# Patient Record
Sex: Female | Born: 1945
Health system: Southern US, Community
[De-identification: ages and names within clinical notes are randomized; demographics above are authoritative.]

## PROBLEM LIST (undated history)

## (undated) DIAGNOSIS — H9190 Unspecified hearing loss, unspecified ear: Secondary | ICD-10-CM

## (undated) DIAGNOSIS — G43009 Migraine without aura, not intractable, without status migrainosus: Secondary | ICD-10-CM

## (undated) DIAGNOSIS — N189 Chronic kidney disease, unspecified: Secondary | ICD-10-CM

## (undated) DIAGNOSIS — K635 Polyp of colon: Secondary | ICD-10-CM

## (undated) DIAGNOSIS — F17200 Nicotine dependence, unspecified, uncomplicated: Secondary | ICD-10-CM

## (undated) DIAGNOSIS — E785 Hyperlipidemia, unspecified: Secondary | ICD-10-CM

## (undated) DIAGNOSIS — R945 Abnormal results of liver function studies: Secondary | ICD-10-CM

## (undated) DIAGNOSIS — Z9889 Other specified postprocedural states: Secondary | ICD-10-CM

## (undated) DIAGNOSIS — Z95810 Presence of automatic (implantable) cardiac defibrillator: Secondary | ICD-10-CM

## (undated) DIAGNOSIS — F32A Depression, unspecified: Secondary | ICD-10-CM

## (undated) DIAGNOSIS — N289 Disorder of kidney and ureter, unspecified: Secondary | ICD-10-CM

## (undated) DIAGNOSIS — R112 Nausea with vomiting, unspecified: Secondary | ICD-10-CM

## (undated) DIAGNOSIS — K317 Polyp of stomach and duodenum: Secondary | ICD-10-CM

## (undated) DIAGNOSIS — I5022 Chronic systolic (congestive) heart failure: Secondary | ICD-10-CM

## (undated) DIAGNOSIS — J189 Pneumonia, unspecified organism: Secondary | ICD-10-CM

## (undated) DIAGNOSIS — D649 Anemia, unspecified: Secondary | ICD-10-CM

## (undated) DIAGNOSIS — H7090 Unspecified mastoiditis, unspecified ear: Secondary | ICD-10-CM

## (undated) DIAGNOSIS — R7303 Prediabetes: Secondary | ICD-10-CM

## (undated) DIAGNOSIS — J45909 Unspecified asthma, uncomplicated: Secondary | ICD-10-CM

## (undated) DIAGNOSIS — K219 Gastro-esophageal reflux disease without esophagitis: Secondary | ICD-10-CM

## (undated) DIAGNOSIS — J449 Chronic obstructive pulmonary disease, unspecified: Secondary | ICD-10-CM

## (undated) DIAGNOSIS — I7122 Aneurysm of the aortic arch, without rupture: Secondary | ICD-10-CM

## (undated) DIAGNOSIS — I712 Thoracic aortic aneurysm, without rupture: Secondary | ICD-10-CM

## (undated) DIAGNOSIS — Z9289 Personal history of other medical treatment: Secondary | ICD-10-CM

## (undated) DIAGNOSIS — I255 Ischemic cardiomyopathy: Secondary | ICD-10-CM

## (undated) DIAGNOSIS — R933 Abnormal findings on diagnostic imaging of other parts of digestive tract: Secondary | ICD-10-CM

## (undated) DIAGNOSIS — R51 Headache: Secondary | ICD-10-CM

## (undated) DIAGNOSIS — I10 Essential (primary) hypertension: Secondary | ICD-10-CM

## (undated) DIAGNOSIS — T8859XA Other complications of anesthesia, initial encounter: Secondary | ICD-10-CM

## (undated) DIAGNOSIS — I639 Cerebral infarction, unspecified: Secondary | ICD-10-CM

## (undated) DIAGNOSIS — K922 Gastrointestinal hemorrhage, unspecified: Secondary | ICD-10-CM

## (undated) DIAGNOSIS — R413 Other amnesia: Secondary | ICD-10-CM

## (undated) DIAGNOSIS — R0600 Dyspnea, unspecified: Secondary | ICD-10-CM

## (undated) DIAGNOSIS — K5730 Diverticulosis of large intestine without perforation or abscess without bleeding: Secondary | ICD-10-CM

## (undated) DIAGNOSIS — Z86711 Personal history of pulmonary embolism: Secondary | ICD-10-CM

## (undated) DIAGNOSIS — G4700 Insomnia, unspecified: Secondary | ICD-10-CM

## (undated) DIAGNOSIS — K589 Irritable bowel syndrome without diarrhea: Secondary | ICD-10-CM

## (undated) DIAGNOSIS — M1990 Unspecified osteoarthritis, unspecified site: Secondary | ICD-10-CM

## (undated) DIAGNOSIS — R1310 Dysphagia, unspecified: Secondary | ICD-10-CM

## (undated) DIAGNOSIS — Z86010 Personal history of colonic polyps: Secondary | ICD-10-CM

## (undated) DIAGNOSIS — I2510 Atherosclerotic heart disease of native coronary artery without angina pectoris: Secondary | ICD-10-CM

## (undated) DIAGNOSIS — T4145XA Adverse effect of unspecified anesthetic, initial encounter: Secondary | ICD-10-CM

## (undated) DIAGNOSIS — K5790 Diverticulosis of intestine, part unspecified, without perforation or abscess without bleeding: Secondary | ICD-10-CM

## (undated) DIAGNOSIS — I219 Acute myocardial infarction, unspecified: Secondary | ICD-10-CM

## (undated) DIAGNOSIS — D126 Benign neoplasm of colon, unspecified: Secondary | ICD-10-CM

## (undated) DIAGNOSIS — F329 Major depressive disorder, single episode, unspecified: Secondary | ICD-10-CM

## (undated) DIAGNOSIS — I2699 Other pulmonary embolism without acute cor pulmonale: Secondary | ICD-10-CM

## (undated) DIAGNOSIS — R1032 Left lower quadrant pain: Secondary | ICD-10-CM

## (undated) DIAGNOSIS — M5136 Other intervertebral disc degeneration, lumbar region: Secondary | ICD-10-CM

## (undated) HISTORY — DX: Insomnia, unspecified: G47.00

## (undated) HISTORY — DX: Nicotine dependence, unspecified, uncomplicated: F17.200

## (undated) HISTORY — PX: INNER EAR SURGERY: SHX679

## (undated) HISTORY — DX: Dysphagia, unspecified: R13.10

## (undated) HISTORY — DX: Essential (primary) hypertension: I10

## (undated) HISTORY — DX: Abnormal findings on diagnostic imaging of other parts of digestive tract: R93.3

## (undated) HISTORY — DX: Other amnesia: R41.3

## (undated) HISTORY — DX: Diverticulosis of large intestine without perforation or abscess without bleeding: K57.30

## (undated) HISTORY — DX: Presence of automatic (implantable) cardiac defibrillator: Z95.810

## (undated) HISTORY — DX: Headache: R51

## (undated) HISTORY — DX: Chronic obstructive pulmonary disease, unspecified: J44.9

## (undated) HISTORY — DX: Hyperlipidemia, unspecified: E78.5

## (undated) HISTORY — DX: Chronic systolic (congestive) heart failure: I50.22

## (undated) HISTORY — DX: Diverticulosis of intestine, part unspecified, without perforation or abscess without bleeding: K57.90

## (undated) HISTORY — DX: Migraine without aura, not intractable, without status migrainosus: G43.009

## (undated) HISTORY — DX: Atherosclerotic heart disease of native coronary artery without angina pectoris: I25.10

## (undated) HISTORY — DX: Ischemic cardiomyopathy: I25.5

## (undated) HISTORY — PX: NASAL SEPTUM SURGERY: SHX37

## (undated) HISTORY — DX: Unspecified mastoiditis, unspecified ear: H70.90

## (undated) HISTORY — DX: Gastrointestinal hemorrhage, unspecified: K92.2

## (undated) HISTORY — DX: Other intervertebral disc degeneration, lumbar region: M51.36

## (undated) HISTORY — DX: Anemia, unspecified: D64.9

## (undated) HISTORY — DX: Disorder of kidney and ureter, unspecified: N28.9

## (undated) HISTORY — DX: Irritable bowel syndrome, unspecified: K58.9

## (undated) HISTORY — DX: Aneurysm of the aortic arch, without rupture: I71.22

## (undated) HISTORY — DX: Other pulmonary embolism without acute cor pulmonale: I26.99

## (undated) HISTORY — DX: Thoracic aortic aneurysm, without rupture: I71.2

## (undated) HISTORY — DX: Benign neoplasm of colon, unspecified: D12.6

## (undated) HISTORY — DX: Abnormal results of liver function studies: R94.5

## (undated) HISTORY — DX: Personal history of pulmonary embolism: Z86.711

## (undated) HISTORY — DX: Left lower quadrant pain: R10.32

## (undated) HISTORY — DX: Polyp of colon: K63.5

## (undated) HISTORY — PX: ANGIOPLASTY: SHX39

## (undated) HISTORY — PX: BILATERAL SALPINGOOPHORECTOMY: SHX1223

## (undated) HISTORY — DX: Gastro-esophageal reflux disease without esophagitis: K21.9

## (undated) HISTORY — PX: TOTAL ABDOMINAL HYSTERECTOMY: SHX209

## (undated) HISTORY — DX: Chronic kidney disease, unspecified: N18.9

## (undated) HISTORY — DX: Personal history of colonic polyps: Z86.010

## (undated) HISTORY — DX: Polyp of stomach and duodenum: K31.7

## (undated) SURGERY — Surgical Case
Anesthesia: *Unknown

---

## 1998-03-09 ENCOUNTER — Emergency Department (HOSPITAL_COMMUNITY): Admission: EM | Admit: 1998-03-09 | Discharge: 1998-03-09 | Payer: Self-pay | Admitting: Family Medicine

## 1998-03-23 ENCOUNTER — Ambulatory Visit (HOSPITAL_COMMUNITY): Admission: RE | Admit: 1998-03-23 | Discharge: 1998-03-23 | Payer: Self-pay | Admitting: Gastroenterology

## 1998-05-03 ENCOUNTER — Emergency Department (HOSPITAL_COMMUNITY): Admission: EM | Admit: 1998-05-03 | Discharge: 1998-05-03 | Payer: Self-pay | Admitting: Emergency Medicine

## 1998-06-07 ENCOUNTER — Other Ambulatory Visit: Admission: RE | Admit: 1998-06-07 | Discharge: 1998-06-07 | Payer: Self-pay | Admitting: *Deleted

## 1998-07-31 ENCOUNTER — Ambulatory Visit (HOSPITAL_COMMUNITY): Admission: RE | Admit: 1998-07-31 | Discharge: 1998-07-31 | Payer: Self-pay | Admitting: *Deleted

## 1999-05-16 ENCOUNTER — Ambulatory Visit (HOSPITAL_COMMUNITY): Admission: RE | Admit: 1999-05-16 | Discharge: 1999-05-16 | Payer: Self-pay | Admitting: *Deleted

## 1999-06-02 ENCOUNTER — Emergency Department (HOSPITAL_COMMUNITY): Admission: EM | Admit: 1999-06-02 | Discharge: 1999-06-02 | Payer: Self-pay | Admitting: Emergency Medicine

## 1999-10-01 ENCOUNTER — Ambulatory Visit (HOSPITAL_COMMUNITY): Admission: RE | Admit: 1999-10-01 | Discharge: 1999-10-01 | Payer: Self-pay | Admitting: *Deleted

## 2000-01-20 ENCOUNTER — Other Ambulatory Visit: Admission: RE | Admit: 2000-01-20 | Discharge: 2000-01-20 | Payer: Self-pay | Admitting: *Deleted

## 2001-02-15 ENCOUNTER — Emergency Department (HOSPITAL_COMMUNITY): Admission: EM | Admit: 2001-02-15 | Discharge: 2001-02-15 | Payer: Self-pay | Admitting: Emergency Medicine

## 2001-07-30 ENCOUNTER — Ambulatory Visit (HOSPITAL_COMMUNITY): Admission: RE | Admit: 2001-07-30 | Discharge: 2001-07-30 | Payer: Self-pay | Admitting: Neurology

## 2001-08-20 ENCOUNTER — Emergency Department (HOSPITAL_COMMUNITY): Admission: EM | Admit: 2001-08-20 | Discharge: 2001-08-20 | Payer: Self-pay | Admitting: Emergency Medicine

## 2001-08-20 ENCOUNTER — Encounter: Payer: Self-pay | Admitting: Emergency Medicine

## 2001-11-22 ENCOUNTER — Encounter: Admission: RE | Admit: 2001-11-22 | Discharge: 2001-11-22 | Payer: Self-pay | Admitting: Internal Medicine

## 2001-11-22 ENCOUNTER — Encounter: Payer: Self-pay | Admitting: Internal Medicine

## 2001-12-29 ENCOUNTER — Emergency Department (HOSPITAL_COMMUNITY): Admission: EM | Admit: 2001-12-29 | Discharge: 2001-12-29 | Payer: Self-pay | Admitting: *Deleted

## 2002-06-26 ENCOUNTER — Encounter: Payer: Self-pay | Admitting: Emergency Medicine

## 2002-06-26 ENCOUNTER — Emergency Department (HOSPITAL_COMMUNITY): Admission: EM | Admit: 2002-06-26 | Discharge: 2002-06-26 | Payer: Self-pay | Admitting: Emergency Medicine

## 2002-07-07 ENCOUNTER — Other Ambulatory Visit: Admission: RE | Admit: 2002-07-07 | Discharge: 2002-07-07 | Payer: Self-pay | Admitting: Obstetrics and Gynecology

## 2002-08-02 ENCOUNTER — Ambulatory Visit (HOSPITAL_COMMUNITY): Admission: RE | Admit: 2002-08-02 | Discharge: 2002-08-02 | Payer: Self-pay | Admitting: Obstetrics and Gynecology

## 2002-08-02 ENCOUNTER — Encounter: Payer: Self-pay | Admitting: Obstetrics and Gynecology

## 2003-05-20 ENCOUNTER — Encounter: Admission: RE | Admit: 2003-05-20 | Discharge: 2003-05-20 | Payer: Self-pay | Admitting: Internal Medicine

## 2003-05-20 ENCOUNTER — Encounter: Payer: Self-pay | Admitting: Internal Medicine

## 2003-06-13 ENCOUNTER — Encounter: Admission: RE | Admit: 2003-06-13 | Discharge: 2003-07-12 | Payer: Self-pay | Admitting: Orthopedic Surgery

## 2003-06-28 ENCOUNTER — Encounter: Admission: RE | Admit: 2003-06-28 | Discharge: 2003-06-28 | Payer: Self-pay | Admitting: Orthopedic Surgery

## 2003-07-12 ENCOUNTER — Encounter: Admission: RE | Admit: 2003-07-12 | Discharge: 2003-07-12 | Payer: Self-pay | Admitting: Orthopedic Surgery

## 2003-09-13 ENCOUNTER — Encounter: Admission: RE | Admit: 2003-09-13 | Discharge: 2003-09-13 | Payer: Self-pay | Admitting: Orthopedic Surgery

## 2003-11-14 ENCOUNTER — Other Ambulatory Visit: Admission: RE | Admit: 2003-11-14 | Discharge: 2003-11-14 | Payer: Self-pay | Admitting: Obstetrics and Gynecology

## 2004-02-08 ENCOUNTER — Encounter: Admission: RE | Admit: 2004-02-08 | Discharge: 2004-02-08 | Payer: Self-pay | Admitting: Hematology & Oncology

## 2004-06-03 ENCOUNTER — Encounter: Admission: RE | Admit: 2004-06-03 | Discharge: 2004-06-03 | Payer: Self-pay | Admitting: Internal Medicine

## 2004-07-05 ENCOUNTER — Encounter: Admission: RE | Admit: 2004-07-05 | Discharge: 2004-07-05 | Payer: Self-pay | Admitting: Neurology

## 2004-08-10 ENCOUNTER — Emergency Department (HOSPITAL_COMMUNITY): Admission: EM | Admit: 2004-08-10 | Discharge: 2004-08-10 | Payer: Self-pay | Admitting: Emergency Medicine

## 2004-10-08 ENCOUNTER — Encounter: Admission: RE | Admit: 2004-10-08 | Discharge: 2004-10-08 | Payer: Self-pay | Admitting: Orthopedic Surgery

## 2004-10-25 ENCOUNTER — Encounter: Admission: RE | Admit: 2004-10-25 | Discharge: 2004-10-25 | Payer: Self-pay | Admitting: Orthopedic Surgery

## 2005-01-17 ENCOUNTER — Encounter: Admission: RE | Admit: 2005-01-17 | Discharge: 2005-01-17 | Payer: Self-pay | Admitting: Orthopedic Surgery

## 2005-02-25 ENCOUNTER — Other Ambulatory Visit: Admission: RE | Admit: 2005-02-25 | Discharge: 2005-02-25 | Payer: Self-pay | Admitting: Obstetrics and Gynecology

## 2005-04-15 ENCOUNTER — Ambulatory Visit: Payer: Self-pay | Admitting: Gastroenterology

## 2005-04-17 ENCOUNTER — Ambulatory Visit: Payer: Self-pay | Admitting: Cardiology

## 2005-05-05 ENCOUNTER — Ambulatory Visit: Payer: Self-pay | Admitting: Gastroenterology

## 2005-05-28 ENCOUNTER — Ambulatory Visit: Payer: Self-pay | Admitting: Gastroenterology

## 2005-06-04 ENCOUNTER — Ambulatory Visit: Payer: Self-pay | Admitting: Gastroenterology

## 2005-06-16 ENCOUNTER — Ambulatory Visit: Payer: Self-pay | Admitting: Gastroenterology

## 2005-07-10 ENCOUNTER — Encounter: Admission: RE | Admit: 2005-07-10 | Discharge: 2005-07-10 | Payer: Self-pay | Admitting: Pediatrics

## 2005-08-07 ENCOUNTER — Ambulatory Visit: Payer: Self-pay | Admitting: Gastroenterology

## 2005-09-04 ENCOUNTER — Ambulatory Visit: Payer: Self-pay | Admitting: Gastroenterology

## 2005-12-04 ENCOUNTER — Ambulatory Visit: Payer: Self-pay | Admitting: Gastroenterology

## 2006-11-09 ENCOUNTER — Ambulatory Visit (HOSPITAL_COMMUNITY): Admission: RE | Admit: 2006-11-09 | Discharge: 2006-11-09 | Payer: Self-pay | Admitting: Obstetrics and Gynecology

## 2007-02-26 HISTORY — PX: CERVICAL SPINE SURGERY: SHX589

## 2007-04-07 ENCOUNTER — Ambulatory Visit: Payer: Self-pay | Admitting: Gastroenterology

## 2007-05-12 ENCOUNTER — Ambulatory Visit: Payer: Self-pay | Admitting: Gastroenterology

## 2007-05-12 ENCOUNTER — Encounter: Payer: Self-pay | Admitting: Gastroenterology

## 2007-05-12 ENCOUNTER — Encounter (INDEPENDENT_AMBULATORY_CARE_PROVIDER_SITE_OTHER): Payer: Self-pay | Admitting: *Deleted

## 2007-05-12 DIAGNOSIS — D126 Benign neoplasm of colon, unspecified: Secondary | ICD-10-CM

## 2007-05-17 ENCOUNTER — Encounter: Admission: RE | Admit: 2007-05-17 | Discharge: 2007-05-17 | Payer: Self-pay | Admitting: Internal Medicine

## 2007-05-29 ENCOUNTER — Encounter: Admission: RE | Admit: 2007-05-29 | Discharge: 2007-05-29 | Payer: Self-pay | Admitting: Orthopedic Surgery

## 2007-07-02 ENCOUNTER — Inpatient Hospital Stay (HOSPITAL_COMMUNITY): Admission: RE | Admit: 2007-07-02 | Discharge: 2007-07-03 | Payer: Self-pay | Admitting: Neurosurgery

## 2007-09-06 ENCOUNTER — Encounter: Admission: RE | Admit: 2007-09-06 | Discharge: 2007-09-06 | Payer: Self-pay | Admitting: Neurosurgery

## 2007-10-19 DIAGNOSIS — K219 Gastro-esophageal reflux disease without esophagitis: Secondary | ICD-10-CM | POA: Insufficient documentation

## 2007-10-19 DIAGNOSIS — H7090 Unspecified mastoiditis, unspecified ear: Secondary | ICD-10-CM | POA: Insufficient documentation

## 2007-10-19 DIAGNOSIS — K589 Irritable bowel syndrome without diarrhea: Secondary | ICD-10-CM | POA: Insufficient documentation

## 2007-10-19 DIAGNOSIS — K5900 Constipation, unspecified: Secondary | ICD-10-CM | POA: Insufficient documentation

## 2007-10-19 HISTORY — DX: Gastro-esophageal reflux disease without esophagitis: K21.9

## 2008-02-26 HISTORY — PX: LUMBAR DISC SURGERY: SHX700

## 2008-04-26 ENCOUNTER — Inpatient Hospital Stay (HOSPITAL_COMMUNITY): Admission: RE | Admit: 2008-04-26 | Discharge: 2008-04-29 | Payer: Self-pay | Admitting: Neurosurgery

## 2008-05-04 ENCOUNTER — Encounter: Admission: RE | Admit: 2008-05-04 | Discharge: 2008-05-04 | Payer: Self-pay | Admitting: Neurosurgery

## 2008-05-11 ENCOUNTER — Emergency Department (HOSPITAL_COMMUNITY): Admission: EM | Admit: 2008-05-11 | Discharge: 2008-05-12 | Payer: Self-pay | Admitting: Emergency Medicine

## 2008-07-31 ENCOUNTER — Encounter: Admission: RE | Admit: 2008-07-31 | Discharge: 2008-07-31 | Payer: Self-pay | Admitting: Neurosurgery

## 2008-10-11 ENCOUNTER — Telehealth: Payer: Self-pay | Admitting: Gastroenterology

## 2008-11-28 ENCOUNTER — Encounter: Admission: RE | Admit: 2008-11-28 | Discharge: 2008-11-28 | Payer: Self-pay | Admitting: Neurosurgery

## 2008-12-04 ENCOUNTER — Ambulatory Visit: Payer: Self-pay | Admitting: Gastroenterology

## 2008-12-04 DIAGNOSIS — R1013 Epigastric pain: Secondary | ICD-10-CM

## 2008-12-04 DIAGNOSIS — K3189 Other diseases of stomach and duodenum: Secondary | ICD-10-CM

## 2009-02-25 DIAGNOSIS — I2510 Atherosclerotic heart disease of native coronary artery without angina pectoris: Secondary | ICD-10-CM | POA: Insufficient documentation

## 2009-02-25 DIAGNOSIS — I25119 Atherosclerotic heart disease of native coronary artery with unspecified angina pectoris: Secondary | ICD-10-CM | POA: Insufficient documentation

## 2009-02-25 HISTORY — DX: Atherosclerotic heart disease of native coronary artery without angina pectoris: I25.10

## 2009-02-25 HISTORY — PX: OTHER SURGICAL HISTORY: SHX169

## 2009-03-07 ENCOUNTER — Ambulatory Visit: Payer: Self-pay | Admitting: Cardiology

## 2009-03-07 ENCOUNTER — Inpatient Hospital Stay (HOSPITAL_COMMUNITY): Admission: EM | Admit: 2009-03-07 | Discharge: 2009-03-10 | Payer: Self-pay | Admitting: Emergency Medicine

## 2009-03-08 ENCOUNTER — Encounter: Payer: Self-pay | Admitting: Cardiology

## 2009-03-20 ENCOUNTER — Telehealth: Payer: Self-pay | Admitting: Cardiology

## 2009-03-27 ENCOUNTER — Telehealth: Payer: Self-pay | Admitting: Cardiology

## 2009-04-04 ENCOUNTER — Encounter: Payer: Self-pay | Admitting: Physician Assistant

## 2009-04-04 ENCOUNTER — Ambulatory Visit: Payer: Self-pay | Admitting: Internal Medicine

## 2009-04-04 DIAGNOSIS — I10 Essential (primary) hypertension: Secondary | ICD-10-CM | POA: Insufficient documentation

## 2009-04-04 DIAGNOSIS — R079 Chest pain, unspecified: Secondary | ICD-10-CM

## 2009-04-04 DIAGNOSIS — I2510 Atherosclerotic heart disease of native coronary artery without angina pectoris: Secondary | ICD-10-CM | POA: Insufficient documentation

## 2009-04-04 DIAGNOSIS — F17200 Nicotine dependence, unspecified, uncomplicated: Secondary | ICD-10-CM | POA: Insufficient documentation

## 2009-04-04 HISTORY — DX: Nicotine dependence, unspecified, uncomplicated: F17.200

## 2009-04-24 ENCOUNTER — Ambulatory Visit: Payer: Self-pay | Admitting: Gastroenterology

## 2009-04-24 DIAGNOSIS — R1310 Dysphagia, unspecified: Secondary | ICD-10-CM

## 2009-04-24 DIAGNOSIS — I2510 Atherosclerotic heart disease of native coronary artery without angina pectoris: Secondary | ICD-10-CM

## 2009-04-24 DIAGNOSIS — I219 Acute myocardial infarction, unspecified: Secondary | ICD-10-CM | POA: Insufficient documentation

## 2009-04-24 DIAGNOSIS — Z860100 Personal history of colon polyps, unspecified: Secondary | ICD-10-CM

## 2009-04-24 DIAGNOSIS — R110 Nausea: Secondary | ICD-10-CM

## 2009-04-24 DIAGNOSIS — E785 Hyperlipidemia, unspecified: Secondary | ICD-10-CM | POA: Insufficient documentation

## 2009-04-24 HISTORY — DX: Atherosclerotic heart disease of native coronary artery without angina pectoris: I25.10

## 2009-04-24 HISTORY — DX: Hyperlipidemia, unspecified: E78.5

## 2009-04-24 HISTORY — DX: Personal history of colon polyps, unspecified: Z86.0100

## 2009-04-24 HISTORY — DX: Personal history of colonic polyps: Z86.010

## 2009-04-24 HISTORY — DX: Dysphagia, unspecified: R13.10

## 2009-04-25 ENCOUNTER — Telehealth: Payer: Self-pay | Admitting: Cardiology

## 2009-04-26 ENCOUNTER — Encounter: Payer: Self-pay | Admitting: Cardiology

## 2009-04-27 LAB — CONVERTED CEMR LAB
Albumin: 4 g/dL (ref 3.5–5.2)
Alkaline Phosphatase: 101 units/L (ref 39–117)
CO2: 31 meq/L (ref 19–32)
Chloride: 111 meq/L (ref 96–112)
Eosinophils Absolute: 0.3 10*3/uL (ref 0.0–0.7)
Eosinophils Relative: 3.9 % (ref 0.0–5.0)
Glucose, Bld: 108 mg/dL — ABNORMAL HIGH (ref 70–99)
MCV: 96 fL (ref 78.0–100.0)
Monocytes Absolute: 0.5 10*3/uL (ref 0.1–1.0)
Neutrophils Relative %: 65.9 % (ref 43.0–77.0)
Platelets: 248 10*3/uL (ref 150.0–400.0)
Potassium: 3.9 meq/L (ref 3.5–5.1)
Sodium: 144 meq/L (ref 135–145)
Total Protein: 7.3 g/dL (ref 6.0–8.3)
WBC: 8.3 10*3/uL (ref 4.5–10.5)

## 2009-04-30 ENCOUNTER — Ambulatory Visit (HOSPITAL_COMMUNITY): Admission: RE | Admit: 2009-04-30 | Discharge: 2009-04-30 | Payer: Self-pay | Admitting: Gastroenterology

## 2009-05-01 ENCOUNTER — Telehealth: Payer: Self-pay | Admitting: Physician Assistant

## 2009-05-02 ENCOUNTER — Telehealth: Payer: Self-pay | Admitting: Gastroenterology

## 2009-05-03 ENCOUNTER — Ambulatory Visit: Payer: Self-pay

## 2009-05-03 ENCOUNTER — Ambulatory Visit: Payer: Self-pay | Admitting: Cardiology

## 2009-05-07 ENCOUNTER — Telehealth: Payer: Self-pay | Admitting: Cardiology

## 2009-05-07 LAB — CONVERTED CEMR LAB
AST: 19 units/L (ref 0–37)
Albumin: 4.2 g/dL (ref 3.5–5.2)
Alkaline Phosphatase: 107 units/L (ref 39–117)
Cholesterol: 139 mg/dL (ref 0–200)
Total Protein: 8.3 g/dL (ref 6.0–8.3)
Triglycerides: 194 mg/dL — ABNORMAL HIGH (ref 0.0–149.0)

## 2009-05-08 ENCOUNTER — Encounter: Admission: RE | Admit: 2009-05-08 | Discharge: 2009-05-08 | Payer: Self-pay | Admitting: Neurosurgery

## 2009-05-08 ENCOUNTER — Ambulatory Visit: Payer: Self-pay | Admitting: Gastroenterology

## 2009-05-15 ENCOUNTER — Encounter: Payer: Self-pay | Admitting: Gastroenterology

## 2009-05-15 ENCOUNTER — Ambulatory Visit: Payer: Self-pay | Admitting: Gastroenterology

## 2009-05-17 ENCOUNTER — Encounter: Payer: Self-pay | Admitting: Cardiology

## 2009-05-18 ENCOUNTER — Encounter: Payer: Self-pay | Admitting: Gastroenterology

## 2009-06-13 ENCOUNTER — Ambulatory Visit: Payer: Self-pay | Admitting: Cardiology

## 2009-06-20 ENCOUNTER — Telehealth (INDEPENDENT_AMBULATORY_CARE_PROVIDER_SITE_OTHER): Payer: Self-pay | Admitting: *Deleted

## 2009-06-25 ENCOUNTER — Ambulatory Visit: Payer: Self-pay | Admitting: Cardiology

## 2009-06-25 ENCOUNTER — Encounter (HOSPITAL_COMMUNITY): Admission: RE | Admit: 2009-06-25 | Discharge: 2009-07-25 | Payer: Self-pay | Admitting: Cardiology

## 2009-06-25 ENCOUNTER — Ambulatory Visit: Payer: Self-pay

## 2009-06-27 ENCOUNTER — Encounter (INDEPENDENT_AMBULATORY_CARE_PROVIDER_SITE_OTHER): Payer: Self-pay

## 2009-06-27 ENCOUNTER — Ambulatory Visit: Payer: Self-pay | Admitting: Cardiology

## 2009-06-29 ENCOUNTER — Inpatient Hospital Stay (HOSPITAL_COMMUNITY): Admission: AD | Admit: 2009-06-29 | Discharge: 2009-07-03 | Payer: Self-pay | Admitting: Cardiology

## 2009-06-29 ENCOUNTER — Ambulatory Visit: Payer: Self-pay | Admitting: Cardiology

## 2009-07-04 ENCOUNTER — Encounter: Payer: Self-pay | Admitting: Cardiology

## 2009-07-06 ENCOUNTER — Telehealth: Payer: Self-pay | Admitting: Cardiology

## 2009-07-06 ENCOUNTER — Inpatient Hospital Stay (HOSPITAL_COMMUNITY): Admission: EM | Admit: 2009-07-06 | Discharge: 2009-07-09 | Payer: Self-pay | Admitting: Emergency Medicine

## 2009-07-06 ENCOUNTER — Ambulatory Visit: Payer: Self-pay | Admitting: Cardiology

## 2009-07-06 ENCOUNTER — Encounter: Payer: Self-pay | Admitting: Cardiology

## 2009-07-09 ENCOUNTER — Encounter: Payer: Self-pay | Admitting: Cardiology

## 2009-07-16 ENCOUNTER — Telehealth: Payer: Self-pay | Admitting: Cardiology

## 2009-07-28 DIAGNOSIS — I2699 Other pulmonary embolism without acute cor pulmonale: Secondary | ICD-10-CM

## 2009-07-28 HISTORY — DX: Other pulmonary embolism without acute cor pulmonale: I26.99

## 2009-07-31 ENCOUNTER — Ambulatory Visit (HOSPITAL_COMMUNITY): Admission: RE | Admit: 2009-07-31 | Discharge: 2009-07-31 | Payer: Self-pay | Admitting: Cardiology

## 2009-07-31 ENCOUNTER — Encounter: Payer: Self-pay | Admitting: Cardiology

## 2009-07-31 ENCOUNTER — Ambulatory Visit: Payer: Self-pay | Admitting: Cardiovascular Disease

## 2009-07-31 ENCOUNTER — Ambulatory Visit: Payer: Self-pay | Admitting: Cardiology

## 2009-08-01 ENCOUNTER — Ambulatory Visit: Payer: Self-pay | Admitting: Cardiology

## 2009-08-02 ENCOUNTER — Encounter: Payer: Self-pay | Admitting: Cardiology

## 2009-08-06 ENCOUNTER — Encounter: Payer: Self-pay | Admitting: Cardiology

## 2009-08-10 ENCOUNTER — Ambulatory Visit: Payer: Self-pay | Admitting: Cardiology

## 2009-08-13 ENCOUNTER — Emergency Department (HOSPITAL_COMMUNITY): Admission: EM | Admit: 2009-08-13 | Discharge: 2009-08-13 | Payer: Self-pay | Admitting: Emergency Medicine

## 2009-08-13 ENCOUNTER — Telehealth: Payer: Self-pay | Admitting: Cardiology

## 2009-08-13 ENCOUNTER — Telehealth: Payer: Self-pay | Admitting: Gastroenterology

## 2009-08-13 ENCOUNTER — Ambulatory Visit: Payer: Self-pay | Admitting: Internal Medicine

## 2009-08-13 LAB — CONVERTED CEMR LAB
AST: 18 units/L (ref 0–37)
Albumin: 4.4 g/dL (ref 3.5–5.2)
Alkaline Phosphatase: 93 units/L (ref 39–117)
CO2: 14 meq/L — ABNORMAL LOW (ref 19–32)
Calcium: 9.7 mg/dL (ref 8.4–10.5)
Creatinine, Ser: 1.07 mg/dL (ref 0.40–1.20)
HDL: 54 mg/dL (ref >39)
Total Bilirubin: 0.3 mg/dL (ref 0.3–1.2)
Triglycerides: 87 mg/dL (ref <150)

## 2009-08-14 ENCOUNTER — Ambulatory Visit: Payer: Self-pay | Admitting: Gastroenterology

## 2009-08-14 DIAGNOSIS — K5730 Diverticulosis of large intestine without perforation or abscess without bleeding: Secondary | ICD-10-CM

## 2009-08-14 DIAGNOSIS — R1032 Left lower quadrant pain: Secondary | ICD-10-CM | POA: Insufficient documentation

## 2009-08-14 DIAGNOSIS — R1084 Generalized abdominal pain: Secondary | ICD-10-CM | POA: Insufficient documentation

## 2009-08-14 HISTORY — DX: Diverticulosis of large intestine without perforation or abscess without bleeding: K57.30

## 2009-08-14 HISTORY — DX: Left lower quadrant pain: R10.32

## 2009-08-15 ENCOUNTER — Telehealth (INDEPENDENT_AMBULATORY_CARE_PROVIDER_SITE_OTHER): Payer: Self-pay | Admitting: *Deleted

## 2009-08-15 LAB — CONVERTED CEMR LAB
Basophils Absolute: 0 10*3/uL (ref 0.0–0.1)
Basophils Relative: 0.4 % (ref 0.0–3.0)
Eosinophils Absolute: 0.3 10*3/uL (ref 0.0–0.7)
Eosinophils Relative: 4.9 % (ref 0.0–5.0)
HCT: 37.3 % (ref 36.0–46.0)
Hemoglobin: 12.4 g/dL (ref 12.0–15.0)
Lymphocytes Relative: 43.9 % (ref 12.0–46.0)
Lymphs Abs: 3 10*3/uL (ref 0.7–4.0)
MCHC: 33.2 g/dL (ref 30.0–36.0)
MCV: 98.5 fL (ref 78.0–100.0)
Monocytes Absolute: 0.4 10*3/uL (ref 0.1–1.0)
Monocytes Relative: 5.4 % (ref 3.0–12.0)
Neutro Abs: 3.2 10*3/uL (ref 1.4–7.7)
Neutrophils Relative %: 45.4 % (ref 43.0–77.0)
Platelets: 219 10*3/uL (ref 150.0–400.0)
RBC: 3.79 M/uL — ABNORMAL LOW (ref 3.87–5.11)
RDW: 13.7 % (ref 11.5–14.6)
WBC: 6.9 10*3/uL (ref 4.5–10.5)

## 2009-08-17 ENCOUNTER — Telehealth: Payer: Self-pay | Admitting: Cardiology

## 2009-08-20 ENCOUNTER — Telehealth: Payer: Self-pay | Admitting: Physician Assistant

## 2009-08-30 ENCOUNTER — Ambulatory Visit: Payer: Self-pay | Admitting: Cardiology

## 2009-08-30 ENCOUNTER — Ambulatory Visit (HOSPITAL_COMMUNITY): Admission: RE | Admit: 2009-08-30 | Discharge: 2009-08-30 | Payer: Self-pay | Admitting: Cardiology

## 2009-08-30 DIAGNOSIS — R0602 Shortness of breath: Secondary | ICD-10-CM | POA: Insufficient documentation

## 2009-09-04 ENCOUNTER — Ambulatory Visit: Payer: Self-pay | Admitting: Cardiology

## 2009-09-05 ENCOUNTER — Ambulatory Visit: Payer: Self-pay | Admitting: Internal Medicine

## 2009-09-05 DIAGNOSIS — I10 Essential (primary) hypertension: Secondary | ICD-10-CM | POA: Insufficient documentation

## 2009-09-05 HISTORY — DX: Essential (primary) hypertension: I10

## 2009-09-10 ENCOUNTER — Ambulatory Visit: Payer: Self-pay | Admitting: Internal Medicine

## 2009-09-12 ENCOUNTER — Telehealth: Payer: Self-pay | Admitting: Internal Medicine

## 2009-09-24 ENCOUNTER — Ambulatory Visit: Payer: Self-pay | Admitting: Internal Medicine

## 2009-09-24 LAB — CONVERTED CEMR LAB
Chloride: 113 meq/L — ABNORMAL HIGH (ref 96–112)
GFR calc non Af Amer: 53.47 mL/min (ref >60)
Potassium: 4.9 meq/L (ref 3.5–5.1)
Sodium: 142 meq/L (ref 135–145)

## 2009-10-01 ENCOUNTER — Telehealth: Payer: Self-pay | Admitting: Internal Medicine

## 2009-10-12 ENCOUNTER — Telehealth: Payer: Self-pay | Admitting: Internal Medicine

## 2009-10-24 ENCOUNTER — Emergency Department (HOSPITAL_COMMUNITY): Admission: EM | Admit: 2009-10-24 | Discharge: 2009-10-24 | Payer: Self-pay | Admitting: Emergency Medicine

## 2009-11-13 ENCOUNTER — Ambulatory Visit: Payer: Self-pay | Admitting: Cardiology

## 2009-12-04 ENCOUNTER — Ambulatory Visit: Payer: Self-pay | Admitting: Internal Medicine

## 2009-12-10 ENCOUNTER — Telehealth (INDEPENDENT_AMBULATORY_CARE_PROVIDER_SITE_OTHER): Payer: Self-pay | Admitting: *Deleted

## 2009-12-27 ENCOUNTER — Ambulatory Visit: Payer: Self-pay | Admitting: Cardiology

## 2010-01-01 ENCOUNTER — Telehealth: Payer: Self-pay | Admitting: Cardiology

## 2010-01-02 ENCOUNTER — Encounter: Payer: Self-pay | Admitting: Internal Medicine

## 2010-01-02 ENCOUNTER — Ambulatory Visit (HOSPITAL_COMMUNITY): Admission: RE | Admit: 2010-01-02 | Discharge: 2010-01-02 | Payer: Self-pay | Admitting: Internal Medicine

## 2010-01-07 ENCOUNTER — Encounter: Admission: RE | Admit: 2010-01-07 | Discharge: 2010-01-07 | Payer: Self-pay | Admitting: Neurosurgery

## 2010-01-14 ENCOUNTER — Ambulatory Visit: Payer: Self-pay | Admitting: Internal Medicine

## 2010-01-15 ENCOUNTER — Ambulatory Visit: Payer: Self-pay | Admitting: Cardiology

## 2010-01-21 LAB — CONVERTED CEMR LAB
CO2: 22 meq/L (ref 19–32)
Chloride: 112 meq/L (ref 96–112)
Glucose, Bld: 98 mg/dL (ref 70–99)
Potassium: 3.7 meq/L (ref 3.5–5.1)
Sodium: 143 meq/L (ref 135–145)

## 2010-01-29 ENCOUNTER — Ambulatory Visit: Payer: Self-pay | Admitting: Internal Medicine

## 2010-03-07 ENCOUNTER — Telehealth: Payer: Self-pay | Admitting: Cardiology

## 2010-03-13 ENCOUNTER — Ambulatory Visit: Payer: Self-pay | Admitting: Cardiology

## 2010-03-13 ENCOUNTER — Encounter: Payer: Self-pay | Admitting: Cardiology

## 2010-03-19 ENCOUNTER — Ambulatory Visit: Payer: Self-pay | Admitting: Cardiology

## 2010-03-19 ENCOUNTER — Observation Stay (HOSPITAL_COMMUNITY): Admission: AD | Admit: 2010-03-19 | Discharge: 2010-03-20 | Payer: Self-pay | Admitting: Cardiology

## 2010-03-19 ENCOUNTER — Inpatient Hospital Stay (HOSPITAL_BASED_OUTPATIENT_CLINIC_OR_DEPARTMENT_OTHER): Admission: RE | Admit: 2010-03-19 | Discharge: 2010-03-19 | Payer: Self-pay | Admitting: Cardiology

## 2010-03-27 ENCOUNTER — Encounter: Admission: RE | Admit: 2010-03-27 | Discharge: 2010-03-27 | Payer: Self-pay | Admitting: Obstetrics and Gynecology

## 2010-03-31 ENCOUNTER — Emergency Department (HOSPITAL_COMMUNITY): Admission: EM | Admit: 2010-03-31 | Discharge: 2010-03-31 | Payer: Self-pay | Admitting: Emergency Medicine

## 2010-04-05 ENCOUNTER — Inpatient Hospital Stay (HOSPITAL_COMMUNITY): Admission: EM | Admit: 2010-04-05 | Discharge: 2010-04-16 | Payer: Self-pay | Admitting: Emergency Medicine

## 2010-04-05 ENCOUNTER — Ambulatory Visit: Payer: Self-pay | Admitting: Internal Medicine

## 2010-04-06 ENCOUNTER — Ambulatory Visit: Payer: Self-pay | Admitting: Vascular Surgery

## 2010-04-10 ENCOUNTER — Encounter: Payer: Self-pay | Admitting: Internal Medicine

## 2010-04-19 ENCOUNTER — Encounter: Payer: Self-pay | Admitting: Cardiology

## 2010-04-19 LAB — CONVERTED CEMR LAB: POC INR: 2.4

## 2010-04-23 ENCOUNTER — Telehealth: Payer: Self-pay | Admitting: Cardiology

## 2010-04-25 ENCOUNTER — Encounter: Payer: Self-pay | Admitting: Cardiology

## 2010-04-26 ENCOUNTER — Encounter: Payer: Self-pay | Admitting: Cardiology

## 2010-04-26 LAB — CONVERTED CEMR LAB: Prothrombin Time: 46.1 s

## 2010-05-03 ENCOUNTER — Encounter: Payer: Self-pay | Admitting: Cardiology

## 2010-05-03 LAB — CONVERTED CEMR LAB
POC INR: 3.6
Prothrombin Time: 43.2 s

## 2010-05-10 ENCOUNTER — Telehealth: Payer: Self-pay | Admitting: Cardiology

## 2010-05-10 ENCOUNTER — Encounter: Payer: Self-pay | Admitting: Cardiology

## 2010-05-12 ENCOUNTER — Encounter: Payer: Self-pay | Admitting: Cardiology

## 2010-05-13 ENCOUNTER — Encounter: Payer: Self-pay | Admitting: Cardiology

## 2010-05-13 ENCOUNTER — Ambulatory Visit: Payer: Self-pay | Admitting: Internal Medicine

## 2010-05-13 ENCOUNTER — Inpatient Hospital Stay (HOSPITAL_COMMUNITY): Admission: EM | Admit: 2010-05-13 | Discharge: 2010-05-19 | Payer: Self-pay | Admitting: Emergency Medicine

## 2010-05-14 ENCOUNTER — Ambulatory Visit: Payer: Self-pay | Admitting: Vascular Surgery

## 2010-05-14 ENCOUNTER — Encounter: Payer: Self-pay | Admitting: Cardiology

## 2010-05-17 ENCOUNTER — Encounter: Payer: Self-pay | Admitting: Internal Medicine

## 2010-05-18 ENCOUNTER — Encounter: Payer: Self-pay | Admitting: Internal Medicine

## 2010-05-20 ENCOUNTER — Encounter: Payer: Self-pay | Admitting: Internal Medicine

## 2010-05-20 ENCOUNTER — Encounter: Payer: Self-pay | Admitting: Cardiology

## 2010-05-21 ENCOUNTER — Encounter: Payer: Self-pay | Admitting: Internal Medicine

## 2010-05-28 ENCOUNTER — Encounter (INDEPENDENT_AMBULATORY_CARE_PROVIDER_SITE_OTHER): Payer: Self-pay | Admitting: Orthopedic Surgery

## 2010-05-28 ENCOUNTER — Ambulatory Visit (HOSPITAL_COMMUNITY): Admission: RE | Admit: 2010-05-28 | Discharge: 2010-05-28 | Payer: Self-pay | Admitting: Orthopedic Surgery

## 2010-06-05 ENCOUNTER — Ambulatory Visit: Payer: Self-pay | Admitting: Surgery

## 2010-06-05 ENCOUNTER — Encounter (INDEPENDENT_AMBULATORY_CARE_PROVIDER_SITE_OTHER): Payer: Self-pay | Admitting: Emergency Medicine

## 2010-06-05 ENCOUNTER — Inpatient Hospital Stay (HOSPITAL_COMMUNITY): Admission: EM | Admit: 2010-06-05 | Discharge: 2010-06-08 | Payer: Self-pay | Admitting: Emergency Medicine

## 2010-06-12 ENCOUNTER — Ambulatory Visit: Payer: Self-pay | Admitting: Psychiatry

## 2010-06-24 ENCOUNTER — Encounter: Payer: Self-pay | Admitting: Cardiology

## 2010-06-24 ENCOUNTER — Ambulatory Visit: Payer: Self-pay | Admitting: Cardiology

## 2010-06-26 ENCOUNTER — Encounter: Payer: Self-pay | Admitting: Cardiology

## 2010-07-03 ENCOUNTER — Ambulatory Visit: Payer: Self-pay | Admitting: Cardiology

## 2010-07-03 DIAGNOSIS — D649 Anemia, unspecified: Secondary | ICD-10-CM | POA: Insufficient documentation

## 2010-07-03 HISTORY — DX: Anemia, unspecified: D64.9

## 2010-07-04 LAB — CONVERTED CEMR LAB
BUN: 22 mg/dL (ref 6–23)
Calcium: 9.6 mg/dL (ref 8.4–10.5)
Creatinine, Ser: 1.3 mg/dL — ABNORMAL HIGH (ref 0.4–1.2)
Eosinophils Relative: 1.8 % (ref 0.0–5.0)
GFR calc non Af Amer: 45.19 mL/min — ABNORMAL LOW (ref >60.00)
Lymphocytes Relative: 36 % (ref 12.0–46.0)
MCV: 96.2 fL (ref 78.0–100.0)
Monocytes Absolute: 0.4 10*3/uL (ref 0.1–1.0)
Monocytes Relative: 6.3 % (ref 3.0–12.0)
Neutrophils Relative %: 55.4 % (ref 43.0–77.0)
Platelets: 221 10*3/uL (ref 150.0–400.0)
WBC: 5.7 10*3/uL (ref 4.5–10.5)

## 2010-07-05 ENCOUNTER — Ambulatory Visit: Payer: Self-pay | Admitting: Cardiology

## 2010-07-05 LAB — CONVERTED CEMR LAB
GFR calc non Af Amer: 43.6 mL/min — ABNORMAL LOW (ref >60.00)
Glucose, Bld: 102 mg/dL — ABNORMAL HIGH (ref 70–99)
Potassium: 4.7 meq/L (ref 3.5–5.1)
Sodium: 140 meq/L (ref 135–145)

## 2010-07-24 ENCOUNTER — Ambulatory Visit: Payer: Self-pay | Admitting: Cardiology

## 2010-07-24 ENCOUNTER — Encounter: Payer: Self-pay | Admitting: Cardiology

## 2010-07-24 ENCOUNTER — Telehealth: Payer: Self-pay | Admitting: Cardiology

## 2010-07-25 LAB — CONVERTED CEMR LAB
Basophils Relative: 0.3 % (ref 0.0–3.0)
CO2: 18 meq/L — ABNORMAL LOW (ref 19–32)
Chloride: 111 meq/L (ref 96–112)
Creatinine, Ser: 1.2 mg/dL (ref 0.4–1.2)
Eosinophils Absolute: 0.1 10*3/uL (ref 0.0–0.7)
Hemoglobin: 12 g/dL (ref 12.0–15.0)
MCHC: 33.3 g/dL (ref 30.0–36.0)
MCV: 97.2 fL (ref 78.0–100.0)
Monocytes Absolute: 0.2 10*3/uL (ref 0.1–1.0)
Neutro Abs: 3.1 10*3/uL (ref 1.4–7.7)
Neutrophils Relative %: 51.7 % (ref 43.0–77.0)
RBC: 3.71 M/uL — ABNORMAL LOW (ref 3.87–5.11)

## 2010-07-26 ENCOUNTER — Ambulatory Visit (HOSPITAL_COMMUNITY)
Admission: RE | Admit: 2010-07-26 | Discharge: 2010-07-26 | Payer: Self-pay | Source: Home / Self Care | Attending: Cardiovascular Disease | Admitting: Cardiovascular Disease

## 2010-08-16 ENCOUNTER — Telehealth: Payer: Self-pay | Admitting: Physician Assistant

## 2010-08-18 ENCOUNTER — Encounter: Payer: Self-pay | Admitting: Neurosurgery

## 2010-08-18 ENCOUNTER — Encounter: Payer: Self-pay | Admitting: Obstetrics and Gynecology

## 2010-08-18 ENCOUNTER — Encounter: Payer: Self-pay | Admitting: Gastroenterology

## 2010-08-19 ENCOUNTER — Ambulatory Visit
Admission: RE | Admit: 2010-08-19 | Discharge: 2010-08-19 | Payer: Self-pay | Source: Home / Self Care | Attending: Cardiology | Admitting: Cardiology

## 2010-08-19 ENCOUNTER — Other Ambulatory Visit: Payer: Self-pay | Admitting: Cardiology

## 2010-08-19 ENCOUNTER — Encounter: Payer: Self-pay | Admitting: Cardiology

## 2010-08-19 DIAGNOSIS — I2510 Atherosclerotic heart disease of native coronary artery without angina pectoris: Secondary | ICD-10-CM

## 2010-08-19 DIAGNOSIS — E7800 Pure hypercholesterolemia, unspecified: Secondary | ICD-10-CM

## 2010-08-19 DIAGNOSIS — I10 Essential (primary) hypertension: Secondary | ICD-10-CM

## 2010-08-20 ENCOUNTER — Encounter: Payer: Self-pay | Admitting: Internal Medicine

## 2010-08-20 LAB — CBC WITH DIFFERENTIAL/PLATELET
Eosinophils Relative: 1.9 % (ref 0.0–5.0)
Lymphocytes Relative: 35.6 % (ref 12.0–46.0)
Monocytes Relative: 4.9 % (ref 3.0–12.0)
Neutrophils Relative %: 56.7 % (ref 43.0–77.0)
Platelets: 233 10*3/uL (ref 150.0–400.0)
WBC: 6.3 10*3/uL (ref 4.5–10.5)

## 2010-08-21 ENCOUNTER — Telehealth: Payer: Self-pay | Admitting: Cardiology

## 2010-08-22 ENCOUNTER — Ambulatory Visit
Admission: RE | Admit: 2010-08-22 | Discharge: 2010-08-22 | Payer: Self-pay | Source: Home / Self Care | Attending: Cardiology | Admitting: Cardiology

## 2010-08-22 ENCOUNTER — Other Ambulatory Visit: Payer: Self-pay

## 2010-08-22 LAB — HEPATIC FUNCTION PANEL
AST: 15 U/L (ref 0–37)
Albumin: 3.8 g/dL (ref 3.5–5.2)

## 2010-08-22 LAB — BASIC METABOLIC PANEL
BUN: 32 mg/dL — ABNORMAL HIGH (ref 6–23)
Chloride: 110 mEq/L (ref 96–112)
Creatinine, Ser: 1.5 mg/dL — ABNORMAL HIGH (ref 0.4–1.2)
GFR: 36.99 mL/min — ABNORMAL LOW (ref >60.00)
Glucose, Bld: 96 mg/dL (ref 70–99)
Potassium: 4.6 mEq/L (ref 3.5–5.1)

## 2010-08-22 LAB — CBC WITH DIFFERENTIAL/PLATELET
Basophils Absolute: 0 10*3/uL (ref 0.0–0.1)
HCT: 35.9 % — ABNORMAL LOW (ref 36.0–46.0)
Lymphs Abs: 2.1 10*3/uL (ref 0.7–4.0)
MCV: 96.2 fl (ref 78.0–100.0)
Monocytes Absolute: 0.3 10*3/uL (ref 0.1–1.0)
Neutrophils Relative %: 59.7 % (ref 43.0–77.0)
Platelets: 223 10*3/uL (ref 150.0–400.0)
RDW: 16.1 % — ABNORMAL HIGH (ref 11.5–14.6)

## 2010-08-22 LAB — LIPID PANEL
Cholesterol: 125 mg/dL (ref 0–200)
Triglycerides: 107 mg/dL (ref 0.0–149.0)
VLDL: 21.4 mg/dL (ref 0.0–40.0)

## 2010-08-23 ENCOUNTER — Encounter: Payer: Self-pay | Admitting: Cardiology

## 2010-08-25 LAB — CONVERTED CEMR LAB
BUN: 16 mg/dL (ref 6–23)
BUN: 19 mg/dL (ref 6–23)
Basophils Relative: 0.4 % (ref 0.0–3.0)
Basophils Relative: 0.5 % (ref 0.0–3.0)
CO2: 20 meq/L (ref 19–32)
CO2: 23 meq/L (ref 19–32)
Calcium: 9.4 mg/dL (ref 8.4–10.5)
Calcium: 9.7 mg/dL (ref 8.4–10.5)
Chloride: 112 meq/L (ref 96–112)
Chloride: 114 meq/L — ABNORMAL HIGH (ref 96–112)
Creatinine, Ser: 0.9 mg/dL (ref 0.4–1.2)
Creatinine, Ser: 1.2 mg/dL (ref 0.4–1.2)
Eosinophils Absolute: 0.1 10*3/uL (ref 0.0–0.7)
Eosinophils Relative: 1 % (ref 0.0–5.0)
Eosinophils Relative: 2.1 % (ref 0.0–5.0)
GFR calc non Af Amer: 46.89 mL/min (ref >60)
GFR calc non Af Amer: 53.48 mL/min (ref >60)
HCT: 37.1 % (ref 36.0–46.0)
INR: 0.9 (ref 0.8–1.0)
Lymphocytes Relative: 40.8 % (ref 12.0–46.0)
Lymphs Abs: 2.6 10*3/uL (ref 0.7–4.0)
MCHC: 34.1 g/dL (ref 30.0–36.0)
MCV: 96.3 fL (ref 78.0–100.0)
Monocytes Absolute: 0.3 10*3/uL (ref 0.1–1.0)
Monocytes Absolute: 0.4 10*3/uL (ref 0.1–1.0)
Monocytes Relative: 5.4 % (ref 3.0–12.0)
Neutrophils Relative %: 50.4 % (ref 43.0–77.0)
Neutrophils Relative %: 52.3 % (ref 43.0–77.0)
POC INR: 3
Platelets: 228 10*3/uL (ref 150.0–400.0)
Platelets: 247 10*3/uL (ref 150.0–400.0)
Potassium: 4.3 meq/L (ref 3.5–5.1)
RBC: 3.65 M/uL — ABNORMAL LOW (ref 3.87–5.11)
Sodium: 139 meq/L (ref 135–145)
WBC: 5.8 10*3/uL (ref 4.5–10.5)

## 2010-08-29 ENCOUNTER — Telehealth: Payer: Self-pay | Admitting: Gastroenterology

## 2010-08-29 NOTE — Assessment & Plan Note (Signed)
Summary: per check out   Visit Type:  Follow-up Referring Provider:  Dr [name] Cardiology Primary Provider:  Benay Pillow, MD- PMD, Dr Addison Lank - Cardiologist. Dr Erling Cruz - Neurology  CC:  Sob two weeks ago.  History of Present Illness: Patient reviewed with Dr. Erling Cruz regarding headaches, and the issue of Topomax was discussed.  She has not used the Topomax in four days.  She had one headache.  She had shortness of breath about two weeks ago, but the humidity was very high.  Since then the shortness of breath is better.  She has not been walking much lately.  Current Medications (verified): 1)  Amitriptyline Hcl 50 Mg  Tabs (Amitriptyline Hcl) .... One Tablet Every Night At Bedtime 2)  Plavix 75 Mg Tabs (Clopidogrel Bisulfate) .Marland Kitchen.. 1 Tab Once Daily 3)  Crestor 20 Mg Tabs (Rosuvastatin Calcium) .... Take One Tablet By Mouth Daily. 4)  Protonix 40 Mg Solr (Pantoprazole Sodium) .... As Needed 5)  Topamax 25 Mg Tabs (Topiramate) .... Take 2 Tab By Mouth At Bedtime 6)  Losartan Potassium 25 Mg Tabs (Losartan Potassium) .... Take 1 Tablet By Mouth Once A Day  Allergies: 1)  ! Sulfa  Past History:  Past Medical History: Last updated: 09/05/2009 Current Problems: CAD-S/P MI 8/10 DIVERTICULOSIS HYPERLIPIDEMIA  COLONIC POLYPS, ADENOMATOUS (ICD-211.3) MASTOIDITIS (ICD-383.9) CONSTIPATION (ICD-564.00) GERD (ICD-530.81)/SRICTURE DILATED 10/10 IBS (ICD-564.1) Myocardial Infarction Hypertension  Past Surgical History: Last updated: 04/24/2009 T A H and B S O 17 left ear operations S/P CERVICAL NECK SURGERY 8/08 S/P LUMBAR SURGERY 8/09  CAD-S/P BARE METAL STENT LAD 8/10  Family History: Last updated: 08/14/2009 Family History of Colon Cancer:Mother and brother Bladder Cancer: Brother  Social History: Last updated: 09/05/2009 Occupation: Unemployed Patient currently smokes. 1ppd x 30    quit- in October 2010 Alcohol Use - no Daily Caffeine Use -2 Illicit Drug Use -  no Divorced  Vital Signs:  Patient profile:   65 year old female Height:      63 inches Weight:      161 pounds Pulse rate:   90 / minute Pulse rhythm:   regular Resp:     18 per minute BP sitting:   160 / 100  (right arm) Cuff size:   large  Vitals Entered By: Sidney Ace (December 27, 2009 2:01 PM)  Physical Exam  General:  Well developed, well nourished, in no acute distress. Head:  normocephalic and atraumatic Eyes:  PERRLA/EOM intact; conjunctiva and lids normal. Neck:  No carotid bruits.  Lungs:  Clear bilaterally to auscultation and percussion. Heart:  PMI non displaced. Normal S1 and S2.  No definite murmur. Abdomen:  Bowel sounds positive; abdomen soft and non-tender without masses, organomegaly, or hernias noted. No hepatosplenomegaly. Extremities:  No clubbing or cyanosis. Neurologic:  Alert and oriented x 3.   EKG  Procedure date:  12/27/2009  Findings:      NSR.  LAE.  I RBBB.  Leftward axis,  Impression & Recommendations:  Problem # 1:  DYSPNEA (ICD-786.05) Substantially improved.  Thought initially secondary to Topomax, and improved.  She developed clear cut metabolic acidosis, and improved after dc of drug.  Has stopped voluntarily at this point.  I encouraged her to walk, and be more active.  She is agreeeale.  Problem # 2:  HYPERTENSION (ICD-401.9) BP remains elevated.  May need higher dose of losartan.  The following medications were removed from the medication list:    Cozaar 25 Mg Tabs (Losartan potassium) .Marland KitchenMarland KitchenMarland KitchenMarland Kitchen  Take 1 tablet by mouth once a day Her updated medication list for this problem includes:    Losartan Potassium 25 Mg Tabs (Losartan potassium) .Marland Kitchen... Take 1 tablet by mouth once a day  Problem # 3:  CORONARY ARTERY DISEASE (ICD-414.00) no reccurrent symptoms.  Remains stable.  Her updated medication list for this problem includes:    Plavix 75 Mg Tabs (Clopidogrel bisulfate) .Marland Kitchen... 1 tab once daily  Orders: EKG w/ Interpretation  (93000)  Problem # 4:  HYPERLIPIDEMIA (ICD-272.4) currently at target on meds.  Her updated medication list for this problem includes:    Crestor 20 Mg Tabs (Rosuvastatin calcium) .Marland Kitchen... Take one tablet by mouth daily.  Patient Instructions: 1)  Your physician recommends that you schedule a follow-up appointment in: 3 months with Dr. [name] 2)  Your physician recommends that you continue on your current medications as directed. Please refer to the Current Medication list given to you today.

## 2010-08-29 NOTE — Medication Information (Signed)
Summary: [facility]  Anticoagulant Therapy  Managed by: Porfirio Oar, PharmD Referring MD: Bing Quarry, MD PCP: Benay Pillow, MD- PMD, Dr Addison Lank - Cardiologist. Dr Erling Cruz - Neurology Supervising MD: Olevia Perches MD, Ahna Konkle Indication 1: Pulmonary Embolism Lab Used: [facility] Site: [place] [facility] 43.2 INR POC 3.6 INR RANGE 2.0-3.0  Dietary changes: yes       Details: eating very little   Health status changes: no    Bleeding/hemorrhagic complications: yes       Details: having black stools  Recent/future hospitalizations: no    Any changes in medication regimen? no    Recent/future dental: no  Any missed doses?: no       Is patient compliant with meds? yes       Allergies: 1)  ! Sulfa  Anticoagulation Management History:      Her anticoagulation is being managed by telephone today.  Negative risk factors for bleeding include an age less than 70 years old.  The bleeding index is 'low risk'.  Positive CHADS2 values include History of HTN.  Negative CHADS2 values include Age > 75 years old.  Her last INR was 0.9 ratio.  Prothrombin time is 43.2.  Anticoagulation responsible provider: Olevia Perches MD, Darnell Level.  INR POC: 3.6.    Anticoagulation Management Assessment/Plan:      The target INR is 2.0-3.0.  The next INR is due 05/10/2010.  Anticoagulation instructions were given to patient.  Results were reviewed/authorized by Porfirio Oar, PharmD.  She was notified by Porfirio Oar PharmD.        Coagulation management information includes: Call Pt's son with dosing changes- [name] [phone].  Prior Anticoagulation Instructions: INR 3.8  Spoke with pt.  Skip today's dose of Coumadin then decrease dose to 1 tablet every day except 1/2 tablet on Monday.  Recheck INR in 1 week.  Orders given to [place] with [facility].   Current Anticoagulation Instructions: INR 3.6  Spoke to [place], Therapist, sports while in pt's home.  Skip today's dose of Coumadin then decrease dose to 1 tablet  every day except 1/2 tablet on Monday, Wednesday and Friday.  Recheck INR in 1 week.

## 2010-08-29 NOTE — Miscellaneous (Signed)
Summary: [facility] Orders   [facility] Orders   Imported By: Sallee Provencal 06/03/2010 08:51:37  _____________________________________________________________________  External Attachment:    Type:   Image     Comment:   External Document

## 2010-08-29 NOTE — Progress Notes (Signed)
Summary: Repeat labs  Phone Note Call from Patient Call back at [phone]   Caller: Patient Summary of Call: Pt returning call Initial call taken by: Delsa Sale,  August 21, 2010 4:01 PM  Follow-up for Phone Call        I spoke with the pt and the lab that was collected on 08/19/10 was not spun and could not be resulted. The pt will come into the office on 08/22/10 for repeat bloodwork at no charge.  Follow-up by: [name], RN, BSN,  August 21, 2010 4:06 PM

## 2010-08-29 NOTE — Medication Information (Signed)
Summary: Coumadin Clinic  Anticoagulant Therapy  Managed by: Porfirio Oar, PharmD Referring MD: Bing Quarry, MD PCP: Benay Pillow, MD- PMD, Dr Addison Lank - Cardiologist. Dr Erling Cruz - Neurology Supervising MD: Olevia Perches MD, Bruce Indication 1: Pulmonary Embolism Lab Used: [facility] Site: [place] [facility] 46.1 INR POC 3.8 INR RANGE 2.0-3.0  Dietary changes: no    Health status changes: no    Bleeding/hemorrhagic complications: no    Recent/future hospitalizations: no    Any changes in medication regimen? yes       Details: had some medication changes but not sure what they are   Recent/future dental: no  Any missed doses?: no       Is patient compliant with meds? yes       Allergies: 1)  ! Sulfa  Anticoagulation Management History:      Her anticoagulation is being managed by telephone today.  Negative risk factors for bleeding include an age less than 52 years old.  The bleeding index is 'low risk'.  Positive CHADS2 values include History of HTN.  Negative CHADS2 values include Age > 72 years old.  Her last INR was 0.9 ratio.  Prothrombin time is 46.1.  Anticoagulation responsible provider: Olevia Perches MD, Darnell Level.  INR POC: 3.8.    Anticoagulation Management Assessment/Plan:      The next INR is due 05/03/2010.  Results were reviewed/authorized by Porfirio Oar, PharmD.  She was notified by Porfirio Oar PharmD.         Prior Anticoagulation Instructions: INR 2.4  Spoke with [facility] RN while at pt's home.  Continue on same dosage 2.5mg  daily.  Recheck on Wednesday 04/24/10.  Current Anticoagulation Instructions: INR 3.8  Spoke with pt.  Skip today's dose of Coumadin then decrease dose to 1 tablet every day except 1/2 tablet on Monday.  Recheck INR in 1 week.  Orders given to [place] with [facility].

## 2010-08-29 NOTE — Progress Notes (Signed)
Summary: TRIAGE  Phone Note Call from Patient Call back at Home Phone [phone]   Caller: Patient Call For: [name] Reason for Call: Talk to Nurse Summary of Call: Pt is having alot of abdominal pain and discomfort Initial call taken by: Webb Laws,  August 13, 2009 9:29 AM  Follow-up for Phone Call        Pt. c/o mid abd.pain & bloating, constant, worse at night, began last week.  Denies n/v, fever, constipation, diarrhea, blood, black stools. Takes Protonix two times a day.   1) See Amy Esterwood PAC on 08-14-09 at 11am 2) Soft,bland diet. No spicy,greasy,fried foods.  3) tylenol/Ibuprofen as needed 4) If symptoms become worse call back immediately or go to ER.  Follow-up by: Vivia Ewing LPN,  January 17, [id number] 9:48 AM

## 2010-08-29 NOTE — Assessment & Plan Note (Signed)
Summary: sob,asthma, co2 - 14/apc   Visit Type:  Initial Consult Copy to:  Dr [name] Cardiology Primary Provider/Referring Provider:  Benay Pillow, MD- PMD, Dr Addison Lank - Cardiologist  CC:  Pulmonary Consult for increased SOB since Sept 2010 and after pt had MI. Marland Kitchen  History of Present Illness: iov 09/05/2009: REferred by Dr [name]. 65 year old ex-30pack smoker. Main complaint is dyspnea. Dyspnea onset was after MI in August 2010 which was treated with angioplasty/stent per her hx. Had first stent in August foloowed by another early Dec 2010.  Denies dyspnea prior to MI in August 2010. States that 1 month later dyspnea started. Dyspnea started after restarting tpimarax for migraines. Per patient Topiramax started in September (but per chart review of Dr. Maren Beach note, this was started in November).  Per chart review, dyspnea started in December which again corresponds to starting topirmaz in November.  Insidious onset. Initially progressive but now getting better past one week. She believes that cutting down topiramax from 3 tablets per day to 1 tablet a da on 08/24/2009 has helped (date confirmed on chart review). There was concern that topiramax (restarted in September for post MI headache)  was contributing to metabolic acidosis. Bicarb oin 07/13/2009 was 17 and on 1/14/02111 was 14 but recheck on Aug 30, 2009 was 23 (5 days after stopping topiramate). ABG on 08/30/2009 ws normal as well. OF note, BNP, d-dimer test  were normal in mid-Jan 2011. Hgb wa 12gm% but unchanged in 1 year  Despite significant (80%) improvement in dyspnea after stopping topiramate she still has some dyspnea. Dyspnea brough on by exertion such as vacuuming floor, or if she is in a hurry. Improved by rest. Denies associated cough but admits to chronic, constant chest tightness since July 2010 which she thinks is due to GERD. No asspociated hemoptysis, fever, chills, weight loss, wheeze, edema, orthopnea, paroxysmal nocturnal  dyspnea.   Of note, was diagnosed with "asthma" some 6 years ago. Does not know details. Recollects PFTs. Does not know MDs name. Associated allergy present. Was given as needed inhaler NOS which she does not use.   Current Medications (verified): 1)  Amitriptyline Hcl 50 Mg  Tabs (Amitriptyline Hcl) .... One Tablet Every Night At Bedtime 2)  Hydrocodone-Acetaminophen 5-500 Mg Tabs (Hydrocodone-Acetaminophen) .... As Needed 3)  Dulcolax 5 Mg Tbec (Bisacodyl) .... As Needed 4)  Plavix 75 Mg Tabs (Clopidogrel Bisulfate) .Marland Kitchen.. 1 Tab Once Daily 5)  Lisinopril 2.5 Mg Tabs (Lisinopril) .... Take One Tablet By Mouth Daily 6)  Crestor 40 Mg Tabs (Rosuvastatin Calcium) .... Take One- Half  Tablet By Mouth Daily. 7)  Carvedilol 12.5 Mg Tabs (Carvedilol) .... Take One Tablet By Mouth Twice A Day 8)  Protonix 40 Mg Solr (Pantoprazole Sodium) .... Take 1 Tqab Daily 12 Hours Away From Plavix 9)  Topamax 25 Mg Tabs (Topiramate) .... Take 1 Tab By Mouth At Bedtime  Allergies (verified): 1)  ! Sulfa  Past History:  Past Medical History: Current Problems: CAD-S/P MI 8/10 DIVERTICULOSIS HYPERLIPIDEMIA  COLONIC POLYPS, ADENOMATOUS (ICD-211.3) MASTOIDITIS (ICD-383.9) CONSTIPATION (ICD-564.00) GERD (ICD-530.81)/SRICTURE DILATED 10/10 IBS (ICD-564.1) Myocardial Infarction Hypertension  Social History: Occupation: Unemployed Patient currently smokes. 1ppd x 30    quit- in October 2010 Alcohol Use - no Daily Caffeine Use -2 Illicit Drug Use - no Divorced  Review of Systems       The patient complains of shortness of breath with activity, shortness of breath at rest, indigestion, abdominal pain, difficulty swallowing, headaches, nasal congestion/difficulty  breathing through nose, and ear ache.  The patient denies productive cough, non-productive cough, coughing up blood, chest pain, irregular heartbeats, acid heartburn, loss of appetite, weight change, sore throat, tooth/dental problems, sneezing,  itching, anxiety, depression, hand/feet swelling, joint stiffness or pain, rash, change in color of mucus, and fever.    Vital Signs:  Patient profile:   65 year old female Height:      63 inches Weight:      164 pounds O2 Sat:      95 % on Room air Temp:     97.7 degrees F oral Pulse rate:   93 / minute BP sitting:   122 / 72  (right arm) Cuff size:   regular  Vitals Entered By: Hawkins Bing CMA (September 05, 2009 1:36 PM)  O2 Flow:  Room air  Serial Vital Signs/Assessments:  Comments: Ambulatory Pulse Oximetry  Resting; HR___94__    02 Sat_95____  Lap1 (185 feet)   HR___104__   02 Sat_92____ Lap2 (185 feet)   HR_104____   02 Sat_92____    Lap3 (185 feet)   HR___110__   02 Sat__94___  __x_Test Completed without Difficulty ___Test Stopped due to:   By: Bath Bing CMA   CC: Pulmonary Consult for increased SOB since Sept 2010, after pt had MI.  Comments Medications reviewed with patient [place] Bing CMA  September 05, 2009 1:37 PM Daytime phone number verified with patient.    Physical Exam  General:  well developed, well nourished, in no acute distress Head:  normocephalic and atraumatic Eyes:  PERRLA/EOM intact; conjunctiva and sclera clear Ears:  TMs intact and clear with normal canals. Hard of hearing in left ear Nose:  no deformity, discharge, inflammation, or lesions Mouth:  no deformity or lesions Neck:  no masses, thyromegaly, or abnormal cervical nodes Chest Wall:  no deformities noted Lungs:  clear bilaterally to auscultation and percussion Heart:  regular rate and rhythm, S1, S2 without murmurs, rubs, gallops, or clicks Abdomen:  bowel sounds positive; abdomen soft and non-tender without masses, or organomegaly Msk:  no deformity or scoliosis noted with normal posture Pulses:  pulses normal Extremities:  no clubbing, cyanosis, edema, or deformity noted Neurologic:  CN II-XII grossly intact with normal reflexes, coordination, muscle  strength and tone Skin:  intact without lesions or rashes Cervical Nodes:  no significant adenopathy Axillary Nodes:  no significant adenopathy Psych:  alert and cooperative; normal mood and affect; normal attention span and concentration   MISC. Report  Procedure date:  08/30/2009  Findings:      . Bicarb oin 07/13/2009 was 17 and on 1/14/02111 was 14 but recheck on Aug 30, 2009 was 23 (5 days after stopping topiramate)  MISC. Report  Procedure date:  08/13/2009  Findings:       D-Dimer, Fibrin Derivatives              0.45              0.00-0.48   Beta Natriuretic Peptide                 <30.0             0.0-100.0        pg/mL Hemoglobin (HGB)                         12.0              12.0-15.0  g/dL  Comments:      hgb is unchanged since august 2010 but before that in 2008 was 13-14gm%  CXR  Procedure date:  08/13/2009  Findings:      Comparison: 05/08/2009    Findings: The heart size and mediastinal contours are within normal   limits.  Both lungs are clear.  Degenerative changes of the   thoracic spine noted.    IMPRESSION:   No active disease.    Read By:  Earl Gala.,  M.D.   Released By:  Earl Gala.,  M.D.   Comments:      personally reviewed  Impression & Recommendations:  Problem # 1:  DYSPNEA (ICD-786.05) Assessment New Agree with Dr. [name] that most of dyspnea in this lady was brough on by topiramate induced metabolic acidosis. Worst bicarb was 71 in mid-dec 2010. Now the bicarb has normalize after cutting down topirmaate and ABG is normal (on Aug 30, 2009). Correspondingly dyspnea has improved. However, agree with Dr. Dennis Bast concern that residual dyspnea is a problem. She did not desaturate on exertion in the office. Am not sure if residual dyspnea is due to underlying undiagnosed COPD or asthma or due to the one tablet of topirmaate she is on (even though bicarb is normal). Some baseline hgb of 12gm% could be contributing as  well. Clearly on labs from [place] is no evidence of CHF or PE contirubting to dyspnea  I will get full PFTs and if normal she might need CPST wtih EIB challenge for asthma or not.  Orders: Pulmonary Referral (Pulmonary) Consultation Level V [id number])  Patient Instructions: 1)  have full pft (breathing) test 2)  based on result I will ask you to come in to see me or go ahead and get bike lung stress test at [facility] 3)  please call us once breathing test (pft) is completed   Immunization History:  Influenza Immunization History:    Influenza:  fluvax 3+ (04/27/2009)  Pneumovax Immunization History:    Pneumovax:  pneumovax (04/27/2009)   Appended Document: sob,asthma, co2 - 14/apc report reviewed.  Appreciate consultation.  TS

## 2010-08-29 NOTE — Assessment & Plan Note (Signed)
Summary: eph   Visit Type:  Follow-up Referring Aveon Colquhoun:  Dr [name] Cardiology Primary Yi Haugan:  Benay Pillow, MD- PMD, Dr Addison Lank - Cardiologist. Dr Erling Cruz - Neurology  CC:  "one sharp pain".  History of Present Illness: Overall [name] is doing reasonably well since discharge again from the hospital.  She has had "one" sharp pain that was very brief.  She has not had progressive symptoms, nor recurrent symptoms, and is tolerating her medication.  She had gone back to three Topomax, but has cut back down at the present time and is back on two.  Her headaches at present are controlled, and she looks perhaps better than she has for awhile.    Problems Prior to Update: 1)  Coronary Atherosclerosis Native Coronary Artery  (ICD-414.01) 2)  Unspecified Anemia  (ICD-285.9) 3)  Dyspnea  (ICD-786.05) 4)  Hypertension  (ICD-401.9) 5)  Hx of Myocardial Infarction  (ICD-410.90) 6)  Shortness of Breath  (ICD-786.05) 7)  Diverticulosis-colon  (ICD-562.10) 8)  Abdominal Pain, Generalized  (ICD-789.07) 9)  Abdominal Pain, Left Lower Quadrant  (ICD-789.04) 10)  Tobacco Abuse  (ICD-305.1) 11)  Personal Hx Colonic Polyps  (ICD-V12.72) 12)  Coronary Artery Disease  (ICD-414.00) 13)  Hyperlipidemia  (ICD-272.4) 14)  Dysphagia Unspecified  (ICD-787.20) 15)  Gerd  (ICD-530.81) 16)  Nausea  (ICD-787.02) 17)  Mi  (ICD-410.90) 18)  Hypertension, Benign  (ICD-401.1) 19)  Tobacco Abuse  (ICD-305.1) 20)  Left Ventricular Function, Decreased  (ICD-429.2) 21)  Cad, Native Vessel  (ICD-414.01) 22)  Chest Pain  (ICD-786.50) 23)  Fm Hx Malignant Neoplasm Gastrointestinal Tract  (ICD-V16.0) 24)  Dyspepsia  (ICD-536.8) 25)  Colonic Polyps, Adenomatous  (ICD-211.3) 26)  Mastoiditis  (ICD-383.9) 27)  Constipation  (ICD-564.00) 28)  Gerd  (ICD-530.81) 29)  Ibs  (ICD-564.1)  Current Medications (verified): 1)  Amitriptyline Hcl 50 Mg  Tabs (Amitriptyline Hcl) .... One Tablet Every Night At Bedtime 2)   Crestor 20 Mg Tabs (Rosuvastatin Calcium) .... Take One Tablet By Mouth Daily. 3)  Protonix 40 Mg Solr (Pantoprazole Sodium) .... As Needed 4)  Topamax 25 Mg Tabs (Topiramate) .... Take 2 Tab By Mouth At Bedtime 5)  Hydrocodone-Acetaminophen 7.5-325 Mg Tabs (Hydrocodone-Acetaminophen) .Marland Kitchen.. 1 To Tablets Pi As Needed Every 4 To 6 Hrs 6)  Proair Hfa 108 (90 Base) Mcg/act Aers (Albuterol Sulfate) .... Inhale 1-2 Puffs By Mouth Every 4-6 Hours As Needed 7)  Potassium Chloride 20 Meq Pack (Potassium Chloride) .... Take One Daily 8)  Lisinopril 10 Mg Tabs (Lisinopril) .... Take One Daily 9)  Bisoprolol Fumarate 10 Mg Tabs (Bisoprolol Fumarate) .... Take 1 1/2 Daily 10)  Aspir-Low 81 Mg Tbec (Aspirin) .... Take One Daily  Allergies (verified): 1)  ! Sulfa  Past History:  Past Medical History: Last updated: 09/05/2009 Current Problems: CAD-S/P MI 8/10 DIVERTICULOSIS HYPERLIPIDEMIA  COLONIC POLYPS, ADENOMATOUS (ICD-211.3) MASTOIDITIS (ICD-383.9) CONSTIPATION (ICD-564.00) GERD (ICD-530.81)/SRICTURE DILATED 10/10 IBS (ICD-564.1) Myocardial Infarction Hypertension  Past Surgical History: Last updated: 04/24/2009 T A H and B S O 17 left ear operations S/P CERVICAL NECK SURGERY 8/08 S/P LUMBAR SURGERY 8/09  CAD-S/P BARE METAL STENT LAD 8/10  Family History: Last updated: 08/14/2009 Family History of Colon Cancer:Mother and brother Bladder Cancer: Brother  Vital Signs:  Patient profile:   65 year old female Height:      63 inches Weight:      146 pounds Pulse rate:   72 / minute Pulse rhythm:   regular BP sitting:   124 / 90  (  right arm)  Vitals Entered By: Talbert Nan, CMA (June 24, 2010 1:09 PM)  Physical Exam  General:  Well developed, well nourished, in no acute distress. Head:  normocephalic and atraumatic Eyes:  PERRLA/EOM intact; conjunctiva and lids normal. Lungs:  Clear overall.  Slight ronchii, improved from prior exams.  Heart:  PMI non displaced.  Pos S4  gallop.  Normal S1 and S2. Abdomen:  Bowel sounds positive; abdomen soft and non-tender without masses, organomegaly, or hernias noted. No hepatosplenomegaly. Extremities:  Contralateral extremity without significant edema.  Booted foot.  Neurologic:  Alert and oriented x 3.   EKG  Procedure date:  07/24/2010  Findings:      NSR.  Anteroseptal MI, age indeterminate.  Leftward axis deviation.  Impression & Recommendations:  Problem # 1:  CORONARY ATHEROSCLEROSIS NATIVE CORONARY ARTERY (ICD-414.01) Reinfarction in setting of fall, PE, with occlusion and reperfusion of LAD.  Has stabilized, warfarin stopped, and currently on ASA.  LAD was reopened.  Remains on ASA at present time.  The following medications were removed from the medication list:    Plavix 75 Mg Tabs (Clopidogrel bisulfate) .Marland Kitchen... 1 tab once daily Her updated medication list for this problem includes:    Lisinopril 10 Mg Tabs (Lisinopril) .Marland Kitchen... Take one daily    Bisoprolol Fumarate 10 Mg Tabs (Bisoprolol fumarate) .Marland Kitchen... Take 1 1/2 daily    Aspir-low 81 Mg Tbec (Aspirin) .Marland Kitchen... Take one daily  Problem # 2:  UNSPECIFIED ANEMIA (ICD-285.9) developed severe anemia on warfarin for PE.  Now off.  PE was small and related to ankle injury.  Ambulating better at present.    Problem # 3:  HYPERTENSION (ICD-401.9) borderline elevation at present.   The following medications were removed from the medication list:    Losartan Potassium 50 Mg Tabs (Losartan potassium) .Marland Kitchen... Take one tablet by mouth once a day Her updated medication list for this problem includes:    Lisinopril 10 Mg Tabs (Lisinopril) .Marland Kitchen... Take one daily    Bisoprolol Fumarate 10 Mg Tabs (Bisoprolol fumarate) .Marland Kitchen... Take 1 1/2 daily    Aspir-low 81 Mg Tbec (Aspirin) .Marland Kitchen... Take one daily  Orders: EKG w/ Interpretation (93000) TLB-BMP (Basic Metabolic Panel-BMET) (99991111) TLB-CBC Platelet - w/Differential (85025-CBCD)  Problem # 4:  HYPERLIPIDEMIA  (ICD-272.4) remains on lipid lowering therapy appropriately. Her updated medication list for this problem includes:    Crestor 20 Mg Tabs (Rosuvastatin calcium) .Marland Kitchen... Take one tablet by mouth daily.  Problem # 5:  TOBACCO ABUSE (ICD-305.1) had continued to smoke despite prior MI.  Quit at least for now.   Patient Instructions: 1)  Your physician recommends that you schedule a follow-up appointment in: 1 MONTH 2)  Your physician recommends that you have lab work today: BMP and CBC 3)  Your physician recommends that you continue on your current medications as directed. Please refer to the Current Medication list given to you today.

## 2010-08-29 NOTE — Miscellaneous (Signed)
Summary: [facility] Referral/Physician Order/Treatment Plan  [facility] Referral/Physician Order/Treatment Plan   Imported By: Sallee Provencal 08/28/2009 11:39:46  _____________________________________________________________________  External Attachment:    Type:   Image     Comment:   External Document

## 2010-08-29 NOTE — Progress Notes (Signed)
Summary: Pt son request call  Phone Note Call from Patient Call back at [phone]   Caller: Son/ Chrissie Noa Summary of Call: Pt son request call Initial call taken by: Delsa Sale,  July 24, 2010 8:09 AM  Follow-up for Phone Call        I spoke with the pt's son. He called to report the pt has an appt. today with Dr. [name]. He has some concerns he wanted to let us know about- He states the pt fell on Christmas day, but did not pass out. He states he was not there, but she did hit her head and also hurt her arm. He is also worried b/c she has been a little pale over the past few days and he is worried about her kidneys and that she may not be drinking enough. She is also complaining of some right sided cp. I explained I would forward the message to Dr. [name]. Follow-up by: Alvis Lemmings, RN, BSN,  July 24, 2010 11:01 AM  Additional Follow-up for Phone Call Additional follow up Details #1::        I spoke with son in detail.  Labs to be obtained.  Will try to get xray of arm when she comes for her cath.  She refuses to go to the orthopedic MD at this point.  Will check labs.  Would favor considering CT of chest if cath unrevealing, Discussed also with Dr. Burt Knack.  Additional Follow-up by: Wadie Lessen, MD, [facility],  July 25, 2010 12:25 PM

## 2010-08-29 NOTE — Progress Notes (Signed)
Summary: test results  Phone Note Call from Patient Call back at Home Phone [phone]   Caller: Patient Reason for Call: Talk to Nurse, Lab or Test Results Initial call taken by: Neil Crouch,  August 13, 2009 1:35 PM  Follow-up for Phone Call        Called pt with results. [name], RN, BSN  August 13, 2009 2:19 PM  Additional Follow-up for Phone Call Additional follow up Details #1::        Patient has some shortness of breath.  Serum CO2 is 14.  Etiol unclear.  Has abdominal pain.  Was to see [name] tomorrow.  SOB ? due to acidosis.  Given above, advised ER for prompt repeat of labs, and further abd evaluation.  Spoke with patient, and also triage nurse, encouraged them to call me for details.  Additonal potential cause is Topomax.  Does increased by Dr. Erling Cruz.  Will await evaluation. Additional Follow-up by: Wadie Lessen, MD, [facility],  August 13, 2009 6:23 PM

## 2010-08-29 NOTE — Assessment & Plan Note (Signed)
Summary: f38m   Visit Type:  Follow-up Referring Woodrow Drab:  Dr [name] Cardiology Primary Kinta Martis:  Benay Pillow, MD- PMD, Dr Addison Lank - Cardiologist. Dr Erling Cruz - Neurology  CC:  Chest pains.  History of Present Illness: Patient is seen today in followup.  Her son Chrissie Noa called today.  She fell, hurt her arm, and then refused to be seen regarding this.  It is sore.  She also bumped her head.  She has not been drinking as much fluid as she was previously.  Her main complaint is that of some chest pain.  She has had for a few days. She is not short of breath, although the pain is not dissimilar from what she had previously with her "events".  However, her memory and understanding of this have not been very good to date.  When she hurt her arm, she simply tripped.    Problems Prior to Update: 1)  Coronary Atherosclerosis Native Coronary Artery  (ICD-414.01) 2)  Unspecified Anemia  (ICD-285.9) 3)  Dyspnea  (ICD-786.05) 4)  Hypertension  (ICD-401.9) 5)  Hx of Myocardial Infarction  (ICD-410.90) 6)  Shortness of Breath  (ICD-786.05) 7)  Diverticulosis-colon  (ICD-562.10) 8)  Abdominal Pain, Generalized  (ICD-789.07) 9)  Abdominal Pain, Left Lower Quadrant  (ICD-789.04) 10)  Tobacco Abuse  (ICD-305.1) 11)  Personal Hx Colonic Polyps  (ICD-V12.72) 12)  Coronary Artery Disease  (ICD-414.00) 13)  Hyperlipidemia  (ICD-272.4) 14)  Dysphagia Unspecified  (ICD-787.20) 15)  Gerd  (ICD-530.81) 16)  Nausea  (ICD-787.02) 17)  Mi  (ICD-410.90) 18)  Hypertension, Benign  (ICD-401.1) 19)  Tobacco Abuse  (ICD-305.1) 20)  Left Ventricular Function, Decreased  (ICD-429.2) 21)  Cad, Native Vessel  (ICD-414.01) 22)  Chest Pain  (ICD-786.50) 23)  Fm Hx Malignant Neoplasm Gastrointestinal Tract  (ICD-V16.0) 24)  Dyspepsia  (ICD-536.8) 25)  Colonic Polyps, Adenomatous  (ICD-211.3) 26)  Mastoiditis  (ICD-383.9) 27)  Constipation  (ICD-564.00) 28)  Gerd  (ICD-530.81) 29)  Ibs  (ICD-564.1)  Current  Medications (verified): 1)  Amitriptyline Hcl 50 Mg  Tabs (Amitriptyline Hcl) .... One Tablet Every Night At Bedtime 2)  Crestor 20 Mg Tabs (Rosuvastatin Calcium) .... Take One Tablet By Mouth Daily. 3)  Protonix 40 Mg Solr (Pantoprazole Sodium) .... As Needed 4)  Topamax 25 Mg Tabs (Topiramate) .... Take 2 Tab By Mouth At Bedtime 5)  Proair Hfa 108 (90 Base) Mcg/act Aers (Albuterol Sulfate) .... Inhale 1-2 Puffs By Mouth Every 4-6 Hours As Needed 6)  Lisinopril 10 Mg Tabs (Lisinopril) .... Take One Tablet Every Other Day 7)  Bisoprolol Fumarate 10 Mg Tabs (Bisoprolol Fumarate) .... Take 1 1/2 Daily 8)  Aspir-Low 81 Mg Tbec (Aspirin) .... Take One Daily 9)  Vitamin D 2000 Unit Tabs (Cholecalciferol) .... Take 1 Tablet By Mouth Two Times A Day  Allergies: 1)  ! Sulfa  Past History:  Past Medical History: Last updated: 09/05/2009 Current Problems: CAD-S/P MI 8/10 DIVERTICULOSIS HYPERLIPIDEMIA  COLONIC POLYPS, ADENOMATOUS (ICD-211.3) MASTOIDITIS (ICD-383.9) CONSTIPATION (ICD-564.00) GERD (ICD-530.81)/SRICTURE DILATED 10/10 IBS (ICD-564.1) Myocardial Infarction Hypertension  Past Surgical History: Last updated: 04/24/2009 T A H and B S O 17 left ear operations S/P CERVICAL NECK SURGERY 8/08 S/P LUMBAR SURGERY 8/09  CAD-S/P BARE METAL STENT LAD 8/10  Family History: Last updated: 08/14/2009 Family History of Colon Cancer:Mother and brother Bladder Cancer: Brother  Social History: Last updated: 09/05/2009 Occupation: Unemployed Patient currently smokes. 1ppd x 30    quit- in October 2010 Alcohol Use - no Daily  Caffeine Use -2 Illicit Drug Use - no Divorced  Vital Signs:  Patient profile:   64 year old female Height:      63 inches Weight:      145 pounds BMI:     25.78 Pulse rate:   68 / minute Pulse rhythm:   regular Resp:     18 per minute BP sitting:   112 / 82  (left arm) Cuff size:   large  Vitals Entered By: Sidney Ace (July 24, 2010 2:23  PM)  Physical Exam  General:  Well developed, well nourished, in no acute distress.  Color ok. Head:  normocephalic.  No large hematoma Eyes:  PERRLA/EOM intact; conjunctiva and lids normal. Lungs:  Clear bilaterally to auscultation and percussion. Heart:  PMI non displaced. Normal S1 and S2.  Without murmur or rub. Abdomen:  Bowel sounds positive; abdomen soft and non-tender without masses, organomegaly, or hernias noted. No hepatosplenomegaly. Pulses:  pulses normal in all 4 extremities Extremities:  Boot on left foot.   Bruising on lateral aspect of right arm.   Neurologic:  Alert and oriented x 3.   EKG  Procedure date:  07/24/2010  Findings:      NSR.  Anterior MI, old   Impression & Recommendations:  Problem # 1:  CORONARY ATHEROSCLEROSIS NATIVE CORONARY ARTERY (ICD-414.01)  Am concerned about recent symptoms.  She had PE, came off of warfarin with bleeding, and also has had recurrent thrombosis of her LAD.  SHe looks surprisingly good, and we will check labs.  I think she should be restudied, as patency of her LAD is important.  Will also check D-dimer.  Her PE was very limited, and just after bed rest and boot with ankle injury.  Her updated medication list for this problem includes:    Lisinopril 10 Mg Tabs (Lisinopril) .Marland Kitchen... Take one tablet every other day    Bisoprolol Fumarate 10 Mg Tabs (Bisoprolol fumarate) .Marland Kitchen... Take 1 1/2 daily    Aspir-low 81 Mg Tbec (Aspirin) .Marland Kitchen... Take one daily  Orders: TLB-BMP (Basic Metabolic Panel-BMET) (99991111) TLB-CBC Platelet - w/Differential (85025-CBCD) TLB-PT (Protime) (85610-PTP) T-D-Dimer Fibrin Derivatives Quantitive [id number]) Cardiac Catheterization (Cardiac Cath)  Problem # 2:  UNSPECIFIED ANEMIA (ICD-285.9) Need to recheck.  [place] previously to 3.0 but has been better recently.    Problem # 3:  HYPERTENSION (ICD-401.9) continue same meds until we see cath data. Her updated medication list for this problem  includes:    Lisinopril 10 Mg Tabs (Lisinopril) .Marland Kitchen... Take one tablet every other day    Bisoprolol Fumarate 10 Mg Tabs (Bisoprolol fumarate) .Marland Kitchen... Take 1 1/2 daily    Aspir-low 81 Mg Tbec (Aspirin) .Marland Kitchen... Take one daily  Her updated medication list for this problem includes:    Lisinopril 10 Mg Tabs (Lisinopril) .Marland Kitchen... Take one tablet every other day    Bisoprolol Fumarate 10 Mg Tabs (Bisoprolol fumarate) .Marland Kitchen... Take 1 1/2 daily    Aspir-low 81 Mg Tbec (Aspirin) .Marland Kitchen... Take one daily  Patient Instructions: 1)  Your physician recommends that you schedule a follow-up appointment in 3-4 weeks.  2)  Your physician recommends that you continue on your current medications as directed. Please refer to the Current Medication list given to you today. 3)  Your physician has requested that you have a cardiac catheterization.  Cardiac catheterization is used to diagnose and/or treat various heart conditions. Doctors may recommend this procedure for a number of different reasons. The most common reason is to evaluate chest  pain. Chest pain can be a symptom of coronary artery disease (CAD), and cardiac catheterization can show whether plaque is narrowing or blocking your heart's arteries. This procedure is also used to evaluate the valves, as well as measure the blood flow and oxygen levels in different parts of your heart.  For further information please visit [email or web address].  Please follow instruction sheet, as given.

## 2010-08-29 NOTE — Progress Notes (Signed)
Summary: to call for results- Low BP  Phone Note From Other Clinic   [facility] [phone] Summary of Call: son [name] to call with lab results [phone]/also they were told pt was taking metaprolol, and she is not Initial call taken by: [name],  May 10, 2010 2:53 PM  Follow-up for Phone Call        Pt. is not taking the Metoprolol according to her son. Her blood was drawn today by the home health RN so we will wait for the results to come back and notify her son when they do. Will route this to Dr. Maren Beach RN for further instructions regarding her hypotension. Tammy Jannett Celestine RN  May 10, 2010 3:17 PM  Follow-up by: Tammy Jannett Celestine RN,  May 10, 2010 3:17 PM  Additional Follow-up for Phone Call Additional follow up Details #1::        I spoke with Dr [name] and he would like the pt to decrease her Lisinopril to 5mg  daily and come into the office on Monday for an appt with Dr [name]. I left a message for the pt's son with these instructions. I will call [name] on Monday morning.  Tammy Quay, RN, BSN  May 10, 2010 7:02 PM    Additional Follow-up for Phone Call Additional follow up Details #2::    I spoke with the pt's son and made him aware that the pt's lab results were faxed to our office.  I advised him to have the pt go to the ER for evaluation due to low Hemoglobin.  I advised him that the pt cannot drive. [name] said that [facility] had just contacted him about the pt's lab results and had advised him to contact our office for further recommendations. [name] aware and ER Notification faxed.  I spoke with [name] (customer service) at [facility] and this lab was called to Tammy Starks RN  at 4:03 AM on 05/11/10. Dr [name] spoke with the triage nurse in the ER about pt.  Follow-up by: Tammy Quay, RN, BSN,  May 13, 2010 10:40 AM  Additional Follow-up for Phone Call Additional follow up Details #3:: Details for Additional Follow-up  Action Taken: I left a voicemail for [name]--Care Manager at [facility] to call me back about this pt.  Tammy Quay, RN, BSN  May 13, 2010 11:33 AM  [name] from the ER called because the pt has not arrived.  I called the pt's son and EMS is bringing the pt to the hospital.  The pt should be arriving any minute.  I made [name] aware [phone]). Tammy Quay, RN, BSN  May 13, 2010 12:00 PM  [name] called back and she said that [name] was the triage nurse on call Friday night.  [name] is currently in a meeting and [name] will speak with her when she gets out of meeting. [name] will call me back. Tammy Quay, RN, BSN  May 13, 2010 12:20 PM  I spoke with [name] [phone]) and results were called to [name] but were not followed up on.  [name] is writing incident up and sending through [facility] corrective action process. [facility] will be documented through [facility]. Additional Follow-up by: Tammy Quay, RN, BSN,  May 14, 2010 9:22 AM   Appended Document: to call for results- Low BP Patient was admitted to the hospital and received prbc. TS

## 2010-08-29 NOTE — Assessment & Plan Note (Signed)
Summary: F/U DYSPNEA/RESULTS OF CPST/RJC   Visit Type:  Follow-up Copy to:  Dr [name] Cardiology Primary Provider/Referring Provider:  Benay Pillow, MD- PMD, Dr Addison Lank - Cardiologist. Dr Erling Cruz - Neurology  CC:  F?U Dyspnea.  History of Present Illness:  65 year old ex-30pack smoker. MI in August 2010 which was treated with angioplasty/stent. Subsequewntly developed dyspnea that did not resolve despite another stent in early Dec 2010. Diagnosed to have topiramate induced acidosis iwth low bicarb. Had 80% improvement of dspnea  with improvement in bicarb when she cut topiramate from 3 tabs to 1 tab in early 2011.   OV 12/04/2009: she is seeing me for residual dyspnea that persisted after cutting her topirmate down in early 2011. I am seeing her since early Feb 2011. Last visit in late FEb 2011, I changed ACE inhibitor lisinopril to ARB Cozaar. Subshequently she has seen Dr. [name] on 11/13/2009 and he has discontinued her carvedilol (his note reviewd). Despite this dyspnea persists. Of note, she increased her topiramate to 2 tablets per day in FEb 2011 and is unclear if this has worsened dyspnea. The past 2 weeks she has cut down for unclear reasons her topirmate to 1 tab qhs and this has not helped improve dyspnea.  Her  bicarb on 09/24/2009 while taking 2 tab topiramate was  normal at 24; this suggests that 2 tabs topirmate is not the etiology for dyspnea.  Currently it appears that she is restricting herself in order to prevent dyspnea. She still gets dyspneic climbing a flight of stairs. She is under-playing severity of dyspnea.  Of note,  PFTs on 21/10/2009 and this is essentially normal except for air trapping (RV 137%) and low dlco (66%) c/w mild COPD.  REC: CPST and NP visit for med calendarOV 01/14/2010: Followup dyspnea. Since last visit stopped topiramate and then restarted. She states dyspnea did not improved when topiramate was stoped. REstarting tipirmate has not made dyspnea worse.  CPST was done 01/02/2010. I independently reviewed it. It was done while off topiramate. Suggests pain, hyperventilation, or anxiety as etiology for dyspnea.  Current Medications (verified): 1)  Amitriptyline Hcl 50 Mg  Tabs (Amitriptyline Hcl) .... One Tablet Every Night At Bedtime 2)  Plavix 75 Mg Tabs (Clopidogrel Bisulfate) .Marland Kitchen.. 1 Tab Once Daily 3)  Crestor 20 Mg Tabs (Rosuvastatin Calcium) .... Take One Tablet By Mouth Daily. 4)  Protonix 40 Mg Solr (Pantoprazole Sodium) .... As Needed 5)  Topamax 25 Mg Tabs (Topiramate) .... Take 2 Tab By Mouth At Bedtime 6)  Losartan Potassium 50 Mg Tabs (Losartan Potassium) .... Take One Tablet By Mouth Once A Day 7)  Hydrocodone-Acetaminophen 7.5-325 Mg Tabs (Hydrocodone-Acetaminophen) .Marland Kitchen.. 1 To Tablets Pi As Needed Every 4 To 6 Hrs  Allergies: 1)  ! Sulfa  Past History:  Family History: Last updated: 08/14/2009 Family History of Colon Cancer:Mother and brother Bladder Cancer: Brother  Social History: Last updated: 09/05/2009 Occupation: Unemployed Patient currently smokes. 1ppd x 30    quit- in October 2010 Alcohol Use - no Daily Caffeine Use -2 Illicit Drug Use - no Divorced  Risk Factors: Smoking Status: current (12/04/2008)  Past Medical History: Reviewed history from 09/05/2009 and no changes required. Current Problems: CAD-S/P MI 8/10 DIVERTICULOSIS HYPERLIPIDEMIA  COLONIC POLYPS, ADENOMATOUS (ICD-211.3) MASTOIDITIS (ICD-383.9) CONSTIPATION (ICD-564.00) GERD (ICD-530.81)/SRICTURE DILATED 10/10 IBS (ICD-564.1) Myocardial Infarction Hypertension  Past Surgical History: Reviewed history from 04/24/2009 and no changes required. T A H and B S O 17 left ear operations S/P CERVICAL NECK  SURGERY 8/08 S/P LUMBAR SURGERY 8/09  CAD-S/P BARE METAL STENT LAD 8/10  Family History: Reviewed history from 08/14/2009 and no changes required. Family History of Colon Cancer:Mother and brother Bladder Cancer: Brother  Social  History: Reviewed history from 09/05/2009 and no changes required. Occupation: Unemployed Patient currently smokes. 1ppd x 30    quit- in October 2010 Alcohol Use - no Daily Caffeine Use -2 Illicit Drug Use - no Divorced  Review of Systems       The patient complains of shortness of breath with activity, shortness of breath at rest, chest pain, weight change, abdominal pain, sore throat, tooth/dental problems, headaches, ear ache, anxiety, depression, and joint stiffness or pain.  The patient denies non-productive cough, coughing up blood, irregular heartbeats, loss of appetite, difficulty swallowing, nasal congestion/difficulty breathing through nose, sneezing, itching, rash, change in color of mucus, and fever.    Vital Signs:  Patient profile:   65 year old female O2 Sat:      97 % on Room air Temp:     99.0 degrees F oral Pulse rate:   115 / minute BP sitting:   144 / 88  (left arm) Cuff size:   regular  Vitals Entered By: Lyndee Leo, CMA (January 14, 2010 10:38 AM)  O2 Sat at Rest %:  97% O2 Flow:  Room air  Reason for Visit f/u Dyspnea results of CPST  Physical Exam  General:  well developed, well nourished, in no acute distress Head:  normocephalic and atraumatic Eyes:  PERRLA/EOM intact; conjunctiva and sclera clear Ears:  TMs intact and clear with normal canals. Hard of hearing in left ear Nose:  no deformity, discharge, inflammation, or lesions Mouth:  no deformity or lesions Neck:  no masses, thyromegaly, or abnormal cervical nodes Chest Wall:  no deformities noted Lungs:  clear bilaterally to auscultation and percussion Heart:  regular rate and rhythm, S1, S2 without murmurs, rubs, gallops, or clicks Abdomen:  bowel sounds positive; abdomen soft and non-tender without masses, or organomegaly Msk:  no deformity or scoliosis noted with normal posture Pulses:  pulses normal Extremities:  no clubbing, cyanosis, edema, or deformity noted Neurologic:  CN  II-XII grossly intact with normal reflexes, coordination, muscle strength and tone Skin:  intact without lesions or rashes Cervical Nodes:  no significant adenopathy Axillary Nodes:  no significant adenopathy Psych:  alert and cooperative; normal mood and affect; normal attention span and concentration   MISC. Report  Procedure date:  01/02/2010  Findings:      CPST was done 01/02/2010. I independently reviewed it. It was done while off topiramate. Suggests pain, hyperventilation, or anxiety as etiology for dyspnea.  Impression & Recommendations:  Problem # 1:  SHORTNESS OF BREATH (ICD-786.05) Assessment Unchanged  Orders: Est. Patient Level III [id number]  She has fluctuating topiramate intake and gives fluctuating hx if dyspnea is less while off topiramate. However, it seems that in May/June 2011 completely stopping and restarting topirmate did little to alter dyspnea.   PFTs from feb 2011 show some airtrapping and low dlco suggesting some mild copd from pripr smoking but dyspnea is out of proportion to this. Dyspnea has not improved despite changint ACE inhibitor to ARB in feb 2011 and coming off carvedilol in April 2011. CPST 01/02/2010 suggests pain (has chronic back pain), versus anxeity or hyperventilation as etiologyfor dyspnea. I shared this thought with her but is reluctant to accept this as etiology. Recommened she start pulmonary rehab. Will reasess in few months  Medications Added to Medication List This Visit: 1)  Hydrocodone-acetaminophen 7.5-325 Mg Tabs (Hydrocodone-acetaminophen) .Marland Kitchen.. 1 to tablets pi as needed every 4 to 6 hrs 2)  Proair Hfa 108 (90 Base) Mcg/act Aers (Albuterol sulfate) .Marland Kitchen.. 1-2 puffs every 4-6 hours as needed  Other Orders: Rehabilitation Referral (Rehab)  Patient Instructions: 1)  It is unclear why exactly you are short of breath 2)  The bike tes suggests pain, anxiety or hyperventilation as cause 3)  You should attend pulmonary rehab 4)  REturn in 4  months or sooner if worse 5)  Take a sample of albuterol - see ifi it helps 6)  DO NOT USE ALBUTEROL more than 4 times a day Prescriptions: PROAIR HFA 108 (90 BASE) MCG/ACT  AERS (ALBUTEROL SULFATE) 1-2 puffs every 4-6 hours as needed  #1 x 0   Entered and Authorized by:   Brand Males MD   Signed by:   Brand Males MD on 01/14/2010   Method used:   Electronically to        [facility] # [id number]* (retail)       [address]       [place], [place]  [address]       Ph: [phone] or [phone]       Fax: [phone]   RxID:   [id number]

## 2010-08-29 NOTE — Assessment & Plan Note (Signed)
Summary: WORSENING ABD. PAIN          (DR.KAPLAN PT.)           DEBORAH   History of Present Illness Visit Type: Follow-up Visit Primary GI MD: Erskine Emery MD [facility] Primary Provider: Benay Pillow, MD Requesting Provider: n/a Chief Complaint: generilized abdominal pain, pt also having SOB now. History of Present Illness:   65 YO FEMALE KNOWN TO DR.KAPLAN . SHE HAS HX OF C.A.D. AND IS S/P MI 8/10. SHE HAS HX OF GERD AND ESOPHAGEAL SRICTURE,AS WELL AS IBS, DIVERTICULOSIS. SHE C/O SOB SINCE HER M.I. SHE COMES IN TODAY WITH C/O LOWER ABDOMINAL PAINOVER THE PAST WEEK.SHESAYS IT BOTHERS HER MORE AT NIGHT AND KEEPS HER AWAKE.PAIN IS MID /LOWER ABD,WORSE ON LEFT,WITH GNAWING AND CRAMPING. NO FEVER. NO N/V. SHE FEELS BLOATED AND MISERABLE. BM'S HAVE BEEN LOOSE,2-3 /DAY,NO BLOOD.   GI Review of Systems    Reports abdominal pain and  bloating.     Location of  Abdominal pain: lower abdomen.    Denies acid reflux, belching, chest pain, dysphagia with liquids, dysphagia with solids, heartburn, loss of appetite, nausea, vomiting, vomiting blood, and  weight loss.      Reports change in bowel habits, diarrhea, and  diverticulosis.     Denies anal fissure, black tarry stools, fecal incontinence, heme positive stool, hemorrhoids, irritable bowel syndrome, jaundice, light color stool, liver problems, rectal bleeding, and  rectal pain.    Current Medications (verified): 1)  Amitriptyline Hcl 50 Mg  Tabs (Amitriptyline Hcl) .... One Tablet Every Night At Bedtime 2)  Hydrocodone-Acetaminophen 5-500 Mg Tabs (Hydrocodone-Acetaminophen) .... As Needed 3)  Dulcolax 5 Mg Tbec (Bisacodyl) .... As Needed 4)  Plavix 75 Mg Tabs (Clopidogrel Bisulfate) .Marland Kitchen.. 1 Tab Once Daily 5)  Lisinopril 2.5 Mg Tabs (Lisinopril) .... Take One Tablet By Mouth Daily 6)  Crestor 40 Mg Tabs (Rosuvastatin Calcium) .... Take One- Half  Tablet By Mouth Daily. 7)  Carvedilol 12.5 Mg Tabs (Carvedilol) .... Take One Tablet By Mouth Twice A  Day 8)  Protonix 40 Mg Solr (Pantoprazole Sodium) .... Take 1 Tqab Daily 12 Hours Away From Plavix 9)  Topamax 25 Mg Tabs (Topiramate) .... 3 Tab At Bedtime  Allergies (verified): 1)  ! Sulfa  Past History:  Past Surgical History: Last updated: 04/24/2009 T A H and B S O 17 left ear operations S/P CERVICAL NECK SURGERY 8/08 S/P LUMBAR SURGERY 8/09  CAD-S/P BARE METAL STENT LAD 8/10  Past Medical History: Current Problems: CAD-S/P MI 8/10 DIVERTICULOSIS HYPERLIPIDEMIA  COLONIC POLYPS, ADENOMATOUS (ICD-211.3) MASTOIDITIS (ICD-383.9) CONSTIPATION (ICD-564.00) GERD (ICD-530.81)/SRICTURE DILATED 10/10 IBS (ICD-564.1)  Family History: Family History of Colon Cancer:Mother and brother Bladder Cancer: Brother  Social History: Occupation: Unemployed Patient currently smokes. 1ppd   quit- in October Alcohol Use - no Daily Caffeine Use -2 Illicit Drug Use - no  Review of Systems       The patient complains of fatigue and shortness of breath.  The patient denies allergy/sinus, anemia, anxiety-new, arthritis/joint pain, back pain, blood in urine, breast changes/lumps, change in vision, confusion, cough, coughing up blood, depression-new, fainting, fever, headaches-new, hearing problems, heart murmur, heart rhythm changes, itching, menstrual pain, muscle pains/cramps, night sweats, nosebleeds, pregnancy symptoms, skin rash, sleeping problems, sore throat, swelling of feet/legs, swollen lymph glands, thirst - excessive , urination - excessive , urination changes/pain, urine leakage, vision changes, and voice change.         ROS OTHERWISE AS IN HPI  Vital Signs:  Patient profile:   65 year old female Height:      63 inches Weight:      158 pounds BSA:     1.75 O2 Sat:      99 % Pulse rate:   84 / minute Pulse rhythm:   regular BP sitting:   120 / 88  (left arm)  Vitals Entered By: Frohna Deborra Medina) (August 14, 2009 10:35 AM)  Physical Exam  General:  Well  developed, well nourished, no acute distress. Head:  Normocephalic and atraumatic. Eyes:  PERRLA, no icterus. Lungs:  decreased BS bilateraL lBASES Heart:  Regular rate and rhythm; no murmurs, rubs,  or bruits. Abdomen:  SL DIDTENDE, BS+,TENDER LOWER ABDOMEN,LEFT GREATER THAN RIGHT, NO REBOUND Rectal:  NOT DONE Extremities:  No clubbing, cyanosis, edema or deformities noted. Neurologic:  Alert and  oriented x4;  grossly normal neurologically. Psych:  Alert and cooperative. Normal mood and affect.   Impression & Recommendations:  Problem # 1:  ABDOMINAL PAIN, LEFT LOWER QUADRANT (ICD-789.04) Assessment New 64 YO FEMALE WITH  IBS,DIVERTICULOSIS,WITH ONE WEEK HX OF ABDOMINAL BLOATING AND LOWER ABDOMINAL PAIN. R/O IBS VS,DIVERTICULITIS,VS OTHER  SCHEDULE FOR CT SCAN ABDOMEN AND PELVIS A.S.AP. WILL HOLD ON ABX UNTIL CT REVIEWED LABS FROM 08/13/09 REVIEWED;WBC6.7,HGB12.0,LFT'S WNL.  Problem # 2:  PERSONAL HX COLONIC POLYPS (ICD-V12.72) Assessment: Comment Only ADENOMATOUS,DUE FOR F/U 2013  Problem # 3:  CORONARY ARTERY DISEASE (ICD-414.00) Assessment: Comment Only  Problem # 4:  HYPERLIPIDEMIA (ICD-272.4) Assessment: Comment Only  Problem # 5:  GERD (ICD-530.81) Assessment: Comment Only STABLE  Other Orders: CT Abdomen/Pelvis with Contrast (CT Abd/Pelvis w/con)  Patient Instructions: 1)  We have  pt scheduled for Thurs 08-16-09 for a CT of the Abdomen and Pelvis. 2)  We scheduled the CT at [place], regarding patient choice of location for the CT. 3)  Contrast and instructions provided. 4)  We will call you with the results.  5)  Copy sent to : Benay Pillow, MD 6)  The medication list was reviewed and reconciled.  All changed / newly prescribed medications were explained.  A complete medication list was provided to the patient / caregiver.

## 2010-08-29 NOTE — Progress Notes (Signed)
Summary: please update patient  Phone Note Outgoing Call   Summary of Call: I spoke to Dr. [name] last week - he is oka switching ace inhibitor to ARB. Please have patient start cozaar 25mg  by mouth daily. I also spokke to Dr. Erling Cruz today. He recommended her take 1.5 tablets (37.5mg ) of the topamax. PAtient should do these changes and see me 3 weeks after these changes. IF dyspnea still persists, then we will do cpst Initial call taken by: Brand Males MD,  October 12, 2009 6:04 PM  Follow-up for Phone Call        [facility] Tilden Dome  October 15, 2009 2:55 PM  Spoke with pt and made aware of the above recs per MR. Pt verbalized understanding.  Appt wirth MR sched for 11/19/09 at 3:50 pm.  Rx for cozaar sent to [facility] [address]. Pt to call if any problems. Follow-up by: Tilden Dome,  October 15, 2009 5:00 PM    New/Updated Medications: COZAAR 25 MG TABS (LOSARTAN POTASSIUM) 1 by mouth once daily Prescriptions: COZAAR 25 MG TABS (LOSARTAN POTASSIUM) 1 by mouth once daily  #30 x 1   Entered by:   Tilden Dome   Authorized by:   Brand Males MD   Signed by:   Tilden Dome on 10/15/2009   Method used:   Electronically to        [facility]. # [id number]* (retail)       [place]       [place], [place]  [address]       Ph: [phone] or [phone]       Fax: [phone]   RxID:   [id number]

## 2010-08-29 NOTE — Procedures (Signed)
Summary: Colonoscopy  Patient: [name] Note: All result statuses are Final unless otherwise noted.  Tests: (1) Colonoscopy (COL)   COL Colonoscopy           [facility]     [place],   [address]           COLONOSCOPY PROCEDURE REPORT           PATIENT:  [name], [name]  MR#:  [id number]     BIRTHDATE:  [birth year], 64 yrs. old  GENDER:  female     ENDOSCOPIST:  [name], MD, [facility]           PROCEDURE DATE:  05/18/2010     PROCEDURE:  Colonoscopy with snare polypectomy, Colonoscopy with     tumor ablation     ASA CLASS:  Class III     INDICATIONS:  heme positive stool, Anemia acute/subacute blood     loss in setting of warfarin, ASA and Plavix (off warfarin and     Plavix now).     MEDICATIONS:   Fentanyl 125 mcg IV, Versed 12 mg IV           DESCRIPTION OF PROCEDURE:   After the risks benefits and     alternatives of the procedure were thoroughly explained, informed     consent was obtained.  Digital rectal exam was performed and     revealed no abnormalities.   The Pentax Colonoscope [id number] and     [id number]) endoscope was introduced through the anus and     advanced to the cecum, which was identified by both the appendix     and ileocecal valve, without limitations.  The quality of the prep     was adequate, using Colyte.  The instrument was then slowly     withdrawn as the colon was fully examined.     Withdrawal time  = 27 minutes.     <<PROCEDUREIMAGES>>           FINDINGS:  A sessile polyp was found in the cecum. It was 15 mm in     size. Polyp was snared, then cauterized with monopolar cautery.     Retrieval was successful. snare polyp. Piecemeal polypectomy     followed by APC ablation.  A sessile polyp was found in the     sigmoid colon. It was 8 - 10 mm in size. Polyp was snared, then     cauterized with monopolar cautery. Retrieval was successful. snare     polyp  This was otherwise a  normal examination of the colon.     Retroflexed views in the rectum revealed no abnormalities.    The     scope was then withdrawn from the patient and the procedure     completed.           COMPLICATIONS:  None     ENDOSCOPIC IMPRESSION:     1) 15 mm sessile polyp in the cecum - removed and site ablated     with argon plasma coagulator     2) 8 - 10 mm sessile polyp in the sigmoid colon - removed     3) Otherwise normal examination, adequate prep     4) prior adenoma removed 04/2007 ([name])     RECOMMENDATIONS:     1) Continue ASA (needs for CAD and stent)     2) Will not  do further GI work-up at this time. She had     significant Hgb drop and some GI bleeding but on triple     antithrombosis Tx. these polyps may have leaked some blood but     appearance did not suggest that. She could have lost blood from     small small bowel lesions. Would have to show further blood loss     and bleeding before small bowel evaluation. Dr. [name] in     agreement.     3) Once pathology reviewed, will recommend routine follow-up     plans. Ordinarily would repeat a colonoscopy at 6 months to ensure     removal of sessile cecal polyp but given her comorbidities will     probably recommend a longer interval or an office reassessment at     6 months.     REPEAT EXAM:  In for Colonoscopy, pending biopsy results.           [name], MD, [facility]           CC:  Hillary Bow, MD     Jana Hakim, MD     Erskine Emery, MD           n.     eSIGNED:   [name] at 05/18/2010 09:34 AM           [name], [id number]  Note: An exclamation mark (!) indicates a result that was not dispersed into the flowsheet. Document Creation Date: 05/18/2010 9:35 AM _______________________________________________________________________  (1) Order result status: Final Collection or observation date-time: 05/18/2010 09:20 Requested date-time:  Receipt date-time:  Reported date-time:    Referring Physician:   Ordering Physician: Silvano Rusk [phone]) Specimen Source:  Source: Tawanna Cooler Order Number: [id number] Lab site:   Appended Document: Colonoscopy   Colonoscopy  Procedure date:  05/18/2010  Findings:          1) 15 mm sessile polyp in the cecum - removed and site ablated     with argon plasma coagulator ADENOMA     2) 8 - 10 mm sessile polyp in the sigmoid colon - removed ADENOMA     3) Otherwise normal examination, adequate prep     4) prior adenoma removed 04/2007 ([name])  Comments:      Repeat colonoscopy in 6 months.      Procedures Next Due Date:    Colonoscopy: 11/2010

## 2010-08-29 NOTE — Letter (Signed)
Summary: Cardiac Rehab Program  Cardiac Rehab Program   Imported By: Jamelle Haring 07/31/2009 12:14:29  _____________________________________________________________________  External Attachment:    Type:   Image     Comment:   External Document

## 2010-08-29 NOTE — Progress Notes (Signed)
----   Converted from flag ---- ---- 12/10/2009 2:59 PM, Inda Castle MD wrote: ok to refill  ---- 12/10/2009 2:28 PM, Marisue Humble NCMA wrote: Amy saw this pt in 03/29/09 and perscribed Amitriptyline HCL.  She also saw pt in Jan 2011.  Amy got a request from pharmacy to fill this.  Is it okay to fill?  She has not seen you since she saw Amy in 03/2009. ------------------------------

## 2010-08-29 NOTE — Consult Note (Signed)
Summary: Consultation Report - [facility]  Consultation Report - [facility]   Imported By: Marilynne Drivers 09/03/2009 09:22:50  _____________________________________________________________________  External Attachment:    Type:   Image     Comment:   External Document

## 2010-08-29 NOTE — Letter (Signed)
Summary: Cardiac Catheterization Instructions- Main Lab  [place], [place]  [id number] [place]   [place], [place] [address]   Phone: [phone]  Fax: [phone]     07/24/2010 MRN: [id number]  [facility] [place], [place]  [address]  Dear Ms. Herrera,   You are scheduled for Cardiac Catheterization on 07/26/10              with Dr.Franchot Pollitt.  Please arrive at the [facility] at 1:00      p.m. on the day of your procedure.  1. DIET     _x___ Nothing to eat or drink after midnight except your medications with a sip of water.   2. MAKE SURE YOU TAKE YOUR ASPIRIN.      __x__ YOU MAY TAKE ALL of your remaining medications with a small amount of water.      3. Plan for one night stay - bring personal belongings (i.e. toothpaste, toothbrush, etc.)  4. Bring a current list of your medications and current insurance cards.  5. Must have a responsible person to drive you home.   6. Someone must be with yu for the first 24 hours after you arrive home.  7. Please wear clothes that are easy to get on and off and wear slip-on shoes.  *Special note: Every effort is made to have your procedure done on time.  Occasionally there are emergencies that present themselves at the hospital that may cause delays.  Please be patient if a delay does occur.  If you have any questions after you get home, please call the office at the number listed above.  [name] RN

## 2010-08-29 NOTE — Progress Notes (Signed)
  Phone Note Outgoing Call   Call placed by: Sharol Roussel,  August 20, 2009 10:34 AM Call placed to: Patient Summary of Call: Per Amy Esterwood PA-C , since the [place] has denied the CT and we havn't seen the pt since 1-18, I am to call the pt and triage her situation.  I had to leave a message for the pt to call me. I called and cancelled the CT for tomorrow 08-21-09 and called Rose at [employer] to let her know. Initial call taken by: Sharol Roussel,  August 20, 2009 10:38 AM  Follow-up for Phone Call        I spoke to pt and she is doing much better.  She has no fever and her pain gone.  She said if she has any more problems she will call to se [name] or Amy Esterwood PA-C.  Follow-up by: Sharol Roussel,  August 20, 2009 2:56 PM  Additional Follow-up for Phone Call Additional follow up Details #1::        OK,GREAT...thanks Additional Follow-up by: Leotis Pain,  August 20, 2009 3:03 PM

## 2010-08-29 NOTE — Medication Information (Signed)
Summary: [facility]  Anticoagulant Therapy  Managed by: Freddrick March, RN, BSN Referring MD: Bing Quarry, MD PCP: Benay Pillow, MD- PMD, Dr Addison Lank - Cardiologist. Dr Erling Cruz - Neurology Supervising MD: Ron Parker MD, Dellis Filbert Indication 1: Pulmonary Embolism Lab Used: [facility] Care  Site: [facility] 28.4 INR POC 2.4 INR RANGE 2.0-3.0  Dietary changes: no     Bleeding/hemorrhagic complications: no     Any changes in medication regimen? no     Any missed doses?: no       Is patient compliant with meds? yes      Comments: INR 2.85 on 04/16/10 discharged from [facility] s/p PE on 2.5mg  daily.   Allergies: 1)  ! Sulfa  Anticoagulation Management History:      Her anticoagulation is being managed by telephone today.  Negative risk factors for bleeding include an age less than 80 years old.  The bleeding index is 'low risk'.  Positive CHADS2 values include History of HTN.  Negative CHADS2 values include Age > 78 years old.  Her last INR was 0.9 ratio.  Prothrombin time is 28.4.  Anticoagulation responsible Erienne Spelman: Ron Parker MD, Dellis Filbert.  INR POC: 2.4.    Anticoagulation Management Assessment/Plan:      The next INR is due 04/24/2010.  Results were reviewed/authorized by Freddrick March, RN, BSN.  She was notified by Freddrick March RN.         Current Anticoagulation Instructions: INR 2.4  Spoke with [facility] RN while at pt's home.  Continue on same dosage 2.5mg  daily.  Recheck on Wednesday 04/24/10.

## 2010-08-29 NOTE — Letter (Signed)
Summary: CPST- R/O Contraindications  [facility]  [address], [place] [address]   Phone: [phone]  Fax: [phone]    Patient's Name: [name] Date of Birth: Dec 17, 1945 MRN: [id number]  *********Rule out Contraindications**************** Absolute                                                                                                                           ___ Acute MI (3-5 Days)                                 ___ Unstable Angina                                          ___ Uncontrolled arrhythmias causing symptoms or hemodynamic compromise. ___ Syncope                                                     ___ Active endocarditis                                         ___ Acute Myocarditis/Pericarditis                        ___ Symptomatic severe aortic stenosis  ___ Acute Pulmonary embolus or pulmonary infarction                ___ Uncontrolled Heart Failure  ___ Thrombosis of lower extremitie ___ Suspected dissecting aneurysm ___ Uncontrolled Asthma                          ___ Pulmonary Edema                                        ___ RA desat @ rest<85%                                      ___ Repiratory Failure                                         ___ Acute noncardiopulmonary disorder that may affect exercise performance or be         aggravated by exercise (ie infection, renal failure,  thyrotoxicosis) .                               ___ Mental impairment leading to inabliity to cooperate   Relative ___ Left main coronary stenosis or its equivalent ___ Moderate stentoic valvular heart disease ___ Severe untreated arterial hypertension @ rest (<200 mmHg             99991111 Diastolic ___ Tachy/Brady Arrhythmias ___ High- degree artioventricular block ___ Hypertrophic cardiomyopathy ___ Significant pulmonary hypertension ___ Advanced or complicated pregnancy ___ Electrolyte abnormalities ___ Orthopedic impairment  that compromises exercise performance  none  Brand Males MD    [facility]

## 2010-08-29 NOTE — Miscellaneous (Signed)
Summary: [facility] Orders   [facility] Orders   Imported By: Sallee Provencal 06/03/2010 08:51:01  _____________________________________________________________________  External Attachment:    Type:   Image     Comment:   External Document

## 2010-08-29 NOTE — Progress Notes (Signed)
Summary: breathing problems  Phone Note Call from Patient Call back at Home Phone [phone]   Caller: Patient Reason for Call: Talk to Nurse Complaint: Breathing Problems Details for Reason: should pt go see asthma md.  Initial call taken by: Neil Crouch,  August 17, 2009 11:09 AM  Follow-up for Phone Call        Spoke with patient. Pt. states she was at [facility] this week  with symtoms of SOB.  According to pt. she was seen by Dr. Harrington Challenger, which mention she may need a asthma, or a Pulmonologist MD.   Pt. states she is just concern because no one has called her back about it. RN let pt. know on the cardiac consult Dr. Harrington Challenger states pt. needs further evaluation will discuss with Dr. [name]. Okay with pt.  Pt. has an appointment to see Dr. [name] on 09/04/09. Carollee Sires, RN, BSN  August 17, 2009 12:02 PM   Additional Follow-up for Phone Call Additional follow up Details #1::        Per Dr [name] the pt needs Room Air ABG and BMP.  The pt also needs a referral to Pulmonary for CO2 of 14, SOB and Asthma.  The pt should also decrease her Topamax to once a day. [name], RN, BSN  August 23, 2009 6:15 PM    Additional Follow-up for Phone Call Additional follow up Details #2::    Patient called and also spoke with Dr. Erling Cruz. Had previously spoken to Dr. Justin Mend.  There is suspicion that her dyspnea could be secondary to metabolic acidosis as a side effect from her Topamax.  Dr. Tressia Danas team had started her back on the drug in Novemer, with a gradual increase to three tabs per day.  Her CO2 levels dropped about that time (20,19, 17, 14)  I have spoken with her recently about this.  Dr. Erling Cruz and I both called her tonight.  She will drop her dose to one tab per day, and recheck labs next week.  Follow-up by: Wadie Lessen, MD, [facility],  August 24, 2009 6:26 PM  Additional Follow-up for Phone Call Additional follow up Details #3:: Details for Additional Follow-up Action Taken: Bethel Born  called patient today.  Pt is scheduled for ABG and BMP on 08-30-09. Chanetta Marshall RN BSN  August 29, 2009 6:00 PM

## 2010-08-29 NOTE — Progress Notes (Signed)
Summary: results  Phone Note Call from Patient Call back at Home Phone [phone]   Caller: Patient Call For: Jaremy Nosal Summary of Call: pt wants pft results.  Initial call taken by: Cooper Render, CNA,  September 12, 2009 11:51 AM  Follow-up for Phone Call        Anderson Malta, do you know if MR has seen PFT's or if he will be coming by the office sometime this week?Francesca Jewett [facility]  September 12, 2009 12:17 PM  please advise. [place] Bing CMA  September 12, 2009 1:11 PM  pt called back today.  Would like someone to call her back and let her know what is going on.  Follow-up by: Zigmund Gottron,  September 13, 2009 8:19 AM  Additional Follow-up for Phone Call Additional follow up Details #1::        research office fax: is [phone]. Pls use this fax when I do the 2100 rotations and just email me or page me Additional Follow-up by: Brand Males MD,  September 13, 2009 3:33 PM    Additional Follow-up for Phone Call Additional follow up Details #2::    I just faxed a copy of PFT to research fax. [place] Bing CMA  September 14, 2009 5:04 PM   Additional Follow-up for Phone Call Additional follow up Details #3:: Details for Additional Follow-up Action Taken: reviewed pft date 09/10/2009. Arlyce Harman is normal. Lung volumes show mild air trapping and dlco is low. In contex of smoking till Oct 2010, this suggests mild hyperinflation from smoking (mild copd, very early GOld stage 0-1 COPD). Differential dx for breathing test is pulmonary fibrosis but I do not see it on CT chest 05/31/2005, 05/01/2008 and 07/09/2009.   Still not sure if current shortness of breath can be explained by this mild level of copd. Options are to try inhaler Rx and see how it goes versus doing CPST.   Best she come in and I explain  I just explained above to her over phone. She is willing to come and hear in office. Pls give appt  Additional Follow-up by: Brand Males MD,  September 15, 2009 7:56 AM  pt  schedueld to see MR on 09/24/09 at 12:00. pt aware. [place] Bing CMA  September 17, 2009 4:16 PM

## 2010-08-29 NOTE — Progress Notes (Signed)
Summary: refill  Phone Note Call from Patient Call back at Home Phone [phone]   Caller: Patient Call For: Daquawn Seelman Reason for Call: Refill Medication Summary of Call: Patient needs refills on her Plavix. Initial call taken by: Ronalee Red,  August 16, 2010 12:17 PM  Follow-up for Phone Call        Advised pt we are sending perscription for Pantoprazole Sodium.  Follow-up by: Sharol Roussel,  August 16, 2010 3:21 PM

## 2010-08-29 NOTE — Progress Notes (Signed)
Summary: pt pale and weak  Phone Note From Other Clinic   Caller: Colletta Maryland w/ [facility] Request: Talk with Nurse, Talk with Sparrow Sanzo Summary of Call: pt is very pale and weak dose nurse  need to get a cbc Initial call taken by: Shelda Pal,  May 10, 2010 12:05 PM  Follow-up for Phone Call        pt feels very weak and pale, has not taken valium or vicodin, INR 3.0 today BP 90/58 (10/7 BP was 90/50) HR 88, lungs CTA, no SOB O2 94% on RA, meds reviewed pt is on metoprolol tartrate 25mg  bid, lisinopril 10mg  qd, lasix 40mg  daily, and bisoprolol 7.5mg  qd, discussed w/Lauren home heatlh RN will draw cbc and bmet today, will d/c metoprolol and cont. bisoprolol, RN will go back on Mon to check on pt Kevan Rosebush, RN  May 10, 2010 12:16 PM

## 2010-08-29 NOTE — Assessment & Plan Note (Signed)
Summary: f80m   Visit Type:  Follow-up Referring Provider:  Dr [name] Cardiology Primary Provider:  Benay Pillow, MD- PMD, Dr Addison Lank - Cardiologist. Dr Erling Cruz - Neurology  CC:  No complaints.  History of Present Illness: [name] seems to be doing better.  It has been a challenging period with her, but her son is watching her and her medications now, and informing us of any major issues at this point.  We are pleased overall with how things are going. She recently hurt her hand, and did have a fracture.  Of note also, workup in the hospital was ok.  Dr. Burt Knack performed cath and she had a CT scan.  Problems Prior to Update: 1)  Coronary Atherosclerosis Native Coronary Artery  (ICD-414.01) 2)  Unspecified Anemia  (ICD-285.9) 3)  Dyspnea  (ICD-786.05) 4)  Hypertension  (ICD-401.9) 5)  Hx of Myocardial Infarction  (ICD-410.90) 6)  Shortness of Breath  (ICD-786.05) 7)  Diverticulosis-colon  (ICD-562.10) 8)  Abdominal Pain, Generalized  (ICD-789.07) 9)  Abdominal Pain, Left Lower Quadrant  (ICD-789.04) 10)  Tobacco Abuse  (ICD-305.1) 11)  Personal Hx Colonic Polyps  (ICD-V12.72) 12)  Coronary Artery Disease  (ICD-414.00) 13)  Hyperlipidemia  (ICD-272.4) 14)  Dysphagia Unspecified  (ICD-787.20) 15)  Gerd  (ICD-530.81) 16)  Nausea  (ICD-787.02) 17)  Mi  (ICD-410.90) 18)  Hypertension, Benign  (ICD-401.1) 19)  Tobacco Abuse  (ICD-305.1) 20)  Left Ventricular Function, Decreased  (ICD-429.2) 21)  Cad, Native Vessel  (ICD-414.01) 22)  Chest Pain  (ICD-786.50) 23)  Fm Hx Malignant Neoplasm Gastrointestinal Tract  (ICD-V16.0) 24)  Dyspepsia  (ICD-536.8) 25)  Colonic Polyps, Adenomatous  (ICD-211.3) 26)  Mastoiditis  (ICD-383.9) 27)  Constipation  (ICD-564.00) 28)  Gerd  (ICD-530.81) 29)  Ibs  (ICD-564.1)  Current Medications (verified): 1)  Amitriptyline Hcl 50 Mg  Tabs (Amitriptyline Hcl) .... One Tablet Every Night At Bedtime 2)  Crestor 20 Mg Tabs (Rosuvastatin Calcium) ....  Take One Tablet By Mouth Daily. 3)  Protonix 40 Mg Solr (Pantoprazole Sodium) .... As Needed 4)  Topamax 25 Mg Tabs (Topiramate) .... Take 2 Tab By Mouth At Bedtime 5)  Proair Hfa 108 (90 Base) Mcg/act Aers (Albuterol Sulfate) .... Inhale 1-2 Puffs By Mouth Every 4-6 Hours As Needed 6)  Lisinopril 10 Mg Tabs (Lisinopril) .... Take One Tablet Every Other Day 7)  Bisoprolol Fumarate 10 Mg Tabs (Bisoprolol Fumarate) .... Take 1 1/2 Daily 8)  Aspir-Low 81 Mg Tbec (Aspirin) .... Take One Daily 9)  Vitamin D 2000 Unit Tabs (Cholecalciferol) .... Take 1 Tablet By Mouth Two Times A Day  Allergies: 1)  ! Sulfa  Vital Signs:  Patient profile:   65 year old female Height:      63 inches Weight:      146.50 pounds BMI:     26.05 Pulse rate:   76 / minute Pulse rhythm:   regular Resp:     18 per minute BP sitting:   130 / 88  (left arm) Cuff size:   large  Vitals Entered By: Sidney Ace (August 19, 2010 4:06 PM)  Physical Exam  General:  Well developed, well nourished, in no acute distress. Head:  normocephalic and atraumatic Eyes:  PERRLA/EOM intact; conjunctiva and lids normal. Lungs:  Currently clear to auscultation and percussion Heart:  PMI non displaced.  Normal S1 and S2.  No definite murmur noted.  Abdomen:  Bowel sounds positive; abdomen soft and non-tender without masses, organomegaly, or hernias noted. No hepatosplenomegaly.  Pulses:  pulses normal in all 4 extremities Extremities:  No clubbing or cyanosis. Neurologic:  Alert and oriented x 3.   EKG  Procedure date:  08/19/2010  Findings:      NSR.  Left axis deviation.  Old anterior MI.   CT Scan  Procedure date:  07/26/2010  Findings:      IMPRESSION:   1.  No evidence of pulmonary embolism to the large segmental level. Smaller emboli cannot be excluded, given motion artifact. 2.  Cardiomegaly. 3.  Probable ductus diverticulum off the undersurface of the transverse aorta.  This is unchanged.  Correlate with  the significant remote thoracic trauma, as a pseudoaneurysm could look similar but is felt less likely. 4.  Mild T10 compression deformity, stable.   Read By:  Areta Haber,  M.D.      Cardiac Cath  Procedure date:  07/27/2010  Findings:      FINAL ASSESSMENT: 1. Patent left anterior descending stent with mild in-stent     restenosis. 2. Nonobstructive right coronary artery and left circumflex stenoses.   PLAN:  The patient will continue with medical management for her coronary artery disease.  Considering her history of subsegmental pulmonary embolus, we will check a CT angiogram of the chest to rule out recurrent pulmonary embolus as a source of her symptoms.  The patient will follow up with Dr. [name].         Juanda Bond. Burt Knack, MD  Impression & Recommendations:  Problem # 1:  CORONARY ATHEROSCLEROSIS NATIVE CORONARY ARTERY (ICD-414.01) Has some partial, but non significant narrowing.  At this time, we favor continued medical management. Her updated medication list for this problem includes:    Lisinopril 10 Mg Tabs (Lisinopril) .Marland Kitchen... Take one tablet every other day    Bisoprolol Fumarate 10 Mg Tabs (Bisoprolol fumarate) .Marland Kitchen... Take 1 1/2 daily    Aspir-low 81 Mg Tbec (Aspirin) .Marland Kitchen... Take one daily  Orders: TLB-BMP (Basic Metabolic Panel-BMET) (99991111) TLB-Lipid Panel (80061-LIPID) TLB-Hepatic/Liver Function Pnl (80076-HEPATIC) EKG w/ Interpretation (93000) TLB-CBC Platelet - w/Differential (85025-CBCD)  Problem # 2:  SHORTNESS OF BREATH (ICD-786.05) No evidence of PE at this point.  Currently of anticoagulation.  Would favor not change in current treatment plan. Her updated medication list for this problem includes:    Lisinopril 10 Mg Tabs (Lisinopril) .Marland Kitchen... Take one tablet every other day    Bisoprolol Fumarate 10 Mg Tabs (Bisoprolol fumarate) .Marland Kitchen... Take 1 1/2 daily    Aspir-low 81 Mg Tbec (Aspirin) .Marland Kitchen... Take one daily  Problem # 3:  HYPERLIPIDEMIA  (ICD-272.4) on lipid lowering therapy.  Her updated medication list for this problem includes:    Crestor 20 Mg Tabs (Rosuvastatin calcium) .Marland Kitchen... Take one tablet by mouth daily.  Orders: TLB-BMP (Basic Metabolic Panel-BMET) (99991111) TLB-Lipid Panel (80061-LIPID) TLB-Hepatic/Liver Function Pnl (80076-HEPATIC) TLB-CBC Platelet - w/Differential (85025-CBCD)  Patient Instructions: 1)  Your physician recommends that you have lab work today: BMP, CBC, LIPID, LIVER 2)  Your physician recommends that you continue on your current medications as directed. Please refer to the Current Medication list given to you today. 3)  Your physician wants you to follow-up in:  3 MONTHS.  You will receive a reminder letter in the mail two months in advance. If you don't receive a letter, please call our office to schedule the follow-up appointment.

## 2010-08-29 NOTE — Miscellaneous (Signed)
Summary: Orders Update  Clinical Lists Changes  Orders: Added new Test order of TLB-Hepatic/Liver Function Pnl (80076-HEPATIC) - Signed Added new Test order of TLB-Lipid Panel (80061-LIPID) - Signed Added new Test order of TLB-BMP (Basic Metabolic Panel-BMET) (99991111) - Signed Added new Test order of TLB-CBC Platelet - w/Differential (85025-CBCD) - Signed

## 2010-08-29 NOTE — Progress Notes (Signed)
Summary: Pt informed of date change of CT  Phone Note Outgoing Call   Call placed by: Sharol Roussel,  August 15, 2009 4:47 PM Call placed to: Patient Summary of Call: LM twice for pt to Advise we had to cancel the CT scheduled for tomorrow 08-16-09 and we changed it to 08-21-09 at 9:30 AM. The insurance company [employer] has not given approval yet.  Initial call taken by: Sharol Roussel,  August 15, 2009 4:48 PM  Follow-up for Phone Call        Pt informed of the date change.  Follow-up by: Sharol Roussel,  August 16, 2009 8:23 AM

## 2010-08-29 NOTE — Assessment & Plan Note (Signed)
Summary: 2 month rov   Visit Type:  2 months follow up Referring Provider:  Dr [name] Cardiology Primary Provider:  Benay Pillow, MD- PMD, Dr Addison Lank - Cardiologist. Dr Erling Cruz - Neurology  CC:  Sob.  History of Present Illness: She now is more short of breath again.  Her Topomax is back up to two tabs per day  (50mg ) total.  She was at one dose, then her headaches got so bad that she need something done, and this was the treatment.  About two weeks ago she was doing things around the house and she felt as though she was going to pass out.  She felt real weak.  When she walks she will get a little tightness in the chest.    Current Medications (verified): 1)  Amitriptyline Hcl 50 Mg  Tabs (Amitriptyline Hcl) .... One Tablet Every Night At Bedtime 2)  Dulcolax 5 Mg Tbec (Bisacodyl) .... As Needed 3)  Plavix 75 Mg Tabs (Clopidogrel Bisulfate) .Marland Kitchen.. 1 Tab Once Daily 4)  Crestor 20 Mg Tabs (Rosuvastatin Calcium) .... Take One Tablet By Mouth Daily. 5)  Protonix 40 Mg Solr (Pantoprazole Sodium) .... Take 1 Tqab Daily 12 Hours Away From Plavix 6)  Topamax 25 Mg Tabs (Topiramate) .... Take 2 Tab By Mouth At Bedtime 7)  Cozaar 25 Mg Tabs (Losartan Potassium) .Marland Kitchen.. 1 By Mouth Once Daily  Allergies: 1)  ! Sulfa  Past History:  Past Medical History: Last updated: 09/05/2009 Current Problems: CAD-S/P MI 8/10 DIVERTICULOSIS HYPERLIPIDEMIA  COLONIC POLYPS, ADENOMATOUS (ICD-211.3) MASTOIDITIS (ICD-383.9) CONSTIPATION (ICD-564.00) GERD (ICD-530.81)/SRICTURE DILATED 10/10 IBS (ICD-564.1) Myocardial Infarction Hypertension  Past Surgical History: Last updated: 04/24/2009 T A H and B S O 17 left ear operations S/P CERVICAL NECK SURGERY 8/08 S/P LUMBAR SURGERY 8/09  CAD-S/P BARE METAL STENT LAD 8/10  Family History: Last updated: 08/14/2009 Family History of Colon Cancer:Mother and brother Bladder Cancer: Brother  Social History: Last updated: 09/05/2009 Occupation:  Unemployed Patient currently smokes. 1ppd x 30    quit- in October 2010 Alcohol Use - no Daily Caffeine Use -2 Illicit Drug Use - no Divorced  Vital Signs:  Patient profile:   65 year old female Height:      63 inches Weight:      159.50 pounds BMI:     28.36 Pulse rate:   105 / minute Pulse rhythm:   irregular Resp:     18 per minute BP sitting:   140 / 90  (left arm) Cuff size:   large  Vitals Entered By: Sidney Ace (November 13, 2009 3:58 PM)  Physical Exam  General:  Well developed, well nourished, in no acute distress. Head:  normocephalic and atraumatic Eyes:  PERRLA/EOM intact; conjunctiva and lids normal. Neck:  No JVD of significane.   Lungs:  breath sounds audible.  Perhaps minimal expiratory wheezes.  No rales  Heart:  Non-displaced PMI, chest non-tender; regular rate and rhythm, S1, S2 without murmurs, rubs or gallops. Carotid upstroke normal, no bruit. Normal abdominal aortic size, no bruits.  Abdomen:  Bowel sounds positive; abdomen soft and non-tender without masses, organomegaly, or hernias noted. No hepatosplenomegaly.   CXR  Procedure date:  10/24/2009  Findings:       PORTABLE CHEST - 1 VIEW    Comparison: 08/13/2009    Findings: Heart and lungs normal.  No pleural fluid or osseous   lesions in one-view.    IMPRESSION:   No active disease in one-view.    Read By:  Fischer, Almira Coaster,  M.D.   Released By:  Juanita Laster,  M.D.  Echocardiogram  Procedure date:  07/31/2009  Findings:      Study Conclusions     Left ventricle: The cavity size was normal. Wall thickness was     increased in a pattern of mild LVH. Systolic function was normal.     The estimated ejection fraction was in the range of 55% to 60%. Wall     motion was normal; there were no regional wall motion abnormalities.  EKG  Procedure date:  11/13/2009  Findings:      Sinus tach.  Biatrial enlargement. Delay in R wave progression.  No acute changes.   Impression &  Recommendations:  Problem # 1:  DYSPNEA (ICD-786.05) Recent dyspnea was associated with increase dose of Topomax, but now has recurred despite that.  Etiology of this is not entirely clear.  Before, CO2 was reduced, prob from Topomax.  Symptoms do seem similar to her.  Meds have been adjusted at present.  The following medications were removed from the medication list:    Carvedilol 12.5 Mg Tabs (Carvedilol) .Marland Kitchen... Take one tablet by mouth twice a day Her updated medication list for this problem includes:    Cozaar 25 Mg Tabs (Losartan potassium) .Marland Kitchen... 1 by mouth once daily  Problem # 2:  HYPERTENSION (ICD-401.9) slightly elevated, so we will need to keep an eye on this.   The following medications were removed from the medication list:    Carvedilol 12.5 Mg Tabs (Carvedilol) .Marland Kitchen... Take one tablet by mouth twice a day Her updated medication list for this problem includes:    Cozaar 25 Mg Tabs (Losartan potassium) .Marland Kitchen... 1 by mouth once daily  Problem # 3:  CORONARY ARTERY DISEASE (ICD-414.00) restenosis could potentially be a component of this, as the symptoms have recurred.  Would require a repeat cath study.  The following medications were removed from the medication list:    Carvedilol 12.5 Mg Tabs (Carvedilol) .Marland Kitchen... Take one tablet by mouth twice a day Her updated medication list for this problem includes:    Plavix 75 Mg Tabs (Clopidogrel bisulfate) .Marland Kitchen... 1 tab once daily  Patient Instructions: 1)  Your physician recommends that you schedule a follow-up appointment in: 6 weeks.

## 2010-08-29 NOTE — Letter (Signed)
Summary: Patient Notice- Polyp Results  [facility]  [address] [place], [place] [address]   Phone: [phone]  Fax: [phone]        May 21, 2010 MRN: [id number]    [facility], [place]  [address]    Dear [facility],  The polyps removed from your colon were adenomatous. This means that they were pre-cancerous or that  they had the potential to change into cancer over time.  In order to make sure that one of the polyps was completely removed, I recommend you have a repeat colonoscopy (by [name]) in about 6 months, if you are not having any other health problems at that time. If you are not contacted by [place] at that time, please call us.  Please call us if you are having persistent problems or have questions about your condition that have not been fully answered at this time.  Sincerely,  [name] MD, [facility]  This letter has been electronically signed by your physician.  Appended Document: Patient Notice- Polyp Results mailed

## 2010-08-29 NOTE — Miscellaneous (Signed)
Summary: Orders Update pft charges  Clinical Lists Changes  Orders: Added new Service order of Carbon Monoxide diffusing w/capacity (94720) - Signed Added new Service order of Lung Volumes (94240) - Signed Added new Service order of Spirometry (Pre & Post) (94060) - Signed 

## 2010-08-29 NOTE — Miscellaneous (Signed)
Summary: [facility] Orders   [facility] Orders   Imported By: Sallee Provencal 06/17/2010 14:53:27  _____________________________________________________________________  External Attachment:    Type:   Image     Comment:   External Document

## 2010-08-29 NOTE — Progress Notes (Signed)
Summary: RESULTS  Phone Note Call from Patient   Caller: Patient Call For: [facility] Summary of Call: CALLING FOR LAB RESULTS Initial call taken by: Gustavus Bryant,  October 01, 2009 11:46 AM  Follow-up for Phone Call        please advise of lab results. [place] Bing CMA  October 01, 2009 11:51 AM   Additional Follow-up for Phone Call Additional follow up Details #1::        Bicarb is NORMAL. I do not think it is the TOPIRAMATE causing shortness of breath anymore. I would like to change her ace inhibitor to DIOVAN or COZAAR but need Dr. Maren Beach clearance on this. If after that she still short of breath, need to do CPST bike test versus empiric COPD Rx verseus empiric rehab. Probably best to do CPST. Please advise patinet about this. I need to talk to Dr. [name]. Do you mind paging him to my cell. Anytime is fine - prefer you do it before 9-10am Additional Follow-up by: Brand Males MD,  October 02, 2009 12:30 AM    Additional Follow-up for Phone Call Additional follow up Details #2::    ATC Dr. [name], but he is out of the office until Monday, so I left a message to call you next week about this patient. In the meantime do you want to speak to someone else in Card office? I have LMTCB with the patient to advise. [place] Bing CMA  October 02, 2009 9:20 AM  Pt advised of results and advised we are waiting to speak to Dr. [name] about changing meds. Also advised onnce meds are changed if she is still SOB then the next step is CPST. Pt stated understanding and will await a call to change meds.  I have LM with Dr. Lucia Gaskins office but he is out of office until Monday. Do you wnat me to contaact another MD or wait for his call? Please advise. [place] Bing CMA  October 02, 2009 11:05 AM   Additional Follow-up for Phone Call Additional follow up Details #3:: Details for Additional Follow-up Action Taken: No need to contact another MD. I have emailed Dr. [name] and we wait now.  THanks, MR Additional Follow-up by: Brand Males MD,  October 02, 2009 5:22 PM

## 2010-08-29 NOTE — Assessment & Plan Note (Signed)
Summary: surgical clearence   Visit Type:  Follow-up Referring Provider:  Dr [name] Cardiology Primary Provider:  Benay Pillow, MD- PMD, Dr Addison Lank - Cardiologist. Dr Erling Cruz - Neurology  CC:  Surgical clearance- back surgery.  History of Present Illness: Patient has been having some back pain, and going down the left leg.  She had an MRI which led to NS evaluation, and recommendation for surgery.  She has been getting sweating spells, and mainly when she is active.  She has been fairly active.  Hardly anything stops her.  She does get soreness in left chest, but thinks she has arthritis.  The shoulder bothers her.  Migraines have settled down.   Her Topomax was increased for that by Dr. Erling Cruz.  Rarely uses hydrocodone.  These episodes of sweating only occur when she is active in the house.  Aspirin bothers her stomach.  Smoked once since last visit.    Current Medications (verified): 1)  Amitriptyline Hcl 50 Mg  Tabs (Amitriptyline Hcl) .... One Tablet Every Night At Bedtime 2)  Plavix 75 Mg Tabs (Clopidogrel Bisulfate) .Marland Kitchen.. 1 Tab Once Daily 3)  Crestor 20 Mg Tabs (Rosuvastatin Calcium) .... Take One Tablet By Mouth Daily. 4)  Protonix 40 Mg Solr (Pantoprazole Sodium) .... As Needed 5)  Topamax 25 Mg Tabs (Topiramate) .... Take 2 Tab By Mouth At Bedtime 6)  Losartan Potassium 50 Mg Tabs (Losartan Potassium) .... Take One Tablet By Mouth Once A Day 7)  Hydrocodone-Acetaminophen 7.5-325 Mg Tabs (Hydrocodone-Acetaminophen) .Marland Kitchen.. 1 To Tablets Pi As Needed Every 4 To 6 Hrs  Allergies: 1)  ! Sulfa  Past History:  Past Medical History: Last updated: 09/05/2009 Current Problems: CAD-S/P MI 8/10 DIVERTICULOSIS HYPERLIPIDEMIA  COLONIC POLYPS, ADENOMATOUS (ICD-211.3) MASTOIDITIS (ICD-383.9) CONSTIPATION (ICD-564.00) GERD (ICD-530.81)/SRICTURE DILATED 10/10 IBS (ICD-564.1) Myocardial Infarction Hypertension  Past Surgical History: Last updated: 04/24/2009 T A H and B S O 17 left ear  operations S/P CERVICAL NECK SURGERY 8/08 S/P LUMBAR SURGERY 8/09  CAD-S/P BARE METAL STENT LAD 8/10  Family History: Last updated: 08/14/2009 Family History of Colon Cancer:Mother and brother Bladder Cancer: Brother  Social History: Last updated: 09/05/2009 Occupation: Unemployed Patient currently smokes. 1ppd x 30    quit- in October 2010 Alcohol Use - no Daily Caffeine Use -2 Illicit Drug Use - no Divorced  Review of Systems       The patient complains of chest pain and dyspnea on exertion.  The patient denies anorexia, fever, weight loss, weight gain, vision loss, decreased hearing, hoarseness, syncope, peripheral edema, prolonged cough, headaches, abdominal pain, melena, hematochezia, severe indigestion/heartburn, and hematuria.         Does complain of some sweating, and this is the worst this has been.  Vital Signs:  Patient profile:   65 year old female Height:      63 inches Weight:      151.25 pounds BMI:     26.89 Pulse rate:   102 / minute Pulse rhythm:   regular Resp:     18 per minute BP sitting:   126 / 80  (left arm) Cuff size:   large  Vitals Entered By: Sidney Ace (March 13, 2010 8:40 AM)  Physical Exam  General:  Anxious white female. Head:  normocephalic and atraumatic Eyes:  PERRLA/EOM intact; conjunctiva and lids normal. Lungs:  Mild expiratory ronchii. Heart:  PMI non displaced.  Normal S1 and S2.  No murmur or rub. Abdomen:  Bowel sounds positive; abdomen soft and non-tender without  masses, organomegaly, or hernias noted. No hepatosplenomegaly. Pulses:  pulses normal in all 4 extremities Extremities:  No clubbing or cyanosis. Neurologic:  Alert and oriented x 3.   EKG  Procedure date:  03/13/2010  Findings:      ST. Possible LAE.  IRBBB.  Borderline ECG.  Echocardiogram  Procedure date:  07/31/2009  Findings:      Study Conclusions     Left ventricle: The cavity size was normal. Wall thickness was     increased in a pattern  of mild LVH. Systolic function was normal.     The estimated ejection fraction was in the range of 55% to 60%. Wall     motion was normal; there were no regional wall motion abnormalities.  Nuclear Study  Procedure date:  06/25/2009  Findings:      Raw Data Images:  Significant breast shadow. Stress Images:  Mildly decreased radiotracer counts in the anteroseptal wall.  Rest Images:  Normal homogeneous uptake in all areas of the myocardium. Subtraction (SDS):  Reversible mild anteroseptal perfusion defect.  Transient Ischemic Dilatation:  1.12  (Normal <1.22)  Lung/Heart Ratio:  .22  (Normal <0.45)   CT Scan  Procedure date:  05/09/2009  Findings:       Findings:  Negative for PE.  No acute aortic abnormality, however   there is a small broad-based aneurysm arising from the   inferolateral aspect of the transverse aortic arch. It measures   approximately 6 - 7 mm in depth and 1.7 cm in transverse dimension   at the base. This is appreciated on images 24 - 26.  No acute   pulmonary findings.  Small hiatal hernia.    There is mild to moderate compression of a mid to lower thoracic   vertebral body with anterior wedging.    Review of the MIP images confirms the above findings.    IMPRESSION:   Negative for PE.  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  Cardiac Cath  Procedure date:  07/09/2009  Findings:      FINDINGS: 1. Hemodynamics; aorta 106/64, LV 128/14. 2. Left ventriculography was not done as the patient just had a left     ventriculogram on December 3. 3. Right coronary artery:  The right coronary artery was dominant     vessel and there was about 40% stenosis proximally and about 40%     stenosis in the midvessel and luminal irregularities throughout,     however, no obstructive disease. 4. Left main:  The left main was free of significant disease. 5. Left circumflex system.  There were mild luminal irregularities in     the left  circumflex.  There was a small first obtuse marginal,     medium-sized second obtuse marginal, and a large PL/OM. 6. LAD system.  There was a long stented area in the proximal LAD.  In     the proximal portion of the stent, there was about 30% in-stent     restenosis at the site of the balloon angioplasty done on December     6.  There was about a 30 to 40% stenosis at the takeoff of the     moderate-sized second diagonal.  Otherwise, there were some luminal     irregularities in the LAD.   IMPRESSION:  I do not see any obstructive lesions that could be the cause of her shortness of breath.  Her left ventricular end-diastolic pressure is not significantly elevated (14 mmHg).  I suspect that her shortness of breath may be due to her chronic obstructive pulmonary disease.  We will repeat an echo to look at her valves, etc., and PE CT to rule out PE.  if these are okay, I think she likely could be discharged home this evening.  We would continue aspirin and Plavix, and would consider use of some inhalers.         Loralie Champagne, MD Electronically Signed  Impression & Recommendations:  Problem # 1:  CORONARY ARTERY DISEASE (ICD-414.00) Continues with sweating spells which she thinks are severe.  CPX could not be interpreted, and last nuclear study impacted findings wise with prior ASMI.  She needs back surgery, but says the sweating spells are profound.  As such, think best option is repeat cath study to document vessel patency.  Large workup has been undertaken in past.  Options and risks discussed.  Also, reviewed CT again with Dr. Doy Mince, previously with Dr. Azzie Roup.  Felt findings were likely small atherosclerotic ulcer, but not active, and not clearly an anuerysm per se.  Followup at one year considered.  Doubt of significance.   Her updated medication list for this problem includes:    Plavix 75 Mg Tabs (Clopidogrel bisulfate) .Marland Kitchen... 1 tab once daily  Orders: EKG w/ Interpretation  (93000) TLB-BMP (Basic Metabolic Panel-BMET) (99991111) TLB-CBC Platelet - w/Differential (85025-CBCD) TLB-PT (Protime) (85610-PTP) T-2 View CXR (71020TC) Cardiac Catheterization (Cardiac Cath)  Problem # 2:  DYSPNEA (ICD-786.05) multiple components.  Extensive workup.  Her updated medication list for this problem includes:    Losartan Potassium 50 Mg Tabs (Losartan potassium) .Marland Kitchen... Take one tablet by mouth once a day  Problem # 3:  HYPERTENSION (ICD-401.9) controlled on current medications.  Her updated medication list for this problem includes:    Losartan Potassium 50 Mg Tabs (Losartan potassium) .Marland Kitchen... Take one tablet by mouth once a day  Problem # 4:  TOBACCO ABUSE (ICD-305.1) Once in  awhile.  Has bronchitic exam today, so will recheck CXR.   Patient Instructions: 1)  Your physician has requested that you have a cardiac catheterization.  Cardiac catheterization is used to diagnose and/or treat various heart conditions. Doctors may recommend this procedure for a number of different reasons. The most common reason is to evaluate chest pain. Chest pain can be a symptom of coronary artery disease (CAD), and cardiac catheterization can show whether plaque is narrowing or blocking your heart's arteries. This procedure is also used to evaluate the valves, as well as measure the blood flow and oxygen levels in different parts of your heart.  For further information please visit [email or web address].  Please follow instruction sheet, as given.

## 2010-08-29 NOTE — Miscellaneous (Signed)
Summary: [facility] Cardiac Progress Note  [facility] Cardiac Progress Note   Imported By: Sallee Provencal 08/28/2009 11:40:34  _____________________________________________________________________  External Attachment:    Type:   Image     Comment:   External Document

## 2010-08-29 NOTE — Cardiovascular Report (Signed)
Summary: Cath/Percutaneous Orders   Cath/Percutaneous Orders   Imported By: Sallee Provencal 08/15/2010 15:09:24  _____________________________________________________________________  External Attachment:    Type:   Image     Comment:   External Document

## 2010-08-29 NOTE — Medication Information (Signed)
Summary: [facility]  Anticoagulant Therapy  Managed by: Inactive Referring MD: Bing Quarry, MD PCP: Benay Pillow, MD- PMD, Dr Addison Lank - Cardiologist. Dr Erling Cruz - Neurology Supervising MD: [name] MD, Marcello Moores Indication 1: Pulmonary Embolism Lab Used: [place] Site: [place] INR RANGE 2.0-3.0          Comments: Coumadin d/c due to bleed  Allergies: 1)  ! Sulfa  Anticoagulation Management History:      Negative risk factors for bleeding include an age less than 2 years old.  The bleeding index is 'low risk'.  Positive CHADS2 values include History of HTN.  Negative CHADS2 values include Age > 21 years old.  Her last INR was 1.0 ratio.  Anticoagulation responsible provider: [name] MD, Marcello Moores.    Anticoagulation Management Assessment/Plan:      The target INR is 2.0-3.0.  The next INR is due 05/17/2010.  Anticoagulation instructions were given to patient.  Results were reviewed/authorized by Inactive.         Prior Anticoagulation Instructions: INR 3.0  Spoke with pt's son, advised to continue on same dosage 1 tablet daily except 1/2 tablet on Mondays, Wednesdays, and Fridays.  Pt's son will incorporate some vit K into pt's diet today.  Recheck in 1 week. Caren Macadam [place] [id number] [facility] to redraw PT/INR on 05/17/10.

## 2010-08-29 NOTE — Assessment & Plan Note (Signed)
Summary: followup off ACE/lmr   Visit Type:  Follow-up Copy to:  Dr [name] Cardiology Primary Provider/Referring Provider:  Benay Pillow, MD- PMD, Dr Addison Lank - Cardiologist. Dr Erling Cruz - Neurology  CC:  Follow up, SOB with exertion and at rest, and pt states she has been doing better the less active she is.  History of Present Illness:  65 year old ex-30pack smoker. MI in August 2010 which was treated with angioplasty/stent. Subsequewntly developed dyspnea that did not resolve despite another stent in early Dec 2010. Diagnosed to have topiramate induced acidosis iwth low bicarb. Had 80% improvement of dspnea  with improvement in bicarb when she cut topiramate from 3 tabs to 1 tab in early 2011.   OV 12/04/2009: she is seeing me for residual dyspnea that persisted after cutting her topirmate down in early 2011. I am seeing her since early Feb 2011. Last visit in late FEb 2011, I changed ACE inhibitor lisinopril to ARB Cozaar. Subshequently she has seen Dr. [name] on 11/13/2009 and he has discontinued her carvedilol (his note reviewd). Despite this dyspnea persists. Of note, she increased her topiramate to 2 tablets per day in FEb 2011 and is unclear if this has worsened dyspnea. The past 2 weeks she has cut down for unclear reasons her topirmate to 1 tab qhs and this has not helped improve dyspnea.  Her  bicarb on 09/24/2009 while taking 2 tab topiramate was  normal at 24; this suggests that 2 tabs topirmate is not the etiology for dyspnea.  Currently it appears that she is restricting herself in order to prevent dyspnea. She still gets dyspneic climbing a flight of stairs. She is under-playing severity of dyspnea.  Of note,  PFTs on 21/10/2009 and this is essentially normal except for air trapping (RV 137%) and low dlco (66%) c/w mild COPD.  NOTE: she has no clue abotu her medications and has changed her story about what meds she is on and notOV 09/24/2009: Followup dyspnea   Current Medications  (verified): 1)  Amitriptyline Hcl 50 Mg  Tabs (Amitriptyline Hcl) .... One Tablet Every Night At Bedtime 2)  Plavix 75 Mg Tabs (Clopidogrel Bisulfate) .Marland Kitchen.. 1 Tab Once Daily 3)  Crestor 20 Mg Tabs (Rosuvastatin Calcium) .... Take One Tablet By Mouth Daily. 4)  Protonix 40 Mg Solr (Pantoprazole Sodium) .... Take 1 Tqab Daily 12 Hours Away From Plavix 5)  Topamax 25 Mg Tabs (Topiramate) .... Take 2 Tab By Mouth At Bedtime 6)  Cozaar 25 Mg Tabs (Losartan Potassium) .... Take 1 Tablet By Mouth Once A Day  Allergies (verified): 1)  ! Sulfa  Past History:  Family History: Last updated: 08/14/2009 Family History of Colon Cancer:Mother and brother Bladder Cancer: Brother  Social History: Last updated: 09/05/2009 Occupation: Unemployed Patient currently smokes. 1ppd x 30    quit- in October 2010 Alcohol Use - no Daily Caffeine Use -2 Illicit Drug Use - no Divorced  Risk Factors: Smoking Status: current (12/04/2008)  Past Medical History: Reviewed history from 09/05/2009 and no changes required. Current Problems: CAD-S/P MI 8/10 DIVERTICULOSIS HYPERLIPIDEMIA  COLONIC POLYPS, ADENOMATOUS (ICD-211.3) MASTOIDITIS (ICD-383.9) CONSTIPATION (ICD-564.00) GERD (ICD-530.81)/SRICTURE DILATED 10/10 IBS (ICD-564.1) Myocardial Infarction Hypertension  Past Surgical History: Reviewed history from 04/24/2009 and no changes required. T A H and B S O 17 left ear operations S/P CERVICAL NECK SURGERY 8/08 S/P LUMBAR SURGERY 8/09  CAD-S/P BARE METAL STENT LAD 8/10  Family History: Reviewed history from 08/14/2009 and no changes required. Family History of Colon  Cancer:Mother and brother Bladder Cancer: Brother  Social History: Reviewed history from 09/05/2009 and no changes required. Occupation: Unemployed Patient currently smokes. 1ppd x 30    quit- in October 2010 Alcohol Use - no Daily Caffeine Use -2 Illicit Drug Use - no Divorced  Review of Systems       The patient  complains of shortness of breath with activity, shortness of breath at rest, irregular heartbeats, difficulty swallowing, nasal congestion/difficulty breathing through nose, ear ache, and anxiety.  The patient denies productive cough, non-productive cough, coughing up blood, chest pain, acid heartburn, indigestion, loss of appetite, weight change, abdominal pain, tooth/dental problems, headaches, sneezing, itching, depression, hand/feet swelling, joint stiffness or pain, rash, change in color of mucus, and fever.    Vital Signs:  Patient profile:   65 year old female Height:      63 inches Weight:      161 pounds BMI:     28.62 O2 Sat:      97 % on Room air Temp:     98.2 degrees F oral Pulse rate:   101 / minute BP sitting:   116 / 82  (left arm) Cuff size:   regular  Vitals Entered By: [place] Bing CMA (Dec 04, 2009 4:07 PM)  O2 Flow:  Room air CC: Follow up, SOB with exertion and at rest, pt states she has been doing better the less active she is Comments Meds and allergies updated Daytime phone number verified with patient.   Bing CMA  Dec 04, 2009 4:07 PM    Physical Exam  General:  well developed, well nourished, in no acute distress Head:  normocephalic and atraumatic Eyes:  PERRLA/EOM intact; conjunctiva and sclera clear Ears:  TMs intact and clear with normal canals. Hard of hearing in left ear Nose:  no deformity, discharge, inflammation, or lesions Mouth:  no deformity or lesions Neck:  no masses, thyromegaly, or abnormal cervical nodes Chest Wall:  no deformities noted Lungs:  clear bilaterally to auscultation and percussion Heart:  regular rate and rhythm, S1, S2 without murmurs, rubs, gallops, or clicks Abdomen:  bowel sounds positive; abdomen soft and non-tender without masses, or organomegaly Msk:  no deformity or scoliosis noted with normal posture Pulses:  pulses normal Extremities:  no clubbing, cyanosis, edema, or deformity noted Neurologic:   CN II-XII grossly intact with normal reflexes, coordination, muscle strength and tone Skin:  intact without lesions or rashes Cervical Nodes:  no significant adenopathy Axillary Nodes:  no significant adenopathy Psych:  alert and cooperative; normal mood and affect; normal attention span and concentration   Impression & Recommendations:  Problem # 1:  SHORTNESS OF BREATH (ICD-786.05) Assessment Unchanged  She has fluctuating topiramate intake and gives fluctuating hx if dyspnea is less on 1 tab topiramate and more on 2 tabs. Her bicarb on 09/24/2009 while on 2 tabs topiramate was normal at 24. I doubt anymore that topiramate is etiology for dyspnea. PFTs from feb 2011 show some airtrapping and low dlco suggesting some mild copd from pripr smoking but dyspnea is out of proportion to this. Dyspnea has not improved despite changint ACE inhibitor to ARB in feb 2011 and coming off carvedilol in April 2011. I think to sort out why she is dyspneic best to get CPST with ABG pre- and peak exercise and spirometry post CPST for exercise induced asthma.  She also needs a med calendar becuase she is confused about her medicines  Orders: Est. Patient Level III [id number])  Medications Added to Medication List This Visit: 1)  Cozaar 25 Mg Tabs (Losartan potassium) .... Take 1 tablet by mouth once a day  Patient Instructions: 1)  continue all your medicines as advised 2)  bring all your medicines with you that are in your home 3)  meet with my nurse Tammy and she will create a med calendar 4)  this way there is no confusion on what medicines you take 5)  also have CP[facility] Samule [facility] 6)  return to see me after your bike test

## 2010-08-29 NOTE — Miscellaneous (Signed)
Summary: [facility] Orders   [facility] Orders   Imported By: Sallee Provencal 07/17/2010 13:25:57  _____________________________________________________________________  External Attachment:    Type:   Image     Comment:   External Document

## 2010-08-29 NOTE — Procedures (Signed)
Summary: Upper Endoscopy  Patient: [name] Note: All result statuses are Final unless otherwise noted.  Tests: (1) Upper Endoscopy (EGD)   EGD Upper Endoscopy       [facility]     [place], [place]  [address]           ENDOSCOPY PROCEDURE REPORT           PATIENT:  [name], [name]  MR#:  [id number]     BIRTHDATE:  10-09-1945, 65 yrs. old  GENDER:  female           ENDOSCOPIST:  [name], MD, [facility]           PROCEDURE DATE:  05/17/2010     PROCEDURE:  EGD, diagnostic     ASA CLASS:  Class III     INDICATIONS:  hemmoccult positive stool, anemia in setting of     warfarin (stopped), probable recent melena           MEDICATIONS:   Fentanyl 50 mcg IV, Versed 8 mg IV     TOPICAL ANESTHETIC:  Cetacaine Spray           DESCRIPTION OF PROCEDURE:   After the risks benefits and     alternatives of the procedure were thoroughly explained, informed     consent was obtained.  The [id number] endoscope was     introduced through the mouth and advanced to the second portion of     the duodenum, without limitations.  The instrument was slowly     withdrawn as the mucosa was fully examined.     <<PROCEDUREIMAGES>>           The upper, middle, and distal third of the esophagus were     carefully inspected and no abnormalities were noted. The z-line     was well seen at the GEJ. The endoscope was pushed into the fundus     which was normal including a retroflexed view. The antrum,gastric     body, first and second part of the duodenum were unremarkable.     Retroflexed views revealed no abnormalities.    The scope was then     withdrawn from the patient and the procedure completed.           COMPLICATIONS:  None           ENDOSCOPIC IMPRESSION:     1) Normal EGD     RECOMMENDATIONS:     she will need a colonoscopy to look for source of bleeding           REPEAT EXAM:  No           [name], MD, [facility]        CC: Jana Hakim, MD           n.     Lorrin Mais:   [name] at 05/17/2010 01:02 PM           [name], [id number]  Note: An exclamation mark (!) indicates a result that was not dispersed into the flowsheet. Document Creation Date: 05/17/2010 1:03 PM _______________________________________________________________________  (1) Order result status: Final Collection or observation date-time: 05/17/2010 12:52 Requested date-time:  Receipt date-time:  Reported date-time:  Referring Physician:   Ordering Physician: Silvano Rusk [phone]) Specimen Source:  Source: Tawanna Cooler Order Number: [id number] Lab site:

## 2010-08-29 NOTE — Miscellaneous (Signed)
Summary: [facility]  [facility]   Imported By: Sallee Provencal 09/21/2009 14:31:14  _____________________________________________________________________  External Attachment:    Type:   Image     Comment:   External Document

## 2010-08-29 NOTE — Assessment & Plan Note (Signed)
Summary: discuss pft//jrc   Visit Type:  Follow-up Copy to:  Dr [name] Cardiology Primary Provider/Referring Provider:  Benay Pillow, MD- PMD, Dr Addison Lank - Cardiologist. Dr Erling Cruz - Neurology  CC:  Pt here for discuss PFT results. Pt states breathing is worse since the increase in Topamax per Dr. Erling Cruz, chest tightness, and S.O.B with activity .  History of Present Illness: OV 09/24/2009: Followup for DYSPNEA related to TOPIRAMATE induced metabolic acidosis. This is  a 65 year old ex-30pack smoker. Dyspnea onset was after MI in August 2010 which was treated with angioplasty/stent per her hx (Had first stent in August foloowed by another early Dec 2010).   At last visit on 09/05/2009, Ms. Staffa reported a  significant "80%" improvement in  dyspnea  after she had cut her topiramate down from 3 tabs to  1 tab daily. I had noticed that her bicarb had correspondingly improved. Today's visit is to review PFTs to see if there is a treatable pulmonary disorder that could potentially help with remainder of dyspnea. She did have PFTs on 21/10/2009 and this is essentially normal except for air trapping (RV 137%) and low dlco (66%) c/w mild COPD. Our plan was to give empiric inhalers today. However, she now states that in interim headache got worse and she is taking 2 topiramate tabs daily. With this intevention, dyspnea is worse. Quantifies it as dyspnea oon exertion for activities like vaccummuning or 'rushing to do anything'. Dyspnea is relieved by rest. OV 09/24/2009: Followup dyspnea   Current Medications (verified): 1)  Amitriptyline Hcl 50 Mg  Tabs (Amitriptyline Hcl) .... One Tablet Every Night At Bedtime 2)  Dulcolax 5 Mg Tbec (Bisacodyl) .... As Needed 3)  Plavix 75 Mg Tabs (Clopidogrel Bisulfate) .Marland Kitchen.. 1 Tab Once Daily 4)  Lisinopril 2.5 Mg Tabs (Lisinopril) .... Take One Tablet By Mouth Daily 5)  Crestor 40 Mg Tabs (Rosuvastatin Calcium) .... Take One- Half  Tablet By Mouth Daily. 6)  Carvedilol  12.5 Mg Tabs (Carvedilol) .... Take One Tablet By Mouth Twice A Day 7)  Protonix 40 Mg Solr (Pantoprazole Sodium) .... Take 1 Tqab Daily 12 Hours Away From Plavix 8)  Topamax 25 Mg Tabs (Topiramate) .... Take 2 Tab By Mouth At Bedtime  Allergies (verified): 1)  ! Sulfa  Past History:  Family History: Last updated: 08/14/2009 Family History of Colon Cancer:Mother and brother Bladder Cancer: Brother  Social History: Last updated: 09/05/2009 Occupation: Unemployed Patient currently smokes. 1ppd x 30    quit- in October 2010 Alcohol Use - no Daily Caffeine Use -2 Illicit Drug Use - no Divorced  Risk Factors: Smoking Status: current (12/04/2008)  Past Medical History: Reviewed history from 09/05/2009 and no changes required. Current Problems: CAD-S/P MI 8/10 DIVERTICULOSIS HYPERLIPIDEMIA  COLONIC POLYPS, ADENOMATOUS (ICD-211.3) MASTOIDITIS (ICD-383.9) CONSTIPATION (ICD-564.00) GERD (ICD-530.81)/SRICTURE DILATED 10/10 IBS (ICD-564.1) Myocardial Infarction Hypertension  Past Surgical History: Reviewed history from 04/24/2009 and no changes required. T A H and B S O 17 left ear operations S/P CERVICAL NECK SURGERY 8/08 S/P LUMBAR SURGERY 8/09  CAD-S/P BARE METAL STENT LAD 8/10  Family History: Reviewed history from 08/14/2009 and no changes required. Family History of Colon Cancer:Mother and brother Bladder Cancer: Brother  Social History: Reviewed history from 09/05/2009 and no changes required. Occupation: Unemployed Patient currently smokes. 1ppd x 30    quit- in October 2010 Alcohol Use - no Daily Caffeine Use -2 Illicit Drug Use - no Divorced  Review of Systems  The patient complains of shortness of breath with activity and chest pain.  The patient denies shortness of breath at rest, productive cough, non-productive cough, coughing up blood, irregular heartbeats, acid heartburn, indigestion, loss of appetite, weight change, abdominal pain, difficulty  swallowing, sore throat, tooth/dental problems, headaches, nasal congestion/difficulty breathing through nose, sneezing, itching, ear ache, anxiety, depression, hand/feet swelling, joint stiffness or pain, rash, change in color of mucus, and fever.    Vital Signs:  Patient profile:   65 year old female Height:      63 inches Weight:      161.50 pounds O2 Sat:      97 % on Room air Temp:     98.1 degrees F oral Pulse rate:   84 / minute BP sitting:   120 / 86  (left arm) Cuff size:   regular  Vitals Entered By: Iran Planas CMA (September 24, 2009 12:06 PM)  O2 Flow:  Room air CC: Pt here for discuss PFT results. Pt states breathing is worse since the increase in Topamax per Dr. Erling Cruz, chest tightness, S.O.B with activity  Comments Medications reviewed with patient Verified contact number and pharmacy with patient Iran Planas CMA  September 24, 2009 12:07 PM    Physical Exam  General:  well developed, well nourished, in no acute distress Head:  normocephalic and atraumatic Eyes:  PERRLA/EOM intact; conjunctiva and sclera clear Ears:  TMs intact and clear with normal canals. Hard of hearing in left ear Nose:  no deformity, discharge, inflammation, or lesions Mouth:  no deformity or lesions Neck:  no masses, thyromegaly, or abnormal cervical nodes Chest Wall:  no deformities noted Lungs:  clear bilaterally to auscultation and percussion Heart:  regular rate and rhythm, S1, S2 without murmurs, rubs, gallops, or clicks Abdomen:  bowel sounds positive; abdomen soft and non-tender without masses, or organomegaly Msk:  no deformity or scoliosis noted with normal posture Pulses:  pulses normal Extremities:  no clubbing, cyanosis, edema, or deformity noted Neurologic:  CN II-XII grossly intact with normal reflexes, coordination, muscle strength and tone Skin:  intact without lesions or rashes Cervical Nodes:  no significant adenopathy Axillary Nodes:  no significant  adenopathy Psych:  alert and cooperative; normal mood and affect; normal attention span and concentration   Impression & Recommendations:  Problem # 1:  DYSPNEA (ICD-786.05) Assessment Deteriorated Will recheck bmet/bicarb. IF bicarb is low then this suggests recurrence of dyspnea due to Topiramate. In that case,  will discuss with DR Love about alternatives for headache. If bicarb is normal: will swithc ACE inhibitor to Diovan (if okay with Dr. [name]) and then consider empiric COPD Rx.   Orders: TLB-BMP (Basic Metabolic Panel-BMET) ([id number]) Est. Patient Level III [id number])  Medications Added to Medication List This Visit: 1)  Topamax 25 Mg Tabs (Topiramate) .... Take 2 tab by mouth at bedtime  Patient Instructions: 1)  have blood test to see if topamax is indeed making you short of breath 2)  I will call you with blood test result and decide on followup and further steps

## 2010-08-29 NOTE — Cardiovascular Report (Signed)
Summary: Pre Cath Orders   Pre Cath Orders   Imported By: Sallee Provencal 04/02/2010 11:56:10  _____________________________________________________________________  External Attachment:    Type:   Image     Comment:   External Document

## 2010-08-29 NOTE — Progress Notes (Signed)
Summary: Increase Losartan  Phone Note Outgoing Call   Call placed by: [name], RN, BSN,  January 01, 2010 11:32 AM Call placed to: Patient Summary of Call: Dr [name] recommended having the pt increase Losartan to 50mg  once a day for better BP control.  The pt will need a BMP rechecked in 2 weeks.  I spoke with the pt and she is aware of medication dosage change.  The pt will have labs rechecked on 01/15/10.  I instructed the pt to check her BP at home over the next 2 weeks.  Initial call taken by: [name], RN, BSN,  January 01, 2010 11:32 AM    New/Updated Medications: LOSARTAN POTASSIUM 50 MG TABS (LOSARTAN POTASSIUM) take one tablet by mouth once a day Prescriptions: LOSARTAN POTASSIUM 50 MG TABS (LOSARTAN POTASSIUM) take one tablet by mouth once a day  #30 x 6   Entered by:   [name], RN, BSN   Authorized by:   Wadie Lessen, MD, [facility]   Signed by:   [name], RN, BSN on 01/01/2010   Method used:   Electronically to        [facility]. # [id number]* (retail)       [address]       [place], [place]  [address]       Ph: [phone] or [phone]       Fax: [phone]   RxID:   [id number]

## 2010-08-29 NOTE — Letter (Signed)
Summary: [facility] [employer] account executive Pulmonary  [address] [place], [place] [address]   Phone: [phone]  Fax: [phone]     Patient's Name: [name] Date of Birth: Oct 05, 1945 MRN: [id number]  [facility]  Choose test method and choice  a)__x_Bike - recommended by ATS/ACCP. Do at [facility] at Dr. Haroldine Laws Lab  b)___Treadmill - less preferred. Do at [facility] at Dr. Haroldine Laws lab or do at [facility] PFT lab  Choose one or more indication for test  INDICATIONS FOR CARDIOPULMONARY EXERCISE TESTING Evaluation of exercise tolerance ______ Determination of functional impairment or capacity (peak V? O2) ______ Determination of exercise-limiting factors and pathophysiologic mechanisms  Evaluation of undiagnosed exercise intolerance __x___ Assessing contribution of cardiac and pulmonary etiology in coexisting disease __x___ Symptoms disproportionate to resting pulmonary and cardiac tests  __x___Unexplained dyspnea when initial cardiopulmonary testing is nondiagnostic  Evaluation of patients with cardiovascular disease _____ Functional evaluation and prognosis in patients with heart failure _____ Selection for cardiac transplantation _____ Exercise prescription and monitoring response to exercise training for cardiac rehabilitation (special circumstances; i.e., pacemakers)  Evaluation of patients with respiratory disease _____ Functional impairment assessment (see specific clinical applications)  _____Chronic obstructive pulmonary disease Establishing exercise limitation(s) and assessing other potential contributing factors, especially occult heart disease (ischemia) ______Determination of magnitude of hypoxemia and for O2 prescription When objective determination of therapeutic intervention is necessary and not adequately addressed by standard pulmonary function testing  _____ Interstitial lung diseases _____Detection of early (occult) gas exchange abnormalities _____Overall  assessment/monitoring of pulmonary gas exchange _____Determination of magnitude of hypoxemia and for O2 prescription _____Determination of potential exercise-limiting factors _____Documentation of therapeutic response to potentially toxic therapy  ____ Pulmonary vascular disease (careful risk-benefit analysis required)  ____ Cystic fibrosis  __x__ Exercise-induced bronchospasm  Specific clinical applications ____  Preoperative evaluation _____Lung resectional surgery _____Elderly patients undergoing major abdominal surgery _____Lung volume resectional surgery for emphysema (currently investigational)  ____ Exercise evaluation and prescription for pulmonary rehabilitation  ____ Evaluation for impairment-disability  ____ Evaluation for lung, heart-lung transplantation ____ Definition of abbreviation: V? O2______ -oxygen consumption.  FOR MR CHASE:  PLEASE DO ABG BEFORE AND AT PEAK EXERCISE. ATLEAST DO IT AT PEAK EXERCISE. CALL RESP THERAPY FOR THE ABG UNDER MY ORDER  Brand Males MD    [facility]  Appended Document: [facility] Form Appt for [facility] scheduled for Wed. January 02, 2010 at 1:30 at [facility].

## 2010-08-29 NOTE — Assessment & Plan Note (Signed)
Summary: 49 WK F/U   Referring Provider:  n/a Primary Provider:  Benay Pillow, MD  CC:  pt states she has had 1 sharp pain in her chest also fatigue.  History of Present Illness: Her energy level has been done and she feels sluggish.  She has not had real heavy pressure in chest.  Patient is not smoking at all.  Patient has had less nose bleeds;  She went to see Dr. Lucia Gaskins shortly after getting out of hospital.   Symptoms are no were near where she was.  Her shortness of breath is still about the same.  Notably, while in the hospital her BUN was elevated out of proportion to her Cr.  Her stool was negative for blood.  The cause of this was unknown, but did respond to IV fluids.  She says she just does not feel quite right at this time.    Current Medications (verified): 1)  Amitriptyline Hcl 50 Mg  Tabs (Amitriptyline Hcl) .... One Tablet Every Night At Bedtime 2)  Hydrocodone-Acetaminophen 5-500 Mg Tabs (Hydrocodone-Acetaminophen) .... As Needed 3)  Dulcolax 5 Mg Tbec (Bisacodyl) .... As Needed 4)  Plavix 75 Mg Tabs (Clopidogrel Bisulfate) .Marland Kitchen.. 1 Tab Once Daily 5)  Lisinopril 2.5 Mg Tabs (Lisinopril) .... Take One Tablet By Mouth Daily 6)  Crestor 40 Mg Tabs (Rosuvastatin Calcium) .... Take One- Half  Tablet By Mouth Daily. 7)  Carvedilol 12.5 Mg Tabs (Carvedilol) .... Take One Tablet By Mouth Twice A Day 8)  Protonix 40 Mg Solr (Pantoprazole Sodium) .... Take 1 Tqab Daily 12 Hours Away From Plavix 9)  Topamax 25 Mg Tabs (Topiramate) .... 3 Tab At Bedtime  Allergies: 1)  ! Sulfa  Vital Signs:  Patient profile:   65 year old female Height:      63 inches Weight:      155 pounds BMI:     27.56 Pulse rate:   83 / minute Resp:     14 per minute BP sitting:   148 / 92  (left arm)  Vitals Entered By: Burnett Kanaris (August 01, 2009 4:12 PM)  Physical Exam  General:  Well developed, well nourished, in no acute distress. Head:  normocephalic and atraumatic Eyes:  PERRLA/EOM  intact; conjunctiva and lids normal. Lungs:  Clear bilaterally to auscultation and percussion.  Minimal ronchii in RUL.   Heart:  PMI non displaced.  No murmur, rub, or gallop noted.  Abdomen:  Bowel sounds positive; abdomen soft and non-tender without masses, organomegaly, or hernias noted. No hepatosplenomegaly. Extremities:  No clubbing or cyanosis. Neurologic:  Alert and oriented x 3.   EKG  Procedure date:  08/01/2009  Findings:      NSR.  IRBBB.  Left axis.  Delay in R wave consistent with possible old anterior MI.  Echocardiogram  Procedure date:  07/31/2009  Findings:       Study Conclusions     Left ventricle: The cavity size was normal. Wall thickness was     increased in a pattern of mild LVH. Systolic function was normal.     The estimated ejection fraction was in the range of 55% to 60%. Wall     motion was normal; there were no regional wall motion abnormalities.        Transthoracic echocardiography. M-mode, complete 2D, spectral     Doppler, and color Doppler. Height: Height: 160cm. Height: 63in.     Weight: Weight: 72.6kg. Weight: 159.7lb. Body mass index: BMI:  28.3kg/m 2. Body surface area: BSA: 1.46m 2. Blood pressure: 152/88.     Patient status: Outpatient. Location: [facility]  CT Scan  Procedure date:  07/09/2009  Findings:       IMPRESSION:   Negative for PE.  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  Cardiac Cath  Procedure date:  07/09/2009  Findings:      FINDINGS: 1. Hemodynamics; aorta 106/64, LV 128/14. 2. Left ventriculography was not done as the patient just had a left     ventriculogram on December 3. 3. Right coronary artery:  The right coronary artery was dominant     vessel and there was about 40% stenosis proximally and about 40%     stenosis in the midvessel and luminal irregularities throughout,     however, no obstructive disease. 4. Left main:  The left main was free of significant disease. 5.  Left circumflex system.  There were mild luminal irregularities in     the left circumflex.  There was a small first obtuse marginal,     medium-sized second obtuse marginal, and a large PL/OM. 6. LAD system.  There was a long stented area in the proximal LAD.  In     the proximal portion of the stent, there was about 30% in-stent     restenosis at the site of the balloon angioplasty done on December     6.  There was about a 30 to 40% stenosis at the takeoff of the     moderate-sized second diagonal.  Otherwise, there were some luminal     irregularities in the LAD.  Impression & Recommendations:  Problem # 1:  CORONARY ARTERY DISEASE (ICD-414.00) Recent repeat dilitation of the coronary for in stent restenosis.  Had nose bleed, elevated BUN, concern over ability to take DAPT.  Therefore, did not treat with DES.  Was in hospital slightly longer.  Does not feel back to normal.  Has some shortness of breath, although echo shows no change.   Will continue to watch, and check some lab studies on her.  She is to call if there is change. Her updated medication list for this problem includes:    Plavix 75 Mg Tabs (Clopidogrel bisulfate) .Marland Kitchen... 1 tab once daily    Lisinopril 2.5 Mg Tabs (Lisinopril) .Marland Kitchen... Take one tablet by mouth daily    Carvedilol 12.5 Mg Tabs (Carvedilol) .Marland Kitchen... Take one tablet by mouth twice a day  Orders: EKG w/ Interpretation ([address])  Problem # 2:  TOBACCO ABUSE (ICD-305.1) Has stopped completely  Problem # 3:  HYPERLIPIDEMIA (ICD-272.4) Will need to check labs. Her updated medication list for this problem includes:    Crestor 40 Mg Tabs (Rosuvastatin calcium) .Marland Kitchen... Take one- half  tablet by mouth daily.  Orders: EKG w/ Interpretation ([address])  Patient Instructions: 1)  Your physician recommends that you schedule a follow-up appointment in: [place] 2)  Your physician recommends that you return for lab work: CBC, BMP 3)  Your physician recommends that you continue on  your current medications as directed. Please refer to the Current Medication list given to you today.

## 2010-08-29 NOTE — Progress Notes (Signed)
Summary: son calling re cardiac rehab  Phone Note Call from Patient   Caller: Prudencio Burly hinson [phone]- or [phone] Reason for Call: Talk to Nurse Summary of Call: son calling re cardiac rehab Initial call taken by: Lorenda Hatchet,  April 23, 2010 5:00 PM  Follow-up for Phone Call        I spoke with the pt's son and he is concerned because he thinks the pt is getting her medications mixed up and may be over medicated.  He also wanted to find out about cardiac rehab and if the pt could participate in this program.  I made him aware that the pt had an appt tomorrow with Dr [name] and had cancelled this appt.  He said he would have the pt call and reschedule appt.  The pt did see her PCP yesterday and they did not make any changes in medications.  I spoke with the pt's son about cardiac rehab and with the pt's recent ankle fracture she would not be able to participate in most of the activities.  She could participate in arm weights and educational classes.  He will speak with the pt about attending cardiac rehab.     Follow-up by: [name], RN, BSN,  April 24, 2010 2:38 PM

## 2010-08-29 NOTE — Assessment & Plan Note (Signed)
Summary: 6WK F/U SL   Visit Type:  6weeks follow up Referring Provider:  Benay Pillow, MD Primary Provider:  Benay Pillow, MD  CC:  Sob Pt. states is getting better.  History of Present Illness: Shortness of breath has improved substantially.  I spoke with Dr. Justin Mend, and Dr. Erling Cruz.  Topomax likely cause of metabolic acidosis, and shortness of breath may have been a secondary reaction.  Now improved as is CO2.    Current Medications (verified): 1)  Amitriptyline Hcl 50 Mg  Tabs (Amitriptyline Hcl) .... One Tablet Every Night At Bedtime 2)  Hydrocodone-Acetaminophen 5-500 Mg Tabs (Hydrocodone-Acetaminophen) .... As Needed 3)  Dulcolax 5 Mg Tbec (Bisacodyl) .... As Needed 4)  Plavix 75 Mg Tabs (Clopidogrel Bisulfate) .Marland Kitchen.. 1 Tab Once Daily 5)  Lisinopril 2.5 Mg Tabs (Lisinopril) .... Take One Tablet By Mouth Daily 6)  Crestor 40 Mg Tabs (Rosuvastatin Calcium) .... Take One- Half  Tablet By Mouth Daily. 7)  Carvedilol 12.5 Mg Tabs (Carvedilol) .... Take One Tablet By Mouth Twice A Day 8)  Protonix 40 Mg Solr (Pantoprazole Sodium) .... Take 1 Tqab Daily 12 Hours Away From Plavix 9)  Topamax 25 Mg Tabs (Topiramate) .... Take 1 Tab By Mouth At Bedtime  Allergies: 1)  ! Sulfa  Vital Signs:  Patient profile:   65 year old female Height:      63 inches Weight:      159.50 pounds BMI:     28.36 Pulse rate:   80 / minute Pulse rhythm:   regular Resp:     18 per minute BP sitting:   143 / 94  (left arm) Cuff size:   large  Vitals Entered By: Sidney Ace (September 04, 2009 2:36 PM)   Impression & Recommendations:  Problem # 1:  SHORTNESS OF BREATH (ICD-786.05) Scheduled to see pulmonary med tomorrow.  Shortness of breath probably in large part related to Topomax induced metabolic acidosis with respiratory compensation.  Had increase SOB associated with a gradual reduction of CO2 from 23 to 14.  I spoke with Dr. Justin Mend, and then Dr Erling Cruz who prescribed this.  Since reduction in dose, CO2 has  normalized, and SOB significantly improved.  Nonetheless, likely has componenet of COPD and will keep appt. with pulmonary clinic tomorrow.    Problem # 2:  CORONARY ARTERY DISEASE (ICD-414.00) No recurrent symptoms.  Had repeat dilitation, but no definite chest pain.  Continue medical treatment with two month followup. Her updated medication list for this problem includes:    Plavix 75 Mg Tabs (Clopidogrel bisulfate) .Marland Kitchen... 1 tab once daily    Lisinopril 2.5 Mg Tabs (Lisinopril) .Marland Kitchen... Take one tablet by mouth daily    Carvedilol 12.5 Mg Tabs (Carvedilol) .Marland Kitchen... Take one tablet by mouth twice a day  Problem # 3:  HYPERTENSION, BENIGN (ICD-401.1) Options reviewed.  She can increase Lisinopril if persists to 5mg  as before. Her updated medication list for this problem includes:    Lisinopril 2.5 Mg Tabs (Lisinopril) .Marland Kitchen... Take one tablet by mouth daily    Carvedilol 12.5 Mg Tabs (Carvedilol) .Marland Kitchen... Take one tablet by mouth twice a day  Problem # 4:  HYPERLIPIDEMIA (ICD-272.4) Continue current medications. Her updated medication list for this problem includes:    Crestor 40 Mg Tabs (Rosuvastatin calcium) .Marland Kitchen... Take one- half  tablet by mouth daily.  Patient Instructions: 1)  Your physician recommends that you schedule a follow-up appointment in: 2 months with Dr. [name]. 2)  Your physician recommends that  you continue on your current medications as directed. Please refer to the Current Medication list given to you today.

## 2010-08-29 NOTE — Medication Information (Signed)
Summary: [facility]  Anticoagulant Therapy  Managed by: Freddrick March, RN, BSN Referring MD: Bing Quarry, MD PCP: Benay Pillow, MD- PMD, Dr Addison Lank - Cardiologist. Dr Erling Cruz - Neurology Supervising MD: [name] MD, Marcello Moores Indication 1: Pulmonary Embolism Lab Used: [place] Site: [place] INR POC 3.0 INR RANGE 2.0-3.0           Allergies: 1)  ! Sulfa  Anticoagulation Management History:      Her anticoagulation is being managed by telephone today.  Negative risk factors for bleeding include an age less than 65 years old.  The bleeding index is 'low risk'.  Positive CHADS2 values include History of HTN.  Negative CHADS2 values include Age > 68 years old.  Her last INR was 0.9 ratio.  Anticoagulation responsible provider: [name] MD, Marcello Moores.  INR POC: 3.0.    Anticoagulation Management Assessment/Plan:      The target INR is 2.0-3.0.  The next INR is due 05/10/2010.  Anticoagulation instructions were given to patient.  Results were reviewed/authorized by Freddrick March, RN, BSN.         Prior Anticoagulation Instructions: INR 3.6  Spoke to [place], Therapist, sports while in pt's home.  Skip today's dose of Coumadin then decrease dose to 1 tablet every day except 1/2 tablet on Monday, Wednesday and Friday.  Recheck INR in 1 week.   Current Anticoagulation Instructions: INR 3.0  Attempted to call pt's son with results.  LMOM TCB. Freddrick March RN  May 10, 2010 2:33 PM   Appended Document: [facility]    Anticoagulant Therapy  Managed by: Freddrick March, RN, BSN Referring MD: Bing Quarry, MD PCP: Benay Pillow, MD- PMD, Dr Addison Lank - Cardiologist. Dr Erling Cruz - Neurology Supervising MD: [name] MD, Marcello Moores Indication 1: Pulmonary Embolism Lab Used: [place] Site: [place] INR POC 3.0 INR RANGE 2.0-3.0  Dietary changes: no    Health status changes: no    Bleeding/hemorrhagic complications: no     Recent/future hospitalizations: no    Any changes in medication regimen? yes       Details: Laxative prn  Recent/future dental: no  Any missed doses?: no       Is patient compliant with meds? yes       Allergies: 1)  ! Sulfa  Anticoagulation Management History:      The patient is taking warfarin and comes in today for a routine follow up visit.  Negative risk factors for bleeding include an age less than 83 years old.  The bleeding index is 'low risk'.  Positive CHADS2 values include History of HTN.  Negative CHADS2 values include Age > 41 years old.  Her last INR was 0.9 ratio.  Anticoagulation responsible provider: [name] MD, Marcello Moores.  INR POC: 3.0.    Anticoagulation Management Assessment/Plan:      The target INR is 2.0-3.0.  The next INR is due 05/17/2010.  Anticoagulation instructions were given to patient.  Results were reviewed/authorized by Freddrick March, RN, BSN.  She was notified by Freddrick March RN.         Prior Anticoagulation Instructions: INR 3.0  Attempted to call pt's son with results.  LMOM TCB. Freddrick March RN  May 10, 2010 2:33 PM   Current Anticoagulation Instructions: INR 3.0  Spoke with pt's son, advised to continue on same dosage 1 tablet daily except 1/2 tablet on Mondays, Wednesdays, and Fridays.  Pt's son will incorporate some vit K into pt's  diet today.  Recheck in 1 week. Caren Macadam [place] [id number] [facility] to redraw PT/INR on 05/17/10.

## 2010-08-29 NOTE — Letter (Signed)
Summary: Cardiac Catheterization Instructions- [place], [place]  [id number] [place]   [place], [place] [address]   Phone: [phone]  Fax: [phone]     03/13/2010 MRN: [id number]  [facility] [place], [place]  [address]  Dear Ms. Hjort,   You are scheduled for a Cardiac Catheterization on Tuesday March 19, 2010 with Dr. [name].  Please arrive to the 1st floor of the Heart and Vascular Center at [facility] at 7:30 am on the day of your procedure. Please do not arrive before 6:30 a.m. Call the Heart and Vascular Center at [phone] if you are unable to make your appointmnet. The Code to get into the parking garage under the building is 0002. Take the elevators to the 1st floor. You must have someone to drive you home. Someone must be with you for the first 24 hours after you arrive home. Please wear clothes that are easy to get on and off and wear slip-on shoes. Do not eat or drink after midnight except water with your medications that morning. Bring all your medications and current insurance cards with you.   _X__ Make sure you take your PLAVIX.  _X__ You may take ALL of your medications with water that morning.   The usual length of stay after your procedure is 2 to 3 hours. This can vary.  If you have any questions, please call the office at the number listed above.   [name], RN, BSN

## 2010-08-29 NOTE — Miscellaneous (Signed)
Summary: [facility]   [facility] Orders   Imported By: Sallee Provencal 05/07/2010 15:54:37  _____________________________________________________________________  External Attachment:    Type:   Image     Comment:   External Document

## 2010-08-29 NOTE — Progress Notes (Signed)
Summary: Surgical Clearance Appt  Phone Note Outgoing Call   Call placed by: [name], RN, BSN,  March 07, 2010 4:01 PM Call placed to: Patient Summary of Call: I left a message on the pt's home number that Dr [name] needs to see her in the office for surgical clearance.  Dr [name] said he could see the pt on 03/13/10 at 8:45 (this is his day off).  Will schedule appt when pt calls back. Initial call taken by: [name], RN, BSN,  March 07, 2010 4:04 PM  Follow-up for Phone Call        The pt called back and Melissa scheduled appt for pt to see Dr [name]. [name], RN, BSN  March 07, 2010 4:32 PM

## 2010-09-04 ENCOUNTER — Encounter: Payer: Self-pay | Admitting: Cardiology

## 2010-09-04 ENCOUNTER — Other Ambulatory Visit: Payer: Self-pay | Admitting: Cardiology

## 2010-09-04 ENCOUNTER — Other Ambulatory Visit (INDEPENDENT_AMBULATORY_CARE_PROVIDER_SITE_OTHER): Payer: Medicare Other

## 2010-09-04 DIAGNOSIS — I2510 Atherosclerotic heart disease of native coronary artery without angina pectoris: Secondary | ICD-10-CM

## 2010-09-04 DIAGNOSIS — I10 Essential (primary) hypertension: Secondary | ICD-10-CM

## 2010-09-04 LAB — BASIC METABOLIC PANEL
CO2: 24 mEq/L (ref 19–32)
Calcium: 9 mg/dL (ref 8.4–10.5)
Creatinine, Ser: 1.5 mg/dL — ABNORMAL HIGH (ref 0.4–1.2)
GFR: 37.56 mL/min — ABNORMAL LOW (ref >60.00)
Glucose, Bld: 141 mg/dL — ABNORMAL HIGH (ref 70–99)
Sodium: 139 mEq/L (ref 135–145)

## 2010-09-04 NOTE — Progress Notes (Signed)
Summary: Refill  Phone Note Call from Patient Call back at Home Phone [phone]   Caller: Patient Call For: Amy esterwood Reason for Call: Talk to Nurse Summary of Call: Pt needs refill on amitripyline to   [facility] [phone]  Initial call taken by: Martinique Johnson,  August 29, 2010 10:07 AM  Follow-up for Phone Call        ok Follow-up by: Inda Castle MD,  August 29, 2010 2:50 PM    New/Updated Medications: AMITRIPTYLINE HCL 50 MG TABS (AMITRIPTYLINE HCL) Take 1 by mouth q hs Prescriptions: AMITRIPTYLINE HCL 50 MG TABS (AMITRIPTYLINE HCL) Take 1 by mouth q hs  #30 x 0   Entered by:   Rosanne Sack RN   Authorized by:   Inda Castle MD   Signed by:   Rosanne Sack RN on 08/29/2010   Method used:   Electronically to        [facility] (retail)       [id number]       [place], [place]  [address]       Ph: [phone]       Fax: [phone]   RxID:   [id number]

## 2010-09-11 ENCOUNTER — Encounter: Payer: Self-pay | Admitting: Cardiology

## 2010-09-25 ENCOUNTER — Telehealth: Payer: Self-pay | Admitting: Cardiology

## 2010-09-26 ENCOUNTER — Telehealth: Payer: Self-pay | Admitting: Cardiology

## 2010-10-03 NOTE — Progress Notes (Signed)
Summary: rtn call  Phone Note Call from Patient Call back at [phone]   Caller: Patient Reason for Call: Talk to Nurse, Talk to Doctor Summary of Call: pt rtn call to Lauren from yesterday Initial call taken by: Shelda Pal,  September 26, 2010 11:59 AM  Follow-up for Phone Call        Spoke with pt. labs and MD's recommendations given. Pt. verbalized understanding.  Follow-up by: Carollee Sires, RN, BSN,  September 26, 2010 2:48 PM

## 2010-10-03 NOTE — Progress Notes (Signed)
Summary: Calling regarding clearance for surgery  Phone Note From Other Clinic   Caller: [facility] Summary of Call: Calling regarding clearance for pt to have surgery Initial call taken by: Delsa Sale,  September 25, 2010 11:34 AM  Follow-up for Phone Call        I spoke with [name] and made her aware that Dr [name] reviewed note from Dr Amalia Hailey on 09/24/10.  Dr [name] recommended defering any elective surgery for now given pt's recent history.  We could reconsider in 6 months --1 year.  I will fax note back to [phone]. Follow-up by: [name], RN, BSN,  September 25, 2010 3:27 PM

## 2010-10-04 ENCOUNTER — Telehealth: Payer: Self-pay | Admitting: Gastroenterology

## 2010-10-07 LAB — SURGICAL PCR SCREEN: MRSA, PCR: POSITIVE — AB

## 2010-10-08 LAB — CBC
HCT: 28.3 % — ABNORMAL LOW (ref 36.0–46.0)
HCT: 32.5 % — ABNORMAL LOW (ref 36.0–46.0)
Hemoglobin: 10.6 g/dL — ABNORMAL LOW (ref 12.0–15.0)
Hemoglobin: 9.5 g/dL — ABNORMAL LOW (ref 12.0–15.0)
MCH: 30.2 pg (ref 26.0–34.0)
MCH: 31.5 pg (ref 26.0–34.0)
MCHC: 32.6 g/dL (ref 30.0–36.0)
MCV: 92.6 fL (ref 78.0–100.0)
MCV: 93.7 fL (ref 78.0–100.0)
Platelets: 175 10*3/uL (ref 150–400)
Platelets: 201 10*3/uL (ref 150–400)
Platelets: 225 10*3/uL (ref 150–400)
RBC: 3.02 MIL/uL — ABNORMAL LOW (ref 3.87–5.11)
RBC: 3.51 MIL/uL — ABNORMAL LOW (ref 3.87–5.11)
RBC: 3.83 MIL/uL — ABNORMAL LOW (ref 3.87–5.11)
RDW: 13.5 % (ref 11.5–15.5)
RDW: 13.8 % (ref 11.5–15.5)
WBC: 4 10*3/uL (ref 4.0–10.5)
WBC: 4.9 10*3/uL (ref 4.0–10.5)
WBC: 5.3 K/uL (ref 4.0–10.5)

## 2010-10-08 LAB — POCT I-STAT, CHEM 8
BUN: 46 mg/dL — ABNORMAL HIGH (ref 6–23)
BUN: 47 mg/dL — ABNORMAL HIGH (ref 6–23)
Calcium, Ion: 1.1 mmol/L — ABNORMAL LOW (ref 1.12–1.32)
Calcium, Ion: 1.1 mmol/L — ABNORMAL LOW (ref 1.12–1.32)
Chloride: 109 meq/L (ref 96–112)
Chloride: 113 mEq/L — ABNORMAL HIGH (ref 96–112)
Creatinine, Ser: 2.6 mg/dL — ABNORMAL HIGH (ref 0.4–1.2)
Creatinine, Ser: 3 mg/dL — ABNORMAL HIGH (ref 0.4–1.2)
Glucose, Bld: 109 mg/dL — ABNORMAL HIGH (ref 70–99)
Glucose, Bld: 91 mg/dL (ref 70–99)
HCT: 33 % — ABNORMAL LOW (ref 36.0–46.0)
HCT: 36 % (ref 36.0–46.0)
Hemoglobin: 11.2 g/dL — ABNORMAL LOW (ref 12.0–15.0)
Hemoglobin: 12.2 g/dL (ref 12.0–15.0)
Potassium: 4.6 mEq/L (ref 3.5–5.1)
Potassium: 4.9 meq/L (ref 3.5–5.1)
Sodium: 136 mEq/L (ref 135–145)
Sodium: 139 meq/L (ref 135–145)
TCO2: 20 mmol/L (ref 0–100)
TCO2: 20 mmol/L (ref 0–100)

## 2010-10-08 LAB — BASIC METABOLIC PANEL
BUN: 26 mg/dL — ABNORMAL HIGH (ref 6–23)
CO2: 18 mEq/L — ABNORMAL LOW (ref 19–32)
CO2: 19 mEq/L (ref 19–32)
CO2: 19 mEq/L (ref 19–32)
Calcium: 9 mg/dL (ref 8.4–10.5)
Chloride: 109 mEq/L (ref 96–112)
Chloride: 111 mEq/L (ref 96–112)
Chloride: 112 mEq/L (ref 96–112)
Creatinine, Ser: 1.6 mg/dL — ABNORMAL HIGH (ref 0.4–1.2)
Creatinine, Ser: 1.75 mg/dL — ABNORMAL HIGH (ref 0.4–1.2)
Creatinine, Ser: 2 mg/dL — ABNORMAL HIGH (ref 0.4–1.2)
GFR calc Af Amer: 31 mL/min — ABNORMAL LOW (ref >60)
GFR calc Af Amer: 40 mL/min — ABNORMAL LOW (ref >60)
Glucose, Bld: 100 mg/dL — ABNORMAL HIGH (ref 70–99)
Glucose, Bld: 90 mg/dL (ref 70–99)
Potassium: 3.8 mEq/L (ref 3.5–5.1)
Potassium: 4.5 mEq/L (ref 3.5–5.1)
Sodium: 141 mEq/L (ref 135–145)

## 2010-10-08 LAB — DIFFERENTIAL
Basophils Absolute: 0 10*3/uL (ref 0.0–0.1)
Eosinophils Absolute: 0.1 10*3/uL (ref 0.0–0.7)
Lymphocytes Relative: 33 % (ref 12–46)
Lymphs Abs: 1.6 10*3/uL (ref 0.7–4.0)
Neutrophils Relative %: 57 % (ref 43–77)

## 2010-10-08 LAB — URINALYSIS, ROUTINE W REFLEX MICROSCOPIC
Bilirubin Urine: NEGATIVE
Glucose, UA: NEGATIVE mg/dL
Ketones, ur: NEGATIVE mg/dL
Protein, ur: NEGATIVE mg/dL
pH: 5.5 (ref 5.0–8.0)

## 2010-10-08 LAB — RAPID STREP SCREEN (MED CTR MEBANE ONLY): Streptococcus, Group A Screen (Direct): NEGATIVE

## 2010-10-08 LAB — POCT CARDIAC MARKERS
CKMB, poc: 1 ng/mL (ref 1.0–8.0)
Myoglobin, poc: 134 ng/mL (ref 12–200)
Troponin i, poc: <0.05 ng/mL (ref 0.00–0.09)

## 2010-10-08 LAB — CREATININE, URINE, RANDOM: Creatinine, Urine: 131.9 mg/dL

## 2010-10-08 LAB — ALT: ALT: 13 U/L (ref 0–35)

## 2010-10-08 LAB — MRSA PCR SCREENING: MRSA by PCR: POSITIVE — AB

## 2010-10-08 LAB — SODIUM, URINE, RANDOM: Sodium, Ur: 35 mEq/L

## 2010-10-08 LAB — URIC ACID: Uric Acid, Serum: 7.9 mg/dL — ABNORMAL HIGH (ref 2.4–7.0)

## 2010-10-08 LAB — PROTIME-INR: INR: 0.99 (ref 0.00–1.49)

## 2010-10-08 NOTE — Progress Notes (Signed)
Summary: refill  Phone Note Call from Patient Call back at Home Phone [phone]   Caller: Patient Call For: Dr Deatra Ina Reason for Call: Refill Medication Summary of Call: Patient needs refills for her AMITRIPTYLINE sent to [facility] pt states that the [facility] has bees calling since last Friday to get her refills but no one has sent it, pt would like this meds called in today before 7pm. please call pt. Initial call taken by: Ronalee Red,  October 04, 2010 10:17 AM  Follow-up for Phone Call        [facility] at [phone]. Called in rx for Amitriptyline, told the pharmacisit that we have not recieved a refill request. Gave them correct fax. He said they would correct it in the system Follow-up by: Genella Mech CMA Deborra Medina),  October 04, 2010 11:57 AM    Prescriptions: AMITRIPTYLINE HCL 50 MG TABS (AMITRIPTYLINE HCL) Take 1 by mouth q hs  #30 x 1   Entered by:   Genella Mech CMA (AAMA)   Authorized by:   Inda Castle MD   Signed by:   Genella Mech CMA ([place]) on 10/04/2010   Method used:   Electronically to        [facility] # [id number]* (retail)       [address]       [place], [place]  [address]       Ph: [phone] or [phone]       Fax: [phone]   RxID:   [id number]  Cancel this to [facility] pt does not want [facility]

## 2010-10-09 LAB — COMPREHENSIVE METABOLIC PANEL
Alkaline Phosphatase: 65 U/L (ref 39–117)
BUN: 55 mg/dL — ABNORMAL HIGH (ref 6–23)
Creatinine, Ser: 2.93 mg/dL — ABNORMAL HIGH (ref 0.4–1.2)
Glucose, Bld: 109 mg/dL — ABNORMAL HIGH (ref 70–99)
Potassium: 5 mEq/L (ref 3.5–5.1)
Total Protein: 6 g/dL (ref 6.0–8.3)

## 2010-10-09 LAB — CBC
HCT: 11.1 % — ABNORMAL LOW (ref 36.0–46.0)
HCT: 21.4 % — ABNORMAL LOW (ref 36.0–46.0)
HCT: 29.4 % — ABNORMAL LOW (ref 36.0–46.0)
HCT: 33.1 % — ABNORMAL LOW (ref 36.0–46.0)
HCT: 34.8 % — ABNORMAL LOW (ref 36.0–46.0)
Hemoglobin: 10.4 g/dL — ABNORMAL LOW (ref 12.0–15.0)
Hemoglobin: 11 g/dL — ABNORMAL LOW (ref 12.0–15.0)
Hemoglobin: 9.9 g/dL — ABNORMAL LOW (ref 12.0–15.0)
MCH: 30.8 pg (ref 26.0–34.0)
MCH: 31.2 pg (ref 26.0–34.0)
MCH: 31.3 pg (ref 26.0–34.0)
MCHC: 30.6 g/dL (ref 30.0–36.0)
MCHC: 33.2 g/dL (ref 30.0–36.0)
MCHC: 33.3 g/dL (ref 30.0–36.0)
MCHC: 33.4 g/dL (ref 30.0–36.0)
MCV: 92.2 fL (ref 78.0–100.0)
MCV: 93.2 fL (ref 78.0–100.0)
MCV: 93.8 fL (ref 78.0–100.0)
Platelets: 157 10*3/uL (ref 150–400)
Platelets: 157 10*3/uL (ref 150–400)
Platelets: 170 10*3/uL (ref 150–400)
Platelets: 171 10*3/uL (ref 150–400)
Platelets: 173 10*3/uL (ref 150–400)
Platelets: 176 10*3/uL (ref 150–400)
Platelets: 177 10*3/uL (ref 150–400)
Platelets: 193 10*3/uL (ref 150–400)
RBC: 3.17 MIL/uL — ABNORMAL LOW (ref 3.87–5.11)
RBC: 3.42 MIL/uL — ABNORMAL LOW (ref 3.87–5.11)
RBC: 3.53 MIL/uL — ABNORMAL LOW (ref 3.87–5.11)
RBC: 3.54 MIL/uL — ABNORMAL LOW (ref 3.87–5.11)
RBC: 3.64 MIL/uL — ABNORMAL LOW (ref 3.87–5.11)
RBC: 3.7 MIL/uL — ABNORMAL LOW (ref 3.87–5.11)
RDW: 14.7 % (ref 11.5–15.5)
RDW: 15.3 % (ref 11.5–15.5)
RDW: 15.9 % — ABNORMAL HIGH (ref 11.5–15.5)
RDW: 16.5 % — ABNORMAL HIGH (ref 11.5–15.5)
RDW: 17.3 % — ABNORMAL HIGH (ref 11.5–15.5)
RDW: 18 % — ABNORMAL HIGH (ref 11.5–15.5)
WBC: 4.8 10*3/uL (ref 4.0–10.5)
WBC: 5 10*3/uL (ref 4.0–10.5)
WBC: 5.9 10*3/uL (ref 4.0–10.5)
WBC: 6.2 10*3/uL (ref 4.0–10.5)
WBC: 7.5 10*3/uL (ref 4.0–10.5)
WBC: 9.6 10*3/uL (ref 4.0–10.5)

## 2010-10-09 LAB — BLOOD GAS, ARTERIAL
Bicarbonate: 17.1 mEq/L — ABNORMAL LOW (ref 20.0–24.0)
O2 Content: 2 L/min
Patient temperature: 98.6
TCO2: 18.1 mmol/L (ref 0–100)
pH, Arterial: 7.35 (ref 7.350–7.400)

## 2010-10-09 LAB — BASIC METABOLIC PANEL
BUN: 21 mg/dL (ref 6–23)
BUN: 33 mg/dL — ABNORMAL HIGH (ref 6–23)
BUN: 36 mg/dL — ABNORMAL HIGH (ref 6–23)
CO2: 18 mEq/L — ABNORMAL LOW (ref 19–32)
CO2: 20 mEq/L (ref 19–32)
Calcium: 8.7 mg/dL (ref 8.4–10.5)
Calcium: 8.8 mg/dL (ref 8.4–10.5)
Calcium: 9 mg/dL (ref 8.4–10.5)
Calcium: 9 mg/dL (ref 8.4–10.5)
Calcium: 9.1 mg/dL (ref 8.4–10.5)
Chloride: 111 mEq/L (ref 96–112)
Chloride: 114 mEq/L — ABNORMAL HIGH (ref 96–112)
Chloride: 115 mEq/L — ABNORMAL HIGH (ref 96–112)
Creatinine, Ser: 1.7 mg/dL — ABNORMAL HIGH (ref 0.4–1.2)
Creatinine, Ser: 1.74 mg/dL — ABNORMAL HIGH (ref 0.4–1.2)
Creatinine, Ser: 1.78 mg/dL — ABNORMAL HIGH (ref 0.4–1.2)
Creatinine, Ser: 1.86 mg/dL — ABNORMAL HIGH (ref 0.4–1.2)
Creatinine, Ser: 2.14 mg/dL — ABNORMAL HIGH (ref 0.4–1.2)
Creatinine, Ser: 2.35 mg/dL — ABNORMAL HIGH (ref 0.4–1.2)
GFR calc Af Amer: 25 mL/min — ABNORMAL LOW (ref >60)
GFR calc Af Amer: 25 mL/min — ABNORMAL LOW (ref >60)
GFR calc Af Amer: 33 mL/min — ABNORMAL LOW (ref >60)
GFR calc Af Amer: 35 mL/min — ABNORMAL LOW (ref >60)
GFR calc Af Amer: 36 mL/min — ABNORMAL LOW (ref >60)
GFR calc Af Amer: 37 mL/min — ABNORMAL LOW (ref >60)
GFR calc Af Amer: 43 mL/min — ABNORMAL LOW (ref >60)
GFR calc non Af Amer: 21 mL/min — ABNORMAL LOW (ref >60)
GFR calc non Af Amer: 23 mL/min — ABNORMAL LOW (ref >60)
GFR calc non Af Amer: 27 mL/min — ABNORMAL LOW (ref >60)
GFR calc non Af Amer: 30 mL/min — ABNORMAL LOW (ref >60)
GFR calc non Af Amer: 36 mL/min — ABNORMAL LOW (ref >60)
Glucose, Bld: 104 mg/dL — ABNORMAL HIGH (ref 70–99)
Glucose, Bld: 82 mg/dL (ref 70–99)
Potassium: 3.6 mEq/L (ref 3.5–5.1)
Potassium: 3.7 mEq/L (ref 3.5–5.1)
Potassium: 4 mEq/L (ref 3.5–5.1)
Potassium: 4.2 mEq/L (ref 3.5–5.1)
Sodium: 136 mEq/L (ref 135–145)
Sodium: 137 mEq/L (ref 135–145)
Sodium: 138 mEq/L (ref 135–145)
Sodium: 138 mEq/L (ref 135–145)
Sodium: 138 mEq/L (ref 135–145)

## 2010-10-09 LAB — CROSSMATCH
Antibody Screen: NEGATIVE
Unit division: 0
Unit division: 0
Unit division: 0

## 2010-10-09 LAB — BRAIN NATRIURETIC PEPTIDE
Pro B Natriuretic peptide (BNP): 1084 pg/mL — ABNORMAL HIGH (ref 0.0–100.0)
Pro B Natriuretic peptide (BNP): 944 pg/mL — ABNORMAL HIGH (ref 0.0–100.0)
Pro B Natriuretic peptide (BNP): 980 pg/mL — ABNORMAL HIGH (ref 0.0–100.0)

## 2010-10-09 LAB — DIFFERENTIAL
Basophils Absolute: 0 10*3/uL (ref 0.0–0.1)
Basophils Relative: 0 % (ref 0–1)
Lymphocytes Relative: 40 % (ref 12–46)
Lymphs Abs: 2.5 10*3/uL (ref 0.7–4.0)
Monocytes Absolute: 0.3 10*3/uL (ref 0.1–1.0)
Monocytes Relative: 5 % (ref 3–12)
Monocytes Relative: 5 % (ref 3–12)
Neutro Abs: 3.3 10*3/uL (ref 1.7–7.7)
Neutro Abs: 3.6 10*3/uL (ref 1.7–7.7)
Neutrophils Relative %: 53 % (ref 43–77)
Neutrophils Relative %: 64 % (ref 43–77)

## 2010-10-09 LAB — HEMOCCULT GUIAC POC 1CARD (OFFICE): Fecal Occult Bld: POSITIVE

## 2010-10-09 LAB — URINALYSIS, ROUTINE W REFLEX MICROSCOPIC
Bilirubin Urine: NEGATIVE
Glucose, UA: NEGATIVE mg/dL
Ketones, ur: NEGATIVE mg/dL
Protein, ur: NEGATIVE mg/dL
pH: 6 (ref 5.0–8.0)

## 2010-10-09 LAB — PROTIME-INR
INR: 1.6 — ABNORMAL HIGH (ref 0.00–1.49)
INR: 2.09 — ABNORMAL HIGH (ref 0.00–1.49)
INR: 2.28 — ABNORMAL HIGH (ref 0.00–1.49)
Prothrombin Time: 16.8 seconds — ABNORMAL HIGH (ref 11.6–15.2)
Prothrombin Time: 19.2 seconds — ABNORMAL HIGH (ref 11.6–15.2)

## 2010-10-09 LAB — HEPATIC FUNCTION PANEL
ALT: 11 U/L (ref 0–35)
AST: 17 U/L (ref 0–37)
Albumin: 2.8 g/dL — ABNORMAL LOW (ref 3.5–5.2)
Bilirubin, Direct: 0.1 mg/dL (ref 0.0–0.3)
Total Bilirubin: 1 mg/dL (ref 0.3–1.2)

## 2010-10-09 LAB — TROPONIN I
Troponin I: 0.08 ng/mL — ABNORMAL HIGH (ref 0.00–0.06)
Troponin I: 0.1 ng/mL — ABNORMAL HIGH (ref 0.00–0.06)

## 2010-10-09 LAB — CK TOTAL AND CKMB (NOT AT ARMC)
Relative Index: INVALID (ref 0.0–2.5)
Total CK: 84 U/L (ref 7–177)

## 2010-10-09 LAB — CORTISOL: Cortisol, Plasma: 4.6 ug/dL

## 2010-10-09 LAB — APTT: aPTT: 28 seconds (ref 24–37)

## 2010-10-09 LAB — MRSA PCR SCREENING: MRSA by PCR: POSITIVE — AB

## 2010-10-10 LAB — BASIC METABOLIC PANEL
BUN: 11 mg/dL (ref 6–23)
BUN: 12 mg/dL (ref 6–23)
BUN: 13 mg/dL (ref 6–23)
BUN: 16 mg/dL (ref 6–23)
CO2: 21 mEq/L (ref 19–32)
CO2: 27 mEq/L (ref 19–32)
CO2: 20 mEq/L (ref 19–32)
CO2: 23 mEq/L (ref 19–32)
Calcium: 9.1 mg/dL (ref 8.4–10.5)
Calcium: 8.1 mg/dL — ABNORMAL LOW (ref 8.4–10.5)
Chloride: 108 mEq/L (ref 96–112)
Chloride: 111 mEq/L (ref 96–112)
Chloride: 113 mEq/L — ABNORMAL HIGH (ref 96–112)
Creatinine, Ser: 1.09 mg/dL (ref 0.4–1.2)
Creatinine, Ser: 1.17 mg/dL (ref 0.4–1.2)
Creatinine, Ser: 1.19 mg/dL (ref 0.4–1.2)
GFR calc Af Amer: 56 mL/min — ABNORMAL LOW (ref >60)
GFR calc Af Amer: 60 mL/min — ABNORMAL LOW (ref >60)
GFR calc non Af Amer: 57 mL/min — ABNORMAL LOW (ref >60)
Glucose, Bld: 94 mg/dL (ref 70–99)
Glucose, Bld: 97 mg/dL (ref 70–99)
Glucose, Bld: 119 mg/dL — ABNORMAL HIGH (ref 70–99)
Potassium: 3.4 mEq/L — ABNORMAL LOW (ref 3.5–5.1)
Potassium: 3.8 mEq/L (ref 3.5–5.1)
Sodium: 140 mEq/L (ref 135–145)

## 2010-10-10 LAB — CBC
HCT: 27.4 % — ABNORMAL LOW (ref 36.0–46.0)
HCT: 32.3 % — ABNORMAL LOW (ref 36.0–46.0)
HCT: 26 % — ABNORMAL LOW (ref 36.0–46.0)
HCT: 26.4 % — ABNORMAL LOW (ref 36.0–46.0)
HCT: 26.8 % — ABNORMAL LOW (ref 36.0–46.0)
HCT: 28.2 % — ABNORMAL LOW (ref 36.0–46.0)
HCT: 28.5 % — ABNORMAL LOW (ref 36.0–46.0)
HCT: 29.4 % — ABNORMAL LOW (ref 36.0–46.0)
HCT: 31 % — ABNORMAL LOW (ref 36.0–46.0)
HCT: 32.7 % — ABNORMAL LOW (ref 36.0–46.0)
Hemoglobin: 10.3 g/dL — ABNORMAL LOW (ref 12.0–15.0)
Hemoglobin: 8.6 g/dL — ABNORMAL LOW (ref 12.0–15.0)
Hemoglobin: 8.7 g/dL — ABNORMAL LOW (ref 12.0–15.0)
Hemoglobin: 9 g/dL — ABNORMAL LOW (ref 12.0–15.0)
Hemoglobin: 9.2 g/dL — ABNORMAL LOW (ref 12.0–15.0)
Hemoglobin: 9.8 g/dL — ABNORMAL LOW (ref 12.0–15.0)
Hemoglobin: 9.9 g/dL — ABNORMAL LOW (ref 12.0–15.0)
MCH: 31.6 pg (ref 26.0–34.0)
MCH: 31.9 pg (ref 26.0–34.0)
MCH: 32 pg (ref 26.0–34.0)
MCH: 32.1 pg (ref 26.0–34.0)
MCH: 31 pg (ref 26.0–34.0)
MCH: 31.3 pg (ref 26.0–34.0)
MCH: 31.7 pg (ref 26.0–34.0)
MCH: 31.9 pg (ref 26.0–34.0)
MCH: 32 pg (ref 26.0–34.0)
MCH: 32.1 pg (ref 26.0–34.0)
MCHC: 33.9 g/dL (ref 30.0–36.0)
MCHC: 33.9 g/dL (ref 30.0–36.0)
MCHC: 34.4 g/dL (ref 30.0–36.0)
MCHC: 32.6 g/dL (ref 30.0–36.0)
MCHC: 33.2 g/dL (ref 30.0–36.0)
MCHC: 33.3 g/dL (ref 30.0–36.0)
MCHC: 34.1 g/dL (ref 30.0–36.0)
MCV: 93.7 fL (ref 78.0–100.0)
MCV: 93.9 fL (ref 78.0–100.0)
MCV: 93.9 fL (ref 78.0–100.0)
MCV: 94.2 fL (ref 78.0–100.0)
MCV: 94.7 fL (ref 78.0–100.0)
MCV: 94.8 fL (ref 78.0–100.0)
MCV: 95.3 fL (ref 78.0–100.0)
MCV: 95.4 fL (ref 78.0–100.0)
MCV: 96 fL (ref 78.0–100.0)
MCV: 96 fL (ref 78.0–100.0)
MCV: 96.2 fL (ref 78.0–100.0)
MCV: 96.3 fL (ref 78.0–100.0)
Platelets: 182 10*3/uL (ref 150–400)
Platelets: 235 10*3/uL (ref 150–400)
Platelets: 246 K/uL (ref 150–400)
Platelets: 254 10*3/uL (ref 150–400)
Platelets: 292 10*3/uL (ref 150–400)
Platelets: 318 10*3/uL (ref 150–400)
Platelets: 321 10*3/uL (ref 150–400)
Platelets: 337 10*3/uL (ref 150–400)
RBC: 2.7 MIL/uL — ABNORMAL LOW (ref 3.87–5.11)
RBC: 2.77 MIL/uL — ABNORMAL LOW (ref 3.87–5.11)
RBC: 2.77 MIL/uL — ABNORMAL LOW (ref 3.87–5.11)
RBC: 2.81 MIL/uL — ABNORMAL LOW (ref 3.87–5.11)
RBC: 2.92 MIL/uL — ABNORMAL LOW (ref 3.87–5.11)
RBC: 3.08 MIL/uL — ABNORMAL LOW (ref 3.87–5.11)
RBC: 3.09 MIL/uL — ABNORMAL LOW (ref 3.87–5.11)
RBC: 3.19 MIL/uL — ABNORMAL LOW (ref 3.87–5.11)
RBC: 3.23 MIL/uL — ABNORMAL LOW (ref 3.87–5.11)
RBC: 3.59 MIL/uL — ABNORMAL LOW (ref 3.87–5.11)
RDW: 13.4 % (ref 11.5–15.5)
RDW: 14.2 % (ref 11.5–15.5)
RDW: 14.2 % (ref 11.5–15.5)
RDW: 14.4 % (ref 11.5–15.5)
RDW: 14.4 % (ref 11.5–15.5)
RDW: 14.4 % (ref 11.5–15.5)
RDW: 14.5 % (ref 11.5–15.5)
RDW: 14.5 % (ref 11.5–15.5)
RDW: 14.5 % (ref 11.5–15.5)
RDW: 14.6 % (ref 11.5–15.5)
RDW: 14.7 % (ref 11.5–15.5)
RDW: 14.8 % (ref 11.5–15.5)
WBC: 4.7 10*3/uL (ref 4.0–10.5)
WBC: 5.1 10*3/uL (ref 4.0–10.5)
WBC: 6.3 10*3/uL (ref 4.0–10.5)
WBC: 6.6 10*3/uL (ref 4.0–10.5)
WBC: 7.3 10*3/uL (ref 4.0–10.5)
WBC: 7.4 10*3/uL (ref 4.0–10.5)
WBC: 8.1 K/uL (ref 4.0–10.5)
WBC: 9.3 10*3/uL (ref 4.0–10.5)
WBC: 9.9 10*3/uL (ref 4.0–10.5)

## 2010-10-10 LAB — PROTIME-INR
INR: 0.91 (ref 0.00–1.49)
INR: 0.94 (ref 0.00–1.49)
INR: 2.06 — ABNORMAL HIGH (ref 0.00–1.49)
Prothrombin Time: 30 seconds — ABNORMAL HIGH (ref 11.6–15.2)
Prothrombin Time: 34.9 seconds — ABNORMAL HIGH (ref 11.6–15.2)
Prothrombin Time: 35.8 seconds — ABNORMAL HIGH (ref 11.6–15.2)
Prothrombin Time: 12.5 s (ref 11.6–15.2)

## 2010-10-10 LAB — COMPREHENSIVE METABOLIC PANEL
ALT: 29 U/L (ref 0–35)
ALT: 47 U/L — ABNORMAL HIGH (ref 0–35)
AST: 272 U/L — ABNORMAL HIGH (ref 0–37)
Albumin: 2.2 g/dL — ABNORMAL LOW (ref 3.5–5.2)
Alkaline Phosphatase: 73 U/L (ref 39–117)
Alkaline Phosphatase: 84 U/L (ref 39–117)
Alkaline Phosphatase: 85 U/L (ref 39–117)
BUN: 12 mg/dL (ref 6–23)
BUN: 12 mg/dL (ref 6–23)
CO2: 20 mEq/L (ref 19–32)
Calcium: 8.4 mg/dL (ref 8.4–10.5)
Chloride: 113 mEq/L — ABNORMAL HIGH (ref 96–112)
Chloride: 114 mEq/L — ABNORMAL HIGH (ref 96–112)
GFR calc Af Amer: >60 mL/min (ref >60)
GFR calc non Af Amer: >60 mL/min (ref >60)
Glucose, Bld: 78 mg/dL (ref 70–99)
Glucose, Bld: 98 mg/dL (ref 70–99)
Potassium: 3.5 mEq/L (ref 3.5–5.1)
Potassium: 3.7 mEq/L (ref 3.5–5.1)
Potassium: 3.8 mEq/L (ref 3.5–5.1)
Sodium: 138 mEq/L (ref 135–145)
Sodium: 138 mEq/L (ref 135–145)
Total Bilirubin: 0.4 mg/dL (ref 0.3–1.2)
Total Bilirubin: 0.5 mg/dL (ref 0.3–1.2)

## 2010-10-10 LAB — CARDIAC PANEL(CRET KIN+CKTOT+MB+TROPI)
CK, MB: 4.4 ng/mL — ABNORMAL HIGH (ref 0.3–4.0)
CK, MB: 5.3 ng/mL — ABNORMAL HIGH (ref 0.3–4.0)
CK, MB: 6.2 ng/mL (ref 0.3–4.0)
CK, MB: 9.4 ng/mL (ref 0.3–4.0)
Relative Index: 1.8 (ref 0.0–2.5)
Relative Index: 1.7 (ref 0.0–2.5)
Relative Index: 1.8 (ref 0.0–2.5)
Relative Index: 2 (ref 0.0–2.5)
Total CK: 238 U/L — ABNORMAL HIGH (ref 7–177)
Total CK: 273 U/L — ABNORMAL HIGH (ref 7–177)
Total CK: 511 U/L — ABNORMAL HIGH (ref 7–177)
Troponin I: 14.7 ng/mL (ref 0.00–0.06)
Troponin I: 17.31 ng/mL (ref 0.00–0.06)
Troponin I: 19 ng/mL (ref 0.00–0.06)
Troponin I: 19.92 ng/mL (ref 0.00–0.06)
Troponin I: 20.51 ng/mL (ref 0.00–0.06)

## 2010-10-10 LAB — TROPONIN I: Troponin I: 0.17 ng/mL — ABNORMAL HIGH (ref 0.00–0.06)

## 2010-10-10 LAB — HEPARIN LEVEL (UNFRACTIONATED)
Heparin Unfractionated: 0.3 IU/mL (ref 0.30–0.70)
Heparin Unfractionated: 0.32 IU/mL (ref 0.30–0.70)
Heparin Unfractionated: 0.36 IU/mL (ref 0.30–0.70)

## 2010-10-10 LAB — DIFFERENTIAL
Basophils Absolute: 0 10*3/uL (ref 0.0–0.1)
Basophils Absolute: 0 10*3/uL (ref 0.0–0.1)
Basophils Absolute: 0 10*3/uL (ref 0.0–0.1)
Basophils Absolute: 0 10*3/uL (ref 0.0–0.1)
Basophils Absolute: 0 10*3/uL (ref 0.0–0.1)
Basophils Absolute: 0 10*3/uL (ref 0.0–0.1)
Basophils Absolute: 0 10*3/uL (ref 0.0–0.1)
Basophils Absolute: 0 10*3/uL (ref 0.0–0.1)
Basophils Relative: 0 % (ref 0–1)
Basophils Relative: 0 % (ref 0–1)
Basophils Relative: 0 % (ref 0–1)
Basophils Relative: 0 % (ref 0–1)
Basophils Relative: 0 % (ref 0–1)
Eosinophils Absolute: 0.1 10*3/uL (ref 0.0–0.7)
Eosinophils Absolute: 0.1 10*3/uL (ref 0.0–0.7)
Eosinophils Absolute: 0.1 10*3/uL (ref 0.0–0.7)
Eosinophils Absolute: 0.2 10*3/uL (ref 0.0–0.7)
Eosinophils Absolute: 0.2 10*3/uL (ref 0.0–0.7)
Eosinophils Absolute: 0.2 10*3/uL (ref 0.0–0.7)
Eosinophils Absolute: 0.2 10*3/uL (ref 0.0–0.7)
Eosinophils Absolute: 0.2 10*3/uL (ref 0.0–0.7)
Eosinophils Relative: 1 % (ref 0–5)
Eosinophils Relative: 2 % (ref 0–5)
Eosinophils Relative: 2 % (ref 0–5)
Eosinophils Relative: 3 % (ref 0–5)
Eosinophils Relative: 3 % (ref 0–5)
Eosinophils Relative: 4 % (ref 0–5)
Eosinophils Relative: 4 % (ref 0–5)
Lymphocytes Relative: 15 % (ref 12–46)
Lymphocytes Relative: 22 % (ref 12–46)
Lymphocytes Relative: 30 % (ref 12–46)
Lymphocytes Relative: 32 % (ref 12–46)
Lymphocytes Relative: 39 % (ref 12–46)
Lymphs Abs: 1.1 10*3/uL (ref 0.7–4.0)
Lymphs Abs: 1.2 K/uL (ref 0.7–4.0)
Lymphs Abs: 1.3 10*3/uL (ref 0.7–4.0)
Lymphs Abs: 2 10*3/uL (ref 0.7–4.0)
Lymphs Abs: 2 10*3/uL (ref 0.7–4.0)
Lymphs Abs: 2.6 10*3/uL (ref 0.7–4.0)
Lymphs Abs: 3.2 10*3/uL (ref 0.7–4.0)
Monocytes Absolute: 0.5 10*3/uL (ref 0.1–1.0)
Monocytes Absolute: 0.5 10*3/uL (ref 0.1–1.0)
Monocytes Absolute: 0.6 10*3/uL (ref 0.1–1.0)
Monocytes Absolute: 0.2 10*3/uL (ref 0.1–1.0)
Monocytes Absolute: 0.2 10*3/uL (ref 0.1–1.0)
Monocytes Absolute: 0.4 10*3/uL (ref 0.1–1.0)
Monocytes Relative: 3 % (ref 3–12)
Monocytes Relative: 6 % (ref 3–12)
Neutro Abs: 2.1 10*3/uL (ref 1.7–7.7)
Neutro Abs: 2.3 10*3/uL (ref 1.7–7.7)
Neutro Abs: 6.5 10*3/uL (ref 1.7–7.7)
Neutrophils Relative %: 45 % (ref 43–77)
Neutrophils Relative %: 54 % (ref 43–77)
Neutrophils Relative %: 57 % (ref 43–77)
Neutrophils Relative %: 58 % (ref 43–77)
Neutrophils Relative %: 81 % — ABNORMAL HIGH (ref 43–77)

## 2010-10-10 LAB — POCT I-STAT 3, ART BLOOD GAS (G3+)
Acid-base deficit: 7 mmol/L — ABNORMAL HIGH (ref 0.0–2.0)
O2 Saturation: 93 %
TCO2: 20 mmol/L (ref 0–100)
pCO2 arterial: 38 mmHg (ref 35.0–45.0)

## 2010-10-10 LAB — BASIC METABOLIC PANEL WITH GFR
Calcium: 9.1 mg/dL (ref 8.4–10.5)
Chloride: 113 meq/L — ABNORMAL HIGH (ref 96–112)
Creatinine, Ser: 0.99 mg/dL (ref 0.4–1.2)
GFR calc Af Amer: >60 mL/min (ref >60)
Sodium: 141 meq/L (ref 135–145)

## 2010-10-10 LAB — HEPATITIS PANEL, ACUTE
Hep B C IgM: NEGATIVE
Hepatitis B Surface Ag: NEGATIVE

## 2010-10-10 LAB — FOLATE: Folate: >20 ng/mL

## 2010-10-10 LAB — POCT CARDIAC MARKERS
CKMB, poc: 3.2 ng/mL (ref 1.0–8.0)
Myoglobin, poc: >500 ng/mL (ref 12–200)
Troponin i, poc: 0.08 ng/mL (ref 0.00–0.09)

## 2010-10-10 LAB — GLUCOSE, CAPILLARY
Glucose-Capillary: 100 mg/dL — ABNORMAL HIGH (ref 70–99)
Glucose-Capillary: 101 mg/dL — ABNORMAL HIGH (ref 70–99)
Glucose-Capillary: 112 mg/dL — ABNORMAL HIGH (ref 70–99)

## 2010-10-10 LAB — IRON AND TIBC: Saturation Ratios: 18 % — ABNORMAL LOW (ref 20–55)

## 2010-10-10 LAB — FERRITIN: Ferritin: 125 ng/mL (ref 10–291)

## 2010-10-10 LAB — BRAIN NATRIURETIC PEPTIDE
Pro B Natriuretic peptide (BNP): 1236 pg/mL — ABNORMAL HIGH (ref 0.0–100.0)
Pro B Natriuretic peptide (BNP): 1328 pg/mL — ABNORMAL HIGH (ref 0.0–100.0)

## 2010-10-10 LAB — TYPE AND SCREEN
ABO/RH(D): A POS
Antibody Screen: NEGATIVE

## 2010-10-10 LAB — CK TOTAL AND CKMB (NOT AT ARMC)
CK, MB: 5.7 ng/mL — ABNORMAL HIGH (ref 0.3–4.0)
Relative Index: 2.2 (ref 0.0–2.5)
Total CK: 263 U/L — ABNORMAL HIGH (ref 7–177)

## 2010-10-10 LAB — URINALYSIS, MICROSCOPIC ONLY
Bilirubin Urine: NEGATIVE
Glucose, UA: NEGATIVE mg/dL
Specific Gravity, Urine: 1.042 — ABNORMAL HIGH (ref 1.005–1.030)
pH: 5.5 (ref 5.0–8.0)

## 2010-10-10 LAB — RETICULOCYTES: Retic Count, Absolute: 60.1 10*3/uL (ref 19.0–186.0)

## 2010-10-10 LAB — APTT: aPTT: 29 s (ref 24–37)

## 2010-10-13 LAB — DIFFERENTIAL
Basophils Absolute: 0 10*3/uL (ref 0.0–0.1)
Lymphocytes Relative: 45 % (ref 12–46)
Lymphs Abs: 3 10*3/uL (ref 0.7–4.0)
Monocytes Absolute: 0.4 10*3/uL (ref 0.1–1.0)
Monocytes Relative: 6 % (ref 3–12)
Neutro Abs: 2.9 10*3/uL (ref 1.7–7.7)

## 2010-10-13 LAB — CBC
HCT: 35.1 % — ABNORMAL LOW (ref 36.0–46.0)
MCV: 95.7 fL (ref 78.0–100.0)
Platelets: 225 10*3/uL (ref 150–400)
RDW: 14.3 % (ref 11.5–15.5)

## 2010-10-13 LAB — POCT CARDIAC MARKERS
CKMB, poc: <1 ng/mL — ABNORMAL LOW (ref 1.0–8.0)
Myoglobin, poc: 46.9 ng/mL (ref 12–200)
Myoglobin, poc: 58.1 ng/mL (ref 12–200)
Troponin i, poc: <0.05 ng/mL (ref 0.00–0.09)

## 2010-10-13 LAB — COMPREHENSIVE METABOLIC PANEL
AST: 22 U/L (ref 0–37)
Albumin: 4 g/dL (ref 3.5–5.2)
BUN: 23 mg/dL (ref 6–23)
Calcium: 9.1 mg/dL (ref 8.4–10.5)
Creatinine, Ser: 1.27 mg/dL — ABNORMAL HIGH (ref 0.4–1.2)
GFR calc Af Amer: 52 mL/min — ABNORMAL LOW (ref >60)
Total Protein: 7.1 g/dL (ref 6.0–8.3)

## 2010-10-16 LAB — BLOOD GAS, ARTERIAL
Acid-base deficit: 4.6 mmol/L — ABNORMAL HIGH (ref 0.0–2.0)
Bicarbonate: 19.5 mEq/L — ABNORMAL LOW (ref 20.0–24.0)
Patient temperature: 98.6
TCO2: 20.5 mmol/L (ref 0–100)
pH, Arterial: 7.389 (ref 7.350–7.400)

## 2010-10-20 LAB — POCT I-STAT, CHEM 8
BUN: 22 mg/dL (ref 6–23)
Chloride: 115 mEq/L — ABNORMAL HIGH (ref 96–112)
HCT: 37 % (ref 36.0–46.0)
Sodium: 140 mEq/L (ref 135–145)
TCO2: 18 mmol/L (ref 0–100)

## 2010-10-20 LAB — URINALYSIS, ROUTINE W REFLEX MICROSCOPIC
Bilirubin Urine: NEGATIVE
Glucose, UA: NEGATIVE mg/dL
Hgb urine dipstick: NEGATIVE
Ketones, ur: NEGATIVE mg/dL
Nitrite: NEGATIVE
Protein, ur: NEGATIVE mg/dL
Specific Gravity, Urine: 1.017 (ref 1.005–1.030)
Urobilinogen, UA: 0.2 mg/dL (ref 0.0–1.0)
pH: 5.5 (ref 5.0–8.0)

## 2010-10-20 LAB — CBC
HCT: 35.4 % — ABNORMAL LOW (ref 36.0–46.0)
MCV: 96.9 fL (ref 78.0–100.0)
Platelets: 210 10*3/uL (ref 150–400)
RDW: 14.1 % (ref 11.5–15.5)
WBC: 7 10*3/uL (ref 4.0–10.5)

## 2010-10-20 LAB — DIFFERENTIAL
Basophils Absolute: 0 10*3/uL (ref 0.0–0.1)
Basophils Relative: 0 % (ref 0–1)
Eosinophils Absolute: 0.2 10*3/uL (ref 0.0–0.7)
Eosinophils Relative: 3 % (ref 0–5)
Lymphs Abs: 3.1 10*3/uL (ref 0.7–4.0)
Neutrophils Relative %: 47 % (ref 43–77)

## 2010-10-20 LAB — POCT CARDIAC MARKERS: Myoglobin, poc: 71.8 ng/mL (ref 12–200)

## 2010-10-24 NOTE — Letter (Signed)
Summary: [facility]  [facility]   Imported By: Marilynne Drivers 10/14/2010 13:09:54  _____________________________________________________________________  External Attachment:    Type:   Image     Comment:   External Document

## 2010-10-29 LAB — BASIC METABOLIC PANEL WITH GFR
BUN: 23 mg/dL (ref 6–23)
BUN: 31 mg/dL — ABNORMAL HIGH (ref 6–23)
CO2: 20 meq/L (ref 19–32)
CO2: 20 meq/L (ref 19–32)
Calcium: 8.8 mg/dL (ref 8.4–10.5)
Calcium: 8.8 mg/dL (ref 8.4–10.5)
Chloride: 111 meq/L (ref 96–112)
Chloride: 112 meq/L (ref 96–112)
Creatinine, Ser: 0.94 mg/dL (ref 0.4–1.2)
Creatinine, Ser: 1.18 mg/dL (ref 0.4–1.2)
GFR calc non Af Amer: 47 mL/min — ABNORMAL LOW
GFR calc non Af Amer: >60 mL/min
Glucose, Bld: 98 mg/dL (ref 70–99)
Glucose, Bld: 98 mg/dL (ref 70–99)
Potassium: 4.2 meq/L (ref 3.5–5.1)
Potassium: 4.2 meq/L (ref 3.5–5.1)
Sodium: 137 meq/L (ref 135–145)
Sodium: 138 meq/L (ref 135–145)

## 2010-10-29 LAB — BASIC METABOLIC PANEL
BUN: 26 mg/dL — ABNORMAL HIGH (ref 6–23)
BUN: 30 mg/dL — ABNORMAL HIGH (ref 6–23)
CO2: 19 mEq/L (ref 19–32)
CO2: 20 mEq/L (ref 19–32)
Calcium: 8.2 mg/dL — ABNORMAL LOW (ref 8.4–10.5)
Calcium: 8.8 mg/dL (ref 8.4–10.5)
Calcium: 9.2 mg/dL (ref 8.4–10.5)
Chloride: 109 mEq/L (ref 96–112)
Chloride: 110 mEq/L (ref 96–112)
Creatinine, Ser: 1.05 mg/dL (ref 0.4–1.2)
Creatinine, Ser: 1.19 mg/dL (ref 0.4–1.2)
Creatinine, Ser: 1.19 mg/dL (ref 0.4–1.2)
GFR calc Af Amer: 56 mL/min — ABNORMAL LOW (ref >60)
GFR calc Af Amer: >60 mL/min (ref >60)
GFR calc non Af Amer: 46 mL/min — ABNORMAL LOW (ref >60)
GFR calc non Af Amer: 53 mL/min — ABNORMAL LOW (ref >60)
Glucose, Bld: 101 mg/dL — ABNORMAL HIGH (ref 70–99)
Glucose, Bld: 110 mg/dL — ABNORMAL HIGH (ref 70–99)
Potassium: 4 mEq/L (ref 3.5–5.1)
Sodium: 134 mEq/L — ABNORMAL LOW (ref 135–145)
Sodium: 136 mEq/L (ref 135–145)
Sodium: 136 mEq/L (ref 135–145)

## 2010-10-29 LAB — PROTIME-INR
INR: 0.94 (ref 0.00–1.49)
INR: 1.05 (ref 0.00–1.49)
Prothrombin Time: 12.5 seconds (ref 11.6–15.2)
Prothrombin Time: 13.6 seconds (ref 11.6–15.2)

## 2010-10-29 LAB — COMPREHENSIVE METABOLIC PANEL
ALT: 18 U/L (ref 0–35)
AST: 19 U/L (ref 0–37)
Albumin: 3.6 g/dL (ref 3.5–5.2)
Alkaline Phosphatase: 86 U/L (ref 39–117)
CO2: 21 mEq/L (ref 19–32)
Chloride: 111 mEq/L (ref 96–112)
Creatinine, Ser: 1.33 mg/dL — ABNORMAL HIGH (ref 0.4–1.2)
GFR calc non Af Amer: 41 mL/min — ABNORMAL LOW (ref >60)
Potassium: 4 mEq/L (ref 3.5–5.1)
Total Bilirubin: 0.5 mg/dL (ref 0.3–1.2)

## 2010-10-29 LAB — CBC
HCT: 30.5 % — ABNORMAL LOW (ref 36.0–46.0)
HCT: 34.1 % — ABNORMAL LOW (ref 36.0–46.0)
HCT: 34.2 % — ABNORMAL LOW (ref 36.0–46.0)
HCT: 35.5 % — ABNORMAL LOW (ref 36.0–46.0)
HCT: 35.8 % — ABNORMAL LOW (ref 36.0–46.0)
HCT: 36.2 % (ref 36.0–46.0)
Hemoglobin: 10.5 g/dL — ABNORMAL LOW (ref 12.0–15.0)
Hemoglobin: 11 g/dL — ABNORMAL LOW (ref 12.0–15.0)
Hemoglobin: 11.1 g/dL — ABNORMAL LOW (ref 12.0–15.0)
Hemoglobin: 11.5 g/dL — ABNORMAL LOW (ref 12.0–15.0)
Hemoglobin: 11.7 g/dL — ABNORMAL LOW (ref 12.0–15.0)
Hemoglobin: 12.3 g/dL (ref 12.0–15.0)
Hemoglobin: 12.4 g/dL (ref 12.0–15.0)
Hemoglobin: 12.5 g/dL (ref 12.0–15.0)
MCHC: 33.7 g/dL (ref 30.0–36.0)
MCHC: 34 g/dL (ref 30.0–36.0)
MCHC: 34.4 g/dL (ref 30.0–36.0)
MCHC: 34.5 g/dL (ref 30.0–36.0)
MCHC: 34.5 g/dL (ref 30.0–36.0)
MCHC: 34.5 g/dL (ref 30.0–36.0)
MCHC: 34.6 g/dL (ref 30.0–36.0)
MCV: 94.5 fL (ref 78.0–100.0)
MCV: 94.9 fL (ref 78.0–100.0)
MCV: 94.9 fL (ref 78.0–100.0)
MCV: 95.1 fL (ref 78.0–100.0)
Platelets: 187 10*3/uL (ref 150–400)
Platelets: 197 K/uL (ref 150–400)
Platelets: 198 K/uL (ref 150–400)
Platelets: 200 K/uL (ref 150–400)
Platelets: 220 K/uL (ref 150–400)
RBC: 3.19 MIL/uL — ABNORMAL LOW (ref 3.87–5.11)
RBC: 3.21 MIL/uL — ABNORMAL LOW (ref 3.87–5.11)
RBC: 3.41 MIL/uL — ABNORMAL LOW (ref 3.87–5.11)
RBC: 3.59 MIL/uL — ABNORMAL LOW (ref 3.87–5.11)
RBC: 3.76 MIL/uL — ABNORMAL LOW (ref 3.87–5.11)
RBC: 3.78 MIL/uL — ABNORMAL LOW (ref 3.87–5.11)
RBC: 3.81 MIL/uL — ABNORMAL LOW (ref 3.87–5.11)
RDW: 13.5 % (ref 11.5–15.5)
RDW: 13.6 % (ref 11.5–15.5)
RDW: 13.8 % (ref 11.5–15.5)
RDW: 13.9 % (ref 11.5–15.5)
RDW: 13.9 % (ref 11.5–15.5)
RDW: 13.9 % (ref 11.5–15.5)
RDW: 14 % (ref 11.5–15.5)
RDW: 14.1 % (ref 11.5–15.5)
WBC: 5.8 K/uL (ref 4.0–10.5)
WBC: 6.6 K/uL (ref 4.0–10.5)
WBC: 6.8 K/uL (ref 4.0–10.5)
WBC: 7 10*3/uL (ref 4.0–10.5)
WBC: 7.3 K/uL (ref 4.0–10.5)
WBC: 7.4 10*3/uL (ref 4.0–10.5)

## 2010-10-29 LAB — POCT I-STAT, CHEM 8
BUN: 35 mg/dL — ABNORMAL HIGH (ref 6–23)
Calcium, Ion: 1.26 mmol/L (ref 1.12–1.32)
Chloride: 113 meq/L — ABNORMAL HIGH (ref 96–112)
Creatinine, Ser: 0.9 mg/dL (ref 0.4–1.2)
Glucose, Bld: 100 mg/dL — ABNORMAL HIGH (ref 70–99)
HCT: 36 % (ref 36.0–46.0)
Hemoglobin: 12.2 g/dL (ref 12.0–15.0)
Potassium: 4.2 meq/L (ref 3.5–5.1)
Sodium: 141 meq/L (ref 135–145)
TCO2: 17 mmol/L (ref 0–100)

## 2010-10-29 LAB — APTT: aPTT: 27 seconds (ref 24–37)

## 2010-10-29 LAB — DIFFERENTIAL
Basophils Absolute: 0 10*3/uL (ref 0.0–0.1)
Basophils Relative: 0 % (ref 0–1)
Lymphocytes Relative: 43 % (ref 12–46)
Monocytes Absolute: 0.3 10*3/uL (ref 0.1–1.0)
Neutro Abs: 3 10*3/uL (ref 1.7–7.7)
Neutrophils Relative %: 49 % (ref 43–77)

## 2010-10-29 LAB — RETICULOCYTES
RBC.: 3.74 MIL/uL — ABNORMAL LOW (ref 3.87–5.11)
Retic Count, Absolute: 33.7 K/uL (ref 19.0–186.0)
Retic Ct Pct: 0.9 % (ref 0.4–3.1)

## 2010-10-29 LAB — CARDIAC PANEL(CRET KIN+CKTOT+MB+TROPI)
CK, MB: 1.5 ng/mL (ref 0.3–4.0)
Total CK: 58 U/L (ref 7–177)
Troponin I: 0.03 ng/mL (ref 0.00–0.06)

## 2010-10-29 LAB — HEPARIN LEVEL (UNFRACTIONATED)
Heparin Unfractionated: 0.39 IU/mL (ref 0.30–0.70)
Heparin Unfractionated: 0.39 IU/mL (ref 0.30–0.70)
Heparin Unfractionated: 0.43 IU/mL (ref 0.30–0.70)
Heparin Unfractionated: 0.77 IU/mL — ABNORMAL HIGH (ref 0.30–0.70)

## 2010-10-29 LAB — CK TOTAL AND CKMB (NOT AT ARMC)
CK, MB: 1.5 ng/mL (ref 0.3–4.0)
Relative Index: INVALID (ref 0.0–2.5)
Total CK: 63 U/L (ref 7–177)

## 2010-10-29 LAB — POCT CARDIAC MARKERS
CKMB, poc: <1 ng/mL — ABNORMAL LOW (ref 1.0–8.0)
Myoglobin, poc: 69.6 ng/mL (ref 12–200)
Troponin i, poc: <0.05 ng/mL (ref 0.00–0.09)

## 2010-10-29 LAB — IRON AND TIBC
Iron: 66 ug/dL (ref 42–135)
Saturation Ratios: 19 % — ABNORMAL LOW (ref 20–55)
TIBC: 355 ug/dL (ref 250–470)
UIBC: 289 ug/dL

## 2010-10-29 LAB — HEMOCCULT GUIAC POC 1CARD (OFFICE): Fecal Occult Bld: NEGATIVE

## 2010-10-29 LAB — FOLATE: Folate: 15.8 ng/mL

## 2010-10-29 LAB — D-DIMER, QUANTITATIVE: D-Dimer, Quant: 0.75 ug/mL-FEU — ABNORMAL HIGH (ref 0.00–0.48)

## 2010-10-29 LAB — FERRITIN: Ferritin: 47 ng/mL (ref 10–291)

## 2010-10-29 LAB — VITAMIN B12: Vitamin B-12: 401 pg/mL (ref 211–911)

## 2010-11-02 LAB — POCT I-STAT, CHEM 8
Calcium, Ion: 1.1 mmol/L — ABNORMAL LOW (ref 1.12–1.32)
Chloride: 107 mEq/L (ref 96–112)
HCT: 39 % (ref 36.0–46.0)
Potassium: 3.4 mEq/L — ABNORMAL LOW (ref 3.5–5.1)
Sodium: 139 mEq/L (ref 135–145)

## 2010-11-02 LAB — COMPREHENSIVE METABOLIC PANEL
AST: 130 U/L — ABNORMAL HIGH (ref 0–37)
AST: 36 U/L (ref 0–37)
Albumin: 2.8 g/dL — ABNORMAL LOW (ref 3.5–5.2)
CO2: 24 mEq/L (ref 19–32)
Calcium: 8.5 mg/dL (ref 8.4–10.5)
Chloride: 109 mEq/L (ref 96–112)
Creatinine, Ser: 0.85 mg/dL (ref 0.4–1.2)
Creatinine, Ser: 0.86 mg/dL (ref 0.4–1.2)
GFR calc Af Amer: >60 mL/min (ref >60)
GFR calc non Af Amer: >60 mL/min (ref >60)
GFR calc non Af Amer: >60 mL/min (ref >60)
Glucose, Bld: 110 mg/dL — ABNORMAL HIGH (ref 70–99)
Total Bilirubin: 0.8 mg/dL (ref 0.3–1.2)

## 2010-11-02 LAB — HEPATIC FUNCTION PANEL
ALT: 21 U/L (ref 0–35)
Bilirubin, Direct: <0.1 mg/dL (ref 0.0–0.3)

## 2010-11-02 LAB — CARDIAC PANEL(CRET KIN+CKTOT+MB+TROPI)
CK, MB: 150.4 ng/mL — ABNORMAL HIGH (ref 0.3–4.0)
CK, MB: 28.3 ng/mL — ABNORMAL HIGH (ref 0.3–4.0)
CK, MB: 8.6 ng/mL — ABNORMAL HIGH (ref 0.3–4.0)
Relative Index: 12.8 — ABNORMAL HIGH (ref 0.0–2.5)
Relative Index: 13.6 — ABNORMAL HIGH (ref 0.0–2.5)
Relative Index: 3.9 — ABNORMAL HIGH (ref 0.0–2.5)
Relative Index: 4.3 — ABNORMAL HIGH (ref 0.0–2.5)
Relative Index: 9.9 — ABNORMAL HIGH (ref 0.0–2.5)
Total CK: 740 U/L — ABNORMAL HIGH (ref 7–177)
Troponin I: 27.84 ng/mL (ref 0.00–0.06)
Troponin I: 41.21 ng/mL (ref 0.00–0.06)

## 2010-11-02 LAB — PROTIME-INR
INR: 0.9 (ref 0.00–1.49)
Prothrombin Time: 12.1 seconds (ref 11.6–15.2)

## 2010-11-02 LAB — POCT CARDIAC MARKERS
CKMB, poc: 7.1 ng/mL (ref 1.0–8.0)
Myoglobin, poc: 138 ng/mL (ref 12–200)
Troponin i, poc: 0.28 ng/mL (ref 0.00–0.09)

## 2010-11-02 LAB — LIPASE, BLOOD: Lipase: 18 U/L (ref 11–59)

## 2010-11-02 LAB — LIPID PANEL
Cholesterol: 192 mg/dL (ref 0–200)
HDL: 48 mg/dL (ref >39)
Triglycerides: 278 mg/dL — ABNORMAL HIGH (ref <150)

## 2010-11-02 LAB — CBC
HCT: 37.2 % (ref 36.0–46.0)
Hemoglobin: 12.7 g/dL (ref 12.0–15.0)
Hemoglobin: 12.9 g/dL (ref 12.0–15.0)
MCHC: 34.1 g/dL (ref 30.0–36.0)
Platelets: 203 10*3/uL (ref 150–400)
RBC: 3.9 MIL/uL (ref 3.87–5.11)
RDW: 13.5 % (ref 11.5–15.5)
WBC: 8.1 10*3/uL (ref 4.0–10.5)
WBC: 8.6 10*3/uL (ref 4.0–10.5)

## 2010-11-02 LAB — BASIC METABOLIC PANEL
BUN: 13 mg/dL (ref 6–23)
Chloride: 104 mEq/L (ref 96–112)
Glucose, Bld: 107 mg/dL — ABNORMAL HIGH (ref 70–99)
Potassium: 3.8 mEq/L (ref 3.5–5.1)

## 2010-11-02 LAB — GLUCOSE, CAPILLARY: Glucose-Capillary: 101 mg/dL — ABNORMAL HIGH (ref 70–99)

## 2010-11-02 LAB — HEMOGLOBIN A1C: Hgb A1c MFr Bld: 5.8 % (ref 4.6–6.1)

## 2010-11-02 LAB — TSH: TSH: 3.22 u[IU]/mL (ref 0.350–4.500)

## 2010-11-05 ENCOUNTER — Telehealth: Payer: Self-pay | Admitting: Cardiology

## 2010-11-05 MED ORDER — ROSUVASTATIN CALCIUM 20 MG PO TABS: 20.0000 mg | ORAL_TABLET | Freq: Every day | ORAL | Status: DC

## 2010-11-05 NOTE — Telephone Encounter (Signed)
Refill done.  

## 2010-11-29 ENCOUNTER — Other Ambulatory Visit: Payer: Self-pay | Admitting: Gastroenterology

## 2010-11-29 NOTE — Telephone Encounter (Signed)
ok 

## 2010-12-02 ENCOUNTER — Other Ambulatory Visit: Payer: Self-pay | Admitting: Gastroenterology

## 2010-12-02 MED ORDER — AMITRIPTYLINE HCL 50 MG PO TABS: 50.0000 mg | ORAL_TABLET | Freq: Every evening | ORAL | Status: DC | PRN

## 2010-12-02 NOTE — Telephone Encounter (Signed)
Med sent in today

## 2010-12-02 NOTE — Telephone Encounter (Signed)
Sent medication already

## 2010-12-05 ENCOUNTER — Telehealth: Payer: Self-pay | Admitting: Cardiology

## 2010-12-05 DIAGNOSIS — I10 Essential (primary) hypertension: Secondary | ICD-10-CM

## 2010-12-05 DIAGNOSIS — D649 Anemia, unspecified: Secondary | ICD-10-CM

## 2010-12-05 DIAGNOSIS — I2510 Atherosclerotic heart disease of native coronary artery without angina pectoris: Secondary | ICD-10-CM

## 2010-12-05 NOTE — Telephone Encounter (Signed)
Pt would like Dr. [name] to schedule some lab work .  Pt has stop taken bisoprolol . C/O pale, ? Kidney problems.

## 2010-12-05 NOTE — Telephone Encounter (Signed)
I called the pt back and gave her instructions to come into the office on Friday for a BMP, CBCD and Urinalysis.  The pt agreed with plan.  I also instructed the pt to call the office first thing tomorrow morning to see if the PA had any cancellations on his schedule.  The pt agreed with plan.

## 2010-12-05 NOTE — Telephone Encounter (Signed)
I spoke with the pt and she is complaining of pressure in her lower abdomen for 2 days.  The pt thinks this is coming from her kidneys.  The pt denies any burning or foul smell with urination. I instructed the pt to call her PCP and try to arrange an appointment for further evaluation.  I will call the pt again around 4 to see if she was able to get an appointment scheduled.

## 2010-12-05 NOTE — Telephone Encounter (Signed)
The pt is going to see Dr Jana Hakim next Wednesday.

## 2010-12-06 ENCOUNTER — Other Ambulatory Visit (INDEPENDENT_AMBULATORY_CARE_PROVIDER_SITE_OTHER): Payer: Medicare Other | Admitting: *Deleted

## 2010-12-06 DIAGNOSIS — D649 Anemia, unspecified: Secondary | ICD-10-CM

## 2010-12-06 DIAGNOSIS — I2510 Atherosclerotic heart disease of native coronary artery without angina pectoris: Secondary | ICD-10-CM

## 2010-12-06 DIAGNOSIS — I10 Essential (primary) hypertension: Secondary | ICD-10-CM

## 2010-12-06 LAB — CBC WITH DIFFERENTIAL/PLATELET
Basophils Relative: 0.4 % (ref 0.0–3.0)
Eosinophils Absolute: 0.1 10*3/uL (ref 0.0–0.7)
Lymphs Abs: 1.7 10*3/uL (ref 0.7–4.0)
MCHC: 34.5 g/dL (ref 30.0–36.0)
MCV: 98.1 fl (ref 78.0–100.0)
Monocytes Absolute: 0.3 10*3/uL (ref 0.1–1.0)
Neutro Abs: 3 10*3/uL (ref 1.4–7.7)
Neutrophils Relative %: 58.4 % (ref 43.0–77.0)
RBC: 3.64 Mil/uL — ABNORMAL LOW (ref 3.87–5.11)

## 2010-12-06 LAB — URINALYSIS
Bilirubin Urine: NEGATIVE
Hgb urine dipstick: NEGATIVE
Ketones, ur: NEGATIVE
Urine Glucose: NEGATIVE
Urobilinogen, UA: 0.2 (ref 0.0–1.0)

## 2010-12-06 LAB — BASIC METABOLIC PANEL
CO2: 26 mEq/L (ref 19–32)
Chloride: 113 mEq/L — ABNORMAL HIGH (ref 96–112)
Creatinine, Ser: 1.2 mg/dL (ref 0.4–1.2)

## 2010-12-10 NOTE — Cardiovascular Report (Signed)
[name], PORCHE NO.:  [id number]   MEDICAL RECORD NO.:  [id number]          PATIENT TYPE:  INP   LOCATION:  2902                         FACILITY:  [facility]   PHYSICIAN:  Loretha Brasil. [name], MD, FACCDATE OF BIRTH:  [birth year]   DATE OF PROCEDURE:  03/07/2009  DATE OF DISCHARGE:                            CARDIAC CATHETERIZATION   INDICATIONS:  The patient is a 65 year old who was called in by EMS as a  code STEMI.  Compared to an old EKG, she has worrisome changes, but not  completely diagnostic.  In addition, the patient has a history of  ongoing chest pain now and into the arms.  She is a smoker, and  hypertensive, but has stopped her medicines.  She was brought to the  emergency room where she was evaluated.  With ongoing symptoms despite  nitroglycerin and heparin, it was felt that urgent catheterization was  warranted and indicated, and the cath lab had been called in with the  code STEMI.  We did get an iSTAT in the emergency room which revealed  that the BUN and creatinine were satisfactory.   PROCEDURES:  1. Left heart catheterization.  2. Selective coronary arteriography.  3. Selective left ventriculography.  4. Percutaneous angioplasty of left anterior descending artery using a      non-drug-eluting stent.  5. Femoral closure.   DESCRIPTION OF PROCEDURE:  The patient was brought to the cath lab,  prepped and draped in the usual fashion.  Through an anterior puncture,  the femoral artery was easily entered.  A 6-French sheath was initially  placed.  Four thousand units of heparin had been given.  Views of the  left and right coronary arteries were obtained.  The LAD was occluded  just beyond its ostium.  In addition, the vessel was collateralized.  As  a result, preparations were made for percutaneous intervention.  Clopidogrel was given as a 600 mg load.  Bivalirudin was given according  to protocol.  ACT was subsequently checked.  We used a JL-35  guiding  catheter.  A Prowater wire was fashioned at the tip to make a sharp bend  in order to get in to the LAD.  We were able to cross the lesion and get  the wire down the vessel.  We used a 2 mm x 15 apex balloon to provide  initial dilatation, and the cath lab arrival to balloon time or first  device time was 28 minutes.  Overall, the first device time was 49  minutes.  This was dilated.  Following this, multiple views were  obtained.  We gave intracoronary nitroglycerin subsequently, we upgraded  to a 2.5 x 12 balloon and additional dilatations performed.  We then  elected to place a 23 x 25 Mini-Vision non-drug-eluting platform.  Importantly, the patient has a history of hypertension, having stopped  her medications.  We deployed this stent at approximately 13-14  atmospheres.  Post dilatations were then done with a 2.75 Quantum Apex  balloon throughout.  The proximal portion was then dilated with 3-mm  balloon.  There was marked improvement in  the appearance of the artery.  We gave intracoronary verapamil to improve distal perfusion, and this  actually improved and the ST is improved as well.  There was a fair  amount of distal plaquing, but no critical disease.  Subsequently, all  catheters were removed and left ventricular pressure was then measured  and ventriculography performed in the RAO projection.  The pigtail was  then removed.  The groin was then prepped carefully.  Adequate time was  given, gloves were exchanged.  Appropriately trained cath lab staff then  Angio-Sealed the right femoral artery with excellent hemostasis.  She  was taken to the holding area in satisfactory clinical condition.   I then discussed the case with her son.  This was reviewed in detail.   HEMODYNAMIC DATA:  Central aortic pressure was 120/80 with a mean of 99.  LV pressure 122/36.   ANGIOGRAPHIC DATA:  1. Ventriculography reveals an anteroapical wall motion abnormality.      Because of the  ectopy, it is hard to classify as hypo or akinetic,      but clearly there was a wall motion abnormality involving the LAD      territory.  There was also some mitral regurgitation, but with the      ectopy, it was hard to quantify.  This will need to be assessed      later with echocardiography.  2. The right coronary artery is a fairly large-caliber vessel.  It has      30-40% plaquing proximally and in the midvessel.  It is not      critical.  There is collateralization of the distal LAD system      which is well visualized.  3. The left main is free of critical disease.  4. The LAD is essentially occluded proximally after a tiny diagonal.      After reperfusion, there was a fairly long segment of vessel that      was segmentally diseased leading up to a second diagonal branch.      Between the 2 diagonals, the vessel was stented with a 2.5 x 23      Mini-Vision non-drug-eluting platform and then appropriately      postdilated.  There was marked improvement with reduction to 0% and      restoration of TIMI III flow.  There was some bifurcational disease      being about 50% in the ostium of the second diagonal and 40-50% in      the LAD afterwards, but flow appeared to be adequate.  There was a      third bifurcation more distally that appeared to have some mild      plaque as well at the diagonal and LAD, but this was hard to      quantify and certainly not felt to be high grade.  5. The circumflex provides too large marginal branches and is free of      critical disease.   IMPRESSION:  1. Anterior wall myocardial infarction with occlusion of the proximal      left anterior descending artery with successful myocardial      reperfusion using a non-drug-eluting platform.  2. History of tobacco use.  3. Hypertension, previously untreated.   DISPOSITION:  The patient will need aggressive risk factor reduction.  Aspirin and Plavix for minimum of a year will be recommended.  She has  a  history of medical noncompliance, and this will need to be monitored and  reinforced.  Cardiac rehab will be obtained.  She will be started on  Crestor and beta blockade as well as an ACE inhibitor.      Loretha Brasil. [name], MD, [facility]  Electronically Signed     TDS/MEDQ  D:  03/07/2009  T:  03/07/2009  Job:  [id number]   cc:   Loretha Brasil. [name], MD, [facility]  CV Laboratory  [place] Ashok Cordia, M.D.

## 2010-12-10 NOTE — Op Note (Signed)
[name], [name] NO.:  [id number]   MEDICAL RECORD NO.:  [id number]          PATIENT TYPE:  INP   LOCATION:  3037                         FACILITY:  [facility]   PHYSICIAN:  Kary Kos, M.D.        DATE OF BIRTH:  01-16-1946   DATE OF PROCEDURE:  04/26/2008  DATE OF DISCHARGE:                               OPERATIVE REPORT   PREOPERATIVE DIAGNOSIS:   Dictation ended at this point.           ______________________________  Kary Kos, M.D.     GC/MEDQ  D:  04/26/2008  T:  04/27/2008  Job:  [id number]

## 2010-12-10 NOTE — Op Note (Signed)
[name], [name] NO.:  [id number]   MEDICAL RECORD NO.:  [id number]          PATIENT TYPE:  INP   LOCATION:  2899                         FACILITY:  [facility]   PHYSICIAN:  Kary Kos, M.D.        DATE OF BIRTH:  [birth year]   DATE OF PROCEDURE:  07/02/2007  DATE OF DISCHARGE:                               OPERATIVE REPORT   PREOPERATIVE DIAGNOSIS:  Cervical spondylitic radiculopathy, C5, left.   PROCEDURE:  Intracervical diskectomy and fusion at C4-5 using an 8 mm  allograft wedge and a 25 mm Venture plate with four 13 mm variable  angled screws.   SURGEON:  Kary Kos, MD   ASSISTANT:  Trenton Gammon.   ANESTHESIA:  General endotracheal.   HISTORY OF PRESENT ILLNESS:  The patient is a very pleasant 65 year old  female who has had predominantly neck and left shoulder pain radiating  down through her deltoid down above her elbow.  She denies any recent  radiation down below the elbow into her hands.  She in the past has had  occasional numbness in her hands, but it has mostly been the last two  fingers.  Her MRI scan showed severe spondylosis -5, 5-6 and 6-7 with  foraminal stenosis on the left at all three levels, however, most of her  symptoms all seem to be C5 and so the patient was recommended ACDF at C4-  5 after failure of all forms of conservative treatment.  Risks and  benefits of the operation were explained to the patient.  She  understands and agreed to proceed forth.   The patient was brought to the OR and was induced under general  anesthesia.  She was positioned supine.  The neck was placed in  extension, 5 pounds of Holter traction on the right side.  The neck was  prepped and draped in the usual sterile fashion.  Preoperative x-ray  localized the appropriate level with C-arm.  A curvilinear incision was  made just off the midline to the sternocleidomastoid and superficial  layer of the platysma was dissected and divided longitudinally.  The  avascular planes between the sternocleidomastoid and strap muscle was  developed down through the prevertebral fascia.  The prevertebral fascia  was then dissected with Kitners.  Intraoperative x-ray confirmed  localization of the appropriate level and an annulotomy was done with a  15 blade scalpel, marking the disk space and the longus colli was  reflected laterally and self-retaining retractor was placed.  A this  point, the annulotomy was extended.  The disk space was cleaned out.  A  large anterior osteophyte was removed from the C4 vertebral body with 2  and 3 mm Kerrison punch.  Then the cutaneous space was drilled down to  posterior annulus and posterior osteophytic complexes.   At this point, the operative microscope was draped under a field of  microscopic illumination, the remainder of the posterior annulus and  posterior osteophyte was drilled down.  Then using a 1 mm Kerrison  punch, underbiting of the C4 vertebral body and C5 vertebral body was  used to  identify the PLL and dura and this was all underbitten and  removed in a piecemeal fashion, exposing the thecal sac and  decompressing out to the level of the C5 pedicle on both side.  There is  a marked spondylosis with a large osteophyte coming off the posterior  aspect of the C4 vertebral body at the level of the end plate  compressing the left sided C5 nerve root.  This was all underbitten and  removed in a piecemeal fashion.  The C5 nerve root was skeletonized  flush with the pedicle and was widely decompressed.  This procedure was  repeated on the right side decompressing the right C5 nerve root.  Then  after both end plates were confirmed to be aggressively underbitten  decompressing the central canal, the end plates were scraped.  A size 8  graft was then inserted 1 to 2 mm deep to the anterior vertebral body.  Then a 25 mm Venture plate was inserted.  All four screws were placed  with excellent purchase and the  locking mechanism was engaged.  Meticulous hemostasis was then maintained.  The wound was then closed in  layers with interrupted Vicryl and the platysma in a running 4-0  subcuticular.  At the end of the case, there was initially a miscount of  the patties so the incision was again opened up and then reexplored and  then closed again with interrupted Vicryls and the count subsequently,  this was found that it was correct.  At the end of the case, ultimately,  needle counts and sponges were correct.  The wound was then dressed.  The patient went to the recovery room in stable condition at the end of  the case.           ______________________________  Kary Kos, M.D.     GC/MEDQ  D:  07/02/2007  T:  07/02/2007  Job:  [id number]

## 2010-12-10 NOTE — Discharge Summary (Signed)
[name], [name] NO.:  [id number]   MEDICAL RECORD NO.:  [id number]          PATIENT TYPE:  INP   LOCATION:  2902                         FACILITY:  [facility]   PHYSICIAN:  Shaune Pascal. Bensimhon, MDDATE OF BIRTH:  15-Jun-1946   DATE OF ADMISSION:  03/07/2009  DATE OF DISCHARGE:  03/10/2009                               DISCHARGE SUMMARY   PRIMARY CARDIOLOGIST:  Marcello Moores D. [name], MD, [facility]   DISCHARGE DIAGNOSES:  1. Coronary artery disease with anterior myocardial infarction on      August 11, status post cardiac catheterization with placement of a      bare-metal stent to the left anterior descending coronary artery      with a relook catheterization on August 13 for chest pain with a      patent stent.  2. Ongoing tobacco abuse.  3. Left ventricular ejection fraction of 45%.   PAST MEDICAL HISTORY:  1. Hypertension, untreated.  2. Tobacco abuse.  3. Migraines.  4. Irritable bowel/GERD.   [name] is a 65 year old female with no history of coronary artery  disease who presented with complaints of chest discomfort with the pain  radiating into both arms and to her back.  She presented to [facility]  by EMS and was taken urgently to the cath lab.  With positive troponins,  by Dr. [name] on August 11, the patient with anterior wall MI underwent  bare-metal stent placement without complications.  The patient returned  to the unit, cardiac rehab.  Smoking cessation education provided.  The  patient continued to have some intermittent chest discomfort, returned  to the lab on the 13th for a relook, stent was patent.  On day of  discharge, patient is stable, no further episodes of chest discomfort,  ambulating in hall.  Creatinine 0.8, potassium 3.7.  Cardiac enzymes  trending down.  The patient stable to be discharged home with follow up  with Dr. [name] in our office, office will call the patient with date  and time.   MEDICATIONS AT TIME OF DISCHARGE:  1.  Aspirin 325.  2. Carvedilol 6.25 b.i.d.  3. Plavix 75 daily.  4. Lisinopril 2.5 mg daily.  5. Nicotine patch 14 mg, 24-hour patch.  6. Crestor 40 mg.  7. Amitriptyline 50 mg at bedtime.   The patient has been given the post cardiac catheterization discharge  instructions along with myocardial infarction discharge instructions.   Duration of discharge encounter is over 30 minutes.      Rosanne Sack, ACNP      Shaune Pascal. Bensimhon, MD  Electronically Signed    MB/MEDQ  D:  03/10/2009  T:  03/10/2009  Job:  [id number]

## 2010-12-10 NOTE — Cardiovascular Report (Signed)
[name], [name] NO.:  [id number]   MEDICAL RECORD NO.:  [id number]          PATIENT TYPE:  INP   LOCATION:  2902                         FACILITY:  [facility]   PHYSICIAN:  Loretha Brasil. [name], MD, FACCDATE OF BIRTH:  1945/08/02   DATE OF PROCEDURE:  03/09/2009  DATE OF DISCHARGE:                            CARDIAC CATHETERIZATION   INDICATIONS:  [name] is a 65 year old who presented 2 days ago with  an acute anterior wall infarction.  She was treated with primary  stenting of the left anterior descending artery.  There was residual  disease involving the vessel.  She has had some modest EKG changes, a  borderline increase in troponin, and some recurrent chest pain.  As a  result, she was brought back to the Catheterization Laboratory briefly  for a relook.   PROCEDURE:  1. Placement of catheters for a coronary angiography.  2. Left coronary angiography.   DESCRIPTION OF PROCEDURE:  The patient was brought to the cath lab and  prepped and draped in usual fashion.  Through an anterior puncture, the  left femoral artery was entered.  A 4-French catheter was placed.  Following this, a left 4 Judkins catheter was placed in the central  aorta and views of left coronary artery were obtained.  She tolerated  this well.  There were no complications.  Further intervention was felt  not to be indicated.  All catheters were subsequently removed, and she  was moved to the holding area for sheath removal.   HEMODYNAMIC DATA:  Central aortic pressure 98/64.   ANGIOGRAPHIC DATA:  1. The left main is free of critical disease.  2. The LAD as previously noted infarct artery.  It has an upward      takeoff and then there is a previously placed stent in the      midvessel.  The stent itself appears widely patent without      significant renarrowing.  There is a tiny diagonal that comes out      the proximal portion of the stent with some pinching at the ostium      of  perhaps 80% and also a small septal with similar findings.  At      the distal end of the stent, there does not appear to be an edge      tear.  There is evidence of fairly significant atherosclerotic      plaque just beyond the edges of the stent.  This has been      previously noted, but is where the takeoff of the first major      diagonal branches.  The diagonal branch itself has a narrow angle      and has about 70% narrowing.  The native LAD has about 50%      narrowing.  The native LAD does not appear to be flow-limiting and      is clearly well beyond the stent.  There is a second area of 50%      narrowing more distally in the vessel at a steep bend takeoff.  Distal LAD wraps the apex.  3. The circumflex is a large-caliber vessel, and as previously noted      on the last procedure 2 days ago, it has 2 large marginal branches      and without focal narrowing.   CONCLUSION:  1. Continued patency of the infarct-related artery with diffuse      disease in multiple locations as previously noted.  2. No significant obstruction in the circumflex vessel.  3. We did not inject the right coronary artery as this was felt to be      patent at that time.   The patient will be treated medically.  She will be transported back to  the CCU.      Loretha Brasil. [name], MD, [facility]  Electronically Signed     TDS/MEDQ  D:  03/09/2009  T:  03/10/2009  Job:  [id number]   cc:   CV Laboratory

## 2010-12-10 NOTE — Assessment & Plan Note (Signed)
[facility] OFFICE NOTE   Tammy, Roberts                     MRN:          [id number]  DATE:04/07/2007                            DOB:          1945-09-21    [name] is here for an annual followup.  Altogether she is doing  fairly well.  At the current time, she has a paucity of gastrointestinal  complaints.  Constipation seems to be the major problem.  She takes  magnesium citrate regularly to move her bowels.  She is taking fiber  supplementation.  Last colonoscopy was in 2002.  Her mother had colon  cancer.   MEDICATIONS:  1. Topamax.  2. Amitriptyline.  3. Premarin.  4. Omeprazole.   PHYSICAL EXAMINATION:  Pulse 92, blood pressure 160/90, weight 140.  HEENT: EOMI.  PERRLA.  Sclerae are anicteric.  Conjunctivae are pink.  NECK:  Supple without thyromegaly, adenopathy or carotid bruits.  CHEST:  Clear to auscultation and percussion without adventitious  sounds.  CARDIAC:  Regular rhythm; normal S1 S2.  There are no murmurs, gallops  or rubs.  ABDOMEN:  Bowel sounds are normoactive.  Abdomen is soft, nontender and  nondistended.  There are no abdominal masses, tenderness, splenic  enlargement or hepatomegaly.  EXTREMITIES:  Full range of motion.  No cyanosis, clubbing or edema.  RECTAL:  Deferred.   IMPRESSION:  1. Nonspecific dyspepsia, quite stable at this time.  2. Family history of colorectal cancer.  3. Irritable bowel syndrome/constipation predominant.   RECOMMENDATION:  1. Continue current medications.  2. Trial of MiraLax for constipation.  3. Followup colonoscopy.     [name]. Deatra Ina, MD,FACG  Electronically Signed    RDK/MedQ  DD: 04/07/2007  DT: 04/07/2007  Job #: [id number]

## 2010-12-10 NOTE — Op Note (Signed)
[name], [name] NO.:  [id number]   MEDICAL RECORD NO.:  [id number]          PATIENT TYPE:  INP   LOCATION:  3037                         FACILITY:  [facility]   PHYSICIAN:  Kary Kos, M.D.        DATE OF BIRTH:  [birth year]   DATE OF PROCEDURE:  04/26/2008  DATE OF DISCHARGE:                               OPERATIVE REPORT   PREOPERATIVE DIAGNOSES:  Degenerative disk disease, lumbar spinal  stenosis L4-5 and L5-S1 with L5-S1 radiculopathies, right greater than  left.   PROCEDURES:  1. Decompressive lumbar laminectomies at L4-5 and L5-S1 in excess of      what we needed with a standard interbody fusion.  2. Posterior lumbar interbody fusion at L4-5 and L5-S1 using a Hybrid      Telamon PEEK cage packed with local autograft, mixed with Actifuse      and Tangent allograft wedges at each level.  3. Pedicle screw fixation at L4-S1 using the CD Horizon Legacy PEEK      pedicle screw system with PEEK rods and titanium screws, and      placement of medium Hemovac drain.   SURGEON:  Kary Kos, MD   ASSISTANT:  Eustace Moore, MD   ANESTHESIA:  General endotracheal.   HISTORY OF PRESENT ILLNESS:  The patient is a pleasant 65 year old  female who has had progressive worsening back and bilateral leg pain,  worse on the right radiating down the back of her legs, to the fronts of  her shins, tops of her feet and big toes as well as the bottom of the  foot.  The patient is waxed and waned with left versus right leg  radiculopathy, back pain greater than leg pain has got progressively  worse over the last several months, has been failed all forms of  conservative treatment with physical therapy, anti-inflammatories, and  steroid injections.  MRI scan showed degenerative disease at L4-5 and L5-  S1 with central disk herniations, marked facet arthropathy, some extra-  canalicular synovial cysts off the facets at L4-5 and L5-S1 disk  herniation after the right at L5-S1 with  marked facet arthropathy and  diastasis of facet, and fluid inside the joint spaces.  Due to the  patient's failure of conservative treatment, MRI findings, and clinical  presentation, the patient is recommended decompression and  stabilization.  Procedure risks and benefits of the operation were  explained to the patient.  The patient understood and agreed to proceed  forward.   DESCRIPTION OF PROCEDURE:  The patient was brought to the OR, was  induced under general anesthesia, and positioned prone on the Wilson  frame.  Back was prepped and draped in the routine sterile fashion.  After infiltration of 10 mL lidocaine with epi, a midline incision was  made, and Bovie electrocautery was used to take down into the  subperiosteum.  Dissection carried down the lamina of L4-L5 and L5-S1.  TPs at L4, L5, and S1 were all exposed.  The spinous processes were then  removed at L4 and L5.  Central decompression was begun.  Then,  radical  foraminotomies were performed at L4, L5, and S1 with complete medial  facetectomies were performed.  There was marked stenosis predominantly  on the L5 roots bilaterally.  These were all flattened by the overgrowth  of facet complex.  After complete medial facetectomies were performed at  both levels, both L4-5 and L5-S1 roots were skeletonized and flushed  with pedicle and decompressed.  Attention was taken to the interbody  work.  A D'Errico was used to reflect the left L5 nerve root medially.  Epidural vein was coagulated.  Annulotomy was made with an 11-blade  scalpel.  Disk spaces were cleaned out.  A size 10 distractor was  inserted.  This had good apposition of the endplates and felt to be  appropriate size of the graft, so then working on the right side,  cleaning out the disk space with a size 10 cutter and chisel, a 10 x 20  Telamon PEEK cage packed with local autograft mixed with Actifuse was  then inserted.  Then, working with the distractor removed,  the work on  the opposite side, this was cleaned out again, local autograft with  Actifuse was packed centrally, and a Tangent was inserted on the  opposite side.  After L4-5 was completed, in a similar fashion, L5-S1  was done.  L5-S1 was noted to be markedly spondylotic and collapsed with  a large central disk herniation extending rightward out the foramen  underneath the L5 root and the extraforaminal space.  This was all  cleaned out radically with pituitary rongeurs.  At the disk space, a  distractor was inserted, size 10 had good apposition of the endplates,  and felt to be again appropriate sizing for the graft.  This was sized  and then cutting scissor was used to repair the endplates.  A Telamon  was inserted on the patient's right side and attention on the left,  local autograft with Actifuse inserted centrally.  Fluoroscopy was used  at each step along the way to confirm depth and trajectory.  Endplates  were scraped centrally with an Epstein curette and local autograft  packed centrally.  After all the above workup, attention was taken to  the pedicle screw placement.  Using a high-speed drill, power holes were  drilled at L4.  Cannula with the awl probed again from within the  pedicle as well as within the canal, tapped with a 5/5 tap, probed again  and 6.5 x 45 screws at L4 on the left.  In a similar fashion, a 6.5 x 40  was inserted at L5 and a 6.5 x 35 at S1 on the left, and then on the  right 6.5 x 45 at L4, again 6.5 x 40 at L5, and a 6.5 x 35 at S1.  After  all 6 screws have been placed, the wound was copiously irrigated.  Meticulous hemostasis was obtained.  Aggressive decortication was  carried at TPs and lateral gutters.  The remainder of the local  autograft was packed posterolaterally.  Then, the PEEK rods were sized  60 mm, PEEK rods were then inserted, top-tightening nuts tightened  down  at S1 and L5 screw was compressed against S1, and L4 compressed against   L5.  After all the nuts have been torqued off, postop fluoroscopy  confirmed good position of the screws, rods, and bone graft.  Then, the  medium Hemovac drain was placed.  The wound was closed in layers with  interrupted Vicryl with a running 4-0  subcuticular on the skin.  Benzoin  and Steri-Strips were applied.  The patient went to the recovery room in  stable condition.  At the end of the case, needle and instrument counts  were correct.           ______________________________  Kary Kos, M.D.     GC/MEDQ  D:  04/26/2008  T:  04/27/2008  Job:  [id number]

## 2010-12-10 NOTE — H&P (Signed)
NAMEMARYJO, GIRARDI NO.:  [id number]   MEDICAL RECORD NO.:  [id number]          PATIENT TYPE:  INP   LOCATION:  1823                         FACILITY:  [facility]   PHYSICIAN:  Loretha Brasil. [name], MD, FACCDATE OF BIRTH:  1946-03-06   DATE OF ADMISSION:  03/07/2009  DATE OF DISCHARGE:                              HISTORY & PHYSICAL   PRIMARY CARE PHYSICIAN:  Dr. Arnoldo Morale.   CARDIOLOGIST:  None.   CHIEF COMPLAINT:  Chest pain.   HISTORY OF PRESENT ILLNESS:  Ms. Mcmanigle is a 65 year old female with no  history of coronary artery disease.  She had onset of substernal chest  pain.  She describes it as a pressure at approximately 7 o'clock last  night.  Her symptoms continued, but she went to bed without taking any  medication.  Symptoms worsened and woke her at 4 a.m.  At that time, the  pain was in 9/10.  It was associated with nausea and diaphoresis, but  she denies shortness of breath.  She states the pain was radiating to  both arms.  It was also going through her back.  EMS was called.  Her  EKG was abnormal and they treated her with three sublingual  nitroglycerin on the way to [facility].  She was transported to [facility] Emergency Room urgently and is being evaluated by Dr. [name] for  possible code STEMI.  Currently, her chest pain is 4/10.   PAST MEDICAL HISTORY:  1. Hypertension (untreated).  2. Ongoing tobacco use.  3. History of migraines.  4. Irritable bowel syndrome/gastroesophageal reflux disease.   SURGICAL HISTORY:  She is status post back surgery as well as neck  surgery, colonoscopy, hysterectomy, multiple ear surgeries when she was  a child.   ALLERGIES:  SULFA.   CURRENT MEDICATIONS:  1. Amitriptyline 50 mg nightly.  2. Vicodin 5 mg p.r.n.  3. Per Dr. Kelby Fam report Topamax 25 mg 2 tablets daily.  4. Lopressor 25 mg daily.   SOCIAL HISTORY:  She lives in [place] alone.  She is retired.  She  has an approximately 40 pack-a-year  history of ongoing tobacco use, but  denies alcohol and drug abuse.  She drinks a little caffeine on a  regular basis and does not exercise.   FAMILY HISTORY:  Her mother died at age 38 with cancer, but no coronary  artery disease.  Her father died at age 78 in a motor vehicle accident.  She has a sibling with bladder cancer, but no coronary artery disease in  any siblings or parents.   REVIEW OF SYSTEMS:  Other than stated in the HPI, she has a headache.  She also has some nasal discharge and seasonal allergies.  She has  chronic dyspnea on exertion and occasional cough, but denies any  wheezing.  She has chronic arthralgias and back as well as neck pain.  Her main symptom with her IBS is constipation.  Full 14-point review of  systems is otherwise negative.   PHYSICAL EXAMINATION:  VITAL SIGNS:  Blood pressure 127/104, pulse 78,  respiratory rate 20, O2 saturation  99% on 2 L.  GENERAL:  She is a well-developed and well-nourished white female in no  acute distress.  HEENT:  Normal.  NECK:  There is no lymphadenopathy, thyromegaly, bruit, or JVD noted.  CV:  Heart is regular in rate and rhythm with an S1 and S2 with no  significant murmur, rub, or gallop is noted.  Distal pulses are intact  in all four extremities.  LUNGS:  Essentially clear to auscultation bilaterally.  SKIN:  No rashes or lesions are noted.  ABDOMEN:  Soft and nontender with active bowel sounds.  EXTREMITIES:  There is no cyanosis, clubbing, or edema noted.  MUSCULOSKELETAL:  There is no joint deformity or effusion and no spine  or CVA tenderness.  NEUROLOGIC:  She is alert and oriented with cranial nerves II-XII  grossly intact.   Chest x-ray pending.   EKG:  The initial EKG from EMS shows ST elevation in leads V2 through  V4.  In the emergency room after receiving nitroglycerin, her EKG has  improved and no ST elevation is seen.   LABORATORY VALUES:  Hemoglobin 12.9, hematocrit 37.6, WBCs 8.1,  platelets  228.  INR 0.9.  Sodium 139, potassium 3.4, chloride 107, BUN  23, creatinine 0.7, glucose 138.  Point-of-care markers show CK-MB of  7.1, troponin I 0.28, and myoglobin 138.   IMPRESSION:  Ms. Zaslavsky is a 65 year old female with no previous history  of coronary artery disease.  She has had prolonged chest pain partly  responsive to nitroglycerin.  Her EKG was initially abnormal, but has  improved.  However, her pain is ongoing.  Her symptoms are very  concerning for an ST elevation myocardial infarction, and she is being  taken urgently to the catheterization laboratory.  The risks and  benefits of the procedure were discussed by Dr. [name] with the  patient, and she indicates understanding and agrees to proceed.  She  will be started on medications for blood pressure control and we will  check a lipid profile and TSH and a hemoglobin A1c.  Further evaluation  and treatment will depend on the results of the above testing.     Rosaria Ferries, PA-C      Loretha Brasil. [name], MD, [facility]  Electronically Signed   RB/MEDQ  D:  03/07/2009  T:  03/07/2009  Job:  [id number]   cc:   Dr. Arnoldo Morale

## 2010-12-11 ENCOUNTER — Telehealth: Payer: Self-pay | Admitting: Cardiology

## 2010-12-11 NOTE — Telephone Encounter (Signed)
I spoke with the pt and made her aware of lab results.  The pt is scheduled to see her PCP Dr Jana Hakim this afternoon and these results were faxed to [phone].

## 2010-12-11 NOTE — Telephone Encounter (Signed)
Pt needs lab results for her PCP she will be leaving at 1:30 so please call prior to that because she wants to take the information with her

## 2010-12-13 NOTE — Discharge Summary (Signed)
NAMESABIRIN, KRAVCHENKO NO.:  [id number]   MEDICAL RECORD NO.:  [id number]          PATIENT TYPE:  INP   LOCATION:  3037                         FACILITY:  [facility]   PHYSICIAN:  Kary Kos, M.D.        DATE OF BIRTH:  1946-01-07   DATE OF ADMISSION:  04/26/2008  DATE OF DISCHARGE:  04/29/2008                               DISCHARGE SUMMARY   The patient was very pleasant 65 year old female who was admitted to the  hospital as an EMA, underwent the aforementioned procedure of  decompressive laminectomy at L4-L5 and L5-S1 and posterior lumbar  interbody fusion at L4-L5 and L5-S1.  Postoperatively, the patient did  very well in recovery and in the floor.  The patient had no preoperative  and postoperative leg pain;  had a little back stiffness and soreness.  This got better in the next 48 hours.  The patient was mobilized with  physical and occupational therapy, was ambulating and voiding  spontaneously.  Hemovac was taken out on day 2, and the patient was  doing very well and was able to be discharged home on hospital day 4.  At the time of discharge, the patient was tolerating pain and p.o., pain  medication, and bowel and bladder working and she was ambulating well.           ______________________________  Kary Kos, M.D.     GC/MEDQ  D:  05/31/2008  T:  06/01/2008  Job:  [id number]

## 2011-01-02 ENCOUNTER — Encounter: Payer: Self-pay | Admitting: Gastroenterology

## 2011-01-02 ENCOUNTER — Ambulatory Visit (INDEPENDENT_AMBULATORY_CARE_PROVIDER_SITE_OTHER): Payer: Medicare Other | Admitting: Gastroenterology

## 2011-01-02 VITALS — BP 128/76 | HR 76 | Ht 62.0 in | Wt 157.6 lb

## 2011-01-02 DIAGNOSIS — Z860100 Personal history of colon polyps, unspecified: Secondary | ICD-10-CM

## 2011-01-02 DIAGNOSIS — K635 Polyp of colon: Secondary | ICD-10-CM

## 2011-01-02 DIAGNOSIS — K589 Irritable bowel syndrome without diarrhea: Secondary | ICD-10-CM

## 2011-01-02 DIAGNOSIS — Z86711 Personal history of pulmonary embolism: Secondary | ICD-10-CM | POA: Insufficient documentation

## 2011-01-02 DIAGNOSIS — D126 Benign neoplasm of colon, unspecified: Secondary | ICD-10-CM

## 2011-01-02 HISTORY — DX: Personal history of pulmonary embolism: Z86.711

## 2011-01-02 MED ORDER — HYOSCYAMINE SULFATE ER 0.375 MG PO TBCR: EXTENDED_RELEASE_TABLET | ORAL | Status: DC

## 2011-01-02 MED ORDER — PEG-KCL-NACL-NASULF-NA ASC-C 100 G PO SOLR: 1.0000 | Freq: Once | ORAL | Status: DC

## 2011-01-02 NOTE — Patient Instructions (Signed)
Your Colonoscopy is scheduled on 01/07/2011 at 11:30am at [facility]  You can pick up your MoviePrep from your Pharmacy today

## 2011-01-02 NOTE — Assessment & Plan Note (Signed)
She currently has nonspecific symptoms of pain and nausea which likely are related to her IBS.  Recommendations #1 hyomax when necessary

## 2011-01-02 NOTE — Progress Notes (Signed)
History of Present Illness:  Ms. Tompkins has returned for followup of  colon polyps. Since her last visit she's had multiple hospitalizations for problems including renal failure, pulmonary embolus, subacute GI bleeding while on anticoagulants and antiplatelet medications. In November, 2011 colonoscopy demonstrated a sessile cecal polyp and a polyp in the sigmoid which were removed. Adenomatous changes were seen. Question of incomplete removal of her cecal polyp was raised with a recommendation of possible followup colonoscopy in 6 months. She is now off her antiplatelet medications and anticoagulants. Over the last 2 days she's had some upper abdominal discomfort with nausea. She has a history of IBS.    Review of Systems: Pertinent positive and negative review of systems were noted in the above HPI section. All other review of systems were otherwise negative.    Current Medications, Allergies, Past Medical History, Past Surgical History, Family History and Social History were reviewed in [place] record  Vital signs were reviewed in today's medical record. Physical Exam: General: Well developed , well nourished, no acute distress Head: Normocephalic and atraumatic Eyes:  sclerae anicteric, EOMI Ears: Normal auditory acuity Mouth: No deformity or lesions Lungs: Clear throughout to auscultation Heart: Regular rate and rhythm; no murmurs, rubs or bruits Abdomen: Soft and non distended. No masses, hepatosplenomegaly or hernias noted. Normal Bowel sounds; there is mild diffuse tenderness without hernia rebound Rectal:deferred Musculoskeletal: Symmetrical with no gross deformities  Pulses:  Normal pulses noted Extremities: No clubbing, cyanosis, edema or deformities noted Neurological: Alert oriented x 4, grossly nonfocal Psychological:  Alert and cooperative. Normal mood and affect

## 2011-01-02 NOTE — Assessment & Plan Note (Signed)
Plan followup colonoscopy 

## 2011-01-03 NOTE — Assessment & Plan Note (Signed)
Plan f/u colonoscopy

## 2011-01-07 ENCOUNTER — Ambulatory Visit (HOSPITAL_COMMUNITY)
Admission: RE | Admit: 2011-01-07 | Discharge: 2011-01-07 | Disposition: A | Discharge status: Home / Self Care | Payer: Medicare Other | Source: Ambulatory Visit | Attending: Gastroenterology | Admitting: Gastroenterology

## 2011-01-07 ENCOUNTER — Other Ambulatory Visit: Payer: Medicare Other | Admitting: Gastroenterology

## 2011-01-07 DIAGNOSIS — Z860100 Personal history of colon polyps, unspecified: Secondary | ICD-10-CM | POA: Insufficient documentation

## 2011-01-07 DIAGNOSIS — R1310 Dysphagia, unspecified: Secondary | ICD-10-CM | POA: Insufficient documentation

## 2011-01-07 DIAGNOSIS — R109 Unspecified abdominal pain: Secondary | ICD-10-CM | POA: Insufficient documentation

## 2011-01-07 DIAGNOSIS — Z86718 Personal history of other venous thrombosis and embolism: Secondary | ICD-10-CM | POA: Insufficient documentation

## 2011-01-07 DIAGNOSIS — Z79899 Other long term (current) drug therapy: Secondary | ICD-10-CM | POA: Insufficient documentation

## 2011-01-07 DIAGNOSIS — I10 Essential (primary) hypertension: Secondary | ICD-10-CM | POA: Insufficient documentation

## 2011-04-28 LAB — DIFFERENTIAL
Basophils Absolute: 0
Basophils Relative: 0
Eosinophils Absolute: 0.1
Eosinophils Relative: 1
Lymphocytes Relative: 19
Lymphs Abs: 1.9
Monocytes Absolute: 0.6
Monocytes Relative: 6
Neutro Abs: 7.7
Neutrophils Relative %: 75

## 2011-04-28 LAB — COMPREHENSIVE METABOLIC PANEL WITH GFR
ALT: 18
AST: 11
Albumin: 3.1 — ABNORMAL LOW
Alkaline Phosphatase: 178 — ABNORMAL HIGH
BUN: 21
CO2: 21
Calcium: 8.8
Chloride: 109
Creatinine, Ser: 0.88
GFR calc non Af Amer: >60
Glucose, Bld: 110 — ABNORMAL HIGH
Potassium: 4.2
Sodium: 138
Total Bilirubin: 0.4
Total Protein: 6.7

## 2011-04-28 LAB — CBC
HCT: 42.4
HCT: 33.7 — ABNORMAL LOW
Hemoglobin: 14.5
Hemoglobin: 11.3 — ABNORMAL LOW
MCHC: 34.1
MCHC: 33.6
MCV: 97.2
MCV: 95.7
Platelets: 260
RBC: 4.36
RBC: 3.52 — ABNORMAL LOW
RDW: 13.1
WBC: 7.7

## 2011-04-28 LAB — BASIC METABOLIC PANEL
BUN: 14
CO2: 25
Calcium: 9.5
Chloride: 104
Creatinine, Ser: 0.98

## 2011-04-28 LAB — TYPE AND SCREEN
ABO/RH(D): A POS
Antibody Screen: NEGATIVE

## 2011-04-28 LAB — ABO/RH: ABO/RH(D): A POS

## 2011-04-28 LAB — D-DIMER, QUANTITATIVE

## 2011-04-28 LAB — POCT CARDIAC MARKERS: Myoglobin, poc: 72.2

## 2011-05-05 LAB — BASIC METABOLIC PANEL
CO2: 22
Calcium: 9.2
Creatinine, Ser: 0.84
Glucose, Bld: 79

## 2011-05-05 LAB — DIFFERENTIAL
Basophils Absolute: 0
Basophils Relative: 0
Neutro Abs: 3.5
Neutrophils Relative %: 52

## 2011-05-05 LAB — CBC
MCHC: 35
RDW: 13.1

## 2011-05-06 ENCOUNTER — Ambulatory Visit
Admission: RE | Admit: 2011-05-06 | Discharge: 2011-05-06 | Disposition: A | Discharge status: Home / Self Care | Payer: Medicare Other | Source: Ambulatory Visit | Attending: Neurosurgery | Admitting: Neurosurgery

## 2011-05-06 ENCOUNTER — Other Ambulatory Visit: Payer: Self-pay | Admitting: Neurosurgery

## 2011-05-06 DIAGNOSIS — M545 Low back pain: Secondary | ICD-10-CM

## 2011-05-13 ENCOUNTER — Other Ambulatory Visit: Payer: Self-pay | Admitting: Neurosurgery

## 2011-05-13 ENCOUNTER — Other Ambulatory Visit (HOSPITAL_COMMUNITY): Payer: Self-pay | Admitting: Neurosurgery

## 2011-05-13 ENCOUNTER — Encounter (HOSPITAL_COMMUNITY)
Admission: RE | Admit: 2011-05-13 | Discharge: 2011-05-13 | Disposition: A | Discharge status: Home / Self Care | Payer: Medicare Other | Source: Ambulatory Visit | Attending: Neurosurgery | Admitting: Neurosurgery

## 2011-05-13 ENCOUNTER — Telehealth: Payer: Self-pay | Admitting: Cardiology

## 2011-05-13 ENCOUNTER — Other Ambulatory Visit: Payer: Self-pay | Admitting: Physician Assistant

## 2011-05-13 DIAGNOSIS — M4326 Fusion of spine, lumbar region: Secondary | ICD-10-CM

## 2011-05-13 LAB — BASIC METABOLIC PANEL
CO2: 25 mEq/L (ref 19–32)
Chloride: 107 mEq/L (ref 96–112)
Creatinine, Ser: 1.42 mg/dL — ABNORMAL HIGH (ref 0.50–1.10)
Potassium: 5.2 mEq/L — ABNORMAL HIGH (ref 3.5–5.1)
Sodium: 141 mEq/L (ref 135–145)

## 2011-05-13 LAB — CBC
Hemoglobin: 13.2 g/dL (ref 12.0–15.0)
MCHC: 33.9 g/dL (ref 30.0–36.0)
RBC: 3.98 MIL/uL (ref 3.87–5.11)

## 2011-05-13 LAB — SURGICAL PCR SCREEN
MRSA, PCR: POSITIVE — AB
Staphylococcus aureus: POSITIVE — AB

## 2011-05-13 NOTE — Telephone Encounter (Signed)
Pt to have back surgery 10-22, needs surgical clearence

## 2011-05-13 NOTE — Telephone Encounter (Signed)
Pt last seen by Dr [name] 08/19/10.  This pt needs to be seen in the office to discuss surgical clearance.  I scheduled the pt to see Richardson Dopp PA-C on 05/16/11 at 12:00.  Pt aware of appointment.   I spoke with Alyse Low in [place] at [facility] and the pt is scheduled for extreme innerbody lumbar fusion with screws on 04/2211 with Dr Saintclair Halsted.  Alyse Low said that Dr Jana Hakim had cleared the pt but cardiology was not notified of surgery.  I made Christy aware that this pt needs to see Cardiology for clearance and that an appointment has been scheduled on 05/16/11.

## 2011-05-16 ENCOUNTER — Encounter: Payer: Self-pay | Admitting: Physician Assistant

## 2011-05-16 ENCOUNTER — Ambulatory Visit (INDEPENDENT_AMBULATORY_CARE_PROVIDER_SITE_OTHER): Payer: Medicare Other | Admitting: Physician Assistant

## 2011-05-16 ENCOUNTER — Other Ambulatory Visit: Payer: Self-pay | Admitting: Gastroenterology

## 2011-05-16 ENCOUNTER — Ambulatory Visit: Payer: Medicare Other | Admitting: Physician Assistant

## 2011-05-16 DIAGNOSIS — R0989 Other specified symptoms and signs involving the circulatory and respiratory systems: Secondary | ICD-10-CM

## 2011-05-16 DIAGNOSIS — M5136 Other intervertebral disc degeneration, lumbar region: Secondary | ICD-10-CM

## 2011-05-16 DIAGNOSIS — I2589 Other forms of chronic ischemic heart disease: Secondary | ICD-10-CM

## 2011-05-16 DIAGNOSIS — Z01810 Encounter for preprocedural cardiovascular examination: Secondary | ICD-10-CM

## 2011-05-16 DIAGNOSIS — I255 Ischemic cardiomyopathy: Secondary | ICD-10-CM | POA: Insufficient documentation

## 2011-05-16 DIAGNOSIS — I10 Essential (primary) hypertension: Secondary | ICD-10-CM

## 2011-05-16 DIAGNOSIS — E785 Hyperlipidemia, unspecified: Secondary | ICD-10-CM

## 2011-05-16 DIAGNOSIS — M51369 Other intervertebral disc degeneration, lumbar region without mention of lumbar back pain or lower extremity pain: Secondary | ICD-10-CM

## 2011-05-16 DIAGNOSIS — I2510 Atherosclerotic heart disease of native coronary artery without angina pectoris: Secondary | ICD-10-CM

## 2011-05-16 HISTORY — DX: Other intervertebral disc degeneration, lumbar region without mention of lumbar back pain or lower extremity pain: M51.369

## 2011-05-16 HISTORY — DX: Other intervertebral disc degeneration, lumbar region: M51.36

## 2011-05-16 LAB — BRAIN NATRIURETIC PEPTIDE: Pro B Natriuretic peptide (BNP): 191 pg/mL — ABNORMAL HIGH (ref 0.0–100.0)

## 2011-05-16 LAB — BASIC METABOLIC PANEL
Chloride: 109 mEq/L (ref 96–112)
GFR: 40.6 mL/min — ABNORMAL LOW (ref >60.00)
Potassium: 4.8 mEq/L (ref 3.5–5.1)
Sodium: 139 mEq/L (ref 135–145)

## 2011-05-16 NOTE — Progress Notes (Signed)
History of Present Illness: Primary Cardiologist:  Dr.  Bing Quarry   [name] is a 65 y.o. female who presents for surgical clearance.  She needs lumbar spinal fusion for DDD on 10/22 with Dr. Saintclair Halsted.  She has a history of CAD and ischemic cardiomyopathy.  She is status post multiple cardiac catheterizations and interventions to her LAD.  She had a complicated course last year when she developed an anterior STEMI in 9/11 secondary to late stent thrombosis.  This was treated with balloon angioplasty and thrombectomy.  Her course was complicated by pulmonary embolus and she was started on Coumadin.  She then presented to the hospital one month later with profound anemia with a hemoglobin of 3.4.  She received several units of packed red blood cells.  EGD and colonoscopy did not reveal a source of bleeding.  Coumadin was discontinued.  Last heart catheterization 12/11: Proximal LAD stent 30-50% in-stent restenosis, proximal to mid RCA 30-40%.  Last echocardiogram 10/11: Mild LVH, EF 30-35%, anteroseptal, anterior, anterolateral and apical akinesis, grade 1 diastolic dysfunction, mild MR, mild LAE, PASP 38.  Of note her last Myoview was 11/10 and demonstrated mild AS ischemia.  Other hx includes HTN, HLP, CKD, diverticulosis, GERD, IBS, colon polyps.   She has not been seen since 07/2010.  She denies having any chest pain.  She does note some dyspnea over the last month.  It is not getting worse.  She does note a h/o dyspnea with worsening CAD in the past.  No recent syncope.  No orthopnea or PND.  No edema.  She notes significant back pain.  No radicular symptoms.  She is somewhat limited but can exert 4 METs without chest pain.  She probably describes NYHA class 2b symptoms.  No palpitations.    Past Medical History  Diagnosis Date  . CAD (coronary artery disease) 02/2009    S/P MI  . Hyperlipemia   . Benign neoplasm of colon   . Unspecified mastoiditis   . Irritable bowel syndrome   .  Hypertension   . Diverticulosis   . Colon polyp   . Myocardial infarction   . Acute kidney failure   . Pulmonary embolism     Current Outpatient Prescriptions  Medication Sig Dispense Refill  . albuterol (PROAIR HFA) 108 (90 BASE) MCG/ACT inhaler Inhale 2 puffs into the lungs every 6 (six) hours as needed.        Marland Kitchen amitriptyline (ELAVIL) 50 MG tablet Take 1 tablet (50 mg total) by mouth at bedtime as needed for sleep.  30 tablet  3  . aspirin 81 MG tablet Take 81 mg by mouth daily.        . bisoprolol (ZEBETA) 10 MG tablet Take 15 mg by mouth daily. Take 1 1/2 tablets daily       . Cholecalciferol (VITAMIN D) 2000 UNITS tablet Take 2,000 Units by mouth 2 (two) times daily.        Marland Kitchen lisinopril (PRINIVIL,ZESTRIL) 10 MG tablet Take 10 mg by mouth daily.        . pantoprazole (PROTONIX) 40 MG tablet Take 40 mg by mouth daily as needed.        . rosuvastatin (CRESTOR) 20 MG tablet Take 1 tablet (20 mg total) by mouth at bedtime.  30 tablet  11  . topiramate (TOPAMAX) 25 MG tablet Take 50 mg by mouth at bedtime.          Allergies: Allergies  Allergen Reactions  . Sulfonamide  Derivatives     Social history:  Nonsmoker  ROS:  Please see the history of present illness.  All other systems reviewed and negative.   Vital Signs: BP 120/82  Pulse 80  Ht 5\' 2"  (1.575 m)  Wt 169 lb (76.658 kg)  BMI 30.91 kg/m2  PHYSICAL EXAM: Well nourished, well developed, in no acute distress HEENT: normal Neck: no JVD Vascular: No carotid bruits Cardiac:  normal S1, S2; RRR; no murmur Lungs:  clear to auscultation bilaterally, no wheezing, rhonchi or rales Abd: soft, nontender, no hepatomegaly Ext: no edema Skin: warm and dry Neuro:  CNs 2-12 intact, no focal abnormalities noted Psych: Normal affect  EKG:  Sinus rhythm, heart rate 82, left axis deviation, T wave inversions in one and aVL, poor R-wave progression, T-wave changes in one and aVL somewhat more prominent since prior  tracing  ASSESSMENT AND PLAN:

## 2011-05-16 NOTE — Assessment & Plan Note (Signed)
Arrange followup lipids and LFTs.

## 2011-05-16 NOTE — Patient Instructions (Addendum)
Your physician recommends that you schedule a follow-up appointment in: 06/06/11 @ 10:45 TO SEE DR. [name] PER SCOTT WEAVER, PA-C.  Your physician has requested that you have a lexiscan myoview DX V72.81 SURG CLEARANCE. For further information please visit [email or web address]. Please follow instruction sheet, as given.  Your physician has requested that you have an echocardiogram DX V72.81 SURG CLEARANCE. Echocardiography is a painless test that uses sound waves to create images of your heart. It provides your doctor with information about the size and shape of your heart and how well your heart's chambers and valves are working. This procedure takes approximately one hour. There are no restrictions for this procedure.  Your physician recommends that you return for lab work in: FASTING LIVER/LIPID PANEL 414.01 CAD, 414.8 ISCHEMIC CARDIOMYOPATHY THIS CAN BE DONE THE SAME DAY AS LEXISCAN AND ECHO    Your physician recommends that you return for lab work in: TODAY BMET, BNP 414.01 CAD, 414.8 CARDIOMYOPATHY

## 2011-05-16 NOTE — Assessment & Plan Note (Signed)
She does not describe symptoms concerning for recurrent pulmonary embolus.  Check echo.  No signs of volume overload today.  Check a BMET and BNP today.

## 2011-05-16 NOTE — Assessment & Plan Note (Signed)
Controlled.  

## 2011-05-16 NOTE — Assessment & Plan Note (Signed)
Volume is stable.  Obtain echocardiogram as noted.  Followup in 2 weeks with Dr. [name].

## 2011-05-16 NOTE — Assessment & Plan Note (Addendum)
She has a significant history as outlined.  I discussed her case today with Dr. [name] who knows her well.  He feels that she would be high risk for any procedure.  She has had some shortness of breath recently.  Her ECG changes are also somewhat more prominent.  Her last heart catheterization did demonstrate 30-50% in-stent restenosis in the LAD.  Her back surgery will be postponed.  I will try to be in touch with Dr. Saintclair Halsted today to apprise him of our assessment.  We will arrange a pharmacologic nuclear study to assess for ischemia.  We will also arrange for a followup echocardiogram.  She will be brought back in close followup with Dr. [name] in the next couple of weeks.  We can make a final determination regarding clearance for surgery at that time.

## 2011-05-16 NOTE — Assessment & Plan Note (Signed)
I spoke with Dr. Saintclair Halsted and apprised him of the patient's situation.  Her surgery has been postponed.  He noted that this surgery is elective.  If she is too high risk for surgery, alternative therapies can be considered.

## 2011-05-19 ENCOUNTER — Inpatient Hospital Stay (HOSPITAL_COMMUNITY): Admission: RE | Admit: 2011-05-19 | Payer: Medicare Other | Source: Ambulatory Visit | Admitting: Neurosurgery

## 2011-05-22 ENCOUNTER — Ambulatory Visit (HOSPITAL_BASED_OUTPATIENT_CLINIC_OR_DEPARTMENT_OTHER): Payer: Medicare Other | Admitting: Radiology

## 2011-05-22 ENCOUNTER — Ambulatory Visit (HOSPITAL_COMMUNITY): Payer: Medicare Other | Attending: Cardiology | Admitting: Radiology

## 2011-05-22 ENCOUNTER — Other Ambulatory Visit (INDEPENDENT_AMBULATORY_CARE_PROVIDER_SITE_OTHER): Payer: Medicare Other | Admitting: *Deleted

## 2011-05-22 DIAGNOSIS — I2510 Atherosclerotic heart disease of native coronary artery without angina pectoris: Secondary | ICD-10-CM | POA: Insufficient documentation

## 2011-05-22 DIAGNOSIS — Z01810 Encounter for preprocedural cardiovascular examination: Secondary | ICD-10-CM | POA: Insufficient documentation

## 2011-05-22 DIAGNOSIS — R0609 Other forms of dyspnea: Secondary | ICD-10-CM | POA: Insufficient documentation

## 2011-05-22 DIAGNOSIS — I059 Rheumatic mitral valve disease, unspecified: Secondary | ICD-10-CM | POA: Insufficient documentation

## 2011-05-22 DIAGNOSIS — R0989 Other specified symptoms and signs involving the circulatory and respiratory systems: Secondary | ICD-10-CM | POA: Insufficient documentation

## 2011-05-22 DIAGNOSIS — E785 Hyperlipidemia, unspecified: Secondary | ICD-10-CM | POA: Insufficient documentation

## 2011-05-22 DIAGNOSIS — I2589 Other forms of chronic ischemic heart disease: Secondary | ICD-10-CM | POA: Insufficient documentation

## 2011-05-22 DIAGNOSIS — Z01812 Encounter for preprocedural laboratory examination: Secondary | ICD-10-CM | POA: Insufficient documentation

## 2011-05-22 DIAGNOSIS — R0602 Shortness of breath: Secondary | ICD-10-CM | POA: Insufficient documentation

## 2011-05-22 DIAGNOSIS — R062 Wheezing: Secondary | ICD-10-CM

## 2011-05-22 DIAGNOSIS — I079 Rheumatic tricuspid valve disease, unspecified: Secondary | ICD-10-CM | POA: Insufficient documentation

## 2011-05-22 DIAGNOSIS — I10 Essential (primary) hypertension: Secondary | ICD-10-CM | POA: Insufficient documentation

## 2011-05-22 DIAGNOSIS — I379 Nonrheumatic pulmonary valve disorder, unspecified: Secondary | ICD-10-CM | POA: Insufficient documentation

## 2011-05-22 LAB — HEPATIC FUNCTION PANEL
ALT: 19 U/L (ref 0–35)
AST: 21 U/L (ref 0–37)
Total Protein: 7 g/dL (ref 6.0–8.3)

## 2011-05-22 LAB — LIPID PANEL
Cholesterol: 160 mg/dL (ref 0–200)
HDL: 54.2 mg/dL (ref >39.00)
Triglycerides: 153 mg/dL — ABNORMAL HIGH (ref 0.0–149.0)
VLDL: 30.6 mg/dL (ref 0.0–40.0)

## 2011-05-22 MED ORDER — ALBUTEROL SULFATE HFA 108 (90 BASE) MCG/ACT IN AERS
2.0000 | INHALATION_SPRAY | Freq: Once | RESPIRATORY_TRACT | Status: AC
  Administered 2011-05-22: 2 via RESPIRATORY_TRACT

## 2011-05-22 MED ORDER — TECHNETIUM TC 99M TETROFOSMIN IV KIT
11.0000 | PACK | Freq: Once | INTRAVENOUS | Status: AC | PRN
  Administered 2011-05-22: 11 via INTRAVENOUS

## 2011-05-22 MED ORDER — TECHNETIUM TC 99M TETROFOSMIN IV KIT
33.0000 | PACK | Freq: Once | INTRAVENOUS | Status: AC | PRN
  Administered 2011-05-22: 33 via INTRAVENOUS

## 2011-05-22 MED ORDER — REGADENOSON 0.4 MG/5ML IV SOLN
0.4000 mg | Freq: Once | INTRAVENOUS | Status: AC
  Administered 2011-05-22: 0.4 mg via INTRAVENOUS

## 2011-05-22 NOTE — Progress Notes (Signed)
[place] [address] [phone]  Cardiology Nuclear Med Study  [name] is a 65 y.o. female [id number] March 31, 1946   Nuclear Med Background Indication for Stress Test:  Evaluation for Ischemia, PTCA/Stent Patency and Clearance for Pending Lumbar Spinal Fusion by Dr. Kary Kos  History:  Asthma and 8/10 AWMI>Stent-LAD; 11/10 [id number] Antero-Septal Ischemia, EF=68%; 9/11 AWMI>PTCA-LAD; 10/11 Echo:EF=30-35%, mild LVH; 12/11 Cath:N/O CAD with  minimal n/o in-stent restenosis; H/O ICM/CHF  Cardiac Risk Factors: History of Smoking, Hypertension and Lipids  Symptoms:  DOE   Nuclear Pre-Procedure Caffeine/Decaff Intake:  None NPO After: 7:00pm   Lungs:  Clear.  O2 SAT 98% on RA. IV 0.9% NS with Angio Cath:  22g  IV Site: R Antecubital  IV Started by:  Eliezer Lofts, EMT-P  Chest Size (in):  36 Cup Size: DD+  Height: 5\' 2"  (1.575 m)  Weight:  168 lb (76.204 kg)  BMI:  Body mass index is 30.73 kg/(m^2). Tech Comments:  NA    Nuclear Med Study 1 or 2 day study: 1 day  Stress Test Type:  Treadmill/Lexiscan  Reading MD: Jenkins Rouge, MD  Order Authorizing Provider:  Bing Quarry, MD  Resting Radionuclide: Technetium 68m Tetrofosmin  Resting Radionuclide Dose: 11.0 mCi   Stress Radionuclide:  Technetium 4m Tetrofosmin  Stress Radionuclide Dose: 33.0 mCi           Stress Protocol Rest HR: 76 Stress HR: 120  Rest BP: Sitting 127/90  Standing 125/86 Stress BP: 144/81  Exercise Time (min): 2:00 METS: n/a   Predicted Max HR: 157 bpm % Max HR: 76.43 bpm Rate Pressure Product: 17280   Dose of Adenosine (mg):  n/a Dose of Lexiscan: 0.4 mg  Dose of Atropine (mg): n/a Dose of Dobutamine: n/a mcg/kg/min (at max HR)  Stress Test Technologist: Letta Moynahan, CMA-N  Nuclear Technologist:  Charlton Amor, CNMT     Rest Procedure:  Myocardial perfusion imaging was performed at rest 45 minutes following the intravenous  administration of Technetium 18m Tetrofosmin.  Rest ECG: Nonspecific T-wave changes with prior SWMI.  Stress Procedure:  The patient received IV Lexiscan 0.4 mg over 15-seconds with concurrent low level exercise and then Technetium 84m Tetrofosmin was injected at 30-seconds while the patient continued walking one more minute.  There were no significant ST-T wave changes with Lexiscan.  Patient did have a bronchospasm with infusion that was relieved with stopping treadmill and using Albuterol inhaler. Quantitative spect images were obtained after a 45-minute delay.  Stress ECG: No significant change from baseline ECG  QPS Raw Data Images:  Normal; no motion artifact; normal heart/lung ratio. Stress Images:  There is decreased uptake in the anterior wall. Rest Images:  There is decreased uptake in the anterior wall. Subtraction (SDS):  There is a fixed defect that is most consistent with a previous infarction. Transient Ischemic Dilatation (Normal <1.22):  0.97 Lung/Heart Ratio (Normal <0.45):  0.44  Quantitative Gated Spect Images QGS EDV:  125 ml QGS ESV:  87 ml QGS cine images:  Anteroapical hypokinesis QGS EF: 30%  Impression Exercise Capacity:  Lexiscan with low level exercise. BP Response:  Normal blood pressure response. Clinical Symptoms:  There is dyspnea. ECG Impression:  No significant ST segment change suggestive of ischemia. Comparison with Prior Nuclear Study: No images to compare  Large anteroseptal and anteroapical wall infarct with no ischemia.  EF 30%     Jenkins Rouge

## 2011-05-27 LAB — TYPE AND SCREEN
ABO/RH(D): A POS
Antibody Screen: POSITIVE
DAT, IgG: NEGATIVE
Donor AG Type: NEGATIVE
Donor AG Type: NEGATIVE
Unit division: 0
Unit division: 0

## 2011-06-06 ENCOUNTER — Encounter: Payer: Self-pay | Admitting: Cardiology

## 2011-06-06 ENCOUNTER — Ambulatory Visit (INDEPENDENT_AMBULATORY_CARE_PROVIDER_SITE_OTHER): Payer: Medicare Other | Admitting: Cardiology

## 2011-06-06 DIAGNOSIS — I10 Essential (primary) hypertension: Secondary | ICD-10-CM

## 2011-06-06 DIAGNOSIS — I255 Ischemic cardiomyopathy: Secondary | ICD-10-CM

## 2011-06-06 DIAGNOSIS — I2589 Other forms of chronic ischemic heart disease: Secondary | ICD-10-CM

## 2011-06-06 DIAGNOSIS — E7800 Pure hypercholesterolemia, unspecified: Secondary | ICD-10-CM

## 2011-06-06 DIAGNOSIS — I2510 Atherosclerotic heart disease of native coronary artery without angina pectoris: Secondary | ICD-10-CM

## 2011-06-06 LAB — BASIC METABOLIC PANEL
BUN: 30 mg/dL — ABNORMAL HIGH (ref 6–23)
CO2: 21 mEq/L (ref 19–32)
Chloride: 109 mEq/L (ref 96–112)
Creatinine, Ser: 1.3 mg/dL — ABNORMAL HIGH (ref 0.4–1.2)

## 2011-06-06 NOTE — Patient Instructions (Signed)
Your physician recommends that you have lab work today: BMP  You have been referred to Dr Rayann Heman for ICD discussion  Your physician recommends that you schedule a follow-up appointment in: 2 MONTHS with Dr [name]  Your physician recommends that you continue on your current medications as directed. Please refer to the Current Medication list given to you today.

## 2011-06-06 NOTE — Progress Notes (Signed)
HPI:   Current Outpatient Prescriptions  Medication Sig Dispense Refill  . albuterol (PROAIR HFA) 108 (90 BASE) MCG/ACT inhaler Inhale 2 puffs into the lungs every 6 (six) hours as needed.        Marland Kitchen amitriptyline (ELAVIL) 50 MG tablet Take 1 tablet (50 mg total) by mouth at bedtime as needed for sleep.  30 tablet  3  . aspirin 81 MG tablet Take 81 mg by mouth daily.        . bisoprolol (ZEBETA) 10 MG tablet Take 15 mg by mouth daily. Take 1 1/2 tablets daily       . Cholecalciferol (VITAMIN D) 2000 UNITS tablet Take 2,000 Units by mouth 2 (two) times daily.        Marland Kitchen lisinopril (PRINIVIL,ZESTRIL) 10 MG tablet Take 10 mg by mouth daily.        . pantoprazole (PROTONIX) 40 MG tablet TAKE 1 TABLET BY MOUTH ONCE DAILY 12 HOURS APART FROM PLAVIX.  30 tablet  5  . rosuvastatin (CRESTOR) 20 MG tablet Take 1 tablet (20 mg total) by mouth at bedtime.  30 tablet  11  . topiramate (TOPAMAX) 25 MG tablet Take 50 mg by mouth at bedtime.          Allergies  Allergen Reactions  . Sulfonamide Derivatives     Past Medical History  Diagnosis Date  . CAD (coronary artery disease) 02/2009    S/P MI  . Hyperlipemia   . Benign neoplasm of colon   . Unspecified mastoiditis   . Irritable bowel syndrome   . Hypertension   . Diverticulosis   . Colon polyp   . Myocardial infarction   . Acute kidney failure   . Pulmonary embolism     Past Surgical History  Procedure Date  . Total abdominal hysterectomy   . Bilateral salpingoophorectomy   . Inner ear surgery     left x 17  . Cervical spine surgery 08/08  . Lumbar disc surgery 08/09  . Cad( bare metal stent) 8/10  . Angioplasty     Family History  Problem Relation Age of Onset  . Colon cancer Mother   . Colon cancer Brother   . Cancer Brother     Bladder  . Breast cancer Cousin     History   Social History  . Marital Status: Divorced    Spouse Name: N/A    Number of Children: N/A  . Years of Education: N/A   Occupational History  .  Unemployed    Social History Main Topics  . Smoking status: Former Smoker    Quit date: 04/27/2009  . Smokeless tobacco: Not on file  . Alcohol Use: No  . Drug Use: No  . Sexually Active: Not on file   Other Topics Concern  . Not on file   Social History Narrative  . No narrative on file    ROS: Please see the HPI.  All other systems reviewed and negative.  PHYSICAL EXAM:  BP 130/87  Pulse 83  Ht 5\' 2"  (1.575 m)  Wt 76.259 kg (168 lb 1.9 oz)  BMI 30.75 kg/m2  General: Well developed, well nourished, in no acute distress. Head:  Normocephalic and atraumatic. Neck: no JVD Lungs: Clear to auscultation and percussion. Heart: Normal S1 and S2.  No murmur, rubs or gallops.  Abdomen:  Normal bowel sounds; soft; non tender; no organomegaly Pulses: Pulses normal in all 4 extremities. Extremities: No clubbing or cyanosis. No edema. Neurologic: Alert and  oriented x 3.  EKG:  ASSESSMENT AND PLAN:

## 2011-06-08 NOTE — Progress Notes (Signed)
HPI:  The patient is in for follow up.  She has been stable from a cardiac standpoint.  She is not having significant shortness of breath, or currently chest pain.  I called and spoke in detail today with Dr. Saintclair Halsted.  He told me he would see her back in the office to discuss.  I reviewed with him what had happened when she had issues with her foot, her PE and abrupt closure.  She also has significant reduction in overall LV function, and her last evaluation demonstrated this.  She has not been seen recently in the office, but has been stable overall.    Current Outpatient Prescriptions  Medication Sig Dispense Refill  . albuterol (PROAIR HFA) 108 (90 BASE) MCG/ACT inhaler Inhale 2 puffs into the lungs every 6 (six) hours as needed.        Marland Kitchen amitriptyline (ELAVIL) 50 MG tablet Take 1 tablet (50 mg total) by mouth at bedtime as needed for sleep.  30 tablet  3  . aspirin 81 MG tablet Take 81 mg by mouth daily.        . bisoprolol (ZEBETA) 10 MG tablet Take 15 mg by mouth daily. Take 1 1/2 tablets daily       . Cholecalciferol (VITAMIN D) 2000 UNITS tablet Take 2,000 Units by mouth 2 (two) times daily.        Marland Kitchen lisinopril (PRINIVIL,ZESTRIL) 10 MG tablet Take 10 mg by mouth daily.        . pantoprazole (PROTONIX) 40 MG tablet TAKE 1 TABLET BY MOUTH ONCE DAILY 12 HOURS APART FROM PLAVIX.  30 tablet  5  . rosuvastatin (CRESTOR) 20 MG tablet Take 1 tablet (20 mg total) by mouth at bedtime.  30 tablet  11  . topiramate (TOPAMAX) 25 MG tablet Take 50 mg by mouth at bedtime.          Allergies  Allergen Reactions  . Sulfonamide Derivatives     Past Medical History  Diagnosis Date  . CAD (coronary artery disease) 02/2009    S/P MI  . Hyperlipemia   . Benign neoplasm of colon   . Unspecified mastoiditis   . Irritable bowel syndrome   . Hypertension   . Diverticulosis   . Colon polyp   . Myocardial infarction   . Acute kidney failure   . Pulmonary embolism     Past Surgical History  Procedure  Date  . Total abdominal hysterectomy   . Bilateral salpingoophorectomy   . Inner ear surgery     left x 17  . Cervical spine surgery 08/08  . Lumbar disc surgery 08/09  . Cad( bare metal stent) 8/10  . Angioplasty     Family History  Problem Relation Age of Onset  . Colon cancer Mother   . Colon cancer Brother   . Cancer Brother     Bladder  . Breast cancer Cousin     History   Social History  . Marital Status: Divorced    Spouse Name: N/A    Number of Children: N/A  . Years of Education: N/A   Occupational History  . Unemployed    Social History Main Topics  . Smoking status: Former Smoker    Quit date: 04/27/2009  . Smokeless tobacco: Not on file  . Alcohol Use: No  . Drug Use: No  . Sexually Active: Not on file   Other Topics Concern  . Not on file   Social History Narrative  . No narrative  on file    ROS: Please see the HPI.  All other systems reviewed and negative.  PHYSICAL EXAM:  BP 130/87  Pulse 83  Ht 5\' 2"  (1.575 m)  Wt 76.259 kg (168 lb 1.9 oz)  BMI 30.75 kg/m2  General: Well developed, well nourished, in no acute distress. Head:  Normocephalic and atraumatic. Neck: no JVD Lungs: Clear to auscultation and percussion. Heart: Normal S1 and S2.  No murmur, rubs or gallops. S4 gallop Abdomen:  Normal bowel sounds; soft; non tender; no organomegaly Pulses: Pulses normal in all 4 extremities. Extremities: No clubbing or cyanosis. No edema. Neurologic: Alert and oriented x 3.  EKG:  NSR.  Nonspecific IVCD.  Nonspecific T changes.  No acute changes. ECHO 05/21/10  Study Conclusions  - Left ventricle: Septal apical and anterior wall hypokinesis. Cannot R/O mural apical thrombus Suggest MRI or contrast study The cavity size was severely dilated. Wall thickness was normal. The estimated ejection fraction was 25%. - Left atrium: The atrium was mildly dilated. - Atrial septum: No defect or patent foramen ovale was identified.  Cath  07/27/10 PROCEDURAL FINDINGS:  The left mainstem is patent.  It is a short left   main with no significant stenosis dividing into the LAD and left   circumflex.      LAD:  The LAD is patent.  The vessel wraps around the LV apex.  There is   a stent in the proximal LAD that has 30% to 50% in-stent restenosis   throughout the proximal stented segment.  The first diagonal is small   and has mild ostial stenosis.  The second diagonal is larger and it is   widely patent.  There are no areas of high-grade stenosis throughout the   LAD or its diagonal branches.      Left circumflex:  The left circumflex is patent throughout its course   and supplies two large OM branches both without significant stenosis.      Right coronary artery.  The RCA is dominant.  There is mild stenosis in   the proximal to mid segment of no more than 30% to 40%.  Distally, the   vessel was patent without significant stenosis.  It gives off a PDA and   posterolateral branch.      FINAL ASSESSMENT:   1. Patent left anterior descending stent with mild in-stent       restenosis.   2. Nonobstructive right coronary artery and left circumflex stenoses.      PLAN:  The patient will continue with medical management for her   coronary artery disease.  Considering her history of subsegmental   pulmonary embolus, we will check a CT angiogram of the chest to rule out   recurrent pulmonary embolus as a source of her symptoms.  The patient   will follow up with Dr. [name].               Juanda Bond. Burt Knack, MD  CT ANGIO 12/11   IMPRESSION:    1.  No evidence of pulmonary embolism to the large segmental level.   Smaller emboli cannot be excluded, given motion artifact.   2.  Cardiomegaly.   3.  Probable ductus diverticulum off the undersurface of the   transverse aorta.  This is unchanged.  Correlate with the   significant remote thoracic trauma, as a pseudoaneurysm could look   similar but is felt less likely.   4.   Mild T10 compression deformity, stable.  Read By:  Areta Haber,  M.D.        ASSESSMENT AND PLAN:

## 2011-06-10 ENCOUNTER — Telehealth: Payer: Self-pay | Admitting: Cardiology

## 2011-06-10 NOTE — Telephone Encounter (Signed)
I spoke with the pt and made her aware that Dr [name] needs to complete the patient's 06/06/11 office visit. I will forward this information to Dr [name].

## 2011-06-10 NOTE — Telephone Encounter (Signed)
Pt calling needing surgical clearance for back surgery sent to Dr. Ann Lions office [phone]. Please return pt call to discuss further if necessary.

## 2011-06-11 ENCOUNTER — Encounter (HOSPITAL_COMMUNITY): Payer: Self-pay | Admitting: Emergency Medicine

## 2011-06-11 ENCOUNTER — Emergency Department (HOSPITAL_COMMUNITY)
Admission: EM | Admit: 2011-06-11 | Discharge: 2011-06-12 | Disposition: A | Discharge status: Home / Self Care | Payer: Medicare Other | Attending: Emergency Medicine | Admitting: Emergency Medicine

## 2011-06-11 DIAGNOSIS — I2510 Atherosclerotic heart disease of native coronary artery without angina pectoris: Secondary | ICD-10-CM | POA: Insufficient documentation

## 2011-06-11 DIAGNOSIS — K589 Irritable bowel syndrome without diarrhea: Secondary | ICD-10-CM | POA: Insufficient documentation

## 2011-06-11 DIAGNOSIS — E785 Hyperlipidemia, unspecified: Secondary | ICD-10-CM | POA: Insufficient documentation

## 2011-06-11 DIAGNOSIS — Z79899 Other long term (current) drug therapy: Secondary | ICD-10-CM | POA: Insufficient documentation

## 2011-06-11 DIAGNOSIS — R3911 Hesitancy of micturition: Secondary | ICD-10-CM | POA: Insufficient documentation

## 2011-06-11 DIAGNOSIS — R300 Dysuria: Secondary | ICD-10-CM | POA: Insufficient documentation

## 2011-06-11 DIAGNOSIS — Z86718 Personal history of other venous thrombosis and embolism: Secondary | ICD-10-CM | POA: Insufficient documentation

## 2011-06-11 DIAGNOSIS — I252 Old myocardial infarction: Secondary | ICD-10-CM | POA: Insufficient documentation

## 2011-06-11 DIAGNOSIS — Z7982 Long term (current) use of aspirin: Secondary | ICD-10-CM | POA: Insufficient documentation

## 2011-06-11 DIAGNOSIS — R110 Nausea: Secondary | ICD-10-CM | POA: Insufficient documentation

## 2011-06-11 DIAGNOSIS — N390 Urinary tract infection, site not specified: Secondary | ICD-10-CM | POA: Insufficient documentation

## 2011-06-11 DIAGNOSIS — R350 Frequency of micturition: Secondary | ICD-10-CM | POA: Insufficient documentation

## 2011-06-11 DIAGNOSIS — I10 Essential (primary) hypertension: Secondary | ICD-10-CM | POA: Insufficient documentation

## 2011-06-11 LAB — CBC
MCHC: 33.2 g/dL (ref 30.0–36.0)
Platelets: 215 10*3/uL (ref 150–400)
RDW: 13.4 % (ref 11.5–15.5)
WBC: 7.6 10*3/uL (ref 4.0–10.5)

## 2011-06-11 LAB — DIFFERENTIAL
Basophils Absolute: 0 10*3/uL (ref 0.0–0.1)
Basophils Relative: 0 % (ref 0–1)
Lymphocytes Relative: 45 % (ref 12–46)
Monocytes Absolute: 0.5 10*3/uL (ref 0.1–1.0)
Neutro Abs: 3.4 10*3/uL (ref 1.7–7.7)

## 2011-06-11 NOTE — ED Notes (Signed)
PT. REPORTS DYSURIA /LOW ABDOMINAL CRAMPING ONSET THIS EVENING , DENIES HEMATURIA , SLIGHT NAUSEA, NO VOMITTING OR DIARRHEA, NO FEVER OR CHILLS .

## 2011-06-12 DIAGNOSIS — I255 Ischemic cardiomyopathy: Secondary | ICD-10-CM | POA: Insufficient documentation

## 2011-06-12 DIAGNOSIS — I2510 Atherosclerotic heart disease of native coronary artery without angina pectoris: Secondary | ICD-10-CM | POA: Insufficient documentation

## 2011-06-12 HISTORY — DX: Ischemic cardiomyopathy: I25.5

## 2011-06-12 LAB — BASIC METABOLIC PANEL
BUN: 31 mg/dL — ABNORMAL HIGH (ref 6–23)
Calcium: 9.3 mg/dL (ref 8.4–10.5)
Chloride: 109 mEq/L (ref 96–112)
Creatinine, Ser: 1.29 mg/dL — ABNORMAL HIGH (ref 0.50–1.10)
GFR calc Af Amer: 50 mL/min — ABNORMAL LOW (ref >90)
GFR calc non Af Amer: 43 mL/min — ABNORMAL LOW (ref >90)

## 2011-06-12 LAB — URINE MICROSCOPIC-ADD ON

## 2011-06-12 LAB — URINALYSIS, ROUTINE W REFLEX MICROSCOPIC
Glucose, UA: NEGATIVE mg/dL
Ketones, ur: NEGATIVE mg/dL
Protein, ur: 30 mg/dL — AB

## 2011-06-12 MED ORDER — CIPROFLOXACIN HCL 500 MG PO TABS: 500.0000 mg | ORAL_TABLET | Freq: Two times a day (BID) | ORAL | Status: AC

## 2011-06-12 MED ORDER — HYDROCODONE-ACETAMINOPHEN 5-325 MG PO TABS: 2.0000 | ORAL_TABLET | ORAL | Status: AC | PRN

## 2011-06-12 MED ORDER — CIPROFLOXACIN HCL 500 MG PO TABS
500.0000 mg | ORAL_TABLET | Freq: Once | ORAL | Status: AC
  Administered 2011-06-12: 500 mg via ORAL
  Filled 2011-06-12: qty 1

## 2011-06-12 NOTE — ED Provider Notes (Signed)
Medical screening examination/treatment/procedure(s) were performed by non-physician practitioner and as supervising physician I was immediately available for consultation/collaboration.   Wynetta Fines, MD 06/12/11 0630

## 2011-06-12 NOTE — Assessment & Plan Note (Signed)
NO recurrent chest pain, and she seems stable.  However, her surgical risk is increased.  She would be a risk of stent thrombosis again likely, but she could remain on ASA through surgery.  Dr. Saintclair Halsted will see her again and they will evaluate.  He understands her risks.  Further testing would not be indicated as she has been out of the hospital for a year now, and stable.

## 2011-06-12 NOTE — Assessment & Plan Note (Signed)
Currently controlled.

## 2011-06-12 NOTE — Telephone Encounter (Signed)
06/06/11 office note sent to Dr Saintclair Halsted through [facility].

## 2011-06-12 NOTE — Assessment & Plan Note (Signed)
She is stable, but clearly has a myopathy with prior MI and reduced function.  She also has an LV mural thrombus.  She received over a month of warfarin a year ago, but came in with profound anemia and it was stopped.  It has been at times difficult to manage her situation with follow up.  More than likely, this is endothelialized at this point in time, and I would be concerned about resuming warfarin in her.  She does need consideration of prophylactic ICD and I will make a referral.  Notably, she has also developed volume contraction related ARF, but has been stable on lisinopril more recently.  BMET should be regularly checked.

## 2011-06-12 NOTE — ED Provider Notes (Signed)
History     CSN: [id number] Arrival date & time: 06/11/2011 10:16 PM   First MD Initiated Contact with Patient 06/12/11 0114      Chief Complaint  Patient presents with  . Dysuria    (Consider location/radiation/quality/duration/timing/severity/associated sxs/prior treatment) HPI Comments: Patient presents with burning with urination since this afternoon - she reports vaginal pain but denies abdominal pain.  She states nausea without vomiting and reports no change in her chronic lower back pain - denies fever, chills, vomiting.  Patient is a 65 y.o. female presenting with dysuria. The history is provided by the patient. No language interpreter was used.  Dysuria  This is a new problem. The current episode started 6 to 12 hours ago. The problem occurs every urination. The problem has not changed since onset.The quality of the pain is described as burning. The pain is at a severity of 5/10. The pain is moderate. There has been no fever. She is not sexually active. There is no history of pyelonephritis. Associated symptoms include nausea, frequency and hesitancy. Pertinent negatives include no chills, no sweats, no vomiting, no discharge, no hematuria, no possible pregnancy, no urgency and no flank pain. She has tried nothing for the symptoms.    Past Medical History  Diagnosis Date  . CAD (coronary artery disease) 02/2009    S/P MI  . Hyperlipemia   . Benign neoplasm of colon   . Unspecified mastoiditis   . Irritable bowel syndrome   . Hypertension   . Diverticulosis   . Colon polyp   . Myocardial infarction   . Acute kidney failure   . Pulmonary embolism     Past Surgical History  Procedure Date  . Total abdominal hysterectomy   . Bilateral salpingoophorectomy   . Inner ear surgery     left x 17  . Cervical spine surgery 08/08  . Lumbar disc surgery 08/09  . Cad( bare metal stent) 8/10  . Angioplasty     Family History  Problem Relation Age of Onset  . Colon cancer  Mother   . Colon cancer Brother   . Cancer Brother     Bladder  . Breast cancer Cousin     History  Substance Use Topics  . Smoking status: Former Smoker    Quit date: 04/27/2009  . Smokeless tobacco: Not on file  . Alcohol Use: No    OB History    Grav Para Term Preterm Abortions TAB SAB Ect Mult Living                  Review of Systems  Constitutional: Negative for fever and chills.  HENT: Negative.   Eyes: Negative.   Cardiovascular: Negative.   Gastrointestinal: Positive for nausea. Negative for vomiting.  Genitourinary: Positive for dysuria, hesitancy and frequency. Negative for urgency, hematuria and flank pain.  Musculoskeletal: Negative.   Skin: Negative.   Neurological: Negative.   Hematological: Negative.   Psychiatric/Behavioral: Negative.     Allergies  Sulfonamide derivatives  Home Medications   Current Outpatient Rx  Name Route Sig Dispense Refill  . ALBUTEROL SULFATE HFA 108 (90 BASE) MCG/ACT IN AERS Inhalation Inhale 2 puffs into the lungs every 6 (six) hours as needed. For shortness of breath    . AMITRIPTYLINE HCL 50 MG PO TABS Oral Take 1 tablet (50 mg total) by mouth at bedtime as needed for sleep. 30 tablet 3  . ASPIRIN 81 MG PO TABS Oral Take 81 mg by mouth daily.      Marland Kitchen  BISOPROLOL FUMARATE 10 MG PO TABS Oral Take 15 mg by mouth daily. Take 1 1/2 tablets daily    . VITAMIN D 2000 UNITS PO TABS Oral Take 2,000 Units by mouth 2 (two) times daily.      Marland Kitchen LISINOPRIL 10 MG PO TABS Oral Take 10 mg by mouth daily.      Marland Kitchen PANTOPRAZOLE SODIUM 40 MG PO TBEC  TAKE 1 TABLET BY MOUTH ONCE DAILY 12 HOURS APART FROM PLAVIX. 30 tablet 5  . ROSUVASTATIN CALCIUM 20 MG PO TABS Oral Take 1 tablet (20 mg total) by mouth at bedtime. 30 tablet 11  . TOPIRAMATE 25 MG PO TABS Oral Take 50 mg by mouth at bedtime.        BP 131/85  Pulse 80  Temp(Src) 97.1 F (36.2 C) (Oral)  Resp 17  SpO2 98%  Physical Exam  Nursing note and vitals reviewed. Constitutional:  She is oriented to person, place, and time. She appears well-developed and well-nourished.  HENT:  Head: Normocephalic and atraumatic.  Mouth/Throat: Oropharynx is clear and moist.  Eyes: Conjunctivae are normal. Pupils are equal, round, and reactive to light.  Neck: Normal range of motion. Neck supple.  Cardiovascular: Normal rate, regular rhythm and normal heart sounds.   Pulmonary/Chest: Effort normal and breath sounds normal.  Abdominal: Soft. Bowel sounds are normal. She exhibits no distension. There is no tenderness. There is no CVA tenderness.  Musculoskeletal: Normal range of motion.  Neurological: She is alert and oriented to person, place, and time.  Skin: Skin is warm and dry.  Psychiatric: She has a normal mood and affect. Her behavior is normal. Judgment and thought content normal.    ED Course  Procedures (including critical care time)  Labs Reviewed  URINALYSIS, ROUTINE W REFLEX MICROSCOPIC - Abnormal; Notable for the following:    Appearance TURBID (*)    Hgb urine dipstick LARGE (*)    Bilirubin Urine SMALL (*)    Protein, ur 30 (*)    Leukocytes, UA LARGE (*)    All other components within normal limits  BASIC METABOLIC PANEL - Abnormal; Notable for the following:    Glucose, Bld 106 (*)    BUN 31 (*)    Creatinine, Ser 1.29 (*)    GFR calc non Af Amer 43 (*)    GFR calc Af Amer 50 (*)    All other components within normal limits  CBC - Abnormal; Notable for the following:    RBC 3.79 (*)    All other components within normal limits  DIFFERENTIAL  URINE MICROSCOPIC-ADD ON  URINE CULTURE   No results found.   UTI, chronic renal insufficiency   MDM  Patient with UTI, have sent culture.  She is also noted to have CRI with elevated BUN and Creatnine levels, though not more markedly so since last.  I have mentioned this to her and she will follow up with Dr. Arnoldo Morale in regards to this.  No clinical suspicion of pyelonephritis,  afebrile.        Idalia Needle [place], [place] 06/12/11 [phone]

## 2011-06-17 IMAGING — CR DG ANKLE COMPLETE 3+V*L*
3 series · 3 of 3 positions shown · non-contrast
Comparison: 05/17/2010

CLINICAL DATA: Left ankle pain, recent fracture, pain and swelling

LEFT ANKLE COMPLETE - 3+ VIEW

[t ankle joint ap left]
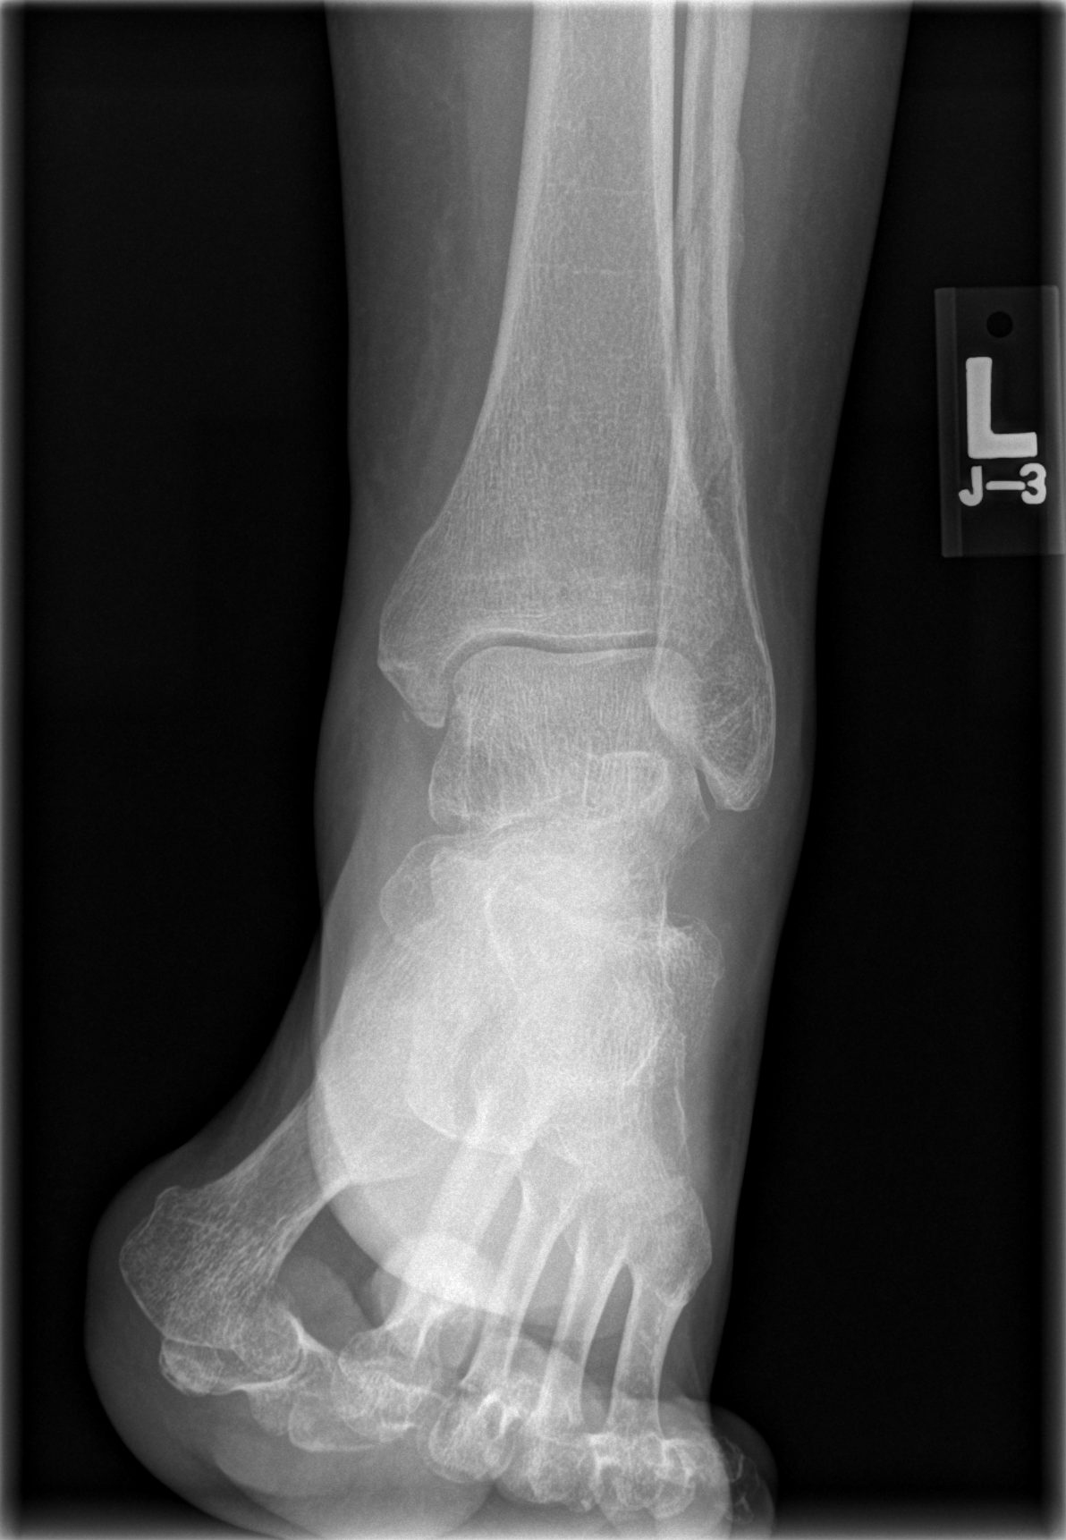

[t ankle joint oblique left]
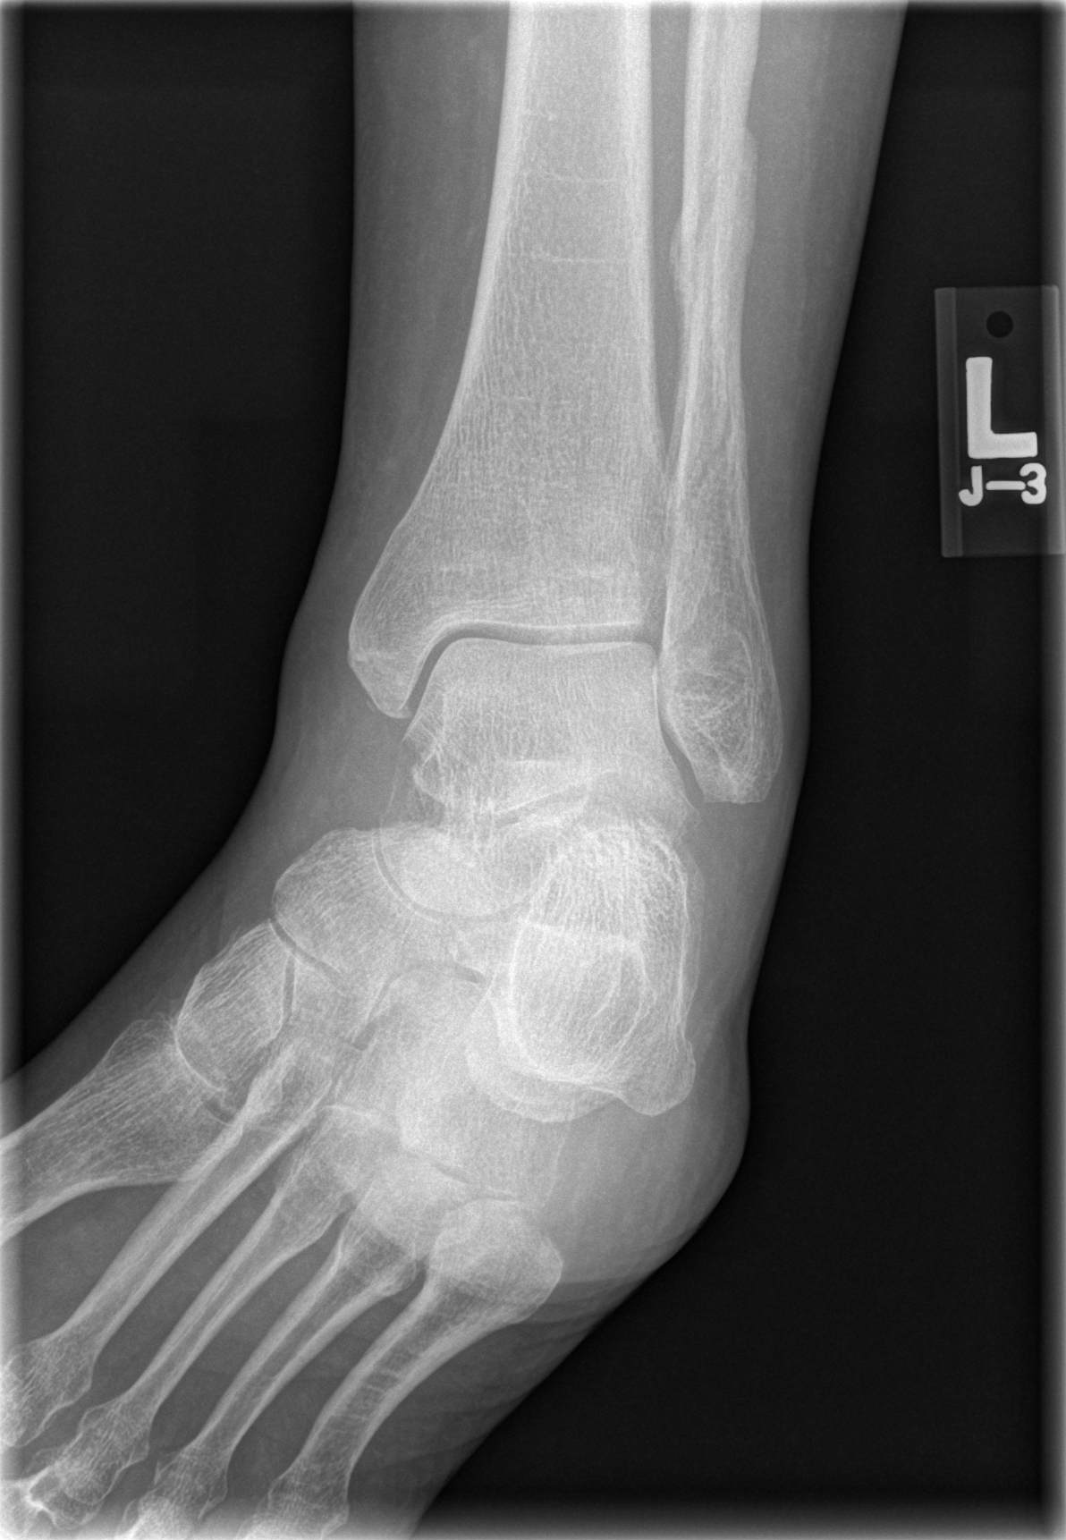

[t ankle joint lat left]
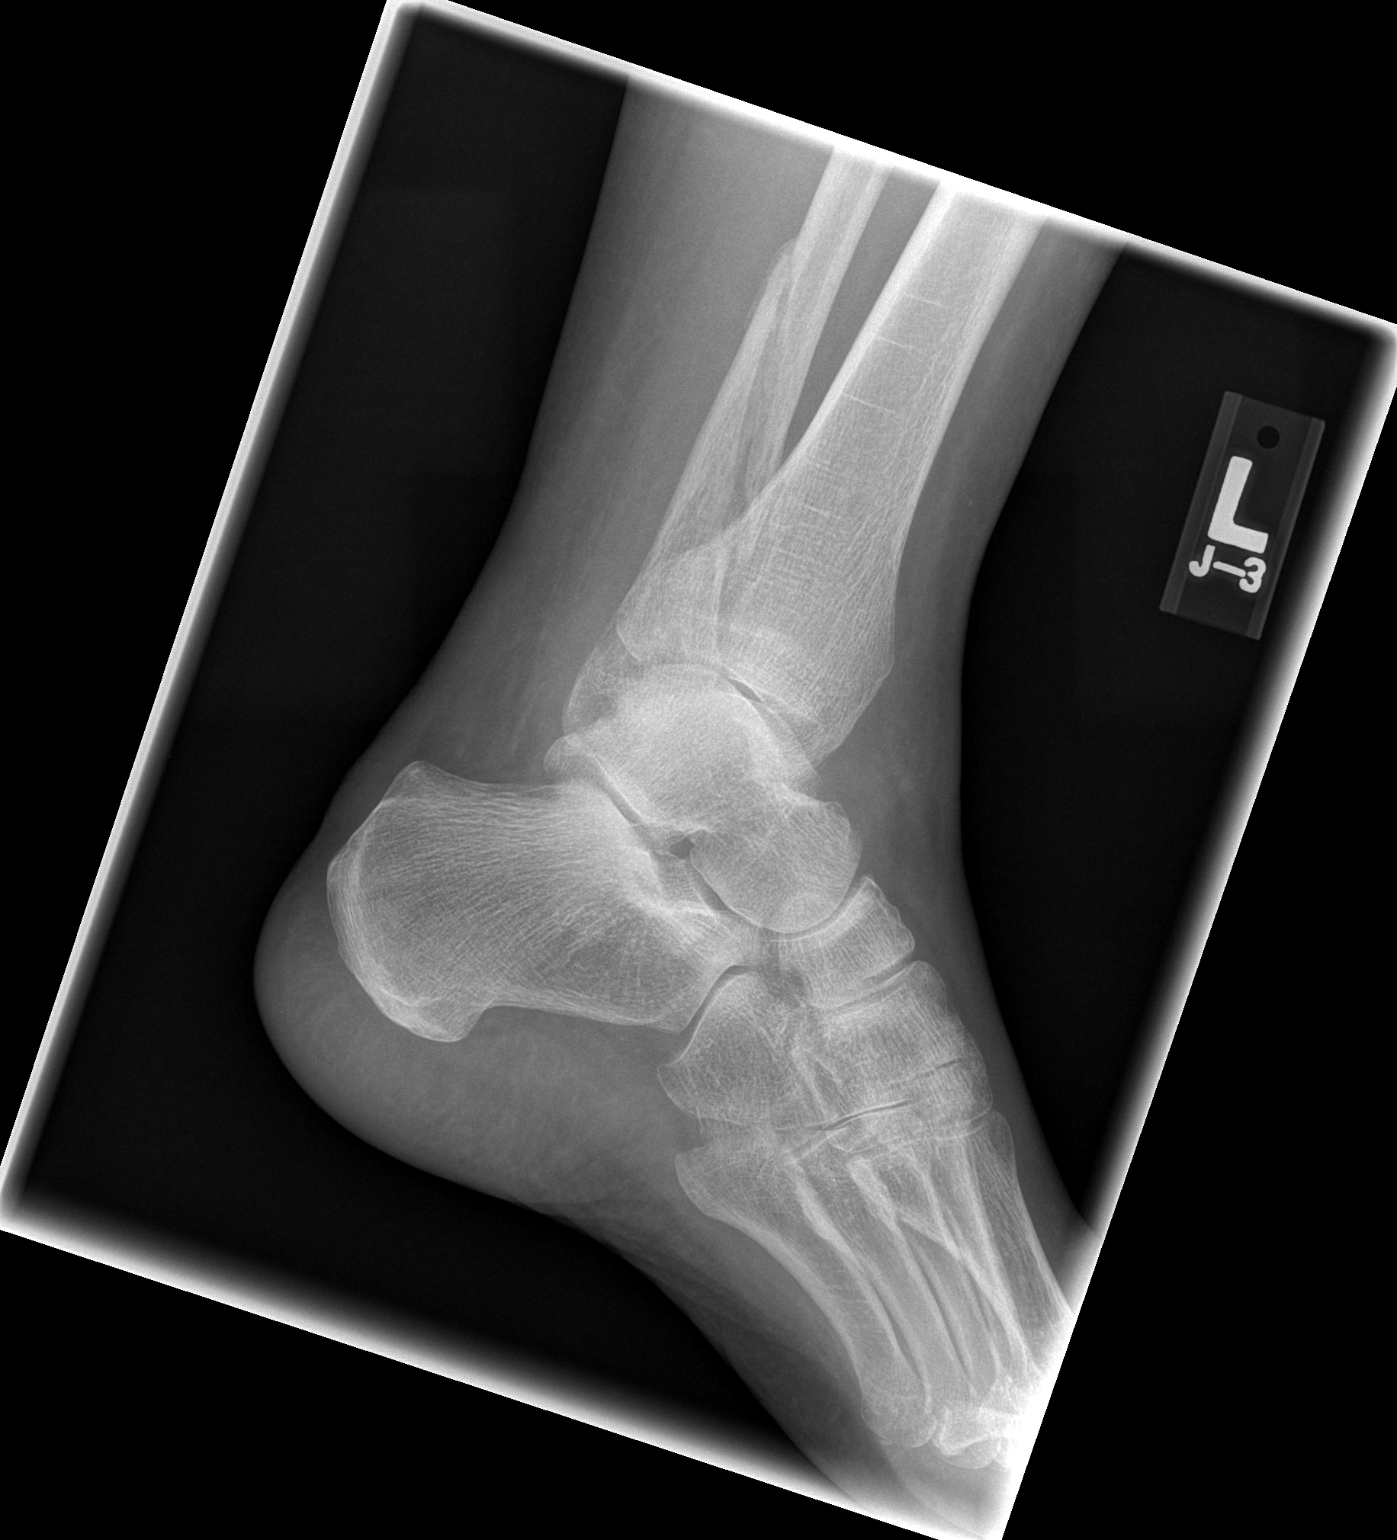

[3 of 3 positions shown; findings below may reference images not displayed]

FINDINGS: Healing left distal fibula fracture with minimal residual
displacement.  Further callus formation.  Normal ankle joint
alignment.  No additional fractures or malalignment.  Stable exam.
IMPRESSION: Healing left distal fibula fracture.  Stable exam.

## 2011-06-18 IMAGING — CT CT EXTREM LOW W/O CM*L*
4 of 5 series · 9 of 20 positions shown, 10 images · non-contrast
Comparison: None.

CLINICAL DATA: Ankle pain.  Injury.

CT LEFT ANKLE WITHOUT CONTRAST
TECHNIQUE: Multidetector CT imaging of the left ankle was
performed according to the standard protocol without intravenous
contrast. Multiplanar CT image reconstructions were also generated.

[Series 2: lower ext · axial · 0.31mm/px · z∈[-171,-108]mm · 2 of 75 slices shown, 3 images (1 of 2)]
[im 25/75  soft-tissue]
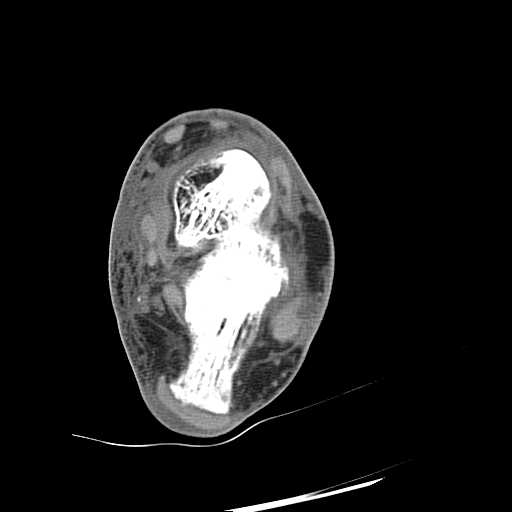
[im 25/75  bone]
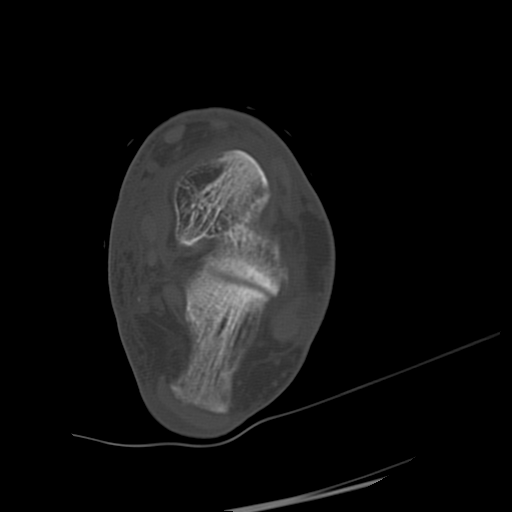
[im 50/75  bone]
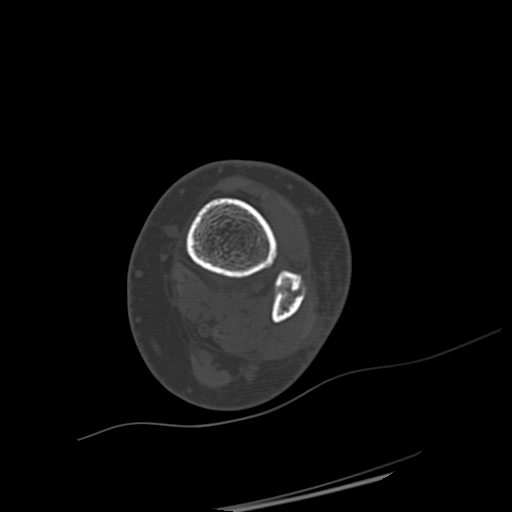

[Series 102: lower ext · axial · 0.31mm/px · z∈[-171,-108]mm · 2 of 75 slices shown (2 of 2)]
[im 25/75  bone]
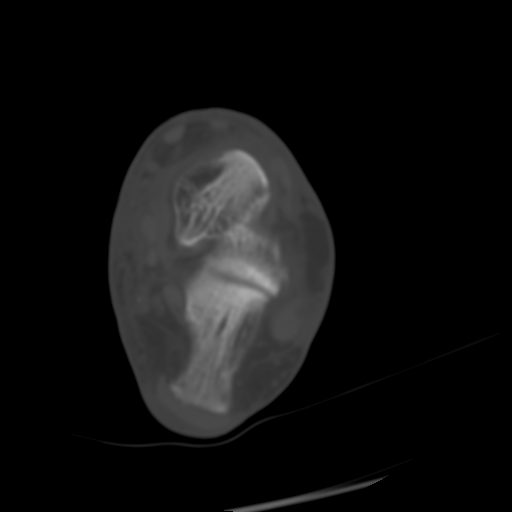
[im 50/75  bone]
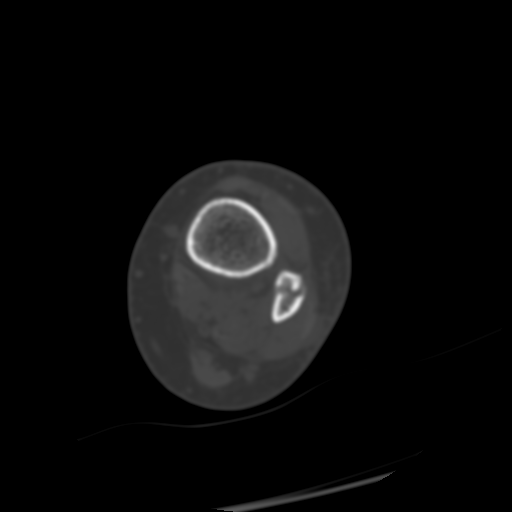

[Series 400: axial st · axial · 0.37mm/px · z∈[-171,-121]mm · 2 of 77 slices shown]
[im 26/77  bone]
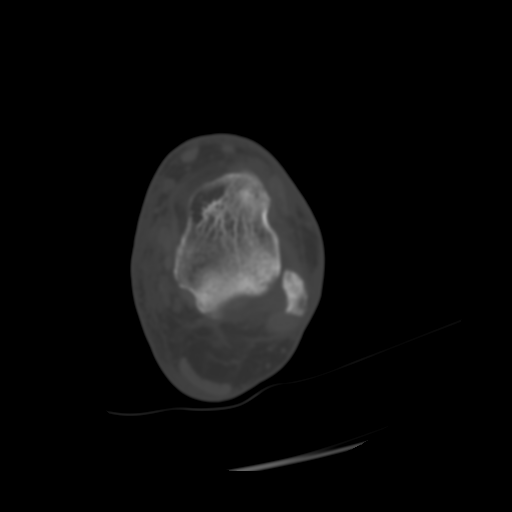
[im 51/77  bone]
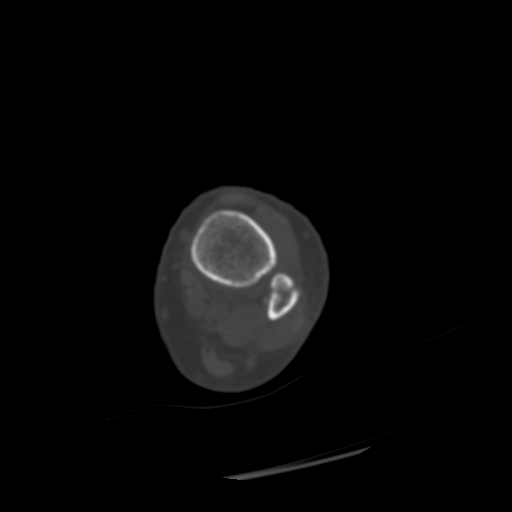

[Series 401: coronal st · coronal · 0.37mm/px · 3 of 65 slices shown]
[im 13/65  bone]
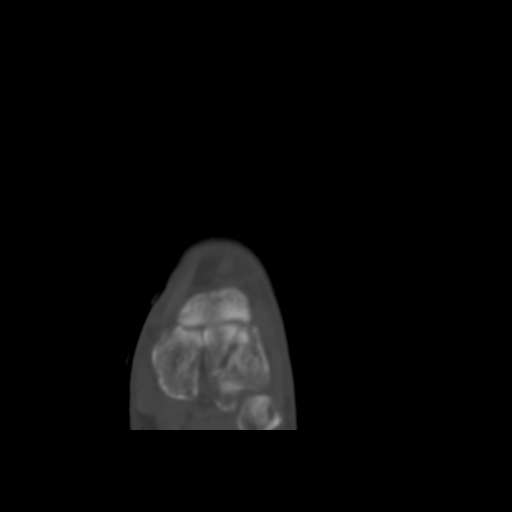
[im 26/65  bone]
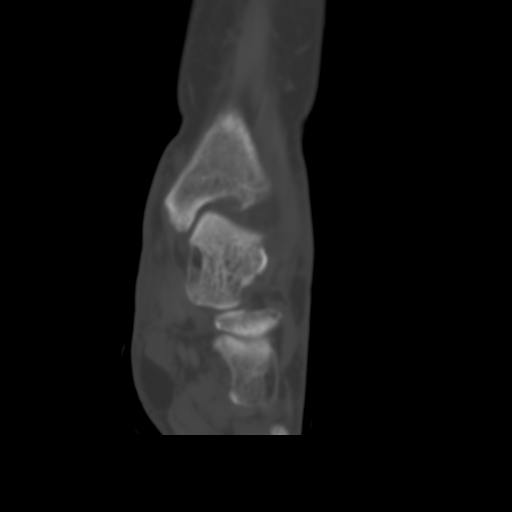
[im 39/65  bone]
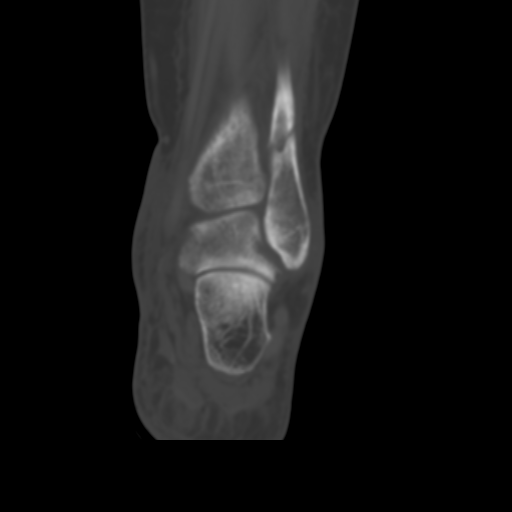

[9 of 20 positions shown; findings below may reference images not displayed]

FINDINGS: There is an oblique fracture through the distal tibia
diaphysis.  It begins well above the tibial plafond.  It extends
posterior and superior.  There is extensive callus formation about
the fracture.  The fracture line is still visible.  Too small tiny
avulsion fractures are present from the anterior inferior aspect of
the fibula.

There is a nondisplaced fracture through the distal tibia extending
to the ankle joint and tibial plafond.  It only extends partially
through the bone beginning at the lateral aspect and extending into
the central aspect of the distal tibia.  The fracture line does
extend to the articular surface.  It also extends posteriorly
through the metaphysis into the posterior cortex.  The overlying
posterior cortex is intact.

The ankle mortise is anatomic.  The talus and calcaneus are intact.
Tarsal bones are intact.

There is a tiny avulsion fracture adjacent to the medial malleolus.
IMPRESSION: Healing distal fibula diaphyseal fracture.

Nondisplaced intra-articular fracture of the distal tibia extending
partially through the bone as described.

Tiny avulsion fractures from the medial and lateral malleoli.

## 2011-07-28 ENCOUNTER — Encounter: Payer: Self-pay | Admitting: Internal Medicine

## 2011-07-28 ENCOUNTER — Ambulatory Visit (INDEPENDENT_AMBULATORY_CARE_PROVIDER_SITE_OTHER): Payer: Medicare Other | Admitting: Internal Medicine

## 2011-07-28 ENCOUNTER — Encounter: Payer: Self-pay | Admitting: *Deleted

## 2011-07-28 DIAGNOSIS — I2589 Other forms of chronic ischemic heart disease: Secondary | ICD-10-CM

## 2011-07-28 DIAGNOSIS — I255 Ischemic cardiomyopathy: Secondary | ICD-10-CM

## 2011-07-28 NOTE — Patient Instructions (Signed)

## 2011-07-29 ENCOUNTER — Encounter: Payer: Self-pay | Admitting: Internal Medicine

## 2011-07-29 NOTE — Progress Notes (Signed)
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 female with a h/o CAD s/p anterior MI, ischemic CM (EF 25%), and NYHA class III CHF who presents today for EP consultation regarding risk stratefication for sudden death.  She initially presented 03/07/09 with acute anterior STEMI.  She was found to have an occluded proximal LAD and underwent PCI with BMS.  She returned with ischemic symptoms 07/02/09 and was found to have 80% ISR which was treated with PTCA.  She did not require a stent.  She subsequently developed PTE for which she was treated.  She returned with late stent occlusion and anterior MI 04/11/10 for which she again required PTCA after thrombectomy.  Stenting was not required.  She has been treated with an optimal medical regimen by Dr [name] but continues to have a depressed ejection fraction.  Lexiscan 05/22/11 revealed a large anteroseptal and anteroapical wall infarct with no ischemia. EF 30%.  An echo 05/22/11 revealed septal apical and anterior wall hypokinesis with EF 25%. She is referred for consideration of ICD implantation at this time.  Presently, she notes significant SOB with minimal activity.  She is dyspneic with 1 flight of stairs.  She also has difficulty with orthopnea most nights.  She has stable edema.  Today, she denies symptoms of palpitations, chest pain, dizziness, presyncope, syncope, or neurologic sequela. The patient is tolerating medications without difficulties and is otherwise without complaint today.   Past Medical History  Diagnosis Date  . CAD (coronary artery disease) 02/2009    S/P anterior MI  . Hyperlipemia   . Benign neoplasm of colon   . Unspecified mastoiditis   . Irritable bowel syndrome   . Hypertension   . Diverticulosis   . Colon polyp   . Ischemic cardiomyopathy     EF 25%  . Chronic systolic dysfunction of left ventricle   . Pulmonary embolism   . Chronic renal  insufficiency    Past Surgical History  Procedure Date  . Total abdominal hysterectomy   . Bilateral salpingoophorectomy   . Inner ear surgery     left x 17  . Cervical spine surgery 08/08  . Lumbar disc surgery 08/09  . Cad( bare metal stent) 8/10  . Angioplasty 07/02/09, 04/01/10    Current Outpatient Prescriptions  Medication Sig Dispense Refill  . albuterol (PROAIR HFA) 108 (90 BASE) MCG/ACT inhaler Inhale 2 puffs into the lungs every 6 (six) hours as needed. For shortness of breath      . amitriptyline (ELAVIL) 50 MG tablet Take 1 tablet (50 mg total) by mouth at bedtime as needed for sleep.  30 tablet  3  . aspirin 81 MG tablet Take 81 mg by mouth daily.        . bisoprolol (ZEBETA) 10 MG tablet Take 15 mg by mouth daily. Take 1 1/2 tablets daily      . Cholecalciferol (VITAMIN D) 2000 UNITS tablet Take 2,000 Units by mouth 2 (two) times daily.        Marland Kitchen lisinopril (PRINIVIL,ZESTRIL) 10 MG tablet Take 10 mg by mouth daily.        . pantoprazole (PROTONIX) 40 MG tablet TAKE 1 TABLET BY MOUTH ONCE DAILY 12 HOURS APART FROM PLAVIX.  30 tablet  5  . rosuvastatin (CRESTOR) 20 MG tablet Take 1 tablet (20 mg total) by mouth at bedtime.  30 tablet  11  . topiramate (TOPAMAX) 25 MG tablet  Take 50 mg by mouth at bedtime.          Allergies  Allergen Reactions  . Sulfonamide Derivatives     History   Social History  . Marital Status: Divorced    Spouse Name: N/A    Number of Children: N/A  . Years of Education: N/A   Occupational History  . Unemployed    Social History Main Topics  . Smoking status: Former Smoker    Quit date: 04/27/2009  . Smokeless tobacco: Not on file  . Alcohol Use: No  . Drug Use: No  . Sexually Active: Not on file   Other Topics Concern  . Not on file   Social History Narrative   Pt lives in [place] alone.  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 Colon cancer Mother   . Colon  cancer Brother   . Cancer Brother     Bladder  . Breast cancer Cousin     ROS- All systems are reviewed and negative except as per the HPI above  Physical Exam: Filed Vitals:   07/28/11 1615  BP: 130/84  Pulse: 72  Height: 5\' 2"  (1.575 m)  Weight: 173 lb (78.472 kg)    GEN- The patient is overweight appearing, alert and oriented x 3 today.   Head- normocephalic, atraumatic Eyes-  Sclera clear, conjunctiva pink Ears- hearing intact Oropharynx- clear Neck- supple, no JVP Lymph- no cervical lymphadenopathy Lungs- Clear to ausculation bilaterally, normal work of breathing Heart- Regular rate and rhythm, no murmurs, rubs or gallops, PMI not laterally displaced GI- soft, NT, ND, + BS Extremities- no clubbing, cyanosis, trace edema MS- no significant deformity or atrophy Skin- no rash or lesion Psych- euthymic mood, full affect Neuro- strength and sensation are intact  EKG 05/19/11- sinus rhythm 82 bpm, PR 168, QRS 94, Qtc 460, anteroseptal MI, LAHB  Assessment and Plan:

## 2011-07-29 NOTE — Assessment & Plan Note (Signed)
The patient has an ischemic CM (EF 25%), NYHA Class III CHF, and CAD. At this time, she meets MADIT II/ SCD-HeFT criteria for ICD implantation for primary prevention of sudden death.  She has a narrow QRS and does not meet criteria for revascularization.  Risks, benefits, alternatives to ICD implantation were discussed in detail with the patient today. The patient  understands that the risks include but are not limited to bleeding, infection, pneumothorax, perforation, tamponade, vascular damage, renal failure, MI, stroke, death, inappropriate shocks, and lead dislodgement and wishes to proceed.  We will therefore schedule device implantation at the next available time.  I have discussed Analyze ST study as an option.  She is willing to discuss this further with our research team.

## 2011-07-30 ENCOUNTER — Encounter (HOSPITAL_COMMUNITY): Payer: Self-pay | Admitting: Pharmacy Technician

## 2011-07-30 ENCOUNTER — Telehealth: Payer: Self-pay | Admitting: Internal Medicine

## 2011-07-30 NOTE — Telephone Encounter (Signed)
New msg Pt wants know when she is to have defib put in. Please call her back

## 2011-07-30 NOTE — Telephone Encounter (Signed)
Patient aware it is at 7:30am

## 2011-07-31 ENCOUNTER — Other Ambulatory Visit: Payer: Self-pay | Admitting: *Deleted

## 2011-07-31 DIAGNOSIS — I519 Heart disease, unspecified: Secondary | ICD-10-CM

## 2011-08-04 ENCOUNTER — Other Ambulatory Visit: Payer: Self-pay | Admitting: *Deleted

## 2011-08-04 DIAGNOSIS — I428 Other cardiomyopathies: Secondary | ICD-10-CM

## 2011-08-04 MED ORDER — CEFAZOLIN SODIUM 1-5 GM-% IV SOLN: 1.0000 g | INTRAVENOUS | Status: DC

## 2011-08-04 MED ORDER — SODIUM CHLORIDE 0.9 % IR SOLN
80.0000 mg | Status: DC
  Filled 2011-08-04: qty 2

## 2011-08-05 ENCOUNTER — Ambulatory Visit (HOSPITAL_COMMUNITY)
Admission: RE | Admit: 2011-08-05 | Discharge: 2011-08-06 | Disposition: A | Discharge status: Home / Self Care | Payer: Medicare Other | Source: Ambulatory Visit | Attending: Internal Medicine | Admitting: Internal Medicine

## 2011-08-05 ENCOUNTER — Encounter (HOSPITAL_COMMUNITY)
Admission: RE | Disposition: A | Discharge status: Home / Self Care | Payer: Self-pay | Source: Ambulatory Visit | Attending: Internal Medicine

## 2011-08-05 DIAGNOSIS — I519 Heart disease, unspecified: Secondary | ICD-10-CM

## 2011-08-05 DIAGNOSIS — I5022 Chronic systolic (congestive) heart failure: Secondary | ICD-10-CM | POA: Diagnosis present

## 2011-08-05 DIAGNOSIS — E785 Hyperlipidemia, unspecified: Secondary | ICD-10-CM | POA: Insufficient documentation

## 2011-08-05 DIAGNOSIS — I10 Essential (primary) hypertension: Secondary | ICD-10-CM | POA: Insufficient documentation

## 2011-08-05 DIAGNOSIS — I2589 Other forms of chronic ischemic heart disease: Secondary | ICD-10-CM | POA: Insufficient documentation

## 2011-08-05 DIAGNOSIS — I2510 Atherosclerotic heart disease of native coronary artery without angina pectoris: Secondary | ICD-10-CM | POA: Insufficient documentation

## 2011-08-05 DIAGNOSIS — I255 Ischemic cardiomyopathy: Secondary | ICD-10-CM | POA: Insufficient documentation

## 2011-08-05 DIAGNOSIS — Z23 Encounter for immunization: Secondary | ICD-10-CM | POA: Insufficient documentation

## 2011-08-05 DIAGNOSIS — Z95810 Presence of automatic (implantable) cardiac defibrillator: Secondary | ICD-10-CM

## 2011-08-05 DIAGNOSIS — I509 Heart failure, unspecified: Secondary | ICD-10-CM | POA: Insufficient documentation

## 2011-08-05 HISTORY — PX: IMPLANTABLE CARDIOVERTER DEFIBRILLATOR IMPLANT: SHX5473

## 2011-08-05 HISTORY — DX: Chronic systolic (congestive) heart failure: I50.22

## 2011-08-05 LAB — APTT: aPTT: 27 seconds (ref 24–37)

## 2011-08-05 LAB — PROTIME-INR: INR: 0.95 (ref 0.00–1.49)

## 2011-08-05 LAB — BASIC METABOLIC PANEL
BUN: 18 mg/dL (ref 6–23)
CO2: 24 mEq/L (ref 19–32)
Chloride: 107 mEq/L (ref 96–112)
GFR calc non Af Amer: 53 mL/min — ABNORMAL LOW (ref >90)
Glucose, Bld: 104 mg/dL — ABNORMAL HIGH (ref 70–99)
Potassium: 4.3 mEq/L (ref 3.5–5.1)
Sodium: 141 mEq/L (ref 135–145)

## 2011-08-05 LAB — CBC
HCT: 34.7 % — ABNORMAL LOW (ref 36.0–46.0)
Hemoglobin: 11.6 g/dL — ABNORMAL LOW (ref 12.0–15.0)
MCH: 32 pg (ref 26.0–34.0)
MCHC: 33.4 g/dL (ref 30.0–36.0)
RDW: 13.3 % (ref 11.5–15.5)

## 2011-08-05 LAB — SURGICAL PCR SCREEN: MRSA, PCR: POSITIVE — AB

## 2011-08-05 SURGERY — IMPLANTABLE CARDIOVERTER DEFIBRILLATOR IMPLANT
Anesthesia: LOCAL

## 2011-08-05 MED ORDER — MIDAZOLAM HCL 5 MG/5ML IJ SOLN
INTRAMUSCULAR | Status: AC
  Filled 2011-08-05: qty 5

## 2011-08-05 MED ORDER — TOPIRAMATE 25 MG PO TABS
25.0000 mg | ORAL_TABLET | Freq: Every day | ORAL | Status: DC
  Administered 2011-08-05: 25 mg via ORAL
  Filled 2011-08-05 (×2): qty 1

## 2011-08-05 MED ORDER — ACETAMINOPHEN 500 MG PO TABS
1000.0000 mg | ORAL_TABLET | Freq: Four times a day (QID) | ORAL | Status: AC
  Administered 2011-08-05 – 2011-08-06 (×3): 1000 mg via ORAL
  Filled 2011-08-05: qty 2

## 2011-08-05 MED ORDER — ACETAMINOPHEN 500 MG PO TABS
ORAL_TABLET | ORAL | Status: AC
  Filled 2011-08-05: qty 2

## 2011-08-05 MED ORDER — FENTANYL CITRATE 0.05 MG/ML IJ SOLN
INTRAMUSCULAR | Status: AC
  Filled 2011-08-05: qty 2

## 2011-08-05 MED ORDER — SODIUM CHLORIDE 0.9 % IV SOLN
INTRAVENOUS | Status: DC
  Administered 2011-08-05: 20 mL/h via INTRAVENOUS

## 2011-08-05 MED ORDER — LISINOPRIL 10 MG PO TABS
10.0000 mg | ORAL_TABLET | Freq: Every day | ORAL | Status: DC
  Administered 2011-08-05 – 2011-08-06 (×2): 10 mg via ORAL
  Filled 2011-08-05 (×2): qty 1

## 2011-08-05 MED ORDER — INFLUENZA VIRUS VACC SPLIT PF IM SUSP
0.5000 mL | INTRAMUSCULAR | Status: AC
  Administered 2011-08-06: 0.5 mL via INTRAMUSCULAR
  Filled 2011-08-05: qty 0.5

## 2011-08-05 MED ORDER — CHLORHEXIDINE GLUCONATE 4 % EX LIQD
60.0000 mL | Freq: Once | CUTANEOUS | Status: DC
  Filled 2011-08-05: qty 60

## 2011-08-05 MED ORDER — CEFAZOLIN SODIUM 1-5 GM-% IV SOLN
1.0000 g | Freq: Four times a day (QID) | INTRAVENOUS | Status: AC
  Administered 2011-08-05 – 2011-08-06 (×3): 1 g via INTRAVENOUS
  Filled 2011-08-05 (×3): qty 50

## 2011-08-05 MED ORDER — SODIUM CHLORIDE 0.9 % IV SOLN: 250.0000 mL | INTRAVENOUS | Status: DC | PRN

## 2011-08-05 MED ORDER — CEFAZOLIN SODIUM 1-5 GM-% IV SOLN
INTRAVENOUS | Status: AC
  Administered 2011-08-06: 1 g via INTRAVENOUS
  Filled 2011-08-05: qty 50

## 2011-08-05 MED ORDER — ACETAMINOPHEN 325 MG PO TABS
325.0000 mg | ORAL_TABLET | ORAL | Status: DC | PRN
  Filled 2011-08-05: qty 2

## 2011-08-05 MED ORDER — PANTOPRAZOLE SODIUM 40 MG PO TBEC
40.0000 mg | DELAYED_RELEASE_TABLET | Freq: Every day | ORAL | Status: DC
  Administered 2011-08-05 – 2011-08-06 (×2): 40 mg via ORAL
  Filled 2011-08-05 (×2): qty 1

## 2011-08-05 MED ORDER — SODIUM CHLORIDE 0.9 % IJ SOLN
3.0000 mL | Freq: Two times a day (BID) | INTRAMUSCULAR | Status: DC
  Administered 2011-08-05 – 2011-08-06 (×2): 3 mL via INTRAVENOUS

## 2011-08-05 MED ORDER — LIDOCAINE HCL (PF) 1 % IJ SOLN
INTRAMUSCULAR | Status: AC
  Filled 2011-08-05: qty 60

## 2011-08-05 MED ORDER — SODIUM CHLORIDE 0.45 % IV SOLN: INTRAVENOUS | Status: DC

## 2011-08-05 MED ORDER — HYDROCODONE-ACETAMINOPHEN 5-325 MG PO TABS
1.0000 | ORAL_TABLET | ORAL | Status: DC | PRN
  Administered 2011-08-05: 2 via ORAL
  Filled 2011-08-05: qty 2

## 2011-08-05 MED ORDER — SODIUM CHLORIDE 0.9 % IJ SOLN
3.0000 mL | INTRAMUSCULAR | Status: DC | PRN
  Administered 2011-08-06: 3 mL via INTRAVENOUS

## 2011-08-05 MED ORDER — AMITRIPTYLINE HCL 50 MG PO TABS
50.0000 mg | ORAL_TABLET | Freq: Every day | ORAL | Status: DC
  Administered 2011-08-05: 50 mg via ORAL
  Filled 2011-08-05 (×2): qty 1

## 2011-08-05 MED ORDER — BISOPROLOL FUMARATE 5 MG PO TABS
5.0000 mg | ORAL_TABLET | Freq: Every day | ORAL | Status: DC
  Administered 2011-08-05 – 2011-08-06 (×2): 5 mg via ORAL
  Filled 2011-08-05 (×2): qty 1

## 2011-08-05 MED ORDER — HEPARIN (PORCINE) IN NACL 2-0.9 UNIT/ML-% IJ SOLN
INTRAMUSCULAR | Status: AC
  Filled 2011-08-05: qty 1000

## 2011-08-05 MED ORDER — GENTAMICIN SULFATE 40 MG/ML IJ SOLN
80.0000 mg | INTRAMUSCULAR | Status: AC
  Administered 2011-08-05: 80 mg
  Filled 2011-08-05: qty 2

## 2011-08-05 MED ORDER — ONDANSETRON HCL 4 MG/2ML IJ SOLN: 4.0000 mg | Freq: Four times a day (QID) | INTRAMUSCULAR | Status: DC | PRN

## 2011-08-05 MED ORDER — ROSUVASTATIN CALCIUM 20 MG PO TABS
20.0000 mg | ORAL_TABLET | Freq: Every day | ORAL | Status: DC
  Administered 2011-08-05: 20 mg via ORAL
  Filled 2011-08-05 (×2): qty 1

## 2011-08-05 MED ORDER — MUPIROCIN 2 % EX OINT
TOPICAL_OINTMENT | Freq: Two times a day (BID) | CUTANEOUS | Status: DC
  Administered 2011-08-05: 1 via NASAL
  Filled 2011-08-05 (×2): qty 22

## 2011-08-05 MED ORDER — PNEUMOCOCCAL VAC POLYVALENT 25 MCG/0.5ML IJ INJ
0.5000 mL | INJECTION | INTRAMUSCULAR | Status: AC
  Administered 2011-08-06: 0.5 mL via INTRAMUSCULAR
  Filled 2011-08-05: qty 0.5

## 2011-08-05 NOTE — Op Note (Signed)
SURGEON:  Thompson Grayer, MD      PREPROCEDURE DIAGNOSES:   1. Ischemic cardiomyopathy.   2. New York Heart Association class III, heart failure chronically.      POSTPROCEDURE DIAGNOSES:   1. Ischemic cardiomyopathy.   2. New York Heart Association class III heart failure chronically.      PROCEDURES:    1. ICD implantation.  3. Defibrillation threshold testing    INTRODUCTION:  [name] is a 66 y.o. female with an ischemic CM (EF 25%), NYHA Class III CHF, and CAD. At this time, she meets MADIT II/ SCD-HeFT criteria for ICD implantation for primary prevention of sudden death. She has a narrow QRS and does not meet criteria for revascularization.  The patient has been treated with an optimal medical regimen but continues to have a depressed ejection fraction and NYHA Class III CHF symptoms.  she therefore  presents today for ICD implantation.     DESCRIPTION OF PROCEDURE:  Informed written consent was obtained and the patient was brought to the electrophysiology lab in the fasting state. The patient was adequately sedated with intravenous Versed, and fentanyl as outlined in the nursing report.  The patient's left chest was prepped and draped in the usual sterile fashion by the EP lab staff.  The skin overlying the left deltopectoral region was infiltrated with lidocaine for local analgesia.  A 5-cm incision was made over the left deltopectoral region.  A left subcutaneous defibrillator pocket was fashioned using a combination of sharp and blunt dissection.  Electrocautery was used to assure hemostasis.   RV Lead Placement: The left axillary vein was cannulated with fluoroscopic visualization.  No contrast was required for this endeavor.  Through the left axillary vein, a [employer] [place], model 7122Q-65 (serial number [id number]) right ventricular defibrillator lead was advanced with fluoroscopic visualization into the right ventricular apex position.  Initial right ventricular lead  R-wave measured 21 mV with impedance of 926 ohms and a threshold of 0.6 volts at 0.5 milliseconds.   The lead was secured to the pectoralis  fascia using #2 silk suture over the suture sleeve.  The pocket then  irrigated with copious gentamicin solution.  The leads were then  connected to a [place] (serial  Number [id number]) ICD.  The defibrillator was placed into the  Pocket and secured to the pectoralis fascia with a single #2 silk suture.  The pocket was then closed in 2 layers with 2.0 Vicryl suture  for the subcutaneous and subcuticular layers.  Steri-Strips and a  sterile dressing were then applied.   DFT Testing: Defibrillation Threshold testing was then performed. Ventricular fibrillation was induced with a T shock.  Adequate sensing of ventricular  fibrillation was observed with minimal dropout with a programmed sensitivity of 1.65mV.  The patient was successfully defibrillated to sinus rhythm with a single 15 joules shock delivered from the device with an impedance of 71 ohms in a duration of 7 seconds.  The patient remained in sinus rhythm thereafter.  There were no early apparent complications.  Programmed Extrastimulus testing:  Programmed extrastimulus testing was performed through the device with a basic cycle length of 520msec with S1,S2,S3,S4 extrastimuli down to refractoriness (500/260/240/220 msec) with no sustained VT or VF observed.  The procedure was therefore considered completed.  There were no early apparhent complications.     CONCLUSIONS:   1. Ischemic cardiomyopathy with chronic New York Heart Association class III heart failure.  2. Successful ICD implantation.   3. DFT less than or equal to 15 joules.   4. No inducible VT or VF with PES  5. No early apparent complications.

## 2011-08-05 NOTE — H&P (View-Only) (Signed)
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  She initially presented 03/07/09 with acute anterior STEMI.  She was found to have an occluded proximal LAD and underwent PCI with BMS.  She returned with ischemic symptoms 07/02/09 and was found to have 80% ISR which was treated with PTCA.  She did not require a stent.  She subsequently developed PTE for which she was treated.  She returned with late stent occlusion and anterior MI 04/11/10 for which she again required PTCA after thrombectomy.  Stenting was not required.  She has been treated with an optimal medical regimen by Dr [name] but continues to have a depressed ejection fraction.  Lexiscan 05/22/11 revealed a large anteroseptal and anteroapical wall infarct with no ischemia. EF 30%.  An echo 05/22/11 revealed septal apical and anterior wall hypokinesis with EF 25%. She is referred for consideration of ICD implantation at this time.  Presently, she notes significant SOB with minimal activity.  She is dyspneic with 1 flight of stairs.  She also has difficulty with orthopnea most nights.  She has stable edema.  Today, she denies symptoms of palpitations, chest pain, dizziness, presyncope, syncope, or neurologic sequela. The patient is tolerating medications without difficulties and is otherwise without complaint today.   Past Medical History  Diagnosis Date  . CAD (coronary artery disease) 02/2009    S/P anterior MI  . Hyperlipemia   . Benign neoplasm of colon   . Unspecified mastoiditis   . Irritable bowel syndrome   . Hypertension   . Diverticulosis   . Colon polyp   . Ischemic cardiomyopathy     EF 25%  . Chronic systolic dysfunction of left ventricle   . Pulmonary embolism   . Chronic renal  insufficiency    Past Surgical History  Procedure Date  . Total abdominal hysterectomy   . Bilateral salpingoophorectomy   . Inner ear surgery     left x 17  . Cervical spine surgery 08/08  . Lumbar disc surgery 08/09  . Cad( bare metal stent) 8/10  . Angioplasty 07/02/09, 04/01/10    Current Outpatient Prescriptions  Medication Sig Dispense Refill  . albuterol (PROAIR HFA) 108 (90 BASE) MCG/ACT inhaler Inhale 2 puffs into the lungs every 6 (six) hours as needed. For shortness of breath      . amitriptyline (ELAVIL) 50 MG tablet Take 1 tablet (50 mg total) by mouth at bedtime as needed for sleep.  30 tablet  3  . aspirin 81 MG tablet Take 81 mg by mouth daily.        . bisoprolol (ZEBETA) 10 MG tablet Take 15 mg by mouth daily. Take 1 1/2 tablets daily      . Cholecalciferol (VITAMIN D) 2000 UNITS tablet Take 2,000 Units by mouth 2 (two) times daily.        Marland Kitchen lisinopril (PRINIVIL,ZESTRIL) 10 MG tablet Take 10 mg by mouth daily.        . pantoprazole (PROTONIX) 40 MG tablet TAKE 1 TABLET BY MOUTH ONCE DAILY 12 HOURS APART FROM PLAVIX.  30 tablet  5  . rosuvastatin (CRESTOR) 20 MG tablet Take 1 tablet (20 mg total) by mouth at bedtime.  30 tablet  11  . topiramate (TOPAMAX) 25 MG tablet  Take 50 mg by mouth at bedtime.          Allergies  Allergen Reactions  . Sulfonamide Derivatives     History   Social History  . Marital Status: Divorced    Spouse Name: N/A    Number of Children: N/A  . Years of Education: N/A   Occupational History  . Unemployed    Social History Main Topics  . Smoking status: Former Smoker    Quit date: 04/27/2009  . Smokeless tobacco: Not on file  . Alcohol Use: No  . Drug Use: No  . Sexually Active: Not on file   Other Topics Concern  . Not on file   Social History Narrative   Pt lives in [place] alone.  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 Colon cancer Mother   . Colon  cancer Brother   . Cancer Brother     Bladder  . Breast cancer Cousin     ROS- All systems are reviewed and negative except as per the HPI above  Physical Exam: Filed Vitals:   07/28/11 1615  BP: 130/84  Pulse: 72  Height: 5\' 2"  (1.575 m)  Weight: 173 lb (78.472 kg)    GEN- The patient is overweight appearing, alert and oriented x 3 today.   Head- normocephalic, atraumatic Eyes-  Sclera clear, conjunctiva pink Ears- hearing intact Oropharynx- clear Neck- supple, no JVP Lymph- no cervical lymphadenopathy Lungs- Clear to ausculation bilaterally, normal work of breathing Heart- Regular rate and rhythm, no murmurs, rubs or gallops, PMI not laterally displaced GI- soft, NT, ND, + BS Extremities- no clubbing, cyanosis, trace edema MS- no significant deformity or atrophy Skin- no rash or lesion Psych- euthymic mood, full affect Neuro- strength and sensation are intact  EKG 05/19/11- sinus rhythm 82 bpm, PR 168, QRS 94, Qtc 460, anteroseptal MI, LAHB  Assessment and Plan:

## 2011-08-05 NOTE — Brief Op Note (Signed)
08/05/2011  11:22 AM  PATIENT:  [name]  66 y.o. female  PRE-OPERATIVE DIAGNOSIS:  lv dysfunction  POST-OPERATIVE DIAGNOSIS:  * No post-op diagnosis entered *  PROCEDURE:  Procedure(s): IMPLANTABLE CARDIOVERTER DEFIBRILLATOR IMPLANT  SURGEON:  Surgeon(s): Coralyn Mark, MD  PHYSICIAN ASSISTANT:   ASSISTANTS: none   ANESTHESIA:   IV sedation  EBL:   0cc  BLOOD ADMINISTERED:none  DRAINS: none   LOCAL MEDICATIONS USED:  LIDOCAINE 5CC  SPECIMEN:  No Specimen  DISPOSITION OF SPECIMEN:  N/A  COUNTS:  YES  TOURNIQUET:  * No tourniquets in log *  DICTATION: .Note written in EPIC  PLAN OF CARE: Admit for overnight observation  PATIENT DISPOSITION:  PACU - hemodynamically stable.   Delay start of Pharmacological VTE agent (>24hrs) due to surgical blood loss or risk of bleeding:  {YES/NO/NOT APPLICABLE:20182

## 2011-08-05 NOTE — Interval H&P Note (Signed)
History and Physical Interval Note:  08/05/2011 9:42 AM  [name]  has presented today for surgery, with the diagnosis of ischemic CM/ chronic systolic dysfunction.  The various methods of treatment have been discussed with the patient and family. After consideration of risks, benefits and other options for treatment, the patient has consented to  Procedure(s): IMPLANTABLE CARDIOVERTER DEFIBRILLATOR IMPLANT as a surgical intervention .  The patients' history has been reviewed, patient examined, no change in status, stable for surgery.  I have reviewed the patients' chart and labs.  Questions were answered to the patient's satisfaction.     [name]

## 2011-08-06 ENCOUNTER — Ambulatory Visit (HOSPITAL_COMMUNITY): Payer: Medicare Other

## 2011-08-06 ENCOUNTER — Other Ambulatory Visit: Payer: Self-pay

## 2011-08-06 ENCOUNTER — Ambulatory Visit: Payer: Medicare Other | Admitting: Cardiology

## 2011-08-06 DIAGNOSIS — Z95810 Presence of automatic (implantable) cardiac defibrillator: Secondary | ICD-10-CM

## 2011-08-06 HISTORY — DX: Presence of automatic (implantable) cardiac defibrillator: Z95.810

## 2011-08-06 MED ORDER — CHLORHEXIDINE GLUCONATE CLOTH 2 % EX PADS
6.0000 | MEDICATED_PAD | Freq: Every day | CUTANEOUS | Status: DC
  Administered 2011-08-06: 6 via TOPICAL

## 2011-08-06 MED ORDER — MUPIROCIN 2 % EX OINT
1.0000 "application " | TOPICAL_OINTMENT | Freq: Two times a day (BID) | CUTANEOUS | Status: DC
  Administered 2011-08-06: 1 via NASAL
  Filled 2011-08-06: qty 22

## 2011-08-06 NOTE — Discharge Summary (Signed)
Discharge Summary   Patient ID: [name] MRN: [id number], DOB/AGE: 1946/05/21 66 y.o.  Primary MD: Theressa Millard, MD, MD Primary Cardiologist: Bing Quarry MD/ALLRED, Jeneen Rinks MD  Admit date: 08/05/2011 D/C date:     08/06/2011      Primary Discharge Diagnoses:  1. NYHA class III Systolic CHF 2/2 ischemic CM (EF 25%)   - s/p [employer] Jude ICD placement 08/05/11  Secondary Discharge Diagnoses:  1. Coronary Artery Disease (s/p anterior STEMI w/ BMS to prox LAD 02/2009, ISR s/p PTCA 06/2009, ISR s/p thrombectomy & PTCA 03/2010) 2. H/o Pulmonary Embolism 3. Hypertension 4. Hyperlipidemia  Allergies Allergen Reactions  . Sulfonamide Derivatives Other (See Comments)    UNSURE   Diagnostic Studies/Procedures:   08/05/11 - ICD Implantation 1. Ischemic cardiomyopathy with chronic New York Heart Association class III heart failure.  2. Successful [employer] [id number] (serial Number [id number]) ICD  implantation.  3. DFT less than or equal to 15 joules.  4. No inducible VT or VF with PES  5. No early apparent complications.   Dg Chest 2 View 08/06/2011 Findings: Cardiomediastinal silhouette is stable.  No acute infiltrate or pulmonary edema. There is a single lead cardiac defibrillator with left subclavian approach with tip of the lead in the right ventricle.  No diagnostic pneumothorax.  Metallic fixation plate noted cervical spine.  No acute infiltrate or pulmonary edema.  Degenerative changes thoracic spine.  IMPRESSION: There is a single lead cardiac defibrillator with left subclavian approach with tip of the lead in the right ventricle.  No diagnostic pneumothorax. No acute infiltrate or pulmonary edema.   History of Present Illness: 66 y.o. female w/ PMHx significant for HLD, HTN, PE, CAD (s/p anterior MI w/ BMS '10, & ISR x2 s/p PTCA), NYHA class III Systolic CHF 2/2 ischemic CM (EF 25%) who presented to [facility] on 08/05/11 for planned ICD implantation  for primary prevention of sudden death.  She is a patient of Dr. [name] who referred her to Dr. Rayann Heman on 07/28/11 for risk stratification for sudden death and consideration of ICD implantation for depressed EF despite optimal medical management. She has significant shortness of breath with minimal activity, is dyspneic with 1 flight of stairs, and has difficulty with orthopnea most nights. Due to a narrow QRS she did not meet criteria for revascularization. She did meet MADIT II/ SCD-HeFT criteria for ICD implantation for primary prevention of sudden death.  Hospital Course: She presented to [facility] on 08/05/11 as planned and underwent ICD implantation as outline above. She tolerated the procedure well without complication. Post-procedure CXR was without pneumothorax. She was discharged in stable condition to home on 08/06/11 with follow up as scheduled below.  Discharge Vitals: Blood pressure 133/85, pulse 79, temperature 98 F (36.7 C), temperature source Oral, resp. rate 18, height 5\' 2"  (1.575 m), weight 173 lb (78.472 kg), SpO2 95.00%.  Labs: Lab Results  Component Value Date   WBC 5.1 08/05/2011   HGB 11.6* 08/05/2011   HCT 34.7* 08/05/2011   MCV 95.9 08/05/2011   PLT 193 08/05/2011    Lab 08/05/11 0817  NA 141  K 4.3  CL 107  CO2 24  BUN 18  CREATININE 1.09  CALCIUM 9.3  GLUCOSE 104*     08/05/2011 08:17  Prothrombin Time 12.9  INR 0.95     08/05/2011 08:33  MRSA, PCR POSITIVE (A)  Staphylococcus aureus POSITIVE (A)    Discharge Medications   Current Discharge  Medication List    CONTINUE these medications which have NOT CHANGED   Details  albuterol (PROAIR HFA) 108 (90 BASE) MCG/ACT inhaler Inhale 2 puffs into the lungs every 6 (six) hours as needed. For shortness of breath    amitriptyline (ELAVIL) 50 MG tablet Take 50 mg by mouth at bedtime.     aspirin 81 MG tablet Take 81 mg by mouth daily.     bisoprolol (ZEBETA) 10 MG tablet Take 5 mg by mouth daily. Take 1 1/2  tablets daily    Cholecalciferol (VITAMIN D) 2000 UNITS tablet Take 2,000 Units by mouth 2 (two) times daily.     furosemide (LASIX) 40 MG tablet Take 40 mg by mouth daily.     HYDROcodone-acetaminophen (VICODIN) 5-500 MG per tablet Take 1 tablet by mouth every 6 (six) hours as needed. BACK PAIN    lisinopril (PRINIVIL,ZESTRIL) 10 MG tablet Take 10 mg by mouth daily.     pantoprazole (PROTONIX) 40 MG tablet Take 40 mg by mouth daily.     rosuvastatin (CRESTOR) 20 MG tablet Take 20 mg by mouth at bedtime.      topiramate (TOPAMAX) 25 MG tablet Take 25 mg by mouth at bedtime.         Disposition   Discharge Orders    Future Appointments: Provider: Department: Dept Phone: Center:   08/18/2011 10:30 AM [place] [phone] [facility]   09/19/2011 11:15 AM Bing Quarry, [place] [phone] [facility]     Future Orders Please Complete By Expires   Diet - low sodium heart healthy      Increase activity slowly        Follow-up Information    Follow up with [facility] on 08/18/2011. (Device Clinic/Wound Check @ 10:30)    Contact information:   [facility] [address] [place] [phone]      Follow up with Bing Quarry, MD on 09/19/2011. (11:15)    Contact information:   [facility] [place] [phone]       Follow up with Thompson Grayer, MD. (Our office will call you with an Electrophysiology follow up appointment)    Contact information:   [facility] [place], [place] [phone]           Outstanding Labs/Studies: None  Duration of Discharge Encounter: Greater than 30 minutes including physician and PA time.  Signed, Bruin Bolger PA-C 08/06/2011, 11:56 AM

## 2011-08-06 NOTE — Progress Notes (Addendum)
Patient ID: [name], female   DOB: 10/25/1945, 66 y.o.   MRN: [id number] Subjective:  Denies chest pain or sob.  Objective:  Vital Signs in the last 24 hours: Temp:  [98 F (36.7 C)] 98 F (36.7 C) (01/08 2100) Pulse Rate:  [79] 79  (01/08 2100) Resp:  [18-19] 18  (01/08 2100) BP: (133-136)/(85-91) 133/85 mmHg (01/08 2100) SpO2:  [95 %-96 %] 95 % (01/08 2100)  Intake/Output from previous day: 01/08 0701 - 01/09 0700 In: 390 [P.O.:240; IV Piggyback:150] Out: 200 [Urine:200] Intake/Output from this shift:    Physical Exam: Well appearing NAD HEENT: Unremarkable Neck:  No JVD, no thyromegally Lymphatics:  No adenopathy Back:  No CVA tenderness Lungs:  Clear with no wheezes, rales, or rhonchi. No hematoma. HEART:  Regular rate rhythm, no murmurs, no rubs, no clicks Abd:  Flat, positive bowel sounds, no organomegally, no rebound, no guarding Ext:  2 plus pulses, no edema, no cyanosis, no clubbing Skin:  No rashes no nodules Neuro:  CN II through XII intact, motor grossly intact  Lab Results:  Basename 08/05/11 0817  WBC 5.1  HGB 11.6*  PLT 193    Basename 08/05/11 0817  NA 141  K 4.3  CL 107  CO2 24  GLUCOSE 104*  BUN 18  CREATININE 1.09   No results found for this basename: TROPONINI:2,CK,MB:2 in the last 72 hours Hepatic Function Panel No results found for this basename: PROT,ALBUMIN,AST,ALT,ALKPHOS,BILITOT,BILIDIR,IBILI in the last 72 hours No results found for this basename: CHOL in the last 72 hours No results found for this basename: PROTIME in the last 72 hours  Imaging: Dg Chest 2 View  08/06/2011  *RADIOLOGY REPORT*  Clinical Data: Status post ICD  CHEST - 2 VIEW  Comparison: 05/13/2011  Findings: Cardiomediastinal silhouette is stable.  No acute infiltrate or pulmonary edema. There is a single lead cardiac defibrillator with left subclavian approach with tip of the lead in the right ventricle.  No diagnostic pneumothorax.  Metallic fixation plate  noted cervical spine.  No acute infiltrate or pulmonary edema.  Degenerative changes thoracic spine.  IMPRESSION: There is a single lead cardiac defibrillator with left subclavian approach with tip of the lead in the right ventricle.  No diagnostic pneumothorax. No acute infiltrate or pulmonary edema.  Original Report Authenticated By: Lahoma Crocker, M.D.    Cardiac Studies:  Assessment/Plan:  1. Chronic systolic heart failure 2. Status post ICD insertion We'll plan to discharge the patient later this morning. She will continue her current medical therapy. She will followup in the usual manner.  LOS: 1 day    Cristopher Peru 08/06/2011, 9:06 AM

## 2011-08-07 ENCOUNTER — Telehealth: Payer: Self-pay | Admitting: Cardiology

## 2011-08-07 NOTE — Telephone Encounter (Signed)
Spoke w/pt's son---advised pt that machine had nothing to do with the way pt was feeling. Pt nauseated with no fevers. Pt real cold. Pt was implanted on 08-05-11. Son to hook up machine once pt is feeling better.

## 2011-08-07 NOTE — Telephone Encounter (Signed)
Pt had episode after hooking up machine with device, pt got really cold, had some chest pressure, pls advise

## 2011-08-18 ENCOUNTER — Ambulatory Visit: Payer: Medicare Other | Admitting: *Deleted

## 2011-08-21 ENCOUNTER — Ambulatory Visit (INDEPENDENT_AMBULATORY_CARE_PROVIDER_SITE_OTHER): Payer: Medicare Other | Admitting: *Deleted

## 2011-08-21 ENCOUNTER — Encounter: Payer: Self-pay | Admitting: Internal Medicine

## 2011-08-21 DIAGNOSIS — I255 Ischemic cardiomyopathy: Secondary | ICD-10-CM

## 2011-08-21 DIAGNOSIS — I2589 Other forms of chronic ischemic heart disease: Secondary | ICD-10-CM

## 2011-08-21 LAB — ICD DEVICE OBSERVATION
DEVICE MODEL ICD: 1016523
PACEART VT: 0
TOT-0007: 1
TOT-0008: 0
TOT-0009: 1
TZON-0004SLOWVT: 24
TZON-0005SLOWVT: 6
VENTRICULAR PACING ICD: 0 pct

## 2011-08-21 NOTE — Progress Notes (Signed)
Wound check ICD with industry for research

## 2011-09-19 ENCOUNTER — Encounter: Payer: Self-pay | Admitting: Cardiology

## 2011-09-19 ENCOUNTER — Ambulatory Visit (INDEPENDENT_AMBULATORY_CARE_PROVIDER_SITE_OTHER): Payer: Medicare Other | Admitting: Cardiology

## 2011-09-19 VITALS — BP 120/86 | HR 93 | Ht 62.0 in | Wt 174.8 lb

## 2011-09-19 DIAGNOSIS — D649 Anemia, unspecified: Secondary | ICD-10-CM

## 2011-09-19 DIAGNOSIS — I2589 Other forms of chronic ischemic heart disease: Secondary | ICD-10-CM

## 2011-09-19 DIAGNOSIS — I255 Ischemic cardiomyopathy: Secondary | ICD-10-CM

## 2011-09-19 DIAGNOSIS — F17200 Nicotine dependence, unspecified, uncomplicated: Secondary | ICD-10-CM

## 2011-09-19 DIAGNOSIS — E785 Hyperlipidemia, unspecified: Secondary | ICD-10-CM

## 2011-09-19 DIAGNOSIS — I2510 Atherosclerotic heart disease of native coronary artery without angina pectoris: Secondary | ICD-10-CM

## 2011-09-19 LAB — BASIC METABOLIC PANEL
BUN: 27 mg/dL — ABNORMAL HIGH (ref 6–23)
Calcium: 9.6 mg/dL (ref 8.4–10.5)
GFR: 46.7 mL/min — ABNORMAL LOW (ref >60.00)
Potassium: 4.3 mEq/L (ref 3.5–5.1)

## 2011-09-19 LAB — CBC WITH DIFFERENTIAL/PLATELET
Basophils Relative: 0.3 % (ref 0.0–3.0)
Eosinophils Absolute: 0.1 10*3/uL (ref 0.0–0.7)
Lymphs Abs: 2.4 10*3/uL (ref 0.7–4.0)
MCHC: 33.5 g/dL (ref 30.0–36.0)
MCV: 98 fl (ref 78.0–100.0)
Monocytes Absolute: 0.4 10*3/uL (ref 0.1–1.0)
Neutrophils Relative %: 53.2 % (ref 43.0–77.0)
Platelets: 185 10*3/uL (ref 150.0–400.0)
RBC: 3.86 Mil/uL — ABNORMAL LOW (ref 3.87–5.11)

## 2011-09-19 NOTE — Patient Instructions (Signed)
Your physician recommends that you have lab work today: BMP, CBC, stool cards  Your physician recommends that you schedule a follow-up appointment in: [place] physician recommends that you continue on your current medications as directed. Please refer to the Current Medication list given to you today.

## 2011-09-23 ENCOUNTER — Telehealth: Payer: Self-pay | Admitting: Cardiology

## 2011-09-23 NOTE — Telephone Encounter (Signed)
Pt aware of lab results by phone.

## 2011-09-23 NOTE — Telephone Encounter (Signed)
Fu call °Patient returning your call °

## 2011-09-26 NOTE — Progress Notes (Signed)
HPI:  She is in for a follow up visit.  Overall, her status is stable.  She has had some atypical pain.  She had six sharp pains sitting in a restaurant.  When she walks in the mall, she will sometimes develop a sweat.  She says that she is not smoking at all.  She has not had significant swelling in her legs.  She has had migraines and also some back issues.  Notably, she has had prior PE.  She wonders if it could be related to her defibrillator.    Current Outpatient Prescriptions  Medication Sig Dispense Refill  . albuterol (PROAIR HFA) 108 (90 BASE) MCG/ACT inhaler Inhale 2 puffs into the lungs every 6 (six) hours as needed. For shortness of breath      . amitriptyline (ELAVIL) 50 MG tablet Take 50 mg by mouth at bedtime.       Marland Kitchen aspirin 81 MG tablet Take 81 mg by mouth daily.       . bisoprolol (ZEBETA) 10 MG tablet Take 5 mg by mouth daily. Take 1 1/2 tablets daily      . Cholecalciferol (VITAMIN D) 2000 UNITS tablet Take 2,000 Units by mouth 2 (two) times daily.       . furosemide (LASIX) 40 MG tablet Take 40 mg by mouth daily.       Marland Kitchen HYDROcodone-acetaminophen (VICODIN) 5-500 MG per tablet Take 1 tablet by mouth every 6 (six) hours as needed. BACK PAIN      . lisinopril (PRINIVIL,ZESTRIL) 10 MG tablet Take 10 mg by mouth daily.       . pantoprazole (PROTONIX) 40 MG tablet Take 40 mg by mouth daily.       . rosuvastatin (CRESTOR) 20 MG tablet Take 20 mg by mouth at bedtime.        . topiramate (TOPAMAX) 25 MG tablet Take 25 mg by mouth at bedtime.         Allergies  Allergen Reactions  . Sulfonamide Derivatives Other (See Comments)    UNSURE    Past Medical History  Diagnosis Date  . CAD (coronary artery disease) 02/2009    S/P anterior MI  . Hyperlipemia   . Benign neoplasm of colon   . Unspecified mastoiditis   . Irritable bowel syndrome   . Hypertension   . Diverticulosis   . Colon polyp   . Ischemic cardiomyopathy     EF 25%  . Chronic systolic dysfunction of left  ventricle   . Pulmonary embolism   . Chronic renal insufficiency     Past Surgical History  Procedure Date  . Total abdominal hysterectomy   . Bilateral salpingoophorectomy   . Inner ear surgery     left x 17  . Cervical spine surgery 08/08  . Lumbar disc surgery 08/09  . Cad( bare metal stent) 8/10  . Angioplasty 07/02/09, 04/01/10    Family History  Problem Relation Age of Onset  . Colon cancer Mother   . Colon cancer Brother   . Cancer Brother     Bladder  . Breast cancer Cousin     History   Social History  . Marital Status: Divorced    Spouse Name: N/A    Number of Children: N/A  . Years of Education: N/A   Occupational History  . Unemployed    Social History Main Topics  . Smoking status: Former Smoker    Quit date: 04/27/2009  . Smokeless tobacco: Not on file  .  Alcohol Use: No  . Drug Use: No  . Sexually Active: Not on file   Other Topics Concern  . Not on file   Social History Narrative   Pt lives in [place] alone.  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  All other systems reviewed and negative.  PHYSICAL EXAM:  BP 120/86  Pulse 93  Ht 5\' 2"  (1.575 m)  Wt 174 lb 12.8 oz (79.289 kg)  BMI 31.97 kg/m2  General: Well developed, well nourished, in no acute distress. Head:  Normocephalic and atraumatic. Neck: no JVD.  Defib site looks well healed.  Lungs: Clear to auscultation and percussion. Heart: Normal S1 and S2.  No murmur, rubs or gallops.  Abdomen:  Normal bowel sounds; soft; non tender; no organomegaly Pulses: Pulses normal in all 4 extremities. Extremities: No clubbing or cyanosis. No edema. Neurologic: Alert and oriented x 3.  EKG:  NSR.  LAE.  Left axis deviation with LAFB.  IRBBB.  T inversion in I, AVL cannot exclude ischemia.  Septal MI, age indeterminate  ASSESSMENT AND PLAN:

## 2011-09-29 ENCOUNTER — Other Ambulatory Visit: Payer: Medicare Other

## 2011-09-29 LAB — HEMOCCULT SLIDES (X 3 CARDS)
Fecal Occult Blood: NEGATIVE
OCCULT 1: NEGATIVE
OCCULT 2: NEGATIVE
OCCULT 3: NEGATIVE
OCCULT 4: NEGATIVE
OCCULT 5: NEGATIVE

## 2011-10-02 ENCOUNTER — Telehealth: Payer: Self-pay | Admitting: Cardiology

## 2011-10-02 ENCOUNTER — Encounter: Payer: Medicare Other | Admitting: Internal Medicine

## 2011-10-02 NOTE — Telephone Encounter (Signed)
Please return call to patient on cell # [phone]  Patient is returning nurse [facility] call regarding test result, she can be reached on mobile # today.

## 2011-10-02 NOTE — Telephone Encounter (Signed)
Pt aware of stool card results by phone.

## 2011-10-08 ENCOUNTER — Telehealth: Payer: Self-pay | Admitting: Cardiology

## 2011-10-08 NOTE — Telephone Encounter (Signed)
I spoke with the pt and she needs an epidural injection.  The injection has not been scheduled at this time. The pt is being followed by Dr Brien Few at the [facility] clinic.  The pt needs approval to hold ASA for 7 days.  The pt said that they also told her that if the first injection did not work then she will need a 2nd injection.  The pt would also like to go ahead and get the okay to hold ASA a second time if needed.  I will forward this information to Dr [name] for review.

## 2011-10-08 NOTE — Telephone Encounter (Signed)
New msg Pt is to have epidural shot in back . But she needs to be off aspirin for 7 days. Please call

## 2011-10-12 NOTE — Assessment & Plan Note (Signed)
Says she is not using.

## 2011-10-12 NOTE — Assessment & Plan Note (Addendum)
She has had prior placement of AICD.  Hemodynamically has been stable.  On ACE and Beta blockade.  Will check BMET.  Overall site looks good.   No discharges.

## 2011-10-12 NOTE — Assessment & Plan Note (Addendum)
[name] has prior non DES in the LAD followed by in stent restenosis and redilitation, and then also subsequent stent thrombosis treated emergently.  She has remained on ASA and has been stable, alhtough she intermittently has some symptoms which are of concern.  We will continue to monitor her closely.    She should remain on single dose ASA.

## 2011-10-12 NOTE — Assessment & Plan Note (Signed)
Recheck CBC and stools.

## 2011-10-12 NOTE — Assessment & Plan Note (Signed)
Last LDL was 75 mg/dl, with normal LFTS.  Tolerates so will not change.

## 2011-10-23 NOTE — Telephone Encounter (Signed)
Dr [name] called and spoke with Dr Brien Few about pt and is awaiting a return call from Dr Brien Few about patient.

## 2011-11-07 NOTE — Telephone Encounter (Signed)
I called Dr. Lauris Poag office again as I had not heard from him.  He was not in but they were going to give him the message to call me when he can.  TS

## 2011-11-24 ENCOUNTER — Encounter: Payer: Self-pay | Admitting: Internal Medicine

## 2011-11-24 ENCOUNTER — Encounter (INDEPENDENT_AMBULATORY_CARE_PROVIDER_SITE_OTHER): Payer: Medicare Other

## 2011-11-24 ENCOUNTER — Ambulatory Visit (INDEPENDENT_AMBULATORY_CARE_PROVIDER_SITE_OTHER): Payer: Medicare Other | Admitting: Internal Medicine

## 2011-11-24 VITALS — BP 128/90 | HR 95 | Resp 18 | Ht 63.0 in | Wt 170.4 lb

## 2011-11-24 DIAGNOSIS — I5022 Chronic systolic (congestive) heart failure: Secondary | ICD-10-CM

## 2011-11-24 DIAGNOSIS — R0989 Other specified symptoms and signs involving the circulatory and respiratory systems: Secondary | ICD-10-CM

## 2011-11-24 DIAGNOSIS — I255 Ischemic cardiomyopathy: Secondary | ICD-10-CM

## 2011-11-24 DIAGNOSIS — I509 Heart failure, unspecified: Secondary | ICD-10-CM

## 2011-11-24 DIAGNOSIS — I2589 Other forms of chronic ischemic heart disease: Secondary | ICD-10-CM

## 2011-11-24 NOTE — Assessment & Plan Note (Signed)
Normal ICD function See Pace Art report No changes today  

## 2011-11-24 NOTE — Assessment & Plan Note (Signed)
No ischemic symptoms No chf on exam

## 2011-11-24 NOTE — Progress Notes (Signed)
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 female who presents today for routine electrophysiology followup.  Since having her ICD iimplanted, the patient reports doing very well.  Today, she denies symptoms of palpitations, chest pain, shortness of breath,  lower extremity edema, dizziness, presyncope, syncope, or ICD shocks.  The patient is otherwise without complaint today.   Past Medical History  Diagnosis Date  . CAD (coronary artery disease) 02/2009    S/P anterior MI  . Hyperlipemia   . Benign neoplasm of colon   . Unspecified mastoiditis   . Irritable bowel syndrome   . Hypertension   . Diverticulosis   . Colon polyp   . Ischemic cardiomyopathy     EF 25%  . Chronic systolic dysfunction of left ventricle   . Pulmonary embolism   . Chronic renal insufficiency    Past Surgical History  Procedure Date  . Total abdominal hysterectomy   . Bilateral salpingoophorectomy   . Inner ear surgery     left x 17  . Cervical spine surgery 08/08  . Lumbar disc surgery 08/09  . Cad( bare metal stent) 8/10  . Angioplasty 07/02/09, 04/01/10  . Cardiac defibrillator placement 08/05/11    Primary prevention SJM ICD implanted,  Analyze ST study patient    Current Outpatient Prescriptions  Medication Sig Dispense Refill  . albuterol (PROAIR HFA) 108 (90 BASE) MCG/ACT inhaler Inhale 2 puffs into the lungs every 6 (six) hours as needed. For shortness of breath      . amitriptyline (ELAVIL) 50 MG tablet Take 50 mg by mouth at bedtime.       Marland Kitchen aspirin 81 MG tablet Take 81 mg by mouth daily.       . bisoprolol (ZEBETA) 10 MG tablet Take 5 mg by mouth daily. Take 1 1/2 tablets daily      . lisinopril (PRINIVIL,ZESTRIL) 10 MG tablet Take 10 mg by mouth daily.       . pantoprazole (PROTONIX) 40 MG tablet Take 40 mg by mouth daily.       . rosuvastatin (CRESTOR) 20 MG tablet Take 20 mg by mouth at bedtime.        . topiramate (TOPAMAX) 25 MG tablet  Take 25 mg by mouth at bedtime.       . Cholecalciferol (VITAMIN D) 2000 UNITS tablet Take 2,000 Units by mouth 2 (two) times daily.       . furosemide (LASIX) 40 MG tablet Take 40 mg by mouth daily.       Marland Kitchen HYDROcodone-acetaminophen (VICODIN) 5-500 MG per tablet Take 1 tablet by mouth every 6 (six) hours as needed. BACK PAIN        Physical Exam: Filed Vitals:   11/24/11 1035  BP: 128/90  Pulse: 95  Resp: 18  Height: 5\' 3"  (1.6 m)  Weight: 170 lb 6.4 oz (77.293 kg)    GEN- The patient is well appearing, alert and oriented x 3 today.   Head- normocephalic, atraumatic Eyes-  Sclera clear, conjunctiva pink Ears- hearing intact Oropharynx- clear Lungs- Clear to ausculation bilaterally, normal work of breathing Chest- ICD pocket is well healed Heart- Regular rate and rhythm, no murmurs, rubs or gallops, PMI not laterally displaced GI- soft, NT, ND, + BS Extremities- no clubbing, cyanosis, or edema  ICD interrogation- reviewed in detail today,  See PACEART report  Assessment and Plan:

## 2011-11-24 NOTE — Patient Instructions (Signed)
Your physician wants you to follow-up in: 6 months with Dr. Allred. You will receive a reminder letter in the mail two months in advance. If you don't receive a letter, please call our office to schedule the follow-up appointment.  

## 2011-11-28 NOTE — Telephone Encounter (Signed)
I spoke with the pt and she said that Dr Brien Few did an epidural injection on her about 2-3 weeks ago.  The pt does still have some back discomfort at this time. The pt will continue to follow-up with Dr Brien Few for back issues.

## 2011-12-03 LAB — ICD DEVICE OBSERVATION
BRDY-0002RV: 40 {beats}/min
CHARGE TIME: 9.3 s
FVT: 0
HV IMPEDENCE: 78 Ohm
PACEART VT: 0
RV LEAD AMPLITUDE: 12 mv
RV LEAD IMPEDENCE ICD: 562.5 Ohm
RV LEAD THRESHOLD: 0.5 V
TOT-0008: 0
TZON-0004SLOWVT: 24
TZON-0010SLOWVT: 40 ms
VF: 0

## 2011-12-23 ENCOUNTER — Encounter: Payer: Self-pay | Admitting: Cardiology

## 2011-12-23 ENCOUNTER — Ambulatory Visit (INDEPENDENT_AMBULATORY_CARE_PROVIDER_SITE_OTHER): Payer: Medicare Other | Admitting: Cardiology

## 2011-12-23 VITALS — BP 122/82 | HR 104 | Resp 17 | Ht 62.0 in | Wt 169.4 lb

## 2011-12-23 DIAGNOSIS — F17200 Nicotine dependence, unspecified, uncomplicated: Secondary | ICD-10-CM

## 2011-12-23 DIAGNOSIS — I255 Ischemic cardiomyopathy: Secondary | ICD-10-CM

## 2011-12-23 DIAGNOSIS — I2510 Atherosclerotic heart disease of native coronary artery without angina pectoris: Secondary | ICD-10-CM

## 2011-12-23 DIAGNOSIS — E785 Hyperlipidemia, unspecified: Secondary | ICD-10-CM

## 2011-12-23 DIAGNOSIS — Z95810 Presence of automatic (implantable) cardiac defibrillator: Secondary | ICD-10-CM

## 2011-12-23 DIAGNOSIS — D649 Anemia, unspecified: Secondary | ICD-10-CM

## 2011-12-23 DIAGNOSIS — I2589 Other forms of chronic ischemic heart disease: Secondary | ICD-10-CM

## 2011-12-23 NOTE — Assessment & Plan Note (Signed)
Appears to be stable.  

## 2011-12-23 NOTE — Progress Notes (Signed)
HPI:  The patient is remaining stable. She assures me that she is not smoking, and also assures me that she is taking her medications. She denies any significant chest pain. She is tolerating her medicines well, and she feels as though she is doing quite well. She had her defibrillator recently checked by Dr. Rayann Heman, and this is functioning. Overall ejection fraction is 25%. She had a left ventricular mural thrombus noted by echocardiography, but previously had a course of warfarin during which she was treated for this. We have not continued warfarin in part is related concerns about medication compliance. She was mildly anemic, recent stools were negative, and these were checked at the last office visit. She had her back injected, and she still has a moderate amount of pain so she may need additional injections in the near future. She also has hammertoe. She talked about possibly having surgery on this, but she and I reviewed this in detail.  Current Outpatient Prescriptions  Medication Sig Dispense Refill  . albuterol (PROAIR HFA) 108 (90 BASE) MCG/ACT inhaler Inhale 2 puffs into the lungs every 6 (six) hours as needed. For shortness of breath      . amitriptyline (ELAVIL) 50 MG tablet Take 50 mg by mouth at bedtime.       Marland Kitchen aspirin 81 MG tablet Take 81 mg by mouth daily.       . bisoprolol (ZEBETA) 10 MG tablet Take 5 mg by mouth daily. Take 1 1/2 tablets daily      . rosuvastatin (CRESTOR) 20 MG tablet Take 20 mg by mouth at bedtime.        . topiramate (TOPAMAX) 25 MG tablet Take 25 mg by mouth at bedtime.       . Cholecalciferol (VITAMIN D) 2000 UNITS tablet Take 2,000 Units by mouth 2 (two) times daily.       . furosemide (LASIX) 40 MG tablet Take 40 mg by mouth daily.       Marland Kitchen HYDROcodone-acetaminophen (VICODIN) 5-500 MG per tablet Take 1 tablet by mouth every 6 (six) hours as needed. BACK PAIN      . lisinopril (PRINIVIL,ZESTRIL) 10 MG tablet Take 10 mg by mouth daily.       . pantoprazole  (PROTONIX) 40 MG tablet Take 40 mg by mouth daily.         Allergies  Allergen Reactions  . Sulfonamide Derivatives Other (See Comments)    UNSURE    Past Medical History  Diagnosis Date  . CAD (coronary artery disease) 02/2009    S/P anterior MI  . Hyperlipemia   . Benign neoplasm of colon   . Unspecified mastoiditis   . Irritable bowel syndrome   . Hypertension   . Diverticulosis   . Colon polyp   . Ischemic cardiomyopathy     EF 25%  . Chronic systolic dysfunction of left ventricle   . Pulmonary embolism   . Chronic renal insufficiency     Past Surgical History  Procedure Date  . Total abdominal hysterectomy   . Bilateral salpingoophorectomy   . Inner ear surgery     left x 17  . Cervical spine surgery 08/08  . Lumbar disc surgery 08/09  . Cad( bare metal stent) 8/10  . Angioplasty 07/02/09, 04/01/10  . Cardiac defibrillator placement 08/05/11    Primary prevention SJM ICD implanted,  Analyze ST study patient    Family History  Problem Relation Age of Onset  . Colon cancer Mother   .  Colon cancer Brother   . Cancer Brother     Bladder  . Breast cancer Cousin     History   Social History  . Marital Status: Divorced    Spouse Name: N/A    Number of Children: N/A  . Years of Education: N/A   Occupational History  . Unemployed    Social History Main Topics  . Smoking status: Former Smoker    Quit date: 04/27/2009  . Smokeless tobacco: Not on file  . Alcohol Use: No  . Drug Use: No  . Sexually Active: Not on file   Other Topics Concern  . Not on file   Social History Narrative   Pt lives in [place] alone.  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  All other systems reviewed and negative.  PHYSICAL EXAM:  BP 122/82  Pulse 104  Resp 17  Ht 5\' 2"  (1.575 m)  Wt 169 lb 6.4 oz (76.839 kg)  BMI 30.98 kg/m2  General: Well developed, well nourished, in no acute distress. Head:  Normocephalic and  atraumatic. Neck: no JVD Lungs: Clear to auscultation and percussion. Heart: Normal S1 and S2.  Prominent S4 gallop.   Pulses: Pulses normal in all 4 extremities. Extremities: No clubbing or cyanosis. No edema. Neurologic: Alert and oriented x 3.  EKG:  ST.  Biatrial enlargement.  Pulmonary disease pattern.  Leftward axis and delay in R wave progression. ECG is unchanged from 11/24/2011.  ASSESSMENT AND PLAN:

## 2011-12-23 NOTE — Assessment & Plan Note (Signed)
She appears to be stable.  HR is a little fast, but came down to 90 while she was sitting.  This is her baseline.  We could change to carvedilol, and we may consider this, but she says she is doing the best she has in a long time, so I am reluctant to rock the boat.  Will continue to follow her closely,  ICD is functional.  See note.

## 2011-12-23 NOTE — Assessment & Plan Note (Signed)
Last LDL was 75 on treatment.

## 2011-12-23 NOTE — Patient Instructions (Signed)
Your physician recommends that you have lab work today: [facility]  Your physician recommends that you schedule a follow-up appointment in: 3 MONTHS with Dr [name]  Your physician recommends that you continue on your current medications as directed. Please refer to the Current Medication list given to you today.

## 2011-12-23 NOTE — Assessment & Plan Note (Signed)
Continue to monitor

## 2011-12-23 NOTE — Assessment & Plan Note (Signed)
Recent check by Dr. Rayann Heman.

## 2011-12-23 NOTE — Assessment & Plan Note (Signed)
No longer smoking 

## 2011-12-24 ENCOUNTER — Telehealth: Payer: Self-pay | Admitting: Cardiology

## 2011-12-24 ENCOUNTER — Ambulatory Visit: Payer: Medicare Other | Admitting: *Deleted

## 2011-12-24 DIAGNOSIS — I10 Essential (primary) hypertension: Secondary | ICD-10-CM

## 2011-12-24 LAB — BASIC METABOLIC PANEL
CO2: 21 mEq/L (ref 19–32)
Calcium: 8.9 mg/dL (ref 8.4–10.5)
Potassium: 4.4 mEq/L (ref 3.5–5.1)
Sodium: 139 mEq/L (ref 135–145)

## 2011-12-24 NOTE — Telephone Encounter (Signed)
I spoke with the pt and yesterday the pt went to have labs drawn after her appointment with Dr [name] but the lab tech could not get a specimen.  The pt will come back into the office today for a BMP.

## 2011-12-24 NOTE — Telephone Encounter (Signed)
New problem:  Patient calling need to discuss blood work that was done on yesterday.

## 2011-12-26 ENCOUNTER — Encounter: Payer: Self-pay | Admitting: Cardiology

## 2011-12-26 NOTE — Telephone Encounter (Signed)
New Problem:    Patient called in returning your call about her blood work.  Please call back.

## 2011-12-26 NOTE — Telephone Encounter (Signed)
This encounter was created in error - please disregard.

## 2012-01-08 ENCOUNTER — Encounter (HOSPITAL_BASED_OUTPATIENT_CLINIC_OR_DEPARTMENT_OTHER): Payer: Self-pay | Admitting: Student

## 2012-01-08 ENCOUNTER — Emergency Department (HOSPITAL_BASED_OUTPATIENT_CLINIC_OR_DEPARTMENT_OTHER): Payer: Medicare Other

## 2012-01-08 ENCOUNTER — Emergency Department (HOSPITAL_BASED_OUTPATIENT_CLINIC_OR_DEPARTMENT_OTHER)
Admission: EM | Admit: 2012-01-08 | Discharge: 2012-01-08 | Disposition: A | Discharge status: Home / Self Care | Payer: Medicare Other | Attending: Emergency Medicine | Admitting: Emergency Medicine

## 2012-01-08 DIAGNOSIS — I2510 Atherosclerotic heart disease of native coronary artery without angina pectoris: Secondary | ICD-10-CM | POA: Insufficient documentation

## 2012-01-08 DIAGNOSIS — R109 Unspecified abdominal pain: Secondary | ICD-10-CM | POA: Insufficient documentation

## 2012-01-08 DIAGNOSIS — K2970 Gastritis, unspecified, without bleeding: Secondary | ICD-10-CM | POA: Insufficient documentation

## 2012-01-08 DIAGNOSIS — I252 Old myocardial infarction: Secondary | ICD-10-CM | POA: Insufficient documentation

## 2012-01-08 DIAGNOSIS — I10 Essential (primary) hypertension: Secondary | ICD-10-CM | POA: Insufficient documentation

## 2012-01-08 DIAGNOSIS — R51 Headache: Secondary | ICD-10-CM | POA: Insufficient documentation

## 2012-01-08 DIAGNOSIS — Z79899 Other long term (current) drug therapy: Secondary | ICD-10-CM | POA: Insufficient documentation

## 2012-01-08 DIAGNOSIS — M549 Dorsalgia, unspecified: Secondary | ICD-10-CM | POA: Insufficient documentation

## 2012-01-08 DIAGNOSIS — R000 Tachycardia, unspecified: Secondary | ICD-10-CM | POA: Insufficient documentation

## 2012-01-08 HISTORY — DX: Acute myocardial infarction, unspecified: I21.9

## 2012-01-08 LAB — COMPREHENSIVE METABOLIC PANEL
ALT: 15 U/L (ref 0–35)
AST: 21 U/L (ref 0–37)
Alkaline Phosphatase: 98 U/L (ref 39–117)
CO2: 18 mEq/L — ABNORMAL LOW (ref 19–32)
Chloride: 105 mEq/L (ref 96–112)
Creatinine, Ser: 1.4 mg/dL — ABNORMAL HIGH (ref 0.50–1.10)
GFR calc non Af Amer: 39 mL/min — ABNORMAL LOW (ref >90)
Potassium: 4.2 mEq/L (ref 3.5–5.1)
Sodium: 137 mEq/L (ref 135–145)
Total Bilirubin: 0.5 mg/dL (ref 0.3–1.2)

## 2012-01-08 LAB — DIFFERENTIAL
Basophils Absolute: 0 10*3/uL (ref 0.0–0.1)
Lymphocytes Relative: 17 % (ref 12–46)
Monocytes Absolute: 0.8 10*3/uL (ref 0.1–1.0)
Neutro Abs: 8.4 10*3/uL — ABNORMAL HIGH (ref 1.7–7.7)
Neutrophils Relative %: 75 % (ref 43–77)

## 2012-01-08 LAB — CBC
HCT: 43.6 % (ref 36.0–46.0)
Hemoglobin: 15.4 g/dL — ABNORMAL HIGH (ref 12.0–15.0)
RDW: 14.1 % (ref 11.5–15.5)
WBC: 11.2 10*3/uL — ABNORMAL HIGH (ref 4.0–10.5)

## 2012-01-08 LAB — OCCULT BLOOD X 1 CARD TO LAB, STOOL: Fecal Occult Bld: POSITIVE

## 2012-01-08 LAB — TROPONIN I: Troponin I: <0.3 ng/mL (ref <0.30)

## 2012-01-08 MED ORDER — PROMETHAZINE HCL 25 MG/ML IJ SOLN
12.5000 mg | Freq: Once | INTRAMUSCULAR | Status: AC
  Administered 2012-01-08: 12.5 mg via INTRAVENOUS
  Filled 2012-01-08: qty 1

## 2012-01-08 MED ORDER — ONDANSETRON HCL 4 MG/2ML IJ SOLN
INTRAMUSCULAR | Status: AC
  Filled 2012-01-08: qty 2

## 2012-01-08 MED ORDER — GI COCKTAIL ~~LOC~~
30.0000 mL | Freq: Once | ORAL | Status: AC
  Administered 2012-01-08: 30 mL via ORAL
  Filled 2012-01-08: qty 30

## 2012-01-08 MED ORDER — ONDANSETRON HCL 4 MG PO TABS: 4.0000 mg | ORAL_TABLET | Freq: Four times a day (QID) | ORAL | Status: AC

## 2012-01-08 MED ORDER — PANTOPRAZOLE SODIUM 40 MG IV SOLR
INTRAVENOUS | Status: AC
  Filled 2012-01-08: qty 40

## 2012-01-08 MED ORDER — PANTOPRAZOLE SODIUM 40 MG IV SOLR
40.0000 mg | Freq: Once | INTRAVENOUS | Status: AC
  Administered 2012-01-08: 40 mg via INTRAVENOUS

## 2012-01-08 MED ORDER — IOHEXOL 300 MG/ML  SOLN
100.0000 mL | Freq: Once | INTRAMUSCULAR | Status: AC | PRN
  Administered 2012-01-08: 80 mL via INTRAVENOUS

## 2012-01-08 MED ORDER — SODIUM CHLORIDE 0.9 % IV BOLUS (SEPSIS)
1000.0000 mL | Freq: Once | INTRAVENOUS | Status: AC
  Administered 2012-01-08: 1000 mL via INTRAVENOUS

## 2012-01-08 MED ORDER — IOHEXOL 300 MG/ML  SOLN: 20.0000 mL | INTRAMUSCULAR | Status: AC

## 2012-01-08 MED ORDER — ONDANSETRON HCL 4 MG/2ML IJ SOLN
4.0000 mg | Freq: Once | INTRAMUSCULAR | Status: AC
  Administered 2012-01-08: 4 mg via INTRAVENOUS

## 2012-01-08 MED ORDER — FENTANYL CITRATE 0.05 MG/ML IJ SOLN
INTRAMUSCULAR | Status: AC
  Filled 2012-01-08: qty 2

## 2012-01-08 MED ORDER — OMEPRAZOLE 20 MG PO CPDR: 40.0000 mg | DELAYED_RELEASE_CAPSULE | Freq: Every day | ORAL | Status: DC

## 2012-01-08 MED ORDER — FENTANYL CITRATE 0.05 MG/ML IJ SOLN
50.0000 ug | Freq: Once | INTRAMUSCULAR | Status: AC
  Administered 2012-01-08: 50 ug via INTRAVENOUS
  Filled 2012-01-08: qty 2

## 2012-01-08 MED ORDER — FENTANYL CITRATE 0.05 MG/ML IJ SOLN
50.0000 ug | Freq: Once | INTRAMUSCULAR | Status: AC
  Administered 2012-01-08: 50 ug via INTRAVENOUS

## 2012-01-08 NOTE — ED Notes (Signed)
Pt in with c/o N V and hematemesis since Tuesday.

## 2012-01-08 NOTE — Discharge Instructions (Signed)
Gastritis Gastritis is an inflammation (the body's way of reacting to injury and/or infection) of the stomach. It is often caused by viral or bacterial (germ) infections. It can also be caused by chemicals (including alcohol) and medications. This illness may be associated with generalized malaise (feeling tired, not well), cramps, and fever. The illness may last 2 to 7 days. If symptoms of gastritis continue, gastroscopy (looking into the stomach with a telescope-like instrument), biopsy (taking tissue samples), and/or blood tests may be necessary to determine the cause. Antibiotics will not affect the illness unless there is a bacterial infection present. One common bacterial cause of gastritis is an organism known as H. Pylori. This can be treated with antibiotics. Other forms of gastritis are caused by too much acid in the stomach. They can be treated with medications such as H2 blockers and antacids. Home treatment is usually all that is needed. Young children will quickly become dehydrated (loss of body fluids) if vomiting and diarrhea are both present. Medications may be given to control nausea. Medications are usually not given for diarrhea unless especially bothersome. Some medications slow the removal of the virus from the gastrointestinal tract. This slows down the healing process. HOME CARE INSTRUCTIONS Home care instructions for nausea and vomiting:  For adults: drink small amounts of fluids often. Drink at least 2 quarts a day. Take sips frequently. Do not drink large amounts of fluid at one time. This may worsen the nausea.   Only take over-the-counter or prescription medicines for pain, discomfort, or fever as directed by your caregiver.   Drink clear liquids only. Those are anything you can see through such as water, broth, or soft drinks.   Once you are keeping clear liquids down, you may start full liquids, soups, juices, and ice cream or sherbet. Slowly add bland (plain, not spicy)  foods to your diet.  Home care instructions for diarrhea:  Diarrhea can be caused by bacterial infections or a virus. Your condition should improve with time, rest, fluids, and/or anti-diarrheal medication.   Until your diarrhea is under control, you should drink clear liquids often in small amounts. Clear liquids include: water, broth, jell-o water and weak tea.  Avoid:  Milk.   Fruits.   Tobacco.   Alcohol.   Extremely hot or cold fluids.   Too much intake of anything at one time.  When your diarrhea stops you may add the following foods, which help the stool to become more formed:  Rice.   Bananas.   Apples without skin.   Dry toast.  Once these foods are tolerated you may add low-fat yogurt and low-fat cottage cheese. They will help to restore the normal bacterial balance in your bowel. Wash your hands well to avoid spreading bacteria (germ) or virus. SEEK IMMEDIATE MEDICAL CARE IF:   You are unable to keep fluids down.   Vomiting or diarrhea become persistent (constant).   Abdominal pain develops, increases, or localizes. (Right sided pain can be appendicitis. Left sided pain in adults can be diverticulitis.)   You develop a fever (an oral temperature above 102 F (38.9 C)).   Diarrhea becomes excessive or contains blood or mucus.   You have excessive weakness, dizziness, fainting or extreme thirst.   You are not improving or you are getting worse.   You have any other questions or concerns.  Document Released: 07/08/2001 Document Revised: 07/03/2011 Document Reviewed: 07/14/2005 [facility] Patient Information 2012 [place].  B.R.A.T. Diet Your doctor has recommended the B.R.A.T.  diet for you or your child until the condition improves. This is often used to help control diarrhea and vomiting symptoms. If you or your child can tolerate clear liquids, you may have:  Bananas.   Rice.   Applesauce.   Toast (and other simple starches such as crackers,  potatoes, noodles).  Be sure to avoid dairy products, meats, and fatty foods until symptoms are better. Fruit juices such as apple, grape, and prune juice can make diarrhea worse. Avoid these. Continue this diet for 2 days or as instructed by your caregiver. Document Released: 07/14/2005 Document Revised: 07/03/2011 Document Reviewed: 12/31/2006 [facility] Patient Information 2012 [place].

## 2012-01-08 NOTE — ED Provider Notes (Addendum)
History     CSN: [id number]  Arrival date & time 01/08/12  1616   First MD Initiated Contact with Patient 01/08/12 1628      Chief Complaint  Patient presents with  . Nausea  . Emesis  . Hematemesis    (Consider location/radiation/quality/duration/timing/severity/associated sxs/prior treatment) HPI Comments: Patient states she went to have an injection in her back for chronic back pain 2 days ago.  That day she had some nausea and vomiting and the nurse told her that there was some blood in her emesis.  Patient believes it was only small streaks of blood and there is no gross clots.  Patient notes since that time she's had some persistent headache, epigastric pain and nausea and vomiting.  Patient has not noted any further blood in her emesis.  She's had some more frequent stools and has not noted any black or bloody stools.  She denies fevers.  She denies chest pain or shortness of breath.  She denies prior history of GI bleeding.  Patient has been trying goody powder for her pain but has not had relief.  Patient is a 66 y.o. female presenting with vomiting. The history is provided by the patient.  Emesis  This is a new problem. The current episode started 2 days ago. Associated symptoms include abdominal pain and headaches. Pertinent negatives include no chills, no cough, no diarrhea and no fever.    Past Medical History  Diagnosis Date  . CAD (coronary artery disease) 02/2009    S/P anterior MI  . Hyperlipemia   . Benign neoplasm of colon   . Unspecified mastoiditis   . Irritable bowel syndrome   . Hypertension   . Diverticulosis   . Colon polyp   . Ischemic cardiomyopathy     EF 25%  . Chronic systolic dysfunction of left ventricle   . Pulmonary embolism   . Chronic renal insufficiency   . Myocardial infarct     Past Surgical History  Procedure Date  . Total abdominal hysterectomy   . Bilateral salpingoophorectomy   . Inner ear surgery     left x 17  . Cervical  spine surgery 08/08  . Lumbar disc surgery 08/09  . Cad( bare metal stent) 8/10  . Angioplasty 07/02/09, 04/01/10  . Cardiac defibrillator placement 08/05/11    Primary prevention SJM ICD implanted,  Analyze ST study patient    Family History  Problem Relation Age of Onset  . Colon cancer Mother   . Colon cancer Brother   . Cancer Brother     Bladder  . Breast cancer Cousin     History  Substance Use Topics  . Smoking status: Former Smoker    Quit date: 04/27/2009  . Smokeless tobacco: Not on file  . Alcohol Use: No    OB History    Grav Para Term Preterm Abortions TAB SAB Ect Mult Living                  Review of Systems  Constitutional: Negative.  Negative for fever and chills.  Eyes: Negative.   Respiratory: Negative.  Negative for cough and shortness of breath.   Cardiovascular: Negative.  Negative for chest pain.  Gastrointestinal: Positive for nausea, vomiting and abdominal pain. Negative for diarrhea.  Genitourinary: Negative.  Negative for dysuria and vaginal discharge.  Musculoskeletal: Positive for back pain.  Skin: Negative.  Negative for color change and rash.  Neurological: Positive for headaches. Negative for syncope.  Hematological: Negative.  Negative for adenopathy.  Psychiatric/Behavioral: Negative.  Negative for confusion.  All other systems reviewed and are negative.    Allergies  Sulfonamide derivatives  Home Medications   Current Outpatient Rx  Name Route Sig Dispense Refill  . ALBUTEROL SULFATE HFA 108 (90 BASE) MCG/ACT IN AERS Inhalation Inhale 2 puffs into the lungs every 6 (six) hours as needed. For shortness of breath    . AMITRIPTYLINE HCL 50 MG PO TABS Oral Take 50 mg by mouth at bedtime.     . ASPIRIN 81 MG PO TABS Oral Take 81 mg by mouth daily.     Marland Kitchen BISOPROLOL FUMARATE 10 MG PO TABS Oral Take 5 mg by mouth daily. Take 1 1/2 tablets daily    . VITAMIN D 2000 UNITS PO TABS Oral Take 2,000 Units by mouth 2 (two) times daily.     .  FUROSEMIDE 40 MG PO TABS Oral Take 40 mg by mouth daily.     Marland Kitchen HYDROCODONE-ACETAMINOPHEN 5-500 MG PO TABS Oral Take 1 tablet by mouth every 6 (six) hours as needed. BACK PAIN    . LISINOPRIL 10 MG PO TABS Oral Take 10 mg by mouth daily.     Marland Kitchen PANTOPRAZOLE SODIUM 40 MG PO TBEC Oral Take 40 mg by mouth daily.     Marland Kitchen ROSUVASTATIN CALCIUM 20 MG PO TABS Oral Take 20 mg by mouth at bedtime.      . TOPIRAMATE 25 MG PO TABS Oral Take 25 mg by mouth at bedtime.       Wt 168 lb (76.204 kg)  Physical Exam  Nursing note and vitals reviewed. Constitutional: She is oriented to person, place, and time. She appears well-developed and well-nourished.  Non-toxic appearance. She does not have a sickly appearance.  HENT:  Head: Normocephalic and atraumatic.  Eyes: Conjunctivae, EOM and lids are normal. Pupils are equal, round, and reactive to light. No scleral icterus.  Neck: Trachea normal and normal range of motion. Neck supple.  Cardiovascular: Regular rhythm, S1 normal, S2 normal and normal heart sounds.  Tachycardia present.   Pulmonary/Chest: Effort normal and breath sounds normal. No respiratory distress. She has no wheezes. She has no rales.  Abdominal: Soft. Normal appearance. There is no tenderness. There is no rebound, no guarding and no CVA tenderness.  Genitourinary:       Examination chaperoned by Seth Bake, RN.  Pt with normal rectal tone.  Brown stool.  Hemoccult card sent to lab.    Musculoskeletal: Normal range of motion.  Neurological: She is alert and oriented to person, place, and time. She has normal strength.  Skin: Skin is warm, dry and intact. No rash noted.  Psychiatric: She has a normal mood and affect. Her behavior is normal. Judgment and thought content normal.    ED Course  Procedures (including critical care time)  Results for orders placed during the hospital encounter of 01/08/12  CBC      Component Value Range   WBC 11.2 (*) 4.0 - 10.5 K/uL   RBC 4.60  3.87 - 5.11  MIL/uL   Hemoglobin 15.4 (*) 12.0 - 15.0 g/dL   HCT 43.6  36.0 - 46.0 %   MCV 94.8  78.0 - 100.0 fL   MCH 33.5  26.0 - 34.0 pg   MCHC 35.3  30.0 - 36.0 g/dL   RDW 14.1  11.5 - 15.5 %   Platelets 305  150 - 400 K/uL  DIFFERENTIAL      Component Value Range  Neutrophils Relative 75  43 - 77 %   Neutro Abs 8.4 (*) 1.7 - 7.7 K/uL   Lymphocytes Relative 17  12 - 46 %   Lymphs Abs 1.9  0.7 - 4.0 K/uL   Monocytes Relative 8  3 - 12 %   Monocytes Absolute 0.8  0.1 - 1.0 K/uL   Eosinophils Relative 0  0 - 5 %   Eosinophils Absolute 0.0  0.0 - 0.7 K/uL   Basophils Relative 0  0 - 1 %   Basophils Absolute 0.0  0.0 - 0.1 K/uL  COMPREHENSIVE METABOLIC PANEL      Component Value Range   Sodium 137  135 - 145 mEq/L   Potassium 4.2  3.5 - 5.1 mEq/L   Chloride 105  96 - 112 mEq/L   CO2 18 (*) 19 - 32 mEq/L   Glucose, Bld 103 (*) 70 - 99 mg/dL   BUN 36 (*) 6 - 23 mg/dL   Creatinine, Ser 1.40 (*) 0.50 - 1.10 mg/dL   Calcium 10.0  8.4 - 10.5 mg/dL   Total Protein 8.0  6.0 - 8.3 g/dL   Albumin 4.3  3.5 - 5.2 g/dL   AST 21  0 - 37 U/L   ALT 15  0 - 35 U/L   Alkaline Phosphatase 98  39 - 117 U/L   Total Bilirubin 0.5  0.3 - 1.2 mg/dL   GFR calc non Af Amer 39 (*) >90 mL/min   GFR calc Af Amer 45 (*) >90 mL/min  LIPASE, BLOOD      Component Value Range   Lipase 72 (*) 11 - 59 U/L  APTT      Component Value Range   aPTT 27  24 - 37 seconds  PROTIME-INR      Component Value Range   Prothrombin Time 13.3  11.6 - 15.2 seconds   INR 0.99  0.00 - 1.49  TROPONIN I      Component Value Range   Troponin I <0.30  <0.30 ng/mL   US Abdomen Complete  01/08/2012  *RADIOLOGY REPORT*  Clinical Data:  Right upper quadrant pain  COMPLETE ABDOMINAL ULTRASOUND  Comparison:  CT scan 04/09/2010  Findings:  Gallbladder:  No gallstones, gallbladder wall thickening, or pericholecystic fluid. No sonographic Murphy's sign  Common bile duct:  Measures 2.3 mm in diameter within normal limits.  Liver:  No focal  lesion identified. Normal echogenicity.  No intrahepatic biliary ductal dilatation.  IVC: Limited assessment due to bowel gas.  Pancreas:  Limited assessment due to bowel gas.  Spleen:  Measures 5.8 cm in length. Heterogeneous echogenicity without focal lesion.  Right Kidney:  Measures 8.9 cm in length.  No mass, hydronephrosis or diagnostic renal calculus  Left Kidney:  Measures 8.7 cm in length.  No mass, hydronephrosis or diagnostic renal calculus  Abdominal aorta:  No aneurysm identified. Limited visualization due to abundant bowel gas.  Aorta measures up to 2.8 cm in diameter.  IMPRESSION: Negative abdominal ultrasound.  Original Report Authenticated By: Lahoma Crocker, M.D.       Date: 01/08/2012  Rate: 108  Rhythm: sinus tachycardia  QRS Axis: left  Intervals: normal  ST/T Wave abnormalities: T waves flattened in I and inverted in avL  Conduction Disutrbances:left anterior fascicular block  Narrative Interpretation:   Old EKG Reviewed: unchanged except rate of 69 on 08-06-11   MDM  Patient with headache and epigastric pain with associated nausea and vomiting.  Patient does not appear to have  continued hematemesis by her history.  Would consider possible pancreatitis or cholecystitis this causes as well as possible atypical presentation of cardiac ischemia.     Lezlie Octave, MD 01/08/12 1643  Patient with mild elevation in her lipase which appears to be the cause of her pain and nausea.  Patient did have an ultrasound to look for signs of gallstones that could be causing pancreatitis but there is no gallstones or other cholecystitis signs.  Abdominal ultrasound was normal.  There is no significant LFT changes.  Patient's epigastric pain is improving with the protonic since sentinel but she has had some persistent nausea which we've given her repeat dose of Zofran for it.  I will obtain a CT to further evaluate this since this is a new process for this patient.  Lezlie Octave,  MD 01/08/12 1834  Lezlie Octave, MD 01/08/12 1834  Patient's CT has returned and does not show any acute signs of pancreatitis but does show gastric wall thickening concerning for possible gastritis.  This could also be consistent with the patient's symptoms of epigastric pain and nausea.  Patient only has mild elevation in her lipase which appeared with a normal CT scan goes against significant pancreatitis as a cause.  Patient has not had further emesis here in the emergency department.  She is noting pain improvement.  She is able to tolerate the by mouth contrast for the CT scan.  Given patient's symptom improvement without further acute intra-abdominal pathology if the patient is safe for discharge home with a PPI and bland diet with liquids only initially.  Lezlie Octave, MD 01/08/12 2142

## 2012-02-20 ENCOUNTER — Telehealth: Payer: Self-pay | Admitting: Cardiology

## 2012-02-20 NOTE — Telephone Encounter (Signed)
Spoke with pt, she received a call from the [facility] device people and does not have the number to call them back. Per amber the pt is to call [phone] and ask for the [facility] department.

## 2012-02-20 NOTE — Telephone Encounter (Signed)
Patient returning nurse call she can be reached at hm#

## 2012-03-04 ENCOUNTER — Encounter: Payer: Medicare Other | Admitting: *Deleted

## 2012-03-08 ENCOUNTER — Encounter: Payer: Self-pay | Admitting: *Deleted

## 2012-03-10 ENCOUNTER — Other Ambulatory Visit: Payer: Self-pay | Admitting: Neurology

## 2012-03-10 DIAGNOSIS — R51 Headache: Secondary | ICD-10-CM

## 2012-03-17 ENCOUNTER — Ambulatory Visit (INDEPENDENT_AMBULATORY_CARE_PROVIDER_SITE_OTHER): Payer: Medicare Other | Admitting: Cardiology

## 2012-03-17 ENCOUNTER — Encounter: Payer: Self-pay | Admitting: Cardiology

## 2012-03-17 VITALS — BP 104/72 | HR 72 | Ht 62.0 in | Wt 169.0 lb

## 2012-03-17 DIAGNOSIS — IMO0001 Reserved for inherently not codable concepts without codable children: Secondary | ICD-10-CM | POA: Insufficient documentation

## 2012-03-17 DIAGNOSIS — I255 Ischemic cardiomyopathy: Secondary | ICD-10-CM

## 2012-03-17 DIAGNOSIS — R61 Generalized hyperhidrosis: Secondary | ICD-10-CM

## 2012-03-17 DIAGNOSIS — I2589 Other forms of chronic ischemic heart disease: Secondary | ICD-10-CM

## 2012-03-17 DIAGNOSIS — I2510 Atherosclerotic heart disease of native coronary artery without angina pectoris: Secondary | ICD-10-CM

## 2012-03-17 LAB — BASIC METABOLIC PANEL
CO2: 25 mEq/L (ref 19–32)
Chloride: 104 mEq/L (ref 96–112)
Creatinine, Ser: 1.6 mg/dL — ABNORMAL HIGH (ref 0.4–1.2)

## 2012-03-17 MED ORDER — ROSUVASTATIN CALCIUM 20 MG PO TABS: 20.0000 mg | ORAL_TABLET | Freq: Every day | ORAL | Status: DC

## 2012-03-17 MED ORDER — BISOPROLOL FUMARATE 10 MG PO TABS: ORAL_TABLET | ORAL | Status: DC

## 2012-03-17 MED ORDER — LISINOPRIL 10 MG PO TABS: 10.0000 mg | ORAL_TABLET | Freq: Every day | ORAL | Status: DC

## 2012-03-17 NOTE — Progress Notes (Signed)
HPI:  The patient returns today for follow up. Overall she is really doing pretty well. She denies chest pain or shortness of breath. Her migraines were really bad, and she was recently placed on Depakote. In addition, the patient said that Dr. love told her that there was a problem with her kidneys. We do not have the information from that visit. She does note that her hair gets wet and she sometimes starts sweating when she starts moving around in the morning. This doesn't happen regularly, but does have occasional basis. She has not used Vicodin in nearly a week. She's not see a gynecologist.  Current Outpatient Prescriptions  Medication Sig Dispense Refill  . amitriptyline (ELAVIL) 50 MG tablet Take 50 mg by mouth at bedtime.       Marland Kitchen aspirin 81 MG tablet Take 81 mg by mouth daily.       . bisoprolol (ZEBETA) 10 MG tablet Take 10 mg by mouth. Taking 1.5 Tablets Daily      . divalproex (DEPAKOTE ER) 250 MG 24 hr tablet Take 250 mg by mouth 2 (two) times daily.       Marland Kitchen esomeprazole (NEXIUM) 40 MG capsule Take 40 mg by mouth daily before breakfast.       . HYDROcodone-acetaminophen (VICODIN) 5-500 MG per tablet Take 1 tablet by mouth every 6 (six) hours as needed. BACK PAIN      . lisinopril (PRINIVIL,ZESTRIL) 10 MG tablet Take 10 mg by mouth daily.       . polyethylene glycol powder (GLYCOLAX/MIRALAX) powder Take 17 g by mouth daily.       . rosuvastatin (CRESTOR) 20 MG tablet Take 20 mg by mouth at bedtime.       Marland Kitchen albuterol (PROAIR HFA) 108 (90 BASE) MCG/ACT inhaler Inhale 2 puffs into the lungs every 6 (six) hours as needed. For shortness of breath        Allergies  Allergen Reactions  . Sulfonamide Derivatives Other (See Comments)    UNSURE    Past Medical History  Diagnosis Date  . CAD (coronary artery disease) 02/2009    S/P anterior MI  . Hyperlipemia   . Benign neoplasm of colon   . Unspecified mastoiditis   . Irritable bowel syndrome   . Hypertension   . Diverticulosis     . Colon polyp   . Ischemic cardiomyopathy     EF 25%  . Chronic systolic dysfunction of left ventricle   . Pulmonary embolism   . Chronic renal insufficiency   . Myocardial infarct     Past Surgical History  Procedure Date  . Total abdominal hysterectomy   . Bilateral salpingoophorectomy   . Inner ear surgery     left x 17  . Cervical spine surgery 08/08  . Lumbar disc surgery 08/09  . Cad( bare metal stent) 8/10  . Angioplasty 07/02/09, 04/01/10  . Cardiac defibrillator placement 08/05/11    Primary prevention SJM ICD implanted,  Analyze ST study patient    Family History  Problem Relation Age of Onset  . Colon cancer Mother   . Colon cancer Brother   . Cancer Brother     Bladder  . Breast cancer Cousin     History   Social History  . Marital Status: Divorced    Spouse Name: N/A    Number of Children: N/A  . Years of Education: N/A   Occupational History  . Unemployed    Social History Main Topics  .  Smoking status: Former Smoker    Quit date: 04/27/2009  . Smokeless tobacco: Not on file  . Alcohol Use: No  . Drug Use: No  . Sexually Active: Not on file   Other Topics Concern  . Not on file   Social History Narrative   Pt lives in [place] alone.  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  All other systems reviewed and negative.  PHYSICAL EXAM:  BP 104/72  Pulse 72  Ht 5\' 2"  (1.575 m)  Wt 169 lb (76.658 kg)  BMI 30.91 kg/m2  General: Well developed, well nourished, in no acute distress. Head:  Normocephalic and atraumatic. Neck: no JVD Lungs: Clear to auscultation and percussion. Heart: Normal S1 and S2.  No murmur, rubs or gallops.  Pulses: Pulses normal in all 4 extremities. Extremities: No clubbing or cyanosis. No edema. Neurologic: Alert and oriented x 3.  EKG:NSR.  Anterior MI, age indeterminate.  LAFB.  LVH.    ASSESSMENT AND PLAN:

## 2012-03-17 NOTE — Patient Instructions (Signed)
Your physician recommends that you schedule a follow-up appointment in: 3-4 WEEKS with Dr [name]  Your physician recommends that you have lab work today: [facility]  Your physician recommends that you continue on your current medications as directed. Please refer to the Current Medication list given to you today.

## 2012-03-17 NOTE — Assessment & Plan Note (Signed)
Not sure of the cause of this.  It is new, but has occurred previously.  I will see her back promptly.  We will check her renal function, and also get a CBC given the prior anemia episode.

## 2012-03-17 NOTE — Assessment & Plan Note (Signed)
Appears to be stable.  Does have episodes of some sweating, and she has had these in the past.  She has no other cardiac type symptoms.  I will see her back in four weeks, and she is to report if any of this is worse.

## 2012-03-19 ENCOUNTER — Telehealth: Payer: Self-pay | Admitting: *Deleted

## 2012-03-19 NOTE — Telephone Encounter (Signed)
Message copied by Michae Kava on Fri Mar 19, 2012  8:13 AM ------      Message from: Bing Quarry D      Created: Fri Mar 19, 2012  6:43 AM       Let her know that these look ok.  Thanks.  TS

## 2012-03-19 NOTE — Telephone Encounter (Signed)
pt notified of lab results today and gave verbal understanding

## 2012-03-25 ENCOUNTER — Ambulatory Visit (INDEPENDENT_AMBULATORY_CARE_PROVIDER_SITE_OTHER): Payer: Medicare Other | Admitting: *Deleted

## 2012-03-25 DIAGNOSIS — R0989 Other specified symptoms and signs involving the circulatory and respiratory systems: Secondary | ICD-10-CM

## 2012-04-05 ENCOUNTER — Emergency Department (HOSPITAL_COMMUNITY): Payer: Medicare Other

## 2012-04-05 ENCOUNTER — Encounter (HOSPITAL_COMMUNITY): Payer: Self-pay

## 2012-04-05 ENCOUNTER — Emergency Department (HOSPITAL_COMMUNITY)
Admission: EM | Admit: 2012-04-05 | Discharge: 2012-04-05 | Disposition: A | Discharge status: Home / Self Care | Payer: Medicare Other | Attending: Emergency Medicine | Admitting: Emergency Medicine

## 2012-04-05 ENCOUNTER — Ambulatory Visit (INDEPENDENT_AMBULATORY_CARE_PROVIDER_SITE_OTHER): Payer: Medicare Other | Admitting: Cardiology

## 2012-04-05 ENCOUNTER — Encounter: Payer: Self-pay | Admitting: Cardiology

## 2012-04-05 VITALS — BP 120/82 | HR 100 | Ht 63.0 in | Wt 163.0 lb

## 2012-04-05 DIAGNOSIS — Z7982 Long term (current) use of aspirin: Secondary | ICD-10-CM | POA: Insufficient documentation

## 2012-04-05 DIAGNOSIS — R05 Cough: Secondary | ICD-10-CM

## 2012-04-05 DIAGNOSIS — Z882 Allergy status to sulfonamides status: Secondary | ICD-10-CM | POA: Insufficient documentation

## 2012-04-05 DIAGNOSIS — J209 Acute bronchitis, unspecified: Secondary | ICD-10-CM | POA: Insufficient documentation

## 2012-04-05 DIAGNOSIS — Z87891 Personal history of nicotine dependence: Secondary | ICD-10-CM | POA: Insufficient documentation

## 2012-04-05 DIAGNOSIS — I2510 Atherosclerotic heart disease of native coronary artery without angina pectoris: Secondary | ICD-10-CM | POA: Insufficient documentation

## 2012-04-05 DIAGNOSIS — Z800 Family history of malignant neoplasm of digestive organs: Secondary | ICD-10-CM | POA: Insufficient documentation

## 2012-04-05 DIAGNOSIS — Z8052 Family history of malignant neoplasm of bladder: Secondary | ICD-10-CM | POA: Insufficient documentation

## 2012-04-05 DIAGNOSIS — I10 Essential (primary) hypertension: Secondary | ICD-10-CM | POA: Insufficient documentation

## 2012-04-05 DIAGNOSIS — Z803 Family history of malignant neoplasm of breast: Secondary | ICD-10-CM | POA: Insufficient documentation

## 2012-04-05 DIAGNOSIS — K5730 Diverticulosis of large intestine without perforation or abscess without bleeding: Secondary | ICD-10-CM | POA: Insufficient documentation

## 2012-04-05 LAB — CBC WITH DIFFERENTIAL/PLATELET
Basophils Absolute: 0 10*3/uL (ref 0.0–0.1)
Basophils Relative: 0 % (ref 0–1)
HCT: 41.2 % (ref 36.0–46.0)
Hemoglobin: 13.7 g/dL (ref 12.0–15.0)
Lymphocytes Relative: 32 % (ref 12–46)
MCHC: 33.3 g/dL (ref 30.0–36.0)
Monocytes Absolute: 0.4 10*3/uL (ref 0.1–1.0)
Monocytes Relative: 6 % (ref 3–12)
Neutro Abs: 3.6 10*3/uL (ref 1.7–7.7)
Neutrophils Relative %: 59 % (ref 43–77)
RDW: 13.2 % (ref 11.5–15.5)
WBC: 6.2 10*3/uL (ref 4.0–10.5)

## 2012-04-05 LAB — COMPREHENSIVE METABOLIC PANEL
AST: 17 U/L (ref 0–37)
Albumin: 3.9 g/dL (ref 3.5–5.2)
Alkaline Phosphatase: 89 U/L (ref 39–117)
CO2: 25 mEq/L (ref 19–32)
Chloride: 104 mEq/L (ref 96–112)
GFR calc non Af Amer: 46 mL/min — ABNORMAL LOW (ref >90)
Potassium: 4.2 mEq/L (ref 3.5–5.1)
Total Bilirubin: 0.3 mg/dL (ref 0.3–1.2)

## 2012-04-05 MED ORDER — HYDROCOD POLST-CHLORPHEN POLST 10-8 MG/5ML PO LQCR: 5.0000 mL | Freq: Two times a day (BID) | ORAL | Status: DC | PRN

## 2012-04-05 MED ORDER — HYDROCOD POLST-CHLORPHEN POLST 10-8 MG/5ML PO LQCR
5.0000 mL | Freq: Once | ORAL | Status: AC
  Administered 2012-04-05: 5 mL via ORAL
  Filled 2012-04-05: qty 5

## 2012-04-05 MED ORDER — AZITHROMYCIN 250 MG PO TABS: ORAL_TABLET | ORAL | Status: DC

## 2012-04-05 NOTE — Patient Instructions (Signed)
Your physician recommends that you schedule a follow-up appointment in: [place]  Transported patient to the hospital

## 2012-04-05 NOTE — ED Provider Notes (Signed)
History     CSN: [id number]  Arrival date & time 04/05/12  1308   First MD Initiated Contact with Patient 04/05/12 1350      Chief Complaint  Patient presents with  . Hemoptysis    (Consider location/radiation/quality/duration/timing/severity/associated sxs/prior treatment) HPI Comments: Pt is a 66 year old woman who says that she has been coughing up blood.  Her cough started last week.  She denies fever.  She does not smoke cigarettes.  She has had no exposure to others with cough or cold.  She had been seen earlier today at [facility] and was sent to [facility] ED for evaluation.  Patient is a 66 y.o. female presenting with cough. The history is provided by the patient and medical records. No language interpreter was used.  Cough This is a new problem. The current episode started more than 1 week ago. The problem occurs constantly. The problem has been gradually worsening. The cough is productive of blood-tinged sputum. There has been no fever. Pertinent negatives include no chills. She has tried nothing for the symptoms. She is not a smoker.    Past Medical History  Diagnosis Date  . CAD (coronary artery disease) 02/2009    S/P anterior MI  . Hyperlipemia   . Benign neoplasm of colon   . Unspecified mastoiditis   . Irritable bowel syndrome   . Hypertension   . Diverticulosis   . Colon polyp   . Ischemic cardiomyopathy     EF 25%  . Chronic systolic dysfunction of left ventricle   . Pulmonary embolism   . Chronic renal insufficiency   . Myocardial infarct     Past Surgical History  Procedure Date  . Total abdominal hysterectomy   . Bilateral salpingoophorectomy   . Inner ear surgery     left x 17  . Cervical spine surgery 08/08  . Lumbar disc surgery 08/09  . Cad( bare metal stent) 8/10  . Angioplasty 07/02/09, 04/01/10  . Cardiac defibrillator placement 08/05/11    Primary prevention SJM ICD implanted,  Analyze ST study patient    Family History  Problem  Relation Age of Onset  . Colon cancer Mother   . Colon cancer Brother   . Cancer Brother     Bladder  . Breast cancer Cousin     History  Substance Use Topics  . Smoking status: Former Smoker    Quit date: 04/27/2009  . Smokeless tobacco: Not on file  . Alcohol Use: No    OB History    Grav Para Term Preterm Abortions TAB SAB Ect Mult Living                  Review of Systems  Constitutional: Negative for fever and chills.  HENT: Negative.   Eyes: Negative.   Respiratory: Positive for cough.   Cardiovascular: Negative.   Gastrointestinal: Negative.   Genitourinary: Negative.   Musculoskeletal: Negative.   Skin: Negative.   Neurological: Negative.   Psychiatric/Behavioral: Negative.     Allergies  Sulfonamide derivatives  Home Medications   Current Outpatient Rx  Name Route Sig Dispense Refill  . ALBUTEROL SULFATE HFA 108 (90 BASE) MCG/ACT IN AERS Inhalation Inhale 2 puffs into the lungs every 6 (six) hours as needed. For shortness of breath    . AMITRIPTYLINE HCL 50 MG PO TABS Oral Take 50 mg by mouth at bedtime.     . ASPIRIN 81 MG PO TABS Oral Take 81 mg by mouth daily.     Marland Kitchen  BISOPROLOL FUMARATE 10 MG PO TABS Oral Take 15 mg by mouth daily. Take one and one-half tablet by mouth daily    . DIVALPROEX SODIUM 250 MG PO TBEC Oral Take 250 mg by mouth 2 (two) times daily.    Marland Kitchen ESOMEPRAZOLE MAGNESIUM 40 MG PO CPDR Oral Take 40 mg by mouth daily as needed. For acid reflux    . HYDROCODONE-ACETAMINOPHEN 5-500 MG PO TABS Oral Take 1 tablet by mouth every 6 (six) hours as needed. BACK PAIN    . LISINOPRIL 10 MG PO TABS Oral Take 10 mg by mouth daily.    Marland Kitchen POLYETHYLENE GLYCOL 3350 PO POWD Oral Take 17 g by mouth daily as needed. For constipation    . ROSUVASTATIN CALCIUM 20 MG PO TABS Oral Take 20 mg by mouth at bedtime.      BP 157/101  Pulse 89  Temp 98.4 F (36.9 C) (Oral)  Resp 20  SpO2 97%  Physical Exam  Nursing note and vitals reviewed. Constitutional:  She is oriented to person, place, and time. She appears well-developed and well-nourished.       In moderate distress with paroxysms of hacking cough.  HENT:  Head: Normocephalic and atraumatic.  Right Ear: External ear normal.  Left Ear: External ear normal.  Eyes: Conjunctivae and EOM are normal. Pupils are equal, round, and reactive to light.  Neck: Normal range of motion. Neck supple.  Cardiovascular: Normal rate, regular rhythm and normal heart sounds.   Pulmonary/Chest: Effort normal and breath sounds normal.       AICD on left upper chest wall.  Abdominal: Soft. Bowel sounds are normal.  Musculoskeletal: Normal range of motion. She exhibits no edema and no tenderness.  Lymphadenopathy:    She has no cervical adenopathy.  Neurological: She is alert and oriented to person, place, and time.       No sensory or motor deficit.  Skin: Skin is warm and dry.  Psychiatric: She has a normal mood and affect. Her behavior is normal.    ED Course  Procedures (including critical care time)  Labs Reviewed  CBC WITH DIFFERENTIAL - Abnormal; Notable for the following:    MCV 100.5 (*)     All other components within normal limits  COMPREHENSIVE METABOLIC PANEL  URINALYSIS, ROUTINE W REFLEX MICROSCOPIC   2:44 PM  Date: 04/05/2012  Rate: 77  Rhythm: normal sinus rhythm  QRS Axis: left  Intervals: normal QRS:  biatrial abnormality; poor R wave progression in precordial leads suggests old anterior myocardial infarction.  Left ventricular hypertrophy.  ST/T Wave abnormalities: normal  Conduction Disutrbances:left anterior fascicular block  Narrative Interpretation: Abnormal EKG  Old EKG Reviewed: unchanged  4:08 PM Results for orders placed during the hospital encounter of 04/05/12  CBC WITH DIFFERENTIAL      Component Value Range   WBC 6.2  4.0 - 10.5 K/uL   RBC 4.10  3.87 - 5.11 MIL/uL   Hemoglobin 13.7  12.0 - 15.0 g/dL   HCT 41.2  36.0 - 46.0 %   MCV 100.5 (*) 78.0 - 100.0 fL     MCH 33.4  26.0 - 34.0 pg   MCHC 33.3  30.0 - 36.0 g/dL   RDW 13.2  11.5 - 15.5 %   Platelets 182  150 - 400 K/uL   Neutrophils Relative 59  43 - 77 %   Neutro Abs 3.6  1.7 - 7.7 K/uL   Lymphocytes Relative 32  12 - 46 %  Lymphs Abs 2.0  0.7 - 4.0 K/uL   Monocytes Relative 6  3 - 12 %   Monocytes Absolute 0.4  0.1 - 1.0 K/uL   Eosinophils Relative 3  0 - 5 %   Eosinophils Absolute 0.2  0.0 - 0.7 K/uL   Basophils Relative 0  0 - 1 %   Basophils Absolute 0.0  0.0 - 0.1 K/uL  COMPREHENSIVE METABOLIC PANEL      Component Value Range   Sodium 141  135 - 145 mEq/L   Potassium 4.2  3.5 - 5.1 mEq/L   Chloride 104  96 - 112 mEq/L   CO2 25  19 - 32 mEq/L   Glucose, Bld 95  70 - 99 mg/dL   BUN 31 (*) 6 - 23 mg/dL   Creatinine, Ser 1.22 (*) 0.50 - 1.10 mg/dL   Calcium 9.6  8.4 - 10.5 mg/dL   Total Protein 7.3  6.0 - 8.3 g/dL   Albumin 3.9  3.5 - 5.2 g/dL   AST 17  0 - 37 U/L   ALT 13  0 - 35 U/L   Alkaline Phosphatase 89  39 - 117 U/L   Total Bilirubin 0.3  0.3 - 1.2 mg/dL   GFR calc non Af Amer 46 (*) >90 mL/min   GFR calc Af Amer 53 (*) >90 mL/min   Dg Chest 2 View  04/05/2012  *RADIOLOGY REPORT*  Clinical Data: Packing cough, bloody sputum, left chest soreness  CHEST - 2 VIEW  Comparison: Chest x-ray of 08/06/2011  Findings: No active infiltrate or effusion is seen.  Very mild peribronchial thickening is present.  Cardiomegaly is stable.  A single lead permanent pacemaker remains with AICD.  There are degenerative changes throughout the thoracic spine.  IMPRESSION: Stable cardiomegaly with pacer.  No active lung disease.  Mild peribronchial thickening.   Original Report Authenticated By: Joretta Bachelor, M.D.     Lab workup reassuringly negative. Cough has subsided after a dose of Tussionex.   Rx with Azithromycin, Tussionex.   1. Acute bronchitis            Mylinda Latina III, MD 04/06/12 1106

## 2012-04-05 NOTE — ED Notes (Signed)
Pt presents with cough that has been ongoing for 1 week.  Per [place] MD who brought pt in, pt is coughing up blood.  This RN does not observe any blood in tissue that pt is using to cover her mouth when coughing.  Per [place] MD, pt was treated for the same in the past and had a HGB of 3.

## 2012-04-05 NOTE — Progress Notes (Signed)
HPI: The patient returns in followup. She actually was going to see her primary care physician later today because of cough that is unrelenting. She hurts, and in the office today she continued to cough. The cough is productive of bloody sputum. She's felt sweaty, but it's not clear that she's had a fever. This is been going on for the past week or so. She denies typical cardiac chest pain.  Current Outpatient Prescriptions  Medication Sig Dispense Refill  . albuterol (PROAIR HFA) 108 (90 BASE) MCG/ACT inhaler Inhale 2 puffs into the lungs every 6 (six) hours as needed. For shortness of breath      . amitriptyline (ELAVIL) 50 MG tablet Take 50 mg by mouth at bedtime.       Marland Kitchen aspirin 81 MG tablet Take 81 mg by mouth daily.       . bisoprolol (ZEBETA) 10 MG tablet Take one and one-half tablet by mouth daily  45 tablet  6  . divalproex (DEPAKOTE) 250 MG DR tablet Take 250 mg by mouth 2 (two) times daily.      Marland Kitchen esomeprazole (NEXIUM) 40 MG capsule Take 40 mg by mouth daily before breakfast.       . HYDROcodone-acetaminophen (VICODIN) 5-500 MG per tablet Take 1 tablet by mouth every 6 (six) hours as needed. BACK PAIN      . lisinopril (PRINIVIL,ZESTRIL) 10 MG tablet Take 1 tablet (10 mg total) by mouth daily.  30 tablet  6  . polyethylene glycol powder (GLYCOLAX/MIRALAX) powder Take 17 g by mouth daily.       . rosuvastatin (CRESTOR) 20 MG tablet Take 1 tablet (20 mg total) by mouth at bedtime.  30 tablet  6    Allergies  Allergen Reactions  . Sulfonamide Derivatives Other (See Comments)    UNSURE    Past Medical History  Diagnosis Date  . CAD (coronary artery disease) 02/2009    S/P anterior MI  . Hyperlipemia   . Benign neoplasm of colon   . Unspecified mastoiditis   . Irritable bowel syndrome   . Hypertension   . Diverticulosis   . Colon polyp   . Ischemic cardiomyopathy     EF 25%  . Chronic systolic dysfunction of left ventricle   . Pulmonary embolism   . Chronic renal  insufficiency   . Myocardial infarct     Past Surgical History  Procedure Date  . Total abdominal hysterectomy   . Bilateral salpingoophorectomy   . Inner ear surgery     left x 17  . Cervical spine surgery 08/08  . Lumbar disc surgery 08/09  . Cad( bare metal stent) 8/10  . Angioplasty 07/02/09, 04/01/10  . Cardiac defibrillator placement 08/05/11    Primary prevention SJM ICD implanted,  Analyze ST study patient    Family History  Problem Relation Age of Onset  . Colon cancer Mother   . Colon cancer Brother   . Cancer Brother     Bladder  . Breast cancer Cousin     History   Social History  . Marital Status: Divorced    Spouse Name: N/A    Number of Children: N/A  . Years of Education: N/A   Occupational History  . Unemployed    Social History Main Topics  . Smoking status: Former Smoker    Quit date: 04/27/2009  . Smokeless tobacco: Not on file  . Alcohol Use: No  . Drug Use: No  . Sexually Active: Not on file  Other Topics Concern  . Not on file   Social History Narrative   Pt lives in [place] alone.  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  All other systems reviewed and negative.  PHYSICAL EXAM:  BP 120/82  Pulse 100  Ht 5\' 3"  (1.6 m)  Wt 163 lb (73.936 kg)  BMI 28.87 kg/m2  General: Well developed, well nourished, in no acute distress. Head:  Normocephalic and atraumatic. Neck: no JVD Lungs: Clear to auscultation and percussion. Heart: Normal S1 and S2.  No murmur, rubs or gallops.  Abdomen:  Normal bowel sounds; soft; non tender; no organomegaly Pulses: Pulses normal in all 4 extremities. Extremities: No clubbing or cyanosis. No edema. Neurologic: Alert and oriented x 3.  EKG:  NSR.  biaatrial enlargement.  IRBBB.  Left axis deviation.  Borderline IVCD.    ASSESSMENT AND PLAN:

## 2012-04-07 ENCOUNTER — Ambulatory Visit: Payer: Medicare Other | Admitting: Cardiology

## 2012-04-09 ENCOUNTER — Telehealth: Payer: Self-pay | Admitting: Cardiology

## 2012-04-09 NOTE — Telephone Encounter (Signed)
I spoke with the pt and she said her medical doctor did a different type of x-ray today and they just called her and told her it was abnormal (the pt could not explain what they found).  The pt has been scheduled to have another x-ray performed on Monday.  The pt will request that these reports be sent to Dr [name] for our records.

## 2012-04-09 NOTE — Telephone Encounter (Signed)
Pt calling to let dr [name] know about an xray she had and what was found, pls call

## 2012-04-11 DIAGNOSIS — R05 Cough: Secondary | ICD-10-CM | POA: Insufficient documentation

## 2012-04-11 NOTE — Assessment & Plan Note (Signed)
She has cough today which is extensive, and associated with some bloody sputum.  I think she needs a CXR, and she was going to see her primary, but given the hemoptysis it seems most appropriate to get this evaluated in the ED.  Notably, she has had PE in the past.

## 2012-04-11 NOTE — Assessment & Plan Note (Signed)
Symptoms seem stable, although hard to tell given current coughing in the clinic.  She is miserable here, and we will roll her over to the ER so she can be evaluated fully.

## 2012-04-14 ENCOUNTER — Telehealth: Payer: Self-pay | Admitting: Cardiology

## 2012-04-14 NOTE — Telephone Encounter (Signed)
Walk In pt Form " Pt Dropped of CD of x-Ray" placed On Lauren's Desk" 04/14/12/KM

## 2012-04-15 ENCOUNTER — Other Ambulatory Visit: Payer: Self-pay | Admitting: Gastroenterology

## 2012-04-15 MED ORDER — AMITRIPTYLINE HCL 50 MG PO TABS: 50.0000 mg | ORAL_TABLET | Freq: Every day | ORAL | Status: DC

## 2012-04-15 NOTE — Telephone Encounter (Signed)
ok 

## 2012-04-15 NOTE — Telephone Encounter (Signed)
Medication sent to pharmacy. Tried to contact pt but line was busy

## 2012-04-15 NOTE — Telephone Encounter (Signed)
DR Deatra Ina, PT WANTS A REFILL OF AMITRIPTYLINE

## 2012-04-19 ENCOUNTER — Encounter: Payer: Self-pay | Admitting: Cardiology

## 2012-04-19 NOTE — Telephone Encounter (Signed)
This encounter was created in error - please disregard.

## 2012-04-19 NOTE — Telephone Encounter (Signed)
F/u   Patient calling for f/u on this matter that will affect her appnt tomorrow. plz return call to patient at cell# [phone] ASAP.

## 2012-04-19 NOTE — Telephone Encounter (Signed)
Please return call to patient at hm# regarding xrays

## 2012-04-21 ENCOUNTER — Encounter: Payer: Self-pay | Admitting: Cardiothoracic Surgery

## 2012-04-21 ENCOUNTER — Institutional Professional Consult (permissible substitution) (INDEPENDENT_AMBULATORY_CARE_PROVIDER_SITE_OTHER): Payer: Medicare Other | Admitting: Cardiothoracic Surgery

## 2012-04-21 VITALS — BP 113/79 | HR 76 | Temp 98.5°F | Resp 18 | Ht 63.0 in | Wt 164.0 lb

## 2012-04-21 DIAGNOSIS — I10 Essential (primary) hypertension: Secondary | ICD-10-CM

## 2012-04-21 DIAGNOSIS — I719 Aortic aneurysm of unspecified site, without rupture: Secondary | ICD-10-CM

## 2012-04-21 DIAGNOSIS — I712 Thoracic aortic aneurysm, without rupture: Secondary | ICD-10-CM

## 2012-04-21 NOTE — Progress Notes (Signed)
PCP is Rachell Cipro, MD Referring Provider is Rachell Cipro, MD  Chief Complaint  Patient presents with  . Thoracic Aortic Aneurysm    Referral from Dr Ernie Hew for surgical eval on Aortic arch lesion,ductus diverticulum,  CTA Chest on 04/12/12    HPI: 66 year old Caucasian female ex-smoker with hypertension and peripheral rash or disease and coronary disease referred for evaluation of a recently noted abnormal CT scan finding. She developed bronchitis with some streaky hemoptysis and a chest CT scan was performed at [facility]. The inferior arch had calcification and a slight focal enlargement consistent with a ductus bump versus ulcer ulcerative plaque and focal pseudoaneurysm. This is asymptomatic. The patient has significant CAD with ischemic cardiomyopathy status post anterior MI in 2011 treated with a LAD stent but with subsequent EF 25%. She has an AICD. She is taking a beta blocker, Crestor, and aspirin.  Review o CT scan  going back to 2006 for various problems including most recently a pulmonary embolus in 2011 showed this area of the aorta to be diseased with slight increase in size of the pseudoaneurysm now measuring approximately 2 cm. There is no evidence of intramural hematoma dissection or surrounding hematoma.   Past Medical History  Diagnosis Date  . CAD (coronary artery disease) 02/2009    S/P anterior MI  . Hyperlipemia   . Benign neoplasm of colon   . Unspecified mastoiditis   . Irritable bowel syndrome   . Hypertension   . Diverticulosis   . Colon polyp   . Ischemic cardiomyopathy     EF 25%  . Chronic systolic dysfunction of left ventricle   . Pulmonary embolism   . Chronic renal insufficiency   . Myocardial infarct     Past Surgical History  Procedure Date  . Total abdominal hysterectomy   . Bilateral salpingoophorectomy   . Inner ear surgery     left x 17  . Cervical spine surgery 08/08  . Lumbar disc surgery 08/09  . Cad( bare metal stent) 8/10    . Angioplasty 07/02/09, 04/01/10  . Cardiac defibrillator placement 08/05/11    Primary prevention SJM ICD implanted,  Analyze ST study patient    Family History  Problem Relation Age of Onset  . Colon cancer Mother   . Colon cancer Brother   . Cancer Brother     Bladder  . Breast cancer Cousin     Social History History  Substance Use Topics  . Smoking status: Former Smoker    Quit date: 04/27/2009  . Smokeless tobacco: Not on file  . Alcohol Use: No    Current Outpatient Prescriptions  Medication Sig Dispense Refill  . ADVAIR DISKUS 500-50 MCG/DOSE AEPB Inhale 1 puff into the lungs 2 (two) times daily.       Marland Kitchen albuterol (PROAIR HFA) 108 (90 BASE) MCG/ACT inhaler Inhale 2 puffs into the lungs every 6 (six) hours as needed. For shortness of breath      . amitriptyline (ELAVIL) 50 MG tablet Take 1 tablet (50 mg total) by mouth at bedtime.  30 tablet  3  . aspirin 81 MG tablet Take 81 mg by mouth daily.       . bisoprolol (ZEBETA) 10 MG tablet Take 15 mg by mouth daily. Take one and one-half tablet by mouth daily      . chlorpheniramine-HYDROcodone (TUSSIONEX PENNKINETIC ER) 10-8 MG/5ML LQCR Take 5 mLs by mouth every 12 (twelve) hours as needed.  60 mL  0  . divalproex (DEPAKOTE)  250 MG DR tablet Take 250 mg by mouth 2 (two) times daily.      Marland Kitchen esomeprazole (NEXIUM) 40 MG capsule Take 40 mg by mouth daily as needed. For acid reflux      . gabapentin (NEURONTIN) 300 MG capsule Take 300 mg by mouth 3 (three) times daily.       Marland Kitchen HYDROcodone-acetaminophen (VICODIN) 5-500 MG per tablet Take 1 tablet by mouth every 6 (six) hours as needed. BACK PAIN      . lisinopril (PRINIVIL,ZESTRIL) 10 MG tablet Take 10 mg by mouth daily.      . montelukast (SINGULAIR) 10 MG tablet Take 10 mg by mouth at bedtime.       . polyethylene glycol powder (GLYCOLAX/MIRALAX) powder Take 17 g by mouth daily as needed. For constipation      . rosuvastatin (CRESTOR) 20 MG tablet Take 20 mg by mouth at bedtime.         Allergies  Allergen Reactions  . Sulfonamide Derivatives Other (See Comments)    UNSURE    Review of Systems severe coronary disease, ischemic cardiomyopathy                                  Heavy smoking history with COPD recent bronchitis with hemoptysis now resolved                                  History of pulmonary was him in 2011 of the right upper lobe, resolved                                  No history of diabetes BP 113/79  Pulse 76  Temp 98.5 F (36.9 C) (Oral)  Resp 18  Ht 5\' 3"  (1.6 m)  Wt 164 lb (74.39 kg)  BMI 29.05 kg/m2  SpO2 97% Physical Exam Alert and comfortable HEENT normocephalic Neck with pulses no no JVD Adenopathy nonpalpable in the neck Cardiac regular rhythm without murmur or gallop Abdomen no pulsatile mass Lungs clear breath sounds no deformity Vascular strong pulses all extremities Neurologic no focal motor deficit  Diagnostic Tests: CT scan of the chest performed earlier this month it trigt imaging reviewed. This is compared to several CT scans performed in the [facility]. The area of atherosclerotic ulcerated plaque extends back to 2006. The pseudoaneurysm is only slightly larger at this time. This would not require surgical intervention at this time and needs to be followed with serial scans. Patient understands the blood pressure control, cholesterol couldn't control and weight control her the best therapeutic preventive options.  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 Surgical intervention not be indicated unless this year become significantly larger. Risk for dissection is low.

## 2012-04-26 ENCOUNTER — Ambulatory Visit
Admission: RE | Admit: 2012-04-26 | Discharge: 2012-04-26 | Disposition: A | Discharge status: Home / Self Care | Payer: Medicare Other | Source: Ambulatory Visit | Attending: Neurology | Admitting: Neurology

## 2012-04-26 DIAGNOSIS — R51 Headache: Secondary | ICD-10-CM

## 2012-05-05 ENCOUNTER — Encounter: Payer: Self-pay | Admitting: Cardiology

## 2012-05-05 ENCOUNTER — Ambulatory Visit (INDEPENDENT_AMBULATORY_CARE_PROVIDER_SITE_OTHER): Payer: Medicare Other | Admitting: Cardiology

## 2012-05-05 VITALS — BP 130/80 | HR 98 | Resp 18 | Ht 63.0 in | Wt 166.8 lb

## 2012-05-05 DIAGNOSIS — I255 Ischemic cardiomyopathy: Secondary | ICD-10-CM

## 2012-05-05 DIAGNOSIS — I7122 Aneurysm of the aortic arch, without rupture: Secondary | ICD-10-CM

## 2012-05-05 DIAGNOSIS — E785 Hyperlipidemia, unspecified: Secondary | ICD-10-CM

## 2012-05-05 DIAGNOSIS — I2589 Other forms of chronic ischemic heart disease: Secondary | ICD-10-CM

## 2012-05-05 DIAGNOSIS — I712 Thoracic aortic aneurysm, without rupture: Secondary | ICD-10-CM

## 2012-05-05 DIAGNOSIS — I2510 Atherosclerotic heart disease of native coronary artery without angina pectoris: Secondary | ICD-10-CM

## 2012-05-05 DIAGNOSIS — I719 Aortic aneurysm of unspecified site, without rupture: Secondary | ICD-10-CM

## 2012-05-05 HISTORY — DX: Thoracic aortic aneurysm, without rupture: I71.2

## 2012-05-05 HISTORY — DX: Aneurysm of the aortic arch, without rupture: I71.22

## 2012-05-05 NOTE — Assessment & Plan Note (Signed)
She will be due for a lipid and liver profile on her next office visit.

## 2012-05-05 NOTE — Assessment & Plan Note (Signed)
She is not currently having recurrent angina.

## 2012-05-05 NOTE — Patient Instructions (Addendum)
Your physician recommends that you schedule a follow-up appointment in: 2 MONTHS  Your physician recommends that you continue on your current medications as directed. Please refer to the Current Medication list given to you today.   

## 2012-05-05 NOTE — Assessment & Plan Note (Signed)
She really looks quite good, and is hemodynamically stable. Her heart rate is a little fast, but she is tolerating her meds well. We will not make any changes today, I will see her back in close followup in 2 months.

## 2012-05-05 NOTE — Progress Notes (Signed)
HPI:  The patient is in the office today for a followup visit. From a cardiac standpoint she clearly is much better she is not coughing up blood. In the interim, she had CAT scan which again demonstrated a ulcer the transverse portion of her aortic arch. She's seeing Dr. Prescott Gum who has  recommended followup.  We've known about this for some time, and this is reflected in my note of early last year. Her cough is resolved she is feeling a lot better. She is no longer on Topamax. A recent CAT scan was unremarkable  Current Outpatient Prescriptions  Medication Sig Dispense Refill  . ADVAIR DISKUS 500-50 MCG/DOSE AEPB Inhale 1 puff into the lungs 2 (two) times daily.       Marland Kitchen albuterol (PROAIR HFA) 108 (90 BASE) MCG/ACT inhaler Inhale 2 puffs into the lungs every 6 (six) hours as needed. For shortness of breath      . amitriptyline (ELAVIL) 50 MG tablet Take 1 tablet (50 mg total) by mouth at bedtime.  30 tablet  3  . aspirin 81 MG tablet Take 81 mg by mouth daily.       . bisoprolol (ZEBETA) 10 MG tablet Take 15 mg by mouth daily. Take one and one-half tablet by mouth daily      . chlorpheniramine-HYDROcodone (TUSSIONEX PENNKINETIC ER) 10-8 MG/5ML LQCR Take 5 mLs by mouth every 12 (twelve) hours as needed.  60 mL  0  . divalproex (DEPAKOTE) 250 MG DR tablet Take 250 mg by mouth 2 (two) times daily.      Marland Kitchen esomeprazole (NEXIUM) 40 MG capsule Take 40 mg by mouth daily as needed. For acid reflux      . gabapentin (NEURONTIN) 300 MG capsule Take 300 mg by mouth 3 (three) times daily.       Marland Kitchen HYDROcodone-acetaminophen (VICODIN) 5-500 MG per tablet Take 1 tablet by mouth every 6 (six) hours as needed. BACK PAIN      . lisinopril (PRINIVIL,ZESTRIL) 10 MG tablet Take 10 mg by mouth daily.      . montelukast (SINGULAIR) 10 MG tablet Take 10 mg by mouth at bedtime.       . polyethylene glycol powder (GLYCOLAX/MIRALAX) powder Take 17 g by mouth daily as needed. For constipation      . rosuvastatin (CRESTOR)  20 MG tablet Take 20 mg by mouth at bedtime.        Allergies  Allergen Reactions  . Sulfonamide Derivatives Other (See Comments)    UNSURE    Past Medical History  Diagnosis Date  . CAD (coronary artery disease) 02/2009    S/P anterior MI  . Hyperlipemia   . Benign neoplasm of colon   . Unspecified mastoiditis   . Irritable bowel syndrome   . Hypertension   . Diverticulosis   . Colon polyp   . Ischemic cardiomyopathy     EF 25%  . Chronic systolic dysfunction of left ventricle   . Pulmonary embolism   . Chronic renal insufficiency   . Myocardial infarct     Past Surgical History  Procedure Date  . Total abdominal hysterectomy   . Bilateral salpingoophorectomy   . Inner ear surgery     left x 17  . Cervical spine surgery 08/08  . Lumbar disc surgery 08/09  . Cad( bare metal stent) 8/10  . Angioplasty 07/02/09, 04/01/10  . Cardiac defibrillator placement 08/05/11    Primary prevention SJM ICD implanted,  Analyze ST study patient  Family History  Problem Relation Age of Onset  . Colon cancer Mother   . Colon cancer Brother   . Cancer Brother     Bladder  . Breast cancer Cousin     History   Social History  . Marital Status: Divorced    Spouse Name: N/A    Number of Children: N/A  . Years of Education: N/A   Occupational History  . Unemployed    Social History Main Topics  . Smoking status: Former Smoker    Quit date: 04/27/2009  . Smokeless tobacco: Not on file  . Alcohol Use: No  . Drug Use: No  . Sexually Active: Not on file   Other Topics Concern  . Not on file   Social History Narrative   Pt lives in [place] alone.  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  All other systems reviewed and negative.  PHYSICAL EXAM:  BP 130/80  Pulse 98  Resp 18  Ht 5\' 3"  (1.6 m)  Wt 166 lb 12.8 oz (75.66 kg)  BMI 29.55 kg/m2  SpO2 95%  General: Well developed, well nourished, in no acute distress. Head:   Normocephalic and atraumatic. Neck: no JVD Lungs: Clear to auscultation and percussion. Heart: Normal S1 and S2.  No murmur, rubs or gallops.  Pulses: Pulses normal in all 4 extremities. Extremities: No clubbing or cyanosis. No edema. Neurologic: Alert and oriented x 3.  EKG:    ASSESSMENT AND PLAN:

## 2012-05-05 NOTE — Assessment & Plan Note (Signed)
Her most recent CT scan was done in [place]. She's seeing Dr. Erskine Emery in followup. She will continue to follow with cardiothoracic surgeons.

## 2012-05-12 ENCOUNTER — Encounter: Payer: Self-pay | Admitting: *Deleted

## 2012-05-20 ENCOUNTER — Encounter: Payer: Self-pay | Admitting: Internal Medicine

## 2012-05-20 ENCOUNTER — Ambulatory Visit (INDEPENDENT_AMBULATORY_CARE_PROVIDER_SITE_OTHER): Payer: Medicare Other | Admitting: Internal Medicine

## 2012-05-20 VITALS — BP 110/70 | HR 77 | Ht 62.0 in | Wt 155.0 lb

## 2012-05-20 DIAGNOSIS — I2589 Other forms of chronic ischemic heart disease: Secondary | ICD-10-CM

## 2012-05-20 DIAGNOSIS — I255 Ischemic cardiomyopathy: Secondary | ICD-10-CM

## 2012-05-20 LAB — ICD DEVICE OBSERVATION
HV IMPEDENCE: 82 Ohm
RV LEAD AMPLITUDE: 12 mv
RV LEAD IMPEDENCE ICD: 560 Ohm
RV LEAD THRESHOLD: 0.75 V
TZON-0003SLOWVT: 340 ms
TZON-0004SLOWVT: 24
TZON-0005SLOWVT: 6
TZON-0010SLOWVT: 40 ms
VENTRICULAR PACING ICD: 1 pct

## 2012-05-20 NOTE — Patient Instructions (Signed)
Remote monitoring is used to monitor your Pacemaker of ICD from home. This monitoring reduces the number of office visits required to check your device to one time per year. It allows [place] to keep an eye on the functioning of your device to ensure it is working properly. You are scheduled for a device check from home on August 23, 2012. You may send your transmission at any time that day. If you have a wireless device, the transmission will be sent automatically. After your physician reviews your transmission, you will receive a postcard with your next transmission date.  Your physician wants you to follow-up in: 1 year with Dr Rayann Heman.  You will receive a reminder letter in the mail two months in advance. If you don't receive a letter, please call our office to schedule the follow-up appointment.

## 2012-05-20 NOTE — Progress Notes (Signed)
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 female who presents today for routine electrophysiology followup.  Since last being seen in our clinic, the patient reports doing well.   Today, she denies symptoms of palpitations, exertional chest pain, shortness of breath,  lower extremity edema, dizziness, presyncope, syncope, or ICD shocks.  The patient is otherwise without complaint today.   Past Medical History  Diagnosis Date  . CAD (coronary artery disease) 02/2009    S/P anterior MI  . Hyperlipemia   . Benign neoplasm of colon   . Unspecified mastoiditis   . Irritable bowel syndrome   . Hypertension   . Diverticulosis   . Colon polyp   . Ischemic cardiomyopathy     EF 25%  . Chronic systolic dysfunction of left ventricle   . Pulmonary embolism   . Chronic renal insufficiency   . Myocardial infarct    Past Surgical History  Procedure Date  . Total abdominal hysterectomy   . Bilateral salpingoophorectomy   . Inner ear surgery     left x 17  . Cervical spine surgery 08/08  . Lumbar disc surgery 08/09  . Cad( bare metal stent) 8/10  . Angioplasty 07/02/09, 04/01/10  . Cardiac defibrillator placement 08/05/11    Primary prevention SJM ICD implanted,  Analyze ST study patient    Current Outpatient Prescriptions  Medication Sig Dispense Refill  . ADVAIR DISKUS 500-50 MCG/DOSE AEPB Inhale 1 puff into the lungs 2 (two) times daily.       Marland Kitchen albuterol (PROAIR HFA) 108 (90 BASE) MCG/ACT inhaler Inhale 2 puffs into the lungs every 6 (six) hours as needed. For shortness of breath      . amitriptyline (ELAVIL) 50 MG tablet Take 1 tablet (50 mg total) by mouth at bedtime.  30 tablet  3  . aspirin 81 MG tablet Take 81 mg by mouth daily.       . bisoprolol (ZEBETA) 10 MG tablet Take 15 mg by mouth daily. Take one and one-half tablet by mouth daily      . chlorpheniramine-HYDROcodone (TUSSIONEX PENNKINETIC ER) 10-8 MG/5ML LQCR Take 5 mLs by mouth  every 12 (twelve) hours as needed.  60 mL  0  . divalproex (DEPAKOTE) 250 MG DR tablet Take 250 mg by mouth 2 (two) times daily.      Marland Kitchen esomeprazole (NEXIUM) 40 MG capsule Take 40 mg by mouth daily as needed. For acid reflux      . gabapentin (NEURONTIN) 300 MG capsule Take 300 mg by mouth 3 (three) times daily.       Marland Kitchen HYDROcodone-acetaminophen (VICODIN) 5-500 MG per tablet Take 1 tablet by mouth every 6 (six) hours as needed. BACK PAIN      . lisinopril (PRINIVIL,ZESTRIL) 10 MG tablet Take 10 mg by mouth daily.      . montelukast (SINGULAIR) 10 MG tablet Take 10 mg by mouth at bedtime.       . polyethylene glycol powder (GLYCOLAX/MIRALAX) powder Take 17 g by mouth daily as needed. For constipation      . rosuvastatin (CRESTOR) 20 MG tablet Take 20 mg by mouth at bedtime.        Physical Exam: Filed Vitals:   05/20/12 1057  BP: 110/70  Pulse: 77  Height: 5\' 2"  (1.575 m)  Weight: 155 lb (70.308 kg)  SpO2: 97%    GEN- The patient is well appearing, alert and oriented x 3 today.  Head- normocephalic, atraumatic Eyes-  Sclera clear, conjunctiva pink Ears- hearing intact Oropharynx- clear Lungs- Clear to ausculation bilaterally, normal work of breathing Chest- ICD pocket is well healed Heart- Regular rate and rhythm, no murmurs, rubs or gallops, PMI not laterally displaced GI- soft, NT, ND, + BS Extremities- no clubbing, cyanosis, or edema  ICD interrogation- reviewed in detail today,  See PACEART report  Assessment and Plan:  Chronic systolic heart failure  Normal ICD function  See Pace Art report  No changes today   Cardiomyopathy, ischemic  No ischemic symptoms  No chf on exam   Follow-up with Dr [name] Return to the device clinic in 6 months I will see again in 1 year

## 2012-07-14 ENCOUNTER — Ambulatory Visit (INDEPENDENT_AMBULATORY_CARE_PROVIDER_SITE_OTHER): Payer: Medicare Other | Admitting: Cardiology

## 2012-07-14 ENCOUNTER — Encounter: Payer: Self-pay | Admitting: Cardiology

## 2012-07-14 VITALS — BP 122/80 | HR 72 | Ht 62.0 in | Wt 158.8 lb

## 2012-07-14 DIAGNOSIS — I10 Essential (primary) hypertension: Secondary | ICD-10-CM

## 2012-07-14 DIAGNOSIS — I2589 Other forms of chronic ischemic heart disease: Secondary | ICD-10-CM

## 2012-07-14 DIAGNOSIS — I7122 Aneurysm of the aortic arch, without rupture: Secondary | ICD-10-CM

## 2012-07-14 DIAGNOSIS — I255 Ischemic cardiomyopathy: Secondary | ICD-10-CM

## 2012-07-14 DIAGNOSIS — IMO0001 Reserved for inherently not codable concepts without codable children: Secondary | ICD-10-CM

## 2012-07-14 DIAGNOSIS — I719 Aortic aneurysm of unspecified site, without rupture: Secondary | ICD-10-CM

## 2012-07-14 DIAGNOSIS — R61 Generalized hyperhidrosis: Secondary | ICD-10-CM

## 2012-07-14 DIAGNOSIS — I712 Thoracic aortic aneurysm, without rupture: Secondary | ICD-10-CM

## 2012-07-14 DIAGNOSIS — I2510 Atherosclerotic heart disease of native coronary artery without angina pectoris: Secondary | ICD-10-CM

## 2012-07-14 DIAGNOSIS — F17200 Nicotine dependence, unspecified, uncomplicated: Secondary | ICD-10-CM

## 2012-07-14 NOTE — Progress Notes (Signed)
HPI:  The patient returns in a followup visit. From a cardiac standpoint she has been perfectly stable. She's not had any chest pain. She denies shortness of breath she does break out in a sweat when she is active. This is really been going on for quite some time. She however says that she experiences absolutely no chest discomfort with this at all. She denies any fevers, rigars, or shaking chills.  Current Outpatient Prescriptions  Medication Sig Dispense Refill  . ADVAIR DISKUS 500-50 MCG/DOSE AEPB Inhale 1 puff into the lungs 2 (two) times daily.       Marland Kitchen albuterol (PROAIR HFA) 108 (90 BASE) MCG/ACT inhaler Inhale 2 puffs into the lungs every 6 (six) hours as needed. For shortness of breath      . amitriptyline (ELAVIL) 50 MG tablet Take 1 tablet (50 mg total) by mouth at bedtime.  30 tablet  3  . aspirin 81 MG tablet Take 81 mg by mouth daily.       . bisoprolol (ZEBETA) 10 MG tablet Take 15 mg by mouth daily. Take one and one-half tablet by mouth daily      . divalproex (DEPAKOTE) 250 MG DR tablet Take 250 mg by mouth 2 (two) times daily.      Marland Kitchen esomeprazole (NEXIUM) 40 MG capsule Take 40 mg by mouth daily as needed. For acid reflux      . gabapentin (NEURONTIN) 300 MG capsule Take 300 mg by mouth 3 (three) times daily.       Marland Kitchen HYDROcodone-acetaminophen (VICODIN) 5-500 MG per tablet Take 1 tablet by mouth every 6 (six) hours as needed. BACK PAIN      . lisinopril (PRINIVIL,ZESTRIL) 10 MG tablet Take 10 mg by mouth daily.      . montelukast (SINGULAIR) 10 MG tablet Take 10 mg by mouth at bedtime.       . polyethylene glycol powder (GLYCOLAX/MIRALAX) powder Take 17 g by mouth daily as needed. For constipation      . rosuvastatin (CRESTOR) 20 MG tablet Take 20 mg by mouth at bedtime.        Allergies  Allergen Reactions  . Sulfonamide Derivatives Other (See Comments)    UNSURE    Past Medical History  Diagnosis Date  . CAD (coronary artery disease) 02/2009    S/P anterior MI  .  Hyperlipemia   . Benign neoplasm of colon   . Unspecified mastoiditis   . Irritable bowel syndrome   . Hypertension   . Diverticulosis   . Colon polyp   . Ischemic cardiomyopathy     EF 25%  . Chronic systolic dysfunction of left ventricle   . Pulmonary embolism   . Chronic renal insufficiency   . Myocardial infarct     Past Surgical History  Procedure Date  . Total abdominal hysterectomy   . Bilateral salpingoophorectomy   . Inner ear surgery     left x 17  . Cervical spine surgery 08/08  . Lumbar disc surgery 08/09  . Cad( bare metal stent) 8/10  . Angioplasty 07/02/09, 04/01/10  . Cardiac defibrillator placement 08/05/11    Primary prevention SJM ICD implanted,  Analyze ST study patient    Family History  Problem Relation Age of Onset  . Colon cancer Mother   . Colon cancer Brother   . Cancer Brother     Bladder  . Breast cancer Cousin     History   Social History  . Marital Status: Divorced  Spouse Name: N/A    Number of Children: N/A  . Years of Education: N/A   Occupational History  . Unemployed    Social History Main Topics  . Smoking status: Former Smoker    Quit date: 04/27/2009  . Smokeless tobacco: Not on file  . Alcohol Use: No  . Drug Use: No  . Sexually Active: Not on file   Other Topics Concern  . Not on file   Social History Narrative   Pt lives in [place] alone.  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  All other systems reviewed and negative.  PHYSICAL EXAM:  BP 122/80  Pulse 72  Ht 5\' 2"  (1.575 m)  Wt 158 lb 12.8 oz (72.031 kg)  BMI 29.04 kg/m2  SpO2 99%  General: Well developed, well nourished, in no acute distress. Head:  Normocephalic and atraumatic. Neck: no JVD Lungs: Clear to auscultation and percussion. Heart: Normal S1 and S2.  No murmur, rubs or gallops.  Extremities: No clubbing or cyanosis. No edema Neurologic: Alert and oriented x 3.  EKG:  NSR.  LAE.  IRBBB.  Septal  MI, age indeterminate.    ASSESSMENT AND PLAN:

## 2012-07-14 NOTE — Patient Instructions (Addendum)
Your physician recommends that you continue on your current medications as directed. Please refer to the Current Medication list given to you today.  Your physician recommends that you schedule a follow-up appointment in: March with Dr. [name]

## 2012-07-26 NOTE — Assessment & Plan Note (Signed)
She notes this as a prominent symptom.  She denies any chest pain, or symptoms similar to what she had before.  She simply says she sweats when she does things.

## 2012-07-26 NOTE — Assessment & Plan Note (Signed)
Denies use at present.

## 2012-07-26 NOTE — Assessment & Plan Note (Signed)
Has seen [facility] regarding this.  Being followed.

## 2012-07-26 NOTE — Assessment & Plan Note (Signed)
Controlled at present.  

## 2012-07-26 NOTE — Assessment & Plan Note (Addendum)
Functional status remains excellent.  She is on medical therapy and has an ICD.  Not on aldosterone inhibitor, but I would be a bit concerned with this.  Interestingly, she developed acidosis associated with Topomax--?RTA.

## 2012-08-09 ENCOUNTER — Other Ambulatory Visit: Payer: Self-pay | Admitting: Gastroenterology

## 2012-08-09 MED ORDER — AMITRIPTYLINE HCL 50 MG PO TABS: 50.0000 mg | ORAL_TABLET | Freq: Every day | ORAL | Status: DC

## 2012-08-09 NOTE — Telephone Encounter (Signed)
yes

## 2012-08-09 NOTE — Telephone Encounter (Signed)
This patient needs a script for Amitriptyline  She has not been seen in over a year. Do you want to refill??

## 2012-08-09 NOTE — Telephone Encounter (Signed)
Sent in med to pts pharmacy. Pt aware

## 2012-08-13 DIAGNOSIS — R251 Tremor, unspecified: Secondary | ICD-10-CM | POA: Insufficient documentation

## 2012-08-23 ENCOUNTER — Ambulatory Visit (INDEPENDENT_AMBULATORY_CARE_PROVIDER_SITE_OTHER): Payer: Medicaid Other | Admitting: *Deleted

## 2012-08-23 ENCOUNTER — Encounter: Payer: Self-pay | Admitting: Internal Medicine

## 2012-08-23 DIAGNOSIS — I255 Ischemic cardiomyopathy: Secondary | ICD-10-CM

## 2012-08-23 DIAGNOSIS — I2589 Other forms of chronic ischemic heart disease: Secondary | ICD-10-CM

## 2012-08-23 DIAGNOSIS — Z95810 Presence of automatic (implantable) cardiac defibrillator: Secondary | ICD-10-CM

## 2012-08-24 LAB — REMOTE ICD DEVICE
DEV-0020ICD: NEGATIVE
HV IMPEDENCE: 68 Ohm
RV LEAD AMPLITUDE: 12 mv
RV LEAD IMPEDENCE ICD: 480 Ohm
TZON-0005SLOWVT: 6
TZON-0010SLOWVT: 40 ms
VENTRICULAR PACING ICD: 1 pct

## 2012-09-06 ENCOUNTER — Other Ambulatory Visit: Payer: Self-pay | Admitting: *Deleted

## 2012-09-06 DIAGNOSIS — I712 Thoracic aortic aneurysm, without rupture: Secondary | ICD-10-CM

## 2012-09-13 ENCOUNTER — Other Ambulatory Visit: Payer: Self-pay | Admitting: *Deleted

## 2012-09-14 ENCOUNTER — Encounter: Payer: Self-pay | Admitting: Internal Medicine

## 2012-09-14 ENCOUNTER — Encounter (INDEPENDENT_AMBULATORY_CARE_PROVIDER_SITE_OTHER): Payer: Medicare Other

## 2012-09-14 ENCOUNTER — Ambulatory Visit (INDEPENDENT_AMBULATORY_CARE_PROVIDER_SITE_OTHER): Payer: Medicare Other | Admitting: *Deleted

## 2012-09-14 ENCOUNTER — Encounter: Payer: Self-pay | Admitting: *Deleted

## 2012-09-14 ENCOUNTER — Other Ambulatory Visit: Payer: Self-pay | Admitting: *Deleted

## 2012-09-14 ENCOUNTER — Other Ambulatory Visit: Payer: Self-pay | Admitting: Internal Medicine

## 2012-09-14 ENCOUNTER — Other Ambulatory Visit: Payer: Self-pay | Admitting: Cardiology

## 2012-09-14 DIAGNOSIS — R0989 Other specified symptoms and signs involving the circulatory and respiratory systems: Secondary | ICD-10-CM

## 2012-09-14 DIAGNOSIS — I255 Ischemic cardiomyopathy: Secondary | ICD-10-CM

## 2012-09-14 DIAGNOSIS — I2589 Other forms of chronic ischemic heart disease: Secondary | ICD-10-CM

## 2012-09-14 MED ORDER — BISOPROLOL FUMARATE 5 MG PO TABS: ORAL_TABLET | ORAL | Status: DC

## 2012-09-14 NOTE — Telephone Encounter (Signed)
This encounter was created in error - please disregard.

## 2012-09-14 NOTE — Telephone Encounter (Signed)
I called patient to verify about the strength of Bisoprolol she takes since we had on file to be 10 mg and she take one and one- half daily but a refill request was sent in from [facility] for Bisoprolol 5mg  half tablet daily. Patient confirmed with me by checking her med. Bottle and said its 5 mg one and one-half a day. I sent refill request for 5 mg one and one-half a day just as patient takes.

## 2012-10-06 ENCOUNTER — Ambulatory Visit: Payer: Medicare Other | Admitting: Cardiothoracic Surgery

## 2012-10-06 ENCOUNTER — Ambulatory Visit
Admission: RE | Admit: 2012-10-06 | Discharge: 2012-10-06 | Disposition: A | Discharge status: Home / Self Care | Payer: Medicare Other | Source: Ambulatory Visit | Attending: Cardiothoracic Surgery | Admitting: Cardiothoracic Surgery

## 2012-10-06 DIAGNOSIS — I712 Thoracic aortic aneurysm, without rupture: Secondary | ICD-10-CM

## 2012-10-06 LAB — CREATININE, SERUM: Creat: 1.16 mg/dL — ABNORMAL HIGH (ref 0.50–1.10)

## 2012-10-06 LAB — BUN: BUN: 22 mg/dL (ref 6–23)

## 2012-10-06 MED ORDER — IOHEXOL 350 MG/ML SOLN
80.0000 mL | Freq: Once | INTRAVENOUS | Status: AC | PRN
  Administered 2012-10-06: 80 mL via INTRAVENOUS

## 2012-10-09 ENCOUNTER — Emergency Department (HOSPITAL_COMMUNITY): Payer: Medicare Other

## 2012-10-09 ENCOUNTER — Encounter (HOSPITAL_COMMUNITY): Payer: Self-pay | Admitting: *Deleted

## 2012-10-09 ENCOUNTER — Emergency Department (HOSPITAL_COMMUNITY)
Admission: EM | Admit: 2012-10-09 | Discharge: 2012-10-09 | Disposition: A | Discharge status: Home / Self Care | Payer: Medicare Other | Attending: Emergency Medicine | Admitting: Emergency Medicine

## 2012-10-09 DIAGNOSIS — Z860100 Personal history of colon polyps, unspecified: Secondary | ICD-10-CM | POA: Insufficient documentation

## 2012-10-09 DIAGNOSIS — Z87891 Personal history of nicotine dependence: Secondary | ICD-10-CM | POA: Insufficient documentation

## 2012-10-09 DIAGNOSIS — R112 Nausea with vomiting, unspecified: Secondary | ICD-10-CM | POA: Insufficient documentation

## 2012-10-09 DIAGNOSIS — I2510 Atherosclerotic heart disease of native coronary artery without angina pectoris: Secondary | ICD-10-CM | POA: Insufficient documentation

## 2012-10-09 DIAGNOSIS — I10 Essential (primary) hypertension: Secondary | ICD-10-CM | POA: Insufficient documentation

## 2012-10-09 DIAGNOSIS — Z86711 Personal history of pulmonary embolism: Secondary | ICD-10-CM | POA: Insufficient documentation

## 2012-10-09 DIAGNOSIS — J439 Emphysema, unspecified: Secondary | ICD-10-CM

## 2012-10-09 DIAGNOSIS — Z87448 Personal history of other diseases of urinary system: Secondary | ICD-10-CM | POA: Insufficient documentation

## 2012-10-09 DIAGNOSIS — Z8679 Personal history of other diseases of the circulatory system: Secondary | ICD-10-CM | POA: Insufficient documentation

## 2012-10-09 DIAGNOSIS — E785 Hyperlipidemia, unspecified: Secondary | ICD-10-CM | POA: Insufficient documentation

## 2012-10-09 DIAGNOSIS — J441 Chronic obstructive pulmonary disease with (acute) exacerbation: Secondary | ICD-10-CM | POA: Insufficient documentation

## 2012-10-09 DIAGNOSIS — Z8719 Personal history of other diseases of the digestive system: Secondary | ICD-10-CM | POA: Insufficient documentation

## 2012-10-09 DIAGNOSIS — I252 Old myocardial infarction: Secondary | ICD-10-CM | POA: Insufficient documentation

## 2012-10-09 DIAGNOSIS — Z95810 Presence of automatic (implantable) cardiac defibrillator: Secondary | ICD-10-CM | POA: Insufficient documentation

## 2012-10-09 DIAGNOSIS — Z8669 Personal history of other diseases of the nervous system and sense organs: Secondary | ICD-10-CM | POA: Insufficient documentation

## 2012-10-09 DIAGNOSIS — R109 Unspecified abdominal pain: Secondary | ICD-10-CM | POA: Insufficient documentation

## 2012-10-09 DIAGNOSIS — Z90710 Acquired absence of both cervix and uterus: Secondary | ICD-10-CM | POA: Insufficient documentation

## 2012-10-09 DIAGNOSIS — Z79899 Other long term (current) drug therapy: Secondary | ICD-10-CM | POA: Insufficient documentation

## 2012-10-09 DIAGNOSIS — Z9079 Acquired absence of other genital organ(s): Secondary | ICD-10-CM | POA: Insufficient documentation

## 2012-10-09 DIAGNOSIS — Z7982 Long term (current) use of aspirin: Secondary | ICD-10-CM | POA: Insufficient documentation

## 2012-10-09 LAB — BASIC METABOLIC PANEL
BUN: 23 mg/dL (ref 6–23)
CO2: 22 mEq/L (ref 19–32)
Calcium: 9.4 mg/dL (ref 8.4–10.5)
Chloride: 103 mEq/L (ref 96–112)
Creatinine, Ser: 1.16 mg/dL — ABNORMAL HIGH (ref 0.50–1.10)
GFR calc Af Amer: 56 mL/min — ABNORMAL LOW (ref >90)
GFR calc non Af Amer: 48 mL/min — ABNORMAL LOW (ref >90)
Glucose, Bld: 151 mg/dL — ABNORMAL HIGH (ref 70–99)
Potassium: 4.4 mEq/L (ref 3.5–5.1)
Sodium: 137 mEq/L (ref 135–145)

## 2012-10-09 LAB — CBC WITH DIFFERENTIAL/PLATELET
Basophils Absolute: 0 10*3/uL (ref 0.0–0.1)
Basophils Relative: 0 % (ref 0–1)
Eosinophils Relative: 2 % (ref 0–5)
HCT: 42.2 % (ref 36.0–46.0)
MCHC: 33.2 g/dL (ref 30.0–36.0)
MCV: 99.5 fL (ref 78.0–100.0)
Monocytes Absolute: 0.3 10*3/uL (ref 0.1–1.0)
Neutro Abs: 4.3 10*3/uL (ref 1.7–7.7)
Platelets: 207 10*3/uL (ref 150–400)
RDW: 13.5 % (ref 11.5–15.5)

## 2012-10-09 LAB — HEPATIC FUNCTION PANEL
ALT: 17 U/L (ref 0–35)
AST: 20 U/L (ref 0–37)
Albumin: 3.9 g/dL (ref 3.5–5.2)
Alkaline Phosphatase: 96 U/L (ref 39–117)
Total Protein: 7.1 g/dL (ref 6.0–8.3)

## 2012-10-09 MED ORDER — MORPHINE SULFATE 4 MG/ML IJ SOLN
6.0000 mg | Freq: Once | INTRAMUSCULAR | Status: AC
  Administered 2012-10-09: 6 mg via INTRAVENOUS
  Filled 2012-10-09: qty 2

## 2012-10-09 MED ORDER — SODIUM CHLORIDE 0.9 % IV SOLN
Freq: Once | INTRAVENOUS | Status: AC
  Administered 2012-10-09: 06:00:00 via INTRAVENOUS

## 2012-10-09 MED ORDER — ONDANSETRON HCL 4 MG PO TABS: 4.0000 mg | ORAL_TABLET | Freq: Four times a day (QID) | ORAL | Status: DC

## 2012-10-09 MED ORDER — ONDANSETRON HCL 4 MG/2ML IJ SOLN
4.0000 mg | Freq: Once | INTRAMUSCULAR | Status: AC
  Administered 2012-10-09: 4 mg via INTRAVENOUS
  Filled 2012-10-09: qty 2

## 2012-10-09 NOTE — ED Provider Notes (Signed)
History     CSN: [id number]  Arrival date & time 10/09/12  0258   First MD Initiated Contact with Patient 10/09/12 0303      Chief Complaint  Patient presents with  . Shortness of Breath  . Emesis    (Consider location/radiation/quality/duration/timing/severity/associated sxs/prior treatment) HPI Comments: Patient with known COPD states she's been using her inhaler on a regular basis, called EMS after 1 inhaler, use for increased shortness of breath.  She also, reports she's had intermittent, bilateral upper abdominal pain for the past, week, with nausea, and unsure, if she's had diarrhea, not as she keeps changing her history.  She does report that her appetite has been decreased for the past, week.  She did see her primary care physician earlier this week and had a CT angiography for chest to evaluate her stable thoracic aneurysm  Patient is a 67 y.o. female presenting with shortness of breath and vomiting. The history is provided by the patient.  Shortness of Breath Severity:  Moderate Onset quality:  Sudden Timing:  Constant Progression:  Worsening Chronicity:  Recurrent Associated symptoms: abdominal pain, vomiting and wheezing   Associated symptoms: no fever and no headaches   Emesis Associated symptoms: abdominal pain   Associated symptoms: no chills, no diarrhea and no headaches     Past Medical History  Diagnosis Date  . CAD (coronary artery disease) 02/2009    S/P anterior MI  . Hyperlipemia   . Benign neoplasm of colon   . Unspecified mastoiditis   . Irritable bowel syndrome   . Hypertension   . Diverticulosis   . Colon polyp   . Ischemic cardiomyopathy     EF 25%  . Chronic systolic dysfunction of left ventricle   . Pulmonary embolism   . Chronic renal insufficiency   . Myocardial infarct     Past Surgical History  Procedure Laterality Date  . Total abdominal hysterectomy    . Bilateral salpingoophorectomy    . Inner ear surgery      left x 17  .  Cervical spine surgery  08/08  . Lumbar disc surgery  08/09  . Cad( bare metal stent)  8/10  . Angioplasty  07/02/09, 04/01/10  . Cardiac defibrillator placement  08/05/11    Primary prevention SJM ICD implanted,  Analyze ST study patient    Family History  Problem Relation Age of Onset  . Colon cancer Mother   . Colon cancer Brother   . Cancer Brother     Bladder  . Breast cancer Cousin     History  Substance Use Topics  . Smoking status: Former Smoker    Quit date: 04/27/2009  . Smokeless tobacco: Not on file  . Alcohol Use: No    OB History   Grav Para Term Preterm Abortions TAB SAB Ect Mult Living                  Review of Systems  Constitutional: Negative for fever and chills.  Respiratory: Positive for shortness of breath and wheezing.   Gastrointestinal: Positive for nausea, vomiting and abdominal pain. Negative for diarrhea.  Neurological: Negative for dizziness, weakness and headaches.  All other systems reviewed and are negative.    Allergies  Sulfonamide derivatives  Home Medications   Current Outpatient Rx  Name  Route  Sig  Dispense  Refill  . albuterol (PROAIR HFA) 108 (90 BASE) MCG/ACT inhaler   Inhalation   Inhale 2 puffs into the lungs every 6 (six)  hours as needed. For shortness of breath         . aspirin 81 MG tablet   Oral   Take 81 mg by mouth daily.          . bisoprolol (ZEBETA) 5 MG tablet   Oral   Take 5 mg by mouth daily.         Marland Kitchen lisinopril (PRINIVIL,ZESTRIL) 10 MG tablet   Oral   Take 10 mg by mouth daily.         . montelukast (SINGULAIR) 10 MG tablet   Oral   Take 10 mg by mouth at bedtime.          . rosuvastatin (CRESTOR) 20 MG tablet   Oral   Take 20 mg by mouth at bedtime.         . ondansetron (ZOFRAN) 4 MG tablet   Oral   Take 1 tablet (4 mg total) by mouth every 6 (six) hours.   12 tablet   0     BP 143/96  Pulse 90  Temp(Src) 98.8 F (37.1 C) (Oral)  Resp 26  SpO2 99%  Physical Exam   Constitutional: She is oriented to person, place, and time. She appears well-developed and well-nourished. No distress.  HENT:  Head: Atraumatic.  Eyes: Pupils are equal, round, and reactive to light.  Neck: Normal range of motion.  Cardiovascular: Normal rate and regular rhythm.   Pulmonary/Chest: Effort normal. She has wheezes. She exhibits no tenderness.  Abdominal: Soft. Bowel sounds are normal. There is tenderness in the right upper quadrant, epigastric area and left upper quadrant. There is no rebound and no guarding.  Musculoskeletal: Normal range of motion.  Neurological: She is alert and oriented to person, place, and time.  Skin: Skin is warm and dry. There is pallor.    ED Course  Procedures (including critical care time)  Labs Reviewed  BASIC METABOLIC PANEL - Abnormal; Notable for the following:    Glucose, Bld 151 (*)    Creatinine, Ser 1.16 (*)    GFR calc non Af Amer 48 (*)    GFR calc Af Amer 56 (*)    All other components within normal limits  CBC WITH DIFFERENTIAL  CBC WITH DIFFERENTIAL  BASIC METABOLIC PANEL  HEPATIC FUNCTION PANEL  LIPASE, BLOOD   Dg Chest 2 View  10/09/2012  *RADIOLOGY REPORT*  Clinical Data: Shortness of breath.  Nausea.  Abdominal pain.  CHEST - 2 VIEW  Comparison: 04/05/2012  Findings: Stable appearance of cardiac pacemaker.  Shallow inspiration.  Borderline heart size with normal pulmonary vascularity.  Interval development of interstitial changes in the lungs suggesting interstitial infiltration or edema.  No blunting of costophrenic angles.  No pneumothorax.  Degenerative changes in the thoracic spine.  Postoperative changes in the cervical spine.  IMPRESSION: Interval development of interstitial changes in the lungs suggesting interstitial infiltration or edema.   Original Report Authenticated By: Lucienne Capers, M.D.    US Abdomen Complete  10/09/2012  *RADIOLOGY REPORT*  Clinical Data:  Abdominal pain and vomiting.  COMPLETE  ABDOMINAL ULTRASOUND  Comparison:  CT abdomen and pelvis 01/08/2012  Findings:  Gallbladder:  No gallstones, gallbladder wall thickening, or pericholecystic fluid.  Common bile duct:  Normal caliber.  Diameter measures 4.8 mm.  Liver:  No focal lesion identified.  Within normal limits in parenchymal echogenicity.  IVC:  Appears normal.  Pancreas:  No focal abnormality seen.  Spleen:  Spleen length measures 6.9 cm.  Normal parenchymal  echotexture.  Right Kidney:  Right kidney measures 9.3 cm length.  No hydronephrosis.  Left Kidney:  Left kidney measures 10.2 cm length.  No hydronephrosis.  Abdominal aorta:  No aneurysm identified.  IMPRESSION: Negative abdominal ultrasound.   Original Report Authenticated By: Lucienne Capers, M.D.      1. COPD (chronic obstructive pulmonary disease) with emphysema   2. Abdominal pain       MDM   Labs, x-ray, ultrasound, all reviewed.  Patient is breathing much better.  I will discharge her home with instructions to use her inhaler on a regular basis.  I will give her a prescription for Zofran 4 nausea control.  Encouraged late eating.  She can return if she develops any new or worsening symptoms.  At this time.  Discussed with Dr. Venora Maples.  We do not believe that there is a pneumonia or heart failure presents at this time        Garald Balding, NP 10/09/12 [phone]

## 2012-10-09 NOTE — ED Notes (Signed)
EMS called to home.  Found patient sitting in chair. Patient complaining of nausea, vomiting and shortness of breath. She also is complaining of pain 6 of 10 down the center of her abdomen.  BP elevated on scene and has decreased upon arrival to the ED

## 2012-10-09 NOTE — ED Provider Notes (Signed)
Medical screening examination/treatment/procedure(s) were conducted as a shared visit with non-physician practitioner(s) and myself.  I personally evaluated the patient during the encounter  Patient mild right upper quadrant tenderness.  Ultrasound without etiology found.  Nausea vomiting with some diarrhea.  Home and nausea medicine.  Patient feels like her breathing is improved  Hoy Morn, MD 10/09/12 [phone]

## 2012-10-09 NOTE — ED Notes (Signed)
Patient is alert and oriented x3.  She was given DC instructions and follow up visit instructions.  Patient gave verbal understanding. She was DC ambulatory under her own power to home.  V/S stable.  He was not showing any signs of distress on DC 

## 2012-10-12 ENCOUNTER — Encounter: Payer: Medicare Other | Admitting: Thoracic Surgery (Cardiothoracic Vascular Surgery)

## 2012-10-13 ENCOUNTER — Encounter: Payer: Self-pay | Admitting: Cardiology

## 2012-10-13 ENCOUNTER — Ambulatory Visit (INDEPENDENT_AMBULATORY_CARE_PROVIDER_SITE_OTHER): Payer: Medicare Other | Admitting: Cardiology

## 2012-10-13 VITALS — BP 126/88 | HR 88 | Ht 63.0 in | Wt 160.0 lb

## 2012-10-13 DIAGNOSIS — I10 Essential (primary) hypertension: Secondary | ICD-10-CM

## 2012-10-13 DIAGNOSIS — I2510 Atherosclerotic heart disease of native coronary artery without angina pectoris: Secondary | ICD-10-CM

## 2012-10-13 DIAGNOSIS — Z86711 Personal history of pulmonary embolism: Secondary | ICD-10-CM

## 2012-10-13 DIAGNOSIS — R079 Chest pain, unspecified: Secondary | ICD-10-CM

## 2012-10-13 DIAGNOSIS — I5022 Chronic systolic (congestive) heart failure: Secondary | ICD-10-CM

## 2012-10-13 NOTE — Assessment & Plan Note (Signed)
BP is perhaps slightly elevated.  So is HR.  May consider increasing bisoprolol but watch for now.

## 2012-10-13 NOTE — Assessment & Plan Note (Signed)
Need to keep this in mind---did have recent CT but not PE protocol.

## 2012-10-13 NOTE — Assessment & Plan Note (Signed)
Etiology of this was unclear. She presented with shortness of breath improved with simple measures. Ideally about potential for pulmonary emboli, although she's not had a clinical recurrence. She's not having any significant shortness of breath at the present time, and her lung fields are clear. She has no evidence of peripheral DVT that I can tell. Her current Wells score for PE, level II, is 1.5.  We will see her back in follow up in three weeks, and if she has recurrent issues she is to contact us promptly.

## 2012-10-13 NOTE — Assessment & Plan Note (Signed)
No classic angina at present.

## 2012-10-13 NOTE — Progress Notes (Signed)
HPI:  This patient comes in today for followup visit. She was in the emergency room a few days ago when she woke up with shortness of breath. She also had diarrhea and abdominal pain. She was seen in the emergency room and thought to have findings of COPD. She was given an inhaler and improved. He did not do any other studies, although the chest x-ray suggested some interstitial edema. Evaluation did not suggest heart failure, and she was dismissed without problems. She's had no recurrent problems that were similar, but did have a little bit of chest tightness on Monday but she thinks this may be related to some gastroesophageal reflux. She's not having any symptoms today whatsoever.  Current Outpatient Prescriptions  Medication Sig Dispense Refill  . albuterol (PROAIR HFA) 108 (90 BASE) MCG/ACT inhaler Inhale 2 puffs into the lungs every 6 (six) hours as needed. For shortness of breath      . amitriptyline (ELAVIL) 50 MG tablet Take one every night      . aspirin 81 MG tablet Take 81 mg by mouth daily.       . bisoprolol (ZEBETA) 5 MG tablet Take 5 mg by mouth daily.      Marland Kitchen lisinopril (PRINIVIL,ZESTRIL) 10 MG tablet Take 10 mg by mouth daily.      . montelukast (SINGULAIR) 10 MG tablet Take 10 mg by mouth at bedtime.       Marland Kitchen NEXIUM 40 MG capsule Take as needed      . ondansetron (ZOFRAN) 4 MG tablet Take 1 tablet (4 mg total) by mouth every 6 (six) hours.  12 tablet  0  . polyethylene glycol powder (GLYCOLAX/MIRALAX) powder Take as needed      . rosuvastatin (CRESTOR) 20 MG tablet Take 20 mg by mouth at bedtime.       No current facility-administered medications for this visit.    Allergies  Allergen Reactions  . Sulfonamide Derivatives Other (See Comments)    UNSURE    Past Medical History  Diagnosis Date  . CAD (coronary artery disease) 02/2009    S/P anterior MI  . Hyperlipemia   . Benign neoplasm of colon   . Unspecified mastoiditis   . Irritable bowel syndrome   .  Hypertension   . Diverticulosis   . Colon polyp   . Ischemic cardiomyopathy     EF 25%  . Chronic systolic dysfunction of left ventricle   . Pulmonary embolism   . Chronic renal insufficiency   . Myocardial infarct     Past Surgical History  Procedure Laterality Date  . Total abdominal hysterectomy    . Bilateral salpingoophorectomy    . Inner ear surgery      left x 17  . Cervical spine surgery  08/08  . Lumbar disc surgery  08/09  . Cad( bare metal stent)  8/10  . Angioplasty  07/02/09, 04/01/10  . Cardiac defibrillator placement  08/05/11    Primary prevention SJM ICD implanted,  Analyze ST study patient    Family History  Problem Relation Age of Onset  . Colon cancer Mother   . Colon cancer Brother   . Cancer Brother     Bladder  . Breast cancer Cousin     History   Social History  . Marital Status: Divorced    Spouse Name: N/A    Number of Children: N/A  . Years of Education: N/A   Occupational History  . Unemployed    Social History  Main Topics  . Smoking status: Former Smoker    Quit date: 04/27/2009  . Smokeless tobacco: Not on file  . Alcohol Use: No  . Drug Use: No  . Sexually Active: Not on file   Other Topics Concern  . Not on file   Social History Narrative   Pt lives in [place] alone.  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  All other systems reviewed and negative.  PHYSICAL EXAM:  BP 126/88  Pulse 88  Ht 5\' 3"  (1.6 m)  Wt 160 lb (72.576 kg)  BMI 28.35 kg/m2  SpO2 95%  General: Well developed, well nourished, in no acute distress. Head:  Normocephalic and atraumatic. Neck: no JVD Lungs: Clear to auscultation and percussion. Heart: Normal S1 and S2.  No murmur, rubs or gallops.  Abdomen:  Normal bowel sounds; soft; non tender; no organomegaly Pulses: Pulses normal in all 4 extremities. Extremities: No clubbing or cyanosis. No edema.  No evidence of DVT.   Neurologic: Alert and oriented x  3.  EKG:  Not done today  CT ANGIO:  IMPRESSION:  Stable size of the focal dilatation at the aortic arch. Findings may represent a ductus diverticulum or small aneurysm. There is some irregularity within the mural thrombus as described.  Old apical infarct in the left ventricle.  Coronary artery calcifications.   Original Report Authenticated By: Markus Daft, M.D.    ASSESSMENT AND PLAN:

## 2012-10-13 NOTE — Assessment & Plan Note (Signed)
She appears euvolemic at present.

## 2012-10-13 NOTE — Patient Instructions (Signed)
Your physician recommends that you schedule a follow-up appointment in: November 10, 2012

## 2012-10-15 ENCOUNTER — Ambulatory Visit: Payer: Medicare Other | Admitting: Cardiothoracic Surgery

## 2012-10-16 MED ORDER — ROSUVASTATIN CALCIUM 20 MG PO TABS: 20.0000 mg | ORAL_TABLET | Freq: Every day | ORAL | Status: DC

## 2012-11-05 NOTE — Progress Notes (Signed)
defib check in clinic for research.

## 2012-11-10 ENCOUNTER — Encounter: Payer: Self-pay | Admitting: Cardiology

## 2012-11-10 ENCOUNTER — Ambulatory Visit (INDEPENDENT_AMBULATORY_CARE_PROVIDER_SITE_OTHER): Payer: Medicare Other | Admitting: Cardiology

## 2012-11-10 VITALS — BP 100/76 | HR 82 | Ht 62.0 in | Wt 159.0 lb

## 2012-11-10 DIAGNOSIS — E785 Hyperlipidemia, unspecified: Secondary | ICD-10-CM

## 2012-11-10 DIAGNOSIS — F17200 Nicotine dependence, unspecified, uncomplicated: Secondary | ICD-10-CM

## 2012-11-10 DIAGNOSIS — I712 Thoracic aortic aneurysm, without rupture: Secondary | ICD-10-CM

## 2012-11-10 DIAGNOSIS — I2510 Atherosclerotic heart disease of native coronary artery without angina pectoris: Secondary | ICD-10-CM

## 2012-11-10 DIAGNOSIS — I2589 Other forms of chronic ischemic heart disease: Secondary | ICD-10-CM

## 2012-11-10 DIAGNOSIS — I255 Ischemic cardiomyopathy: Secondary | ICD-10-CM

## 2012-11-10 DIAGNOSIS — I719 Aortic aneurysm of unspecified site, without rupture: Secondary | ICD-10-CM

## 2012-11-10 LAB — BASIC METABOLIC PANEL WITH GFR
BUN: 20 mg/dL (ref 6–23)
CO2: 25 meq/L (ref 19–32)
Calcium: 9.4 mg/dL (ref 8.4–10.5)
Chloride: 104 meq/L (ref 96–112)
Creatinine, Ser: 1.3 mg/dL — ABNORMAL HIGH (ref 0.4–1.2)
GFR: 44.85 mL/min — ABNORMAL LOW
Glucose, Bld: 98 mg/dL (ref 70–99)
Potassium: 4.2 meq/L (ref 3.5–5.1)
Sodium: 139 meq/L (ref 135–145)

## 2012-11-10 NOTE — Progress Notes (Signed)
HPI:  This very nice patient is in today for follow up.   Overall, she think's she is doing extremely well. Importantly she is not taking any other medications for migraine headaches that she took in the past. She's not having any of the symptoms that she had prior to her last visit.  Current Outpatient Prescriptions  Medication Sig Dispense Refill  . albuterol (PROAIR HFA) 108 (90 BASE) MCG/ACT inhaler Inhale 2 puffs into the lungs every 6 (six) hours as needed. For shortness of breath      . amitriptyline (ELAVIL) 50 MG tablet Take one every night      . aspirin 81 MG tablet Take 81 mg by mouth daily.       . bisoprolol (ZEBETA) 5 MG tablet Take 5 mg by mouth daily.      Marland Kitchen lisinopril (PRINIVIL,ZESTRIL) 10 MG tablet Take 10 mg by mouth daily.      . montelukast (SINGULAIR) 10 MG tablet Take 10 mg by mouth at bedtime.       Marland Kitchen NEXIUM 40 MG capsule Take as needed      . ondansetron (ZOFRAN) 4 MG tablet Take 1 tablet (4 mg total) by mouth every 6 (six) hours.  12 tablet  0  . polyethylene glycol powder (GLYCOLAX/MIRALAX) powder Take as needed      . rosuvastatin (CRESTOR) 20 MG tablet Take 1 tablet (20 mg total) by mouth at bedtime.  30 tablet  6   No current facility-administered medications for this visit.    Allergies  Allergen Reactions  . Sulfonamide Derivatives Other (See Comments)    UNSURE    Past Medical History  Diagnosis Date  . CAD (coronary artery disease) 02/2009    S/P anterior MI  . Hyperlipemia   . Benign neoplasm of colon   . Unspecified mastoiditis   . Irritable bowel syndrome   . Hypertension   . Diverticulosis   . Colon polyp   . Ischemic cardiomyopathy     EF 25%  . Chronic systolic dysfunction of left ventricle   . Pulmonary embolism   . Chronic renal insufficiency   . Myocardial infarct     Past Surgical History  Procedure Laterality Date  . Total abdominal hysterectomy    . Bilateral salpingoophorectomy    . Inner ear surgery      left x 17  .  Cervical spine surgery  08/08  . Lumbar disc surgery  08/09  . Cad( bare metal stent)  8/10  . Angioplasty  07/02/09, 04/01/10  . Cardiac defibrillator placement  08/05/11    Primary prevention SJM ICD implanted,  Analyze ST study patient    Family History  Problem Relation Age of Onset  . Colon cancer Mother   . Colon cancer Brother   . Cancer Brother     Bladder  . Breast cancer Cousin     History   Social History  . Marital Status: Divorced    Spouse Name: N/A    Number of Children: N/A  . Years of Education: N/A   Occupational History  . Unemployed    Social History Main Topics  . Smoking status: Former Smoker    Quit date: 04/27/2009  . Smokeless tobacco: Not on file  . Alcohol Use: No  . Drug Use: No  . Sexually Active: Not on file   Other Topics Concern  . Not on file   Social History Narrative   Pt lives in [place] alone.  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  All other systems reviewed and negative.  PHYSICAL EXAM:  BP 100/76  Pulse 82  Ht 5\' 2"  (1.575 m)  Wt 159 lb (72.122 kg)  BMI 29.07 kg/m2  SpO2 97% 130/70 by me.    General: Well developed, well nourished, in no acute distress. Head:  Normocephalic and atraumatic. Neck: no JVD Lungs: Clear to auscultation and percussion. Heart: Normal S1 and S2.  No murmur, rubs or gallops.  Abdomen:  Normal bowel sounds; soft; non tender; no organomegaly Pulses: Pulses normal in all 4 extremities. Extremities: No clubbing or cyanosis. No edema. Neurologic: Alert and oriented x 3.  EKG:  ASSESSMENT AND PLAN:

## 2012-11-10 NOTE — Patient Instructions (Addendum)
PLEASE FOLLOW UP WITH DR. Burt Knack IN 2 MONTHS  LAB TODAY; BMET  NO CHANGES WERE MADE TODAY

## 2012-11-13 NOTE — Assessment & Plan Note (Signed)
On combo therapy at present.  She will continue ACE and BB.  Doing well.  ICD is in place.

## 2012-11-13 NOTE — Assessment & Plan Note (Signed)
No longer smoking. Congratulated.

## 2012-11-13 NOTE — Assessment & Plan Note (Signed)
Being followed by [facility].  Has regular follow up for this.

## 2012-11-13 NOTE — Assessment & Plan Note (Signed)
Last LDL was over a year ago, so this should be rechecked.

## 2012-11-15 ENCOUNTER — Encounter: Payer: Self-pay | Admitting: Cardiology

## 2012-11-15 NOTE — Telephone Encounter (Signed)
New problem   Pt calling to get result of labs. Please call pt

## 2012-11-15 NOTE — Telephone Encounter (Signed)
This encounter was created in error - please disregard.

## 2012-11-17 ENCOUNTER — Encounter: Payer: Self-pay | Admitting: Cardiothoracic Surgery

## 2012-11-17 ENCOUNTER — Ambulatory Visit (INDEPENDENT_AMBULATORY_CARE_PROVIDER_SITE_OTHER): Payer: Medicare Other | Admitting: Cardiothoracic Surgery

## 2012-11-17 VITALS — BP 126/84 | HR 102 | Resp 20 | Ht 62.0 in | Wt 159.0 lb

## 2012-11-17 DIAGNOSIS — I712 Thoracic aortic aneurysm, without rupture: Secondary | ICD-10-CM

## 2012-11-17 DIAGNOSIS — I719 Aortic aneurysm of unspecified site, without rupture: Secondary | ICD-10-CM

## 2012-11-17 NOTE — Progress Notes (Signed)
PCP is ANDY,CAMILLE L, MD Referring Provider is Hillary Bow, MD  Chief Complaint  Patient presents with  . 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 The patient has a history of ischemic cardiomyopathy and has had a previous LAD stent placed by Dr. Hyman Hopes following an anterior MI. She denies any chest pain. She is nonsmoking. Her blood pressure is well-controlled. She returns now for a routine followup CTA. There is been no evidence of intramural hematoma or dissection. CTA of the thoracic aorta shows no change from the previous study. No evidence of leak intimal flap or enlargement Past Medical History  Diagnosis Date  . CAD (coronary artery disease) 02/2009    S/P anterior MI  . Hyperlipemia   . Benign neoplasm of colon   . Unspecified mastoiditis   . Irritable bowel syndrome   . Hypertension   . Diverticulosis   . Colon polyp   . Ischemic cardiomyopathy     EF 25%  . Chronic systolic dysfunction of left ventricle   . Pulmonary embolism   . Chronic renal insufficiency   . Myocardial infarct     Past Surgical History  Procedure Laterality Date  . Total abdominal hysterectomy    . Bilateral salpingoophorectomy    . Inner ear surgery      left x 17  . Cervical spine surgery  08/08  . Lumbar disc surgery  08/09  . Cad( bare metal stent)  8/10  . Angioplasty  07/02/09, 04/01/10  . Cardiac defibrillator placement  08/05/11    Primary prevention SJM ICD implanted,  Analyze ST study patient    Family History  Problem Relation Age of Onset  . Colon cancer Mother   . Colon cancer Brother   . Cancer Brother     Bladder  . Breast cancer Cousin     Social History History  Substance Use Topics  . Smoking status: Former Smoker    Quit date: 04/27/2009  . Smokeless tobacco: Not on file  . Alcohol Use: No     Current Outpatient Prescriptions  Medication Sig Dispense Refill  . albuterol (PROAIR HFA) 108 (90 BASE) MCG/ACT inhaler Inhale 2 puffs into the lungs every 6 (six) hours as needed. For shortness of breath      . amitriptyline (ELAVIL) 50 MG tablet Take one every night      . aspirin 81 MG tablet Take 81 mg by mouth daily.       . bisoprolol (ZEBETA) 5 MG tablet Take 5 mg by mouth daily.      Marland Kitchen lisinopril (PRINIVIL,ZESTRIL) 10 MG tablet Take 10 mg by mouth daily.      . montelukast (SINGULAIR) 10 MG tablet Take 10 mg by mouth at bedtime.       Marland Kitchen NEXIUM 40 MG capsule Take as needed      . ondansetron (ZOFRAN) 4 MG tablet Take 1 tablet (4 mg total) by mouth every 6 (six) hours.  12 tablet  0  . polyethylene glycol powder (GLYCOLAX/MIRALAX) powder Take as needed      . rosuvastatin (CRESTOR) 20 MG tablet Take 1 tablet (20 mg total) by mouth at bedtime.  30 tablet  6   No current facility-administered medications for this visit.    Allergies  Allergen Reactions  .  Sulfonamide Derivatives Other (See Comments)    UNSURE    Review of Systems No fever no cough No change in weight No recent hospitalizations No hemoptysis or chest pain BP 126/84  Pulse 102  Resp 20  Ht 5\' 2"  (1.575 m)  Wt 159 lb (72.122 kg)  BMI 29.07 kg/m2  SpO2 96% Physical Exam Alert and comfortable HEENT normocephalic Neck with good pulses no JVD Chest clear lung fields Cardiac regular rhythm without murmur Abdomen soft without pulsatile mass Extremities warm good pulses Neuro intact  Diagnostic Tests: CTA of the thoracic aorta shows no change from 6 months previously with some atherosclerotic changes in a small lesser curve distal arch aneurysm versus ductus bump Impression: Small arch abnormality, 2.5 cm aneurysm, on CTA, no change over 6 months. With good blood pressure control, smoking cessation, and cholesterol control this will probably never required surgery but will need serial CT  surveillance  Plan: Return for CTA in one year

## 2012-11-22 ENCOUNTER — Other Ambulatory Visit: Payer: Self-pay | Admitting: Internal Medicine

## 2012-11-22 ENCOUNTER — Ambulatory Visit (INDEPENDENT_AMBULATORY_CARE_PROVIDER_SITE_OTHER): Payer: Medicaid Other | Admitting: *Deleted

## 2012-11-22 DIAGNOSIS — I2589 Other forms of chronic ischemic heart disease: Secondary | ICD-10-CM

## 2012-11-22 DIAGNOSIS — Z95810 Presence of automatic (implantable) cardiac defibrillator: Secondary | ICD-10-CM

## 2012-11-22 DIAGNOSIS — I255 Ischemic cardiomyopathy: Secondary | ICD-10-CM

## 2012-11-23 LAB — REMOTE ICD DEVICE
DEVICE MODEL ICD: 1016523
RV LEAD AMPLITUDE: 12 mv
TZON-0003SLOWVT: 340 ms
TZON-0004SLOWVT: 24
TZON-0005SLOWVT: 6
VENTRICULAR PACING ICD: 0 pct

## 2012-11-26 ENCOUNTER — Telehealth: Payer: Self-pay

## 2012-11-26 NOTE — Telephone Encounter (Signed)
She needs a fasting lipid profile. Please have her come in. Thanks.

## 2012-12-15 ENCOUNTER — Encounter: Payer: Self-pay | Admitting: *Deleted

## 2012-12-19 ENCOUNTER — Emergency Department (HOSPITAL_COMMUNITY)
Admission: EM | Admit: 2012-12-19 | Discharge: 2012-12-19 | Disposition: A | Discharge status: Home / Self Care | Payer: Medicare Other | Attending: Emergency Medicine | Admitting: Emergency Medicine

## 2012-12-19 ENCOUNTER — Encounter (HOSPITAL_COMMUNITY): Payer: Self-pay | Admitting: *Deleted

## 2012-12-19 ENCOUNTER — Emergency Department (HOSPITAL_COMMUNITY): Payer: Medicare Other

## 2012-12-19 DIAGNOSIS — Z8669 Personal history of other diseases of the nervous system and sense organs: Secondary | ICD-10-CM | POA: Insufficient documentation

## 2012-12-19 DIAGNOSIS — I2510 Atherosclerotic heart disease of native coronary artery without angina pectoris: Secondary | ICD-10-CM | POA: Insufficient documentation

## 2012-12-19 DIAGNOSIS — Z7982 Long term (current) use of aspirin: Secondary | ICD-10-CM | POA: Insufficient documentation

## 2012-12-19 DIAGNOSIS — Z79899 Other long term (current) drug therapy: Secondary | ICD-10-CM | POA: Insufficient documentation

## 2012-12-19 DIAGNOSIS — Z860100 Personal history of colon polyps, unspecified: Secondary | ICD-10-CM | POA: Insufficient documentation

## 2012-12-19 DIAGNOSIS — Z8719 Personal history of other diseases of the digestive system: Secondary | ICD-10-CM | POA: Insufficient documentation

## 2012-12-19 DIAGNOSIS — J069 Acute upper respiratory infection, unspecified: Secondary | ICD-10-CM | POA: Insufficient documentation

## 2012-12-19 DIAGNOSIS — R079 Chest pain, unspecified: Secondary | ICD-10-CM | POA: Insufficient documentation

## 2012-12-19 DIAGNOSIS — F17200 Nicotine dependence, unspecified, uncomplicated: Secondary | ICD-10-CM | POA: Insufficient documentation

## 2012-12-19 DIAGNOSIS — Z8679 Personal history of other diseases of the circulatory system: Secondary | ICD-10-CM | POA: Insufficient documentation

## 2012-12-19 DIAGNOSIS — Z86711 Personal history of pulmonary embolism: Secondary | ICD-10-CM | POA: Insufficient documentation

## 2012-12-19 DIAGNOSIS — Z95810 Presence of automatic (implantable) cardiac defibrillator: Secondary | ICD-10-CM | POA: Insufficient documentation

## 2012-12-19 DIAGNOSIS — K589 Irritable bowel syndrome without diarrhea: Secondary | ICD-10-CM | POA: Insufficient documentation

## 2012-12-19 DIAGNOSIS — R6883 Chills (without fever): Secondary | ICD-10-CM | POA: Insufficient documentation

## 2012-12-19 DIAGNOSIS — R110 Nausea: Secondary | ICD-10-CM | POA: Insufficient documentation

## 2012-12-19 DIAGNOSIS — I252 Old myocardial infarction: Secondary | ICD-10-CM | POA: Insufficient documentation

## 2012-12-19 DIAGNOSIS — R05 Cough: Secondary | ICD-10-CM | POA: Insufficient documentation

## 2012-12-19 DIAGNOSIS — N189 Chronic kidney disease, unspecified: Secondary | ICD-10-CM | POA: Insufficient documentation

## 2012-12-19 DIAGNOSIS — E785 Hyperlipidemia, unspecified: Secondary | ICD-10-CM | POA: Insufficient documentation

## 2012-12-19 DIAGNOSIS — I129 Hypertensive chronic kidney disease with stage 1 through stage 4 chronic kidney disease, or unspecified chronic kidney disease: Secondary | ICD-10-CM | POA: Insufficient documentation

## 2012-12-19 DIAGNOSIS — J45901 Unspecified asthma with (acute) exacerbation: Secondary | ICD-10-CM | POA: Insufficient documentation

## 2012-12-19 DIAGNOSIS — R059 Cough, unspecified: Secondary | ICD-10-CM | POA: Insufficient documentation

## 2012-12-19 HISTORY — DX: Unspecified asthma, uncomplicated: J45.909

## 2012-12-19 MED ORDER — GUAIFENESIN 100 MG/5ML PO LIQD: 200.0000 mg | ORAL | Status: DC | PRN

## 2012-12-19 MED ORDER — CETIRIZINE HCL 1 MG/ML PO SYRP: 5.0000 mg | ORAL_SOLUTION | Freq: Every day | ORAL | Status: DC

## 2012-12-19 MED ORDER — ALBUTEROL SULFATE (5 MG/ML) 0.5% IN NEBU
5.0000 mg | INHALATION_SOLUTION | Freq: Once | RESPIRATORY_TRACT | Status: AC
  Administered 2012-12-19: 5 mg via RESPIRATORY_TRACT
  Filled 2012-12-19: qty 1

## 2012-12-19 NOTE — ED Notes (Signed)
Pt from home with reports of 3 days of a nonproductive cough, nasal congestion and nausea. Pt reports that chest and right rib cage are sore from coughing. Pt reports taking Benadryl for symptoms. Pt denies fever.

## 2012-12-19 NOTE — ED Notes (Signed)
Patient transported to X-ray 

## 2012-12-19 NOTE — ED Provider Notes (Signed)
History     CSN: [id number]  Arrival date & time 12/19/12  1308   First MD Initiated Contact with Patient 12/19/12 1325      Chief Complaint  Patient presents with  . Cough  . Nasal Congestion  . Nausea    (Consider location/radiation/quality/duration/timing/severity/associated sxs/prior treatment) HPI Pt is a 67yo female with hx of CAD and asthma c/o 3 day hx of nonproductive cough, associated with nasal congestion, and nausea.  Pt also reports chest and lower ribcage is sore from coughing.  Pain is mild to moderate, waxes and wanes, achy and occasionally sharp, worse with cough. Pt has taken Benadryl with minimal relief.  Pt has not tried albuterol inhaler.  Denies fever, vomiting, or diarrhea.  Pt is a smoker.    Past Medical History  Diagnosis Date  . CAD (coronary artery disease) 02/2009    S/P anterior MI  . Hyperlipemia   . Benign neoplasm of colon   . Unspecified mastoiditis   . Irritable bowel syndrome   . Hypertension   . Diverticulosis   . Colon polyp   . Ischemic cardiomyopathy     EF 25%  . Chronic systolic dysfunction of left ventricle   . Pulmonary embolism   . Chronic renal insufficiency   . Myocardial infarct   . Asthma     Past Surgical History  Procedure Laterality Date  . Total abdominal hysterectomy    . Bilateral salpingoophorectomy    . Inner ear surgery      left x 17  . Cervical spine surgery  08/08  . Lumbar disc surgery  08/09  . Cad( bare metal stent)  8/10  . Angioplasty  07/02/09, 04/01/10  . Cardiac defibrillator placement  08/05/11    Primary prevention SJM ICD implanted,  Analyze ST study patient    Family History  Problem Relation Age of Onset  . Colon cancer Mother   . Colon cancer Brother   . Cancer Brother     Bladder  . Breast cancer Cousin     History  Substance Use Topics  . Smoking status: Current Every Day Smoker -- 0.25 packs/day    Types: Cigarettes    Last Attempt to Quit: 04/27/2009  . Smokeless tobacco: Never  Used  . Alcohol Use: No    OB History   Grav Para Term Preterm Abortions TAB SAB Ect Mult Living                  Review of Systems  Constitutional: Positive for chills. Negative for fever and fatigue.  Respiratory: Positive for cough and shortness of breath. Negative for choking, chest tightness, wheezing and stridor.   Cardiovascular: Positive for chest pain ( "from coughing").  Gastrointestinal: Negative for nausea, vomiting and diarrhea.    Allergies  Sulfonamide derivatives  Home Medications   Current Outpatient Rx  Name  Route  Sig  Dispense  Refill  . albuterol (PROAIR HFA) 108 (90 BASE) MCG/ACT inhaler   Inhalation   Inhale 2 puffs into the lungs every 6 (six) hours as needed. For shortness of breath         . amitriptyline (ELAVIL) 50 MG tablet      Take one every night         . aspirin 81 MG tablet   Oral   Take 81 mg by mouth daily.          . bisoprolol (ZEBETA) 5 MG tablet   Oral   Take 5  mg by mouth daily.         . diphenhydrAMINE (BENADRYL) 25 MG tablet   Oral   Take 25 mg by mouth every 6 (six) hours as needed for itching.         Marland Kitchen lisinopril (PRINIVIL,ZESTRIL) 10 MG tablet   Oral   Take 10 mg by mouth daily.         . montelukast (SINGULAIR) 10 MG tablet   Oral   Take 10 mg by mouth at bedtime.          Marland Kitchen NEXIUM 40 MG capsule      Take as needed         . ondansetron (ZOFRAN) 4 MG tablet   Oral   Take 1 tablet (4 mg total) by mouth every 6 (six) hours.   12 tablet   0   . polyethylene glycol powder (GLYCOLAX/MIRALAX) powder      Take as needed         . rosuvastatin (CRESTOR) 20 MG tablet   Oral   Take 1 tablet (20 mg total) by mouth at bedtime.   30 tablet   6   . cetirizine (ZYRTEC) 1 MG/ML syrup   Oral   Take 5 mLs (5 mg total) by mouth daily.   118 mL   12   . guaiFENesin (ROBITUSSIN) 100 MG/5ML liquid   Oral   Take 10-15 mLs (200-300 mg total) by mouth every 4 (four) hours as needed for cough.    60 mL   0     BP 137/76  Pulse 96  Temp(Src) 98.9 F (37.2 C) (Oral)  Resp 18  Ht 5\' 2"  (1.575 m)  Wt 158 lb (71.668 kg)  BMI 28.89 kg/m2  SpO2 97%  Physical Exam  Nursing note and vitals reviewed. Constitutional: She appears well-developed and well-nourished. No distress.  Pt was sitting in exam room, appeared fatigued, occasional series of productive coughs  HENT:  Head: Normocephalic and atraumatic.  Eyes: Conjunctivae are normal. No scleral icterus.  Neck: Normal range of motion. Neck supple.  Cardiovascular: Normal rate, regular rhythm and normal heart sounds.   Pulmonary/Chest: Effort normal. No respiratory distress. She has wheezes ( slight bibasilar wheeze). She has no rales. She exhibits tenderness ( anterior chest and bilateral lower chest).  Abdominal: Soft. Bowel sounds are normal. She exhibits no distension and no mass. There is no tenderness. There is no rebound and no guarding.  Musculoskeletal: Normal range of motion.  Neurological: She is alert.  Skin: Skin is warm and dry. She is not diaphoretic.    ED Course  Procedures (including critical care time)  Labs Reviewed - No data to display Dg Chest 2 View  12/19/2012   *RADIOLOGY REPORT*  Clinical Data: Congestion and shortness of breath.  Chest pain. History of COPD, hypertension, CAD, asthma.  CHEST - 2 VIEW  Comparison: 10/09/2012  Findings: Patient has left-sided AICD, lead overlying the right ventricle.  Heart is enlarged.  No pulmonary edema.  There are no focal consolidations or pleural effusions.  Patient has had previous cervical fusion.  Degenerative changes are seen in the thoracic spine.  IMPRESSION: Cardiomegaly without pulmonary edema.   Original Report Authenticated By: Nolon Nations, M.D.     1. URI, acute       MDM  Pt with asthma and CAD c/o 3 day hx of nonproductive cough. Has inhaler at home, has not tried.  Tx in ED: albuterol neb, pt stated that did  help.  Not concerned for CAD or  PE at this time.  Likely viral URI.  CXR: cardiomegaly w/o pulmonary edema, no focal consolidations or pleural effusions  Rx: certirizine and guaifenesin. May also use albuterol every 4-6hrs as needed for cough.  Tylenol and ibuprofen as needed for pain.  F/u with Dr. Billey Chang, PCP in 3-4 days. Vitals: unremarkable. Discharged in stable condition.    Discussed pt with attending during ED encounter.       Noland Fordyce, PA-C 12/20/12 1731

## 2012-12-19 NOTE — ED Notes (Signed)
RT called for breathing tx, will monitor.

## 2012-12-20 LAB — ICD DEVICE OBSERVATION
DEVICE MODEL ICD: 1016523
TZON-0003SLOWVT: 340 ms
TZON-0004SLOWVT: 24
TZON-0005SLOWVT: 6

## 2012-12-21 NOTE — ED Provider Notes (Signed)
Medical screening examination/treatment/procedure(s) were performed by non-physician practitioner and as supervising physician I was immediately available for consultation/collaboration.   Alfonzo Feller, DO 12/21/12 1354

## 2012-12-23 NOTE — Telephone Encounter (Signed)
I spoke with the pt and she would like to wait until after her June appointment with Dr Burt Knack to be scheduled for lab work. I have made an appointment note that this pt needs labs.

## 2012-12-25 ENCOUNTER — Emergency Department (HOSPITAL_COMMUNITY)
Admission: EM | Admit: 2012-12-25 | Discharge: 2012-12-26 | Disposition: A | Discharge status: Home / Self Care | Payer: Medicare Other | Attending: Emergency Medicine | Admitting: Emergency Medicine

## 2012-12-25 DIAGNOSIS — IMO0002 Reserved for concepts with insufficient information to code with codable children: Secondary | ICD-10-CM | POA: Insufficient documentation

## 2012-12-25 DIAGNOSIS — F17200 Nicotine dependence, unspecified, uncomplicated: Secondary | ICD-10-CM | POA: Insufficient documentation

## 2012-12-25 DIAGNOSIS — I129 Hypertensive chronic kidney disease with stage 1 through stage 4 chronic kidney disease, or unspecified chronic kidney disease: Secondary | ICD-10-CM | POA: Insufficient documentation

## 2012-12-25 DIAGNOSIS — R1110 Vomiting, unspecified: Secondary | ICD-10-CM | POA: Insufficient documentation

## 2012-12-25 DIAGNOSIS — Z86711 Personal history of pulmonary embolism: Secondary | ICD-10-CM | POA: Insufficient documentation

## 2012-12-25 DIAGNOSIS — I252 Old myocardial infarction: Secondary | ICD-10-CM | POA: Insufficient documentation

## 2012-12-25 DIAGNOSIS — E785 Hyperlipidemia, unspecified: Secondary | ICD-10-CM | POA: Insufficient documentation

## 2012-12-25 DIAGNOSIS — J45909 Unspecified asthma, uncomplicated: Secondary | ICD-10-CM | POA: Insufficient documentation

## 2012-12-25 DIAGNOSIS — J069 Acute upper respiratory infection, unspecified: Secondary | ICD-10-CM | POA: Insufficient documentation

## 2012-12-25 DIAGNOSIS — Z95810 Presence of automatic (implantable) cardiac defibrillator: Secondary | ICD-10-CM | POA: Insufficient documentation

## 2012-12-25 DIAGNOSIS — G43909 Migraine, unspecified, not intractable, without status migrainosus: Secondary | ICD-10-CM | POA: Insufficient documentation

## 2012-12-25 DIAGNOSIS — Z8679 Personal history of other diseases of the circulatory system: Secondary | ICD-10-CM | POA: Insufficient documentation

## 2012-12-25 DIAGNOSIS — I2510 Atherosclerotic heart disease of native coronary artery without angina pectoris: Secondary | ICD-10-CM | POA: Insufficient documentation

## 2012-12-25 DIAGNOSIS — H53149 Visual discomfort, unspecified: Secondary | ICD-10-CM | POA: Insufficient documentation

## 2012-12-25 DIAGNOSIS — Z79899 Other long term (current) drug therapy: Secondary | ICD-10-CM | POA: Insufficient documentation

## 2012-12-25 DIAGNOSIS — N189 Chronic kidney disease, unspecified: Secondary | ICD-10-CM | POA: Insufficient documentation

## 2012-12-25 DIAGNOSIS — Z792 Long term (current) use of antibiotics: Secondary | ICD-10-CM | POA: Insufficient documentation

## 2012-12-25 DIAGNOSIS — Z8719 Personal history of other diseases of the digestive system: Secondary | ICD-10-CM | POA: Insufficient documentation

## 2012-12-25 DIAGNOSIS — Z860100 Personal history of colon polyps, unspecified: Secondary | ICD-10-CM | POA: Insufficient documentation

## 2012-12-25 DIAGNOSIS — Z8669 Personal history of other diseases of the nervous system and sense organs: Secondary | ICD-10-CM | POA: Insufficient documentation

## 2012-12-25 DIAGNOSIS — Z7982 Long term (current) use of aspirin: Secondary | ICD-10-CM | POA: Insufficient documentation

## 2012-12-25 NOTE — ED Notes (Signed)
Pt mentions she has had a cough, but has been seen by MD for.

## 2012-12-25 NOTE — ED Provider Notes (Signed)
History     CSN: [id number]  Arrival date & time 12/25/12  2309   First MD Initiated Contact with Patient 12/25/12 2351      Chief Complaint  Patient presents with  . Migraine    (Consider location/radiation/quality/duration/timing/severity/associated sxs/prior treatment) HPI  Patient presents to the ED with complaints of migraine that started at 2pm today. She has been sick with a cough for two weeks and started Azithromycin for it today. She took the medication at 11am and began vomiting approx 2 hours afterwards. Onset of headache and vomiting started simultaneously. She has had greater than 10 episodes of vomiting. She has a PMH of migraines and this feels the same as her normal. Top of her head and a pressure/sharp pain. She is having phonophobia, did not try to take any medication for headache at home. Pt actively vomiting during HPI. No fevers, SOB, CP, abdominal pain, generalized or focal weakness, no confusion. 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 Patients BP is elevated at 150/126 on autmoatic BP cuff. I have ordered the nurse to a manual BP check.  Past Medical History  Diagnosis Date  . CAD (coronary artery disease) 02/2009    S/P anterior MI  . Hyperlipemia   . Benign neoplasm of colon   . Unspecified mastoiditis   . Irritable bowel syndrome   . Hypertension   . Diverticulosis   . Colon polyp   . Ischemic cardiomyopathy     EF 25%  . Chronic systolic dysfunction of left ventricle   . Pulmonary embolism   . Chronic renal insufficiency   . Myocardial infarct   . Asthma     Past Surgical History  Procedure Laterality Date  . Total abdominal hysterectomy    . Bilateral salpingoophorectomy    . Inner ear surgery      left x 17  . Cervical spine surgery  08/08  . Lumbar disc surgery  08/09  . Cad( bare metal stent)  8/10  . Angioplasty  07/02/09, 04/01/10  . Cardiac defibrillator placement  08/05/11    Primary prevention SJM ICD implanted,  Analyze ST study  patient    Family History  Problem Relation Age of Onset  . Colon cancer Mother   . Colon cancer Brother   . Cancer Brother     Bladder  . Breast cancer Cousin     History  Substance Use Topics  . Smoking status: Current Every Day Smoker -- 0.25 packs/day    Types: Cigarettes    Last Attempt to Quit: 04/27/2009  . Smokeless tobacco: Never Used  . Alcohol Use: No    OB History   Grav Para Term Preterm Abortions TAB SAB Ect Mult Living                  Review of Systems  All other systems reviewed and are negative.    Allergies  Sulfonamide derivatives  Home Medications   Current Outpatient Rx  Name  Route  Sig  Dispense  Refill  . albuterol (PROAIR HFA) 108 (90 BASE) MCG/ACT inhaler   Inhalation   Inhale 2 puffs into the lungs every 6 (six) hours as needed. For shortness of breath         . amitriptyline (ELAVIL) 50 MG tablet   Oral   Take 50 mg by mouth at bedtime. Take one every night         . aspirin 81 MG tablet   Oral   Take 81  mg by mouth every morning.          Marland Kitchen azithromycin (ZITHROMAX) 250 MG tablet   Oral   Take 250-500 mg by mouth daily. Take 2 tablets on day 1 and take 1 tablet daily for the next 4 days         . bisoprolol (ZEBETA) 5 MG tablet   Oral   Take 7.5 mg by mouth every morning.          . cetirizine (ZYRTEC) 10 MG tablet   Oral   Take 10 mg by mouth daily as needed for allergies.         Marland Kitchen guaiFENesin (ROBITUSSIN) 100 MG/5ML liquid   Oral   Take 10-15 mLs (200-300 mg total) by mouth every 4 (four) hours as needed for cough.   60 mL   0   . HYDROcodone-homatropine (HYCODAN) 5-1.5 MG/5ML syrup   Oral   Take 5 mLs by mouth every 4 (four) hours as needed for cough.         Marland Kitchen lisinopril (PRINIVIL,ZESTRIL) 10 MG tablet   Oral   Take 10 mg by mouth every morning.          . montelukast (SINGULAIR) 10 MG tablet   Oral   Take 10 mg by mouth at bedtime.          . predniSONE (DELTASONE) 20 MG tablet    Oral   Take 60 mg by mouth every morning.         . rosuvastatin (CRESTOR) 20 MG tablet   Oral   Take 1 tablet (20 mg total) by mouth at bedtime.   30 tablet   6   . amoxicillin (AMOXIL) 500 MG capsule   Oral   Take 1 capsule (500 mg total) by mouth 3 (three) times daily.   21 capsule   0     BP 149/89  Pulse 84  Temp(Src) 98.7 F (37.1 C) (Oral)  Resp 20  SpO2 95%  Physical Exam  Nursing note and vitals reviewed. Constitutional: She is oriented to person, place, and time. She appears well-developed and well-nourished. She appears distressed (actively vomiting during exam).  HENT:  Head: Normocephalic and atraumatic.  Eyes: Pupils are equal, round, and reactive to light.  Neck: Normal range of motion. Neck supple.  Cardiovascular: Normal rate and regular rhythm.   Pulmonary/Chest: Effort normal.  Abdominal: Soft. Bowel sounds are normal. There is no tenderness. There is no rigidity, no guarding and no CVA tenderness. No hernia.  Neurological: She is alert and oriented to person, place, and time. She has normal strength. No cranial nerve deficit or sensory deficit.  Skin: Skin is warm and dry.    ED Course  Procedures (including critical care time)  Labs Reviewed  CBC WITH DIFFERENTIAL - Abnormal; Notable for the following:    WBC 10.8 (*)    Basophils Relative 3 (*)    Basophils Absolute 0.3 (*)    All other components within normal limits  COMPREHENSIVE METABOLIC PANEL - Abnormal; Notable for the following:    GFR calc non Af Amer 54 (*)    GFR calc Af Amer 62 (*)    All other components within normal limits  LIPASE, BLOOD   Dg Chest 2 View  12/26/2012   *RADIOLOGY REPORT*  Clinical Data: Cough.  Vomiting.  CHEST - 2 VIEW  Comparison: 12/19/2012  Findings: Mild cardiomegaly stable.  A single lead transvenous pacemaker remains in appropriate position.  Both lungs  are clear. No evidence of pleural effusion.  No mass or lymphadenopathy identified.  IMPRESSION:  Stable mild cardiomegaly.  No active lung disease.   Original Report Authenticated By: Earle Gell, M.D.   Ct Head Wo Contrast  12/26/2012   *RADIOLOGY REPORT*  Clinical Data: Headache, emesis  CT HEAD WITHOUT CONTRAST  Technique:  Contiguous axial images were obtained from the base of the skull through the vertex without contrast.  Comparison: 04/26/2012  Findings: There is no evidence for acute hemorrhage, hydrocephalus, mass lesion, or abnormal extra-axial fluid collection.  No definite CT evidence for acute infarction.  The visualized paranasal sinuses and mastoid air cells are predominately clear. Postoperative changes of mastoidectomy on the left.  IMPRESSION: No CT evidence for acute intracranial abnormality.   Original Report Authenticated By: Carlos Levering, M.D.     1. Migraine   2. URI (upper respiratory infection)       MDM  Chest xray and head CT ordered NS saline IV, Zofran, Decadron, Morphine and Benadryl ordered as well as basic labs.  Head CT came back with no abnormalities. Chest xray is also none acute. Labs are WNL. Patients headache and nausea/vomiting have completely resolved.  Discussed with Dr. Sharol Given and she is comfortable with me discharging patient. Pt to follow up with PCP this week.  Pt has been advised of the symptoms that warrant their return to the ED. Patient has voiced understanding and has agreed to follow-up with the PCP or specialist.      Linus Mako, PA-C 12/26/12 [phone]

## 2012-12-25 NOTE — ED Notes (Signed)
Pt accompanied with brother, present to the ED with Migraine Headache, onset today around 12:00 noon. Pt states she has had 8 emesis episodes today.  Denies photosensitivity but has is sensitive to sound.

## 2012-12-26 ENCOUNTER — Emergency Department (HOSPITAL_COMMUNITY): Payer: Medicare Other

## 2012-12-26 LAB — COMPREHENSIVE METABOLIC PANEL
ALT: 12 U/L (ref 0–35)
AST: 18 U/L (ref 0–37)
Alkaline Phosphatase: 92 U/L (ref 39–117)
CO2: 27 mEq/L (ref 19–32)
Chloride: 103 mEq/L (ref 96–112)
GFR calc Af Amer: 62 mL/min — ABNORMAL LOW (ref >90)
GFR calc non Af Amer: 54 mL/min — ABNORMAL LOW (ref >90)
Glucose, Bld: 95 mg/dL (ref 70–99)
Potassium: 3.9 mEq/L (ref 3.5–5.1)
Sodium: 141 mEq/L (ref 135–145)
Total Bilirubin: 0.4 mg/dL (ref 0.3–1.2)

## 2012-12-26 LAB — CBC WITH DIFFERENTIAL/PLATELET
Basophils Absolute: 0.3 10*3/uL — ABNORMAL HIGH (ref 0.0–0.1)
Eosinophils Absolute: 0.1 10*3/uL (ref 0.0–0.7)
Lymphs Abs: 3.2 10*3/uL (ref 0.7–4.0)
MCH: 32.8 pg (ref 26.0–34.0)
MCHC: 34.6 g/dL (ref 30.0–36.0)
MCV: 95 fL (ref 78.0–100.0)
Monocytes Absolute: 0.9 10*3/uL (ref 0.1–1.0)
Monocytes Relative: 8 % (ref 3–12)
Neutro Abs: 6.3 10*3/uL (ref 1.7–7.7)
Platelets: 242 10*3/uL (ref 150–400)
RDW: 13.2 % (ref 11.5–15.5)
WBC: 10.8 10*3/uL — ABNORMAL HIGH (ref 4.0–10.5)

## 2012-12-26 MED ORDER — MORPHINE SULFATE 4 MG/ML IJ SOLN
4.0000 mg | Freq: Once | INTRAMUSCULAR | Status: AC
  Administered 2012-12-26: 4 mg via INTRAVENOUS
  Filled 2012-12-26: qty 1

## 2012-12-26 MED ORDER — DIPHENHYDRAMINE HCL 50 MG/ML IJ SOLN
25.0000 mg | Freq: Once | INTRAMUSCULAR | Status: AC
  Administered 2012-12-26: 25 mg via INTRAVENOUS
  Filled 2012-12-26 (×2): qty 1

## 2012-12-26 MED ORDER — SODIUM CHLORIDE 0.9 % IV BOLUS (SEPSIS)
1000.0000 mL | Freq: Once | INTRAVENOUS | Status: AC
  Administered 2012-12-26: 1000 mL via INTRAVENOUS

## 2012-12-26 MED ORDER — ONDANSETRON HCL 4 MG/2ML IJ SOLN
4.0000 mg | Freq: Once | INTRAMUSCULAR | Status: AC
  Administered 2012-12-26: 4 mg via INTRAVENOUS
  Filled 2012-12-26: qty 2

## 2012-12-26 MED ORDER — AMOXICILLIN 500 MG PO CAPS: 500.0000 mg | ORAL_CAPSULE | Freq: Three times a day (TID) | ORAL | Status: DC

## 2012-12-26 MED ORDER — DEXAMETHASONE SODIUM PHOSPHATE 10 MG/ML IJ SOLN
10.0000 mg | Freq: Once | INTRAMUSCULAR | Status: AC
  Administered 2012-12-26: 10 mg via INTRAVENOUS
  Filled 2012-12-26: qty 1

## 2012-12-26 NOTE — ED Provider Notes (Signed)
Medical screening examination/treatment/procedure(s) were performed by non-physician practitioner and as supervising physician I was immediately available for consultation/collaboration.  Kalman Drape, MD 12/26/12 [phone]

## 2012-12-29 ENCOUNTER — Encounter: Payer: Self-pay | Admitting: Internal Medicine

## 2012-12-30 ENCOUNTER — Telehealth: Payer: Self-pay | Admitting: Neurology

## 2012-12-30 NOTE — Telephone Encounter (Signed)
I called pt and she is better from ER visit (received benadryl, nausea med and morphine), but continues with on and off again headaches, w/ some nausea.   She was last seen by CM/NP 04/2012.  New provider to be Dr. Krista Blue.  Made appt for 01-03-13 at 1400, be here 1345.  Pt verbalized understanding.

## 2013-01-03 ENCOUNTER — Encounter: Payer: Self-pay | Admitting: Nurse Practitioner

## 2013-01-03 ENCOUNTER — Ambulatory Visit (INDEPENDENT_AMBULATORY_CARE_PROVIDER_SITE_OTHER): Payer: Medicare Other | Admitting: Nurse Practitioner

## 2013-01-03 VITALS — BP 128/76 | HR 73 | Ht 63.0 in | Wt 157.0 lb

## 2013-01-03 DIAGNOSIS — R519 Headache, unspecified: Secondary | ICD-10-CM

## 2013-01-03 DIAGNOSIS — R51 Headache: Secondary | ICD-10-CM

## 2013-01-03 HISTORY — DX: Headache, unspecified: R51.9

## 2013-01-03 NOTE — Patient Instructions (Addendum)
Will continue Elavil at night Given headache diary to keep a record of migraines Given migraine triggers again Will followup in 2 months

## 2013-01-03 NOTE — Progress Notes (Signed)
HPI: Patient returns for followup of her last visit 05/24/2012. She has a history of headaches as well as essential tremor. She is a previous patient of Dr. Erling Cruz in this office. She has had one ER visit for headache since last seen. She has a history of multiple operations of the left mastoid between the ages of 66 and 23 she is currently on disability for that reason. She has been on Topamax in the past with good control of headaches but had side effects, history of heart attack, congestive heart failure, COPD irritable bowel syndrome, hyperlipidemia, acute renal failure with stage III chronic renal disease and metabolic acidosis from Topamax. She was started on low-dose Depakote by Dr. Erling Cruz but stopped the medication for unknown reasons. CT of the brain has been normal, she has not kept a calendar of her headaches and cannot give me the number of headaches that she has on a monthly basis. She is currently being treated for a sinus infection with antibiotic  ROS:  Chills, ringing in the ears, cough, constipation, easy bruising, allergies, dizziness, headache  Physical Exam General: well developed, well nourished, seated, in no evident distress Head: head normocephalic and atraumatic. Oropharynx benign Neck: supple with no carotid  bruits Cardiovascular: regular rate and rhythm, no murmurs  Neurologic Exam Mental Status: Awake and fully alert. Oriented to place and time. Follows all commands. Speech and language normal.  ESS 0 Cranial Nerves: Fundoscopic exam reveals flat disc margins. Pupils equal, briskly reactive to light. Extraocular movements full without nystagmus. Visual fields full to confrontation. Hearing decreased on the left  Face, tongue, palate move normally and symmetrically. Neck flexion and extension normal.  Motor: Normal bulk and tone. Normal strength in all tested extremity muscles.No focal weakness Sensory.: intact to touch and pinprick and vibratory.  Coordination: Rapid  alternating movements normal in all extremities. Finger-to-nose and heel-to-shin performed accurately bilaterally. Outstretched hand and arm tremor Gait and Station: Arises from chair without difficulty. Stance is wide-based Able to heel, toe and unsteady with tandem walk.  Reflexes: 2+ and symmetric. Toes downgoing.     ASSESSMENT: Headaches, migraine, currently has a sinus headache on antibiotic     PLAN: Will continue Elavil at night Given headache diary to keep a record of migraines Given migraine triggers again Will followup in 2 months  Dennie Bible, GNP-BC APRN

## 2013-01-10 ENCOUNTER — Telehealth: Payer: Self-pay | Admitting: Gastroenterology

## 2013-01-10 MED ORDER — AMITRIPTYLINE HCL 50 MG PO TABS: 50.0000 mg | ORAL_TABLET | Freq: Every day | ORAL | Status: DC

## 2013-01-10 NOTE — Telephone Encounter (Signed)
yes

## 2013-01-10 NOTE — Telephone Encounter (Signed)
Dr Deatra Ina, This pt wants a refill of Amitriptyline.. I dont see that we prescribed it ... Do you want me to refill

## 2013-01-10 NOTE — Telephone Encounter (Signed)
Sent in script for pt per dr Deatra Ina

## 2013-01-21 ENCOUNTER — Encounter: Payer: Self-pay | Admitting: Cardiovascular Disease

## 2013-01-21 ENCOUNTER — Ambulatory Visit (INDEPENDENT_AMBULATORY_CARE_PROVIDER_SITE_OTHER): Payer: Medicare Other | Admitting: Cardiovascular Disease

## 2013-01-21 VITALS — BP 126/80 | HR 76 | Ht 62.0 in | Wt 159.8 lb

## 2013-01-21 DIAGNOSIS — E7800 Pure hypercholesterolemia, unspecified: Secondary | ICD-10-CM

## 2013-01-21 DIAGNOSIS — I2510 Atherosclerotic heart disease of native coronary artery without angina pectoris: Secondary | ICD-10-CM

## 2013-01-21 NOTE — Patient Instructions (Addendum)
Your physician recommends that you return for a FASTING LIPID and LIVER profile--nothing to eat or drink after midnight, lab opens at 7:30 (January 27, 2013)  Your physician wants you to follow-up in: 6 MONTHS with Dr Burt Knack.  You will receive a reminder letter in the mail two months in advance. If you don't receive a letter, please call our office to schedule the follow-up appointment.  Your physician recommends that you continue on your current medications as directed. Please refer to the Current Medication list given to you today.

## 2013-01-21 NOTE — Progress Notes (Signed)
HPI:  67 year old woman presenting for followup evaluation. She's been followed by Dr. [name] in the past. The patient has coronary artery disease with history of anterior wall MI and then presented with stent thrombosis in 2011. She presented late into the course of an anterior MI and was treated with aspiration thrombectomy and angioplasty. She has had severe residual LV dysfunction with an ejection fraction less than 30%. An ICD has been placed. She presents today for regular followup. She's had problems with GI bleeding with severe anemia a few years back in came off of Plavix at that time. She maintains on low-dose aspirin and is doing well at the present time. She denies chest pain or pressure, edema, orthopnea, or PND. She does have shortness of breath with activity. She quit smoking about 3 months ago.  Outpatient Encounter Prescriptions as of 01/21/2013  Medication Sig Dispense Refill  . ADVAIR DISKUS 250-50 MCG/DOSE AEPB       . albuterol (PROAIR HFA) 108 (90 BASE) MCG/ACT inhaler Inhale 2 puffs into the lungs every 6 (six) hours as needed. For shortness of breath      . amitriptyline (ELAVIL) 50 MG tablet Take 1 tablet (50 mg total) by mouth at bedtime. Take one every night  30 tablet  2  . aspirin 81 MG tablet Take 81 mg by mouth every morning.       . bisoprolol (ZEBETA) 5 MG tablet Take 5 mg by mouth daily.       . cetirizine (ZYRTEC) 10 MG tablet Take 10 mg by mouth daily as needed for allergies.      Marland Kitchen lisinopril (PRINIVIL,ZESTRIL) 10 MG tablet Take 10 mg by mouth every morning.       . montelukast (SINGULAIR) 10 MG tablet Take 10 mg by mouth at bedtime.       Marland Kitchen NEXIUM 40 MG capsule as needed.      . Polyethylene Glycol POWD by Does not apply route as needed.      . rosuvastatin (CRESTOR) 20 MG tablet Take 1 tablet (20 mg total) by mouth at bedtime.  30 tablet  6  . [DISCONTINUED] amoxicillin (AMOXIL) 500 MG capsule Take 1 capsule (500 mg total) by mouth 3 (three) times daily.  21  capsule  0  . [DISCONTINUED] azithromycin (ZITHROMAX) 250 MG tablet Take 250-500 mg by mouth daily. Take 2 tablets on day 1 and take 1 tablet daily for the next 4 days      . [DISCONTINUED] guaiFENesin (ROBITUSSIN) 100 MG/5ML liquid Take 10-15 mLs (200-300 mg total) by mouth every 4 (four) hours as needed for cough.  60 mL  0  . [DISCONTINUED] ondansetron (ZOFRAN) 4 MG tablet       . [DISCONTINUED] predniSONE (DELTASONE) 20 MG tablet       . [DISCONTINUED] promethazine (PHENERGAN) 25 MG tablet        No facility-administered encounter medications on file as of 01/21/2013.    Allergies  Allergen Reactions  . Sulfonamide Derivatives Other (See Comments)    UNSURE    Past Medical History  Diagnosis Date  . CAD (coronary artery disease) 02/2009    S/P anterior MI  . Hyperlipemia   . Benign neoplasm of colon   . Unspecified mastoiditis   . Irritable bowel syndrome   . Hypertension   . Diverticulosis   . Colon polyp   . Ischemic cardiomyopathy     EF 25%  . Chronic systolic dysfunction of left ventricle   .  Pulmonary embolism   . Chronic renal insufficiency   . Myocardial infarct   . Asthma   . Headache(784.0)     ROS: Negative except as per HPI  BP 126/80  Pulse 76  Ht 5\' 2"  (1.575 m)  Wt 159 lb 12.8 oz (72.485 kg)  BMI 29.22 kg/m2  PHYSICAL EXAM: Pt is alert and oriented, NAD HEENT: normal Neck: JVP - normal, carotids 2+= without bruits Lungs: CTA bilaterally CV: RRR without murmur or gallop Abd: soft, NT, Positive BS, no hepatomegaly Ext: no C/C/E, distal pulses intact and equal Skin: warm/dry no rash  2-D echo 05/22/2011: Left ventricle: Septal apical and anterior wall hypokinesis. Cannot R/O mural apical thrombus Suggest MRI or contrast study The cavity size was severely dilated. Wall thickness was normal. The estimated ejection fraction was 25%.  ------------------------------------------------------------ Aortic valve: Mildly thickened  leaflets.  ------------------------------------------------------------ Aorta: The aorta was normal, not dilated, and non-diseased.  ------------------------------------------------------------ Mitral valve: Doppler: Trivial regurgitation. Peak gradient: 29mm Hg (D).  ------------------------------------------------------------ Left atrium: The atrium was mildly dilated.  ------------------------------------------------------------ Atrial septum: No defect or patent foramen ovale was identified.  ------------------------------------------------------------ Right ventricle: The cavity size was normal. Wall thickness was normal. Systolic function was normal.  ------------------------------------------------------------ Pulmonic valve: Doppler: Mild regurgitation.  ------------------------------------------------------------ Tricuspid valve: Doppler: Mild regurgitation.  ------------------------------------------------------------ Right atrium: The atrium was normal in size.  ------------------------------------------------------------ Pericardium: The pericardium was normal in appearance.  ------------------------------------------------------------ Post procedure conclusions Ascending Aorta:  - The aorta was normal, not dilated, and non-diseased.  ASSESSMENT AND PLAN: 1. Coronary artery disease status post anterior MI. Stable on aspirin alone. With history of serious bleeding we'll continue the same.  2. Ischemic cardiomyopathy with New York Heart Association class II congestive heart failure. Mainly limited by shortness of breath. Difficult to know the etiology since she's been on long-term smoker. She has no evidence of volume overload on exam. She will continue on bisoprolol and lisinopril which she seems to be tolerating well.  3. Hyperlipidemia. The patient is on Crestor 20 mg. I reviewed her previous lipids. She is due for a repeat panel along with LFTs.  [name] 01/21/2013 4:28 PM

## 2013-01-27 ENCOUNTER — Other Ambulatory Visit (INDEPENDENT_AMBULATORY_CARE_PROVIDER_SITE_OTHER): Payer: Medicare Other

## 2013-01-27 DIAGNOSIS — E7800 Pure hypercholesterolemia, unspecified: Secondary | ICD-10-CM

## 2013-01-27 DIAGNOSIS — I2510 Atherosclerotic heart disease of native coronary artery without angina pectoris: Secondary | ICD-10-CM

## 2013-01-27 LAB — HEPATIC FUNCTION PANEL
AST: 16 U/L (ref 0–37)
Albumin: 4 g/dL (ref 3.5–5.2)
Alkaline Phosphatase: 86 U/L (ref 39–117)
Total Bilirubin: 0.6 mg/dL (ref 0.3–1.2)

## 2013-01-27 LAB — LIPID PANEL
LDL Cholesterol: 61 mg/dL (ref 0–99)
Total CHOL/HDL Ratio: 3
Triglycerides: 164 mg/dL — ABNORMAL HIGH (ref 0.0–149.0)

## 2013-01-31 ENCOUNTER — Ambulatory Visit (INDEPENDENT_AMBULATORY_CARE_PROVIDER_SITE_OTHER): Payer: Medicare Other | Admitting: *Deleted

## 2013-01-31 DIAGNOSIS — I255 Ischemic cardiomyopathy: Secondary | ICD-10-CM

## 2013-01-31 DIAGNOSIS — I2589 Other forms of chronic ischemic heart disease: Secondary | ICD-10-CM

## 2013-01-31 LAB — ICD DEVICE OBSERVATION
DEV-0020ICD: NEGATIVE
HV IMPEDENCE: 74 Ohm
RV LEAD AMPLITUDE: 12 mv
TOT-0007: 1
TOT-0009: 1
TOT-0010: 5
TZON-0003SLOWVT: 340 ms
TZON-0005SLOWVT: 6
TZON-0010SLOWVT: 40 ms
VENTRICULAR PACING ICD: 0 pct
VF: 0

## 2013-01-31 NOTE — Progress Notes (Signed)
ICD check with industry for research.

## 2013-02-08 ENCOUNTER — Other Ambulatory Visit: Payer: Self-pay | Admitting: Neurosurgery

## 2013-02-08 DIAGNOSIS — M545 Low back pain: Secondary | ICD-10-CM

## 2013-02-11 ENCOUNTER — Ambulatory Visit
Admission: RE | Admit: 2013-02-11 | Discharge: 2013-02-11 | Disposition: A | Discharge status: Home / Self Care | Payer: Medicare Other | Source: Ambulatory Visit | Attending: Neurosurgery | Admitting: Neurosurgery

## 2013-02-11 DIAGNOSIS — M545 Low back pain: Secondary | ICD-10-CM

## 2013-02-14 ENCOUNTER — Encounter: Payer: Self-pay | Admitting: Internal Medicine

## 2013-03-22 ENCOUNTER — Other Ambulatory Visit: Payer: Self-pay | Admitting: *Deleted

## 2013-03-22 MED ORDER — LISINOPRIL 10 MG PO TABS: 10.0000 mg | ORAL_TABLET | Freq: Every morning | ORAL | Status: DC

## 2013-04-25 ENCOUNTER — Ambulatory Visit (INDEPENDENT_AMBULATORY_CARE_PROVIDER_SITE_OTHER): Payer: Medicare Other | Admitting: Neurology

## 2013-04-25 ENCOUNTER — Encounter: Payer: Self-pay | Admitting: Neurology

## 2013-04-25 VITALS — Ht 62.0 in | Wt 156.0 lb

## 2013-04-25 DIAGNOSIS — F17200 Nicotine dependence, unspecified, uncomplicated: Secondary | ICD-10-CM

## 2013-04-25 DIAGNOSIS — I2589 Other forms of chronic ischemic heart disease: Secondary | ICD-10-CM

## 2013-04-25 DIAGNOSIS — R110 Nausea: Secondary | ICD-10-CM

## 2013-04-25 DIAGNOSIS — I255 Ischemic cardiomyopathy: Secondary | ICD-10-CM

## 2013-04-25 DIAGNOSIS — H7090 Unspecified mastoiditis, unspecified ear: Secondary | ICD-10-CM

## 2013-04-25 MED ORDER — PROCHLORPERAZINE MALEATE 5 MG PO TABS: 5.0000 mg | ORAL_TABLET | Freq: Four times a day (QID) | ORAL | Status: DC | PRN

## 2013-04-25 MED ORDER — BUTALBITAL-APAP-CAFFEINE 50-325-40 MG PO TABS: 1.0000 | ORAL_TABLET | Freq: Two times a day (BID) | ORAL | Status: DC | PRN

## 2013-04-25 NOTE — Progress Notes (Signed)
  History of Present Illness  67 year old right-handed white divorced female with  a history of headaches and essential tremor. Patient of Dr. Erling Cruz, Last visit was with Hoyle Sauer in June 9th 2014.  She  has history of multiple operations on the left mastoid between the ages of 67 and 75. She is currently on disability for that reason. She has CAD, PE, she has  congestive heart failure, COPD, irritable bowel syndrome, left ankle fracture, hyperlipidemia,  acute renal failure with stage III chronic renal disease, metabolic acidosis from Topamax, pulmonary embolism 03/2010, and a defibrillator was placed 08/2011.   She has history of migraine since age 67s, she complains of vertex area pressure headache, she has 2-6 headaches each month, She is taking amitriptyline 50mg  qhs, which has been helpful.  She took overcounter medication for nause, as needed, her headache can last all day long, ASA make her stomach hurt,   She brought her headache diarrhea, she had 4-6 headache each month,  Review of Systems  Out of a complete 14 system review, the patient complains of only the following symptoms, and all other reviewed systems are negative.  Hearing loss, trouble swallowing, ringing ears, easy bruising, constipation, memory loss, headaches, difficulty swallowing, dizziness, insomnia, depression  Social History  She lives at home alone. She is divorced.  Pt is disabled.  She has an 11th grade education.  She is single.  She has two children, Sons 36 and 46.  She consumes 2 cups of caffiene per day. Denies ever using illicit drugs.  Quit tobacco in 2011.  Denies alcohol. -Finished 11th grade in school.  Any Tobacco Use: Quit Inhaled Tobacco Use: former smoker  Family History  Her father died at age 55 in a MVA.  Her mother died at age 28 with colon cancer: She has a brother that is positive for bladder cancer Another brother 21 and a sister 42 living and well.    Past Medical History  She is positive for  hypertension, hyperlipidemia, chronic pain, MI 2010 and 2011, PE 2011, Renal Failure 2011,COPD Pacemaker defibrillator  Migraine headaches Metabolic acidosis from Topamax  renal failure. Pulmonary embolism. Irritable bowel syndrome.  Surgical History  neck,  left lumbarback 2010, nose, multiple  left ear operations, hysterectomy at age 67, [place] 08/2011,  nasal reconstruction   Physical Exam  General: well-developed white female.   Neurologic Exam  Mental Status:  Alert and oriented x3. Follows one, 2, 3 step commands.   Cranial Nerves:  visual fields are full. Both discs are seen  and  flat.Extraocular movements are full.Facial  sensation and strength is equal. Hearing  is decreased on the left bone conduction greater than air conduction left.  Tongue midline, uvula midline, gags present sternocleidomastoid and trapezius testing normal. Motor:  5/5 in upper and lower extremities Sensory:  intact to pinprick, touch, joint position, and vibration.  Coordination:  Outstretched hand and arm tremor. Gait and Station:  Can toe walk. Can heel walk.Cannot perform tandem. Reflexes: 2+ reflexes with downgoing plantar responses   Assessment and plan:  67 years old right-handed Caucasian female, with past medical history of migraine headaches, also has past medical history of hypertension, hyperlipidemia, coronary artery disease, status post pacemaker placement  1. chronic migraine headaches, continue amitriptyline 50 mg every night as preventive medications  2. Fioricet, compazine as needed for abortive treatment   3. return to clinic in 6 months with Hoyle Sauer

## 2013-04-29 DIAGNOSIS — G3184 Mild cognitive impairment, so stated: Secondary | ICD-10-CM | POA: Insufficient documentation

## 2013-04-29 DIAGNOSIS — H9190 Unspecified hearing loss, unspecified ear: Secondary | ICD-10-CM | POA: Insufficient documentation

## 2013-05-04 ENCOUNTER — Other Ambulatory Visit: Payer: Self-pay | Admitting: Cardiology

## 2013-05-11 DIAGNOSIS — M810 Age-related osteoporosis without current pathological fracture: Secondary | ICD-10-CM | POA: Insufficient documentation

## 2013-05-18 ENCOUNTER — Telehealth: Payer: Self-pay | Admitting: Gastroenterology

## 2013-05-18 ENCOUNTER — Other Ambulatory Visit: Payer: Self-pay | Admitting: Gastroenterology

## 2013-05-18 ENCOUNTER — Encounter: Payer: Self-pay | Admitting: Gastroenterology

## 2013-05-18 NOTE — Telephone Encounter (Signed)
Pt states she has been having some abdominal pain and that her refill for elavil was denied. Pt has not been seen in over a year. Pt scheduled to see Tye Savoy NP tomorrow at 2:30pm. Pt aware of appt.

## 2013-05-19 ENCOUNTER — Ambulatory Visit (INDEPENDENT_AMBULATORY_CARE_PROVIDER_SITE_OTHER): Payer: Medicare Other | Admitting: Physician Assistant

## 2013-05-19 ENCOUNTER — Encounter: Payer: Self-pay | Admitting: Physician Assistant

## 2013-05-19 VITALS — BP 122/80 | HR 88 | Ht 62.0 in | Wt 156.4 lb

## 2013-05-19 DIAGNOSIS — K589 Irritable bowel syndrome without diarrhea: Secondary | ICD-10-CM

## 2013-05-19 DIAGNOSIS — Z86010 Personal history of colonic polyps: Secondary | ICD-10-CM

## 2013-05-19 MED ORDER — GLYCOPYRROLATE 2 MG PO TABS: 2.0000 mg | ORAL_TABLET | Freq: Two times a day (BID) | ORAL | Status: DC

## 2013-05-19 NOTE — Progress Notes (Signed)
Reviewed and agree with management. Willie Loy D. Adhira Jamil, M.D., FACG  

## 2013-05-19 NOTE — Progress Notes (Signed)
Subjective:    Patient ID: [name], female    DOB: [birth year], 67 y.o.   MRN: [id number]  HPI  [name] is a pleasant 67 year old female known to [name] who was last seen about 2 years ago. She has history of IBS and adenomatous colon polyps. She also has a significant ischemic cardiomyopathy with EF estimated at 25% and is status post ICD placement. She has coronary artery disease is status post anterior MI in 2010, and history of hypertension and prior PE. She last had colonoscopy in June of 2012 which was a normal exam. In October of 2011 she had a sessile polyp removed from the cecum this measured 15 mm and was piecemeal removed and also had another 8-10 mm polyp removed from the sigmoid colon both of these were adenomatous. She has been taking amitriptyline for several years which helps control her IBS symptoms and comes back in today with complaints of 2 month history of abdominal pain despite regular use of amitriptyline and routine use of  MiraLax for her constipation. Patient states her pain is not aggravated by by mouth intake but says that she will hurt off and on all day long and seems to be bothered more at nighttime and early in the morning. She describes it as a crampy type of abdominal pain. Her appetite has been fine her weight has been stable. She is not having any problems with nausea or vomiting. She takes occasional NSAIDs but not on a regular basis. She denies any change in her stress level. She states that she has had similar episodes of abdominal pain but has not had this particular pain for several years.    Review of Systems  Constitutional: Negative.   HENT: Negative.   Eyes: Negative.   Respiratory: Negative.   Cardiovascular: Negative.   Gastrointestinal: Positive for abdominal pain and constipation.  Endocrine: Negative.   Genitourinary: Negative.   Musculoskeletal: Negative.   Skin: Negative.   Allergic/Immunologic: Negative.   Neurological: Negative.    Hematological: Negative.   Psychiatric/Behavioral: Negative.    Outpatient Prescriptions Prior to Visit  Medication Sig Dispense Refill  . albuterol (PROAIR HFA) 108 (90 BASE) MCG/ACT inhaler Inhale 2 puffs into the lungs every 6 (six) hours as needed. For shortness of breath      . amitriptyline (ELAVIL) 50 MG tablet Take 1 tablet (50 mg total) by mouth at bedtime. Take one every night  30 tablet  2  . aspirin 81 MG tablet Take 81 mg by mouth every morning.       . bisoprolol (ZEBETA) 5 MG tablet Take 5 mg by mouth daily.       . butalbital-acetaminophen-caffeine (FIORICET, ESGIC) 50-325-40 MG per tablet Take 1 tablet by mouth 2 (two) times daily as needed for headache.  30 tablet  5  . lisinopril (PRINIVIL,ZESTRIL) 10 MG tablet Take 1 tablet (10 mg total) by mouth every morning.  30 tablet  6  . NEXIUM 40 MG capsule as needed.      . Polyethylene Glycol POWD by Does not apply route as needed.      . prochlorperazine (COMPAZINE) 5 MG tablet Take 1 tablet (5 mg total) by mouth every 6 (six) hours as needed for nausea.  30 tablet  3  . rosuvastatin (CRESTOR) 20 MG tablet Take 1 tablet (20 mg total) by mouth at bedtime.  30 tablet  6  . ADVAIR DISKUS 250-50 MCG/DOSE AEPB       . cetirizine (ZYRTEC)  10 MG tablet Take 10 mg by mouth daily as needed for allergies.      . meloxicam (MOBIC) 7.5 MG tablet Take 7.5 mg by mouth daily.      . montelukast (SINGULAIR) 10 MG tablet Take 10 mg by mouth at bedtime.        No facility-administered medications prior to visit.   Allergies  Allergen Reactions  . Sulfonamide Derivatives Other (See Comments)    UNSURE   Patient Active Problem List   Diagnosis Date Noted  . Headache(784.0) 01/03/2013  . 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 Cough 04/11/2012  . Sweating 03/17/2012  . ICD-St.Jude 08/06/2011  . Chronic systolic heart failure [id number]  . Cardiomyopathy, ischemic 06/12/2011  . CAD (coronary artery disease) 06/12/2011  . Ischemic  cardiomyopathy 05/16/2011  . DDD (degenerative disc disease), lumbar 05/16/2011  . History of pulmonary embolus (PE) 01/02/2011  . UNSPECIFIED ANEMIA 07/03/2010  . HYPERTENSION 09/05/2009  . Shortness of breath 08/30/2009  . DIVERTICULOSIS-COLON 08/14/2009  . ABDOMINAL PAIN, LEFT LOWER QUADRANT 08/14/2009  . ABDOMINAL PAIN, GENERALIZED 08/14/2009  . HYPERLIPIDEMIA 04/24/2009  . AMI NOS, unspecified 04/24/2009  . CORONARY ARTERY DISEASE 04/24/2009  . NAUSEA 04/24/2009  . DYSPHAGIA UNSPECIFIED 04/24/2009  . PERSONAL HX COLONIC POLYPS 04/24/2009  . TOBACCO ABUSE 04/04/2009  . HYPERTENSION, BENIGN 04/04/2009  . Coronary atherosclerosis of native coronary artery 04/04/2009  . LEFT VENTRICULAR FUNCTION, DECREASED 04/04/2009  . CHEST PAIN 04/04/2009  . DYSPEPSIA 12/04/2008  . MASTOIDITIS 10/19/2007  . GERD 10/19/2007  . CONSTIPATION 10/19/2007  . IBS 10/19/2007  . COLONIC POLYPS, ADENOMATOUS 05/12/2007   History  Substance Use Topics  . Smoking status: Former Smoker -- 0.25 packs/day    Types: Cigarettes    Quit date: 04/27/2009  . Smokeless tobacco: Never Used  . Alcohol Use: No       Objective:   Physical Exam  well-developed older white female in no acute distress, blood pressure 122/80 pulse 88 height 5 foot 2 weight 156. HEENT; nontraumatic normocephalic EOMI PERRLA sclera anicteric, Supple ;no JVD, Cardiovascular; regular rate and rhythm with S1-S2 she has a soft systolic murmur ICD in left chest wall, Pulmonary ;clear bilaterally, Abdomen; soft she is tender bilateral lower quadrants no guarding or rebound no palpable mass or hepatosplenomegaly no bruit, Rectal; exam not done, Ext; no clubbing cyanosis or edema skin warm and dry, Psych; mood and affect normal and appropriate        Assessment & Plan:  #27 67 year old female with 2 month history of intermittent generalized abdominal pain, crampy in nature and frequently worse in the early morning hours. Patient has history  of IBS and suspect she is having an exacerbation. Another consideration would be a low flow state given her significant ischemic cardiomyopathy. However her symptoms are not exacerbated by by mouth intake. #2 history of adenomatous colon polyp last colonoscopy June 2012 negative due for followup June 2017 #3 ischemic cardiomyopathy status post ICD #4 chronic renal insufficiency #5 coronary artery disease status post MI  Plan; start a line one by mouth daily x2 months Continue amitriptyline 50 mg by mouth daily Add Robinul Forte 2 mg by mouth twice daily for cramping Continue MiraLax 17 g in 8 ounces of fluid every other day Patient is asked to call back for followup if her symptoms have not improved in the next 2 weeks and if her U. probably

## 2013-05-19 NOTE — Patient Instructions (Addendum)
We sent a prescription for Robinul Forte 2 mg to [facility]. We have given you  probiotic Align coupons.  Take 1 cap daily for 2 months. You can get this at any pharmacy, [facility], [facility], [facility].  Take Miralax every other day. Call us for a follow up visit in 2 weeks if you are not feeling significantly better.

## 2013-05-23 ENCOUNTER — Ambulatory Visit (INDEPENDENT_AMBULATORY_CARE_PROVIDER_SITE_OTHER): Payer: Medicare Other | Admitting: Internal Medicine

## 2013-05-23 ENCOUNTER — Encounter: Payer: Self-pay | Admitting: Internal Medicine

## 2013-05-23 VITALS — BP 146/97 | HR 77 | Ht 62.0 in | Wt 158.4 lb

## 2013-05-23 DIAGNOSIS — Z95810 Presence of automatic (implantable) cardiac defibrillator: Secondary | ICD-10-CM

## 2013-05-23 DIAGNOSIS — I255 Ischemic cardiomyopathy: Secondary | ICD-10-CM

## 2013-05-23 DIAGNOSIS — I2589 Other forms of chronic ischemic heart disease: Secondary | ICD-10-CM

## 2013-05-23 DIAGNOSIS — R079 Chest pain, unspecified: Secondary | ICD-10-CM

## 2013-05-23 NOTE — Progress Notes (Signed)
PCP: Leamon Arnt, MD Primary Cardiologist:  Dr Burt Knack (previously Dr [name])  [name] is a 67 y.o. female who presents today for routine electrophysiology followup.  Since last being seen in our clinic, the patient reports doing well.   Today, she denies symptoms of palpitations, exertional chest pain, shortness of breath,  lower extremity edema, dizziness, presyncope, syncope, or ICD shocks.  The patient is otherwise without complaint today.   Past Medical History  Diagnosis Date  . CAD (coronary artery disease) 02/2009    S/P anterior MI  . Hyperlipemia   . Benign neoplasm of colon   . Unspecified mastoiditis   . Irritable bowel syndrome   . Hypertension   . Diverticulosis   . Colon polyp   . Ischemic cardiomyopathy     EF 25%  . Chronic systolic dysfunction of left ventricle   . Pulmonary embolism   . Chronic renal insufficiency   . Myocardial infarct   . Asthma   . Headache(784.0)   . Memory loss    Past Surgical History  Procedure Laterality Date  . Total abdominal hysterectomy    . Bilateral salpingoophorectomy    . Inner ear surgery      left x 17  . Cervical spine surgery  08/08  . Lumbar disc surgery  08/09  . Cad( bare metal stent)  8/10  . Angioplasty  07/02/09, 04/01/10  . Cardiac defibrillator placement  08/05/11    Primary prevention SJM ICD implanted,  Analyze ST study patient    Current Outpatient Prescriptions  Medication Sig Dispense Refill  . albuterol (PROAIR HFA) 108 (90 BASE) MCG/ACT inhaler Inhale 2 puffs into the lungs every 6 (six) hours as needed. For shortness of breath      . alendronate (FOSAMAX) 70 MG tablet       . amitriptyline (ELAVIL) 50 MG tablet Take 1 tablet (50 mg total) by mouth at bedtime. Take one every night  30 tablet  2  . aspirin 81 MG tablet Take 81 mg by mouth every morning.       . bisoprolol (ZEBETA) 5 MG tablet Take 5 mg by mouth daily.       . butalbital-acetaminophen-caffeine (FIORICET, ESGIC) 50-325-40 MG per  tablet Take 1 tablet by mouth 2 (two) times daily as needed for headache.  30 tablet  5  . glycopyrrolate (ROBINUL) 2 MG tablet Take 1 tablet (2 mg total) by mouth 2 (two) times daily.  60 tablet  1  . HYDROcodone-acetaminophen (NORCO/VICODIN) 5-325 MG per tablet As directed      . lisinopril (PRINIVIL,ZESTRIL) 10 MG tablet Take 1 tablet (10 mg total) by mouth every morning.  30 tablet  6  . NEXIUM 40 MG capsule as needed.      . Polyethylene Glycol POWD by Does not apply route as needed.      . prochlorperazine (COMPAZINE) 5 MG tablet Take 1 tablet (5 mg total) by mouth every 6 (six) hours as needed for nausea.  30 tablet  3  . rosuvastatin (CRESTOR) 20 MG tablet Take 1 tablet (20 mg total) by mouth at bedtime.  30 tablet  6   No current facility-administered medications for this visit.    Physical Exam: Filed Vitals:   05/23/13 0948  BP: 146/97  Pulse: 77  Height: 5\' 2"  (1.575 m)  Weight: 158 lb 6.4 oz (71.85 kg)    GEN- The patient is well appearing, alert and oriented x 3 today.   Head- normocephalic,  atraumatic Eyes-  Sclera clear, conjunctiva pink Ears- hearing intact Oropharynx- clear Lungs- Clear to ausculation bilaterally, normal work of breathing Chest- ICD pocket is well healed Heart- Regular rate and rhythm, no murmurs, rubs or gallops, PMI not laterally displaced GI- soft, NT, ND, + BS Extremities- no clubbing, cyanosis, or edema  ICD interrogation- reviewed in detail today,  See PACEART report  Assessment and Plan:  Chronic systolic heart failure  Normal ICD function  See Pace Art report  No changes today   Cardiomyopathy, ischemic  No ischemic symptoms  No chf on exam   Follow-up with Dr Vangie Bicker I will see again in 1 year

## 2013-05-23 NOTE — Patient Instructions (Signed)
Your physician wants you to follow-up in: 12 months with Dr Vallery Ridge will receive a reminder letter in the mail two months in advance. If you don't receive a letter, please call our office to schedule the follow-up appointment.   Remote monitoring is used to monitor your Pacemaker or ICD from home. This monitoring reduces the number of office visits required to check your device to one time per year. It allows [place] to keep an eye on the functioning of your device to ensure it is working properly. You are scheduled for a device check from home on 08/23/13. You may send your transmission at any time that day. If you have a wireless device, the transmission will be sent automatically. After your physician reviews your transmission, you will receive a postcard with your next transmission date.

## 2013-05-24 ENCOUNTER — Telehealth: Payer: Self-pay | Admitting: *Deleted

## 2013-05-25 MED ORDER — TRAMADOL HCL 50 MG PO TABS: 50.0000 mg | ORAL_TABLET | Freq: Four times a day (QID) | ORAL | Status: DC | PRN

## 2013-05-25 NOTE — Telephone Encounter (Signed)
I have called her, she was taking Fioricet 4-5 tabs/q 2weeks, she complains of stomach pain from the medications. Since she stopped the medications, her stomach feels better.  I have advised her to stop Fioricet, start Ultram prn for her headaches.

## 2013-05-26 ENCOUNTER — Telehealth: Payer: Self-pay | Admitting: Neurology

## 2013-05-27 LAB — ICD DEVICE OBSERVATION
TZON-0003SLOWVT: 340 ms
TZON-0004SLOWVT: 24
TZON-0005SLOWVT: 6
TZON-0010SLOWVT: 40 ms

## 2013-06-13 NOTE — Telephone Encounter (Signed)
I have not had access to Epic for over 2 weeks.  By viewing chart, this was already taken care of.

## 2013-07-19 ENCOUNTER — Ambulatory Visit (INDEPENDENT_AMBULATORY_CARE_PROVIDER_SITE_OTHER): Payer: Medicare Other | Admitting: Cardiovascular Disease

## 2013-07-19 ENCOUNTER — Encounter: Payer: Self-pay | Admitting: Cardiovascular Disease

## 2013-07-19 VITALS — BP 124/82 | HR 104 | Ht 62.0 in | Wt 158.0 lb

## 2013-07-19 DIAGNOSIS — R079 Chest pain, unspecified: Secondary | ICD-10-CM

## 2013-07-19 DIAGNOSIS — I2510 Atherosclerotic heart disease of native coronary artery without angina pectoris: Secondary | ICD-10-CM

## 2013-07-19 DIAGNOSIS — I2589 Other forms of chronic ischemic heart disease: Secondary | ICD-10-CM

## 2013-07-19 DIAGNOSIS — I255 Ischemic cardiomyopathy: Secondary | ICD-10-CM

## 2013-07-19 MED ORDER — ESOMEPRAZOLE MAGNESIUM 40 MG PO CPDR: 40.0000 mg | DELAYED_RELEASE_CAPSULE | Freq: Every day | ORAL | Status: DC

## 2013-07-19 NOTE — Patient Instructions (Addendum)
Your physician wants you to follow-up in: 1 year with Dr. Burt Knack.  You will receive a reminder letter in the mail two months in advance. If you don't receive a letter, please call our office to schedule the follow-up appointment.  Your physician has requested that you have a lexiscan myoview. For further information please visit [email or web address]. Please follow instruction sheet, as given.  Your physician has recommended you make the following change in your medication:  Take Nexium 40 mg daily  You are due to see Dr. Prescott Gum in April 2015.  You can call for appointments:  [phone]

## 2013-07-19 NOTE — Progress Notes (Signed)
HPI:  67 year old woman presenting for followup evaluation. The patient has a history of coronary disease with anterior wall MI. She had stent thrombosis in 2011. She's had severe residual left ventricular systolic dysfunction and has undergone ICD implantation. She's been maintained only on low-dose aspirin because of GI bleeding on dual antiplatelet therapy. Last cholesterol panel in July 2014 showed a total cholesterol of 139, triglycerides 164, HDL 45, and LDL 61. Liver function tests were within normal limits.  The patient has had for 5 episodes of chest discomfort at rest. These have occurred at times when she wakes up in the morning. She describes this as a "strong pain." It is located in the center of her chest and is nonradiating. Symptoms last anywhere from a few minutes up to one hour. She has been uncertain if this is related to her heart or indicative of acid reflux. She's had no edema, palpitations, lightheadedness, or syncope. She quit smoking about one year ago. She does admit to exertional dyspnea at less than one block of walking. This is unchanged over time.  Outpatient Encounter Prescriptions as of 07/19/2013  Medication Sig  . albuterol (PROAIR HFA) 108 (90 BASE) MCG/ACT inhaler Inhale 2 puffs into the lungs every 6 (six) hours as needed. For shortness of breath  . alendronate (FOSAMAX) 70 MG tablet   . amitriptyline (ELAVIL) 50 MG tablet Take 1 tablet (50 mg total) by mouth at bedtime. Take one every night  . aspirin 81 MG tablet Take 81 mg by mouth every morning.   . bisoprolol (ZEBETA) 5 MG tablet Take 5 mg by mouth daily.   Marland Kitchen glycopyrrolate (ROBINUL) 2 MG tablet Take 1 tablet (2 mg total) by mouth 2 (two) times daily.  Marland Kitchen HYDROcodone-acetaminophen (NORCO/VICODIN) 5-325 MG per tablet As directed  . lisinopril (PRINIVIL,ZESTRIL) 10 MG tablet Take 1 tablet (10 mg total) by mouth every morning.  Marland Kitchen NEXIUM 40 MG capsule as needed.  . Polyethylene Glycol POWD by Does not apply  route as needed.  . prochlorperazine (COMPAZINE) 5 MG tablet Take 1 tablet (5 mg total) by mouth every 6 (six) hours as needed for nausea.  . rosuvastatin (CRESTOR) 20 MG tablet Take 1 tablet (20 mg total) by mouth at bedtime.  . traMADol (ULTRAM) 50 MG tablet Take 1 tablet (50 mg total) by mouth every 6 (six) hours as needed for pain.    Allergies  Allergen Reactions  . Sulfonamide Derivatives Other (See Comments)    UNSURE    Past Medical History  Diagnosis Date  . CAD (coronary artery disease) 02/2009    S/P anterior MI  . Hyperlipemia   . Benign neoplasm of colon   . Unspecified mastoiditis   . Irritable bowel syndrome   . Hypertension   . Diverticulosis   . Colon polyp   . Ischemic cardiomyopathy     EF 25%  . Chronic systolic dysfunction of left ventricle   . Pulmonary embolism   . Chronic renal insufficiency   . Myocardial infarct   . Asthma   . Headache(784.0)   . Memory loss     ROS: Negative except as per HPI  BP 124/82  Pulse 104  Ht 5\' 2"  (1.575 m)  Wt 158 lb (71.668 kg)  BMI 28.89 kg/m2  PHYSICAL EXAM: Pt is alert and oriented, NAD HEENT: normal Neck: JVP - normal, carotids 2+= without bruits Lungs: CTA bilaterally CV: RRR without murmur or gallop Abd: soft, NT, Positive BS, no hepatomegaly Ext: no  C/C/E, distal pulses intact and equal Skin: warm/dry no rash  EKG:  Sinus tachycardia 104 beats per minute, biatrial enlargement, pulmonary disease pattern, left anterior fascicular block.  ASSESSMENT AND PLAN: 1. Coronary artery disease, native vessel. History of anterior MI and stent thrombosis. Recurrence of chest pain with typical and atypical features. Advised Lexiscan rest/stress Myoview to rule out ischemia. Also advised start Nexium daily rather than as needed. Continue current medical program.  2. Severe ischemic cardiomyopathy with New York Heart Association class III symptoms. She will remain on a combination of lisinopril and bisoprolol.  Suspect dyspnea is multifactorial related to long-standing smoking and cardiomyopathy. She is status post ICD implantation and is followed by Dr. Rayann Heman.  3. Hypertension. Blood pressure is controlled on current medical program.  4. Hyperlipidemia. Lipids at goal and Crestor 20 mg daily.  5. Aortic arch abnormality. Followed by Dr. Prescott Gum. She will be due for a CTA this coming Spring.  [name] 07/19/2013 11:31 AM

## 2013-08-08 ENCOUNTER — Ambulatory Visit (HOSPITAL_COMMUNITY): Payer: Medicare Other | Attending: Cardiovascular Disease | Admitting: Radiology

## 2013-08-08 ENCOUNTER — Encounter: Payer: Self-pay | Admitting: Cardiology

## 2013-08-08 VITALS — BP 135/90 | HR 80 | Ht 62.0 in | Wt 157.0 lb

## 2013-08-08 DIAGNOSIS — I10 Essential (primary) hypertension: Secondary | ICD-10-CM | POA: Insufficient documentation

## 2013-08-08 DIAGNOSIS — R0609 Other forms of dyspnea: Secondary | ICD-10-CM | POA: Insufficient documentation

## 2013-08-08 DIAGNOSIS — R0789 Other chest pain: Secondary | ICD-10-CM | POA: Insufficient documentation

## 2013-08-08 DIAGNOSIS — R079 Chest pain, unspecified: Secondary | ICD-10-CM

## 2013-08-08 DIAGNOSIS — R0989 Other specified symptoms and signs involving the circulatory and respiratory systems: Secondary | ICD-10-CM | POA: Insufficient documentation

## 2013-08-08 DIAGNOSIS — I2510 Atherosclerotic heart disease of native coronary artery without angina pectoris: Secondary | ICD-10-CM | POA: Insufficient documentation

## 2013-08-08 DIAGNOSIS — Z95810 Presence of automatic (implantable) cardiac defibrillator: Secondary | ICD-10-CM | POA: Insufficient documentation

## 2013-08-08 DIAGNOSIS — Z87891 Personal history of nicotine dependence: Secondary | ICD-10-CM | POA: Insufficient documentation

## 2013-08-08 DIAGNOSIS — I252 Old myocardial infarction: Secondary | ICD-10-CM | POA: Insufficient documentation

## 2013-08-08 DIAGNOSIS — E785 Hyperlipidemia, unspecified: Secondary | ICD-10-CM | POA: Insufficient documentation

## 2013-08-08 DIAGNOSIS — J45909 Unspecified asthma, uncomplicated: Secondary | ICD-10-CM | POA: Insufficient documentation

## 2013-08-08 DIAGNOSIS — Z9861 Coronary angioplasty status: Secondary | ICD-10-CM | POA: Insufficient documentation

## 2013-08-08 MED ORDER — TECHNETIUM TC 99M SESTAMIBI GENERIC - CARDIOLITE
11.0000 | Freq: Once | INTRAVENOUS | Status: AC | PRN
Start: 2013-08-08 — End: 2013-08-08
  Administered 2013-08-08: 11 via INTRAVENOUS

## 2013-08-08 MED ORDER — REGADENOSON 0.4 MG/5ML IV SOLN
0.4000 mg | Freq: Once | INTRAVENOUS | Status: AC
  Administered 2013-08-08: 0.4 mg via INTRAVENOUS

## 2013-08-08 MED ORDER — TECHNETIUM TC 99M SESTAMIBI GENERIC - CARDIOLITE
33.0000 | Freq: Once | INTRAVENOUS | Status: AC | PRN
Start: 2013-08-08 — End: 2013-08-08
  Administered 2013-08-08: 33 via INTRAVENOUS

## 2013-08-08 NOTE — Progress Notes (Signed)
[facility] [address] [place], [place] [address] [phone]    Cardiology Nuclear Med Study  [name] is a 68 y.o. female     MRN : [id number]     DOB: 10-24-1945  Procedure Date: 08/08/2013  Nuclear Med Background Indication for Stress Test:  Evaluation for Ischemia, Stent Patency and PTCA Patency History:  CAD; MI; PTCA/Stent-LAD; AICD; Asthma; Echo 2012-EF 25%; MPI 2012-anteroseptal/anterapical infarct, EF 30% Cardiac Risk Factors: History of Smoking, Hypertension and Lipids  Symptoms:  Chest Pain (last date of chest discomfort was two days ago) and DOE   Nuclear Pre-Procedure Caffeine/Decaff Intake:  None NPO After: 6:00pm    Lungs:  clear O2 Sat: 94% on room air. IV 0.9% NS with Angio Cath:  22g  IV Site: R Forearm  IV Started by:  Annye Rusk, CNMT  Chest Size (in):  36 Cup Size: DD  Height: 5\' 2"  (1.575 m)  Weight:  157 lb (71.215 kg)  BMI:  Body mass index is 28.71 kg/(m^2). Tech Comments:  No meds this am    Nuclear Med Study 1 or 2 day study: 1 day  Stress Test Type:  Treadmill/Lexiscan  Reading MD: N/A  Order Authorizing Provider:  Ezzie Dural, MD  Resting Radionuclide: Technetium 55m Sestamibi  Resting Radionuclide Dose: 11.0 mCi   Stress Radionuclide:  Technetium 22m Sestamibi  Stress Radionuclide Dose: 33.0 mCi           Stress Protocol Rest HR: 80 Stress HR: 117  Rest BP: 135/90 Stress BP: 99/56  Exercise Time (min): n/a METS: n/a           Dose of Adenosine (mg):  n/a Dose of Lexiscan: 0.4 mg  Dose of Atropine (mg): n/a Dose of Dobutamine: n/a mcg/kg/min (at max HR)  Stress Test Technologist: Glade Lloyd, BS-ES  Nuclear Technologist:  Charlton Amor, CNMT     Rest Procedure:  Myocardial perfusion imaging was performed at rest 45 minutes following the intravenous administration of Technetium 37m Sestamibi. Rest ECG: LAFB  Stress Procedure:  The patient received IV Lexiscan 0.4 mg over 15-seconds with  concurrent low level exercise and then Technetium 35m Sestamibi was injected at 30-seconds while the patient continued walking one more minute.  Quantitative spect images were obtained after a 45-minute delay.  During the infusion of Lexiscan, the patient complained of chest heaviness and lightheadedness.  These symptoms resolved in recovery.  Stress ECG: No significant change from baseline ECG  QPS Raw Data Images:  Normal; no motion artifact; normal heart/lung ratio. Stress Images:  There is decreased uptake in the apex. Rest Images:  There is decreased uptake in the apex. Subtraction (SDS):  No reversibility is appreciated. Transient Ischemic Dilatation (Normal <1.22):  0.99 Lung/Heart Ratio (Normal <0.45):  0.36  Quantitative Gated Spect Images QGS EDV:  157 ml QGS ESV:  117 ml  Impression Exercise Capacity:  Lexiscan with low level exercise. BP Response:  Hypotensive blood pressure response. Clinical Symptoms:  Chest heaviness. ECG Impression:  No significant ECG changes with Lexiscan. Comparison with Prior Nuclear Study: No images to compare  Overall Impression:  High risk stress nuclear study.  There is a large area of scar of severe severity  involving the apex and apical anterior and apical septal segments. There is no significant  reversibility. There is significant LV systolic dysfunction with EF 25% and apical akinesis.  LV Ejection Fraction: 25%.  LV Wall Motion:  Large area of apical akinesis.  Darlin Coco

## 2013-08-12 ENCOUNTER — Telehealth: Payer: Self-pay | Admitting: Cardiovascular Disease

## 2013-08-12 NOTE — Telephone Encounter (Signed)
Pt called for the stress test result. Stress test was  done on 08/08/13. Pt is aware that Dr. Burt Knack needs to review the results and  make recommendations. Pt would like to have the results ASAP.

## 2013-08-12 NOTE — Telephone Encounter (Signed)
new message     Pt is requesting stress test results.

## 2013-08-15 NOTE — Telephone Encounter (Signed)
See result note. Myoview showed scar without ischemia. Would continue with medical therapy as long as no exertional angina.  [name] 08/15/2013 4:36 PM

## 2013-08-15 NOTE — Telephone Encounter (Signed)
Left pt a message to call back. Or I will call pt tomorrow AM.

## 2013-08-16 NOTE — Telephone Encounter (Signed)
Left pt a message to call back. 

## 2013-08-17 ENCOUNTER — Telehealth: Payer: Self-pay | Admitting: Neurology

## 2013-08-17 NOTE — Telephone Encounter (Signed)
HAVING BAD HEADACHE--NEEDS APPT SOON

## 2013-08-18 NOTE — Telephone Encounter (Signed)
Reviewed myoview results with patient who verbalized understanding.  Patient will call back if she develops symptoms of exertional angina.

## 2013-08-19 NOTE — Telephone Encounter (Signed)
I called pt she is having Level 10 headaches since Wednesday.  Top of head feeling of pressure.  Taking elavil as prescribed, compazine for nausea, is vomiting.   The tramadol is not working.  She had been on fiorcet before this which she was causing her stomach problems.  Allergic to sulfas.  [facility] on [address]  Call [phone], then [phone]

## 2013-08-19 NOTE — Telephone Encounter (Signed)
Chart reviewed, I called her at [phone].  68 year old right-handed white divorced female with a history of headaches and essential tremor. Patient of Dr. Erling Cruz, Last visit was with Hoyle Sauer in June 9th 2014 .  She has history of multiple operations on the left mastoid between the ages of 36 and 50. She is currently on disability for that reason. She has CAD, PE, she has congestive heart failure, COPD, irritable bowel syndrome, left ankle fracture, hyperlipidemia, acute renal failure with stage III chronic renal disease, metabolic acidosis from Topamax, pulmonary embolism 03/2010, and a defibrillator was placed 08/2011.  She has history of migraine since age 3s, she complains of vertex area pressure headache, she has 2-6 headaches each month, She is taking amitriptyline 50mg  qhs, which has been helpful.  She took overcounter medication for nause, as needed, her headache can last all day long, ASA make her stomach hurt,  She brought her headache diary, she had 4-6 headache each month,   She has 5 bad,  headaches over past month, she has tried tramadol 50mg  tid q4 hours,which is helpful.  she complains of nause,  Now her headache  Butch Penny, please give her a follow up visit with Hoyle Sauer or me in next available.

## 2013-08-19 NOTE — Telephone Encounter (Signed)
Patient calling again to follow up, states the headaches are so bad she is throwing up. Please call to schedule.

## 2013-08-19 NOTE — Telephone Encounter (Signed)
Patient scheduled on 08-23-13, bringing medications to appointment.

## 2013-08-23 ENCOUNTER — Ambulatory Visit (INDEPENDENT_AMBULATORY_CARE_PROVIDER_SITE_OTHER): Payer: Medicare Other | Admitting: Neurology

## 2013-08-23 ENCOUNTER — Encounter: Payer: Self-pay | Admitting: Neurology

## 2013-08-23 ENCOUNTER — Encounter (INDEPENDENT_AMBULATORY_CARE_PROVIDER_SITE_OTHER): Payer: Self-pay

## 2013-08-23 ENCOUNTER — Telehealth: Payer: Self-pay | Admitting: Neurology

## 2013-08-23 VITALS — BP 144/91 | HR 81 | Ht 62.0 in | Wt 157.0 lb

## 2013-08-23 DIAGNOSIS — G43909 Migraine, unspecified, not intractable, without status migrainosus: Secondary | ICD-10-CM

## 2013-08-23 MED ORDER — ONDANSETRON HCL 4 MG/5ML PO SOLN: 4.0000 mg | Freq: Once | ORAL | Status: DC

## 2013-08-23 MED ORDER — TOPIRAMATE 50 MG PO TABS: 50.0000 mg | ORAL_TABLET | Freq: Two times a day (BID) | ORAL | Status: DC

## 2013-08-23 MED ORDER — ONDANSETRON HCL 4 MG PO TABS: 4.0000 mg | ORAL_TABLET | ORAL | Status: DC | PRN

## 2013-08-23 NOTE — Progress Notes (Signed)
[facility]  PATIENT: [name] DOB: March 25, 1946  HISTORICAL   [name] is a 68 year old right-handed white divorced female with  a history of headaches and essential tremor. Patient of Dr. Erling Roberts,   She  has history of multiple operations on the left mastoid between the ages of 61 and 69. She is currently on disability for that reason. She has CAD, PE, she has  congestive heart failure, COPD, irritable bowel syndrome, left ankle fracture, hyperlipidemia,  acute renal failure with stage III chronic renal disease, metabolic acidosis from Topamax, pulmonary embolism 03/2010, and a defibrillator was placed 08/2011.   She has history of migraine since age 70s, she complains of vertex area pressure headache, she has 2-6 headaches each month, She is taking amitriptyline 50mg  qhs, which has been helpful.  She took overcounter medication for nause, as needed, her headache can last all day long, ASA make her stomach hurt,   She brought her headache diarrhea, she had 4-6 headache each month,  UPDATE Jan 27th 2015:  Her headaches is overall much better, less frequent, but 5 headche very severe in 2 months, she had nause, vomitting with it. She wants more medication to help her preventing her severe migraines  Fioricet helps her some. But not the severe headaches.   REVIEW OF SYSTEMS: Full 14 system review of systems performed and notable only for dizziness, headache, tremor, constipation, nausea, vomiting insomnia, infrequent awakening, trouble swallowing, ringing in ears, hearing loss, ear discharge  ALLERGIES: Allergies  Allergen Reactions  . Sulfonamide Derivatives Other (See Comments)    UNSURE    HOME MEDICATIONS: Outpatient Prescriptions Prior to Visit  Medication Sig Dispense Refill  . albuterol (PROAIR HFA) 108 (90 BASE) MCG/ACT inhaler Inhale 2 puffs into the lungs every 6 (six) hours as needed. For shortness of breath      . alendronate (FOSAMAX) 70 MG  tablet       . amitriptyline (ELAVIL) 50 MG tablet Take 1 tablet (50 mg total) by mouth at bedtime. Take one every night  30 tablet  2  . aspirin 81 MG tablet Take 81 mg by mouth every morning.       . bisoprolol (ZEBETA) 5 MG tablet Take 5 mg by mouth daily.       Marland Kitchen esomeprazole (NEXIUM) 40 MG capsule Take 1 capsule (40 mg total) by mouth daily.      Marland Kitchen glycopyrrolate (ROBINUL) 2 MG tablet Take 1 tablet (2 mg total) by mouth 2 (two) times daily.  60 tablet  1  . HYDROcodone-acetaminophen (NORCO/VICODIN) 5-325 MG per tablet As directed      . lisinopril (PRINIVIL,ZESTRIL) 10 MG tablet Take 1 tablet (10 mg total) by mouth every morning.  30 tablet  6  . Polyethylene Glycol POWD by Does not apply route as needed.      . prochlorperazine (COMPAZINE) 5 MG tablet Take 1 tablet (5 mg total) by mouth every 6 (six) hours as needed for nausea.  30 tablet  3  . rosuvastatin (CRESTOR) 20 MG tablet Take 1 tablet (20 mg total) by mouth at bedtime.  30 tablet  6  . traMADol (ULTRAM) 50 MG tablet Take 1 tablet (50 mg total) by mouth every 6 (six) hours as needed for pain.  60 tablet  5   No facility-administered medications prior to visit.    PAST MEDICAL HISTORY: Past Medical History  Diagnosis Date  . CAD (coronary artery disease) 02/2009    S/P anterior  MI  . Hyperlipemia   . Benign neoplasm of colon   . Unspecified mastoiditis   . Irritable bowel syndrome   . Hypertension   . Diverticulosis   . Colon polyp   . Ischemic cardiomyopathy     EF 25%  . Chronic systolic dysfunction of left ventricle   . Pulmonary embolism   . Chronic renal insufficiency   . Myocardial infarct   . Asthma   . Headache(784.0)   . Memory loss     PAST SURGICAL HISTORY: Past Surgical History  Procedure Laterality Date  . Total abdominal hysterectomy    . Bilateral salpingoophorectomy    . Inner ear surgery      left x 17  . Cervical spine surgery  08/08  . Lumbar disc surgery  08/09  . Cad( bare metal stent)   8/10  . Angioplasty  07/02/09, 04/01/10  . Cardiac defibrillator placement  08/05/11    Primary prevention SJM ICD implanted,  Analyze ST study patient    FAMILY HISTORY: Family History  Problem Relation Age of Onset  . Colon cancer Mother   . Colon cancer Brother   . Bladder Cancer Brother   . Breast cancer Cousin     SOCIAL HISTORY:  History   Social History  . Marital Status: Divorced    Spouse Name: N/A    Number of Children: 2  . Years of Education: 11   Occupational History  . Unemployed   .     Social History Main Topics  . Smoking status: Former Smoker -- 0.25 packs/day    Types: Cigarettes    Quit date: 04/27/2009  . Smokeless tobacco: Never Used  . Alcohol Use: No  . Drug Use: No  . Sexual Activity: Not on file   Other Topics Concern  . Not on file   Social History Narrative   Pt lives in [place] alone.  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   Patient has 11 th grade education.Right handed.   Caffeine- one cup daily           PHYSICAL EXAM   Filed Vitals:   08/23/13 1307  BP: 144/91  Pulse: 81  Height: 5\' 2"  (1.575 m)  Weight: 157 lb (71.215 kg)    Not recorded    Body mass index is 28.71 kg/(m^2).   Generalized: In no acute distress  Neck: Supple, no carotid bruits   Cardiac: Regular rate rhythm  Pulmonary: Clear to auscultation bilaterally  Musculoskeletal: No deformity  Neurological examination  Mentation: Alert oriented to time, place, history taking, and causual conversation  Cranial nerve II-XII: Pupils were equal round reactive to light extraocular movements were full, Visual field were full on confrontational test. Bilateral fundi were sharp.  Facial sensation and strength were normal. Uvula tongue midline.  head turning and shoulder shrug and were normal and symmetric.Tongue protrusion into cheek strength was normal.  Motor: normal tone, bulk and strength.  Sensory: Intact to fine touch, pinprick, preserved  vibratory sensation, and proprioception at toes.  Coordination: Normal finger to nose, heel-to-shin bilaterally there was no truncal ataxia  Gait: Rising up from seated position without assistance, normal stance, without trunk ataxia, moderate stride, good arm swing, smooth turning, able to perform tiptoe, and heel walking without difficulty.   Romberg signs: Negative  Deep tendon reflexes: Brachioradialis 2/2, biceps 2/2, triceps 2/2, patellar 2/2, Achilles 2/2, plantar responses were flexor bilaterally.   DIAGNOSTIC DATA (LABS, IMAGING, TESTING) - I reviewed patient records,  labs, notes, testing and imaging myself where available.  Lab Results  Component Value Date   WBC 10.8* 12/26/2012   HGB 13.2 12/26/2012   HCT 38.2 12/26/2012   MCV 95.0 12/26/2012   PLT 242 12/26/2012      Component Value Date/Time   NA 141 12/26/2012 0159   K 3.9 12/26/2012 0159   CL 103 12/26/2012 0159   CO2 27 12/26/2012 0159   GLUCOSE 95 12/26/2012 0159   BUN 19 12/26/2012 0159   CREATININE 1.06 12/26/2012 0159   CREATININE 1.16* 10/05/2012 1455   CALCIUM 9.7 12/26/2012 0159   PROT 7.4 01/27/2013 0857   ALBUMIN 4.0 01/27/2013 0857   AST 16 01/27/2013 0857   ALT 12 01/27/2013 0857   ALKPHOS 86 01/27/2013 0857   BILITOT 0.6 01/27/2013 0857   GFRNONAA 54* 12/26/2012 0159   GFRAA 62* 12/26/2012 0159   Lab Results  Component Value Date   CHOL 139 01/27/2013   HDL 44.90 01/27/2013   LDLCALC 61 01/27/2013   TRIG 164.0* 01/27/2013   CHOLHDL 3 01/27/2013   Lab Results  Component Value Date   HGBA1C  Value: 5.8 (NOTE) The ADA recommends the following therapeutic goal for glycemic control related to Hgb A1c measurement: Goal of therapy: <6.5 Hgb A1c  Reference: American Diabetes Association: Clinical Practice Recommendations 2010, Diabetes Care, 2010, 33: (Suppl  1). 03/07/2009   Lab Results  Component Value Date   [id number] 04/06/2010   Lab Results  Component Value Date   TSH 2.892 04/08/2010      ASSESSMENT AND PLAN  68 years old  right-handed Caucasian female, with past medical history of migraine headaches, also has past medical history of hypertension, hyperlipidemia, coronary artery disease, status post pacemaker placement  1. chronic migraine headaches, continue amitriptyline 50 mg every night as preventive medications , add on Topamax 50 mg twice a day 2. Fioricet,  Zofran as needed for abortive treatment   3. return to clinic in 6 months with Rhae Hammock, M.D. Ph.D.  [facility] [address], [place], [place] [address] [phone]

## 2013-08-23 NOTE — Telephone Encounter (Signed)
[facility] called the front desk stating they do not have the Liquid Zofran in stock.  They would like to know if a different medication can be called in instead.  I can gladly call them back and change it to tablets if that's okay with you, unless you prefer to prescribe a different drug.  Please advise.  Thank you.

## 2013-08-23 NOTE — Telephone Encounter (Signed)
[name] from [facility] called and stated that she doesn't have the liquid in stock and wanted to know if a different form of the drug would be ok to substitute.  Please call

## 2013-08-23 NOTE — Telephone Encounter (Signed)
It is okay to change to Zofran tablet

## 2013-08-23 NOTE — Addendum Note (Signed)
Addended by: Norva Pavlov C on: 08/23/2013 05:14 PM   Modules accepted: Orders

## 2013-08-23 NOTE — Telephone Encounter (Signed)
Rx has been updated and sent.  I called the pharmacy.  They are aware of the change, and do have tabs in stock.

## 2013-09-02 ENCOUNTER — Ambulatory Visit (INDEPENDENT_AMBULATORY_CARE_PROVIDER_SITE_OTHER): Payer: Medicare Other | Admitting: *Deleted

## 2013-09-02 DIAGNOSIS — I2589 Other forms of chronic ischemic heart disease: Secondary | ICD-10-CM

## 2013-09-02 DIAGNOSIS — I255 Ischemic cardiomyopathy: Secondary | ICD-10-CM

## 2013-09-02 LAB — MDC_IDC_ENUM_SESS_TYPE_INCLINIC
Brady Statistic RV Percent Paced: 0 %
HIGH POWER IMPEDANCE MEASURED VALUE: 77.625
Implantable Pulse Generator Serial Number: 1016523
Lead Channel Pacing Threshold Amplitude: 0.75 V
Lead Channel Sensing Intrinsic Amplitude: 12 mV
MDC IDC MSMT BATTERY REMAINING LONGEVITY: 87.6 mo
MDC IDC MSMT LEADCHNL RV IMPEDANCE VALUE: 450 Ohm
MDC IDC MSMT LEADCHNL RV PACING THRESHOLD AMPLITUDE: 0.75 V
MDC IDC MSMT LEADCHNL RV PACING THRESHOLD PULSEWIDTH: 0.5 ms
MDC IDC MSMT LEADCHNL RV PACING THRESHOLD PULSEWIDTH: 0.5 ms
MDC IDC SESS DTM: 20150206150335
MDC IDC SET LEADCHNL RV PACING AMPLITUDE: 2.5 V
MDC IDC SET LEADCHNL RV PACING PULSEWIDTH: 0.5 ms
MDC IDC SET LEADCHNL RV SENSING SENSITIVITY: 0.5 mV
MDC IDC SET ZONE DETECTION INTERVAL: 280 ms
Zone Setting Detection Interval: 340 ms

## 2013-09-02 NOTE — Progress Notes (Signed)
ICD check in clinic by [place]. Normal device function. Thresholds and sensing consistent with previous device measurements. Impedance trends stable over time. No evidence of any ventricular arrhythmias. Histogram distribution appropriate for patient and level of activity. No changes made this session. Device programmed at appropriate safety margins. Device programmed to optimize intrinsic conduction. Estimated longevity 7.3-7.78yrs. Pt enrolled in remote follow-up.   Merlin Analyze 12/05/13 & ROV w/ Dr. Rayann Heman in 52mo.

## 2013-09-05 ENCOUNTER — Other Ambulatory Visit: Payer: Self-pay | Admitting: Cardiology

## 2013-09-16 ENCOUNTER — Encounter: Payer: Self-pay | Admitting: Internal Medicine

## 2013-09-27 ENCOUNTER — Other Ambulatory Visit: Payer: Self-pay | Admitting: Neurosurgery

## 2013-09-27 DIAGNOSIS — M4120 Other idiopathic scoliosis, site unspecified: Secondary | ICD-10-CM

## 2013-09-29 ENCOUNTER — Ambulatory Visit
Admission: RE | Admit: 2013-09-29 | Discharge: 2013-09-29 | Disposition: A | Discharge status: Home / Self Care | Payer: Medicare Other | Source: Ambulatory Visit | Attending: Neurosurgery | Admitting: Neurosurgery

## 2013-09-29 DIAGNOSIS — M4120 Other idiopathic scoliosis, site unspecified: Secondary | ICD-10-CM

## 2013-10-06 ENCOUNTER — Other Ambulatory Visit: Payer: Self-pay | Admitting: Orthopedic Surgery

## 2013-10-06 DIAGNOSIS — M25561 Pain in right knee: Secondary | ICD-10-CM

## 2013-10-06 DIAGNOSIS — R531 Weakness: Secondary | ICD-10-CM

## 2013-10-07 ENCOUNTER — Other Ambulatory Visit (HOSPITAL_COMMUNITY): Payer: Self-pay | Admitting: Neurosurgery

## 2013-10-07 DIAGNOSIS — M545 Low back pain, unspecified: Secondary | ICD-10-CM

## 2013-10-11 ENCOUNTER — Ambulatory Visit
Admission: RE | Admit: 2013-10-11 | Discharge: 2013-10-11 | Disposition: A | Discharge status: Home / Self Care | Payer: Medicare Other | Source: Ambulatory Visit | Attending: Orthopedic Surgery | Admitting: Orthopedic Surgery

## 2013-10-11 DIAGNOSIS — M25561 Pain in right knee: Secondary | ICD-10-CM

## 2013-10-11 DIAGNOSIS — R531 Weakness: Secondary | ICD-10-CM

## 2013-10-11 MED ORDER — IOHEXOL 180 MG/ML  SOLN
30.0000 mL | Freq: Once | INTRAMUSCULAR | Status: AC | PRN
  Administered 2013-10-11: 30 mL via INTRA_ARTICULAR

## 2013-10-14 ENCOUNTER — Encounter (HOSPITAL_COMMUNITY)
Admission: RE | Admit: 2013-10-14 | Discharge: 2013-10-14 | Disposition: A | Discharge status: Home / Self Care | Payer: Medicare Other | Source: Ambulatory Visit | Attending: Neurosurgery | Admitting: Neurosurgery

## 2013-10-14 DIAGNOSIS — M545 Low back pain, unspecified: Secondary | ICD-10-CM | POA: Insufficient documentation

## 2013-10-14 MED ORDER — TECHNETIUM TC 99M MEDRONATE IV KIT
25.0000 | PACK | Freq: Once | INTRAVENOUS | Status: AC | PRN
  Administered 2013-10-14: 25 via INTRAVENOUS

## 2013-10-24 ENCOUNTER — Ambulatory Visit: Payer: Medicare Other | Admitting: Nurse Practitioner

## 2013-11-01 ENCOUNTER — Other Ambulatory Visit: Payer: Self-pay

## 2013-11-01 DIAGNOSIS — I712 Thoracic aortic aneurysm, without rupture, unspecified: Secondary | ICD-10-CM

## 2013-11-30 ENCOUNTER — Other Ambulatory Visit: Payer: Self-pay | Admitting: Cardiovascular Disease

## 2013-12-02 ENCOUNTER — Encounter: Payer: Medicare Other | Admitting: *Deleted

## 2013-12-02 ENCOUNTER — Telehealth: Payer: Self-pay | Admitting: Cardiovascular Disease

## 2013-12-02 NOTE — Telephone Encounter (Signed)
Follow up    Returned nurses call---Left a different number for you to call

## 2013-12-02 NOTE — Telephone Encounter (Signed)
Left message on machine for pt to contact the office.   

## 2013-12-02 NOTE — Telephone Encounter (Signed)
New message     Talk to a nurse about her medication--PCP changed her dosage

## 2013-12-02 NOTE — Telephone Encounter (Signed)
I spoke with the pt and made her aware of appointment scheduled on 12/14/13 at 2:40 with Richardson Dopp PA-C.  I once again advised the pt of the importance of bringing her medications to this appointment.  She verbalized understanding.

## 2013-12-02 NOTE — Telephone Encounter (Signed)
I spoke with the pt and she is very confused about her bisoprolol dosage.  The pt said that Dr [name] had her taking 10mg  one and one-half tablet daily.  On her most recent appointment in December the medication list has 5mg  one tablet daily but her pulse was 104.  The pt said she had been told in the past to cut the 10mg  tablet in half. The pt states she has had difficulty cutting the tablet in half so she is now taking 10mg  daily. The pt said she is going to be due for a refill on this medication but she is unsure what dosage she should be taking.  I made the pt aware that the only way to evaluate this would be to check her BP and pulse for a few readings and then review readings.  The pt does not have a home BP cuff and she did not understand my instructions about going to a local pharmacy. I felt like the pt needs to be seen for an appointment to assess her BP and pulse before making further medication recommendations.  The pt agreed with this plan. I instructed the pt that she has to bring all medications into her appointment so that we can assist her with medication questions.

## 2013-12-05 ENCOUNTER — Ambulatory Visit (INDEPENDENT_AMBULATORY_CARE_PROVIDER_SITE_OTHER): Payer: Medicare Other | Admitting: *Deleted

## 2013-12-05 ENCOUNTER — Encounter: Payer: Self-pay | Admitting: Internal Medicine

## 2013-12-05 DIAGNOSIS — I2589 Other forms of chronic ischemic heart disease: Secondary | ICD-10-CM

## 2013-12-05 LAB — MDC_IDC_ENUM_SESS_TYPE_REMOTE
Battery Remaining Percentage: 77 %
Brady Statistic RV Percent Paced: 1 % — CL
HighPow Impedance: 79 Ohm
Implantable Pulse Generator Serial Number: 1016523
Lead Channel Sensing Intrinsic Amplitude: 12 mV
Lead Channel Setting Pacing Amplitude: 2.5 V
Lead Channel Setting Pacing Pulse Width: 0.5 ms
Lead Channel Setting Sensing Sensitivity: 0.5 mV
MDC IDC MSMT LEADCHNL RV IMPEDANCE VALUE: 460 Ohm
MDC IDC SET ZONE DETECTION INTERVAL: 280 ms
MDC IDC SET ZONE DETECTION INTERVAL: 340 ms

## 2013-12-05 NOTE — Progress Notes (Signed)
Remote ICD transmission.   

## 2013-12-12 ENCOUNTER — Other Ambulatory Visit: Payer: Self-pay | Admitting: Cardiothoracic Surgery

## 2013-12-12 LAB — BUN: BUN: 38 mg/dL — ABNORMAL HIGH (ref 6–23)

## 2013-12-12 LAB — CREATININE, SERUM: Creat: 1.68 mg/dL — ABNORMAL HIGH (ref 0.50–1.10)

## 2013-12-14 ENCOUNTER — Ambulatory Visit (INDEPENDENT_AMBULATORY_CARE_PROVIDER_SITE_OTHER): Payer: Medicare Other | Admitting: Physician Assistant

## 2013-12-14 ENCOUNTER — Ambulatory Visit (INDEPENDENT_AMBULATORY_CARE_PROVIDER_SITE_OTHER): Payer: Medicare Other | Admitting: Cardiothoracic Surgery

## 2013-12-14 ENCOUNTER — Ambulatory Visit
Admission: RE | Admit: 2013-12-14 | Discharge: 2013-12-14 | Disposition: A | Discharge status: Home / Self Care | Payer: Medicare Other | Source: Ambulatory Visit | Attending: Cardiothoracic Surgery | Admitting: Cardiothoracic Surgery

## 2013-12-14 ENCOUNTER — Encounter: Payer: Self-pay | Admitting: Physician Assistant

## 2013-12-14 VITALS — BP 110/80 | HR 89 | Ht 62.0 in | Wt 153.0 lb

## 2013-12-14 VITALS — BP 135/88 | HR 77 | Resp 16 | Ht 62.0 in | Wt 156.0 lb

## 2013-12-14 DIAGNOSIS — I7122 Aneurysm of the aortic arch, without rupture: Secondary | ICD-10-CM

## 2013-12-14 DIAGNOSIS — I255 Ischemic cardiomyopathy: Secondary | ICD-10-CM

## 2013-12-14 DIAGNOSIS — I10 Essential (primary) hypertension: Secondary | ICD-10-CM

## 2013-12-14 DIAGNOSIS — I712 Thoracic aortic aneurysm, without rupture, unspecified: Secondary | ICD-10-CM

## 2013-12-14 DIAGNOSIS — E7800 Pure hypercholesterolemia, unspecified: Secondary | ICD-10-CM

## 2013-12-14 DIAGNOSIS — I2510 Atherosclerotic heart disease of native coronary artery without angina pectoris: Secondary | ICD-10-CM

## 2013-12-14 DIAGNOSIS — I2589 Other forms of chronic ischemic heart disease: Secondary | ICD-10-CM

## 2013-12-14 DIAGNOSIS — I5022 Chronic systolic (congestive) heart failure: Secondary | ICD-10-CM

## 2013-12-14 MED ORDER — BISOPROLOL FUMARATE 5 MG PO TABS: 5.0000 mg | ORAL_TABLET | Freq: Every day | ORAL | Status: DC

## 2013-12-14 MED ORDER — IOHEXOL 350 MG/ML SOLN
50.0000 mL | Freq: Once | INTRAVENOUS | Status: AC | PRN
Start: 2013-12-14 — End: 2013-12-14
  Administered 2013-12-14: 50 mL via INTRAVENOUS

## 2013-12-14 NOTE — Progress Notes (Signed)
Cardiology Office Note   Date:  12/14/2013   ID:  [name], DOB May 16, 1946, MRN [id number]  PCP:  Leamon Arnt, MD  Cardiologist:  Dr.  Bing Quarry => Dr. [name]   Electrophysiologist:  Dr. Thompson Grayer    History of Present Illness: [name] is a 68 y.o. female with a hx of CAD s/p anterior STEMI rx with BMS to prox LAD in 02/2009, PTCA in 2010 and 2011 2/2 ISR, prior pulmonary embolism, HTN, HL, ischemic cardiomyopathy, systolic CHF, s/p AICD.  She has a hx of GI bleeding on DAP therapy and is on ASA only.  She was last seen by Dr. [name] in 06/2013.  Myoview was arranged due to symptoms of chest pain. This showed large anterior scar but no ischemia.  Med Rx was continued.  She follows with Dr. Prescott Gum for prox descending thoracic aortic aneurysm.  She was seen today and recent CT showed stable findings.  She is to follow up in 1 year.    She called in recently with concerns about her bisoprolol. Previously, she was apparently taking 15 mg daily. She has been taking 5 mg daily for the past year. There was some confusion about what medication to refill. She has been doing well. She denies chest discomfort, syncope, orthopnea, PND or edema. She denies significant dyspnea with exertion.  Studies:  - LHC (06/2010):  prox LAD stent ok with 30-50% ISR, prox to mid RCA 30-40%.  - Echo (04/2011):  Septal apical and ant HK, cannot r/o mural apical clot, EF 25%, mild LAE.  - Nuclear (07/2013):  Large scar involving apex and apical ant and apical septal segments, no ischemia, EF 25%; High Risk    Recent Labs: 12/26/2012: Hemoglobin 13.2; Potassium 3.9  01/27/2013: ALT 12; HDL Cholesterol by NMR 44.90; LDL (calc) 61  12/12/2013: Creatinine 1.68*   Wt Readings from Last 3 Encounters:  12/14/13 153 lb (69.4 kg)  12/14/13 156 lb (70.761 kg)  08/23/13 157 lb (71.215 kg)     Past Medical History  Diagnosis Date  . CAD (coronary artery disease) 02/2009    S/P  anterior MI  . Hyperlipemia   . Benign neoplasm of colon   . Unspecified mastoiditis   . Irritable bowel syndrome   . Hypertension   . Diverticulosis   . Colon polyp   . Ischemic cardiomyopathy     EF 25%  . Chronic systolic dysfunction of left ventricle   . Pulmonary embolism   . Chronic renal insufficiency   . Myocardial infarct   . Asthma   . Headache(784.0)   . Memory loss     Current Outpatient Prescriptions  Medication Sig Dispense Refill  . albuterol (PROAIR HFA) 108 (90 BASE) MCG/ACT inhaler Inhale 2 puffs into the lungs every 6 (six) hours as needed. For shortness of breath      . amitriptyline (ELAVIL) 50 MG tablet Take 1 tablet (50 mg total) by mouth at bedtime. Take one every night  30 tablet  2  . aspirin 81 MG tablet Take 81 mg by mouth every morning.       . bisoprolol (ZEBETA) 5 MG tablet Take 5 mg by mouth daily.       . CRESTOR 20 MG tablet TAKE 1 TABLET BY MOUTH AT BEDTIME.  30 tablet  6  . esomeprazole (NEXIUM) 40 MG capsule Take 1 capsule (40 mg total) by mouth daily.      Marland Kitchen lisinopril (PRINIVIL,ZESTRIL) 10  MG tablet TAKE 1 TABLET BY MOUTH EVERY MORNING.  30 tablet  6  . Polyethylene Glycol POWD by Does not apply route as needed.      . topiramate (TOPAMAX) 50 MG tablet Take 1 tablet (50 mg total) by mouth 2 (two) times daily.  60 tablet  12   No current facility-administered medications for this visit.    Allergies:   Sulfonamide derivatives   Social History:  The patient  reports that she quit smoking about 4 years ago. Her smoking use included Cigarettes. She smoked 0.25 packs per day. She has never used smokeless tobacco. She reports that she does not drink alcohol or use illicit drugs.   Family History:  The patient's family history includes Bladder Cancer in her brother; Breast cancer in her cousin; Colon cancer in her brother and mother.   ROS:  Please see the history of present illness.      All other systems reviewed and negative.   PHYSICAL  EXAM: VS:  BP 110/80  Pulse 89  Ht 5\' 2"  (1.575 m)  Wt 153 lb (69.4 kg)  BMI 27.98 kg/m2 Well nourished, well developed, in no acute distress HEENT: normal Neck: no JVD Cardiac:  normal S1, S2; RRR; no murmur Lungs:  clear to auscultation bilaterally, no wheezing, rhonchi or rales Abd: soft, nontender, no hepatomegaly Ext: no edema Skin: warm and dry Neuro:  CNs 2-12 intact, no focal abnormalities noted  EKG:  NSR, HR 89, LAD, nonspecific ST-T wave changes, no change from prior tracing     ASSESSMENT AND PLAN:  1. Cardiomyopathy, ischemic: She should remain on combination of beta blocker and ACE inhibitor. At this point in time, I have recommended that she continue bisoprolol 5 mg daily. If her heart rate and blood pressure run higher, I would suggest that she increase this to 7.5 mg daily. 2. Chronic systolic heart failure: Volume stable. 3. CAD (coronary artery disease): No angina. Recent Myoview without significant ischemia. Continue Aspirin, statin, beta blocker. 4. Essential hypertension, benign: Controlled. 5. Pure hypercholesterolemia: Continue statin. 6. Disposition: Follow up with Dr. Burt Knack in December 2015 as planned.   Signed, Versie Starks, MHS 12/14/2013 3:09 PM    [facility] [place], [place], [place]  [address] Phone: [phone]; Fax: [phone]

## 2013-12-14 NOTE — Progress Notes (Signed)
PCP is ANDY,CAMILLE L, MD Referring Provider is Hillary Bow, MD  Chief Complaint  Patient presents with  . Follow-up     pseudoaneurysm of the aortic arch with CTA CHEST    HPI: Patient returns for followup of 3.4 cm aneurysm of the proximal descending thoracic aorta first noted 2013. There is mural thrombus present as well as calcification. There is no dissection or intimal flap. This is asymptomatic. CTA of the thoracic aorta is performed today and compared to the study last year showing no change. The aneurysm with mural thrombus measures 3.4 cm at the proximal descending thoracic aorta.  The patient does not smoke. She is on aspirin, Crestor, and lisinopril. Her blood pressure remains under good control by her primary care physician Dr. Rachell Cipro  The patient has history of an anterior MI with EF 25% followed by Dr. Burt Knack.  Medicine Myoview scan earlier this year demonstrates anterior scar without evidence of reversible ischemia.   Past Medical History  Diagnosis Date  . CAD (coronary artery disease) 02/2009    S/P anterior MI  . Hyperlipemia   . Benign neoplasm of colon   . Unspecified mastoiditis   . Irritable bowel syndrome   . Hypertension   . Diverticulosis   . Colon polyp   . Ischemic cardiomyopathy     EF 25%  . Chronic systolic dysfunction of left ventricle   . Pulmonary embolism   . Chronic renal insufficiency   . Myocardial infarct   . Asthma   . Headache(784.0)   . Memory loss     Past Surgical History  Procedure Laterality Date  . Total abdominal hysterectomy    . Bilateral salpingoophorectomy    . Inner ear surgery      left x 17  . Cervical spine surgery  08/08  . Lumbar disc surgery  08/09  . Cad( bare metal stent)  8/10  . Angioplasty  07/02/09, 04/01/10  . Cardiac defibrillator placement  08/05/11    Primary prevention SJM ICD implanted,  Analyze ST study patient    Family History  Problem Relation Age of Onset  . Colon cancer  Mother   . Colon cancer Brother   . Bladder Cancer Brother   . Breast cancer Cousin     Social History History  Substance Use Topics  . Smoking status: Former Smoker -- 0.25 packs/day    Types: Cigarettes    Quit date: 04/27/2009  . Smokeless tobacco: Never Used  . Alcohol Use: No    Current Outpatient Prescriptions  Medication Sig Dispense Refill  . albuterol (PROAIR HFA) 108 (90 BASE) MCG/ACT inhaler Inhale 2 puffs into the lungs every 6 (six) hours as needed. For shortness of breath      . alendronate (FOSAMAX) 70 MG tablet       . amitriptyline (ELAVIL) 50 MG tablet Take 1 tablet (50 mg total) by mouth at bedtime. Take one every night  30 tablet  2  . aspirin 81 MG tablet Take 81 mg by mouth every morning.       . bisoprolol (ZEBETA) 5 MG tablet Take 5 mg by mouth daily.       . CRESTOR 20 MG tablet TAKE 1 TABLET BY MOUTH AT BEDTIME.  30 tablet  6  . esomeprazole (NEXIUM) 40 MG capsule Take 1 capsule (40 mg total) by mouth daily.      Marland Kitchen glycopyrrolate (ROBINUL) 2 MG tablet Take 1 tablet (2 mg total) by mouth 2 (two)  times daily.  60 tablet  1  . HYDROcodone-acetaminophen (NORCO/VICODIN) 5-325 MG per tablet As directed      . lisinopril (PRINIVIL,ZESTRIL) 10 MG tablet TAKE 1 TABLET BY MOUTH EVERY MORNING.  30 tablet  6  . ondansetron (ZOFRAN) 4 MG tablet Take 1 tablet (4 mg total) by mouth as needed for nausea or vomiting (Max 3 in 24 hours).  30 tablet  3  . Polyethylene Glycol POWD by Does not apply route as needed.      . prochlorperazine (COMPAZINE) 5 MG tablet Take 1 tablet (5 mg total) by mouth every 6 (six) hours as needed for nausea.  30 tablet  3  . topiramate (TOPAMAX) 50 MG tablet Take 1 tablet (50 mg total) by mouth 2 (two) times daily.  60 tablet  12  . traMADol (ULTRAM) 50 MG tablet Take 1 tablet (50 mg total) by mouth every 6 (six) hours as needed for pain.  60 tablet  5   No current facility-administered medications for this visit.    Allergies  Allergen  Reactions  . Sulfonamide Derivatives Other (See Comments)    UNSURE    Review of Systems alert and comfortable No history of upper back or chest pain, it is difficult for patient to walk daily because of her chronic low back pain. She's try not to gain weight.  BP 135/88  Pulse 77  Resp 16  Ht 5\' 2"  (1.575 m)  Wt 156 lb (70.761 kg)  BMI 28.53 kg/m2  SpO2 95% Physical Exam Middle-aged female no distress HEENT normocephalic carotid pulses normal no JVD Lungs clear Heart rhythm regular murmur or gallop Abdomen soft without pulsatile mass Extremities without edema  Diagnostic Tests: CTA of the thoracic aorta shows stable small atherosclerotic aneurysm of the descending thoracic aorta with mural thrombus measuring 3.4 cm. Continue to follow annual scans  Plan-importance of blood pressure control, weight control, regular exercise and lipid management to prevent enlargement of her thoracic aortic aneurysm reviewed with patient  Return for CTA in one year Report any significant pain interscapular or anterior chest pain immediately

## 2013-12-14 NOTE — Patient Instructions (Signed)
Your physician recommends that you schedule a follow-up appointment in:  December with Dr. [name].

## 2013-12-15 ENCOUNTER — Encounter: Payer: Self-pay | Admitting: Cardiology

## 2014-01-18 ENCOUNTER — Encounter: Payer: Self-pay | Admitting: Cardiology

## 2014-02-17 ENCOUNTER — Ambulatory Visit (INDEPENDENT_AMBULATORY_CARE_PROVIDER_SITE_OTHER): Payer: Medicare Other | Admitting: Internal Medicine

## 2014-02-17 ENCOUNTER — Encounter: Payer: Self-pay | Admitting: Internal Medicine

## 2014-02-17 VITALS — BP 116/77 | HR 75 | Ht 62.0 in | Wt 153.0 lb

## 2014-02-17 DIAGNOSIS — I5022 Chronic systolic (congestive) heart failure: Secondary | ICD-10-CM

## 2014-02-17 DIAGNOSIS — I2589 Other forms of chronic ischemic heart disease: Secondary | ICD-10-CM

## 2014-02-17 DIAGNOSIS — I428 Other cardiomyopathies: Secondary | ICD-10-CM

## 2014-02-17 DIAGNOSIS — R059 Cough, unspecified: Secondary | ICD-10-CM

## 2014-02-17 DIAGNOSIS — R05 Cough: Secondary | ICD-10-CM

## 2014-02-17 DIAGNOSIS — I2510 Atherosclerotic heart disease of native coronary artery without angina pectoris: Secondary | ICD-10-CM

## 2014-02-17 DIAGNOSIS — F17200 Nicotine dependence, unspecified, uncomplicated: Secondary | ICD-10-CM

## 2014-02-17 DIAGNOSIS — I10 Essential (primary) hypertension: Secondary | ICD-10-CM

## 2014-02-17 DIAGNOSIS — I255 Ischemic cardiomyopathy: Secondary | ICD-10-CM

## 2014-02-17 LAB — MDC_IDC_ENUM_SESS_TYPE_INCLINIC
Brady Statistic RV Percent Paced: 0 %
HIGH POWER IMPEDANCE MEASURED VALUE: 73.125
HighPow Impedance: 73 Ohm
Implantable Pulse Generator Serial Number: 1016523
Lead Channel Pacing Threshold Amplitude: 0.75 V
Lead Channel Pacing Threshold Amplitude: 0.75 V
Lead Channel Pacing Threshold Pulse Width: 0.5 ms
Lead Channel Sensing Intrinsic Amplitude: 12 mV
Lead Channel Setting Pacing Amplitude: 2.5 V
Lead Channel Setting Pacing Pulse Width: 0.5 ms
Lead Channel Setting Sensing Sensitivity: 0.5 mV
MDC IDC MSMT BATTERY REMAINING LONGEVITY: 84 mo
MDC IDC MSMT LEADCHNL RV IMPEDANCE VALUE: 487.5 Ohm
MDC IDC MSMT LEADCHNL RV PACING THRESHOLD PULSEWIDTH: 0.5 ms
MDC IDC SESS DTM: 20150724133036
MDC IDC SET ZONE DETECTION INTERVAL: 340 ms
Zone Setting Detection Interval: 280 ms

## 2014-02-17 NOTE — Patient Instructions (Signed)
Your physician recommends that you schedule a follow-up appointment in: [place] in 6 weeks.  Your physician wants you to follow-up in: 1 year with Dr Vallery Ridge will receive a reminder letter in the mail two months in advance. If you don't receive a letter, please call our office to schedule the follow-up appointment.  Your physician has recommended you make the following change in your medication: Stop Lisinopril

## 2014-02-19 ENCOUNTER — Encounter: Payer: Self-pay | Admitting: Internal Medicine

## 2014-02-19 NOTE — Progress Notes (Signed)
PCP: Leamon Arnt, MD Primary Cardiologist:  Dr Burt Knack (previously Dr [name])  [name] is a 68 y.o. female who presents today for routine electrophysiology followup.  Since last being seen in our clinic, the patient reports doing well.  Her primary concern today is with a dry productive cough.  This has been present for more than 6 months.  She has occasional postural dizziness.   Today, she denies symptoms of palpitations, exertional chest pain, shortness of breath,  lower extremity edema,  presyncope, syncope, or ICD shocks.  The patient is otherwise without complaint today.   Past Medical History  Diagnosis Date  . CAD (coronary artery disease) 02/2009    S/P anterior MI  . Hyperlipemia   . Benign neoplasm of colon   . Unspecified mastoiditis   . Irritable bowel syndrome   . Hypertension   . Diverticulosis   . Colon polyp   . Ischemic cardiomyopathy     EF 25%  . Chronic systolic dysfunction of left ventricle   . Pulmonary embolism   . Chronic renal insufficiency   . Myocardial infarct   . Asthma   . Headache(784.0)   . Memory loss    Past Surgical History  Procedure Laterality Date  . Total abdominal hysterectomy    . Bilateral salpingoophorectomy    . Inner ear surgery      left x 17  . Cervical spine surgery  08/08  . Lumbar disc surgery  08/09  . Cad( bare metal stent)  8/10  . Angioplasty  07/02/09, 04/01/10  . Cardiac defibrillator placement  08/05/11    Primary prevention SJM ICD implanted,  Analyze ST study patient    Current Outpatient Prescriptions  Medication Sig Dispense Refill  . albuterol (PROAIR HFA) 108 (90 BASE) MCG/ACT inhaler Inhale 2 puffs into the lungs every 6 (six) hours as needed. For shortness of breath      . amitriptyline (ELAVIL) 50 MG tablet Take 50 mg by mouth at bedtime.      Marland Kitchen aspirin 81 MG tablet Take 81 mg by mouth every morning.       . bisoprolol (ZEBETA) 5 MG tablet Take 1 tablet (5 mg total) by mouth daily.  30 tablet  11   . CRESTOR 20 MG tablet TAKE 1 TABLET BY MOUTH AT BEDTIME.  30 tablet  6  . esomeprazole (NEXIUM) 40 MG capsule Take 1 capsule (40 mg total) by mouth daily.      . Polyethylene Glycol POWD Take 1 packet by mouth as needed.       . topiramate (TOPAMAX) 50 MG tablet Take 1 tablet (50 mg total) by mouth 2 (two) times daily.  60 tablet  12   No current facility-administered medications for this visit.    Physical Exam: Filed Vitals:   02/17/14 1229  BP: 116/77  Pulse: 75  Height: 5\' 2"  (1.575 m)  Weight: 153 lb (69.4 kg)    GEN- The patient is well appearing, alert and oriented x 3 today.   Head- normocephalic, atraumatic Eyes-  Sclera clear, conjunctiva pink Ears- hearing intact Oropharynx- clear Lungs- Clear to ausculation bilaterally, normal work of breathing Chest- ICD pocket is well healed Heart- Regular rate and rhythm, no murmurs, rubs or gallops, PMI not laterally displaced GI- soft, NT, ND, + BS Extremities- no clubbing, cyanosis, or edema  ICD interrogation- reviewed in detail today,  See PACEART report  Assessment and Plan:  Chronic systolic heart failure  Normal ICD function  See Claudia Desanctis Art report  No changes today  Her cough may be due to lisinopril.  I will stop lisinopril today.  She will return to see Cecille Rubin in 6 weeks.  If her cough is resolved off of lisinopril then she should start an ARB.  If her cough is not affected by lisinopril then I think that lisinopril could be restated.  Cardiomyopathy, ischemic  No ischemic symptoms  No chf on exam   Follow-up with Dr Vangie Bicker I will see again in 1 year

## 2014-02-24 ENCOUNTER — Encounter: Payer: Self-pay | Admitting: Internal Medicine

## 2014-03-14 ENCOUNTER — Encounter: Payer: Self-pay | Admitting: Gastroenterology

## 2014-04-05 ENCOUNTER — Encounter: Payer: Self-pay | Admitting: Nurse Practitioner

## 2014-04-05 ENCOUNTER — Ambulatory Visit (INDEPENDENT_AMBULATORY_CARE_PROVIDER_SITE_OTHER): Payer: Medicare Other | Admitting: Nurse Practitioner

## 2014-04-05 VITALS — BP 130/80 | HR 70 | Ht 62.0 in | Wt 152.8 lb

## 2014-04-05 DIAGNOSIS — I2510 Atherosclerotic heart disease of native coronary artery without angina pectoris: Secondary | ICD-10-CM

## 2014-04-05 DIAGNOSIS — R059 Cough, unspecified: Secondary | ICD-10-CM

## 2014-04-05 DIAGNOSIS — I2589 Other forms of chronic ischemic heart disease: Secondary | ICD-10-CM

## 2014-04-05 DIAGNOSIS — I5022 Chronic systolic (congestive) heart failure: Secondary | ICD-10-CM

## 2014-04-05 DIAGNOSIS — I255 Ischemic cardiomyopathy: Secondary | ICD-10-CM

## 2014-04-05 DIAGNOSIS — R05 Cough: Secondary | ICD-10-CM

## 2014-04-05 LAB — BASIC METABOLIC PANEL
BUN: 20 mg/dL (ref 6–23)
CO2: 21 mEq/L (ref 19–32)
Calcium: 8.8 mg/dL (ref 8.4–10.5)
Chloride: 110 mEq/L (ref 96–112)
Creatinine, Ser: 1.4 mg/dL — ABNORMAL HIGH (ref 0.4–1.2)
GFR: 41.26 mL/min — ABNORMAL LOW (ref >60.00)
Glucose, Bld: 80 mg/dL (ref 70–99)
Potassium: 3.7 mEq/L (ref 3.5–5.1)
Sodium: 140 mEq/L (ref 135–145)

## 2014-04-05 NOTE — Patient Instructions (Signed)
We need to check lab today  Based on what your lab looks like - we will either start Losartan or Isordil/Hydralazine - we will call you tomorrow and let you know  Stay on all your other medicines  I think you are doing well  Call the [place] office at [phone] if you have any questions, problems or concerns.

## 2014-04-05 NOTE — Progress Notes (Signed)
[name] Date of Birth: 1946/04/19 Medical Record [id number]  History of Present Illness: [name] is seen back today for a follow up visit. This is a 6 week check. Seen for Dr. Rayann Heman and Dr. Burt Knack. She is a 68 year old female with a hx of CAD s/p anterior STEMI rx with BMS to prox LAD in 02/2009, PTCA in 2010 and 2011 2/2 ISR, prior pulmonary embolism, HTN, HL, ischemic cardiomyopathy with an EF of 123456, systolic CHF, s/p AICD. She has a hx of GI bleeding on DAP therapy and is on ASA only. She was last seen by Dr. [name] in 06/2013. Myoview was arranged due to symptoms of chest pain. This showed large anterior scar but no ischemia. Med Rx was continued. She follows with Dr. Prescott Gum for prox descending thoracic aortic aneurysm.    Seen here at the end of July - device check ok. Reported a cough - was on ACE - this was stopped.  Comes in today. Here alone. Doing ok. Her cough has resolved. She is quite happy about that. She wishes to go on some type of alternative for her heart.  She feels fine. No chest pain. Not short of breath. Not dizzy or lightheaded. Tolerating her medicines. Lab from May does show elevated BUN and creatinine.   Current Outpatient Prescriptions  Medication Sig Dispense Refill  . albuterol (PROAIR HFA) 108 (90 BASE) MCG/ACT inhaler Inhale 2 puffs into the lungs every 6 (six) hours as needed. For shortness of breath      . amitriptyline (ELAVIL) 50 MG tablet Take 50 mg by mouth at bedtime.      Marland Kitchen aspirin 81 MG tablet Take 81 mg by mouth every morning.       . bisoprolol (ZEBETA) 5 MG tablet Take 1 tablet (5 mg total) by mouth daily.  30 tablet  11  . CRESTOR 20 MG tablet TAKE 1 TABLET BY MOUTH AT BEDTIME.  30 tablet  6  . esomeprazole (NEXIUM) 40 MG capsule Take 1 capsule (40 mg total) by mouth daily.      . Polyethylene Glycol POWD Take 1 packet by mouth as needed.       . topiramate (TOPAMAX) 50 MG tablet Take 1 tablet (50 mg total) by mouth 2 (two)  times daily.  60 tablet  12   No current facility-administered medications for this visit.    Allergies  Allergen Reactions  . Lisinopril     cough  . Sulfa Antibiotics   . Sulfonamide Derivatives Other (See Comments)    UNSURE    Past Medical History  Diagnosis Date  . CAD (coronary artery disease) 02/2009    S/P anterior MI  . Hyperlipemia   . Benign neoplasm of colon   . Unspecified mastoiditis   . Irritable bowel syndrome   . Hypertension   . Diverticulosis   . Colon polyp   . Ischemic cardiomyopathy     EF 25%  . Chronic systolic dysfunction of left ventricle   . Pulmonary embolism   . Chronic renal insufficiency   . Myocardial infarct   . Asthma   . Headache(784.0)   . Memory loss     Past Surgical History  Procedure Laterality Date  . Total abdominal hysterectomy    . Bilateral salpingoophorectomy    . Inner ear surgery      left x 17  . Cervical spine surgery  08/08  . Lumbar disc surgery  08/09  .  Cad( bare metal stent)  8/10  . Angioplasty  07/02/09, 04/01/10  . Cardiac defibrillator placement  08/05/11    Primary prevention SJM ICD implanted,  Analyze ST study patient    History  Smoking status  . Former Smoker -- 0.25 packs/day  . Types: Cigarettes  . Quit date: 04/27/2009  Smokeless tobacco  . Never Used    History  Alcohol Use No    Family History  Problem Relation Age of Onset  . Colon cancer Mother   . Colon cancer Brother   . Bladder Cancer Brother   . Breast cancer Cousin     Review of Systems: The review of systems is per the HPI.  All other systems were reviewed and are negative.  Physical Exam: BP 130/80  Pulse 70  Ht 5\' 2"  (1.575 m)  Wt 152 lb 12.8 oz (69.31 kg)  BMI 27.94 kg/m2  SpO2 97% Patient is very pleasant and in no acute distress. Skin is warm and dry. Color is normal.  HEENT is unremarkable. Normocephalic/atraumatic. PERRL. Sclera are nonicteric. Neck is supple. No masses. No JVD. Lungs are clear. Cardiac exam  shows a regular rate and rhythm. Abdomen is soft. Extremities are without edema. Gait and ROM are intact. No gross neurologic deficits noted.  Wt Readings from Last 3 Encounters:  04/05/14 152 lb 12.8 oz (69.31 kg)  02/17/14 153 lb (69.4 kg)  12/14/13 153 lb (69.4 kg)    LABORATORY DATA/PROCEDURES:  Lab Results  Component Value Date   WBC 10.8* 12/26/2012   HGB 13.2 12/26/2012   HCT 38.2 12/26/2012   PLT 242 12/26/2012   GLUCOSE 95 12/26/2012   CHOL 139 01/27/2013   TRIG 164.0* 01/27/2013   HDL 44.90 01/27/2013   LDLCALC 61 01/27/2013   ALT 12 01/27/2013   AST 16 01/27/2013   NA 141 12/26/2012   K 3.9 12/26/2012   CL 103 12/26/2012   CREATININE 1.68* 12/12/2013   BUN 38* 12/12/2013   CO2 27 12/26/2012   TSH 2.892 04/08/2010   INR 0.99 01/08/2012   HGBA1C  Value: 5.8 (NOTE) The ADA recommends the following therapeutic goal for glycemic control related to Hgb A1c measurement: Goal of therapy: <6.5 Hgb A1c  Reference: American Diabetes Association: Clinical Practice Recommendations 2010, Diabetes Care, 2010, 33: (Suppl  1). 03/07/2009    BNP (last 3 results) No results found for this basename: PROBNP,  in the last 8760 hours   Assessment / Plan: 1. Ischemic CM - EF of 25% - has ICD in place. Looks compensated. Would like to get her back on ARB since she is now felt to be ACE allergic/intolerant. Does have CKD - will recheck BMET today and then decide as to what to start. May need to consider nitrate/hydralazine therapy instead.  2. CAD with prior anterior MI - no active symptoms.  3. Cough  4. Chronic systolic HF - stable. Plan per above.   5. CKD - recheck today prior to starting ARB.  Patient is agreeable to this plan and will call if any problems develop in the interim.   Burtis Junes, RN, [facility] [address] [place], [place]  [address] [phone]

## 2014-04-06 ENCOUNTER — Other Ambulatory Visit: Payer: Self-pay | Admitting: *Deleted

## 2014-04-06 ENCOUNTER — Other Ambulatory Visit: Payer: Self-pay | Admitting: Cardiovascular Disease

## 2014-04-06 DIAGNOSIS — I10 Essential (primary) hypertension: Secondary | ICD-10-CM

## 2014-04-06 MED ORDER — LOSARTAN POTASSIUM 25 MG PO TABS: 25.0000 mg | ORAL_TABLET | Freq: Every day | ORAL | Status: DC

## 2014-04-13 ENCOUNTER — Other Ambulatory Visit (INDEPENDENT_AMBULATORY_CARE_PROVIDER_SITE_OTHER): Payer: Medicare Other

## 2014-04-13 DIAGNOSIS — I10 Essential (primary) hypertension: Secondary | ICD-10-CM

## 2014-04-13 LAB — BASIC METABOLIC PANEL
BUN: 26 mg/dL — ABNORMAL HIGH (ref 6–23)
CO2: 21 mEq/L (ref 19–32)
Calcium: 9.3 mg/dL (ref 8.4–10.5)
Chloride: 111 mEq/L (ref 96–112)
Creatinine, Ser: 1.5 mg/dL — ABNORMAL HIGH (ref 0.4–1.2)
GFR: 36.29 mL/min — ABNORMAL LOW (ref >60.00)
Glucose, Bld: 102 mg/dL — ABNORMAL HIGH (ref 70–99)
Potassium: 4.2 mEq/L (ref 3.5–5.1)
Sodium: 139 mEq/L (ref 135–145)

## 2014-04-14 ENCOUNTER — Other Ambulatory Visit: Payer: Self-pay | Admitting: *Deleted

## 2014-04-14 DIAGNOSIS — N189 Chronic kidney disease, unspecified: Secondary | ICD-10-CM

## 2014-05-01 ENCOUNTER — Other Ambulatory Visit: Payer: Medicare Other

## 2014-05-03 ENCOUNTER — Other Ambulatory Visit (INDEPENDENT_AMBULATORY_CARE_PROVIDER_SITE_OTHER): Payer: Medicare Other | Admitting: *Deleted

## 2014-05-03 DIAGNOSIS — N189 Chronic kidney disease, unspecified: Secondary | ICD-10-CM

## 2014-05-03 LAB — BASIC METABOLIC PANEL
BUN: 22 mg/dL (ref 6–23)
CO2: 17 mEq/L — ABNORMAL LOW (ref 19–32)
Calcium: 8.9 mg/dL (ref 8.4–10.5)
Chloride: 111 mEq/L (ref 96–112)
Creatinine, Ser: 1.2 mg/dL (ref 0.4–1.2)
GFR: 46.33 mL/min — ABNORMAL LOW (ref >60.00)
Glucose, Bld: 85 mg/dL (ref 70–99)
Potassium: 4 mEq/L (ref 3.5–5.1)
Sodium: 136 mEq/L (ref 135–145)

## 2014-05-22 ENCOUNTER — Other Ambulatory Visit: Payer: Self-pay | Admitting: Internal Medicine

## 2014-05-22 ENCOUNTER — Ambulatory Visit (INDEPENDENT_AMBULATORY_CARE_PROVIDER_SITE_OTHER): Payer: Medicare Other | Admitting: *Deleted

## 2014-05-22 ENCOUNTER — Telehealth: Payer: Self-pay | Admitting: Cardiology

## 2014-05-22 ENCOUNTER — Encounter: Payer: Self-pay | Admitting: Internal Medicine

## 2014-05-22 ENCOUNTER — Encounter: Payer: Medicare Other | Admitting: *Deleted

## 2014-05-22 DIAGNOSIS — I255 Ischemic cardiomyopathy: Secondary | ICD-10-CM

## 2014-05-22 NOTE — Telephone Encounter (Signed)
LMOVM reminding pt to send remote transmission.   

## 2014-05-26 ENCOUNTER — Telehealth: Payer: Self-pay | Admitting: Nurse Practitioner

## 2014-05-26 NOTE — Telephone Encounter (Signed)
Left message for patient on both contact numbers regarding rescheduling 08/23/13 appointment per Carolyn's schedule.

## 2014-05-26 NOTE — Telephone Encounter (Signed)
Correction: 08/23/14 appointment.

## 2014-05-29 LAB — MDC_IDC_ENUM_SESS_TYPE_INCLINIC
Brady Statistic RV Percent Paced: 1 %
HighPow Impedance: 77 Ohm
Implantable Pulse Generator Serial Number: 1016523
Lead Channel Pacing Threshold Pulse Width: 0.5 ms
Lead Channel Sensing Intrinsic Amplitude: 12 mV
Lead Channel Setting Pacing Amplitude: 2.5 V
Lead Channel Setting Pacing Pulse Width: 0.5 ms
Lead Channel Setting Sensing Sensitivity: 0.5 mV
MDC IDC MSMT BATTERY REMAINING LONGEVITY: 80 mo
MDC IDC MSMT LEADCHNL RV IMPEDANCE VALUE: 490 Ohm
MDC IDC MSMT LEADCHNL RV PACING THRESHOLD AMPLITUDE: 0.75 V
MDC IDC SET ZONE DETECTION INTERVAL: 340 ms
Zone Setting Detection Interval: 280 ms

## 2014-05-29 NOTE — Progress Notes (Signed)
Remote ICD transmission.   

## 2014-06-14 ENCOUNTER — Encounter: Payer: Self-pay | Admitting: Cardiology

## 2014-06-26 ENCOUNTER — Encounter: Payer: Self-pay | Admitting: Cardiovascular Disease

## 2014-06-26 ENCOUNTER — Ambulatory Visit (INDEPENDENT_AMBULATORY_CARE_PROVIDER_SITE_OTHER): Payer: Medicare Other | Admitting: Cardiovascular Disease

## 2014-06-26 VITALS — BP 124/88 | HR 84 | Ht 62.0 in | Wt 155.6 lb

## 2014-06-26 DIAGNOSIS — I2510 Atherosclerotic heart disease of native coronary artery without angina pectoris: Secondary | ICD-10-CM

## 2014-06-26 DIAGNOSIS — I5022 Chronic systolic (congestive) heart failure: Secondary | ICD-10-CM

## 2014-06-26 NOTE — Progress Notes (Signed)
Background: The patient has a history of coronary disease with anterior wall MI. She had stent thrombosis in 2011. She's had severe residual left ventricular systolic dysfunction and has undergone ICD implantation. She's been maintained only on low-dose aspirin because of GI bleeding on dual antiplatelet therapy.   HPI:  68 year-old woman presenting for follow-up evaluation. The patient is doing well from a cardiac perspective. She denies any recent chest pain, chest pressure, lightheadedness, heart palpitations, or syncope. She has no orthopnea or PND. She does admit to mild dyspnea with exertion. Her main complaint relates to her toes on her right foot. She's having a lot of problems and thinks she needs surgery. Her toes are painful when wearing most of her shoes. She denies any skin changes or ulcerations on the feet.  Studies:  Lipid Panel     Component Value Date/Time   CHOL 139 01/27/2013 0857   TRIG 164.0* 01/27/2013 0857   HDL 44.90 01/27/2013 0857   CHOLHDL 3 01/27/2013 0857   VLDL 32.8 01/27/2013 0857   LDLCALC 61 01/27/2013 0857   2D Echo 05-22-2011: Study Conclusions  - Left ventricle: Septal apical and anterior wall hypokinesis. Cannot R/O mural apical thrombus Suggest MRI or contrast study The cavity size was severely dilated. Wall thickness was normal. The estimated ejection fraction was 25%. - Left atrium: The atrium was mildly dilated. - Atrial septum: No defect or patent foramen ovale was identified.  Myoview 08/08/2013: QPS Raw Data Images: Normal; no motion artifact; normal heart/lung ratio. Stress Images: There is decreased uptake in the apex. Rest Images: There is decreased uptake in the apex. Subtraction (SDS): No reversibility is appreciated. Transient Ischemic Dilatation (Normal <1.22): 0.99 Lung/Heart Ratio (Normal <0.45): 0.36  Quantitative Gated Spect Images QGS EDV: 157 ml QGS ESV: 117 ml  Impression Exercise Capacity:  Lexiscan with low level exercise. BP Response: Hypotensive blood pressure response. Clinical Symptoms: Chest heaviness. ECG Impression: No significant ECG changes with Lexiscan. Comparison with Prior Nuclear Study: No images to compare  Overall Impression: High risk stress nuclear study. There is a large area of scar of severe severity involving the apex and apical anterior and apical septal segments. There is no significant reversibility. There is significant LV systolic dysfunction with EF 25% and apical akinesis.  LV Ejection Fraction: 25%. LV Wall Motion: Large area of apical akinesis.  Outpatient Encounter Prescriptions as of 06/26/2014  Medication Sig  . albuterol (PROAIR HFA) 108 (90 BASE) MCG/ACT inhaler Inhale 2 puffs into the lungs every 6 (six) hours as needed. For shortness of breath  . amitriptyline (ELAVIL) 50 MG tablet Take 50 mg by mouth at bedtime.  Marland Kitchen aspirin 81 MG tablet Take 81 mg by mouth every morning.   . bisoprolol (ZEBETA) 5 MG tablet Take 1 tablet (5 mg total) by mouth daily.  . CRESTOR 20 MG tablet TAKE 1 TABLET BY MOUTH AT BEDTIME.  Marland Kitchen esomeprazole (NEXIUM) 40 MG capsule Take 1 capsule (40 mg total) by mouth daily.  Marland Kitchen losartan (COZAAR) 25 MG tablet Take 1 tablet (25 mg total) by mouth daily.  . Polyethylene Glycol POWD Take 1 packet by mouth as needed.   . polyethylene glycol powder (GLYCOLAX/MIRALAX) powder   . topiramate (TOPAMAX) 50 MG tablet Take 1 tablet (50 mg total) by mouth 2 (two) times daily.    Allergies  Allergen Reactions  . Lisinopril     cough  . Sulfa Antibiotics   . Sulfonamide Derivatives Other (See Comments)    UNSURE  Past Medical History  Diagnosis Date  . CAD (coronary artery disease) 02/2009    S/P anterior MI  . Hyperlipemia   . Benign neoplasm of colon   . Unspecified mastoiditis   . Irritable bowel syndrome   . Hypertension   . Diverticulosis   . Colon polyp   . Ischemic cardiomyopathy     EF 25%  . Chronic  systolic dysfunction of left ventricle   . Pulmonary embolism   . Chronic renal insufficiency   . Myocardial infarct   . Asthma   . Headache(784.0)   . Memory loss     family history includes Bladder Cancer in her brother; Breast cancer in her cousin; Colon cancer in her brother and mother.   ROS: Negative except as per HPI  BP 124/88 mmHg  Pulse 84  Ht 5\' 2"  (1.575 m)  Wt 155 lb 9.6 oz (70.58 kg)  BMI 28.45 kg/m2  SpO2 98%  PHYSICAL EXAM: Pt is alert and oriented, NAD HEENT: normal Neck: JVP - normal, carotids 2+= without bruits Lungs: CTA bilaterally CV: RRR without murmur or gallop Abd: soft, NT, Positive BS, no hepatomegaly Ext: no C/C/E, distal pulses intact and equal Skin: warm/dry no rash  EKG 02/21/2014: Sinus rhythm with age indeterminate anteroseptal MI  ASSESSMENT AND PLAN: 1. Chronic systolic heart failure secondary to severe ischemic cardiomyopathy (old anterior MI). The patient is stable from a cardiac perspective. She has minimal symptoms. She will continue on her current medical program which was reviewed today. There is no evidence of chest congestion or volume overload on exam.  2. Coronary artery disease, native vessel, without symptoms of angina. Continued stability noted, doing well on a combination of aspirin, beta blocker, and statin drug.  3. Status post ICD. Followed by Dr. Rayann Heman.  4. Thoracic aortic aneurysm. Followed by Dr. Prescott Gum with serial CT scans. Findings reviewed with continued stability noted.  5. Toe deformity: I think her cardiac risk with foot surgery would be low. The patient is asymptomatic without evidence of active ischemia, anginal symptoms, or sign/symptoms of decompensated heart failure. Most recent cardiac testing reviewed as outlined above. No further testing indicated at this time.  [name], MD 06/26/2014 2:55 PM

## 2014-06-26 NOTE — Patient Instructions (Signed)
Your physician wants you to follow-up in: 1 YEAR with Dr Cooper.  You will receive a reminder letter in the mail two months in advance. If you don't receive a letter, please call our office to schedule the follow-up appointment.  Your physician recommends that you continue on your current medications as directed. Please refer to the Current Medication list given to you today.  

## 2014-07-06 ENCOUNTER — Encounter (HOSPITAL_COMMUNITY): Payer: Self-pay | Admitting: Internal Medicine

## 2014-07-12 ENCOUNTER — Ambulatory Visit (INDEPENDENT_AMBULATORY_CARE_PROVIDER_SITE_OTHER): Payer: Medicare Other | Admitting: Podiatrist

## 2014-07-12 ENCOUNTER — Ambulatory Visit (INDEPENDENT_AMBULATORY_CARE_PROVIDER_SITE_OTHER): Payer: Medicare Other

## 2014-07-12 ENCOUNTER — Encounter: Payer: Self-pay | Admitting: Podiatrist

## 2014-07-12 VITALS — BP 161/85 | HR 78 | Resp 13 | Ht 62.0 in | Wt 154.0 lb

## 2014-07-12 DIAGNOSIS — M2042 Other hammer toe(s) (acquired), left foot: Secondary | ICD-10-CM

## 2014-07-12 DIAGNOSIS — I2510 Atherosclerotic heart disease of native coronary artery without angina pectoris: Secondary | ICD-10-CM

## 2014-07-12 DIAGNOSIS — M2012 Hallux valgus (acquired), left foot: Secondary | ICD-10-CM

## 2014-07-12 NOTE — Patient Instructions (Signed)
Bunion (Hallux Valgus) A bony bump (protrusion) on the inside of the foot, at the base of the first toe, is called a bunion (hallux valgus). A bunion causes the first toe to angle toward the other toes. SYMPTOMS   A bony bump on the inside of the foot, causing an outward turning of the first toe. It may also overlap the second toe.  Thickening of the skin (callus) over the bony bump.  Fluid buildup under the callus. Fluid may become red, tender, and swollen (inflamed) with constant irritation or pressure.  Foot pain and stiffness. CAUSES  Many causes exist, including:  Inherited from your family (genetics).  Injury (trauma) forcing the first toe into a position in which it overlaps other toes.  Bunions are also associated with wearing shoes that have a narrow toe box (pointy shoes). RISK INCREASES WITH:  Family history of foot abnormalities, especially bunions.  Arthritis.  Narrow shoes, especially high heels. PREVENTION  Wear shoes with a wide toe box.  Avoid shoes with high heels.  Wear a small pad between the big toe and second toe.  Maintain proper conditioning:  Foot and ankle flexibility.  Muscle strength and endurance. PROGNOSIS  With proper treatment, bunions can typically be cured. Occasionally, surgery is required.  RELATED COMPLICATIONS   Infection of the bunion.  Arthritis of the first toe.  Risks of surgery, including infection, bleeding, injury to nerves (numb toe), recurrent bunion, overcorrection (toe points inward), arthritis of the big toe, big toe pointing upward, and bone not healing. TREATMENT  Treatment first consists of stopping the activities that aggravate the pain, taking pain medicines, and icing to reduce inflammation and pain. Wear shoes with a wide toe box. Shoes can be modified by a shoe repair person to relieve pressure on the bunion, especially if you cannot find shoes with a wide enough toe box. You may also place a pad with the  center cut out in your shoe, to reduce pressure on the bunion. Sometimes, an arch support (orthotic) may reduce pressure on the bunion and alleviate the symptoms. Stretching and strengthening exercises for the muscles of the foot may be useful. You may choose to wear a brace or pad at night to hold the big toe away from the second toe. If non-surgical treatments are not successful, surgery may be needed. Surgery involves removing the overgrown tissue and correcting the position of the first toe, by realigning the bones. Bunion surgery is typically performed on an outpatient basis, meaning you can go home the same day as surgery. The surgery may involve cutting the mid portion of the bone of the first toe, or just cutting and repairing (reconstructing) the ligaments and soft tissues around the first toe.  MEDICATION   If pain medicine is needed, nonsteroidal anti-inflammatory medicines, such as aspirin and ibuprofen, or other minor pain relievers, such as acetaminophen, are often recommended.  Do not take pain medicine for 7 days before surgery.  Prescription pain relievers are usually only prescribed after surgery. Use only as directed and only as much as you need.  Ointments applied to the skin may be helpful. HEAT AND COLD  Cold treatment (icing) relieves pain and reduces inflammation. Cold treatment should be applied for 10 to 15 minutes every 2 to 3 hours for inflammation and pain and immediately after any activity that aggravates your symptoms. Use ice packs or an ice massage.  Heat treatment may be used prior to performing the stretching and strengthening activities prescribed by your   caregiver, physical therapist, or athletic trainer. Use a heat pack or a warm soak. SEEK MEDICAL CARE IF:   Symptoms get worse or do not improve in 2 weeks, despite treatment.  After surgery, you develop fever, increasing pain, redness, swelling, drainage of fluids, bleeding, or increasing warmth around the  surgical area.  New, unexplained symptoms develop. (Drugs used in treatment may produce side effects.) Document Released: 07/14/2005 Document Revised: 10/06/2011 Document Reviewed: 10/26/2008 Exit[employer] Patient Information 2015 [employer]. This information is not intended to replace advice given to you by your health care provider. Make sure you discuss any questions you have with your health care provider.  

## 2014-07-12 NOTE — Progress Notes (Deleted)
   Subjective:    Patient ID: [name], female    DOB: Apr 09, 1946, 68 y.o.   MRN: [id number]  HPI    Review of Systems  Genitourinary:       Currently being treated for kidney infection.  All other systems reviewed and are negative.      Objective:   Physical Exam        Assessment & Plan:

## 2014-07-17 NOTE — Progress Notes (Deleted)
  Chief Complaint  Patient presents with  . Hammer Toe    "My toes cross each other." left 2nd overlaps 1st toe     HPI: Patient is 68 y.o. female who presents today for ***   Review of Systems  DATA OBTAINED: from patient  GENERAL: Feels well no fevers, no fatigue, no changes in appetite SKIN: No itching, no rashes, no open wounds EYES: No eye pain,no redness, no discharge EARS: No earache,no ringing of ears, NOSE: No congestion, no drainage, no bleeding  MOUTH/THROAT: No mouth pain, No sore throat, No difficulty chewing or swallowing  RESPIRATORY: No cough, no wheezing, no SOB CARDIAC: No chest pain,no heart palpitations, GI: No abdominal pain, No Nausea, no vomiting, no diarrhea, no heartburn or no reflux  GU: No dysuria, no increased frequency or urgency MUSCULOSKELETAL: No unrelieved bone/joint pain,  NEUROLOGIC: Awake, alert, appropriate to situation, No change in mental status. PSYCHIATRIC: No overt anxiety or sadness.No behavior issue.      Physical Exam  GENERAL APPEARANCE: Alert, conversant. Appropriately groomed. No acute distress.  VASCULAR: Pedal pulses palpable at 2/4 DP and PT bilateral.  Capillary refill time is immediate to all digits,  Proximal to distal cooling it warm to warm.  Digital hair growth is present bilateral  NEUROLOGIC: sensation is intact epicritically and protectively to 5.07 monofilament at 5/5 sites bilateral.  Light touch is intact bilateral, vibratory sensation intact bilateral, achilles tendon reflex is intact bilateral.  MUSCULOSKELETAL: acceptable muscle strength, tone and stability bilateral.  Intrinsic muscluature intact bilateral.  Rectus appearance of foot and digits noted bilateral.   DERMATOLOGIC: skin color, texture, and turger are within normal limits.  No preulcerative lesions are seen, no interdigital maceration noted.  No open lesions present.  Digital nails are asymptomatic.    Patient Active Problem List   Diagnosis Date  Noted  . Headache(784.0) 01/03/2013  . 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 Cough 04/11/2012  . Sweating 03/17/2012  . ICD-St.Jude 08/06/2011  . Chronic systolic heart failure [id number]  . Cardiomyopathy, ischemic 06/12/2011  . CAD (coronary artery disease) 06/12/2011  . Ischemic cardiomyopathy 05/16/2011  . DDD (degenerative disc disease), lumbar 05/16/2011  . History of pulmonary embolus (PE) 01/02/2011  . UNSPECIFIED ANEMIA 07/03/2010  . HYPERTENSION 09/05/2009  . Shortness of breath 08/30/2009  . DIVERTICULOSIS-COLON 08/14/2009  . ABDOMINAL PAIN, LEFT LOWER QUADRANT 08/14/2009  . ABDOMINAL PAIN, GENERALIZED 08/14/2009  . HYPERLIPIDEMIA 04/24/2009  . AMI NOS, unspecified 04/24/2009  . CORONARY ARTERY DISEASE 04/24/2009  . NAUSEA 04/24/2009  . DYSPHAGIA UNSPECIFIED 04/24/2009  . PERSONAL HX COLONIC POLYPS 04/24/2009  . TOBACCO ABUSE 04/04/2009  . HYPERTENSION, BENIGN 04/04/2009  . Coronary atherosclerosis of native coronary artery 04/04/2009  . LEFT VENTRICULAR FUNCTION, DECREASED 04/04/2009  . CHEST PAIN 04/04/2009  . DYSPEPSIA 12/04/2008  . MASTOIDITIS 10/19/2007  . GERD 10/19/2007  . CONSTIPATION 10/19/2007  . IBS 10/19/2007  . COLONIC POLYPS, ADENOMATOUS 05/12/2007    Assessment   ***  Plan

## 2014-07-17 NOTE — Progress Notes (Signed)
Chief Complaint  Patient presents with  . Hammer Toe    "My toes cross each other." left 2nd overlaps 1st toe     HPI: Patient is 68 y.o. female who presents today for overlapping left 2nd toe which crosses over the first.  She has minimal to moderate pain in the toe especially when wearing certain shoes.  She has not had surgery on the toe in the past.    Allergies  Allergen Reactions  . Lisinopril     cough  . Sulfa Antibiotics   . Sulfonamide Derivatives Other (See Comments)    UNSURE    Physical Exam  Patient is awake, alert, and oriented x 3.  In no acute distress.  Vascular status is intact with palpable pedal pulses at 2/4 DP and PT bilateral and capillary refill time within normal limits. Neurological sensation is also intact bilaterally via Semmes Weinstein monofilament at 5/5 sites. Light touch, vibratory sensation, Achilles tendon reflex is intact. Dermatological exam reveals skin color, turger and texture as normal. No open lesions present.  Musculature intact with dorsiflexion, plantarflexion, inversion, eversion. Overlapping 2nd toe is present and an underlapping hallux is present as well.  xrays show large bunion with laterally deviated hallux and elongated second metatarsal with contracture and overlapping second toe.    Assessment: bunion, prominent metatarsal, hammertoe  Plan: discussed surgical treatments and alternatives.  She has had a heart attack, and bloodclot in the past and thus would need prophylaxis for a blood clot if she decides on surgery in the future.  She would need an aggressive bunion correction with likely a closing base wedge, akin, shortening metatarsal osteotomy and hammertoe correction.  I would need to contact her cardiologist and primary care doctor for pre operative clearance prior to surgery. If she would like to consider the surgery, she will call.

## 2014-08-23 ENCOUNTER — Ambulatory Visit: Payer: Medicare Other | Admitting: Nurse Practitioner

## 2014-09-05 ENCOUNTER — Other Ambulatory Visit: Payer: Self-pay | Admitting: Neurology

## 2014-09-14 ENCOUNTER — Ambulatory Visit (INDEPENDENT_AMBULATORY_CARE_PROVIDER_SITE_OTHER): Payer: Medicare Other | Admitting: *Deleted

## 2014-09-14 DIAGNOSIS — I255 Ischemic cardiomyopathy: Secondary | ICD-10-CM | POA: Diagnosis not present

## 2014-09-14 LAB — MDC_IDC_ENUM_SESS_TYPE_INCLINIC
Battery Remaining Longevity: 79.2 mo
Brady Statistic RV Percent Paced: 0 %
HIGH POWER IMPEDANCE MEASURED VALUE: 75.375
Implantable Pulse Generator Serial Number: 1016523
Lead Channel Impedance Value: 475 Ohm
Lead Channel Pacing Threshold Amplitude: 0.75 V
Lead Channel Pacing Threshold Amplitude: 0.75 V
Lead Channel Pacing Threshold Pulse Width: 0.5 ms
Lead Channel Sensing Intrinsic Amplitude: 12 mV
Lead Channel Setting Pacing Amplitude: 2.5 V
Lead Channel Setting Pacing Pulse Width: 0.5 ms
MDC IDC MSMT LEADCHNL RV PACING THRESHOLD PULSEWIDTH: 0.5 ms
MDC IDC SESS DTM: 20160218122957
MDC IDC SET LEADCHNL RV SENSING SENSITIVITY: 0.5 mV
MDC IDC SET ZONE DETECTION INTERVAL: 280 ms
Zone Setting Detection Interval: 340 ms

## 2014-09-14 NOTE — Progress Notes (Signed)
ICD check in clinic ([facility]). Normal device function. Threshold and sensing consistent with previous device measurements. Impedance trends stable over time. No evidence of any ventricular arrhythmias.  Histogram distribution appropriate for patient and level of activity. No changes made this session. Device programmed at appropriate safety margins. Device programmed to optimize intrinsic conduction. Estimated longevity 6.6-6.9 years. Pt enrolled in remote follow-up. Plan to follow up via [facility] on 5-19 and with JA in 02-2015.

## 2014-09-26 ENCOUNTER — Other Ambulatory Visit: Payer: Self-pay | Admitting: Neurology

## 2014-09-27 NOTE — Telephone Encounter (Signed)
Norva Pavlov sent in rx 09/05/14 with request for pt to make appt - no appt pending - pt will need to call office for refills.

## 2014-09-28 ENCOUNTER — Encounter: Payer: Self-pay | Admitting: Internal Medicine

## 2014-09-29 ENCOUNTER — Ambulatory Visit (INDEPENDENT_AMBULATORY_CARE_PROVIDER_SITE_OTHER): Payer: Medicare Other | Admitting: Neurology

## 2014-09-29 ENCOUNTER — Encounter: Payer: Self-pay | Admitting: Neurology

## 2014-09-29 VITALS — BP 160/95 | HR 82 | Ht 62.0 in | Wt 153.0 lb

## 2014-09-29 DIAGNOSIS — G43009 Migraine without aura, not intractable, without status migrainosus: Secondary | ICD-10-CM | POA: Diagnosis not present

## 2014-09-29 DIAGNOSIS — I255 Ischemic cardiomyopathy: Secondary | ICD-10-CM

## 2014-09-29 MED ORDER — TOPIRAMATE 50 MG PO TABS: 50.0000 mg | ORAL_TABLET | Freq: Every day | ORAL | Status: DC

## 2014-09-29 NOTE — Progress Notes (Signed)
[facility]  PATIENT: [name] DOB: August 07, 1945  HISTORICAL   Shaunte is a 69 year old right-handed white divorced female with  a history of headaches and essential tremor. Patient of Dr. Erling Cruz,   She  has history of multiple operations on the left mastoid between the ages of 7 and 15. She is currently on disability for that reason. She has CAD, PE, she has  congestive heart failure, COPD, irritable bowel syndrome, left ankle fracture, hyperlipidemia,  acute renal failure with stage III chronic renal disease, metabolic acidosis from Topamax, pulmonary embolism 03/2010, and a defibrillator was placed 08/2011.   She has history of migraine since age 89s, she complains of vertex area pressure headache, she has 2-6 headaches each month, She is taking amitriptyline 50mg  qhs, which has been helpful.  She took overcounter medication for nause, as needed, her headache can last all day long, ASA make her stomach hurt,   She brought her headache diarrhea, she had 4-6 headache each month,  UPDATE Jan 27th 2015:  Her headaches is overall much better, less frequent, but 5 headche very severe in 2 months, she had nause, vomitting with it. She wants more medication to help her preventing her severe migraines  Fioricet helps her some. But not the severe headaches.  UPDATE March 4th 2016: Her migraine has much improved, she only had  3-4 migraines since last visit in Jan 2015, tolerating topamax 50mg  bid, she is dread of pollen season when she gets most of her migraines  REVIEW OF SYSTEMS: Full 14 system review of systems performed and notable only for ear discharges, hearing loss, ringing in ears, abdominal pain, insomnia, frequent waking, back pain, dizziness, headaches,   ALLERGIES: Allergies  Allergen Reactions  . Lisinopril     cough  . Sulfa Antibiotics   . Sulfonamide Derivatives Other (See Comments)    UNSURE    HOME MEDICATIONS: Outpatient Prescriptions  Prior to Visit  Medication Sig Dispense Refill  . albuterol (PROAIR HFA) 108 (90 BASE) MCG/ACT inhaler Inhale 2 puffs into the lungs every 6 (six) hours as needed. For shortness of breath    . amitriptyline (ELAVIL) 50 MG tablet Take 50 mg by mouth at bedtime.    Marland Kitchen aspirin 81 MG tablet Take 81 mg by mouth every morning.     . bisoprolol (ZEBETA) 5 MG tablet Take 1 tablet (5 mg total) by mouth daily. 30 tablet 11  . CRESTOR 20 MG tablet TAKE 1 TABLET BY MOUTH AT BEDTIME. 30 tablet 6  . esomeprazole (NEXIUM) 40 MG capsule Take 1 capsule (40 mg total) by mouth daily.    Marland Kitchen losartan (COZAAR) 25 MG tablet Take 1 tablet (25 mg total) by mouth daily. 30 tablet 6  . Polyethylene Glycol POWD Take 1 packet by mouth as needed.     . polyethylene glycol powder (GLYCOLAX/MIRALAX) powder   1  . topiramate (TOPAMAX) 50 MG tablet TAKE 1 TABLET BY MOUTH 2 TIMES DAILY. 30 tablet 0   No facility-administered medications prior to visit.    PAST MEDICAL HISTORY: Past Medical History  Diagnosis Date  . CAD (coronary artery disease) 02/2009    S/P anterior MI  . Hyperlipemia   . Benign neoplasm of colon   . Unspecified mastoiditis   . Irritable bowel syndrome   . Hypertension   . Diverticulosis   . Colon polyp   . Ischemic cardiomyopathy     EF 25%  . Chronic systolic dysfunction of left  ventricle   . Pulmonary embolism   . Chronic renal insufficiency   . Myocardial infarct   . Asthma   . Headache(784.0)   . Memory loss     PAST SURGICAL HISTORY: Past Surgical History  Procedure Laterality Date  . Total abdominal hysterectomy    . Bilateral salpingoophorectomy    . Inner ear surgery      left x 17  . Cervical spine surgery  08/08  . Lumbar disc surgery  08/09  . Cad( bare metal stent)  8/10  . Angioplasty  07/02/09, 04/01/10  . Cardiac defibrillator placement  08/05/11    Primary prevention SJM ICD implanted,  Analyze ST study patient  . Implantable cardioverter defibrillator implant N/A  08/05/2011    Procedure: IMPLANTABLE CARDIOVERTER DEFIBRILLATOR IMPLANT;  Surgeon: Thompson Grayer, MD;  Location: [facility] CATH LAB;  Service: Cardiovascular;  Laterality: N/A;    FAMILY HISTORY: Family History  Problem Relation Age of Onset  . Colon cancer Mother   . Colon cancer Brother   . Bladder Cancer Brother   . Breast cancer Cousin     SOCIAL HISTORY:  History   Social History  . Marital Status: Divorced    Spouse Name: N/A  . Number of Children: 2  . Years of Education: 11   Occupational History  . Unemployed   .     Social History Main Topics  . Smoking status: Former Smoker -- 0.25 packs/day    Types: Cigarettes    Quit date: 04/27/2009  . Smokeless tobacco: Never Used  . Alcohol Use: No  . Drug Use: No  . Sexual Activity: Not on file   Other Topics Concern  . Not on file   Social History Narrative   Pt lives in [place] alone.  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   Patient has 11 th grade education.Right handed.   Caffeine- one cup daily           PHYSICAL EXAM   Filed Vitals:   09/29/14 0821  BP: 160/95  Pulse: 82  Height: 5\' 2"  (1.575 m)  Weight: 153 lb (69.4 kg)    Not recorded      Body mass index is 27.98 kg/(m^2).  PHYSICAL EXAMNIATION:  Gen: NAD, conversant, well nourised, obese, well groomed                     Cardiovascular: Regular rate rhythm, no peripheral edema, warm, nontender. Eyes: Conjunctivae clear without exudates or hemorrhage Neck: Supple, no carotid bruise. Pulmonary: Clear to auscultation bilaterally   NEUROLOGICAL EXAM:  MENTAL STATUS: Speech:    Speech is normal; fluent and spontaneous with normal comprehension. Hard of hearing. Cognition:    The patient is oriented to person, place, and time;     recent and remote memory intact;     language fluent;     normal attention, concentration,     fund of knowledge.  CRANIAL NERVES: CN II: Visual fields are full to confrontation. Fundoscopic exam is  normal with sharp discs and no vascular changes. Venous pulsations are present bilaterally. Pupils are 4 mm and briskly reactive to light. Visual acuity is 20/20 bilaterally. CN III, IV, VI: extraocular movement are normal. No ptosis. CN V: Facial sensation is intact to pinprick in all 3 divisions bilaterally. Corneal responses are intact.  CN VII: Face is symmetric with normal eye closure and smile. CN VIII: hard of hearing, left worse, bone conduction>air conduction CN IX, X: Palate  elevates symmetrically. Phonation is normal. CN XI: Head turning and shoulder shrug are intact CN XII: Tongue is midline with normal movements and no atrophy.  MOTOR: There is no pronator drift of out-stretched arms. Muscle bulk and tone are normal. Muscle strength is normal.   Shoulder abduction Shoulder external rotation Elbow flexion Elbow extension Wrist flexion Wrist extension Finger abduction Hip flexion Knee flexion Knee extension Ankle dorsi flexion Ankle plantar flexion  R 5 5 5 5 5 5 5 5 5 5 5 5   L 5 5 5 5 5 5 5 5 5 5 5 5     REFLEXES: Reflexes are 2+ and symmetric at the biceps, triceps, knees, and ankles. Plantar responses are flexor.  SENSORY: Light touch, pinprick, position sense, and vibration sense are intact in fingers and toes.  COORDINATION: Rapid alternating movements and fine finger movements are intact. There is no dysmetria on finger-to-nose and heel-knee-shin. There are no abnormal or extraneous movements.   GAIT/STANCE: Posture is normal. Gait is steady with normal steps, base, arm swing, and turning. Heel and toe walking are normal. Tandem gait is normal.  Romberg is absent.          DIAGNOSTIC DATA (LABS, IMAGING, TESTING) - I reviewed patient records, labs, notes, testing and imaging myself where available.  Lab Results  Component Value Date   WBC 10.8* 12/26/2012   HGB 13.2 12/26/2012   HCT 38.2 12/26/2012   MCV 95.0 12/26/2012   PLT 242 12/26/2012        Component Value Date/Time   NA 136 05/03/2014 1036   K 4.0 05/03/2014 1036   CL 111 05/03/2014 1036   CO2 17* 05/03/2014 1036   GLUCOSE 85 05/03/2014 1036   BUN 22 05/03/2014 1036   CREATININE 1.2 05/03/2014 1036   CREATININE 1.68* 12/12/2013 1023   CALCIUM 8.9 05/03/2014 1036   PROT 7.4 01/27/2013 0857   ALBUMIN 4.0 01/27/2013 0857   AST 16 01/27/2013 0857   ALT 12 01/27/2013 0857   ALKPHOS 86 01/27/2013 0857   BILITOT 0.6 01/27/2013 0857   GFRNONAA 54* 12/26/2012 0159   GFRAA 62* 12/26/2012 0159   Lab Results  Component Value Date   CHOL 139 01/27/2013   HDL 44.90 01/27/2013   LDLCALC 61 01/27/2013   TRIG 164.0* 01/27/2013   CHOLHDL 3 01/27/2013   Lab Results  Component Value Date   HGBA1C  03/07/2009    5.8 (NOTE) The ADA recommends the following therapeutic goal for glycemic control related to Hgb A1c measurement: Goal of therapy: <6.5 Hgb A1c  Reference: American Diabetes Association: Clinical Practice Recommendations 2010, Diabetes Care, 2010, 33: (Suppl  1).   Lab Results  Component Value Date   [id number] 04/06/2010   Lab Results  Component Value Date   TSH 2.892 04/08/2010      ASSESSMENT AND PLAN  69 years old right-handed Caucasian female, with past medical history of migraine headaches, also has past medical history of hypertension, hyperlipidemia, coronary artery disease, status post pacemaker placement  1. chronic migraine headaches, continue amitriptyline 50 mg every night as preventive medications and Topamax 50 mg twice a day 2. Fioricet,  Zofran as needed for abortive treatment   3. return to clinic in 3 months with Hoyle Sauer, if she is still doing well, may discharge to her pcp     Marcial Pacas, M.D. Ph.D.  [facility] [address], [place], [place] [address] [phone]

## 2014-10-31 ENCOUNTER — Other Ambulatory Visit: Payer: Self-pay | Admitting: Cardiovascular Disease

## 2014-10-31 ENCOUNTER — Other Ambulatory Visit: Payer: Self-pay | Admitting: Nurse Practitioner

## 2014-11-10 ENCOUNTER — Encounter: Payer: Self-pay | Admitting: *Deleted

## 2014-11-14 ENCOUNTER — Other Ambulatory Visit: Payer: Self-pay | Admitting: *Deleted

## 2014-11-14 DIAGNOSIS — I712 Thoracic aortic aneurysm, without rupture, unspecified: Secondary | ICD-10-CM

## 2014-11-15 ENCOUNTER — Other Ambulatory Visit: Payer: Self-pay | Admitting: *Deleted

## 2014-11-15 DIAGNOSIS — I712 Thoracic aortic aneurysm, without rupture, unspecified: Secondary | ICD-10-CM

## 2014-12-14 ENCOUNTER — Ambulatory Visit (INDEPENDENT_AMBULATORY_CARE_PROVIDER_SITE_OTHER): Payer: Medicare Other | Admitting: *Deleted

## 2014-12-14 ENCOUNTER — Encounter: Payer: Self-pay | Admitting: Internal Medicine

## 2014-12-14 DIAGNOSIS — I255 Ischemic cardiomyopathy: Secondary | ICD-10-CM

## 2014-12-14 NOTE — Progress Notes (Signed)
Remote ICD transmission.   

## 2014-12-18 LAB — BUN: BUN: 25 mg/dL — ABNORMAL HIGH (ref 6–23)

## 2014-12-18 LAB — CREATININE, SERUM: Creat: 1.51 mg/dL — ABNORMAL HIGH (ref 0.50–1.10)

## 2014-12-19 ENCOUNTER — Encounter: Payer: Medicare Other | Admitting: Cardiothoracic Surgery

## 2014-12-19 LAB — CUP PACEART REMOTE DEVICE CHECK
Battery Remaining Longevity: 6.4
Date Time Interrogation Session: 20160524232247
HIGH POWER IMPEDANCE MEASURED VALUE: 71 Ohm
Lead Channel Impedance Value: 480 Ohm
Lead Channel Setting Pacing Amplitude: 2.5 V
Lead Channel Setting Sensing Sensitivity: 0.5 mV
MDC IDC MSMT LEADCHNL RV SENSING INTR AMPL: 12 mV
MDC IDC SET LEADCHNL RV PACING PULSEWIDTH: 0.5 ms
MDC IDC SET ZONE DETECTION INTERVAL: 340 ms
MDC IDC STAT BRADY RV PERCENT PACED: 1 % — AB
Pulse Gen Serial Number: 1016523
Zone Setting Detection Interval: 280 ms

## 2014-12-20 ENCOUNTER — Ambulatory Visit
Admission: RE | Admit: 2014-12-20 | Discharge: 2014-12-20 | Disposition: A | Discharge status: Home / Self Care | Payer: Medicare Other | Source: Ambulatory Visit | Attending: Cardiothoracic Surgery | Admitting: Cardiothoracic Surgery

## 2014-12-20 ENCOUNTER — Encounter: Payer: Medicare Other | Admitting: Cardiothoracic Surgery

## 2014-12-20 ENCOUNTER — Encounter (INDEPENDENT_AMBULATORY_CARE_PROVIDER_SITE_OTHER): Payer: Self-pay

## 2014-12-20 DIAGNOSIS — I712 Thoracic aortic aneurysm, without rupture, unspecified: Secondary | ICD-10-CM

## 2014-12-27 ENCOUNTER — Other Ambulatory Visit: Payer: Self-pay | Admitting: Physician Assistant

## 2014-12-28 ENCOUNTER — Ambulatory Visit (INDEPENDENT_AMBULATORY_CARE_PROVIDER_SITE_OTHER): Payer: Medicare Other | Admitting: Cardiothoracic Surgery

## 2014-12-28 ENCOUNTER — Encounter: Payer: Self-pay | Admitting: Cardiothoracic Surgery

## 2014-12-28 VITALS — BP 123/79 | HR 72 | Resp 16 | Ht 62.0 in | Wt 154.0 lb

## 2014-12-28 DIAGNOSIS — I712 Thoracic aortic aneurysm, without rupture, unspecified: Secondary | ICD-10-CM

## 2014-12-28 DIAGNOSIS — I255 Ischemic cardiomyopathy: Secondary | ICD-10-CM

## 2014-12-28 NOTE — Progress Notes (Signed)
PCP is ANDY,CAMILLE L, MD Referring Provider is Leamon Arnt, MD  Chief Complaint  Patient presents with  . 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 This is been followed for 3 years without change. This is asymptomatic. On CT scan today there is no change--no intramural hematoma or ulceration. The radiology report states this may be a ductus bump with calcification.she is controlling this with blood pressure control and she is a nonsmoker and takes a statin.  The patient had an anterior MI treated with PCI-stent but was left with a EF of 25%. In talking with the patient is clear that her symptoms of heart failure have progressed. She complains of increasing dyspnea on exertion, new onset orthopnea, and decreased exercise tolerance. The patient has been treated with an AICD which has never shock her. She is on heart failure meds but would benefit from being directed to the cancer care clinic for further followup. She had a myocardial perfusion scan by Dr. Burt Knack which showed the large anterior scar and no evidence of other areas of myocardial ischemia. Past Medical History  Diagnosis Date  . CAD (coronary artery disease) 02/2009    S/P anterior MI  . Hyperlipemia   . Benign neoplasm of colon   . Unspecified mastoiditis   . Irritable bowel syndrome   . Hypertension   . Diverticulosis   . Colon polyp   . Ischemic cardiomyopathy     EF 25%  . Chronic systolic dysfunction of left ventricle   . Pulmonary embolism   . Chronic renal insufficiency   . Myocardial infarct   . Asthma   . Headache(784.0)   . Memory loss     Past Surgical History  Procedure Laterality Date  . Total abdominal hysterectomy    . Bilateral salpingoophorectomy    . Inner ear surgery      left x 17  . Cervical spine surgery  08/08  . Lumbar disc surgery  08/09   . Cad( bare metal stent)  8/10  . Angioplasty  07/02/09, 04/01/10  . Cardiac defibrillator placement  08/05/11    Primary prevention SJM ICD implanted,  Analyze ST study patient  . Implantable cardioverter defibrillator implant N/A 08/05/2011    Procedure: IMPLANTABLE CARDIOVERTER DEFIBRILLATOR IMPLANT;  Surgeon: Thompson Grayer, MD;  Location: [facility] CATH LAB;  Service: Cardiovascular;  Laterality: N/A;    Family History  Problem Relation Age of Onset  . Colon cancer Mother   . Colon cancer Brother   . Bladder Cancer Brother   . Breast cancer Cousin     Social History History  Substance Use Topics  . Smoking status: Former Smoker -- 0.25 packs/day    Types: Cigarettes    Quit date: 04/27/2009  . Smokeless tobacco: Never Used  . Alcohol Use: No    Current Outpatient Prescriptions  Medication Sig Dispense Refill  . albuterol (PROAIR HFA) 108 (90 BASE) MCG/ACT inhaler Inhale 2 puffs into the lungs every 6 (six) hours as needed. For shortness of breath    . amitriptyline (ELAVIL) 50 MG tablet Take 50 mg by mouth at bedtime.    Marland Kitchen aspirin 81 MG tablet Take 81 mg by mouth every morning.     . bisoprolol (ZEBETA) 5 MG tablet Take 1 tablet (5 mg total) by mouth daily. 30 tablet 11  . CRESTOR  20 MG tablet TAKE 1 TABLET BY MOUTH AT BEDTIME. 30 tablet 6  . esomeprazole (NEXIUM) 40 MG capsule Take 1 capsule (40 mg total) by mouth daily.    Marland Kitchen losartan (COZAAR) 25 MG tablet TAKE 1 TABLET (25 MG TOTAL) BY MOUTH DAILY. 30 tablet 6  . polyethylene glycol powder (GLYCOLAX/MIRALAX) powder   1  . topiramate (TOPAMAX) 50 MG tablet Take 1 tablet (50 mg total) by mouth daily. 180 tablet 3   No current facility-administered medications for this visit.    Allergies  Allergen Reactions  . Lisinopril     cough  . Sulfa Antibiotics   . Sulfonamide Derivatives Other (See Comments)    UNSURE    Review of Systems    Review of Systems  General:  No weight loss   no fever   no decreased energy  no night  sweats Cardiac:  -Chest pain with exertion  +resting chest pain   ++SOB with exertion +++  Orthopnea                  -PND  Ankle  - edema  syncope Pulmonary:  no dyspnea,no cough, no productive cough no home oxygen no hemoptysis GI: no difficulty swallowing  no GERD no jaundice  no melena  no hematemesis no        abdominal pain GU:  no dysuria  no hematuria  no frequent UTI no BPH Vascular:  no claudication  No TIA  No varicose veins no DVT Neuro:  no sroke no seizures no TIA no head trauma no vision changes Musculoskeletal:  normal mobility no arthritis  no gout  no joint swelling Skin: no rash  no skin ulceration  no skin cancer Endocrine: diabetes  no thyroid didease Hematologic: no easy bruising  no blood transfusions  no frequent epistaxis ENT : no painful teeth no dentures no loose teeth Psych : no anxiety  no depression  o psych hospitalizations        BP 123/79 mmHg  Pulse 72  Resp 16  Ht 5\' 2"  (1.575 m)  Wt 154 lb (69.854 kg)  BMI 28.16 kg/m2  SpO2 98% Physical Exam       Physical Exam  General: Middle-aged female no acute distress-she denies smoking HEENT: Normocephalic pupils equal , dentition adequate Neck: Supple without JVD, adenopathy, or bruit Chest: Clear to auscultation, symmetrical breath sounds, no rhonchi, no tenderness             or deformity Cardiovascular: Regular rate and rhythm, no murmur, no gallop, peripheral pulses             palpable in all extremities Abdomen:  Soft, nontender, no palpable mass or organomegaly Extremities: Warm, well-perfused, no clubbing cyanosis edema or tenderness,              no venous stasis changes of the legs Rectal/GU: Deferred Neuro: Grossly non--focal and symmetrical throughout Skin: Clean and dry without rash or ulceration   Diagnostic Tests: CT scan of chest without contrast-creatinine 1.5-shows no change in the 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 No evidence  of intramural hematoma or ulceration.  Impression: Chronic thoracic aortic disease-low risk for dissection We'll continue to monitor with serial CT scans without contrast but increase the interval between scans  Plan: She will be referred to the advanced heart care clinic for advancing symptoms of heart failure and known ischemic cardiomyopathy status post AICD placement  Len Childs, MD [facility] [phone]

## 2015-01-01 ENCOUNTER — Ambulatory Visit: Payer: Medicare Other | Admitting: Nurse Practitioner

## 2015-01-03 ENCOUNTER — Encounter: Payer: Self-pay | Admitting: Cardiology

## 2015-01-18 ENCOUNTER — Telehealth (HOSPITAL_COMMUNITY): Payer: Self-pay | Admitting: Vascular Surgery
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
## 2015-01-18 NOTE — Telephone Encounter (Signed)
She was a new pt referral from Dr Nils Pyle, can you please see if she can reschedule, thanks
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
## 2015-01-22 ENCOUNTER — Ambulatory Visit (HOSPITAL_COMMUNITY): Payer: Medicare Other

## 2015-01-30 NOTE — Telephone Encounter (Signed)
Dr Lucianne Lei Tright's office aware

## 2015-03-08 ENCOUNTER — Ambulatory Visit (HOSPITAL_COMMUNITY)
Admission: RE | Admit: 2015-03-08 | Discharge: 2015-03-08 | Disposition: A | Discharge status: Home / Self Care | Payer: Medicare Other | Source: Ambulatory Visit | Attending: Internal Medicine | Admitting: Internal Medicine

## 2015-03-08 ENCOUNTER — Encounter (HOSPITAL_COMMUNITY): Payer: Self-pay

## 2015-03-08 VITALS — BP 118/74 | HR 88 | Wt 159.0 lb

## 2015-03-08 DIAGNOSIS — R413 Other amnesia: Secondary | ICD-10-CM | POA: Insufficient documentation

## 2015-03-08 DIAGNOSIS — E785 Hyperlipidemia, unspecified: Secondary | ICD-10-CM | POA: Insufficient documentation

## 2015-03-08 DIAGNOSIS — R0602 Shortness of breath: Secondary | ICD-10-CM

## 2015-03-08 DIAGNOSIS — I255 Ischemic cardiomyopathy: Secondary | ICD-10-CM | POA: Insufficient documentation

## 2015-03-08 DIAGNOSIS — N183 Chronic kidney disease, stage 3 (moderate): Secondary | ICD-10-CM | POA: Diagnosis not present

## 2015-03-08 DIAGNOSIS — Z882 Allergy status to sulfonamides status: Secondary | ICD-10-CM | POA: Insufficient documentation

## 2015-03-08 DIAGNOSIS — I129 Hypertensive chronic kidney disease with stage 1 through stage 4 chronic kidney disease, or unspecified chronic kidney disease: Secondary | ICD-10-CM | POA: Diagnosis not present

## 2015-03-08 DIAGNOSIS — J449 Chronic obstructive pulmonary disease, unspecified: Secondary | ICD-10-CM | POA: Diagnosis not present

## 2015-03-08 DIAGNOSIS — Z79899 Other long term (current) drug therapy: Secondary | ICD-10-CM | POA: Diagnosis not present

## 2015-03-08 DIAGNOSIS — I712 Thoracic aortic aneurysm, without rupture: Secondary | ICD-10-CM | POA: Insufficient documentation

## 2015-03-08 DIAGNOSIS — Z86711 Personal history of pulmonary embolism: Secondary | ICD-10-CM | POA: Diagnosis not present

## 2015-03-08 DIAGNOSIS — Z7982 Long term (current) use of aspirin: Secondary | ICD-10-CM | POA: Diagnosis not present

## 2015-03-08 DIAGNOSIS — Z87891 Personal history of nicotine dependence: Secondary | ICD-10-CM | POA: Diagnosis not present

## 2015-03-08 DIAGNOSIS — Z95810 Presence of automatic (implantable) cardiac defibrillator: Secondary | ICD-10-CM | POA: Diagnosis not present

## 2015-03-08 DIAGNOSIS — I252 Old myocardial infarction: Secondary | ICD-10-CM | POA: Insufficient documentation

## 2015-03-08 DIAGNOSIS — J45909 Unspecified asthma, uncomplicated: Secondary | ICD-10-CM | POA: Insufficient documentation

## 2015-03-08 DIAGNOSIS — I5022 Chronic systolic (congestive) heart failure: Secondary | ICD-10-CM

## 2015-03-08 DIAGNOSIS — I2510 Atherosclerotic heart disease of native coronary artery without angina pectoris: Secondary | ICD-10-CM

## 2015-03-08 MED ORDER — LOSARTAN POTASSIUM 50 MG PO TABS: 50.0000 mg | ORAL_TABLET | Freq: Every day | ORAL | Status: DC

## 2015-03-08 NOTE — Progress Notes (Signed)
ADVANCED HF CONSULT NOTE  Patient ID: [name], female   DOB: 09/20/1945, 69 y.o.   MRN: [id number] PCP is ANDY,CAMILLE L, MD Referring Provider is Leamon Arnt, MD  Primary Cardiologist: Burt Knack   HPI:  [name] is a 69 y/o woman with h/o CAD s/p anterior wall MI, COPD (quit smoking 2013),  chronic systolic HF EF [id number] s/p St Jude ICD, CKD (baseline creatinine 1.5),  HTN, memory loss and h/o GI bleeding on DAPT. REferred by Dr. Prescott Gum for further evaluation of her HF.  She has been followed by Dr. Burt Knack and been doing ok. She lives alone and can do all her ADLs and go to the store without oo much problem. However, she notes that she gets SOB with very mild activity such as walking to the mailbox. This is is stable. Very rare CP. No edema, orthopnea or PND. She does not like to weight herself. She is compliant with all her meds. She says drinks a lot of water to protect her kidneys. She has a lot of problems with her memory.   ECHO 2012 EF 25% MYOVIEW 2015: There is a large area of scar of severe severity involving the apex and apical anterior and apical septal segments. There is no significant reversibility. There is significant LV systolic dysfunction with EF 25% and apical akinesis. CT SCAN CHEST: The thoracic aorta again demonstrates focal dilatation in the region of the previous ductus arteriosus. Again this may represent a ductus diverticulum or focal small aneurysm. The overall size is stable measuring 3.25 cm in greatest dimension on the sagittal imaging.   Past Medical History  Diagnosis Date  . CAD (coronary artery disease) 02/2009    S/P anterior MI  . Hyperlipemia   . Benign neoplasm of colon   . Unspecified mastoiditis   . Irritable bowel syndrome   . Hypertension   . Diverticulosis   . Colon polyp   . Ischemic cardiomyopathy     EF 25%  . Chronic systolic dysfunction of left ventricle   . Pulmonary embolism   . Chronic renal insufficiency   . Myocardial  infarct   . Asthma   . Headache(784.0)   . Memory loss     Past Surgical History  Procedure Laterality Date  . Total abdominal hysterectomy    . Bilateral salpingoophorectomy    . Inner ear surgery      left x 17  . Cervical spine surgery  08/08  . Lumbar disc surgery  08/09  . Cad( bare metal stent)  8/10  . Angioplasty  07/02/09, 04/01/10  . Cardiac defibrillator placement  08/05/11    Primary prevention SJM ICD implanted,  Analyze ST study patient  . Implantable cardioverter defibrillator implant N/A 08/05/2011    Procedure: IMPLANTABLE CARDIOVERTER DEFIBRILLATOR IMPLANT;  Surgeon: Thompson Grayer, MD;  Location: [facility] CATH LAB;  Service: Cardiovascular;  Laterality: N/A;    Family History  Problem Relation Age of Onset  . Colon cancer Mother   . Colon cancer Brother   . Bladder Cancer Brother   . Breast cancer Cousin     Social History Social History  Substance Use Topics  . Smoking status: Former Smoker -- 0.25 packs/day    Types: Cigarettes    Quit date: 04/27/2009  . Smokeless tobacco: Never Used  . Alcohol Use: No    Current Outpatient Prescriptions  Medication Sig Dispense Refill  . albuterol (PROAIR HFA) 108 (90 BASE) MCG/ACT inhaler Inhale 2 puffs into the  lungs every 6 (six) hours as needed. For shortness of breath    . amitriptyline (ELAVIL) 50 MG tablet Take 50 mg by mouth at bedtime.    Marland Kitchen aspirin 81 MG tablet Take 81 mg by mouth every morning.     . bisoprolol (ZEBETA) 5 MG tablet TAKE 1 TABLET BY MOUTH DAILY. 90 tablet 3  . CRESTOR 20 MG tablet TAKE 1 TABLET BY MOUTH AT BEDTIME. 30 tablet 6  . esomeprazole (NEXIUM) 40 MG capsule Take 1 capsule (40 mg total) by mouth daily.    Marland Kitchen losartan (COZAAR) 25 MG tablet TAKE 1 TABLET (25 MG TOTAL) BY MOUTH DAILY. 30 tablet 6  . polyethylene glycol powder (GLYCOLAX/MIRALAX) powder   1  . topiramate (TOPAMAX) 50 MG tablet Take 1 tablet (50 mg total) by mouth daily. 180 tablet 3   No current facility-administered medications  for this encounter.    Allergies  Allergen Reactions  . Lisinopril     cough  . Sulfa Antibiotics   . Sulfonamide Derivatives Other (See Comments)    UNSURE    Review of Systems: As per HPI. Otherwise negative.       BP 118/74 mmHg  Pulse 88  Wt 159 lb (72.122 kg)  SpO2 97% Physical Exam  Hall walk Very dyspneic afterward sats 96-97% General:  Well appearing. No resp difficulty HEENT: normal Neck: supple. no JVD. Carotids 2+ bilat; no bruits. No lymphadenopathy or thryomegaly appreciated. Cor: PMI laterally displaced. Regular rate & rhythm. No rubs, gallops or murmurs. Lungs: clear Abdomen: soft, nontender, nondistended. No hepatosplenomegaly. No bruits or masses. Good bowel sounds. Extremities: no cyanosis, rash, edema. ? Mild clubbing Neuro: alert & orientedx3, cranial nerves grossly intact. moves all 4 extremities w/o difficulty. Affect pleasant  ECG: NSR 82. Old anteroseptal infarct. IVCD 154ms. LAFB   Assessment: 1. Chronic systolic HF due to iCM EF 25% 2. CAD s/p previous anterior MI 3. CKD, stage III 4. COPD 5. Memory problems  6. Small thoracic aortic aneurysm  Overall stable NYHA III. Volume status looks good. Given memory problems and COPD she is not a candidate for advanced therapies. I will take the liberty of updating her echo and ordering PFTs. Will switch losartan to [name] 24/26 if she can afford. She will f/u with Dr. Burt Knack. We are happy to see her back at any time. Can consider titrating bisoprolol or adding spiro down the road as tolerated.   Total time spent 45 minutes. Over half that time spent discussing above.   Bensimhon, Daniel,MD 3:08 PM

## 2015-03-08 NOTE — Addendum Note (Signed)
Encounter addended by: Effie Berkshire, RN on: 03/08/2015  3:29 PM<BR>     Documentation filed: Visit Diagnoses, Dx Association, Patient Instructions Section, Orders

## 2015-03-08 NOTE — Addendum Note (Signed)
Encounter addended by: Effie Berkshire, RN on: 03/08/2015  3:34 PM<BR>     Documentation filed: Patient Instructions Section, Orders

## 2015-03-08 NOTE — Patient Instructions (Addendum)
Will schedule you for Pulmonary Function Test at [facility].  Will schedule you for echocardiogram at [facility]. [address] [place], [place] [address] [phone]  Increase Losartan to 50mg  once daily.  Follow up as needed.

## 2015-03-13 ENCOUNTER — Ambulatory Visit (HOSPITAL_COMMUNITY): Payer: Medicare Other | Attending: Internal Medicine

## 2015-03-13 ENCOUNTER — Other Ambulatory Visit: Payer: Self-pay

## 2015-03-13 DIAGNOSIS — I5022 Chronic systolic (congestive) heart failure: Secondary | ICD-10-CM | POA: Diagnosis not present

## 2015-03-13 DIAGNOSIS — F17200 Nicotine dependence, unspecified, uncomplicated: Secondary | ICD-10-CM | POA: Diagnosis not present

## 2015-03-13 DIAGNOSIS — E785 Hyperlipidemia, unspecified: Secondary | ICD-10-CM | POA: Diagnosis not present

## 2015-03-13 DIAGNOSIS — I517 Cardiomegaly: Secondary | ICD-10-CM | POA: Insufficient documentation

## 2015-03-13 DIAGNOSIS — I340 Nonrheumatic mitral (valve) insufficiency: Secondary | ICD-10-CM | POA: Diagnosis not present

## 2015-03-13 DIAGNOSIS — I10 Essential (primary) hypertension: Secondary | ICD-10-CM | POA: Diagnosis not present

## 2015-03-13 DIAGNOSIS — I509 Heart failure, unspecified: Secondary | ICD-10-CM | POA: Diagnosis present

## 2015-03-13 MED ORDER — PERFLUTREN LIPID MICROSPHERE
1.0000 mL | Freq: Once | INTRAVENOUS | Status: AC
  Administered 2015-03-13: 1 mL via INTRAVENOUS

## 2015-03-16 ENCOUNTER — Ambulatory Visit (HOSPITAL_COMMUNITY)
Admission: RE | Admit: 2015-03-16 | Discharge: 2015-03-16 | Disposition: A | Discharge status: Home / Self Care | Payer: Medicare Other | Source: Ambulatory Visit | Attending: Internal Medicine | Admitting: Internal Medicine

## 2015-03-16 DIAGNOSIS — R0602 Shortness of breath: Secondary | ICD-10-CM | POA: Insufficient documentation

## 2015-03-16 LAB — PULMONARY FUNCTION TEST
DL/VA % PRED: 76 %
DL/VA: 3.56 ml/min/mmHg/L
DLCO UNC % PRED: 61 %
DLCO UNC: 14.06 ml/min/mmHg
FEF 25-75 POST: 2.08 L/s
FEF 25-75 PRE: 1.45 L/s
FEF2575-%Change-Post: 43 %
FEF2575-%PRED-PRE: 74 %
FEF2575-%Pred-Post: 106 %
FEV1-%Change-Post: 12 %
FEV1-%Pred-Post: 82 %
FEV1-%Pred-Pre: 73 %
FEV1-Post: 1.85 L
FEV1-Pre: 1.65 L
FEV1FVC-%Change-Post: 3 %
FEV1FVC-%Pred-Pre: 100 %
FEV6-%CHANGE-POST: 8 %
FEV6-%PRED-PRE: 75 %
FEV6-%Pred-Post: 82 %
FEV6-POST: 2.33 L
FEV6-Pre: 2.15 L
FEV6FVC-%CHANGE-POST: 0 %
FEV6FVC-%PRED-POST: 104 %
FEV6FVC-%Pred-Pre: 103 %
FVC-%Change-Post: 8 %
FVC-%Pred-Post: 78 %
FVC-%Pred-Pre: 72 %
FVC-PRE: 2.15 L
FVC-Post: 2.34 L
POST FEV6/FVC RATIO: 100 %
PRE FEV1/FVC RATIO: 77 %
Post FEV1/FVC ratio: 79 %
Pre FEV6/FVC Ratio: 100 %
RV % pred: 134 %
RV: 2.81 L
TLC % PRED: 96 %
TLC: 4.73 L

## 2015-03-16 MED ORDER — ALBUTEROL SULFATE (2.5 MG/3ML) 0.083% IN NEBU
2.5000 mg | INHALATION_SOLUTION | Freq: Once | RESPIRATORY_TRACT | Status: AC
  Administered 2015-03-16: 2.5 mg via RESPIRATORY_TRACT

## 2015-04-09 ENCOUNTER — Other Ambulatory Visit (INDEPENDENT_AMBULATORY_CARE_PROVIDER_SITE_OTHER): Payer: Medicare Other

## 2015-04-09 ENCOUNTER — Ambulatory Visit (INDEPENDENT_AMBULATORY_CARE_PROVIDER_SITE_OTHER): Payer: Medicare Other | Admitting: Physician Assistant

## 2015-04-09 ENCOUNTER — Encounter: Payer: Self-pay | Admitting: Physician Assistant

## 2015-04-09 ENCOUNTER — Encounter: Payer: Medicare Other | Admitting: Nurse Practitioner

## 2015-04-09 VITALS — BP 110/62 | HR 60 | Ht 62.25 in | Wt 159.4 lb

## 2015-04-09 DIAGNOSIS — R1012 Left upper quadrant pain: Secondary | ICD-10-CM

## 2015-04-09 DIAGNOSIS — R1013 Epigastric pain: Secondary | ICD-10-CM

## 2015-04-09 DIAGNOSIS — I255 Ischemic cardiomyopathy: Secondary | ICD-10-CM

## 2015-04-09 DIAGNOSIS — R1011 Right upper quadrant pain: Secondary | ICD-10-CM | POA: Diagnosis not present

## 2015-04-09 DIAGNOSIS — R110 Nausea: Secondary | ICD-10-CM | POA: Diagnosis not present

## 2015-04-09 LAB — CBC WITH DIFFERENTIAL/PLATELET
BASOS ABS: 0 10*3/uL (ref 0.0–0.1)
Basophils Relative: 0.5 % (ref 0.0–3.0)
EOS ABS: 0.1 10*3/uL (ref 0.0–0.7)
Eosinophils Relative: 2.1 % (ref 0.0–5.0)
HEMATOCRIT: 36.9 % (ref 36.0–46.0)
Hemoglobin: 12.3 g/dL (ref 12.0–15.0)
LYMPHS PCT: 37.3 % (ref 12.0–46.0)
Lymphs Abs: 2.3 10*3/uL (ref 0.7–4.0)
MCHC: 33.4 g/dL (ref 30.0–36.0)
MCV: 98.3 fl (ref 78.0–100.0)
Monocytes Absolute: 0.4 10*3/uL (ref 0.1–1.0)
Monocytes Relative: 5.8 % (ref 3.0–12.0)
NEUTROS ABS: 3.4 10*3/uL (ref 1.4–7.7)
Neutrophils Relative %: 54.3 % (ref 43.0–77.0)
Platelets: 187 10*3/uL (ref 150.0–400.0)
RBC: 3.76 Mil/uL — ABNORMAL LOW (ref 3.87–5.11)
RDW: 13.8 % (ref 11.5–15.5)
WBC: 6.2 10*3/uL (ref 4.0–10.5)

## 2015-04-09 LAB — COMPREHENSIVE METABOLIC PANEL
ALT: 10 U/L (ref 0–35)
AST: 12 U/L (ref 0–37)
Albumin: 3.9 g/dL (ref 3.5–5.2)
Alkaline Phosphatase: 93 U/L (ref 39–117)
BUN: 26 mg/dL — ABNORMAL HIGH (ref 6–23)
CALCIUM: 9 mg/dL (ref 8.4–10.5)
CHLORIDE: 110 meq/L (ref 96–112)
CO2: 23 mEq/L (ref 19–32)
Creatinine, Ser: 1.41 mg/dL — ABNORMAL HIGH (ref 0.40–1.20)
GFR: 39.46 mL/min — AB (ref >60.00)
GLUCOSE: 82 mg/dL (ref 70–99)
Potassium: 4.5 mEq/L (ref 3.5–5.1)
Sodium: 139 mEq/L (ref 135–145)
Total Bilirubin: 0.4 mg/dL (ref 0.2–1.2)
Total Protein: 6.8 g/dL (ref 6.0–8.3)

## 2015-04-09 LAB — LIPASE: LIPASE: 50 U/L (ref 11.0–59.0)

## 2015-04-09 LAB — AMYLASE: Amylase: 38 U/L (ref 27–131)

## 2015-04-09 NOTE — Patient Instructions (Signed)
Please go to the basement level to have your labs drawn.  Continue Nexium 40 mg by mouth every morning.   You have been scheduled for a CT scan of the abdomen and pelvis at [facility] ([address]---this is in the same building as Press photographer).   You are scheduled on Thursday  04-12-2015 at  1:00 PM . You should arrive 15 minutes prior to your appointment time for registration. Please follow the written instructions below on the day of your exam:  WARNING: IF YOU ARE ALLERGIC TO IODINE/X-RAY DYE, PLEASE NOTIFY RADIOLOGY IMMEDIATELY AT [phone]! YOU WILL BE GIVEN A 13 HOUR PREMEDICATION PREP.  1) Do not eat or drink anything after  9:00 am  (4 hours prior to your test) 2) You have been given 2 bottles of oral contrast to drink. The solution may taste better if refrigerated, but do NOT add ice or any other liquid to this solution. Shake well before drinking.    Drink 1 bottle of contrast @ 11:00 am  (2 hours prior to your exam)  Drink 1 bottle of contrast @ 12:00 Noon (1 hour prior to your exam)  You may take any medications as prescribed with a small amount of water except for the following: Metformin, Glucophage, Glucovance, Avandamet, Riomet, Fortamet, Actoplus Met, Janumet, Glumetza or Metaglip. The above medications must be held the day of the exam AND 48 hours after the exam.  The purpose of you drinking the oral contrast is to aid in the visualization of your intestinal tract. The contrast solution may cause some diarrhea. Before your exam is started, you will be given a small amount of fluid to drink. Depending on your individual set of symptoms, you may also receive an intravenous injection of x-ray contrast/dye. Plan on being at [facility] for 30 minutes or long, depending on the type of exam you are having performed.  If you have any questions regarding your exam or if you need to reschedule, you may call the CT department at [phone] between the  hours of 8:00 am and 5:00 pm, Monday-Friday.  ________________________________________________________________________

## 2015-04-09 NOTE — Progress Notes (Signed)
Patient ID: [name], female   DOB: [birth year], 69 y.o.   MRN: [id number]   Subjective:    Patient ID: [name], female    DOB: 23-May-1946, 69 y.o.   MRN: [id number]  HPI [name] is a pleasant 69 year old white female known to [name] who was last seen in our office in 2014. She has history of coronary artery disease and a severe ischemic cardiomyopathy with EF of 10-15% most recently documented on echo in August 2016. She has an ICD in place, history of hypertension, adenomatous colon polyps and GERD. She last had EGD in 2011 which was a normal exam and had colonoscopy in June 2012 for follow-up of adenomatous polyps. This was a negative exam and she was recommended for 5 year follow-up. Upper abdominal ultrasound in 2014 was negative. She had been seen once in 2014 at that point symptoms felt consistent with IBS and she was given an anti-spasmodic and align. She comes in today with no complaints of intermittent upper abdominal pain over the past 3 months progressive and worse over the past 1 month. She says that this is frequently waking her at night and/or keeping her up at night. She is not having any postprandial pain but says it tends to bother her a lot at night. She describes this as a gnawing and sometimes sharp pain across the upper abdomen. She continues chronic problems with constipation and uses MiraLAX which she finds helpful, and no complaints of heartburn or indigestion but remains on Nexium 40 mg daily. No dysphagia no vomiting has had some mild nausea. No regular NSAIDs and is on only a baby aspirin.  Review of Systems.Pertinent positive and negative review of systems were noted in the above HPI section.  All other review of systems was otherwise negative.  Outpatient Encounter Prescriptions as of 04/09/2015  Medication Sig  . albuterol (PROAIR HFA) 108 (90 BASE) MCG/ACT inhaler Inhale 2 puffs into the lungs every 6 (six) hours as needed. For shortness of breath  .  amitriptyline (ELAVIL) 50 MG tablet Take 50 mg by mouth at bedtime.  Marland Kitchen aspirin 81 MG tablet Take 81 mg by mouth every morning.   . bisoprolol (ZEBETA) 5 MG tablet TAKE 1 TABLET BY MOUTH DAILY.  Marland Kitchen CRESTOR 20 MG tablet TAKE 1 TABLET BY MOUTH AT BEDTIME.  Marland Kitchen esomeprazole (NEXIUM) 40 MG capsule Take 1 capsule (40 mg total) by mouth daily.  Marland Kitchen losartan (COZAAR) 25 MG tablet Take 1 tablet by mouth daily.  . polyethylene glycol powder (GLYCOLAX/MIRALAX) powder   . topiramate (TOPAMAX) 50 MG tablet Take 1 tablet (50 mg total) by mouth daily.  . [DISCONTINUED] losartan (COZAAR) 50 MG tablet Take 1 tablet (50 mg total) by mouth daily.   No facility-administered encounter medications on file as of 04/09/2015.   Allergies  Allergen Reactions  . Lisinopril     cough  . Sulfa Antibiotics   . Sulfonamide Derivatives Other (See Comments)    UNSURE   Patient Active Problem List   Diagnosis Date Noted  . Headache(784.0) 01/03/2013  . 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 Cough 04/11/2012  . Sweating 03/17/2012  . ICD-St.Jude 08/06/2011  . Chronic systolic heart failure Q000111Q  . Cardiomyopathy, ischemic 06/12/2011  . CAD (coronary artery disease) 06/12/2011  . Ischemic cardiomyopathy 05/16/2011  . DDD (degenerative disc disease), lumbar 05/16/2011  . History of pulmonary embolus (PE) 01/02/2011  . UNSPECIFIED ANEMIA 07/03/2010  . HYPERTENSION 09/05/2009  . Shortness of breath 08/30/2009  .  DIVERTICULOSIS-COLON 08/14/2009  . ABDOMINAL PAIN, LEFT LOWER QUADRANT 08/14/2009  . ABDOMINAL PAIN, GENERALIZED 08/14/2009  . HYPERLIPIDEMIA 04/24/2009  . Acute myocardial infarction, unspecified site, episode of care unspecified 04/24/2009  . CORONARY ARTERY DISEASE 04/24/2009  . NAUSEA 04/24/2009  . DYSPHAGIA UNSPECIFIED 04/24/2009  . PERSONAL HX COLONIC POLYPS 04/24/2009  . TOBACCO ABUSE 04/04/2009  . HYPERTENSION, BENIGN 04/04/2009  . Coronary atherosclerosis of native coronary artery  04/04/2009  . LEFT VENTRICULAR FUNCTION, DECREASED 04/04/2009  . CHEST PAIN 04/04/2009  . DYSPEPSIA 12/04/2008  . MASTOIDITIS 10/19/2007  . GERD 10/19/2007  . CONSTIPATION 10/19/2007  . IBS 10/19/2007  . COLONIC POLYPS, ADENOMATOUS 05/12/2007   Social History   Social History  . Marital Status: Divorced    Spouse Name: N/A  . Number of Children: 2  . Years of Education: 11   Occupational History  . Unemployed   .     Social History Main Topics  . Smoking status: Former Smoker -- 0.25 packs/day    Types: Cigarettes    Quit date: 04/27/2009  . Smokeless tobacco: Never Used  . Alcohol Use: No  . Drug Use: No  . Sexual Activity: Not on file   Other Topics Concern  . Not on file   Social History Narrative   Pt lives in [place] alone.  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   Patient has 11 th grade education.Right handed.   Caffeine- one cup daily          Ms. Withers's family history includes Bladder Cancer in her brother; Breast cancer in her cousin; Colon cancer in her brother and mother.      Objective:    Filed Vitals:   04/09/15 0955  BP: 110/62  Pulse: 60    Physical Exam  well-developed older white female in no acute distress, pleasant blood pressure 110/62 pulse 60 height 5 foot 2 weight 159, BMI 28.9. HEENT; nontraumatic normocephalic EOMI PERRLA sclera anicteric, Cardiovascular; regular rate and rhythm with Q000111Q soft systolic murmur, ICD in the chest wall pulmonary clear bilaterally, Abdomen ;soft bowel sounds are present she is rather diffusely tender across her upper abdomen there is no guarding or rebound no palpable mass or hepatosplenomegaly no definite fluid wave, Rectal ;exam not done, Ext; no clubbing cyanosis or edema skin warm and dry, Neuropsych; mood and affect appropriate       Assessment & Plan:   #1 69 yo female with severe ischemic cardiomyopathy (with EF10-15%)- with 2-3 month hx of intermittent upper abdominal pain  radiating across abdomen, and frequently keeping her awake at night, associated with vague nausea  Etiology is not clear- not typical for mesenteric insufficiency but she is certainly at risk R/O gallbladder disease, pancreatic disease . Doubt PUD but cannot r/o  #2 hx of adenomatous polyps- last colon 2012 negative- not a candidate for further colonoscopies with severe ischemic cardiomyopathy- will need to consider virtual colonoscopy in future #3 GERD  Plan; CBC,CMET,lipase  Schedule for Ct abd/pelvis with contrast if creat acceptable  Continue Nexium 40 mg po qam  Further plans pending findings of above  Ason Heslin S Esabella Stockinger PA-C 04/09/2015   Cc: Leamon Arnt, MD

## 2015-04-12 ENCOUNTER — Ambulatory Visit (INDEPENDENT_AMBULATORY_CARE_PROVIDER_SITE_OTHER)
Admission: RE | Admit: 2015-04-12 | Discharge: 2015-04-12 | Disposition: A | Discharge status: Home / Self Care | Payer: Medicare Other | Source: Ambulatory Visit | Attending: Physician Assistant | Admitting: Physician Assistant

## 2015-04-12 DIAGNOSIS — R110 Nausea: Secondary | ICD-10-CM | POA: Diagnosis not present

## 2015-04-12 DIAGNOSIS — I255 Ischemic cardiomyopathy: Secondary | ICD-10-CM

## 2015-04-12 DIAGNOSIS — R1013 Epigastric pain: Secondary | ICD-10-CM | POA: Diagnosis not present

## 2015-04-12 DIAGNOSIS — R1011 Right upper quadrant pain: Secondary | ICD-10-CM

## 2015-04-12 DIAGNOSIS — R1012 Left upper quadrant pain: Secondary | ICD-10-CM

## 2015-04-13 ENCOUNTER — Other Ambulatory Visit: Payer: Self-pay

## 2015-04-13 DIAGNOSIS — R1013 Epigastric pain: Secondary | ICD-10-CM

## 2015-04-13 MED ORDER — SUCRALFATE 1 G PO TABS: ORAL_TABLET | ORAL | Status: DC

## 2015-04-16 NOTE — Progress Notes (Signed)
Reviewed and agree with management. Robert D. Kaplan, M.D., FACG  

## 2015-04-24 ENCOUNTER — Encounter: Payer: Self-pay | Admitting: Nurse Practitioner

## 2015-04-24 NOTE — Progress Notes (Signed)
Electrophysiology Office Note Date: 04/25/2015  ID:  [name], DOB 10-03-1945, MRN [id number]  PCP: Leamon Arnt, MD Primary Cardiologist: Burt Knack Electrophysiologist: Allred  CC: Routine ICD follow-up  [name] is a 69 y.o. female seen today for Dr Rayann Heman.  She presents today for routine electrophysiology followup.  Since last being seen in our clinic, the patient reports doing reasonably well.  She was having increased shortness of breath and orthopnea when last seen by Dr Prescott Gum and was referred to the AHF clinic for further evaluation.  [name] was recommended but she was told that her insurance would not cover.  PFT's were stable. Updated echo demonstrated deterioration of LV function to 10-15%. It was felt with memory problems that she was not a candidate for advanced therapies. She has stable dyspnea on exertion and orthopnea but denies chest pain, palpitations, PND, nausea, vomiting, dizziness, syncope, edema, weight gain, or early satiety.  She has not had ICD shocks.   Device History: STJ single chamber ICD implanted 2013 for ICM (Analyze ST patient) History of appropriate therapy: No History of AAD therapy: No   Past Medical History  Diagnosis Date  . CAD (coronary artery disease) 02/2009    S/P anterior MI  . Hyperlipemia   . Benign neoplasm of colon   . Unspecified mastoiditis   . Irritable bowel syndrome   . Hypertension   . Diverticulosis   . Colon polyp   . Ischemic cardiomyopathy     EF 25%  . Chronic systolic dysfunction of left ventricle   . Pulmonary embolism   . Chronic renal insufficiency   . Myocardial infarct   . Asthma   . Headache(784.0)   . Memory loss    Past Surgical History  Procedure Laterality Date  . Total abdominal hysterectomy    . Bilateral salpingoophorectomy    . Inner ear surgery      left x 17  . Cervical spine surgery  08/08  . Lumbar disc surgery  08/09  . Cad( bare metal stent)  8/10  . Angioplasty   07/02/09, 04/01/10  . Implantable cardioverter defibrillator implant N/A 08/05/2011    Primary prevention SJM ICD implanted,  Analyze ST study patient    Current Outpatient Prescriptions  Medication Sig Dispense Refill  . albuterol (PROAIR HFA) 108 (90 BASE) MCG/ACT inhaler Inhale 2 puffs into the lungs every 6 (six) hours as needed. For shortness of breath    . amitriptyline (ELAVIL) 50 MG tablet Take 50 mg by mouth at bedtime.    Marland Kitchen aspirin 81 MG tablet Take 81 mg by mouth every morning.     . bisoprolol (ZEBETA) 5 MG tablet TAKE 1 TABLET BY MOUTH DAILY. 90 tablet 3  . CRESTOR 20 MG tablet TAKE 1 TABLET BY MOUTH AT BEDTIME. 30 tablet 6  . esomeprazole (NEXIUM) 40 MG capsule Take 1 capsule (40 mg total) by mouth daily.    Marland Kitchen losartan (COZAAR) 25 MG tablet Take 1 tablet by mouth daily.    . polyethylene glycol powder (GLYCOLAX/MIRALAX) powder Take by mouth as directed.   1  . sucralfate (CARAFATE) 1 G tablet Take 1 g by mouth 3 (three) times daily before meals.    . topiramate (TOPAMAX) 50 MG tablet Take 1 tablet (50 mg total) by mouth daily. 180 tablet 3   No current facility-administered medications for this visit.    Allergies:   Lisinopril; Sulfa antibiotics; and Sulfonamide derivatives   Social History: Social History  Social History  . Marital Status: Divorced    Spouse Name: N/A  . Number of Children: 2  . Years of Education: 11   Occupational History  . Unemployed   .     Social History Main Topics  . Smoking status: Former Smoker -- 0.25 packs/day    Types: Cigarettes    Quit date: 04/27/2009  . Smokeless tobacco: Never Used  . Alcohol Use: No  . Drug Use: No  . Sexual Activity: Not on file   Other Topics Concern  . Not on file   Social History Narrative   Pt lives in [place] alone.  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   Patient has 11 th grade education.Right handed.   Caffeine- one cup daily          Family History: Family History  Problem  Relation Age of Onset  . Colon cancer Mother   . Colon cancer Brother   . Bladder Cancer Brother   . Breast cancer Cousin     Review of Systems: All other systems reviewed and are otherwise negative except as noted above.   Physical Exam: VS:  BP 122/68 mmHg  Pulse 73  Ht 5\' 2"  (1.575 m)  Wt 160 lb 9.6 oz (72.848 kg)  BMI 29.37 kg/m2 , BMI Body mass index is 29.37 kg/(m^2).  GEN- The patient is well appearing, alert and oriented x 3 today.   HEENT: normocephalic, atraumatic; sclera clear, conjunctiva pink; hearing intact; oropharynx clear; neck supple  Lungs- Clear to ausculation bilaterally, normal work of breathing.  No wheezes, rales, rhonchi Heart- Regular rate and rhythm  GI- soft, non-tender, non-distended, bowel sounds present  Extremities- no clubbing, cyanosis, or edema; DP/PT/radial pulses 1+ bilaterally MS- no significant deformity or atrophy Skin- warm and dry, no rash or lesion; ICD pocket well healed Psych- euthymic mood, full affect Neuro- strength and sensation are intact  ICD interrogation- reviewed in detail today,  See PACEART report  EKG:  EKG is ordered today. The ekg ordered today shows sinus rhythm, rate 73, LAFB, normal intervals  Recent Labs: 04/09/2015: ALT 10; BUN 26*; Creatinine, Ser 1.41*; Hemoglobin 12.3; Platelets 187.0; Potassium 4.5; Sodium 139   Wt Readings from Last 3 Encounters:  04/25/15 160 lb 9.6 oz (72.848 kg)  04/09/15 159 lb 6 oz (72.292 kg)  03/08/15 159 lb (72.122 kg)     Other studies Reviewed: Additional studies/ records that were reviewed today include: Dr Rayann Heman and Dr Antionette Char office notes  Assessment and Plan:  1.  Chronic systolic dysfunction euvolemic today Stable on an appropriate medical regimen Normal ICD function See Pace Art report No changes today  2.  ICM/CAD No recent ischemic symptoms Continue medical therapy  3.  CKD, stage III Stable by recent BMET   Current medicines are reviewed at length  with the patient today.   The patient does not have concerns regarding her medicines.  The following changes were made today:  none  Labs/ tests ordered today include:  Orders Placed This Encounter  Procedures  . EKG 12-Lead     Disposition:   Follow up with Dr Burt Knack as scheduled (she is going to recheck on [name]), Merlin transmissions, follow up with Dr Rayann Heman 1 year     Signed, Chanetta Marshall, NP 04/25/2015 2:03 PM  [facility] [address] [place] [address] [phone] (office) [phone] (fax)

## 2015-04-25 ENCOUNTER — Encounter: Payer: Self-pay | Admitting: *Deleted

## 2015-04-25 ENCOUNTER — Ambulatory Visit (INDEPENDENT_AMBULATORY_CARE_PROVIDER_SITE_OTHER): Payer: Medicare Other | Admitting: Nurse Practitioner

## 2015-04-25 ENCOUNTER — Encounter: Payer: Self-pay | Admitting: Nurse Practitioner

## 2015-04-25 VITALS — BP 122/68 | HR 73 | Ht 62.0 in | Wt 160.6 lb

## 2015-04-25 DIAGNOSIS — N183 Chronic kidney disease, stage 3 (moderate): Secondary | ICD-10-CM

## 2015-04-25 DIAGNOSIS — N1831 Chronic kidney disease, stage 3a: Secondary | ICD-10-CM

## 2015-04-25 DIAGNOSIS — Z006 Encounter for examination for normal comparison and control in clinical research program: Secondary | ICD-10-CM

## 2015-04-25 DIAGNOSIS — I255 Ischemic cardiomyopathy: Secondary | ICD-10-CM | POA: Diagnosis not present

## 2015-04-25 DIAGNOSIS — I5022 Chronic systolic (congestive) heart failure: Secondary | ICD-10-CM

## 2015-04-25 LAB — CUP PACEART INCLINIC DEVICE CHECK
Brady Statistic RV Percent Paced: 1 % — CL
HighPow Impedance: 75 Ohm
Lead Channel Impedance Value: 510 Ohm
Lead Channel Pacing Threshold Amplitude: 0.75 V
Lead Channel Pacing Threshold Pulse Width: 0.5 ms
Lead Channel Setting Pacing Amplitude: 2.5 V
Lead Channel Setting Pacing Pulse Width: 0.5 ms
Lead Channel Setting Sensing Sensitivity: 0.5 mV
MDC IDC MSMT LEADCHNL RV SENSING INTR AMPL: 12 mV
MDC IDC PG SERIAL: 1016523
MDC IDC SESS DTM: 20160928141024
MDC IDC SET ZONE DETECTION INTERVAL: 280 ms
Zone Setting Detection Interval: 340 ms

## 2015-04-25 NOTE — Patient Instructions (Addendum)
Medication Instructions:  Your physician recommends that you continue on your current medications as directed. Please refer to the Current Medication list given to you today.    Labwork:  NONE ORDER TODAY    Testing/Procedures: NONE ORDER TODAY    Follow-Up:   Remote monitoring is used to monitor your Pacemaker of ICD from home. This monitoring reduces the number of office visits required to check your device to one time per year. It allows [place] to keep an eye on the functioning of your device to ensure it is working properly. You are scheduled for a device check from home on . 12  / 29  /16      You may send your transmission at any time that day. If you have a wireless device, the transmission will be sent automatically. After your physician reviews your transmission, you will receive a postcard with your next transmission date.   Your physician wants you to follow-up in: [place] will receive a reminder letter in the mail two months in advance. If you don't receive a letter, please call our office to schedule the follow-up appointment.     Any Other Special Instructions Will Be Listed Below (If Applicable).   CHECK WITH YOUR INSURANCE FOR COVERAGE OF [place]

## 2015-04-25 NOTE — Progress Notes (Signed)
Analyze ST Final Research Visit- Pt seen in office with Luetta Nutting, NP. Device checked by industry and research. No ST alerts. No recent sustained arrhythmias. Research ST thresholds turned off per protocol. Pt released from research study [place] and transferred to [facility] for ongoing remote monitoring.Cell adapter will be mailed to pt by industry. EKG WNL reviewed by Amber, NP. BP 122/68 HR 73.

## 2015-05-01 ENCOUNTER — Ambulatory Visit (INDEPENDENT_AMBULATORY_CARE_PROVIDER_SITE_OTHER): Payer: Medicare Other | Admitting: Physician Assistant

## 2015-05-01 ENCOUNTER — Encounter: Payer: Self-pay | Admitting: Physician Assistant

## 2015-05-01 ENCOUNTER — Other Ambulatory Visit (INDEPENDENT_AMBULATORY_CARE_PROVIDER_SITE_OTHER): Payer: Medicare Other

## 2015-05-01 VITALS — BP 120/80 | HR 72 | Ht 62.0 in | Wt 158.0 lb

## 2015-05-01 DIAGNOSIS — R9389 Abnormal findings on diagnostic imaging of other specified body structures: Secondary | ICD-10-CM

## 2015-05-01 DIAGNOSIS — R1013 Epigastric pain: Secondary | ICD-10-CM

## 2015-05-01 DIAGNOSIS — R938 Abnormal findings on diagnostic imaging of other specified body structures: Secondary | ICD-10-CM

## 2015-05-01 DIAGNOSIS — I255 Ischemic cardiomyopathy: Secondary | ICD-10-CM

## 2015-05-01 LAB — H. PYLORI ANTIBODY, IGG: H PYLORI IGG: NEGATIVE

## 2015-05-01 MED ORDER — SUCRALFATE 1 G PO TABS: 1.0000 g | ORAL_TABLET | Freq: Four times a day (QID) | ORAL | Status: DC

## 2015-05-01 NOTE — Progress Notes (Signed)
Patient ID: [name], female   DOB: 1945-09-30, 69 y.o.   MRN: [id number]   Subjective:    Patient ID: [name], female    DOB: 11-Jan-1946, 69 y.o.   MRN: [id number]  HPI  [name] is a 89 -year-old female known to [name] who was seen on 04/09/2015 and comes back in today for follow-up. She has history of coronary artery disease with a severe ischemic cardiomyopathy. Most recent echo didn't in August 2016 showing an EF of 10-15%. She does have an ICD in place, also with history of hypertension, adenomatous colon polyps and GERD. EGD in 2011 was normal. Colonoscopy in June 2012 for follow-up of adenomatous polyps was a negative exam. [place] She was seen a few weeks ago with complaints of upper abdominal pain, vague nausea and a gnawing sensation that was keeping her awake at night. No worsening postprandially. Tight is been fine and weight has been stable. Patient had started on Nexium 40 mg by mouth daily this was continued, labs were checked and all were unremarkable including CBC and hepatic panel and lipase.  She then had CT of the abdomen and pelvis done which showed scattered atherosclerotic plaques along the aorta. There was noted wall thickening of the distal gastric antrum, pylorus and duodenal bulb concerning for ulcer disease. Patient was asked to increase her Nexium to twice daily and added Carafate 1 g 3 times daily between meals. She was also to have an H. pylori antibody done which has not been completed as yet. She says she is a little bit better than when last seen here but is still having upper abdominal discomfort intermittently and nocturnal discomfort. Appetite has been fine-again there is no postprandial abdominal pain. No nausea or vomiting. Weight has been stable. No changes in bowel habits no melena or hematochezia.  Review of Systems Pertinent positive and negative review of systems were noted in the above HPI section.  All other review of systems was otherwise  negative.  Outpatient Encounter Prescriptions as of 05/01/2015  Medication Sig  . albuterol (PROAIR HFA) 108 (90 BASE) MCG/ACT inhaler Inhale 2 puffs into the lungs every 6 (six) hours as needed. For shortness of breath  . amitriptyline (ELAVIL) 50 MG tablet Take 50 mg by mouth at bedtime.  Marland Kitchen aspirin 81 MG tablet Take 81 mg by mouth every morning.   . bisoprolol (ZEBETA) 5 MG tablet TAKE 1 TABLET BY MOUTH DAILY.  Marland Kitchen CRESTOR 20 MG tablet TAKE 1 TABLET BY MOUTH AT BEDTIME.  Marland Kitchen esomeprazole (NEXIUM) 40 MG capsule Take 1 capsule (40 mg total) by mouth daily.  Marland Kitchen losartan (COZAAR) 25 MG tablet Take 1 tablet by mouth daily.  . polyethylene glycol powder (GLYCOLAX/MIRALAX) powder Take by mouth as directed.   . sucralfate (CARAFATE) 1 G tablet Take 1 tablet (1 g total) by mouth 4 (four) times daily.  Marland Kitchen topiramate (TOPAMAX) 50 MG tablet Take 1 tablet (50 mg total) by mouth daily.  . [DISCONTINUED] sucralfate (CARAFATE) 1 G tablet Take 1 g by mouth 3 (three) times daily before meals.   No facility-administered encounter medications on file as of 05/01/2015.   Allergies  Allergen Reactions  . Lisinopril     cough  . Sulfa Antibiotics     cough  . Sulfonamide Derivatives Other (See Comments)    UNSURE   Patient Active Problem List   Diagnosis Date Noted  . Headache(784.0) 01/03/2013  . Pseudoaneurysm of aortic arch ([place]) 05/05/2012  . ICD-St.Jude 08/06/2011  .  Chronic systolic heart failure ([place]) 08/05/2011  . Cardiomyopathy, ischemic 06/12/2011  . DDD (degenerative disc disease), lumbar 05/16/2011  . History of pulmonary embolus (PE) 01/02/2011  . UNSPECIFIED ANEMIA 07/03/2010  . HYPERTENSION 09/05/2009  . DIVERTICULOSIS-COLON 08/14/2009  . ABDOMINAL PAIN, LEFT LOWER QUADRANT 08/14/2009  . HYPERLIPIDEMIA 04/24/2009  . CORONARY ARTERY DISEASE 04/24/2009  . DYSPHAGIA UNSPECIFIED 04/24/2009  . PERSONAL HX COLONIC POLYPS 04/24/2009  . TOBACCO ABUSE 04/04/2009  . MASTOIDITIS 10/19/2007  .  GERD 10/19/2007  . IBS 10/19/2007  . COLONIC POLYPS, ADENOMATOUS 05/12/2007   Social History   Social History  . Marital Status: Divorced    Spouse Name: N/A  . Number of Children: 2  . Years of Education: 11   Occupational History  . Unemployed   .     Social History Main Topics  . Smoking status: Former Smoker -- 0.25 packs/day    Types: Cigarettes    Quit date: 04/27/2009  . Smokeless tobacco: Never Used  . Alcohol Use: No  . Drug Use: No  . Sexual Activity: Not on file   Other Topics Concern  . Not on file   Social History Narrative   Pt lives in [place] alone.  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   Patient has 11 th grade education.Right handed.   Caffeine- one cup daily          Ms. Som's family history includes Bladder Cancer in her brother; Breast cancer in her cousin; Colon cancer in her brother and mother.      Objective:    Filed Vitals:   05/01/15 1455  BP: 120/80  Pulse: 72    Physical Exam  well-developed white female in no acute distress, pleasant blood pressure 120/80 pulse 72 height 5 foot 2 weight 158. HEENT; nontraumatic normocephalic EOMI PERRLA sclera anicteric, Cardiovascular; regular rate and rhythm with Q000111Q soft systolic murmur, Pulmonary ;clear bilaterally, there is an ICD in the left chest wall, Abdomen ;soft tender in the epigastrium there is no guarding or rebound no palpable mass or hepatosplenomegaly bowel sounds are present, Rectal; exam not done, Ext; no clubbing cyanosis or edema skin warm and dry, Neuropsych; mood and affect appropriate       Assessment & Plan:   #1 69 yo female with  6 week hx of upper abdominal pain, and CT scan showing distal antral/pyloric and duodenal bulb  Thickening concerning for PUD. Pt with mild improvement in sxs on B ID Nexium and Carafate TID #2 severe ischemic cardiomyopathy -EF 10-15% #3 CAD #4S/P ICD #5 CKD #6 hx of colon polyps- last colon negative #7  Diverticulosis #8 pseudo aneurysm aortic arch  Plan; Continue BID Nexium  Increase Carafate to QID to include a bedtime dose Check H pylori Ab- treat if +  Pt known to Dr Deatra Ina- discussed Dr. Kelby Fam resignation with pt . Schedule for [facility] with [facility] Havery Mor[facility]  In November- Procedure discussed in detail  with pt , including increased risk of cardiopulmonary complications. Will send a note to her cardiologist Dr  Burt Knack regarding advisability of sedation in this pt with Ef 10-15%. Would hope to avoid EGD and if her sxs improve in the next few weeks on treatment  will cancel procedure.   Nerida Boivin S Hoang Reich PA-C 05/01/2015   Cc: Leamon Arnt, MD

## 2015-05-01 NOTE — Patient Instructions (Signed)
Please go to the basement level to have your labs drawn.  Continue the Nexium 40 mg ,  Take 1 capsule twice daily.   Increase the Carafate tablet to 4 tablets daily with meals and at bedtime.  We will call you regarding a date for the Hospital Endoscopy.

## 2015-05-02 NOTE — Progress Notes (Signed)
Agree with plan. If her symptoms resolve with medical therapy, may be able to avoid the procedure. If symptoms persist, she will need clearance by cardiology for the procedure. Thanks

## 2015-05-03 ENCOUNTER — Other Ambulatory Visit: Payer: Self-pay

## 2015-05-03 ENCOUNTER — Telehealth: Payer: Self-pay | Admitting: Cardiovascular Disease

## 2015-05-03 ENCOUNTER — Telehealth: Payer: Self-pay

## 2015-05-03 DIAGNOSIS — R9389 Abnormal findings on diagnostic imaging of other specified body structures: Secondary | ICD-10-CM

## 2015-05-03 DIAGNOSIS — R1013 Epigastric pain: Secondary | ICD-10-CM

## 2015-05-03 NOTE — Telephone Encounter (Signed)
ORIGINAL MESSAGE WAS   RE  FINDING OUT  STRENGTH OF  ENTRESTO PT   MAY  TRY AND   GET SOME  ASSISTANCE   WITH  MED  WILL FORWARD TO AMBER   TO REVIEW

## 2015-05-03 NOTE — Progress Notes (Deleted)
Gae Bon from the lab states [facility] lab contacted her and told her they no long check H. Pylori antibody with blood. The patient will have to have a stool test. Gae Bon already contacted the patient and will have her come in tomorrow to for the stool test.

## 2015-05-03 NOTE — Progress Notes (Signed)
Pt is scheduled for EGD with Dr Havery Moros on Nov 1st... She needs cardiology clearance prior to sedation - please be sure she gets appt with dr Burt Knack her cardiologist before Nov 1 st - she has a severe ischemic cardiomyopathy..If these abnormal clinical findings persist, appropriate workup will be completed. The patient understands that follow up is required to elucidate the situation. He cannot see her before then  Will need to cancel egd for that day.... Also please make a note to yourself to call her in a couple weeks- if her sxs improve we would cancel EGD

## 2015-05-03 NOTE — Telephone Encounter (Signed)
I would recommend reserving EGD as a last resort in this patient with severe underlying heart disease and LVEF 10%. She would be high risk sedation. If a medical necessity, I would be happy to see her and discuss risk/benefit but probably best to avoid if possible.  [name] 05/03/2015 3:38 PM

## 2015-05-03 NOTE — Telephone Encounter (Signed)
Will forward to Dr. Burt Knack to address surgical clearance.

## 2015-05-03 NOTE — Telephone Encounter (Signed)
Called patient with her lab results. She is scheduled for an EGD with Dr Havery Moros on 05/29/15 but she will need cardiac clearance prior to sedation. Patient did not seem to know this. She has a cardiology appointment in December. She states this was planned at her last cardiology visit.  I have contacted cardiology to inquire about an appointment for cardiac clearance.

## 2015-05-03 NOTE — Telephone Encounter (Signed)
PT  WANTS TO   KNOW  STRENGTH   OF MED  TO SEE IF CAN GET  SOME  ASSISTANCE  WITH  COST  WILL FORWARD TO [facility]BER  FOR REVIEW .[name]

## 2015-05-03 NOTE — Progress Notes (Signed)
Gae Bon from the lab states [facility] contacted her and told her that they no longer check H. Pylori antobody with blood. Gae Bon states she contacted the patient

## 2015-05-03 NOTE — Progress Notes (Signed)
Ignore the 2 lines in my note that don't  Make sense .Marland Kitchen The computer auto inserted that !!!

## 2015-05-03 NOTE — Telephone Encounter (Signed)
Forwarded message to [employer], PA-C. Spoke with Beth at GI office and informed her of recommendations per Dr. Burt Knack. Beth verbalized understanding and said that she would make sure Amy seen the message.

## 2015-05-03 NOTE — Telephone Encounter (Signed)
Request for surgical clearance:  1. What type of surgery is being performed? EGD  2. When is this surgery scheduled? 05/29/15  3. Are there any medications that need to be held prior to surgery and how long?   ( Can the pt use Propofol?)  4. Name of physician performing surgery? Dr. Havery Moros  5. What is your office phone and fax number?    Fax [phone]

## 2015-05-03 NOTE — Telephone Encounter (Signed)
Will defer to Dr Burt Knack. She is at very high risk for any procedure with underlying cardiomyopathy and congestive heart failure.  If absolutely necessary to do EGD, I do not think there is anything that we could do to obviate her risks.   Chanetta Marshall, NP 05/03/2015 1:51 PM

## 2015-05-03 NOTE — Telephone Encounter (Signed)
New message      Talk to the nurse regarding entresto prescription

## 2015-05-04 ENCOUNTER — Telehealth: Payer: Self-pay

## 2015-05-04 MED ORDER — SACUBITRIL-VALSARTAN 24-26 MG PO TABS: 1.0000 | ORAL_TABLET | Freq: Two times a day (BID) | ORAL | Status: DC

## 2015-05-04 NOTE — Telephone Encounter (Signed)
Entresto would start 24/26mg    Thank you

## 2015-05-04 NOTE — Telephone Encounter (Signed)
Agreed. I would like to see her after a few weeks to see how she responds to therapy. [name] can you please help coordinate.

## 2015-05-04 NOTE — Telephone Encounter (Signed)
Ok..per orders Dr Burt Knack /cardiology pt should not have an EGD. Due to severe cardiomyopathy... So, cancel EGD...she still needs an appt with Dr Burt Knack.... And get her a follow up with Dr Havery Moros in office.     Keep taking all the Gi  meds  We have her on.Marland KitchenMarland KitchenMarland Kitchen

## 2015-05-04 NOTE — Telephone Encounter (Signed)
Cardiology nurse called back. Dr Burt Knack has sent a message to Nicoletta Ba, PA.

## 2015-05-04 NOTE — Telephone Encounter (Signed)
PT  NOTIFIED WILL LEAVE  BROCHURE  AND  SCRIPT AT FRONT  ALSO INSTRUCTED  IF  IS  ABLE TO START  DRUG  TO CALL OFFICE  BEFORE TAKING  AS   WE MAY NEED  TO CHANGE  CURRENT  MEDS PT  IS ON .PT  VERBALIZED UNDERSTANDING ./

## 2015-05-04 NOTE — Telephone Encounter (Signed)
  Call Documentation      Amy Genia Harold, PA-C at 05/04/2015 2:29 PM     Status: Signed       Expand All Collapse All   Ok..per orders Dr Burt Knack /cardiology pt should not have an EGD. Due to severe cardiomyopathy... So, cancel EGD...she still needs an appt with Dr Burt Knack.... And get her a follow up with Dr Havery Moros in office. Keep taking all the Gi meds We have her on.Marland KitchenMarland KitchenMarland Kitchen

## 2015-05-04 NOTE — Telephone Encounter (Signed)
Patient is advised and scheduled

## 2015-05-07 NOTE — Telephone Encounter (Signed)
Left a message for patient to call back.(need to move up OV)

## 2015-05-07 NOTE — Telephone Encounter (Signed)
Spoke with patient and scheduled OV on 05/29/15 with Dr. Havery Moros.

## 2015-05-18 ENCOUNTER — Encounter: Payer: Self-pay | Admitting: Internal Medicine

## 2015-05-29 ENCOUNTER — Other Ambulatory Visit: Payer: Self-pay | Admitting: Nurse Practitioner

## 2015-05-29 ENCOUNTER — Ambulatory Visit (INDEPENDENT_AMBULATORY_CARE_PROVIDER_SITE_OTHER): Payer: Medicare Other | Admitting: Gastroenterology

## 2015-05-29 ENCOUNTER — Encounter: Payer: Self-pay | Admitting: Gastroenterology

## 2015-05-29 ENCOUNTER — Other Ambulatory Visit: Payer: Self-pay | Admitting: Cardiovascular Disease

## 2015-05-29 VITALS — BP 120/84 | HR 84 | Ht 62.25 in | Wt 160.1 lb

## 2015-05-29 DIAGNOSIS — R933 Abnormal findings on diagnostic imaging of other parts of digestive tract: Secondary | ICD-10-CM | POA: Diagnosis not present

## 2015-05-29 DIAGNOSIS — I255 Ischemic cardiomyopathy: Secondary | ICD-10-CM | POA: Diagnosis not present

## 2015-05-29 DIAGNOSIS — R1013 Epigastric pain: Secondary | ICD-10-CM | POA: Diagnosis not present

## 2015-05-29 NOTE — Progress Notes (Signed)
HPI :  69 y/o female, new patient to me, formerly known to [name],, here for follow up of abdominal pain.   She has history of coronary artery disease with a severe ischemic cardiomyopathy. Most recent echo in August 2016 showing an EF of 10-15%. She does have an ICD in place, also with history of hypertension, adenomatous colon polyps and GERD. EGD in 2011 was normal. Colonoscopy in June 2012 for follow-up of adenomatous polyps was a negative exam. She has had CT of the abdomen and pelvis done which showed scattered atherosclerotic plaques along the aorta. There was noted wall thickening of the distal gastric antrum, pylorus and duodenal bulb concerning for ulcer disease.  Patient has been having some abdominal pain, which she thinks has been ongoing for 2 months. Pain in the upper abdomen mostly, epigastric area. She reports the pain would come and go, not constant. She felt it daily, lasted for roughly a few hours at a time. She rpeorts she had significant discomfort at night. She is not sure if eating would make it worse, did not make it better or worse. No nausea or vomiting with it. No fevers. No blood in the stools. No weight loss. She had discussed possible EGD to evaluate these symptoms and her CT scan findings however after discussion with cardiology, they recommending holding off if possible at this time given her last EF is 10% and she is high risk for anesthesia.   She intervally increased her nexium to twice daily and added Carafate TID to her regimen, and she thinks her pain resolved. She reports she has not had discomfort in roughly 3 weeks or so. She has some occasional LLQ pain which comes and go and is mild but not bothersome. Overall she feels significantly improved. She has completed the carafate and no longer taking it. She has reduced her nexium back to once daily since her pain has improved. She had an H pylori serology drawn recently which was negative.   She has a history  of 2 MIs. She takes a small aspirin daily. She denies other NSAIDs.  Prior EGD in 2011 was normal. She has had a prior colonoscopy in 2012 which was normal. Mother had colon cancer at age 67.    Past Medical History  Diagnosis Date  . CAD (coronary artery disease) 02/2009    S/P anterior MI  . Hyperlipemia   . Benign neoplasm of colon   . Unspecified mastoiditis   . Irritable bowel syndrome   . Hypertension   . Diverticulosis   . Colon polyp   . Ischemic cardiomyopathy     EF 10-15%  . Chronic systolic dysfunction of left ventricle   . Pulmonary embolism ([place])   . Chronic renal insufficiency   . Myocardial infarct ([place])   . Asthma   . Headache(784.0)   . Memory loss      Past Surgical History  Procedure Laterality Date  . Total abdominal hysterectomy    . Bilateral salpingoophorectomy    . Inner ear surgery      left x 17  . Cervical spine surgery  08/08  . Lumbar disc surgery  08/09  . Cad( bare metal stent)  8/10  . Angioplasty  07/02/09, 04/01/10  . Implantable cardioverter defibrillator implant N/A 08/05/2011    Primary prevention SJM ICD implanted,  Analyze ST study patient   Family History  Problem Relation Age of Onset  . Colon cancer Mother   . Colon cancer Brother   .  Bladder Cancer Brother   . Breast cancer Cousin    Social History  Substance Use Topics  . Smoking status: Former Smoker -- 0.25 packs/day    Types: Cigarettes    Quit date: 04/27/2009  . Smokeless tobacco: Never Used  . Alcohol Use: No   Current Outpatient Prescriptions  Medication Sig Dispense Refill  . albuterol (PROAIR HFA) 108 (90 BASE) MCG/ACT inhaler Inhale 2 puffs into the lungs every 6 (six) hours as needed. For shortness of breath    . amitriptyline (ELAVIL) 50 MG tablet Take 50 mg by mouth at bedtime.    Marland Kitchen aspirin 81 MG tablet Take 81 mg by mouth every morning.     . bisoprolol (ZEBETA) 5 MG tablet TAKE 1 TABLET BY MOUTH DAILY. 90 tablet 3  . CRESTOR 20 MG tablet TAKE 1 TABLET  BY MOUTH AT BEDTIME. 30 tablet 6  . esomeprazole (NEXIUM) 40 MG capsule Take 1 capsule (40 mg total) by mouth daily.    Marland Kitchen losartan (COZAAR) 25 MG tablet Take 1 tablet by mouth daily.    . polyethylene glycol powder (GLYCOLAX/MIRALAX) powder Take by mouth as directed.   1  . sacubitril-valsartan (ENTRESTO) 24-26 MG Take 1 tablet by mouth 2 (two) times daily. 60 tablet 11  . sucralfate (CARAFATE) 1 G tablet Take 1 tablet (1 g total) by mouth 4 (four) times daily. 120 tablet 1  . topiramate (TOPAMAX) 50 MG tablet Take 1 tablet (50 mg total) by mouth daily. 180 tablet 3   No current facility-administered medications for this visit.   Allergies  Allergen Reactions  . Lisinopril     cough  . Sulfa Antibiotics     cough  . Sulfonamide Derivatives Other (See Comments)    UNSURE     Review of Systems: All systems reviewed and negative except where noted in HPI.   CT abdomen as above  Lab Results  Component Value Date   WBC 6.2 04/09/2015   HGB 12.3 04/09/2015   HCT 36.9 04/09/2015   MCV 98.3 04/09/2015   PLT 187.0 04/09/2015    Lab Results  Component Value Date   CREATININE 1.41* 04/09/2015   BUN 26* 04/09/2015   NA 139 04/09/2015   K 4.5 04/09/2015   CL 110 04/09/2015   CO2 23 04/09/2015    Lab Results  Component Value Date   ALT 10 04/09/2015   AST 12 04/09/2015   ALKPHOS 93 04/09/2015   BILITOT 0.4 04/09/2015     Physical Exam: BP 120/84 mmHg  Pulse 84  Ht 5' 2.25" (1.581 m)  Wt 160 lb 2 oz (72.632 kg)  BMI 29.06 kg/m2 Constitutional: Pleasant,well-developed, female in no acute distress. HEENT: Normocephalic and atraumatic. Conjunctivae are normal. No scleral icterus. Neck supple.  Cardiovascular: Normal rate, regular rhythm.  Pulmonary/chest: Effort normal and breath sounds normal. No wheezing, rales or rhonchi. Abdominal: Soft, nondistended, nontender. Bowel sounds active throughout. There are no masses palpable. No hepatomegaly. Extremities: trace to one  (+)  Edema BL LE Lymphadenopathy: No cervical adenopathy noted. Neurological: Alert and oriented to person place and time. Skin: Skin is warm and dry. No rashes noted. Psychiatric: Normal mood and affect. Behavior is normal.   ASSESSMENT AND PLAN: 69 y/o female with severe ischemic cardiomyopathy with EF of 10% who presented previously with a few months of epigastric abdominal pain. CT showed some inflammatory changes of the distal stomach and duodenal bulb concerning for PUD. She has tested negative for H pylori. She was  treated for a period of time with high dose PPI and Carafate and her symptoms resolved, and now mostly asymptomatic in this light.   Ideally we would like to evaluate CT findings with an EGD to ensure no evidence of malignancy however after hearing from cardiology regarding her pre-op clearance, they are recommending avoiding EGD at this time if possible which I agree with given her low EF. Given she is feeling better without symptoms, okay to hold off at this time, however if her cardiac condition improves in the upcoming months would consider EGD down the road. I discussed this at length with the patient who agreed with the plan. I would like to see her in 3-6 months for reassessment. She will continue nexium and if symptoms recur can contact us for reassessment. Okay to hold carafate at this time and avoid all NSAIDs other than her aspirin.   Ferndale Cellar, MD [facility] Pager [phone]

## 2015-05-29 NOTE — Patient Instructions (Signed)
We will contact you with a 3-34month follow up.

## 2015-06-09 ENCOUNTER — Encounter: Payer: Self-pay | Admitting: Family Medicine

## 2015-06-09 ENCOUNTER — Ambulatory Visit (INDEPENDENT_AMBULATORY_CARE_PROVIDER_SITE_OTHER): Payer: Medicare Other

## 2015-06-09 ENCOUNTER — Ambulatory Visit (INDEPENDENT_AMBULATORY_CARE_PROVIDER_SITE_OTHER): Payer: Medicare Other | Admitting: Family Medicine

## 2015-06-09 VITALS — BP 128/72 | HR 105 | Temp 98.8°F | Resp 16 | Ht 62.25 in | Wt 159.0 lb

## 2015-06-09 DIAGNOSIS — Y92009 Unspecified place in unspecified non-institutional (private) residence as the place of occurrence of the external cause: Secondary | ICD-10-CM

## 2015-06-09 DIAGNOSIS — M25512 Pain in left shoulder: Secondary | ICD-10-CM | POA: Diagnosis not present

## 2015-06-09 DIAGNOSIS — I255 Ischemic cardiomyopathy: Secondary | ICD-10-CM

## 2015-06-09 DIAGNOSIS — W19XXXA Unspecified fall, initial encounter: Secondary | ICD-10-CM

## 2015-06-09 DIAGNOSIS — Y92099 Unspecified place in other non-institutional residence as the place of occurrence of the external cause: Secondary | ICD-10-CM

## 2015-06-09 NOTE — Progress Notes (Signed)
Patient ID: [name], female    DOB: 09/14/1945  Age: 69 y.o. MRN: [id number]  Chief Complaint  Patient presents with  . Shoulder Injury    [place] [address] last night, injury to left shoulder    Subjective:   Patient had dropped a bottle syrup in the pantry. When she went to get it she did not realize it had leaked and she slipped on the serpent fell landing on her left shoulder. That was last night. She has continued to hurt quite a lot. Hurts to move it at all. No prior shoulder problems. This is her first visit here.  Current allergies, medications, problem list, past/family and social histories reviewed.  Objective:  BP 128/72 mmHg  Pulse 105  Temp(Src) 98.8 F (37.1 C) (Oral)  Resp 16  Ht 5' 2.25" (1.581 m)  Wt 159 lb (72.122 kg)  BMI 28.85 kg/m2  SpO2 98%  Very tender anterior aspect of left shoulder and proximal humerus area. Neck and trapezius normal. Clavicle normal. Forearm normal. Grip good. Passive range of motion causes pain with minimal motion.  Assessment & Plan:   Assessment: 1. Pain in joint of left shoulder   2. Fall at home, initial encounter       Plan: X-ray left shoulder  Orders Placed This Encounter  Procedures  . DG Shoulder Left    Order Specific Question:  Reason for Exam (SYMPTOM  OR DIAGNOSIS REQUIRED)    Answer:  fell on left shoulder, pain    Order Specific Question:  Preferred imaging location?    Answer:  External   [facility] reading (PRIMARY) by  Dr. Linna Darner  Normal shoulder.    Patient Instructions  Wear sling  Do gentle stretching exercises of the shoulder by bending over and swinging her arm as I instructed you, about 5 or 6 times daily  Use an ice pack on the shoulder for 15-20 minutes 5 or 6 times daily  Take over-the-counter ibuprofen 200 mg 4 pills 3 times daily (800 mg 3 times daily) take hydrocodone 5/325 one every 6 hours if needed for more severe pain  Plan to return in 4 or 5 days for a recheck unless remarkably better  before then  Return at anytime if worse  This may well represent a rotator cuff injury. Read the following. If it is not improving in the next 4- 5 days when we recheck you, we may need to send you to an orthopedic doctor.  Rotator Cuff Injury Rotator cuff injury is any type of injury to the set of muscles and tendons that make up the stabilizing unit of your shoulder. This unit holds the ball of your upper arm bone (humerus) in the socket of your shoulder blade (scapula).  CAUSES Injuries to your rotator cuff most commonly come from sports or activities that cause your arm to be moved repeatedly over your head. Examples of this include throwing, weight lifting, swimming, or racquet sports. Long lasting (chronic) irritation of your rotator cuff can cause soreness and swelling (inflammation), bursitis, and eventual damage to your tendons, such as a tear (rupture). SIGNS AND SYMPTOMS Acute rotator cuff tear:  Sudden tearing sensation followed by severe pain shooting from your upper shoulder down your arm toward your elbow.  Decreased range of motion of your shoulder because of pain and muscle spasm.  Severe pain.  Inability to raise your arm out to the side because of pain and loss of muscle power (large tears). Chronic rotator cuff tear:  Pain  that usually is worse at night and may interfere with sleep.  Gradual weakness and decreased shoulder motion as the pain worsens.  Decreased range of motion. Rotator cuff tendinitis:  Deep ache in your shoulder and the outside upper arm over your shoulder.  Pain that comes on gradually and becomes worse when lifting your arm to the side or turning it inward. DIAGNOSIS Rotator cuff injury is diagnosed through a medical history, physical exam, and imaging exam. The medical history helps determine the type of rotator cuff injury. Your health care provider will look at your injured shoulder, feel the injured area, and ask you to move your shoulder  in different positions. X-ray exams typically are done to rule out other causes of shoulder pain, such as fractures. MRI is the exam of choice for the most severe shoulder injuries because the images show muscles and tendons.  TREATMENT  Chronic tear:  Medicine for pain, such as acetaminophen or ibuprofen.  Physical therapy and range-of-motion exercises may be helpful in maintaining shoulder function and strength.  Steroid injections into your shoulder joint.  Surgical repair of the rotator cuff if the injury does not heal with noninvasive treatment. Acute tear:  Anti-inflammatory medicines such as ibuprofen and naproxen to help reduce pain and swelling.  A sling to help support your arm and rest your rotator cuff muscles. Long-term use of a sling is not advised. It may cause significant stiffening of the shoulder joint.  Surgery may be considered within a few weeks, especially in younger, active people, to return the shoulder to full function.  Indications for surgical treatment include the following:  Age younger than 64 years.  Rotator cuff tears that are complete.  Physical therapy, rest, and anti-inflammatory medicines have been used for 6-8 weeks, with no improvement.  Employment or sporting activity that requires constant shoulder use. Tendinitis:  Anti-inflammatory medicines such as ibuprofen and naproxen to help reduce pain and swelling.  A sling to help support your arm and rest your rotator cuff muscles. Long-term use of a sling is not advised. It may cause significant stiffening of the shoulder joint.  Severe tendinitis may require:  Steroid injections into your shoulder joint.  Physical therapy.  Surgery. HOME CARE INSTRUCTIONS   Apply ice to your injury:  Put ice in a plastic bag.  Place a towel between your skin and the bag.  Leave the ice on for 20 minutes, 2-3 times a day.  If you have a shoulder immobilizer (sling and straps), wear it until told  otherwise by your health care provider.  You may want to sleep on several pillows or in a recliner at night to lessen swelling and pain.  Only take over-the-counter or prescription medicines for pain, discomfort, or fever as directed by your health care provider.  Do simple hand squeezing exercises with a soft rubber ball to decrease hand swelling. SEEK MEDICAL CARE IF:   Your shoulder pain increases, or new pain or numbness develops in your arm, hand, or fingers.  Your hand or fingers are colder than your other hand. SEEK IMMEDIATE MEDICAL CARE IF:   Your arm, hand, or fingers are numb or tingling.  Your arm, hand, or fingers are increasingly swollen and painful, or they turn white or blue. MAKE SURE YOU:  Understand these instructions.  Will watch your condition.  Will get help right away if you are not doing well or get worse.   This information is not intended to replace advice given to you  by your health care provider. Make sure you discuss any questions you have with your health care provider.   Document Released: 07/11/2000 Document Revised: 07/19/2013 Document Reviewed: 02/23/2013 Elsevier Interactive Patient Education [employer].      Return in about 5 days (around 06/14/2015).   HOPPER,DAVID, MD 06/09/2015

## 2015-06-09 NOTE — Patient Instructions (Signed)
Wear sling  Do gentle stretching exercises of the shoulder by bending over and swinging her arm as I instructed you, about 5 or 6 times daily  Use an ice pack on the shoulder for 15-20 minutes 5 or 6 times daily  Take over-the-counter ibuprofen 200 mg 4 pills 3 times daily (800 mg 3 times daily) take hydrocodone 5/325 one every 6 hours if needed for more severe pain  Plan to return in 4 or 5 days for a recheck unless remarkably better before then  Return at anytime if worse  This may well represent a rotator cuff injury. Read the following. If it is not improving in the next 4- 5 days when we recheck you, we may need to send you to an orthopedic doctor.  Rotator Cuff Injury Rotator cuff injury is any type of injury to the set of muscles and tendons that make up the stabilizing unit of your shoulder. This unit holds the ball of your upper arm bone (humerus) in the socket of your shoulder blade (scapula).  CAUSES Injuries to your rotator cuff most commonly come from sports or activities that cause your arm to be moved repeatedly over your head. Examples of this include throwing, weight lifting, swimming, or racquet sports. Long lasting (chronic) irritation of your rotator cuff can cause soreness and swelling (inflammation), bursitis, and eventual damage to your tendons, such as a tear (rupture). SIGNS AND SYMPTOMS Acute rotator cuff tear:  Sudden tearing sensation followed by severe pain shooting from your upper shoulder down your arm toward your elbow.  Decreased range of motion of your shoulder because of pain and muscle spasm.  Severe pain.  Inability to raise your arm out to the side because of pain and loss of muscle power (large tears). Chronic rotator cuff tear:  Pain that usually is worse at night and may interfere with sleep.  Gradual weakness and decreased shoulder motion as the pain worsens.  Decreased range of motion. Rotator cuff tendinitis:  Deep ache in your  shoulder and the outside upper arm over your shoulder.  Pain that comes on gradually and becomes worse when lifting your arm to the side or turning it inward. DIAGNOSIS Rotator cuff injury is diagnosed through a medical history, physical exam, and imaging exam. The medical history helps determine the type of rotator cuff injury. Your health care provider will look at your injured shoulder, feel the injured area, and ask you to move your shoulder in different positions. X-ray exams typically are done to rule out other causes of shoulder pain, such as fractures. MRI is the exam of choice for the most severe shoulder injuries because the images show muscles and tendons.  TREATMENT  Chronic tear:  Medicine for pain, such as acetaminophen or ibuprofen.  Physical therapy and range-of-motion exercises may be helpful in maintaining shoulder function and strength.  Steroid injections into your shoulder joint.  Surgical repair of the rotator cuff if the injury does not heal with noninvasive treatment. Acute tear:  Anti-inflammatory medicines such as ibuprofen and naproxen to help reduce pain and swelling.  A sling to help support your arm and rest your rotator cuff muscles. Long-term use of a sling is not advised. It may cause significant stiffening of the shoulder joint.  Surgery may be considered within a few weeks, especially in younger, active people, to return the shoulder to full function.  Indications for surgical treatment include the following:  Age younger than 10 years.  Rotator cuff tears that are  complete.  Physical therapy, rest, and anti-inflammatory medicines have been used for 6-8 weeks, with no improvement.  Employment or sporting activity that requires constant shoulder use. Tendinitis:  Anti-inflammatory medicines such as ibuprofen and naproxen to help reduce pain and swelling.  A sling to help support your arm and rest your rotator cuff muscles. Long-term use of a sling  is not advised. It may cause significant stiffening of the shoulder joint.  Severe tendinitis may require:  Steroid injections into your shoulder joint.  Physical therapy.  Surgery. HOME CARE INSTRUCTIONS   Apply ice to your injury:  Put ice in a plastic bag.  Place a towel between your skin and the bag.  Leave the ice on for 20 minutes, 2-3 times a day.  If you have a shoulder immobilizer (sling and straps), wear it until told otherwise by your health care provider.  You may want to sleep on several pillows or in a recliner at night to lessen swelling and pain.  Only take over-the-counter or prescription medicines for pain, discomfort, or fever as directed by your health care provider.  Do simple hand squeezing exercises with a soft rubber ball to decrease hand swelling. SEEK MEDICAL CARE IF:   Your shoulder pain increases, or new pain or numbness develops in your arm, hand, or fingers.  Your hand or fingers are colder than your other hand. SEEK IMMEDIATE MEDICAL CARE IF:   Your arm, hand, or fingers are numb or tingling.  Your arm, hand, or fingers are increasingly swollen and painful, or they turn white or blue. MAKE SURE YOU:  Understand these instructions.  Will watch your condition.  Will get help right away if you are not doing well or get worse.   This information is not intended to replace advice given to you by your health care provider. Make sure you discuss any questions you have with your health care provider.   Document Released: 07/11/2000 Document Revised: 07/19/2013 Document Reviewed: 02/23/2013 Elsevier Interactive Patient Education [employer].

## 2015-06-19 ENCOUNTER — Other Ambulatory Visit: Payer: Self-pay | Admitting: Orthopedic Surgery

## 2015-06-19 DIAGNOSIS — M25511 Pain in right shoulder: Secondary | ICD-10-CM

## 2015-07-05 ENCOUNTER — Ambulatory Visit
Admission: RE | Admit: 2015-07-05 | Discharge: 2015-07-05 | Disposition: A | Discharge status: Home / Self Care | Payer: Medicare Other | Source: Ambulatory Visit | Attending: Orthopedic Surgery | Admitting: Orthopedic Surgery

## 2015-07-05 ENCOUNTER — Other Ambulatory Visit: Payer: Self-pay | Admitting: Orthopedic Surgery

## 2015-07-05 DIAGNOSIS — M25511 Pain in right shoulder: Secondary | ICD-10-CM

## 2015-07-05 MED ORDER — IOHEXOL 180 MG/ML  SOLN
15.0000 mL | Freq: Once | INTRAMUSCULAR | Status: AC | PRN
  Administered 2015-07-05: 15 mL via INTRA_ARTICULAR

## 2015-07-16 ENCOUNTER — Ambulatory Visit (INDEPENDENT_AMBULATORY_CARE_PROVIDER_SITE_OTHER): Payer: Medicare Other | Admitting: Cardiovascular Disease

## 2015-07-16 ENCOUNTER — Encounter: Payer: Self-pay | Admitting: Cardiovascular Disease

## 2015-07-16 VITALS — BP 130/82 | HR 73 | Ht 62.5 in | Wt 160.8 lb

## 2015-07-16 DIAGNOSIS — I255 Ischemic cardiomyopathy: Secondary | ICD-10-CM | POA: Diagnosis not present

## 2015-07-16 DIAGNOSIS — I5022 Chronic systolic (congestive) heart failure: Secondary | ICD-10-CM

## 2015-07-16 NOTE — Progress Notes (Signed)
Cardiology Office Note Date:  07/18/2015   ID:  [name], DOB [birth year], MRN [id number]  PCP:  Tammy Arnt, MD  Cardiologist:  Tammy Mocha, MD    Chief Complaint  Patient presents with  . Shortness of Breath   History of Present Illness: [name] is a 69 y.o. female who presents for follow-up evaluation. The patient has a history of coronary disease with anterior wall MI. She had stent thrombosis in 2011. She's had severe residual left ventricular systolic dysfunction and has undergone ICD implantation. She's been maintained only on low-dose aspirin because of GI bleeding on dual antiplatelet therapy. She was seen by Dr [name] in the [facility] who felt she was not a candidate for advanced therapies because of memory problems and COPD. [name] was recommended but she hasn't been able to obtain the medicine because of insurance coverage. She has changed insurance and will be able to get [facility] after Jan 1st. Also has been followed for an aortic pseudoaneurysm of the distal arch by Dr [name] - this is felt to be stable by serial CT imaging and the patient has been asymptomatic in that regard.   The patient reports stable dyspnea with exertion, occurring with any moderate level activity. She has a previous smoking history and also has COPD. No recent chest pain or pressure. Other complaints include dizziness, orthopnea.    Past Medical History  Diagnosis Date  . CAD (coronary artery disease) 02/2009    S/P anterior MI  . Hyperlipemia   . Benign neoplasm of colon   . Unspecified mastoiditis   . Irritable bowel syndrome   . Hypertension   . Diverticulosis   . Colon polyp   . Ischemic cardiomyopathy     EF 10-15%  . Chronic systolic dysfunction of left ventricle   . Pulmonary embolism ([place])   . Chronic renal insufficiency   . Myocardial infarct ([place])   . Asthma   . Headache(784.0)   . Memory loss     Past Surgical History    Procedure Laterality Date  . Total abdominal hysterectomy    . Bilateral salpingoophorectomy    . Inner ear surgery      left x 17  . Cervical spine surgery  08/08  . Lumbar disc surgery  08/09  . Cad( bare metal stent)  8/10  . Angioplasty  07/02/09, 04/01/10  . Implantable cardioverter defibrillator implant N/A 08/05/2011    Primary prevention SJM ICD implanted,  Analyze ST study patient    Current Outpatient Prescriptions  Medication Sig Dispense Refill  . albuterol (PROAIR HFA) 108 (90 BASE) MCG/ACT inhaler Inhale 2 puffs into the lungs every 6 (six) hours as needed. For shortness of breath    . amitriptyline (ELAVIL) 50 MG tablet Take 50 mg by mouth at bedtime.    Marland Kitchen aspirin 81 MG tablet Take 81 mg by mouth every morning.     . bisoprolol (ZEBETA) 5 MG tablet TAKE 1 TABLET BY MOUTH DAILY. 90 tablet 3  . CRESTOR 20 MG tablet TAKE 1 TABLET BY MOUTH AT BEDTIME. 30 tablet 11  . esomeprazole (NEXIUM) 40 MG capsule Take 1 capsule (40 mg total) by mouth daily.    Marland Kitchen losartan (COZAAR) 25 MG tablet TAKE 1 TABLET (25 MG TOTAL) BY MOUTH DAILY. 30 tablet 11  . polyethylene glycol powder (GLYCOLAX/MIRALAX) powder Take by mouth as directed.   1  . sucralfate (CARAFATE) 1 G tablet Take 1 tablet (1 g  total) by mouth 4 (four) times daily. 120 tablet 1  . topiramate (TOPAMAX) 50 MG tablet Take 1 tablet (50 mg total) by mouth daily. 180 tablet 3   No current facility-administered medications for this visit.    Allergies:   Lisinopril; Sulfa antibiotics; and Sulfonamide derivatives   Social History:  The patient  reports that she quit smoking about 6 years ago. Her smoking use included Cigarettes. She smoked 0.25 packs per day. She has never used smokeless tobacco. She reports that she does not drink alcohol or use illicit drugs.   Family History:  The patient's  family history includes Bladder Cancer in her brother; Breast cancer in her cousin; Colon cancer in her brother and mother.    ROS:  Please  see the history of present illness.  Otherwise, review of systems is positive for hearing loss, abdominal pain, easy bruising, constipation.  All other systems are reviewed and negative.    PHYSICAL EXAM: VS:  BP 130/82 mmHg  Pulse 73  Ht 5' 2.5" (1.588 m)  Wt 160 lb 12.8 oz (72.938 kg)  BMI 28.92 kg/m2  SpO2 98% , BMI Body mass index is 28.92 kg/(m^2). GEN: Well nourished, well developed, in no acute distress HEENT: normal Neck: no JVD, no masses. No carotid bruits Cardiac: RRR without murmur or gallop                Respiratory:  clear to auscultation bilaterally, normal work of breathing GI: soft, nontender, nondistended, + BS MS: no deformity or atrophy Ext: no pretibial edema Skin: warm and dry, no rash Neuro:  Strength and sensation are intact Psych: euthymic mood, full affect  EKG:  EKG is not ordered today.  Recent Labs: 04/09/2015: ALT 10; BUN 26*; Creatinine, Ser 1.41*; Hemoglobin 12.3; Platelets 187.0; Potassium 4.5; Sodium 139   Lipid Panel     Component Value Date/Time   CHOL 139 01/27/2013 0857   TRIG 164.0* 01/27/2013 0857   HDL 44.90 01/27/2013 0857   CHOLHDL 3 01/27/2013 0857   VLDL 32.8 01/27/2013 0857   LDLCALC 61 01/27/2013 0857      Wt Readings from Last 3 Encounters:  07/16/15 160 lb 12.8 oz (72.938 kg)  06/09/15 159 lb (72.122 kg)  05/29/15 160 lb 2 oz (72.632 kg)     Cardiac Studies Reviewed: 2D Echo 03/13/2015: Study Conclusions  - Left ventricle: The cavity size was moderately dilated. Wall thickness was normal. Systolic function was severely reduced. The estimated ejection fraction was in the range of 10% to 15%. Severe hypokinesis of the anteroseptal myocardium. Hypokinesis of the inferoseptal myocardium. Dyskinesis of the apical myocardium. Hypokinesis of the inferior myocardium. Akinesis of the anterior myocardium. Doppler parameters are consistent with abnormal left ventricular relaxation (grade 1 diastolic  dysfunction). Doppler parameters are consistent with high ventricular filling pressure. - Mitral valve: There was trivial regurgitation.  ASSESSMENT AND PLAN: 1.   Chronic systolic heart failure, New York Heart Association functional class III: plan to switch to [facility] in January. Instructions given. May add Spironolactone in future as tolerated. Otherwise continue current medical therapy.  2. Coronary artery disease, native vessel, with old MI: no recurrence of angina.   3. Severe ischemic cardiomyopathy , status post ICD: followed by EP team. No ICD discharges.   4. Thoracic aortic pseudoaneurysm: followed by Dr. Prescott Roberts with serial CT scans.  5. Hyperlipidemia: treated by PCP. On crestor.   Current medicines are reviewed with the patient today.  The patient does not have  concerns regarding medicines.  Labs/ tests ordered today include:  No orders of the defined types were placed in this encounter.    Disposition:   FU Truitt Merle 6 months, I will plan to see in one year if clinically stable.  Deatra James, MD  07/18/2015 1:49 AM    [facility] [address], [place], [place]  [address] Phone: [phone]; Fax: [phone]

## 2015-07-16 NOTE — Patient Instructions (Addendum)
Medication Instructions:  Your physician recommends that you continue on your current medications as directed. Please refer to the Current Medication list given to you today.  When your insurance switches in January 2017 please start Entresto 24/26mg  take one tablet by mouth twice a day (patient given written Rx).  Please STOP Losartan 2 DAYS prior to starting Entresto.   Labwork: No new orders.   Testing/Procedures: No new orders.   Follow-Up: Your physician recommends that you schedule a follow-up appointment in: 6 MONTHS with Truitt Merle NP  Your physician wants you to follow-up in: 1 YEAR with Dr Burt Knack.  You will receive a reminder letter in the mail two months in advance. If you don't receive a letter, please call our office to schedule the follow-up appointment.   Any Other Special Instructions Will Be Listed Below (If Applicable).     If you need a refill on your cardiac medications before your next appointment, please call your pharmacy.

## 2015-07-19 ENCOUNTER — Ambulatory Visit: Payer: Medicare Other | Admitting: Gastroenterology

## 2015-07-25 ENCOUNTER — Ambulatory Visit (INDEPENDENT_AMBULATORY_CARE_PROVIDER_SITE_OTHER): Payer: Medicare Other | Admitting: *Deleted

## 2015-07-25 DIAGNOSIS — I255 Ischemic cardiomyopathy: Secondary | ICD-10-CM | POA: Diagnosis not present

## 2015-07-25 NOTE — Progress Notes (Signed)
Remote ICD transmission.   

## 2015-08-02 ENCOUNTER — Telehealth: Payer: Self-pay

## 2015-08-02 MED ORDER — SACUBITRIL-VALSARTAN 24-26 MG PO TABS: 1.0000 | ORAL_TABLET | Freq: Two times a day (BID) | ORAL | Status: DC

## 2015-08-02 NOTE — Telephone Encounter (Signed)
Prior auth for Entresto 24-26 sent to [facility]. 

## 2015-08-14 ENCOUNTER — Encounter: Payer: Self-pay | Admitting: Gastroenterology

## 2015-08-14 LAB — CUP PACEART REMOTE DEVICE CHECK
Battery Remaining Longevity: 74 mo
Battery Remaining Percentage: 63 %
HIGH POWER IMPEDANCE MEASURED VALUE: 68 Ohm
HIGH POWER IMPEDANCE MEASURED VALUE: 68 Ohm
Implantable Lead Implant Date: 20130108
Implantable Lead Location: 753860
Lead Channel Impedance Value: 490 Ohm
Lead Channel Sensing Intrinsic Amplitude: 12 mV
Lead Channel Setting Pacing Amplitude: 2.5 V
Lead Channel Setting Pacing Pulse Width: 0.5 ms
Lead Channel Setting Sensing Sensitivity: 0.5 mV
MDC IDC MSMT BATTERY VOLTAGE: 2.98 V
MDC IDC SESS DTM: 20161228080951
MDC IDC STAT BRADY RV PERCENT PACED: 1 %
Pulse Gen Serial Number: 1016523

## 2015-08-15 ENCOUNTER — Encounter: Payer: Self-pay | Admitting: Cardiology

## 2015-08-29 ENCOUNTER — Telehealth: Payer: Self-pay | Admitting: Cardiovascular Disease

## 2015-08-29 NOTE — Telephone Encounter (Signed)
Pt called in stating that she has been having some pain in her chest coming and going since yesterday along with some SOB. She would like to come in and be seen by tomorrow if she could. Please f/u with her

## 2015-08-29 NOTE — Telephone Encounter (Signed)
Pt was having issues w/ her home monitor. I instructed her to call tech services. Pt verbalized understanding.

## 2015-08-29 NOTE — Telephone Encounter (Signed)
Returned patient's call.  She states she feels like her heart is fluttering.  Advised patient that she should send a manual transmission for review.  She is agreeable and wrote down the steps to sending a transmission.  She states she will not be able to send one until later this afternoon because she is currently at her brother's house.  Advised patient that if the transmission comes across prior to 5:00pm, I will review it today.  If it is after 5:00pm, I will review it tomorrow prior to her appointment with Melina Copa.  Advised patient to go to the ED if her symptoms worsen and advised her not to drive herself.  Patient verbalizes understanding of all instructions and denies any additional questions at this time.

## 2015-08-29 NOTE — Telephone Encounter (Addendum)
Patient st that for 4 days, she has had a, intermittent "funny feeling" in her chest that she cannot describe. This happens on exertion, and she says sometimes it "just feels sore right over her heart." She has no VS to report, but says it does not feel like her heart is racing.  She is taking her medications as instructed and is convinced [name] is the cause of her problems (she started this medication 08/02/15). Scheduled patient with Melina Copa tomorrow for evaluation. Instructed patient to go to ED immediately if symptoms worsen. Patient agrees with treatment plan.   To [facility].

## 2015-08-29 NOTE — Telephone Encounter (Addendum)
Called patient to let her know that transmission was received and showed no alerts or abnormalities.  She is appreciative of update and is aware that she should still be seen tomorrow in office by Melina Copa.  Patient is agreeable to this plan and denies questions or concerns at this time.

## 2015-08-30 ENCOUNTER — Ambulatory Visit (INDEPENDENT_AMBULATORY_CARE_PROVIDER_SITE_OTHER): Payer: Medicare Other | Admitting: Physician Assistant

## 2015-08-30 ENCOUNTER — Encounter: Payer: Self-pay | Admitting: Physician Assistant

## 2015-08-30 ENCOUNTER — Ambulatory Visit: Payer: Medicare Other | Admitting: Physician Assistant

## 2015-08-30 VITALS — BP 120/80 | HR 88 | Ht 62.5 in | Wt 160.0 lb

## 2015-08-30 DIAGNOSIS — I255 Ischemic cardiomyopathy: Secondary | ICD-10-CM | POA: Diagnosis not present

## 2015-08-30 DIAGNOSIS — I10 Essential (primary) hypertension: Secondary | ICD-10-CM

## 2015-08-30 DIAGNOSIS — I2510 Atherosclerotic heart disease of native coronary artery without angina pectoris: Secondary | ICD-10-CM

## 2015-08-30 DIAGNOSIS — Z9861 Coronary angioplasty status: Secondary | ICD-10-CM

## 2015-08-30 DIAGNOSIS — R002 Palpitations: Secondary | ICD-10-CM

## 2015-08-30 DIAGNOSIS — I5022 Chronic systolic (congestive) heart failure: Secondary | ICD-10-CM | POA: Diagnosis not present

## 2015-08-30 DIAGNOSIS — R0602 Shortness of breath: Secondary | ICD-10-CM

## 2015-08-30 LAB — COMPREHENSIVE METABOLIC PANEL
ALT: 12 U/L (ref 6–29)
AST: 22 U/L (ref 10–35)
Albumin: 4 g/dL (ref 3.6–5.1)
Alkaline Phosphatase: 85 U/L (ref 33–130)
BUN: 26 mg/dL — AB (ref 7–25)
CHLORIDE: 111 mmol/L — AB (ref 98–110)
CO2: 19 mmol/L — AB (ref 20–31)
CREATININE: 1.43 mg/dL — AB (ref 0.50–0.99)
Calcium: 8.7 mg/dL (ref 8.6–10.4)
GLUCOSE: 101 mg/dL — AB (ref 65–99)
POTASSIUM: 4.5 mmol/L (ref 3.5–5.3)
SODIUM: 139 mmol/L (ref 135–146)
Total Bilirubin: 0.4 mg/dL (ref 0.2–1.2)
Total Protein: 6.8 g/dL (ref 6.1–8.1)

## 2015-08-30 LAB — CBC
HEMATOCRIT: 39.9 % (ref 36.0–46.0)
Hemoglobin: 13 g/dL (ref 12.0–15.0)
MCH: 32.5 pg (ref 26.0–34.0)
MCHC: 32.6 g/dL (ref 30.0–36.0)
MCV: 99.8 fL (ref 78.0–100.0)
MPV: 9.8 fL (ref 8.6–12.4)
PLATELETS: 233 10*3/uL (ref 150–400)
RBC: 4 MIL/uL (ref 3.87–5.11)
RDW: 14.1 % (ref 11.5–15.5)
WBC: 8.6 10*3/uL (ref 4.0–10.5)

## 2015-08-30 LAB — TROPONIN I

## 2015-08-30 LAB — TSH: TSH: 4.499 u[IU]/mL (ref 0.350–4.500)

## 2015-08-30 LAB — D-DIMER, QUANTITATIVE (NOT AT ARMC): D DIMER QUANT: 0.56 ug{FEU}/mL — AB (ref 0.00–0.50)

## 2015-08-30 NOTE — Progress Notes (Addendum)
Cardiology Office Note Date:  08/30/2015  Patient ID:  [name], [name] 1946-07-12, MRN [id number] PCP:  Leamon Arnt, MD  Cardiologist:  Burt Knack Electrophysiologist: Allred   Chief Complaint: chest feels funny  History of Present Illness: [name] is a 70 y.o. female with history of CAD (anterior STEMI rx with BMS to prox LAD in 02/2009, ISR s/p PTCA 06/2009, ISR s/p thrombectomy & PTCA 03/2010 due to late stent thrombosis), prior pulmonary embolism 2011, ICM/chronic systolic CHF s/p ST. Jude ICD 2013, h/o GIB on DAPT (on ASA only), memory problems, COPD, HLD, HTN, CKD stage III, aortic pseudoaneurysm of distal arch (followed by Dr. Prescott Gum). She has also been followed by the CHF clinic at one point. She started Entresto several weeks ago. Last echo 02/2015 showed EF 10-15% with multiple WMA, grade 1 DD, high ventricular filling pressure.  She presents for evaluation of feeling "funny" in her chest. Yesterday she began to notice that she felt exhausted, short of breath with ambulation, had a fluttering sensation in her chest, and had a discrete soreness right around her defibrillator site. She sent in a device transmission which was reviewed by EP and did not show any alerts or abnormalities. Since yesterday her SOB and fatigue is a little better, but she still feels limited by dyspnea when she exerts herself. She continues to feel chest fluttering right now (NSR/borderline sinus tach with occasional PAC). The pinpoint chest soreness comes and goes without any particular trigger. It is not worse with inspiration, movement, palpation or exertion. It feels better when she rubs the area. It does not feel like prior PE or MI pain that she can recall. She denies any recent evidence of bleeding. No weight gain, orthopnea or LEE.   Past Medical History  Diagnosis Date  . CAD (coronary artery disease) 02/2009    a. anterior STEMI rx with BMS to prox LAD in 02/2009. b. ISR s/p PTCA 06/2009. c.  ISR s/p thrombectomy & PTCA 03/2010 due to late stent thrombosis.   . Hyperlipemia   . Benign neoplasm of colon   . Unspecified mastoiditis   . Irritable bowel syndrome   . Hypertension   . Diverticulosis   . Colon polyp   . Ischemic cardiomyopathy     EF 10-15%  . Chronic systolic CHF (congestive heart failure) ([place])     a. s/p ST. Jude ICD 2013.  . Pulmonary embolism ([place]) 2011  . Chronic renal insufficiency   . Myocardial infarct ([place])   . Asthma   . Headache(784.0)   . Memory loss   . GI bleed     a. h/o GIB on DAPT, now on ASA only.  Marland Kitchen COPD (chronic obstructive pulmonary disease) ([place])   . 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 followed by Dr. Prescott Gum.    Past Surgical History  Procedure Laterality Date  . Total abdominal hysterectomy    . Bilateral salpingoophorectomy    . Inner ear surgery      left x 17  . Cervical spine surgery  08/08  . Lumbar disc surgery  08/09  . Cad( bare metal stent)  8/10  . Angioplasty  07/02/09, 04/01/10  . Implantable cardioverter defibrillator implant N/A 08/05/2011    Primary prevention SJM ICD implanted,  Analyze ST study patient    Current Outpatient Prescriptions  Medication Sig Dispense Refill  . albuterol (PROAIR HFA) 108 (90 BASE) MCG/ACT inhaler Inhale 2 puffs into the lungs every 6 (  six) hours as needed. For shortness of breath    . amitriptyline (ELAVIL) 50 MG tablet Take 50 mg by mouth at bedtime.    Marland Kitchen aspirin 81 MG tablet Take 81 mg by mouth every morning.     . bisoprolol (ZEBETA) 5 MG tablet TAKE 1 TABLET BY MOUTH DAILY. 90 tablet 3  . CRESTOR 20 MG tablet TAKE 1 TABLET BY MOUTH AT BEDTIME. 30 tablet 11  . esomeprazole (NEXIUM) 40 MG capsule Take 1 capsule (40 mg total) by mouth daily.    . polyethylene glycol powder (GLYCOLAX/MIRALAX) powder Take by mouth as directed.   1  . sacubitril-valsartan (ENTRESTO) 24-26 MG Take 1 tablet by mouth 2 (two) times daily. 60 tablet 6  . sucralfate (CARAFATE) 1 G tablet Take 1 tablet  (1 g total) by mouth 4 (four) times daily. 120 tablet 1  . topiramate (TOPAMAX) 50 MG tablet Take 1 tablet (50 mg total) by mouth daily. 180 tablet 3   No current facility-administered medications for this visit.    Allergies:   Lisinopril; Sulfa antibiotics; and Sulfonamide derivatives   Social History:  The patient  reports that she quit smoking about 6 years ago. Her smoking use included Cigarettes. She smoked 0.25 packs per day. She has never used smokeless tobacco. She reports that she does not drink alcohol or use illicit drugs.   Family History:  The patient's family history includes Bladder Cancer in her brother; Breast cancer in her cousin; Colon cancer in her brother and mother.  ROS:  Please see the history of present illness.  All other systems are reviewed and otherwise negative.   PHYSICAL EXAM:  VS:  BP 120/80 mmHg  Pulse 88  Ht 5' 2.5" (1.588 m)  Wt 160 lb (72.576 kg)  BMI 28.78 kg/m2 BMI: Body mass index is 28.78 kg/(m^2). Pulse ox 99% on RA Well nourished, well developed WF, in no acute distress HEENT: normocephalic, atraumatic Neck: no JVD, carotid bruits or masses Cardiac:  normal S1, S2; RRR; no murmurs, rubs, or gallops Lungs: slightly diminished BS at bases, otherwise clear to auscultation bilaterally, no wheezing, rhonchi or rales Abd: soft, nontender, no hepatomegaly, + BS MS: no deformity or atrophy Ext: no edema Skin: warm and dry, no rash Neuro:  moves all extremities spontaneously, no focal abnormalities noted, follows commands Psych: euthymic mood, full affect  EKG:  Done today shows sinus tach 101bpm incomplete RBBB, LAFB, TWI avL, nonspecific ST-T changes - no sig change from prior except rate is faster  Recent Labs: 04/09/2015: ALT 10; BUN 26*; Creatinine, Ser 1.41*; Hemoglobin 12.3; Platelets 187.0; Potassium 4.5; Sodium 139  No results found for requested labs within last 365 days.   CrCl cannot be calculated (Patient has no serum creatinine  result on file.).   Wt Readings from Last 3 Encounters:  08/30/15 160 lb (72.576 kg)  07/16/15 160 lb 12.8 oz (72.938 kg)  06/09/15 159 lb (72.122 kg)     Other studies reviewed: Additional studies/records reviewed today include: summarized above  ASSESSMENT AND PLAN:  1. Palpitations/dyspnea/chest soreness - symptoms are nonspecific for a particular process. EKG does demonstrate slightly higher HR compared to baseline. EKG showed HR 101 with occasional PAC. At rest she is 88 bpm and pulse ox is 99% on RA. Will need to evaluate for the following differential of worsening CHF, symptomatic anemia (given prior h/o GIB), progressive CAD, lyte abnormality, or recurrent PE. I discussed the case with Dr. Marlou Porch (DOD today). Will start with  further workup with labs (CMET, CBC, BNP, TSH, d-dimer) and CXR. Will base further clinical management on the results. If d-dimer is positive in absence of other clear etiology, will consider CTA vs VQ dependent on Cr although this PE is lower on the differential. Her prior PE occurred after trauma from breaking her foot. She has not had any recent injuries, has no signs of DVT, and is not hypoxic. Warning signs reviewed with patient.  2. ICM/Chronic systolic CHF s/p ICD implantation - as above, EP did not see anything unusual on device interrogation. Check BNP, BMET, CXR today. 3. CAD - chest soreness is atypical. However, given overall constellation of symptoms, will check stat troponin. If this is abnormal she will need to be called to go to the hospital. I will sign these instructions out to the PA on call this evening who may receive the result if it returns after the office closes. 4. Essential HTN - continue current regimen. Follow if any med adjustments are made.  Disposition: F/u with APP in 2 weeks.  Current medicines are reviewed at length with the patient today.  The patient did not have any concerns regarding medicines.  Raechel Ache  PA-C 08/30/2015 1:29 PM     [address] [place] [address] [phone] (office)  [phone] (fax)

## 2015-08-30 NOTE — Patient Instructions (Addendum)
Medication Instructions:  None  Labwork: CBC, BNP, TSH, BNP, DDimer, Troponin and CMET today  Testing/Procedures:  Your physician would like for you to get a chest x-ray.  A chest x-ray takes a picture of the organs and structures inside the chest, including the heart, lungs, and blood vessels. This test can show several things, including, whether the heart is enlarges; whether fluid is building up in the lungs; and whether pacemaker / defibrillator leads are still in place.   Follow-Up: Your physician recommends that you schedule a follow-up appointment in: 2 weeks with an extender.   Any Other Special Instructions Will Be Listed Below (If Applicable).     If you need a refill on your cardiac medications before your next appointment, please call your pharmacy.

## 2015-08-31 ENCOUNTER — Encounter (HOSPITAL_COMMUNITY)
Admission: RE | Admit: 2015-08-31 | Discharge: 2015-08-31 | Disposition: A | Discharge status: Home / Self Care | Payer: Medicare Other | Source: Ambulatory Visit | Attending: Physician Assistant | Admitting: Physician Assistant

## 2015-08-31 ENCOUNTER — Ambulatory Visit (HOSPITAL_COMMUNITY)
Admission: RE | Admit: 2015-08-31 | Discharge: 2015-08-31 | Disposition: A | Discharge status: Home / Self Care | Payer: Medicare Other | Source: Ambulatory Visit | Attending: Physician Assistant | Admitting: Physician Assistant

## 2015-08-31 ENCOUNTER — Telehealth: Payer: Self-pay | Admitting: *Deleted

## 2015-08-31 DIAGNOSIS — R079 Chest pain, unspecified: Secondary | ICD-10-CM | POA: Insufficient documentation

## 2015-08-31 DIAGNOSIS — I2510 Atherosclerotic heart disease of native coronary artery without angina pectoris: Secondary | ICD-10-CM | POA: Insufficient documentation

## 2015-08-31 DIAGNOSIS — Z9861 Coronary angioplasty status: Secondary | ICD-10-CM | POA: Diagnosis not present

## 2015-08-31 DIAGNOSIS — R002 Palpitations: Secondary | ICD-10-CM | POA: Insufficient documentation

## 2015-08-31 DIAGNOSIS — I5022 Chronic systolic (congestive) heart failure: Secondary | ICD-10-CM | POA: Diagnosis not present

## 2015-08-31 DIAGNOSIS — I10 Essential (primary) hypertension: Secondary | ICD-10-CM | POA: Insufficient documentation

## 2015-08-31 DIAGNOSIS — R0602 Shortness of breath: Secondary | ICD-10-CM | POA: Diagnosis present

## 2015-08-31 DIAGNOSIS — I255 Ischemic cardiomyopathy: Secondary | ICD-10-CM | POA: Insufficient documentation

## 2015-08-31 LAB — BRAIN NATRIURETIC PEPTIDE: Brain Natriuretic Peptide: 71.8 pg/mL (ref 0.0–100.0)

## 2015-08-31 MED ORDER — TECHNETIUM TO 99M ALBUMIN AGGREGATED
4.0000 | Freq: Once | INTRAVENOUS | Status: AC | PRN
  Administered 2015-08-31: 4 via INTRAVENOUS

## 2015-08-31 MED ORDER — TECHNETIUM TC 99M DIETHYLENETRIAME-PENTAACETIC ACID: 30.0000 | Freq: Once | INTRAVENOUS | Status: DC | PRN

## 2015-08-31 NOTE — Telephone Encounter (Signed)
Tried to call pt.. Pt needs a VQ scan to rule out blood clot in lung.  If pt doesn't call before 3-3:30, pt will need to go to the ER to get this scan.  While I was typing this message, pt called back and was given instructions to go to [facility] right now and have this scan. Pt verbalized understanding.

## 2015-08-31 NOTE — Telephone Encounter (Signed)
-----   Message from Charlie Pitter, [place] sent at 08/31/2015  1:38 PM EST ----- Please call patient. D-dimer mildly elevated so needs VQ scan today to exclude recurrent blood clot in lung. I do not see her CXR result back yet so this will probably need to be done before the VQ scan.  All of the rest of her labs are fairly unrevealing. Troponin was negative. BUN/Cr are similar to prior. Liver function and thyroid function were normal. CBC was normal so there is no evidence of bleeding at this time. If VQ is positive for clot, we will have her go to the hospital for admission. If it is negative we will have her observe her symptoms over the weekend and touch base with her on Monday. Dayna Dunn PA-C

## 2015-09-05 ENCOUNTER — Telehealth: Payer: Self-pay | Admitting: *Deleted

## 2015-09-05 MED ORDER — BISOPROLOL FUMARATE 10 MG PO TABS: 10.0000 mg | ORAL_TABLET | Freq: Every day | ORAL | Status: DC

## 2015-09-05 NOTE — Telephone Encounter (Signed)
Pt been advised to increase her Bisoprolol to 10 mg taking 1 tablet daily.  Pt advised to see her PCP to make sure that there is nothing non-caridac going on and to keep her f/u appt with [place] 09/13/15. Pt verbalized understanding.

## 2015-09-05 NOTE — Telephone Encounter (Signed)
-----   Message from Charlie Pitter, [place] sent at 09/04/2015  3:02 PM EST ----- Question whether she is symptomatic from a slightly higher HR than baseline. We can try doubling her bisoprolol to 10mg  daily and see if that helps. Keep f/u as scheduled with [place]. May also be worthwhile for her to see PCP to determine if anything else non-cardiac is going on. Dayna Dunn PA-C

## 2015-09-10 ENCOUNTER — Ambulatory Visit (INDEPENDENT_AMBULATORY_CARE_PROVIDER_SITE_OTHER): Payer: Medicare Other | Admitting: Nurse Practitioner

## 2015-09-10 ENCOUNTER — Encounter: Payer: Self-pay | Admitting: Nurse Practitioner

## 2015-09-10 VITALS — BP 124/78 | HR 72 | Ht 62.0 in | Wt 164.2 lb

## 2015-09-10 DIAGNOSIS — R51 Headache: Secondary | ICD-10-CM

## 2015-09-10 DIAGNOSIS — R519 Headache, unspecified: Secondary | ICD-10-CM

## 2015-09-10 MED ORDER — TOPIRAMATE 50 MG PO TABS
50.0000 mg | ORAL_TABLET | Freq: Every day | ORAL | Status: DC
Start: 2015-09-10 — End: 2016-08-26

## 2015-09-10 NOTE — Progress Notes (Signed)
[facility]  PATIENT: [name] DOB: 12-Aug-1945   REASON FOR VISIT: Follow-up for migraine HISTORY FROM: Patient    HISTORY OF PRESENT ILLNESS: [name] is a 70 year old right-handed white divorced female with a history of headaches and essential tremor. Patient of Dr. Erling Cruz,  She has history of multiple operations on the left mastoid between the ages of 63 and 70. She is currently on disability for that reason. She has CAD, PE, she has congestive heart failure, COPD, irritable bowel syndrome, left ankle fracture, hyperlipidemia, acute renal failure with stage III chronic renal disease, metabolic acidosis from Topamax, pulmonary embolism 03/2010, and a defibrillator was placed 08/2011.  She has history of migraine since age 86s, she complains of vertex area pressure headache, she has 2-6 headaches each month, She is taking amitriptyline 50mg  qhs, which has been helpful. She took overcounter medication for nause, as needed, her headache can last all day long, ASA make her stomach hurt,  She brought her headache diarrhea, she had 4-6 headache each month,  UPDATE Jan 27th 2015:YYHer headaches is overall much better, less frequent, but 5 headche very severe in 2 months, she had nause, vomitting with it. She wants more medication to help her preventing her severe migraines Fioricet helps her some. But not the severe headaches. UPDATE March 4th 2016:YYHer migraine has much improved, she only had 3-4 migraines since last visit in Jan 2015, tolerating topamax 50mg  bid, she is dread of pollen season when she gets most of her migraines UPDATE 09/10/2015. Patient returns for follow-up after last visit in March 2016. Her headaches are currently well controlled she has had 3 headaches since last seen. She is currently taking Topamax 50 mg daily instead of twice a day. She needs refills and returns for reevaluation  REVIEW OF SYSTEMS: Full 14 system review of systems performed  and notable only for those listed, all others are neg:  Constitutional: neg  Cardiovascular: neg Ear/Nose/Throat: hearing loss Skin: neg Eyes: neg Respiratory: neg Gastroitestinal: nconstipation Hematology/Lymphatic: neg  Endocrine: neg Musculoskeletal:neg Allergy/Immunology: neg Neurological: headache dizziness Psychiatric: neg Sleep : neg   ALLERGIES: Allergies  Allergen Reactions  . Lisinopril     cough  . Sulfa Antibiotics     cough  . Sulfonamide Derivatives Other (See Comments)    UNSURE    HOME MEDICATIONS: Outpatient Prescriptions Prior to Visit  Medication Sig Dispense Refill  . albuterol (PROAIR HFA) 108 (90 BASE) MCG/ACT inhaler Inhale 2 puffs into the lungs every 6 (six) hours as needed. For shortness of breath    . amitriptyline (ELAVIL) 50 MG tablet Take 50 mg by mouth at bedtime.    Marland Kitchen aspirin 81 MG tablet Take 81 mg by mouth every morning.     . bisoprolol (ZEBETA) 10 MG tablet Take 1 tablet (10 mg total) by mouth daily. 90 tablet 0  . CRESTOR 20 MG tablet TAKE 1 TABLET BY MOUTH AT BEDTIME. 30 tablet 11  . esomeprazole (NEXIUM) 40 MG capsule Take 1 capsule (40 mg total) by mouth daily.    . polyethylene glycol powder (GLYCOLAX/MIRALAX) powder Take by mouth as directed.   1  . sacubitril-valsartan (ENTRESTO) 24-26 MG Take 1 tablet by mouth 2 (two) times daily. 60 tablet 6  . sucralfate (CARAFATE) 1 G tablet Take 1 tablet (1 g total) by mouth 4 (four) times daily. 120 tablet 1  . topiramate (TOPAMAX) 50 MG tablet Take 1 tablet (50 mg total) by mouth daily. 180 tablet 3  No facility-administered medications prior to visit.    PAST MEDICAL HISTORY: Past Medical History  Diagnosis Date  . CAD (coronary artery disease) 02/2009    a. anterior STEMI rx with BMS to prox LAD in 02/2009. b. ISR s/p PTCA 06/2009. c. ISR s/p thrombectomy & PTCA 03/2010 due to late stent thrombosis.   . Hyperlipemia   . Benign neoplasm of colon   . Unspecified mastoiditis   .  Irritable bowel syndrome   . Hypertension   . Diverticulosis   . Colon polyp   . Ischemic cardiomyopathy     EF 10-15%  . Chronic systolic CHF (congestive heart failure) ([place])     a. s/p ST. Jude ICD 2013.  . Pulmonary embolism ([place]) 2011  . Chronic renal insufficiency   . Myocardial infarct ([place])   . Asthma   . Headache(784.0)   . Memory loss   . GI bleed     a. h/o GIB on DAPT, now on ASA only.  Marland Kitchen COPD (chronic obstructive pulmonary disease) ([facility])   . 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 followed by Dr. Prescott Gum.    PAST SURGICAL HISTORY: Past Surgical History  Procedure Laterality Date  . Total abdominal hysterectomy    . Bilateral salpingoophorectomy    . Inner ear surgery      left x 17  . Cervical spine surgery  08/08  . Lumbar disc surgery  08/09  . Cad( bare metal stent)  8/10  . Angioplasty  07/02/09, 04/01/10  . Implantable cardioverter defibrillator implant N/A 08/05/2011    Primary prevention SJM ICD implanted,  Analyze ST study patient    FAMILY HISTORY: Family History  Problem Relation Age of Onset  . Colon cancer Mother   . Colon cancer Brother   . Bladder Cancer Brother   . Breast cancer Cousin     SOCIAL HISTORY: Social History   Social History  . Marital Status: Divorced    Spouse Name: N/A  . Number of Children: 2  . Years of Education: 11   Occupational History  . Unemployed   .     Social History Main Topics  . Smoking status: Former Smoker -- 0.25 packs/day    Types: Cigarettes    Quit date: 04/27/2009  . Smokeless tobacco: Never Used  . Alcohol Use: No  . Drug Use: No  . Sexual Activity: Not on file   Other Topics Concern  . Not on file   Social History Narrative   Pt lives in [place] alone.  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   Patient has 11 th grade education.Right handed.   Caffeine- one cup daily           PHYSICAL EXAM  Filed Vitals:   09/10/15 1353  BP: 124/78  Pulse: 72  Height: 5\' 2"   (1.575 m)  Weight: 164 lb 3.2 oz (74.481 kg)   Body mass index is 30.03 kg/(m^2).  Generalized: Well developed,obese female  in no acute distress  Head: normocephalic and atraumatic,. Oropharynx benign  Neck: Supple, no carotid bruits  Cardiac: Regular rate rhythm, no murmur  Musculoskeletal: No deformity   Neurological examination   Mentation: Alert oriented to time, place, history taking. Attention span and concentration appropriate. Recent and remote memory intact.  Follows all commands speech and language fluent.   Cranial nerve II-XII: Pupils were equal round reactive to light extraocular movements were full, visual field were full on confrontational test. Facial sensation and  strength were normal. Hard of hearing, left greater than right. Uvula tongue midline. head turning and shoulder shrug were normal and symmetric.Tongue protrusion into cheek strength was normal. Motor: normal bulk and tone, full strength in the BUE, BLE, fine finger movements normal, no pronator drift. No focal weakness Coordination: finger-nose-finger, heel-to-shin bilaterally, no dysmetria Reflexes: Brachioradialis 2/2, biceps 2/2, triceps 2/2, patellar 2/2, Achilles 2/2, plantar responses were flexor bilaterally. Gait and Station: Rising up from seated position without assistance, normal stance,  moderate stride, good arm swing, smooth turning, able to perform tiptoe, and heel walking without difficulty. Tandem gait is unsteady. No assistive device  DIAGNOSTIC DATA (LABS, IMAGING, TESTING) - I reviewed patient records, labs, notes, testing and imaging myself where available.  Lab Results  Component Value Date   WBC 8.6 08/30/2015   HGB 13.0 08/30/2015   HCT 39.9 08/30/2015   MCV 99.8 08/30/2015   PLT 233 08/30/2015      Component Value Date/Time   NA 139 08/30/2015 1412   K 4.5 08/30/2015 1412   CL 111* 08/30/2015 1412   CO2 19* 08/30/2015 1412   GLUCOSE 101* 08/30/2015 1412   BUN 26* 08/30/2015  1412   CREATININE 1.43* 08/30/2015 1412   CREATININE 1.41* 04/09/2015 1051   CALCIUM 8.7 08/30/2015 1412   PROT 6.8 08/30/2015 1412   ALBUMIN 4.0 08/30/2015 1412   AST 22 08/30/2015 1412   ALT 12 08/30/2015 1412   ALKPHOS 85 08/30/2015 1412   BILITOT 0.4 08/30/2015 1412   GFRNONAA 54* 12/26/2012 0159   GFRAA 61* 12/26/2012 0159     Lab Results  Component Value Date   TSH 4.499 08/30/2015      ASSESSMENT AND PLAN  70 y.o. year old female  has a past medical history migraine headaches. Also past medical history of hyperlipidemia, coronary artery disease status post pacemaker insertion.   Continue Topamax 50mg  daily will refill for one year  follow-up yearly and when necessary Dennie Bible, [facility], [facility], [facility] [address], [place], [place] [address] [phone]

## 2015-09-10 NOTE — Patient Instructions (Signed)
Continue Topamax will refill for one year  follow-up yearly and when necessary

## 2015-09-13 ENCOUNTER — Ambulatory Visit: Payer: Medicare Other | Admitting: Cardiology

## 2015-10-24 ENCOUNTER — Ambulatory Visit (INDEPENDENT_AMBULATORY_CARE_PROVIDER_SITE_OTHER): Payer: Medicare Other | Admitting: *Deleted

## 2015-10-24 DIAGNOSIS — I255 Ischemic cardiomyopathy: Secondary | ICD-10-CM | POA: Diagnosis not present

## 2015-10-24 NOTE — Progress Notes (Signed)
Remote ICD transmission.   

## 2015-11-23 ENCOUNTER — Encounter: Payer: Self-pay | Admitting: Cardiology

## 2015-11-23 LAB — CUP PACEART REMOTE DEVICE CHECK
Battery Remaining Longevity: 70 mo
Battery Remaining Percentage: 61 %
Battery Voltage: 2.96 V
Date Time Interrogation Session: 20170329060014
HIGH POWER IMPEDANCE MEASURED VALUE: 84 Ohm
HIGH POWER IMPEDANCE MEASURED VALUE: 84 Ohm
Implantable Lead Location: 753860
Lead Channel Impedance Value: 490 Ohm
Lead Channel Sensing Intrinsic Amplitude: 12 mV
Lead Channel Setting Pacing Amplitude: 2.5 V
MDC IDC LEAD IMPLANT DT: 20130108
MDC IDC MSMT LEADCHNL RV PACING THRESHOLD AMPLITUDE: 0.75 V
MDC IDC MSMT LEADCHNL RV PACING THRESHOLD PULSEWIDTH: 0.5 ms
MDC IDC SET LEADCHNL RV PACING PULSEWIDTH: 0.5 ms
MDC IDC SET LEADCHNL RV SENSING SENSITIVITY: 0.5 mV
MDC IDC STAT BRADY RV PERCENT PACED: 1 %
Pulse Gen Serial Number: 1016523

## 2015-12-11 ENCOUNTER — Encounter: Payer: Self-pay | Admitting: Gastroenterology

## 2016-01-15 ENCOUNTER — Ambulatory Visit (INDEPENDENT_AMBULATORY_CARE_PROVIDER_SITE_OTHER): Payer: Medicare Other | Admitting: Nurse Practitioner

## 2016-01-15 ENCOUNTER — Other Ambulatory Visit: Payer: Self-pay | Admitting: Physician Assistant

## 2016-01-15 ENCOUNTER — Encounter: Payer: Self-pay | Admitting: Nurse Practitioner

## 2016-01-15 VITALS — BP 100/60 | HR 78 | Ht 62.0 in | Wt 169.8 lb

## 2016-01-15 DIAGNOSIS — I2510 Atherosclerotic heart disease of native coronary artery without angina pectoris: Secondary | ICD-10-CM | POA: Diagnosis not present

## 2016-01-15 DIAGNOSIS — I10 Essential (primary) hypertension: Secondary | ICD-10-CM | POA: Diagnosis not present

## 2016-01-15 DIAGNOSIS — I5022 Chronic systolic (congestive) heart failure: Secondary | ICD-10-CM

## 2016-01-15 DIAGNOSIS — I255 Ischemic cardiomyopathy: Secondary | ICD-10-CM

## 2016-01-15 LAB — HEPATIC FUNCTION PANEL
ALT: 11 U/L (ref 6–29)
AST: 13 U/L (ref 10–35)
Albumin: 4.1 g/dL (ref 3.6–5.1)
Alkaline Phosphatase: 83 U/L (ref 33–130)
Bilirubin, Direct: 0.1 mg/dL (ref <=0.2)
Indirect Bilirubin: 0.2 mg/dL (ref 0.2–1.2)
Total Bilirubin: 0.3 mg/dL (ref 0.2–1.2)
Total Protein: 6.7 g/dL (ref 6.1–8.1)

## 2016-01-15 LAB — BASIC METABOLIC PANEL
BUN: 29 mg/dL — ABNORMAL HIGH (ref 7–25)
CO2: 21 mmol/L (ref 20–31)
Calcium: 9.2 mg/dL (ref 8.6–10.4)
Chloride: 110 mmol/L (ref 98–110)
Creat: 1.56 mg/dL — ABNORMAL HIGH (ref 0.50–0.99)
Glucose, Bld: 95 mg/dL (ref 65–99)
Potassium: 5.1 mmol/L (ref 3.5–5.3)
Sodium: 143 mmol/L (ref 135–146)

## 2016-01-15 LAB — LIPID PANEL
Cholesterol: 140 mg/dL (ref 125–200)
HDL: 52 mg/dL (ref >=46)
LDL Cholesterol: 43 mg/dL (ref <130)
Total CHOL/HDL Ratio: 2.7 Ratio (ref <=5.0)
Triglycerides: 224 mg/dL — ABNORMAL HIGH (ref <150)
VLDL: 45 mg/dL — ABNORMAL HIGH (ref <30)

## 2016-01-15 LAB — CBC
HCT: 38.9 % (ref 35.0–45.0)
Hemoglobin: 12.9 g/dL (ref 11.7–15.5)
MCH: 32.7 pg (ref 27.0–33.0)
MCHC: 33.2 g/dL (ref 32.0–36.0)
MCV: 98.5 fL (ref 80.0–100.0)
MPV: 10.3 fL (ref 7.5–12.5)
Platelets: 216 10*3/uL (ref 140–400)
RBC: 3.95 MIL/uL (ref 3.80–5.10)
RDW: 14.4 % (ref 11.0–15.0)
WBC: 6.9 10*3/uL (ref 3.8–10.8)

## 2016-01-15 NOTE — Progress Notes (Signed)
CARDIOLOGY OFFICE NOTE  Date:  01/15/2016    [name] Date of Birth: April 22, 1946 Medical Record [id number]  PCP:  Leamon Arnt, MD  Cardiologist:  Burt Knack   Chief Complaint  Patient presents with  . Coronary Artery Disease  . Hypertension  . Hyperlipidemia    Seen for Dr. Burt Knack    History of Present Illness: [name] is a 70 y.o. female who presents today for a follow up visit. Seen for Dr. Burt Knack. This is a 4 month check.   She has a history of CAD (anterior STEMI rx with BMS to prox LAD in 02/2009, ISR s/p PTCA 06/2009, ISR s/p thrombectomy & PTCA 03/2010 due to late stent thrombosis), prior pulmonary embolism 2011, ICM/chronic systolic CHF s/p ST. Jude ICD 2013, h/o GIB on DAPT (on ASA only), memory problems, COPD, HLD, HTN, CKD stage III, aortic pseudoaneurysm of distal arch (followed by Dr. Prescott Gum). She has also been followed by the CHF clinic at one point.  Last echo 02/2015 showed EF 10-15% with multiple WMA, grade 1 DD, high ventricular filling pressure.   Last seen back in February. Was having some atypical chest pain.   Comes back today. Here alone. Seems to be at her baseline. She gets tired and short of breath with minimal activities - this is no different. No real chest pain. Says her memory continues to worsen. Not sure who is doing her lipids. Occasional headache. Says she is not missing her medicines. Sounds like her brother helps her. She is able to afford Entresto. No shocks. She notes she has gained weight but feels like it is due to "good appetite". No swelling. No bloating and appetite is "very good".  Past Medical History  Diagnosis Date  . CAD (coronary artery disease) 02/2009    a. anterior STEMI rx with BMS to prox LAD in 02/2009. b. ISR s/p PTCA 06/2009. c. ISR s/p thrombectomy & PTCA 03/2010 due to late stent thrombosis.   . Hyperlipemia   . Benign neoplasm of colon   . Unspecified mastoiditis   . Irritable bowel syndrome   .  Hypertension   . Diverticulosis   . Colon polyp   . Ischemic cardiomyopathy     EF 10-15%  . Chronic systolic CHF (congestive heart failure) ([place])     a. s/p ST. Jude ICD 2013.  . Pulmonary embolism ([place]) 2011  . Chronic renal insufficiency   . Myocardial infarct ([place])   . Asthma   . Headache(784.0)   . Memory loss   . GI bleed     a. h/o GIB on DAPT, now on ASA only.  Marland Kitchen COPD (chronic obstructive pulmonary disease) ([place])   . 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 followed by Dr. Prescott Gum.    Past Surgical History  Procedure Laterality Date  . Total abdominal hysterectomy    . Bilateral salpingoophorectomy    . Inner ear surgery      left x 17  . Cervical spine surgery  08/08  . Lumbar disc surgery  08/09  . Cad( bare metal stent)  8/10  . Angioplasty  07/02/09, 04/01/10  . Implantable cardioverter defibrillator implant N/A 08/05/2011    Primary prevention SJM ICD implanted,  Analyze ST study patient     Medications: Current Outpatient Prescriptions  Medication Sig Dispense Refill  . albuterol (PROAIR HFA) 108 (90 BASE) MCG/ACT inhaler Inhale 2 puffs into the lungs every 6 (six) hours as needed.  For shortness of breath    . amitriptyline (ELAVIL) 50 MG tablet Take 50 mg by mouth at bedtime.    Marland Kitchen aspirin 81 MG tablet Take 81 mg by mouth every morning.     . bisoprolol (ZEBETA) 10 MG tablet Take 1 tablet (10 mg total) by mouth daily. 90 tablet 0  . CRESTOR 20 MG tablet TAKE 1 TABLET BY MOUTH AT BEDTIME. 30 tablet 11  . esomeprazole (NEXIUM) 40 MG capsule Take 1 capsule (40 mg total) by mouth daily.    Marland Kitchen ibandronate (BONIVA) 150 MG tablet Take 150 mg by mouth every 30 (thirty) days. Take in the morning with a full glass of water, on an empty stomach, and do not take anything else by mouth or lie down for the next 30 min.    . polyethylene glycol powder (GLYCOLAX/MIRALAX) powder Take by mouth as directed.   1  . sacubitril-valsartan (ENTRESTO) 24-26 MG Take 1 tablet by mouth 2  (two) times daily. 60 tablet 6  . topiramate (TOPAMAX) 50 MG tablet Take 1 tablet (50 mg total) by mouth daily. 90 tablet 3   No current facility-administered medications for this visit.    Allergies: Allergies  Allergen Reactions  . Lisinopril     cough  . Sulfa Antibiotics     cough  . Sulfonamide Derivatives Other (See Comments)    UNSURE    Social History: The patient  reports that she quit smoking about 6 years ago. Her smoking use included Cigarettes. She smoked 0.25 packs per day. She has never used smokeless tobacco. She reports that she does not drink alcohol or use illicit drugs.   Family History: The patient's family history includes Bladder Cancer in her brother; Breast cancer in her cousin; Colon cancer in her brother and mother.   Review of Systems: Please see the history of present illness.   Otherwise, the review of systems is positive for none.   All other systems are reviewed and negative.   Physical Exam: VS:  BP 100/60 mmHg  Pulse 78  Ht 5\' 2"  (1.575 m)  Wt 169 lb 12.8 oz (77.021 kg)  BMI 31.05 kg/m2  SpO2 96% .  BMI Body mass index is 31.05 kg/(m^2).  Wt Readings from Last 3 Encounters:  01/15/16 169 lb 12.8 oz (77.021 kg)  09/10/15 164 lb 3.2 oz (74.481 kg)  08/30/15 160 lb (72.576 kg)    General: Pleasant. She looks chronically ill. Pale but alert and in no acute distress.  HEENT: Normal. Neck: Supple, no JVD, carotid bruits, or masses noted.  Cardiac: Regular rate and rhythm. Heart tones distant. No edema.  Respiratory:  Lungs are clear to auscultation bilaterally with normal work of breathing.  GI: Soft and nontender.  MS: No deformity or atrophy. Gait and ROM intact. Skin: Warm and dry. Color is normal.  Neuro:  Strength and sensation are intact and no gross focal deficits noted.  Psych: Alert, appropriate and with normal affect.   LABORATORY DATA:  EKG:  EKG is not ordered today.  Lab Results  Component Value Date   WBC 8.6  08/30/2015   HGB 13.0 08/30/2015   HCT 39.9 08/30/2015   PLT 233 08/30/2015   GLUCOSE 101* 08/30/2015   CHOL 139 01/27/2013   TRIG 164.0* 01/27/2013   HDL 44.90 01/27/2013   LDLCALC 61 01/27/2013   ALT 12 08/30/2015   AST 22 08/30/2015   NA 139 08/30/2015   K 4.5 08/30/2015   CL 111* 08/30/2015  CREATININE 1.43* 08/30/2015   BUN 26* 08/30/2015   CO2 19* 08/30/2015   TSH 4.499 08/30/2015   INR 0.99 01/08/2012   HGBA1C  03/07/2009    5.8 (NOTE) The ADA recommends the following therapeutic goal for glycemic control related to Hgb A1c measurement: Goal of therapy: <6.5 Hgb A1c  Reference: American Diabetes Association: Clinical Practice Recommendations 2010, Diabetes Care, 2010, 33: (Suppl  1).    BNP (last 3 results)  Recent Labs  08/30/15 1412  BNP 71.8    ProBNP (last 3 results) No results for input(s): PROBNP in the last 8760 hours.   Other Studies Reviewed Today:  Echo Study Conclusions from 02/2015  - Left ventricle: The cavity size was moderately dilated. Wall  thickness was normal. Systolic function was severely reduced. The  estimated ejection fraction was in the range of 10% to 15%.  Severe hypokinesis of the anteroseptal myocardium. Hypokinesis of  the inferoseptal myocardium. Dyskinesis of the apical myocardium.  Hypokinesis of the inferior myocardium. Akinesis of the anterior  myocardium. Doppler parameters are consistent with abnormal left  ventricular relaxation (grade 1 diastolic dysfunction). Doppler  parameters are consistent with high ventricular filling pressure. - Mitral valve: There was trivial regurgitation.   Notes Recorded by [name], MD on 08/14/2013 at 11:06 PM Stress test shows large scar without ischemia. Would continue with medical therapy unless she develops exertional angina. thx  Assessment/Plan: 1. Ischemic CM with EF of 10 to 15% - Volume status looks good. She seems to be at her baseline/NYHA III/IV - given  memory problems and COPD she is not a candidate for advanced therapies per prior notation by CHF team. BP too soft for aldactone. Needs labs today. See back in about 4 months.    2. CAD - no active chest pain.   3. HLD - on statin therapy - checking labs today.  4. Memory issues  5. CKD  6. Underlying ICD - needs EP recall for later this fall.  Current medicines are reviewed with the patient today.  The patient does not have concerns regarding medicines other than what has been noted above.  The following changes have been made:  See above.  Labs/ tests ordered today include:    Orders Placed This Encounter  Procedures  . Brain natriuretic peptide  . Basic metabolic panel  . CBC  . Hepatic function panel  . Lipid panel     Disposition:   FU with Dr. Burt Knack in 4  months.   Patient is agreeable to this plan and will call if any problems develop in the interim.   Signed: Burtis Junes, RN, ANP-C 01/15/2016 1:39 PM  [facility] [address] [place], [place]  [address] Phone: [phone] Fax: [phone]

## 2016-01-15 NOTE — Patient Instructions (Addendum)
We will be checking the following labs today - BMET, CBC, HPF, Lipids and BNP   Medication Instructions:    Continue with your current medicines.     Testing/Procedures To Be Arranged:  N/A  Follow-Up:   See Dr. Burt Knack in 4 months  Needs EP visit in September with Dr. Rayann Heman and team    Other Special Instructions:   N/A    If you need a refill on your cardiac medications before your next appointment, please call your pharmacy.   Call the [place] office at [phone] if you have any questions, problems or concerns.

## 2016-01-16 LAB — BRAIN NATRIURETIC PEPTIDE: Brain Natriuretic Peptide: 105.4 pg/mL — ABNORMAL HIGH (ref <100)

## 2016-01-23 ENCOUNTER — Ambulatory Visit (INDEPENDENT_AMBULATORY_CARE_PROVIDER_SITE_OTHER): Payer: Medicare Other | Admitting: *Deleted

## 2016-01-23 DIAGNOSIS — I255 Ischemic cardiomyopathy: Secondary | ICD-10-CM | POA: Diagnosis not present

## 2016-01-23 LAB — CUP PACEART REMOTE DEVICE CHECK
Battery Voltage: 2.96 V
Brady Statistic RV Percent Paced: 1 %
HIGH POWER IMPEDANCE MEASURED VALUE: 90 Ohm
HighPow Impedance: 90 Ohm
Implantable Lead Implant Date: 20130108
Implantable Lead Location: 753860
Lead Channel Pacing Threshold Pulse Width: 0.5 ms
Lead Channel Sensing Intrinsic Amplitude: 12 mV
Lead Channel Setting Pacing Pulse Width: 0.5 ms
MDC IDC MSMT BATTERY REMAINING LONGEVITY: 69 mo
MDC IDC MSMT BATTERY REMAINING PERCENTAGE: 59 %
MDC IDC MSMT LEADCHNL RV IMPEDANCE VALUE: 510 Ohm
MDC IDC MSMT LEADCHNL RV PACING THRESHOLD AMPLITUDE: 0.75 V
MDC IDC SESS DTM: 20170628071430
MDC IDC SET LEADCHNL RV PACING AMPLITUDE: 2.5 V
MDC IDC SET LEADCHNL RV SENSING SENSITIVITY: 0.5 mV
Pulse Gen Serial Number: 1016523

## 2016-01-23 NOTE — Progress Notes (Signed)
Remote ICD transmission.   

## 2016-01-25 ENCOUNTER — Encounter: Payer: Self-pay | Admitting: Cardiology

## 2016-03-17 ENCOUNTER — Other Ambulatory Visit: Payer: Self-pay | Admitting: Cardiovascular Disease

## 2016-03-20 ENCOUNTER — Telehealth: Payer: Self-pay

## 2016-03-20 ENCOUNTER — Encounter: Payer: Self-pay | Admitting: Gastroenterology

## 2016-03-20 ENCOUNTER — Encounter (INDEPENDENT_AMBULATORY_CARE_PROVIDER_SITE_OTHER): Payer: Self-pay

## 2016-03-20 ENCOUNTER — Ambulatory Visit (INDEPENDENT_AMBULATORY_CARE_PROVIDER_SITE_OTHER): Payer: Medicare Other | Admitting: Gastroenterology

## 2016-03-20 VITALS — BP 110/72 | HR 72 | Ht 62.25 in | Wt 171.5 lb

## 2016-03-20 DIAGNOSIS — R109 Unspecified abdominal pain: Secondary | ICD-10-CM | POA: Diagnosis not present

## 2016-03-20 DIAGNOSIS — I255 Ischemic cardiomyopathy: Secondary | ICD-10-CM

## 2016-03-20 DIAGNOSIS — Z86010 Personal history of colonic polyps: Secondary | ICD-10-CM | POA: Diagnosis not present

## 2016-03-20 DIAGNOSIS — I5022 Chronic systolic (congestive) heart failure: Secondary | ICD-10-CM | POA: Diagnosis not present

## 2016-03-20 MED ORDER — SUCRALFATE 1 GM/10ML PO SUSP: 1.0000 g | Freq: Three times a day (TID) | ORAL | 1 refills | Status: DC

## 2016-03-20 MED ORDER — ESOMEPRAZOLE MAGNESIUM 40 MG PO CPDR: 40.0000 mg | DELAYED_RELEASE_CAPSULE | Freq: Two times a day (BID) | ORAL | 11 refills | Status: DC

## 2016-03-20 MED ORDER — NA SULFATE-K SULFATE-MG SULF 17.5-3.13-1.6 GM/177ML PO SOLN: 1.0000 | Freq: Once | ORAL | 0 refills | Status: AC

## 2016-03-20 NOTE — Progress Notes (Signed)
HPI :  70 y/o female for abdominal pain, recall colonoscopy. She has a history of CAD with stents in place, history of CHF, prior GI bleeding on dual platelet therapy, last EF of 10-15%.   She has history of coronary artery disease with a severe ischemic cardiomyopathy. Most recent echo in August 2016 showing an EF of 10-15%. She does have an ICD in place, also with history of hypertension, adenomatous colon polyps and GERD. EGD in 2011 was normal. Colonoscopy in June 2012 for follow-up of adenomatous polyps was a negative exam. She has had CT of the abdomen and pelvis done which showed scattered atherosclerotic plaques along the aorta. There was noted wall thickening of the distal gastric antrum, pylorus and duodenal bulb concerning for ulcer disease.  She previously had seen me for epigastric pain in November which had resolved on higher dose PPI and carafate. She deferred EGD at the time given her significant cardiac issues.   Since the last visit she has had some pains for the past month in the epigastric area which have recurred and similar to her prior symptoms. The pain can bother her at night and when she sleeps. Doesn't bother her much during the day. She does not think eating can make it worse or precipitate it but she's not sure. No nausea or vomiting. She has some baseline constipation, she has a bowel movement once every 3 days. She takes miralax which works well for her. She is taking her nexium every day. She takes a baby aspirin daily, no plavix. Mother had colon cancer at age 29. She denies any blood in the stools. Brown stools.   She reports less dyspnea that previously since being on Entreso, compared to when I last saw her.   Colonoscopy 01/07/2011 - normal exam Colonoscopy 05/18/2010 - 37mm polyp in cecum removed and site ablated with APC, 34mm sigmoid polyp - both adenomas EGD 05/17/2010 - normal  EGD 04/2009 - stricture at GEJ s/p dilation with 36 Fr Maloney, mild  gastritis Colonoscopy 04/2007 - 50mm polyp - adenoma  Past Medical History:  Diagnosis Date  . Aortic arch pseudoaneurysm (HCC)    a. followed by Dr. Prescott Gum.  . Asthma   . Benign neoplasm of colon   . CAD (coronary artery disease) 02/2009   a. anterior STEMI rx with BMS to prox LAD in 02/2009. b. ISR s/p PTCA 06/2009. c. ISR s/p thrombectomy & PTCA 03/2010 due to late stent thrombosis.   . Chronic renal insufficiency   . Chronic systolic CHF (congestive heart failure) ([place])    a. s/p ST. Jude ICD 2013.  . Colon polyp   . COPD (chronic obstructive pulmonary disease) ([place])   . Diverticulosis   . GI bleed    a. h/o GIB on DAPT, now on ASA only.  Marland Kitchen Headache(784.0)   . Hyperlipemia   . Hypertension   . Irritable bowel syndrome   . Ischemic cardiomyopathy    EF 10-15%  . Memory loss   . Myocardial infarct ([place])   . Pulmonary embolism ([place]) 2011  . Unspecified mastoiditis      Past Surgical History:  Procedure Laterality Date  . ANGIOPLASTY  07/02/09, 04/01/10  . BILATERAL SALPINGOOPHORECTOMY    . CAD( bare metal stent)  8/10  . CERVICAL SPINE SURGERY  08/08  . IMPLANTABLE CARDIOVERTER DEFIBRILLATOR IMPLANT N/A 08/05/2011   Primary prevention SJM ICD implanted,  Analyze ST study patient  . INNER EAR SURGERY     left  x 17  . [place] SURGERY  08/09  . TOTAL ABDOMINAL HYSTERECTOMY     Family History  Problem Relation Age of Onset  . Colon cancer Mother   . Colon cancer Brother   . Bladder Cancer Brother   . Breast cancer Cousin    Social History  Substance Use Topics  . Smoking status: Former Smoker    Packs/day: 0.25    Types: Cigarettes    Quit date: 04/27/2009  . Smokeless tobacco: Never Used  . Alcohol use No   Current Outpatient Prescriptions  Medication Sig Dispense Refill  . albuterol (PROAIR HFA) 108 (90 BASE) MCG/ACT inhaler Inhale 2 puffs into the lungs every 6 (six) hours as needed. For shortness of breath    . amitriptyline (ELAVIL) 50 MG tablet Take 50  mg by mouth at bedtime.    Marland Kitchen aspirin 81 MG tablet Take 81 mg by mouth every morning.     . bisoprolol (ZEBETA) 5 MG tablet TAKE 1 TABLET BY MOUTH DAILY. 90 tablet 3  . CRESTOR 20 MG tablet TAKE 1 TABLET BY MOUTH AT BEDTIME. 30 tablet 11  . ENTRESTO 24-26 MG TAKE 1 TABLET BY MOUTH TWICE DAILY 60 tablet 10  . esomeprazole (NEXIUM) 40 MG capsule Take 1 capsule (40 mg total) by mouth daily.    Marland Kitchen ibandronate (BONIVA) 150 MG tablet Take 150 mg by mouth every 30 (thirty) days. Take in the morning with a full glass of water, on an empty stomach, and do not take anything else by mouth or lie down for the next 30 min.    . polyethylene glycol powder (GLYCOLAX/MIRALAX) powder Take by mouth as directed.   1  . topiramate (TOPAMAX) 50 MG tablet Take 1 tablet (50 mg total) by mouth daily. 90 tablet 3   No current facility-administered medications for this visit.    Allergies  Allergen Reactions  . Lisinopril     cough  . Sulfa Antibiotics     cough  . Sulfonamide Derivatives Other (See Comments)    UNSURE     Review of Systems: All systems reviewed and negative except where noted in HPI.    No results found.  Physical Exam: BP 110/72 (BP Location: Left Arm, Patient Position: Sitting, Cuff Size: Normal)   Pulse 72   Ht 5' 2.25" (1.581 m)   Wt 171 lb 8 oz (77.8 kg)   BMI 31.12 kg/m  Constitutional: Pleasant,well-developed, female in no acute distress. HEENT: Normocephalic and atraumatic. Conjunctivae are normal. No scleral icterus. Neck supple.  Cardiovascular: Normal rate, regular rhythm.  Pulmonary/chest: Effort normal and breath sounds normal. No wheezing, rales or rhonchi. Abdominal: Soft, nondistended, nontender. Bowel sounds active throughout. There are no masses palpable. . Extremities: no signficant edema Lymphadenopathy: No cervical adenopathy noted. Neurological: Alert and oriented to person place and time. Skin: Skin is warm and dry. No rashes noted. Psychiatric: Normal mood  and affect. Behavior is normal.   ASSESSMENT AND PLAN: 70 y/o female with a severe ischemic cardiomyopathy, EF 10%, with recurrent epigastric pain. Prior CT last year with these symptoms showed some inflammatory changes of the distal stomach and duodenal bulb concerning for PUD. She has tested negative for H pylori. She was treated for a period of time with high dose PPI and Carafate and her symptoms resolved, but now on PPI once daily with recurrence of symptoms. She otherwise is due for a surveillance colonoscopy given > 1cm adenoma removed > 5 years ago.   At  this time recommend she increase PPI to BID and add back carafate as this worked for her symptoms in the past. Given her symptoms and prior CT findings I am recommending an EGD, and a colonoscopy for her surveillance exam in light of her adenoma history. I discussed both procedures with her to include risks / benefits, and she is higher risk for anesthesia with her cardiac issues. We will reach out to her cardiologist to see if she is cleared for these, and suspect echocardiogram would be needed prior to scheduling this at the hospital. We will await to her back from cardiology to determine her clearance and possible echo. She agreed with the plan.   Esko Cellar, MD [facility] Pager [phone]

## 2016-03-20 NOTE — Patient Instructions (Signed)
We have sent the following medications to your pharmacy for you to pick up at your convenience: Nexium 40 mg twice daily and Carafate suspension.  You have been scheduled for an endoscopy and colonoscopy. Please follow the written instructions given to you at your visit today. Please pick up your prep supplies at the pharmacy within the next 1-3 days. If you use inhalers (even only as needed), please bring them with you on the day of your procedure.

## 2016-03-20 NOTE — Telephone Encounter (Signed)
Yes she needs an echo and FU visit before undergoing EGD/colonoscopy - she will be at high risk regardless as her LVEF is severely depressed. Will arrange cards eval.

## 2016-03-20 NOTE — Telephone Encounter (Signed)
  03/20/2016   RE: [name] DOB: Dec 01, 1945 MRN: [id number]   Dear Dr. Burt Knack,    We have scheduled the above patient for an endoscopic procedure. Our records show that she is on anticoagulation therapy.   We would like cardiac clearance for the above patient. Her echo was done over a year ago. Does she warrant another echo or follow visit? She is scheduled for Endoscopy/ Colonoscopy on 05/27/16.  Please route your answer to Marlon Pel, CMA  Sincerely,    Marlon Pel

## 2016-03-21 NOTE — Telephone Encounter (Signed)
Spoke with patient and informed her that she will need a follow up with Cardiology and another echo before her procedures in October. Informed her per Dr. Burt Knack they will contact patient and set this up. Told patient if she does not hear from them about an appointment, she needs to contact there office.

## 2016-03-21 NOTE — Telephone Encounter (Signed)
Thanks Estill Bamberg, I suspected this would be the case. We will await the echocardiogram and see the result. Depending on this result, will determine if and when we proceed. Can you please let the patient know? Thanks

## 2016-03-24 NOTE — Telephone Encounter (Signed)
Pt scheduled for echo on 03/27/16 and follow-up appointment with Dr Burt Knack 04/04/16. Pt aware of appointments.

## 2016-03-27 ENCOUNTER — Other Ambulatory Visit: Payer: Self-pay

## 2016-03-27 ENCOUNTER — Ambulatory Visit (HOSPITAL_COMMUNITY): Payer: Medicare Other | Attending: Cardiology

## 2016-03-27 DIAGNOSIS — E785 Hyperlipidemia, unspecified: Secondary | ICD-10-CM | POA: Diagnosis not present

## 2016-03-27 DIAGNOSIS — I255 Ischemic cardiomyopathy: Secondary | ICD-10-CM | POA: Diagnosis not present

## 2016-03-27 DIAGNOSIS — I2510 Atherosclerotic heart disease of native coronary artery without angina pectoris: Secondary | ICD-10-CM | POA: Insufficient documentation

## 2016-03-27 DIAGNOSIS — I422 Other hypertrophic cardiomyopathy: Secondary | ICD-10-CM | POA: Diagnosis not present

## 2016-03-27 DIAGNOSIS — I340 Nonrheumatic mitral (valve) insufficiency: Secondary | ICD-10-CM | POA: Diagnosis not present

## 2016-03-27 DIAGNOSIS — I5022 Chronic systolic (congestive) heart failure: Secondary | ICD-10-CM | POA: Diagnosis not present

## 2016-03-27 DIAGNOSIS — I110 Hypertensive heart disease with heart failure: Secondary | ICD-10-CM | POA: Insufficient documentation

## 2016-03-27 MED ORDER — PERFLUTREN LIPID MICROSPHERE
1.0000 mL | INTRAVENOUS | Status: AC | PRN
  Administered 2016-03-27: 2 mL via INTRAVENOUS

## 2016-04-02 ENCOUNTER — Encounter: Payer: Self-pay | Admitting: Cardiovascular Disease

## 2016-04-04 ENCOUNTER — Ambulatory Visit (INDEPENDENT_AMBULATORY_CARE_PROVIDER_SITE_OTHER): Payer: Medicare Other | Admitting: Cardiovascular Disease

## 2016-04-04 ENCOUNTER — Encounter: Payer: Self-pay | Admitting: Cardiovascular Disease

## 2016-04-04 VITALS — BP 114/70 | HR 92 | Ht 62.0 in | Wt 171.8 lb

## 2016-04-04 DIAGNOSIS — I5022 Chronic systolic (congestive) heart failure: Secondary | ICD-10-CM

## 2016-04-04 NOTE — Patient Instructions (Signed)
Medication Instructions: Your physician recommends that you continue on your current medications as directed. Please refer to the Current Medication list given to you today.   Labwork: Your physician recommends that you return for lab work in: 4 months--prior to appt with Truitt Merle, NP--BNP, BMP   Testing/Procedures: none  Follow-Up: Your physician recommends that you schedule a follow-up appointment in: 4 months with Truitt Merle, NP  Patient is also due for follow up with EP this fall.  Please schedule this appt for patient.   Any Other Special Instructions Will Be Listed Below (If Applicable).     If you need a refill on your cardiac medications before your next appointment, please call your pharmacy.

## 2016-04-04 NOTE — Progress Notes (Signed)
Cardiology Office Note Date:  04/04/2016   ID:  EVYNN BOUTELLE, DOB June 23, 1946, MRN [id number]  PCP:  Leamon Arnt, MD  Cardiologist:  [name], MD    Chief Complaint  Patient presents with  . Coronary Artery Disease     History of Present Illness: [name] is a 70 y.o. female who presents for follow-up evaluation. The patient has a history of coronary disease with anterior wall MI. She's had multiple LAD interventions and ultimately she had stent thrombosis in 2011. She's had severe residual left ventricular systolic dysfunction and has undergone ICD implantation. She's been maintained only on low-dose aspirin because of GI bleeding on dual antiplatelet therapy. She was seen by Dr Haroldine Laws in the [facility] who felt she was not a candidate for advanced therapies because of memory problems and COPD.  Also has been followed for an aortic pseudoaneurysm of the distal arch by Dr Prescott Gum - this is felt to be stable by serial CT imaging and the patient has been asymptomatic in that regard.   Reports not sleeping well at night - woke up at 3 am today. She's sedentary - reports DOE with carrying anything even a short distance. Denies orthopnea or PND. No chest pain. No edema. No presyncope or frank syncope. The patient does not smoke cigarettes but states that she is around a lot of secondhand smoke.  Past Medical History:  Diagnosis Date  . Aortic arch pseudoaneurysm (HCC)    a. followed by Dr. Prescott Gum.  . Asthma   . Benign neoplasm of colon   . CAD (coronary artery disease) 02/2009   a. anterior STEMI rx with BMS to prox LAD in 02/2009. b. ISR s/p PTCA 06/2009. c. ISR s/p thrombectomy & PTCA 03/2010 due to late stent thrombosis.   . Chronic renal insufficiency   . Chronic systolic CHF (congestive heart failure) ([place])    a. s/p ST. Jude ICD 2013.  . Colon polyp   . COPD (chronic obstructive pulmonary disease) ([place])   . Diverticulosis   . GI bleed    a. h/o GIB on DAPT, now on ASA only.  Marland Kitchen Headache(784.0)   . Hyperlipemia   . Hypertension   . Irritable bowel syndrome   . Ischemic cardiomyopathy    EF 10-15%  . Memory loss   . Myocardial infarct ([place])   . Pulmonary embolism ([place]) 2011  . Unspecified mastoiditis     Past Surgical History:  Procedure Laterality Date  . ANGIOPLASTY  07/02/09, 04/01/10  . BILATERAL SALPINGOOPHORECTOMY    . CAD( bare metal stent)  8/10  . CERVICAL SPINE SURGERY  08/08  . IMPLANTABLE CARDIOVERTER DEFIBRILLATOR IMPLANT N/A 08/05/2011   Primary prevention SJM ICD implanted,  Analyze ST study patient  . INNER EAR SURGERY     left x 17  . [place] SURGERY  08/09  . TOTAL ABDOMINAL HYSTERECTOMY      Current Outpatient Prescriptions  Medication Sig Dispense Refill  . albuterol (PROAIR HFA) 108 (90 BASE) MCG/ACT inhaler Inhale 2 puffs into the lungs every 6 (six) hours as needed. For shortness of breath    . amitriptyline (ELAVIL) 50 MG tablet Take 50 mg by mouth at bedtime.    Marland Kitchen aspirin 81 MG tablet Take 81 mg by mouth every morning.     . bisoprolol (ZEBETA) 5 MG tablet TAKE 1 TABLET BY MOUTH DAILY. 90 tablet 3  . CRESTOR 20 MG tablet TAKE 1 TABLET BY MOUTH  AT BEDTIME. 30 tablet 11  . ENTRESTO 24-26 MG TAKE 1 TABLET BY MOUTH TWICE DAILY 60 tablet 10  . esomeprazole (NEXIUM) 40 MG capsule Take 1 capsule (40 mg total) by mouth 2 (two) times daily before a meal. 60 capsule 11  . Fluticasone-Salmeterol (ADVAIR) 250-50 MCG/DOSE AEPB Inhale 1 puff into the lungs 2 (two) times daily.    Marland Kitchen ibandronate (BONIVA) 150 MG tablet Take 150 mg by mouth every 30 (thirty) days. Take in the morning with a full glass of water, on an empty stomach, and do not take anything else by mouth or lie down for the next 30 min.    . polyethylene glycol powder (GLYCOLAX/MIRALAX) powder Take 1 Container by mouth daily as needed for mild constipation.   1  . sucralfate (CARAFATE) 1 GM/10ML suspension Take 10 mLs (1 g total) by mouth  every 8 (eight) hours. 420 mL 1  . topiramate (TOPAMAX) 50 MG tablet Take 1 tablet (50 mg total) by mouth daily. 90 tablet 3   No current facility-administered medications for this visit.     Allergies:   Lisinopril; Sulfa antibiotics; and Sulfonamide derivatives   Social History:  The patient  reports that she quit smoking about 6 years ago. Her smoking use included Cigarettes. She smoked 0.25 packs per day. She has never used smokeless tobacco. She reports that she does not drink alcohol or use drugs.   Family History:  The patient's  family history includes Bladder Cancer in her brother; Breast cancer in her cousin; Colon cancer in her brother and mother.    ROS:  Please see the history of present illness.  Otherwise, review of systems is positive for insomnia, shortness of breath, abdominal pain, back pain, dizziness, easy bruising, excessive sweating, constipation, headaches.  All other systems are reviewed and negative.    PHYSICAL EXAM: VS:  BP 114/70   Pulse 92   Ht 5\' 2"  (1.575 m)   Wt 77.9 kg (171 lb 12.8 oz)   BMI 31.42 kg/m  , BMI Body mass index is 31.42 kg/m. GEN: Well nourished, well developed, in no acute distress  HEENT: normal  Neck: no JVD, no masses. No carotid bruits Cardiac: RRR without murmur or gallop                Respiratory:  clear to auscultation bilaterally, normal work of breathing GI: soft, nontender, nondistended, + BS MS: no deformity or atrophy  Ext: no pretibial edema, pedal pulses 2+= bilaterally Skin: warm and dry, no rash Neuro:  Strength and sensation are intact Psych: euthymic mood, full affect  EKG:  EKG is ordered today. The ekg ordered today shows NSR 92 bpm, PAC's, LAFB  Recent Labs: 08/30/2015: TSH 4.499 01/15/2016: ALT 11; Brain Natriuretic Peptide 105.4; BUN 29; Creat 1.56; Hemoglobin 12.9; Platelets 216; Potassium 5.1; Sodium 143   Lipid Panel     Component Value Date/Time   CHOL 140 01/15/2016 1345   TRIG 224 (H) 01/15/2016  1345   HDL 52 01/15/2016 1345   CHOLHDL 2.7 01/15/2016 1345   VLDL 45 (H) 01/15/2016 1345   LDLCALC 43 01/15/2016 1345      Wt Readings from Last 3 Encounters:  04/04/16 77.9 kg (171 lb 12.8 oz)  03/20/16 77.8 kg (171 lb 8 oz)  01/15/16 77 kg (169 lb 12.8 oz)     Cardiac Studies Reviewed: Echo 03-27-2016: Study Conclusions  - Procedure narrative: Transthoracic echocardiography. Image   quality was suboptimal. Intravenous contrast (Definity) was  administered. - Left ventricle: The cavity size was mildly dilated. Wall   thickness was normal. Systolic function was severely reduced. The   estimated ejection fraction was in the range of 20% to 25%.   Diffuse hypokinesis with sparing of the basal lateral wall.   Doppler parameters are consistent with abnormal left ventricular   relaxation (grade 1 diastolic dysfunction). - Aortic valve: There was no stenosis. - Mitral valve: There was trivial regurgitation. - Right ventricle: The cavity size was normal. Pacer wire or   catheter noted in right ventricle. Systolic function was normal. - Pulmonary arteries: No complete TR doppler jet so unable to   estimate PA systolic pressure. - Inferior vena cava: The vessel was normal in size. The   respirophasic diameter changes were in the normal range (>= 50%),   consistent with normal central venous pressure.  Impressions:  - Mildly dilated LV with EF 20-25%. Diffuse hypokinesis with   sparing of the basal lateral wall. Normal RV size and systolic   function. No significant valvular abnormalities.  ASSESSMENT AND PLAN: 1.   Chronic systolic heart failure, New York Heart Association functional class II: Tolerating combination of Entresto and bisoprolol. No evidence of volume excess or decompensated heart failure on examination. Symptoms appear very stable. I think it is reasonable for her to undergo colonoscopy/endoscopy as needed. She is at moderate risk of complications related to her  cardiomyopathy but symptoms are very stable and I don't think there are any interventions that will further mitigate her risk.   2. Coronary artery disease, native vessel, with old MI: no recurrence of angina. Stable on current Rx.   3. Severe ischemic cardiomyopathy , status post ICD: followed by EP team. No ICD discharges. LVEF improved on recent echo from 10-15% now 20-25%. No valvular pathology.  4. Thoracic aortic pseudoaneurysm: followed by Dr. Prescott Gum with serial CT scans.  5. Hyperlipidemia: treated by PCP. On crestor.   Current medicines are reviewed with the patient today.  The patient does not have concerns regarding medicines.  Labs/ tests ordered today include:   Orders Placed This Encounter  Procedures  . Basic Metabolic Panel (BMET)  . B Nat Peptide  . EKG 12-Lead    Disposition:   FU 4 months Truitt Merle, NP.  Deatra James, MD  04/04/2016 10:05 AM    [address], [place], [place]  [address] Phone: [phone]; Fax: [phone]

## 2016-04-07 ENCOUNTER — Telehealth: Payer: Self-pay | Admitting: Cardiovascular Disease

## 2016-04-07 NOTE — Telephone Encounter (Signed)
I spoke with the pt's son Chrissie Noa and he called to ask about the pt's risk with having a colonoscopy/endoscopy.  I made him aware that Dr Burt Knack listed the pt as moderate risk for complications.

## 2016-04-07 NOTE — Telephone Encounter (Signed)
New Message  Pt call requesting the RN speak with Dr. Burt Knack and ask him to call pt son to speak with him about pts health. Pt states son does not feel pt asks enough questions or the correct corrections about her health. Pt agree to statement from son. Please call back to discuss

## 2016-04-24 ENCOUNTER — Encounter (HOSPITAL_COMMUNITY): Payer: Self-pay | Admitting: *Deleted

## 2016-04-28 ENCOUNTER — Telehealth: Payer: Self-pay

## 2016-04-28 NOTE — Telephone Encounter (Signed)
Spoke to patient this a.m and she thought her EGD/colonoscopy was today so she did her prep yesterday. She has not eaten anything today, only drinking water. Sent message to Dr. Havery Moros, he is advising patient to continue clear liquid diet, do half of a Miralax prep in the early a.m tomorrow. Called patient back to give her instructions. She is to take 1/2 bottle (or 7 capfuls) mixed in 32 ounces of gatorade at 4:30 a.m on 04/29/16. Patient states she does not like gatorade. Went over list of clear liquids and patient only will drink water and a diet pepsi. Patient understands about the electrolytes in the gatorade and importance of them.

## 2016-04-29 ENCOUNTER — Encounter (HOSPITAL_COMMUNITY): Payer: Self-pay

## 2016-04-29 ENCOUNTER — Ambulatory Visit (HOSPITAL_COMMUNITY)
Admission: RE | Admit: 2016-04-29 | Discharge: 2016-04-29 | Disposition: A | Discharge status: Home / Self Care | Payer: Medicare Other | Source: Ambulatory Visit | Attending: Gastroenterology | Admitting: Gastroenterology

## 2016-04-29 ENCOUNTER — Ambulatory Visit (HOSPITAL_COMMUNITY): Payer: Medicare Other | Admitting: Anesthesiology

## 2016-04-29 ENCOUNTER — Encounter (HOSPITAL_COMMUNITY)
Admission: RE | Disposition: A | Discharge status: Home / Self Care | Payer: Self-pay | Source: Ambulatory Visit | Attending: Gastroenterology

## 2016-04-29 DIAGNOSIS — I712 Thoracic aortic aneurysm, without rupture: Secondary | ICD-10-CM | POA: Insufficient documentation

## 2016-04-29 DIAGNOSIS — Z86010 Personal history of colonic polyps: Secondary | ICD-10-CM | POA: Diagnosis not present

## 2016-04-29 DIAGNOSIS — Z800 Family history of malignant neoplasm of digestive organs: Secondary | ICD-10-CM | POA: Insufficient documentation

## 2016-04-29 DIAGNOSIS — K589 Irritable bowel syndrome without diarrhea: Secondary | ICD-10-CM | POA: Diagnosis not present

## 2016-04-29 DIAGNOSIS — K5730 Diverticulosis of large intestine without perforation or abscess without bleeding: Secondary | ICD-10-CM | POA: Diagnosis not present

## 2016-04-29 DIAGNOSIS — K269 Duodenal ulcer, unspecified as acute or chronic, without hemorrhage or perforation: Secondary | ICD-10-CM | POA: Diagnosis not present

## 2016-04-29 DIAGNOSIS — K5520 Angiodysplasia of colon without hemorrhage: Secondary | ICD-10-CM | POA: Diagnosis not present

## 2016-04-29 DIAGNOSIS — Z95810 Presence of automatic (implantable) cardiac defibrillator: Secondary | ICD-10-CM | POA: Insufficient documentation

## 2016-04-29 DIAGNOSIS — K210 Gastro-esophageal reflux disease with esophagitis: Secondary | ICD-10-CM | POA: Diagnosis not present

## 2016-04-29 DIAGNOSIS — N189 Chronic kidney disease, unspecified: Secondary | ICD-10-CM | POA: Diagnosis not present

## 2016-04-29 DIAGNOSIS — R413 Other amnesia: Secondary | ICD-10-CM | POA: Diagnosis not present

## 2016-04-29 DIAGNOSIS — I252 Old myocardial infarction: Secondary | ICD-10-CM | POA: Diagnosis not present

## 2016-04-29 DIAGNOSIS — Z803 Family history of malignant neoplasm of breast: Secondary | ICD-10-CM | POA: Insufficient documentation

## 2016-04-29 DIAGNOSIS — I5022 Chronic systolic (congestive) heart failure: Secondary | ICD-10-CM | POA: Diagnosis not present

## 2016-04-29 DIAGNOSIS — E785 Hyperlipidemia, unspecified: Secondary | ICD-10-CM | POA: Diagnosis not present

## 2016-04-29 DIAGNOSIS — H9192 Unspecified hearing loss, left ear: Secondary | ICD-10-CM | POA: Insufficient documentation

## 2016-04-29 DIAGNOSIS — K2950 Unspecified chronic gastritis without bleeding: Secondary | ICD-10-CM | POA: Insufficient documentation

## 2016-04-29 DIAGNOSIS — Z1211 Encounter for screening for malignant neoplasm of colon: Secondary | ICD-10-CM | POA: Diagnosis present

## 2016-04-29 DIAGNOSIS — Z87891 Personal history of nicotine dependence: Secondary | ICD-10-CM | POA: Diagnosis not present

## 2016-04-29 DIAGNOSIS — R1013 Epigastric pain: Secondary | ICD-10-CM

## 2016-04-29 DIAGNOSIS — I130 Hypertensive heart and chronic kidney disease with heart failure and stage 1 through stage 4 chronic kidney disease, or unspecified chronic kidney disease: Secondary | ICD-10-CM | POA: Insufficient documentation

## 2016-04-29 DIAGNOSIS — I2510 Atherosclerotic heart disease of native coronary artery without angina pectoris: Secondary | ICD-10-CM | POA: Diagnosis not present

## 2016-04-29 DIAGNOSIS — Z882 Allergy status to sulfonamides status: Secondary | ICD-10-CM | POA: Insufficient documentation

## 2016-04-29 DIAGNOSIS — K649 Unspecified hemorrhoids: Secondary | ICD-10-CM | POA: Diagnosis not present

## 2016-04-29 DIAGNOSIS — J449 Chronic obstructive pulmonary disease, unspecified: Secondary | ICD-10-CM | POA: Insufficient documentation

## 2016-04-29 DIAGNOSIS — K2970 Gastritis, unspecified, without bleeding: Secondary | ICD-10-CM | POA: Diagnosis not present

## 2016-04-29 DIAGNOSIS — I255 Ischemic cardiomyopathy: Secondary | ICD-10-CM | POA: Diagnosis not present

## 2016-04-29 DIAGNOSIS — Z86711 Personal history of pulmonary embolism: Secondary | ICD-10-CM | POA: Diagnosis not present

## 2016-04-29 DIAGNOSIS — D122 Benign neoplasm of ascending colon: Secondary | ICD-10-CM | POA: Diagnosis not present

## 2016-04-29 DIAGNOSIS — Z955 Presence of coronary angioplasty implant and graft: Secondary | ICD-10-CM | POA: Insufficient documentation

## 2016-04-29 DIAGNOSIS — Z888 Allergy status to other drugs, medicaments and biological substances status: Secondary | ICD-10-CM | POA: Insufficient documentation

## 2016-04-29 DIAGNOSIS — Z8052 Family history of malignant neoplasm of bladder: Secondary | ICD-10-CM | POA: Insufficient documentation

## 2016-04-29 DIAGNOSIS — R109 Unspecified abdominal pain: Secondary | ICD-10-CM

## 2016-04-29 HISTORY — DX: Personal history of other medical treatment: Z92.89

## 2016-04-29 HISTORY — DX: Unspecified hearing loss, unspecified ear: H91.90

## 2016-04-29 HISTORY — DX: Presence of automatic (implantable) cardiac defibrillator: Z95.810

## 2016-04-29 HISTORY — DX: Nausea with vomiting, unspecified: R11.2

## 2016-04-29 HISTORY — DX: Other specified postprocedural states: Z98.890

## 2016-04-29 HISTORY — PX: ESOPHAGOGASTRODUODENOSCOPY (EGD) WITH PROPOFOL: SHX5813

## 2016-04-29 HISTORY — PX: COLONOSCOPY WITH PROPOFOL: SHX5780

## 2016-04-29 SURGERY — ESOPHAGOGASTRODUODENOSCOPY (EGD) WITH PROPOFOL
Anesthesia: Monitor Anesthesia Care

## 2016-04-29 MED ORDER — ONDANSETRON HCL 4 MG/2ML IJ SOLN
INTRAMUSCULAR | Status: AC
  Filled 2016-04-29: qty 2

## 2016-04-29 MED ORDER — LIDOCAINE 2% (20 MG/ML) 5 ML SYRINGE
INTRAMUSCULAR | Status: DC | PRN
  Administered 2016-04-29: 40 mg via INTRAVENOUS

## 2016-04-29 MED ORDER — PHENYLEPHRINE 40 MCG/ML (10ML) SYRINGE FOR IV PUSH (FOR BLOOD PRESSURE SUPPORT)
PREFILLED_SYRINGE | INTRAVENOUS | Status: AC
  Filled 2016-04-29: qty 10

## 2016-04-29 MED ORDER — PROPOFOL 10 MG/ML IV BOLUS
INTRAVENOUS | Status: DC | PRN
  Administered 2016-04-29: 20 mg via INTRAVENOUS

## 2016-04-29 MED ORDER — LACTATED RINGERS IV SOLN
INTRAVENOUS | Status: DC | PRN
  Administered 2016-04-29: 08:00:00 via INTRAVENOUS

## 2016-04-29 MED ORDER — SODIUM CHLORIDE 0.9 % IV SOLN: INTRAVENOUS | Status: DC

## 2016-04-29 MED ORDER — PHENYLEPHRINE 40 MCG/ML (10ML) SYRINGE FOR IV PUSH (FOR BLOOD PRESSURE SUPPORT)
PREFILLED_SYRINGE | INTRAVENOUS | Status: DC | PRN
  Administered 2016-04-29 (×3): 80 ug via INTRAVENOUS

## 2016-04-29 MED ORDER — PROPOFOL 500 MG/50ML IV EMUL
INTRAVENOUS | Status: DC | PRN
  Administered 2016-04-29: 125 ug/kg/min via INTRAVENOUS

## 2016-04-29 MED ORDER — PROPOFOL 10 MG/ML IV BOLUS
INTRAVENOUS | Status: AC
  Filled 2016-04-29: qty 60

## 2016-04-29 MED ORDER — ONDANSETRON HCL 4 MG/2ML IJ SOLN
INTRAMUSCULAR | Status: DC | PRN
  Administered 2016-04-29: 4 mg via INTRAVENOUS

## 2016-04-29 SURGICAL SUPPLY — 25 items

## 2016-04-29 NOTE — Anesthesia Postprocedure Evaluation (Signed)
Anesthesia Post Note  Patient: [name]  Procedure(s) Performed: Procedure(s) (LRB): ESOPHAGOGASTRODUODENOSCOPY (EGD) WITH PROPOFOL (N/A) COLONOSCOPY WITH PROPOFOL (N/A)  Patient location during evaluation: PACU Anesthesia Type: MAC Level of consciousness: awake and alert Pain management: pain level controlled Vital Signs Assessment: post-procedure vital signs reviewed and stable Respiratory status: spontaneous breathing, nonlabored ventilation and respiratory function stable Cardiovascular status: stable and blood pressure returned to baseline Anesthetic complications: no    Last Vitals:  Vitals:   04/29/16 1050 04/29/16 1100  BP: 113/77 116/64  Pulse: 75 72  Resp: 18 17  Temp:      Last Pain:  Vitals:   04/29/16 0820  TempSrc: Oral                 Nilda Simmer

## 2016-04-29 NOTE — Op Note (Signed)
[facility] Patient Name: [name] Procedure Date: 04/29/2016 MRN: [id number] Attending MD: Carlota Raspberry. Havery Moros , MD Date of Birth: [birth year] CSN: [id number] Age: 70 Admit Type: Outpatient Procedure:                Colonoscopy Indications:              High risk colon cancer surveillance: Personal                            history of colonic polyps (adenomatous polyp 5                            years ago) Providers:                Remo Lipps P. Havery Moros, MD, Kingsley Plan, RN,                            Corliss Parish, Technician Referring MD:              Medicines:                Monitored Anesthesia Care Complications:            No immediate complications. Estimated blood loss:                            Minimal. Estimated Blood Loss:     Estimated blood loss was minimal. Procedure:                Pre-Anesthesia Assessment:                           - Prior to the procedure, a History and Physical                            was performed, and patient medications and                            allergies were reviewed. The patient's tolerance of                            previous anesthesia was also reviewed. The risks                            and benefits of the procedure and the sedation                            options and risks were discussed with the patient.                            All questions were answered, and informed consent                            was obtained. Prior Anticoagulants: The patient has                            taken aspirin, last dose was  1 day prior to                            procedure. ASA Grade Assessment: III - A patient                            with severe systemic disease. After reviewing the                            risks and benefits, the patient was deemed in                            satisfactory condition to undergo the procedure.                           After obtaining informed consent, the  colonoscope                            was passed under direct vision. Throughout the                            procedure, the patient's blood pressure, pulse, and                            oxygen saturations were monitored continuously. The                            EC-[id number] ([id number]) scope was introduced through                            the anus and advanced to the the cecum, identified                            by appendiceal orifice and ileocecal valve. The                            colonoscopy was performed without difficulty. The                            patient tolerated the procedure well. The quality                            of the bowel preparation was adequate. The                            ileocecal valve, appendiceal orifice, and rectum                            were photographed. Scope In: 10:12:19 AM Scope Out: 10:30:31 AM Scope Withdrawal Time: 0 hours 10 minutes 7 seconds  Total Procedure Duration: 0 hours 18 minutes 12 seconds  Findings:      The perianal and digital rectal examinations were normal.      A 3 mm polyp was found in the ascending colon. The polyp  was sessile.       The polyp was removed with a cold biopsy forceps. Resection and       retrieval were complete.      A single small angiodysplastic lesion was found in the ascending colon.      A few small-mouthed diverticula were found in the left colon.      Non-bleeding internal hemorrhoids were found during retroflexion.      The exam was otherwise without abnormality. The bowel prep was initially       fair but following time spent with lavage, adequate views were obtained. Impression:               - One 3 mm polyp in the ascending colon, removed                            with a cold biopsy forceps. Resected and retrieved.                           - A single colonic angiodysplastic lesion.                           - Diverticulosis in the left colon.                           -  Non-bleeding internal hemorrhoids.                           - The examination was otherwise normal. Moderate Sedation:      No moderate sedation, case performed with MAC Recommendation:           - Patient has a contact number available for                            emergencies. The signs and symptoms of potential                            delayed complications were discussed with the                            patient. Return to normal activities tomorrow.                            Written discharge instructions were provided to the                            patient.                           - Resume previous diet.                           - Continue present medications.                           - Await pathology results.                           -  Repeat colonoscopy is recommended for                            surveillance. The colonoscopy date will be                            determined after pathology results from today's                            exam become available for review. Procedure Code(s):        --- Professional ---                           [phone], Colonoscopy, flexible; with biopsy, single                            or multiple Diagnosis Code(s):        --- Professional ---                           Z86.010, Personal history of colonic polyps                           D12.2, Benign neoplasm of ascending colon                           K55.20, Angiodysplasia of colon without hemorrhage                           K64.8, Other hemorrhoids                           K57.30, Diverticulosis of large intestine without                            perforation or abscess without bleeding CPT copyright 2016 American Medical Association. All rights reserved. The codes documented in this report are preliminary and upon coder review may  be revised to meet current compliance requirements. Remo Lipps P. Odessa Morren, MD 04/29/2016 10:39:16 AM This report has been signed  electronically. Number of Addenda: 0

## 2016-04-29 NOTE — Discharge Instructions (Signed)
YOU HAD AN ENDOSCOPIC PROCEDURE TODAY: Refer to the procedure report and other information in the discharge instructions given to you for any specific questions about what was found during the examination. If this information does not answer your questions, please call [place] office at [phone] to clarify.  ° °YOU SHOULD EXPECT: Some feelings of bloating in the abdomen. Passage of more gas than usual. Walking can help get rid of the air that was put into your GI tract during the procedure and reduce the bloating. If you had a lower endoscopy (such as a colonoscopy or flexible sigmoidoscopy) you may notice spotting of blood in your stool or on the toilet paper. Some abdominal soreness may be present for a day or two, also. ° °DIET: Your first meal following the procedure should be a light meal and then it is ok to progress to your normal diet. A half-sandwich or bowl of soup is an example of a good first meal. Heavy or fried foods are harder to digest and may make you feel nauseous or bloated. Drink plenty of fluids but you should avoid alcoholic beverages for 24 hours. If you had a esophageal dilation, please see attached instructions for diet.   ° °ACTIVITY: Your care partner should take you home directly after the procedure. You should plan to take it easy, moving slowly for the rest of the day. You can resume normal activity the day after the procedure however YOU SHOULD NOT DRIVE, use power tools, machinery or perform tasks that involve climbing or major physical exertion for 24 hours (because of the sedation medicines used during the test).  ° °SYMPTOMS TO REPORT IMMEDIATELY: °A gastroenterologist can be reached at any hour. Please call [phone]  for any of the following symptoms:  °Following lower endoscopy (colonoscopy, flexible sigmoidoscopy) °Excessive amounts of blood in the stool  °Significant tenderness, worsening of abdominal pains  °Swelling of the abdomen that is new, acute  °Fever of 100° or  higher  °Following upper endoscopy (EGD, EUS, ERCP, esophageal dilation) °Vomiting of blood or coffee ground material  °New, significant abdominal pain  °New, significant chest pain or pain under the shoulder blades  °Painful or persistently difficult swallowing  °New shortness of breath  °Black, tarry-looking or red, bloody stools ° °FOLLOW UP:  °If any biopsies were taken you will be contacted by phone or by letter within the next 1-3 weeks. Call [phone]  if you have not heard about the biopsies in 3 weeks.  °Please also call with any specific questions about appointments or follow up tests. ° °

## 2016-04-29 NOTE — Op Note (Signed)
[facility] Patient Name: [name] Procedure Date: 04/29/2016 MRN: [id number] Attending MD: Carlota Raspberry. Havery Moros , MD Date of Birth: 19-Nov-1945 CSN: [id number] Age: 70 Admit Type: Outpatient Procedure:                Upper GI endoscopy Indications:              Epigastric abdominal pain, Abnormal CT of the GI                            tract, improved on PPI Providers:                Carlota Raspberry. Havery Moros, MD, Kingsley Plan, RN,                            Corliss Parish, Technician Referring MD:              Medicines:                Monitored Anesthesia Care Complications:            No immediate complications. Estimated blood loss:                            Minimal. Estimated Blood Loss:     Estimated blood loss was minimal. Procedure:                Pre-Anesthesia Assessment:                           - Prior to the procedure, a History and Physical                            was performed, and patient medications and                            allergies were reviewed. The patient's tolerance of                            previous anesthesia was also reviewed. The risks                            and benefits of the procedure and the sedation                            options and risks were discussed with the patient.                            All questions were answered, and informed consent                            was obtained. Prior Anticoagulants: The patient has                            taken aspirin, last dose was 1 day prior to  procedure. ASA Grade Assessment: III - A patient                            with severe systemic disease. After reviewing the                            risks and benefits, the patient was deemed in                            satisfactory condition to undergo the procedure.                           After obtaining informed consent, the endoscope was                            passed under  direct vision. Throughout the                            procedure, the patient's blood pressure, pulse, and                            oxygen saturations were monitored continuously. The                            Endoscope was introduced through the mouth, and                            advanced to the second part of duodenum. The upper                            GI endoscopy was accomplished without difficulty.                            The patient tolerated the procedure well. Scope In: Scope Out: Findings:      Esophagogastric landmarks were identified: the Z-line was found at 38       cm, the gastroesophageal junction was found at 38 cm and the upper       extent of the gastric folds was found at 38 cm from the incisors.      LA Grade C esophagitis was found at the GEJ along with some white       plaques. Biopsies were taken with a cold forceps for histology to rule       out candidiasis and ensure no dysplastic changes.      The exam of the esophagus was otherwise normal.      Abnormal mucosal changes were found in the gastric antrum. There was an       area of polypoid tissue vs. mass in the distal antrum in the 3 o'clock       position relative to the pylorus. The area was at least a few cm in       size. Unclear if this represents benign polypoid hyperplastic tissue       versus malignancy. Biopsies were taken with a cold forceps for       histology. There was additionally some nodular mucosa  at the 9 o'clock       position which was biopsied.      Inflammation characterized by adherent blood and erythema was found in       the gastric antrum without focal ulceration. Biopsies were taken from       the antrum and body with a cold forceps for Helicobacter pylori testing.      The exam of the stomach was otherwise normal.      Multiple non-bleeding superficial duodenal ulcers with no stigmata of       bleeding were found in the duodenal bulb and in the second portion of        the duodenum. The largest lesion was 6 mm in largest dimension. Biopsies       were taken with a cold forceps for histology. Impression:               - Esophagogasric landmarks identified.                           - LA Grade C reflux esophagitis. Biopsied to ensure                            no dysplastic changes, rule out Candida                           -Mucosal changes in the antrum as outlined above                            Biopsied.                           - Gastritis. Biopsied.                           - Multiple non-bleeding duodenal ulcers with no                            stigmata of bleeding. Biopsied. Moderate Sedation:      No moderate sedation, case performed with MAC Recommendation:           - Patient has a contact number available for                            emergencies. The signs and symptoms of potential                            delayed complications were discussed with the                            patient. Return to normal activities tomorrow.                            Written discharge instructions were provided to the                            patient.                           -  Resume previous diet.                           - Continue present medications.                           - Ensure you are taking nexium 40mg  twice daily                            along with liquid carafate every 6 hours                           - No ibuprofen, naproxen, or other non-steroidal                            anti-inflammatory drugs                           - Await pathology results. Procedure Code(s):        --- Professional ---                           [phone], Esophagogastroduodenoscopy, flexible,                            transoral; with biopsy, single or multiple Diagnosis Code(s):        --- Professional ---                           K21.0, Gastro-esophageal reflux disease with                            esophagitis                           K31.89,  Other diseases of stomach and duodenum                           K29.70, Gastritis, unspecified, without bleeding                           K26.9, Duodenal ulcer, unspecified as acute or                            chronic, without hemorrhage or perforation                           R10.13, Epigastric pain                           R93.3, Abnormal findings on diagnostic imaging of                            other parts of digestive tract CPT copyright 2016 American Medical Association. All rights reserved. The codes documented in this report are preliminary and upon coder review may  be revised to meet current compliance requirements. Remo Lipps P. Benard Minturn, MD 04/29/2016 10:57:45 AM  This report has been signed electronically. Number of Addenda: 0

## 2016-04-29 NOTE — H&P (Signed)
HPI :  70 y/o female here for EGD and colonoscopy. EGD done for workup of abdominal pain, which is PPI responsive, with prior CT changes of the stomach. Due for surveillance colonoscopy given history of polyps. She has a significant cardiac history, seen by cardiology for pre-operative assessment. She feels well without complaints today.   Past Medical History:  Diagnosis Date  . AICD (automatic cardioverter/defibrillator) present    Dr.Allred follows  . Aortic arch pseudoaneurysm (HCC)    a. followed by Dr. Prescott Gum.  . Asthma   . Benign neoplasm of colon   . CAD (coronary artery disease) 02/2009   a. anterior STEMI rx with BMS to prox LAD in 02/2009. b. ISR s/p PTCA 06/2009. c. ISR s/p thrombectomy & PTCA 03/2010 due to late stent thrombosis.   . Chronic renal insufficiency   . Chronic systolic CHF (congestive heart failure) ([place])    a. s/p ST. Jude ICD 2013.  . Colon polyp   . COPD (chronic obstructive pulmonary disease) ([place])   . Diverticulosis   . GI bleed    a. h/o GIB on DAPT, now on ASA only.  Marland Kitchen Headache(784.0)   . Hearing loss    left ear  . Hyperlipemia   . Hypertension   . Irritable bowel syndrome   . Ischemic cardiomyopathy    EF 10-15%  . Memory loss   . Myocardial infarct    Dr. [place] Nickel   . PONV (postoperative nausea and vomiting)   . Pulmonary embolism ([place]) 2011  . Transfusion history    ?'12 or '13  . Unspecified mastoiditis      Past Surgical History:  Procedure Laterality Date  . ANGIOPLASTY  07/02/09, 04/01/10  . BILATERAL SALPINGOOPHORECTOMY    . CAD( bare metal stent)  8/10  . CERVICAL SPINE SURGERY  08/08  . IMPLANTABLE CARDIOVERTER DEFIBRILLATOR IMPLANT N/A 08/05/2011   Primary prevention SJM ICD implanted,  Analyze ST study patient  . INNER EAR SURGERY     left x 17  . LUMBAR DISC SURGERY  02/2008   fusion  . TOTAL ABDOMINAL HYSTERECTOMY     Family History  Problem Relation Age of Onset  . Colon cancer Mother   . Colon cancer  Brother   . Bladder Cancer Brother   . Breast cancer Cousin    Social History  Substance Use Topics  . Smoking status: Former Smoker    Packs/day: 0.25    Types: Cigarettes    Quit date: 04/27/2009  . Smokeless tobacco: Never Used  . Alcohol use No   Current Facility-Administered Medications  Medication Dose Route Frequency Provider Last Rate Last Dose  . 0.9 %  sodium chloride infusion   Intravenous Continuous [name], MD       Allergies  Allergen Reactions  . Lisinopril     cough  . Sulfa Antibiotics     cough  . Sulfonamide Derivatives Other (See Comments)    UNSURE     Review of Systems: All systems reviewed and negative except where noted in HPI.    Lab Results  Component Value Date   WBC 6.9 01/15/2016   HGB 12.9 01/15/2016   HCT 38.9 01/15/2016   MCV 98.5 01/15/2016   PLT 216 01/15/2016   Lab Results  Component Value Date   CREATININE 1.56 (H) 01/15/2016   BUN 29 (H) 01/15/2016   NA 143 01/15/2016   K 5.1 01/15/2016   CL 110 01/15/2016   CO2 21 01/15/2016  Lab Results  Component Value Date   ALT 11 01/15/2016   AST 13 01/15/2016   ALKPHOS 83 01/15/2016   BILITOT 0.3 01/15/2016      Physical Exam: BP (!) 145/88   Pulse 84   Temp 98.1 F (36.7 C) (Oral)   Resp 19   Ht 5\' 2"  (1.575 m)   Wt 171 lb (77.6 kg)   SpO2 99%   BMI 31.28 kg/m  Constitutional: Pleasant,well-developed, female in no acute distress. Cardiovascular: Normal rate, regular rhythm.  Pulmonary/chest: Effort normal and breath sounds normal. No wheezing, rales or rhonchi. Abdominal: Soft, nondistended, nontender. There are no masses palpable. . Extremities: no edema  ASSESSMENT AND PLAN: 70 y/o female with intermittent epigastric pain, responsive to PPI, with CT changes of the stomach, here for EGD to further evaluate and for colonoscopy for surveillance of history of polyps. Significant cardiac history, seen by cardiology for pre-operative assessment. I  have discussed risks / benefits of anesthesia and endoscopy with her, and she wishes to proceed. Further recommendations pending the results.    Vesta Cellar, MD [facility] Pager [phone]

## 2016-04-29 NOTE — Anesthesia Preprocedure Evaluation (Addendum)
Anesthesia Evaluation  Patient identified by MRN, date of birth, ID band Patient awake    Reviewed: Allergy & Precautions, NPO status , Patient's Chart, lab work & pertinent test results, reviewed documented beta blocker date and time   History of Anesthesia Complications (+) PONV and history of anesthetic complications  Airway Mallampati: II  TM Distance: >3 FB Neck ROM: Full    Dental  (+) Upper Dentures, Dental Advisory Given   Pulmonary neg shortness of breath, asthma , neg sleep apnea, COPD,  COPD inhaler, neg recent URI, former smoker, PE (2011)   Pulmonary exam normal breath sounds clear to auscultation       Cardiovascular Exercise Tolerance: Poor hypertension, Pt. on medications and Pt. on home beta blockers + CAD, + Past MI (STEMI 2010), + Cardiac Stents (BMS to LAD in 02/2009) and +CHF  (-) CABG and (-) PND + dysrhythmias (PACs, incomplete RBBB) + Cardiac Defibrillator (St. Jude)  Rhythm:Regular Rate:Normal  HLD, aortic arch pseudoaneurysm  TTE 03/27/2016: Impressions: - Mildly dilated LV with EF 20-25%. Diffuse hypokinesis with sparing of the basal lateral wall. Normal RV size and systolic function. No significant valvular abnormalities.  Myocardial Perfusion Scan 08/08/2013: large scar without ischemia   Neuro/Psych  Headaches, neg Seizures Memory loss, DDD    GI/Hepatic Neg liver ROS, GERD  Medicated and Controlled,IBS, diverticulosis   Endo/Other  negative endocrine ROS  Renal/GU CRFRenal disease     Musculoskeletal  (+) Arthritis ,   Abdominal (+) + obese,   Peds  Hematology  (+) Blood dyscrasia, anemia ,   Anesthesia Other Findings Hearing loss in left ear  Reproductive/Obstetrics                            Anesthesia Physical Anesthesia Plan  ASA: IV  Anesthesia Plan: MAC   Post-op Pain Management:    Induction: Intravenous  Airway Management Planned: Natural  Airway and Nasal Cannula  Additional Equipment:   Intra-op Plan:   Post-operative Plan:   Informed Consent: I have reviewed the patients History and Physical, chart, labs and discussed the procedure including the risks, benefits and alternatives for the proposed anesthesia with the patient or authorized representative who has indicated his/her understanding and acceptance.   Dental advisory given  Plan Discussed with: CRNA  Anesthesia Plan Comments: (Patient has AICD. Per Dr. Rayann Heman, magnet should be placed over device during procedure. Post-op interrogation is not needed.)       Anesthesia Quick Evaluation

## 2016-04-29 NOTE — Interval H&P Note (Signed)
History and Physical Interval Note:  04/29/2016 8:27 AM  [name]  has presented today for surgery, with the diagnosis of abd pain, history of colon polyps, Low EF  The various methods of treatment have been discussed with the patient and family. After consideration of risks, benefits and other options for treatment, the patient has consented to  Procedure(s): ESOPHAGOGASTRODUODENOSCOPY (EGD) WITH PROPOFOL (N/A) COLONOSCOPY WITH PROPOFOL (N/A) as a surgical intervention .  The patient's history has been reviewed, patient examined, no change in status, stable for surgery.  I have reviewed the patient's chart and labs.  Questions were answered to the patient's satisfaction.     Renelda Loma Armbruster

## 2016-04-29 NOTE — Transfer of Care (Signed)
Immediate Anesthesia Transfer of Care Note  Patient: [name]  Procedure(s) Performed: Procedure(s): ESOPHAGOGASTRODUODENOSCOPY (EGD) WITH PROPOFOL (N/A) COLONOSCOPY WITH PROPOFOL (N/A)  Patient Location: PACU  Anesthesia Type:MAC  Level of Consciousness:  sedated, patient cooperative and responds to stimulation  Airway & Oxygen Therapy:Patient Spontanous Breathing and Patient connected to face mask oxgen  Post-op Assessment:  Report given to PACU RN and Post -op Vital signs reviewed and stable  Post vital signs:  Reviewed and stable  Last Vitals:  Vitals:   04/29/16 0820  BP: (!) 145/88  Pulse: 84  Resp: 19  Temp: 97.3 C    Complications: No apparent anesthesia complications

## 2016-04-30 ENCOUNTER — Encounter (HOSPITAL_COMMUNITY): Payer: Self-pay | Admitting: Gastroenterology

## 2016-05-05 ENCOUNTER — Other Ambulatory Visit: Payer: Self-pay

## 2016-05-05 ENCOUNTER — Telehealth: Payer: Self-pay

## 2016-05-05 DIAGNOSIS — R933 Abnormal findings on diagnostic imaging of other parts of digestive tract: Secondary | ICD-10-CM

## 2016-05-05 NOTE — Telephone Encounter (Signed)
Dr Havery Moros has discussed with the pt and she is aware of the EUS

## 2016-05-05 NOTE — Telephone Encounter (Signed)
-----   Message from Milus Banister, MD sent at 05/02/2016 12:45 PM EDT ----- Regarding: RE: question about possible EUS [name] Interesting EGD images.  I think EUS will be helpful to check for deeper mass in the stomach.  I'll have [name] get in touch with her to schedule after you let her know we'll be calling.  Thanks  [name], She needs upper EUS, radial +/- linear, ++MAC, next available EUS Thursday for abnormal stomach.  Thanks  DJ  ----- Message ----- From: [name], MD Sent: 05/01/2016   6:08 PM To: Milus Banister, MD Subject: question about possible EUS                    [name], This is kind of a weird case, wanted to ask if you thought EUS would be useful at all.   Epigastric pain, abnormal CT with thickening of antrum / duodenum. She's got what I think is a very large hyperplastic gastric polyp along the antrum, all superficial biopsies suggest hyperplastic, but it's pretty big and not sure if there is something there deeper to it. She's also got significant gastritis, esophagitis, and several duodenal ulcers on high dose PPI, going to work her up for [name]. She's negative for H pylori.  I was just curious what you thought of an EUS to see if this would be helpful to ensure nothing deeper. She's got some significant cardiac comorbidities but did well with the initial EGD. If this is a true hyperplastic polyp I think given the size it would warrant EMR attempt  [name]

## 2016-05-05 NOTE — Telephone Encounter (Signed)
Left message on machine to call back  

## 2016-05-05 NOTE — Telephone Encounter (Signed)
EUS scheduled, pt instructed and medications reviewed.  Patient instructions mailed to home.  Patient to call with any questions or concerns.  The pt states she has not heard from our office about the pathology results.  I explained why the EUS was being done.  She would like a call from Dr Doyne Keel nurse to discuss further   Almyra Free, have you heard from Dr Havery Moros about this pt result?

## 2016-05-06 ENCOUNTER — Other Ambulatory Visit: Payer: Self-pay

## 2016-05-06 ENCOUNTER — Telehealth: Payer: Self-pay

## 2016-05-06 DIAGNOSIS — K219 Gastro-esophageal reflux disease without esophagitis: Secondary | ICD-10-CM

## 2016-05-06 MED ORDER — DEXLANSOPRAZOLE 60 MG PO CPDR: 60.0000 mg | DELAYED_RELEASE_CAPSULE | Freq: Two times a day (BID) | ORAL | 2 refills | Status: DC

## 2016-05-06 MED ORDER — RANITIDINE HCL 150 MG PO TABS: 150.0000 mg | ORAL_TABLET | Freq: Two times a day (BID) | ORAL | 2 refills | Status: DC

## 2016-05-06 NOTE — Telephone Encounter (Signed)
[facility] sent fax stating that Dexilant 60mg  BID requires a Pa. If changed to qd it does not require a Pa. Do you want to keep Rx at 60mg  BID or change to one day dosing?

## 2016-05-06 NOTE — Telephone Encounter (Signed)
Once day dosing is fine for dexilant 60mg . thanks

## 2016-05-07 ENCOUNTER — Other Ambulatory Visit: Payer: Medicare Other

## 2016-05-07 DIAGNOSIS — K219 Gastro-esophageal reflux disease without esophagitis: Secondary | ICD-10-CM

## 2016-05-08 ENCOUNTER — Encounter (HOSPITAL_COMMUNITY): Payer: Self-pay | Admitting: *Deleted

## 2016-05-08 MED ORDER — DEXLANSOPRAZOLE 60 MG PO CPDR: 60.0000 mg | DELAYED_RELEASE_CAPSULE | Freq: Every day | ORAL | 2 refills | Status: DC

## 2016-05-12 LAB — GASTRIN: Gastrin: 51 pg/mL (ref <=100)

## 2016-05-14 ENCOUNTER — Encounter: Payer: Self-pay | Admitting: Internal Medicine

## 2016-05-15 ENCOUNTER — Encounter (HOSPITAL_COMMUNITY)
Admission: RE | Disposition: A | Discharge status: Home / Self Care | Payer: Self-pay | Source: Ambulatory Visit | Attending: Gastroenterology

## 2016-05-15 ENCOUNTER — Ambulatory Visit (HOSPITAL_COMMUNITY): Payer: Medicare Other | Admitting: Anesthesiology

## 2016-05-15 ENCOUNTER — Encounter (HOSPITAL_COMMUNITY): Payer: Self-pay

## 2016-05-15 ENCOUNTER — Ambulatory Visit (HOSPITAL_COMMUNITY)
Admission: RE | Admit: 2016-05-15 | Discharge: 2016-05-15 | Disposition: A | Discharge status: Home / Self Care | Payer: Medicare Other | Source: Ambulatory Visit | Attending: Gastroenterology | Admitting: Gastroenterology

## 2016-05-15 DIAGNOSIS — J449 Chronic obstructive pulmonary disease, unspecified: Secondary | ICD-10-CM | POA: Insufficient documentation

## 2016-05-15 DIAGNOSIS — N189 Chronic kidney disease, unspecified: Secondary | ICD-10-CM | POA: Diagnosis not present

## 2016-05-15 DIAGNOSIS — K589 Irritable bowel syndrome without diarrhea: Secondary | ICD-10-CM | POA: Diagnosis not present

## 2016-05-15 DIAGNOSIS — Z800 Family history of malignant neoplasm of digestive organs: Secondary | ICD-10-CM | POA: Diagnosis not present

## 2016-05-15 DIAGNOSIS — Z87891 Personal history of nicotine dependence: Secondary | ICD-10-CM | POA: Insufficient documentation

## 2016-05-15 DIAGNOSIS — K210 Gastro-esophageal reflux disease with esophagitis: Secondary | ICD-10-CM | POA: Diagnosis not present

## 2016-05-15 DIAGNOSIS — I5022 Chronic systolic (congestive) heart failure: Secondary | ICD-10-CM | POA: Diagnosis not present

## 2016-05-15 DIAGNOSIS — I252 Old myocardial infarction: Secondary | ICD-10-CM | POA: Diagnosis not present

## 2016-05-15 DIAGNOSIS — E785 Hyperlipidemia, unspecified: Secondary | ICD-10-CM | POA: Diagnosis not present

## 2016-05-15 DIAGNOSIS — K317 Polyp of stomach and duodenum: Secondary | ICD-10-CM | POA: Diagnosis not present

## 2016-05-15 DIAGNOSIS — Z86010 Personal history of colonic polyps: Secondary | ICD-10-CM | POA: Diagnosis not present

## 2016-05-15 DIAGNOSIS — Z86711 Personal history of pulmonary embolism: Secondary | ICD-10-CM | POA: Insufficient documentation

## 2016-05-15 DIAGNOSIS — Z95810 Presence of automatic (implantable) cardiac defibrillator: Secondary | ICD-10-CM | POA: Diagnosis not present

## 2016-05-15 DIAGNOSIS — K5790 Diverticulosis of intestine, part unspecified, without perforation or abscess without bleeding: Secondary | ICD-10-CM | POA: Insufficient documentation

## 2016-05-15 DIAGNOSIS — Z803 Family history of malignant neoplasm of breast: Secondary | ICD-10-CM | POA: Insufficient documentation

## 2016-05-15 DIAGNOSIS — K319 Disease of stomach and duodenum, unspecified: Secondary | ICD-10-CM | POA: Diagnosis present

## 2016-05-15 DIAGNOSIS — K269 Duodenal ulcer, unspecified as acute or chronic, without hemorrhage or perforation: Secondary | ICD-10-CM | POA: Diagnosis not present

## 2016-05-15 DIAGNOSIS — I255 Ischemic cardiomyopathy: Secondary | ICD-10-CM | POA: Diagnosis not present

## 2016-05-15 DIAGNOSIS — Z90710 Acquired absence of both cervix and uterus: Secondary | ICD-10-CM | POA: Diagnosis not present

## 2016-05-15 DIAGNOSIS — I712 Thoracic aortic aneurysm, without rupture: Secondary | ICD-10-CM | POA: Insufficient documentation

## 2016-05-15 DIAGNOSIS — I2510 Atherosclerotic heart disease of native coronary artery without angina pectoris: Secondary | ICD-10-CM | POA: Insufficient documentation

## 2016-05-15 DIAGNOSIS — I130 Hypertensive heart and chronic kidney disease with heart failure and stage 1 through stage 4 chronic kidney disease, or unspecified chronic kidney disease: Secondary | ICD-10-CM | POA: Insufficient documentation

## 2016-05-15 DIAGNOSIS — R933 Abnormal findings on diagnostic imaging of other parts of digestive tract: Secondary | ICD-10-CM | POA: Diagnosis not present

## 2016-05-15 DIAGNOSIS — Z955 Presence of coronary angioplasty implant and graft: Secondary | ICD-10-CM | POA: Insufficient documentation

## 2016-05-15 DIAGNOSIS — Z8052 Family history of malignant neoplasm of bladder: Secondary | ICD-10-CM | POA: Insufficient documentation

## 2016-05-15 DIAGNOSIS — K2960 Other gastritis without bleeding: Secondary | ICD-10-CM | POA: Diagnosis not present

## 2016-05-15 HISTORY — PX: EUS: SHX5427

## 2016-05-15 SURGERY — UPPER ENDOSCOPIC ULTRASOUND (EUS) RADIAL
Anesthesia: Monitor Anesthesia Care

## 2016-05-15 MED ORDER — LIDOCAINE 2% (20 MG/ML) 5 ML SYRINGE
INTRAMUSCULAR | Status: AC
  Filled 2016-05-15: qty 5

## 2016-05-15 MED ORDER — PROPOFOL 10 MG/ML IV BOLUS
INTRAVENOUS | Status: AC
  Filled 2016-05-15: qty 40

## 2016-05-15 MED ORDER — PROPOFOL 10 MG/ML IV BOLUS
INTRAVENOUS | Status: DC | PRN
  Administered 2016-05-15 (×4): 20 mg via INTRAVENOUS

## 2016-05-15 MED ORDER — SODIUM CHLORIDE 0.9 % IV SOLN: INTRAVENOUS | Status: DC

## 2016-05-15 MED ORDER — PROPOFOL 500 MG/50ML IV EMUL
INTRAVENOUS | Status: DC | PRN
  Administered 2016-05-15: 140 ug/kg/min via INTRAVENOUS

## 2016-05-15 MED ORDER — FENTANYL CITRATE (PF) 100 MCG/2ML IJ SOLN: 25.0000 ug | INTRAMUSCULAR | Status: DC | PRN

## 2016-05-15 MED ORDER — PROMETHAZINE HCL 25 MG/ML IJ SOLN: 6.2500 mg | INTRAMUSCULAR | Status: DC | PRN

## 2016-05-15 MED ORDER — LACTATED RINGERS IV SOLN
INTRAVENOUS | Status: DC
  Administered 2016-05-15: 1000 mL via INTRAVENOUS

## 2016-05-15 MED ORDER — LIDOCAINE 2% (20 MG/ML) 5 ML SYRINGE
INTRAMUSCULAR | Status: DC | PRN
  Administered 2016-05-15: 100 mg via INTRAVENOUS

## 2016-05-15 NOTE — Anesthesia Postprocedure Evaluation (Signed)
Anesthesia Post Note  Patient: [name]  Procedure(s) Performed: Procedure(s) (LRB): UPPER ENDOSCOPIC ULTRASOUND (EUS) RADIAL (N/A)  Patient location during evaluation: PACU Anesthesia Type: MAC Level of consciousness: awake and alert Pain management: pain level controlled Vital Signs Assessment: post-procedure vital signs reviewed and stable Respiratory status: spontaneous breathing, nonlabored ventilation, respiratory function stable and patient connected to nasal cannula oxygen Cardiovascular status: stable and blood pressure returned to baseline Anesthetic complications: no    Last Vitals:  Vitals:   05/15/16 1240 05/15/16 1245  BP: (!) 150/84 (!) 120/101  Pulse: 60 69  Resp: 16 19  Temp:      Last Pain:  Vitals:   05/15/16 1221  TempSrc: Oral                 Raegan Winders S

## 2016-05-15 NOTE — Discharge Instructions (Signed)

## 2016-05-15 NOTE — Op Note (Signed)
[facility] Patient Name: [name] Procedure Date: 05/15/2016 MRN: [id number] Attending MD: Milus Banister , MD Date of Birth: 1946/01/19 CSN: [id number] Age: 70 Admit Type: Outpatient Procedure:                Upper EUS Indications:              abnormal stomach on recent EGD Dr. Havery Moros;                            duodenal ulcers, nodular hyperplastic antral                            lesions, esophagitis; mucosal biopsies showed no H.                            pylori or sign of neoplasia. Providers:                Milus Banister, MD, Kingsley Plan, RN, Ralene Bathe, Technician, Danley Danker, CRNA Referring MD:             Jolly Mango, MD Medicines:                Monitored Anesthesia Care Complications:            No immediate complications. Estimated blood loss:                            None. Estimated Blood Loss:     Estimated blood loss: none. Procedure:                Pre-Anesthesia Assessment:                           - Prior to the procedure, a History and Physical                            was performed, and patient medications and                            allergies were reviewed. The patient's tolerance of                            previous anesthesia was also reviewed. The risks                            and benefits of the procedure and the sedation                            options and risks were discussed with the patient.                            All questions were answered, and informed consent  was obtained. Prior Anticoagulants: The patient has                            taken no previous anticoagulant or antiplatelet                            agents. ASA Grade Assessment: II - A patient with                            mild systemic disease. After reviewing the risks                            and benefits, the patient was deemed in   satisfactory condition to undergo the procedure.                           After obtaining informed consent, the endoscope was                            passed under direct vision. Throughout the                            procedure, the patient's blood pressure, pulse, and                            oxygen saturations were monitored continuously. The                            [id number] ([id number]) scope was introduced through                            the mouth, and advanced to the second part of                            duodenum. The [id number] ([id number]) scope was                            introduced through the mouth, and advanced to the                            second part of duodenum. The upper EUS was                            accomplished without difficulty. The patient                            tolerated the procedure well. Scope In: Scope Out: Findings:      Endoscopic Finding :      There was mild distal esophagitis (LA grade A), reflux related      There was soft nodularity in the antrum, similar in appearance to EGD       earlier this month. Following EUS evaluation, I used forceps to repeat       biopsy.      The duodenal ulcers/erosion have nearly completely  healed.      Endosonographic Finding :      The nodularity in the gastric antrum was associated with mucosal and       deep mucosal non-specific thickening (4-31mm) without clear mass.      The remaining gastric wall was normal.      No perigastric adenopathy.      Limited views of liver, spleen, pancreas, left lobe of liver were all       normal. Impression:               - The antral nodularity was soft, not associated                            with any deep masses/tumors (this is a mucosal                            process only). This was sampled again with forceps.                           - Very impressive improvement in duodenal                            ulcers/erosions; the bulb and D2 were nearly                             completely normal now.                           - Esophagitis has also healed somewhat.                           - I have low suspicion for underlying neoplasm,                            await final pathology.                           - Continue high dose PPI, this seems to be helping. Moderate Sedation:      N/A- Per Anesthesia Care Recommendation:           - Discharge patient to home (ambulatory).                           - Await path results. Procedure Code(s):        --- Professional ---                           [phone], Esophagogastroduodenoscopy, flexible,                            transoral; with endoscopic ultrasound examination                            limited to the esophagus, stomach or duodenum, and                            adjacent structures  41597, 45, Esophagogastroduodenoscopy, flexible,                            transoral; with biopsy, single or multiple Diagnosis Code(s):        --- Professional ---                           K31.89, Other diseases of stomach and duodenum CPT copyright 2016 American Medical Association. All rights reserved. The codes documented in this report are preliminary and upon coder review may  be revised to meet current compliance requirements. Milus Banister, MD 05/15/2016 12:21:20 PM This report has been signed electronically. Number of Addenda: 0

## 2016-05-15 NOTE — Anesthesia Preprocedure Evaluation (Signed)
Anesthesia Evaluation  Patient identified by MRN, date of birth, ID band Patient awake    Reviewed: Allergy & Precautions, NPO status , Patient's Chart, lab work & pertinent test results  Airway Mallampati: II  TM Distance: >3 FB Neck ROM: Full    Dental no notable dental hx.    Pulmonary COPD, former smoker,    Pulmonary exam normal breath sounds clear to auscultation       Cardiovascular hypertension, + CAD, + Past MI, + Cardiac Stents and +CHF  Normal cardiovascular exam+ Cardiac Defibrillator  Rhythm:Regular Rate:Normal  Mildly dilated LV with EF 20-25%. Diffuse hypokinesis with   sparing of the basal lateral wall. Normal RV size and systolic   function. No significant valvular abnormalities.    Neuro/Psych negative neurological ROS  negative psych ROS   GI/Hepatic Neg liver ROS, GERD  ,  Endo/Other  negative endocrine ROS  Renal/GU negative Renal ROS  negative genitourinary   Musculoskeletal negative musculoskeletal ROS (+)   Abdominal   Peds negative pediatric ROS (+)  Hematology negative hematology ROS (+)   Anesthesia Other Findings   Reproductive/Obstetrics negative OB ROS                             Anesthesia Physical Anesthesia Plan  ASA: IV  Anesthesia Plan: MAC   Post-op Pain Management:    Induction: Intravenous  Airway Management Planned: Nasal Cannula  Additional Equipment:   Intra-op Plan:   Post-operative Plan:   Informed Consent: I have reviewed the patients History and Physical, chart, labs and discussed the procedure including the risks, benefits and alternatives for the proposed anesthesia with the patient or authorized representative who has indicated his/her understanding and acceptance.   Dental advisory given  Plan Discussed with: CRNA and Surgeon  Anesthesia Plan Comments:         Anesthesia Quick Evaluation

## 2016-05-15 NOTE — Interval H&P Note (Signed)
History and Physical Interval Note:  05/15/2016 11:35 AM  [name]  has presented today for surgery, with the diagnosis of abnoraml stomach  The various methods of treatment have been discussed with the patient and family. After consideration of risks, benefits and other options for treatment, the patient has consented to  Procedure(s): UPPER ENDOSCOPIC ULTRASOUND (EUS) RADIAL (N/A) as a surgical intervention .  The patient's history has been reviewed, patient examined, no change in status, stable for surgery.  I have reviewed the patient's chart and labs.  Questions were answered to the patient's satisfaction.     Milus Banister

## 2016-05-15 NOTE — H&P (View-Only) (Signed)
HPI :  70 y/o female here for EGD and colonoscopy. EGD done for workup of abdominal pain, which is PPI responsive, with prior CT changes of the stomach. Due for surveillance colonoscopy given history of polyps. She has a significant cardiac history, seen by cardiology for pre-operative assessment. She feels well without complaints today.   Past Medical History:  Diagnosis Date  . AICD (automatic cardioverter/defibrillator) present    Dr.Allred follows  . Aortic arch pseudoaneurysm (HCC)    a. followed by Dr. Prescott Gum.  . Asthma   . Benign neoplasm of colon   . CAD (coronary artery disease) 02/2009   a. anterior STEMI rx with BMS to prox LAD in 02/2009. b. ISR s/p PTCA 06/2009. c. ISR s/p thrombectomy & PTCA 03/2010 due to late stent thrombosis.   . Chronic renal insufficiency   . Chronic systolic CHF (congestive heart failure) ([place])    a. s/p ST. Jude ICD 2013.  . Colon polyp   . COPD (chronic obstructive pulmonary disease) ([place])   . Diverticulosis   . GI bleed    a. h/o GIB on DAPT, now on ASA only.  Marland Kitchen Headache(784.0)   . Hearing loss    left ear  . Hyperlipemia   . Hypertension   . Irritable bowel syndrome   . Ischemic cardiomyopathy    EF 10-15%  . Memory loss   . Myocardial infarct    Dr. Roswell Nickel   . PONV (postoperative nausea and vomiting)   . Pulmonary embolism ([place]) 2011  . Transfusion history    ?'12 or '13  . Unspecified mastoiditis      Past Surgical History:  Procedure Laterality Date  . ANGIOPLASTY  07/02/09, 04/01/10  . BILATERAL SALPINGOOPHORECTOMY    . CAD( bare metal stent)  8/10  . CERVICAL SPINE SURGERY  08/08  . IMPLANTABLE CARDIOVERTER DEFIBRILLATOR IMPLANT N/A 08/05/2011   Primary prevention SJM ICD implanted,  Analyze ST study patient  . INNER EAR SURGERY     left x 17  . LUMBAR DISC SURGERY  02/2008   fusion  . TOTAL ABDOMINAL HYSTERECTOMY     Family History  Problem Relation Age of Onset  . Colon cancer Mother   . Colon cancer  Brother   . Bladder Cancer Brother   . Breast cancer Cousin    Social History  Substance Use Topics  . Smoking status: Former Smoker    Packs/day: 0.25    Types: Cigarettes    Quit date: 04/27/2009  . Smokeless tobacco: Never Used  . Alcohol use No   Current Facility-Administered Medications  Medication Dose Route Frequency Provider Last Rate Last Dose  . 0.9 %  sodium chloride infusion   Intravenous Continuous [name], MD       Allergies  Allergen Reactions  . Lisinopril     cough  . Sulfa Antibiotics     cough  . Sulfonamide Derivatives Other (See Comments)    UNSURE     Review of Systems: All systems reviewed and negative except where noted in HPI.    Lab Results  Component Value Date   WBC 6.9 01/15/2016   HGB 12.9 01/15/2016   HCT 38.9 01/15/2016   MCV 98.5 01/15/2016   PLT 216 01/15/2016   Lab Results  Component Value Date   CREATININE 1.56 (H) 01/15/2016   BUN 29 (H) 01/15/2016   NA 143 01/15/2016   K 5.1 01/15/2016   CL 110 01/15/2016   CO2 21 01/15/2016  Lab Results  Component Value Date   ALT 11 01/15/2016   AST 13 01/15/2016   ALKPHOS 83 01/15/2016   BILITOT 0.3 01/15/2016      Physical Exam: BP (!) 145/88   Pulse 84   Temp 98.1 F (36.7 C) (Oral)   Resp 19   Ht 5\' 2"  (1.575 m)   Wt 171 lb (77.6 kg)   SpO2 99%   BMI 31.28 kg/m  Constitutional: Pleasant,well-developed, female in no acute distress. Cardiovascular: Normal rate, regular rhythm.  Pulmonary/chest: Effort normal and breath sounds normal. No wheezing, rales or rhonchi. Abdominal: Soft, nondistended, nontender. There are no masses palpable. . Extremities: no edema  ASSESSMENT AND PLAN: 70 y/o female with intermittent epigastric pain, responsive to PPI, with CT changes of the stomach, here for EGD to further evaluate and for colonoscopy for surveillance of history of polyps. Significant cardiac history, seen by cardiology for pre-operative assessment. I  have discussed risks / benefits of anesthesia and endoscopy with her, and she wishes to proceed. Further recommendations pending the results.    Richland Cellar, MD [facility] Pager [phone]

## 2016-05-15 NOTE — Transfer of Care (Signed)
Immediate Anesthesia Transfer of Care Note  Patient: [name]  Procedure(s) Performed: Procedure(s): UPPER ENDOSCOPIC ULTRASOUND (EUS) RADIAL (N/A)  Patient Location: Endoscopy Unit  Anesthesia Type:MAC  Level of Consciousness: awake  Airway & Oxygen Therapy: Patient Spontanous Breathing and Patient connected to nasal cannula oxygen  Post-op Assessment: Report given to RN and Post -op Vital signs reviewed and stable  Post vital signs: Reviewed and stable  Last Vitals:  Vitals:   05/15/16 1014  BP: (!) 143/71  Pulse: 77  Resp: 18  Temp: 36.9 C    Last Pain:  Vitals:   05/15/16 1014  TempSrc: Oral         Complications: No apparent anesthesia complications

## 2016-05-19 ENCOUNTER — Encounter (HOSPITAL_COMMUNITY): Payer: Self-pay | Admitting: Gastroenterology

## 2016-05-22 ENCOUNTER — Encounter: Payer: Self-pay | Admitting: Gastroenterology

## 2016-05-22 ENCOUNTER — Ambulatory Visit (INDEPENDENT_AMBULATORY_CARE_PROVIDER_SITE_OTHER): Payer: Medicare Other | Admitting: Internal Medicine

## 2016-05-22 ENCOUNTER — Encounter: Payer: Self-pay | Admitting: Internal Medicine

## 2016-05-22 VITALS — BP 120/72 | HR 82 | Ht 63.0 in | Wt 175.2 lb

## 2016-05-22 DIAGNOSIS — I5022 Chronic systolic (congestive) heart failure: Secondary | ICD-10-CM | POA: Diagnosis not present

## 2016-05-22 DIAGNOSIS — I2510 Atherosclerotic heart disease of native coronary artery without angina pectoris: Secondary | ICD-10-CM

## 2016-05-22 DIAGNOSIS — Z9861 Coronary angioplasty status: Secondary | ICD-10-CM

## 2016-05-22 DIAGNOSIS — I255 Ischemic cardiomyopathy: Secondary | ICD-10-CM | POA: Diagnosis not present

## 2016-05-22 DIAGNOSIS — I10 Essential (primary) hypertension: Secondary | ICD-10-CM

## 2016-05-22 LAB — CUP PACEART INCLINIC DEVICE CHECK
Battery Remaining Longevity: 63.6
Brady Statistic RV Percent Paced: 0 %
Date Time Interrogation Session: 20171026172240
HighPow Impedance: 73.125
Implantable Lead Location: 753860
Lead Channel Sensing Intrinsic Amplitude: 12 mV
Lead Channel Setting Pacing Pulse Width: 0.5 ms
Lead Channel Setting Sensing Sensitivity: 0.5 mV
MDC IDC LEAD IMPLANT DT: 20130108
MDC IDC MSMT LEADCHNL RV IMPEDANCE VALUE: 537.5 Ohm
MDC IDC MSMT LEADCHNL RV PACING THRESHOLD AMPLITUDE: 0.75 V
MDC IDC MSMT LEADCHNL RV PACING THRESHOLD PULSEWIDTH: 0.5 ms
MDC IDC PG SERIAL: 1016523
MDC IDC SET LEADCHNL RV PACING AMPLITUDE: 2.5 V

## 2016-05-22 NOTE — Progress Notes (Signed)
PCP: Leamon Arnt, MD Primary Cardiologist:  Dr Burt Knack (previously Dr [name])  [name] is a 70 y.o. female who presents today for routine electrophysiology followup.  Since last being seen in our clinic, the patient reports doing well.  She has SOB with moderate activity.  She has found that she has become less active because of this.   Today, she denies symptoms of palpitations, exertional chest pain,  lower extremity edema,  presyncope, syncope, or ICD shocks.  The patient is otherwise without complaint today.   Past Medical History:  Diagnosis Date  . AICD (automatic cardioverter/defibrillator) present    Dr.Amarilys Lyles follows  . Aortic arch pseudoaneurysm (HCC)    a. followed by Dr. Prescott Gum.  . Asthma   . Benign neoplasm of colon   . CAD (coronary artery disease) 02/2009   a. anterior STEMI rx with BMS to prox LAD in 02/2009. b. ISR s/p PTCA 06/2009. c. ISR s/p thrombectomy & PTCA 03/2010 due to late stent thrombosis.   . Chronic renal insufficiency   . Chronic systolic CHF (congestive heart failure) ([place])    a. s/p ST. Jude ICD 2013.  . Colon polyp   . COPD (chronic obstructive pulmonary disease) ([place])   . Diverticulosis   . GI bleed    a. h/o GIB on DAPT, now on ASA only.  Marland Kitchen Headache(784.0)    migraine  . Hearing loss    left ear  . Hyperlipemia   . Hypertension   . Irritable bowel syndrome   . Ischemic cardiomyopathy    EF 10-15%  . Memory loss   . Myocardial infarct 2011 x 2   Dr. Roswell Nickel   . PONV (postoperative nausea and vomiting)   . Pulmonary embolism ([place]) 2011  . Transfusion history    ?'12 or '13  . Unspecified mastoiditis    Past Surgical History:  Procedure Laterality Date  . ANGIOPLASTY  07/02/09, 04/01/10  . BILATERAL SALPINGOOPHORECTOMY    . CAD( bare metal stent)  02/2009   x 1  . CERVICAL SPINE SURGERY  08/08  . COLONOSCOPY WITH PROPOFOL N/A 04/29/2016   Procedure: COLONOSCOPY WITH PROPOFOL;  Surgeon: [name], MD;   Location: [facility] ENDOSCOPY;  Service: Gastroenterology;  Laterality: N/A;  . ESOPHAGOGASTRODUODENOSCOPY (EGD) WITH PROPOFOL N/A 04/29/2016   Procedure: ESOPHAGOGASTRODUODENOSCOPY (EGD) WITH PROPOFOL;  Surgeon: [name], MD;  Location: [facility] ENDOSCOPY;  Service: Gastroenterology;  Laterality: N/A;  . EUS N/A 05/15/2016   Procedure: UPPER ENDOSCOPIC ULTRASOUND (EUS) RADIAL;  Surgeon: Milus Banister, MD;  Location: [facility] ENDOSCOPY;  Service: Endoscopy;  Laterality: N/A;  . IMPLANTABLE CARDIOVERTER DEFIBRILLATOR IMPLANT N/A 08/05/2011   Primary prevention SJM ICD implanted,  Analyze ST study patient  . INNER EAR SURGERY     left x 17  . LUMBAR DISC SURGERY  02/2008   fusion  . TOTAL ABDOMINAL HYSTERECTOMY     complete    Current Outpatient Prescriptions  Medication Sig Dispense Refill  . albuterol (PROAIR HFA) 108 (90 BASE) MCG/ACT inhaler Inhale 2 puffs into the lungs every 6 (six) hours as needed. For shortness of breath    . amitriptyline (ELAVIL) 50 MG tablet Take 50 mg by mouth at bedtime.    Marland Kitchen aspirin 81 MG tablet Take 81 mg by mouth every morning.     Marland Kitchen CRESTOR 20 MG tablet TAKE 1 TABLET BY MOUTH AT BEDTIME. 30 tablet 11  . dexlansoprazole (DEXILANT) 60 MG capsule Take 1 capsule (60 mg total) by  mouth daily. 30 capsule 2  . ENTRESTO 24-26 MG TAKE 1 TABLET BY MOUTH TWICE DAILY 60 tablet 10  . Fluticasone-Salmeterol (ADVAIR) 250-50 MCG/DOSE AEPB Inhale 1 puff into the lungs 2 (two) times daily.    Marland Kitchen ibandronate (BONIVA) 150 MG tablet Take 150 mg by mouth every 30 (thirty) days. Take in the morning with a full glass of water, on an empty stomach, and do not take anything else by mouth or lie down for the next 30 min.    . polyethylene glycol powder (GLYCOLAX/MIRALAX) powder Take by mouth daily as needed for mild constipation. 1 cap full  1  . ranitidine (ZANTAC) 150 MG tablet Take 1 tablet (150 mg total) by mouth 2 (two) times daily. 60 tablet 2  . sucralfate (CARAFATE) 1 GM/10ML  suspension Take 10 mLs (1 g total) by mouth every 8 (eight) hours. 420 mL 1  . topiramate (TOPAMAX) 50 MG tablet Take 1 tablet (50 mg total) by mouth daily. (Patient taking differently: Take 50 mg by mouth every evening. ) 90 tablet 3   No current facility-administered medications for this visit.     Physical Exam: Vitals:   05/22/16 1408  BP: 120/72  Pulse: 82  SpO2: 96%  Weight: 175 lb 3.2 oz (79.5 kg)  Height: 5\' 3"  (1.6 m)    GEN- The patient is well appearing, alert and oriented x 3 today.   Head- normocephalic, atraumatic Eyes-  Sclera clear, conjunctiva pink Ears- hearing intact Oropharynx- clear Lungs- Clear to ausculation bilaterally, normal work of breathing Chest- ICD pocket is well healed Heart- Regular rate and rhythm, no murmurs, rubs or gallops, PMI not laterally displaced GI- soft, NT, ND, + BS Extremities- no clubbing, cyanosis, or edema  ICD interrogation- reviewed in detail today,  See PACEART report Recent ekg reviewed  Assessment and Plan:  Chronic systolic heart failure  Normal ICD function  See Pace Art report  No changes today  Will refer to BEAT HF trial coordinator for chart review and follow-up with patient.  She has advanced CHF and is not a candidate for CRT.  I have discussed SJM Fortify Assura advisary with the patient today. She understands that recommendation from SJM is to not replace the device at this time. The patient is not device dependant.  The patient has not had appropriate device therapy in the past or implanted for secondary prevention.  Vibratory alert demonstrated today.  She is actively remotely monitored and understands the importance of compliance today.  We have discussed today and agree to follow conservatively and not replace the device at this time.   Cardiomyopathy, ischemic  No ischemic symptoms   Follow-up with Dr Vangie Bicker Return to see EP NP yearly  I will see when needed  Thompson Grayer MD,  [facility] 05/22/2016 2:21 PM

## 2016-05-22 NOTE — Patient Instructions (Signed)
Medication Instructions:  Your physician recommends that you continue on your current medications as directed. Please refer to the Current Medication list given to you today.   Labwork: None ordered   Testing/Procedures: None ordered   Follow-Up: Your physician wants you to follow-up in: 12 months with Chanetta Marshall, NP You will receive a reminder letter in the mail two months in advance. If you don't receive a letter, please call our office to schedule the follow-up appointment.  Remote monitoring is used to monitor your  ICD from home. This monitoring reduces the number of office visits required to check your device to one time per year. It allows [place] to keep an eye on the functioning of your device to ensure it is working properly. You are scheduled for a device check from home on 08/21/16. You may send your transmission at any time that day. If you have a wireless device, the transmission will be sent automatically. After your physician reviews your transmission, you will receive a postcard with your next transmission date.     Any Other Special Instructions Will Be Listed Below (If Applicable).    Desmond Dike RN will call you in regards to BEAT HF

## 2016-05-23 NOTE — Progress Notes (Signed)
Letter mailed 10/27.jf

## 2016-06-10 ENCOUNTER — Other Ambulatory Visit: Payer: Self-pay | Admitting: Cardiovascular Disease

## 2016-07-30 ENCOUNTER — Other Ambulatory Visit: Payer: Medicare Other

## 2016-08-04 ENCOUNTER — Ambulatory Visit: Payer: Medicare Other | Admitting: Nurse Practitioner

## 2016-08-07 ENCOUNTER — Other Ambulatory Visit: Payer: Self-pay | Admitting: Gastroenterology

## 2016-08-07 DIAGNOSIS — K219 Gastro-esophageal reflux disease without esophagitis: Secondary | ICD-10-CM

## 2016-08-21 ENCOUNTER — Telehealth: Payer: Self-pay | Admitting: Cardiology

## 2016-08-21 ENCOUNTER — Encounter: Payer: Medicare Other | Admitting: *Deleted

## 2016-08-21 NOTE — Telephone Encounter (Signed)
Spoke with pt and reminded pt of remote transmission that is due today. Pt verbalized understanding.   

## 2016-08-22 ENCOUNTER — Encounter: Payer: Self-pay | Admitting: Cardiology

## 2016-08-26 ENCOUNTER — Encounter: Payer: Self-pay | Admitting: Nurse Practitioner

## 2016-08-26 ENCOUNTER — Ambulatory Visit (INDEPENDENT_AMBULATORY_CARE_PROVIDER_SITE_OTHER): Payer: Medicare Other | Admitting: Nurse Practitioner

## 2016-08-26 VITALS — BP 142/92 | HR 74 | Ht 63.0 in | Wt 173.4 lb

## 2016-08-26 DIAGNOSIS — R51 Headache: Secondary | ICD-10-CM

## 2016-08-26 DIAGNOSIS — R519 Headache, unspecified: Secondary | ICD-10-CM

## 2016-08-26 MED ORDER — TOPIRAMATE 50 MG PO TABS: 50.0000 mg | ORAL_TABLET | Freq: Every day | ORAL | 3 refills | Status: DC

## 2016-08-26 NOTE — Progress Notes (Signed)
[facility]  PATIENT: [name] DOB: 1946-03-10   REASON FOR VISIT: Follow-up for migraine HISTORY FROM: Patient    HISTORY OF PRESENT ILLNESS: [name] is a 71 year old right-handed white divorced female with a history of headaches and essential tremor. Patient of Dr. Erling Cruz,  She has history of multiple operations on the left mastoid between the ages of 84 and 40. She is currently on disability for that reason. She has CAD, PE, she has congestive heart failure, COPD, irritable bowel syndrome, left ankle fracture, hyperlipidemia, acute renal failure with stage III chronic renal disease, metabolic acidosis from Topamax, pulmonary embolism 03/2010, and a defibrillator was placed 08/2011.  She has history of migraine since age 69s, she complains of vertex area pressure headache, she has 2-6 headaches each month, She is taking amitriptyline 50mg  qhs, which has been helpful. She took overcounter medication for nause, as needed, her headache can last all day long, ASA make her stomach hurt,  She brought her headache diarrhea, she had 4-6 headache each month,  UPDATE 08/26/16 CM. [name], 71 year old female returns for follow-up for headaches. She reports 1 bad migraine in the last year and an additional 4 headaches since last seen. She is currently on Topamax 50 mg daily. She does not take an acute medication. She is not aware of any foods that are triggers. She will occasionally have nausea and vomiting with her headache.She returns for reevaluation REVIEW OF SYSTEMS: Full 14 system review of systems performed and notable only for those listed, all others are neg:  Constitutional: neg  Cardiovascular: neg Ear/Nose/Throat: hearing loss Skin: neg Eyes: neg Respiratory: neg Gastroitestinal: constipation Hematology/Lymphatic: neg  Endocrine: neg Musculoskeletal:walking difficulty Allergy/Immunology: neg Neurological: headache dizziness Psychiatric: neg Sleep :  neg   ALLERGIES: Allergies  Allergen Reactions  . Lisinopril     cough  . Sulfa Antibiotics     cough  . Sulfonamide Derivatives Other (See Comments)    UNSURE    HOME MEDICATIONS: Outpatient Medications Prior to Visit  Medication Sig Dispense Refill  . albuterol (PROAIR HFA) 108 (90 BASE) MCG/ACT inhaler Inhale 2 puffs into the lungs every 6 (six) hours as needed. For shortness of breath    . amitriptyline (ELAVIL) 50 MG tablet Take 50 mg by mouth at bedtime.    Marland Kitchen aspirin 81 MG tablet Take 81 mg by mouth every morning.     Marland Kitchen DEXILANT 60 MG capsule TAKE 1 CAPSULE (60 MG TOTAL) BY MOUTH DAILY. 30 capsule 2  . ENTRESTO 24-26 MG TAKE 1 TABLET BY MOUTH TWICE DAILY 60 tablet 10  . Fluticasone-Salmeterol (ADVAIR) 250-50 MCG/DOSE AEPB Inhale 1 puff into the lungs 2 (two) times daily.    Marland Kitchen ibandronate (BONIVA) 150 MG tablet Take 150 mg by mouth every 30 (thirty) days. Take in the morning with a full glass of water, on an empty stomach, and do not take anything else by mouth or lie down for the next 30 min.    . polyethylene glycol powder (GLYCOLAX/MIRALAX) powder Take by mouth daily as needed for mild constipation. 1 cap full  1  . ranitidine (ZANTAC) 150 MG tablet Take 1 tablet (150 mg total) by mouth 2 (two) times daily. 60 tablet 2  . rosuvastatin (CRESTOR) 20 MG tablet TAKE 1 TABLET BY MOUTH AT BEDTIME. 30 tablet 11  . sucralfate (CARAFATE) 1 GM/10ML suspension Take 10 mLs (1 g total) by mouth every 8 (eight) hours. 420 mL 1  . topiramate (TOPAMAX) 50 MG tablet  Take 1 tablet (50 mg total) by mouth daily. (Patient taking differently: Take 50 mg by mouth every evening. ) 90 tablet 3   No facility-administered medications prior to visit.     PAST MEDICAL HISTORY: Past Medical History:  Diagnosis Date  . AICD (automatic cardioverter/defibrillator) present    Dr.Allred follows  . Aortic arch pseudoaneurysm (HCC)    a. followed by Dr. Prescott Gum.  . Asthma   . Benign neoplasm of colon     . CAD (coronary artery disease) 02/2009   a. anterior STEMI rx with BMS to prox LAD in 02/2009. b. ISR s/p PTCA 06/2009. c. ISR s/p thrombectomy & PTCA 03/2010 due to late stent thrombosis.   . Chronic renal insufficiency   . Chronic systolic CHF (congestive heart failure) ([place])    a. s/p ST. Jude ICD 2013.  . Colon polyp   . COPD (chronic obstructive pulmonary disease) ([place])   . Diverticulosis   . GI bleed    a. h/o GIB on DAPT, now on ASA only.  Marland Kitchen Headache(784.0)    migraine  . Hearing loss    left ear  . Hyperlipemia   . Hypertension   . Irritable bowel syndrome   . Ischemic cardiomyopathy    EF 10-15%  . Memory loss   . Myocardial infarct 2011 x 2   Dr. Roswell Nickel   . PONV (postoperative nausea and vomiting)   . Pulmonary embolism ([place]) 2011  . Transfusion history    ?'12 or '13  . Unspecified mastoiditis     PAST SURGICAL HISTORY: Past Surgical History:  Procedure Laterality Date  . ANGIOPLASTY  07/02/09, 04/01/10  . BILATERAL SALPINGOOPHORECTOMY    . CAD( bare metal stent)  02/2009   x 1  . CERVICAL SPINE SURGERY  08/08  . COLONOSCOPY WITH PROPOFOL N/A 04/29/2016   Procedure: COLONOSCOPY WITH PROPOFOL;  Surgeon: [name], MD;  Location: [facility];  Service: Gastroenterology;  Laterality: N/A;  . ESOPHAGOGASTRODUODENOSCOPY (EGD) WITH PROPOFOL N/A 04/29/2016   Procedure: ESOPHAGOGASTRODUODENOSCOPY (EGD) WITH PROPOFOL;  Surgeon: [name], MD;  Location: [facility];  Service: Gastroenterology;  Laterality: N/A;  . EUS N/A 05/15/2016   Procedure: UPPER ENDOSCOPIC ULTRASOUND (EUS) RADIAL;  Surgeon: Milus Banister, MD;  Location: [facility];  Service: Endoscopy;  Laterality: N/A;  . IMPLANTABLE CARDIOVERTER DEFIBRILLATOR IMPLANT N/A 08/05/2011   Primary prevention SJM ICD implanted,  Analyze ST study patient  . INNER EAR SURGERY     left x 17  . LUMBAR DISC SURGERY  02/2008   fusion  . TOTAL ABDOMINAL HYSTERECTOMY     complete     FAMILY HISTORY: Family History  Problem Relation Age of Onset  . Colon cancer Mother   . Colon cancer Brother   . Bladder Cancer Brother   . Breast cancer Cousin     SOCIAL HISTORY: Social History   Social History  . Marital status: Divorced    Spouse name: N/A  . Number of children: 2  . Years of education: 11   Occupational History  . Unemployed Unemployed  .  Disabled   Social History Main Topics  . Smoking status: Former Smoker    Packs/day: 0.25    Types: Cigarettes    Quit date: 04/27/2009  . Smokeless tobacco: Never Used  . Alcohol use No  . Drug use: No  . Sexual activity: Not on file   Other Topics Concern  . Not on file   Social History Narrative  Pt lives in [place] alone.  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   Patient has 11 th grade education.Right handed.   Caffeine- one cup daily           PHYSICAL EXAM  Vitals:   08/26/16 1320  BP: (!) 142/92  Pulse: 74  Weight: 173 lb 6.4 oz (78.7 kg)  Height: 5\' 3"  (1.6 m)   Body mass index is 30.72 kg/m.  Generalized: Well developed,obese female  in no acute distress  Head: normocephalic and atraumatic,. Oropharynx benign  Neck: Supple, no carotid bruits  Cardiac: Regular rate rhythm, no murmur  Musculoskeletal: No deformity   Neurological examination   Mentation: Alert oriented to time, place, history taking. Attention span and concentration appropriate. Recent and remote memory intact.  Follows all commands speech and language fluent.   Cranial nerve II-XII: Pupils were equal round reactive to light extraocular movements were full, visual field were full on confrontational test. Facial sensation and strength were normal. Hard of hearing, left greater than right. Uvula tongue midline. head turning and shoulder shrug were normal and symmetric.Tongue protrusion into cheek strength was normal. Motor: normal bulk and tone, full strength in the BUE, BLE, fine finger movements  normal, no pronator drift. Coordination: finger-nose-finger, heel-to-shin bilaterally, no dysmetria Reflexes: symmetric upper and lower plantar responses were flexor bilaterally. Gait and Station: Rising up from seated position without assistance, normal stance,  moderate stride, good arm swing, smooth turning, able to perform tiptoe, and heel walking without difficulty. Tandem gait is unsteady. No assistive device  DIAGNOSTIC DATA (LABS, IMAGING, TESTING) - I reviewed patient records, labs, notes, testing and imaging myself where available.  Lab Results  Component Value Date   WBC 6.9 01/15/2016   HGB 12.9 01/15/2016   HCT 38.9 01/15/2016   MCV 98.5 01/15/2016   PLT 216 01/15/2016      Component Value Date/Time   NA 143 01/15/2016 1345   K 5.1 01/15/2016 1345   CL 110 01/15/2016 1345   CO2 21 01/15/2016 1345   GLUCOSE 95 01/15/2016 1345   BUN 29 (H) 01/15/2016 1345   CREATININE 1.56 (H) 01/15/2016 1345   CALCIUM 9.2 01/15/2016 1345   PROT 6.7 01/15/2016 1345   ALBUMIN 4.1 01/15/2016 1345   AST 13 01/15/2016 1345   ALT 11 01/15/2016 1345   ALKPHOS 83 01/15/2016 1345   BILITOT 0.3 01/15/2016 1345   GFRNONAA 54 (L) 12/26/2012 0159   GFRAA 62 (L) 12/26/2012 0159     Lab Results  Component Value Date   TSH 4.499 08/30/2015      ASSESSMENT AND PLAN  71 y.o. year old female  has a past medical history migraine headaches. Also past medical history of hyperlipidemia, coronary artery disease status post pacemaker insertion.She has had 4 migraines in the last year and 1 additional severe migraine.The patient is a current patient of Dr.Yan  who is out of the office today . This note is sent to the work in doctor.       PLAN: Continue Topamax 50mg  daily will refill for one year  follow-up yearly and when necessary [name], [facility], [facility], [facility] [address], [place], [place] [address] [phone]

## 2016-08-26 NOTE — Patient Instructions (Signed)
Continue Topamax 50 mg daily will refill for one year Call for increase in headaches Follow-up yearly and when necessary

## 2016-08-27 ENCOUNTER — Other Ambulatory Visit: Payer: Self-pay | Admitting: Gastroenterology

## 2016-08-27 DIAGNOSIS — K219 Gastro-esophageal reflux disease without esophagitis: Secondary | ICD-10-CM

## 2016-08-27 NOTE — Progress Notes (Signed)
I have read the note, and I agree with the clinical assessment and plan.  Tonnette Zwiebel A. Xaidyn Kepner, MD, PhD Certified in Neurology, Clinical Neurophysiology, Sleep Medicine, Pain Medicine and Neuroimaging  [facility] [address], [address] [place], [place] [address] [phone]  

## 2016-08-29 ENCOUNTER — Telehealth: Payer: Self-pay

## 2016-08-29 NOTE — Telephone Encounter (Signed)
Spoke to patient regarding heart failure study research screening appointment. Referral from Dr. Rayann Heman. Patient scheduled Feb 5th 11am.

## 2016-09-01 ENCOUNTER — Ambulatory Visit (HOSPITAL_COMMUNITY)
Admission: RE | Admit: 2016-09-01 | Discharge: 2016-09-01 | Disposition: A | Discharge status: Home / Self Care | Payer: Medicare Other | Source: Ambulatory Visit | Attending: Internal Medicine | Admitting: Internal Medicine

## 2016-09-01 ENCOUNTER — Other Ambulatory Visit: Payer: Self-pay

## 2016-09-01 DIAGNOSIS — I5022 Chronic systolic (congestive) heart failure: Secondary | ICD-10-CM

## 2016-09-01 DIAGNOSIS — I340 Nonrheumatic mitral (valve) insufficiency: Secondary | ICD-10-CM | POA: Insufficient documentation

## 2016-09-01 DIAGNOSIS — Z01818 Encounter for other preprocedural examination: Secondary | ICD-10-CM

## 2016-09-01 DIAGNOSIS — Z006 Encounter for examination for normal comparison and control in clinical research program: Secondary | ICD-10-CM

## 2016-09-01 DIAGNOSIS — Z6830 Body mass index (BMI) 30.0-30.9, adult: Secondary | ICD-10-CM | POA: Insufficient documentation

## 2016-09-01 DIAGNOSIS — E669 Obesity, unspecified: Secondary | ICD-10-CM | POA: Insufficient documentation

## 2016-09-01 DIAGNOSIS — I110 Hypertensive heart disease with heart failure: Secondary | ICD-10-CM | POA: Insufficient documentation

## 2016-09-01 DIAGNOSIS — I2510 Atherosclerotic heart disease of native coronary artery without angina pectoris: Secondary | ICD-10-CM | POA: Insufficient documentation

## 2016-09-01 DIAGNOSIS — I252 Old myocardial infarction: Secondary | ICD-10-CM | POA: Insufficient documentation

## 2016-09-01 MED ORDER — PERFLUTREN LIPID MICROSPHERE
1.0000 mL | INTRAVENOUS | Status: AC | PRN
  Administered 2016-09-01: 2 mL via INTRAVENOUS
  Filled 2016-09-01: qty 10

## 2016-09-01 NOTE — Progress Notes (Signed)
  Echocardiogram 2D Echocardiogram with Definity has been performed.  Tresa Res 09/01/2016, 2:15 PM

## 2016-09-01 NOTE — Progress Notes (Signed)
Patient present to screen for BeAT-HF study. Study discussed with patient, consent reviewed, questions answered, consent signed before any research procedures occurred. Patient verbalized she is really suffering with heart failure since her heart attack. She states she rarely goes out. NYHA Class III. Vital signs, weight, 6 min hall walk completed at 11 m, patient very SOB but no acute distress, she stopped to rest but completed walk. Echo scheduled for later today. Will speak to patient tomorrow regarding Echo and lab results. Patient states "I am happy to participate in the study as it might help my weak heart."

## 2016-09-02 ENCOUNTER — Encounter: Payer: Self-pay | Admitting: Surgery

## 2016-09-03 ENCOUNTER — Encounter (HOSPITAL_COMMUNITY): Payer: Medicare Other

## 2016-09-03 ENCOUNTER — Encounter: Payer: Medicare Other | Admitting: Surgery

## 2016-09-08 ENCOUNTER — Ambulatory Visit (HOSPITAL_COMMUNITY)
Admission: RE | Admit: 2016-09-08 | Discharge: 2016-09-08 | Disposition: A | Discharge status: Home / Self Care | Payer: Medicare Other | Source: Ambulatory Visit | Attending: Surgery | Admitting: Surgery

## 2016-09-08 ENCOUNTER — Ambulatory Visit (INDEPENDENT_AMBULATORY_CARE_PROVIDER_SITE_OTHER): Payer: Medicare Other | Admitting: Surgery

## 2016-09-08 ENCOUNTER — Encounter: Payer: Self-pay | Admitting: Surgery

## 2016-09-08 VITALS — BP 128/87 | HR 84 | Temp 98.1°F | Resp 20 | Ht 63.0 in | Wt 169.0 lb

## 2016-09-08 DIAGNOSIS — Z006 Encounter for examination for normal comparison and control in clinical research program: Secondary | ICD-10-CM

## 2016-09-08 DIAGNOSIS — Z01818 Encounter for other preprocedural examination: Secondary | ICD-10-CM

## 2016-09-08 DIAGNOSIS — I5022 Chronic systolic (congestive) heart failure: Secondary | ICD-10-CM

## 2016-09-08 LAB — VAS US CAROTID
LCCADDIAS: 18 cm/s
LCCADSYS: 59 cm/s
LEFT ECA DIAS: -8 cm/s
LICADDIAS: -21 cm/s
LICAPDIAS: -20 cm/s
LICAPSYS: -54 cm/s
Left CCA prox dias: 21 cm/s
Left CCA prox sys: 91 cm/s
Left ICA dist sys: -53 cm/s
RCCAPSYS: 64 cm/s
RIGHT CCA MID DIAS: 22 cm/s
RIGHT ECA DIAS: -6 cm/s
Right CCA prox dias: 20 cm/s
Right cca dist sys: -57 cm/s

## 2016-09-08 NOTE — Progress Notes (Signed)
[facility]  Patient name: [name] MRN: [id number] DOB: 12-09-1945 Sex: female   REFERRING PROVIDER:    Dr. Rayann Heman    REASON FOR CONSULT:    Barostem eval  HISTORY OF PRESENT ILLNESS:   [name] is a 71 y.o. female, who is Referred today for possible Barostem implantation. She has a history of coronary artery disease, status post MI with multiple LAD interventions.  She has undergone ICD implantation secondary to residual left ventricular systolic dysfunction.  She is a former smoker.  She suffers from COPD.  She is medically managed for hypercholesterolemia with a statin.  She has an allergy to adhesive inhibitors  PAST MEDICAL HISTORY    Past Medical History:  Diagnosis Date  . AICD (automatic cardioverter/defibrillator) present    Dr.Allred follows  . Aortic arch pseudoaneurysm (HCC)    a. followed by Dr. Prescott Gum.  . Asthma   . Benign neoplasm of colon   . CAD (coronary artery disease) 02/2009   a. anterior STEMI rx with BMS to prox LAD in 02/2009. b. ISR s/p PTCA 06/2009. c. ISR s/p thrombectomy & PTCA 03/2010 due to late stent thrombosis.   . Chronic renal insufficiency   . Chronic systolic CHF (congestive heart failure) ([place])    a. s/p ST. Jude ICD 2013.  . Colon polyp   . COPD (chronic obstructive pulmonary disease) ([place])   . Diverticulosis   . GI bleed    a. h/o GIB on DAPT, now on ASA only.  Marland Kitchen Headache(784.0)    migraine  . Hearing loss    left ear  . Hyperlipemia   . Hypertension   . Irritable bowel syndrome   . Ischemic cardiomyopathy    EF 10-15%  . Memory loss   . Myocardial infarct 2011 x 2   Dr. Roswell Nickel   . PONV (postoperative nausea and vomiting)   . Pulmonary embolism ([place]) 2011  . Transfusion history    ?'12 or '13  . Unspecified mastoiditis      FAMILY HISTORY   Family History  Problem Relation Age of Onset  . Colon cancer Mother   . Colon  cancer Brother   . Bladder Cancer Brother   . Breast cancer Cousin     SOCIAL HISTORY:   Social History   Social History  . Marital status: Divorced    Spouse name: N/A  . Number of children: 2  . Years of education: 11   Occupational History  . Unemployed Unemployed  .  Disabled   Social History Main Topics  . Smoking status: Former Smoker    Packs/day: 0.25    Types: Cigarettes    Quit date: 04/27/2009  . Smokeless tobacco: Never Used  . Alcohol use No  . Drug use: No  . Sexual activity: Not on file   Other Topics Concern  . Not on file   Social History Narrative   Pt lives in [place] alone.  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   Patient has 11 th grade education.Right handed.   Caffeine- one cup daily          ALLERGIES:    Allergies  Allergen Reactions  . Lisinopril     cough  . Sulfa Antibiotics     cough  . Sulfonamide Derivatives Other (See Comments)    UNSURE    CURRENT MEDICATIONS:    Current Outpatient Prescriptions  Medication Sig Dispense Refill  . albuterol (PROAIR  HFA) 108 (90 BASE) MCG/ACT inhaler Inhale 2 puffs into the lungs every 6 (six) hours as needed. For shortness of breath    . amitriptyline (ELAVIL) 50 MG tablet Take 50 mg by mouth at bedtime.    Marland Kitchen aspirin 81 MG tablet Take 81 mg by mouth every morning.     Marland Kitchen DEXILANT 60 MG capsule TAKE 1 CAPSULE (60 MG TOTAL) BY MOUTH DAILY. 30 capsule 2  . ENTRESTO 24-26 MG TAKE 1 TABLET BY MOUTH TWICE DAILY 60 tablet 10  . Fluticasone-Salmeterol (ADVAIR) 250-50 MCG/DOSE AEPB Inhale 1 puff into the lungs 2 (two) times daily.    Marland Kitchen ibandronate (BONIVA) 150 MG tablet Take 150 mg by mouth every 30 (thirty) days. Take in the morning with a full glass of water, on an empty stomach, and do not take anything else by mouth or lie down for the next 30 min.    . polyethylene glycol powder (GLYCOLAX/MIRALAX) powder Take by mouth daily as needed for mild constipation. 1 cap full  1  .  ranitidine (ZANTAC) 150 MG tablet TAKE 1 TABLET BY MOUTH 2 TIMES DAILY. 60 tablet 2  . rosuvastatin (CRESTOR) 20 MG tablet TAKE 1 TABLET BY MOUTH AT BEDTIME. 30 tablet 11  . sucralfate (CARAFATE) 1 GM/10ML suspension Take 10 mLs (1 g total) by mouth every 8 (eight) hours. 420 mL 1  . topiramate (TOPAMAX) 50 MG tablet Take 1 tablet (50 mg total) by mouth daily. 90 tablet 3   No current facility-administered medications for this visit.     REVIEW OF SYSTEMS:   [X]  denotes positive finding, [ ]  denotes negative finding Cardiac  Comments:  Chest pain or chest pressure: x   Shortness of breath upon exertion: x   Short of breath when lying flat:    Irregular heart rhythm:        Vascular    Pain in calf, thigh, or hip brought on by ambulation: x   Pain in feet at night that wakes you up from your sleep:  x   Blood clot in your veins:    Leg swelling:         Pulmonary    Oxygen at home:    Productive cough:     Wheezing:         Neurologic    Sudden weakness in arms or legs:     Sudden numbness in arms or legs:     Sudden onset of difficulty speaking or slurred speech:    Temporary loss of vision in one eye:     Problems with dizziness:  x       Gastrointestinal    Blood in stool:      Vomited blood:         Genitourinary    Burning when urinating:     Blood in urine:        Psychiatric    Major depression:         Hematologic    Bleeding problems:    Problems with blood clotting too easily:        Skin    Rashes or ulcers:        Constitutional    Fever or chills:     PHYSICAL EXAM:   Vitals:   09/08/16 1027 09/08/16 1030  BP: 125/83 128/87  Pulse: 84   Resp: 20   Temp: 98.1 F (36.7 C)   TempSrc: Oral   SpO2: 98%   Weight: 169 lb (76.7  kg)   Height: 5\' 3"  (1.6 m)     GENERAL: The patient is a well-nourished female, in no acute distress. The vital signs are documented above. CARDIAC: There is a regular rate and rhythm.  VASCULAR: No carotid bruits    PULMONARY: Nonlabored respirations MUSCULOSKELETAL: There are no major deformities or cyanosis. NEUROLOGIC: No focal weakness or paresthesias are detected. SKIN: There are no ulcers or rashes noted. PSYCHIATRIC: The patient has a normal affect.  STUDIES:   Carotid duplex was ordered and reviewed.  This shows bilateral 1-39 percent disease.  She has a high bifurcation   I used the Sonosite ultrasound at the bedside to evaluate her bifurcation.  It is high but accessible, below the angle of the mandible  ASSESSMENT and PLAN   I discussed the details of the device implant including the incisions and the hospital stay.  I feel like she is a suitable candidate for surgery.  She will be randomized in the immediate future and scheduled appropriately.  Annamarie Major, MD [facility] [phone] Pager [phone]

## 2016-09-09 ENCOUNTER — Ambulatory Visit: Payer: Medicare Other | Admitting: Nurse Practitioner

## 2016-09-09 DIAGNOSIS — Z006 Encounter for examination for normal comparison and control in clinical research program: Secondary | ICD-10-CM

## 2016-09-09 NOTE — Progress Notes (Addendum)
Spoke to patient today regarding heart failure medications. She states "I do not remember why my Lasix was stopped, maybe I had an intolerance. I feel I do not tolerate medications well. I will call Dr. Burt Knack or Allred and ask." She does know her Bisoprolol was stopped because of stomach pain. Had extensive GI workup back in Oct 17.  She feels she is tolerating Entresto well at the current dose. Patient agrees that it would be a good idea to get Lasix started back daily as she has gotten more short of breath lately, doesn't tolerate activity at all, and feels smothered when she lays flat. Since our last conversation she is working on getting rid of high sodium foods. Message left with Dr. Rayann Heman regarding restart of Lasix daily. NT PRO BNP 1003. Creatinine 1.3 Send out to [facility] [employer] [facility] as part of screening labs for BeAT-HF study. Will scan into media section of chart. Patient will await call from research coordinator. Very pleasant. Have also requested follow up appt at [facility].

## 2016-09-11 DIAGNOSIS — Z006 Encounter for examination for normal comparison and control in clinical research program: Secondary | ICD-10-CM

## 2016-09-11 MED ORDER — FUROSEMIDE 20 MG PO TABS: 20.0000 mg | ORAL_TABLET | Freq: Every day | ORAL | 2 refills | Status: DC

## 2016-09-12 ENCOUNTER — Telehealth: Payer: Self-pay

## 2016-09-12 NOTE — Telephone Encounter (Signed)
Call to patient to inform that Lasix 20 mg 1 by mouth daily has been sent to [facility]. She verbalized she will get it started today, and will call research coordinator if she has any questions. I also sent message to [facility] requesting a follow up appointment.

## 2016-09-19 ENCOUNTER — Encounter: Payer: Self-pay | Admitting: Cardiology

## 2016-09-24 ENCOUNTER — Other Ambulatory Visit: Payer: Self-pay | Admitting: Internal Medicine

## 2016-09-24 MED ORDER — SACUBITRIL-VALSARTAN 24-26 MG PO TABS: 1.0000 | ORAL_TABLET | Freq: Two times a day (BID) | ORAL | 2 refills | Status: DC

## 2016-09-25 ENCOUNTER — Ambulatory Visit (INDEPENDENT_AMBULATORY_CARE_PROVIDER_SITE_OTHER): Payer: Medicare Other | Admitting: Cardiovascular Disease

## 2016-09-25 ENCOUNTER — Encounter: Payer: Self-pay | Admitting: Cardiovascular Disease

## 2016-09-25 VITALS — BP 110/74 | HR 76 | Ht 63.0 in | Wt 174.4 lb

## 2016-09-25 DIAGNOSIS — I10 Essential (primary) hypertension: Secondary | ICD-10-CM | POA: Diagnosis not present

## 2016-09-25 DIAGNOSIS — I5022 Chronic systolic (congestive) heart failure: Secondary | ICD-10-CM

## 2016-09-25 DIAGNOSIS — I255 Ischemic cardiomyopathy: Secondary | ICD-10-CM

## 2016-09-25 MED ORDER — BISOPROLOL FUMARATE 5 MG PO TABS: 5.0000 mg | ORAL_TABLET | Freq: Every day | ORAL | 11 refills | Status: DC

## 2016-09-25 NOTE — Progress Notes (Signed)
Cardiology Office Note Date:  09/27/2016   ID:  [name], DOB Feb 22, 1946, MRN [id number]  PCP:  Leamon Arnt, MD  Cardiologist:  [name], MD    Chief Complaint  Patient presents with  . Shortness of Breath   History of Present Illness: [name] is a 71 y.o. female who presents for  follow-up evaluation. The patient has a history of coronary disease with anterior wall MI. She's had multiple LAD interventions and ultimately she had stent thrombosis in 2011. She's had severe residual left ventricular systolic dysfunction and has undergone ICD implantation. She's been maintained only on low-dose aspirin because of GI bleeding on dual antiplatelet therapy. She was seen by Dr Haroldine Laws in the [facility] who felt she was not a candidate for advanced therapies because of memory problems and COPD.  Also has been followed for an aortic pseudoaneurysm of the distal arch by Dr Prescott Gum - this is felt to be stable by serial CT imaging and the patient has been asymptomatic in that regard.   The patient returns for follow-up evaluation. She is enrolled in the Beat-HF study but awaiting 4 weeks of loop diuretic therapy (lasix recently started).   She continues to have significant shortness of breath with activity. She has limited to low level walking. She walked 218 m on her 6 minute walk test. She has not had any chest pain or pressure. No orthopnea or PND. No leg swelling. She does complain of generalized fatigue.  Past Medical History:  Diagnosis Date  . AICD (automatic cardioverter/defibrillator) present    Dr.Allred follows  . Aortic arch pseudoaneurysm (HCC)    a. followed by Dr. Prescott Gum.  . Asthma   . Benign neoplasm of colon   . CAD (coronary artery disease) 02/2009   a. anterior STEMI rx with BMS to prox LAD in 02/2009. b. ISR s/p PTCA 06/2009. c. ISR s/p thrombectomy & PTCA 03/2010 due to late stent thrombosis.   . Chronic renal insufficiency   .  Chronic systolic CHF (congestive heart failure) ([place])    a. s/p ST. Jude ICD 2013.  . Colon polyp   . COPD (chronic obstructive pulmonary disease) ([place])   . Diverticulosis   . GI bleed    a. h/o GIB on DAPT, now on ASA only.  Marland Kitchen Headache(784.0)    migraine  . Hearing loss    left ear  . Hyperlipemia   . Hypertension   . Irritable bowel syndrome   . Ischemic cardiomyopathy    EF 10-15%  . Memory loss   . Myocardial infarct 2011 x 2   Dr. Roswell Nickel   . PONV (postoperative nausea and vomiting)   . Pulmonary embolism ([place]) 2011  . Transfusion history    ?'12 or '13  . Unspecified mastoiditis     Past Surgical History:  Procedure Laterality Date  . ANGIOPLASTY  07/02/09, 04/01/10  . BILATERAL SALPINGOOPHORECTOMY    . CAD( bare metal stent)  02/2009   x 1  . CERVICAL SPINE SURGERY  08/08  . COLONOSCOPY WITH PROPOFOL N/A 04/29/2016   Procedure: COLONOSCOPY WITH PROPOFOL;  Surgeon: [name], MD;  Location: [facility];  Service: Gastroenterology;  Laterality: N/A;  . ESOPHAGOGASTRODUODENOSCOPY (EGD) WITH PROPOFOL N/A 04/29/2016   Procedure: ESOPHAGOGASTRODUODENOSCOPY (EGD) WITH PROPOFOL;  Surgeon: [name], MD;  Location: [facility];  Service: Gastroenterology;  Laterality: N/A;  . EUS N/A 05/15/2016   Procedure: UPPER ENDOSCOPIC ULTRASOUND (EUS) RADIAL;  Surgeon: Milus Banister, MD;  Location: [facility];  Service: Endoscopy;  Laterality: N/A;  . IMPLANTABLE CARDIOVERTER DEFIBRILLATOR IMPLANT N/A 08/05/2011   Primary prevention SJM ICD implanted,  Analyze ST study patient  . INNER EAR SURGERY     left x 17  . LUMBAR DISC SURGERY  02/2008   fusion  . TOTAL ABDOMINAL HYSTERECTOMY     complete    Current Outpatient Prescriptions  Medication Sig Dispense Refill  . albuterol (PROAIR HFA) 108 (90 BASE) MCG/ACT inhaler Inhale 2 puffs into the lungs every 6 (six) hours as needed. For shortness of breath    . amitriptyline (ELAVIL) 50 MG tablet  Take 50 mg by mouth at bedtime.    Marland Kitchen aspirin 81 MG tablet Take 81 mg by mouth every morning.     Marland Kitchen DEXILANT 60 MG capsule TAKE 1 CAPSULE (60 MG TOTAL) BY MOUTH DAILY. 30 capsule 2  . Fluticasone-Salmeterol (ADVAIR) 250-50 MCG/DOSE AEPB Inhale 1 puff into the lungs 2 (two) times daily.    . furosemide (LASIX) 20 MG tablet Take 1 tablet (20 mg total) by mouth daily. 30 tablet 2  . ibandronate (BONIVA) 150 MG tablet Take 150 mg by mouth every 30 (thirty) days. Take in the morning with a full glass of water, on an empty stomach, and do not take anything else by mouth or lie down for the next 30 min.    . polyethylene glycol powder (GLYCOLAX/MIRALAX) powder Take by mouth daily as needed for mild constipation. 1 cap full  1  . ranitidine (ZANTAC) 150 MG tablet TAKE 1 TABLET BY MOUTH 2 TIMES DAILY. 60 tablet 2  . rosuvastatin (CRESTOR) 20 MG tablet TAKE 1 TABLET BY MOUTH AT BEDTIME. 30 tablet 11  . sacubitril-valsartan (ENTRESTO) 24-26 MG Take 1 tablet by mouth 2 (two) times daily. 180 tablet 2  . sucralfate (CARAFATE) 1 GM/10ML suspension Take 10 mLs (1 g total) by mouth every 8 (eight) hours. 420 mL 1  . topiramate (TOPAMAX) 50 MG tablet Take 1 tablet (50 mg total) by mouth daily. 90 tablet 3  . bisoprolol (ZEBETA) 5 MG tablet Take 1 tablet (5 mg total) by mouth daily. 30 tablet 11   No current facility-administered medications for this visit.     Allergies:   Lisinopril; Sulfa antibiotics; and Sulfonamide derivatives   Social History:  The patient  reports that she quit smoking about 7 years ago. Her smoking use included Cigarettes. She smoked 0.25 packs per day. She has never used smokeless tobacco. She reports that she does not drink alcohol or use drugs.   Family History:  The patient's  family history includes Bladder Cancer in her brother; Breast cancer in her cousin; Colon cancer in her brother and mother.    ROS:  Please see the history of present illness.  Otherwise, review of systems is  positive for Hearing loss, abdominal pain, back pain, dizziness, easy bruising, sweating, constipation, headaches.  All other systems are reviewed and negative.   PHYSICAL EXAM: VS:  BP 110/74 (BP Location: Left Arm)   Pulse 76   Ht 5\' 3"  (1.6 m)   Wt 174 lb 6.4 oz (79.1 kg)   BMI 30.89 kg/m  , BMI Body mass index is 30.89 kg/m. GEN: Pleasant overweight woman, in no acute distress  HEENT: normal  Neck: no JVD, no masses. No carotid bruits Cardiac: RRR without murmur or gallop  Respiratory:  clear to auscultation bilaterally, normal work of breathing GI: soft, nontender, nondistended, + BS MS: no deformity or atrophy  Ext: no pretibial edema, pedal pulses 2+= bilaterally Skin: warm and dry, no rash Neuro:  Strength and sensation are intact Psych: euthymic mood, full affect  EKG:  EKG is ordered today. The ekg ordered today shows normal sinus rhythm 76 bpm, right atrial enlargement, pulmonary disease pattern, T-wave abnormality consider lateral ischemia.  Recent Labs: 01/15/2016: ALT 11; Brain Natriuretic Peptide 105.4; BUN 29; Creat 1.56; Hemoglobin 12.9; Platelets 216; Potassium 5.1; Sodium 143   Lipid Panel     Component Value Date/Time   CHOL 140 01/15/2016 1345   TRIG 224 (H) 01/15/2016 1345   HDL 52 01/15/2016 1345   CHOLHDL 2.7 01/15/2016 1345   VLDL 45 (H) 01/15/2016 1345   LDLCALC 43 01/15/2016 1345      Wt Readings from Last 3 Encounters:  09/25/16 174 lb 6.4 oz (79.1 kg)  09/09/16 173 lb 9.6 oz (78.7 kg)  09/08/16 169 lb (76.7 kg)     Cardiac Studies Reviewed: 2D Echo 09-01-2016: Study Conclusions  - Left ventricle: The cavity size was normal. Wall thickness was   normal. Systolic function was severely reduced. The estimated   ejection fraction was in the range of 25% to 30%. There is   akinesis of the anteroseptal and apical myocardium. Doppler   parameters are consistent with abnormal left ventricular   relaxation (grade 1 diastolic  dysfunction). Doppler parameters   are consistent with high ventricular filling pressure. - Left atrium: The atrium was mildly dilated. - Right ventricle: Pacer wire or catheter noted in right ventricle.  Impressions:  - When compared to prior study, EF is mildly improved. (Prior   20-25%)  ASSESSMENT AND PLAN: 1. Chronic systolic heart failure, New York Heart Association functional class III: Seems to be tolerating her medical program well. For some reason it looks like bisoprolol has fallen off of her medication list. I reviewed documentation and can find any specific reason this was stopped. The patient doesn't recall any issues related to this medicine. I recommended that she start back on bisoprolol 5 mg daily. She will continue on Entresto and furosemide at current doses. She continues evaluation under the Beat-HF protocol.   2. Coronary artery disease, native vessel, with old MI: no recurrence of angina. Resume beta-blocker.   3. S/P ICD: followed by Dr Rayann Heman  4. Thoracic aortic pseudoaneurysm: followed by Dr. Prescott Gum with serial CT scans.  5. Hyperlipidemia: treated with crestor. LDL 43 mg/dL.    Current medicines are reviewed with the patient today.  The patient does not have concerns regarding medicines.  Labs/ tests ordered today include:   Orders Placed This Encounter  Procedures  . EKG 12-Lead    Disposition:   FU 6 months  Signed, [name], MD  09/27/2016 6:50 PM    [facility] [address], [place], [place]  [address] Phone: [phone]; Fax: [phone]

## 2016-09-25 NOTE — Patient Instructions (Signed)
Medication Instructions:  Your physician has recommended you make the following change in your medication:  1. START Bisoprolol 5mg  take one tablet by mouth daily  Labwork: No new orders.   Testing/Procedures: No new orders.   Follow-Up: Your physician wants you to follow-up in: 6 MONTHS with Dr Burt Knack.  You will receive a reminder letter in the mail two months in advance. If you don't receive a letter, please call our office to schedule the follow-up appointment.   Any Other Special Instructions Will Be Listed Below (If Applicable).     If you need a refill on your cardiac medications before your next appointment, please call your pharmacy.

## 2016-10-08 ENCOUNTER — Telehealth: Payer: Self-pay

## 2016-10-08 ENCOUNTER — Other Ambulatory Visit: Payer: Self-pay

## 2016-10-08 DIAGNOSIS — K317 Polyp of stomach and duodenum: Secondary | ICD-10-CM

## 2016-10-08 NOTE — Telephone Encounter (Signed)
Left message for patient that she is due for her 6 month recheck EGD. This is scheduled at [facility] on 5/15. Prep instructions mailed to patient.

## 2016-10-08 NOTE — Telephone Encounter (Signed)
Left message for patient about scheduled EGD at [facility] 5/15. Instructions mailed to patient.

## 2016-10-08 NOTE — Telephone Encounter (Signed)
-----   Message from [name], MD sent at 09/25/2016  4:33 PM EST ----- Thanks Almyra Free,   Yes unfortunately due to low EF it needs to be done at the hospital. This is a routine follow up, I would say next available hospital 1/2 day, which may next be in May. Thanks  ----- Message ----- From: Doristine Counter, RN Sent: 09/25/2016   2:29 PM To: [name], MD  Hi, This patient came up in my To Do List, she is due for a 6 month repeat EGD. Do you want her scheduled at [facility] or the hospital? Thanks let me know. Almyra Free

## 2016-10-08 NOTE — Telephone Encounter (Signed)
Call to patient regarding participation in the BeAT-HF Study. Explained that she would need to be on the Bisoloprol for 28 days before rescreening could take place. She verbalized understanding and an appointment for Thursday 10-23-16 12 noon was made. She also states she appreciates the extra support of the research study, as her son and brother are facing health obstacles.

## 2016-10-09 NOTE — Telephone Encounter (Signed)
Spoke to patient, let her know to expect the paperwork in the mail re: EGD scheduled for [facility] on 5/15. Patient is having to take care of her son, who has cancer, so she will try to make necessary arrangements for the 15th. I told her to play it by ear, and to give [place] as much notice as possible if she needs to reschedule.

## 2016-10-09 NOTE — Telephone Encounter (Signed)
Patient returned Julia's call. She called [facility] by mistake and got me and it took [place] a bit to figure out who she needed. Please call her at [phone]

## 2016-10-21 ENCOUNTER — Telehealth: Payer: Self-pay

## 2016-10-21 NOTE — Telephone Encounter (Signed)
Call to patient to schedule for Beat HF Study rescreen. She states she has been taking her diuretic daily and tomorrow is the 28th day of her bisoloprol. She states she feels well, continues to suffer with same fatigue and shortness of breath associated with her heart failure. Reassured her and thanked her for participating in the [place] HF Study.

## 2016-10-22 ENCOUNTER — Ambulatory Visit (HOSPITAL_COMMUNITY)
Admission: RE | Admit: 2016-10-22 | Discharge: 2016-10-22 | Disposition: A | Discharge status: Home / Self Care | Payer: Medicare Other | Source: Ambulatory Visit | Attending: Internal Medicine | Admitting: Internal Medicine

## 2016-10-22 ENCOUNTER — Other Ambulatory Visit: Payer: Self-pay | Admitting: Internal Medicine

## 2016-10-22 DIAGNOSIS — I5021 Acute systolic (congestive) heart failure: Secondary | ICD-10-CM | POA: Diagnosis not present

## 2016-10-22 DIAGNOSIS — Z006 Encounter for examination for normal comparison and control in clinical research program: Secondary | ICD-10-CM

## 2016-10-22 NOTE — Progress Notes (Signed)
  Echocardiogram 2D Echocardiogram limited has been performed.  Jennette Dubin 10/22/2016, 12:45 PM

## 2016-10-22 NOTE — Progress Notes (Signed)
Patient present to retest for BeAT-HF Study. She verbalizes she has been taking her diuretic, beta blocker along with all of her other prescribed medications daily. She did take her medications this morning. Patient states her symptoms are unchanged since last Research visit. She continues to suffer with fatigue associated with heart failure. She is unable to complete ADL's without resting. She states "I am really not urinating that much with diuretic, but I understand I have bad kidneys." She denies any adverse events since last Research visit. Echo, 6MHW, labs, EKG, and NYHA/ACC repeated. Will follow up with patient once test have resulted. Very pleasant.

## 2016-11-07 ENCOUNTER — Encounter: Payer: Self-pay | Admitting: Cardiology

## 2016-11-17 ENCOUNTER — Ambulatory Visit (INDEPENDENT_AMBULATORY_CARE_PROVIDER_SITE_OTHER): Payer: Medicare Other | Admitting: *Deleted

## 2016-11-17 DIAGNOSIS — I255 Ischemic cardiomyopathy: Secondary | ICD-10-CM

## 2016-11-18 NOTE — Progress Notes (Signed)
Remote ICD transmission.   

## 2016-11-19 DIAGNOSIS — Z006 Encounter for examination for normal comparison and control in clinical research program: Secondary | ICD-10-CM

## 2016-11-19 NOTE — Progress Notes (Addendum)
Patient present for BeAT-HF Study Week 2 visit. She states she feels good today, but she was treated for UTI since she was last here. She is unsure of the name of the antibiotic but she did complete it. Patient signed release of medical record in order for writer to request records from Dr. Maxie Better, a [facility] doctor. Patient verbalized compliance with low sodium heart failure diet and with medications. Looks well today, no fluid overload noted. No other questions or concerns today. Will return in 2 weeks for Research visit.   With patient's permission Probation officer called [employer]. Patient was dispensed Nitrofurantin (Macrobid) 100mg  BID x 5 days on April 10th.

## 2016-11-20 ENCOUNTER — Encounter: Payer: Self-pay | Admitting: Cardiology

## 2016-11-20 LAB — CUP PACEART REMOTE DEVICE CHECK
Battery Voltage: 2.95 V
Brady Statistic RV Percent Paced: 1 %
Date Time Interrogation Session: 20180423183832
HIGH POWER IMPEDANCE MEASURED VALUE: 95 Ohm
HighPow Impedance: 95 Ohm
Implantable Lead Implant Date: 20130108
Implantable Lead Location: 753860
Lead Channel Pacing Threshold Amplitude: 0.75 V
Lead Channel Pacing Threshold Pulse Width: 0.5 ms
Lead Channel Sensing Intrinsic Amplitude: 12 mV
Lead Channel Setting Pacing Amplitude: 2.5 V
Lead Channel Setting Pacing Pulse Width: 0.5 ms
MDC IDC MSMT BATTERY REMAINING LONGEVITY: 60 mo
MDC IDC MSMT BATTERY REMAINING PERCENTAGE: 52 %
MDC IDC MSMT LEADCHNL RV IMPEDANCE VALUE: 510 Ohm
MDC IDC PG IMPLANT DT: 20130108
MDC IDC SET LEADCHNL RV SENSING SENSITIVITY: 0.5 mV
Pulse Gen Serial Number: 1016523

## 2016-11-24 ENCOUNTER — Other Ambulatory Visit: Payer: Self-pay | Admitting: Gastroenterology

## 2016-11-24 DIAGNOSIS — K219 Gastro-esophageal reflux disease without esophagitis: Secondary | ICD-10-CM

## 2016-12-03 NOTE — Progress Notes (Signed)
Patient present for BeAT-HF Study Week 4 visit. She states she feels good today, no adverse events since last research visit. Writer continues to await requested medical records from Dr. Maxie Better, a [facility] doctor. Patient verbalized compliance with low sodium heart failure diet and with medications. Looks well today, no fluid overload noted. Encouraged her to inquire on Silver Sneaker or other exercise program; she should consult with Dr. Marcelino Scot before but moving more would benefit her. No other questions or concerns today. Will return in 2 weeks for Research visit Week 6.    With patient's permission Probation officer called [employer]. Patient was dispensed Nitrofurantin (Macrobid) 100mg  BID x 5 days on April 10th.

## 2016-12-04 ENCOUNTER — Telehealth: Payer: Self-pay

## 2016-12-04 NOTE — Telephone Encounter (Signed)
Patient advised of move up in time for procedure on 5/15, to arrive at 9:00 am.

## 2016-12-05 ENCOUNTER — Other Ambulatory Visit: Payer: Self-pay | Admitting: Gastroenterology

## 2016-12-05 DIAGNOSIS — K219 Gastro-esophageal reflux disease without esophagitis: Secondary | ICD-10-CM

## 2016-12-09 ENCOUNTER — Encounter (HOSPITAL_COMMUNITY): Payer: Self-pay | Admitting: Emergency Medicine

## 2016-12-09 ENCOUNTER — Encounter (HOSPITAL_COMMUNITY)
Admission: RE | Disposition: A | Discharge status: Home / Self Care | Payer: Self-pay | Source: Ambulatory Visit | Attending: Gastroenterology

## 2016-12-09 ENCOUNTER — Ambulatory Visit (HOSPITAL_COMMUNITY): Payer: Medicare Other | Admitting: Certified Registered Nurse Anesthetist

## 2016-12-09 ENCOUNTER — Ambulatory Visit (HOSPITAL_COMMUNITY)
Admission: RE | Admit: 2016-12-09 | Discharge: 2016-12-09 | Disposition: A | Discharge status: Home / Self Care | Payer: Medicare Other | Source: Ambulatory Visit | Attending: Gastroenterology | Admitting: Gastroenterology

## 2016-12-09 DIAGNOSIS — Q402 Other specified congenital malformations of stomach: Secondary | ICD-10-CM | POA: Diagnosis not present

## 2016-12-09 DIAGNOSIS — K2960 Other gastritis without bleeding: Secondary | ICD-10-CM

## 2016-12-09 DIAGNOSIS — K317 Polyp of stomach and duodenum: Secondary | ICD-10-CM

## 2016-12-09 DIAGNOSIS — K449 Diaphragmatic hernia without obstruction or gangrene: Secondary | ICD-10-CM | POA: Diagnosis not present

## 2016-12-09 DIAGNOSIS — Z888 Allergy status to other drugs, medicaments and biological substances status: Secondary | ICD-10-CM | POA: Diagnosis not present

## 2016-12-09 DIAGNOSIS — K3189 Other diseases of stomach and duodenum: Secondary | ICD-10-CM | POA: Diagnosis not present

## 2016-12-09 DIAGNOSIS — I255 Ischemic cardiomyopathy: Secondary | ICD-10-CM | POA: Insufficient documentation

## 2016-12-09 DIAGNOSIS — Z95810 Presence of automatic (implantable) cardiac defibrillator: Secondary | ICD-10-CM | POA: Diagnosis not present

## 2016-12-09 DIAGNOSIS — Z882 Allergy status to sulfonamides status: Secondary | ICD-10-CM | POA: Diagnosis not present

## 2016-12-09 DIAGNOSIS — I5022 Chronic systolic (congestive) heart failure: Secondary | ICD-10-CM | POA: Insufficient documentation

## 2016-12-09 DIAGNOSIS — I712 Thoracic aortic aneurysm, without rupture: Secondary | ICD-10-CM | POA: Diagnosis not present

## 2016-12-09 DIAGNOSIS — J449 Chronic obstructive pulmonary disease, unspecified: Secondary | ICD-10-CM | POA: Diagnosis not present

## 2016-12-09 DIAGNOSIS — I2510 Atherosclerotic heart disease of native coronary artery without angina pectoris: Secondary | ICD-10-CM | POA: Diagnosis not present

## 2016-12-09 DIAGNOSIS — E669 Obesity, unspecified: Secondary | ICD-10-CM | POA: Insufficient documentation

## 2016-12-09 DIAGNOSIS — M1990 Unspecified osteoarthritis, unspecified site: Secondary | ICD-10-CM | POA: Insufficient documentation

## 2016-12-09 DIAGNOSIS — Z803 Family history of malignant neoplasm of breast: Secondary | ICD-10-CM | POA: Insufficient documentation

## 2016-12-09 DIAGNOSIS — Z800 Family history of malignant neoplasm of digestive organs: Secondary | ICD-10-CM | POA: Diagnosis not present

## 2016-12-09 DIAGNOSIS — I130 Hypertensive heart and chronic kidney disease with heart failure and stage 1 through stage 4 chronic kidney disease, or unspecified chronic kidney disease: Secondary | ICD-10-CM | POA: Diagnosis not present

## 2016-12-09 DIAGNOSIS — Z90710 Acquired absence of both cervix and uterus: Secondary | ICD-10-CM | POA: Diagnosis not present

## 2016-12-09 DIAGNOSIS — Z8052 Family history of malignant neoplasm of bladder: Secondary | ICD-10-CM | POA: Insufficient documentation

## 2016-12-09 DIAGNOSIS — Z86010 Personal history of colonic polyps: Secondary | ICD-10-CM | POA: Diagnosis not present

## 2016-12-09 DIAGNOSIS — H9192 Unspecified hearing loss, left ear: Secondary | ICD-10-CM | POA: Diagnosis not present

## 2016-12-09 DIAGNOSIS — K589 Irritable bowel syndrome without diarrhea: Secondary | ICD-10-CM | POA: Insufficient documentation

## 2016-12-09 DIAGNOSIS — Z86711 Personal history of pulmonary embolism: Secondary | ICD-10-CM | POA: Insufficient documentation

## 2016-12-09 DIAGNOSIS — I252 Old myocardial infarction: Secondary | ICD-10-CM | POA: Insufficient documentation

## 2016-12-09 DIAGNOSIS — N189 Chronic kidney disease, unspecified: Secondary | ICD-10-CM | POA: Insufficient documentation

## 2016-12-09 DIAGNOSIS — Z87891 Personal history of nicotine dependence: Secondary | ICD-10-CM | POA: Insufficient documentation

## 2016-12-09 DIAGNOSIS — E785 Hyperlipidemia, unspecified: Secondary | ICD-10-CM | POA: Diagnosis not present

## 2016-12-09 DIAGNOSIS — K2980 Duodenitis without bleeding: Secondary | ICD-10-CM | POA: Diagnosis present

## 2016-12-09 DIAGNOSIS — Z955 Presence of coronary angioplasty implant and graft: Secondary | ICD-10-CM | POA: Insufficient documentation

## 2016-12-09 DIAGNOSIS — Z6832 Body mass index (BMI) 32.0-32.9, adult: Secondary | ICD-10-CM | POA: Insufficient documentation

## 2016-12-09 HISTORY — PX: ESOPHAGOGASTRODUODENOSCOPY: SHX5428

## 2016-12-09 SURGERY — EGD (ESOPHAGOGASTRODUODENOSCOPY)
Anesthesia: Monitor Anesthesia Care

## 2016-12-09 MED ORDER — SODIUM CHLORIDE 0.9 % IV SOLN: INTRAVENOUS | Status: DC

## 2016-12-09 MED ORDER — EPHEDRINE SULFATE-NACL 50-0.9 MG/10ML-% IV SOSY
PREFILLED_SYRINGE | INTRAVENOUS | Status: DC | PRN
  Administered 2016-12-09: 10 mg via INTRAVENOUS

## 2016-12-09 MED ORDER — ONDANSETRON HCL 4 MG/2ML IJ SOLN
INTRAMUSCULAR | Status: DC | PRN
  Administered 2016-12-09: 4 mg via INTRAVENOUS

## 2016-12-09 MED ORDER — ONDANSETRON HCL 4 MG/2ML IJ SOLN
INTRAMUSCULAR | Status: AC
  Filled 2016-12-09: qty 2

## 2016-12-09 MED ORDER — LACTATED RINGERS IV SOLN
INTRAVENOUS | Status: DC
Start: 2016-12-09 — End: 2016-12-09
  Administered 2016-12-09: 10:00:00 via INTRAVENOUS

## 2016-12-09 MED ORDER — PROPOFOL 500 MG/50ML IV EMUL
INTRAVENOUS | Status: DC | PRN
  Administered 2016-12-09: 75 ug/kg/min via INTRAVENOUS

## 2016-12-09 MED ORDER — LIDOCAINE 2% (20 MG/ML) 5 ML SYRINGE
INTRAMUSCULAR | Status: AC
  Filled 2016-12-09: qty 5

## 2016-12-09 MED ORDER — PROPOFOL 10 MG/ML IV BOLUS
INTRAVENOUS | Status: DC | PRN
  Administered 2016-12-09 (×13): 10 mg via INTRAVENOUS

## 2016-12-09 MED ORDER — LIDOCAINE 2% (20 MG/ML) 5 ML SYRINGE
INTRAMUSCULAR | Status: DC | PRN
  Administered 2016-12-09: 80 mg via INTRAVENOUS

## 2016-12-09 MED ORDER — PROPOFOL 10 MG/ML IV BOLUS
INTRAVENOUS | Status: AC
  Filled 2016-12-09: qty 60

## 2016-12-09 NOTE — Discharge Instructions (Signed)
Esophagogastroduodenoscopy, Care After °Refer to this sheet in the next few weeks. These instructions provide you with information about caring for yourself after your procedure. Your health care provider may also give you more specific instructions. Your treatment has been planned according to current medical practices, but problems sometimes occur. Call your health care provider if you have any problems or questions after your procedure. °What can I expect after the procedure? °After the procedure, it is common to have: °· A sore throat. °· Nausea. °· Bloating. °· Dizziness. °· Fatigue. °Follow these instructions at home: °· Do not eat or drink anything until the numbing medicine (local anesthetic) has worn off and your gag reflex has returned. You will know that the local anesthetic has worn off when you can swallow comfortably. °· Do not drive for 24 hours if you received a medicine to help you relax (sedative). °· If your health care provider took a tissue sample for testing during the procedure, make sure to get your test results. This is your responsibility. Ask your health care provider or the department performing the test when your results will be ready. °· Keep all follow-up visits as told by your health care provider. This is important. °Contact a health care provider if: °· You cannot stop coughing. °· You are not urinating. °· You are urinating less than usual. °Get help right away if: °· You have trouble swallowing. °· You cannot eat or drink. °· You have throat or chest pain that gets worse. °· You are dizzy or light-headed. °· You faint. °· You have nausea or vomiting. °· You have chills. °· You have a fever. °· You have severe abdominal pain. °· You have black, tarry, or bloody stools. °This information is not intended to replace advice given to you by your health care provider. Make sure you discuss any questions you have with your health care provider. °Document Released: 06/30/2012 Document  Revised: 12/20/2015 Document Reviewed: 06/07/2015 °Elsevier Interactive Patient Education © 2017 [employer]vier [employer]. ° °

## 2016-12-09 NOTE — Transfer of Care (Signed)
Immediate Anesthesia Transfer of Care Note  Patient: [name]  Procedure(s) Performed: Procedure(s): ESOPHAGOGASTRODUODENOSCOPY (EGD) (N/A)  Patient Location: ENDO  Anesthesia Type:MAC  Level of Consciousness:  sedated, patient cooperative and responds to stimulation  Airway & Oxygen Therapy:Patient Spontanous Breathing and Patient connected to face mask oxgen  Post-op Assessment:  Report given to ENDO RN and Post -op Vital signs reviewed and stable  Post vital signs:  Reviewed and stable  Last Vitals:  Vitals:   12/09/16 0930 12/09/16 1049  BP: 114/71   Pulse: 87 83  Resp: 15 18  Temp: 41.6 C     Complications: No apparent anesthesia complications

## 2016-12-09 NOTE — Anesthesia Preprocedure Evaluation (Addendum)
Anesthesia Evaluation  Patient identified by MRN, date of birth, ID band Patient awake    Reviewed: Allergy & Precautions, NPO status , Patient's Chart, lab work & pertinent test results, reviewed documented beta blocker date and time   History of Anesthesia Complications (+) PONV and history of anesthetic complications  Airway Mallampati: II  TM Distance: >3 FB Neck ROM: Full    Dental  (+) Upper Dentures, Dental Advisory Given   Pulmonary neg shortness of breath, asthma , neg sleep apnea, COPD,  COPD inhaler, neg recent URI, former smoker, PE (2011)   Pulmonary exam normal breath sounds clear to auscultation       Cardiovascular Exercise Tolerance: Poor hypertension, Pt. on medications and Pt. on home beta blockers + CAD, + Past MI (STEMI 2010), + Cardiac Stents (BMS to LAD in 02/2009) and +CHF  (-) CABG and (-) PND + dysrhythmias (PACs, incomplete RBBB) + Cardiac Defibrillator (St. Jude)  Rhythm:Regular Rate:Normal  HLD, aortic arch pseudoaneurysm Impressions 2018:  - Limited echo for EF. Severe LVE. EF 25-30% Abnormal septal motion septal, apical and inferior wall hypokinesis RV normal size with pacing wire seen in cavity. Myocardial Perfusion Scan 08/08/2013: large scar without ischemia   Neuro/Psych  Headaches, neg Seizures Memory loss, DDD    GI/Hepatic Neg liver ROS, GERD  Medicated and Controlled,IBS, diverticulosis   Endo/Other  negative endocrine ROS  Renal/GU CRFRenal disease     Musculoskeletal  (+) Arthritis ,   Abdominal (+) + obese,   Peds  Hematology  (+) Blood dyscrasia, anemia ,   Anesthesia Other Findings Hearing loss in left ear  Reproductive/Obstetrics                            Anesthesia Physical  Anesthesia Plan  ASA: IV  Anesthesia Plan: MAC   Post-op Pain Management:    Induction: Intravenous  Airway Management Planned: Natural Airway and Nasal  Cannula  Additional Equipment:   Intra-op Plan:   Post-operative Plan:   Informed Consent: I have reviewed the patients History and Physical, chart, labs and discussed the procedure including the risks, benefits and alternatives for the proposed anesthesia with the patient or authorized representative who has indicated his/her understanding and acceptance.   Dental advisory given  Plan Discussed with: CRNA  Anesthesia Plan Comments: (Patient has AICD. Per Dr. Rayann Heman, magnet should be placed over device during procedure. Post-op interrogation is not needed.)        Anesthesia Quick Evaluation

## 2016-12-09 NOTE — H&P (Signed)
HPI :  71 y/o' female with history of CAD with coronary stents, history of CHF with EF 25%, ICD in place, here for follow up EGD. She has had a history of thickening of the antum - unclear if hyperplastic polyps versus prominent folds. Initial EGD showed hyperplastic polyps, follow up EUS showed no mass lesions and biopsies unremarkable. Here for interval EGD to assess for any changes, and plan for repeat biopsies. She endorses some periodic heartburn and intermittent epigastric pain which bothers her.   Past Medical History:  Diagnosis Date  . AICD (automatic cardioverter/defibrillator) present    Dr.Allred follows  . Aortic arch pseudoaneurysm (HCC)    a. followed by Dr. Prescott Gum.  . Asthma   . Benign neoplasm of colon   . CAD (coronary artery disease) 02/2009   a. anterior STEMI rx with BMS to prox LAD in 02/2009. b. ISR s/p PTCA 06/2009. c. ISR s/p thrombectomy & PTCA 03/2010 due to late stent thrombosis.   . Chronic renal insufficiency   . Chronic systolic CHF (congestive heart failure) ([place])    a. s/p ST. Jude ICD 2013.  . Colon polyp   . COPD (chronic obstructive pulmonary disease) ([place])   . Diverticulosis   . GI bleed    a. h/o GIB on DAPT, now on ASA only.  Marland Kitchen Headache(784.0)    migraine  . Hearing loss    left ear  . Hyperlipemia   . Hypertension   . Irritable bowel syndrome   . Ischemic cardiomyopathy    EF 10-15%  . Memory loss   . Myocardial infarct [facility]) 2011 x 2   Dr. Roswell Nickel   . Pulmonary embolism ([place]) 2011  . Transfusion history    ?'12 or '13  . Unspecified mastoiditis      Past Surgical History:  Procedure Laterality Date  . ANGIOPLASTY  07/02/09, 04/01/10  . BILATERAL SALPINGOOPHORECTOMY    . CAD( bare metal stent)  02/2009   x 1  . CERVICAL SPINE SURGERY  08/08  . COLONOSCOPY WITH PROPOFOL N/A 04/29/2016   Procedure: COLONOSCOPY WITH PROPOFOL;  Surgeon: [name], MD;  Location: [facility];  Service: Gastroenterology;   Laterality: N/A;  . ESOPHAGOGASTRODUODENOSCOPY (EGD) WITH PROPOFOL N/A 04/29/2016   Procedure: ESOPHAGOGASTRODUODENOSCOPY (EGD) WITH PROPOFOL;  Surgeon: [name], MD;  Location: [facility];  Service: Gastroenterology;  Laterality: N/A;  . EUS N/A 05/15/2016   Procedure: UPPER ENDOSCOPIC ULTRASOUND (EUS) RADIAL;  Surgeon: Milus Banister, MD;  Location: [facility];  Service: Endoscopy;  Laterality: N/A;  . IMPLANTABLE CARDIOVERTER DEFIBRILLATOR IMPLANT N/A 08/05/2011   Primary prevention SJM ICD implanted,  Analyze ST study patient  . INNER EAR SURGERY     left x 17  . LUMBAR DISC SURGERY  02/2008   fusion  . TOTAL ABDOMINAL HYSTERECTOMY     complete   Family History  Problem Relation Age of Onset  . Colon cancer Mother   . Colon cancer Brother   . Bladder Cancer Brother   . Breast cancer Cousin    Social History  Substance Use Topics  . Smoking status: Former Smoker    Packs/day: 0.25    Types: Cigarettes    Quit date: 04/27/2009  . Smokeless tobacco: Never Used  . Alcohol use No   Current Facility-Administered Medications  Medication Dose Route Frequency Provider Last Rate Last Dose  . 0.9 %  sodium chloride infusion   Intravenous Continuous Armbruster, Renelda Loma, MD      .  lactated ringers infusion   Intravenous Continuous Armbruster, Renelda Loma, MD 1,000 mL/hr at 12/09/16 6294     Allergies  Allergen Reactions  . Lisinopril     cough  . Sulfa Antibiotics Other (See Comments)    Unknown, childhood allergy   . Sulfonamide Derivatives Other (See Comments)    UNSURE     Review of Systems: All systems reviewed and negative except where noted in HPI.   Lab Results  Component Value Date   WBC 6.9 01/15/2016   HGB 12.9 01/15/2016   HCT 38.9 01/15/2016   MCV 98.5 01/15/2016   PLT 216 01/15/2016    Lab Results  Component Value Date   CREATININE 1.56 (H) 01/15/2016   BUN 29 (H) 01/15/2016   NA 143 01/15/2016   K 5.1 01/15/2016   CL 110  01/15/2016   CO2 21 01/15/2016    Lab Results  Component Value Date   ALT 11 01/15/2016   AST 13 01/15/2016   ALKPHOS 83 01/15/2016   BILITOT 0.3 01/15/2016     Physical Exam: BP 114/71   Pulse 87   Temp 98 F (36.7 C) (Oral)   Resp 15   Ht 5\' 2"  (1.575 m)   Wt 175 lb (79.4 kg)   SpO2 98%   BMI 32.01 kg/m  Constitutional: Pleasant,well-developed, female in no acute distress.  Cardiovascular: Normal rate, regular rhythm.  Pulmonary/chest: Effort normal and breath sounds normal. No wheezing, rales or rhonchi. Abdominal: Soft, nondistended, nontender. There are no masses palpable.  Extremities: no edema  ASSESSMENT AND PLAN: 71 y/o female with CAD, CHF, ICD in place, here for surveillance EGD for abnormal finding 6 months ago as above - unclear if benign thickening of antrum versus large hyperplastic polyps. EUS was reassuring, no mass lesions. Here for repeat EGD with biopsies to clarify findings. Discussed risks / benefits of sedation / anesthesia with her and she wished to proceed.    Cellar, MD [facility] Gastroenterology Pager [phone]

## 2016-12-09 NOTE — Interval H&P Note (Signed)
History and Physical Interval Note:  12/09/2016 9:56 AM  [name]  has presented today for surgery, with the diagnosis of gastric polyps, 6 month follow up EGD  The various methods of treatment have been discussed with the patient and family. After consideration of risks, benefits and other options for treatment, the patient has consented to  Procedure(s): ESOPHAGOGASTRODUODENOSCOPY (EGD) (N/A) as a surgical intervention .  The patient's history has been reviewed, patient examined, no change in status, stable for surgery.  I have reviewed the patient's chart and labs.  Questions were answered to the patient's satisfaction.     Renelda Loma Patrick Salemi

## 2016-12-09 NOTE — Op Note (Signed)
[facility] Patient Name: [name] Procedure Date: 12/09/2016 MRN: [id number] Attending MD: Carlota Raspberry. [name] MD, MD Date of Birth: 12-15-1945 CSN: [id number] Age: 71 Admit Type: Outpatient Procedure:                Upper GI endoscopy Indications:              Follow-up of enlarged gastric folds versys gastric                            polyp of the antrum, prior EUS without mass lesion                            / thickening only, here for follow up to ensure no                            interval change Providers:                Carlota Raspberry. Lynita Groseclose MD, MD, Vista Lawman, RN,                            William Dalton, Technician, Tinnie Gens, Technician Referring MD:              Medicines:                Monitored Anesthesia Care Complications:            No immediate complications. Estimated blood loss:                            Minimal. Estimated Blood Loss:     Estimated blood loss was minimal. Procedure:                Pre-Anesthesia Assessment:                           - Prior to the procedure, a History and Physical                            was performed, and patient medications and                            allergies were reviewed. The patient's tolerance of                            previous anesthesia was also reviewed. The risks                            and benefits of the procedure and the sedation                            options and risks were discussed with the patient.                            All questions were answered, and informed consent  was obtained. Prior Anticoagulants: The patient has                            taken aspirin, last dose was 1 day prior to                            procedure. ASA Grade Assessment: III - A patient                            with severe systemic disease. After reviewing the                            risks and benefits, the patient was deemed in           satisfactory condition to undergo the procedure.                           After obtaining informed consent, the endoscope was                            passed under direct vision. Throughout the                            procedure, the patient's blood pressure, pulse, and                            oxygen saturations were monitored continuously. The                            EG-[id number] ([id number]) scope was introduced through the                            mouth, and advanced to the second part of duodenum.                            The upper GI endoscopy was accomplished without                            difficulty. The patient tolerated the procedure                            well. Scope In: Scope Out: Findings:      Esophagogastric landmarks were identified: the Z-line was found at 37       cm, the gastroesophageal junction was found at 37 cm and the upper       extent of the gastric folds was found at 40 cm from the incisors.      A 3 cm hiatal hernia was present.      The exam of the esophagus was otherwise normal. No esophagitis noted.      Localized thick gastric folds versus polypoid tissue was found in the       gastric antrum along the grater curvature, 3 o'clock position in       relation to the pylorus. It did not appear adenomatous Multiple biopsies  were taken with a cold forceps for histology.      The exam of the stomach was otherwise normal.      Localized nodular mucosa was found in the duodenal bulb, grossly       consistent with ectopic gastric mucosa. Biopsies were taken with a cold       forceps for histology.      A single 8 mm sessile polyp was found in the duodenal bulb, it had an       adenomatous appearance to it. The polyp was removed with a hot snare.       Resection and retrieval were complete, with a small red spot at the base       of it post polypectomy. To prevent bleeding after the polypectomy, a       hemostatic clip was applied to  close the defect, unfortunately it       misfired. Another clip was then successfully placed. There was no       bleeding during, or at the end, of the procedure.      A single 3 mm sessile polyp was found in the second portion of the       duodenum. The polyp was removed with a cold biopsy forceps. Resection       and retrieval were complete.      The exam of the duodenum was otherwise normal. Impression:               - Esophagogastric landmarks identified.                           - 3 cm hiatal hernia.                           - Normal esophagus, no evidence of esophagitis.                           - Enlarged gastric fold versus polypoid tissue of                            the antrum. Biopsied.                           - Nodular mucosa in the duodenal bulb, suspect                            benign ectopic gastric mucosa. Biopsied.                           - A single duodenal polyp. Resected and retrieved                            with hot snare. Clips were placed as above                            prophylactically.                           - A single duodenal polyp. Resected and retrieved. Moderate Sedation:      No moderate sedation, case performed with  MAC Recommendation:           - Patient has a contact number available for                            emergencies. The signs and symptoms of potential                            delayed complications were discussed with the                            patient. Return to normal activities tomorrow.                            Written discharge instructions were provided to the                            patient.                           - Resume previous diet.                           - Continue present medications.                           - Await pathology results.                           - No ibuprofen, naproxen, or other non-steroidal                            anti-inflammatory drugs for 2 weeks after polyp                             removal. Procedure Code(s):        --- Professional ---                           [phone], Esophagogastroduodenoscopy, flexible,                            transoral; with removal of tumor(s), polyp(s), or                            other lesion(s) by snare technique                           43239, 59, Esophagogastroduodenoscopy, flexible,                            transoral; with biopsy, single or multiple Diagnosis Code(s):        --- Professional ---                           K44.9, Diaphragmatic hernia without obstruction or  gangrene                           K29.60, Other gastritis without bleeding                           K31.89, Other diseases of stomach and duodenum                           K31.7, Polyp of stomach and duodenum CPT copyright 2016 American Medical Association. All rights reserved. The codes documented in this report are preliminary and upon coder review may  be revised to meet current compliance requirements. Remo Lipps P. Christiano Blandon MD, MD 12/09/2016 10:43:17 AM This report has been signed electronically. Number of Addenda: 0

## 2016-12-09 NOTE — Anesthesia Postprocedure Evaluation (Addendum)
Anesthesia Post Note  Patient: [name]  Procedure(s) Performed: Procedure(s) (LRB): ESOPHAGOGASTRODUODENOSCOPY (EGD) (N/A)  Patient location during evaluation: Endoscopy Anesthesia Type: MAC Level of consciousness: awake and alert Pain management: pain level controlled Vital Signs Assessment: post-procedure vital signs reviewed and stable Respiratory status: spontaneous breathing and respiratory function stable Cardiovascular status: stable Anesthetic complications: no       Last Vitals:  Vitals:   12/09/16 1102 12/09/16 1110  BP: 106/63 111/82  Pulse: 73 76  Resp: 16 13  Temp:      Last Pain:  Vitals:   12/09/16 1049  TempSrc: Oral                 [name]

## 2016-12-10 ENCOUNTER — Encounter (HOSPITAL_COMMUNITY): Payer: Self-pay | Admitting: Gastroenterology

## 2016-12-11 ENCOUNTER — Encounter: Payer: Self-pay | Admitting: Gastroenterology

## 2016-12-11 ENCOUNTER — Other Ambulatory Visit: Payer: Self-pay | Admitting: *Deleted

## 2016-12-11 DIAGNOSIS — I712 Thoracic aortic aneurysm, without rupture, unspecified: Secondary | ICD-10-CM

## 2016-12-17 ENCOUNTER — Telehealth: Payer: Self-pay

## 2016-12-17 DIAGNOSIS — Z006 Encounter for examination for normal comparison and control in clinical research program: Secondary | ICD-10-CM

## 2016-12-17 NOTE — Telephone Encounter (Signed)
Return call to patient as she called and left message yesterday that she needs transportation to today's research visit. Made her aware that [employer] will pick her up at 10:45 today.

## 2016-12-23 ENCOUNTER — Other Ambulatory Visit: Payer: Self-pay | Admitting: Internal Medicine

## 2016-12-27 NOTE — Addendum Note (Signed)
Addendum  created 12/27/16 1004 by Duane Boston, MD   Sign clinical note

## 2016-12-30 DIAGNOSIS — Z006 Encounter for examination for normal comparison and control in clinical research program: Secondary | ICD-10-CM

## 2016-12-31 ENCOUNTER — Ambulatory Visit (INDEPENDENT_AMBULATORY_CARE_PROVIDER_SITE_OTHER): Payer: Medicare Other | Admitting: Cardiothoracic Surgery

## 2016-12-31 ENCOUNTER — Ambulatory Visit
Admission: RE | Admit: 2016-12-31 | Discharge: 2016-12-31 | Disposition: A | Discharge status: Home / Self Care | Payer: Medicare Other | Source: Ambulatory Visit | Attending: Cardiothoracic Surgery | Admitting: Cardiothoracic Surgery

## 2016-12-31 ENCOUNTER — Encounter: Payer: Self-pay | Admitting: Cardiothoracic Surgery

## 2016-12-31 VITALS — BP 126/75 | HR 74 | Resp 16 | Ht 62.0 in | Wt 175.0 lb

## 2016-12-31 DIAGNOSIS — I712 Thoracic aortic aneurysm, without rupture, unspecified: Secondary | ICD-10-CM

## 2016-12-31 DIAGNOSIS — I719 Aortic aneurysm of unspecified site, without rupture: Secondary | ICD-10-CM

## 2016-12-31 DIAGNOSIS — I77819 Aortic ectasia, unspecified site: Secondary | ICD-10-CM | POA: Diagnosis not present

## 2016-12-31 NOTE — Progress Notes (Signed)
PCP is Leamon Arnt, MD Referring Provider is Leamon Arnt, MD  Chief Complaint  Patient presents with  . 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 It is asymptomatic. The patient has history of CAD, ischemic cardiomyopathy, and most recent ejection fraction was 20-25 percent. Comparing the scans the aortic aneurysm has increased slightly by 2 mm measuring 2.6 x 2.0 cm. It is in the area of fairly heavily calcified vessel wall. The increase in size is minimal and the risk for tear or rupture is also minimal. Continue blood pressure control and lipid therapy with Crestor is her best long-term therapy. With low EF and severe coronary disease she would not be a surgical candidate for arch replacement.  Past Medical History:  Diagnosis Date  . AICD (automatic cardioverter/defibrillator) present    Dr.Allred follows  . Aortic arch pseudoaneurysm (HCC)    a. followed by Dr. Prescott Gum.  . Asthma   . Benign neoplasm of colon   . CAD (coronary artery disease) 02/2009   a. anterior STEMI rx with BMS to prox LAD in 02/2009. b. ISR s/p PTCA 06/2009. c. ISR s/p thrombectomy & PTCA 03/2010 due to late stent thrombosis.   . Chronic renal insufficiency   . Chronic systolic CHF (congestive heart failure) ([place])    a. s/p ST. Jude ICD 2013.  . Colon polyp   . COPD (chronic obstructive pulmonary disease) ([place])   . Diverticulosis   . GI bleed    a. h/o GIB on DAPT, now on ASA only.  Marland Kitchen Headache(784.0)    migraine  . Hearing loss    left ear  . Hyperlipemia   . Hypertension   . Irritable bowel syndrome   . Ischemic cardiomyopathy    EF 10-15%  . Memory loss   . Myocardial infarct [facility]) 2011 x 2   Dr. Roswell Nickel   . Pulmonary embolism ([place]) 2011  . Transfusion history    ?'12 or '13  . Unspecified mastoiditis     Past Surgical History:   Procedure Laterality Date  . ANGIOPLASTY  07/02/09, 04/01/10  . BILATERAL SALPINGOOPHORECTOMY    . CAD( bare metal stent)  02/2009   x 1  . CERVICAL SPINE SURGERY  08/08  . COLONOSCOPY WITH PROPOFOL N/A 04/29/2016   Procedure: COLONOSCOPY WITH PROPOFOL;  Surgeon: [name], MD;  [facility];  Service: Gastroenterology;  Laterality: N/A;  . ESOPHAGOGASTRODUODENOSCOPY N/A 12/09/2016   Procedure: ESOPHAGOGASTRODUODENOSCOPY (EGD);  Surgeon: [name], MD;  Location: [facility];  Service: Gastroenterology;  Laterality: N/A;  . ESOPHAGOGASTRODUODENOSCOPY (EGD) WITH PROPOFOL N/A 04/29/2016   Procedure: ESOPHAGOGASTRODUODENOSCOPY (EGD) WITH PROPOFOL;  Surgeon: [name], MD;  [facility];  Service: Gastroenterology;  Laterality: N/A;  . EUS N/A 05/15/2016   Procedure: UPPER ENDOSCOPIC ULTRASOUND (EUS) RADIAL;  Surgeon: Milus Banister, MD;  [facility];  Service: Endoscopy;  Laterality: N/A;  . IMPLANTABLE CARDIOVERTER DEFIBRILLATOR IMPLANT N/A 08/05/2011   Primary prevention SJM ICD implanted,  Analyze ST study patient  . INNER EAR SURGERY     left x 17  . LUMBAR DISC SURGERY  02/2008   fusion  . TOTAL ABDOMINAL HYSTERECTOMY     complete    Family History  Problem Relation Age of Onset  . Colon cancer Mother   .  Colon cancer Brother   . Bladder Cancer Brother   . Breast cancer Cousin     Social History Social History  Substance Use Topics  . Smoking status: Former Smoker    Packs/day: 0.25    Types: Cigarettes    Quit date: 04/27/2009  . Smokeless tobacco: Never Used  . Alcohol use No    Current Outpatient Prescriptions  Medication Sig Dispense Refill  . albuterol (PROAIR HFA) 108 (90 BASE) MCG/ACT inhaler Inhale 2 puffs into the lungs every 6 (six) hours as needed. For shortness of breath    . amitriptyline (ELAVIL) 50 MG tablet Take 50 mg by mouth at bedtime.    Marland Kitchen aspirin 81 MG tablet Take 81 mg by mouth  every morning.     . bisoprolol (ZEBETA) 5 MG tablet Take 1 tablet (5 mg total) by mouth daily. 30 tablet 11  . DEXILANT 60 MG capsule TAKE 1 CAPSULE BY MOUTH DAILY. 30 capsule 2  . Fluticasone-Salmeterol (ADVAIR) 250-50 MCG/DOSE AEPB Inhale 1 puff into the lungs 2 (two) times daily.    . furosemide (LASIX) 20 MG tablet TAKE 1 TABLET BY MOUTH DAILY. 30 tablet 10  . polyethylene glycol powder (GLYCOLAX/MIRALAX) powder Take 1 Container by mouth daily as needed for mild constipation.   1  . ranitidine (ZANTAC) 150 MG tablet TAKE 1 TABLET BY MOUTH 2 TIMES DAILY. 180 tablet 0  . rosuvastatin (CRESTOR) 20 MG tablet TAKE 1 TABLET BY MOUTH AT BEDTIME. 30 tablet 11  . sacubitril-valsartan (ENTRESTO) 24-26 MG Take 1 tablet by mouth 2 (two) times daily. 180 tablet 2  . topiramate (TOPAMAX) 50 MG tablet Take 1 tablet (50 mg total) by mouth daily. 90 tablet 3   No current facility-administered medications for this visit.     Allergies  Allergen Reactions  . Lisinopril     cough  . Sulfa Antibiotics Other (See Comments)    Unknown, childhood allergy   . Sulfonamide Derivatives Other (See Comments)    UNSURE    Review of Systems  She denies weight gain or weight loss No recent hospitalization for her heart failure She is followed by Dr. [name] cardiology No orthopnea or PND No ankle edema or pain No change in GI symptoms No difficulty swallowing No dental complaints No syncope or falls  BP 126/75 (BP Location: Left Arm, Patient Position: Sitting, Cuff Size: Large)   Pulse 74   Resp 16   Ht 5\' 2"  (1.575 m)   Wt 175 lb (79.4 kg)   SpO2 95% Comment: RA  BMI 32.01 kg/m  Physical Exam       Exam    General- alert and comfortable   Lungs- clear without rales, wheezes   Cor- regular rate and rhythm, no murmur , gallop   Abdomen- soft, non-tender   Extremities - warm, non-tender, minimal edema   Neuro- oriented, appropriate, no focal weakness  Diagnostic Tests:  CT scan  images personally reviewed and counseled with patient The small lesser curve aortic arch aneurysm versus pseudoaneurysm has minimally changed and represents no significant risk to the patient this time   Impression we will continue with surveillance scans and have emphasized importance of blood pressure control to the patient for preventing the aortic pathology to progress:   Plan: Return with CT scan without contrast in year   Len Childs, MD [facility] [phone]

## 2017-01-15 ENCOUNTER — Telehealth: Payer: Self-pay | Admitting: Cardiology

## 2017-01-15 NOTE — Telephone Encounter (Signed)
Spoke w/ pt and requested that he send a manual transmission b/c his home monitor has not updated in at least 7 days.   

## 2017-01-22 ENCOUNTER — Telehealth: Payer: Self-pay | Admitting: Cardiology

## 2017-01-22 NOTE — Telephone Encounter (Signed)
Spoke w/ pt and requested that she send a manual transmission b/c her home monitor has not updated in at least 7 days.   

## 2017-01-29 ENCOUNTER — Telehealth: Payer: Self-pay | Admitting: Cardiology

## 2017-01-29 NOTE — Telephone Encounter (Signed)
LMOVM requesting that pt send manual transmission b/c home monitor has not updated in at least 7 days.    

## 2017-02-16 ENCOUNTER — Ambulatory Visit (INDEPENDENT_AMBULATORY_CARE_PROVIDER_SITE_OTHER): Payer: Medicare Other | Admitting: *Deleted

## 2017-02-16 DIAGNOSIS — I255 Ischemic cardiomyopathy: Secondary | ICD-10-CM | POA: Diagnosis not present

## 2017-02-17 NOTE — Progress Notes (Signed)
Remote ICD transmission.   

## 2017-02-19 ENCOUNTER — Encounter: Payer: Self-pay | Admitting: Cardiology

## 2017-02-23 LAB — CUP PACEART REMOTE DEVICE CHECK
Battery Remaining Longevity: 56 mo
Battery Remaining Percentage: 50 %
Brady Statistic RV Percent Paced: 1 %
Date Time Interrogation Session: 20180724033152
HIGH POWER IMPEDANCE MEASURED VALUE: 90 Ohm
HighPow Impedance: 90 Ohm
Implantable Pulse Generator Implant Date: 20130108
Lead Channel Impedance Value: 530 Ohm
Lead Channel Pacing Threshold Amplitude: 0.75 V
Lead Channel Sensing Intrinsic Amplitude: 12 mV
Lead Channel Setting Pacing Amplitude: 2.5 V
Lead Channel Setting Pacing Pulse Width: 0.5 ms
MDC IDC LEAD IMPLANT DT: 20130108
MDC IDC LEAD LOCATION: 753860
MDC IDC MSMT BATTERY VOLTAGE: 2.95 V
MDC IDC MSMT LEADCHNL RV PACING THRESHOLD PULSEWIDTH: 0.5 ms
MDC IDC PG SERIAL: 1016523
MDC IDC SET LEADCHNL RV SENSING SENSITIVITY: 0.5 mV

## 2017-02-25 ENCOUNTER — Other Ambulatory Visit: Payer: Self-pay

## 2017-02-25 ENCOUNTER — Other Ambulatory Visit: Payer: Self-pay | Admitting: Gastroenterology

## 2017-02-25 DIAGNOSIS — K219 Gastro-esophageal reflux disease without esophagitis: Secondary | ICD-10-CM

## 2017-02-25 MED ORDER — DEXLANSOPRAZOLE 60 MG PO CPDR: 1.0000 | DELAYED_RELEASE_CAPSULE | Freq: Every day | ORAL | 2 refills | Status: DC

## 2017-03-23 ENCOUNTER — Encounter: Payer: Self-pay | Admitting: *Deleted

## 2017-03-23 DIAGNOSIS — Z006 Encounter for examination for normal comparison and control in clinical research program: Secondary | ICD-10-CM

## 2017-03-23 NOTE — Progress Notes (Signed)
RESEARCH ENCOUNTER  Patient ID: [name]  DOB: 05/12/1946  [name] presented to the [facility] for Month 6 of the Be[employer]-HF Research Study.  Denies any adverse events since the last visit. States she is going to go shopping at [employer] after the appointment, but she still gets short of breath and has to take breaks a lot when walking.  Weight remains stable.  Six minute hall walk, labs, and study questionnaires completed. Verbalizes she will call study coordinator with any study related questions. Very pleasant.  Blood pressure 111/62, pulse 71, weight 170 lb (77.1 kg).  Subject will follow up with [facility] in December.

## 2017-04-24 ENCOUNTER — Encounter: Payer: Self-pay | Admitting: Nurse Practitioner

## 2017-04-29 ENCOUNTER — Ambulatory Visit (INDEPENDENT_AMBULATORY_CARE_PROVIDER_SITE_OTHER): Payer: Medicare Other | Admitting: Cardiovascular Disease

## 2017-04-29 ENCOUNTER — Encounter (INDEPENDENT_AMBULATORY_CARE_PROVIDER_SITE_OTHER): Payer: Self-pay

## 2017-04-29 ENCOUNTER — Encounter: Payer: Self-pay | Admitting: Cardiovascular Disease

## 2017-04-29 VITALS — BP 118/70 | HR 78 | Ht 62.0 in | Wt 168.4 lb

## 2017-04-29 DIAGNOSIS — I255 Ischemic cardiomyopathy: Secondary | ICD-10-CM | POA: Diagnosis not present

## 2017-04-29 DIAGNOSIS — I5022 Chronic systolic (congestive) heart failure: Secondary | ICD-10-CM

## 2017-04-29 NOTE — Progress Notes (Signed)
Cardiology Office Note Date:  04/29/2017   ID:  [name], DOB Oct 24, 1945, MRN [id number]  PCP:  Leamon Arnt, MD  Cardiologist:  [name], MD    Chief Complaint  Patient presents with  . Follow-up     History of Present Illness: [name] is a 71 y.o. female who presents for follow-up evaluation. The patient has a history of coronary disease with anterior wall MI. She's had multiple LAD interventions and ultimately she had stent thrombosis in 2011. She's had severe residual left ventricular systolic dysfunction and has undergone ICD implantation. She's been maintained only on low-dose aspirin because of GI bleeding on dual antiplatelet therapy. She was seen by Dr Haroldine Laws in the [facility] who felt she was not a candidate for advanced therapies because of memory problems and COPD. Also has been followed for an aortic pseudoaneurysm of the distal arch by Dr Prescott Gum - this is felt to be stable by serial CT imaging and the patient has been asymptomatic in that regard. Her most recent office visit with Dr Prescott Gum is reviewed today (12-31-16).   She is here alone today. She's had a productive cough since she had her flu vaccine last week. No fever or chills. Reports no change in her breathing - chronically short of breath. She can't walk far or pick up/carry heavy objects. Denies leg swelling or PND. She does have orthopnea when she first lays down but then sleeps flat on no pillows for the night. No symptoms of bendopnea.   Past Medical History:  Diagnosis Date  . AICD (automatic cardioverter/defibrillator) present    Dr.Allred follows  . Aortic arch pseudoaneurysm (HCC)    a. followed by Dr. Prescott Gum.  . Asthma   . Benign neoplasm of colon   . CAD (coronary artery disease) 02/2009   a. anterior STEMI rx with BMS to prox LAD in 02/2009. b. ISR s/p PTCA 06/2009. c. ISR s/p thrombectomy & PTCA 03/2010 due to late stent thrombosis.   . Chronic  renal insufficiency   . Chronic systolic CHF (congestive heart failure) ([place])    a. s/p ST. Jude ICD 2013.  . Colon polyp   . COPD (chronic obstructive pulmonary disease) ([place])   . Diverticulosis   . GI bleed    a. h/o GIB on DAPT, now on ASA only.  Marland Kitchen Headache(784.0)    migraine  . Hearing loss    left ear  . Hyperlipemia   . Hypertension   . Irritable bowel syndrome   . Ischemic cardiomyopathy    EF 10-15%  . Memory loss   . Myocardial infarct [facility]) 2011 x 2   Dr. Ros[place] Nickel   . Pulmonary embolism ([place]) 2011  . Transfusion history    ?'12 or '13  . Unspecified mastoiditis     Past Surgical History:  Procedure Laterality Date  . ANGIOPLASTY  07/02/09, 04/01/10  . BILATERAL SALPINGOOPHORECTOMY    . CAD( bare metal stent)  02/2009   x 1  . CERVICAL SPINE SURGERY  08/08  . COLONOSCOPY WITH PROPOFOL N/A 04/29/2016   Procedure: COLONOSCOPY WITH PROPOFOL;  Surgeon: [name], MD;  Location: [facility];  Service: Gastroenterology;  Laterality: N/A;  . ESOPHAGOGASTRODUODENOSCOPY N/A 12/09/2016   Procedure: ESOPHAGOGASTRODUODENOSCOPY (EGD);  Surgeon: [name], MD;  Location: [facility];  Service: Gastroenterology;  Laterality: N/A;  . ESOPHAGOGASTRODUODENOSCOPY (EGD) WITH PROPOFOL N/A 04/29/2016   Procedure: ESOPHAGOGASTRODUODENOSCOPY (EGD) WITH PROPOFOL;  Surgeon: Remo Lipps  Gaye Alken, MD;  Location: [facility];  Service: Gastroenterology;  Laterality: N/A;  . EUS N/A 05/15/2016   Procedure: UPPER ENDOSCOPIC ULTRASOUND (EUS) RADIAL;  Surgeon: Milus Banister, MD;  Location: [facility];  Service: Endoscopy;  Laterality: N/A;  . IMPLANTABLE CARDIOVERTER DEFIBRILLATOR IMPLANT N/A 08/05/2011   Primary prevention SJM ICD implanted,  Analyze ST study patient  . INNER EAR SURGERY     left x 17  . LUMBAR DISC SURGERY  02/2008   fusion  . TOTAL ABDOMINAL HYSTERECTOMY     complete    Current Outpatient Prescriptions  Medication Sig Dispense  Refill  . albuterol (PROAIR HFA) 108 (90 BASE) MCG/ACT inhaler Inhale 2 puffs into the lungs every 6 (six) hours as needed. For shortness of breath    . amitriptyline (ELAVIL) 50 MG tablet Take 50 mg by mouth at bedtime.    Marland Kitchen aspirin 81 MG tablet Take 81 mg by mouth every morning.     . bisoprolol (ZEBETA) 5 MG tablet Take 1 tablet (5 mg total) by mouth daily. 30 tablet 11  . dexlansoprazole (DEXILANT) 60 MG capsule Take 1 capsule (60 mg total) by mouth daily. 30 capsule 2  . Fluticasone-Salmeterol (ADVAIR) 250-50 MCG/DOSE AEPB Inhale 1 puff into the lungs 2 (two) times daily.    . furosemide (LASIX) 20 MG tablet TAKE 1 TABLET BY MOUTH DAILY. 30 tablet 10  . polyethylene glycol powder (GLYCOLAX/MIRALAX) powder Take 1 Container by mouth daily as needed for mild constipation.   1  . ranitidine (ZANTAC) 150 MG tablet TAKE 1 TABLET BY MOUTH 2 TIMES DAILY. 180 tablet 0  . rosuvastatin (CRESTOR) 20 MG tablet TAKE 1 TABLET BY MOUTH AT BEDTIME. 30 tablet 11  . sacubitril-valsartan (ENTRESTO) 24-26 MG Take 1 tablet by mouth 2 (two) times daily. 180 tablet 2  . topiramate (TOPAMAX) 50 MG tablet Take 1 tablet (50 mg total) by mouth daily. 90 tablet 3   No current facility-administered medications for this visit.     Allergies:   Lisinopril; Sulfa antibiotics; and Sulfonamide derivatives   Social History:  The patient  reports that she quit smoking about 8 years ago. Her smoking use included Cigarettes. She smoked 0.25 packs per day. She has never used smokeless tobacco. She reports that she does not drink alcohol or use drugs.   Family History:  The patient's family history includes Bladder Cancer in her brother; Breast cancer in her cousin; Colon cancer in her brother and mother.   ROS:  Please see the history of present illness.  All other systems are reviewed and negative.   PHYSICAL EXAM: VS:  BP 118/70   Pulse 78   Ht 5\' 2"  (1.575 m)   Wt 76.4 kg (168 lb 6.4 oz)   BMI 30.80 kg/m  , BMI Body  mass index is 30.8 kg/m. GEN: Well nourished, well developed, in no acute distress  HEENT: normal  Neck: no JVD, no masses. No carotid bruits Cardiac: RRR without murmur or gallop                Respiratory:  clear to auscultation bilaterally, prolonged expiration GI: soft, nontender, nondistended, + BS MS: no deformity or atrophy  Ext: no pretibial edema Skin: warm and dry, no rash Neuro:  Strength and sensation are intact Psych: euthymic mood, full affect  EKG:  EKG is ordered today. The ekg ordered today shows NSr 78 bpm,   Recent Labs: No results found for requested labs within last 8760  hours.   Lipid Panel     Component Value Date/Time   CHOL 140 01/15/2016 1345   TRIG 224 (H) 01/15/2016 1345   HDL 52 01/15/2016 1345   CHOLHDL 2.7 01/15/2016 1345   VLDL 45 (H) 01/15/2016 1345   LDLCALC 43 01/15/2016 1345      Wt Readings from Last 3 Encounters:  04/29/17 76.4 kg (168 lb 6.4 oz)  03/23/17 77.1 kg (170 lb)  12/31/16 79.4 kg (175 lb)     Cardiac Studies Reviewed: 2D Echo 09-01-2016: Study Conclusions  - Left ventricle: The cavity size was normal. Wall thickness was normal. Systolic function was severely reduced. The estimated ejection fraction was in the range of 25% to 30%. There is akinesis of the anteroseptal and apical myocardium. Doppler parameters are consistent with abnormal left ventricular relaxation (grade 1 diastolic dysfunction). Doppler parameters are consistent with high ventricular filling pressure. - Left atrium: The atrium was mildly dilated. - Right ventricle: Pacer wire or catheter noted in right ventricle.  Impressions:  - When compared to prior study, EF is mildly improved. (Prior 20-25%)  ASSESSMENT AND PLAN: 1.  Chronic systolic HF, NYHA 3 sx's of exertional dyspnea likely multifactorial with COPD (by exam) and CHF with severe LV dysfunction. Continue bisoprolol and entresto. Recent labs reviewed through [facility].   2. CAD< native vessel, without angina. Continue same Rx.   3. S/p ICD: continue routine FU. No ICD discharges.   4. Thoracic aortic pseudoaneurysm: plans for continued observation.  5. Hyperlipidemia: recent lipids with LDL 62 mg/dL.  6. CKD 3: labs reviewed. Cr 1.6 (stable). Continue entresto  Current medicines are reviewed with the patient today.  The patient does not have concerns regarding medicines.  Labs/ tests ordered today include:   Orders Placed This Encounter  Procedures  . EKG 12-Lead    Disposition:   FU 6 months  Signed, [name], MD  04/29/2017 1:41 PM    [facility] [address], [place], [place]  [address] Phone: [phone]; Fax: [phone]

## 2017-04-29 NOTE — Patient Instructions (Signed)

## 2017-05-05 NOTE — Progress Notes (Signed)
Late entry: Patient present for BeAT-HF Study Week Month 2. She states she feels good today, no adverse events since last research visit. Writer continues to await requested medical records from Dr. Maxie Better, a [facility] doctor. Patient verbalized compliance with low sodium heart failure diet and with medications. Looks well today, no fluid overload noted. Continue to encourage her to inquire on Silver Sneaker or other exercise program; she should consult with Dr. Marcelino Scot before but moving more would benefit her. No other questions or concerns today. Will return in 4 weeks for Research visit Month 3.

## 2017-05-07 ENCOUNTER — Encounter: Payer: Medicare Other | Admitting: Nurse Practitioner

## 2017-05-12 ENCOUNTER — Other Ambulatory Visit: Payer: Self-pay | Admitting: Gastroenterology

## 2017-05-12 DIAGNOSIS — K219 Gastro-esophageal reflux disease without esophagitis: Secondary | ICD-10-CM

## 2017-05-12 NOTE — Telephone Encounter (Signed)
Ok to refill ranitidine 150mg , BID? Pt last seen in office 03/20/16.

## 2017-05-13 ENCOUNTER — Telehealth: Payer: Self-pay

## 2017-05-13 NOTE — Telephone Encounter (Signed)
Rec'd refill request for Zantac. Sent refill and left message for pt to call back. She hasn't been seen in the office since 03-20-16 and SA would like her to make an appt.

## 2017-05-18 ENCOUNTER — Ambulatory Visit (INDEPENDENT_AMBULATORY_CARE_PROVIDER_SITE_OTHER): Payer: Medicare Other | Admitting: *Deleted

## 2017-05-18 DIAGNOSIS — I255 Ischemic cardiomyopathy: Secondary | ICD-10-CM

## 2017-05-18 NOTE — Progress Notes (Signed)
Remote ICD transmission.   

## 2017-05-19 NOTE — Progress Notes (Signed)
Patient present for BeAT-HF study visit Week 6. Denies exacerbation of heart failure symptoms. All study related procedures completed without event. She states "I had colonoscopy with biopsy and everything was negative."  She states she feels well and has no study related questions. Will return in 2 weeks for research study visit.

## 2017-05-20 ENCOUNTER — Ambulatory Visit (INDEPENDENT_AMBULATORY_CARE_PROVIDER_SITE_OTHER): Payer: Medicare Other | Admitting: Gastroenterology

## 2017-05-20 ENCOUNTER — Encounter: Payer: Self-pay | Admitting: Gastroenterology

## 2017-05-20 VITALS — BP 126/72 | HR 86 | Ht 62.0 in | Wt 171.0 lb

## 2017-05-20 DIAGNOSIS — R1032 Left lower quadrant pain: Secondary | ICD-10-CM

## 2017-05-20 DIAGNOSIS — K219 Gastro-esophageal reflux disease without esophagitis: Secondary | ICD-10-CM

## 2017-05-20 DIAGNOSIS — Z006 Encounter for examination for normal comparison and control in clinical research program: Secondary | ICD-10-CM

## 2017-05-20 DIAGNOSIS — Z8719 Personal history of other diseases of the digestive system: Secondary | ICD-10-CM | POA: Diagnosis not present

## 2017-05-20 DIAGNOSIS — K5900 Constipation, unspecified: Secondary | ICD-10-CM | POA: Diagnosis not present

## 2017-05-20 LAB — CUP PACEART REMOTE DEVICE CHECK
Battery Remaining Longevity: 55 mo
Battery Remaining Percentage: 48 %
Battery Voltage: 2.93 V
Date Time Interrogation Session: 20181022080016
HIGH POWER IMPEDANCE MEASURED VALUE: 74 Ohm
HIGH POWER IMPEDANCE MEASURED VALUE: 74 Ohm
Lead Channel Sensing Intrinsic Amplitude: 12 mV
Lead Channel Setting Pacing Amplitude: 2.5 V
MDC IDC LEAD IMPLANT DT: 20130108
MDC IDC LEAD LOCATION: 753860
MDC IDC MSMT LEADCHNL RV IMPEDANCE VALUE: 530 Ohm
MDC IDC MSMT LEADCHNL RV PACING THRESHOLD AMPLITUDE: 0.75 V
MDC IDC MSMT LEADCHNL RV PACING THRESHOLD PULSEWIDTH: 0.5 ms
MDC IDC PG IMPLANT DT: 20130108
MDC IDC PG SERIAL: 1016523
MDC IDC SET LEADCHNL RV PACING PULSEWIDTH: 0.5 ms
MDC IDC SET LEADCHNL RV SENSING SENSITIVITY: 0.5 mV
MDC IDC STAT BRADY RV PERCENT PACED: 1 %

## 2017-05-20 MED ORDER — DICYCLOMINE HCL 10 MG PO CAPS: 10.0000 mg | ORAL_CAPSULE | Freq: Three times a day (TID) | ORAL | 3 refills | Status: DC | PRN

## 2017-05-20 NOTE — Progress Notes (Signed)
HPI :  71 year old female here for a follow-up visit.   She's had a few endoscopies and EUS since her last clinic visit as outlined below. He was noted to have severe esophagitis with multiple duodenal ulcers despite Nexium 40 mg twice daily. She was placed on Dexilant 60 mg twice daily which healed the ulcers and esophagitis. She incidentally was noted to have thickened gastric folds on EGD. Biopsies showed hyperplastic changes initially. EUS was done showing this was mucosal and nothing deeper. Follow up EGD and biopsies showed no hyperplastic changes.  Gastrin level normal. She is now on Dexilant 60mg  once daily, working well for her. She denies any heartburn or upper abdominal pain which has bothered her.   She otherwise complains of lower abdominal pain for the past 2 months. She describes as a cramping feeling which can come and go. She feels it 3-4 times a week, it usually associated with the onset of a bowel movement and is reliably relieved with a bowel movement. She is endorsed constipation lately. She is using MiraLAX but taking it once every 3 days. She has bowel movement roughly once every 3 days which is usually precipitated by taking MiraLAX. She denies any blood in her stools. She denies fevers.  She had a CT scan in 03/2015 for abdominal pain which showed thickening of the antrum but otherwise no other pathology.   Endoscopic history: EGD 04/29/2016 - LA grade C esophagitis, polypoid tissue of the antrum, gastritis, superificial duodenal ulcers - path c/w large hyperplastic polyp Colonoscopy 04/29/2016 - small benign nonadenomatous polyp, small AVM, diverticulosis, hemorrhoids  EUS 05/15/2016 - interval healing of ulcers, mucosal thickening of antrum - path c/w reactive gastritis - not hyperplastic EGD 12/09/2016 - interval healing of esophagitis, thickened gastric folds of the antrum - biopsies show reactive gastropathy, 2 duodenal polyps - benign path   Past Medical History:    Diagnosis Date  . AICD (automatic cardioverter/defibrillator) present    Dr.Allred follows  . Aortic arch pseudoaneurysm (HCC)    a. followed by Dr. Prescott Gum.  . Asthma   . Benign neoplasm of colon   . CAD (coronary artery disease) 02/2009   a. anterior STEMI rx with BMS to prox LAD in 02/2009. b. ISR s/p PTCA 06/2009. c. ISR s/p thrombectomy & PTCA 03/2010 due to late stent thrombosis.   . Chronic renal insufficiency   . Chronic systolic CHF (congestive heart failure) ([place])    a. s/p ST. Jude ICD 2013.  . Colon polyp   . COPD (chronic obstructive pulmonary disease) ([place])   . Diverticulosis   . GI bleed    a. h/o GIB on DAPT, now on ASA only.  Marland Kitchen Headache(784.0)    migraine  . Hearing loss    left ear  . Hyperlipemia   . Hypertension   . Irritable bowel syndrome   . Ischemic cardiomyopathy    EF 10-15%  . Memory loss   . Myocardial infarct [facility]) 2011 x 2   Dr. Roswell Nickel   . Pulmonary embolism ([place]) 2011  . Transfusion history    ?'12 or '13  . Unspecified mastoiditis      Past Surgical History:  Procedure Laterality Date  . ANGIOPLASTY  07/02/09, 04/01/10  . BILATERAL SALPINGOOPHORECTOMY    . CAD( bare metal stent)  02/2009   x 1  . CERVICAL SPINE SURGERY  08/08  . COLONOSCOPY WITH PROPOFOL N/A 04/29/2016   Procedure: COLONOSCOPY WITH PROPOFOL;  Surgeon: [name], MD;  [facility];  Service: Gastroenterology;  Laterality: N/A;  . ESOPHAGOGASTRODUODENOSCOPY N/A 12/09/2016   Procedure: ESOPHAGOGASTRODUODENOSCOPY (EGD);  Surgeon: [name], MD;  Location: [facility];  Service: Gastroenterology;  Laterality: N/A;  . ESOPHAGOGASTRODUODENOSCOPY (EGD) WITH PROPOFOL N/A 04/29/2016   Procedure: ESOPHAGOGASTRODUODENOSCOPY (EGD) WITH PROPOFOL;  Surgeon: [name], MD;  [facility];  Service: Gastroenterology;  Laterality: N/A;  . EUS N/A 05/15/2016   Procedure: UPPER ENDOSCOPIC ULTRASOUND (EUS) RADIAL;  Surgeon:  Milus Banister, MD;  Location: [facility];  Service: Endoscopy;  Laterality: N/A;  . IMPLANTABLE CARDIOVERTER DEFIBRILLATOR IMPLANT N/A 08/05/2011   Primary prevention SJM ICD implanted,  Analyze ST study patient  . INNER EAR SURGERY     left x 17  . LUMBAR DISC SURGERY  02/2008   fusion  . TOTAL ABDOMINAL HYSTERECTOMY     complete   Family History  Problem Relation Age of Onset  . Colon cancer Mother   . Colon cancer Brother   . Bladder Cancer Brother   . Breast cancer Cousin    Social History  Substance Use Topics  . Smoking status: Former Smoker    Packs/day: 0.25    Types: Cigarettes    Quit date: 04/27/2009  . Smokeless tobacco: Never Used  . Alcohol use No   Current Outpatient Prescriptions  Medication Sig Dispense Refill  . albuterol (PROAIR HFA) 108 (90 BASE) MCG/ACT inhaler Inhale 2 puffs into the lungs every 6 (six) hours as needed. For shortness of breath    . amitriptyline (ELAVIL) 50 MG tablet Take 50 mg by mouth at bedtime.    Marland Kitchen aspirin 81 MG tablet Take 81 mg by mouth every morning.     . bisoprolol (ZEBETA) 5 MG tablet Take 1 tablet (5 mg total) by mouth daily. 30 tablet 11  . dexlansoprazole (DEXILANT) 60 MG capsule Take 1 capsule (60 mg total) by mouth daily. 30 capsule 2  . Fluticasone-Salmeterol (ADVAIR) 250-50 MCG/DOSE AEPB Inhale 1 puff into the lungs 2 (two) times daily.    . furosemide (LASIX) 20 MG tablet TAKE 1 TABLET BY MOUTH DAILY. 30 tablet 10  . polyethylene glycol powder (GLYCOLAX/MIRALAX) powder Take 1 Container by mouth daily as needed for mild constipation.   1  . ranitidine (ZANTAC) 150 MG tablet TAKE 1 TABLET BY MOUTH 2 TIMES DAILY. 180 tablet 0  . rosuvastatin (CRESTOR) 20 MG tablet TAKE 1 TABLET BY MOUTH AT BEDTIME. 30 tablet 11  . sacubitril-valsartan (ENTRESTO) 24-26 MG Take 1 tablet by mouth 2 (two) times daily. 180 tablet 2  . topiramate (TOPAMAX) 50 MG tablet Take 1 tablet (50 mg total) by mouth daily. 90 tablet 3   No current  facility-administered medications for this visit.    Allergies  Allergen Reactions  . Lisinopril     cough  . Sulfa Antibiotics Other (See Comments)    Unknown, childhood allergy   . Sulfonamide Derivatives Other (See Comments)    UNSURE     Review of Systems: All systems reviewed and negative except where noted in HPI.   Lab Results  Component Value Date   WBC 6.9 01/15/2016   HGB 12.9 01/15/2016   HCT 38.9 01/15/2016   MCV 98.5 01/15/2016   PLT 216 01/15/2016    Lab Results  Component Value Date   CREATININE 1.56 (H) 01/15/2016   BUN 29 (H) 01/15/2016   NA 143 01/15/2016   K 5.1 01/15/2016   CL 110 01/15/2016   CO2 21 01/15/2016  Lab Results  Component Value Date   ALT 11 01/15/2016   AST 13 01/15/2016   ALKPHOS 83 01/15/2016   BILITOT 0.3 01/15/2016     Physical Exam: BP 126/72   Pulse 86   Ht 5\' 2"  (1.575 m)   Wt 171 lb (77.6 kg)   BMI 31.28 kg/m  Constitutional: Pleasant,well-developed, female in no acute distress. HEENT: Normocephalic and atraumatic. Conjunctivae are normal. No scleral icterus. Neck supple.  Cardiovascular: Normal rate, regular rhythm.  Pulmonary/chest: Effort normal and breath sounds normal. No wheezing, rales or rhonchi. Abdominal: Soft, protuberant, nontender. here are no masses palpable. No hepatomegaly. Extremities: no edema Lymphadenopathy: No cervical adenopathy noted. Neurological: Alert and oriented to person place and time. Skin: Skin is warm and dry. No rashes noted. Psychiatric: Normal mood and affect. Behavior is normal.   ASSESSMENT AND PLAN: 71 year old female here for assessment of the following issues:  Left lower quadrant pain / constipation - I suspect her pain may likely be related to constipation, she has reliable relief of pain with bowel movements and has been constipated recently. Her colonoscopy is up-to-date. Recommend she increase MiraLAX to once daily dosing and titrate as needed to goal of one  bowel movement every day. I will also provide Bentyl 10 mg every 8 hours as needed to see if this helps. If her pain persists despite these interventions asked her to call me back for reassessment. She agreed with the plan  GERD / history duodenal ulcers - surprisingly she had severe esophagitis and multiple duodenal ulcers on high-dose Nexium, she was transitioned to [place] which provided healing of each. Gastrin level was normal. She does have thickening of the antrum for which she had an EUS and shows no concerning findings, repeated biopsies were normal. She will continue Dexalant as currently dosed as its working well for her current symptoms. Follow up as needed for this issue.   Samnorwood Cellar, MD [facility] Gastroenterology Pager [phone]

## 2017-05-20 NOTE — Progress Notes (Signed)
Patient present for Beat HF Study Month 3 visit. No questions or concerns related to study. Doing well and continues to spend extra time with her son and brother, both have been recently diagnosed with cancer. Denies any heart failure exacerbation.

## 2017-05-20 NOTE — Patient Instructions (Signed)
We have sent the following medications to your pharmacy for you to pick up at your convenience: Bentyl 10 mg, Take every 8 hours as needed  Please purchase the following medications over the counter and take as directed: Miralax, take once daily   Continue taking your dexilant  If you are age 69 or older, your body mass index should be between 23-30. Your Body mass index is 31.28 kg/m. If this is out of the aforementioned range listed, please consider follow up with your Primary Care Provider.  If you are age 30 or younger, your body mass index should be between 19-25. Your Body mass index is 31.28 kg/m. If this is out of the aformentioned range listed, please consider follow up with your Primary Care Provider.   Thank you.

## 2017-05-22 ENCOUNTER — Encounter: Payer: Self-pay | Admitting: Cardiology

## 2017-05-25 ENCOUNTER — Encounter: Payer: Self-pay | Admitting: Physician Assistant

## 2017-05-25 ENCOUNTER — Ambulatory Visit (INDEPENDENT_AMBULATORY_CARE_PROVIDER_SITE_OTHER): Payer: Medicare Other | Admitting: Physician Assistant

## 2017-05-25 VITALS — BP 128/84 | HR 98 | Temp 98.3°F | Resp 16 | Ht 62.0 in | Wt 171.0 lb

## 2017-05-25 DIAGNOSIS — R109 Unspecified abdominal pain: Secondary | ICD-10-CM | POA: Diagnosis not present

## 2017-05-25 DIAGNOSIS — R350 Frequency of micturition: Secondary | ICD-10-CM

## 2017-05-25 DIAGNOSIS — N12 Tubulo-interstitial nephritis, not specified as acute or chronic: Secondary | ICD-10-CM

## 2017-05-25 DIAGNOSIS — R300 Dysuria: Secondary | ICD-10-CM | POA: Diagnosis not present

## 2017-05-25 LAB — POCT URINALYSIS DIP (MANUAL ENTRY)
Glucose, UA: 100 mg/dL — AB
Nitrite, UA: POSITIVE — AB
PH UA: 5 (ref 5.0–8.0)
Protein Ur, POC: 30 mg/dL — AB
Spec Grav, UA: 1.025 (ref 1.010–1.025)
Urobilinogen, UA: 2 E.U./dL — AB

## 2017-05-25 MED ORDER — PHENAZOPYRIDINE HCL 200 MG PO TABS: 200.0000 mg | ORAL_TABLET | Freq: Three times a day (TID) | ORAL | 0 refills | Status: DC | PRN

## 2017-05-25 MED ORDER — AMOXICILLIN-POT CLAVULANATE 875-125 MG PO TABS: 1.0000 | ORAL_TABLET | Freq: Two times a day (BID) | ORAL | 0 refills | Status: AC

## 2017-05-25 NOTE — Progress Notes (Signed)
PRIMARY CARE AT [facility] [address] [place] [address] [phone]  Date:  05/25/2017   Name:  [name]   DOB:  1945-10-19   MRN:  [id number]  PCP:  Leamon Arnt, MD    History of Present Illness:  [name] is a 71 y.o. female patient who presents to PCP with  Chief Complaint  Patient presents with  . Urinary Tract Infection    dysuira/ x 1 day     Yesterday, developed lower abdominal pain.  Dysuria, no hematuria or frequency.  No fever.  No abnormal vaginal discharge.   The flank pain started.  She took  She is hydrating with 32 oz of water per day.  Patient Active Problem List   Diagnosis Date Noted  . Abnormal CT scan, stomach   . Gastric polyp   . Headache 01/03/2013  . Pseudoaneurysm of aortic arch ([place]) 05/05/2012  . ICD-St.Jude 08/06/2011  . Chronic systolic heart failure ([place]) 08/05/2011  . Cardiomyopathy, ischemic 06/12/2011  . DDD (degenerative disc disease), lumbar 05/16/2011  . History of pulmonary embolus (PE) 01/02/2011  . UNSPECIFIED ANEMIA 07/03/2010  . HYPERTENSION 09/05/2009  . DIVERTICULOSIS-COLON 08/14/2009  . ABDOMINAL PAIN, LEFT LOWER QUADRANT 08/14/2009  . HYPERLIPIDEMIA 04/24/2009  . CORONARY ARTERY DISEASE 04/24/2009  . DYSPHAGIA UNSPECIFIED 04/24/2009  . PERSONAL HX COLONIC POLYPS 04/24/2009  . TOBACCO ABUSE 04/04/2009  . MASTOIDITIS 10/19/2007  . GERD 10/19/2007  . IBS 10/19/2007  . COLONIC POLYPS, ADENOMATOUS 05/12/2007    Past Medical History:  Diagnosis Date  . AICD (automatic cardioverter/defibrillator) present    Dr.Allred follows  . Aortic arch pseudoaneurysm (HCC)    a. followed by Dr. Prescott Gum.  . Asthma   . Benign neoplasm of colon   . CAD (coronary artery disease) 02/2009   a. anterior STEMI rx with BMS to prox LAD in 02/2009. b. ISR s/p PTCA 06/2009. c. ISR s/p thrombectomy & PTCA 03/2010 due to late stent thrombosis.   . Chronic renal insufficiency   . Chronic systolic CHF (congestive heart  failure) ([place])    a. s/p ST. Jude ICD 2013.  . Colon polyp   . COPD (chronic obstructive pulmonary disease) ([place])   . Diverticulosis   . GI bleed    a. h/o GIB on DAPT, now on ASA only.  Marland Kitchen Headache(784.0)    migraine  . Hearing loss    left ear  . Hyperlipemia   . Hypertension   . Irritable bowel syndrome   . Ischemic cardiomyopathy    EF 10-15%  . Memory loss   . Myocardial infarct [facility]) 2011 x 2   Dr. [place] Nickel   . Pulmonary embolism ([place]) 2011  . Transfusion history    ?'12 or '13  . Unspecified mastoiditis     Past Surgical History:  Procedure Laterality Date  . ANGIOPLASTY  07/02/09, 04/01/10  . BILATERAL SALPINGOOPHORECTOMY    . CAD( bare metal stent)  02/2009   x 1  . CERVICAL SPINE SURGERY  08/08  . COLONOSCOPY WITH PROPOFOL N/A 04/29/2016   Procedure: COLONOSCOPY WITH PROPOFOL;  Surgeon: [name], MD;  Location: [facility];  Service: Gastroenterology;  Laterality: N/A;  . ESOPHAGOGASTRODUODENOSCOPY N/A 12/09/2016   Procedure: ESOPHAGOGASTRODUODENOSCOPY (EGD);  Surgeon: [name], MD;  Location: [facility];  Service: Gastroenterology;  Laterality: N/A;  . ESOPHAGOGASTRODUODENOSCOPY (EGD) WITH PROPOFOL N/A 04/29/2016   Procedure: ESOPHAGOGASTRODUODENOSCOPY (EGD) WITH PROPOFOL;  Surgeon: [name], MD;  Location: [facility];  Service: Gastroenterology;  Laterality: N/A;  . EUS N/A 05/15/2016   Procedure: UPPER ENDOSCOPIC ULTRASOUND (EUS) RADIAL;  Surgeon: Milus Banister, MD;  Location: [facility];  Service: Endoscopy;  Laterality: N/A;  . IMPLANTABLE CARDIOVERTER DEFIBRILLATOR IMPLANT N/A 08/05/2011   Primary prevention SJM ICD implanted,  Analyze ST study patient  . INNER EAR SURGERY     left x 17  . LUMBAR DISC SURGERY  02/2008   fusion  . TOTAL ABDOMINAL HYSTERECTOMY     complete    Social History  Substance Use Topics  . Smoking status: Former Smoker    Packs/day: 0.25    Types: Cigarettes    Quit date:  04/27/2009  . Smokeless tobacco: Never Used  . Alcohol use No    Family History  Problem Relation Age of Onset  . Colon cancer Mother   . Colon cancer Brother   . Bladder Cancer Brother   . Breast cancer Cousin     Allergies  Allergen Reactions  . Lisinopril     cough  . Sulfa Antibiotics Other (See Comments)    Unknown, childhood allergy   . Sulfonamide Derivatives Other (See Comments)    UNSURE    Medication list has been reviewed and updated.  Current Outpatient Prescriptions on File Prior to Visit  Medication Sig Dispense Refill  . albuterol (PROAIR HFA) 108 (90 BASE) MCG/ACT inhaler Inhale 2 puffs into the lungs every 6 (six) hours as needed. For shortness of breath    . amitriptyline (ELAVIL) 50 MG tablet Take 50 mg by mouth at bedtime.    Marland Kitchen aspirin 81 MG tablet Take 81 mg by mouth every morning.     . bisoprolol (ZEBETA) 5 MG tablet Take 1 tablet (5 mg total) by mouth daily. 30 tablet 11  . dexlansoprazole (DEXILANT) 60 MG capsule Take 1 capsule (60 mg total) by mouth daily. 30 capsule 2  . dicyclomine (BENTYL) 10 MG capsule Take 1 capsule (10 mg total) by mouth every 8 (eight) hours as needed for spasms. 30 capsule 3  . Fluticasone-Salmeterol (ADVAIR) 250-50 MCG/DOSE AEPB Inhale 1 puff into the lungs 2 (two) times daily.    . furosemide (LASIX) 20 MG tablet TAKE 1 TABLET BY MOUTH DAILY. 30 tablet 10  . polyethylene glycol powder (GLYCOLAX/MIRALAX) powder Take 1 Container by mouth daily as needed for mild constipation.   1  . ranitidine (ZANTAC) 150 MG tablet TAKE 1 TABLET BY MOUTH 2 TIMES DAILY. 180 tablet 0  . rosuvastatin (CRESTOR) 20 MG tablet TAKE 1 TABLET BY MOUTH AT BEDTIME. 30 tablet 11  . sacubitril-valsartan (ENTRESTO) 24-26 MG Take 1 tablet by mouth 2 (two) times daily. 180 tablet 2  . topiramate (TOPAMAX) 50 MG tablet Take 1 tablet (50 mg total) by mouth daily. 90 tablet 3   No current facility-administered medications on file prior to visit.     ROS ROS  otherwise unremarkable unless listed above.  Physical Examination: BP 128/84   Pulse 98   Temp 98.3 F (36.8 C) (Oral)   Resp 16   Ht 5\' 2"  (1.575 m)   Wt 171 lb (77.6 kg)   SpO2 98%   BMI 31.28 kg/m  Ideal Body Weight: Weight in (lb) to have BMI = 25: 136.4  Physical Exam  Constitutional: She is oriented to person, place, and time. She appears well-developed and well-nourished. No distress.  HENT:  Head: Normocephalic and atraumatic.  Right Ear: External ear normal.  Left Ear: External ear normal.  Eyes: Pupils are equal, round, and reactive to light. Conjunctivae and EOM are normal.  Cardiovascular: Normal rate.   Pulmonary/Chest: Effort normal. No respiratory distress.  Abdominal: Soft. Normal appearance and bowel sounds are normal. There is tenderness in the suprapubic area. There is CVA tenderness (right).  Neurological: She is alert and oriented to person, place, and time.  Skin: She is not diaphoretic.  Psychiatric: She has a normal mood and affect. Her behavior is normal.     Results for orders placed or performed in visit on 05/25/17  POCT urinalysis dipstick  Result Value Ref Range   Color, UA orange (A) yellow   Clarity, UA cloudy (A) clear   Glucose, UA =100 (A) negative mg/dL   Bilirubin, UA small (A) negative   Ketones, POC UA trace (5) (A) negative mg/dL   Spec Grav, UA 1.025 1.010 - 1.025   Blood, UA trace-lysed (A) negative   pH, UA 5.0 5.0 - 8.0   Protein Ur, POC =30 (A) negative mg/dL   Urobilinogen, UA 2.0 (A) 0.2 or 1.0 E.U./dL   Nitrite, UA Positive (A) Negative   Leukocytes, UA Trace (A) Negative   \  Assessment and Plan: [name] is a 71 y.o. female who is here today for cc of  Chief Complaint  Patient presents with  . Urinary Tract Infection    dysuira/ x 1 day   Reviewed history--enterobacter clocaie, and e coli in the last 6 months.  Appears to be treated with Macrobid.  Given Augmentin for 10 days.  Due to risk of qt  prolongation, and sulfur allergies, and kidney function--will treat with Augmentin.  Follow up with PCP, or [place] as needed. Advised tylenol and pyridium short term Pyelonephritis - Plan: Urine Culture  Urinary frequency - Plan: amoxicillin-clavulanate (AUGMENTIN) 875-125 MG tablet, Urine Culture  Dysuria - Plan: POCT urinalysis dipstick, amoxicillin-clavulanate (AUGMENTIN) 875-125 MG tablet, Urine Culture  Flank pain - Plan: amoxicillin-clavulanate (AUGMENTIN) 875-125 MG tablet, Urine Culture  Ivar Drape, PA-C [facility] 10/29/20181:23 PM

## 2017-05-25 NOTE — Patient Instructions (Addendum)
Pyelonephritis, Adult Pyelonephritis is a kidney infection. The kidneys are organs that help clean your blood by moving waste out of your blood and into your pee (urine). This infection can happen quickly, or it can last for a long time. In most cases, it clears up with treatment and does not cause other problems. Follow these instructions at home: Medicines  Take over-the-counter and prescription medicines only as told by your doctor.  Take your antibiotic medicine as told by your doctor. Do not stop taking the medicine even if you start to feel better. General instructions  Drink enough fluid to keep your pee clear or pale yellow.  Avoid caffeine, tea, and carbonated drinks.  Pee (urinate) often. Avoid holding in pee for long periods of time.  Pee before and after sex.  After pooping (having a bowel movement), women should wipe from front to back. Use each tissue only once.  Keep all follow-up visits as told by your doctor. This is important. Contact a doctor if:  You do not feel better after 2 days.  Your symptoms get worse.  You have a fever. Get help right away if:  You cannot take your medicine or drink fluids as told.  You have chills and shaking.  You throw up (vomit).  You have very bad pain in your side (flank) or back.  You feel very weak or you pass out (faint). This information is not intended to replace advice given to you by your health care provider. Make sure you discuss any questions you have with your health care provider. Document Released: 08/21/2004 Document Revised: 12/20/2015 Document Reviewed: 11/06/2014 Elsevier Interactive Patient Education  2018 [employer].     IF you received an x-ray today, you will receive an invoice from [facility]. Please contact [facility] at [phone] with questions or concerns regarding your invoice.   IF you received labwork today, you will receive an invoice from [facility]. Please contact  [facility] at [phone] with questions or concerns regarding your invoice.   Our billing staff will not be able to assist you with questions regarding bills from these companies.  You will be contacted with the lab results as soon as they are available. The fastest way to get your results is to activate your My Chart account. Instructions are located on the last page of this paperwork. If you have not heard from [place] regarding the results in 2 weeks, please contact this office.

## 2017-05-27 LAB — URINE CULTURE

## 2017-06-02 ENCOUNTER — Encounter: Payer: Self-pay | Admitting: Physician Assistant

## 2017-06-02 ENCOUNTER — Ambulatory Visit (INDEPENDENT_AMBULATORY_CARE_PROVIDER_SITE_OTHER): Payer: Medicare Other | Admitting: Physician Assistant

## 2017-06-02 ENCOUNTER — Other Ambulatory Visit: Payer: Self-pay | Admitting: Gastroenterology

## 2017-06-02 VITALS — BP 120/86 | HR 70 | Temp 97.8°F | Resp 16 | Ht 63.0 in | Wt 172.2 lb

## 2017-06-02 DIAGNOSIS — Z87440 Personal history of urinary (tract) infections: Secondary | ICD-10-CM | POA: Diagnosis not present

## 2017-06-02 DIAGNOSIS — R1030 Lower abdominal pain, unspecified: Secondary | ICD-10-CM

## 2017-06-02 DIAGNOSIS — N3010 Interstitial cystitis (chronic) without hematuria: Secondary | ICD-10-CM | POA: Diagnosis not present

## 2017-06-02 DIAGNOSIS — K219 Gastro-esophageal reflux disease without esophagitis: Secondary | ICD-10-CM

## 2017-06-02 LAB — POCT CBC
Granulocyte percent: 42.9 % (ref 37–80)
HCT, POC: 36.4 % — AB (ref 37.7–47.9)
Hemoglobin: 12.2 g/dL (ref 12.2–16.2)
Lymph, poc: 3.9 — AB (ref 0.6–3.4)
MCH, POC: 32.8 pg — AB (ref 27–31.2)
MCHC: 33.4 g/dL (ref 31.8–35.4)
MCV: 98.2 fL — AB (ref 80–97)
MID (cbc): 0.4 (ref 0–0.9)
MPV: 6.9 fL (ref 0–99.8)
POC Granulocyte: 3.3 (ref 2–6.9)
POC LYMPH PERCENT: 51.2 % — AB (ref 10–50)
POC MID %: 5.9 %M (ref 0–12)
Platelet Count, POC: 212 10*3/uL (ref 142–424)
RBC: 3.71 M/uL — AB (ref 4.04–5.48)
RDW, POC: 14.5 %
WBC: 7.6 10*3/uL (ref 4.6–10.2)

## 2017-06-02 LAB — POCT URINALYSIS DIP (MANUAL ENTRY)
Bilirubin, UA: NEGATIVE
Blood, UA: NEGATIVE
Glucose, UA: NEGATIVE mg/dL
Ketones, POC UA: NEGATIVE mg/dL
Leukocytes, UA: NEGATIVE
Nitrite, UA: POSITIVE — AB
Protein Ur, POC: NEGATIVE mg/dL
Spec Grav, UA: 1.02 (ref 1.010–1.025)
Urobilinogen, UA: 0.2 E.U./dL
pH, UA: 7 (ref 5.0–8.0)

## 2017-06-02 LAB — POC MICROSCOPIC URINALYSIS (UMFC): Mucus: ABSENT

## 2017-06-02 MED ORDER — PHENAZOPYRIDINE HCL 200 MG PO TABS: 200.0000 mg | ORAL_TABLET | Freq: Three times a day (TID) | ORAL | 0 refills | Status: DC | PRN

## 2017-06-02 NOTE — Patient Instructions (Addendum)
Continue taking Augmentin for your urinary tract infection. Come back in 2-3 days. We will recheck your urine to make sure the infection has cleared.  Drink at least 32 oz water daily.  Do not hold your urine if you have to urinate.   Thank you for coming in today. I hope you feel we met your needs.  Feel free to call PCP if you have any questions or further requests.  Please consider signing up for MyChart if you do not already have it, as this is a great way to communicate with me.  Best,  Whitney McVey, PA-C    IF you received an x-ray today, you will receive an invoice from [facility]. Please contact [facility] at [phone] with questions or concerns regarding your invoice.   IF you received labwork today, you will receive an invoice from [place]. Please contact [facility] at [phone] with questions or concerns regarding your invoice.   Our billing staff will not be able to assist you with questions regarding bills from these companies.  You will be contacted with the lab results as soon as they are available. The fastest way to get your results is to activate your My Chart account. Instructions are located on the last page of this paperwork. If you have not heard from [place] regarding the results in 2 weeks, please contact this office.

## 2017-06-02 NOTE — Progress Notes (Signed)
[name]  MRN: [id number] DOB: 07/09/1946  PCP: Leamon Arnt, MD  Subjective:  Pt is a 71 year old female who presents to clinic for f/u kidney infection.  She was here 10/29 seen by my colleague PA Ivar Drape - Plan: "enterobacter clocaie, and e coli in the last 6 months.  Appears to be treated with Macrobid.  Given Augmentin for 10 days.  Due to risk of qt prolongation, and sulfur allergies, and kidney function--will treat with Augmentin."  Today she c/o lower abdominal pain -  C/o pain "all the time" in her lower belly. Feels like her UTI is not gone. She has two days left on her Augmentin. Denies fever, chills, flank pain, nausea, enuresis.    This is her third UTI in about 6 months.   Review of Systems  Constitutional: Negative for chills, diaphoresis, fatigue and fever.  Cardiovascular: Negative for chest pain and palpitations.  Gastrointestinal: Positive for abdominal pain. Negative for diarrhea, nausea and vomiting.  Genitourinary: Negative for decreased urine volume, difficulty urinating, dysuria, enuresis, flank pain, frequency, hematuria, urgency, vaginal bleeding and vaginal pain.  Neurological: Negative for dizziness and weakness.    Patient Active Problem List   Diagnosis Date Noted  . Abnormal CT scan, stomach   . Gastric polyp   . Headache 01/03/2013  . Pseudoaneurysm of aortic arch ([place]) 05/05/2012  . ICD-St.Jude 08/06/2011  . Chronic systolic heart failure ([place]) 08/05/2011  . Cardiomyopathy, ischemic 06/12/2011  . DDD (degenerative disc disease), lumbar 05/16/2011  . History of pulmonary embolus (PE) 01/02/2011  . UNSPECIFIED ANEMIA 07/03/2010  . HYPERTENSION 09/05/2009  . DIVERTICULOSIS-COLON 08/14/2009  . ABDOMINAL PAIN, LEFT LOWER QUADRANT 08/14/2009  . HYPERLIPIDEMIA 04/24/2009  . CORONARY ARTERY DISEASE 04/24/2009  . DYSPHAGIA UNSPECIFIED 04/24/2009  . PERSONAL HX COLONIC POLYPS 04/24/2009  . TOBACCO ABUSE 04/04/2009  . MASTOIDITIS  10/19/2007  . GERD 10/19/2007  . IBS 10/19/2007  . COLONIC POLYPS, ADENOMATOUS 05/12/2007    Current Outpatient Medications on File Prior to Visit  Medication Sig Dispense Refill  . albuterol (PROAIR HFA) 108 (90 BASE) MCG/ACT inhaler Inhale 2 puffs into the lungs every 6 (six) hours as needed. For shortness of breath    . amitriptyline (ELAVIL) 50 MG tablet Take 50 mg by mouth at bedtime.    Marland Kitchen amoxicillin-clavulanate (AUGMENTIN) 875-125 MG tablet Take 1 tablet by mouth 2 (two) times daily. 20 tablet 0  . aspirin 81 MG tablet Take 81 mg by mouth every morning.     . bisoprolol (ZEBETA) 5 MG tablet Take 1 tablet (5 mg total) by mouth daily. 30 tablet 11  . dexlansoprazole (DEXILANT) 60 MG capsule Take 1 capsule (60 mg total) by mouth daily. 30 capsule 2  . dicyclomine (BENTYL) 10 MG capsule Take 1 capsule (10 mg total) by mouth every 8 (eight) hours as needed for spasms. 30 capsule 3  . Fluticasone-Salmeterol (ADVAIR) 250-50 MCG/DOSE AEPB Inhale 1 puff into the lungs 2 (two) times daily.    . furosemide (LASIX) 20 MG tablet TAKE 1 TABLET BY MOUTH DAILY. 30 tablet 10  . phenazopyridine (PYRIDIUM) 200 MG tablet Take 1 tablet (200 mg total) by mouth 3 (three) times daily as needed for pain. 10 tablet 0  . polyethylene glycol powder (GLYCOLAX/MIRALAX) powder Take 1 Container by mouth daily as needed for mild constipation.   1  . ranitidine (ZANTAC) 150 MG tablet TAKE 1 TABLET BY MOUTH 2 TIMES DAILY. 180 tablet 0  . rosuvastatin (CRESTOR) 20  MG tablet TAKE 1 TABLET BY MOUTH AT BEDTIME. 30 tablet 11  . sacubitril-valsartan (ENTRESTO) 24-26 MG Take 1 tablet by mouth 2 (two) times daily. 180 tablet 2  . topiramate (TOPAMAX) 50 MG tablet Take 1 tablet (50 mg total) by mouth daily. 90 tablet 3   No current facility-administered medications on file prior to visit.     Allergies  Allergen Reactions  . Lisinopril     cough  . Sulfa Antibiotics Other (See Comments)    Unknown, childhood allergy   .  Sulfonamide Derivatives Other (See Comments)    UNSURE     Objective:  BP 120/86   Pulse 70   Temp 97.8 F (36.6 C) (Oral)   Resp 16   Ht 5\' 3"  (1.6 m)   Wt 172 lb 3.2 oz (78.1 kg)   SpO2 98%   BMI 30.50 kg/m   Physical Exam  Constitutional: She is oriented to person, place, and time and well-developed, well-nourished, and in no distress. No distress.  Cardiovascular: Normal rate, regular rhythm and normal heart sounds.  Abdominal: Soft. Normal appearance. There is tenderness in the suprapubic area. There is no CVA tenderness.  Neurological: She is alert and oriented to person, place, and time. GCS score is 15.  Skin: Skin is warm and dry.  Psychiatric: Mood, memory, affect and judgment normal.  Vitals reviewed.   Results for orders placed or performed in visit on 06/02/17  POCT urinalysis dipstick  Result Value Ref Range   Color, UA yellow yellow   Clarity, UA clear clear   Glucose, UA negative negative mg/dL   Bilirubin, UA negative negative   Ketones, POC UA negative negative mg/dL   Spec Grav, UA 1.020 1.010 - 1.025   Blood, UA negative negative   pH, UA 7.0 5.0 - 8.0   Protein Ur, POC negative negative mg/dL   Urobilinogen, UA 0.2 0.2 or 1.0 E.U./dL   Nitrite, UA Positive (A) Negative   Leukocytes, UA Negative Negative  POCT Microscopic Urinalysis ([facility])  Result Value Ref Range   WBC,UR,HPF,POC None None WBC/hpf   RBC,UR,HPF,POC None None RBC/hpf   Bacteria None None, Too numerous to count   Mucus Absent Absent   Epithelial Cells, UR Per Microscopy None None, Too numerous to count cells/hpf  POCT CBC  Result Value Ref Range   WBC 7.6 4.6 - 10.2 K/uL   Lymph, poc 3.9 (A) 0.6 - 3.4   POC LYMPH PERCENT 51.2 (A) 10 - 50 %L   MID (cbc) 0.4 0 - 0.9   POC MID % 5.9 0 - 12 %M   POC Granulocyte 3.3 2 - 6.9   Granulocyte percent 42.9 37 - 80 %G   RBC 3.71 (A) 4.04 - 5.48 M/uL   Hemoglobin 12.2 12.2 - 16.2 g/dL   HCT, POC 36.4 (A) 37.7 - 47.9 %   MCV 98.2 (A) 80  - 97 fL   MCH, POC 32.8 (A) 27 - 31.2 pg   MCHC 33.4 31.8 - 35.4 g/dL   RDW, POC 14.5 %   Platelet Count, POC 212 142 - 424 K/uL   MPV 6.9 0 - 99.8 fL     Assessment and Plan :  1. Lower abdominal pain 2. Interstitial cystitis - POCT urinalysis dipstick - POCT Microscopic Urinalysis ([facility]) - Urine Culture - POCT CBC - phenazopyridine (PYRIDIUM) 200 MG tablet; Take 1 tablet (200 mg total) 3 (three) times daily as needed by mouth for pain.  Dispense: 10 tablet; Refill:  0  3. History of kidney infection - Ambulatory referral to Urology - POCT Microscopic Urinalysis ([facility]); Future - POCT urinalysis dipstick; Future - Urine Culture; Future - pt presents for f/u pyelonephritis. She was treated a week and a half ago. Today c/o lower abdominal pain. Her urine has significantly improved since her last OV, +nitrites, WBC count is wnl, vitals wnl. Suspect resolving UTI. Will have her provide urine sample when she finishes course of antibiotics, as she does not want to come back again. Order placed for lab only future UA and culture. Referral made for urology, as this is her third UTI in about 6 months.    Mercer Pod, PA-C  Primary Care at [facility] 06/02/2017 10:46 AM

## 2017-06-03 LAB — URINE CULTURE: Organism ID, Bacteria: NO GROWTH

## 2017-06-04 ENCOUNTER — Ambulatory Visit (INDEPENDENT_AMBULATORY_CARE_PROVIDER_SITE_OTHER): Payer: Medicare Other | Admitting: Urgent Care

## 2017-06-04 DIAGNOSIS — Z87440 Personal history of urinary (tract) infections: Secondary | ICD-10-CM | POA: Diagnosis not present

## 2017-06-04 LAB — POC MICROSCOPIC URINALYSIS (UMFC): Mucus: ABSENT

## 2017-06-04 LAB — POCT URINALYSIS DIP (MANUAL ENTRY)
Bilirubin, UA: NEGATIVE
Blood, UA: NEGATIVE
Glucose, UA: 100 mg/dL — AB
Ketones, POC UA: NEGATIVE mg/dL
Leukocytes, UA: NEGATIVE
Nitrite, UA: POSITIVE — AB
Protein Ur, POC: NEGATIVE mg/dL
Spec Grav, UA: 1.015 (ref 1.010–1.025)
Urobilinogen, UA: 1 U/dL
pH, UA: 5 (ref 5.0–8.0)

## 2017-06-04 NOTE — Progress Notes (Signed)
[place]   

## 2017-06-05 LAB — URINE CULTURE

## 2017-06-08 ENCOUNTER — Other Ambulatory Visit: Payer: Self-pay

## 2017-06-08 ENCOUNTER — Encounter: Payer: Self-pay | Admitting: Family Medicine

## 2017-06-08 ENCOUNTER — Ambulatory Visit (INDEPENDENT_AMBULATORY_CARE_PROVIDER_SITE_OTHER): Payer: Medicare Other | Admitting: Family Medicine

## 2017-06-08 VITALS — BP 134/76 | HR 95 | Temp 98.0°F | Resp 18 | Ht 62.99 in | Wt 167.4 lb

## 2017-06-08 DIAGNOSIS — K581 Irritable bowel syndrome with constipation: Secondary | ICD-10-CM

## 2017-06-08 DIAGNOSIS — R300 Dysuria: Secondary | ICD-10-CM | POA: Diagnosis not present

## 2017-06-08 DIAGNOSIS — R81 Glycosuria: Secondary | ICD-10-CM

## 2017-06-08 DIAGNOSIS — Z0000 Encounter for general adult medical examination without abnormal findings: Secondary | ICD-10-CM

## 2017-06-08 DIAGNOSIS — I10 Essential (primary) hypertension: Secondary | ICD-10-CM | POA: Diagnosis not present

## 2017-06-08 LAB — POCT URINALYSIS DIP (MANUAL ENTRY)
Blood, UA: NEGATIVE
Glucose, UA: 100 mg/dL — AB
Nitrite, UA: POSITIVE — AB
Protein Ur, POC: 30 mg/dL — AB
Spec Grav, UA: 1.02 (ref 1.010–1.025)
Urobilinogen, UA: 1 E.U./dL
pH, UA: 5 (ref 5.0–8.0)

## 2017-06-08 LAB — POCT WET + KOH PREP
Trich by wet prep: ABSENT
Yeast by KOH: ABSENT
Yeast by wet prep: ABSENT

## 2017-06-08 MED ORDER — CIPROFLOXACIN HCL 500 MG PO TABS: 500.0000 mg | ORAL_TABLET | Freq: Two times a day (BID) | ORAL | 0 refills | Status: DC

## 2017-06-08 NOTE — Patient Instructions (Signed)
     IF you received an x-ray today, you will receive an invoice from [facility]. Please contact [facility] at [phone] with questions or concerns regarding your invoice.   IF you received labwork today, you will receive an invoice from [facility]. Please contact [facility] at [phone] with questions or concerns regarding your invoice.   Our billing staff will not be able to assist you with questions regarding bills from these companies.  You will be contacted with the lab results as soon as they are available. The fastest way to get your results is to activate your My Chart account. Instructions are located on the last page of this paperwork. If you have not heard from us regarding the results in 2 weeks, please contact this office.     

## 2017-06-08 NOTE — Progress Notes (Signed)
11/12/201812:35 PM  [name] May 06, 1946, 71 y.o. female [id number]  Chief Complaint  Patient presents with  . Establish Care    HPI:   Patient is a 71 y.o. female with past medical history significant for MI x 2, CHF and pacemaker;  COPD - previous > 30 year pack smoking history; quit with her second MI; unprovoked PE; and IBS constipation predominant who presents today to establish care.  Her previous PCP, Dr Billey Chang, has moved.  She continues to see all cardiologist: [name], MD (Cardiology) Prescott Gum, Collier Salina, MD as Attending Physician (Cardiothoracic Surgery)  She reports colonoscopy in 2017.  She otherwise has main concern for non-resolving dysuria since 10/29, she was treated for pyelonephritis with Augmentin, urine culture positive. Completed course, but had continued symptoms, treated wit pyridium. Still having dysuria, urgency and suprapubic pain. Denies fever, chills, nausea, vomiting, flank pain.  Depression screen [facility] 2/9 06/08/2017 06/02/2017 05/25/2017  Decreased Interest 0 0 0  Down, Depressed, Hopeless 0 0 0  PHQ - 2 Score 0 0 0  Some recent data might be hidden    Allergies  Allergen Reactions  . Lisinopril     cough  . Sulfa Antibiotics Other (See Comments)    Unknown, childhood allergy   . Sulfonamide Derivatives Other (See Comments)    UNSURE    Prior to Admission medications   Medication Sig Start Date End Date Taking? Authorizing Provider  albuterol (PROAIR HFA) 108 (90 BASE) MCG/ACT inhaler Inhale 2 puffs into the lungs every 6 (six) hours as needed. For shortness of breath   Yes [provider]  amitriptyline (ELAVIL) 50 MG tablet Take 50 mg by mouth at bedtime. 01/10/13  Yes Inda Castle, MD  aspirin 81 MG tablet Take 81 mg by mouth every morning.    Yes [provider]  bisoprolol (ZEBETA) 5 MG tablet Take 1 tablet (5 mg total) by mouth daily. 09/25/16  Yes [name], MD  DEXILANT 60 MG capsule TAKE  1 CAPSULE BY MOUTH DAILY. 06/02/17  Yes Armbruster, Carlota Raspberry, MD  dicyclomine (BENTYL) 10 MG capsule Take 1 capsule (10 mg total) by mouth every 8 (eight) hours as needed for spasms. 05/20/17  Yes Armbruster, Carlota Raspberry, MD  Fluticasone-Salmeterol (ADVAIR) 250-50 MCG/DOSE AEPB Inhale 1 puff into the lungs 2 (two) times daily. 12/08/14  Yes [provider]  furosemide (LASIX) 20 MG tablet TAKE 1 TABLET BY MOUTH DAILY. 12/25/16  Yes Allred, Jeneen Rinks, MD  phenazopyridine (PYRIDIUM) 200 MG tablet Take 1 tablet (200 mg total) 3 (three) times daily as needed by mouth for pain. 06/02/17  Yes McVey, Gelene Mink, PA-C  polyethylene glycol powder (GLYCOLAX/MIRALAX) powder Take 1 Container by mouth daily as needed for mild constipation.  05/30/14  Yes [provider]  ranitidine (ZANTAC) 150 MG tablet TAKE 1 TABLET BY MOUTH 2 TIMES DAILY. 05/13/17  Yes Armbruster, Carlota Raspberry, MD  rosuvastatin (CRESTOR) 20 MG tablet TAKE 1 TABLET BY MOUTH AT BEDTIME. 06/10/16  Yes [name], MD  sacubitril-valsartan (ENTRESTO) 24-26 MG Take 1 tablet by mouth 2 (two) times daily. 09/24/16  Yes Allred, Jeneen Rinks, MD  topiramate (TOPAMAX) 50 MG tablet Take 1 tablet (50 mg total) by mouth daily. 08/26/16  Yes Dennie Bible, NP    Past Medical History:  Diagnosis Date  . AICD (automatic cardioverter/defibrillator) present    Dr.Allred follows  . Aortic arch pseudoaneurysm (HCC)    a. followed by Dr. Prescott Gum.  . Asthma   .  Benign neoplasm of colon   . CAD (coronary artery disease) 02/2009   a. anterior STEMI rx with BMS to prox LAD in 02/2009. b. ISR s/p PTCA 06/2009. c. ISR s/p thrombectomy & PTCA 03/2010 due to late stent thrombosis.   . Chronic renal insufficiency   . Chronic systolic CHF (congestive heart failure) ([place])    a. s/p ST. Jude ICD 2013.  . Colon polyp   . COPD (chronic obstructive pulmonary disease) ([place])   . Diverticulosis   . GI bleed    a. h/o GIB on DAPT, now on ASA only.  Marland Kitchen  Headache(784.0)    migraine  . Hearing loss    left ear  . Hyperlipemia   . Hypertension   . Irritable bowel syndrome   . Ischemic cardiomyopathy    EF 10-15%  . Memory loss   . Myocardial infarct [facility]) 2011 x 2   Dr. Roswell Nickel   . Pulmonary embolism ([place]) 2011  . Transfusion history    ?'12 or '13  . Unspecified mastoiditis     Past Surgical History:  Procedure Laterality Date  . ANGIOPLASTY  07/02/09, 04/01/10  . BILATERAL SALPINGOOPHORECTOMY    . CAD( bare metal stent)  02/2009   x 1  . CERVICAL SPINE SURGERY  08/08  . INNER EAR SURGERY     left x 17  . LUMBAR DISC SURGERY  02/2008   fusion  . TOTAL ABDOMINAL HYSTERECTOMY     complete    Social History   Tobacco Use  . Smoking status: Former Smoker    Packs/day: 0.25    Types: Cigarettes    Last attempt to quit: 04/27/2009    Years since quitting: 8.1  . Smokeless tobacco: Never Used  Substance Use Topics  . Alcohol use: No    Alcohol/week: 0.0 oz    Family History  Problem Relation Age of Onset  . Colon cancer Mother   . Colon cancer Brother   . Bladder Cancer Brother   . Breast cancer Cousin     Review of Systems  Constitutional: Negative for chills and fever.  HENT: Negative for congestion, ear pain and sore throat.   Eyes: Negative for blurred vision and double vision.  Respiratory: Negative for cough and shortness of breath.   Cardiovascular: Negative for chest pain, palpitations and leg swelling.  Gastrointestinal: Positive for constipation. Negative for abdominal pain, nausea and vomiting.  Genitourinary: Positive for dysuria and urgency. Negative for flank pain and hematuria.  Musculoskeletal: Negative for myalgias.  Neurological: Negative for sensory change, speech change and focal weakness.  Endo/Heme/Allergies: Negative for polydipsia.  Psychiatric/Behavioral: Negative for depression. The patient is not nervous/anxious.      OBJECTIVE:  Blood pressure 134/76, pulse 95,  temperature 98 F (36.7 C), temperature source Oral, resp. rate 18, height 5' 2.99" (1.6 m), weight 167 lb 6.4 oz (75.9 kg), SpO2 98 %.  Physical Exam  Constitutional: She is oriented to person, place, and time and well-developed, well-nourished, and in no distress.  HENT:  Head: Normocephalic and atraumatic.  Right Ear: Hearing, tympanic membrane, external ear and ear canal normal.  Left Ear: Hearing, tympanic membrane, external ear and ear canal normal.  Mouth/Throat: Oropharynx is clear and moist.  Eyes: EOM are normal. Pupils are equal, round, and reactive to light.  Neck: Neck supple. No thyromegaly present.  Cardiovascular: Normal rate, regular rhythm, normal heart sounds and intact distal pulses. Exam reveals no gallop and no friction rub.  No murmur heard.  Pulmonary/Chest: Effort normal and breath sounds normal. She has no wheezes. She has no rales.  Abdominal: Soft. Bowel sounds are normal. She exhibits no distension and no mass. There is no hepatosplenomegaly. There is no tenderness. There is no CVA tenderness.  Musculoskeletal: Normal range of motion. She exhibits no edema.  Lymphadenopathy:    She has no cervical adenopathy.  Neurological: She is alert and oriented to person, place, and time. She has normal reflexes. Gait normal.  Skin: Skin is warm and dry.  Psychiatric: Mood and affect normal.  Nursing note and vitals reviewed.   Results for orders placed or performed in visit on 06/08/17 (from the past 24 hour(s))  POCT Wet + KOH Prep     Status: Abnormal   Collection Time: 06/08/17 12:30 PM  Result Value Ref Range   Yeast by KOH Absent Absent   Yeast by wet prep Absent Absent   WBC by wet prep Few Few   Clue Cells Wet Prep HPF POC None None   Trich by wet prep Absent Absent   Bacteria Wet Prep HPF POC Many (A) Few   Epithelial Cells By [employer] Pref ([facility]) Moderate (A) None, Few, Too numerous to count   RBC,UR,HPF,POC None None RBC/hpf  CBC with Differential      Status: Abnormal   Collection Time: 06/08/17 12:30 PM  Result Value Ref Range   WBC 7.4 3.4 - 10.8 x10E3/uL   RBC 3.75 (L) 3.77 - 5.28 x10E6/uL   Hemoglobin 12.2 11.1 - 15.9 g/dL   Hematocrit 36.5 34.0 - 46.6 %   MCV 97 79 - 97 fL   MCH 32.5 26.6 - 33.0 pg   MCHC 33.4 31.5 - 35.7 g/dL   RDW 14.5 12.3 - 15.4 %   Platelets 232 150 - 379 x10E3/uL   Neutrophils 41 Not Estab. %   Lymphs 51 Not Estab. %   Monocytes 6 Not Estab. %   Eos 2 Not Estab. %   Basos 0 Not Estab. %   Neutrophils Absolute 3.0 1.4 - 7.0 x10E3/uL   Lymphocytes Absolute 3.8 (H) 0.7 - 3.1 x10E3/uL   Monocytes Absolute 0.5 0.1 - 0.9 x10E3/uL   EOS (ABSOLUTE) 0.1 0.0 - 0.4 x10E3/uL   Basophils Absolute 0.0 0.0 - 0.2 x10E3/uL   Immature Granulocytes 0 Not Estab. %   Immature Grans (Abs) 0.0 0.0 - 0.1 x10E3/uL   Narrative   Performed at:  [address], [place], [place]  [id number] Lab Director: Rush Farmer MD, Phone:  [phone]  Comprehensive metabolic panel     Status: Abnormal   Collection Time: 06/08/17 12:30 PM  Result Value Ref Range   Glucose 86 65 - 99 mg/dL   BUN 20 8 - 27 mg/dL   Creatinine, Ser 0.60 0.57 - 1.00 mg/dL   GFR calc non Af Amer 93 >59 mL/min/1.73   GFR calc Af Amer 108 >59 mL/min/1.73   BUN/Creatinine Ratio 33 (H) 12 - 28   Sodium 140 134 - 144 mmol/L   Potassium 4.8 3.5 - 5.2 mmol/L   Chloride 100 96 - 106 mmol/L   CO2 23 20 - 29 mmol/L   Calcium 9.9 8.7 - 10.3 mg/dL   Total Protein 7.7 6.0 - 8.5 g/dL   Albumin 4.6 3.6 - 4.8 g/dL   Globulin, Total 3.1 1.5 - 4.5 g/dL   Albumin/Globulin Ratio 1.5 1.2 - 2.2   Bilirubin Total 0.5 0.0 - 1.2 mg/dL   Alkaline Phosphatase 142 (H) 39 -  117 IU/L   AST 61 (H) 0 - 40 IU/L   ALT 102 (H) 0 - 32 IU/L   Narrative   Performed at:  [facility] [address], [place], [place]  [id number] Lab Director: Rush Farmer MD, Phone:  [phone]  Lipid panel     Status: Abnormal   Collection Time: 06/08/17 12:30 PM   Result Value Ref Range   Cholesterol, Total 212 (H) 100 - 199 mg/dL   Triglycerides 184 (H) 0 - 149 mg/dL   HDL 73 >39 mg/dL   VLDL Cholesterol Cal 37 5 - 40 mg/dL   LDL Calculated 102 (H) 0 - 99 mg/dL   Chol/HDL Ratio 2.9 0.0 - 4.4 ratio   Narrative   Performed at:  [facility] [address], [place], [place]  [id number] Lab Director: Rush Farmer MD, Phone:  [phone]  TSH     Status: None (Preliminary result)   Collection Time: 06/08/17 12:30 PM  Result Value Ref Range   TSH WILL FOLLOW    Narrative   Performed at:  [facility] [address] [place], [place]  [id number] Lab Director: Rush Farmer MD, Phone:  [phone]  Hemoglobin A1c     Status: Abnormal   Collection Time: 06/08/17 12:30 PM  Result Value Ref Range   Hgb A1c MFr Bld 6.0 (H) 4.8 - 5.6 %   Est. average glucose Bld gHb Est-mCnc 126 mg/dL   Narrative   Performed at:  [facility] [address], [place], [place]  [id number] Lab Director: Rush Farmer MD, Phone:  [phone]  POCT urinalysis dipstick     Status: Abnormal   Collection Time: 06/08/17 12:39 PM  Result Value Ref Range   Color, UA orange (A) yellow   Clarity, UA clear clear   Glucose, UA =100 (A) negative mg/dL   Bilirubin, UA small (A) negative   Ketones, POC UA trace (5) (A) negative mg/dL   Spec Grav, UA 1.020 1.010 - 1.025   Blood, UA negative negative   pH, UA 5.0 5.0 - 8.0   Protein Ur, POC =30 (A) negative mg/dL   Urobilinogen, UA 1.0 0.2 or 1.0 E.U./dL   Nitrite, UA Positive (A) Negative   Leukocytes, UA Trace (A) Negative    No results found.   ASSESSMENT and PLAN 1. Encounter for medical examination to establish care PMH, PSH, meds, allergies, Fhx, Shx, reviewed with patient today. - CBC with Differential - Comprehensive metabolic panel - Lipid panel - TSH  2. Dysuria Patient with positive urine culture on 10/29 that should have responded to augmentin. However urine today +  nitrates. Treating again with ciprofloxacin. She denies h/o kidney stones. She has been referred to urology, pending appointment. Glucose has been present in her most recent urine dipsticks, r/o DM2 as contributing factor. RTC precautions given. - POCT urinalysis dipstick - POCT Wet + KOH Prep - Urine Culture  3. Essential hypertension BP at goal, managed by cards, keep scheduled appointments. - CBC with Differential - Comprehensive metabolic panel - Lipid panel - TSH  4. Irritable bowel syndrome with constipation Well controlled on current regime.  5. Glucosuria See above - Hemoglobin A1c  Other orders - ciprofloxacin (CIPRO) 500 MG tablet; Take 1 tablet (500 mg total) 2 (two) times daily by mouth.  Return in about 1 week (around 06/15/2017).    Rutherford Guys, MD Primary Care at [facility] [place], [place] [address] Ph.  [phone] Fax [phone]

## 2017-06-09 LAB — COMPREHENSIVE METABOLIC PANEL
ALT: 102 IU/L — ABNORMAL HIGH (ref 0–32)
AST: 61 IU/L — ABNORMAL HIGH (ref 0–40)
Albumin/Globulin Ratio: 1.5 (ref 1.2–2.2)
Albumin: 4.6 g/dL (ref 3.6–4.8)
Alkaline Phosphatase: 142 IU/L — ABNORMAL HIGH (ref 39–117)
BUN/Creatinine Ratio: 33 — ABNORMAL HIGH (ref 12–28)
BUN: 20 mg/dL (ref 8–27)
Bilirubin Total: 0.5 mg/dL (ref 0.0–1.2)
CO2: 23 mmol/L (ref 20–29)
Calcium: 9.9 mg/dL (ref 8.7–10.3)
Chloride: 100 mmol/L (ref 96–106)
Creatinine, Ser: 0.6 mg/dL (ref 0.57–1.00)
GFR calc Af Amer: 108 mL/min/{1.73_m2} (ref >59)
GFR calc non Af Amer: 93 mL/min/{1.73_m2} (ref >59)
Globulin, Total: 3.1 g/dL (ref 1.5–4.5)
Glucose: 86 mg/dL (ref 65–99)
Potassium: 4.8 mmol/L (ref 3.5–5.2)
Sodium: 140 mmol/L (ref 134–144)
Total Protein: 7.7 g/dL (ref 6.0–8.5)

## 2017-06-09 LAB — CBC WITH DIFFERENTIAL/PLATELET
Basophils Absolute: 0 10*3/uL (ref 0.0–0.2)
Basos: 0 %
EOS (ABSOLUTE): 0.1 10*3/uL (ref 0.0–0.4)
Eos: 2 %
Hematocrit: 36.5 % (ref 34.0–46.6)
Hemoglobin: 12.2 g/dL (ref 11.1–15.9)
Immature Grans (Abs): 0 10*3/uL (ref 0.0–0.1)
Immature Granulocytes: 0 %
Lymphocytes Absolute: 3.8 10*3/uL — ABNORMAL HIGH (ref 0.7–3.1)
Lymphs: 51 %
MCH: 32.5 pg (ref 26.6–33.0)
MCHC: 33.4 g/dL (ref 31.5–35.7)
MCV: 97 fL (ref 79–97)
Monocytes Absolute: 0.5 10*3/uL (ref 0.1–0.9)
Monocytes: 6 %
Neutrophils Absolute: 3 10*3/uL (ref 1.4–7.0)
Neutrophils: 41 %
Platelets: 232 10*3/uL (ref 150–379)
RBC: 3.75 x10E6/uL — ABNORMAL LOW (ref 3.77–5.28)
RDW: 14.5 % (ref 12.3–15.4)
WBC: 7.4 10*3/uL (ref 3.4–10.8)

## 2017-06-09 LAB — LIPID PANEL
Chol/HDL Ratio: 2.9 ratio (ref 0.0–4.4)
Cholesterol, Total: 212 mg/dL — ABNORMAL HIGH (ref 100–199)
HDL: 73 mg/dL (ref >39)
LDL Calculated: 102 mg/dL — ABNORMAL HIGH (ref 0–99)
Triglycerides: 184 mg/dL — ABNORMAL HIGH (ref 0–149)
VLDL Cholesterol Cal: 37 mg/dL (ref 5–40)

## 2017-06-09 LAB — TSH: TSH: 1.25 u[IU]/mL (ref 0.450–4.500)

## 2017-06-09 LAB — HEMOGLOBIN A1C
Est. average glucose Bld gHb Est-mCnc: 126 mg/dL
Hgb A1c MFr Bld: 6 % — ABNORMAL HIGH (ref 4.8–5.6)

## 2017-06-10 ENCOUNTER — Ambulatory Visit: Payer: Medicare Other | Admitting: Family Medicine

## 2017-06-10 LAB — URINE CULTURE

## 2017-06-19 ENCOUNTER — Ambulatory Visit (INDEPENDENT_AMBULATORY_CARE_PROVIDER_SITE_OTHER): Payer: Medicare Other | Admitting: Family Medicine

## 2017-06-19 ENCOUNTER — Encounter: Payer: Self-pay | Admitting: Family Medicine

## 2017-06-19 ENCOUNTER — Other Ambulatory Visit: Payer: Self-pay

## 2017-06-19 VITALS — BP 105/54 | HR 89 | Temp 97.8°F | Resp 18 | Ht 63.0 in | Wt 172.0 lb

## 2017-06-19 DIAGNOSIS — R7989 Other specified abnormal findings of blood chemistry: Secondary | ICD-10-CM

## 2017-06-19 DIAGNOSIS — R7303 Prediabetes: Secondary | ICD-10-CM | POA: Diagnosis not present

## 2017-06-19 DIAGNOSIS — R300 Dysuria: Secondary | ICD-10-CM | POA: Diagnosis not present

## 2017-06-19 DIAGNOSIS — R945 Abnormal results of liver function studies: Secondary | ICD-10-CM

## 2017-06-19 HISTORY — DX: Other specified abnormal findings of blood chemistry: R79.89

## 2017-06-19 HISTORY — DX: Abnormal results of liver function studies: R94.5

## 2017-06-19 LAB — POCT URINALYSIS DIP (MANUAL ENTRY)
Bilirubin, UA: NEGATIVE
Blood, UA: NEGATIVE
Glucose, UA: NEGATIVE mg/dL
Ketones, POC UA: NEGATIVE mg/dL
Leukocytes, UA: NEGATIVE
Nitrite, UA: NEGATIVE
Protein Ur, POC: NEGATIVE mg/dL
Spec Grav, UA: 1.025 (ref 1.010–1.025)
Urobilinogen, UA: 0.2 E.U./dL
pH, UA: 6 (ref 5.0–8.0)

## 2017-06-19 NOTE — Progress Notes (Signed)
11/23/20181:54 PM  [name] 1946/05/09, 71 y.o. female [id number]  Chief Complaint  Patient presents with  . Urinary Tract Infection    follow up     HPI:   Patient is a 72 y.o. female with past medical history significant for ischemic cardiomyopathy w last EF 25-30% and COPD who presents today for follow-up.  Her most significant concern of recent has been recurring UTI and continued pain with urination in setting of unremarkable UA/cultures. She states that the only thing that helps is pyridium. She has not been taking it for the past several days. She reports that past trials of vaginal estrogen have not helped. Has not heard from urology yet.   Depression screen [facility] 2/9 06/19/2017 06/08/2017 06/02/2017  Decreased Interest 0 0 0  Down, Depressed, Hopeless 0 0 0  PHQ - 2 Score 0 0 0  Some recent data might be hidden    Allergies  Allergen Reactions  . Lisinopril     cough  . Sulfa Antibiotics Other (See Comments)    Unknown, childhood allergy   . Sulfonamide Derivatives Other (See Comments)    UNSURE    Prior to Admission medications   Medication Sig Start Date End Date Taking? Authorizing Provider  albuterol (PROAIR HFA) 108 (90 BASE) MCG/ACT inhaler Inhale 2 puffs into the lungs every 6 (six) hours as needed. For shortness of breath   Yes [provider]  amitriptyline (ELAVIL) 50 MG tablet Take 50 mg by mouth at bedtime. 01/10/13  Yes Inda Castle, MD  aspirin 81 MG tablet Take 81 mg by mouth every morning.    Yes [provider]  bisoprolol (ZEBETA) 5 MG tablet Take 1 tablet (5 mg total) by mouth daily. 09/25/16  Yes [name], MD  ciprofloxacin (CIPRO) 500 MG tablet Take 1 tablet (500 mg total) 2 (two) times daily by mouth. 06/08/17  Yes Rutherford Guys, MD  DEXILANT 60 MG capsule TAKE 1 CAPSULE BY MOUTH DAILY. 06/02/17  Yes Armbruster, Carlota Raspberry, MD  dicyclomine (BENTYL) 10 MG capsule Take 1 capsule (10 mg total) by mouth every 8  (eight) hours as needed for spasms. 05/20/17  Yes Armbruster, Carlota Raspberry, MD  Fluticasone-Salmeterol (ADVAIR) 250-50 MCG/DOSE AEPB Inhale 1 puff into the lungs 2 (two) times daily. 12/08/14  Yes [provider]  furosemide (LASIX) 20 MG tablet TAKE 1 TABLET BY MOUTH DAILY. 12/25/16  Yes Allred, Jeneen Rinks, MD  polyethylene glycol powder (GLYCOLAX/MIRALAX) powder Take 1 Container by mouth daily as needed for mild constipation.  05/30/14  Yes [provider]  ranitidine (ZANTAC) 150 MG tablet TAKE 1 TABLET BY MOUTH 2 TIMES DAILY. 05/13/17  Yes Armbruster, Carlota Raspberry, MD  rosuvastatin (CRESTOR) 20 MG tablet TAKE 1 TABLET BY MOUTH AT BEDTIME. 06/10/16  Yes [name], MD  sacubitril-valsartan (ENTRESTO) 24-26 MG Take 1 tablet by mouth 2 (two) times daily. 09/24/16  Yes Allred, Jeneen Rinks, MD  topiramate (TOPAMAX) 50 MG tablet Take 1 tablet (50 mg total) by mouth daily. 08/26/16  Yes Dennie Bible, NP  phenazopyridine (PYRIDIUM) 200 MG tablet Take 1 tablet (200 mg total) 3 (three) times daily as needed by mouth for pain. Patient not taking: Reported on 06/19/2017 06/02/17   McVey, Gelene Mink, PA-C    Past Medical History:  Diagnosis Date  . AICD (automatic cardioverter/defibrillator) present    Dr.Allred follows  . Aortic arch pseudoaneurysm (HCC)    a. followed by Dr. Prescott Gum.  . Asthma   .  Benign neoplasm of colon   . CAD (coronary artery disease) 02/2009   a. anterior STEMI rx with BMS to prox LAD in 02/2009. b. ISR s/p PTCA 06/2009. c. ISR s/p thrombectomy & PTCA 03/2010 due to late stent thrombosis.   . Chronic renal insufficiency   . Chronic systolic CHF (congestive heart failure) ([place])    a. s/p ST. Jude ICD 2013.  . Colon polyp   . COPD (chronic obstructive pulmonary disease) ([place])   . Diverticulosis   . GI bleed    a. h/o GIB on DAPT, now on ASA only.  Marland Kitchen Headache(784.0)    migraine  . Hearing loss    left ear  . Hyperlipemia   . Hypertension   . Irritable  bowel syndrome   . Ischemic cardiomyopathy    EF 10-15%  . Memory loss   . Myocardial infarct [facility]) 2011 x 2   Dr. Roswell Nickel   . Pulmonary embolism ([place]) 2011  . Transfusion history    ?'12 or '13  . Unspecified mastoiditis     Past Surgical History:  Procedure Laterality Date  . ANGIOPLASTY  07/02/09, 04/01/10  . BILATERAL SALPINGOOPHORECTOMY    . CAD( bare metal stent)  02/2009   x 1  . CERVICAL SPINE SURGERY  08/08  . COLONOSCOPY WITH PROPOFOL N/A 04/29/2016   Procedure: COLONOSCOPY WITH PROPOFOL;  Surgeon: [name], MD;  Location: [facility] ENDOSCOPY;  Service: Gastroenterology;  Laterality: N/A;  . ESOPHAGOGASTRODUODENOSCOPY N/A 12/09/2016   Procedure: ESOPHAGOGASTRODUODENOSCOPY (EGD);  Surgeon: [name], MD;  Location: [facility];  Service: Gastroenterology;  Laterality: N/A;  . ESOPHAGOGASTRODUODENOSCOPY (EGD) WITH PROPOFOL N/A 04/29/2016   Procedure: ESOPHAGOGASTRODUODENOSCOPY (EGD) WITH PROPOFOL;  Surgeon: [name], MD;  Location: [facility];  Service: Gastroenterology;  Laterality: N/A;  . EUS N/A 05/15/2016   Procedure: UPPER ENDOSCOPIC ULTRASOUND (EUS) RADIAL;  Surgeon: Milus Banister, MD;  Location: [facility] ENDOSCOPY;  Service: Endoscopy;  Laterality: N/A;  . IMPLANTABLE CARDIOVERTER DEFIBRILLATOR IMPLANT N/A 08/05/2011   Primary prevention SJM ICD implanted,  Analyze ST study patient  . INNER EAR SURGERY     left x 17  . LUMBAR DISC SURGERY  02/2008   fusion  . TOTAL ABDOMINAL HYSTERECTOMY     complete    Social History   Tobacco Use  . Smoking status: Former Smoker    Packs/day: 0.25    Types: Cigarettes    Last attempt to quit: 04/27/2009    Years since quitting: 8.1  . Smokeless tobacco: Never Used  Substance Use Topics  . Alcohol use: No    Alcohol/week: 0.0 oz    Family History  Problem Relation Age of Onset  . Colon cancer Mother   . Colon cancer Brother   . Bladder Cancer Brother   . Breast cancer Cousin       ROS Per HPI  OBJECTIVE:  Blood pressure (!) 105/54, pulse 89, temperature 97.8 F (36.6 C), temperature source Oral, resp. rate 18, height 5\' 3"  (1.6 m), weight 172 lb (78 kg), SpO2 96 %.  Physical Exam  Results for orders placed or performed in visit on 06/19/17 (from the past 24 hour(s))  POCT urinalysis dipstick     Status: Abnormal   Collection Time: 06/19/17  2:05 PM  Result Value Ref Range   Color, UA yellow yellow   Clarity, UA cloudy (A) clear   Glucose, UA negative negative mg/dL   Bilirubin, UA negative negative   Ketones, POC UA negative negative  mg/dL   Spec Grav, UA 1.025 1.010 - 1.025   Blood, UA negative negative   pH, UA 6.0 5.0 - 8.0   Protein Ur, POC negative negative mg/dL   Urobilinogen, UA 0.2 0.2 or 1.0 E.U./dL   Nitrite, UA Negative Negative   Leukocytes, UA Negative Negative      ASSESSMENT and PLAN  1. Dysuria Unclear etiology. Normal UA today. Discussed concerns for continued/prolonged use of pyridium. Pending urological evaluation. Past treatment with vaginal estrogens have not been beneficial.  - POCT urinalysis dipstick  2. Pre-diabetes Discussed diagnosis. Importance of weight loss. Unable to exercise to due DOE 2/2 CHF. Patient educational handouts for 1200 calorie block diet given.   3. Abnormal LFTs New abnormality, previous LFTs have been normal. CT abd/pelvis in 2016 showed normal liver. Might be related to recent weight gain. Discussed avoidance of liver toxic meds. Discussed weight loss. Recheck in 3 months. RTC precautions given.   Return in about 3 months (around 09/19/2017).    Rutherford Guys, MD Primary Care at [facility] [place], [place] [address] Ph.  [phone] Fax [phone]

## 2017-06-19 NOTE — Patient Instructions (Addendum)
Prediabetes Eating Plan Prediabetes-also called impaired glucose tolerance or impaired fasting glucose-is a condition that causes blood sugar (blood glucose) levels to be higher than normal. Following a healthy diet can help to keep prediabetes under control. It can also help to lower the risk of type 2 diabetes and heart disease, which are increased in people who have prediabetes. Along with regular exercise, a healthy diet:  Promotes weight loss.  Helps to control blood sugar levels.  Helps to improve the way that the body uses insulin.  What do I need to know about this eating plan?  Use the glycemic index (GI) to plan your meals. The index tells you how quickly a food will raise your blood sugar. Choose low-GI foods. These foods take a longer time to raise blood sugar.  Pay close attention to the amount of carbohydrates in the food that you eat. Carbohydrates increase blood sugar levels.  Keep track of how many calories you take in. Eating the right amount of calories will help you to achieve a healthy weight. Losing about 7 percent of your starting weight can help to prevent type 2 diabetes.  You may want to follow a Mediterranean diet. This diet includes a lot of vegetables, lean meats or fish, whole grains, fruits, and healthy oils and fats. What foods can I eat? Grains Whole grains, such as whole-wheat or whole-grain breads, crackers, cereals, and pasta. Unsweetened oatmeal. Bulgur. Barley. Quinoa. Brown rice. Corn or whole-wheat flour tortillas or taco shells. Vegetables Lettuce. Spinach. Peas. Beets. Cauliflower. Cabbage. Broccoli. Carrots. Tomatoes. Squash. Eggplant. Herbs. Peppers. Onions. Cucumbers. Brussels sprouts. Fruits Berries. Bananas. Apples. Oranges. Grapes. Papaya. Mango. Pomegranate. Kiwi. Grapefruit. Cherries. Meats and Other Protein Sources Seafood. Lean meats, such as chicken and [place] or lean cuts of pork and beef. Tofu. Eggs. Nuts. Beans. Dairy Low-fat or  fat-free dairy products, such as yogurt, cottage cheese, and cheese. Beverages Water. Tea. Coffee. Sugar-free or diet soda. Seltzer water. Milk. Milk alternatives, such as soy or almond milk. Condiments Mustard. Relish. Low-fat, low-sugar ketchup. Low-fat, low-sugar barbecue sauce. Low-fat or fat-free mayonnaise. Sweets and Desserts Sugar-free or low-fat pudding. Sugar-free or low-fat ice cream and other frozen treats. Fats and Oils Avocado. Walnuts. Olive oil. The items listed above may not be a complete list of recommended foods or beverages. Contact your dietitian for more options. What foods are not recommended? Grains Refined white flour and flour products, such as bread, pasta, snack foods, and cereals. Beverages Sweetened drinks, such as sweet iced tea and soda. Sweets and Desserts Baked goods, such as cake, cupcakes, pastries, cookies, and cheesecake. The items listed above may not be a complete list of foods and beverages to avoid. Contact your dietitian for more information. This information is not intended to replace advice given to you by your health care provider. Make sure you discuss any questions you have with your health care provider. Document Released: 11/28/2014 Document Revised: 12/20/2015 Document Reviewed: 08/09/2014 Elsevier Interactive Patient Education  2017 [employer].     IF you received an x-ray today, you will receive an invoice from [facility]. Please contact [facility] at [phone] with questions or concerns regarding your invoice.   IF you received labwork today, you will receive an invoice from [facility]. Please contact [facility] at [phone] with questions or concerns regarding your invoice.   Our billing staff will not be able to assist you with questions regarding bills from these companies.  You will be contacted with the lab results as soon as  they are available. The fastest way to get your results is to activate your My  Chart account. Instructions are located on the last page of this paperwork. If you have not heard from [place] regarding the results in 2 weeks, please contact this office.

## 2017-06-23 ENCOUNTER — Ambulatory Visit: Payer: Medicare Other | Admitting: Family Medicine

## 2017-06-29 ENCOUNTER — Other Ambulatory Visit: Payer: Self-pay | Admitting: Cardiovascular Disease

## 2017-07-03 ENCOUNTER — Telehealth: Payer: Self-pay | Admitting: Family Medicine

## 2017-07-03 NOTE — Telephone Encounter (Signed)
Copied from [place] [id number]. Topic: Quick Communication - See Telephone Encounter >> Jul 03, 2017  1:49 PM Hewitt Shorts wrote: CRM for notification. See Telephone encounter for: pt is needing a rx called in for polyethleneglycol laxative to be called in to the [place] [facility] [phone] pt states that she has been taking this for ever and that the pharmacy she usually gets this from cant get it any longer   Best number [phone]

## 2017-07-06 NOTE — Telephone Encounter (Signed)
Refill  Request  From  Unknown  Provider

## 2017-07-08 ENCOUNTER — Telehealth: Payer: Self-pay

## 2017-07-08 MED ORDER — POLYETHYLENE GLYCOL 3350 17 GM/SCOOP PO POWD: 17.0000 g | Freq: Every day | ORAL | 11 refills | Status: AC | PRN

## 2017-07-08 NOTE — Telephone Encounter (Signed)
Copied from [place] [id number]. Topic: Quick Communication - See Telephone Encounter >> Jul 03, 2017  1:49 PM Hewitt Shorts wrote: CRM for notification. See Telephone encounter for: pt is needing a rx called in for polyethleneglycol laxative to be called in to the [place] [facility] [phone] pt states that she has been taking this for ever and that the pharmacy she usually gets this from cant get it any longer   Best number [phone]

## 2017-07-08 NOTE — Addendum Note (Signed)
Addended by: Rutherford Guys on: 07/08/2017 03:33 PM   Modules accepted: Orders

## 2017-07-08 NOTE — Telephone Encounter (Signed)
rx sent, please notify patient. thanks

## 2017-07-09 NOTE — Telephone Encounter (Signed)
Phone call to patient. Patient notified polyethylene glycol powder has been sent to [place]. Patient agreeable, she has no further questions.

## 2017-07-09 NOTE — Telephone Encounter (Signed)
See 07/09/17 documentation.

## 2017-07-13 ENCOUNTER — Encounter: Payer: Medicare Other | Admitting: *Deleted

## 2017-07-13 VITALS — BP 127/80 | HR 77 | Wt 170.2 lb

## 2017-07-13 DIAGNOSIS — Z006 Encounter for examination for normal comparison and control in clinical research program: Secondary | ICD-10-CM

## 2017-07-13 NOTE — Progress Notes (Signed)
RESEARCH ENCOUNTER  Patient ID: [name]  DOB: [birth year]  [name] presented to the [facility] for the Month 9 visit of the BeAT HF- Research Study.  No questions or concerns related to study.  Denies any heart failure exacerbation.  Stated that she has been having issues with a recurrent UTI and is scheduled to see a Dealer on Dec 24.    Patient will follow up with [facility] in February.

## 2017-08-03 NOTE — Progress Notes (Signed)
[facility]  PATIENT: [name] DOB: [birth year]   REASON FOR VISIT: Follow-up for migraine HISTORY FROM: Patient    HISTORY OF PRESENT ILLNESS: [name] is a 72 year old right-handed white divorced female with a history of headaches and essential tremor. Patient of Dr. Erling Cruz,  She has history of multiple operations on the left mastoid between the ages of 89 and 89. She is currently on disability for that reason. She has CAD, PE, she has congestive heart failure, COPD, irritable bowel syndrome, left ankle fracture, hyperlipidemia, acute renal failure with stage III chronic renal disease, metabolic acidosis from Topamax, pulmonary embolism 03/2010, and a defibrillator was placed 08/2011.  She has history of migraine since age 67s, she complains of vertex area pressure headache, she has 2-6 headaches each month, She is taking amitriptyline 50mg  qhs, which has been helpful. She took overcounter medication for nause, as needed, her headache can last all day long, ASA make her stomach hurt,  She brought her headache diarrhea, she had 4-6 headache each month,  UPDATE 08/26/16 CM. [name], 72 year old female returns for follow-up for headaches. She reports 1 bad migraine in the last year and an additional 4 headaches since last seen. She is currently on Topamax 50 mg daily. She does not take an acute medication. She is not aware of any foods that are triggers. She will occasionally have nausea and vomiting with her headache.She returns for reevaluation. UPDATE 1/8/2019CM [name], 72 year old female returns for follow-up with a history of headaches.  She reports for bad headaches in the last year.  She is currently on Topamax 50 mg daily and has tried to taper the medication but her headaches return.  She is not on any acute medication she is not aware of any specific migraine triggers.  She occasionally will have nausea and vomiting with her headaches.  She does not wish  to titrate her Topamax at this time.  She returns for reevaluation REVIEW OF SYSTEMS: Full 14 system review of systems performed and notable only for those listed, all others are neg:  Constitutional: neg  Cardiovascular: neg Ear/Nose/Throat: hearing loss Skin: neg Eyes: neg Respiratory: neg Gastroitestinal: constipation Hematology/Lymphatic: neg  Endocrine: neg Musculoskeletal:walking difficulty Allergy/Immunology: neg Neurological: headache dizziness Psychiatric: neg Sleep : neg   ALLERGIES: Allergies  Allergen Reactions  . Lisinopril     cough  . Sulfa Antibiotics Other (See Comments)    Unknown, childhood allergy   . Sulfonamide Derivatives Other (See Comments)    UNSURE    HOME MEDICATIONS: Outpatient Medications Prior to Visit  Medication Sig Dispense Refill  . albuterol (PROAIR HFA) 108 (90 BASE) MCG/ACT inhaler Inhale 2 puffs into the lungs every 6 (six) hours as needed. For shortness of breath    . amitriptyline (ELAVIL) 50 MG tablet Take 50 mg by mouth at bedtime.    Marland Kitchen aspirin 81 MG tablet Take 81 mg by mouth every morning.     . bisoprolol (ZEBETA) 5 MG tablet Take 1 tablet (5 mg total) by mouth daily. 30 tablet 11  . DEXILANT 60 MG capsule TAKE 1 CAPSULE BY MOUTH DAILY. 30 capsule 3  . dicyclomine (BENTYL) 10 MG capsule Take 1 capsule (10 mg total) by mouth every 8 (eight) hours as needed for spasms. 30 capsule 3  . Fluticasone-Salmeterol (ADVAIR) 250-50 MCG/DOSE AEPB Inhale 1 puff into the lungs 2 (two) times daily.    . furosemide (LASIX) 20 MG tablet TAKE 1 TABLET BY MOUTH DAILY. 30 tablet 10  .  phenazopyridine (PYRIDIUM) 200 MG tablet Take 1 tablet (200 mg total) 3 (three) times daily as needed by mouth for pain. 10 tablet 0  . polyethylene glycol powder (GLYCOLAX/MIRALAX) powder Take 17 g by mouth daily as needed for mild constipation. 255 g 11  . ranitidine (ZANTAC) 150 MG tablet TAKE 1 TABLET BY MOUTH 2 TIMES DAILY. 180 tablet 0  . rosuvastatin (CRESTOR)  20 MG tablet TAKE 1 TABLET BY MOUTH AT BEDTIME. 90 tablet 3  . sacubitril-valsartan (ENTRESTO) 24-26 MG Take 1 tablet by mouth 2 (two) times daily. 180 tablet 2  . topiramate (TOPAMAX) 50 MG tablet Take 1 tablet (50 mg total) by mouth daily. 90 tablet 3  . ciprofloxacin (CIPRO) 500 MG tablet Take 1 tablet (500 mg total) 2 (two) times daily by mouth. 10 tablet 0   No facility-administered medications prior to visit.     PAST MEDICAL HISTORY: Past Medical History:  Diagnosis Date  . AICD (automatic cardioverter/defibrillator) present    Dr.Allred follows  . Aortic arch pseudoaneurysm (HCC)    a. followed by Dr. Prescott Gum.  . Asthma   . Benign neoplasm of colon   . CAD (coronary artery disease) 02/2009   a. anterior STEMI rx with BMS to prox LAD in 02/2009. b. ISR s/p PTCA 06/2009. c. ISR s/p thrombectomy & PTCA 03/2010 due to late stent thrombosis.   . Chronic renal insufficiency   . Chronic systolic CHF (congestive heart failure) ([place])    a. s/p ST. Jude ICD 2013.  . Colon polyp   . COPD (chronic obstructive pulmonary disease) ([place])   . Diverticulosis   . GI bleed    a. h/o GIB on DAPT, now on ASA only.  Marland Kitchen Headache(784.0)    migraine  . Hearing loss    left ear  . Hyperlipemia   . Hypertension   . Irritable bowel syndrome   . Ischemic cardiomyopathy    EF 10-15%  . Memory loss   . Myocardial infarct [facility]) 2011 x 2   Dr. Roswell Nickel   . Pulmonary embolism ([place]) 2011  . Transfusion history    ?'12 or '13  . Unspecified mastoiditis     PAST SURGICAL HISTORY: Past Surgical History:  Procedure Laterality Date  . ANGIOPLASTY  07/02/09, 04/01/10  . BILATERAL SALPINGOOPHORECTOMY    . CAD( bare metal stent)  02/2009   x 1  . CERVICAL SPINE SURGERY  08/08  . COLONOSCOPY WITH PROPOFOL N/A 04/29/2016   Procedure: COLONOSCOPY WITH PROPOFOL;  Surgeon: [name], MD;  Location: [facility];  Service: Gastroenterology;  Laterality: N/A;  . ESOPHAGOGASTRODUODENOSCOPY  N/A 12/09/2016   Procedure: ESOPHAGOGASTRODUODENOSCOPY (EGD);  Surgeon: [name], MD;  Location: [facility];  Service: Gastroenterology;  Laterality: N/A;  . ESOPHAGOGASTRODUODENOSCOPY (EGD) WITH PROPOFOL N/A 04/29/2016   Procedure: ESOPHAGOGASTRODUODENOSCOPY (EGD) WITH PROPOFOL;  Surgeon: [name], MD;  [facility]: [facility];  Service: Gastroenterology;  Laterality: N/A;  . EUS N/A 05/15/2016   Procedure: UPPER ENDOSCOPIC ULTRASOUND (EUS) RADIAL;  Surgeon: Milus Banister, MD;  Location: [facility];  Service: Endoscopy;  Laterality: N/A;  . IMPLANTABLE CARDIOVERTER DEFIBRILLATOR IMPLANT N/A 08/05/2011   Primary prevention SJM ICD implanted,  Analyze ST study patient  . INNER EAR SURGERY     left x 17  . LUMBAR DISC SURGERY  02/2008   fusion  . TOTAL ABDOMINAL HYSTERECTOMY     complete    FAMILY HISTORY: Family History  Problem Relation Age of Onset  . Colon  cancer Mother   . Colon cancer Brother   . Bladder Cancer Brother   . Breast cancer Cousin   . Cancer Son     SOCIAL HISTORY: Social History   Socioeconomic History  . Marital status: Divorced    Spouse name: Not on file  . Number of children: 2  . Years of education: 72  . Highest education level: Not on file  Social Needs  . Financial resource strain: Not on file  . Food insecurity - worry: Not on file  . Food insecurity - inability: Not on file  . Transportation needs - medical: Not on file  . Transportation needs - non-medical: Not on file  Occupational History  . Occupation: Merchandiser, retail: UNEMPLOYED    Employer: DISABLED  Tobacco Use  . Smoking status: Former Smoker    Packs/day: 0.25    Types: Cigarettes    Last attempt to quit: 04/27/2009    Years since quitting: 8.2  . Smokeless tobacco: Never Used  Substance and Sexual Activity  . Alcohol use: No    Alcohol/week: 0.0 oz  . Drug use: No  . Sexual activity: Not on file  Other Topics Concern  . Not on file    Social History Narrative   Pt lives in [place] alone.  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   Patient has 11 th grade education.Right handed.   Caffeine- one cup daily           PHYSICAL EXAM  Vitals:   08/04/17 1310  BP: 116/73  Pulse: 82  Weight: 170 lb 6.4 oz (77.3 kg)   Body mass index is 30.19 kg/m.  Generalized: Well developed,obese female  in no acute distress  Head: normocephalic and atraumatic,. Oropharynx benign  Neck: Supple,  Musculoskeletal: No deformity   Neurological examination   Mentation: Alert oriented to time, place, history taking. Attention span and concentration appropriate. Recent and remote memory intact.  Follows all commands speech and language fluent.   Cranial nerve II-XII: Pupils were equal round reactive to light extraocular movements were full, visual field were full on confrontational test. Facial sensation and strength were normal. Hard of hearing, left greater than right. Uvula tongue midline. head turning and shoulder shrug were normal and symmetric.Tongue protrusion into cheek strength was normal. Motor: normal bulk and tone, full strength in the BUE, BLE, fine finger movements normal, no pronator drift.  Coordination: finger-nose-finger, heel-to-shin bilaterally, no dysmetria Reflexes: symmetric upper and lower plantar responses were flexor bilaterally. Gait and Station: Rising up from seated position without assistance, normal stance,  moderate stride, good arm swing, smooth turning, able to perform tiptoe, and heel walking without difficulty. Tandem gait is unsteady. No assistive device  DIAGNOSTIC DATA (LABS, IMAGING, TESTING) - I reviewed patient records, labs, notes, testing and imaging myself where available.  Lab Results  Component Value Date   WBC 7.4 06/08/2017   HGB 12.2 06/08/2017   HCT 36.5 06/08/2017   MCV 97 06/08/2017   PLT 232 06/08/2017      Component Value Date/Time   NA 140 06/08/2017 1230   K  4.8 06/08/2017 1230   CL 100 06/08/2017 1230   CO2 23 06/08/2017 1230   GLUCOSE 86 06/08/2017 1230   GLUCOSE 95 01/15/2016 1345   BUN 20 06/08/2017 1230   CREATININE 0.60 06/08/2017 1230   CREATININE 1.56 (H) 01/15/2016 1345   CALCIUM 9.9 06/08/2017 1230   PROT 7.7 06/08/2017 1230   ALBUMIN 4.6 06/08/2017  1230   AST 61 (H) 06/08/2017 1230   ALT 102 (H) 06/08/2017 1230   ALKPHOS 142 (H) 06/08/2017 1230   BILITOT 0.5 06/08/2017 1230   GFRNONAA 93 06/08/2017 1230   GFRAA 108 06/08/2017 1230     Lab Results  Component Value Date   TSH 1.250 06/08/2017      ASSESSMENT AND PLAN  72 y.o. year old female  has a past medical history migraine headaches. Also past medical history of hyperlipidemia, coronary artery disease status post pacemaker insertion.She has had 4 migraines in the last year.  She has tried to taper her Topamax but her headaches return.  She is not interested in tapering at this point   PLAN: Continue Topamax 50mg  daily will refill for one year Call for increase in headaches  follow-up yearly and when necessary Dennie Bible, [facility], [facility], [facility] [address] [place], [place] [address] [phone]

## 2017-08-04 ENCOUNTER — Encounter (INDEPENDENT_AMBULATORY_CARE_PROVIDER_SITE_OTHER): Payer: Self-pay

## 2017-08-04 ENCOUNTER — Ambulatory Visit (INDEPENDENT_AMBULATORY_CARE_PROVIDER_SITE_OTHER): Payer: Medicare Other | Admitting: Nurse Practitioner

## 2017-08-04 ENCOUNTER — Encounter: Payer: Self-pay | Admitting: Nurse Practitioner

## 2017-08-04 VITALS — BP 116/73 | HR 82 | Wt 170.4 lb

## 2017-08-04 DIAGNOSIS — R519 Headache, unspecified: Secondary | ICD-10-CM

## 2017-08-04 DIAGNOSIS — R51 Headache: Secondary | ICD-10-CM

## 2017-08-04 MED ORDER — TOPIRAMATE 50 MG PO TABS: 50.0000 mg | ORAL_TABLET | Freq: Every day | ORAL | 3 refills | Status: DC

## 2017-08-04 NOTE — Patient Instructions (Signed)
Continue Topamax 50mg  daily will refill for one year Call for increase in headaches  follow-up yearly and when necessary

## 2017-08-05 NOTE — Progress Notes (Signed)
I have reviewed and agreed above plan. 

## 2017-08-17 ENCOUNTER — Ambulatory Visit (INDEPENDENT_AMBULATORY_CARE_PROVIDER_SITE_OTHER): Payer: Medicare Other | Admitting: *Deleted

## 2017-08-17 DIAGNOSIS — I255 Ischemic cardiomyopathy: Secondary | ICD-10-CM

## 2017-08-17 NOTE — Progress Notes (Signed)
Remote ICD transmission.   

## 2017-08-18 ENCOUNTER — Encounter: Payer: Self-pay | Admitting: Cardiology

## 2017-09-09 LAB — CUP PACEART REMOTE DEVICE CHECK
Battery Remaining Percentage: 46 %
HIGH POWER IMPEDANCE MEASURED VALUE: 81 Ohm
HighPow Impedance: 81 Ohm
Implantable Lead Implant Date: 20130108
Implantable Pulse Generator Implant Date: 20130108
Lead Channel Impedance Value: 530 Ohm
Lead Channel Pacing Threshold Pulse Width: 0.5 ms
Lead Channel Setting Pacing Amplitude: 2.5 V
Lead Channel Setting Pacing Pulse Width: 0.5 ms
Lead Channel Setting Sensing Sensitivity: 0.5 mV
MDC IDC LEAD LOCATION: 753860
MDC IDC MSMT BATTERY REMAINING LONGEVITY: 53 mo
MDC IDC MSMT BATTERY VOLTAGE: 2.93 V
MDC IDC MSMT LEADCHNL RV PACING THRESHOLD AMPLITUDE: 0.75 V
MDC IDC MSMT LEADCHNL RV SENSING INTR AMPL: 12 mV
MDC IDC SESS DTM: 20190121103619
MDC IDC STAT BRADY RV PERCENT PACED: 1 %
Pulse Gen Serial Number: 1016523

## 2017-09-17 ENCOUNTER — Encounter: Payer: Medicare Other | Admitting: *Deleted

## 2017-09-17 VITALS — BP 129/81 | HR 92 | Wt 171.2 lb

## 2017-09-17 DIAGNOSIS — Z006 Encounter for examination for normal comparison and control in clinical research program: Secondary | ICD-10-CM

## 2017-09-17 NOTE — Progress Notes (Signed)
RESEARCH ENCOUNTER  Patient ID: [name]  DOB: [birth year]  [name] presented to the [facility] for the Month 12 visit of the BeAT HF- Research Study.  No questions or concerns related to study.  Denies any heart failure exacerbation.  All 12 month activities completed without event.     Patient will follow up with [facility] in June.

## 2017-09-18 ENCOUNTER — Encounter: Payer: Self-pay | Admitting: Family Medicine

## 2017-09-18 ENCOUNTER — Ambulatory Visit (INDEPENDENT_AMBULATORY_CARE_PROVIDER_SITE_OTHER): Payer: Medicare Other | Admitting: Family Medicine

## 2017-09-18 ENCOUNTER — Other Ambulatory Visit: Payer: Self-pay

## 2017-09-18 VITALS — BP 114/80 | HR 77 | Temp 98.5°F | Resp 18 | Ht 63.0 in | Wt 171.6 lb

## 2017-09-18 DIAGNOSIS — R945 Abnormal results of liver function studies: Secondary | ICD-10-CM

## 2017-09-18 DIAGNOSIS — N952 Postmenopausal atrophic vaginitis: Secondary | ICD-10-CM | POA: Diagnosis not present

## 2017-09-18 DIAGNOSIS — R300 Dysuria: Secondary | ICD-10-CM

## 2017-09-18 DIAGNOSIS — R7989 Other specified abnormal findings of blood chemistry: Secondary | ICD-10-CM

## 2017-09-18 MED ORDER — ALBUTEROL SULFATE HFA 108 (90 BASE) MCG/ACT IN AERS: 2.0000 | INHALATION_SPRAY | Freq: Four times a day (QID) | RESPIRATORY_TRACT | 5 refills | Status: DC | PRN

## 2017-09-18 MED ORDER — ESTROGENS, CONJUGATED 0.625 MG/GM VA CREA: TOPICAL_CREAM | VAGINAL | 12 refills | Status: DC

## 2017-09-18 NOTE — Patient Instructions (Addendum)
We recommend that you schedule a mammogram for breast cancer screening. Typically, you do not need a referral to do this. Please contact a local imaging center to schedule your mammogram.  [facility] - [phone]  *ask for the Radiology Department [facility] - [phone] or [phone]  [facility] - [phone] [facility] - [phone] [facility] - [phone]  *ask for the Radiology Department [facility] - [phone]  *ask for the Radiology Department [facility] - [phone]  *ask for the Mammography Department [facility] - [phone]     IF you received an x-ray today, you will receive an invoice from [facility]. Please contact [facility] at [phone] with questions or concerns regarding your invoice.   IF you received labwork today, you will receive an invoice from [facility]. Please contact [facility] at [phone] with questions or concerns regarding your invoice.   Our billing staff will not be able to assist you with questions regarding bills from these companies.  You will be contacted with the lab results as soon as they are available. The fastest way to get your results is to activate your My Chart account. Instructions are located on the last page of this paperwork. If you have not heard from us regarding the results in 2 weeks, please contact this office.      

## 2017-09-18 NOTE — Progress Notes (Signed)
2/22/20191:52 PM  [name] [birth year], 72 y.o. female [id number]  Chief Complaint  Patient presents with  . Dysuria  . Follow-up  . Medication Refill    Proair inhaler    HPI:   Patient is a 72 y.o. female with past medical history significant for ischemic cardiomyopathy w last EF 25-30% and COPD who presents today for follow-up  Patient has seen cards and neurology, stable, no changes to regime, no workup ordered Patient saw urology, reports that dysuria is related to atrophic vaginitis, urology did not have any recommendations, would like to try low dose vaginal estrogen again. Pyridium not helping anymore. Otherwise requesting refill of albuterol inhaler, denies any current symptoms, has not need of recent, hers expired. Has no acute concerns today  Depression screen [facility] 2/9 09/18/2017 06/19/2017 06/08/2017  Decreased Interest 0 0 0  Down, Depressed, Hopeless 0 0 0  PHQ - 2 Score 0 0 0  Some recent data might be hidden    Allergies  Allergen Reactions  . Lisinopril     cough  . Sulfa Antibiotics Other (See Comments)    Unknown, childhood allergy   . Sulfonamide Derivatives Other (See Comments)    UNSURE    Prior to Admission medications   Medication Sig Start Date End Date Taking? Authorizing Provider  albuterol (PROAIR HFA) 108 (90 BASE) MCG/ACT inhaler Inhale 2 puffs into the lungs every 6 (six) hours as needed. For shortness of breath   Yes [provider]  amitriptyline (ELAVIL) 50 MG tablet Take 50 mg by mouth at bedtime. 01/10/13  Yes Inda Castle, MD  aspirin 81 MG tablet Take 81 mg by mouth every morning.    Yes [provider]  bisoprolol (ZEBETA) 5 MG tablet Take 1 tablet (5 mg total) by mouth daily. 09/25/16  Yes [name], MD  DEXILANT 60 MG capsule TAKE 1 CAPSULE BY MOUTH DAILY. 06/02/17  Yes Armbruster, Carlota Raspberry, MD  dicyclomine (BENTYL) 10 MG capsule Take 1 capsule (10 mg total) by mouth every 8 (eight) hours as needed  for spasms. 05/20/17  Yes Armbruster, Carlota Raspberry, MD  Fluticasone-Salmeterol (ADVAIR) 250-50 MCG/DOSE AEPB Inhale 1 puff into the lungs 2 (two) times daily. 12/08/14  Yes [provider]  furosemide (LASIX) 20 MG tablet TAKE 1 TABLET BY MOUTH DAILY. 12/25/16  Yes Allred, Jeneen Rinks, MD  phenazopyridine (PYRIDIUM) 200 MG tablet Take 1 tablet (200 mg total) 3 (three) times daily as needed by mouth for pain. 06/02/17  Yes McVey, Gelene Mink, PA-C  polyethylene glycol powder (GLYCOLAX/MIRALAX) powder Take 17 g by mouth daily as needed for mild constipation. 07/08/17  Yes Rutherford Guys, MD  ranitidine (ZANTAC) 150 MG tablet TAKE 1 TABLET BY MOUTH 2 TIMES DAILY. 05/13/17  Yes Armbruster, Carlota Raspberry, MD  rosuvastatin (CRESTOR) 20 MG tablet TAKE 1 TABLET BY MOUTH AT BEDTIME. 06/30/17  Yes [name], MD  sacubitril-valsartan (ENTRESTO) 24-26 MG Take 1 tablet by mouth 2 (two) times daily. 09/24/16  Yes Allred, Jeneen Rinks, MD  topiramate (TOPAMAX) 50 MG tablet Take 1 tablet (50 mg total) by mouth daily. 08/04/17  Yes Dennie Bible, NP    Past Medical History:  Diagnosis Date  . AICD (automatic cardioverter/defibrillator) present    Dr.Allred follows  . Aortic arch pseudoaneurysm (HCC)    a. followed by Dr. Prescott Gum.  . Asthma   . Benign neoplasm of colon   . CAD (coronary artery disease) 02/2009   a. anterior STEMI rx with  BMS to prox LAD in 02/2009. b. ISR s/p PTCA 06/2009. c. ISR s/p thrombectomy & PTCA 03/2010 due to late stent thrombosis.   . Chronic renal insufficiency   . Chronic systolic CHF (congestive heart failure) ([place])    a. s/p ST. Jude ICD 2013.  . Colon polyp   . COPD (chronic obstructive pulmonary disease) ([place])   . Diverticulosis   . GI bleed    a. h/o GIB on DAPT, now on ASA only.  Marland Kitchen Headache(784.0)    migraine  . Hearing loss    left ear  . Hyperlipemia   . Hypertension   . Irritable bowel syndrome   . Ischemic cardiomyopathy    EF 10-15%  . Memory loss   .  Myocardial infarct [facility]) 2011 x 2   Dr. Roswell Nickel   . Pulmonary embolism ([place]) 2011  . Transfusion history    ?'12 or '13  . Unspecified mastoiditis     Past Surgical History:  Procedure Laterality Date  . ANGIOPLASTY  07/02/09, 04/01/10  . BILATERAL SALPINGOOPHORECTOMY    . CAD( bare metal stent)  02/2009   x 1  . CERVICAL SPINE SURGERY  08/08  . COLONOSCOPY WITH PROPOFOL N/A 04/29/2016   Procedure: COLONOSCOPY WITH PROPOFOL;  Surgeon: [name], MD;  [facility];  Service: Gastroenterology;  Laterality: N/A;  . ESOPHAGOGASTRODUODENOSCOPY N/A 12/09/2016   Procedure: ESOPHAGOGASTRODUODENOSCOPY (EGD);  Surgeon: [name], MD;  Location: [facility];  Service: Gastroenterology;  Laterality: N/A;  . ESOPHAGOGASTRODUODENOSCOPY (EGD) WITH PROPOFOL N/A 04/29/2016   Procedure: ESOPHAGOGASTRODUODENOSCOPY (EGD) WITH PROPOFOL;  Surgeon: [name], MD;  [facility];  Service: Gastroenterology;  Laterality: N/A;  . EUS N/A 05/15/2016   Procedure: UPPER ENDOSCOPIC ULTRASOUND (EUS) RADIAL;  Surgeon: Milus Banister, MD;  [facility];  Service: Endoscopy;  Laterality: N/A;  . IMPLANTABLE CARDIOVERTER DEFIBRILLATOR IMPLANT N/A 08/05/2011   Primary prevention SJM ICD implanted,  Analyze ST study patient  . INNER EAR SURGERY     left x 17  . LUMBAR DISC SURGERY  02/2008   fusion  . TOTAL ABDOMINAL HYSTERECTOMY     complete    Social History   Tobacco Use  . Smoking status: Former Smoker    Packs/day: 0.25    Types: Cigarettes    Last attempt to quit: 04/27/2009    Years since quitting: 8.4  . Smokeless tobacco: Never Used  Substance Use Topics  . Alcohol use: No    Alcohol/week: 0.0 oz    Family History  Problem Relation Age of Onset  . Colon cancer Mother   . Colon cancer Brother   . Bladder Cancer Brother   . Breast cancer Cousin   . Cancer Son     Review of Systems  Constitutional: Negative for chills  and fever.  Respiratory: Negative for cough and shortness of breath.   Cardiovascular: Negative for chest pain, palpitations and leg swelling.  Gastrointestinal: Negative for abdominal pain, nausea and vomiting.     OBJECTIVE:  Blood pressure 114/80, pulse 77, temperature 98.5 F (36.9 C), temperature source Oral, resp. rate 18, height 5\' 3"  (1.6 m), weight 171 lb 9.6 oz (77.8 kg), SpO2 96 %.  Physical Exam  Constitutional: She is oriented to person, place, and time and well-developed, well-nourished, and in no distress.  HENT:  Head: Normocephalic and atraumatic.  Mouth/Throat: Oropharynx is clear and moist. No oropharyngeal exudate.  Eyes: EOM are normal. Pupils are equal, round, and reactive to light.  No scleral icterus.  Neck: Neck supple.  Cardiovascular: Normal rate, regular rhythm and normal heart sounds. Exam reveals no gallop and no friction rub.  No murmur heard. Pulmonary/Chest: Effort normal and breath sounds normal. She has no wheezes. She has no rales.  Musculoskeletal: She exhibits no edema.  Neurological: She is alert and oriented to person, place, and time. Gait normal.  Skin: Skin is warm and dry.    ASSESSMENT and PLAN  1. Vaginal atrophy Will restart low dose vaginal estrogen again, discussed using along periurethral tissue. R/se/b reviewed.   2. Dysuria  3. Abnormal LFTs - Comprehensive metabolic panel  Other orders - conjugated estrogens (PREMARIN) vaginal cream; Apply 1 fingertip to outside of vagina twice a week - albuterol (PROAIR HFA) 108 (90 Base) MCG/ACT inhaler; Inhale 2 puffs into the lungs every 6 (six) hours as needed. For shortness of breath  Return in about 3 months (around 12/16/2017).    Rutherford Guys, MD Primary [facility] at [facility] [place], [place] [address] Ph.  [phone] Fax [phone]

## 2017-09-19 LAB — COMPREHENSIVE METABOLIC PANEL
ALT: 13 IU/L (ref 0–32)
AST: 14 IU/L (ref 0–40)
Albumin/Globulin Ratio: 1.8 (ref 1.2–2.2)
Albumin: 4.4 g/dL (ref 3.5–4.8)
Alkaline Phosphatase: 99 IU/L (ref 39–117)
BUN/Creatinine Ratio: 20 (ref 12–28)
BUN: 29 mg/dL — ABNORMAL HIGH (ref 8–27)
Bilirubin Total: 0.2 mg/dL (ref 0.0–1.2)
CO2: 18 mmol/L — ABNORMAL LOW (ref 20–29)
Calcium: 9.3 mg/dL (ref 8.7–10.3)
Chloride: 113 mmol/L — ABNORMAL HIGH (ref 96–106)
Creatinine, Ser: 1.42 mg/dL — ABNORMAL HIGH (ref 0.57–1.00)
GFR calc Af Amer: 43 mL/min/{1.73_m2} — ABNORMAL LOW (ref >59)
GFR calc non Af Amer: 37 mL/min/{1.73_m2} — ABNORMAL LOW (ref >59)
Globulin, Total: 2.4 g/dL (ref 1.5–4.5)
Glucose: 84 mg/dL (ref 65–99)
Potassium: 5.1 mmol/L (ref 3.5–5.2)
Sodium: 146 mmol/L — ABNORMAL HIGH (ref 134–144)
Total Protein: 6.8 g/dL (ref 6.0–8.5)

## 2017-09-27 ENCOUNTER — Other Ambulatory Visit: Payer: Self-pay | Admitting: Family Medicine

## 2017-09-27 DIAGNOSIS — N289 Disorder of kidney and ureter, unspecified: Secondary | ICD-10-CM

## 2017-09-29 NOTE — Progress Notes (Signed)
Spoke to pt regarding regarding lab note. Pt will bk calling to make an appt.

## 2017-10-07 ENCOUNTER — Ambulatory Visit: Payer: Medicare Other | Admitting: Family Medicine

## 2017-10-08 ENCOUNTER — Ambulatory Visit (INDEPENDENT_AMBULATORY_CARE_PROVIDER_SITE_OTHER): Payer: Medicare Other | Admitting: Family Medicine

## 2017-10-08 ENCOUNTER — Other Ambulatory Visit: Payer: Self-pay

## 2017-10-08 DIAGNOSIS — N289 Disorder of kidney and ureter, unspecified: Secondary | ICD-10-CM

## 2017-10-08 MED ORDER — AMITRIPTYLINE HCL 50 MG PO TABS: 50.0000 mg | ORAL_TABLET | Freq: Every day | ORAL | 0 refills | Status: DC

## 2017-10-09 ENCOUNTER — Other Ambulatory Visit: Payer: Self-pay | Admitting: Internal Medicine

## 2017-10-09 LAB — BASIC METABOLIC PANEL
BUN/Creatinine Ratio: 15 (ref 12–28)
BUN: 21 mg/dL (ref 8–27)
CO2: 23 mmol/L (ref 20–29)
Calcium: 9.4 mg/dL (ref 8.7–10.3)
Chloride: 109 mmol/L — ABNORMAL HIGH (ref 96–106)
Creatinine, Ser: 1.44 mg/dL — ABNORMAL HIGH (ref 0.57–1.00)
GFR calc Af Amer: 42 mL/min/{1.73_m2} — ABNORMAL LOW (ref >59)
GFR calc non Af Amer: 37 mL/min/{1.73_m2} — ABNORMAL LOW (ref >59)
Glucose: 93 mg/dL (ref 65–99)
Potassium: 4.9 mmol/L (ref 3.5–5.2)
Sodium: 147 mmol/L — ABNORMAL HIGH (ref 134–144)

## 2017-10-19 ENCOUNTER — Encounter: Payer: Self-pay | Admitting: *Deleted

## 2017-10-22 ENCOUNTER — Telehealth: Payer: Self-pay | Admitting: Family Medicine

## 2017-10-22 NOTE — Telephone Encounter (Signed)
Copied from [place]. Topic: Quick Communication - See Telephone Encounter >> Oct 22, 2017  1:02 PM Ivar Drape wrote: CRM for notification. See Telephone encounter for: 10/22/17. Patient stated she was told to call her provider for something to help her cop with her son's death.  He was given two weeks to live and she is taking it hard.

## 2017-10-22 NOTE — Telephone Encounter (Signed)
Sent note to Dr. Pamella Pert

## 2017-10-24 NOTE — Progress Notes (Signed)
Lab visit only, patient not seen

## 2017-10-26 NOTE — Telephone Encounter (Signed)
Patient called to follow on previous request for medication .

## 2017-10-26 NOTE — Telephone Encounter (Signed)
Pt needs office visit to discuss this.

## 2017-10-27 ENCOUNTER — Encounter: Payer: Self-pay | Admitting: Physician Assistant

## 2017-10-27 NOTE — Telephone Encounter (Signed)
Called pt to try and make appt . She was headed over to her son's house and did not know when she would be back in town. She will call for an appt as soon as she can.

## 2017-10-29 ENCOUNTER — Ambulatory Visit (INDEPENDENT_AMBULATORY_CARE_PROVIDER_SITE_OTHER): Payer: Medicare Other | Admitting: Family Medicine

## 2017-10-29 ENCOUNTER — Other Ambulatory Visit: Payer: Self-pay

## 2017-10-29 ENCOUNTER — Encounter: Payer: Self-pay | Admitting: Family Medicine

## 2017-10-29 VITALS — BP 160/80 | HR 100 | Temp 98.0°F | Ht 62.0 in | Wt 176.0 lb

## 2017-10-29 DIAGNOSIS — Z634 Disappearance and death of family member: Secondary | ICD-10-CM

## 2017-10-29 DIAGNOSIS — F4321 Adjustment disorder with depressed mood: Secondary | ICD-10-CM

## 2017-10-29 MED ORDER — ALPRAZOLAM 0.25 MG PO TABS: 0.2500 mg | ORAL_TABLET | Freq: Three times a day (TID) | ORAL | 0 refills | Status: DC | PRN

## 2017-10-29 NOTE — Progress Notes (Signed)
4/4/20192:11 PM  [name] 1945/10/04, 72 y.o. female [id number]  Chief Complaint  Patient presents with  . Anxiety    Mother-in-law of [name], her son just died of cancer. Needing medication to cope during this time of bereavment.    HPI:   Patient is a 72 y.o. female who presents today for worsening anxiety in setting of sudden loss of son. He was recently diagnosed with cancer, placed on hospice and passed away 2 days ago. Having bouts of uncontrollable crying, anxious, not doing well.   Depression screen [facility] 2/9 10/29/2017 09/18/2017 06/19/2017  Decreased Interest (No Data) 0 0  Down, Depressed, Hopeless - 0 0  PHQ - 2 Score - 0 0  Some recent data might be hidden    Allergies  Allergen Reactions  . Lisinopril     cough  . Sulfa Antibiotics Other (See Comments)    Unknown, childhood allergy   . Sulfonamide Derivatives Other (See Comments)    UNSURE    Prior to Admission medications   Medication Sig Start Date End Date Taking? Authorizing Provider  albuterol (PROAIR HFA) 108 (90 Base) MCG/ACT inhaler Inhale 2 puffs into the lungs every 6 (six) hours as needed. For shortness of breath 09/18/17  Yes Rutherford Guys, MD  amitriptyline (ELAVIL) 50 MG tablet Take 1 tablet (50 mg total) by mouth at bedtime. 10/08/17  Yes Rutherford Guys, MD  aspirin 81 MG tablet Take 81 mg by mouth every morning.    Yes [provider]  bisoprolol (ZEBETA) 5 MG tablet Take 1 tablet (5 mg total) by mouth daily. 09/25/16  Yes [name], MD  conjugated estrogens (PREMARIN) vaginal cream Apply 1 fingertip to outside of vagina twice a week 09/18/17  Yes Rutherford Guys, MD  DEXILANT 60 MG capsule TAKE 1 CAPSULE BY MOUTH DAILY. 06/02/17  Yes Armbruster, Carlota Raspberry, MD  dicyclomine (BENTYL) 10 MG capsule Take 1 capsule (10 mg total) by mouth every 8 (eight) hours as needed for spasms. 05/20/17  Yes Armbruster, Carlota Raspberry, MD  ENTRESTO 24-26 MG TAKE 1 TABLET BY MOUTH 2 TIMES  DAILY. 10/09/17  Yes [name], MD  Fluticasone-Salmeterol (ADVAIR) 250-50 MCG/DOSE AEPB Inhale 1 puff into the lungs 2 (two) times daily. 12/08/14  Yes [provider]  furosemide (LASIX) 20 MG tablet TAKE 1 TABLET BY MOUTH DAILY. 12/25/16  Yes Allred, Jeneen Rinks, MD  polyethylene glycol powder (GLYCOLAX/MIRALAX) powder Take 17 g by mouth daily as needed for mild constipation. 07/08/17  Yes Rutherford Guys, MD  ranitidine (ZANTAC) 150 MG tablet TAKE 1 TABLET BY MOUTH 2 TIMES DAILY. 05/13/17  Yes Armbruster, Carlota Raspberry, MD  rosuvastatin (CRESTOR) 20 MG tablet TAKE 1 TABLET BY MOUTH AT BEDTIME. 06/30/17  Yes [name], MD  topiramate (TOPAMAX) 50 MG tablet Take 1 tablet (50 mg total) by mouth daily. 08/04/17  Yes Dennie Bible, NP    Past Medical History:  Diagnosis Date  . AICD (automatic cardioverter/defibrillator) present    Dr.Allred follows  . Aortic arch pseudoaneurysm (HCC)    a. followed by Dr. Prescott Gum.  . Asthma   . Benign neoplasm of colon   . CAD (coronary artery disease) 02/2009   a. anterior STEMI rx with BMS to prox LAD in 02/2009. b. ISR s/p PTCA 06/2009. c. ISR s/p thrombectomy & PTCA 03/2010 due to late stent thrombosis.   . Chronic renal insufficiency   . Chronic systolic CHF (congestive heart failure) ([place])  a. s/p ST. Jude ICD 2013.  . Colon polyp   . COPD (chronic obstructive pulmonary disease) ([place])   . Diverticulosis   . GI bleed    a. h/o GIB on DAPT, now on ASA only.  Marland Kitchen Headache(784.0)    migraine  . Hearing loss    left ear  . Hyperlipemia   . Hypertension   . Irritable bowel syndrome   . Ischemic cardiomyopathy    EF 10-15%  . Memory loss   . Myocardial infarct [facility]) 2011 x 2   Dr. Roswell Nickel   . Pulmonary embolism ([place]) 2011  . Transfusion history    ?'12 or '13  . Unspecified mastoiditis     Past Surgical History:  Procedure Laterality Date  . ANGIOPLASTY  07/02/09, 04/01/10  . BILATERAL SALPINGOOPHORECTOMY    . CAD(  bare metal stent)  02/2009   x 1  . CERVICAL SPINE SURGERY  08/08  . COLONOSCOPY WITH PROPOFOL N/A 04/29/2016   Procedure: COLONOSCOPY WITH PROPOFOL;  Surgeon: [name], MD;  Location: [facility];  Service: Gastroenterology;  Laterality: N/A;  . ESOPHAGOGASTRODUODENOSCOPY N/A 12/09/2016   Procedure: ESOPHAGOGASTRODUODENOSCOPY (EGD);  Surgeon: [name], MD;  Location: [facility];  Service: Gastroenterology;  Laterality: N/A;  . ESOPHAGOGASTRODUODENOSCOPY (EGD) WITH PROPOFOL N/A 04/29/2016   Procedure: ESOPHAGOGASTRODUODENOSCOPY (EGD) WITH PROPOFOL;  Surgeon: [name], MD;  Location: [facility];  Service: Gastroenterology;  Laterality: N/A;  . EUS N/A 05/15/2016   Procedure: UPPER ENDOSCOPIC ULTRASOUND (EUS) RADIAL;  Surgeon: Milus Banister, MD;  Location: [facility];  Service: Endoscopy;  Laterality: N/A;  . IMPLANTABLE CARDIOVERTER DEFIBRILLATOR IMPLANT N/A 08/05/2011   Primary prevention SJM ICD implanted,  Analyze ST study patient  . INNER EAR SURGERY     left x 17  . LUMBAR DISC SURGERY  02/2008   fusion  . TOTAL ABDOMINAL HYSTERECTOMY     complete    Social History   Tobacco Use  . Smoking status: Former Smoker    Packs/day: 0.25    Types: Cigarettes    Last attempt to quit: 04/27/2009    Years since quitting: 8.5  . Smokeless tobacco: Never Used  Substance Use Topics  . Alcohol use: No    Alcohol/week: 0.0 oz    Family History  Problem Relation Age of Onset  . Colon cancer Mother   . Colon cancer Brother   . Bladder Cancer Brother   . Breast cancer Cousin   . Cancer Son     ROS Per hpi  OBJECTIVE:  Blood pressure (!) 160/80, pulse 100, temperature 98 F (36.7 C), temperature source Oral, height 5\' 2"  (1.575 m), weight 176 lb (79.8 kg), SpO2 100 %.  Physical Exam  Constitutional: She is oriented to person, place, and time and well-developed, well-nourished, and in no distress.  HENT:  Head: Normocephalic and  atraumatic.  Mouth/Throat: Mucous membranes are normal.  Eyes: Pupils are equal, round, and reactive to light. EOM are normal. No scleral icterus.  Neck: Neck supple.  Pulmonary/Chest: Effort normal.  Neurological: She is alert and oriented to person, place, and time. Gait normal.  Skin: Skin is warm and dry.  Psychiatric: Her mood appears anxious. Her affect is labile.  Nursing note and vitals reviewed.    ASSESSMENT and PLAN  1. Grief at loss of child Discussed grief process and emotions. Discussed new med r/se/b. Patient will not be driving. RTC precautions given.  Other orders - ALPRAZolam (XANAX) 0.25 MG tablet; Take 1  tablet (0.25 mg total) by mouth 3 (three) times daily as needed for anxiety.  Return if symptoms worsen or fail to improve.    Rutherford Guys, MD [facility] at [place], [place] [address] Ph.  [phone] Fax [phone]

## 2017-10-29 NOTE — Patient Instructions (Signed)
     IF you received an x-ray today, you will receive an invoice from [facility]. Please contact [facility] at [phone] with questions or concerns regarding your invoice.   IF you received labwork today, you will receive an invoice from [facility]. Please contact [facility] at [phone] with questions or concerns regarding your invoice.   Our billing staff will not be able to assist you with questions regarding bills from these companies.  You will be contacted with the lab results as soon as they are available. The fastest way to get your results is to activate your My Chart account. Instructions are located on the last page of this paperwork. If you have not heard from us regarding the results in 2 weeks, please contact this office.     

## 2017-10-30 ENCOUNTER — Ambulatory Visit: Payer: Medicare Other | Admitting: Family Medicine

## 2017-11-04 NOTE — Telephone Encounter (Signed)
This is an old message, patient has been seen and medication has been prescribed.

## 2017-11-11 ENCOUNTER — Other Ambulatory Visit: Payer: Self-pay | Admitting: Gastroenterology

## 2017-11-11 DIAGNOSIS — K219 Gastro-esophageal reflux disease without esophagitis: Secondary | ICD-10-CM

## 2017-11-16 ENCOUNTER — Ambulatory Visit (INDEPENDENT_AMBULATORY_CARE_PROVIDER_SITE_OTHER): Payer: Medicare Other | Admitting: *Deleted

## 2017-11-16 DIAGNOSIS — I255 Ischemic cardiomyopathy: Secondary | ICD-10-CM | POA: Diagnosis not present

## 2017-11-16 NOTE — Progress Notes (Signed)
Remote ICD transmission.   

## 2017-11-17 ENCOUNTER — Encounter: Payer: Self-pay | Admitting: Cardiology

## 2017-11-17 LAB — CUP PACEART REMOTE DEVICE CHECK
Implantable Lead Implant Date: 20130108
Implantable Lead Location: 753860
Implantable Pulse Generator Implant Date: 20130108
MDC IDC PG SERIAL: 1016523
MDC IDC SESS DTM: 20190423132558

## 2017-11-20 ENCOUNTER — Telehealth: Payer: Self-pay | Admitting: Cardiovascular Disease

## 2017-11-20 ENCOUNTER — Encounter: Payer: Self-pay | Admitting: Internal Medicine

## 2017-11-20 NOTE — Telephone Encounter (Signed)
Scheduling called patient to arrange follow-up. [name] aware.

## 2017-11-20 NOTE — Telephone Encounter (Signed)
New Message: ° ° ° ° ° ° °Pt is returning a call °

## 2017-11-21 ENCOUNTER — Other Ambulatory Visit: Payer: Self-pay

## 2017-11-21 ENCOUNTER — Ambulatory Visit (INDEPENDENT_AMBULATORY_CARE_PROVIDER_SITE_OTHER): Payer: Medicare Other | Admitting: Family Medicine

## 2017-11-21 ENCOUNTER — Encounter: Payer: Self-pay | Admitting: Family Medicine

## 2017-11-21 VITALS — BP 130/82 | HR 89 | Temp 98.7°F | Resp 16 | Ht 62.0 in | Wt 171.8 lb

## 2017-11-21 DIAGNOSIS — Z634 Disappearance and death of family member: Secondary | ICD-10-CM

## 2017-11-21 DIAGNOSIS — F4321 Adjustment disorder with depressed mood: Secondary | ICD-10-CM

## 2017-11-21 DIAGNOSIS — G43709 Chronic migraine without aura, not intractable, without status migrainosus: Secondary | ICD-10-CM

## 2017-11-21 DIAGNOSIS — R079 Chest pain, unspecified: Secondary | ICD-10-CM | POA: Diagnosis not present

## 2017-11-21 MED ORDER — ONDANSETRON HCL 4 MG PO TABS: 4.0000 mg | ORAL_TABLET | Freq: Three times a day (TID) | ORAL | 0 refills | Status: DC | PRN

## 2017-11-21 MED ORDER — TOPIRAMATE 50 MG PO TABS: 100.0000 mg | ORAL_TABLET | Freq: Every day | ORAL | 0 refills | Status: DC

## 2017-11-21 NOTE — Patient Instructions (Addendum)
IF you received an x-ray today, you will receive an invoice from [facility]. Please contact [facility] at [phone] with questions or concerns regarding your invoice.   IF you received labwork today, you will receive an invoice from [facility]. Please contact [facility] at [phone] with questions or concerns regarding your invoice.   Our billing staff will not be able to assist you with questions regarding bills from these companies.  You will be contacted with the lab results as soon as they are available. The fastest way to get your results is to activate your My Chart account. Instructions are located on the last page of this paperwork. If you have not heard from [place] regarding the results in 2 weeks, please contact this office.      Migraine Headache A migraine headache is an intense, throbbing pain on one side or both sides of the head. Migraines may also cause other symptoms, such as nausea, vomiting, and sensitivity to light and noise. What are the causes? Doing or taking certain things may also trigger migraines, such as:  Alcohol.  Smoking.  Medicines, such as: ? Medicine used to treat chest pain (nitroglycerine). ? Birth control pills. ? Estrogen pills. ? Certain blood pressure medicines.  Aged cheeses, chocolate, or caffeine.  Foods or drinks that contain nitrates, glutamate, aspartame, or tyramine.  Physical activity.  Other things that may trigger a migraine include:  Menstruation.  Pregnancy.  Hunger.  Stress, lack of sleep, too much sleep, or fatigue.  Weather changes.  What increases the risk? The following factors may make you more likely to experience migraine headaches:  Age. Risk increases with age.  Family history of migraine headaches.  Being Caucasian.  Depression and anxiety.  Obesity.  Being a woman.  Having a hole in the heart (patent foramen ovale) or other heart problems.  What are the signs or  symptoms? The main symptom of this condition is pulsating or throbbing pain. Pain may:  Happen in any area of the head, such as on one side or both sides.  Interfere with daily activities.  Get worse with physical activity.  Get worse with exposure to bright lights or loud noises.  Other symptoms may include:  Nausea.  Vomiting.  Dizziness.  General sensitivity to bright lights, loud noises, or smells.  Before you get a migraine, you may get warning signs that a migraine is developing (aura). An aura may include:  Seeing flashing lights or having blind spots.  Seeing bright spots, halos, or zigzag lines.  Having tunnel vision or blurred vision.  Having numbness or a tingling feeling.  Having trouble talking.  Having muscle weakness.  How is this diagnosed? A migraine headache can be diagnosed based on:  Your symptoms.  A physical exam.  Tests, such as CT scan or MRI of the head. These imaging tests can help rule out other causes of headaches.  Taking fluid from the spine (lumbar puncture) and analyzing it (cerebrospinal fluid analysis, or CSF analysis).  How is this treated? A migraine headache is usually treated with medicines that:  Relieve pain.  Relieve nausea.  Prevent migraines from coming back.  Treatment may also include:  Acupuncture.  Lifestyle changes like avoiding foods that trigger migraines.  Follow these instructions at home: Medicines  Take over-the-counter and prescription medicines only as told by your health care provider.  Do not drive or use heavy machinery while taking prescription pain medicine.  To prevent or treat constipation while you  are taking prescription pain medicine, your health care provider may recommend that you: ? Drink enough fluid to keep your urine clear or pale yellow. ? Take over-the-counter or prescription medicines. ? Eat foods that are high in fiber, such as fresh fruits and vegetables, whole grains,  and beans. ? Limit foods that are high in fat and processed sugars, such as fried and sweet foods. Lifestyle  Avoid alcohol use.  Do not use any products that contain nicotine or tobacco, such as cigarettes and e-cigarettes. If you need help quitting, ask your health care provider.  Get at least 8 hours of sleep every night.  Limit your stress. General instructions   Keep a journal to find out what may trigger your migraine headaches. For example, write down: ? What you eat and drink. ? How much sleep you get. ? Any change to your diet or medicines.  If you have a migraine: ? Avoid things that make your symptoms worse, such as bright lights. ? It may help to lie down in a dark, quiet room. ? Do not drive or use heavy machinery. ? Ask your health care provider what activities are safe for you while you are experiencing symptoms.  Keep all follow-up visits as told by your health care provider. This is important. Contact a health care provider if:  You develop symptoms that are different or more severe than your usual migraine symptoms. Get help right away if:  Your migraine becomes severe.  You have a fever.  You have a stiff neck.  You have vision loss.  Your muscles feel weak or like you cannot control them.  You start to lose your balance often.  You develop trouble walking.  You faint. This information is not intended to replace advice given to you by your health care provider. Make sure you discuss any questions you have with your health care provider. Document Released: 07/14/2005 Document Revised: 02/01/2016 Document Reviewed: 12/31/2015 Elsevier Interactive Patient Education  2017 [employer].

## 2017-11-21 NOTE — Progress Notes (Signed)
Chief Complaint  Patient presents with  . Nausea    headache, and nausea and vomiting     HPI  Migraines Patient reports that she has a history of migraines but it seems like every year around this time she gets migraines, nausea and vomiting She reports that it typically gets bad until about June She reports that she had allergy testing a long time ago She takes topamax every night for her chronic migraines She reports that her migraines are about the same as they always are and located in the same place and would rate this pain 7/10 Located in the crown of the head   Grieving and Left side chest pain While talking about the recent death of her son the patient started to clutch the left chest and pushed up her bra to rub the left chest. She states that it feels like indigestion.  She states that she has been having difficulty sleeping. She ate breakfast this morning. She has only been sleeping 2 hours at a time.   Past Medical History:  Diagnosis Date  . AICD (automatic cardioverter/defibrillator) present    Dr.Allred follows  . Aortic arch pseudoaneurysm (HCC)    a. followed by Dr. Prescott Gum.  . Asthma   . Benign neoplasm of colon   . CAD (coronary artery disease) 02/2009   a. anterior STEMI rx with BMS to prox LAD in 02/2009. b. ISR s/p PTCA 06/2009. c. ISR s/p thrombectomy & PTCA 03/2010 due to late stent thrombosis.   . Chronic renal insufficiency   . Chronic systolic CHF (congestive heart failure) ([place])    a. s/p ST. Jude ICD 2013.  . Colon polyp   . COPD (chronic obstructive pulmonary disease) ([place])   . Diverticulosis   . GI bleed    a. h/o GIB on DAPT, now on ASA only.  Marland Kitchen Headache(784.0)    migraine  . Hearing loss    left ear  . Hyperlipemia   . Hypertension   . Irritable bowel syndrome   . Ischemic cardiomyopathy    EF 10-15%  . Memory loss   . Myocardial infarct [facility]) 2011 x 2   Dr. Roswell Nickel   . Pulmonary embolism ([place]) 2011  . Transfusion history     ?'12 or '13  . Unspecified mastoiditis     Current Outpatient Medications  Medication Sig Dispense Refill  . albuterol (PROAIR HFA) 108 (90 Base) MCG/ACT inhaler Inhale 2 puffs into the lungs every 6 (six) hours as needed. For shortness of breath 18 g 5  . ALPRAZolam (XANAX) 0.25 MG tablet Take 1 tablet (0.25 mg total) by mouth 3 (three) times daily as needed for anxiety. 30 tablet 0  . amitriptyline (ELAVIL) 50 MG tablet Take 1 tablet (50 mg total) by mouth at bedtime. 90 tablet 0  . aspirin 81 MG tablet Take 81 mg by mouth every morning.     . bisoprolol (ZEBETA) 5 MG tablet Take 1 tablet (5 mg total) by mouth daily. 30 tablet 11  . conjugated estrogens (PREMARIN) vaginal cream Apply 1 fingertip to outside of vagina twice a week 30 g 12  . DEXILANT 60 MG capsule TAKE 1 CAPSULE BY MOUTH DAILY. 30 capsule 3  . dicyclomine (BENTYL) 10 MG capsule Take 1 capsule (10 mg total) by mouth every 8 (eight) hours as needed for spasms. 30 capsule 3  . ENTRESTO 24-26 MG TAKE 1 TABLET BY MOUTH 2 TIMES DAILY. 180 tablet 1  . Fluticasone-Salmeterol (ADVAIR) 250-50  MCG/DOSE AEPB Inhale 1 puff into the lungs 2 (two) times daily.    . furosemide (LASIX) 20 MG tablet TAKE 1 TABLET BY MOUTH DAILY. 30 tablet 10  . polyethylene glycol powder (GLYCOLAX/MIRALAX) powder Take 17 g by mouth daily as needed for mild constipation. 255 g 11  . ranitidine (ZANTAC) 150 MG tablet TAKE 1 TABLET BY MOUTH 2 TIMES DAILY. 180 tablet 0  . rosuvastatin (CRESTOR) 20 MG tablet TAKE 1 TABLET BY MOUTH AT BEDTIME. 90 tablet 3  . topiramate (TOPAMAX) 50 MG tablet Take 2 tablets (100 mg total) by mouth daily. 90 tablet 0  . ondansetron (ZOFRAN) 4 MG tablet Take 1 tablet (4 mg total) by mouth every 8 (eight) hours as needed for nausea or vomiting. 20 tablet 0   No current facility-administered medications for this visit.     Allergies:  Allergies  Allergen Reactions  . Lisinopril     cough  . Sulfa Antibiotics Other (See  Comments)    Unknown, childhood allergy   . Sulfonamide Derivatives Other (See Comments)    UNSURE    Past Surgical History:  Procedure Laterality Date  . ANGIOPLASTY  07/02/09, 04/01/10  . BILATERAL SALPINGOOPHORECTOMY    . CAD( bare metal stent)  02/2009   x 1  . CERVICAL SPINE SURGERY  08/08  . COLONOSCOPY WITH PROPOFOL N/A 04/29/2016   Procedure: COLONOSCOPY WITH PROPOFOL;  Surgeon: [name], MD;  [facility]: [facility];  Service: Gastroenterology;  Laterality: N/A;  . ESOPHAGOGASTRODUODENOSCOPY N/A 12/09/2016   Procedure: ESOPHAGOGASTRODUODENOSCOPY (EGD);  Surgeon: [name], MD;  Location: [facility];  Service: Gastroenterology;  Laterality: N/A;  . ESOPHAGOGASTRODUODENOSCOPY (EGD) WITH PROPOFOL N/A 04/29/2016   Procedure: ESOPHAGOGASTRODUODENOSCOPY (EGD) WITH PROPOFOL;  Surgeon: [name], MD;  [facility];  Service: Gastroenterology;  Laterality: N/A;  . EUS N/A 05/15/2016   Procedure: UPPER ENDOSCOPIC ULTRASOUND (EUS) RADIAL;  Surgeon: Milus Banister, MD;  Location: [facility];  Service: Endoscopy;  Laterality: N/A;  . IMPLANTABLE CARDIOVERTER DEFIBRILLATOR IMPLANT N/A 08/05/2011   Primary prevention SJM ICD implanted,  Analyze ST study patient  . INNER EAR SURGERY     left x 17  . LUMBAR DISC SURGERY  02/2008   fusion  . TOTAL ABDOMINAL HYSTERECTOMY     complete    Social History   Socioeconomic History  . Marital status: Divorced    Spouse name: Not on file  . Number of children: 2  . Years of education: 18  . Highest education level: Not on file  Occupational History  . Occupation: Merchandiser, retail: UNEMPLOYED    Employer: DISABLED  Social Needs  . Financial resource strain: Not on file  . Food insecurity:    Worry: Not on file    Inability: Not on file  . Transportation needs:    Medical: Not on file    Non-medical: Not on file  Tobacco Use  . Smoking status: Former Smoker    Packs/day: 0.25     Types: Cigarettes    Last attempt to quit: 04/27/2009    Years since quitting: 8.5  . Smokeless tobacco: Never Used  Substance and Sexual Activity  . Alcohol use: No    Alcohol/week: 0.0 oz  . Drug use: No  . Sexual activity: Not on file  Lifestyle  . Physical activity:    Days per week: Not on file    Minutes per session: Not on file  . Stress: Not on file  Relationships  . Social connections:    Talks on phone: Not on file    Gets together: Not on file    Attends religious service: Not on file    Active member of club or organization: Not on file    Attends meetings of clubs or organizations: Not on file    Relationship status: Not on file  Other Topics Concern  . Not on file  Social History Narrative   Pt lives in [place] alone.  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   Patient has 11 th grade education.Right handed.   Caffeine- one cup daily          Family History  Problem Relation Age of Onset  . Colon cancer Mother   . Colon cancer Brother   . Bladder Cancer Brother   . Breast cancer Cousin   . Cancer Son      ROS Review of Systems See HPI Constitution: No fevers or chills No malaise No diaphoresis Skin: No rash or itching Eyes: no blurry vision, no double vision GU: no dysuria or hematuria Neuro: no dizziness or headaches all others reviewed and negative   Objective: Vitals:   11/21/17 1053  BP: 130/82  Pulse: 89  Resp: 16  Temp: 98.7 F (37.1 C)  SpO2: 96%  Weight: 171 lb 12.8 oz (77.9 kg)  Height: 5\' 2"  (1.575 m)    Physical Exam  Constitutional: She is oriented to person, place, and time. She appears well-developed and well-nourished.  HENT:  Head: Normocephalic and atraumatic.  Eyes: Conjunctivae and EOM are normal.  Cardiovascular: Normal rate, regular rhythm and normal heart sounds.  No murmur heard. Pulmonary/Chest: Effort normal and breath sounds normal. No stridor. No respiratory distress. She has no wheezes. Chest  wall is not dull to percussion. She exhibits tenderness. She exhibits no mass, no bony tenderness, no laceration, no crepitus, no edema, no deformity, no swelling and no retraction.    Neurological: She is alert and oriented to person, place, and time. She displays normal reflexes. No cranial nerve deficit or sensory deficit. She exhibits normal muscle tone. Coordination normal.  Skin: Skin is warm. Capillary refill takes less than 2 seconds.  Psychiatric: She has a normal mood and affect. Her behavior is normal. Judgment and thought content normal.     ECG showing  Sinus rhythm, no twi, no st elevation  Assessment and Plan Dequita was seen today for nausea.  Diagnoses and all orders for this visit:  Left-sided chest pain- ecg reassuring Symptoms resolved with deep breathing and laying supine -     EKG 12-Lead  Grief at loss of child- discussed sleep and resting during the day time  Chronic migraine without aura without status migrainosus, not intractable- pt advised to increase topomax to try to prevent headaches frequency She should also resume flonase since pt having exacerbations due to pollen  Other orders -     Discontinue: topiramate (TOPAMAX) 50 MG tablet; Take 2 tablets (100 mg total) by mouth daily. -     Discontinue: ondansetron (ZOFRAN) 4 MG tablet; Take 1 tablet (4 mg total) by mouth every 8 (eight) hours as needed for nausea or vomiting. -     topiramate (TOPAMAX) 50 MG tablet; Take 2 tablets (100 mg total) by mouth daily. -     ondansetron (ZOFRAN) 4 MG tablet; Take 1 tablet (4 mg total) by mouth every 8 (eight) hours as needed for nausea or vomiting.     Myli Pae A  Nolon Rod

## 2017-12-07 ENCOUNTER — Encounter: Payer: Self-pay | Admitting: Cardiovascular Disease

## 2017-12-07 ENCOUNTER — Ambulatory Visit (INDEPENDENT_AMBULATORY_CARE_PROVIDER_SITE_OTHER): Payer: Medicare Other | Admitting: Cardiovascular Disease

## 2017-12-07 VITALS — BP 132/82 | HR 98 | Ht 62.0 in | Wt 174.4 lb

## 2017-12-07 DIAGNOSIS — I2510 Atherosclerotic heart disease of native coronary artery without angina pectoris: Secondary | ICD-10-CM | POA: Diagnosis not present

## 2017-12-07 DIAGNOSIS — I5022 Chronic systolic (congestive) heart failure: Secondary | ICD-10-CM

## 2017-12-07 NOTE — Progress Notes (Signed)
Cardiology Office Note Date:  12/07/2017   ID:  [name], DOB 1945/11/23, MRN [id number]  PCP:  Rutherford Guys, MD  Cardiologist:  [name], MD    Chief Complaint  Patient presents with  . Congestive Heart Failure    History of Present Illness: [name] is a 72 y.o. female who presents for follow-up evaluation. The patient has a history of coronary disease with anterior wall MI. She's had multiple LAD interventions and ultimately she had stent thrombosis in 2011. She's had severe residual left ventricular systolic dysfunction and has undergone ICD implantation. She's been maintained only on low-dose aspirin because of GI bleeding on dual antiplatelet therapy. She was seen by Dr Haroldine Laws in the [facility] who felt she was not a candidate for advanced therapies because of memory problems and COPD. Also has been followed for an aortic pseudoaneurysm of the distal arch by Dr Prescott Gum - this is felt to be stable by serial CT imaging and the patient has been asymptomatic in that regard.   She's here alone today. She has has a tough time over the last few months since her son has died of cancer.  She quit taking all of her medicines but has resumed her normal medications now.  She denies any change in symptoms since she was last seen here.  She has stable shortness of breath with activity.  No chest pain, orthopnea, PND, leg swelling, or dyspnea at rest.  No lightheadedness or syncope.  No ICD discharges since her last visit.  Past Medical History:  Diagnosis Date  . AICD (automatic cardioverter/defibrillator) present    Dr.Allred follows  . Aortic arch pseudoaneurysm (HCC)    a. followed by Dr. Prescott Gum.  . Asthma   . Benign neoplasm of colon   . CAD (coronary artery disease) 02/2009   a. anterior STEMI rx with BMS to prox LAD in 02/2009. b. ISR s/p PTCA 06/2009. c. ISR s/p thrombectomy & PTCA 03/2010 due to late stent thrombosis.   . Chronic renal  insufficiency   . Chronic systolic CHF (congestive heart failure) ([place])    a. s/p ST. Jude ICD 2013.  . Colon polyp   . COPD (chronic obstructive pulmonary disease) ([place])   . Diverticulosis   . GI bleed    a. h/o GIB on DAPT, now on ASA only.  Marland Kitchen Headache(784.0)    migraine  . Hearing loss    left ear  . Hyperlipemia   . Hypertension   . Irritable bowel syndrome   . Ischemic cardiomyopathy    EF 10-15%  . Memory loss   . Myocardial infarct [facility]) 2011 x 2   Dr. Roswell Nickel   . Pulmonary embolism ([place]) 2011  . Transfusion history    ?'12 or '13  . Unspecified mastoiditis     Past Surgical History:  Procedure Laterality Date  . ANGIOPLASTY  07/02/09, 04/01/10  . BILATERAL SALPINGOOPHORECTOMY    . CAD( bare metal stent)  02/2009   x 1  . CERVICAL SPINE SURGERY  08/08  . COLONOSCOPY WITH PROPOFOL N/A 04/29/2016   Procedure: COLONOSCOPY WITH PROPOFOL;  Surgeon: [name], MD;  [facility];  Service: Gastroenterology;  Laterality: N/A;  . ESOPHAGOGASTRODUODENOSCOPY N/A 12/09/2016   Procedure: ESOPHAGOGASTRODUODENOSCOPY (EGD);  Surgeon: [name], MD;  Location: [facility];  Service: Gastroenterology;  Laterality: N/A;  . ESOPHAGOGASTRODUODENOSCOPY (EGD) WITH PROPOFOL N/A 04/29/2016   Procedure: ESOPHAGOGASTRODUODENOSCOPY (EGD) WITH PROPOFOL;  Surgeon:  [name], MD;  Location: [facility];  Service: Gastroenterology;  Laterality: N/A;  . EUS N/A 05/15/2016   Procedure: UPPER ENDOSCOPIC ULTRASOUND (EUS) RADIAL;  Surgeon: Milus Banister, MD;  [facility];  Service: Endoscopy;  Laterality: N/A;  . IMPLANTABLE CARDIOVERTER DEFIBRILLATOR IMPLANT N/A 08/05/2011   Primary prevention SJM ICD implanted,  Analyze ST study patient  . INNER EAR SURGERY     left x 17  . LUMBAR DISC SURGERY  02/2008   fusion  . TOTAL ABDOMINAL HYSTERECTOMY     complete    Current Outpatient Medications  Medication Sig Dispense Refill  .  albuterol (PROAIR HFA) 108 (90 Base) MCG/ACT inhaler Inhale 2 puffs into the lungs every 6 (six) hours as needed. For shortness of breath 18 g 5  . ALPRAZolam (XANAX) 0.25 MG tablet Take 1 tablet (0.25 mg total) by mouth 3 (three) times daily as needed for anxiety. 30 tablet 0  . amitriptyline (ELAVIL) 50 MG tablet Take 1 tablet (50 mg total) by mouth at bedtime. 90 tablet 0  . aspirin 81 MG tablet Take 81 mg by mouth every morning.     . bisoprolol (ZEBETA) 5 MG tablet Take 1 tablet (5 mg total) by mouth daily. 30 tablet 11  . conjugated estrogens (PREMARIN) vaginal cream Apply 1 fingertip to outside of vagina twice a week 30 g 12  . DEXILANT 60 MG capsule TAKE 1 CAPSULE BY MOUTH DAILY. 30 capsule 3  . dicyclomine (BENTYL) 10 MG capsule Take 1 capsule (10 mg total) by mouth every 8 (eight) hours as needed for spasms. 30 capsule 3  . ENTRESTO 24-26 MG TAKE 1 TABLET BY MOUTH 2 TIMES DAILY. 180 tablet 1  . Fluticasone-Salmeterol (ADVAIR) 250-50 MCG/DOSE AEPB Inhale 1 puff into the lungs 2 (two) times daily.    . furosemide (LASIX) 20 MG tablet TAKE 1 TABLET BY MOUTH DAILY. 30 tablet 10  . ondansetron (ZOFRAN) 4 MG tablet Take 1 tablet (4 mg total) by mouth every 8 (eight) hours as needed for nausea or vomiting. 20 tablet 0  . polyethylene glycol powder (GLYCOLAX/MIRALAX) powder Take 17 g by mouth daily as needed for mild constipation. 255 g 11  . ranitidine (ZANTAC) 150 MG tablet TAKE 1 TABLET BY MOUTH 2 TIMES DAILY. 180 tablet 0  . rosuvastatin (CRESTOR) 20 MG tablet TAKE 1 TABLET BY MOUTH AT BEDTIME. 90 tablet 3  . topiramate (TOPAMAX) 50 MG tablet Take 2 tablets (100 mg total) by mouth daily. 90 tablet 0   No current facility-administered medications for this visit.     Allergies:   Lisinopril; Sulfa antibiotics; and Sulfonamide derivatives   Social History:  The patient  reports that she quit smoking about 8 years ago. Her smoking use included cigarettes. She smoked 0.25 packs per day. She  has never used smokeless tobacco. She reports that she does not drink alcohol or use drugs.   Family History:  The patient's family history includes Bladder Cancer in her brother; Breast cancer in her cousin; Cancer in her son; Colon cancer in her brother and mother.    ROS:  Please see the history of present illness. All other systems are reviewed and negative.    PHYSICAL EXAM: VS:  BP 132/82   Pulse 98   Ht 5\' 2"  (1.575 m)   Wt 174 lb 6.4 oz (79.1 kg)   SpO2 96%   BMI 31.90 kg/m  , BMI Body mass index is 31.9 kg/m. GEN: Well nourished,  well developed, in no acute distress  HEENT: normal  Neck: no JVD, no masses. No carotid bruits Cardiac: RRR without murmur or gallop      Respiratory:  clear to auscultation bilaterally, normal work of breathing GI: soft, nontender, nondistended, + BS MS: no deformity or atrophy  Ext: no pretibial edema, pedal pulses 2+= bilaterally Skin: warm and dry, no rash Neuro:  Strength and sensation are intact Psych: euthymic mood, full affect  EKG:  EKG is not ordered today.  Recent Labs: 06/08/2017: Hemoglobin 12.2; Platelets 232; TSH 1.250 09/18/2017: ALT 13 10/08/2017: BUN 21; Creatinine, Ser 1.44; Potassium 4.9; Sodium 147   Lipid Panel     Component Value Date/Time   CHOL 212 (H) 06/08/2017 1230   TRIG 184 (H) 06/08/2017 1230   HDL 73 06/08/2017 1230   CHOLHDL 2.9 06/08/2017 1230   CHOLHDL 2.7 01/15/2016 1345   VLDL 45 (H) 01/15/2016 1345   LDLCALC 102 (H) 06/08/2017 1230      Wt Readings from Last 3 Encounters:  12/07/17 174 lb 6.4 oz (79.1 kg)  11/21/17 171 lb 12.8 oz (77.9 kg)  10/29/17 176 lb (79.8 kg)     Cardiac Studies Reviewed: 2D Echo 10-22-2016: Study Conclusions  - Impressions: Limited echo for EF. Severe LVE. EF 25-30 % Abnormal   septal motion septal, apical and inferior wall hypokinesis   RV normal size with pacing wire seen in cavity.  Impressions:  - Limited echo for EF. Severe LVE. EF 25-30% Abnormal  septal motion   septal, apical and inferior wall hypokinesis   RV normal size with pacing wire seen in cavity.  ------------------------------------------------------------------- Study data:  Comparison was made to the study of 09/01/2016.  Study status:  Routine.  Procedure:  The patient reported no pain pre or post test. Transthoracic echocardiography. Image quality was adequate.  Study completion:  There were no complications. Transthoracic echocardiography.  M-mode, limited 2D, limited spectral Doppler, and color Doppler.  Birthdate:  Patient birthdate: 11/26/1945.  Age:  Patient is 72 yr old.  Sex:  Gender: female.    BMI: 30.9 kg/m^2.  Blood pressure:     110/74  Patient status:  Inpatient.  Study date:  Study date: 10/22/2016. Study time: 12:06 PM.  Location:  Bedside.  -------------------------------------------------------------------  ------------------------------------------------------------------- Measurements   Left ventricle                             Value        Reference  LV ID, ED, PLAX chordal                    49    mm     43 - 52  LV ID, ES, PLAX chordal                    36.6  mm     23 - 38  LV fx shortening, PLAX chordal   (L)       25    %      >=29  LV PW thickness, ED                        10    mm     ---------  IVS/LV PW ratio, ED                        0.95         <=  1.3  LV ejection fraction, 1-p A4C              24    %      ---------  LV end-diastolic volume, 2-p               110   ml     ---------  LV end-systolic volume, 2-p                78    ml     ---------  LV ejection fraction, 2-p                  29    %      ---------  Stroke volume, 2-p                         32    ml     ---------  LV end-diastolic volume/bsa, 2-p           58    ml/m^2 ---------  LV end-systolic volume/bsa, 2-p            41    ml/m^2 ---------  Stroke volume/bsa, 2-p                     16.7  ml/m^2 ---------    Ventricular septum                          Value        Reference  IVS thickness, ED                          9.46  mm     ---------    LVOT                                       Value        Reference  LVOT ID, S                                 20    mm     ---------  LVOT area                                  3.14  cm^2   ---------    Aorta                                      Value        Reference  Aortic root ID, ED                         24    mm     ---------    Left atrium                                Value        Reference  LA ID, A-P, ES  36    mm     ---------  LA ID/bsa, A-P                             1.89  cm/m^2 <=2.2  ASSESSMENT AND PLAN: 1.  Chronic systolic heart failure, New York Heart Association functional class II symptoms.  Patient with multifactorial shortness of breath related to COPD and chronic heart failure.  Her most recent echocardiogram from 1 year ago was reviewed demonstrating an LVEF of 25 to 30%.  She is treated with bisoprolol and Entresto.  Appears euvolemic on exam.  No signs or symptoms of volume excess.  2.  Coronary artery disease, native vessel: The patient has no anginal symptoms.  She is tolerating aspirin for antiplatelet therapy.  She is on a statin drug with Crestor 20 mg daily.  3.  Ischemic cardiomyopathy: Patient status post ICD.  No interval ICD discharges.  Followed by the EP team.  4.  Hyperlipidemia: Treated with rosuvastatin 20 mg daily.  Lifestyle modification discussed.  5.  Chronic kidney disease stage 3: Stable labs with recent blood work reviewed today.  6.  Pseudoaneurysm of the thoracic aorta: Patient has done well with medical therapy and surveillance over the last several years.  7. Grief: lengthy discussion today. Recommended that she reach out to Hospice for resources/groups to help her deal with the loss of her son.   Current medicines are reviewed with the patient today.  The patient does not have concerns regarding  medicines.  Labs/ tests ordered today include:  No orders of the defined types were placed in this encounter.   Disposition:   FU 6 months APP, one year with me.   Signed, [name], MD  12/07/2017 9:05 AM    [address], [place], [place]  [address] Phone: [phone]; Fax: [phone]

## 2017-12-07 NOTE — Patient Instructions (Signed)
Medication Instructions:  Your provider recommends that you continue on your current medications as directed. Please refer to the Current Medication list given to you today.    Labwork: None  Testing/Procedures: None  Follow-Up: Your provider wants you to follow-up in: 6 months with Dr. Antionette Char assistant. You will receive a reminder letter in the mail two months in advance. If you don't receive a letter, please call our office to schedule the follow-up appointment.    Your provider wants you to follow-up in: 1 year with Dr. Burt Knack. You will receive a reminder letter in the mail two months in advance. If you don't receive a letter, please call our office to schedule the follow-up appointment.    Any Other Special Instructions Will Be Listed Below (If Applicable).     If you need a refill on your cardiac medications before your next appointment, please call your pharmacy.

## 2017-12-08 ENCOUNTER — Other Ambulatory Visit: Payer: Self-pay | Admitting: *Deleted

## 2017-12-08 DIAGNOSIS — I712 Thoracic aortic aneurysm, without rupture, unspecified: Secondary | ICD-10-CM

## 2017-12-14 ENCOUNTER — Other Ambulatory Visit: Payer: Self-pay | Admitting: Cardiovascular Disease

## 2017-12-18 ENCOUNTER — Ambulatory Visit (INDEPENDENT_AMBULATORY_CARE_PROVIDER_SITE_OTHER): Payer: Medicare Other | Admitting: Family Medicine

## 2017-12-18 ENCOUNTER — Other Ambulatory Visit: Payer: Self-pay

## 2017-12-18 ENCOUNTER — Encounter: Payer: Self-pay | Admitting: Family Medicine

## 2017-12-18 VITALS — BP 140/70 | HR 100 | Temp 99.0°F | Ht 63.39 in | Wt 171.0 lb

## 2017-12-18 DIAGNOSIS — G43009 Migraine without aura, not intractable, without status migrainosus: Secondary | ICD-10-CM | POA: Insufficient documentation

## 2017-12-18 DIAGNOSIS — N289 Disorder of kidney and ureter, unspecified: Secondary | ICD-10-CM | POA: Diagnosis not present

## 2017-12-18 DIAGNOSIS — G4700 Insomnia, unspecified: Secondary | ICD-10-CM | POA: Diagnosis not present

## 2017-12-18 DIAGNOSIS — N189 Chronic kidney disease, unspecified: Secondary | ICD-10-CM | POA: Insufficient documentation

## 2017-12-18 DIAGNOSIS — E2839 Other primary ovarian failure: Secondary | ICD-10-CM | POA: Diagnosis not present

## 2017-12-18 DIAGNOSIS — I5022 Chronic systolic (congestive) heart failure: Secondary | ICD-10-CM | POA: Insufficient documentation

## 2017-12-18 DIAGNOSIS — I10 Essential (primary) hypertension: Secondary | ICD-10-CM | POA: Diagnosis not present

## 2017-12-18 DIAGNOSIS — Z95810 Presence of automatic (implantable) cardiac defibrillator: Secondary | ICD-10-CM | POA: Insufficient documentation

## 2017-12-18 DIAGNOSIS — Z1231 Encounter for screening mammogram for malignant neoplasm of breast: Secondary | ICD-10-CM | POA: Diagnosis not present

## 2017-12-18 DIAGNOSIS — F4321 Adjustment disorder with depressed mood: Secondary | ICD-10-CM

## 2017-12-18 DIAGNOSIS — I5020 Unspecified systolic (congestive) heart failure: Secondary | ICD-10-CM | POA: Insufficient documentation

## 2017-12-18 DIAGNOSIS — Z634 Disappearance and death of family member: Secondary | ICD-10-CM | POA: Diagnosis not present

## 2017-12-18 DIAGNOSIS — E785 Hyperlipidemia, unspecified: Secondary | ICD-10-CM | POA: Insufficient documentation

## 2017-12-18 DIAGNOSIS — R7303 Prediabetes: Secondary | ICD-10-CM

## 2017-12-18 DIAGNOSIS — J449 Chronic obstructive pulmonary disease, unspecified: Secondary | ICD-10-CM | POA: Insufficient documentation

## 2017-12-18 DIAGNOSIS — I255 Ischemic cardiomyopathy: Secondary | ICD-10-CM | POA: Insufficient documentation

## 2017-12-18 MED ORDER — AMITRIPTYLINE HCL 50 MG PO TABS: 50.0000 mg | ORAL_TABLET | Freq: Every day | ORAL | 0 refills | Status: DC

## 2017-12-18 NOTE — Progress Notes (Signed)
5/24/20191:51 PM  [name] 1946-01-28, 72 y.o. female [id number]  Chief Complaint  Patient presents with  . Medication Refill    medication chk and needs refill on the Amitriptyline 50mg     HPI:   Patient is a 72 y.o. female with past medical history significant for CAD, s/p ACID, CKD3, CHF, COPD, insomonia, migraines.and pre-diabetes who presents today for followup  Saw careds 12/07/17 - doing well. No changes in treatment, fu 6 months Coping with grief somewhat ok. Most of the time cant believe her son died She does have a good support system She is not interested in counseling She has not reached out to hospice She is sleeping ok, requesting refill of amitriptyline She is also due for mammogram and dexa Last mammo done in feb 2017, normal per patient, denies any breast complaint Last dexa done in feb 2017, unable to review results, past PCP's problem notes osteoporosis, patient denies taking bisphosphonate, does not recall results of last dexa She continues to be mindful of diet, last a1c 6.0 in nov 2018 She has no acute concerns today  Fall Risk  12/18/2017 11/21/2017 10/29/2017 09/18/2017 08/04/2017  Falls in the past year? No No No Yes No  Number falls in past yr: - - - 1 -  Injury with Fall? - - - No -  Follow up - - - Falls evaluation completed -     Depression screen [facility] 2/9 12/18/2017 11/21/2017 10/29/2017  Decreased Interest 0 0 (No Data)  Down, Depressed, Hopeless 0 0 -  PHQ - 2 Score 0 0 -  Some recent data might be hidden    Allergies  Allergen Reactions  . Lisinopril     cough  . Sulfa Antibiotics Other (See Comments)    Unknown, childhood allergy   . Sulfonamide Derivatives Other (See Comments)    UNSURE    Prior to Admission medications   Medication Sig Start Date End Date Taking? Authorizing Provider  albuterol (PROAIR HFA) 108 (90 Base) MCG/ACT inhaler Inhale 2 puffs into the lungs every 6 (six) hours as needed. For shortness of breath 09/18/17   Yes Rutherford Guys, MD  ALPRAZolam Duanne Moron) 0.25 MG tablet Take 1 tablet (0.25 mg total) by mouth 3 (three) times daily as needed for anxiety. 10/29/17  Yes Rutherford Guys, MD  amitriptyline (ELAVIL) 50 MG tablet Take 1 tablet (50 mg total) by mouth at bedtime. 10/08/17  Yes Rutherford Guys, MD  aspirin 81 MG tablet Take 81 mg by mouth every morning.    Yes [provider]  bisoprolol (ZEBETA) 5 MG tablet TAKE 1 TABLET (5 MG TOTAL) BY MOUTH DAILY. 12/14/17  Yes [name], MD  conjugated estrogens (PREMARIN) vaginal cream Apply 1 fingertip to outside of vagina twice a week 09/18/17  Yes Rutherford Guys, MD  DEXILANT 60 MG capsule TAKE 1 CAPSULE BY MOUTH DAILY. 11/11/17  Yes Armbruster, Carlota Raspberry, MD  dicyclomine (BENTYL) 10 MG capsule Take 1 capsule (10 mg total) by mouth every 8 (eight) hours as needed for spasms. 05/20/17  Yes Armbruster, Carlota Raspberry, MD  ENTRESTO 24-26 MG TAKE 1 TABLET BY MOUTH 2 TIMES DAILY. 10/09/17  Yes [name], MD  Fluticasone-Salmeterol (ADVAIR) 250-50 MCG/DOSE AEPB Inhale 1 puff into the lungs 2 (two) times daily. 12/08/14  Yes [provider]  furosemide (LASIX) 20 MG tablet TAKE 1 TABLET BY MOUTH DAILY. 12/25/16  Yes Allred, Jeneen Rinks, MD  ondansetron (ZOFRAN) 4 MG tablet Take 1 tablet (4  mg total) by mouth every 8 (eight) hours as needed for nausea or vomiting. 11/21/17  Yes Stallings, Zoe A, MD  polyethylene glycol powder (GLYCOLAX/MIRALAX) powder Take 17 g by mouth daily as needed for mild constipation. 07/08/17  Yes Rutherford Guys, MD  ranitidine (ZANTAC) 150 MG tablet TAKE 1 TABLET BY MOUTH 2 TIMES DAILY. 05/13/17  Yes Armbruster, Carlota Raspberry, MD  rosuvastatin (CRESTOR) 20 MG tablet TAKE 1 TABLET BY MOUTH AT BEDTIME. 06/30/17  Yes [name], MD  topiramate (TOPAMAX) 50 MG tablet Take 2 tablets (100 mg total) by mouth daily. 11/21/17  Yes Forrest Moron, MD    Past Medical History:  Diagnosis Date  . AICD (automatic  cardioverter/defibrillator) present    Dr.Allred follows  . Aortic arch pseudoaneurysm (HCC)    a. followed by Dr. Prescott Gum.  . Asthma   . Benign neoplasm of colon   . CAD (coronary artery disease) 02/2009   a. anterior STEMI rx with BMS to prox LAD in 02/2009. b. ISR s/p PTCA 06/2009. c. ISR s/p thrombectomy & PTCA 03/2010 due to late stent thrombosis.   . Chronic renal insufficiency   . Chronic systolic CHF (congestive heart failure) ([place])    a. s/p ST. Jude ICD 2013.  . Colon polyp   . COPD (chronic obstructive pulmonary disease) ([place])   . Diverticulosis   . GI bleed    a. h/o GIB on DAPT, now on ASA only.  Marland Kitchen Headache(784.0)    migraine  . Hearing loss    left ear  . Hyperlipemia   . Hypertension   . Irritable bowel syndrome   . Ischemic cardiomyopathy    EF 10-15%  . Memory loss   . Myocardial infarct [facility]) 2011 x 2   Dr. Roswell Nickel   . Pulmonary embolism ([place]) 2011  . Transfusion history    ?'12 or '13  . Unspecified mastoiditis     Past Surgical History:  Procedure Laterality Date  . ANGIOPLASTY  07/02/09, 04/01/10  . BILATERAL SALPINGOOPHORECTOMY    . CAD( bare metal stent)  02/2009   x 1  . CERVICAL SPINE SURGERY  08/08  . COLONOSCOPY WITH PROPOFOL N/A 04/29/2016   Procedure: COLONOSCOPY WITH PROPOFOL;  Surgeon: [name], MD;  [facility];  Service: Gastroenterology;  Laterality: N/A;  . ESOPHAGOGASTRODUODENOSCOPY N/A 12/09/2016   Procedure: ESOPHAGOGASTRODUODENOSCOPY (EGD);  Surgeon: [name], MD;  [facility];  Service: Gastroenterology;  Laterality: N/A;  . ESOPHAGOGASTRODUODENOSCOPY (EGD) WITH PROPOFOL N/A 04/29/2016   Procedure: ESOPHAGOGASTRODUODENOSCOPY (EGD) WITH PROPOFOL;  Surgeon: [name], MD;  [facility];  Service: Gastroenterology;  Laterality: N/A;  . EUS N/A 05/15/2016   Procedure: UPPER ENDOSCOPIC ULTRASOUND (EUS) RADIAL;  Surgeon: Milus Banister, MD;  [facility];  Service: Endoscopy;  Laterality: N/A;  . IMPLANTABLE CARDIOVERTER DEFIBRILLATOR IMPLANT N/A 08/05/2011   Primary prevention SJM ICD implanted,  Analyze ST study patient  . INNER EAR SURGERY     left x 17  . LUMBAR DISC SURGERY  02/2008   fusion  . TOTAL ABDOMINAL HYSTERECTOMY     complete    Social History   Tobacco Use  . Smoking status: Former Smoker    Packs/day: 0.25    Types: Cigarettes    Last attempt to quit: 04/27/2009    Years since quitting: 8.6  . Smokeless tobacco: Never Used  Substance Use Topics  . Alcohol use: No    Alcohol/week: 0.0 oz  Family History  Problem Relation Age of Onset  . Colon cancer Mother   . Colon cancer Brother   . Bladder Cancer Brother   . Breast cancer Cousin   . Cancer Son     Review of Systems  Constitutional: Negative for chills and fever.  Respiratory: Negative for cough and shortness of breath.   Cardiovascular: Negative for chest pain, palpitations and leg swelling.  Gastrointestinal: Negative for abdominal pain, nausea and vomiting.     OBJECTIVE:  Blood pressure 140/70, pulse 100, temperature 99 F (37.2 C), temperature source Oral, height 5' 3.39" (1.61 m), weight 171 lb (77.6 kg), SpO2 96 %.  Physical Exam  Constitutional: She is oriented to person, place, and time. She appears well-developed and well-nourished.  HENT:  Head: Normocephalic and atraumatic.  Mouth/Throat: Oropharynx is clear and moist. No oropharyngeal exudate.  Eyes: Pupils are equal, round, and reactive to light. EOM are normal. No scleral icterus.  Neck: Neck supple.  Cardiovascular: Normal rate, regular rhythm and normal heart sounds. Exam reveals no gallop and no friction rub.  No murmur heard. Pulmonary/Chest: Effort normal and breath sounds normal. She has no wheezes. She has no rales.  Musculoskeletal: She exhibits no edema.  Neurological: She is alert and oriented to person, place, and time.  Skin: Skin is warm and dry.    Psychiatric: She has a normal mood and affect.  Nursing note and vitals reviewed.   ASSESSMENT and PLAN  1. Grief at loss of child Struggling with coping.  Does have a good support system. Encouraged her to reach out to either hospice or other services for grief counseling. Contact info provided. RTC precautions discussed.  2. Pre-diabetes Checking a1c today. Cont with LFM - Hemoglobin A1c  3. Essential hypertension At goal, cont with current regime.  - Comprehensive metabolic panel  4. Function kidney decreased Monitoring.  Checking labs today. - Comprehensive metabolic panel  5. Insomnia, unspecified type Patient has been on this medication for many years. Provides good symptomatic relief and also helps with mood. Normal QTc on EKG done in April 2019. Med r/se/b reviewed. - amitriptyline (ELAVIL) 50 MG tablet; Take 1 tablet (50 mg total) by mouth at bedtime.  6. Estrogen deficiency - DG Bone Density; Future  7. Visit for screening mammogram - MM DIGITAL SCREENING BILATERAL; Future  Return in about 3 months (around 03/20/2018).    Rutherford Guys, MD [facility], [place] [address] Ph.  [phone] Fax [phone]

## 2017-12-18 NOTE — Patient Instructions (Addendum)
I encourage you to consider grief counseling. Here are some resources:   [employer], [phone]  [facility] [place], [phone]  [facility], [phone]    IF you received an x-ray today, you will receive an invoice from [facility]. Please contact [facility] at [phone] with questions or concerns regarding your invoice.   IF you received labwork today, you will receive an invoice from [place]. Please contact [facility] at [phone] with questions or concerns regarding your invoice.   Our billing staff will not be able to assist you with questions regarding bills from these companies.  You will be contacted with the lab results as soon as they are available. The fastest way to get your results is to activate your My Chart account. Instructions are located on the last page of this paperwork. If you have not heard from [place] regarding the results in 2 weeks, please contact this office.    Insomnia Insomnia is a sleep disorder that makes it difficult to fall asleep or to stay asleep. Insomnia can cause tiredness (fatigue), low energy, difficulty concentrating, mood swings, and poor performance at work or school. There are three different ways to classify insomnia:  Difficulty falling asleep.  Difficulty staying asleep.  Waking up too early in the morning.  Any type of insomnia can be long-term (chronic) or short-term (acute). Both are common. Short-term insomnia usually lasts for three months or less. Chronic insomnia occurs at least three times a week for longer than three months. What are the causes? Insomnia may be caused by another condition, situation, or substance, such as:  Anxiety.  Certain medicines.  Gastroesophageal reflux disease (GERD) or other gastrointestinal conditions.  Asthma or other breathing conditions.  Restless legs syndrome, sleep apnea, or other sleep disorders.  Chronic pain.  Menopause. This may  include hot flashes.  Stroke.  Abuse of alcohol, tobacco, or illegal drugs.  Depression.  Caffeine.  Neurological disorders, such as Alzheimer disease.  An overactive thyroid (hyperthyroidism).  The cause of insomnia may not be known. What increases the risk? Risk factors for insomnia include:  Gender. Women are more commonly affected than men.  Age. Insomnia is more common as you get older.  Stress. This may involve your professional or personal life.  Income. Insomnia is more common in people with lower income.  Lack of exercise.  Irregular work schedule or night shifts.  Traveling between different time zones.  What are the signs or symptoms? If you have insomnia, trouble falling asleep or trouble staying asleep is the main symptom. This may lead to other symptoms, such as:  Feeling fatigued.  Feeling nervous about going to sleep.  Not feeling rested in the morning.  Having trouble concentrating.  Feeling irritable, anxious, or depressed.  How is this treated? Treatment for insomnia depends on the cause. If your insomnia is caused by an underlying condition, treatment will focus on addressing the condition. Treatment may also include:  Medicines to help you sleep.  Counseling or therapy.  Lifestyle adjustments.  Follow these instructions at home:  Take medicines only as directed by your health care provider.  Keep regular sleeping and waking hours. Avoid naps.  Keep a sleep diary to help you and your health care provider figure out what could be causing your insomnia. Include: ? When you sleep. ? When you wake up during the night. ? How well you sleep. ? How rested you feel the next day. ? Any side effects of medicines you  are taking. ? What you eat and drink.  Make your bedroom a comfortable place where it is easy to fall asleep: ? Put up shades or special blackout curtains to block light from outside. ? Use a white noise machine to block  noise. ? Keep the temperature cool.  Exercise regularly as directed by your health care provider. Avoid exercising right before bedtime.  Use relaxation techniques to manage stress. Ask your health care provider to suggest some techniques that may work well for you. These may include: ? Breathing exercises. ? Routines to release muscle tension. ? Visualizing peaceful scenes.  Cut back on alcohol, caffeinated beverages, and cigarettes, especially close to bedtime. These can disrupt your sleep.  Do not overeat or eat spicy foods right before bedtime. This can lead to digestive discomfort that can make it hard for you to sleep.  Limit screen use before bedtime. This includes: ? Watching TV. ? Using your smartphone, tablet, and computer.  Stick to a routine. This can help you fall asleep faster. Try to do a quiet activity, brush your teeth, and go to bed at the same time each night.  Get out of bed if you are still awake after 15 minutes of trying to sleep. Keep the lights down, but try reading or doing a quiet activity. When you feel sleepy, go back to bed.  Make sure that you drive carefully. Avoid driving if you feel very sleepy.  Keep all follow-up appointments as directed by your health care provider. This is important. Contact a health care provider if:  You are tired throughout the day or have trouble in your daily routine due to sleepiness.  You continue to have sleep problems or your sleep problems get worse. Get help right away if:  You have serious thoughts about hurting yourself or someone else. This information is not intended to replace advice given to you by your health care provider. Make sure you discuss any questions you have with your health care provider. Document Released: 07/11/2000 Document Revised: 12/14/2015 Document Reviewed: 04/14/2014 Elsevier Interactive Patient Education  [employer].

## 2017-12-19 LAB — HEMOGLOBIN A1C
Est. average glucose Bld gHb Est-mCnc: 120 mg/dL
Hgb A1c MFr Bld: 5.8 % — ABNORMAL HIGH (ref 4.8–5.6)

## 2017-12-19 LAB — COMPREHENSIVE METABOLIC PANEL
ALT: 12 IU/L (ref 0–32)
AST: 15 IU/L (ref 0–40)
Albumin/Globulin Ratio: 1.6 (ref 1.2–2.2)
Albumin: 4.4 g/dL (ref 3.5–4.8)
Alkaline Phosphatase: 106 IU/L (ref 39–117)
BUN/Creatinine Ratio: 16 (ref 12–28)
BUN: 23 mg/dL (ref 8–27)
Bilirubin Total: 0.3 mg/dL (ref 0.0–1.2)
CO2: 18 mmol/L — ABNORMAL LOW (ref 20–29)
Calcium: 9.3 mg/dL (ref 8.7–10.3)
Chloride: 111 mmol/L — ABNORMAL HIGH (ref 96–106)
Creatinine, Ser: 1.45 mg/dL — ABNORMAL HIGH (ref 0.57–1.00)
GFR calc Af Amer: 42 mL/min/{1.73_m2} — ABNORMAL LOW (ref >59)
GFR calc non Af Amer: 37 mL/min/{1.73_m2} — ABNORMAL LOW (ref >59)
Globulin, Total: 2.7 g/dL (ref 1.5–4.5)
Glucose: 98 mg/dL (ref 65–99)
Potassium: 4.6 mmol/L (ref 3.5–5.2)
Sodium: 145 mmol/L — ABNORMAL HIGH (ref 134–144)
Total Protein: 7.1 g/dL (ref 6.0–8.5)

## 2017-12-28 ENCOUNTER — Encounter: Payer: Self-pay | Admitting: Internal Medicine

## 2017-12-28 ENCOUNTER — Ambulatory Visit (INDEPENDENT_AMBULATORY_CARE_PROVIDER_SITE_OTHER): Payer: Medicare Other | Admitting: Internal Medicine

## 2017-12-28 VITALS — BP 126/76 | HR 71 | Ht 63.39 in | Wt 171.0 lb

## 2017-12-28 DIAGNOSIS — I5022 Chronic systolic (congestive) heart failure: Secondary | ICD-10-CM | POA: Diagnosis not present

## 2017-12-28 DIAGNOSIS — I255 Ischemic cardiomyopathy: Secondary | ICD-10-CM

## 2017-12-28 DIAGNOSIS — Z95810 Presence of automatic (implantable) cardiac defibrillator: Secondary | ICD-10-CM | POA: Diagnosis not present

## 2017-12-28 NOTE — Patient Instructions (Addendum)
Medication Instructions:  Your physician recommends that you continue on your current medications as directed. Please refer to the Current Medication list given to you today.  Labwork: None ordered.  Testing/Procedures: None ordered.  Follow-Up: Your physician wants you to follow-up in: one year with Tammy Marshall, NP.  You will receive a reminder letter in the mail two months in advance. If you don't receive a letter, please call our office to schedule the follow-up appointment.  Remote monitoring is used to monitor your ICD from home. This monitoring reduces the number of office visits required to check your device to one time per year. It allows [place] to keep an eye on the functioning of your device to ensure it is working properly. You are scheduled for a device check from home on 02/15/2018. You may send your transmission at any time that day. If you have a wireless device, the transmission will be sent automatically. After your physician reviews your transmission, you will receive a postcard with your next transmission date.  Any Other Special Instructions Will Be Listed Below (If Applicable).  If you need a refill on your cardiac medications before your next appointment, please call your pharmacy.

## 2017-12-28 NOTE — Progress Notes (Signed)
PCP: Rutherford Guys, MD Primary Cardiologist: Dr Burt Knack (previously Dr [name]) Primary EP: Dr Rayann Heman  [name] is a 72 y.o. female who presents today for routine electrophysiology followup.  Since last being seen in our clinic, the patient reports doing very well.  Today, she denies symptoms of palpitations, chest pain, shortness of breath,  lower extremity edema, dizziness, presyncope, syncope, or ICD shocks.  The patient is otherwise without complaint today.   Past Medical History:  Diagnosis Date  . AICD (automatic cardioverter/defibrillator) present    Dr.Shenay Torti follows  . Aortic arch pseudoaneurysm (HCC)    a. followed by Dr. Prescott Gum.  . Asthma   . Benign neoplasm of colon   . CAD (coronary artery disease) 02/2009   a. anterior STEMI rx with BMS to prox LAD in 02/2009. b. ISR s/p PTCA 06/2009. c. ISR s/p thrombectomy & PTCA 03/2010 due to late stent thrombosis.   . Chronic renal insufficiency   . Chronic systolic CHF (congestive heart failure) ([place])    a. s/p ST. Jude ICD 2013.  . Colon polyp   . COPD (chronic obstructive pulmonary disease) ([place])   . Diverticulosis   . GI bleed    a. h/o GIB on DAPT, now on ASA only.  Marland Kitchen Hearing loss    left ear  . Hyperlipemia   . Hypertension   . Insomnia   . Irritable bowel syndrome   . Ischemic cardiomyopathy    EF 10-15%  . Memory loss   . Migraine headache without aura   . Myocardial infarct [facility]) 2011 x 2   Dr. Roswell Nickel   . Pulmonary embolism ([place]) 2011  . Transfusion history    ?'12 or '13  . Unspecified mastoiditis    Past Surgical History:  Procedure Laterality Date  . ANGIOPLASTY  07/02/09, 04/01/10  . BILATERAL SALPINGOOPHORECTOMY    . CAD( bare metal stent)  02/2009   x 1  . CERVICAL SPINE SURGERY  08/08  . COLONOSCOPY WITH PROPOFOL N/A 04/29/2016   Procedure: COLONOSCOPY WITH PROPOFOL;  Surgeon: [name], MD;  Location: [facility];  Service: Gastroenterology;  Laterality: N/A;  .  ESOPHAGOGASTRODUODENOSCOPY N/A 12/09/2016   Procedure: ESOPHAGOGASTRODUODENOSCOPY (EGD);  Surgeon: [name], MD;  Location: [facility];  Service: Gastroenterology;  Laterality: N/A;  . ESOPHAGOGASTRODUODENOSCOPY (EGD) WITH PROPOFOL N/A 04/29/2016   Procedure: ESOPHAGOGASTRODUODENOSCOPY (EGD) WITH PROPOFOL;  Surgeon: [name], MD;  Location: [facility];  Service: Gastroenterology;  Laterality: N/A;  . EUS N/A 05/15/2016   Procedure: UPPER ENDOSCOPIC ULTRASOUND (EUS) RADIAL;  Surgeon: Milus Banister, MD;  Location: [facility];  Service: Endoscopy;  Laterality: N/A;  . IMPLANTABLE CARDIOVERTER DEFIBRILLATOR IMPLANT N/A 08/05/2011   Primary prevention SJM ICD implanted,  Analyze ST study patient  . INNER EAR SURGERY     left x 17  . LUMBAR DISC SURGERY  02/2008   fusion  . TOTAL ABDOMINAL HYSTERECTOMY     complete    ROS- all systems are reviewed and negative except as per HPI above  Current Outpatient Medications  Medication Sig Dispense Refill  . albuterol (PROAIR HFA) 108 (90 Base) MCG/ACT inhaler Inhale 2 puffs into the lungs every 6 (six) hours as needed. For shortness of breath 18 g 5  . amitriptyline (ELAVIL) 50 MG tablet Take 1 tablet (50 mg total) by mouth at bedtime. 90 tablet 0  . aspirin 81 MG tablet Take 81 mg by mouth every morning.     . bisoprolol (  ZEBETA) 5 MG tablet TAKE 1 TABLET (5 MG TOTAL) BY MOUTH DAILY. 90 tablet 3  . conjugated estrogens (PREMARIN) vaginal cream Apply 1 fingertip to outside of vagina twice a week 30 g 12  . DEXILANT 60 MG capsule TAKE 1 CAPSULE BY MOUTH DAILY. 30 capsule 3  . dicyclomine (BENTYL) 10 MG capsule Take 1 capsule (10 mg total) by mouth every 8 (eight) hours as needed for spasms. 30 capsule 3  . ENTRESTO 24-26 MG TAKE 1 TABLET BY MOUTH 2 TIMES DAILY. 180 tablet 1  . Fluticasone-Salmeterol (ADVAIR) 250-50 MCG/DOSE AEPB Inhale 1 puff into the lungs 2 (two) times daily.    . furosemide (LASIX) 20 MG tablet TAKE  1 TABLET BY MOUTH DAILY. 30 tablet 10  . ondansetron (ZOFRAN) 4 MG tablet Take 1 tablet (4 mg total) by mouth every 8 (eight) hours as needed for nausea or vomiting. 20 tablet 0  . polyethylene glycol powder (GLYCOLAX/MIRALAX) powder Take 17 g by mouth daily as needed for mild constipation. 255 g 11  . ranitidine (ZANTAC) 150 MG tablet TAKE 1 TABLET BY MOUTH 2 TIMES DAILY. 180 tablet 0  . rosuvastatin (CRESTOR) 20 MG tablet TAKE 1 TABLET BY MOUTH AT BEDTIME. 90 tablet 3  . topiramate (TOPAMAX) 50 MG tablet Take 2 tablets (100 mg total) by mouth daily. 90 tablet 0   No current facility-administered medications for this visit.     Physical Exam: Vitals:   12/28/17 1353  BP: 126/76  Pulse: 71  Weight: 171 lb (77.6 kg)  Height: 5' 3.39" (1.61 m)    GEN- The patient is well appearing, alert and oriented x 3 today.   Head- normocephalic, atraumatic Eyes-  Sclera clear, conjunctiva pink Ears- hearing intact Oropharynx- clear Lungs- Clear to ausculation bilaterally, normal work of breathing Chest- ICD pocket is well healed Heart- Regular rate and rhythm, no murmurs, rubs or gallops, PMI not laterally displaced GI- soft, NT, ND, + BS Extremities- no clubbing, cyanosis, or edema  ICD interrogation- reviewed in detail today,  See PACEART report  ekg tracing ordered today is personally reviewed and shows sinus rhythm 71 bpm, PR 212 msec, LAHB, QRS 116 msec)  Wt Readings from Last 3 Encounters:  12/28/17 171 lb (77.6 kg)  12/18/17 171 lb (77.6 kg)  12/07/17 174 lb 6.4 oz (79.1 kg)    Assessment and Plan:  1.  Chronic systolic dysfunction/ ischemic CM/ CAD euvolemic today Stable on an appropriate medical regimen Normal ICD function See Pace Art report No changes today  2. Stage III CRI Stable No change required today  [place] Return to see EP NP annually Follow-up with Dr Burt Knack as scheduled  Thompson Grayer MD, [facility] 12/28/2017 2:17 PM

## 2018-01-05 ENCOUNTER — Encounter: Payer: Self-pay | Admitting: Family Medicine

## 2018-01-05 ENCOUNTER — Encounter: Payer: Medicare Other | Admitting: *Deleted

## 2018-01-05 VITALS — BP 121/62 | HR 70 | Wt 172.0 lb

## 2018-01-05 DIAGNOSIS — Z006 Encounter for examination for normal comparison and control in clinical research program: Secondary | ICD-10-CM

## 2018-01-06 ENCOUNTER — Encounter: Payer: Self-pay | Admitting: Family Medicine

## 2018-01-12 ENCOUNTER — Other Ambulatory Visit: Payer: Self-pay | Admitting: Internal Medicine

## 2018-01-15 NOTE — Progress Notes (Signed)
Beat HF 15 month medical management  Pt came in today doing well. No complaints of cp, sob, or swelling. No med changes. Pt re-consented to BEAT study.  Subject met inclusion and exclusion criteria. The informed consent form, study requirements and expectation were reviewed with the subject and questions and concerns were addressed prior to the signing of the consent form. The subject verbalized understanding of the trial requirements. The subject agreed to participate in the BEAT HF trial and singed the informed consent. The informed consent was obtained prior to performance of any protocol-specific procedure for the subject. A copy of the signed informed consent was given to the subject and a copy was placed in the subject's medical record.  Subject re-consented  Rev G v 0.1 Local IRB approved 11/06/2017   Current Outpatient Medications:  .  albuterol (PROAIR HFA) 108 (90 Base) MCG/ACT inhaler, Inhale 2 puffs into the lungs every 6 (six) hours as needed. For shortness of breath, Disp: 18 g, Rfl: 5 .  amitriptyline (ELAVIL) 50 MG tablet, Take 1 tablet (50 mg total) by mouth at bedtime., Disp: 90 tablet, Rfl: 0 .  aspirin 81 MG tablet, Take 81 mg by mouth every morning. , Disp: , Rfl:  .  bisoprolol (ZEBETA) 5 MG tablet, TAKE 1 TABLET (5 MG TOTAL) BY MOUTH DAILY., Disp: 90 tablet, Rfl: 3 .  conjugated estrogens (PREMARIN) vaginal cream, Apply 1 fingertip to outside of vagina twice a week, Disp: 30 g, Rfl: 12 .  DEXILANT 60 MG capsule, TAKE 1 CAPSULE BY MOUTH DAILY., Disp: 30 capsule, Rfl: 3 .  dicyclomine (BENTYL) 10 MG capsule, Take 1 capsule (10 mg total) by mouth every 8 (eight) hours as needed for spasms., Disp: 30 capsule, Rfl: 3 .  ENTRESTO 24-26 MG, TAKE 1 TABLET BY MOUTH 2 TIMES DAILY., Disp: 180 tablet, Rfl: 1 .  Fluticasone-Salmeterol (ADVAIR) 250-50 MCG/DOSE AEPB, Inhale 1 puff into the lungs 2 (two) times daily., Disp: , Rfl:  .  furosemide (LASIX) 20 MG tablet, TAKE 1 TABLET BY MOUTH  DAILY., Disp: 30 tablet, Rfl: 11 .  ondansetron (ZOFRAN) 4 MG tablet, Take 1 tablet (4 mg total) by mouth every 8 (eight) hours as needed for nausea or vomiting., Disp: 20 tablet, Rfl: 0 .  polyethylene glycol powder (GLYCOLAX/MIRALAX) powder, Take 17 g by mouth daily as needed for mild constipation., Disp: 255 g, Rfl: 11 .  ranitidine (ZANTAC) 150 MG tablet, TAKE 1 TABLET BY MOUTH 2 TIMES DAILY., Disp: 180 tablet, Rfl: 0 .  rosuvastatin (CRESTOR) 20 MG tablet, TAKE 1 TABLET BY MOUTH AT BEDTIME., Disp: 90 tablet, Rfl: 3 .  topiramate (TOPAMAX) 50 MG tablet, Take 2 tablets (100 mg total) by mouth daily., Disp: 90 tablet, Rfl: 0

## 2018-01-20 ENCOUNTER — Other Ambulatory Visit: Payer: Medicare Other

## 2018-01-20 ENCOUNTER — Ambulatory Visit (INDEPENDENT_AMBULATORY_CARE_PROVIDER_SITE_OTHER): Payer: Medicare Other | Admitting: Cardiothoracic Surgery

## 2018-01-20 ENCOUNTER — Encounter: Payer: Self-pay | Admitting: Cardiothoracic Surgery

## 2018-01-20 ENCOUNTER — Ambulatory Visit
Admission: RE | Admit: 2018-01-20 | Discharge: 2018-01-20 | Disposition: A | Discharge status: Home / Self Care | Payer: Medicare Other | Source: Ambulatory Visit | Attending: Cardiothoracic Surgery | Admitting: Cardiothoracic Surgery

## 2018-01-20 VITALS — BP 119/81 | HR 74 | Resp 20 | Ht 63.0 in | Wt 173.0 lb

## 2018-01-20 DIAGNOSIS — I712 Thoracic aortic aneurysm, without rupture, unspecified: Secondary | ICD-10-CM

## 2018-01-20 DIAGNOSIS — I719 Aortic aneurysm of unspecified site, without rupture: Secondary | ICD-10-CM

## 2018-01-20 NOTE — Progress Notes (Signed)
PCP is Rutherford Guys, MD Referring Provider is Leamon Arnt, MD  Chief Complaint  Patient presents with  . Thoracic Aortic Aneurysm    1 year f/u with Chest CT    HPI: Patient returns for one-year follow-up with CTA of the thoracic aorta for a small pseudoaneurysm of the proximal descending thoracic aorta.  This has been minimal and stable for several years however last year the pseudoaneurysm showed increase in size from 1.6cm to 2.2 cm.  She remains asymptomatic.  Follow-up scan today shows the aneurysm to remain at 2.5 cm with surrounding calcification.  It is well distal to the left subclavian origin.  Earlier this year the patient's son died of cancer and she became very distraught and depressed and stopped taking her medications including her blood pressure medication and cardiac medications.  She has history of ischemic cardiomyopathy with EF of 25% and history of previous PCI of the LAD by Dr. Burt Knack.  Patient has now somewhat recovered and is taking her medications and maintaining adequate diet.  The patient denies chest or upper back pain.  Because the pseudoaneurysm appears to have shown significant change she will be referred to the aortic center for evaluation of possible percutaneousTEVAR stent graft therapy.  Past Medical History:  Diagnosis Date  . AICD (automatic cardioverter/defibrillator) present    Dr.Allred follows  . Aortic arch pseudoaneurysm (HCC)    a. followed by Dr. Prescott Gum.  . Asthma   . Benign neoplasm of colon   . CAD (coronary artery disease) 02/2009   a. anterior STEMI rx with BMS to prox LAD in 02/2009. b. ISR s/p PTCA 06/2009. c. ISR s/p thrombectomy & PTCA 03/2010 due to late stent thrombosis.   . Chronic renal insufficiency   . Chronic systolic CHF (congestive heart failure) ([place])    a. s/p ST. Jude ICD 2013.  . Colon polyp   . COPD (chronic obstructive pulmonary disease) ([place])   . Diverticulosis   . GI bleed    a. h/o GIB on DAPT, now on ASA  only.  Marland Kitchen Hearing loss    left ear  . Hyperlipemia   . Hypertension   . Insomnia   . Irritable bowel syndrome   . Ischemic cardiomyopathy    EF 10-15%  . Memory loss   . Migraine headache without aura   . Myocardial infarct [facility]) 2011 x 2   Dr. Roswell Nickel   . Pulmonary embolism ([place]) 2011  . Transfusion history    ?'12 or '13  . Unspecified mastoiditis     Past Surgical History:  Procedure Laterality Date  . ANGIOPLASTY  07/02/09, 04/01/10  . BILATERAL SALPINGOOPHORECTOMY    . CAD( bare metal stent)  02/2009   x 1  . CERVICAL SPINE SURGERY  08/08  . COLONOSCOPY WITH PROPOFOL N/A 04/29/2016   Procedure: COLONOSCOPY WITH PROPOFOL;  Surgeon: [name], MD;  Location: [facility];  Service: Gastroenterology;  Laterality: N/A;  . ESOPHAGOGASTRODUODENOSCOPY N/A 12/09/2016   Procedure: ESOPHAGOGASTRODUODENOSCOPY (EGD);  Surgeon: [name], MD;  Location: [facility];  Service: Gastroenterology;  Laterality: N/A;  . ESOPHAGOGASTRODUODENOSCOPY (EGD) WITH PROPOFOL N/A 04/29/2016   Procedure: ESOPHAGOGASTRODUODENOSCOPY (EGD) WITH PROPOFOL;  Surgeon: [name], MD;  [facility]: [facility];  Service: Gastroenterology;  Laterality: N/A;  . EUS N/A 05/15/2016   Procedure: UPPER ENDOSCOPIC ULTRASOUND (EUS) RADIAL;  Surgeon: Milus Banister, MD;  Location: [facility];  Service: Endoscopy;  Laterality: N/A;  . IMPLANTABLE CARDIOVERTER DEFIBRILLATOR IMPLANT N/A  08/05/2011   Primary prevention SJM ICD implanted,  Analyze ST study patient  . INNER EAR SURGERY     left x 17  . LUMBAR DISC SURGERY  02/2008   fusion  . TOTAL ABDOMINAL HYSTERECTOMY     complete    Family History  Problem Relation Age of Onset  . Colon cancer Mother   . Colon cancer Brother   . Bladder Cancer Brother   . Breast cancer Cousin   . Cancer Son     Social History Social History   Tobacco Use  . Smoking status: Former Smoker    Packs/day: 0.25    Types: Cigarettes     Last attempt to quit: 04/27/2009    Years since quitting: 8.7  . Smokeless tobacco: Never Used  Substance Use Topics  . Alcohol use: No    Alcohol/week: 0.0 oz  . Drug use: No    Current Outpatient Medications  Medication Sig Dispense Refill  . albuterol (PROAIR HFA) 108 (90 Base) MCG/ACT inhaler Inhale 2 puffs into the lungs every 6 (six) hours as needed. For shortness of breath 18 g 5  . amitriptyline (ELAVIL) 50 MG tablet Take 1 tablet (50 mg total) by mouth at bedtime. 90 tablet 0  . aspirin 81 MG tablet Take 81 mg by mouth every morning.     . bisoprolol (ZEBETA) 5 MG tablet TAKE 1 TABLET (5 MG TOTAL) BY MOUTH DAILY. 90 tablet 3  . conjugated estrogens (PREMARIN) vaginal cream Apply 1 fingertip to outside of vagina twice a week 30 g 12  . DEXILANT 60 MG capsule TAKE 1 CAPSULE BY MOUTH DAILY. 30 capsule 3  . dicyclomine (BENTYL) 10 MG capsule Take 1 capsule (10 mg total) by mouth every 8 (eight) hours as needed for spasms. 30 capsule 3  . ENTRESTO 24-26 MG TAKE 1 TABLET BY MOUTH 2 TIMES DAILY. 180 tablet 1  . Fluticasone-Salmeterol (ADVAIR) 250-50 MCG/DOSE AEPB Inhale 1 puff into the lungs 2 (two) times daily.    . furosemide (LASIX) 20 MG tablet TAKE 1 TABLET BY MOUTH DAILY. 30 tablet 11  . ondansetron (ZOFRAN) 4 MG tablet Take 1 tablet (4 mg total) by mouth every 8 (eight) hours as needed for nausea or vomiting. 20 tablet 0  . polyethylene glycol powder (GLYCOLAX/MIRALAX) powder Take 17 g by mouth daily as needed for mild constipation. 255 g 11  . ranitidine (ZANTAC) 150 MG tablet TAKE 1 TABLET BY MOUTH 2 TIMES DAILY. 180 tablet 0  . rosuvastatin (CRESTOR) 20 MG tablet TAKE 1 TABLET BY MOUTH AT BEDTIME. 90 tablet 3  . topiramate (TOPAMAX) 50 MG tablet Take 2 tablets (100 mg total) by mouth daily. 90 tablet 0   No current facility-administered medications for this visit.     Allergies  Allergen Reactions  . Lisinopril     cough  . Sulfa Antibiotics Other (See Comments)     Unknown, childhood allergy   . Sulfonamide Derivatives Other (See Comments)    UNSURE    Review of Systems  Weight stable No shortness of breath No syncope or dizziness No active dental complaints No chest pain or upper back pain No abdominal pain or change in bowel habits No lower extremity edema  no productive cough   BP 119/81   Pulse 74   Resp 20   Ht 5\' 3"  (1.6 m)   Wt 173 lb (78.5 kg)   SpO2 96% Comment: RA  BMI 30.65 kg/m  Physical Exam  Exam    General- alert and comfortable    Neck- no JVD, no cervical adenopathy palpable, no carotid bruit   Lungs- clear without rales, wheezes   Cor- regular rate and rhythm, no murmur , gallop   Abdomen- soft, non-tender   Extremities - warm, non-tender, minimal edema   Neuro- oriented, appropriate, no focal weakness   Diagnostic Tests: CT scan images personally reviewed and counseled with patient showing slight increase in size of pseudoaneurysm of proximal descending thoracic aorta.  He remains asymptomatic.  Impression: Patient's blood pressure currently under good control and she is compliant with her medications.  I am concerned over the increased size of the pseudoaneurysm over the past 1 to 2 years and she will be referred for evaluation of possible stent graft therapy by the [facility].  Plan: Return for follow-up and to check her status in approximately 2 months.   Len Childs, MD [facility] [phone]

## 2018-02-15 ENCOUNTER — Ambulatory Visit (INDEPENDENT_AMBULATORY_CARE_PROVIDER_SITE_OTHER): Payer: Medicare Other | Admitting: *Deleted

## 2018-02-15 DIAGNOSIS — I255 Ischemic cardiomyopathy: Secondary | ICD-10-CM | POA: Diagnosis not present

## 2018-02-15 NOTE — Progress Notes (Signed)
Remote ICD transmission.   

## 2018-02-16 ENCOUNTER — Encounter: Payer: Self-pay | Admitting: Cardiology

## 2018-03-01 ENCOUNTER — Other Ambulatory Visit: Payer: Self-pay

## 2018-03-01 ENCOUNTER — Encounter: Payer: Self-pay | Admitting: Surgery

## 2018-03-01 ENCOUNTER — Ambulatory Visit (INDEPENDENT_AMBULATORY_CARE_PROVIDER_SITE_OTHER): Payer: Medicare Other | Admitting: Surgery

## 2018-03-01 VITALS — BP 116/77 | HR 74 | Temp 97.9°F | Resp 20 | Ht 63.0 in | Wt 174.0 lb

## 2018-03-01 DIAGNOSIS — I700 Atherosclerosis of aorta: Principal | ICD-10-CM

## 2018-03-01 DIAGNOSIS — I719 Aortic aneurysm of unspecified site, without rupture: Secondary | ICD-10-CM

## 2018-03-01 NOTE — Progress Notes (Signed)
[facility] [place]  Patient name: [name] MRN: [id number] DOB: 1946-01-03 Sex: female   REQUESTING PROVIDER:    Dr. Darcey Nora   REASON FOR CONSULT:    PAU  HISTORY OF PRESENT ILLNESS:   [name] is a 72 y.o. female, who is referred for an enlarging thoracic aortic penetrating ulcer.  She is pain-free.  The patient has a history of ischemic cardiomyopathy.  Her ejection fraction is around 25%.  I had evaluated her for possible Barostim therapy in the past.  She has a history of PCI in the past.  She is chest pain-free.  She feels like she is doing very well.  She has an ICD in place.  She takes a statin for hypercholesterolemia.  She has stage III renal insufficiency.  PAST MEDICAL HISTORY    Past Medical History:  Diagnosis Date  . AICD (automatic cardioverter/defibrillator) present    Dr.Allred follows  . Aortic arch pseudoaneurysm (HCC)    a. followed by Dr. Prescott Gum.  . Asthma   . Benign neoplasm of colon   . CAD (coronary artery disease) 02/2009   a. anterior STEMI rx with BMS to prox LAD in 02/2009. b. ISR s/p PTCA 06/2009. c. ISR s/p thrombectomy & PTCA 03/2010 due to late stent thrombosis.   . Chronic renal insufficiency   . Chronic systolic CHF (congestive heart failure) ([place])    a. s/p ST. Jude ICD 2013.  . Colon polyp   . COPD (chronic obstructive pulmonary disease) ([place])   . Diverticulosis   . GI bleed    a. h/o GIB on DAPT, now on ASA only.  Marland Kitchen Hearing loss    left ear  . Hyperlipemia   . Hypertension   . Insomnia   . Irritable bowel syndrome   . Ischemic cardiomyopathy    EF 10-15%  . Memory loss   . Migraine headache without aura   . Myocardial infarct [facility]) 2011 x 2   Dr. [place] Nickel   . Pulmonary embolism ([place]) 2011  . Transfusion history    ?'12 or '13  . Unspecified mastoiditis      FAMILY HISTORY   Family History  Problem Relation Age of Onset  . Colon cancer  Mother   . Colon cancer Brother   . Bladder Cancer Brother   . Breast cancer Cousin   . Cancer Son     SOCIAL HISTORY:   Social History   Socioeconomic History  . Marital status: Divorced    Spouse name: Not on file  . Number of children: 2  . Years of education: 37  . Highest education level: Not on file  Occupational History  . Occupation: Merchandiser, retail: UNEMPLOYED    Employer: DISABLED  Social Needs  . Financial resource strain: Not on file  . Food insecurity:    Worry: Not on file    Inability: Not on file  . Transportation needs:    Medical: Not on file    Non-medical: Not on file  Tobacco Use  . Smoking status: Former Smoker    Packs/day: 0.25    Types: Cigarettes    Last attempt to quit: 04/27/2009    Years since quitting: 8.8  . Smokeless tobacco: Never Used  Substance and Sexual Activity  . Alcohol use: No    Alcohol/week: 0.0 oz  . Drug use: No  . Sexual activity: Not on file  Lifestyle  . Physical activity:  Days per week: Not on file    Minutes per session: Not on file  . Stress: Not on file  Relationships  . Social connections:    Talks on phone: Not on file    Gets together: Not on file    Attends religious service: Not on file    Active member of club or organization: Not on file    Attends meetings of clubs or organizations: Not on file    Relationship status: Not on file  . Intimate partner violence:    Fear of current or ex partner: Not on file    Emotionally abused: Not on file    Physically abused: Not on file    Forced sexual activity: Not on file  Other Topics Concern  . Not on file  Social History Narrative   Pt lives in [place] alone.  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   Patient has 11 th grade education.Right handed.   Caffeine- one cup daily          ALLERGIES:    Allergies  Allergen Reactions  . Lisinopril     cough  . Sulfa Antibiotics Other (See Comments)    Unknown, childhood allergy    . Sulfonamide Derivatives Other (See Comments)    UNSURE    CURRENT MEDICATIONS:    Current Outpatient Medications  Medication Sig Dispense Refill  . albuterol (PROAIR HFA) 108 (90 Base) MCG/ACT inhaler Inhale 2 puffs into the lungs every 6 (six) hours as needed. For shortness of breath 18 g 5  . amitriptyline (ELAVIL) 50 MG tablet Take 1 tablet (50 mg total) by mouth at bedtime. 90 tablet 0  . aspirin 81 MG tablet Take 81 mg by mouth every morning.     . bisoprolol (ZEBETA) 5 MG tablet TAKE 1 TABLET (5 MG TOTAL) BY MOUTH DAILY. 90 tablet 3  . conjugated estrogens (PREMARIN) vaginal cream Apply 1 fingertip to outside of vagina twice a week 30 g 12  . DEXILANT 60 MG capsule TAKE 1 CAPSULE BY MOUTH DAILY. 30 capsule 3  . dicyclomine (BENTYL) 10 MG capsule Take 1 capsule (10 mg total) by mouth every 8 (eight) hours as needed for spasms. 30 capsule 3  . ENTRESTO 24-26 MG TAKE 1 TABLET BY MOUTH 2 TIMES DAILY. 180 tablet 1  . Fluticasone-Salmeterol (ADVAIR) 250-50 MCG/DOSE AEPB Inhale 1 puff into the lungs 2 (two) times daily.    . furosemide (LASIX) 20 MG tablet TAKE 1 TABLET BY MOUTH DAILY. 30 tablet 11  . ondansetron (ZOFRAN) 4 MG tablet Take 1 tablet (4 mg total) by mouth every 8 (eight) hours as needed for nausea or vomiting. 20 tablet 0  . polyethylene glycol powder (GLYCOLAX/MIRALAX) powder Take 17 g by mouth daily as needed for mild constipation. 255 g 11  . ranitidine (ZANTAC) 150 MG tablet TAKE 1 TABLET BY MOUTH 2 TIMES DAILY. 180 tablet 0  . rosuvastatin (CRESTOR) 20 MG tablet TAKE 1 TABLET BY MOUTH AT BEDTIME. 90 tablet 3  . topiramate (TOPAMAX) 50 MG tablet Take 2 tablets (100 mg total) by mouth daily. 90 tablet 0   No current facility-administered medications for this visit.     REVIEW OF SYSTEMS:   [X]  denotes positive finding, [ ]  denotes negative finding Cardiac  Comments:  Chest pain or chest pressure:    Shortness of breath upon exertion: x   Short of breath when  lying flat: x   Irregular heart rhythm:  Vascular    Pain in calf, thigh, or hip brought on by ambulation:    Pain in feet at night that wakes you up from your sleep:     Blood clot in your veins:    Leg swelling:         Pulmonary    Oxygen at home:    Productive cough:     Wheezing:         Neurologic    Sudden weakness in arms or legs:     Sudden numbness in arms or legs:   Palpable left posterior tibial pulse.   Sudden onset of difficulty speaking or slurred speech:    Temporary loss of vision in one eye:     Problems with dizziness:  x       Gastrointestinal    Blood in stool:      Vomited blood:         Genitourinary    Burning when urinating:     Blood in urine:        Psychiatric    Major depression:         Hematologic    Bleeding problems:    Problems with blood clotting too easily:        Skin    Rashes or ulcers:        Constitutional    Fever or chills:     PHYSICAL EXAM:   Vitals:   03/01/18 0956  BP: 116/77  Pulse: 74  Resp: 20  Temp: 97.9 F (36.6 C)  TempSrc: Oral  SpO2: 95%  Weight: 174 lb (78.9 kg)  Height: 5\' 3"  (1.6 m)    GENERAL: The patient is a well-nourished female, in no acute distress. The vital signs are documented above. CARDIAC: There is a regular rate and rhythm.  VASCULAR: I could not palpate a right pedal pulse PULMONARY: Nonlabored respirations ABDOMEN: Soft and non-tender with normal pitched bowel sounds.  MUSCULOSKELETAL: There are no major deformities or cyanosis. NEUROLOGIC: No focal weakness or paresthesias are detected. SKIN: There are no ulcers or rashes noted. PSYCHIATRIC: The patient has a normal affect.  STUDIES:   I have reviewed her noncontrast CT scan of the chest which shows a 2.5 cm descending thoracic aortic ulcer  ASSESSMENT and PLAN   PAU: Based on the interval growth of her pseudoaneurysm, repair has been recommended.  I am going to get a CT angiogram with contrast to fully delineate  her anatomy as well as to evaluate her access vessels.  She will most likely need a carotid subclavian bypass.  She will have a CT scan within the next week or 2 and return for discussions regarding surgery.   Annamarie Major, MD [facility] [phone] Pager [phone]

## 2018-03-02 ENCOUNTER — Other Ambulatory Visit: Payer: Self-pay

## 2018-03-02 DIAGNOSIS — I719 Aortic aneurysm of unspecified site, without rupture: Secondary | ICD-10-CM

## 2018-03-02 DIAGNOSIS — I700 Atherosclerosis of aorta: Principal | ICD-10-CM

## 2018-03-12 ENCOUNTER — Ambulatory Visit
Admission: RE | Admit: 2018-03-12 | Discharge: 2018-03-12 | Disposition: A | Discharge status: Home / Self Care | Payer: Medicare Other | Source: Ambulatory Visit | Attending: Surgery | Admitting: Surgery

## 2018-03-12 ENCOUNTER — Other Ambulatory Visit: Payer: Self-pay

## 2018-03-12 DIAGNOSIS — I739 Peripheral vascular disease, unspecified: Secondary | ICD-10-CM

## 2018-03-12 DIAGNOSIS — I719 Aortic aneurysm of unspecified site, without rupture: Secondary | ICD-10-CM

## 2018-03-12 DIAGNOSIS — I700 Atherosclerosis of aorta: Principal | ICD-10-CM

## 2018-03-12 MED ORDER — IOPAMIDOL (ISOVUE-370) INJECTION 76%
75.0000 mL | Freq: Once | INTRAVENOUS | Status: AC | PRN
  Administered 2018-03-12: 60 mL via INTRAVENOUS

## 2018-03-14 LAB — CUP PACEART REMOTE DEVICE CHECK
Battery Voltage: 2.92 V
Brady Statistic RV Percent Paced: 1 %
Date Time Interrogation Session: 20190722062529
HighPow Impedance: 81 Ohm
HighPow Impedance: 81 Ohm
Implantable Lead Implant Date: 20130108
Implantable Lead Location: 753860
Lead Channel Pacing Threshold Pulse Width: 0.5 ms
Lead Channel Sensing Intrinsic Amplitude: 12 mV
Lead Channel Setting Sensing Sensitivity: 0.5 mV
MDC IDC MSMT BATTERY REMAINING LONGEVITY: 47 mo
MDC IDC MSMT BATTERY REMAINING PERCENTAGE: 41 %
MDC IDC MSMT LEADCHNL RV IMPEDANCE VALUE: 530 Ohm
MDC IDC MSMT LEADCHNL RV PACING THRESHOLD AMPLITUDE: 0.75 V
MDC IDC PG IMPLANT DT: 20130108
MDC IDC SET LEADCHNL RV PACING AMPLITUDE: 2.5 V
MDC IDC SET LEADCHNL RV PACING PULSEWIDTH: 0.5 ms
Pulse Gen Serial Number: 1016523

## 2018-03-15 ENCOUNTER — Ambulatory Visit (HOSPITAL_COMMUNITY)
Admission: RE | Admit: 2018-03-15 | Discharge: 2018-03-15 | Disposition: A | Discharge status: Home / Self Care | Payer: Medicare Other | Source: Ambulatory Visit | Attending: Surgery | Admitting: Surgery

## 2018-03-15 DIAGNOSIS — I739 Peripheral vascular disease, unspecified: Secondary | ICD-10-CM | POA: Insufficient documentation

## 2018-03-16 ENCOUNTER — Encounter: Payer: Self-pay | Admitting: Family Medicine

## 2018-03-16 ENCOUNTER — Other Ambulatory Visit: Payer: Self-pay | Admitting: Gastroenterology

## 2018-03-16 ENCOUNTER — Ambulatory Visit (INDEPENDENT_AMBULATORY_CARE_PROVIDER_SITE_OTHER): Payer: Medicare Other | Admitting: Family Medicine

## 2018-03-16 ENCOUNTER — Other Ambulatory Visit: Payer: Self-pay

## 2018-03-16 VITALS — BP 116/75 | HR 87 | Temp 98.3°F | Ht 63.5 in | Wt 174.8 lb

## 2018-03-16 DIAGNOSIS — K219 Gastro-esophageal reflux disease without esophagitis: Secondary | ICD-10-CM

## 2018-03-16 DIAGNOSIS — F4321 Adjustment disorder with depressed mood: Secondary | ICD-10-CM | POA: Diagnosis not present

## 2018-03-16 DIAGNOSIS — I712 Thoracic aortic aneurysm, without rupture: Secondary | ICD-10-CM | POA: Diagnosis not present

## 2018-03-16 DIAGNOSIS — N289 Disorder of kidney and ureter, unspecified: Secondary | ICD-10-CM | POA: Diagnosis not present

## 2018-03-16 DIAGNOSIS — K581 Irritable bowel syndrome with constipation: Secondary | ICD-10-CM | POA: Diagnosis not present

## 2018-03-16 DIAGNOSIS — Z634 Disappearance and death of family member: Secondary | ICD-10-CM

## 2018-03-16 DIAGNOSIS — I7122 Aneurysm of the aortic arch, without rupture: Secondary | ICD-10-CM

## 2018-03-16 DIAGNOSIS — I10 Essential (primary) hypertension: Secondary | ICD-10-CM

## 2018-03-16 LAB — POCT URINALYSIS DIP (MANUAL ENTRY)
Bilirubin, UA: NEGATIVE
Glucose, UA: NEGATIVE mg/dL
Ketones, POC UA: NEGATIVE mg/dL
Leukocytes, UA: NEGATIVE
Nitrite, UA: NEGATIVE
Protein Ur, POC: NEGATIVE mg/dL
Spec Grav, UA: 1.02 (ref 1.010–1.025)
Urobilinogen, UA: 0.2 E.U./dL
pH, UA: 6 (ref 5.0–8.0)

## 2018-03-16 MED ORDER — AMITRIPTYLINE HCL 50 MG PO TABS: 50.0000 mg | ORAL_TABLET | Freq: Every day | ORAL | 0 refills | Status: DC

## 2018-03-16 MED ORDER — ALBUTEROL SULFATE HFA 108 (90 BASE) MCG/ACT IN AERS: 2.0000 | INHALATION_SPRAY | Freq: Four times a day (QID) | RESPIRATORY_TRACT | 5 refills | Status: DC | PRN

## 2018-03-16 NOTE — Progress Notes (Signed)
8/20/20192:10 PM  [name] 1946-04-01, 72 y.o. female [id number]  Chief Complaint  Patient presents with  . Follow-up    chk of kidney function and grief of loss of child. Asking for medication to help her for night time. Still crys herself to sleep  . Excessive Sweating    not sure of onset    HPI:   Patient is a 72 y.o. female with past medical history significant for CAD, CHF, PAD, HTN, HLP, IBS, GERD, migraines, CKD 3, COPD, grief, prediabetes who presents today for followup  Recently seen by vasc sugr for thoracic aneurysm, possible surgery   On TCA for IBS, has been wo abd pain for years since on it, afraid of getting off from it  Tried reaching out to hospice for counseling. Never called back Continues to struggle, cries every night, not sleeping well, not wanting to leave her house, her memory is worse. Not interested in taking anymore medications. Asking about resources. Denies SI  Otherwise requesting urine to be checked.    Fall Risk  03/16/2018 12/18/2017 11/21/2017 10/29/2017 09/18/2017  Falls in the past year? No No No No Yes  Number falls in past yr: - - - - 1  Injury with Fall? - - - - No  Follow up - - - - Falls evaluation completed     Depression screen [facility] 2/9 12/18/2017 11/21/2017 10/29/2017  Decreased Interest 0 0 (No Data)  Down, Depressed, Hopeless 0 0 -  PHQ - 2 Score 0 0 -  Some recent data might be hidden    Allergies  Allergen Reactions  . Lisinopril     cough  . Sulfa Antibiotics Other (See Comments)    Unknown, childhood allergy   . Sulfonamide Derivatives Other (See Comments)    UNSURE    Prior to Admission medications   Medication Sig Start Date End Date Taking? Authorizing Provider  albuterol (PROAIR HFA) 108 (90 Base) MCG/ACT inhaler Inhale 2 puffs into the lungs every 6 (six) hours as needed. For shortness of breath 09/18/17  Yes Rutherford Guys, MD  amitriptyline (ELAVIL) 50 MG tablet Take 1 tablet (50 mg total) by mouth at  bedtime. 12/18/17  Yes Rutherford Guys, MD  aspirin 81 MG tablet Take 81 mg by mouth every morning.    Yes [provider]  bisoprolol (ZEBETA) 5 MG tablet TAKE 1 TABLET (5 MG TOTAL) BY MOUTH DAILY. 12/14/17  Yes [name], MD  conjugated estrogens (PREMARIN) vaginal cream Apply 1 fingertip to outside of vagina twice a week 09/18/17  Yes Rutherford Guys, MD  DEXILANT 60 MG capsule TAKE 1 CAPSULE BY MOUTH DAILY. 11/11/17  Yes Armbruster, Carlota Raspberry, MD  dicyclomine (BENTYL) 10 MG capsule Take 1 capsule (10 mg total) by mouth every 8 (eight) hours as needed for spasms. 05/20/17  Yes Armbruster, Carlota Raspberry, MD  ENTRESTO 24-26 MG TAKE 1 TABLET BY MOUTH 2 TIMES DAILY. 10/09/17  Yes [name], MD  Fluticasone-Salmeterol (ADVAIR) 250-50 MCG/DOSE AEPB Inhale 1 puff into the lungs 2 (two) times daily. 12/08/14  Yes [provider]  furosemide (LASIX) 20 MG tablet TAKE 1 TABLET BY MOUTH DAILY. 01/13/18  Yes Allred, Jeneen Rinks, MD  ondansetron (ZOFRAN) 4 MG tablet Take 1 tablet (4 mg total) by mouth every 8 (eight) hours as needed for nausea or vomiting. 11/21/17  Yes Stallings, Zoe A, MD  polyethylene glycol powder (GLYCOLAX/MIRALAX) powder Take 17 g by mouth daily as needed for mild constipation. 07/08/17  Yes Rutherford Guys, MD  ranitidine (ZANTAC) 150 MG tablet TAKE 1 TABLET BY MOUTH 2 TIMES DAILY. 05/13/17  Yes Armbruster, Carlota Raspberry, MD  rosuvastatin (CRESTOR) 20 MG tablet TAKE 1 TABLET BY MOUTH AT BEDTIME. 06/30/17  Yes [name], MD  topiramate (TOPAMAX) 50 MG tablet Take 2 tablets (100 mg total) by mouth daily. 11/21/17  Yes Forrest Moron, MD    Past Medical History:  Diagnosis Date  . AICD (automatic cardioverter/defibrillator) present    Dr.Allred follows  . Aortic arch pseudoaneurysm (HCC)    a. followed by Dr. Prescott Gum.  . Asthma   . Benign neoplasm of colon   . CAD (coronary artery disease) 02/2009   a. anterior STEMI rx with BMS to prox LAD in 02/2009. b. ISR s/p  PTCA 06/2009. c. ISR s/p thrombectomy & PTCA 03/2010 due to late stent thrombosis.   . Chronic renal insufficiency   . Chronic systolic CHF (congestive heart failure) ([place])    a. s/p ST. Jude ICD 2013.  . Colon polyp   . COPD (chronic obstructive pulmonary disease) ([place])   . Diverticulosis   . GI bleed    a. h/o GIB on DAPT, now on ASA only.  Marland Kitchen Hearing loss    left ear  . Hyperlipemia   . Hypertension   . Insomnia   . Irritable bowel syndrome   . Ischemic cardiomyopathy    EF 10-15%  . Memory loss   . Migraine headache without aura   . Myocardial infarct [facility]) 2011 x 2   Dr. [place] Nickel   . Pulmonary embolism ([place]) 2011  . Transfusion history    ?'12 or '13  . Unspecified mastoiditis     Past Surgical History:  Procedure Laterality Date  . ANGIOPLASTY  07/02/09, 04/01/10  . BILATERAL SALPINGOOPHORECTOMY    . CAD( bare metal stent)  02/2009   x 1  . CERVICAL SPINE SURGERY  08/08  . COLONOSCOPY WITH PROPOFOL N/A 04/29/2016   Procedure: COLONOSCOPY WITH PROPOFOL;  Surgeon: [name], MD;  Location: [facility] ENDOSCOPY;  Service: Gastroenterology;  Laterality: N/A;  . ESOPHAGOGASTRODUODENOSCOPY N/A 12/09/2016   Procedure: ESOPHAGOGASTRODUODENOSCOPY (EGD);  Surgeon: [name], MD;  Location: [facility];  Service: Gastroenterology;  Laterality: N/A;  . ESOPHAGOGASTRODUODENOSCOPY (EGD) WITH PROPOFOL N/A 04/29/2016   Procedure: ESOPHAGOGASTRODUODENOSCOPY (EGD) WITH PROPOFOL;  Surgeon: [name], MD;  Location: [facility] ENDOSCOPY;  Service: Gastroenterology;  Laterality: N/A;  . EUS N/A 05/15/2016   Procedure: UPPER ENDOSCOPIC ULTRASOUND (EUS) RADIAL;  Surgeon: Milus Banister, MD;  Location: [facility] ENDOSCOPY;  Service: Endoscopy;  Laterality: N/A;  . IMPLANTABLE CARDIOVERTER DEFIBRILLATOR IMPLANT N/A 08/05/2011   Primary prevention SJM ICD implanted,  Analyze ST study patient  . INNER EAR SURGERY     left x 17  . LUMBAR DISC SURGERY  02/2008   fusion  .  TOTAL ABDOMINAL HYSTERECTOMY     complete    Social History   Tobacco Use  . Smoking status: Former Smoker    Packs/day: 0.25    Types: Cigarettes    Last attempt to quit: 04/27/2009    Years since quitting: 8.8  . Smokeless tobacco: Never Used  Substance Use Topics  . Alcohol use: No    Alcohol/week: 0.0 standard drinks    Family History  Problem Relation Age of Onset  . Colon cancer Mother   . Colon cancer Brother   . Bladder Cancer Brother   . Breast cancer Cousin   .  Cancer Son     Review of Systems  Constitutional: Negative for chills and fever.  Respiratory: Negative for cough and shortness of breath.   Cardiovascular: Negative for chest pain, palpitations and leg swelling.  Gastrointestinal: Negative for abdominal pain, nausea and vomiting.  Psychiatric/Behavioral: Positive for depression and memory loss. Negative for suicidal ideas. The patient is nervous/anxious and has insomnia.      OBJECTIVE:  Blood pressure 116/75, pulse 87, temperature 98.3 F (36.8 C), temperature source Oral, height 5' 3.5" (1.613 m), weight 174 lb 12.8 oz (79.3 kg), SpO2 95 %. Body mass index is 30.48 kg/m.   Physical Exam  Constitutional: She is oriented to person, place, and time. She appears well-developed and well-nourished.  HENT:  Head: Normocephalic and atraumatic.  Mouth/Throat: Mucous membranes are normal.  Eyes: Pupils are equal, round, and reactive to light. EOM are normal. No scleral icterus.  Neck: Neck supple.  Pulmonary/Chest: Effort normal.  Neurological: She is alert and oriented to person, place, and time.  Skin: Skin is warm and dry.  Psychiatric: She has a normal mood and affect.  Nursing note and vitals reviewed.   Results for orders placed or performed in visit on 03/16/18 (from the past 24 hour(s))  POCT urinalysis dipstick     Status: Abnormal   Collection Time: 03/16/18  2:52 PM  Result Value Ref Range   Color, UA yellow yellow   Clarity, UA clear  clear   Glucose, UA negative negative mg/dL   Bilirubin, UA negative negative   Ketones, POC UA negative negative mg/dL   Spec Grav, UA 1.020 1.010 - 1.025   Blood, UA trace-intact (A) negative   pH, UA 6.0 5.0 - 8.0   Protein Ur, POC negative negative mg/dL   Urobilinogen, UA 0.2 0.2 or 1.0 E.U./dL   Nitrite, UA Negative Negative   Leukocytes, UA Negative Negative   Mini-cog: 3/3  ASSESSMENT and PLAN  1. Grief at loss of child Uncontrolled, with concerns for depression. Patient declines meds at this time. Resources given. RTC precautions discussed  2. Function kidney decreased Neg protein in urine - POCT urinalysis dipstick  3. Pseudoaneurysm of aortic arch (HCC) Currently being eval by vasc surg for potential surgery  4. Irritable bowel syndrome with constipation Controlled. Continue current regime.   5. Essential hypertension Controlled. Continue current regime.   Other orders - albuterol (PROAIR HFA) 108 (90 Base) MCG/ACT inhaler; Inhale 2 puffs into the lungs every 6 (six) hours as needed. For shortness of breath - amitriptyline (ELAVIL) 50 MG tablet; Take 1 tablet (50 mg total) by mouth at bedtime.  Return in about 3 months (around 06/16/2018).    Rutherford Guys, MD Primary Care at [place], [place] [address] Ph.  [phone] Fax [phone]

## 2018-03-16 NOTE — Patient Instructions (Addendum)
  [facility] [place]  [place] Email: [email or web address]  Phone Contact: Becky [phone] [place], [place] Meeting Info: 1st Monday of each month 7:00 pm Chapter Number: 9311    I encourage you to consider grief counseling. Here are some resources:   [employer], [phone]  [facility] [place], [phone]  [facility], [phone]   If you have lab work done today you will be contacted with your lab results within the next 2 weeks.  If you have not heard from [place] then please contact us. The fastest way to get your results is to register for My Chart.   IF you received an x-ray today, you will receive an invoice from [facility]. Please contact [facility] at [phone] with questions or concerns regarding your invoice.   IF you received labwork today, you will receive an invoice from [facility]. Please contact [facility] at [phone] with questions or concerns regarding your invoice.   Our billing staff will not be able to assist you with questions regarding bills from these companies.  You will be contacted with the lab results as soon as they are available. The fastest way to get your results is to activate your My Chart account. Instructions are located on the last page of this paperwork. If you have not heard from [place] regarding the results in 2 weeks, please contact this office.

## 2018-03-22 ENCOUNTER — Other Ambulatory Visit: Payer: Self-pay

## 2018-03-22 ENCOUNTER — Encounter: Payer: Self-pay | Admitting: *Deleted

## 2018-03-22 ENCOUNTER — Other Ambulatory Visit: Payer: Self-pay | Admitting: *Deleted

## 2018-03-22 ENCOUNTER — Encounter: Payer: Self-pay | Admitting: Surgery

## 2018-03-22 ENCOUNTER — Ambulatory Visit (INDEPENDENT_AMBULATORY_CARE_PROVIDER_SITE_OTHER): Payer: Medicare Other | Admitting: Surgery

## 2018-03-22 ENCOUNTER — Telehealth: Payer: Self-pay

## 2018-03-22 VITALS — BP 128/84 | HR 88 | Temp 98.8°F | Resp 16 | Ht 63.5 in | Wt 174.0 lb

## 2018-03-22 DIAGNOSIS — I700 Atherosclerosis of aorta: Secondary | ICD-10-CM

## 2018-03-22 DIAGNOSIS — I719 Aortic aneurysm of unspecified site, without rupture: Secondary | ICD-10-CM

## 2018-03-22 NOTE — H&P (View-Only) (Signed)
[facility] [place]  Patient name: [name] MRN: [id number] DOB: 28-Nov-1945 Sex: female   REASON FOR VISIT:    PAU  HISOTRY OF PRESENT ILLNESS:    [name] is a 72 y.o.  female, who returns after having gotten a CT scan to evaluate her thoracic aortic penetrating ulcer.  She is pain-free.  The patient has a history of ischemic cardiomyopathy.  Her ejection fraction is around 25%.  I had evaluated her for possible Barostim therapy in the past.  She has a history of PCI in the past.  She is chest pain-free.  She feels like she is doing very well.  She has an ICD in place.  She takes a statin for hypercholesterolemia.  She has stage III renal insufficiency.    PAST MEDICAL HISTORY:   Past Medical History:  Diagnosis Date  . AICD (automatic cardioverter/defibrillator) present    Dr.Allred follows  . Aortic arch pseudoaneurysm (HCC)    a. followed by Dr. Prescott Gum.  . Asthma   . Benign neoplasm of colon   . CAD (coronary artery disease) 02/2009   a. anterior STEMI rx with BMS to prox LAD in 02/2009. b. ISR s/p PTCA 06/2009. c. ISR s/p thrombectomy & PTCA 03/2010 due to late stent thrombosis.   . Chronic renal insufficiency   . Chronic systolic CHF (congestive heart failure) ([place])    a. s/p ST. Jude ICD 2013.  . Colon polyp   . COPD (chronic obstructive pulmonary disease) ([place])   . Diverticulosis   . GI bleed    a. h/o GIB on DAPT, now on ASA only.  Marland Kitchen Hearing loss    left ear  . Hyperlipemia   . Hypertension   . Insomnia   . Irritable bowel syndrome   . Ischemic cardiomyopathy    EF 10-15%  . Memory loss   . Migraine headache without aura   . Myocardial infarct [facility]) 2011 x 2   Dr. [place] Nickel   . Pulmonary embolism ([place]) 2011  . Transfusion history    ?'12 or '13  . Unspecified mastoiditis      FAMILY HISTORY:   Family History  Problem Relation Age of Onset  . Colon cancer Mother   .  Colon cancer Brother   . Bladder Cancer Brother   . Breast cancer Cousin   . Cancer Son     SOCIAL HISTORY:   Social History   Tobacco Use  . Smoking status: Current Some Day Smoker    Packs/day: 0.25    Types: Cigarettes  . Smokeless tobacco: Never Used  . Tobacco comment: Restarted smoking.  1 pk lasts a week.  Substance Use Topics  . Alcohol use: No    Alcohol/week: 0.0 standard drinks     ALLERGIES:   Allergies  Allergen Reactions  . Lisinopril     cough  . Sulfa Antibiotics Other (See Comments)    Unknown, childhood allergy   . Sulfonamide Derivatives Other (See Comments)    UNSURE     CURRENT MEDICATIONS:   Current Outpatient Medications  Medication Sig Dispense Refill  . albuterol (PROAIR HFA) 108 (90 Base) MCG/ACT inhaler Inhale 2 puffs into the lungs every 6 (six) hours as needed. For shortness of breath 18 g 5  . amitriptyline (ELAVIL) 50 MG tablet Take 1 tablet (50 mg total) by mouth at bedtime. 90 tablet 0  . aspirin 81 MG tablet Take 81 mg by mouth every morning.     Marland Kitchen  bisoprolol (ZEBETA) 5 MG tablet TAKE 1 TABLET (5 MG TOTAL) BY MOUTH DAILY. 90 tablet 3  . conjugated estrogens (PREMARIN) vaginal cream Apply 1 fingertip to outside of vagina twice a week 30 g 12  . DEXILANT 60 MG capsule TAKE 1 CAPSULE BY MOUTH DAILY. 30 capsule 3  . dicyclomine (BENTYL) 10 MG capsule TAKE 1 CAPSULE BY MOUTH EVERY 8 HOURS AS NEEDED FOR SPASMS. 30 capsule 3  . ENTRESTO 24-26 MG TAKE 1 TABLET BY MOUTH 2 TIMES DAILY. 180 tablet 1  . Fluticasone-Salmeterol (ADVAIR) 250-50 MCG/DOSE AEPB Inhale 1 puff into the lungs 2 (two) times daily.    . furosemide (LASIX) 20 MG tablet TAKE 1 TABLET BY MOUTH DAILY. 30 tablet 11  . ondansetron (ZOFRAN) 4 MG tablet Take 1 tablet (4 mg total) by mouth every 8 (eight) hours as needed for nausea or vomiting. 20 tablet 0  . polyethylene glycol powder (GLYCOLAX/MIRALAX) powder Take 17 g by mouth daily as needed for mild constipation. 255 g 11  .  ranitidine (ZANTAC) 150 MG tablet TAKE 1 TABLET BY MOUTH 2 TIMES DAILY. 180 tablet 0  . rosuvastatin (CRESTOR) 20 MG tablet TAKE 1 TABLET BY MOUTH AT BEDTIME. 90 tablet 3  . topiramate (TOPAMAX) 50 MG tablet Take 2 tablets (100 mg total) by mouth daily. 90 tablet 0   No current facility-administered medications for this visit.     REVIEW OF SYSTEMS:   [X]  denotes positive finding, [ ]  denotes negative finding Cardiac  Comments:  Chest pain or chest pressure:    Shortness of breath upon exertion: x   Short of breath when lying flat: x   Irregular heart rhythm:        Vascular    Pain in calf, thigh, or hip brought on by ambulation:    Pain in feet at night that wakes you up from your sleep:     Blood clot in your veins:    Leg swelling:         Pulmonary    Oxygen at home:    Productive cough:     Wheezing:         Neurologic    Sudden weakness in arms or legs:     Sudden numbness in arms or legs:     Sudden onset of difficulty speaking or slurred speech:    Temporary loss of vision in one eye:     Problems with dizziness:  x       Gastrointestinal    Blood in stool:     Vomited blood:         Genitourinary    Burning when urinating:     Blood in urine:        Psychiatric    Major depression:         Hematologic    Bleeding problems:    Problems with blood clotting too easily:        Skin    Rashes or ulcers:        Constitutional    Fever or chills:      PHYSICAL EXAM:   Vitals:   03/22/18 1042  BP: 128/84  Pulse: 88  Resp: 16  Temp: 98.8 F (37.1 C)  TempSrc: Oral  SpO2: 95%  Weight: 174 lb (78.9 kg)  Height: 5' 3.5" (1.613 m)    GENERAL: The patient is a well-nourished female, in no acute distress. The vital signs are documented above. CARDIAC: There is a regular rate  and rhythm.  VASCULAR: palpable PT pulses PULMONARY: Non-labored respirations MUSCULOSKELETAL: There are no major deformities or cyanosis. NEUROLOGIC: No focal weakness or  paresthesias are detected. SKIN: There are no ulcers or rashes noted. PSYCHIATRIC: The patient has a normal affect.  STUDIES:   I have ordered and reviewed her CT scan with the following findings:  Similar appearance of aortic arch saccular aneurysm/penetrating ulcer, with no acute inflammatory changes or evidence of dissection. The overall size is unchanged from the prior noncontrast CT.  Aortic Atherosclerosis (ICD10-I70.0). Associated coronary artery disease.  Bilateral renal artery disease, with the CT demonstrating evidence of developing stenosis of the bilateral main renal arteries. Accessory renal arteries are present bilaterally. Correlation with renal duplex may be useful.  Improved appearance of the lungs, suggesting prior findings were related to inflammation/infection.   Carotid Dopplers were within normal limits in 2018  MEDICAL ISSUES:   Thoracic aortic penetrating ulcer: We discussed proceeding with repair.  This will involve a left carotid to subclavian bypass versus transposition in addition to thoracic aortic stent grafting, which should be able to be done through a femoral approach, percutaneously.  She is here today with her brother.  I discussed the details of the operation including the risks and benefits.  She recently lost her son and wants to get this done as soon as possible just to get it over with.  I will reach out to Dr. Burt Knack to make sure that from a cardiac perspective she is safe to proceed.  I have placed her on the schedule for Wednesday, September 4    Annamarie Major, MD [facility] [phone] Pager [phone]

## 2018-03-22 NOTE — Telephone Encounter (Signed)
Author: [name], MD Service: Cardiology Author Type: Physician  Filed: 03/22/2018 1:31 PM Encounter Date: 03/22/2018 Status: Signed  Editor: [name], MD (Physician)     Chart reviewed. Pt well-known to me. She has advanced LV dysfunction but has been remarkably stable on medical therapy with no anginal symptoms and no problems with decompensated heart failure. Ok to proceed with surgery as planned at moderate cardiac risk.        Forwarded to Dr. Trula Slade.  Faxed to [phone]

## 2018-03-22 NOTE — Telephone Encounter (Signed)
-----   Message from [name], MD sent at 03/22/2018  1:31 PM EDT -----   ----- Message ----- From: Serafina Mitchell, MD Sent: 03/22/2018  11:19 AM EDT To: Ivin Poot, MD, [name], MD

## 2018-03-22 NOTE — Telephone Encounter (Signed)
   [facility] Pre-operative Risk Assessment    Request for surgical clearance:  1. What type of surgery is being performed?     - LEFT SCA Bypass, TEVAR  2. When is this surgery scheduled?     - 03/31/18  3. Are there any medications that need to be held prior to surgery and how long?    - Please advise  4. Practice name and name of physician performing surgery?     - [facility]   - Dr. Trula Slade  5. What is your office phone and fax number?     - Phone: [phone]   - Fax: [phone]  6. Anesthesia type (None, local, MAC, general) ?    - N/A   [name] 03/22/2018, 1:26 PM  _________________________________________________________________   (provider comments below)

## 2018-03-22 NOTE — Progress Notes (Signed)
[facility] [place]  Patient name: [name] MRN: [id number] DOB: Dec 17, 1945 Sex: female   REASON FOR VISIT:    PAU  HISOTRY OF PRESENT ILLNESS:    [name] is a 72 y.o.  female, who returns after having gotten a CT scan to evaluate her thoracic aortic penetrating ulcer.  She is pain-free.  The patient has a history of ischemic cardiomyopathy.  Her ejection fraction is around 25%.  I had evaluated her for possible Barostim therapy in the past.  She has a history of PCI in the past.  She is chest pain-free.  She feels like she is doing very well.  She has an ICD in place.  She takes a statin for hypercholesterolemia.  She has stage III renal insufficiency.    PAST MEDICAL HISTORY:   Past Medical History:  Diagnosis Date  . AICD (automatic cardioverter/defibrillator) present    Dr.Allred follows  . Aortic arch pseudoaneurysm (HCC)    a. followed by Dr. Prescott Gum.  . Asthma   . Benign neoplasm of colon   . CAD (coronary artery disease) 02/2009   a. anterior STEMI rx with BMS to prox LAD in 02/2009. b. ISR s/p PTCA 06/2009. c. ISR s/p thrombectomy & PTCA 03/2010 due to late stent thrombosis.   . Chronic renal insufficiency   . Chronic systolic CHF (congestive heart failure) ([place])    a. s/p ST. Jude ICD 2013.  . Colon polyp   . COPD (chronic obstructive pulmonary disease) ([place])   . Diverticulosis   . GI bleed    a. h/o GIB on DAPT, now on ASA only.  Marland Kitchen Hearing loss    left ear  . Hyperlipemia   . Hypertension   . Insomnia   . Irritable bowel syndrome   . Ischemic cardiomyopathy    EF 10-15%  . Memory loss   . Migraine headache without aura   . Myocardial infarct [facility]) 2011 x 2   Dr. [place] Nickel   . Pulmonary embolism ([place]) 2011  . Transfusion history    ?'12 or '13  . Unspecified mastoiditis      FAMILY HISTORY:   Family History  Problem Relation Age of Onset  . Colon cancer Mother   .  Colon cancer Brother   . Bladder Cancer Brother   . Breast cancer Cousin   . Cancer Son     SOCIAL HISTORY:   Social History   Tobacco Use  . Smoking status: Current Some Day Smoker    Packs/day: 0.25    Types: Cigarettes  . Smokeless tobacco: Never Used  . Tobacco comment: Restarted smoking.  1 pk lasts a week.  Substance Use Topics  . Alcohol use: No    Alcohol/week: 0.0 standard drinks     ALLERGIES:   Allergies  Allergen Reactions  . Lisinopril     cough  . Sulfa Antibiotics Other (See Comments)    Unknown, childhood allergy   . Sulfonamide Derivatives Other (See Comments)    UNSURE     CURRENT MEDICATIONS:   Current Outpatient Medications  Medication Sig Dispense Refill  . albuterol (PROAIR HFA) 108 (90 Base) MCG/ACT inhaler Inhale 2 puffs into the lungs every 6 (six) hours as needed. For shortness of breath 18 g 5  . amitriptyline (ELAVIL) 50 MG tablet Take 1 tablet (50 mg total) by mouth at bedtime. 90 tablet 0  . aspirin 81 MG tablet Take 81 mg by mouth every morning.     Marland Kitchen  bisoprolol (ZEBETA) 5 MG tablet TAKE 1 TABLET (5 MG TOTAL) BY MOUTH DAILY. 90 tablet 3  . conjugated estrogens (PREMARIN) vaginal cream Apply 1 fingertip to outside of vagina twice a week 30 g 12  . DEXILANT 60 MG capsule TAKE 1 CAPSULE BY MOUTH DAILY. 30 capsule 3  . dicyclomine (BENTYL) 10 MG capsule TAKE 1 CAPSULE BY MOUTH EVERY 8 HOURS AS NEEDED FOR SPASMS. 30 capsule 3  . ENTRESTO 24-26 MG TAKE 1 TABLET BY MOUTH 2 TIMES DAILY. 180 tablet 1  . Fluticasone-Salmeterol (ADVAIR) 250-50 MCG/DOSE AEPB Inhale 1 puff into the lungs 2 (two) times daily.    . furosemide (LASIX) 20 MG tablet TAKE 1 TABLET BY MOUTH DAILY. 30 tablet 11  . ondansetron (ZOFRAN) 4 MG tablet Take 1 tablet (4 mg total) by mouth every 8 (eight) hours as needed for nausea or vomiting. 20 tablet 0  . polyethylene glycol powder (GLYCOLAX/MIRALAX) powder Take 17 g by mouth daily as needed for mild constipation. 255 g 11  .  ranitidine (ZANTAC) 150 MG tablet TAKE 1 TABLET BY MOUTH 2 TIMES DAILY. 180 tablet 0  . rosuvastatin (CRESTOR) 20 MG tablet TAKE 1 TABLET BY MOUTH AT BEDTIME. 90 tablet 3  . topiramate (TOPAMAX) 50 MG tablet Take 2 tablets (100 mg total) by mouth daily. 90 tablet 0   No current facility-administered medications for this visit.     REVIEW OF SYSTEMS:   [X]  denotes positive finding, [ ]  denotes negative finding Cardiac  Comments:  Chest pain or chest pressure:    Shortness of breath upon exertion: x   Short of breath when lying flat: x   Irregular heart rhythm:        Vascular    Pain in calf, thigh, or hip brought on by ambulation:    Pain in feet at night that wakes you up from your sleep:     Blood clot in your veins:    Leg swelling:         Pulmonary    Oxygen at home:    Productive cough:     Wheezing:         Neurologic    Sudden weakness in arms or legs:     Sudden numbness in arms or legs:     Sudden onset of difficulty speaking or slurred speech:    Temporary loss of vision in one eye:     Problems with dizziness:  x       Gastrointestinal    Blood in stool:     Vomited blood:         Genitourinary    Burning when urinating:     Blood in urine:        Psychiatric    Major depression:         Hematologic    Bleeding problems:    Problems with blood clotting too easily:        Skin    Rashes or ulcers:        Constitutional    Fever or chills:      PHYSICAL EXAM:   Vitals:   03/22/18 1042  BP: 128/84  Pulse: 88  Resp: 16  Temp: 98.8 F (37.1 C)  TempSrc: Oral  SpO2: 95%  Weight: 174 lb (78.9 kg)  Height: 5' 3.5" (1.613 m)    GENERAL: The patient is a well-nourished female, in no acute distress. The vital signs are documented above. CARDIAC: There is a regular rate  and rhythm.  VASCULAR: palpable PT pulses PULMONARY: Non-labored respirations MUSCULOSKELETAL: There are no major deformities or cyanosis. NEUROLOGIC: No focal weakness or  paresthesias are detected. SKIN: There are no ulcers or rashes noted. PSYCHIATRIC: The patient has a normal affect.  STUDIES:   I have ordered and reviewed her CT scan with the following findings:  Similar appearance of aortic arch saccular aneurysm/penetrating ulcer, with no acute inflammatory changes or evidence of dissection. The overall size is unchanged from the prior noncontrast CT.  Aortic Atherosclerosis (ICD10-I70.0). Associated coronary artery disease.  Bilateral renal artery disease, with the CT demonstrating evidence of developing stenosis of the bilateral main renal arteries. Accessory renal arteries are present bilaterally. Correlation with renal duplex may be useful.  Improved appearance of the lungs, suggesting prior findings were related to inflammation/infection.   Carotid Dopplers were within normal limits in 2018  MEDICAL ISSUES:   Thoracic aortic penetrating ulcer: We discussed proceeding with repair.  This will involve a left carotid to subclavian bypass versus transposition in addition to thoracic aortic stent grafting, which should be able to be done through a femoral approach, percutaneously.  She is here today with her brother.  I discussed the details of the operation including the risks and benefits.  She recently lost her son and wants to get this done as soon as possible just to get it over with.  I will reach out to Dr. Burt Knack to make sure that from a cardiac perspective she is safe to proceed.  I have placed her on the schedule for Wednesday, September 4    Annamarie Major, MD [facility] [phone] Pager [phone]

## 2018-03-22 NOTE — Progress Notes (Signed)
Chart reviewed. Pt well-known to me. She has advanced LV dysfunction but has been remarkably stable on medical therapy with no anginal symptoms and no problems with decompensated heart failure. Ok to proceed with surgery as planned at moderate cardiac risk.

## 2018-03-24 ENCOUNTER — Encounter: Payer: Self-pay | Admitting: Surgery

## 2018-03-25 ENCOUNTER — Telehealth: Payer: Self-pay | Admitting: Family Medicine

## 2018-03-25 ENCOUNTER — Telehealth: Payer: Self-pay | Admitting: *Deleted

## 2018-03-25 ENCOUNTER — Ambulatory Visit: Payer: Medicare Other | Admitting: Family Medicine

## 2018-03-25 NOTE — Pre-Procedure Instructions (Signed)
[name]  03/25/2018      [facility] - [place], [place] - [address] [place] [address] Phone: [phone] Fax: [phone]  [facility] [phone] [place], [place] - [address] [address] Phone: [phone] Fax: [phone]    Your procedure is scheduled on Wednesday September 4th.  Report to [facility] Admitting at 0830 A.M.  Call this number if you have problems the morning of surgery:  [phone]   Remember:  Do not eat or drink after midnight.     Take these medicines the morning of surgery with A SIP OF WATER  Albuterol (if needed), Bisoprolol (Zebeta), Dexilant, Bentyl (if needed), Entresto, Zantac (if needed)   7 days prior to surgery STOP taking any Aspirin(unless otherwise instructed by your surgeon), Aleve, Naproxen, Ibuprofen, Motrin, Advil, Goody's, BC's, all herbal medications, fish oil, and all vitamins    [place]- Preparing For Surgery  Before surgery, you can play an important role. Because skin is not sterile, your skin needs to be as free of germs as possible. You can reduce the number of germs on your skin by washing with CHG (chlorahexidine gluconate) Soap before surgery.  CHG is an antiseptic cleaner which kills germs and bonds with the skin to continue killing germs even after washing.    Oral Hygiene is also important to reduce your risk of infection.  Remember - BRUSH YOUR TEETH THE MORNING OF SURGERY WITH YOUR REGULAR TOOTHPASTE  Please do not use if you have an allergy to CHG or antibacterial soaps. If your skin becomes reddened/irritated stop using the CHG.  Do not shave (including legs and underarms) for at least 48 hours prior to first CHG shower. It is OK to shave your face.  Please follow these instructions carefully.   1. Shower the NIGHT BEFORE SURGERY and the MORNING OF SURGERY  with CHG.   2. If you chose to wash your hair, wash your hair first as usual with your normal shampoo.  3. After you shampoo, rinse your hair and body thoroughly to remove the shampoo.  4. Use CHG as you would any other liquid soap. You can apply CHG directly to the skin and wash gently with a scrungie or a clean washcloth.   5. Apply the CHG Soap to your body ONLY FROM THE NECK DOWN.  Do not use on open wounds or open sores. Avoid contact with your eyes, ears, mouth and genitals (private parts). Wash Face and genitals (private parts)  with your normal soap.  6. Wash thoroughly, paying special attention to the area where your surgery will be performed.  7. Thoroughly rinse your body with warm water from the neck down.  8. DO NOT shower/wash with your normal soap after using and rinsing off the CHG Soap.  9. Pat yourself dry with a CLEAN TOWEL.  10. Wear CLEAN PAJAMAS to bed the night before surgery, wear comfortable clothes the morning of surgery  11. Place CLEAN SHEETS on your bed the night of your first shower and DO NOT SLEEP WITH PETS.    Day of Surgery:  Do not apply any deodorants/lotions.  Please wear clean clothes to the hospital/surgery center.   Remember to brush your teeth WITH YOUR REGULAR TOOTHPASTE.      Do not wear jewelry, make-up or nail polish.  Do not wear lotions, powders, or  perfumes, or deodorant.  Do not shave 48 hours prior to surgery.  Men may shave face and neck.  Do not bring valuables to the hospital.  [facility] is not responsible for any belongings or valuables.  Contacts, dentures or bridgework may not be worn into surgery.  Leave your suitcase in the car.  After surgery it may be brought to your room.  For patients admitted to the hospital, discharge time will be determined by your treatment team.  Patients discharged the day of surgery will not be allowed to drive home.    Please read over the following fact sheets that you were  given. Coughing and Deep Breathing, MRSA Information and Surgical Site Infection Prevention

## 2018-03-25 NOTE — Telephone Encounter (Signed)
Patient was rejected their medication (Albuterol Sulfate) by their insurance company. I still attempted to do a PA but CoverMyMeds says it couldn't find the patient by name and DOB.

## 2018-03-26 ENCOUNTER — Encounter (HOSPITAL_COMMUNITY)
Admission: RE | Admit: 2018-03-26 | Discharge: 2018-03-26 | Disposition: A | Discharge status: Home / Self Care | Payer: Medicare Other | Source: Ambulatory Visit | Attending: Surgery | Admitting: Surgery

## 2018-03-26 ENCOUNTER — Other Ambulatory Visit: Payer: Self-pay

## 2018-03-26 ENCOUNTER — Encounter (HOSPITAL_COMMUNITY): Payer: Self-pay

## 2018-03-26 DIAGNOSIS — Z01812 Encounter for preprocedural laboratory examination: Secondary | ICD-10-CM | POA: Diagnosis present

## 2018-03-26 HISTORY — DX: Dyspnea, unspecified: R06.00

## 2018-03-26 HISTORY — DX: Prediabetes: R73.03

## 2018-03-26 HISTORY — DX: Pneumonia, unspecified organism: J18.9

## 2018-03-26 HISTORY — DX: Major depressive disorder, single episode, unspecified: F32.9

## 2018-03-26 HISTORY — DX: Unspecified osteoarthritis, unspecified site: M19.90

## 2018-03-26 HISTORY — DX: Depression, unspecified: F32.A

## 2018-03-26 HISTORY — DX: Adverse effect of unspecified anesthetic, initial encounter: T41.45XA

## 2018-03-26 HISTORY — DX: Other complications of anesthesia, initial encounter: T88.59XA

## 2018-03-26 HISTORY — DX: Cerebral infarction, unspecified: I63.9

## 2018-03-26 HISTORY — DX: Gastro-esophageal reflux disease without esophagitis: K21.9

## 2018-03-26 LAB — URINALYSIS, ROUTINE W REFLEX MICROSCOPIC
Bilirubin Urine: NEGATIVE
GLUCOSE, UA: NEGATIVE mg/dL
HGB URINE DIPSTICK: NEGATIVE
KETONES UR: NEGATIVE mg/dL
Leukocytes, UA: NEGATIVE
Nitrite: NEGATIVE
PROTEIN: NEGATIVE mg/dL
Specific Gravity, Urine: 1.017 (ref 1.005–1.030)
pH: 6 (ref 5.0–8.0)

## 2018-03-26 LAB — COMPREHENSIVE METABOLIC PANEL
ALT: 17 U/L (ref 0–44)
AST: 23 U/L (ref 15–41)
Albumin: 3.9 g/dL (ref 3.5–5.0)
Alkaline Phosphatase: 103 U/L (ref 38–126)
Anion gap: 10 (ref 5–15)
BUN: 23 mg/dL (ref 8–23)
CALCIUM: 9.1 mg/dL (ref 8.9–10.3)
CHLORIDE: 113 mmol/L — AB (ref 98–111)
CO2: 19 mmol/L — ABNORMAL LOW (ref 22–32)
CREATININE: 1.64 mg/dL — AB (ref 0.44–1.00)
GFR calc Af Amer: 36 mL/min — ABNORMAL LOW (ref >60)
GFR, EST NON AFRICAN AMERICAN: 31 mL/min — AB (ref >60)
Glucose, Bld: 96 mg/dL (ref 70–99)
Potassium: 4.8 mmol/L (ref 3.5–5.1)
Sodium: 142 mmol/L (ref 135–145)
Total Bilirubin: 0.8 mg/dL (ref 0.3–1.2)
Total Protein: 7.4 g/dL (ref 6.5–8.1)

## 2018-03-26 LAB — CBC
HCT: 40.4 % (ref 36.0–46.0)
HEMOGLOBIN: 12.6 g/dL (ref 12.0–15.0)
MCH: 32.4 pg (ref 26.0–34.0)
MCHC: 31.2 g/dL (ref 30.0–36.0)
MCV: 103.9 fL — AB (ref 78.0–100.0)
PLATELETS: 198 10*3/uL (ref 150–400)
RBC: 3.89 MIL/uL (ref 3.87–5.11)
RDW: 15.3 % (ref 11.5–15.5)
WBC: 7.8 10*3/uL (ref 4.0–10.5)

## 2018-03-26 LAB — PROTIME-INR
INR: 0.96
PROTHROMBIN TIME: 12.7 s (ref 11.4–15.2)

## 2018-03-26 LAB — SURGICAL PCR SCREEN
MRSA, PCR: NEGATIVE
STAPHYLOCOCCUS AUREUS: POSITIVE — AB

## 2018-03-26 LAB — HEMOGLOBIN A1C
Hgb A1c MFr Bld: 6.4 % — ABNORMAL HIGH (ref 4.8–5.6)
Mean Plasma Glucose: 136.98 mg/dL

## 2018-03-26 LAB — APTT: aPTT: 25 seconds (ref 24–36)

## 2018-03-26 LAB — GLUCOSE, CAPILLARY: GLUCOSE-CAPILLARY: 94 mg/dL (ref 70–99)

## 2018-03-26 NOTE — Progress Notes (Signed)
I called a prescription for Mupirocin ointment to [facility], [place], [place]

## 2018-03-26 NOTE — Pre-Procedure Instructions (Signed)
[name]  03/26/2018     Your procedure is scheduled on Wednesday September 4th.  Report to [facility] Admitting at 08:40 A.M.               Your surgery or procedure is scheduled for 10:40 AM   Do not eat or drink after midnight Tuesday, September 3.     Take these medicines the morning of surgery with A SIP OF WATER  Albuterol (if needed), Bisoprolol (Zebeta), Dexilant, Bentyl (if needed), Zantac (if needed)   7 days prior to surgery STOP taking any Aspirin(unless otherwise instructed by your surgeon), Aleve, Naproxen, Ibuprofen, Motrin, Advil, Goody's, BC's, all herbal medications, fish oil, and all vitamins    [place]- Preparing For Surgery  Before surgery, you can play an important role. Because skin is not sterile, your skin needs to be as free of germs as possible. You can reduce the number of germs on your skin by washing with CHG (chlorahexidine gluconate) Soap before surgery.  CHG is an antiseptic cleaner which kills germs and bonds with the skin to continue killing germs even after washing.    Oral Hygiene is also important to reduce your risk of infection.  Remember - BRUSH YOUR TEETH THE MORNING OF SURGERY WITH YOUR REGULAR TOOTHPASTE  Please do not use if you have an allergy to CHG or antibacterial soaps. If your skin becomes reddened/irritated stop using the CHG.  Do not shave (including legs and underarms) for at least 48 hours prior to first CHG shower. It is OK to shave your face.  Please follow these instructions carefully.   1. Shower the NIGHT BEFORE SURGERY and the MORNING OF SURGERY with CHG.   2. If you chose to wash your hair, wash your hair first as usual with your normal shampoo.  3. After you shampoo, rinse your hair and body thoroughly to remove the shampoo. Wash your face and private area with the soap you use at home, then rinse.  4. Use CHG as you would any other liquid soap. You can apply CHG directly to the skin and  wash gently with a scrungie or a clean washcloth.   Apply the CHG Soap to your body ONLY FROM THE NECK DOWN.  Do not use on open wounds or open sores. Avoid contact with your eyes, ears, mouth and genitals (private parts).   5. Wash thoroughly, paying special attention to the area where your surgery will be performed.  6. Thoroughly rinse your body with warm water from the neck down.  7. DO NOT shower/wash with your normal soap after using and rinsing off the CHG Soap.  8. Pat yourself dry with a CLEAN TOWEL.  9. Wear CLEAN PAJAMAS to bed the night before surgery, wear comfortable clothes the morning of surgery  10. Place CLEAN SHEETS on your bed the night of your first shower and DO NOT SLEEP WITH PETS.  Day of Surgery: Shower as above Do not apply any deodorants/lotions, powders or colognes..  Please wear clean clothes to the hospital/surgery center.   Remember to brush your teeth WITH YOUR REGULAR TOOTHPASTE.  Do not wear jewelry, make-up or nail polish.  Do not shave 48 hours prior to surgery.  Men may shave face and neck.  Do not bring valuables to the hospital.  [facility] is not responsible for any belongings or valuables.  Contacts, dentures or bridgework may not be worn into surgery.  Leave your suitcase in  the car.  After surgery it may be brought to your room.  For patients admitted to the hospital, discharge time will be determined by your treatment team.  Patients discharged the day of surgery will not be allowed to drive home.    Please read over the following fact sheets that you were given. Coughing and Deep Breathing, MRSA Information and Surgical Site Infection Prevention, Pain Management

## 2018-03-26 NOTE — Pre-Procedure Instructions (Signed)
[name]  03/26/2018     Your procedure is scheduled on Wednesday September 4th.  Report to [facility] Admitting at 08:40 A.M.               Your surgery or procedure is scheduled for 10:40 AM   Do not eat or drink after midnight Tuesday, September 3. For any questions or problems the morning of surgery call: [phone]  This is the number for the Pre- Surgical Desk.   Take these medicines the morning of surgery with A SIP OF WATER:   Bisoprolol (Zebeta)               Dexilant,              Dicyclomine  (Benty)  (if needed)              RanitidineZantac (if needed)   7 days prior to surgery STOP taking any Aspirin(unless otherwise instructed by your surgeon), Aleve, Naproxen, Ibuprofen, Motrin, Advil, Goody's, BC's, all herbal medications, fish oil, and all vitamins    [place]- Preparing For Surgery  Before surgery, you can play an important role. Because skin is not sterile, your skin needs to be as free of germs as possible. You can reduce the number of germs on your skin by washing with CHG (chlorahexidine gluconate) Soap before surgery.  CHG is an antiseptic cleaner which kills germs and bonds with the skin to continue killing germs even after washing.    Oral Hygiene is also important to reduce your risk of infection.  Remember - BRUSH YOUR TEETH THE MORNING OF SURGERY WITH YOUR REGULAR TOOTHPASTE  Please do not use if you have an allergy to CHG or antibacterial soaps. If your skin becomes reddened/irritated stop using the CHG.  Do not shave (including legs and underarms) for at least 48 hours prior to first CHG shower. It is OK to shave your face.  Please follow these instructions carefully.   1. Shower the NIGHT BEFORE SURGERY and the MORNING OF SURGERY with CHG.   2. If you chose to wash your hair, wash your hair first as usual with your normal shampoo.  3. After you shampoo, rinse your hair and body thoroughly to remove the shampoo. Wash  your face and private area with the soap you use at home, then rinse.  4. Use CHG as you would any other liquid soap. You can apply CHG directly to the skin and wash gently with a scrungie or a clean washcloth.   Apply the CHG Soap to your body ONLY FROM THE NECK DOWN.  Do not use on open wounds or open sores. Avoid contact with your eyes, ears, mouth and genitals (private parts).   5. Wash thoroughly, paying special attention to the area where your surgery will be performed.  6. Thoroughly rinse your body with warm water from the neck down.  7. DO NOT shower/wash with your normal soap after using and rinsing off the CHG Soap.  8. Pat yourself dry with a CLEAN TOWEL.  9. Wear CLEAN PAJAMAS to bed the night before surgery, wear comfortable clothes the morning of surgery  10. Place CLEAN SHEETS on your bed the night of your first shower and DO NOT SLEEP WITH PETS.  Day of Surgery: Shower as above Do not apply any deodorants/lotions, powders or colognes..  Please wear clean clothes to the hospital/surgery center.   Remember to brush your teeth WITH YOUR REGULAR TOOTHPASTE.  Do not wear jewelry, make-up or nail polish.  Do not shave 48 hours prior to surgery.  Men may shave face and neck.  Do not bring valuables to the hospital.  [facility] is not responsible for any belongings or valuables.  Contacts, dentures or bridgework may not be worn into surgery.  Leave your suitcase in the car.  After surgery it may be brought to your room.  For patients admitted to the hospital, discharge time will be determined by your treatment team.  Patients discharged the day of surgery will not be allowed to drive home.    Please read over the following fact sheets that you were given. Coughing and Deep Breathing, MRSA Information and Surgical Site Infection Prevention, Pain Management

## 2018-03-26 NOTE — Progress Notes (Signed)
[name] has a hx CAD, has a defibrillator. Patient's PCP is [name], cardiologist is Dr [name].  [name] states that she has mid sternal chest pain at times, patient rates it a 8/10.  Patient reports that pain is sharp , does not radiate, last 3 - 15 minutes, denies lightheadedness, N/V, shortness of breath. [name] states pain can come on weather she is doing any thing or not. She  Does not take anything or do anything to relieve it, "It might be  Acid reflux, but I take 2 medications for it."  While I was going over instructions, patient picked up her purse and was squirming in the chair,I asked if something was wrong, she said that her grandson was waiting on  her and that he probably thinks she should be out by now.  I asked her grandson's name and brought him to the room as patient asked.  I informed patient and grandson that she will have labs drawn and that I would speak to the PA about her chest pain.  Patients grand son asked about the chest pain and then said, oh that is acid reflux, we all have it, I have it like that, my father, her son has it also. I called Tammy Caldwell, PA and informed him of the "chest pain", [name] reviewed cardiology notes and said he did not need to see patient.

## 2018-03-31 ENCOUNTER — Encounter (HOSPITAL_COMMUNITY): Payer: Self-pay | Admitting: *Deleted

## 2018-03-31 ENCOUNTER — Inpatient Hospital Stay (HOSPITAL_COMMUNITY): Payer: Medicare Other | Admitting: Certified Registered Nurse Anesthetist

## 2018-03-31 ENCOUNTER — Inpatient Hospital Stay (HOSPITAL_COMMUNITY): Payer: Medicare Other

## 2018-03-31 ENCOUNTER — Encounter (HOSPITAL_COMMUNITY)
Admission: RE | Disposition: A | Discharge status: Home / Self Care | Payer: Self-pay | Source: Ambulatory Visit | Attending: Surgery

## 2018-03-31 ENCOUNTER — Inpatient Hospital Stay (HOSPITAL_COMMUNITY): Payer: Medicare Other | Admitting: Physician Assistant

## 2018-03-31 ENCOUNTER — Inpatient Hospital Stay (HOSPITAL_COMMUNITY)
Admission: RE | Admit: 2018-03-31 | Discharge: 2018-04-01 | DRG: 220 | Disposition: A | Discharge status: Home / Self Care | Payer: Medicare Other | Source: Ambulatory Visit | Attending: Surgery | Admitting: Surgery

## 2018-03-31 DIAGNOSIS — I130 Hypertensive heart and chronic kidney disease with heart failure and stage 1 through stage 4 chronic kidney disease, or unspecified chronic kidney disease: Secondary | ICD-10-CM | POA: Diagnosis present

## 2018-03-31 DIAGNOSIS — Z888 Allergy status to other drugs, medicaments and biological substances status: Secondary | ICD-10-CM | POA: Diagnosis not present

## 2018-03-31 DIAGNOSIS — I9589 Other hypotension: Secondary | ICD-10-CM | POA: Diagnosis not present

## 2018-03-31 DIAGNOSIS — Z955 Presence of coronary angioplasty implant and graft: Secondary | ICD-10-CM | POA: Diagnosis not present

## 2018-03-31 DIAGNOSIS — D62 Acute posthemorrhagic anemia: Secondary | ICD-10-CM | POA: Diagnosis not present

## 2018-03-31 DIAGNOSIS — I9788 Other intraoperative complications of the circulatory system, not elsewhere classified: Secondary | ICD-10-CM | POA: Diagnosis present

## 2018-03-31 DIAGNOSIS — Z86711 Personal history of pulmonary embolism: Secondary | ICD-10-CM

## 2018-03-31 DIAGNOSIS — G4700 Insomnia, unspecified: Secondary | ICD-10-CM | POA: Diagnosis present

## 2018-03-31 DIAGNOSIS — N183 Chronic kidney disease, stage 3 (moderate): Secondary | ICD-10-CM | POA: Diagnosis present

## 2018-03-31 DIAGNOSIS — F419 Anxiety disorder, unspecified: Secondary | ICD-10-CM | POA: Diagnosis present

## 2018-03-31 DIAGNOSIS — R7303 Prediabetes: Secondary | ICD-10-CM | POA: Diagnosis present

## 2018-03-31 DIAGNOSIS — F329 Major depressive disorder, single episode, unspecified: Secondary | ICD-10-CM | POA: Diagnosis present

## 2018-03-31 DIAGNOSIS — Z7982 Long term (current) use of aspirin: Secondary | ICD-10-CM

## 2018-03-31 DIAGNOSIS — Y92234 Operating room of hospital as the place of occurrence of the external cause: Secondary | ICD-10-CM | POA: Diagnosis not present

## 2018-03-31 DIAGNOSIS — F17210 Nicotine dependence, cigarettes, uncomplicated: Secondary | ICD-10-CM | POA: Diagnosis present

## 2018-03-31 DIAGNOSIS — Z9189 Other specified personal risk factors, not elsewhere classified: Secondary | ICD-10-CM

## 2018-03-31 DIAGNOSIS — Z8673 Personal history of transient ischemic attack (TIA), and cerebral infarction without residual deficits: Secondary | ICD-10-CM

## 2018-03-31 DIAGNOSIS — K589 Irritable bowel syndrome without diarrhea: Secondary | ICD-10-CM | POA: Diagnosis present

## 2018-03-31 DIAGNOSIS — I711 Thoracic aortic aneurysm, ruptured: Secondary | ICD-10-CM | POA: Diagnosis present

## 2018-03-31 DIAGNOSIS — E785 Hyperlipidemia, unspecified: Secondary | ICD-10-CM | POA: Diagnosis present

## 2018-03-31 DIAGNOSIS — K219 Gastro-esophageal reflux disease without esophagitis: Secondary | ICD-10-CM | POA: Diagnosis present

## 2018-03-31 DIAGNOSIS — I255 Ischemic cardiomyopathy: Secondary | ICD-10-CM | POA: Diagnosis present

## 2018-03-31 DIAGNOSIS — J449 Chronic obstructive pulmonary disease, unspecified: Secondary | ICD-10-CM | POA: Diagnosis present

## 2018-03-31 DIAGNOSIS — H9192 Unspecified hearing loss, left ear: Secondary | ICD-10-CM | POA: Diagnosis present

## 2018-03-31 DIAGNOSIS — M1990 Unspecified osteoarthritis, unspecified site: Secondary | ICD-10-CM | POA: Diagnosis present

## 2018-03-31 DIAGNOSIS — I2510 Atherosclerotic heart disease of native coronary artery without angina pectoris: Secondary | ICD-10-CM | POA: Diagnosis present

## 2018-03-31 DIAGNOSIS — Y838 Other surgical procedures as the cause of abnormal reaction of the patient, or of later complication, without mention of misadventure at the time of the procedure: Secondary | ICD-10-CM | POA: Diagnosis not present

## 2018-03-31 DIAGNOSIS — I712 Thoracic aortic aneurysm, without rupture, unspecified: Secondary | ICD-10-CM | POA: Diagnosis present

## 2018-03-31 DIAGNOSIS — Z882 Allergy status to sulfonamides status: Secondary | ICD-10-CM

## 2018-03-31 DIAGNOSIS — Z79899 Other long term (current) drug therapy: Secondary | ICD-10-CM

## 2018-03-31 DIAGNOSIS — Z7951 Long term (current) use of inhaled steroids: Secondary | ICD-10-CM

## 2018-03-31 DIAGNOSIS — I252 Old myocardial infarction: Secondary | ICD-10-CM

## 2018-03-31 DIAGNOSIS — Z79890 Hormone replacement therapy (postmenopausal): Secondary | ICD-10-CM

## 2018-03-31 DIAGNOSIS — I5022 Chronic systolic (congestive) heart failure: Secondary | ICD-10-CM | POA: Diagnosis present

## 2018-03-31 DIAGNOSIS — I739 Peripheral vascular disease, unspecified: Secondary | ICD-10-CM | POA: Diagnosis present

## 2018-03-31 DIAGNOSIS — Z86010 Personal history of colonic polyps: Secondary | ICD-10-CM

## 2018-03-31 DIAGNOSIS — Z95810 Presence of automatic (implantable) cardiac defibrillator: Secondary | ICD-10-CM

## 2018-03-31 HISTORY — DX: Thoracic aortic aneurysm, without rupture, unspecified: I71.20

## 2018-03-31 HISTORY — PX: CAROTID-SUBCLAVIAN BYPASS GRAFT: SHX910

## 2018-03-31 HISTORY — PX: THORACIC AORTIC ENDOVASCULAR STENT GRAFT: SHX6112

## 2018-03-31 HISTORY — DX: Thoracic aortic aneurysm, without rupture: I71.2

## 2018-03-31 LAB — POCT I-STAT 7, (LYTES, BLD GAS, ICA,H+H)
ACID-BASE EXCESS: 1 mmol/L (ref 0.0–2.0)
Acid-base deficit: 7 mmol/L — ABNORMAL HIGH (ref 0.0–2.0)
Bicarbonate: 19.2 mmol/L — ABNORMAL LOW (ref 20.0–28.0)
Bicarbonate: 26.5 mmol/L (ref 20.0–28.0)
CALCIUM ION: 1.1 mmol/L — AB (ref 1.15–1.40)
Calcium, Ion: 1.17 mmol/L (ref 1.15–1.40)
HCT: 26 % — ABNORMAL LOW (ref 36.0–46.0)
HEMATOCRIT: 25 % — AB (ref 36.0–46.0)
HEMOGLOBIN: 8.8 g/dL — AB (ref 12.0–15.0)
Hemoglobin: 8.5 g/dL — ABNORMAL LOW (ref 12.0–15.0)
O2 Saturation: 100 %
O2 Saturation: 99 %
PH ART: 7.369 (ref 7.350–7.450)
PO2 ART: 213 mmHg — AB (ref 83.0–108.0)
POTASSIUM: 3 mmol/L — AB (ref 3.5–5.1)
Potassium: 3.2 mmol/L — ABNORMAL LOW (ref 3.5–5.1)
SODIUM: 140 mmol/L (ref 135–145)
SODIUM: 144 mmol/L (ref 135–145)
TCO2: 28 mmol/L (ref 22–32)
TCO2: 20 mmol/L — ABNORMAL LOW (ref 22–32)
pCO2 arterial: 38.6 mmHg (ref 32.0–48.0)
pCO2 arterial: 46 mmHg (ref 32.0–48.0)
pH, Arterial: 7.295 — ABNORMAL LOW (ref 7.350–7.450)
pO2, Arterial: 159 mmHg — ABNORMAL HIGH (ref 83.0–108.0)

## 2018-03-31 LAB — BASIC METABOLIC PANEL
ANION GAP: 10 (ref 5–15)
BUN: 17 mg/dL (ref 8–23)
CO2: 23 mmol/L (ref 22–32)
CREATININE: 1.3 mg/dL — AB (ref 0.44–1.00)
Calcium: 7.9 mg/dL — ABNORMAL LOW (ref 8.9–10.3)
Chloride: 110 mmol/L (ref 98–111)
GFR calc non Af Amer: 41 mL/min — ABNORMAL LOW (ref >60)
GFR, EST AFRICAN AMERICAN: 47 mL/min — AB (ref >60)
GLUCOSE: 146 mg/dL — AB (ref 70–99)
Potassium: 3.7 mmol/L (ref 3.5–5.1)
Sodium: 143 mmol/L (ref 135–145)

## 2018-03-31 LAB — POCT ACTIVATED CLOTTING TIME
ACTIVATED CLOTTING TIME: 197 s
Activated Clotting Time: 230 seconds
Activated Clotting Time: 257 seconds

## 2018-03-31 LAB — GLUCOSE, CAPILLARY: GLUCOSE-CAPILLARY: 104 mg/dL — AB (ref 70–99)

## 2018-03-31 LAB — CBC
HEMATOCRIT: 35.9 % — AB (ref 36.0–46.0)
HEMOGLOBIN: 11.5 g/dL — AB (ref 12.0–15.0)
MCH: 30.8 pg (ref 26.0–34.0)
MCHC: 32 g/dL (ref 30.0–36.0)
MCV: 96.2 fL (ref 78.0–100.0)
Platelets: 166 10*3/uL (ref 150–400)
RBC: 3.73 MIL/uL — ABNORMAL LOW (ref 3.87–5.11)
RDW: 18.8 % — ABNORMAL HIGH (ref 11.5–15.5)
WBC: 10.9 10*3/uL — ABNORMAL HIGH (ref 4.0–10.5)

## 2018-03-31 LAB — PROTIME-INR
INR: 1.07
Prothrombin Time: 13.8 seconds (ref 11.4–15.2)

## 2018-03-31 LAB — TYPE AND SCREEN
ABO/RH(D): A POS
ANTIBODY SCREEN: NEGATIVE

## 2018-03-31 LAB — MAGNESIUM: MAGNESIUM: 2 mg/dL (ref 1.7–2.4)

## 2018-03-31 LAB — APTT: APTT: 29 s (ref 24–36)

## 2018-03-31 SURGERY — INSERTION, ENDOVASCULAR STENT GRAFT, AORTA, THORACIC
Anesthesia: General

## 2018-03-31 MED ORDER — MORPHINE SULFATE (PF) 2 MG/ML IV SOLN: 2.0000 mg | INTRAVENOUS | Status: DC | PRN

## 2018-03-31 MED ORDER — HEPARIN SODIUM (PORCINE) 1000 UNIT/ML IJ SOLN
INTRAMUSCULAR | Status: DC | PRN
  Administered 2018-03-31: 8000 [IU] via INTRAVENOUS
  Administered 2018-03-31: 2000 [IU] via INTRAVENOUS

## 2018-03-31 MED ORDER — MAGNESIUM SULFATE 2 GM/50ML IV SOLN: 2.0000 g | Freq: Every day | INTRAVENOUS | Status: DC | PRN

## 2018-03-31 MED ORDER — SODIUM CHLORIDE 0.9 % IV SOLN
INTRAVENOUS | Status: DC | PRN
  Administered 2018-03-31 (×2): 500 mL

## 2018-03-31 MED ORDER — BISOPROLOL FUMARATE 5 MG PO TABS
5.0000 mg | ORAL_TABLET | Freq: Every day | ORAL | Status: DC
  Administered 2018-03-31 – 2018-04-01 (×2): 5 mg via ORAL
  Filled 2018-03-31 (×2): qty 1

## 2018-03-31 MED ORDER — EPINEPHRINE PF 1 MG/10ML IJ SOSY
PREFILLED_SYRINGE | INTRAMUSCULAR | Status: DC | PRN
  Administered 2018-03-31 (×2): 0.5 mg via INTRAVENOUS

## 2018-03-31 MED ORDER — ONDANSETRON HCL 4 MG/2ML IJ SOLN
4.0000 mg | INTRAMUSCULAR | Status: DC | PRN
  Administered 2018-03-31 (×2): 4 mg via INTRAVENOUS
  Filled 2018-03-31 (×2): qty 2

## 2018-03-31 MED ORDER — HEMOSTATIC AGENTS (NO CHARGE) OPTIME
TOPICAL | Status: DC | PRN
  Administered 2018-03-31: 1 via TOPICAL

## 2018-03-31 MED ORDER — DEXAMETHASONE SODIUM PHOSPHATE 10 MG/ML IJ SOLN
INTRAMUSCULAR | Status: DC | PRN
  Administered 2018-03-31: 10 mg via INTRAVENOUS
  Administered 2018-03-31: 4 mg via INTRAVENOUS

## 2018-03-31 MED ORDER — IODIXANOL 320 MG/ML IV SOLN
INTRAVENOUS | Status: DC | PRN
  Administered 2018-03-31: 150 mL via INTRAVENOUS

## 2018-03-31 MED ORDER — BISACODYL 10 MG RE SUPP: 10.0000 mg | Freq: Every day | RECTAL | Status: DC | PRN

## 2018-03-31 MED ORDER — PROPOFOL 10 MG/ML IV BOLUS
INTRAVENOUS | Status: DC | PRN
  Administered 2018-03-31: 100 mg via INTRAVENOUS

## 2018-03-31 MED ORDER — HEPARIN SODIUM (PORCINE) 5000 UNIT/ML IJ SOLN
5000.0000 [IU] | Freq: Three times a day (TID) | INTRAMUSCULAR | Status: DC
  Administered 2018-04-01: 5000 [IU] via SUBCUTANEOUS
  Filled 2018-03-31: qty 1

## 2018-03-31 MED ORDER — SODIUM CHLORIDE 0.9 % IV SOLN
INTRAVENOUS | Status: AC
  Filled 2018-03-31 (×2): qty 1.2

## 2018-03-31 MED ORDER — BISOPROLOL FUMARATE 5 MG PO TABS
5.0000 mg | ORAL_TABLET | Freq: Once | ORAL | Status: AC
  Administered 2018-03-31: 5 mg via ORAL
  Filled 2018-03-31: qty 1

## 2018-03-31 MED ORDER — FUROSEMIDE 20 MG PO TABS
20.0000 mg | ORAL_TABLET | Freq: Every day | ORAL | Status: DC
  Administered 2018-03-31 – 2018-04-01 (×2): 20 mg via ORAL
  Filled 2018-03-31 (×3): qty 1

## 2018-03-31 MED ORDER — LACTATED RINGERS IV SOLN
INTRAVENOUS | Status: DC
  Administered 2018-03-31: 10:00:00 via INTRAVENOUS

## 2018-03-31 MED ORDER — PROTAMINE SULFATE 10 MG/ML IV SOLN
INTRAVENOUS | Status: DC | PRN
  Administered 2018-03-31 (×5): 10 mg via INTRAVENOUS

## 2018-03-31 MED ORDER — ACETAMINOPHEN 325 MG PO TABS
325.0000 mg | ORAL_TABLET | ORAL | Status: DC | PRN
  Administered 2018-03-31: 650 mg via ORAL
  Administered 2018-03-31: 325 mg via ORAL
  Filled 2018-03-31: qty 2
  Filled 2018-03-31: qty 1

## 2018-03-31 MED ORDER — LABETALOL HCL 5 MG/ML IV SOLN: 10.0000 mg | INTRAVENOUS | Status: DC | PRN

## 2018-03-31 MED ORDER — PROPOFOL 10 MG/ML IV BOLUS
INTRAVENOUS | Status: AC
  Filled 2018-03-31: qty 20

## 2018-03-31 MED ORDER — ONDANSETRON HCL 4 MG/2ML IJ SOLN
INTRAMUSCULAR | Status: DC | PRN
  Administered 2018-03-31: 4 mg via INTRAVENOUS

## 2018-03-31 MED ORDER — CHLORHEXIDINE GLUCONATE 4 % EX LIQD: 60.0000 mL | Freq: Once | CUTANEOUS | Status: DC

## 2018-03-31 MED ORDER — LACTATED RINGERS IV SOLN
INTRAVENOUS | Status: DC | PRN
  Administered 2018-03-31 (×2): via INTRAVENOUS

## 2018-03-31 MED ORDER — ONDANSETRON HCL 4 MG/2ML IJ SOLN
4.0000 mg | Freq: Four times a day (QID) | INTRAMUSCULAR | Status: DC | PRN
  Administered 2018-03-31: 4 mg via INTRAVENOUS
  Filled 2018-03-31 (×2): qty 2

## 2018-03-31 MED ORDER — POTASSIUM CHLORIDE CRYS ER 20 MEQ PO TBCR: 20.0000 meq | EXTENDED_RELEASE_TABLET | Freq: Every day | ORAL | Status: DC | PRN

## 2018-03-31 MED ORDER — PHENYLEPHRINE 40 MCG/ML (10ML) SYRINGE FOR IV PUSH (FOR BLOOD PRESSURE SUPPORT)
PREFILLED_SYRINGE | INTRAVENOUS | Status: DC | PRN
  Administered 2018-03-31: 160 ug via INTRAVENOUS

## 2018-03-31 MED ORDER — ALBUTEROL SULFATE (2.5 MG/3ML) 0.083% IN NEBU: 3.0000 mL | INHALATION_SOLUTION | Freq: Four times a day (QID) | RESPIRATORY_TRACT | Status: DC | PRN

## 2018-03-31 MED ORDER — CHLORHEXIDINE GLUCONATE CLOTH 2 % EX PADS
6.0000 | MEDICATED_PAD | Freq: Every day | CUTANEOUS | Status: DC
  Administered 2018-03-31: 6 via TOPICAL

## 2018-03-31 MED ORDER — GUAIFENESIN-DM 100-10 MG/5ML PO SYRP: 15.0000 mL | ORAL_SOLUTION | ORAL | Status: DC | PRN

## 2018-03-31 MED ORDER — SODIUM CHLORIDE 0.9 % IV SOLN
INTRAVENOUS | Status: DC
  Administered 2018-03-31: 18:00:00 via INTRAVENOUS

## 2018-03-31 MED ORDER — PANTOPRAZOLE SODIUM 40 MG PO TBEC
40.0000 mg | DELAYED_RELEASE_TABLET | Freq: Every day | ORAL | Status: DC
  Administered 2018-03-31 – 2018-04-01 (×2): 40 mg via ORAL
  Filled 2018-03-31 (×2): qty 1

## 2018-03-31 MED ORDER — SODIUM CHLORIDE 0.9 % IV SOLN: 500.0000 mL | Freq: Once | INTRAVENOUS | Status: DC | PRN

## 2018-03-31 MED ORDER — ACETAMINOPHEN 325 MG RE SUPP: 325.0000 mg | RECTAL | Status: DC | PRN

## 2018-03-31 MED ORDER — SODIUM CHLORIDE 0.9 % IV SOLN
INTRAVENOUS | Status: DC
  Administered 2018-03-31: 15:00:00 via INTRAVENOUS

## 2018-03-31 MED ORDER — EPHEDRINE 5 MG/ML INJ
INTRAVENOUS | Status: AC
  Filled 2018-03-31: qty 10

## 2018-03-31 MED ORDER — FENTANYL CITRATE (PF) 100 MCG/2ML IJ SOLN
INTRAMUSCULAR | Status: AC
  Filled 2018-03-31: qty 2

## 2018-03-31 MED ORDER — LIDOCAINE HCL (PF) 1 % IJ SOLN
INTRAMUSCULAR | Status: AC
  Filled 2018-03-31: qty 30

## 2018-03-31 MED ORDER — 0.9 % SODIUM CHLORIDE (POUR BTL) OPTIME
TOPICAL | Status: DC | PRN
  Administered 2018-03-31: 2000 mL

## 2018-03-31 MED ORDER — METOPROLOL TARTRATE 5 MG/5ML IV SOLN: 2.0000 mg | INTRAVENOUS | Status: DC | PRN

## 2018-03-31 MED ORDER — MIDAZOLAM HCL 5 MG/5ML IJ SOLN
INTRAMUSCULAR | Status: DC | PRN
  Administered 2018-03-31: 2 mg via INTRAVENOUS

## 2018-03-31 MED ORDER — ALUM & MAG HYDROXIDE-SIMETH 200-200-20 MG/5ML PO SUSP: 15.0000 mL | ORAL | Status: DC | PRN

## 2018-03-31 MED ORDER — CEFAZOLIN SODIUM-DEXTROSE 2-4 GM/100ML-% IV SOLN
2.0000 g | Freq: Three times a day (TID) | INTRAVENOUS | Status: AC
  Administered 2018-03-31 – 2018-04-01 (×2): 2 g via INTRAVENOUS
  Filled 2018-03-31 (×2): qty 100

## 2018-03-31 MED ORDER — FENTANYL CITRATE (PF) 100 MCG/2ML IJ SOLN
25.0000 ug | INTRAMUSCULAR | Status: DC | PRN
  Administered 2018-03-31 (×2): 25 ug via INTRAVENOUS

## 2018-03-31 MED ORDER — FAMOTIDINE 20 MG PO TABS: 20.0000 mg | ORAL_TABLET | Freq: Two times a day (BID) | ORAL | Status: DC | PRN

## 2018-03-31 MED ORDER — ROSUVASTATIN CALCIUM 20 MG PO TABS
20.0000 mg | ORAL_TABLET | Freq: Every day | ORAL | Status: DC
  Administered 2018-03-31 – 2018-04-01 (×2): 20 mg via ORAL
  Filled 2018-03-31 (×2): qty 1

## 2018-03-31 MED ORDER — ONDANSETRON HCL 4 MG/2ML IJ SOLN
INTRAMUSCULAR | Status: AC
  Administered 2018-03-31: 4 mg via INTRAVENOUS
  Filled 2018-03-31: qty 2

## 2018-03-31 MED ORDER — POLYETHYLENE GLYCOL 3350 17 G PO PACK: 17.0000 g | PACK | Freq: Every day | ORAL | Status: DC | PRN

## 2018-03-31 MED ORDER — SODIUM CHLORIDE 0.9 % IV SOLN
INTRAVENOUS | Status: AC
  Filled 2018-03-31: qty 1.2

## 2018-03-31 MED ORDER — FENTANYL CITRATE (PF) 250 MCG/5ML IJ SOLN
INTRAMUSCULAR | Status: AC
  Filled 2018-03-31: qty 5

## 2018-03-31 MED ORDER — DICYCLOMINE HCL 10 MG PO CAPS
10.0000 mg | ORAL_CAPSULE | Freq: Three times a day (TID) | ORAL | Status: DC | PRN
  Filled 2018-03-31: qty 1

## 2018-03-31 MED ORDER — LIDOCAINE HCL (CARDIAC) PF 100 MG/5ML IV SOSY
PREFILLED_SYRINGE | INTRAVENOUS | Status: DC | PRN
  Administered 2018-03-31: 100 mg via INTRAVENOUS

## 2018-03-31 MED ORDER — FENTANYL CITRATE (PF) 100 MCG/2ML IJ SOLN
INTRAMUSCULAR | Status: DC | PRN
  Administered 2018-03-31: 100 ug via INTRAVENOUS
  Administered 2018-03-31 (×3): 50 ug via INTRAVENOUS

## 2018-03-31 MED ORDER — ROCURONIUM BROMIDE 50 MG/5ML IV SOSY
PREFILLED_SYRINGE | INTRAVENOUS | Status: AC
  Filled 2018-03-31: qty 5

## 2018-03-31 MED ORDER — POLYETHYLENE GLYCOL 3350 17 GM/SCOOP PO POWD
17.0000 g | Freq: Every day | ORAL | Status: DC | PRN
  Filled 2018-03-31: qty 255

## 2018-03-31 MED ORDER — CEFAZOLIN SODIUM-DEXTROSE 2-4 GM/100ML-% IV SOLN
2.0000 g | INTRAVENOUS | Status: AC
  Administered 2018-03-31: 2 g via INTRAVENOUS
  Filled 2018-03-31: qty 100

## 2018-03-31 MED ORDER — MIDAZOLAM HCL 2 MG/2ML IJ SOLN
INTRAMUSCULAR | Status: AC
  Filled 2018-03-31: qty 2

## 2018-03-31 MED ORDER — TOPIRAMATE 25 MG PO TABS
50.0000 mg | ORAL_TABLET | Freq: Every day | ORAL | Status: DC
  Administered 2018-03-31: 50 mg via ORAL
  Filled 2018-03-31: qty 2

## 2018-03-31 MED ORDER — SODIUM BICARBONATE 4.2 % IV SOLN
INTRAVENOUS | Status: DC | PRN
  Administered 2018-03-31 (×2): 50 meq via INTRAVENOUS

## 2018-03-31 MED ORDER — SUCCINYLCHOLINE CHLORIDE 200 MG/10ML IV SOSY
PREFILLED_SYRINGE | INTRAVENOUS | Status: AC
  Filled 2018-03-31: qty 10

## 2018-03-31 MED ORDER — OXYCODONE-ACETAMINOPHEN 5-325 MG PO TABS
1.0000 | ORAL_TABLET | ORAL | Status: DC | PRN
  Administered 2018-03-31 – 2018-04-01 (×2): 1 via ORAL
  Filled 2018-03-31 (×2): qty 1

## 2018-03-31 MED ORDER — DOCUSATE SODIUM 100 MG PO CAPS
100.0000 mg | ORAL_CAPSULE | Freq: Every day | ORAL | Status: DC
  Administered 2018-04-01: 100 mg via ORAL
  Filled 2018-03-31: qty 1

## 2018-03-31 MED ORDER — SODIUM CHLORIDE 0.9 % IV SOLN
INTRAVENOUS | Status: DC | PRN
  Administered 2018-03-31: 60 ug/min via INTRAVENOUS

## 2018-03-31 MED ORDER — ETOMIDATE 2 MG/ML IV SOLN
INTRAVENOUS | Status: DC | PRN
  Administered 2018-03-31: 14 mg via INTRAVENOUS

## 2018-03-31 MED ORDER — SUGAMMADEX SODIUM 200 MG/2ML IV SOLN
INTRAVENOUS | Status: DC | PRN
  Administered 2018-03-31: 200 mg via INTRAVENOUS

## 2018-03-31 MED ORDER — HYDRALAZINE HCL 20 MG/ML IJ SOLN: 5.0000 mg | INTRAMUSCULAR | Status: DC | PRN

## 2018-03-31 MED ORDER — ONDANSETRON HCL 4 MG/2ML IJ SOLN
INTRAMUSCULAR | Status: AC
  Filled 2018-03-31: qty 2

## 2018-03-31 MED ORDER — ROCURONIUM BROMIDE 10 MG/ML (PF) SYRINGE
PREFILLED_SYRINGE | INTRAVENOUS | Status: DC | PRN
  Administered 2018-03-31: 50 mg via INTRAVENOUS
  Administered 2018-03-31: 20 mg via INTRAVENOUS

## 2018-03-31 MED ORDER — AMITRIPTYLINE HCL 50 MG PO TABS
50.0000 mg | ORAL_TABLET | Freq: Every day | ORAL | Status: DC
  Administered 2018-03-31: 50 mg via ORAL
  Filled 2018-03-31: qty 1

## 2018-03-31 MED ORDER — PHENOL 1.4 % MT LIQD: 1.0000 | OROMUCOSAL | Status: DC | PRN

## 2018-03-31 MED ORDER — EPHEDRINE SULFATE-NACL 50-0.9 MG/10ML-% IV SOSY
PREFILLED_SYRINGE | INTRAVENOUS | Status: DC | PRN
  Administered 2018-03-31: 25 mg via INTRAVENOUS
  Administered 2018-03-31: 20 mg via INTRAVENOUS
  Administered 2018-03-31: 25 mg via INTRAVENOUS
  Administered 2018-03-31: 20 mg via INTRAVENOUS

## 2018-03-31 MED ORDER — ASPIRIN 81 MG PO CHEW
81.0000 mg | CHEWABLE_TABLET | Freq: Every morning | ORAL | Status: DC
  Administered 2018-04-01: 81 mg via ORAL
  Filled 2018-03-31: qty 1

## 2018-03-31 SURGICAL SUPPLY — 85 items
ADH SKN CLS APL DERMABOND .7 (GAUZE/BANDAGES/DRESSINGS) ×4
ATTRACTOMAT 16X20 MAGNETIC DRP (DRAPES) ×2 IMPLANT
BAG DECANTER FOR FLEXI CONT (MISCELLANEOUS) ×4 IMPLANT
BAG SNAP BAND KOVER 36X36 (MISCELLANEOUS) ×2 IMPLANT
BLADE SURG 15 STRL LF DISP TIS (BLADE) IMPLANT
BLADE SURG 15 STRL SS (BLADE) ×4
CANISTER SUCT 3000ML PPV (MISCELLANEOUS) ×4 IMPLANT
CATH ACCU-VU SIZ PIG 5F 100CM (CATHETERS) ×2 IMPLANT
CATH ROBINSON RED A/P 18FR (CATHETERS) ×4 IMPLANT
CATH SUCT 10FR WHISTLE TIP (CATHETERS) IMPLANT
CLIP VESOCCLUDE MED 24/CT (CLIP) ×4 IMPLANT
CLIP VESOCCLUDE SM WIDE 24/CT (CLIP) ×4 IMPLANT
COVER PROBE W GEL 5X96 (DRAPES) ×4 IMPLANT
CRADLE DONUT ADULT HEAD (MISCELLANEOUS) ×4 IMPLANT
DERMABOND ADVANCED (GAUZE/BANDAGES/DRESSINGS) ×4
DERMABOND ADVANCED .7 DNX12 (GAUZE/BANDAGES/DRESSINGS) ×2 IMPLANT
DEVICE CLOSURE PERCLS PRGLD 6F (VASCULAR PRODUCTS) IMPLANT
DRAIN CHANNEL 15F RND FF W/TCR (WOUND CARE) IMPLANT
DRAPE INCISE IOBAN 66X45 STRL (DRAPES) ×2 IMPLANT
DRAPE WARM FLUID 44X44 (DRAPE) ×2 IMPLANT
DRAPE ZERO GRAVITY STERILE (DRAPES) ×4 IMPLANT
DRSG TEGADERM 2-3/8X2-3/4 SM (GAUZE/BANDAGES/DRESSINGS) ×4 IMPLANT
DRYSEAL FLEXSHEATH 20FR 33CM (SHEATH) ×2
ELECT CAUTERY BLADE 6.4 (BLADE) ×4 IMPLANT
ELECT REM PT RETURN 9FT ADLT (ELECTROSURGICAL) ×8
ELECTRODE REM PT RTRN 9FT ADLT (ELECTROSURGICAL) ×4 IMPLANT
EVACUATOR SILICONE 100CC (DRAIN) IMPLANT
FELT TEFLON 1X6 (MISCELLANEOUS) ×2 IMPLANT
GAUZE SPONGE 2X2 8PLY STRL LF (GAUZE/BANDAGES/DRESSINGS) IMPLANT
GAUZE SPONGE 4X4 12PLY STRL (GAUZE/BANDAGES/DRESSINGS) ×4 IMPLANT
GLOVE BIOGEL PI IND STRL 7.5 (GLOVE) ×2 IMPLANT
GLOVE BIOGEL PI INDICATOR 7.5 (GLOVE) ×2
GLOVE SURG SS PI 7.5 STRL IVOR (GLOVE) ×4 IMPLANT
GOWN STRL REUS W/ TWL LRG LVL3 (GOWN DISPOSABLE) ×4 IMPLANT
GOWN STRL REUS W/ TWL XL LVL3 (GOWN DISPOSABLE) ×2 IMPLANT
GOWN STRL REUS W/TWL LRG LVL3 (GOWN DISPOSABLE) ×8
GOWN STRL REUS W/TWL XL LVL3 (GOWN DISPOSABLE) ×4
GRAFT BALLN CATH 65CM (STENTS) IMPLANT
HEMOSTAT SNOW SURGICEL 2X4 (HEMOSTASIS) IMPLANT
KIT BASIN OR (CUSTOM PROCEDURE TRAY) ×4 IMPLANT
KIT TURNOVER KIT B (KITS) ×4 IMPLANT
NDL PERC 18GX7CM (NEEDLE) ×2 IMPLANT
NEEDLE PERC 18GX7CM (NEEDLE) ×4 IMPLANT
NS IRRIG 1000ML POUR BTL (IV SOLUTION) ×8 IMPLANT
PACK CAROTID (CUSTOM PROCEDURE TRAY) ×4 IMPLANT
PACK ENDOVASCULAR (PACKS) ×4 IMPLANT
PAD ARMBOARD 7.5X6 YLW CONV (MISCELLANEOUS) ×8 IMPLANT
PENCIL BUTTON HOLSTER BLD 10FT (ELECTRODE) ×4 IMPLANT
PERCLOSE PROGLIDE 6F (VASCULAR PRODUCTS) ×12
PROTECTION STATION PRESSURIZED (MISCELLANEOUS) ×4
PUNCH AORTIC ROT 5.0MM RCL 50 (MISCELLANEOUS) ×2 IMPLANT
SEALANT SURG COSEAL 4ML (VASCULAR PRODUCTS) ×2 IMPLANT
SET MICROPUNCTURE 5F STIFF (MISCELLANEOUS) ×2 IMPLANT
SHEATH AVANTI 11CM 8FR (SHEATH) IMPLANT
SHEATH DRYSEAL FLEX 20FR 33CM (SHEATH) IMPLANT
SHEATH PINNACLE 8F 10CM (SHEATH) ×2 IMPLANT
SHIELD RADPAD SCOOP 12X17 (MISCELLANEOUS) ×10 IMPLANT
SHUNT CAROTID BYPASS 10 (VASCULAR PRODUCTS) IMPLANT
SHUNT CAROTID BYPASS 12 (VASCULAR PRODUCTS) IMPLANT
SLEEVE SURGEON STRL (DRAPES) ×2 IMPLANT
SPONGE GAUZE 2X2 STER 10/PKG (GAUZE/BANDAGES/DRESSINGS) ×2
STATION PROTECTION PRESSURIZED (MISCELLANEOUS) IMPLANT
STENT GRAFT BALLN CATH 65CM (STENTS)
STENT GRFT THORAC ACS 31X26X10 (Endovascular Graft) ×2 IMPLANT
STOPCOCK MORSE 400PSI 3WAY (MISCELLANEOUS) ×6 IMPLANT
SUT ETHILON 3 0 PS 1 (SUTURE) IMPLANT
SUT PROLENE 5 0 C 1 24 (SUTURE) ×6 IMPLANT
SUT PROLENE 6 0 BV (SUTURE) ×4 IMPLANT
SUT PROLENE 7 0 BV 1 (SUTURE) IMPLANT
SUT VIC AB 2-0 CT1 27 (SUTURE)
SUT VIC AB 2-0 CT1 36 (SUTURE) ×2 IMPLANT
SUT VIC AB 2-0 CT1 TAPERPNT 27 (SUTURE) IMPLANT
SUT VIC AB 3-0 SH 27 (SUTURE) ×4
SUT VIC AB 3-0 SH 27X BRD (SUTURE) ×2 IMPLANT
SUT VICRYL 4-0 PS2 18IN ABS (SUTURE) IMPLANT
SYR 30ML LL (SYRINGE) IMPLANT
SYR 50ML LL SCALE MARK (SYRINGE) ×4 IMPLANT
TOWEL GREEN STERILE (TOWEL DISPOSABLE) ×4 IMPLANT
TRAY FOLEY MTR SLVR 16FR STAT (SET/KITS/TRAYS/PACK) ×4 IMPLANT
TUBING HIGH PRESSURE 120CM (CONNECTOR) ×6 IMPLANT
WATER STERILE IRR 1000ML POUR (IV SOLUTION) ×4 IMPLANT
WIRE AMPLATZ SS-J .035X180CM (WIRE) ×2 IMPLANT
WIRE BENTSON .035X145CM (WIRE) ×2 IMPLANT
WIRE STIFF LUNDERQUIST 260CM (WIRE) ×2 IMPLANT
bard ptfe felt ×2 IMPLANT

## 2018-03-31 NOTE — Progress Notes (Signed)
[employer] Rep Dionne Milo here to turn AICD back on.

## 2018-03-31 NOTE — Progress Notes (Signed)
Dr Ermalene Postin notified of pt H&H results. No orders rcd.

## 2018-03-31 NOTE — Transfer of Care (Signed)
Immediate Anesthesia Transfer of Care Note  Patient: [name]  Procedure(s) Performed: THORACIC AORTIC ENDOVASCULAR STENT GRAFT (N/A ) LEFT SUBCLAVIAN ARTERY BYPASS GRAFT (Left )  Patient Location: PACU  Anesthesia Type:General  Level of Consciousness: awake, alert , oriented and patient cooperative  Airway & Oxygen Therapy: Patient Spontanous Breathing and Patient connected to nasal cannula oxygen  Post-op Assessment: Report given to RN, Post -op Vital signs reviewed and stable and Patient moving all extremities X 4  Post vital signs: Reviewed and stable  Last Vitals:  Vitals Value Taken Time  BP 113/73 03/31/2018  3:12 PM  Temp 36.1 C 03/31/2018  3:15 PM  Pulse 85 03/31/2018  3:21 PM  Resp 14 03/31/2018  3:21 PM  SpO2 94 % 03/31/2018  3:21 PM  Vitals shown include unvalidated device data.  Last Pain:  Vitals:   03/31/18 0931  TempSrc:   PainSc: 0-No pain         Complications: No apparent anesthesia complications

## 2018-03-31 NOTE — Interval H&P Note (Signed)
History and Physical Interval Note:  03/31/2018 9:43 AM  [name]  has presented today for surgery, with the diagnosis of PENETRATING ULCER THORACIC AORTA  The various methods of treatment have been discussed with the patient and family. After consideration of risks, benefits and other options for treatment, the patient has consented to  Procedure(s): THORACIC AORTIC ENDOVASCULAR STENT GRAFT (N/A) LEFT SUBCLAVIAN ARTERY BYPASS GRAFT (Left) as a surgical intervention .  The patient's history has been reviewed, patient examined, no change in status, stable for surgery.  I have reviewed the patient's chart and labs.  Questions were answered to the patient's satisfaction.     Annamarie Major

## 2018-03-31 NOTE — Progress Notes (Signed)
Contacted Windle Guard, [place], that Dr. Nyoka Cowden stated ICD need to be turned off/interrigated prior to surgery. He stated he would get someone to come asap.

## 2018-03-31 NOTE — Anesthesia Procedure Notes (Signed)
Arterial Line Insertion Start/End9/10/2017 9:45 AM, 03/31/2018 10:08 AM Performed by: Myna Bright, CRNA, CRNA  Patient location: Pre-op. Preanesthetic checklist: patient identified, IV checked, site marked, risks and benefits discussed, surgical consent, monitors and equipment checked and pre-op evaluation Lidocaine 1% used for infiltration Right, radial was placed Catheter size: 20 G Hand hygiene performed  and maximum sterile barriers used  Allen's test indicative of satisfactory collateral circulation Attempts: 2 Procedure performed without using ultrasound guided technique. Following insertion, dressing applied and Biopatch. Post procedure assessment: normal  Patient tolerated the procedure well with no immediate complications.

## 2018-03-31 NOTE — Op Note (Signed)
Patient name: [name] MRN: [id number] DOB: 04-03-1946 Sex: female  03/31/2018 Pre-operative Diagnosis: Thoracic aortic penetrating ulcer with aneurysmal change Post-operative diagnosis:  Same Surgeon:  Annamarie Major Assistants:  Leontine Locket Procedure:   #1: Left subclavian to left carotid artery transposition   #2: Ultrasound-guided access, right femoral artery   #3: Endovascular repair of thoracic aortic aneurysm with coverage of left subclavian artery   #4: Aortic arch angiogram   #5: Pelvic angiogram Anesthesia:  General Blood Loss:  See anesthesia record Specimens:  none  Findings:  Complete exclusion Devices Used:  Conformable GORE TAG 31x26x10 Indications: The patient has a large penetrating ulcer just beyond her left subclavian artery.  She comes in today for carotid subclavian transposition and endovascular repair.  The risks and benefits of the procedure were discussed extensively with the patient and family prior to the procedure.  Procedure:  The patient was identified in the holding area and taken to [place] [address]  The patient was then placed supine on the table. general anesthesia was administered.  The patient was prepped and draped in the usual sterile fashion.  A time out was called and antibiotics were administered.  A supraclavicular incision was made on the left side of the neck beginning at the midline extending laterally for 6 cm.  Cautery was used about subtenons tissue down the platysma muscle which was divided with cautery.  I then identified the internal jugular vein and the left common carotid artery.  The vagus nerve was seen coursing on the anterior medial side of the jugular vein.  This was fully mobilized to aid with exposure.  Care was taken to avoid damage to the vagus nerve.  I then fully mobilized the left common carotid artery.  Once it was fully mobilized, it was somewhat redundant.  It was disease-free.  I then proceeded with dissection  proximally in between the jugular vein and left carotid artery.  I identified multiple lymphatic branches which were divided between silk ties.  I also identified what I felt was the thoracic duct which was isolated and divided between silk ties.  I then identified the vertebral vein which was also divided between silk ties.  I was unable to identify the left subclavian artery.  This was circumferentially dissected free and encircled with a vessel loop.  I dissected proximally on the subclavian artery and identified the vertebral artery.  I then proceeded with more proximal exposure of the subclavian artery until I had adequate length exposed.  Once I was satisfied with the exposure, the patient was fully heparinized.  I then placed a Henley clamp on the proximal sub-clavian artery.  A DeBakey clamp was placed on the distal subclavian artery and a vessel loop was used to occlude the vertebral artery.  I then transected the subclavian artery.  The stump was oversewn with a 5-0 Prolene using a felt strip.  1 pledgeted repair suture was used for hemostasis.  Once the proximal subclavian artery was fully ligated, the Henley clamp was removed and there was good hemostasis.  I then trimmed the remaining portion of the subclavian artery.  This was disease-free.  I selected a spot on the posterior lateral wall of the left common carotid artery for the arteriotomy.  The common carotid artery was then occluded with baby Gregory clamps.  A #11 blade was used to make an arteriotomy.  A #5 punch was used to further create the arteriotomy.  I then performed a running  anastomosis between the left subclavian artery and the left carotid artery with a running 5-0 Prolene.  Prior to completion, the appropriate flushing maneuvers were performed.  The anastomosis was then completed and sequential declamping was performed.  There was good hemostasis at the anastomosis.  There was a brisk Doppler signal in the subclavian artery, vertebral  artery, and common carotid artery.  I then packed the wound with a gauze and I turned my attention towards the groin.  The right common femoral artery was then evaluated with ultrasound and found to be disease-free and widely patent.  A #11 blade was used to make a skin nick.  The right common femoral artery was then cannulated under ultrasound guidance with a micropuncture needle.  A 018 wire was advanced without resistance and the micropuncture sheath was placed.  The wire was removed and a 035 Bentson wire was inserted.  The micropuncture sheath was removed and an 8 [place] dilator was used to dilate the subcutaneous tract.  Pro-glide devices were deployed at the 11:00 and 1 o'clock position for pre-closure.  An 8 French sheath was placed.  Additional heparin was then given based on the ACT readings.  A pigtail catheter was advanced into the ascending aorta and a Lunderquist double core wire was placed.  The 8 French sheath was removed and a 20 [place] dry seal sheath was inserted.  The device was prepared on the back table.  This was a conformable Gore TAG 31x26x10 device.  It was advanced up into the aortic arch.  A second cannulation of the dry seal sheath was performed and a pigtail catheter was inserted up into the ascending aorta.  At this time the patient became severely hypotensive.  I was concerned that this could have been from a sheath injury and so a pelvic angiogram was performed through the sheath which did not show any extravasation.  Anesthesia continue to resuscitate the patient.  I looked back with fluoroscopy up into the aortic arch and noticed that the Lunderquist wire had gone through the aortic valve.  I postulated that this could have caused aortic insufficiency causing the hemodynamic response.  The wire was pulled back into the ascending aorta.  Over time, the patient's hemodynamics improved.  At this point, I performed an aortic arch angiogram and a left anterior oblique position at 50  degrees.  This located the left common carotid artery.  The device was then deployed landing at the level of the left carotid artery.  Completion imaging was then performed which showed successful exclusion of the penetrating ulcer and aneurysm, along with continued patency of the left common carotid artery as well as patency of the left subclavian artery and carotid subclavian transposition.  Once the delivery system was withdrawn, the image detector was brought down into the pelvis and as I removed the sheath a retrograde injection was performed into the iliac arteries that did not show any evidence of extravasation.  I then remove the sheath and closed the arteriotomy with securing of the 2 Pro-glide devices.  There was still bleeding from the groin and so a third Pro-glide device was deployed.  After this there was still some bleeding and so the wire was removed and manual pressure was held for hemostasis.  Attention was then turned back towards the neck incision.  This was hemostatic.  There was some milky appearing fluid and so I explored the wound and found what was felt to be a draining lymphatic which was ligated with  metal clips.  I then observe the wound for several more minutes and no further lymphatic drainage was identified.  I placed CoSeal into the wound.  This appeared to be hemostatic.  The wound was irrigated.  I then reapproximated the sternal head of the sternocleidomastoid which was partially divided for exposure.  This was done with 2-0 Vicryl.  I then closed the platysma muscle with a layer 3-0 Vicryl and the skin was closed with 3-0 Vicryl followed by Dermabond.  I then looked back at the groin which was hemostatic.  This was covered with Dermabond.  The patient had brisk Doppler signals in both feet.  She was given a unit of blood because of a low hemoglobin around 8.5.  After she had received her blood, she was successfully extubated.  She is moving all extremities to command.  She was  hemodynamically stable.  She was taken to recovery room.  There were no immediate complications.   Disposition: To PACU stable   V. Annamarie Major, M.D. [facility] Office: [phone] Pager:  [phone]

## 2018-03-31 NOTE — Anesthesia Postprocedure Evaluation (Signed)
Anesthesia Post Note  Patient: [name]  Procedure(s) Performed: THORACIC AORTIC ENDOVASCULAR STENT GRAFT (N/A ) LEFT SUBCLAVIAN ARTERY BYPASS GRAFT (Left )     Patient location during evaluation: PACU Anesthesia Type: General Level of consciousness: awake Pain management: pain level controlled Vital Signs Assessment: post-procedure vital signs reviewed and stable Respiratory status: spontaneous breathing Cardiovascular status: stable Postop Assessment: no apparent nausea or vomiting Anesthetic complications: no    Last Vitals:  Vitals:   03/31/18 0955 03/31/18 1515  BP:    Pulse: 72   Resp:    Temp:  (!) 36.1 C  SpO2:      Last Pain:  Vitals:   03/31/18 0931  TempSrc:   PainSc: 0-No pain                 Aneliese Beaudry

## 2018-03-31 NOTE — Anesthesia Preprocedure Evaluation (Addendum)
Anesthesia Evaluation  Patient identified by MRN, date of birth, ID band Patient awake    Reviewed: Allergy & Precautions, NPO status , Patient's Chart, lab work & pertinent test results  History of Anesthesia Complications (+) PONV  Airway Mallampati: II  TM Distance: >3 FB     Dental  (+) Edentulous Upper, Partial Lower, Dental Advisory Given   Pulmonary shortness of breath, asthma , pneumonia, COPD,  COPD inhaler, Current Smoker,    breath sounds clear to auscultation       Cardiovascular hypertension, Pt. on medications and Pt. on home beta blockers + CAD, + Past MI, + Peripheral Vascular Disease and +CHF  + Cardiac Defibrillator  Rhythm:Regular Rate:Normal     Neuro/Psych  Headaches, Anxiety Depression CVA, No Residual Symptoms    GI/Hepatic Neg liver ROS, GERD  Medicated and Controlled,  Endo/Other    Renal/GU Renal InsufficiencyRenal diseasenegative Renal ROS     Musculoskeletal  (+) Arthritis , Osteoarthritis,    Abdominal   Peds  Hematology  (+) anemia ,   Anesthesia Other Findings   Reproductive/Obstetrics                            Anesthesia Physical Anesthesia Plan  ASA: III  Anesthesia Plan: General   Post-op Pain Management:    Induction: Intravenous  PONV Risk Score and Plan: Ondansetron, Dexamethasone, Midazolam and Treatment may vary due to age or medical condition  Airway Management Planned: Oral ETT  Additional Equipment: Arterial line  Intra-op Plan:   Post-operative Plan: Possible Post-op intubation/ventilation  Informed Consent: I have reviewed the patients History and Physical, chart, labs and discussed the procedure including the risks, benefits and alternatives for the proposed anesthesia with the patient or authorized representative who has indicated his/her understanding and acceptance.   Dental advisory given  Plan Discussed with: CRNA,  Anesthesiologist and Surgeon  Anesthesia Plan Comments:       Anesthesia Quick Evaluation

## 2018-03-31 NOTE — Anesthesia Procedure Notes (Signed)
Procedure Name: Intubation Date/Time: 03/31/2018 10:45 AM Performed by: Carney Living, CRNA Pre-anesthesia Checklist: Patient identified, Emergency Drugs available, Suction available, Patient being monitored and Timeout performed Patient Re-evaluated:Patient Re-evaluated prior to induction Oxygen Delivery Method: Circle system utilized Preoxygenation: Pre-oxygenation with 100% oxygen Induction Type: IV induction Ventilation: Mask ventilation without difficulty Laryngoscope Size: Mac and 4 Grade View: Grade I Tube type: Oral Number of attempts: 1 Airway Equipment and Method: Stylet Placement Confirmation: ETT inserted through vocal cords under direct vision,  positive ETCO2 and breath sounds checked- equal and bilateral Secured at: 22 cm Tube secured with: Tape Dental Injury: Teeth and Oropharynx as per pre-operative assessment

## 2018-03-31 NOTE — Progress Notes (Signed)
Labs drawn from A line and sent to lab. Notified lab of need for results STAT vs Routine.

## 2018-04-01 ENCOUNTER — Other Ambulatory Visit: Payer: Self-pay

## 2018-04-01 LAB — BASIC METABOLIC PANEL
Anion gap: 8 (ref 5–15)
BUN: 19 mg/dL (ref 8–23)
CALCIUM: 8 mg/dL — AB (ref 8.9–10.3)
CO2: 22 mmol/L (ref 22–32)
CREATININE: 1.37 mg/dL — AB (ref 0.44–1.00)
Chloride: 109 mmol/L (ref 98–111)
GFR calc Af Amer: 44 mL/min — ABNORMAL LOW (ref >60)
GFR calc non Af Amer: 38 mL/min — ABNORMAL LOW (ref >60)
GLUCOSE: 122 mg/dL — AB (ref 70–99)
Potassium: 4.1 mmol/L (ref 3.5–5.1)
Sodium: 139 mmol/L (ref 135–145)

## 2018-04-01 LAB — CBC
HEMATOCRIT: 33 % — AB (ref 36.0–46.0)
Hemoglobin: 10.8 g/dL — ABNORMAL LOW (ref 12.0–15.0)
MCH: 31.3 pg (ref 26.0–34.0)
MCHC: 32.7 g/dL (ref 30.0–36.0)
MCV: 95.7 fL (ref 78.0–100.0)
Platelets: 150 10*3/uL (ref 150–400)
RBC: 3.45 MIL/uL — ABNORMAL LOW (ref 3.87–5.11)
RDW: 19.4 % — AB (ref 11.5–15.5)
WBC: 8 10*3/uL (ref 4.0–10.5)

## 2018-04-01 MED ORDER — OXYCODONE-ACETAMINOPHEN 5-325 MG PO TABS: 1.0000 | ORAL_TABLET | Freq: Four times a day (QID) | ORAL | 0 refills | Status: DC | PRN

## 2018-04-01 MED ORDER — SACUBITRIL-VALSARTAN 24-26 MG PO TABS
1.0000 | ORAL_TABLET | Freq: Two times a day (BID) | ORAL | Status: DC
  Administered 2018-04-01: 1 via ORAL
  Filled 2018-04-01: qty 1

## 2018-04-01 NOTE — Progress Notes (Signed)
Patient's discharge instructions reviewed with patient and all questions answered, patient verbalized understanding in her own words.  All IV's removed and patient's personal belongings returned to her.

## 2018-04-01 NOTE — Discharge Instructions (Signed)
[facility]   Discharge Instructions  Endovascular Aortic Aneurysm Repair  Please refer to the following instructions for your post-procedure care. Your surgeon or Physician Assistant will discuss any changes with you.  Activity  You are encouraged to walk as much as you can. You can slowly return to normal activities but must avoid strenuous activity and heavy lifting until your doctor tells you it's OK. Avoid activities such as vacuuming or swinging a gold club. It is normal to feel tired for several weeks after your surgery. Do not drive until your doctor gives the OK and you are no longer taking prescription pain medications. It is also normal to have difficulty with sleep habits, eating, and bowel movements after surgery. These will go away with time.  Bathing/Showering  Shower daily after you go home.  Do not soak in a bathtub, hot tub, or swim until the incision heals completely.  If you have incisions in your groin, wash the groin wounds with soap and water daily and pat dry. (No tub bath-only shower)  Then put a dry gauze or washcloth there to keep this area dry to help prevent wound infection daily and as needed.  Do not use Vaseline or neosporin on your incisions.  Only use soap and water on your incisions and then protect and keep dry.  Incision Care  Shower every day. Clean your incision with mild soap and water. Pat the area dry with a clean towel. You do not need a bandage unless otherwise instructed. Do not apply any ointments or creams to your incision. If you clothing is irritating, you may cover your incision with a dry gauze pad.  Diet  Resume your normal diet. There are no special food restrictions following this procedure. A low fat/low cholesterol diet is recommended for all patients with vascular disease. In order to heal from your surgery, it is CRITICAL to get adequate nutrition. Your body requires vitamins, minerals, and protein.  Vegetables are the best source of vitamins and minerals. Vegetables also provide the perfect balance of protein. Processed food has little nutritional value, so try to avoid this.  Medications  Resume taking all of your medications unless your doctor or nurse practitioner tells you not to. If your incision is causing pain, you may take over-the-counter pain relievers such as acetaminophen (Tylenol). If you were prescribed a stronger pain medication, please be aware these medications can cause nausea and constipation. Prevent nausea by taking the medication with a snack or meal. Avoid constipation by drinking plenty of fluids and eating foods with a high amount of fiber, such as fruits, vegetables, and grains.  Do not take Tylenol if you are taking prescription pain medications.   Follow up  [place] office will schedule a follow-up appointment with a CT scan 3-4 weeks after your surgery.  Please call us immediately for any of the following conditions  Severe or worsening pain in your legs or feet or in your abdomen back or chest. Increased pain, redness, drainage (pus) from your incision site. Increased abdominal pain, bloating, nausea, vomiting or persistent diarrhea. Fever of 101 degrees or higher. Swelling in your leg (s),  Reduce your risk of vascular disease  Stop smoking. If you would like help call [facility] at 1-800-QUIT-NOW (561)746-0898) or [place] at [phone]. Manage your cholesterol Maintain a desired weight Control your diabetes Keep your blood pressure down  If you have questions, please call the office at [phone].   Discharge Instructions  Carotid Surgery  Please refer to the following instructions for your post-procedure care. Your surgeon or physician assistant will discuss any changes with you.  Activity  You are encouraged to walk as much as you can. You can slowly return to normal activities but must avoid strenuous activity and heavy  lifting until your doctor tell you it's okay. Avoid activities such as vacuuming or swinging a golf club. You can drive after one week if you are comfortable and you are no longer taking prescription pain medications. It is normal to feel tired for serval weeks after your surgery. It is also normal to have difficulty with sleep habits, eating, and bowel movements after surgery. These will go away with time.  Bathing/Showering  Shower daily after you go home. Do not soak in a bathtub, hot tub, or swim until the incision heals completely.  Incision Care  Shower every day. Clean your incision with mild soap and water. Pat the area dry with a clean towel. You do not need a bandage unless otherwise instructed. Do not apply any ointments or creams to your incision. You may have skin glue on your incision. Do not peel it off. It will come off on its own in about one week. Your incision may feel thickened and raised for several weeks after your surgery. This is normal and the skin will soften over time.   For Men Only: It's okay to shave around the incision but do not shave the incision itself for 2 weeks. It is common to have numbness under your chin that could last for several months.  Diet  Resume your normal diet. There are no special food restrictions following this procedure. A low fat/low cholesterol diet is recommended for all patients with vascular disease. In order to heal from your surgery, it is CRITICAL to get adequate nutrition. Your body requires vitamins, minerals, and protein. Vegetables are the best source of vitamins and minerals. Vegetables also provide the perfect balance of protein. Processed food has little nutritional value, so try to avoid this.  Medications  Resume taking all of your medications unless your doctor or physician assistant tells you not to. If your incision is causing pain, you may take over-the- counter pain relievers such as acetaminophen (Tylenol). If you were  prescribed a stronger pain medication, please be aware these medications can cause nausea and constipation. Prevent nausea by taking the medication with a snack or meal. Avoid constipation by drinking plenty of fluids and eating foods with a high amount of fiber, such as fruits, vegetables, and grains.  Do not take Tylenol if you are taking prescription pain medications.  Follow Up  Our office will schedule a follow up appointment 2-3 weeks following discharge.  Please call us immediately for any of the following conditions   Increased pain, redness, drainage (pus) from your incision site.  Fever of 101 degrees or higher.  If you should develop stroke (slurred speech, difficulty swallowing, weakness on one side of your body, loss of vision) you should call 911 and go to the nearest emergency room.   Reduce your risk of vascular disease:   Stop smoking. If you would like help call [facility] at 1-800-QUIT-NOW 5636687241) or [place] at (680) [phone].  Manage your cholesterol  Maintain a desired weight  Control your diabetes  Keep your blood pressure down   If you have any questions, please call the office at [phone].

## 2018-04-01 NOTE — Discharge Summary (Signed)
[facility] Discharge Summary   [name] Jan 15, 1946 72 y.o. female  MRN: [id number]  Admission Date: 03/31/2018  Discharge Date: 04/01/18  Physician: Serafina Mitchell, MD  Admission Diagnosis: PENETRATING ULCER THORACIC AORTA   HPI:   This is a 72 y.o. female who returns after having gotten a CT scan to evaluate her thoracic aortic penetrating ulcer. She is pain-free. The patient has a history of ischemic cardiomyopathy. Her ejection fraction is around 25%. I had evaluated her for possible Barostimtherapy in the past. She has a history of PCI in the past. She is chest pain-free. She feels like she is doing very well. She has an ICD in place. She takes a statin for hypercholesterolemia. She has stage III renal insufficiency.  Hospital Course:  The patient was admitted to the hospital and taken to the operating room on 03/31/2018 and underwent: Procedure:   #1: Left subclavian to left carotid artery transposition                         #2: Ultrasound-guided access, right femoral artery                         #3: Endovascular repair of thoracic aortic aneurysm with coverage of left subclavian artery                         #4: Aortic arch angiogram                         #5: Pelvic angiogram   Findings:  Complete exclusion  The pt tolerated the procedure well and was transported to the PACU in good condition.  She was kept in ICU overnight due to intraoperative hypotension, which resolved prior to leaving the OR.   By POD 1, she was doing well with brisk doppler signals in both feet as well as left radial/ulnar signals (palpable left radial pulse).  Her creatinine was stable and her [name] was restarted.  Her foley was removed and she was able to void without difficulty.  She was discharged later that day.   The remainder of the hospital course consisted of increasing mobilization and increasing intake of solids without difficulty.  CBC    Component Value Date/Time     WBC 8.0 04/01/2018 0321   RBC 3.45 (L) 04/01/2018 0321   HGB 10.8 (L) 04/01/2018 0321   HGB 12.2 06/08/2017 1230   HCT 33.0 (L) 04/01/2018 0321   HCT 36.5 06/08/2017 1230   PLT 150 04/01/2018 0321   PLT 232 06/08/2017 1230   MCV 95.7 04/01/2018 0321   MCV 97 06/08/2017 1230   MCH 31.3 04/01/2018 0321   MCHC 32.7 04/01/2018 0321   RDW 19.4 (H) 04/01/2018 0321   RDW 14.5 06/08/2017 1230   LYMPHSABS 3.8 (H) 06/08/2017 1230   MONOABS 0.4 04/09/2015 1051   EOSABS 0.1 06/08/2017 1230   BASOSABS 0.0 06/08/2017 1230    BMET    Component Value Date/Time   NA 139 04/01/2018 0321   NA 145 (H) 12/18/2017 1444   K 4.1 04/01/2018 0321   CL 109 04/01/2018 0321   CO2 22 04/01/2018 0321   GLUCOSE 122 (H) 04/01/2018 0321   BUN 19 04/01/2018 0321   BUN 23 12/18/2017 1444   CREATININE 1.37 (H) 04/01/2018 0321   CREATININE 1.56 (H) 01/15/2016 1345   CALCIUM 8.0 (L) 04/01/2018  0321   GFRNONAA 38 (L) 04/01/2018 0321   GFRAA 44 (L) 04/01/2018 0321       Discharge Instructions    Discharge patient   Complete by:  As directed    Discharge home if eating, walking and voiding okay.   Discharge disposition:  01-Home or Self Care   Discharge patient date:  04/01/2018      Discharge Diagnosis:  PENETRATING ULCER THORACIC AORTA  Secondary Diagnosis: Patient Active Problem List   Diagnosis Date Noted  . Thoracic aortic aneurysm ([place]) 03/31/2018  . Chronic renal insufficiency   . AICD (automatic cardioverter/defibrillator) present   . Chronic systolic CHF (congestive heart failure) ([place])   . COPD (chronic obstructive pulmonary disease) ([place])   . Hyperlipemia   . Insomnia   . Migraine headache without aura   . Pre-diabetes 06/19/2017  . Abnormal LFTs 06/19/2017  . Abnormal CT scan, stomach   . Gastric polyp   . Headache 01/03/2013  . 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 ICD-St.Jude 08/06/2011  . Chronic systolic heart failure ([place]) 08/05/2011  . Cardiomyopathy,  ischemic 06/12/2011  . DDD (degenerative disc disease), lumbar 05/16/2011  . History of pulmonary embolus (PE) 01/02/2011  . UNSPECIFIED ANEMIA 07/03/2010  . Essential hypertension 09/05/2009  . DIVERTICULOSIS-COLON 08/14/2009  . ABDOMINAL PAIN, LEFT LOWER QUADRANT 08/14/2009  . Hyperlipidemia, unspecified 04/24/2009  . CORONARY ARTERY DISEASE 04/24/2009  . DYSPHAGIA UNSPECIFIED 04/24/2009  . PERSONAL HX COLONIC POLYPS 04/24/2009  . TOBACCO ABUSE 04/04/2009  . CAD (coronary artery disease) 02/25/2009  . MASTOIDITIS 10/19/2007  . GERD 10/19/2007  . IBS 10/19/2007  . COLONIC POLYPS, ADENOMATOUS 05/12/2007   Past Medical History:  Diagnosis Date  . AICD (automatic cardioverter/defibrillator) present    Dr.Allred follows  . Aortic arch pseudoaneurysm (HCC)    a. followed by Dr. Prescott Gum.  . Arthritis   . Asthma   . Benign neoplasm of colon   . CAD (coronary artery disease) 02/2009   a. anterior STEMI rx with BMS to prox LAD in 02/2009. b. ISR s/p PTCA 06/2009. c. ISR s/p thrombectomy & PTCA 03/2010 due to late stent thrombosis.   . Chronic renal insufficiency    stage 3  . Chronic systolic CHF (congestive heart failure) ([place])    a. s/p ST. Jude ICD 2011-10-20.  . Colon polyp   . Complication of anesthesia   . COPD (chronic obstructive pulmonary disease) ([place])   . Depression    "since my son died in November 20, 2022" 10/19/17)  . Diverticulosis   . Dyspnea    "when I lay down at night"   . GERD (gastroesophageal reflux disease)   . GI bleed    a. h/o GIB on DAPT, now on ASA only.  Marland Kitchen Hearing loss    left ear  . Hyperlipemia   . Hypertension   . Insomnia   . Irritable bowel syndrome   . Ischemic cardiomyopathy    EF 10-15%  . Memory loss   . Migraine headache without aura   . Myocardial infarct [facility]) 10-19-2009 x 2   Dr. [place] Nickel   . Pneumonia   . PONV (postoperative nausea and vomiting)   . Pre-diabetes   . Pulmonary embolism ([place]) 10-19-2009  . Stroke [facility])    'When I was young." no  residual  . Transfusion history    ?'12 or '13  . Unspecified mastoiditis      Allergies as of 04/01/2018      Reactions   Lisinopril  cough   Sulfa Antibiotics Other (See Comments)   Unknown, childhood allergy    Sulfonamide Derivatives Other (See Comments)   UNSURE      Medication List    TAKE these medications   albuterol 108 (90 Base) MCG/ACT inhaler Commonly known as:  PROVENTIL HFA;VENTOLIN HFA Inhale 2 puffs into the lungs every 6 (six) hours as needed. For shortness of breath   amitriptyline 50 MG tablet Commonly known as:  ELAVIL Take 1 tablet (50 mg total) by mouth at bedtime.   aspirin 81 MG tablet Take 81 mg by mouth every morning.   bisoprolol 5 MG tablet Commonly known as:  ZEBETA TAKE 1 TABLET (5 MG TOTAL) BY MOUTH DAILY.   conjugated estrogens vaginal cream Commonly known as:  PREMARIN Apply 1 fingertip to outside of vagina twice a week What changed:    how much to take  how to take this  when to take this   DEXILANT 60 MG capsule Generic drug:  dexlansoprazole TAKE 1 CAPSULE BY MOUTH DAILY. What changed:  how much to take   dicyclomine 10 MG capsule Commonly known as:  BENTYL TAKE 1 CAPSULE BY MOUTH EVERY 8 HOURS AS NEEDED FOR SPASMS. What changed:  See the new instructions.   ENTRESTO 24-26 MG Generic drug:  sacubitril-valsartan TAKE 1 TABLET BY MOUTH 2 TIMES DAILY.   furosemide 20 MG tablet Commonly known as:  LASIX TAKE 1 TABLET BY MOUTH DAILY.   oxyCODONE-acetaminophen 5-325 MG tablet Commonly known as:  PERCOCET/ROXICET Take 1 tablet by mouth every 6 (six) hours as needed for moderate pain.   polyethylene glycol powder powder Commonly known as:  GLYCOLAX/MIRALAX Take 17 g by mouth daily as needed for mild constipation.   ranitidine 150 MG tablet Commonly known as:  ZANTAC TAKE 1 TABLET BY MOUTH 2 TIMES DAILY. What changed:    when to take this  reasons to take this   rosuvastatin 20 MG tablet Commonly known as:   CRESTOR TAKE 1 TABLET BY MOUTH AT BEDTIME. What changed:  when to take this   topiramate 50 MG tablet Commonly known as:  TOPAMAX Take 2 tablets (100 mg total) by mouth daily. What changed:    how much to take  when to take this       Discharge Instructions:  [facility]  Discharge Instructions Endovascular Aortic Aneurysm Repair  Please refer to the following instructions for your post-procedure care. Your surgeon or Physician Assistant will discuss any changes with you.  Activity  You are encouraged to walk as much as you can. You can slowly return to normal activities but must avoid strenuous activity and heavy lifting until your doctor tells you it's OK. Avoid activities such as vacuuming or swinging a gold club. It is normal to feel tired for several weeks after your surgery. Do not drive until your doctor gives the OK and you are no longer taking prescription pain medications. It is also normal to have difficulty with sleep habits, eating, and bowel movements after surgery. These will go away with time.  Bathing/Showering  You may shower after you go home. If you have an incision, do not soak in a bathtub, hot tub, or swim until the incision heals completely.  Incision Care  Shower every day. Clean your incision with mild soap and water. Pat the area dry with a clean towel. You do not need a bandage unless otherwise instructed. Do not apply any ointments or creams to  your incision. If you clothing is irritating, you may cover your incision with a dry gauze pad.  Diet  Resume your normal diet. There are no special food restrictions following this procedure. A low fat/low cholesterol diet is recommended for all patients with vascular disease. In order to heal from your surgery, it is CRITICAL to get adequate nutrition. Your body requires vitamins, minerals, and protein. Vegetables are the best source of vitamins and minerals. Vegetables also  provide the perfect balance of protein. Processed food has little nutritional value, so try to avoid this.  Medications  Resume taking all of your medications unless your doctor or Physician Assistnat tells you not to. If your incision is causing pain, you may take over-the-counter pain relievers such as acetaminophen (Tylenol). If you were prescribed a stronger pain medication, please be aware these medications can cause nausea and constipation. Prevent nausea by taking the medication with a snack or meal. Avoid constipation by drinking plenty of fluids and eating foods with a high amount of fiber, such as fruits, vegetables, and grains. Do not take Tylenol if you are taking prescription pain medications.   Follow up  [place] office will schedule a follow-up appointment with a C.T. scan 3-4 weeks after your surgery.  Please call us immediately for any of the following conditions  Severe or worsening pain in your legs or feet or in your abdomen back or chest. Increased pain, redness, drainage (pus) from your incision sit. Increased abdominal pain, bloating, nausea, vomiting or persistent diarrhea. Fever of 101 degrees or higher. Swelling in your leg (s),  Reduce your risk of vascular disease  .Stop smoking. If you would like help call [facility] at 1-800-QUIT-NOW 941-615-8535) or [place] at 4344600108. .Manage your cholesterol .Maintain a desired weight .Control your diabetes .Keep your blood pressure down  If you have questions, please call the office at [phone].   [facility]  Discharge Instructions  Carotid Surgery  Please refer to the following instructions for your post-procedure care. Your surgeon or physician assistant will discuss any changes with you.  Activity  You are encouraged to walk as much as you can. You can slowly return to normal activities but must avoid strenuous activity and heavy lifting until your doctor tell you  it's okay. Avoid activities such as vacuuming or swinging a golf club. You can drive after one week if you are comfortable and you are no longer taking prescription pain medications. It is normal to feel tired for serval weeks after your surgery. It is also normal to have difficulty with sleep habits, eating, and bowel movements after surgery. These will go away with time.  Bathing/Showering  Shower daily after you go home. Do not soak in a bathtub, hot tub, or swim until the incision heals completely.  Incision Care  Shower every day. Clean your incision with mild soap and water. Pat the area dry with a clean towel. You do not need a bandage unless otherwise instructed. Do not apply any ointments or creams to your incision. You may have skin glue on your incision. Do not peel it off. It will come off on its own in about one week. Your incision may feel thickened and raised for several weeks after your surgery. This is normal and the skin will soften over time.   For Men Only: It's okay to shave around the incision but do not shave the incision itself for 2 weeks. It is common to have numbness under your  chin that could last for several months.  Diet  Resume your normal diet. There are no special food restrictions following this procedure. A low fat/low cholesterol diet is recommended for all patients with vascular disease. In order to heal from your surgery, it is CRITICAL to get adequate nutrition. Your body requires vitamins, minerals, and protein. Vegetables are the best source of vitamins and minerals. Vegetables also provide the perfect balance of protein. Processed food has little nutritional value, so try to avoid this.  Medications  Resume taking all of your medications unless your doctor or physician assistant tells you not to. If your incision is causing pain, you may take over-the- counter pain relievers such as acetaminophen (Tylenol). If you were prescribed a stronger pain medication,  please be aware these medications can cause nausea and constipation. Prevent nausea by taking the medication with a snack or meal. Avoid constipation by drinking plenty of fluids and eating foods with a high amount of fiber, such as fruits, vegetables, and grains.  Do not take Tylenol if you are taking prescription pain medications.  Follow Up  Our office will schedule a follow up appointment 2-3 weeks following discharge.  Please call us immediately for any of the following conditions  . Increased pain, redness, drainage (pus) from your incision site. . Fever of 101 degrees or higher. . If you should develop stroke (slurred speech, difficulty swallowing, weakness on one side of your body, loss of vision) you should call 911 and go to the nearest emergency room. .  Reduce your risk of vascular disease:  . Stop smoking. If you would like help call [facility] at 1-800-QUIT-NOW 340-686-9012) or [place] at (330) 425-7890. . Manage your cholesterol . Maintain a desired weight . Control your diabetes . Keep your blood pressure down .  If you have any questions, please call the office at [phone].   Prescriptions given: Roxicet #20 No Refill  Disposition: home  Patient's condition: is Good  Follow up: 1. Dr. Trula Slade in 4 weeks with CTA protocol   Leontine Locket, PA-C [facility] [phone] 04/01/2018  9:00 AM   - For VQI Registry use - Post-op:  Time to Extubation: [x]  In OR, [ ]  < 12 hrs, [ ]  12-24 hrs, [ ]  >=24 hrs Vasopressors Req. Post-op: No MI: No., [ ]  Troponin only, [ ]  EKG or Clinical New Arrhythmia: No CHF: Yes  (present pre-op) ICU Stay: 1 day in ICU Transfusion: Yes     If yes, 1 unit given  Complications: Resp failure: No., [ ]  Pneumonia, [ ]  Ventilator Chg in renal function: No., [ ]  Inc. Cr > 0.5, [ ]  Temp. Dialysis,  [ ]  Permanent dialysis Leg ischemia: No., no Surgery needed, [ ]  Yes, Surgery needed,  [ ]  Amputation Bowel  ischemia: No., [ ]  Medical Rx, [ ]  Surgical Rx Wound complication: No., [ ]  Superficial separation/infection, [ ]  Return to OR Return to OR: No  Return to OR for bleeding: No Stroke: No., [ ]  Minor, [ ]  Major  Discharge medications: Statin use:  Yes  ASA use:  Yes  Plavix use:  No  Beta blocker use:  Yes  ARB use:  Yes ACEI use:  No CCB use:  No

## 2018-04-01 NOTE — Progress Notes (Signed)
Progress Note    04/01/2018 8:36 AM 1 Day Post-Op  Subjective:  Says she is having pain in her right groin  Afebrile HR 70's-90's NSR [id number]-[id number]'B systolic 01% [id number]  Vitals:   04/01/18 0600 04/01/18 0700  BP: 121/70 111/64  Pulse: 83 83  Resp: (!) 24 (!) 23  Temp:  98.4 F (36.9 C)  SpO2: 99% 96%    Physical Exam: Cardiac:  Regular Lungs:  Non labored Incisions:  Right groin is soft without hematoma; left neck incision is clean and dyr Extremities:  Brisk DP/PT doppler signals bilaterally; +palpable left radial pulse with good doppler signals; left hand warm and well perfused.  -pain. Abdomen:  Soft, NT/ND  CBC    Component Value Date/Time   WBC 8.0 04/01/2018 0321   RBC 3.45 (L) 04/01/2018 0321   HGB 10.8 (L) 04/01/2018 0321   HGB 12.2 06/08/2017 1230   HCT 33.0 (L) 04/01/2018 0321   HCT 36.5 06/08/2017 1230   PLT 150 04/01/2018 0321   PLT 232 06/08/2017 1230   MCV 95.7 04/01/2018 0321   MCV 97 06/08/2017 1230   MCH 31.3 04/01/2018 0321   MCHC 32.7 04/01/2018 0321   RDW 19.4 (H) 04/01/2018 0321   RDW 14.5 06/08/2017 1230   LYMPHSABS 3.8 (H) 06/08/2017 1230   MONOABS 0.4 04/09/2015 1051   EOSABS 0.1 06/08/2017 1230   BASOSABS 0.0 06/08/2017 1230    BMET    Component Value Date/Time   NA 139 04/01/2018 0321   NA 145 (H) 12/18/2017 1444   K 4.1 04/01/2018 0321   CL 109 04/01/2018 0321   CO2 22 04/01/2018 0321   GLUCOSE 122 (H) 04/01/2018 0321   BUN 19 04/01/2018 0321   BUN 23 12/18/2017 1444   CREATININE 1.37 (H) 04/01/2018 0321   CREATININE 1.56 (H) 01/15/2016 1345   CALCIUM 8.0 (L) 04/01/2018 0321   GFRNONAA 38 (L) 04/01/2018 0321   GFRAA 44 (L) 04/01/2018 0321    INR    Component Value Date/Time   INR 1.07 03/31/2018 1544   INR 3.0 05/10/2010 0000     Intake/Output Summary (Last 24 hours) at 04/01/2018 0836 Last data filed at 04/01/2018 0700 Gross per 24 hour  Intake 4094.31 ml  Output 1893 ml  Net 2201.31 ml     Assessment:  72  y.o. female is s/p:  Procedure:   #1: Left subclavian to left carotid artery transposition                         #2: Ultrasound-guided access, right femoral artery                         #3: Endovascular repair of thoracic aortic aneurysm with coverage of left subclavian artery                         #4: Aortic arch angiogram                         #5: Pelvic angiogram  1 Day Post-Op  Plan: -pt with brisk doppler signals bilateral feet as well as left radial/ulnar (palpable left radial).   -creatinine stable-will restart Entresto this am -foley removed at 0630-pt needs to void -pt to eat breakfast and walk -if pt comfortable walking and able to void, will discharge later today. -f/u with Dr. Trula Slade in 4 weeks with CTA  protocol.    Leontine Locket, PA-C [facility] [phone] 04/01/2018 8:36 AM

## 2018-04-02 ENCOUNTER — Encounter (HOSPITAL_COMMUNITY): Payer: Self-pay | Admitting: Surgery

## 2018-04-02 ENCOUNTER — Other Ambulatory Visit: Payer: Self-pay

## 2018-04-02 ENCOUNTER — Telehealth: Payer: Self-pay | Admitting: Surgery

## 2018-04-02 DIAGNOSIS — I712 Thoracic aortic aneurysm, without rupture, unspecified: Secondary | ICD-10-CM

## 2018-04-02 LAB — TYPE AND SCREEN
ABO/RH(D): A POS
Antibody Screen: NEGATIVE
DONOR AG TYPE: NEGATIVE
DONOR AG TYPE: NEGATIVE
UNIT DIVISION: 0
Unit division: 0

## 2018-04-02 LAB — BPAM RBC
BLOOD PRODUCT EXPIRATION DATE: 201909082359
BLOOD PRODUCT EXPIRATION DATE: 201909082359
ISSUE DATE / TIME: 201909041421
ISSUE DATE / TIME: 201909041421
UNIT TYPE AND RH: 5100
Unit Type and Rh: 5100

## 2018-04-02 NOTE — Telephone Encounter (Signed)
sch appt spk to pt 04/26/18 CTA @ 2pm 315pm f/u MD

## 2018-04-02 NOTE — Telephone Encounter (Signed)
Appointment made for follow up with Beat [facility]

## 2018-04-15 ENCOUNTER — Ambulatory Visit (INDEPENDENT_AMBULATORY_CARE_PROVIDER_SITE_OTHER): Payer: Medicare Other | Admitting: Physician Assistant

## 2018-04-15 ENCOUNTER — Encounter: Payer: Self-pay | Admitting: Physician Assistant

## 2018-04-15 ENCOUNTER — Other Ambulatory Visit: Payer: Self-pay

## 2018-04-15 ENCOUNTER — Telehealth: Payer: Self-pay | Admitting: *Deleted

## 2018-04-15 VITALS — BP 110/74 | HR 85 | Temp 98.2°F | Resp 16 | Ht 63.0 in | Wt 173.2 lb

## 2018-04-15 DIAGNOSIS — R0602 Shortness of breath: Secondary | ICD-10-CM | POA: Diagnosis not present

## 2018-04-15 LAB — POCT CBC
Granulocyte percent: 59.5 % (ref 37–80)
HCT, POC: 39.1 % (ref 37.7–47.9)
Hemoglobin: 12.9 g/dL (ref 12.2–16.2)
Lymph, poc: 1.7 (ref 0.6–3.4)
MCH, POC: 31.3 pg — AB (ref 27–31.2)
MCHC: 33.1 g/dL (ref 31.8–35.4)
MCV: 94.3 fL (ref 80–97)
MID (cbc): 1.7 — AB (ref 0–0.9)
MPV: 7.1 fL (ref 0–99.8)
POC Granulocyte: 5 (ref 2–6.9)
POC LYMPH PERCENT: 20.3 % (ref 10–50)
POC MID %: 20.2 % — AB (ref 0–12)
Platelet Count, POC: 367 K/uL (ref 142–424)
RBC: 4.14 M/uL (ref 4.04–5.48)
RDW, POC: 18 %
WBC: 8.4 K/uL (ref 4.6–10.2)

## 2018-04-15 LAB — POCT URINALYSIS DIP (MANUAL ENTRY)
Bilirubin, UA: NEGATIVE
Blood, UA: NEGATIVE
Glucose, UA: NEGATIVE mg/dL
Ketones, POC UA: NEGATIVE mg/dL
Leukocytes, UA: NEGATIVE
Nitrite, UA: NEGATIVE
Protein Ur, POC: NEGATIVE mg/dL
Spec Grav, UA: 1.015 (ref 1.010–1.025)
Urobilinogen, UA: 0.2 U/dL
pH, UA: 6 (ref 5.0–8.0)

## 2018-04-15 LAB — POC MICROSCOPIC URINALYSIS (UMFC): Mucus: ABSENT

## 2018-04-15 NOTE — Progress Notes (Signed)
[name]  MRN: [id number] DOB: 08-Apr-1946  PCP: Rutherford Guys, MD  Subjective:  Pt is a 72 year old female who presents to clinic for shob. She is here today with her son.   Endorses cough and shob on exertion. When she sits down she's fine.  Runny nose x several days.  She has taken Mucinex and Delsym.  Of note - surgery stent placed in on 03/31/18 s/p Aneursym of Aorta. She does not recall being sent home with incentive spirometry.  Wt Readings from Last 3 Encounters:  04/15/18 173 lb 3.2 oz (78.6 kg)  03/31/18 174 lb (78.9 kg)  03/26/18 174 lb 8 oz (79.2 kg)    Review of Systems  Constitutional: Negative for chills and fever.  HENT: Positive for congestion, postnasal drip and rhinorrhea. Negative for sinus pressure, sinus pain, sneezing and sore throat.   Respiratory: Positive for shortness of breath. Negative for cough, chest tightness and wheezing.   Cardiovascular: Negative for chest pain, palpitations and leg swelling.  Gastrointestinal: Negative for abdominal pain, nausea and vomiting.  Genitourinary: Negative for dysuria, flank pain, frequency and urgency.    Patient Active Problem List   Diagnosis Date Noted  . Thoracic aortic aneurysm ([place]) 03/31/2018  . Chronic renal insufficiency   . AICD (automatic cardioverter/defibrillator) present   . Chronic systolic CHF (congestive heart failure) ([place])   . COPD (chronic obstructive pulmonary disease) ([place])   . Hyperlipemia   . Insomnia   . Migraine headache without aura   . Pre-diabetes 06/19/2017  . Abnormal LFTs 06/19/2017  . Abnormal CT scan, stomach   . Gastric polyp   . Headache 01/03/2013  . Pseudoaneurysm of aortic arch ([place]) 05/05/2012  . ICD-St.Jude 08/06/2011  . Chronic systolic heart failure ([place]) 08/05/2011  . Cardiomyopathy, ischemic 06/12/2011  . DDD (degenerative disc disease), lumbar 05/16/2011  . History of pulmonary embolus (PE) 01/02/2011  . UNSPECIFIED ANEMIA 07/03/2010  . Essential  hypertension 09/05/2009  . DIVERTICULOSIS-COLON 08/14/2009  . ABDOMINAL PAIN, LEFT LOWER QUADRANT 08/14/2009  . Hyperlipidemia, unspecified 04/24/2009  . CORONARY ARTERY DISEASE 04/24/2009  . DYSPHAGIA UNSPECIFIED 04/24/2009  . PERSONAL HX COLONIC POLYPS 04/24/2009  . TOBACCO ABUSE 04/04/2009  . CAD (coronary artery disease) 02/25/2009  . MASTOIDITIS 10/19/2007  . GERD 10/19/2007  . IBS 10/19/2007  . COLONIC POLYPS, ADENOMATOUS 05/12/2007    Current Outpatient Medications on File Prior to Visit  Medication Sig Dispense Refill  . albuterol (PROAIR HFA) 108 (90 Base) MCG/ACT inhaler Inhale 2 puffs into the lungs every 6 (six) hours as needed. For shortness of breath 18 g 5  . amitriptyline (ELAVIL) 50 MG tablet Take 1 tablet (50 mg total) by mouth at bedtime. 90 tablet 0  . aspirin 81 MG tablet Take 81 mg by mouth every morning.     . bisoprolol (ZEBETA) 5 MG tablet TAKE 1 TABLET (5 MG TOTAL) BY MOUTH DAILY. 90 tablet 3  . conjugated estrogens (PREMARIN) vaginal cream Apply 1 fingertip to outside of vagina twice a week (Patient taking differently: Place 1 Applicatorful vaginally 2 (two) times a week. Apply 1 fingertip to outside of vagina twice a week) 30 g 12  . DEXILANT 60 MG capsule TAKE 1 CAPSULE BY MOUTH DAILY. (Patient taking differently: Take 60 mg by mouth daily. ) 30 capsule 3  . ENTRESTO 24-26 MG TAKE 1 TABLET BY MOUTH 2 TIMES DAILY. 180 tablet 1  . furosemide (LASIX) 20 MG tablet TAKE 1 TABLET BY MOUTH DAILY. (Patient  taking differently: Take 20 mg by mouth daily. ) 30 tablet 11  . polyethylene glycol powder (GLYCOLAX/MIRALAX) powder Take 17 g by mouth daily as needed for mild constipation. 255 g 11  . ranitidine (ZANTAC) 150 MG tablet TAKE 1 TABLET BY MOUTH 2 TIMES DAILY. (Patient taking differently: Take 150 mg by mouth 2 (two) times daily as needed for heartburn. ) 180 tablet 0  . rosuvastatin (CRESTOR) 20 MG tablet TAKE 1 TABLET BY MOUTH AT BEDTIME. (Patient taking  differently: Take 20 mg by mouth daily. ) 90 tablet 3  . topiramate (TOPAMAX) 50 MG tablet Take 2 tablets (100 mg total) by mouth daily. (Patient taking differently: Take 50 mg by mouth at bedtime. ) 90 tablet 0  . dicyclomine (BENTYL) 10 MG capsule TAKE 1 CAPSULE BY MOUTH EVERY 8 HOURS AS NEEDED FOR SPASMS. (Patient not taking: No sig reported) 30 capsule 3  . oxyCODONE-acetaminophen (PERCOCET/ROXICET) 5-325 MG tablet Take 1 tablet by mouth every 6 (six) hours as needed for moderate pain. (Patient not taking: Reported on 04/15/2018) 20 tablet 0   No current facility-administered medications on file prior to visit.     Allergies  Allergen Reactions  . Lisinopril     cough  . Sulfa Antibiotics Other (See Comments)    Unknown, childhood allergy   . Sulfonamide Derivatives Other (See Comments)    UNSURE     Objective:  BP 110/74 (BP Location: Left Arm, Patient Position: Sitting, Cuff Size: Normal)   Pulse 85   Temp 98.2 F (36.8 C) (Oral)   Resp 16   Ht 5\' 3"  (1.6 m)   Wt 173 lb 3.2 oz (78.6 kg)   SpO2 99%   BMI 30.68 kg/m   Physical Exam  Constitutional: She is oriented to person, place, and time. No distress.  Neck: Normal range of motion and full passive range of motion without pain.    Incision scar healing well. No warmth, drainage, erythema  Cardiovascular: Normal rate, regular rhythm, S1 normal, S2 normal, normal heart sounds, intact distal pulses and normal pulses. Exam reveals no gallop.  Pulmonary/Chest: Effort normal and breath sounds normal. She has no wheezes. She has no rhonchi. She has no rales.  Neurological: She is alert and oriented to person, place, and time.  Skin: Skin is warm and dry.  Psychiatric: Judgment normal.  Vitals reviewed.  Results for orders placed or performed in visit on 04/15/18  POCT urinalysis dipstick  Result Value Ref Range   Color, UA yellow yellow   Clarity, UA clear clear   Glucose, UA negative negative mg/dL   Bilirubin, UA  negative negative   Ketones, POC UA negative negative mg/dL   Spec Grav, UA 1.015 1.010 - 1.025   Blood, UA negative negative   pH, UA 6.0 5.0 - 8.0   Protein Ur, POC negative negative mg/dL   Urobilinogen, UA 0.2 0.2 or 1.0 E.U./dL   Nitrite, UA Negative Negative   Leukocytes, UA Negative Negative  POCT CBC  Result Value Ref Range   WBC 8.4 4.6 - 10.2 K/uL   Lymph, poc 1.7 0.6 - 3.4   POC LYMPH PERCENT 20.3 10 - 50 %L   MID (cbc) 1.7 (A) 0 - 0.9   POC MID % 20.2 (A) 0 - 12 %M   POC Granulocyte 5.0 2 - 6.9   Granulocyte percent 59.5 37 - 80 %G   RBC 4.14 4.04 - 5.48 M/uL   Hemoglobin 12.9 12.2 - 16.2 g/dL  HCT, POC 39.1 37.7 - 47.9 %   MCV 94.3 80 - 97 fL   MCH, POC 31.3 (A) 27 - 31.2 pg   MCHC 33.1 31.8 - 35.4 g/dL   RDW, POC 18.0 %   Platelet Count, POC 367 142 - 424 K/uL   MPV 7.1 0 - 99.8 fL    Assessment and Plan :  1. Shortness of breath - Pt presents c/o shob with exertion. NAD. UA and CBC are negative. BMP is pending. EKG is unchanged from previous tracings. Vitals are stable.  No x-ray is available today - will have her come back tomorrow for x-ray only. Pt had surgery 9/4 for thoracic aortic aneurism with no incentive spirometry. Advised deep breathing exercises at home.  F/u with PCP.  - POCT urinalysis dipstick - POCT Microscopic Urinalysis ([facility]) - Urine Culture - EKG [id number] - Basic Metabolic Panel - POCT CBC   Mercer Pod, PA-C  [facility] [place] 04/15/2018 4:00 PM  Please note: Portions of this report may have been transcribed using dragon voice recognition software. Every effort was made to ensure accuracy; however, inadvertent computerized transcription errors may be present.

## 2018-04-15 NOTE — Telephone Encounter (Signed)
Call from patient's son patient having "shortness of breath, cough and getting a cold". Instructed to go to the [facility] ER for evaluation. "well, it's not that bad" Informed him that a complaint of SOB we instruct patients to go to the ER. [facility] surgeons can be paged for consult if needed.

## 2018-04-15 NOTE — Patient Instructions (Addendum)
  Please come back anytime tomorrow to have your chest x-ray performed. You do not need an appointment for this.  I will contact you with the results and plan.   Your EKG looks unchanged from previous EKGs.  You do not have a urinary tract infection.  You are not anemic.   In the meantime, please make a follow-up appointment with your primary care provider in the next 2 weeks.   Thank you for coming in today. I hope you feel we met your needs.  Feel free to call PCP if you have any questions or further requests.  Please consider signing up for MyChart if you do not already have it, as this is a great way to communicate with me.  Best,  Whitney McVey, PA-C   IF you received an x-ray today, you will receive an invoice from [facility]. Please contact [facility] at [phone] with questions or concerns regarding your invoice.   IF you received labwork today, you will receive an invoice from [place]. Please contact [facility] at [phone] with questions or concerns regarding your invoice.   Our billing staff will not be able to assist you with questions regarding bills from these companies.  You will be contacted with the lab results as soon as they are available. The fastest way to get your results is to activate your My Chart account. Instructions are located on the last page of this paperwork. If you have not heard from [place] regarding the results in 2 weeks, please contact this office.

## 2018-04-16 ENCOUNTER — Ambulatory Visit (INDEPENDENT_AMBULATORY_CARE_PROVIDER_SITE_OTHER): Payer: Medicare Other | Admitting: Physician Assistant

## 2018-04-16 ENCOUNTER — Ambulatory Visit (INDEPENDENT_AMBULATORY_CARE_PROVIDER_SITE_OTHER): Payer: Medicare Other

## 2018-04-16 DIAGNOSIS — R0602 Shortness of breath: Secondary | ICD-10-CM | POA: Diagnosis not present

## 2018-04-16 LAB — BASIC METABOLIC PANEL
BUN/Creatinine Ratio: 17 (ref 12–28)
Creatinine, Ser: 1.49 mg/dL — ABNORMAL HIGH (ref 0.57–1.00)
GFR calc Af Amer: 41 mL/min/{1.73_m2} — ABNORMAL LOW (ref >59)
GFR calc non Af Amer: 35 mL/min/{1.73_m2} — ABNORMAL LOW (ref >59)
Sodium: 142 mmol/L (ref 134–144)

## 2018-04-16 LAB — BASIC METABOLIC PANEL WITH GFR
BUN: 25 mg/dL (ref 8–27)
CO2: 16 mmol/L — ABNORMAL LOW (ref 20–29)
Calcium: 9.4 mg/dL (ref 8.7–10.3)
Chloride: 108 mmol/L — ABNORMAL HIGH (ref 96–106)
Glucose: 90 mg/dL (ref 65–99)
Potassium: 4.9 mmol/L (ref 3.5–5.2)

## 2018-04-16 NOTE — Progress Notes (Signed)
Please call pt. Her x-ray looks great. No sign of a pneumonia or fluid in her lungs. Please practice on your deep breathing at least 2-3 times/day for the next week. This is very important, especially immediately following a surgery.  Please have her schedule an appointment with Dr. Pamella Pert in the next 2 weeks to make sure she is improving.  Thank ou!

## 2018-04-17 LAB — URINE CULTURE

## 2018-04-20 ENCOUNTER — Encounter: Payer: Self-pay | Admitting: Physician Assistant

## 2018-04-21 ENCOUNTER — Encounter: Payer: Self-pay | Admitting: Cardiothoracic Surgery

## 2018-04-21 ENCOUNTER — Other Ambulatory Visit: Payer: Self-pay

## 2018-04-21 ENCOUNTER — Ambulatory Visit (INDEPENDENT_AMBULATORY_CARE_PROVIDER_SITE_OTHER): Payer: Medicare Other | Admitting: Cardiothoracic Surgery

## 2018-04-21 ENCOUNTER — Encounter: Payer: Medicare Other | Admitting: *Deleted

## 2018-04-21 VITALS — BP 132/78 | HR 79 | Resp 18 | Ht 63.0 in | Wt 173.6 lb

## 2018-04-21 VITALS — BP 137/80 | HR 73 | Wt 173.0 lb

## 2018-04-21 DIAGNOSIS — I712 Thoracic aortic aneurysm, without rupture, unspecified: Secondary | ICD-10-CM

## 2018-04-21 DIAGNOSIS — Z006 Encounter for examination for normal comparison and control in clinical research program: Secondary | ICD-10-CM

## 2018-04-21 DIAGNOSIS — I7122 Aneurysm of the aortic arch, without rupture: Secondary | ICD-10-CM

## 2018-04-21 NOTE — Progress Notes (Signed)
PCP is Rutherford Guys, MD Referring Provider is Leamon Arnt, MD  Chief Complaint  Patient presents with  . Thoracic Aortic Aneurysm    3 month f/u    HPI: Patient returns for 61-month follow-up, scheduled.  Since her last visit she has undergone successful TEVAR stent grafting of a proximal descending thoracic pseudo-aneurysm by Dr. Trula Slade.  This was preceded by a left carotid to subclavian transposition because the left subclavian was covered.  The patient has done well.  No surgical pain.  No alert neurologic symptoms.  Good peripheral pulses.  She complains mainly now of shortness of breath with exertion as well as PND.  She has history of cardiomyopathy with ejection fraction 25%.  She presented to [place] [facility] for her shortness of breath earlier this month but no plan of care is apparent on the note.  She is followed by Dr. [name] for her heart failure which has been fairly well controlled with Entresto.  Her last echo was year and a half ago.  She is in sinus rhythm now.  Chest x-ray today shows no significant pleural effusion or overt heart failure.  I told the patient to double up on her Lasix from 20 mg to 40 mg daily and we will refer her back to Dr. Antionette Char office to assess her cardiac status-increased symptoms of heart failure.  Chest x-ray shows the stent graft to be in good position. Past Medical History:  Diagnosis Date  . AICD (automatic cardioverter/defibrillator) present    Dr.Allred follows  . Aortic arch pseudoaneurysm (HCC)    a. followed by Dr. Prescott Gum.  . Arthritis   . Asthma   . Benign neoplasm of colon   . CAD (coronary artery disease) 02/2009   a. anterior STEMI rx with BMS to prox LAD in 02/2009. b. ISR s/p PTCA 06/2009. c. ISR s/p thrombectomy & PTCA 03/2010 due to late stent thrombosis.   . Chronic renal insufficiency    stage 3  . Chronic systolic CHF (congestive heart failure) ([place])    a. s/p ST. Jude ICD 14-Nov-2011.  . Colon polyp   .  Complication of anesthesia   . COPD (chronic obstructive pulmonary disease) ([place])   . Depression    "since my son died in 12-15-2022" 11/13/2017)  . Diverticulosis   . Dyspnea    "when I lay down at night"   . GERD (gastroesophageal reflux disease)   . GI bleed    a. h/o GIB on DAPT, now on ASA only.  Marland Kitchen Hearing loss    left ear  . Hyperlipemia   . Hypertension   . Insomnia   . Irritable bowel syndrome   . Ischemic cardiomyopathy    EF 10-15%  . Memory loss   . Migraine headache without aura   . Myocardial infarct [facility]) November 13, 2009 x 2   Dr. Roswell Nickel   . Pneumonia   . PONV (postoperative nausea and vomiting)   . Pre-diabetes   . Pulmonary embolism ([place]) 11/13/2009  . Stroke [facility])    'When I was young." no residual  . Transfusion history    ?'12 or '13  . Unspecified mastoiditis     Past Surgical History:  Procedure Laterality Date  . ANGIOPLASTY  07/02/09, 04/01/10  . BILATERAL SALPINGOOPHORECTOMY    . CAD( bare metal stent)  02/2009   x 1  . CAROTID-SUBCLAVIAN BYPASS GRAFT Left 03/31/2018   Procedure: LEFT SUBCLAVIAN ARTERY BYPASS GRAFT;  Surgeon: Serafina Mitchell, MD;  Location: [facility] OR;  Service: Vascular;  Laterality: Left;  . CERVICAL SPINE SURGERY  08/08  . COLONOSCOPY WITH PROPOFOL N/A 04/29/2016   Procedure: COLONOSCOPY WITH PROPOFOL;  Surgeon: [name], MD;  Location: [facility] ENDOSCOPY;  Service: Gastroenterology;  Laterality: N/A;  . ESOPHAGOGASTRODUODENOSCOPY N/A 12/09/2016   Procedure: ESOPHAGOGASTRODUODENOSCOPY (EGD);  Surgeon: [name], MD;  Location: [facility];  Service: Gastroenterology;  Laterality: N/A;  . ESOPHAGOGASTRODUODENOSCOPY (EGD) WITH PROPOFOL N/A 04/29/2016   Procedure: ESOPHAGOGASTRODUODENOSCOPY (EGD) WITH PROPOFOL;  Surgeon: [name], MD;  Location: [facility] ENDOSCOPY;  Service: Gastroenterology;  Laterality: N/A;  . EUS N/A 05/15/2016   Procedure: UPPER ENDOSCOPIC ULTRASOUND (EUS) RADIAL;  Surgeon: Milus Banister, MD;   Location: [facility] ENDOSCOPY;  Service: Endoscopy;  Laterality: N/A;  . IMPLANTABLE CARDIOVERTER DEFIBRILLATOR IMPLANT N/A 08/05/2011   Primary prevention SJM ICD implanted,  Analyze ST study patient  . INNER EAR SURGERY     left x 17  . LUMBAR DISC SURGERY  02/2008   fusion  . NASAL SEPTUM SURGERY    . THORACIC AORTIC ENDOVASCULAR STENT GRAFT N/A 03/31/2018   Procedure: THORACIC AORTIC ENDOVASCULAR STENT GRAFT;  Surgeon: Serafina Mitchell, MD;  Location: [facility] OR;  Service: Vascular;  Laterality: N/A;  . TOTAL ABDOMINAL HYSTERECTOMY     complete    Family History  Problem Relation Age of Onset  . Colon cancer Mother   . Colon cancer Brother   . Bladder Cancer Brother   . Breast cancer Cousin   . Cancer Son     Social History Social History   Tobacco Use  . Smoking status: Current Some Day Smoker    Packs/day: 0.13    Years: 30.00    Pack years: 3.90    Types: Cigarettes  . Smokeless tobacco: Never Used  . Tobacco comment: Restarted smoking.  few cig. daily  Substance Use Topics  . Alcohol use: No    Alcohol/week: 0.0 standard drinks  . Drug use: No    Current Outpatient Medications  Medication Sig Dispense Refill  . albuterol (PROAIR HFA) 108 (90 Base) MCG/ACT inhaler Inhale 2 puffs into the lungs every 6 (six) hours as needed. For shortness of breath 18 g 5  . amitriptyline (ELAVIL) 50 MG tablet Take 1 tablet (50 mg total) by mouth at bedtime. 90 tablet 0  . aspirin 81 MG tablet Take 81 mg by mouth every morning.     . bisoprolol (ZEBETA) 5 MG tablet TAKE 1 TABLET (5 MG TOTAL) BY MOUTH DAILY. 90 tablet 3  . conjugated estrogens (PREMARIN) vaginal cream Apply 1 fingertip to outside of vagina twice a week (Patient taking differently: Place 1 Applicatorful vaginally 2 (two) times a week. Apply 1 fingertip to outside of vagina twice a week) 30 g 12  . DEXILANT 60 MG capsule TAKE 1 CAPSULE BY MOUTH DAILY. (Patient taking differently: Take 60 mg by mouth daily. ) 30 capsule 3  .  dicyclomine (BENTYL) 10 MG capsule TAKE 1 CAPSULE BY MOUTH EVERY 8 HOURS AS NEEDED FOR SPASMS. 30 capsule 3  . ENTRESTO 24-26 MG TAKE 1 TABLET BY MOUTH 2 TIMES DAILY. 180 tablet 1  . furosemide (LASIX) 20 MG tablet TAKE 1 TABLET BY MOUTH DAILY. (Patient taking differently: Take 20 mg by mouth daily. ) 30 tablet 11  . oxyCODONE-acetaminophen (PERCOCET/ROXICET) 5-325 MG tablet Take 1 tablet by mouth every 6 (six) hours as needed for moderate pain. 20 tablet 0  . polyethylene glycol powder (GLYCOLAX/MIRALAX) powder Take  17 g by mouth daily as needed for mild constipation. 255 g 11  . ranitidine (ZANTAC) 150 MG tablet TAKE 1 TABLET BY MOUTH 2 TIMES DAILY. (Patient taking differently: Take 150 mg by mouth 2 (two) times daily as needed for heartburn. ) 180 tablet 0  . rosuvastatin (CRESTOR) 20 MG tablet TAKE 1 TABLET BY MOUTH AT BEDTIME. (Patient taking differently: Take 20 mg by mouth daily. ) 90 tablet 3  . topiramate (TOPAMAX) 50 MG tablet Take 2 tablets (100 mg total) by mouth daily. (Patient taking differently: Take 50 mg by mouth at bedtime. ) 90 tablet 0   No current facility-administered medications for this visit.     Allergies  Allergen Reactions  . Lisinopril     cough  . Sulfa Antibiotics Other (See Comments)    Unknown, childhood allergy   . Sulfonamide Derivatives Other (See Comments)    UNSURE    Review of Systems  Weight has increased Still smokes 5 cigarettes a day.  BP 132/78 (BP Location: Left Arm, Patient Position: Sitting, Cuff Size: Normal)   Pulse 79   Resp 18   Ht 5\' 3"  (1.6 m)   Wt 173 lb 9.6 oz (78.7 kg)   SpO2 96% Comment: RA  BMI 30.75 kg/m  Physical Exam Alert and comfortable No JVD Left neck incision well-healed No groin hematoma Lungs clear.  Her oxygen saturation at rest is 97%.  After walking 150 feet her oxygen saturation is 98%. No murmur, heart rhythm regular No pedal edema  Diagnostic Tests: Chest x-ray showing stent graft in good position no  significant edema or effusion  Impression: Patient is done well with TEVAR  Her heart failure appears to be worse after the surgical procedure and she will double up on her Lasix and we will refer her back to her cardiologist Dr. [name].  Plan: Return for routine follow-up in 6 months   Len Childs, MD [facility] [phone]

## 2018-04-23 ENCOUNTER — Telehealth: Payer: Self-pay

## 2018-04-23 NOTE — Telephone Encounter (Signed)
-----   Message from [name], MD sent at 04/21/2018 10:51 PM EDT ----- thx Collier Salina. Valetta Fuller can you get her an appt with me or Nicki Reaper? Needs to be seen for CHF.   thx ----- Message ----- From: Ivin Poot, MD Sent: 04/21/2018  11:37 AM EDT To: [name], MD, Rutherford Guys, MD

## 2018-04-23 NOTE — Telephone Encounter (Signed)
Scheduled patient with Richardson Dopp 10/8. She will call if symptoms worsen prior to that time. She was grateful for call and agrees with treatment plan.

## 2018-04-26 ENCOUNTER — Ambulatory Visit
Admission: RE | Admit: 2018-04-26 | Discharge: 2018-04-26 | Disposition: A | Discharge status: Home / Self Care | Payer: Medicare Other | Source: Ambulatory Visit | Attending: Surgery | Admitting: Surgery

## 2018-04-26 ENCOUNTER — Ambulatory Visit (INDEPENDENT_AMBULATORY_CARE_PROVIDER_SITE_OTHER): Payer: Self-pay | Admitting: Surgery

## 2018-04-26 ENCOUNTER — Other Ambulatory Visit: Payer: Self-pay

## 2018-04-26 ENCOUNTER — Encounter: Payer: Self-pay | Admitting: Surgery

## 2018-04-26 VITALS — BP 138/85 | HR 75 | Temp 97.0°F | Resp 14 | Ht 63.0 in | Wt 174.0 lb

## 2018-04-26 DIAGNOSIS — I712 Thoracic aortic aneurysm, without rupture, unspecified: Secondary | ICD-10-CM

## 2018-04-26 MED ORDER — IOPAMIDOL (ISOVUE-370) INJECTION 76%
75.0000 mL | Freq: Once | INTRAVENOUS | Status: AC | PRN
  Administered 2018-04-26: 60 mL via INTRAVENOUS

## 2018-04-26 NOTE — Progress Notes (Signed)
Patient name: [name] MRN: [id number] DOB: March 06, 1946 Sex: female  REASON FOR VISIT:    Postop  HISTORY OF PRESENT ILLNESS:   [name] is a 72 y.o. 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  She underwent left subclavian to left carotid artery transposition and endovascular repair of her thoracic aortic ulcer with aneurysmal degeneration on 03/31/2018.  Her postoperative course was uncomplicated and she was discharged home on postoperative day 1.  The patient's biggest complaint is that of shortness of breath with activity.  She was recently seen by Dr. Prescott Gum who doubled up on her Lasix which has made some improvement.  Chest x-rays did not show any overt heart failure or pneumonia.  She is scheduled to see Dr. Burt Knack next week  CURRENT MEDICATIONS:    Current Outpatient Medications  Medication Sig Dispense Refill  . albuterol (PROAIR HFA) 108 (90 Base) MCG/ACT inhaler Inhale 2 puffs into the lungs every 6 (six) hours as needed. For shortness of breath 18 g 5  . amitriptyline (ELAVIL) 50 MG tablet Take 1 tablet (50 mg total) by mouth at bedtime. 90 tablet 0  . aspirin 81 MG tablet Take 81 mg by mouth every morning.     . bisoprolol (ZEBETA) 5 MG tablet TAKE 1 TABLET (5 MG TOTAL) BY MOUTH DAILY. 90 tablet 3  . conjugated estrogens (PREMARIN) vaginal cream Apply 1 fingertip to outside of vagina twice a week (Patient taking differently: Place 1 Applicatorful vaginally 2 (two) times a week. Apply 1 fingertip to outside of vagina twice a week) 30 g 12  . DEXILANT 60 MG capsule TAKE 1 CAPSULE BY MOUTH DAILY. (Patient taking differently: Take 60 mg by mouth daily. ) 30 capsule 3  . dicyclomine (BENTYL) 10 MG capsule TAKE 1 CAPSULE BY MOUTH EVERY 8 HOURS AS NEEDED FOR SPASMS. 30 capsule 3  . ENTRESTO 24-26 MG TAKE 1 TABLET BY MOUTH 2 TIMES DAILY. 180 tablet 1  . furosemide (LASIX) 20 MG tablet  TAKE 1 TABLET BY MOUTH DAILY. (Patient taking differently: Take 20 mg by mouth daily. ) 30 tablet 11  . oxyCODONE-acetaminophen (PERCOCET/ROXICET) 5-325 MG tablet Take 1 tablet by mouth every 6 (six) hours as needed for moderate pain. 20 tablet 0  . polyethylene glycol powder (GLYCOLAX/MIRALAX) powder Take 17 g by mouth daily as needed for mild constipation. 255 g 11  . ranitidine (ZANTAC) 150 MG tablet TAKE 1 TABLET BY MOUTH 2 TIMES DAILY. (Patient taking differently: Take 150 mg by mouth 2 (two) times daily as needed for heartburn. ) 180 tablet 0  . rosuvastatin (CRESTOR) 20 MG tablet TAKE 1 TABLET BY MOUTH AT BEDTIME. (Patient taking differently: Take 20 mg by mouth daily. ) 90 tablet 3  . topiramate (TOPAMAX) 50 MG tablet Take 2 tablets (100 mg total) by mouth daily. (Patient taking differently: Take 50 mg by mouth at bedtime. ) 90 tablet 0   No current facility-administered medications for this visit.     REVIEW OF SYSTEMS:   [X]  denotes positive finding, [ ]  denotes negative finding Cardiac  Comments:  Chest pain or chest pressure:    Shortness of breath upon exertion:    Short of breath when lying flat:    Irregular heart rhythm:    Constitutional    Fever or chills:      PHYSICAL EXAM:   There were no vitals filed for this visit.  GENERAL: The patient is a well-nourished female,  in no acute distress. The vital signs are documented above. CARDIOVASCULAR: There is a regular rate and rhythm. PULMONARY: Non-labored respirations Palpable left brachial pulse all incisions are healing nicely  STUDIES:   I have reviewed her CT scan with the following findings: Successful exclusion of the thoracic penetrating ulcer and patent carotid subclavian transposition.  MEDICAL ISSUES:   Status post repair of thoracic aortic ulcer with TEVAR and carotid subclavian transposition.  Patient has a history of congestive heart failure which was likely exacerbated by the operation.  She seems to  be improved today.  She is scheduled to see Dr. Burt Knack, her cardiologist within the week.  Her CT angiogram today shows that the stent graft is in good position.  I have the patient scheduled to come back for follow-up with a CT angiogram of the chest and carotid duplex in 6 months.  And that her bypass graft is widely patent  Annamarie Major, MD [facility] [phone] Pager [phone]

## 2018-05-04 ENCOUNTER — Ambulatory Visit (INDEPENDENT_AMBULATORY_CARE_PROVIDER_SITE_OTHER): Payer: Medicare Other | Admitting: Physician Assistant

## 2018-05-04 ENCOUNTER — Encounter: Payer: Self-pay | Admitting: Physician Assistant

## 2018-05-04 VITALS — BP 120/76 | HR 68 | Ht 63.0 in | Wt 175.8 lb

## 2018-05-04 DIAGNOSIS — I5022 Chronic systolic (congestive) heart failure: Secondary | ICD-10-CM | POA: Diagnosis not present

## 2018-05-04 DIAGNOSIS — I712 Thoracic aortic aneurysm, without rupture, unspecified: Secondary | ICD-10-CM

## 2018-05-04 DIAGNOSIS — I2510 Atherosclerotic heart disease of native coronary artery without angina pectoris: Secondary | ICD-10-CM | POA: Diagnosis not present

## 2018-05-04 DIAGNOSIS — N183 Chronic kidney disease, stage 3 unspecified: Secondary | ICD-10-CM

## 2018-05-04 DIAGNOSIS — I10 Essential (primary) hypertension: Secondary | ICD-10-CM | POA: Diagnosis not present

## 2018-05-04 DIAGNOSIS — F17200 Nicotine dependence, unspecified, uncomplicated: Secondary | ICD-10-CM

## 2018-05-04 LAB — BASIC METABOLIC PANEL
BUN/Creatinine Ratio: 15 (ref 12–28)
BUN: 25 mg/dL (ref 8–27)
CALCIUM: 9.1 mg/dL (ref 8.7–10.3)
CO2: 17 mmol/L — ABNORMAL LOW (ref 20–29)
CREATININE: 1.7 mg/dL — AB (ref 0.57–1.00)
Chloride: 108 mmol/L — ABNORMAL HIGH (ref 96–106)
GFR, EST AFRICAN AMERICAN: 35 mL/min/{1.73_m2} — AB (ref >59)
GFR, EST NON AFRICAN AMERICAN: 30 mL/min/{1.73_m2} — AB (ref >59)
Glucose: 101 mg/dL — ABNORMAL HIGH (ref 65–99)
Potassium: 4.6 mmol/L (ref 3.5–5.2)
Sodium: 141 mmol/L (ref 134–144)

## 2018-05-04 LAB — PRO B NATRIURETIC PEPTIDE: NT-Pro BNP: 1046 pg/mL — ABNORMAL HIGH (ref 0–301)

## 2018-05-04 MED ORDER — FUROSEMIDE 20 MG PO TABS: 40.0000 mg | ORAL_TABLET | Freq: Every day | ORAL | 3 refills | Status: DC

## 2018-05-04 NOTE — Patient Instructions (Signed)
Medication Instructions:  1. CONTINUE ON LASIX AS YOU CURRENTLY ARE OF LASIX 20 MG TAKING 2 TABS DAILY If you need a refill on your cardiac medications before your next appointment, please call your pharmacy.   Lab work: TODAY BMET, PRO BNP If you have labs (blood work) drawn today and your tests are completely normal, you will receive your results only by: Marland Kitchen MyChart Message (if you have MyChart) OR . A paper copy in the mail If you have any lab test that is abnormal or we need to change your treatment, we will call you to review the results.  Testing/Procedures: NONE ORDERED TODAY  Follow-Up: At [facility], you and your health needs are our priority.  As part of our continuing mission to provide you with exceptional heart care, we have created designated Provider Care Teams.  These Care Teams include your primary Cardiologist (physician) and Advanced Practice Providers (APPs -  Physician Assistants and Nurse Practitioners) who all work together to provide you with the care you need, when you need it. You will need a follow up appointment in:  6 weeks.  Please call our office 2 months in advance to schedule this appointment.  You may see No primary care provider on file. WITH SCOTT WEAVER, PAC  or one of the following Advanced Practice Providers on your designated Care Team: Richardson Dopp, PA-C 4-6 [place], [place] . Daune Perch, NP  Any Other Special Instructions Will Be Listed Below (If Applicable). WEIGH DAILY AND CALL THE OFFICE IF WT IS INCREASED BY 3LB'S IN 1 DAY OR 5 LB'S IN 1 WEEK [phone]

## 2018-05-04 NOTE — Progress Notes (Signed)
Cardiology Office Note:    Date:  05/04/2018   ID:  [name], DOB Oct 26, 1945, MRN [id number]  PCP:  Rutherford Guys, MD  Cardiologist:  [name], MD   Electrophysiologist:  None   Referring MD: Rutherford Guys, MD   Chief Complaint  Patient presents with  . Follow-up    CHF     History of Present Illness:    [name] is a 72 y.o. female with coronary artery disease status post anterior myocardial infarction in 2010 treated with bare-metal stent to the proximal LAD and multiple percutaneous coronary interventions since that time due to in-stent restenosis, systolic heart failure secondary to ischemic cardiomyopathy, status post ICD, prior pulmonary embolism, COPD, chronic kidney disease, prior GI bleeding, hypertension, hyperlipidemia and thoracic aortic aneurysm.  She underwent endovascular repair of her thoracic aortic aneurysm which was preceded by left carotid subclavian transposition in September 2019.  She was recently seen by Dr. Prescott Gum for follow up and was noted to be volume overloaded.  Her Lasix was increased and she was referred back to our office for evaluation.     [name] returns for follow up.  She is here with her grandson.  She notes chronic dyspnea on exertion with mild to moderate activities.  She felt much worse when she saw Dr. Prescott Gum.  Since increasing her Lasix, she feels better.  She feels like her breathing is back to her baseline.  She does note episodic diaphoresis.  She has occasional chest pain described as tightness that is related to exertional shortness of breath.  This is a chronic symptom without significant change.  She does not weigh herself.  She denies orthopnea, paroxysmal nocturnal dyspnea, edema.    Prior CV studies:   The following studies were reviewed today:  Chest/Abd CTA 04/26/18 IMPRESSION: 1. Good appearance of stent graft extending from the distal arch into the proximal descending thoracic segment, with  exclusion of saccular aneurysm, no endoleak. 2. Coverage and occlusion of left subclavian artery origin, with patent left carotid subclavian bypass. 3. Bilateral renal artery ostial stenoses of probable hemodynamic significance. 4. Aortoiliac atherosclerosis (ICD10-170.0).  ABI 03/15/18 Final Interpretation: Right: Resting right ankle-brachial index is within normal range. No evidence of significant right lower extremity arterial disease. The right toe-brachial index is abnormal. RT great toe pressure = 75 mmHg. Left: Resting left ankle-brachial index is within normal range. No evidence of significant left lower extremity arterial disease. The left toe-brachial index is normal. LT Great toe pressure = 99 mmHg.  Limited Echo 10/22/16 EF 25-30, apical and inf  HK  Echo 09/01/16 EF 25-30, ant-sept and apical AK, Gr 1 DD, mild LAE  Carotid US 09/08/16 bilat 1-39  LHC (06/2010):   prox LAD stent ok with 30-50% ISR, prox to mid RCA 30-40%.  Echo (04/2011):   Septal apical and ant HK, cannot r/o mural apical clot, EF 25%, mild LAE.  Nuclear (07/2013):   Large scar involving apex and apical ant and apical septal segments, no ischemia, EF 25%; High Risk  Past Medical History:  Diagnosis Date  . AICD (automatic cardioverter/defibrillator) present    Dr.Allred follows  . Aortic arch pseudoaneurysm (HCC)    a. followed by Dr. Prescott Gum.  . Arthritis   . Asthma   . Benign neoplasm of colon   . CAD (coronary artery disease) 02/2009   a. anterior STEMI rx with BMS to prox LAD in 02/2009. b. ISR s/p PTCA 06/2009. c.  ISR s/p thrombectomy & PTCA 03/2010 due to late stent thrombosis.   . Chronic renal insufficiency    stage 3  . Chronic systolic CHF (congestive heart failure) ([place])    a. s/p ST. Jude ICD 11/03/2011.  . Colon polyp   . Complication of anesthesia   . COPD (chronic obstructive pulmonary disease) ([place])   . Depression    "since my son died in 12-04-22" 2017-11-02)  . Diverticulosis   . Dyspnea     "when I lay down at night"   . GERD (gastroesophageal reflux disease)   . GI bleed    a. h/o GIB on DAPT, now on ASA only.  Marland Kitchen Hearing loss    left ear  . Hyperlipemia   . Hypertension   . Insomnia   . Irritable bowel syndrome   . Ischemic cardiomyopathy    EF 10-15%  . Memory loss   . Migraine headache without aura   . Myocardial infarct [facility]) Nov 02, 2009 x 2   Dr. Roswell Nickel   . Pneumonia   . PONV (postoperative nausea and vomiting)   . Pre-diabetes   . Pulmonary embolism ([place]) 11-02-09  . Stroke [facility])    'When I was young." no residual  . Transfusion history    ?'12 or '13  . Unspecified mastoiditis    Surgical Hx: The patient  has a past surgical history that includes Bilateral salpingoophorectomy; Inner ear surgery; Cervical spine surgery (08/08); Lumbar disc surgery (02/2008); CAD( bare metal stent) (02/2009); Angioplasty (07/02/09, 04/01/10); implantable cardioverter defibrillator implant (N/A, 08/05/2011); Esophagogastroduodenoscopy (egd) with propofol (N/A, 04/29/2016); Colonoscopy with propofol (N/A, 04/29/2016); Total abdominal hysterectomy; EUS (N/A, 05/15/2016); Esophagogastroduodenoscopy (N/A, 12/09/2016); Nasal septum surgery; Thoracic aortic endovascular stent graft (N/A, 03/31/2018); and Carotid-subclavian Bypass Graft (Left, 03/31/2018).   Current Medications: Current Meds  Medication Sig  . albuterol (PROAIR HFA) 108 (90 Base) MCG/ACT inhaler Inhale 2 puffs into the lungs every 6 (six) hours as needed. For shortness of breath  . amitriptyline (ELAVIL) 50 MG tablet Take 1 tablet (50 mg total) by mouth at bedtime.  Marland Kitchen aspirin 81 MG tablet Take 81 mg by mouth every morning.   . bisoprolol (ZEBETA) 5 MG tablet TAKE 1 TABLET (5 MG TOTAL) BY MOUTH DAILY.  Marland Kitchen DEXILANT 60 MG capsule TAKE 1 CAPSULE BY MOUTH DAILY.  Marland Kitchen dicyclomine (BENTYL) 10 MG capsule TAKE 1 CAPSULE BY MOUTH EVERY 8 HOURS AS NEEDED FOR SPASMS.  Marland Kitchen ENTRESTO 24-26 MG TAKE 1 TABLET BY MOUTH 2 TIMES DAILY.  . mupirocin  ointment (BACTROBAN) 2 % APPLY TOPICALLY INSIDE EACH NOSTRIL BID FOR 5 DAYS  . polyethylene glycol powder (GLYCOLAX/MIRALAX) powder Take 17 g by mouth daily as needed for mild constipation.  . rosuvastatin (CRESTOR) 20 MG tablet TAKE 1 TABLET BY MOUTH AT BEDTIME.  Marland Kitchen topiramate (TOPAMAX) 50 MG tablet Take 50 mg by mouth daily.  . [DISCONTINUED] furosemide (LASIX) 20 MG tablet TAKE 1 TABLET BY MOUTH DAILY.     Allergies:   Lisinopril; Sulfa antibiotics; and Sulfonamide derivatives   Social History   Tobacco Use  . Smoking status: Current Some Day Smoker    Packs/day: 0.13    Years: 30.00    Pack years: 3.90    Types: Cigarettes  . Smokeless tobacco: Never Used  . Tobacco comment: Restarted smoking.  few cig. daily  Substance Use Topics  . Alcohol use: No    Alcohol/week: 0.0 standard drinks  . Drug use: No     Family Hx: The patient's family  history includes Bladder Cancer in her brother; Breast cancer in her cousin; Cancer in her son; Colon cancer in her brother and mother.  ROS:   Please see the history of present illness.    Review of Systems  Constitution: Positive for diaphoresis.  HENT: Positive for hearing loss.   Cardiovascular: Positive for dyspnea on exertion.  Respiratory: Positive for shortness of breath.   Hematologic/Lymphatic: Bruises/bleeds easily.  Musculoskeletal: Positive for back pain.  Gastrointestinal: Positive for abdominal pain and constipation.  Neurological: Positive for dizziness, headaches and loss of balance.   All other systems reviewed and are negative.   EKGs/Labs/Other Test Reviewed:    EKG:  EKG is not ordered today.   EKG from PCP on 04/15/18 personally reviewed today - NSR, HR 75, LAD, IVCD, no change from prior tracing  Recent Labs: 06/08/2017: TSH 1.250 03/26/2018: ALT 17 03/31/2018: Magnesium 2.0 04/01/2018: Platelets 150 04/15/2018: Hemoglobin 12.9 05/04/2018: BUN 25; Creatinine, Ser 1.70; NT-Pro BNP 1,046; Potassium 4.6; Sodium 141     Recent Lipid Panel Lab Results  Component Value Date/Time   CHOL 212 (H) 06/08/2017 12:30 PM   TRIG 184 (H) 06/08/2017 12:30 PM   HDL 73 06/08/2017 12:30 PM   CHOLHDL 2.9 06/08/2017 12:30 PM   CHOLHDL 2.7 01/15/2016 01:45 PM   LDLCALC 102 (H) 06/08/2017 12:30 PM    Physical Exam:    VS:  BP 120/76   Pulse 68   Ht 5\' 3"  (1.6 m)   Wt 175 lb 12.8 oz (79.7 kg)   SpO2 97%   BMI 31.14 kg/m     Wt Readings from Last 3 Encounters:  05/04/18 175 lb 12.8 oz (79.7 kg)  04/26/18 174 lb (78.9 kg)  04/21/18 173 lb 9.6 oz (78.7 kg)     Physical Exam  Constitutional: She is oriented to person, place, and time. She appears well-developed and well-nourished. No distress.  HENT:  Head: Normocephalic and atraumatic.  Eyes: No scleral icterus.  Neck: No JVD present.  Cardiovascular: Normal rate and regular rhythm.  No murmur heard. Pulmonary/Chest: She has no rales.  Abdominal: Soft. She exhibits no distension.  Musculoskeletal: She exhibits no edema.  Neurological: She is alert and oriented to person, place, and time.  Skin: Skin is warm and dry.  Psychiatric: She has a normal mood and affect.    ASSESSMENT & PLAN:    Chronic systolic heart failure (HCC) EF 25-30.  NYHA 3a.  Volume seems to be improved since increasing her Lasix.  I will repeat her BMET and BNP today.  Consider increasing Entresto vs adding Spironolactone at follow up.  I will need to base this on whether or not her renal function is stable.  For now, continue current dose of Bisoprolol and Entresto.  Continue current dose of Lasix.  -Continue current Rx  -BMET, BNP today  -Follow up in 4-6 weeks.   Coronary artery disease involving native coronary artery of native heart without angina pectoris Hx of MI in 2010 tx with BMS to the LAD.  She has had multiple PCI procedures due to in stent restenosis.  She does have some chronic chest pain with exertion. This is overall stable.  We could consider adding nitrates if  her renal function cannot tolerate increasing Entresto or adding Spironolactone.  Continue ASA, statin, beta-blocker.  Essential hypertension The patient's blood pressure is controlled on her current regimen.  Continue current therapy.    Thoracic aortic aneurysm without rupture [facility]) S/p recent endovascular repair.  Follow  up with surgery as planned.  TOBACCO ABUSE We discussed the importance of quitting.  CKD (chronic kidney disease) stage 3, GFR 30-59 ml/min ([place]) Obtain BMET today.  She did have a recent CTA with the suggestion of bilateral renal artery stenosis.  Her blood pressure is controlled.  If it becomes difficult to control or her creatinine continues to rise, she may need formal renal arterial dopplers.   Dispo:  Return in about 4 weeks (around 06/01/2018) for Close Follow Up, w/ Dr. Burt Knack, or Richardson Dopp, PA-C.   Medication Adjustments/Labs and Tests Ordered: Current medicines are reviewed at length with the patient today.  Concerns regarding medicines are outlined above.  Tests Ordered: Orders Placed This Encounter  Procedures  . Basic Metabolic Panel (BMET)  . Pro b natriuretic peptide   Medication Changes: Meds ordered this encounter  Medications  . furosemide (LASIX) 20 MG tablet    Sig: Take 2 tablets (40 mg total) by mouth daily.    Dispense:  180 tablet    Refill:  3    Signed, Richardson Dopp, PA-C  05/04/2018 5:32 PM    [facility] [address], [place], [place]  [address] Phone: [phone]; Fax: [phone]

## 2018-05-05 ENCOUNTER — Telehealth: Payer: Self-pay | Admitting: *Deleted

## 2018-05-05 DIAGNOSIS — N183 Chronic kidney disease, stage 3 unspecified: Secondary | ICD-10-CM

## 2018-05-05 DIAGNOSIS — I5022 Chronic systolic (congestive) heart failure: Secondary | ICD-10-CM

## 2018-05-05 NOTE — Telephone Encounter (Signed)
Pt has been notified of lab results and recommendations. Pt is agreeable to lab work 10/16. Pt thanked me for the call.

## 2018-05-05 NOTE — Telephone Encounter (Signed)
-----   Message from Liliane Shi, [place] sent at 05/04/2018  6:07 PM EDT ----- Renal function a little worse.  The potassium is normal.  BNP is elevated. Recommendations:  - Continue current dose of Lasix  - Repeat BMET, BNP again in 1 week Richardson Dopp, PA-C    05/04/2018 6:02 PM

## 2018-05-06 ENCOUNTER — Other Ambulatory Visit: Payer: Self-pay | Admitting: Cardiovascular Disease

## 2018-05-10 ENCOUNTER — Ambulatory Visit: Payer: Medicare Other | Admitting: Cardiology

## 2018-05-12 ENCOUNTER — Other Ambulatory Visit: Payer: Medicare Other | Admitting: *Deleted

## 2018-05-12 DIAGNOSIS — N183 Chronic kidney disease, stage 3 unspecified: Secondary | ICD-10-CM

## 2018-05-12 DIAGNOSIS — I5022 Chronic systolic (congestive) heart failure: Secondary | ICD-10-CM

## 2018-05-12 LAB — BASIC METABOLIC PANEL
BUN / CREAT RATIO: 14 (ref 12–28)
BUN: 23 mg/dL (ref 8–27)
CHLORIDE: 104 mmol/L (ref 96–106)
CO2: 19 mmol/L — ABNORMAL LOW (ref 20–29)
Calcium: 9.3 mg/dL (ref 8.7–10.3)
Creatinine, Ser: 1.68 mg/dL — ABNORMAL HIGH (ref 0.57–1.00)
GFR calc Af Amer: 35 mL/min/{1.73_m2} — ABNORMAL LOW (ref >59)
GFR calc non Af Amer: 31 mL/min/{1.73_m2} — ABNORMAL LOW (ref >59)
Glucose: 64 mg/dL — ABNORMAL LOW (ref 65–99)
Potassium: 4.4 mmol/L (ref 3.5–5.2)
SODIUM: 143 mmol/L (ref 134–144)

## 2018-05-12 LAB — PRO B NATRIURETIC PEPTIDE: NT-PRO BNP: 699 pg/mL — AB (ref 0–301)

## 2018-05-13 ENCOUNTER — Telehealth: Payer: Self-pay

## 2018-05-13 NOTE — Telephone Encounter (Signed)
-----   Message from Liliane Shi, PA-C sent at 05/13/2018  8:41 AM EDT ----- BNP improved. Renal function stable.  Potassium normal. Recommendations:  -Continue current medications and follow-up as planned. Richardson Dopp, PA-C    05/13/2018 8:41 AM

## 2018-05-13 NOTE — Telephone Encounter (Signed)
Notes recorded by Frederik Schmidt, RN on 05/13/2018 at 8:55 AM EDT The patient has been notified of the result and verbalized understanding. All questions (if any) were answered. Frederik Schmidt, RN 05/13/2018 8:54 AM

## 2018-05-17 ENCOUNTER — Ambulatory Visit (INDEPENDENT_AMBULATORY_CARE_PROVIDER_SITE_OTHER): Payer: Medicare Other | Admitting: *Deleted

## 2018-05-17 DIAGNOSIS — I255 Ischemic cardiomyopathy: Secondary | ICD-10-CM

## 2018-05-17 NOTE — Progress Notes (Signed)
Remote ICD transmission.   

## 2018-05-18 ENCOUNTER — Encounter: Payer: Self-pay | Admitting: Cardiology

## 2018-06-09 NOTE — Research (Signed)
Pt came in today for her Beat HF 18 month visit.   Since her last visit she has undergone successful TEVAR stent grafting of a proximal descending thoracic pseudo-aneurysm by Dr. Trula Slade. This was preceded by a left carotid to subclavian transposition because the left subclavian was covered.  The patient has done well.  She is complaining of sob which Dr Nils Pyle has adjusted some of her meds to try and help with SOB and referred her back to Dr Burt Knack.

## 2018-06-15 ENCOUNTER — Ambulatory Visit: Payer: Medicare Other | Admitting: Physician Assistant

## 2018-06-16 ENCOUNTER — Encounter: Payer: Self-pay | Admitting: Family Medicine

## 2018-06-16 ENCOUNTER — Other Ambulatory Visit: Payer: Self-pay | Admitting: Family Medicine

## 2018-06-16 ENCOUNTER — Other Ambulatory Visit: Payer: Self-pay

## 2018-06-16 ENCOUNTER — Ambulatory Visit (INDEPENDENT_AMBULATORY_CARE_PROVIDER_SITE_OTHER): Payer: Medicare Other | Admitting: Family Medicine

## 2018-06-16 VITALS — BP 113/72 | HR 92 | Temp 98.4°F | Ht 63.0 in | Wt 173.6 lb

## 2018-06-16 DIAGNOSIS — L989 Disorder of the skin and subcutaneous tissue, unspecified: Secondary | ICD-10-CM | POA: Diagnosis not present

## 2018-06-16 DIAGNOSIS — K2100 Gastro-esophageal reflux disease with esophagitis, without bleeding: Secondary | ICD-10-CM

## 2018-06-16 DIAGNOSIS — I10 Essential (primary) hypertension: Secondary | ICD-10-CM | POA: Diagnosis not present

## 2018-06-16 DIAGNOSIS — Z23 Encounter for immunization: Secondary | ICD-10-CM

## 2018-06-16 DIAGNOSIS — K635 Polyp of colon: Secondary | ICD-10-CM | POA: Insufficient documentation

## 2018-06-16 MED ORDER — SUCRALFATE 1 GM/10ML PO SUSP: 1.0000 g | Freq: Three times a day (TID) | ORAL | 0 refills | Status: DC

## 2018-06-16 NOTE — Patient Instructions (Signed)
° ° ° °  If you have lab work done today you will be contacted with your lab results within the next 2 weeks.  If you have not heard from us then please contact us. The fastest way to get your results is to register for My Chart. ° ° °IF you received an x-ray today, you will receive an invoice from [facility]. Please contact [facility] at [phone] with questions or concerns regarding your invoice.  ° °IF you received labwork today, you will receive an invoice from [facility]. Please contact [facility] at [phone] with questions or concerns regarding your invoice.  ° °Our billing staff will not be able to assist you with questions regarding bills from these companies. ° °You will be contacted with the lab results as soon as they are available. The fastest way to get your results is to activate your My Chart account. Instructions are located on the last page of this paperwork. If you have not heard from us regarding the results in 2 weeks, please contact this office. °  ° ° ° °

## 2018-06-16 NOTE — Telephone Encounter (Signed)
Copied from [place] [id number]. Topic: Quick Communication - Rx Refill/Question >> Jun 16, 2018  4:05 PM [place], [place] D wrote: Medication: amitriptyline (ELAVIL) 50 MG tablet / albuterol (PROAIR HFA) 108 (90 Base) MCG/ACT inhaler   Has the patient contacted their pharmacy? No (Agent: If no, request that the patient contact the pharmacy for the refill.) (Agent: If yes, when and what did the pharmacy advise?)  Preferred Pharmacy (with phone number or street name): [facility], [place] - [address] [place] [phone] (Phone) [phone] (Fax)    Agent: Please be advised that RX refills may take up to 3 business days. We ask that you follow-up with your pharmacy.

## 2018-06-16 NOTE — Progress Notes (Signed)
11/20/20192:04 PM  [name] 05-27-1946, 72 y.o. female [id number]  Chief Complaint  Patient presents with  . Follow-up    doing better on he loss of her son, no problem with constipation.Had heart surgery in Sept, received stent in aorta.    HPI:   Patient is a 72 y.o. female with past medical history significant for CAD, CHF, PAD, HTN, HLP, IBS, GERD, migraines, CKD 3, COPD, grief, prediabetes who presents today for followup  Thoracic aorta aneurysm repair in sept 2019 Saw cardiology in oct 2019 Had lasix increased due to fluid overaload Has upcoming appts  Doing better with grief Will be spending holidays with older son  Having increased frequency of heartburn Despite regular PPI Denies any changes in diet, caffeine intake Does not do NSAIDS No black tarry stools EGD in 2017 - ulcers resolved, had grade 1 esophagitis  Has not been driving since her surgery as her grandson as it She is okay with this, doing well with family providing transportation  Would like to have me see a lesion on right side of face Has been there for over 2 years She keeps it covered with her hair  Does not heal, keeps bleeding  Lab Results  Component Value Date   HGBA1C 6.4 (H) 03/26/2018   HGBA1C 5.8 (H) 12/18/2017   HGBA1C 6.0 (H) 06/08/2017   Lab Results  Component Value Date   LDLCALC 102 (H) 06/08/2017   CREATININE 1.68 (H) 05/12/2018    Fall Risk  06/16/2018 03/16/2018 12/18/2017 11/21/2017 10/29/2017  Falls in the past year? 0 No No No No  Number falls in past yr: - - - - -  Injury with Fall? - - - - -  Follow up - - - - -     Depression screen [facility] 2/9 12/18/2017 11/21/2017 10/29/2017  Decreased Interest 0 0 (No Data)  Down, Depressed, Hopeless 0 0 -  PHQ - 2 Score 0 0 -  Some recent data might be hidden    Allergies  Allergen Reactions  . Lisinopril     cough  . Sulfa Antibiotics Other (See Comments)    Unknown, childhood allergy   . Sulfonamide Derivatives  Other (See Comments)    UNSURE    Prior to Admission medications   Medication Sig Start Date End Date Taking? Authorizing Provider  albuterol (PROAIR HFA) 108 (90 Base) MCG/ACT inhaler Inhale 2 puffs into the lungs every 6 (six) hours as needed. For shortness of breath 03/16/18  Yes Rutherford Guys, MD  amitriptyline (ELAVIL) 50 MG tablet Take 1 tablet (50 mg total) by mouth at bedtime. 03/16/18  Yes Rutherford Guys, MD  aspirin 81 MG tablet Take 81 mg by mouth every morning.    Yes [provider]  bisoprolol (ZEBETA) 5 MG tablet TAKE 1 TABLET (5 MG TOTAL) BY MOUTH DAILY. 12/14/17  Yes [name], MD  DEXILANT 60 MG capsule TAKE 1 CAPSULE BY MOUTH DAILY. 03/16/18  Yes Armbruster, Carlota Raspberry, MD  dicyclomine (BENTYL) 10 MG capsule TAKE 1 CAPSULE BY MOUTH EVERY 8 HOURS AS NEEDED FOR SPASMS. 03/16/18  Yes Armbruster, Carlota Raspberry, MD  ENTRESTO 24-26 MG TAKE 1 TABLET BY MOUTH 2 TIMES DAILY. 05/06/18  Yes [name], MD  furosemide (LASIX) 20 MG tablet Take 2 tablets (40 mg total) by mouth daily. 05/04/18 08/02/18 Yes Weaver, Scott T, PA-C  mupirocin ointment (BACTROBAN) 2 % APPLY TOPICALLY INSIDE EACH NOSTRIL BID FOR 5 DAYS 03/26/18  Yes [provider]  polyethylene glycol powder (GLYCOLAX/MIRALAX) powder Take 17 g by mouth daily as needed for mild constipation. 07/08/17  Yes Rutherford Guys, MD  rosuvastatin (CRESTOR) 20 MG tablet TAKE 1 TABLET BY MOUTH AT BEDTIME. 06/30/17  Yes [name], MD  topiramate (TOPAMAX) 50 MG tablet Take 50 mg by mouth daily.   Yes [provider]  PREMARIN vaginal cream  05/25/18   [provider]    Past Medical History:  Diagnosis Date  . Abdominal pain, left lower quadrant 08/14/2009   Qualifier: Diagnosis of  By: Laney Potash, Pam    . Abnormal CT scan, stomach   . Abnormal LFTs 06/19/2017  . AICD (automatic cardioverter/defibrillator) present    Dr.Allred follows  . Aortic arch pseudoaneurysm (HCC)    a. followed by  Dr. Prescott Gum.  . Arthritis   . Asthma   . Benign neoplasm of colon   . CAD (coronary artery disease) 02/2009   a. anterior STEMI rx with BMS to prox LAD in 02/2009. b. ISR s/p PTCA 06/2009. c. ISR s/p thrombectomy & PTCA 03/2010 due to late stent thrombosis.   . Cardiomyopathy, ischemic 06/12/2011  . Chronic renal insufficiency    stage 3  . Chronic systolic CHF (congestive heart failure) ([place])    a. s/p ST. Jude ICD 10/24/11.  Marland Kitchen Chronic systolic heart failure ([place]) 08/05/2011  . Colon polyp   . Complication of anesthesia   . COPD (chronic obstructive pulmonary disease) ([place])   . CORONARY ARTERY DISEASE 04/24/2009   Qualifier: Diagnosis of  By: Trellis Paganini PA-c, Amy S   . DDD (degenerative disc disease), lumbar 05/16/2011  . Depression    "since my son died in 11-24-2022" 2017-10-23)  . Diverticulosis   . DIVERTICULOSIS-COLON 08/14/2009   Qualifier: Diagnosis of  By: Trellis Paganini PA-c, Amy S   . DYSPHAGIA UNSPECIFIED 04/24/2009   Qualifier: Diagnosis of  By: Laney Potash, Pam    . Dyspnea    "when I lay down at night"   . Essential hypertension 09/05/2009   Qualifier: Diagnosis of  By: Harvest Dark CMA, Anderson Malta    . Gastric polyp   . GERD 10/19/2007   Qualifier: Diagnosis of  By: Rennie Plowman RN, Blanca Friend: Diagnosis of  By: Laney Potash, Pam    . GERD (gastroesophageal reflux disease)   . GI bleed    a. h/o GIB on DAPT, now on ASA only.  Marland Kitchen Headache 01/03/2013  . Hearing loss    left ear  . History of pulmonary embolus (PE) 01/02/2011  . Hyperlipemia   . Hyperlipidemia, unspecified 04/24/2009   Qualifier: Diagnosis of  By: Trellis Paganini PA-c, Amy S   . Hypertension   . ICD-St.Jude 08/06/2011   S/p St. Jude ICD placement 08/05/11   . Insomnia   . Irritable bowel syndrome   . Ischemic cardiomyopathy    EF 10-15%  . Memory loss   . Migraine headache without aura   . Myocardial infarct [facility]) 2009-10-23 x 2   Dr. Roswell Nickel   . PERSONAL HX COLONIC POLYPS 04/24/2009   Qualifier: Diagnosis of  By: Shane Crutch, Amy S November, 2011 colonoscopy demonstrated a sessile cecal polyp and sigmoid polyp   . Pneumonia   . PONV (postoperative nausea and vomiting)   . Pre-diabetes   . Pseudoaneurysm of aortic arch ([place]) 05/05/2012  . Pulmonary embolism ([place]) 10/23/2009  . Stroke [facility])    'When I was young." no residual  . Thoracic aortic aneurysm ([place]) 03/31/2018  .  TOBACCO ABUSE 04/04/2009   Qualifier: Diagnosis of  By: Arvid Right  Qualifier: Diagnosis of  By: [name], MD, Jaquelyn Bitter   . Transfusion history    ?'12 or '13  . UNSPECIFIED ANEMIA 07/03/2010   Qualifier: Diagnosis of  By: Shelda Pal    . Unspecified mastoiditis     Past Surgical History:  Procedure Laterality Date  . ANGIOPLASTY  07/02/09, 04/01/10  . BILATERAL SALPINGOOPHORECTOMY    . CAD( bare metal stent)  02/2009   x 1  . CAROTID-SUBCLAVIAN BYPASS GRAFT Left 03/31/2018   Procedure: LEFT SUBCLAVIAN ARTERY BYPASS GRAFT;  Surgeon: Serafina Mitchell, MD;  Location: [place];  Service: Vascular;  Laterality: Left;  . CERVICAL SPINE SURGERY  08/08  . COLONOSCOPY WITH PROPOFOL N/A 04/29/2016   Procedure: COLONOSCOPY WITH PROPOFOL;  Surgeon: [name], MD;  [facility];  Service: Gastroenterology;  Laterality: N/A;  . ESOPHAGOGASTRODUODENOSCOPY N/A 12/09/2016   Procedure: ESOPHAGOGASTRODUODENOSCOPY (EGD);  Surgeon: [name], MD;  [facility];  Service: Gastroenterology;  Laterality: N/A;  . ESOPHAGOGASTRODUODENOSCOPY (EGD) WITH PROPOFOL N/A 04/29/2016   Procedure: ESOPHAGOGASTRODUODENOSCOPY (EGD) WITH PROPOFOL;  Surgeon: [name], MD;  [facility];  Service: Gastroenterology;  Laterality: N/A;  . EUS N/A 05/15/2016   Procedure: UPPER ENDOSCOPIC ULTRASOUND (EUS) RADIAL;  Surgeon: Milus Banister, MD;  [facility];  Service: Endoscopy;  Laterality: N/A;  . IMPLANTABLE CARDIOVERTER DEFIBRILLATOR IMPLANT N/A 08/05/2011   Primary prevention SJM ICD  implanted,  Analyze ST study patient  . INNER EAR SURGERY     left x 17  . LUMBAR DISC SURGERY  02/2008   fusion  . NASAL SEPTUM SURGERY    . THORACIC AORTIC ENDOVASCULAR STENT GRAFT N/A 03/31/2018   Procedure: THORACIC AORTIC ENDOVASCULAR STENT GRAFT;  Surgeon: Serafina Mitchell, MD;  Location: [facility];  Service: Vascular;  Laterality: N/A;  . TOTAL ABDOMINAL HYSTERECTOMY     complete    Social History   Tobacco Use  . Smoking status: Current Some Day Smoker    Packs/day: 0.13    Years: 30.00    Pack years: 3.90    Types: Cigarettes  . Smokeless tobacco: Never Used  . Tobacco comment: Restarted smoking.  few cig. daily  Substance Use Topics  . Alcohol use: No    Alcohol/week: 0.0 standard drinks    Family History  Problem Relation Age of Onset  . Colon cancer Mother   . Colon cancer Brother   . Bladder Cancer Brother   . Breast cancer Cousin   . Cancer Son     Review of Systems  Constitutional: Negative for chills and fever.  Respiratory: Negative for cough and shortness of breath.   Cardiovascular: Negative for chest pain, palpitations and leg swelling.  Gastrointestinal: Positive for heartburn. Negative for abdominal pain, blood in stool, constipation, diarrhea, melena, nausea and vomiting.  per hpi   OBJECTIVE:  Blood pressure 113/72, pulse 92, temperature 98.4 F (36.9 C), temperature source Oral, height 5\' 3"  (1.6 m), weight 173 lb 9.6 oz (78.7 kg), SpO2 96 %. Body mass index is 30.75 kg/m.   Wt Readings from Last 3 Encounters:  06/16/18 173 lb 9.6 oz (78.7 kg)  05/04/18 175 lb 12.8 oz (79.7 kg)  04/26/18 174 lb (78.9 kg)    Physical Exam  Constitutional: She is oriented to person, place, and time. She appears well-developed and well-nourished.  HENT:  Head: Normocephalic and atraumatic.  Mouth/Throat: Oropharynx is  clear and moist. No oropharyngeal exudate.  Eyes: Pupils are equal, round, and reactive to light. Conjunctivae and EOM are normal. No  scleral icterus.  Neck: Neck supple.  Cardiovascular: Normal rate, regular rhythm and normal heart sounds. Exam reveals no gallop and no friction rub.  No murmur heard. Pulmonary/Chest: Effort normal and breath sounds normal. She has no wheezes. She has no rales.  Musculoskeletal: She exhibits no edema.  Lymphadenopathy:    She has no cervical adenopathy.  Neurological: She is alert and oriented to person, place, and time.  Skin: Skin is warm and dry.  Psychiatric: She has a normal mood and affect.  Nursing note and vitals reviewed.      2 cm plaque with rounded edges    ASSESSMENT and PLAN  1. Skin lesion of face Lesion concerning for SCC, due to location and size referring to derm, mohs preferred.  - Ambulatory referral to Dermatology  2. Essential hypertension Controlled. Continue current regime. Has upcoming appt with cards for followup of CHF  3. Gastroesophageal reflux disease with esophagitis Uncontrolled. Adding carafate prn. Discussed diet.  Referring back to GI  Other orders - Pneumococcal conjugate vaccine 13-valent IM - sucralfate (CARAFATE) 1 GM/10ML suspension; Take 10 mLs (1 g total) by mouth 4 (four) times daily -  with meals and at bedtime.  Return in about 3 months (around 09/16/2018).    Rutherford Guys, MD Primary [facility] at [place], [place] [address] Ph.  [phone] Fax [phone]

## 2018-06-17 ENCOUNTER — Telehealth: Payer: Self-pay

## 2018-06-17 MED ORDER — AMITRIPTYLINE HCL 50 MG PO TABS: 50.0000 mg | ORAL_TABLET | Freq: Every day | ORAL | 1 refills | Status: DC

## 2018-06-17 NOTE — Telephone Encounter (Signed)
Pt has been scheduled for Derm for 12/16 at [facility] to let her know, she does not have a vm set up.

## 2018-06-17 NOTE — Telephone Encounter (Signed)
Patient called and advised the albuterol was sent in on 03/16/18 with 5 refills, she says she wasn't aware, but she is out of Amitriptyline and will need that.

## 2018-06-17 NOTE — Addendum Note (Signed)
Addended by: Rutherford Guys on: 06/17/2018 12:53 PM   Modules accepted: Orders

## 2018-07-18 LAB — CUP PACEART REMOTE DEVICE CHECK
Battery Remaining Longevity: 46 mo
Battery Voltage: 2.92 V
Brady Statistic RV Percent Paced: 1 %
Date Time Interrogation Session: 20191021060016
HighPow Impedance: 83 Ohm
HighPow Impedance: 83 Ohm
Implantable Lead Location: 753860
Lead Channel Pacing Threshold Amplitude: 0.75 V
Lead Channel Sensing Intrinsic Amplitude: 12 mV
Lead Channel Setting Pacing Amplitude: 2.5 V
Lead Channel Setting Sensing Sensitivity: 0.5 mV
MDC IDC LEAD IMPLANT DT: 20130108
MDC IDC MSMT BATTERY REMAINING PERCENTAGE: 40 %
MDC IDC MSMT LEADCHNL RV IMPEDANCE VALUE: 550 Ohm
MDC IDC MSMT LEADCHNL RV PACING THRESHOLD PULSEWIDTH: 0.5 ms
MDC IDC PG IMPLANT DT: 20130108
MDC IDC SET LEADCHNL RV PACING PULSEWIDTH: 0.5 ms
Pulse Gen Serial Number: 1016523

## 2018-07-22 ENCOUNTER — Other Ambulatory Visit: Payer: Self-pay | Admitting: Gastroenterology

## 2018-07-22 DIAGNOSIS — K219 Gastro-esophageal reflux disease without esophagitis: Secondary | ICD-10-CM

## 2018-07-29 ENCOUNTER — Ambulatory Visit (INDEPENDENT_AMBULATORY_CARE_PROVIDER_SITE_OTHER): Payer: Medicare Other | Admitting: Gastroenterology

## 2018-07-29 ENCOUNTER — Encounter: Payer: Medicare Other | Admitting: *Deleted

## 2018-07-29 ENCOUNTER — Encounter: Payer: Self-pay | Admitting: Gastroenterology

## 2018-07-29 VITALS — BP 124/69 | HR 104 | Wt 171.6 lb

## 2018-07-29 VITALS — BP 100/64 | HR 100 | Ht 62.0 in | Wt 170.5 lb

## 2018-07-29 DIAGNOSIS — K5900 Constipation, unspecified: Secondary | ICD-10-CM | POA: Diagnosis not present

## 2018-07-29 DIAGNOSIS — Z006 Encounter for examination for normal comparison and control in clinical research program: Secondary | ICD-10-CM

## 2018-07-29 DIAGNOSIS — R1013 Epigastric pain: Secondary | ICD-10-CM

## 2018-07-29 DIAGNOSIS — K219 Gastro-esophageal reflux disease without esophagitis: Secondary | ICD-10-CM

## 2018-07-29 MED ORDER — FAMOTIDINE 20 MG PO TABS: 20.0000 mg | ORAL_TABLET | Freq: Two times a day (BID) | ORAL | 3 refills | Status: DC

## 2018-07-29 MED ORDER — SUCRALFATE 1 GM/10ML PO SUSP: 1.0000 g | Freq: Three times a day (TID) | ORAL | 1 refills | Status: DC

## 2018-07-29 NOTE — Research (Signed)
BEAT HF 21 Month visit  Pt doing well and is in good sprits. Pt denies SOB, chest pain or dizziness. No medication changes since last visit.  Will see pt back 25/MAR/20 for 24 month visit.

## 2018-07-29 NOTE — Progress Notes (Signed)
HPI :  73 year old female here for follow-up visit. She's been previously followed for reflux and stomach ulcers. Please see details of her workup as outlined below. She had been on Dexilant 60 mg a day for a while now, this has historically controlled her symptoms pretty well. About 3 months ago she's been having some breakthrough that bother her more frequently. She denies any dysphagia. No nausea or vomiting. She had been given some Carafate which she's taken as needed, takes it maybe once or day, it does help her when she takes it. She denies any other over-the-counter regimens. She is also having some epigastric discomfort which bothers her sometimes at night, when she lies down. She denies having this at all during the daytime. No clear relation to any bowel movements. She takes MiraLAX every day which seems to work well for her constipation. She has a bowel movement every 2 days or so. No blood in her stools. Her epigastric discomfort ongoing for 2 months or so. Of note she's had a graft placed into her aorta in recent months.  She's had a CT angiogram of her abdomen and pelvis in September around the time of her symptoms of abdominal pain, stent in good place from distal arch of aorta into proximal descending thoracic segment, no leak. No other concerning the pathology to account for her symptoms. Pain does not seem related to eating. Prior gastrin level normal.   Endoscopic history: EGD 04/29/2016 - LA grade C esophagitis, polypoid tissue of the antrum, gastritis, superificial duodenal ulcers - path c/w large hyperplastic polyp Colonoscopy 04/29/2016 - small benign nonadenomatous polyp, small AVM, diverticulosis, hemorrhoids  EUS 05/15/2016 - interval healing of ulcers, mucosal thickening of antrum - path c/w reactive gastritis - not hyperplastic EGD 12/09/2016 - interval healing of esophagitis, thickened gastric folds of the antrum - biopsies show reactive gastropathy, 2 duodenal polyps - benign  path    Past Medical History:  Diagnosis Date  . Abdominal pain, left lower quadrant 08/14/2009   Qualifier: Diagnosis of  By: Laney Potash, Pam    . Abnormal CT scan, stomach   . Abnormal LFTs 06/19/2017  . AICD (automatic cardioverter/defibrillator) present    Dr.Allred follows  . Aortic arch pseudoaneurysm (HCC)    a. followed by Dr. Prescott Gum.  . Arthritis   . Asthma   . Benign neoplasm of colon   . CAD (coronary artery disease) 02/2009   a. anterior STEMI rx with BMS to prox LAD in 02/2009. b. ISR s/p PTCA 06/2009. c. ISR s/p thrombectomy & PTCA 03/2010 due to late stent thrombosis.   . Cardiomyopathy, ischemic 06/12/2011  . Chronic renal insufficiency    stage 3  . Chronic systolic CHF (congestive heart failure) ([place])    a. s/p ST. Jude ICD 2011/11/04.  Marland Kitchen Chronic systolic heart failure ([place]) 08/05/2011  . Colon polyp   . Complication of anesthesia   . COPD (chronic obstructive pulmonary disease) ([place])   . CORONARY ARTERY DISEASE 04/24/2009   Qualifier: Diagnosis of  By: Trellis Paganini PA-c, Amy S   . DDD (degenerative disc disease), lumbar 05/16/2011  . Depression    "since my son died in Dec 05, 2022" 03-Nov-2017)  . Diverticulosis   . DIVERTICULOSIS-COLON 08/14/2009   Qualifier: Diagnosis of  By: Trellis Paganini PA-c, Amy S   . DYSPHAGIA UNSPECIFIED 04/24/2009   Qualifier: Diagnosis of  By: Laney Potash, Pam    . Dyspnea    "when I lay down at night"   . Essential hypertension  09/05/2009   Qualifier: Diagnosis of  By: Harvest Dark CMA, Anderson Malta    . Gastric polyp   . GERD 10/19/2007   Qualifier: Diagnosis of  By: Rennie Plowman RN, Blanca Friend: Diagnosis of  By: Laney Potash, Pam    . GERD (gastroesophageal reflux disease)   . GI bleed    a. h/o GIB on DAPT, now on ASA only.  Marland Kitchen Headache 01/03/2013  . Hearing loss    left ear  . History of pulmonary embolus (PE) 01/02/2011  . Hyperlipemia   . Hyperlipidemia, unspecified 04/24/2009   Qualifier: Diagnosis of  By: Trellis Paganini PA-c, Amy S   . Hypertension   .  ICD-St.Jude 08/06/2011   S/p St. Jude ICD placement 08/05/11   . Insomnia   . Irritable bowel syndrome   . Ischemic cardiomyopathy    EF 10-15%  . Memory loss   . Migraine headache without aura   . Myocardial infarct [facility]) 2011 x 2   Dr. Roswell Nickel   . PERSONAL HX COLONIC POLYPS 04/24/2009   Qualifier: Diagnosis of  By: Shane Crutch, Amy S November, 2011 colonoscopy demonstrated a sessile cecal polyp and sigmoid polyp   . Pneumonia   . PONV (postoperative nausea and vomiting)   . Pre-diabetes   . Pseudoaneurysm of aortic arch ([place]) 05/05/2012  . Pulmonary embolism ([place]) 2011  . Stroke [facility])    'When I was young." no residual  . Thoracic aortic aneurysm ([place]) 03/31/2018  . TOBACCO ABUSE 04/04/2009   Qualifier: Diagnosis of  By: Arvid Right  Qualifier: Diagnosis of  By: [name], MD, Jaquelyn Bitter   . Transfusion history    ?'12 or '13  . UNSPECIFIED ANEMIA 07/03/2010   Qualifier: Diagnosis of  By: Shelda Pal    . Unspecified mastoiditis      Past Surgical History:  Procedure Laterality Date  . ANGIOPLASTY  07/02/09, 04/01/10  . BILATERAL SALPINGOOPHORECTOMY    . CAD( bare metal stent)  02/2009   x 1  . CAROTID-SUBCLAVIAN BYPASS GRAFT Left 03/31/2018   Procedure: LEFT SUBCLAVIAN ARTERY BYPASS GRAFT;  Surgeon: Serafina Mitchell, MD;  Location: [place];  Service: Vascular;  Laterality: Left;  . CERVICAL SPINE SURGERY  08/08  . COLONOSCOPY WITH PROPOFOL N/A 04/29/2016   Procedure: COLONOSCOPY WITH PROPOFOL;  Surgeon: [name], MD;  [facility];  Service: Gastroenterology;  Laterality: N/A;  . ESOPHAGOGASTRODUODENOSCOPY N/A 12/09/2016   Procedure: ESOPHAGOGASTRODUODENOSCOPY (EGD);  Surgeon: [name], MD;  Location: [facility];  Service: Gastroenterology;  Laterality: N/A;  . ESOPHAGOGASTRODUODENOSCOPY (EGD) WITH PROPOFOL N/A 04/29/2016   Procedure: ESOPHAGOGASTRODUODENOSCOPY (EGD) WITH PROPOFOL;  Surgeon: [name], MD;  [facility];  Service: Gastroenterology;  Laterality: N/A;  . EUS N/A 05/15/2016   Procedure: UPPER ENDOSCOPIC ULTRASOUND (EUS) RADIAL;  Surgeon: Milus Banister, MD;  [facility];  Service: Endoscopy;  Laterality: N/A;  . IMPLANTABLE CARDIOVERTER DEFIBRILLATOR IMPLANT N/A 08/05/2011   Primary prevention SJM ICD implanted,  Analyze ST study patient  . INNER EAR SURGERY     left x 17  . LUMBAR DISC SURGERY  02/2008   fusion  . NASAL SEPTUM SURGERY    . THORACIC AORTIC ENDOVASCULAR STENT GRAFT N/A 03/31/2018   Procedure: THORACIC AORTIC ENDOVASCULAR STENT GRAFT;  Surgeon: Serafina Mitchell, MD;  [facility];  Service: Vascular;  Laterality: N/A;  . TOTAL ABDOMINAL HYSTERECTOMY     complete   Family History  Problem Relation Age of  Onset  . Colon cancer Mother   . Colon cancer Brother   . Bladder Cancer Brother   . Breast cancer Cousin   . Cancer Son    Social History   Tobacco Use  . Smoking status: Current Some Day Smoker    Packs/day: 0.13    Years: 30.00    Pack years: 3.90    Types: Cigarettes  . Smokeless tobacco: Never Used  . Tobacco comment: Restarted smoking.  few cig. daily  Substance Use Topics  . Alcohol use: No    Alcohol/week: 0.0 standard drinks  . Drug use: No   Current Outpatient Medications  Medication Sig Dispense Refill  . albuterol (PROAIR HFA) 108 (90 Base) MCG/ACT inhaler Inhale 2 puffs into the lungs every 6 (six) hours as needed. For shortness of breath 18 g 5  . amitriptyline (ELAVIL) 50 MG tablet Take 1 tablet (50 mg total) by mouth at bedtime. 90 tablet 1  . aspirin 81 MG tablet Take 81 mg by mouth every morning.     . bisoprolol (ZEBETA) 5 MG tablet TAKE 1 TABLET (5 MG TOTAL) BY MOUTH DAILY. 90 tablet 3  . DEXILANT 60 MG capsule TAKE 1 CAPSULE BY MOUTH DAILY. 30 capsule 1  . dicyclomine (BENTYL) 10 MG capsule TAKE 1 CAPSULE BY MOUTH EVERY 8 HOURS AS NEEDED FOR SPASMS. 30 capsule 3  . ENTRESTO 24-26 MG TAKE  1 TABLET BY MOUTH 2 TIMES DAILY. 180 tablet 3  . furosemide (LASIX) 20 MG tablet Take 2 tablets (40 mg total) by mouth daily. 180 tablet 3  . mupirocin ointment (BACTROBAN) 2 % APPLY TOPICALLY INSIDE EACH NOSTRIL BID FOR 5 DAYS  0  . polyethylene glycol powder (GLYCOLAX/MIRALAX) powder Take 17 g by mouth daily as needed for mild constipation. 255 g 11  . PREMARIN vaginal cream   12  . rosuvastatin (CRESTOR) 20 MG tablet TAKE 1 TABLET BY MOUTH AT BEDTIME. 90 tablet 3  . sucralfate (CARAFATE) 1 GM/10ML suspension Take 10 mLs (1 g total) by mouth 4 (four) times daily -  with meals and at bedtime. 420 mL 0  . topiramate (TOPAMAX) 50 MG tablet Take 50 mg by mouth daily.     No current facility-administered medications for this visit.    Allergies  Allergen Reactions  . Lisinopril     cough  . Sulfa Antibiotics Other (See Comments)    Unknown, childhood allergy   . Sulfonamide Derivatives Other (See Comments)    UNSURE     Review of Systems: All systems reviewed and negative except where noted in HPI.   Lab Results  Component Value Date   WBC 8.4 04/15/2018   HGB 12.9 04/15/2018   HCT 39.1 04/15/2018   MCV 94.3 04/15/2018   PLT 150 04/01/2018    Lab Results  Component Value Date   CREATININE 1.68 (H) 05/12/2018   BUN 23 05/12/2018   NA 143 05/12/2018   K 4.4 05/12/2018   CL 104 05/12/2018   CO2 19 (L) 05/12/2018    Lab Results  Component Value Date   ALT 17 03/26/2018   AST 23 03/26/2018   ALKPHOS 103 03/26/2018   BILITOT 0.8 03/26/2018     Physical Exam: BP 100/64 (BP Location: Left Arm, Patient Position: Sitting, Cuff Size: Normal)   Pulse 100   Ht 5\' 2"  (1.575 m) Comment: height measured without shoes  Wt 170 lb 8 oz (77.3 kg)   BMI 31.18 kg/m  Constitutional: Pleasant,well-developed, female in  no acute distress. HEENT: Normocephalic and atraumatic. Conjunctivae are normal. No scleral icterus. Neck supple.  Cardiovascular: Normal rate, regular rhythm.    Pulmonary/chest: Effort normal and breath sounds normal. No wheezing, rales or rhonchi. Abdominal: Soft, protuberant, unable to reproduce symptoms, nontender, negative [place]. There are no masses palpable. No hepatomegaly. Extremities: no edema Lymphadenopathy: No cervical adenopathy noted. Neurological: Alert and oriented to person place and time. Skin: Skin is warm and dry. No rashes noted. Psychiatric: Normal mood and affect. Behavior is normal.   ASSESSMENT AND PLAN: 73 year old female here for reassessment of the following issues:  GERD / epigastric pain - history of esophagitis with gastric ulcers, led to use of Dexilant to control symptoms in the past. Her last endoscopy showed interval healing. She's had an EUS to evaluate polypoid lesion of the antrum in the past, thought to be benign gastric thickening. For the most part had been doing well until recent months with some breakthrough reflux. Now with some epigastric nocturnal symptoms as well. Her imaging was done at the time of symptom onset, which didn't show any significant pathology. I reassured her this light. Not sure if the epigastric discomfort is a manifestation of her reflux. We will continue Dexilant as currently dosed. Recommend she take her Carafate every night before bed, sleep with the head of the bed elevated and avoid eating prior to bedtime. We'll also add Pepcid 20 mg twice a day. We'll see how she does in this regimen. She will contact me in a few weeks if no improvement.  Constipation - Miralax working well to control symptoms, she will continue this for now.  [place] Cellar, MD [facility]  CC: Rutherford Guys, MD

## 2018-07-29 NOTE — Patient Instructions (Addendum)
If you are age 71 or older, your body mass index should be between 23-30. Your Body mass index is 31.18 kg/m. If this is out of the aforementioned range listed, please consider follow up with your Primary Care Provider.  If you are age 5 or younger, your body mass index should be between 19-25. Your Body mass index is 31.18 kg/m. If this is out of the aformentioned range listed, please consider follow up with your Primary Care Provider.   Continue on Dexilant.  We have sent the following medications to your pharmacy for you to pick up at your convenience: Pepcid 20mg : Take 1 tablet by mouth twice a day  Carafate- take as directed  We are giving you a hand out regarding Reflux today.  Thank you for entrusting me with your care and for choosing [facility], Dr. Gotham Cellar

## 2018-08-03 NOTE — Progress Notes (Signed)
[facility]  PATIENT: [name] DOB: May 28, 1946   REASON FOR VISIT: Follow-up for migraine HISTORY FROM: Patient    HISTORY OF PRESENT ILLNESS: [name] is a 73 year old right-handed white divorced female with a history of headaches and essential tremor. Patient of Dr. Erling Cruz,  She has history of multiple operations on the left mastoid between the ages of 37 and 18. She is currently on disability for that reason. She has CAD, PE, she has congestive heart failure, COPD, irritable bowel syndrome, left ankle fracture, hyperlipidemia, acute renal failure with stage III chronic renal disease, metabolic acidosis from Topamax, pulmonary embolism 03/2010, and a defibrillator was placed 08/2011.  She has history of migraine since age 38s, she complains of vertex area pressure headache, she has 2-6 headaches each month, She is taking amitriptyline 50mg  qhs, which has been helpful. She took overcounter medication for nause, as needed, her headache can last all day long, ASA make her stomach hurt,  She brought her headache diarrhea, she had 4-6 headache each month,  UPDATE 08/26/16 CM. [name], 73 year old female returns for follow-up for headaches. She reports 1 bad migraine in the last year and an additional 4 headaches since last seen. She is currently on Topamax 50 mg daily. She does not take an acute medication. She is not aware of any foods that are triggers. She will occasionally have nausea and vomiting with her headache.She returns for reevaluation. UPDATE 1/8/2019CM [name], 73 year old female returns for follow-up with a history of headaches.  She reports 4 bad headaches in the last year.  She is currently on Topamax 50 mg daily and has tried to taper the medication but her headaches return.  She is not on any acute medication she is not aware of any specific migraine triggers.  She occasionally will have nausea and vomiting with her headaches.  She does not wish to  titrate her Topamax at this time.  She returns for reevaluation UPDATE 1/9/2020CM [name], 73 year old female returns for follow-up with a history of headaches.  She reports more headaches in the past year but her son died from kidney cancer in 12/12/2017 and she thinks it is due to the stress from that.  She has not had any bereavement therapy and was encouraged to do so.  She is currently on Topamax 50 mg daily and she does not wish to go up on the medication.  She has tried to taper the medication in the past but was unsuccessful.  She had thoracic aneurysm repair in September 2019.  She has just started back to driving.  Her restrictions have been lifted.  She returns for reevaluation REVIEW OF SYSTEMS: Full 14 system review of systems performed and notable only for those listed, all others are neg:  Constitutional: neg  Cardiovascular: neg Ear/Nose/Throat: hearing loss Skin: neg Eyes: neg Respiratory: neg Gastroitestinal: constipation Hematology/Lymphatic: neg  Endocrine: neg Musculoskeletal:neg Allergy/Immunology: neg Neurological: headache  Psychiatric: neg Sleep : neg   ALLERGIES: Allergies  Allergen Reactions  . Lisinopril     cough  . Sulfa Antibiotics Other (See Comments)    Unknown, childhood allergy   . Sulfonamide Derivatives Other (See Comments)    UNSURE    HOME MEDICATIONS: Outpatient Medications Prior to Visit  Medication Sig Dispense Refill  . albuterol (PROAIR HFA) 108 (90 Base) MCG/ACT inhaler Inhale 2 puffs into the lungs every 6 (six) hours as needed. For shortness of breath 18 g 5  . amitriptyline (ELAVIL) 50 MG tablet  Take 1 tablet (50 mg total) by mouth at bedtime. 90 tablet 1  . aspirin 81 MG tablet Take 81 mg by mouth every morning.     . bisoprolol (ZEBETA) 5 MG tablet TAKE 1 TABLET (5 MG TOTAL) BY MOUTH DAILY. 90 tablet 3  . DEXILANT 60 MG capsule TAKE 1 CAPSULE BY MOUTH DAILY. 30 capsule 1  . dicyclomine (BENTYL) 10 MG capsule TAKE 1 CAPSULE BY  MOUTH EVERY 8 HOURS AS NEEDED FOR SPASMS. 30 capsule 3  . ENTRESTO 24-26 MG TAKE 1 TABLET BY MOUTH 2 TIMES DAILY. 180 tablet 3  . famotidine (PEPCID) 20 MG tablet Take 1 tablet (20 mg total) by mouth 2 (two) times daily. 60 tablet 3  . mupirocin ointment (BACTROBAN) 2 % APPLY TOPICALLY INSIDE EACH NOSTRIL BID FOR 5 DAYS  0  . polyethylene glycol powder (GLYCOLAX/MIRALAX) powder Take 17 g by mouth daily as needed for mild constipation. 255 g 11  . PREMARIN vaginal cream   12  . rosuvastatin (CRESTOR) 20 MG tablet TAKE 1 TABLET BY MOUTH AT BEDTIME. 90 tablet 3  . sucralfate (CARAFATE) 1 GM/10ML suspension Take 10 mLs (1 g total) by mouth 4 (four) times daily -  with meals and at bedtime. 420 mL 1  . topiramate (TOPAMAX) 50 MG tablet Take 50 mg by mouth daily.    . furosemide (LASIX) 20 MG tablet Take 2 tablets (40 mg total) by mouth daily. 180 tablet 3   No facility-administered medications prior to visit.     PAST MEDICAL HISTORY: Past Medical History:  Diagnosis Date  . Abdominal pain, left lower quadrant 08/14/2009   Qualifier: Diagnosis of  By: Laney Potash, Pam    . Abnormal CT scan, stomach   . Abnormal LFTs 06/19/2017  . AICD (automatic cardioverter/defibrillator) present    Dr.Allred follows  . Aortic arch pseudoaneurysm (HCC)    a. followed by Dr. Prescott Gum.  . Arthritis   . Asthma   . Benign neoplasm of colon   . CAD (coronary artery disease) 02/2009   a. anterior STEMI rx with BMS to prox LAD in 02/2009. b. ISR s/p PTCA 06/2009. c. ISR s/p thrombectomy & PTCA 03/2010 due to late stent thrombosis.   . Cardiomyopathy, ischemic 06/12/2011  . Chronic renal insufficiency    stage 3  . Chronic systolic CHF (congestive heart failure) ([place])    a. s/p ST. Jude ICD 2011-10-28.  Marland Kitchen Chronic systolic heart failure ([place]) 08/05/2011  . Colon polyp   . Complication of anesthesia   . COPD (chronic obstructive pulmonary disease) ([place])   . CORONARY ARTERY DISEASE 04/24/2009   Qualifier: Diagnosis of   By: Trellis Paganini PA-c, Amy S   . DDD (degenerative disc disease), lumbar 05/16/2011  . Depression    "since my son died in 11-28-2022" 10-27-2017)  . Diverticulosis   . DIVERTICULOSIS-COLON 08/14/2009   Qualifier: Diagnosis of  By: Trellis Paganini PA-c, Amy S   . DYSPHAGIA UNSPECIFIED 04/24/2009   Qualifier: Diagnosis of  By: Laney Potash, Pam    . Dyspnea    "when I lay down at night"   . Essential hypertension 09/05/2009   Qualifier: Diagnosis of  By: Harvest Dark CMA, Anderson Malta    . Gastric polyp   . GERD 10/19/2007   Qualifier: Diagnosis of  By: Rennie Plowman RN, Blanca Friend: Diagnosis of  By: Laney Potash, Pam    . GERD (gastroesophageal reflux disease)   . GI bleed    a. h/o GIB on  DAPT, now on ASA only.  Marland Kitchen Headache 01/03/2013  . Hearing loss    left ear  . History of pulmonary embolus (PE) 01/02/2011  . Hyperlipemia   . Hyperlipidemia, unspecified 04/24/2009   Qualifier: Diagnosis of  By: Trellis Paganini PA-c, Amy S   . Hypertension   . ICD-St.Jude 08/06/2011   S/p St. Jude ICD placement 08/05/11   . Insomnia   . Irritable bowel syndrome   . Ischemic cardiomyopathy    EF 10-15%  . Memory loss   . Migraine headache without aura   . Myocardial infarct [facility]) 2011 x 2   Dr. Roswell Nickel   . PERSONAL HX COLONIC POLYPS 04/24/2009   Qualifier: Diagnosis of  By: Shane Crutch, Amy S November, 2011 colonoscopy demonstrated a sessile cecal polyp and sigmoid polyp   . Pneumonia   . PONV (postoperative nausea and vomiting)   . Pre-diabetes   . Pseudoaneurysm of aortic arch ([place]) 05/05/2012  . Pulmonary embolism ([place]) 2011  . Stroke [facility])    'When I was young." no residual  . Thoracic aortic aneurysm ([place]) 03/31/2018  . TOBACCO ABUSE 04/04/2009   Qualifier: Diagnosis of  By: Arvid Right  Qualifier: Diagnosis of  By: [name], MD, Jaquelyn Bitter   . Transfusion history    ?'12 or '13  . UNSPECIFIED ANEMIA 07/03/2010   Qualifier: Diagnosis of  By: Shelda Pal    . Unspecified mastoiditis      PAST SURGICAL HISTORY: Past Surgical History:  Procedure Laterality Date  . ANGIOPLASTY  07/02/09, 04/01/10  . BILATERAL SALPINGOOPHORECTOMY    . CAD( bare metal stent)  02/2009   x 1  . CAROTID-SUBCLAVIAN BYPASS GRAFT Left 03/31/2018   Procedure: LEFT SUBCLAVIAN ARTERY BYPASS GRAFT;  Surgeon: Serafina Mitchell, MD;  Location: [place];  Service: Vascular;  Laterality: Left;  . CERVICAL SPINE SURGERY  08/08  . COLONOSCOPY WITH PROPOFOL N/A 04/29/2016   Procedure: COLONOSCOPY WITH PROPOFOL;  Surgeon: [name], MD;  Location: [facility];  Service: Gastroenterology;  Laterality: N/A;  . ESOPHAGOGASTRODUODENOSCOPY N/A 12/09/2016   Procedure: ESOPHAGOGASTRODUODENOSCOPY (EGD);  Surgeon: [name], MD;  Location: [facility];  Service: Gastroenterology;  Laterality: N/A;  . ESOPHAGOGASTRODUODENOSCOPY (EGD) WITH PROPOFOL N/A 04/29/2016   Procedure: ESOPHAGOGASTRODUODENOSCOPY (EGD) WITH PROPOFOL;  Surgeon: [name], MD;  Location: [facility];  Service: Gastroenterology;  Laterality: N/A;  . EUS N/A 05/15/2016   Procedure: UPPER ENDOSCOPIC ULTRASOUND (EUS) RADIAL;  Surgeon: Milus Banister, MD;  Location: [facility];  Service: Endoscopy;  Laterality: N/A;  . IMPLANTABLE CARDIOVERTER DEFIBRILLATOR IMPLANT N/A 08/05/2011   Primary prevention SJM ICD implanted,  Analyze ST study patient  . INNER EAR SURGERY     left x 17  . LUMBAR DISC SURGERY  02/2008   fusion  . NASAL SEPTUM SURGERY    . THORACIC AORTIC ENDOVASCULAR STENT GRAFT N/A 03/31/2018   Procedure: THORACIC AORTIC ENDOVASCULAR STENT GRAFT;  Surgeon: Serafina Mitchell, MD;  Location: [facility];  Service: Vascular;  Laterality: N/A;  . TOTAL ABDOMINAL HYSTERECTOMY     complete    FAMILY HISTORY: Family History  Problem Relation Age of Onset  . Colon cancer Mother   . Colon cancer Brother   . Bladder Cancer Brother   . Breast cancer Cousin   . Cancer Son     SOCIAL HISTORY: Social History    Socioeconomic History  . Marital status: Divorced    Spouse name: Not on file  . Number  of children: 2  . Years of education: 12  . Highest education level: Not on file  Occupational History  . Occupation: Merchandiser, retail: UNEMPLOYED    Employer: DISABLED  Social Needs  . Financial resource strain: Not on file  . Food insecurity:    Worry: Not on file    Inability: Not on file  . Transportation needs:    Medical: Not on file    Non-medical: Not on file  Tobacco Use  . Smoking status: Current Some Day Smoker    Packs/day: 0.13    Years: 30.00    Pack years: 3.90    Types: Cigarettes  . Smokeless tobacco: Never Used  . Tobacco comment: Restarted smoking.  few cig. daily  Substance and Sexual Activity  . Alcohol use: No    Alcohol/week: 0.0 standard drinks  . Drug use: No  . Sexual activity: Not on file  Lifestyle  . Physical activity:    Days per week: Not on file    Minutes per session: Not on file  . Stress: Not on file  Relationships  . Social connections:    Talks on phone: Not on file    Gets together: Not on file    Attends religious service: Not on file    Active member of club or organization: Not on file    Attends meetings of clubs or organizations: Not on file    Relationship status: Not on file  . Intimate partner violence:    Fear of current or ex partner: Not on file    Emotionally abused: Not on file    Physically abused: Not on file    Forced sexual activity: Not on file  Other Topics Concern  . Not on file  Social History Narrative   Pt lives in [place] alone.  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   Patient has 11 th grade education.Right handed.   Caffeine- one cup daily           PHYSICAL EXAM  Vitals:   08/05/18 1428  BP: 110/77  Pulse: 70  Weight: 172 lb 12.8 oz (78.4 kg)  Height: 5\' 2"  (1.575 m)   Body mass index is 31.61 kg/m.  Generalized: Well developed,obese female  in no acute distress  Head:  normocephalic and atraumatic,. Oropharynx benign  Neck: Supple,  Musculoskeletal: No deformity   Neurological examination   Mentation: Alert oriented to time, place, history taking. Attention span and concentration appropriate. Recent and remote memory intact.  Follows all commands speech and language fluent.   Cranial nerve II-XII: Pupils were equal round reactive to light extraocular movements were full, visual field were full on confrontational test. Facial sensation and strength were normal. Hard of hearing, left greater than right. Uvula tongue midline. head turning and shoulder shrug were normal and symmetric.Tongue protrusion into cheek strength was normal. Motor: normal bulk and tone, full strength in the BUE, BLE, Sensory intact to touch pinprick and vibratory in the upper and lower extremities Coordination: finger-nose-finger, heel-to-shin bilaterally, no dysmetria Reflexes: symmetric upper and lower plantar responses were flexor bilaterally. Gait and Station: Rising up from seated position without assistance, normal stance,  moderate stride, good arm swing, smooth turning, able to perform tiptoe, and heel walking without difficulty. Tandem gait is unsteady. No assistive device  DIAGNOSTIC DATA (LABS, IMAGING, TESTING) - I reviewed patient records, labs, notes, testing and imaging myself where available.  Lab Results  Component Value Date   WBC 8.4  04/15/2018   HGB 12.9 04/15/2018   HCT 39.1 04/15/2018   MCV 94.3 04/15/2018   PLT 150 04/01/2018      Component Value Date/Time   NA 143 05/12/2018 1212   K 4.4 05/12/2018 1212   CL 104 05/12/2018 1212   CO2 19 (L) 05/12/2018 1212   GLUCOSE 64 (L) 05/12/2018 1212   GLUCOSE 122 (H) 04/01/2018 0321   BUN 23 05/12/2018 1212   CREATININE 1.68 (H) 05/12/2018 1212   CREATININE 1.56 (H) 01/15/2016 1345   CALCIUM 9.3 05/12/2018 1212   PROT 7.4 03/26/2018 1408   PROT 7.1 12/18/2017 1444   ALBUMIN 3.9 03/26/2018 1408   ALBUMIN 4.4  12/18/2017 1444   AST 23 03/26/2018 1408   ALT 17 03/26/2018 1408   ALKPHOS 103 03/26/2018 1408   BILITOT 0.8 03/26/2018 1408   BILITOT 0.3 12/18/2017 1444   GFRNONAA 31 (L) 05/12/2018 1212   GFRAA 35 (L) 05/12/2018 1212     Lab Results  Component Value Date   TSH 1.250 06/08/2017      ASSESSMENT AND PLAN  73 y.o. year old female  has a past medical history migraine headaches. Also past medical history of hyperlipidemia, coronary artery disease status post pacemaker insertion.She has had increasing migraines over the past year.  She reports that her son died in 2022-12-07 from kidney cancer.   She has tried to taper her Topamax but her headaches return.  She is not interested in increasing the dose of Topamax at this point.  PLAN: Continue Topamax 50mg  daily will refill for one year Suggested bereavement at hospice for the loss of her son  follow-up yearly and when necessary Dennie Bible, [facility], [facility], [facility] [address], [place], [place] [address] [phone]

## 2018-08-05 ENCOUNTER — Ambulatory Visit (INDEPENDENT_AMBULATORY_CARE_PROVIDER_SITE_OTHER): Payer: Medicare Other | Admitting: Nurse Practitioner

## 2018-08-05 ENCOUNTER — Encounter: Payer: Self-pay | Admitting: Nurse Practitioner

## 2018-08-05 VITALS — BP 110/77 | HR 70 | Ht 62.0 in | Wt 172.8 lb

## 2018-08-05 DIAGNOSIS — G43009 Migraine without aura, not intractable, without status migrainosus: Secondary | ICD-10-CM | POA: Diagnosis not present

## 2018-08-05 DIAGNOSIS — F4321 Adjustment disorder with depressed mood: Secondary | ICD-10-CM | POA: Diagnosis not present

## 2018-08-05 DIAGNOSIS — Z634 Disappearance and death of family member: Secondary | ICD-10-CM

## 2018-08-05 MED ORDER — TOPIRAMATE 50 MG PO TABS: 50.0000 mg | ORAL_TABLET | Freq: Every day | ORAL | 3 refills | Status: DC

## 2018-08-05 NOTE — Progress Notes (Signed)
I have reviewed and agreed above plan. 

## 2018-08-05 NOTE — Patient Instructions (Signed)
Continue Topamax 50mg  daily will refill for one year Suggested bereavement at hospice for the loss of her son  follow-up yearly and when necessary

## 2018-08-16 ENCOUNTER — Ambulatory Visit (INDEPENDENT_AMBULATORY_CARE_PROVIDER_SITE_OTHER): Payer: Medicare Other

## 2018-08-16 DIAGNOSIS — I255 Ischemic cardiomyopathy: Secondary | ICD-10-CM

## 2018-08-16 DIAGNOSIS — I5022 Chronic systolic (congestive) heart failure: Secondary | ICD-10-CM

## 2018-08-17 NOTE — Progress Notes (Signed)
Remote ICD transmission.   

## 2018-08-18 LAB — CUP PACEART REMOTE DEVICE CHECK
Battery Remaining Percentage: 37 %
Brady Statistic RV Percent Paced: 1 %
HIGH POWER IMPEDANCE MEASURED VALUE: 80 Ohm
HighPow Impedance: 80 Ohm
Implantable Lead Implant Date: 20130108
Implantable Lead Location: 753860
Lead Channel Impedance Value: 540 Ohm
Lead Channel Pacing Threshold Amplitude: 0.75 V
Lead Channel Pacing Threshold Pulse Width: 0.5 ms
Lead Channel Setting Pacing Pulse Width: 0.5 ms
Lead Channel Setting Sensing Sensitivity: 0.5 mV
MDC IDC MSMT BATTERY REMAINING LONGEVITY: 43 mo
MDC IDC MSMT BATTERY VOLTAGE: 2.92 V
MDC IDC MSMT LEADCHNL RV SENSING INTR AMPL: 12 mV
MDC IDC PG IMPLANT DT: 20130108
MDC IDC SESS DTM: 20200121065043
MDC IDC SET LEADCHNL RV PACING AMPLITUDE: 2.5 V
Pulse Gen Serial Number: 1016523

## 2018-08-25 ENCOUNTER — Other Ambulatory Visit: Payer: Self-pay | Admitting: Cardiovascular Disease

## 2018-09-02 ENCOUNTER — Other Ambulatory Visit: Payer: Self-pay

## 2018-09-02 DIAGNOSIS — I6523 Occlusion and stenosis of bilateral carotid arteries: Secondary | ICD-10-CM

## 2018-09-02 DIAGNOSIS — I712 Thoracic aortic aneurysm, without rupture, unspecified: Secondary | ICD-10-CM

## 2018-09-27 ENCOUNTER — Other Ambulatory Visit: Payer: Self-pay | Admitting: Gastroenterology

## 2018-09-27 DIAGNOSIS — K219 Gastro-esophageal reflux disease without esophagitis: Secondary | ICD-10-CM

## 2018-09-29 ENCOUNTER — Ambulatory Visit (INDEPENDENT_AMBULATORY_CARE_PROVIDER_SITE_OTHER): Payer: Medicare Other | Admitting: Cardiovascular Disease

## 2018-09-29 ENCOUNTER — Encounter: Payer: Self-pay | Admitting: Cardiovascular Disease

## 2018-09-29 VITALS — BP 138/86 | HR 82 | Ht 62.0 in | Wt 172.8 lb

## 2018-09-29 DIAGNOSIS — E782 Mixed hyperlipidemia: Secondary | ICD-10-CM

## 2018-09-29 DIAGNOSIS — I2510 Atherosclerotic heart disease of native coronary artery without angina pectoris: Secondary | ICD-10-CM

## 2018-09-29 DIAGNOSIS — I10 Essential (primary) hypertension: Secondary | ICD-10-CM | POA: Diagnosis not present

## 2018-09-29 DIAGNOSIS — I5022 Chronic systolic (congestive) heart failure: Secondary | ICD-10-CM

## 2018-09-29 MED ORDER — SACUBITRIL-VALSARTAN 49-51 MG PO TABS: 1.0000 | ORAL_TABLET | Freq: Two times a day (BID) | ORAL | 11 refills | Status: AC

## 2018-09-29 NOTE — Progress Notes (Signed)
Cardiology Office Note:    Date:  09/29/2018   ID:  [name], DOB 1946-06-11, MRN [id number]  PCP:  Rutherford Guys, MD  Cardiologist:  [name], MD  Electrophysiologist:  None   Referring MD: Rutherford Guys, MD   Chief Complaint  Patient presents with  . Shortness of Breath   History of Present Illness:    [name] is a 73 y.o. female with a complicated past medical history, presenting for follow-up evaluation today.  The patient is here alone.  She has a history of large anterior wall MI in 2008/10/26 treated with bare-metal stenting of the LAD followed by repeated interventions to treat in-stent restenosis.  She has a severe underlying ischemic cardiomyopathy and is undergone ICD implantation.  The patient has a history of COPD, chronic kidney disease, mixed hyperlipidemia, and large penetrating ulcer of the thoracic aorta.  She underwent endovascular repair of her thoracic aortic penetrating ulcer in 5916 without complication.  She did have some volume overload and heart failure exacerbation when she was seen back in follow-up after her surgery.  Furosemide was increased and she returned to her baseline symptoms.  The patient complains of shortness of breath with physical activity.  This is stable over time.  She has occasional orthopnea and PND but not on a regular basis.  She denies leg swelling.  She denies any recent chest pain or pressure.  She has no lightheadedness or syncope.  Past Medical History:  Diagnosis Date  . Abdominal pain, left lower quadrant 08/14/2009   Qualifier: Diagnosis of  By: Laney Potash, Pam    . Abnormal CT scan, stomach   . Abnormal LFTs 06/19/2017  . AICD (automatic cardioverter/defibrillator) present    Dr.Allred follows  . Aortic arch pseudoaneurysm (HCC)    a. followed by Dr. Prescott Gum.  . Arthritis   . Asthma   . Benign neoplasm of colon   . CAD (coronary artery disease) 02/2009   a. anterior STEMI rx with BMS to prox LAD in  02/2009. b. ISR s/p PTCA 06/2009. c. ISR s/p thrombectomy & PTCA 03/2010 due to late stent thrombosis.   . Cardiomyopathy, ischemic 06/12/2011  . Chronic renal insufficiency    stage 3  . Chronic systolic CHF (congestive heart failure) ([place])    a. s/p ST. Jude ICD 2011-10-27.  Marland Kitchen Chronic systolic heart failure ([place]) 08/05/2011  . Colon polyp   . Complication of anesthesia   . COPD (chronic obstructive pulmonary disease) ([place])   . CORONARY ARTERY DISEASE 04/24/2009   Qualifier: Diagnosis of  By: Trellis Paganini PA-c, Amy S   . DDD (degenerative disc disease), lumbar 05/16/2011  . Depression    "since my son died in 2022/11/27" 10-26-17)  . Diverticulosis   . DIVERTICULOSIS-COLON 08/14/2009   Qualifier: Diagnosis of  By: Trellis Paganini PA-c, Amy S   . DYSPHAGIA UNSPECIFIED 04/24/2009   Qualifier: Diagnosis of  By: Laney Potash, Pam    . Dyspnea    "when I lay down at night"   . Essential hypertension 09/05/2009   Qualifier: Diagnosis of  By: Harvest Dark CMA, Anderson Malta    . Gastric polyp   . GERD 10/19/2007   Qualifier: Diagnosis of  By: Rennie Plowman RN, Blanca Friend: Diagnosis of  By: Laney Potash, Pam    . GERD (gastroesophageal reflux disease)   . GI bleed    a. h/o GIB on DAPT, now on ASA only.  Marland Kitchen Headache 01/03/2013  . Hearing loss  left ear  . History of pulmonary embolus (PE) 01/02/2011  . Hyperlipemia   . Hyperlipidemia, unspecified 04/24/2009   Qualifier: Diagnosis of  By: Trellis Paganini PA-c, Amy S   . Hypertension   . ICD-St.Jude 08/06/2011   S/p St. Jude ICD placement 08/05/11   . Insomnia   . Irritable bowel syndrome   . Ischemic cardiomyopathy    EF 10-15%  . Memory loss   . Migraine headache without aura   . Myocardial infarct [facility]) 2011 x 2   Dr. Roswell Nickel   . PERSONAL HX COLONIC POLYPS 04/24/2009   Qualifier: Diagnosis of  By: Shane Crutch, Amy S November, 2011 colonoscopy demonstrated a sessile cecal polyp and sigmoid polyp   . Pneumonia   . PONV (postoperative nausea and vomiting)   .  Pre-diabetes   . Pseudoaneurysm of aortic arch ([place]) 05/05/2012  . Pulmonary embolism ([place]) 2011  . Stroke [facility])    'When I was young." no residual  . Thoracic aortic aneurysm ([place]) 03/31/2018  . TOBACCO ABUSE 04/04/2009   Qualifier: Diagnosis of  By: Arvid Right  Qualifier: Diagnosis of  By: [name], MD, Jaquelyn Bitter   . Transfusion history    ?'12 or '13  . UNSPECIFIED ANEMIA 07/03/2010   Qualifier: Diagnosis of  By: Shelda Pal    . Unspecified mastoiditis     Past Surgical History:  Procedure Laterality Date  . ANGIOPLASTY  07/02/09, 04/01/10  . BILATERAL SALPINGOOPHORECTOMY    . CAD( bare metal stent)  02/2009   x 1  . CAROTID-SUBCLAVIAN BYPASS GRAFT Left 03/31/2018   Procedure: LEFT SUBCLAVIAN ARTERY BYPASS GRAFT;  Surgeon: Serafina Mitchell, MD;  Location: [facility];  Service: Vascular;  Laterality: Left;  . CERVICAL SPINE SURGERY  08/08  . COLONOSCOPY WITH PROPOFOL N/A 04/29/2016   Procedure: COLONOSCOPY WITH PROPOFOL;  Surgeon: [name], MD;  Location: [facility];  Service: Gastroenterology;  Laterality: N/A;  . ESOPHAGOGASTRODUODENOSCOPY N/A 12/09/2016   Procedure: ESOPHAGOGASTRODUODENOSCOPY (EGD);  Surgeon: [name], MD;  Location: [facility];  Service: Gastroenterology;  Laterality: N/A;  . ESOPHAGOGASTRODUODENOSCOPY (EGD) WITH PROPOFOL N/A 04/29/2016   Procedure: ESOPHAGOGASTRODUODENOSCOPY (EGD) WITH PROPOFOL;  Surgeon: [name], MD;  Location: [facility];  Service: Gastroenterology;  Laterality: N/A;  . EUS N/A 05/15/2016   Procedure: UPPER ENDOSCOPIC ULTRASOUND (EUS) RADIAL;  Surgeon: Milus Banister, MD;  Location: [facility];  Service: Endoscopy;  Laterality: N/A;  . IMPLANTABLE CARDIOVERTER DEFIBRILLATOR IMPLANT N/A 08/05/2011   Primary prevention SJM ICD implanted,  Analyze ST study patient  . INNER EAR SURGERY     left x 17  . LUMBAR DISC SURGERY  02/2008   fusion  . NASAL SEPTUM SURGERY    . THORACIC  AORTIC ENDOVASCULAR STENT GRAFT N/A 03/31/2018   Procedure: THORACIC AORTIC ENDOVASCULAR STENT GRAFT;  Surgeon: Serafina Mitchell, MD;  Location: [facility] OR;  Service: Vascular;  Laterality: N/A;  . TOTAL ABDOMINAL HYSTERECTOMY     complete    Current Medications: Current Meds  Medication Sig  . albuterol (PROAIR HFA) 108 (90 Base) MCG/ACT inhaler Inhale 2 puffs into the lungs every 6 (six) hours as needed. For shortness of breath  . amitriptyline (ELAVIL) 50 MG tablet Take 1 tablet (50 mg total) by mouth at bedtime.  Marland Kitchen aspirin 81 MG tablet Take 81 mg by mouth every morning.   . bisoprolol (ZEBETA) 5 MG tablet TAKE 1 TABLET (5 MG TOTAL) BY MOUTH DAILY.  Marland Kitchen dexlansoprazole (DEXILANT) 60  MG capsule Take 1 capsule (60 mg total) by mouth daily.  Marland Kitchen dicyclomine (BENTYL) 10 MG capsule TAKE 1 CAPSULE BY MOUTH EVERY 8 HOURS AS NEEDED FOR SPASMS.  . famotidine (PEPCID) 20 MG tablet Take 1 tablet (20 mg total) by mouth 2 (two) times daily.  . furosemide (LASIX) 20 MG tablet Take 2 tablets (40 mg total) by mouth daily.  . polyethylene glycol powder (GLYCOLAX/MIRALAX) powder Take 17 g by mouth daily as needed for mild constipation.  Marland Kitchen PREMARIN vaginal cream   . rosuvastatin (CRESTOR) 20 MG tablet TAKE 1 TABLET BY MOUTH AT BEDTIME.  . sucralfate (CARAFATE) 1 GM/10ML suspension Take 10 mLs (1 g total) by mouth 4 (four) times daily -  with meals and at bedtime.  . topiramate (TOPAMAX) 50 MG tablet Take 1 tablet (50 mg total) by mouth daily.  . [DISCONTINUED] ENTRESTO 24-26 MG TAKE 1 TABLET BY MOUTH 2 TIMES DAILY.     Allergies:   Lisinopril; Sulfa antibiotics; and Sulfonamide derivatives   Social History   Socioeconomic History  . Marital status: Divorced    Spouse name: Not on file  . Number of children: 2  . Years of education: 42  . Highest education level: Not on file  Occupational History  . Occupation: Merchandiser, retail: UNEMPLOYED    Employer: DISABLED  Social Needs  . Financial resource  strain: Not on file  . Food insecurity:    Worry: Not on file    Inability: Not on file  . Transportation needs:    Medical: Not on file    Non-medical: Not on file  Tobacco Use  . Smoking status: Current Some Day Smoker    Packs/day: 0.13    Years: 30.00    Pack years: 3.90    Types: Cigarettes  . Smokeless tobacco: Never Used  . Tobacco comment: Restarted smoking.  few cig. daily  Substance and Sexual Activity  . Alcohol use: No    Alcohol/week: 0.0 standard drinks  . Drug use: No  . Sexual activity: Not on file  Lifestyle  . Physical activity:    Days per week: Not on file    Minutes per session: Not on file  . Stress: Not on file  Relationships  . Social connections:    Talks on phone: Not on file    Gets together: Not on file    Attends religious service: Not on file    Active member of club or organization: Not on file    Attends meetings of clubs or organizations: Not on file    Relationship status: Not on file  Other Topics Concern  . Not on file  Social History Narrative   Pt lives in [place] alone.  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   Patient has 11 th grade education.Right handed.   Caffeine- one cup daily           Family History: The patient's family history includes Bladder Cancer in her brother; Breast cancer in her cousin; Cancer in her son; Colon cancer in her brother and mother.  ROS:   Please see the history of present illness.    Positive for shortness of breath, hearing loss, back pain, dizziness, easy bruising, constipation, balance problems, headaches.  All other systems reviewed and are negative.  EKGs/Labs/Other Studies Reviewed:    The following studies were reviewed today: Echocardiogram 10/22/2016: Study Conclusions  - Impressions: Limited echo for EF. Severe LVE. EF 25-30 % Abnormal  septal motion septal, apical and inferior wall hypokinesis   RV normal size with pacing wire seen in cavity.  Impressions:  -  Limited echo for EF. Severe LVE. EF 25-30% Abnormal septal motion   septal, apical and inferior wall hypokinesis   RV normal size with pacing wire seen in cavity.  EKG:  EKG is not ordered today.    Recent Labs: 03/26/2018: ALT 17 03/31/2018: Magnesium 2.0 04/01/2018: Platelets 150 04/15/2018: Hemoglobin 12.9 05/12/2018: BUN 23; Creatinine, Ser 1.68; NT-Pro BNP 699; Potassium 4.4; Sodium 143  Recent Lipid Panel    Component Value Date/Time   CHOL 212 (H) 06/08/2017 1230   TRIG 184 (H) 06/08/2017 1230   HDL 73 06/08/2017 1230   CHOLHDL 2.9 06/08/2017 1230   CHOLHDL 2.7 01/15/2016 1345   VLDL 45 (H) 01/15/2016 1345   LDLCALC 102 (H) 06/08/2017 1230    Physical Exam:    VS:  BP 138/86   Pulse 82   Ht 5\' 2"  (1.575 m)   Wt 172 lb 12.8 oz (78.4 kg)   SpO2 97%   BMI 31.61 kg/m     Wt Readings from Last 3 Encounters:  09/29/18 172 lb 12.8 oz (78.4 kg)  08/05/18 172 lb 12.8 oz (78.4 kg)  07/29/18 171 lb 9.6 oz (77.8 kg)     GEN:  Pleasant elderly woman in no acute distress HEENT: Normal NECK: No JVD; No carotid bruits LYMPHATICS: No lymphadenopathy CARDIAC: RRR, no murmurs, rubs, gallops RESPIRATORY:  Clear but diminished breath sounds bilaterally without rales, wheezing or rhonchi  ABDOMEN: Soft, non-tender, non-distended MUSCULOSKELETAL:  No edema; No deformity  SKIN: Warm and dry NEUROLOGIC:  Alert and oriented x 3 PSYCHIATRIC:  Normal affect   ASSESSMENT:    1. Chronic systolic heart failure ([place])   2. Essential hypertension   3. Coronary artery disease involving native coronary artery of native heart without angina pectoris   4. Mixed hyperlipidemia    PLAN:    In order of problems listed above:  1. Patient with New York Heart Association functional class IIb symptoms at present.  She improved with increase in her diuretic therapy.  She does have stage III chronic kidney disease.  I am going to increase her Entresto dose today and will order follow-up labs in 2  weeks.  She will continue on bisoprolol and furosemide at current doses.  Will arrange follow-up with Richardson Dopp in 3 months. 2. Blood pressure is controlled.  Will increase Entresto.  Could consider Spironolactone but will have to be pretty cautious with her renal function. 3. The patient is having no symptoms of angina.  She continues on aspirin for antiplatelet therapy and a high intensity statin drug with rosuvastatin 20 mg daily. 4. Most recent labs reviewed.  Last cholesterol panel was in 2018 when her LDL was above goal at 102 mg/dL.  We will bring her back for an updated lipid and liver panel.  If she remains above goal will consider intensifying her lipid-lowering program by either doubling Crestor or adding Zetia 10 mg.   Medication Adjustments/Labs and Tests Ordered: Current medicines are reviewed at length with the patient today.  Concerns regarding medicines are outlined above.  Orders Placed This Encounter  Procedures  . Lipid panel  . Comprehensive metabolic panel   Meds ordered this encounter  Medications  . sacubitril-valsartan (ENTRESTO) 49-51 MG    Sig: Take 1 tablet by mouth 2 (two) times daily.    Dispense:  60 tablet  Refill:  11    Patient Instructions  Medication Instructions:  1) INCREASE ENTRESTO to 49-51 mg TWICE DAILY. If you need a refill on your cardiac medications before your next appointment, please call your pharmacy.   Lab work: Your provider recommends that you return for FASTING (no food after midnight) lab work on October 15, 2018. You may come ANY TIME between 7:30AM and 4:30PM. Just remember to check in when you arrive! If you have labs (blood work) drawn today and your tests are completely normal, you will receive your results only by: Marland Kitchen MyChart Message (if you have MyChart) OR . A paper copy in the mail If you have any lab test that is abnormal or we need to change your treatment, we will call you to review the results.  Follow-Up: You have  an appointment scheduled with Richardson Dopp on Tuesday, December 28, 2018 at 2:45PM.     Signed, [name], MD  09/29/2018 5:29 PM    [place]

## 2018-09-29 NOTE — Patient Instructions (Addendum)
Medication Instructions:  1) INCREASE ENTRESTO to 49-51 mg TWICE DAILY. If you need a refill on your cardiac medications before your next appointment, please call your pharmacy.   Lab work: Your provider recommends that you return for FASTING (no food after midnight) lab work on October 15, 2018. You may come ANY TIME between 7:30AM and 4:30PM. Just remember to check in when you arrive! If you have labs (blood work) drawn today and your tests are completely normal, you will receive your results only by: Marland Kitchen MyChart Message (if you have MyChart) OR . A paper copy in the mail If you have any lab test that is abnormal or we need to change your treatment, we will call you to review the results.  Follow-Up: You have an appointment scheduled with Richardson Dopp on Tuesday, December 28, 2018 at 2:45PM.

## 2018-10-15 ENCOUNTER — Other Ambulatory Visit: Payer: Medicare Other | Admitting: *Deleted

## 2018-10-15 ENCOUNTER — Other Ambulatory Visit: Payer: Self-pay

## 2018-10-15 DIAGNOSIS — E782 Mixed hyperlipidemia: Secondary | ICD-10-CM

## 2018-10-15 LAB — COMPREHENSIVE METABOLIC PANEL
A/G RATIO: 1.7 (ref 1.2–2.2)
ALT: 15 IU/L (ref 0–32)
AST: 15 IU/L (ref 0–40)
Albumin: 4.4 g/dL (ref 3.7–4.7)
Alkaline Phosphatase: 120 IU/L — ABNORMAL HIGH (ref 39–117)
BUN/Creatinine Ratio: 15 (ref 12–28)
BUN: 24 mg/dL (ref 8–27)
Bilirubin Total: 0.5 mg/dL (ref 0.0–1.2)
CALCIUM: 9.3 mg/dL (ref 8.7–10.3)
CO2: 18 mmol/L — AB (ref 20–29)
CREATININE: 1.64 mg/dL — AB (ref 0.57–1.00)
Chloride: 110 mmol/L — ABNORMAL HIGH (ref 96–106)
GFR calc non Af Amer: 31 mL/min/{1.73_m2} — ABNORMAL LOW (ref >59)
GFR, EST AFRICAN AMERICAN: 36 mL/min/{1.73_m2} — AB (ref >59)
GLOBULIN, TOTAL: 2.6 g/dL (ref 1.5–4.5)
Glucose: 101 mg/dL — ABNORMAL HIGH (ref 65–99)
POTASSIUM: 4.7 mmol/L (ref 3.5–5.2)
SODIUM: 144 mmol/L (ref 134–144)
TOTAL PROTEIN: 7 g/dL (ref 6.0–8.5)

## 2018-10-15 LAB — LIPID PANEL
CHOLESTEROL TOTAL: 138 mg/dL (ref 100–199)
Chol/HDL Ratio: 2.9 ratio (ref 0.0–4.4)
HDL: 47 mg/dL (ref >39)
LDL Calculated: 59 mg/dL (ref 0–99)
TRIGLYCERIDES: 159 mg/dL — AB (ref 0–149)
VLDL Cholesterol Cal: 32 mg/dL (ref 5–40)

## 2018-10-20 ENCOUNTER — Other Ambulatory Visit: Payer: Self-pay

## 2018-10-20 ENCOUNTER — Encounter: Payer: Medicare Other | Admitting: *Deleted

## 2018-10-20 ENCOUNTER — Ambulatory Visit: Payer: Medicare Other | Admitting: Cardiothoracic Surgery

## 2018-10-20 DIAGNOSIS — Z006 Encounter for examination for normal comparison and control in clinical research program: Secondary | ICD-10-CM

## 2018-10-20 NOTE — Research (Signed)
Pt concerned with coming in for visit today due to COVID-19. Visit done on the phone. Pt doing ok with no complaints. Pt states Entresto was changed on 04/MAR/2020 to 49/51. Will call and schedule next appointment around May.

## 2018-10-25 ENCOUNTER — Encounter: Payer: Medicare Other | Admitting: Family Medicine

## 2018-10-26 ENCOUNTER — Other Ambulatory Visit: Payer: Self-pay | Admitting: Family Medicine

## 2018-10-26 NOTE — Telephone Encounter (Signed)
Requested Prescriptions  Pending Prescriptions Disp Refills  . PROAIR HFA 108 (90 Base) MCG/ACT inhaler [Pharmacy Med Name: PROAIR HFA 90 MCG INHALER 108 (90 BAS AERO] 8.5 g 0    Sig: INHALE 2 PUFFS INTO THE LUNGS EVERY 6 HOURS AS NEEDED. FOR SHORTNESS OF BREATH     Pulmonology:  Beta Agonists Failed - 10/26/2018  9:54 AM      Failed - One inhaler should last at least one month. If the patient is requesting refills earlier, contact the patient to check for uncontrolled symptoms.      Passed - Valid encounter within last 12 months    Recent Outpatient Visits          4 months ago Skin lesion of face   [facility], Lilia Argue, MD   6 months ago Shortness of breath   [facility], Gelene Mink, PA-C   6 months ago Shortness of breath   [facility], Gelene Mink, PA-C   7 months ago Grief at loss of child   [facility], Lilia Argue, MD   10 months ago Grief at loss of child   [facility], Lilia Argue, MD      Future Appointments            In 3 weeks Rutherford Guys, MD [facility]

## 2018-10-28 ENCOUNTER — Other Ambulatory Visit: Payer: Medicare Other

## 2018-11-01 ENCOUNTER — Ambulatory Visit: Payer: Medicare Other | Admitting: Surgery

## 2018-11-08 ENCOUNTER — Ambulatory Visit: Payer: Medicare Other | Admitting: Surgery

## 2018-11-15 ENCOUNTER — Ambulatory Visit (INDEPENDENT_AMBULATORY_CARE_PROVIDER_SITE_OTHER): Payer: Medicare Other | Admitting: *Deleted

## 2018-11-15 ENCOUNTER — Other Ambulatory Visit: Payer: Self-pay

## 2018-11-15 DIAGNOSIS — I255 Ischemic cardiomyopathy: Secondary | ICD-10-CM

## 2018-11-15 LAB — CUP PACEART REMOTE DEVICE CHECK
Battery Remaining Longevity: 41 mo
Battery Remaining Percentage: 35 %
Battery Voltage: 2.9 V
Brady Statistic RV Percent Paced: 1 %
Date Time Interrogation Session: 20200420080417
HighPow Impedance: 84 Ohm
HighPow Impedance: 84 Ohm
Implantable Lead Implant Date: 20130108
Implantable Lead Location: 753860
Implantable Pulse Generator Implant Date: 20130108
Lead Channel Impedance Value: 460 Ohm
Lead Channel Pacing Threshold Amplitude: 0.75 V
Lead Channel Pacing Threshold Pulse Width: 0.5 ms
Lead Channel Sensing Intrinsic Amplitude: 12 mV
Lead Channel Setting Pacing Amplitude: 2.5 V
Lead Channel Setting Pacing Pulse Width: 0.5 ms
Lead Channel Setting Sensing Sensitivity: 0.5 mV
Pulse Gen Serial Number: 1016523

## 2018-11-22 ENCOUNTER — Other Ambulatory Visit: Payer: Self-pay | Admitting: Cardiovascular Disease

## 2018-11-22 ENCOUNTER — Other Ambulatory Visit: Payer: Self-pay

## 2018-11-22 ENCOUNTER — Telehealth (INDEPENDENT_AMBULATORY_CARE_PROVIDER_SITE_OTHER): Payer: Medicare Other | Admitting: Family Medicine

## 2018-11-22 DIAGNOSIS — R300 Dysuria: Secondary | ICD-10-CM | POA: Diagnosis not present

## 2018-11-22 DIAGNOSIS — N952 Postmenopausal atrophic vaginitis: Secondary | ICD-10-CM

## 2018-11-22 MED ORDER — AMITRIPTYLINE HCL 50 MG PO TABS: 50.0000 mg | ORAL_TABLET | Freq: Every day | ORAL | 1 refills | Status: DC

## 2018-11-22 MED ORDER — PROAIR HFA 108 (90 BASE) MCG/ACT IN AERS: INHALATION_SPRAY | RESPIRATORY_TRACT | 4 refills | Status: DC

## 2018-11-22 NOTE — Progress Notes (Signed)
Virtual Visit Note  I connected with patient on 11/22/18 at 129pm by phone and verified that I am speaking with Tammy correct person using two identifiers. [name] is currently located at home and patient is currently with them during visit. Tammy provider, Rutherford Guys, MD is located in their office at time of visit.  I discussed Tammy limitations, risks, security and privacy concerns of performing an evaluation and management service by telephone and Tammy availability of in person appointments. I also discussed with Tammy patient that there may be a patient responsible charge related to this service. Tammy patient expressed understanding and agreed to proceed.   CC: urinary symptoms  HPI ? Patient is a 73 y.o. Roberts with past medical history significant for CAD, CHF, PAD, HTN, HLP, IBS, GERD, migraines, CKD 3, COPD, grief, prediabeteswho presents today for urinary stumptoms  On going issues with dysuria Evaluated by urology in Tammy past - dx with atrophic vaginitis Tried estrogen cream for about 4 days, - burned really bad so she stopped Using OTC azo one a day which helps - urine is now orange Has intermittent difficulty with good stream Denies any worsening of symptoms No fever or chills  Allergies  Allergen Reactions   Lisinopril     cough   Sulfa Antibiotics Other (See Comments)    Unknown, childhood allergy    Sulfonamide Derivatives Other (See Comments)    UNSURE    Prior to Admission medications   Medication Sig Start Date End Date Taking? Authorizing Provider  amitriptyline (ELAVIL) 50 MG tablet Take 1 tablet (50 mg total) by mouth at bedtime. 06/17/18   Rutherford Guys, MD  aspirin 81 MG tablet Take 81 mg by mouth every morning.     [provider]  bisoprolol (ZEBETA) 5 MG tablet TAKE 1 TABLET (5 MG TOTAL) BY MOUTH DAILY. 12/14/17   [name], MD  dexlansoprazole (DEXILANT) 60 MG capsule Take 1 capsule (60 mg total) by mouth daily. 09/27/18    Armbruster, Carlota Raspberry, MD  dicyclomine (BENTYL) 10 MG capsule TAKE 1 CAPSULE BY MOUTH EVERY 8 HOURS AS NEEDED FOR SPASMS. 03/16/18   Armbruster, Carlota Raspberry, MD  famotidine (PEPCID) 20 MG tablet Take 1 tablet (20 mg total) by mouth 2 (two) times daily. 07/29/18   Armbruster, Carlota Raspberry, MD  furosemide (LASIX) 20 MG tablet Take 2 tablets (40 mg total) by mouth daily. 05/04/18   Richardson Dopp T, PA-C  polyethylene glycol powder (GLYCOLAX/MIRALAX) powder Take 17 g by mouth daily as needed for mild constipation. 07/08/17   Rutherford Guys, MD  PREMARIN vaginal cream  05/25/18   [provider]  PROAIR HFA 108 (90 Base) MCG/ACT inhaler INHALE 2 PUFFS INTO Tammy LUNGS EVERY 6 HOURS AS NEEDED. FOR SHORTNESS OF BREATH 10/26/18   Rutherford Guys, MD  rosuvastatin (CRESTOR) 20 MG tablet TAKE 1 TABLET BY MOUTH AT BEDTIME. 08/26/18   [name], MD  sacubitril-valsartan (ENTRESTO) 49-51 MG Take 1 tablet by mouth 2 (two) times daily. 09/29/18 09/24/19  [name], MD  sucralfate (CARAFATE) 1 GM/10ML suspension Take 10 mLs (1 g total) by mouth 4 (four) times daily -  with meals and at bedtime. 07/29/18   Armbruster, Carlota Raspberry, MD  topiramate (TOPAMAX) 50 MG tablet Take 1 tablet (50 mg total) by mouth daily. 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 followed by Dr. Prescott Gum.   Arthritis    Asthma    Benign neoplasm of colon    CAD (coronary artery disease) 02/2009   a. anterior STEMI rx with BMS to prox LAD in 02/2009. b. ISR s/p PTCA 06/2009. c. ISR s/p thrombectomy & PTCA 03/2010 due to late stent thrombosis.    Cardiomyopathy, ischemic 06/12/2011   Chronic renal insufficiency    stage 3     Chronic systolic CHF (congestive heart failure) ([place])    a. s/p ST. Jude ICD 18-Oct-2011.   Chronic systolic heart failure ([place]) 08/05/2011   Colon polyp    Complication of anesthesia    COPD (chronic obstructive pulmonary disease) ([place])    CORONARY ARTERY DISEASE 04/24/2009   Qualifier: Diagnosis of  By: Shane Crutch, Amy S    DDD (degenerative disc disease), lumbar 05/16/2011   Depression    "since my son died in 11/18/2022" 10-17-2017)   Diverticulosis    DIVERTICULOSIS-COLON 08/14/2009   Qualifier: Diagnosis of  By: Trellis Paganini PA-c, Amy S    DYSPHAGIA UNSPECIFIED 04/24/2009   Qualifier: Diagnosis of  By: Laney Potash, Pam     Dyspnea    "when I lay down at night"    Essential hypertension 09/05/2009   Qualifier: Diagnosis of  By: Harvest Dark CMA, Jennifer     Gastric polyp    GERD 10/19/2007   Qualifier: Diagnosis of  By: Rennie Plowman RN, Blanca Friend: Diagnosis of  By: Laney Potash, Pam     GERD (gastroesophageal reflux disease)    GI bleed    a. h/o GIB on DAPT, now on ASA only.   Headache 01/03/2013   Hearing loss    left ear   History of pulmonary embolus (PE) 01/02/2011   Hyperlipemia    Hyperlipidemia, unspecified 04/24/2009   Qualifier: Diagnosis of  By: Trellis Paganini PA-c, Amy S    Hypertension    ICD-St.Jude 08/06/2011   S/p St. Jude ICD placement 08/05/11    Insomnia    Irritable bowel syndrome    Ischemic cardiomyopathy    EF 10-15%   Memory loss    Migraine headache without aura    Myocardial infarct ([place]) 2009-10-17 x 2   Dr. 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)    'When I was young." no residual   Thoracic aortic aneurysm ([place])  03/31/2018   TOBACCO ABUSE 04/04/2009   Qualifier: Diagnosis of  By: Arvid Right  Qualifier: Diagnosis of  By: [name], MD, Jaquelyn Bitter    Transfusion history    ?'12 or '13   UNSPECIFIED ANEMIA 07/03/2010   Qualifier: Diagnosis of  By: Shelda Pal     Unspecified mastoiditis     Past Surgical History:  Procedure Laterality Date   ANGIOPLASTY  07/02/09, 04/01/10   BILATERAL SALPINGOOPHORECTOMY     CAD( bare metal stent)  02/2009   x 1  CAROTID-SUBCLAVIAN BYPASS GRAFT Left 03/31/2018   Procedure: LEFT SUBCLAVIAN ARTERY BYPASS GRAFT;  Surgeon: Serafina Mitchell, MD;  Location[facility];  Service: Vascular;  Laterality: Left;   CERVICAL SPINE SURGERY  08/08   COLONOSCOPY WITH PROPOFOL N/A 04/29/2016   Procedure: COLONOSCOPY WITH PROPOFOL;  Surgeon: [name], MD;  Location: [facility] ENDOSCOPY;  Service: Gastroenterology;  Laterality: N/A;   ESOPHAGOGASTRODUODENOSCOPY N/A 12/09/2016   Procedure: ESOPHAGOGASTRODUODENOSCOPY (EGD);  Surgeon: [name], MD;  Location: [facility];  Service: Gastroenterology;  Laterality: N/A;   ESOPHAGOGASTRODUODENOSCOPY (EGD) WITH PROPOFOL N/A 04/29/2016   Procedure: ESOPHAGOGASTRODUODENOSCOPY (EGD) WITH PROPOFOL;  Surgeon: [name], MD;  Location: [facility] ENDOSCOPY;  Service: Gastroenterology;  Laterality: N/A;   EUS N/A 05/15/2016   Procedure: UPPER ENDOSCOPIC ULTRASOUND (EUS) RADIAL;  Surgeon: Milus Banister, MD;  Location: [facility] ENDOSCOPY;  Service: Endoscopy;  Laterality: N/A;   IMPLANTABLE CARDIOVERTER DEFIBRILLATOR IMPLANT N/A 08/05/2011   Primary prevention SJM ICD implanted,  Analyze ST study patient   INNER EAR SURGERY     left x 17   LUMBAR DISC SURGERY  02/2008   fusion   NASAL SEPTUM SURGERY     THORACIC AORTIC ENDOVASCULAR STENT GRAFT N/A 03/31/2018   Procedure: THORACIC AORTIC ENDOVASCULAR STENT GRAFT;  Surgeon: Serafina Mitchell, MD;  Location: [facility] OR;  Service: Vascular;  Laterality: N/A;    TOTAL ABDOMINAL HYSTERECTOMY     complete    Social History   Tobacco Use   Smoking status: Current Some Day Smoker    Packs/day: 0.13    Years: 30.00    Pack years: 3.90    Types: Cigarettes   Smokeless tobacco: Never Used   Tobacco comment: Restarted smoking.  few cig. daily  Substance Use Topics   Alcohol use: No    Alcohol/week: 0.0 standard drinks    Family History  Problem Relation Age of Onset   Colon cancer Mother    Colon cancer Brother    Bladder Cancer Brother    Breast cancer Cousin    Cancer Son     ROS Per hpi  Objective  Vitals as reported by Tammy patient: none   ASSESSMENT and PLAN  1. Dysuria 2. Vaginal atrophy Chronic, discomfort worse with vag estrogen. Managed with azo prn. Having recurrent decreased stream, ? Stricture, prolapse?   Referring to urogyn for further eval and treatment - Ambulatory referral to Urogynecology  Other orders - amitriptyline (ELAVIL) 50 MG tablet; Take 1 tablet (50 mg total) by mouth at bedtime. - PROAIR HFA 108 (90 Base) MCG/ACT inhaler; INHALE 2 PUFFS INTO Tammy LUNGS EVERY 6 HOURS AS NEEDED. FOR SHORTNESS OF BREATH  FOLLOW-UP: as scheduled   Tammy above assessment and management plan was discussed with Tammy patient. Tammy patient verbalized understanding of and has agreed to Tammy management plan. Patient is aware to call Tammy clinic if symptoms persist or worsen. Patient is aware when to return to Tammy clinic for a follow-up visit. Patient educated on when it is appropriate to go to Tammy emergency department.    I provided 20 minutes of non-face-to-face time during this encounter.  Rutherford Guys, MD Primary Care at [facility] [place], [place] [address] Ph.  [phone] Fax [phone]

## 2018-11-22 NOTE — Progress Notes (Signed)
Pt is needing a refill, having uti symptoms that have been ruled out by another doctor. Taking azo that has discolored the urine. Having pain in the vagina and stomach. Told after testing found nothing,  may be due to aging

## 2018-11-23 ENCOUNTER — Encounter: Payer: Self-pay | Admitting: Cardiology

## 2018-11-23 NOTE — Progress Notes (Signed)
Remote ICD transmission.   

## 2018-11-24 ENCOUNTER — Ambulatory Visit: Payer: Medicare Other | Admitting: Cardiothoracic Surgery

## 2018-11-25 ENCOUNTER — Ambulatory Visit: Payer: Self-pay | Admitting: Family Medicine

## 2018-11-25 NOTE — Telephone Encounter (Signed)
Pt. Reports she fell at home "just a few minutes ago " and injured her right foot. Foot hurts on the bottom at her toes. No swelling or bruising, but painful to put weight on it. No x-ray capability per Corene Cornea in the practice. Pt. Refuses to go to UC at this time. Instructed to elevate her foot and apply an ice pack. Instructed to call back if foot remains painful. Verbalizes understanding. Answer Assessment - Initial Assessment Questions 1. ONSET: "When did the pain start?"      Today 2. LOCATION: "Where is the pain located?"      Hurts on the bottom of foot 3. PAIN: "How bad is the pain?"    (Scale 1-10; or mild, moderate, severe)   -  MILD (1-3): doesn't interfere with normal activities    -  MODERATE (4-7): interferes with normal activities (e.g., work or school) or awakens from sleep, limping    -  SEVERE (8-10): excruciating pain, unable to do any normal activities, unable to walk     10 4. WORK OR EXERCISE: "Has there been any recent work or exercise that involved this part of the body?"      Fell at home 5. CAUSE: "What do you think is causing the foot pain?"     The fall 6. OTHER SYMPTOMS: "Do you have any other symptoms?" (e.g., leg pain, rash, fever, numbness)     No 7. PREGNANCY: "Is there any chance you are pregnant?" "When was your last menstrual period?"     n/a  Protocols used: FOOT PAIN-A-AH

## 2018-11-30 ENCOUNTER — Encounter: Payer: Self-pay | Admitting: Family Medicine

## 2018-11-30 ENCOUNTER — Ambulatory Visit (INDEPENDENT_AMBULATORY_CARE_PROVIDER_SITE_OTHER): Payer: Medicare Other | Admitting: Family Medicine

## 2018-11-30 ENCOUNTER — Ambulatory Visit: Payer: Medicare Other

## 2018-11-30 ENCOUNTER — Ambulatory Visit: Payer: Self-pay

## 2018-11-30 ENCOUNTER — Other Ambulatory Visit: Payer: Self-pay

## 2018-11-30 DIAGNOSIS — M25571 Pain in right ankle and joints of right foot: Secondary | ICD-10-CM | POA: Diagnosis not present

## 2018-11-30 DIAGNOSIS — S92254A Nondisplaced fracture of navicular [scaphoid] of right foot, initial encounter for closed fracture: Secondary | ICD-10-CM

## 2018-11-30 DIAGNOSIS — S92344A Nondisplaced fracture of fourth metatarsal bone, right foot, initial encounter for closed fracture: Secondary | ICD-10-CM | POA: Diagnosis not present

## 2018-11-30 MED ORDER — HYDROCODONE-ACETAMINOPHEN 5-325 MG PO TABS: 1.0000 | ORAL_TABLET | Freq: Four times a day (QID) | ORAL | 0 refills | Status: DC | PRN

## 2018-11-30 NOTE — Patient Instructions (Signed)
   For bone healing:  - Vitamin D3:  5,000 IU daily  - Magnesium:  400 mg daily  - Vitamin K2:  100 mcg daily  Diagnosis:  Fracture (broken bone) of navicular bone and 4th metatarsal bone.

## 2018-11-30 NOTE — Progress Notes (Signed)
Office Visit Note   Patient: [name]           Date of Birth: [birth year]           MRN: [id number] Visit Date: 11/30/2018 Requested by: Rutherford Guys, MD [address] [place], [place] [address] PCP: Rutherford Guys, MD  Subjective: Chief Complaint  Patient presents with  . Right Foot - Pain    DOI 11/26/2018 fell at home. Pain in top & bottom of foot and into anterior ankle. Bruised area bottom of foot. Swelling.    HPI: She is here with right foot and ankle pain.  Symptoms started on May 1, she tripped and fell at home.  Did not hurt anything else, but had immediate pain in her foot and ankle.  Able to bear weight but with a significant limp.  Pain is at the ball of her foot, across the top of her foot and in the front of her ankle.  Tylenol is not helping.  No previous problems with this foot but she has a history of multiple other fractures.  She has had a bone density test in the past and does not believe she has osteoporosis.  Her problem list states osteoporosis.              ROS: She has cardiac troubles, renal insufficiency, prediabetes and other medical conditions.  All other systems were reviewed and are negative.  Objective: Vital Signs: There were no vitals taken for this visit.  Physical Exam:  General:  Alert and oriented, in no acute distress. Pulm:  Breathing unlabored. Psy:  Normal mood, congruent affect. Skin: No rash or bruising visible. Right ankle and foot: No tenderness to palpation of the malleoli.  She is moderately tender over the anterior ankle joint.  2+ dorsalis pedis pulse.  Diffuse tenderness to palpation of the metatarsals near the MTP joints.  Did not test ankle ligament stability due to pain.  Imaging: X-rays right ankle: On the lateral view it appears that she has a nondisplaced avulsion fracture of the navicular.  Ankle mortise is intact, no other fracture seen.  X-rays right foot: I believe she has a nondisplaced fracture of the  proximal fourth metatarsal.  Question whether she might have a fracture line through the great toe proximal phalanx on the lateral view, but clinically she is not tender there.    Assessment & Plan: 1.  4 days status post fall with right foot navicular avulsion fracture, and proximal fourth metatarsal fracture both nondisplaced. -Fracture boot, weightbearing as tolerated. -Norco sparingly as needed for pain. -Vitamin D3, magnesium and vitamin K 2 for bone healing. -Return in about 3 weeks for repeat three-view foot x-rays.     Procedures: No procedures performed  No notes on file     PMFS History: Patient Active Problem List   Diagnosis Date Noted  . Grief at loss of child 08/05/2018  . Colon polyp 06/16/2018  . Thoracic aortic aneurysm ([place]) 03/31/2018  . Chronic renal insufficiency   . AICD (automatic cardioverter/defibrillator) present   . Chronic systolic CHF (congestive heart failure) ([place])   . COPD (chronic obstructive pulmonary disease) ([place])   . Hyperlipemia   . Insomnia   . Migraine headache without aura   . Pre-diabetes 06/19/2017  . Abnormal LFTs 06/19/2017  . Abnormal CT scan, stomach   . Gastric polyp   . Osteoporosis 05/11/2013  . Hearing loss 04/29/2013  . Mild cognitive impairment 04/29/2013  . Headache 01/03/2013  .  Tremor 08/13/2012  . Pseudoaneurysm of aortic arch ([place]) 05/05/2012  . ICD-St.Jude 08/06/2011  . Chronic systolic heart failure ([place]) 08/05/2011  . Cardiomyopathy, ischemic 06/12/2011  . DDD (degenerative disc disease), lumbar 05/16/2011  . History of pulmonary embolus (PE) 01/02/2011  . UNSPECIFIED ANEMIA 07/03/2010  . Essential hypertension 09/05/2009  . DIVERTICULOSIS-COLON 08/14/2009  . ABDOMINAL PAIN, LEFT LOWER QUADRANT 08/14/2009  . Hyperlipidemia, unspecified 04/24/2009  . CORONARY ARTERY DISEASE 04/24/2009  . DYSPHAGIA UNSPECIFIED 04/24/2009  . PERSONAL HX COLONIC POLYPS 04/24/2009  . TOBACCO ABUSE 04/04/2009  . CAD  (coronary artery disease) 02/25/2009  . MASTOIDITIS 10/19/2007  . GERD 10/19/2007  . IBS 10/19/2007  . COLONIC POLYPS, ADENOMATOUS 05/12/2007   Past Medical History:  Diagnosis Date  . Abdominal pain, left lower quadrant 08/14/2009   Qualifier: Diagnosis of  By: Laney Potash, Pam    . Abnormal CT scan, stomach   . Abnormal LFTs 06/19/2017  . AICD (automatic cardioverter/defibrillator) present    Dr.Allred follows  . Aortic arch pseudoaneurysm ([facility])    a. followed by Dr. Prescott Gum.  . Arthritis   . Asthma   . Benign neoplasm of colon   . CAD (coronary artery disease) 02/2009   a. anterior STEMI rx with BMS to prox LAD in 02/2009. b. ISR s/p PTCA 06/2009. c. ISR s/p thrombectomy & PTCA 03/2010 due to late stent thrombosis.   . Cardiomyopathy, ischemic 06/12/2011  . Chronic renal insufficiency    stage 3  . Chronic systolic CHF (congestive heart failure) ([place])    a. s/p ST. Jude ICD Nov 12, 2011.  Marland Kitchen Chronic systolic heart failure ([place]) 08/05/2011  . Colon polyp   . Complication of anesthesia   . COPD (chronic obstructive pulmonary disease) ([place])   . CORONARY ARTERY DISEASE 04/24/2009   Qualifier: Diagnosis of  By: Trellis Paganini PA-c, Amy S   . DDD (degenerative disc disease), lumbar 05/16/2011  . Depression    "since my son died in 12/13/22" 11-Nov-2017)  . Diverticulosis   . DIVERTICULOSIS-COLON 08/14/2009   Qualifier: Diagnosis of  By: Trellis Paganini PA-c, Amy S   . DYSPHAGIA UNSPECIFIED 04/24/2009   Qualifier: Diagnosis of  By: Laney Potash, Pam    . Dyspnea    "when I lay down at night"   . Essential hypertension 09/05/2009   Qualifier: Diagnosis of  By: Harvest Dark CMA, Anderson Malta    . Gastric polyp   . GERD 10/19/2007   Qualifier: Diagnosis of  By: Rennie Plowman RN, Blanca Friend: Diagnosis of  By: Laney Potash, Pam    . GERD (gastroesophageal reflux disease)   . GI bleed    a. h/o GIB on DAPT, now on ASA only.  Marland Kitchen Headache 01/03/2013  . Hearing loss    left ear  . History of pulmonary embolus ([place])  01/02/2011  . Hyperlipemia   . Hyperlipidemia, unspecified 04/24/2009   Qualifier: Diagnosis of  By: Trellis Paganini PA-c, Amy S   . Hypertension   . ICD-St.Jude 08/06/2011   S/p St. Jude ICD placement 08/05/11   . Insomnia   . Irritable bowel syndrome   . Ischemic cardiomyopathy    EF 10-15%  . Memory loss   . Migraine headache without aura   . Myocardial infarct [facility]) 11-11-09 x 2   Dr. Roswell Nickel   . PERSONAL HX COLONIC POLYPS 04/24/2009   Qualifier: Diagnosis of  By: Shane Crutch, Amy S November, 2011 colonoscopy demonstrated a sessile cecal polyp and sigmoid polyp   . Pneumonia   . PONV (  postoperative nausea and vomiting)   . Pre-diabetes   . 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 Pulmonary embolism ([place]) 2011  . Stroke [facility])    'When I was young." no residual  . Thoracic aortic aneurysm ([place]) 03/31/2018  . TOBACCO ABUSE 04/04/2009   Qualifier: Diagnosis of  By: Arvid Right  Qualifier: Diagnosis of  By: [name], MD, Jaquelyn Bitter   . Transfusion history    ?'12 or '13  . UNSPECIFIED ANEMIA 07/03/2010   Qualifier: Diagnosis of  By: Shelda Pal    . Unspecified mastoiditis     Family History  Problem Relation Age of Onset  . Colon cancer Mother   . Colon cancer Brother   . Bladder Cancer Brother   . Breast cancer Cousin   . Cancer Son     Past Surgical History:  Procedure Laterality Date  . ANGIOPLASTY  07/02/09, 04/01/10  . BILATERAL SALPINGOOPHORECTOMY    . CAD( bare metal stent)  02/2009   x 1  . CAROTID-SUBCLAVIAN BYPASS GRAFT Left 03/31/2018   Procedure: LEFT SUBCLAVIAN ARTERY BYPASS GRAFT;  Surgeon: Serafina Mitchell, MD;  Location: [place];  Service: Vascular;  Laterality: Left;  . CERVICAL SPINE SURGERY  08/08  . COLONOSCOPY WITH PROPOFOL N/A 04/29/2016   Procedure: COLONOSCOPY WITH PROPOFOL;  Surgeon: [name], MD;  Location: [facility];  Service: Gastroenterology;  Laterality: N/A;  . ESOPHAGOGASTRODUODENOSCOPY N/A 12/09/2016    Procedure: ESOPHAGOGASTRODUODENOSCOPY (EGD);  Surgeon: [name], MD;  Location: [facility];  Service: Gastroenterology;  Laterality: N/A;  . ESOPHAGOGASTRODUODENOSCOPY (EGD) WITH PROPOFOL N/A 04/29/2016   Procedure: ESOPHAGOGASTRODUODENOSCOPY (EGD) WITH PROPOFOL;  Surgeon: [name], MD;  Location: [facility];  Service: Gastroenterology;  Laterality: N/A;  . EUS N/A 05/15/2016   Procedure: UPPER ENDOSCOPIC ULTRASOUND (EUS) RADIAL;  Surgeon: Milus Banister, MD;  Location: [facility];  Service: Endoscopy;  Laterality: N/A;  . IMPLANTABLE CARDIOVERTER DEFIBRILLATOR IMPLANT N/A 08/05/2011   Primary prevention SJM ICD implanted,  Analyze ST study patient  . INNER EAR SURGERY     left x 17  . LUMBAR DISC SURGERY  02/2008   fusion  . NASAL SEPTUM SURGERY    . THORACIC AORTIC ENDOVASCULAR STENT GRAFT N/A 03/31/2018   Procedure: THORACIC AORTIC ENDOVASCULAR STENT GRAFT;  Surgeon: Serafina Mitchell, MD;  Location: [facility] OR;  Service: Vascular;  Laterality: N/A;  . TOTAL ABDOMINAL HYSTERECTOMY     complete   Social History   Occupational History  . Occupation: Merchandiser, retail: UNEMPLOYED    Employer: DISABLED  Tobacco Use  . Smoking status: Current Some Day Smoker    Packs/day: 0.13    Years: 30.00    Pack years: 3.90    Types: Cigarettes  . Smokeless tobacco: Never Used  . Tobacco comment: Restarted smoking.  few cig. daily  Substance and Sexual Activity  . Alcohol use: No    Alcohol/week: 0.0 standard drinks  . Drug use: No  . Sexual activity: Not on file

## 2018-12-21 ENCOUNTER — Ambulatory Visit (INDEPENDENT_AMBULATORY_CARE_PROVIDER_SITE_OTHER): Payer: Medicare Other

## 2018-12-21 ENCOUNTER — Ambulatory Visit (INDEPENDENT_AMBULATORY_CARE_PROVIDER_SITE_OTHER): Payer: Medicare Other | Admitting: Family Medicine

## 2018-12-21 ENCOUNTER — Encounter: Payer: Self-pay | Admitting: Family Medicine

## 2018-12-21 ENCOUNTER — Other Ambulatory Visit: Payer: Self-pay

## 2018-12-21 DIAGNOSIS — S92344D Nondisplaced fracture of fourth metatarsal bone, right foot, subsequent encounter for fracture with routine healing: Secondary | ICD-10-CM

## 2018-12-21 DIAGNOSIS — M25571 Pain in right ankle and joints of right foot: Secondary | ICD-10-CM

## 2018-12-21 NOTE — Progress Notes (Signed)
Office Visit Note   Patient: [name]           Date of Birth: 06-30-1946           MRN: [id number] Visit Date: 12/21/2018 Requested by: Rutherford Guys, MD [address] [place], [place] [address] PCP: Rutherford Guys, MD  Subjective: Chief Complaint  Patient presents with  . Right Foot - Follow-up    DOI 11/26/2018. Been wearing fracture boot, FWB. Boot actually hurts the lateral foot/ankle area ("it's so heavy."). Been icing as needed.    HPI: She is about 3 weeks status post fall resulting in right foot proximal fourth metatarsal and navicular fracture.  Doing well, full weightbearing in her fracture boot.  The boot seems to be rubbing against her ankle.  Her foot still hurts on the lateral aspect, but ice helps quite a bit.              ROS: No fevers or chills or respiratory symptoms.  All other systems were reviewed and are negative.  Objective: Vital Signs: There were no vitals taken for this visit.  Physical Exam:  General:  Alert and oriented, in no acute distress. Pulm:  Breathing unlabored. Psy:  Normal mood, congruent affect. Skin: No skin breakdown. Right foot: Slightly tender in the soft tissues anterior ankle.  Slightly tender at the navicular on the dorsal aspect.  Moderately tender at the proximal fourth and third metatarsals, and mildly at the proximal fifth.  Imaging: X-rays right foot: The navicular fracture is not readily visible.  It appears that she has healing nondisplaced fractures of the proximal third, fourth, and fifth metatarsals.  Assessment & Plan: 1.  Clinically healing 3 weeks status post fall with right foot proximal third, fourth and fifth metatarsal fractures -Switch to a postop shoe, weightbearing as tolerated.  Follow-up in about 3 weeks for repeat two-view foot x-ray.     Procedures: No procedures performed  No notes on file     PMFS History: Patient Active Problem List   Diagnosis Date Noted  . Grief at loss of child  08/05/2018  . Colon polyp 06/16/2018  . Thoracic aortic aneurysm ([place]) 03/31/2018  . Chronic renal insufficiency   . AICD (automatic cardioverter/defibrillator) present   . Chronic systolic CHF (congestive heart failure) ([place])   . COPD (chronic obstructive pulmonary disease) ([place])   . Hyperlipemia   . Insomnia   . Migraine headache without aura   . Pre-diabetes 06/19/2017  . Abnormal LFTs 06/19/2017  . Abnormal CT scan, stomach   . Gastric polyp   . Osteoporosis 05/11/2013  . Hearing loss 04/29/2013  . Mild cognitive impairment 04/29/2013  . Headache 01/03/2013  . Tremor 08/13/2012  . 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 ICD-St.Jude 08/06/2011  . Chronic systolic heart failure ([place]) 08/05/2011  . Cardiomyopathy, ischemic 06/12/2011  . DDD (degenerative disc disease), lumbar 05/16/2011  . History of pulmonary embolus (PE) 01/02/2011  . UNSPECIFIED ANEMIA 07/03/2010  . Essential hypertension 09/05/2009  . DIVERTICULOSIS-COLON 08/14/2009  . ABDOMINAL PAIN, LEFT LOWER QUADRANT 08/14/2009  . Hyperlipidemia, unspecified 04/24/2009  . CORONARY ARTERY DISEASE 04/24/2009  . DYSPHAGIA UNSPECIFIED 04/24/2009  . PERSONAL HX COLONIC POLYPS 04/24/2009  . TOBACCO ABUSE 04/04/2009  . CAD (coronary artery disease) 02/25/2009  . MASTOIDITIS 10/19/2007  . GERD 10/19/2007  . IBS 10/19/2007  . COLONIC POLYPS, ADENOMATOUS 05/12/2007   Past Medical History:  Diagnosis Date  . Abdominal pain, left lower quadrant 08/14/2009  Qualifier: Diagnosis of  By: Laney Potash, Pam    . Abnormal CT scan, stomach   . Abnormal LFTs 06/19/2017  . AICD (automatic cardioverter/defibrillator) present    Dr.Allred follows  . Aortic arch pseudoaneurysm (HCC)    a. followed by Dr. Prescott Gum.  . Arthritis   . Asthma   . Benign neoplasm of colon   . CAD (coronary artery disease) 02/2009   a. anterior STEMI rx with BMS to prox LAD in 02/2009. b. ISR s/p PTCA 06/2009. c. ISR s/p thrombectomy & PTCA  03/2010 due to late stent thrombosis.   . Cardiomyopathy, ischemic 06/12/2011  . Chronic renal insufficiency    stage 3  . Chronic systolic CHF (congestive heart failure) ([place])    a. s/p ST. Jude ICD 11-16-11.  Marland Kitchen Chronic systolic heart failure ([place]) 08/05/2011  . Colon polyp   . Complication of anesthesia   . COPD (chronic obstructive pulmonary disease) ([place])   . CORONARY ARTERY DISEASE 04/24/2009   Qualifier: Diagnosis of  By: Trellis Paganini PA-c, Amy S   . DDD (degenerative disc disease), lumbar 05/16/2011  . Depression    "since my son died in 12/17/22" Nov 15, 2017)  . Diverticulosis   . DIVERTICULOSIS-COLON 08/14/2009   Qualifier: Diagnosis of  By: Trellis Paganini PA-c, Amy S   . DYSPHAGIA UNSPECIFIED 04/24/2009   Qualifier: Diagnosis of  By: Laney Potash, Pam    . Dyspnea    "when I lay down at night"   . Essential hypertension 09/05/2009   Qualifier: Diagnosis of  By: Harvest Dark CMA, Anderson Malta    . Gastric polyp   . GERD 10/19/2007   Qualifier: Diagnosis of  By: Rennie Plowman RN, Blanca Friend: Diagnosis of  By: Laney Potash, Pam    . GERD (gastroesophageal reflux disease)   . GI bleed    a. h/o GIB on DAPT, now on ASA only.  Marland Kitchen Headache 01/03/2013  . Hearing loss    left ear  . History of pulmonary embolus (PE) 01/02/2011  . Hyperlipemia   . Hyperlipidemia, unspecified 04/24/2009   Qualifier: Diagnosis of  By: Trellis Paganini PA-c, Amy S   . Hypertension   . ICD-St.Jude 08/06/2011   S/p St. Jude ICD placement 08/05/11   . Insomnia   . Irritable bowel syndrome   . Ischemic cardiomyopathy    EF 10-15%  . Memory loss   . Migraine headache without aura   . Myocardial infarct [facility]) November 15, 2009 x 2   Dr. Roswell Nickel   . PERSONAL HX COLONIC POLYPS 04/24/2009   Qualifier: Diagnosis of  By: Shane Crutch, Amy S November, 2011 colonoscopy demonstrated a sessile cecal polyp and sigmoid polyp   . Pneumonia   . PONV (postoperative nausea and vomiting)   . Pre-diabetes   . 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  Pulmonary embolism ([place]) 2009/11/15  . Stroke [facility])    'When I was young." no residual  . Thoracic aortic aneurysm ([place]) 03/31/2018  . TOBACCO ABUSE 04/04/2009   Qualifier: Diagnosis of  By: Arvid Right  Qualifier: Diagnosis of  By: [name], MD, Jaquelyn Bitter   . Transfusion history    ?'12 or '13  . UNSPECIFIED ANEMIA 07/03/2010   Qualifier: Diagnosis of  By: Shelda Pal    . Unspecified mastoiditis     Family History  Problem Relation Age of Onset  . Colon cancer Mother   . Colon cancer Brother   . Bladder Cancer Brother   . Breast cancer Cousin   .  Cancer Son     Past Surgical History:  Procedure Laterality Date  . ANGIOPLASTY  07/02/09, 04/01/10  . BILATERAL SALPINGOOPHORECTOMY    . CAD( bare metal stent)  02/2009   x 1  . CAROTID-SUBCLAVIAN BYPASS GRAFT Left 03/31/2018   Procedure: LEFT SUBCLAVIAN ARTERY BYPASS GRAFT;  Surgeon: Serafina Mitchell, MD;  Location: [facility];  Service: Vascular;  Laterality: Left;  . CERVICAL SPINE SURGERY  08/08  . COLONOSCOPY WITH PROPOFOL N/A 04/29/2016   Procedure: COLONOSCOPY WITH PROPOFOL;  Surgeon: [name], MD;  Location: [facility];  Service: Gastroenterology;  Laterality: N/A;  . ESOPHAGOGASTRODUODENOSCOPY N/A 12/09/2016   Procedure: ESOPHAGOGASTRODUODENOSCOPY (EGD);  Surgeon: [name], MD;  Location: [facility];  Service: Gastroenterology;  Laterality: N/A;  . ESOPHAGOGASTRODUODENOSCOPY (EGD) WITH PROPOFOL N/A 04/29/2016   Procedure: ESOPHAGOGASTRODUODENOSCOPY (EGD) WITH PROPOFOL;  Surgeon: [name], MD;  Location: [facility];  Service: Gastroenterology;  Laterality: N/A;  . EUS N/A 05/15/2016   Procedure: UPPER ENDOSCOPIC ULTRASOUND (EUS) RADIAL;  Surgeon: Milus Banister, MD;  Location: [facility];  Service: Endoscopy;  Laterality: N/A;  . IMPLANTABLE CARDIOVERTER DEFIBRILLATOR IMPLANT N/A 08/05/2011   Primary prevention SJM ICD implanted,  Analyze ST study patient  . INNER EAR  SURGERY     left x 17  . LUMBAR DISC SURGERY  02/2008   fusion  . NASAL SEPTUM SURGERY    . THORACIC AORTIC ENDOVASCULAR STENT GRAFT N/A 03/31/2018   Procedure: THORACIC AORTIC ENDOVASCULAR STENT GRAFT;  Surgeon: Serafina Mitchell, MD;  Location: [facility] OR;  Service: Vascular;  Laterality: N/A;  . TOTAL ABDOMINAL HYSTERECTOMY     complete   Social History   Occupational History  . Occupation: Merchandiser, retail: UNEMPLOYED    Employer: DISABLED  Tobacco Use  . Smoking status: Current Some Day Smoker    Packs/day: 0.13    Years: 30.00    Pack years: 3.90    Types: Cigarettes  . Smokeless tobacco: Never Used  . Tobacco comment: Restarted smoking.  few cig. daily  Substance and Sexual Activity  . Alcohol use: No    Alcohol/week: 0.0 standard drinks  . Drug use: No  . Sexual activity: Not on file

## 2018-12-23 ENCOUNTER — Telehealth: Payer: Self-pay | Admitting: *Deleted

## 2018-12-23 NOTE — Telephone Encounter (Signed)
PT CONSENTED TO TELEPHONE CALL   Confirm consent - "In the setting of the current Covid19 crisis, you are scheduled for a (phone or video) visit with your provider on (date) at (time).  Just as we do with many in-office visits, in order for you to participate in this visit, we must obtain consent.  If you'd like, I can send this to your mychart (if signed up) or email for you to review.  Otherwise, I can obtain your verbal consent now.  All virtual visits are billed to your insurance company just like a normal visit would be.  By agreeing to a virtual visit, we'd like you to understand that the technology does not allow for your provider to perform an examination, and thus may limit your provider's ability to fully assess your condition. If your provider identifies any concerns that need to be evaluated in person, we will make arrangements to do so.  Finally, though the technology is pretty good, we cannot assure that it will always work on either your or our end, and in the setting of a video visit, we may have to convert it to a phone-only visit.  In either situation, we cannot ensure that we have a secure connection.  Are you willing to proceed?" STAFF: Did the patient verbally acknowledge consent to telehealth visit? Document YES/NO here: YES   TELEPHONE CALL NOTE  [name] has been deemed a candidate for a follow-up tele-health visit to limit community exposure during the Covid-19 pandemic. I spoke with the patient via phone to ensure availability of phone/video source, confirm preferred email & phone number, and discuss instructions and expectations.  I reminded [name] to be prepared with any vital sign and/or heart rhythm information that could potentially be obtained via home monitoring, at the time of her visit. I reminded [name] to expect a phone call prior to her visit.  Claude Manges, [place] 12/23/2018 10:41 AM   FULL LENGTH CONSENT FOR TELE-HEALTH VISIT   I  hereby voluntarily request, consent and authorize [facility] and its employed or contracted physicians, physician assistants, nurse practitioners or other licensed health care professionals (the Practitioner), to provide me with telemedicine health care services (the "Services") as deemed necessary by the treating Practitioner. I acknowledge and consent to receive the Services by the Practitioner via telemedicine. I understand that the telemedicine visit will involve communicating with the Practitioner through live audiovisual communication technology and the disclosure of certain medical information by electronic transmission. I acknowledge that I have been given the opportunity to request an in-person assessment or other available alternative prior to the telemedicine visit and am voluntarily participating in the telemedicine visit.  I understand that I have the right to withhold or withdraw my consent to the use of telemedicine in the course of my care at any time, without affecting my right to future care or treatment, and that the Practitioner or I may terminate the telemedicine visit at any time. I understand that I have the right to inspect all information obtained and/or recorded in the course of the telemedicine visit and may receive copies of available information for a reasonable fee.  I understand that some of the potential risks of receiving the Services via telemedicine include:  Marland Kitchen Delay or interruption in medical evaluation due to technological equipment failure or disruption; . Information transmitted may not be sufficient (e.g. poor resolution of images) to allow for appropriate medical decision making by the Practitioner; and/or  .  In rare instances, security protocols could fail, causing a breach of personal health information.  Furthermore, I acknowledge that it is my responsibility to provide information about my medical history, conditions and care that is complete and accurate to  the best of my ability. I acknowledge that Practitioner's advice, recommendations, and/or decision may be based on factors not within their control, such as incomplete or inaccurate data provided by me or distortions of diagnostic images or specimens that may result from electronic transmissions. I understand that the practice of medicine is not an exact science and that Practitioner makes no warranties or guarantees regarding treatment outcomes. I acknowledge that I will receive a copy of this consent concurrently upon execution via email to the email address I last provided but may also request a printed copy by calling the office of [facility].    I understand that my insurance will be billed for this visit.   I have read or had this consent read to me. . I understand the contents of this consent, which adequately explains the benefits and risks of the Services being provided via telemedicine.  . I have been provided ample opportunity to ask questions regarding this consent and the Services and have had my questions answered to my satisfaction. . I give my informed consent for the services to be provided through the use of telemedicine in my medical care  By participating in this telemedicine visit I agree to the above.

## 2018-12-25 ENCOUNTER — Telehealth: Payer: Self-pay | Admitting: Cardiology

## 2018-12-25 DIAGNOSIS — Z20822 Contact with and (suspected) exposure to covid-19: Secondary | ICD-10-CM

## 2018-12-25 NOTE — Telephone Encounter (Signed)
Spoke with patient regarding recent office visit 5/26, potential Covid exposure. Covid order placed 5/31 at 2:00pm at [facility]

## 2018-12-26 ENCOUNTER — Other Ambulatory Visit: Payer: Medicare Other

## 2018-12-26 DIAGNOSIS — Z20822 Contact with and (suspected) exposure to covid-19: Secondary | ICD-10-CM

## 2018-12-27 LAB — NOVEL CORONAVIRUS, NAA: SARS-CoV-2, NAA: NOT DETECTED

## 2018-12-27 NOTE — Progress Notes (Signed)
Virtual Visit via Telephone Note   This visit type was conducted due to national recommendations for restrictions regarding the COVID-19 Pandemic (e.g. social distancing) in an effort to limit this patient's exposure and mitigate transmission in our community.  Due to her co-morbid illnesses, this patient is at least at moderate risk for complications without adequate follow up.  This format is felt to be most appropriate for this patient at this time.  The patient did not have access to video technology/had technical difficulties with video requiring transitioning to audio format only (telephone).  All issues noted in this document were discussed and addressed.  No physical exam could be performed with this format.  Please refer to the patient's chart for her  consent to telehealth for [facility].   Date:  12/28/2018   ID:  [name], DOB 09/06/1945, MRN [id number]  Patient Location: Home Provider Location: Home  PCP:  Rutherford Guys, MD  Cardiologist:  [name], MD   Electrophysiologist:  Thompson Grayer, MD   Evaluation Performed:  Follow-Up Visit  Chief Complaint: Follow-up on CHF, CAD  History of Present Illness:    [name] is a 73 y.o. female with coronary artery disease status post anterior myocardial infarction in 2010 treated with bare-metal stent to the proximal LAD and multiple percutaneous coronary interventions since that time due to in-stent restenosis, systolic heart failure secondary to ischemic cardiomyopathy, status post ICD, prior pulmonary embolism, COPD, chronic kidney disease, prior GI bleeding, hypertension, hyperlipidemia and thoracic aortic aneurysm.  She underwent endovascular repair of her thoracic aortic aneurysm which was preceded by left carotid subclavian transposition in September 2019.  She was last seen by Dr. Burt Knack in 09/2018.  She fell and broke her foot recently.  She was at the orthopedic clinic last week and was exposed to  SARS-CoV-2.  Her test is currently pending.  She has not had any fever cough.  She is quite nervous about this.  She has not been able to check her blood pressure.  She thinks her batteries are dead but she cannot get out to get batteries because she is under quarantine.  She has not had any chest pain.  She has chronic shortness of breath without significant change.  She has been eating more and her weight has gradually gone up.  She has not had any syncope, PND or lower extremity swelling.  She has not had any bleeding issues.  The patient does not have symptoms concerning for COVID-19 infection (fever, chills, cough, or new shortness of breath).    Past Medical History:  Diagnosis Date  . Abdominal pain, left lower quadrant 08/14/2009   Qualifier: Diagnosis of  By: Laney Potash, Pam    . Abnormal CT scan, stomach   . Abnormal LFTs 06/19/2017  . AICD (automatic cardioverter/defibrillator) present    Dr.Allred follows  . Aortic arch pseudoaneurysm (HCC)    a. followed by Dr. Prescott Gum.  . Arthritis   . Asthma   . Benign neoplasm of colon   . CAD (coronary artery disease) 02/2009   a. anterior STEMI rx with BMS to prox LAD in 02/2009. b. ISR s/p PTCA 06/2009. c. ISR s/p thrombectomy & PTCA 03/2010 due to late stent thrombosis.   . Cardiomyopathy, ischemic 06/12/2011  . Chronic renal insufficiency    stage 3  . Chronic systolic CHF (congestive heart failure) ([place])    a. s/p ST. Jude ICD 2013.  Marland Kitchen Chronic systolic heart failure ([place]) 08/05/2011  .  Colon polyp   . Complication of anesthesia   . COPD (chronic obstructive pulmonary disease) ([place])   . CORONARY ARTERY DISEASE 04/24/2009   Qualifier: Diagnosis of  By: Trellis Paganini PA-c, Amy S   . DDD (degenerative disc disease), lumbar 05/16/2011  . Depression    "since my son died in Nov 27, 2022" 2017/10/26)  . Diverticulosis   . DIVERTICULOSIS-COLON 08/14/2009   Qualifier: Diagnosis of  By: Trellis Paganini PA-c, Amy S   . DYSPHAGIA UNSPECIFIED 04/24/2009    Qualifier: Diagnosis of  By: Laney Potash, Pam    . Dyspnea    "when I lay down at night"   . Essential hypertension 09/05/2009   Qualifier: Diagnosis of  By: Harvest Dark CMA, Anderson Malta    . Gastric polyp   . GERD 10/19/2007   Qualifier: Diagnosis of  By: Rennie Plowman RN, Blanca Friend: Diagnosis of  By: Laney Potash, Pam    . GERD (gastroesophageal reflux disease)   . GI bleed    a. h/o GIB on DAPT, now on ASA only.  Marland Kitchen Headache 01/03/2013  . Hearing loss    left ear  . History of pulmonary embolus (PE) 01/02/2011  . Hyperlipemia   . Hyperlipidemia, unspecified 04/24/2009   Qualifier: Diagnosis of  By: Trellis Paganini PA-c, Amy S   . Hypertension   . ICD-St.Jude 08/06/2011   S/p St. Jude ICD placement 08/05/11   . Insomnia   . Irritable bowel syndrome   . Ischemic cardiomyopathy    EF 10-15%  . Memory loss   . Migraine headache without aura   . Myocardial infarct [facility]) 10/26/09 x 2   Dr. Roswell Nickel   . PERSONAL HX COLONIC POLYPS 04/24/2009   Qualifier: Diagnosis of  By: Shane Crutch, Amy S November, 2011 colonoscopy demonstrated a sessile cecal polyp and sigmoid polyp   . Pneumonia   . PONV (postoperative nausea and vomiting)   . Pre-diabetes   . Pseudoaneurysm of aortic arch ([employer]) 05/05/2012  . Pulmonary embolism ([place]) 26-Oct-2009  . Stroke [facility])    'When I was young." no residual  . Thoracic aortic aneurysm ([place]) 03/31/2018  . TOBACCO ABUSE 04/04/2009   Qualifier: Diagnosis of  By: Arvid Right  Qualifier: Diagnosis of  By: [name], MD, Jaquelyn Bitter   . Transfusion history    ?'12 or '13  . UNSPECIFIED ANEMIA 07/03/2010   Qualifier: Diagnosis of  By: Shelda Pal    . Unspecified mastoiditis    Past Surgical History:  Procedure Laterality Date  . ANGIOPLASTY  07/02/09, 04/01/10  . BILATERAL SALPINGOOPHORECTOMY    . CAD( bare metal stent)  02/2009   x 1  . CAROTID-SUBCLAVIAN BYPASS GRAFT Left 03/31/2018   Procedure: LEFT SUBCLAVIAN ARTERY BYPASS GRAFT;  Surgeon: Serafina Mitchell, MD;  Location: [facility];  Service: Vascular;  Laterality: Left;  . CERVICAL SPINE SURGERY  08/08  . COLONOSCOPY WITH PROPOFOL N/A 04/29/2016   Procedure: COLONOSCOPY WITH PROPOFOL;  Surgeon: [name], MD;  Location: [facility];  Service: Gastroenterology;  Laterality: N/A;  . ESOPHAGOGASTRODUODENOSCOPY N/A 12/09/2016   Procedure: ESOPHAGOGASTRODUODENOSCOPY (EGD);  Surgeon: [name], MD;  Location: [facility];  Service: Gastroenterology;  Laterality: N/A;  . ESOPHAGOGASTRODUODENOSCOPY (EGD) WITH PROPOFOL N/A 04/29/2016   Procedure: ESOPHAGOGASTRODUODENOSCOPY (EGD) WITH PROPOFOL;  Surgeon: [name], MD;  Location: [facility];  Service: Gastroenterology;  Laterality: N/A;  . EUS N/A 05/15/2016   Procedure: UPPER ENDOSCOPIC ULTRASOUND (EUS) RADIAL;  Surgeon: Milus Banister, MD;  Location:  [facility];  Service: Endoscopy;  Laterality: N/A;  . IMPLANTABLE CARDIOVERTER DEFIBRILLATOR IMPLANT N/A 08/05/2011   Primary prevention SJM ICD implanted,  Analyze ST study patient  . INNER EAR SURGERY     left x 17  . LUMBAR DISC SURGERY  02/2008   fusion  . NASAL SEPTUM SURGERY    . THORACIC AORTIC ENDOVASCULAR STENT GRAFT N/A 03/31/2018   Procedure: THORACIC AORTIC ENDOVASCULAR STENT GRAFT;  Surgeon: Serafina Mitchell, MD;  Location: [facility] OR;  Service: Vascular;  Laterality: N/A;  . TOTAL ABDOMINAL HYSTERECTOMY     complete     Current Meds  Medication Sig  . amitriptyline (ELAVIL) 50 MG tablet Take 1 tablet (50 mg total) by mouth at bedtime.  Marland Kitchen aspirin 81 MG tablet Take 81 mg by mouth every morning.   . bisoprolol (ZEBETA) 5 MG tablet TAKE 1 TABLET BY MOUTH DAILY.  Marland Kitchen dexlansoprazole (DEXILANT) 60 MG capsule Take 1 capsule (60 mg total) by mouth daily.  Marland Kitchen dicyclomine (BENTYL) 10 MG capsule TAKE 1 CAPSULE BY MOUTH EVERY 8 HOURS AS NEEDED FOR SPASMS.  . famotidine (PEPCID) 20 MG tablet Take 1 tablet (20 mg total) by mouth 2 (two) times daily.  . furosemide  (LASIX) 20 MG tablet Take 2 tablets (40 mg total) by mouth daily.  . polyethylene glycol powder (GLYCOLAX/MIRALAX) powder Take 17 g by mouth daily as needed for mild constipation.  Marland Kitchen PREMARIN vaginal cream   . PROAIR HFA 108 (90 Base) MCG/ACT inhaler INHALE 2 PUFFS INTO THE LUNGS EVERY 6 HOURS AS NEEDED. FOR SHORTNESS OF BREATH  . rosuvastatin (CRESTOR) 20 MG tablet TAKE 1 TABLET BY MOUTH AT BEDTIME.  . sacubitril-valsartan (ENTRESTO) 49-51 MG Take 1 tablet by mouth 2 (two) times daily.  . sucralfate (CARAFATE) 1 GM/10ML suspension Take 10 mLs (1 g total) by mouth 4 (four) times daily -  with meals and at bedtime.  . topiramate (TOPAMAX) 50 MG tablet Take 1 tablet (50 mg total) by mouth daily.  . [DISCONTINUED] HYDROcodone-acetaminophen (NORCO/VICODIN) 5-325 MG tablet Take 1 tablet by mouth every 6 (six) hours as needed for moderate pain.     Allergies:   Lisinopril; Sulfa antibiotics; and Sulfonamide derivatives   Social History   Tobacco Use  . Smoking status: Current Some Day Smoker    Packs/day: 0.13    Years: 30.00    Pack years: 3.90    Types: Cigarettes  . Smokeless tobacco: Never Used  . Tobacco comment: Restarted smoking.  few cig. daily  Substance Use Topics  . Alcohol use: No    Alcohol/week: 0.0 standard drinks  . Drug use: No     Family Hx: The patient's family history includes Bladder Cancer in her brother; Breast cancer in her cousin; Cancer in her son; Colon cancer in her brother and mother.  ROS:   Please see the history of present illness.     All other systems reviewed and are negative.   Prior CV studies:   The following studies were reviewed today:  Chest/Abd CTA 04/26/18 IMPRESSION: 1. Good appearance of stent graft extending from the distal arch into the proximal descending thoracic segment, with exclusion of saccular aneurysm, no endoleak. 2. Coverage and occlusion of left subclavian artery origin, with patent left carotid subclavian bypass. 3.  Bilateral renal artery ostial stenoses of probable hemodynamic significance. 4. Aortoiliac atherosclerosis (ICD10-170.0).  ABI 03/15/18 Final Interpretation: Right: Resting right ankle-brachial index is within normal range. No evidence of significant right lower extremity arterial  disease. The right toe-brachial index is abnormal. RT great toe pressure = 75 mmHg. Left: Resting left ankle-brachial index is within normal range. No evidence of significant left lower extremity arterial disease. The left toe-brachial index is normal. LT Great toe pressure = 99 mmHg.  Limited Echo 10/22/16 EF 25-30, apical and inf  HK  Echo 09/01/16 EF 25-30, ant-sept and apical AK, Gr 1 DD, mild LAE  Carotid US 09/08/16 bilat 1-39  LHC (06/2010):  prox LAD stent ok with 30-50% ISR, prox to mid RCA 30-40%.  Echo (04/2011):  Septal apical and ant HK, cannot r/o mural apical clot, EF 25%, mild LAE.  Nuclear (07/2013):  Large scar involving apex and apical ant and apical septal segments, no ischemia, EF 25%; High Risk  Labs/Other Tests and Data Reviewed:    EKG:  No ECG reviewed.  Recent Labs: 03/31/2018: Magnesium 2.0 04/01/2018: Platelets 150 04/15/2018: Hemoglobin 12.9 05/12/2018: NT-Pro BNP 699 10/15/2018: ALT 15; BUN 24; Creatinine, Ser 1.64; Potassium 4.7; Sodium 144   Recent Lipid Panel Lab Results  Component Value Date/Time   CHOL 138 10/15/2018 08:10 AM   TRIG 159 (H) 10/15/2018 08:10 AM   HDL 47 10/15/2018 08:10 AM   CHOLHDL 2.9 10/15/2018 08:10 AM   CHOLHDL 2.7 01/15/2016 01:45 PM   LDLCALC 59 10/15/2018 08:10 AM    Wt Readings from Last 3 Encounters:  09/29/18 172 lb 12.8 oz (78.4 kg)  08/05/18 172 lb 12.8 oz (78.4 kg)  07/29/18 171 lb 9.6 oz (77.8 kg)     Objective:    Vital Signs:  There were no vitals taken for this visit.   VITAL SIGNS:  reviewed GEN:  no acute distress RESPIRATORY:  No labored breathing NEURO:  Alert and oriented PSYCH:  She is somewhat anxious  about exposure to COVID-19  ASSESSMENT & PLAN:     Chronic systolic CHF (congestive heart failure) (HCC) EF 25-30.  NYHA 2b/3a.  Volume status seems to be stable.  Continue current dose of beta-blocker, angiotensin receptor neprilysin inhibitor.  She is unable to check her blood pressure.  She is currently under quarantine due to exposure COVID-19.  At this point, I do not think it is a good idea to try her on spironolactone.  We can follow-up again in a few months and determine if starting this drug is possible.  Coronary artery disease involving native coronary artery of native heart without angina pectoris Hx of MI in 2010 tx with BMS to the LAD.  She has had multiple PCI procedures due to in stent restenosis.  She has not had any anginal symptoms.  Continue aspirin, statin.  CKD (chronic kidney disease) stage 3, GFR 30-59 ml/min (HCC) Labs in March with stable creatinine.  Essential hypertension She is unable to monitor blood pressure at this time.  I have asked her to contact us if she cannot get a blood pressure machine to work.  We could provide her with a blood pressure machine through the [facility].  Hyperlipidemia, unspecified hyperlipidemia type LDL optimal on most recent lab work.  Continue current Rx.    Thoracic aortic aneurysm without rupture ([place]) Continue follow-up with vascular surgery.  Chronic obstructive pulmonary disease, unspecified COPD type ([place]) She has chronic shortness of breath.  COVID-19 Education: The signs and symptoms of COVID-19 were discussed with the patient and how to seek care for testing (follow up with PCP or arrange E-visit).  The importance of social distancing was discussed today.  As noted, her test  is currently pending post exposure.   Time:   Today, I have spent 19 minutes with the patient with telehealth technology discussing the above problems.     Medication Adjustments/Labs and Tests Ordered: Current medicines are reviewed at length  with the patient today.  Concerns regarding medicines are outlined above.   Tests Ordered: No orders of the defined types were placed in this encounter.   Medication Changes: No orders of the defined types were placed in this encounter.   Disposition:  Follow up in 2-3 month(s) with Richardson Dopp, PA-C   Signed, Richardson Dopp, PA-C  12/28/2018 5:06 PM    [place]

## 2018-12-28 ENCOUNTER — Other Ambulatory Visit: Payer: Self-pay

## 2018-12-28 ENCOUNTER — Ambulatory Visit: Payer: Self-pay

## 2018-12-28 ENCOUNTER — Telehealth (INDEPENDENT_AMBULATORY_CARE_PROVIDER_SITE_OTHER): Payer: Medicare Other | Admitting: Physician Assistant

## 2018-12-28 ENCOUNTER — Telehealth: Payer: Medicare Other | Admitting: Physician Assistant

## 2018-12-28 DIAGNOSIS — E785 Hyperlipidemia, unspecified: Secondary | ICD-10-CM

## 2018-12-28 DIAGNOSIS — I2510 Atherosclerotic heart disease of native coronary artery without angina pectoris: Secondary | ICD-10-CM

## 2018-12-28 DIAGNOSIS — I10 Essential (primary) hypertension: Secondary | ICD-10-CM

## 2018-12-28 DIAGNOSIS — N183 Chronic kidney disease, stage 3 unspecified: Secondary | ICD-10-CM

## 2018-12-28 DIAGNOSIS — I5022 Chronic systolic (congestive) heart failure: Secondary | ICD-10-CM

## 2018-12-28 DIAGNOSIS — I712 Thoracic aortic aneurysm, without rupture, unspecified: Secondary | ICD-10-CM

## 2018-12-28 DIAGNOSIS — J449 Chronic obstructive pulmonary disease, unspecified: Secondary | ICD-10-CM

## 2018-12-28 DIAGNOSIS — Z7189 Other specified counseling: Secondary | ICD-10-CM

## 2018-12-28 NOTE — Telephone Encounter (Signed)
Patient called and she says she was calling to receive her covid test results, results given and charted in result notes.

## 2018-12-28 NOTE — Patient Instructions (Addendum)
Medication Instructions:   Your physician recommends that you continue on your current medications as directed. Please refer to the Current Medication list given to you today.   If you need a refill on your cardiac medications before your next appointment, please call your pharmacy.   Lab work: NONE ORDERED  TODAY   If you have labs (blood work) drawn today and your tests are completely normal, you will receive your results only by: Marland Kitchen MyChart Message (if you have MyChart) OR . A paper copy in the mail If you have any lab test that is abnormal or we need to change your treatment, we will call you to review the results.  Testing/Procedures:NONE ORDERED  TODAY    Follow-Up: WEAVER 2 TO 3 MONTHS with   Any Other Special Instructions Will Be Listed Below (If Applicable).  CONTACT CLINIC WITH BP READINGS ONCE BLOOD PRESSURE MACHINE IS FIXED

## 2019-01-06 ENCOUNTER — Other Ambulatory Visit: Payer: Self-pay | Admitting: Surgery

## 2019-01-06 DIAGNOSIS — I712 Thoracic aortic aneurysm, without rupture, unspecified: Secondary | ICD-10-CM

## 2019-01-11 ENCOUNTER — Ambulatory Visit: Payer: Medicare Other | Admitting: Family Medicine

## 2019-01-13 ENCOUNTER — Telehealth: Payer: Self-pay | Admitting: Family Medicine

## 2019-01-13 NOTE — Telephone Encounter (Signed)
Called pt to see if she wanted to reschedule her appt that she cancelled on 01/12/2019 unable to lvm mail box not set up FR if pt calls please see if we can get her rescheduled FR

## 2019-01-14 ENCOUNTER — Telehealth: Payer: Medicare Other | Admitting: Family Medicine

## 2019-01-25 ENCOUNTER — Inpatient Hospital Stay: Admission: RE | Admit: 2019-01-25 | Payer: Medicare Other | Source: Ambulatory Visit

## 2019-01-31 ENCOUNTER — Encounter (HOSPITAL_COMMUNITY): Payer: Medicare Other

## 2019-01-31 ENCOUNTER — Telehealth (INDEPENDENT_AMBULATORY_CARE_PROVIDER_SITE_OTHER): Payer: Medicare Other | Admitting: Family Medicine

## 2019-01-31 ENCOUNTER — Ambulatory Visit: Payer: Medicare Other | Admitting: Surgery

## 2019-01-31 ENCOUNTER — Other Ambulatory Visit: Payer: Self-pay

## 2019-01-31 DIAGNOSIS — Z1231 Encounter for screening mammogram for malignant neoplasm of breast: Secondary | ICD-10-CM

## 2019-01-31 DIAGNOSIS — M810 Age-related osteoporosis without current pathological fracture: Secondary | ICD-10-CM | POA: Diagnosis not present

## 2019-01-31 DIAGNOSIS — G3184 Mild cognitive impairment, so stated: Secondary | ICD-10-CM | POA: Diagnosis not present

## 2019-01-31 DIAGNOSIS — R296 Repeated falls: Secondary | ICD-10-CM

## 2019-01-31 DIAGNOSIS — Z0001 Encounter for general adult medical examination with abnormal findings: Secondary | ICD-10-CM

## 2019-01-31 DIAGNOSIS — Z0000 Encounter for general adult medical examination without abnormal findings: Secondary | ICD-10-CM

## 2019-01-31 DIAGNOSIS — E7800 Pure hypercholesterolemia, unspecified: Secondary | ICD-10-CM | POA: Diagnosis not present

## 2019-01-31 DIAGNOSIS — R7303 Prediabetes: Secondary | ICD-10-CM

## 2019-01-31 DIAGNOSIS — I10 Essential (primary) hypertension: Secondary | ICD-10-CM

## 2019-01-31 NOTE — Progress Notes (Signed)
Patient is a25 y.o.female for medical wellness, having some back pain for the past 4 days. Completed 6 CIT, Functional status questionnaires. No advance directive , pt is aware to bring copy once documents are complete. No anxiety or depression.

## 2019-01-31 NOTE — Patient Instructions (Signed)
Preventive Care 38 Years and Older, Female Preventive care refers to lifestyle choices and visits with your health care provider that can promote health and wellness. This includes:  A yearly physical exam. This is also called an annual well check.  Regular dental and eye exams.  Immunizations.  Screening for certain conditions.  Healthy lifestyle choices, such as diet and exercise. What can I expect for my preventive care visit? Physical exam Your health care provider will check:  Height and weight. These may be used to calculate body mass index (BMI), which is a measurement that tells if you are at a healthy weight.  Heart rate and blood pressure.  Your skin for abnormal spots. Counseling Your health care provider may ask you questions about:  Alcohol, tobacco, and drug use.  Emotional well-being.  Home and relationship well-being.  Sexual activity.  Eating habits.  History of falls.  Memory and ability to understand (cognition).  Work and work Statistician.  Pregnancy and menstrual history. What immunizations do I need?  Influenza (flu) vaccine  This is recommended every year. Tetanus, diphtheria, and pertussis (Tdap) vaccine  You may need a Td booster every 10 years. Varicella (chickenpox) vaccine  You may need this vaccine if you have not already been vaccinated. Zoster (shingles) vaccine  You may need this after age 71. Pneumococcal conjugate (PCV13) vaccine  One dose is recommended after age 71. Pneumococcal polysaccharide (PPSV23) vaccine  One dose is recommended after age 71. Measles, mumps, and rubella (MMR) vaccine  You may need at least one dose of MMR if you were born in 1957 or later. You may also need a second dose. Meningococcal conjugate (MenACWY) vaccine  You may need this if you have certain conditions. Hepatitis A vaccine  You may need this if you have certain conditions or if you travel or work in places where you may be exposed  to hepatitis A. Hepatitis B vaccine  You may need this if you have certain conditions or if you travel or work in places where you may be exposed to hepatitis B. Haemophilus influenzae type b (Hib) vaccine  You may need this if you have certain conditions. You may receive vaccines as individual doses or as more than one vaccine together in one shot (combination vaccines). Talk with your health care provider about the risks and benefits of combination vaccines. What tests do I need? Blood tests  Lipid and cholesterol levels. These may be checked every 5 years, or more frequently depending on your overall health.  Hepatitis C test.  Hepatitis B test. Screening  Lung cancer screening. You may have this screening every year starting at age 71 if you have a 30-pack-year history of smoking and currently smoke or have quit within the past 15 years.  Colorectal cancer screening. All adults should have this screening starting at age 71 and continuing until age 15. Your health care provider may recommend screening at age 71 if you are at increased risk. You will have tests every 1-10 years, depending on your results and the type of screening test.  Diabetes screening. This is done by checking your blood sugar (glucose) after you have not eaten for a while (fasting). You may have this done every 1-3 years.  Mammogram. This may be done every 1-2 years. Talk with your health care provider about how often you should have regular mammograms.  BRCA-related cancer screening. This may be done if you have a family history of breast, ovarian, tubal, or peritoneal cancers.  Other tests  Sexually transmitted disease (STD) testing.  Bone density scan. This is done to screen for osteoporosis. You may have this done starting at age 71. Follow these instructions at home: Eating and drinking  Eat a diet that includes fresh fruits and vegetables, whole grains, lean protein, and low-fat dairy products. Limit  your intake of foods with high amounts of sugar, saturated fats, and salt.  Take vitamin and mineral supplements as recommended by your health care provider.  Do not drink alcohol if your health care provider tells you not to drink.  If you drink alcohol: ? Limit how much you have to 0-1 drink a day. ? Be aware of how much alcohol is in your drink. In the [place] one drink equals one 12 oz bottle of beer (355 mL), one 5 oz glass of wine (148 mL), or one 1 oz glass of hard liquor (44 mL). Lifestyle  Take daily care of your teeth and gums.  Stay active. Exercise for at least 30 minutes on 5 or more days each week.  Do not use any products that contain nicotine or tobacco, such as cigarettes, e-cigarettes, and chewing tobacco. If you need help quitting, ask your health care provider.  If you are sexually active, practice safe sex. Use a condom or other form of protection in order to prevent STIs (sexually transmitted infections).  Talk with your health care provider about taking a low-dose aspirin or statin. What's next?  Go to your health care provider once a year for a well check visit.  Ask your health care provider how often you should have your eyes and teeth checked.  Stay up to date on all vaccines. This information is not intended to replace advice given to you by your health care provider. Make sure you discuss any questions you have with your health care provider. Document Released: 08/10/2015 Document Revised: 07/08/2018 Document Reviewed: 07/08/2018 Elsevier Patient Education  2020 [employer].

## 2019-01-31 NOTE — Progress Notes (Signed)
I connected with  [name] on 01/31/19 at 523pm by phone as other technology not available to patient and verified that I am speaking with the correct person using two identifiers. Patient is at home. I am in the office.   I discussed the limitations of evaluation and management by telemedicine. The patient expressed understanding and agreed to proceed.  Total visit time: 28 minutes  Presents today for [employer] Visit-Subsequent.   Date of last exam:  Nov 2018  Interpreter used for this visit? no  Patient Care Team: Rutherford Guys, MD as PCP - General (Family Medicine) [name], MD as PCP - Cardiology (Cardiology) Thompson Grayer, MD as PCP - Electrophysiology (Cardiology) Prescott Gum, Collier Salina, MD as Attending Physician (Cardiothoracic Surgery) Kary Kos, MD (Neurosurgery) Rozetta Nunnery, MD (Otolaryngology) Love, Alyson Locket, MD (Neurology) Thompson Grayer, MD as Consulting Physician (Cardiology) Armbruster, Carlota Raspberry, MD as Consulting Physician (Gastroenterology)   Other items to address today: none   Cancer Screening: Cervical: n/a Breast: ordered today, patient will scheduled after covid restrictions lifted, denies any h/o abnormal Colon: colonoscopy 2017, h/o polyps, mother had colon cancer  Other Screening: Last screening for diabetes: h/o prediabetes Lab Results  Component Value Date   HGBA1C 6.4 (H) 03/26/2018    Last lipid screening: takes statin Lab Results  Component Value Date   CHOL 138 10/15/2018   HDL 47 10/15/2018   LDLCALC 59 10/15/2018   TRIG 159 (H) 10/15/2018   CHOLHDL 2.9 10/15/2018    Dexa: 2017, osteoporosis, femur  ADVANCE DIRECTIVES: Discussed: yes Patient desires CPR (No ), mechanical ventilation (No ), prolonged artificial support (may include mechanical ventilation, tube/PEG feeding, etc) (No ). On File: no Materials Provided: n/a, patient has forms at home  Immunization status:  Immunization  History  Administered Date(s) Administered  . Influenza Nasal 04/27/2017  . Influenza Split 08/06/2011  . Influenza Whole 04/27/2009  . Influenza, High Dose Seasonal PF 04/29/2013, 06/09/2014, 05/12/2015, 04/03/2016, 04/21/2017, 04/17/2018, 04/25/2018  . Influenza-Unspecified 04/15/2012  . Pneumococcal Conjugate-13 06/14/2014, 06/16/2018  . Pneumococcal Polysaccharide-23 04/27/2009, 08/06/2011, 04/15/2012, 04/03/2016  . Zoster 05/03/2013     Health Maintenance Due  Topic Date Due  . MAMMOGRAM  03/27/2012  . DEXA SCAN  08/07/2012     Functional Status Survey: Is the patient deaf or have difficulty hearing?: No Does the patient have difficulty seeing, even when wearing glasses/contacts?: No Does the patient have difficulty concentrating, remembering, or making decisions?: No Does the patient have difficulty walking or climbing stairs?: Yes Does the patient have difficulty dressing or bathing?: No Does the patient have difficulty doing errands alone such as visiting a doctor's office or shopping?: No   6CIT Screen 01/31/2019  What Year? 0 points  What month? 0 points  What time? 0 points  Count back from 20 0 points  Months in reverse 4 points  Repeat phrase 0 points  Total Score 4     Home Environment: recurring falls due to balance. Denies any trip hazards, has grab bars and night lights  Urinary Incontinence Screening: nocturia x 2  Patient Active Problem List   Diagnosis Date Noted  . Grief at loss of child 08/05/2018  . Colon polyp 06/16/2018  . Thoracic aortic aneurysm ([place]) 03/31/2018  . Chronic renal insufficiency   . AICD (automatic cardioverter/defibrillator) present   . Chronic systolic CHF (congestive heart failure) ([place])   . COPD (chronic obstructive pulmonary disease) ([place])   . Hyperlipemia   .  Insomnia   . Migraine headache without aura   . Pre-diabetes 06/19/2017  . Abnormal LFTs 06/19/2017  . Abnormal CT scan, stomach   . Gastric polyp   .  Osteoporosis 05/11/2013  . Hearing loss 04/29/2013  . Mild cognitive impairment 04/29/2013  . Headache 01/03/2013  . Tremor 08/13/2012  . 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 ICD-St.Jude 08/06/2011  . Cardiomyopathy, ischemic 06/12/2011  . DDD (degenerative disc disease), lumbar 05/16/2011  . History of pulmonary embolus (PE) 01/02/2011  . UNSPECIFIED ANEMIA 07/03/2010  . Essential hypertension 09/05/2009  . DIVERTICULOSIS-COLON 08/14/2009  . ABDOMINAL PAIN, LEFT LOWER QUADRANT 08/14/2009  . Hyperlipidemia, unspecified 04/24/2009  . DYSPHAGIA UNSPECIFIED 04/24/2009  . PERSONAL HX COLONIC POLYPS 04/24/2009  . TOBACCO ABUSE 04/04/2009  . CAD (coronary artery disease) 02/25/2009  . MASTOIDITIS 10/19/2007  . GERD 10/19/2007  . IBS 10/19/2007  . COLONIC POLYPS, ADENOMATOUS 05/12/2007     Past Medical History:  Diagnosis Date  . Abdominal pain, left lower quadrant 08/14/2009   Qualifier: Diagnosis of  By: Laney Potash, Pam    . Abnormal CT scan, stomach   . Abnormal LFTs 06/19/2017  . AICD (automatic cardioverter/defibrillator) present    Dr.Allred follows  . Aortic arch pseudoaneurysm (HCC)    a. followed by Dr. Prescott Gum.  . Arthritis   . Asthma   . Benign neoplasm of colon   . CAD (coronary artery disease) 02/2009   a. anterior STEMI rx with BMS to prox LAD in 02/2009. b. ISR s/p PTCA 06/2009. c. ISR s/p thrombectomy & PTCA 03/2010 due to late stent thrombosis.   . Cardiomyopathy, ischemic 06/12/2011  . Chronic renal insufficiency    stage 3  . Chronic systolic CHF (congestive heart failure) ([place])    a. s/p ST. Jude ICD 2011/11/10.  Marland Kitchen Chronic systolic heart failure ([place]) 08/05/2011  . Colon polyp   . Complication of anesthesia   . COPD (chronic obstructive pulmonary disease) ([place])   . CORONARY ARTERY DISEASE 04/24/2009   Qualifier: Diagnosis of  By: Trellis Paganini PA-c, Amy S   . DDD (degenerative disc disease), lumbar 05/16/2011  . Depression    "since my son died in  12-11-2022" 09-Nov-2017)  . Diverticulosis   . DIVERTICULOSIS-COLON 08/14/2009   Qualifier: Diagnosis of  By: Trellis Paganini PA-c, Amy S   . DYSPHAGIA UNSPECIFIED 04/24/2009   Qualifier: Diagnosis of  By: Laney Potash, Pam    . Dyspnea    "when I lay down at night"   . Essential hypertension 09/05/2009   Qualifier: Diagnosis of  By: Harvest Dark CMA, Anderson Malta    . Gastric polyp   . GERD 10/19/2007   Qualifier: Diagnosis of  By: Rennie Plowman RN, Blanca Friend: Diagnosis of  By: Laney Potash, Pam    . GERD (gastroesophageal reflux disease)   . GI bleed    a. h/o GIB on DAPT, now on ASA only.  Marland Kitchen Headache 01/03/2013  . Hearing loss    left ear  . History of pulmonary embolus (PE) 01/02/2011  . Hyperlipemia   . Hyperlipidemia, unspecified 04/24/2009   Qualifier: Diagnosis of  By: Trellis Paganini PA-c, Amy S   . Hypertension   . ICD-St.Jude 08/06/2011   S/p St. Jude ICD placement 08/05/11   . Insomnia   . Irritable bowel syndrome   . Ischemic cardiomyopathy    EF 10-15%  . Memory loss   . Migraine headache without aura   . Myocardial infarct ([place]) 11/09/2009 x 2   Dr.  Cooper,cardiology   . PERSONAL HX COLONIC POLYPS 04/24/2009   Qualifier: Diagnosis of  By: Shane Crutch, Amy S November, 2011 colonoscopy demonstrated a sessile cecal polyp and sigmoid polyp   . Pneumonia   . PONV (postoperative nausea and vomiting)   . Pre-diabetes   . 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 Pulmonary embolism ([place]) 2011  . Stroke [facility])    'When I was young." no residual  . Thoracic aortic aneurysm ([place]) 03/31/2018  . TOBACCO ABUSE 04/04/2009   Qualifier: Diagnosis of  By: Arvid Right  Qualifier: Diagnosis of  By: [name], MD, Jaquelyn Bitter   . Transfusion history    ?'12 or '13  . UNSPECIFIED ANEMIA 07/03/2010   Qualifier: Diagnosis of  By: Shelda Pal    . Unspecified mastoiditis      Past Surgical History:  Procedure Laterality Date  . ANGIOPLASTY  07/02/09, 04/01/10  . BILATERAL SALPINGOOPHORECTOMY     . CAD( bare metal stent)  02/2009   x 1  . CAROTID-SUBCLAVIAN BYPASS GRAFT Left 03/31/2018   Procedure: LEFT SUBCLAVIAN ARTERY BYPASS GRAFT;  Surgeon: Serafina Mitchell, MD;  Location: [place];  Service: Vascular;  Laterality: Left;  . CERVICAL SPINE SURGERY  08/08  . COLONOSCOPY WITH PROPOFOL N/A 04/29/2016   Procedure: COLONOSCOPY WITH PROPOFOL;  Surgeon: [name], MD;  Location: [facility];  Service: Gastroenterology;  Laterality: N/A;  . ESOPHAGOGASTRODUODENOSCOPY N/A 12/09/2016   Procedure: ESOPHAGOGASTRODUODENOSCOPY (EGD);  Surgeon: [name], MD;  Location: [facility];  Service: Gastroenterology;  Laterality: N/A;  . ESOPHAGOGASTRODUODENOSCOPY (EGD) WITH PROPOFOL N/A 04/29/2016   Procedure: ESOPHAGOGASTRODUODENOSCOPY (EGD) WITH PROPOFOL;  Surgeon: [name], MD;  Location: [facility];  Service: Gastroenterology;  Laterality: N/A;  . EUS N/A 05/15/2016   Procedure: UPPER ENDOSCOPIC ULTRASOUND (EUS) RADIAL;  Surgeon: Milus Banister, MD;  Location: [facility];  Service: Endoscopy;  Laterality: N/A;  . IMPLANTABLE CARDIOVERTER DEFIBRILLATOR IMPLANT N/A 08/05/2011   Primary prevention SJM ICD implanted,  Analyze ST study patient  . INNER EAR SURGERY     left x 17  . LUMBAR DISC SURGERY  02/2008   fusion  . NASAL SEPTUM SURGERY    . THORACIC AORTIC ENDOVASCULAR STENT GRAFT N/A 03/31/2018   Procedure: THORACIC AORTIC ENDOVASCULAR STENT GRAFT;  Surgeon: Serafina Mitchell, MD;  Location[facility];  Service: Vascular;  Laterality: N/A;  . TOTAL ABDOMINAL HYSTERECTOMY     complete     Family History  Problem Relation Age of Onset  . Colon cancer Mother   . Colon cancer Brother   . Bladder Cancer Brother   . Breast cancer Cousin   . Cancer Son      Social History   Socioeconomic History  . Marital status: Divorced    Spouse name: Not on file  . Number of children: 2  . Years of education: 56  . Highest education level: Not on file   Occupational History  . Occupation: Merchandiser, retail: UNEMPLOYED    Employer: DISABLED  Social Needs  . Financial resource strain: Not on file  . Food insecurity    Worry: Not on file    Inability: Not on file  . Transportation needs    Medical: Not on file    Non-medical: Not on file  Tobacco Use  . Smoking status: Current Some Day Smoker    Packs/day: 0.13    Years: 30.00    Pack years: 3.90  Types: Cigarettes  . Smokeless tobacco: Never Used  . Tobacco comment: Restarted smoking.  few cig. daily  Substance and Sexual Activity  . Alcohol use: No    Alcohol/week: 0.0 standard drinks  . Drug use: No  . Sexual activity: Not on file  Lifestyle  . Physical activity    Days per week: Not on file    Minutes per session: Not on file  . Stress: Not on file  Relationships  . Social Herbalist on phone: Not on file    Gets together: Not on file    Attends religious service: Not on file    Active member of club or organization: Not on file    Attends meetings of clubs or organizations: Not on file    Relationship status: Not on file  . Intimate partner violence    Fear of current or ex partner: Not on file    Emotionally abused: Not on file    Physically abused: Not on file    Forced sexual activity: Not on file  Other Topics Concern  . Not on file  Social History Narrative   Pt lives in [place] alone.  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   Patient has 11 th grade education.Right handed.   Caffeine- one cup daily           Allergies  Allergen Reactions  . Lisinopril     cough  . Sulfa Antibiotics Other (See Comments)    Unknown, childhood allergy   . Sulfonamide Derivatives Other (See Comments)    UNSURE     Prior to Admission medications   Medication Sig Start Date End Date Taking? Authorizing Provider  amitriptyline (ELAVIL) 50 MG tablet Take 1 tablet (50 mg total) by mouth at bedtime. 11/22/18   Rutherford Guys, MD   aspirin 81 MG tablet Take 81 mg by mouth every morning.     [provider]  bisoprolol (ZEBETA) 5 MG tablet TAKE 1 TABLET BY MOUTH DAILY. 11/23/18   [name], MD  dexlansoprazole (DEXILANT) 60 MG capsule Take 1 capsule (60 mg total) by mouth daily. 09/27/18   Armbruster, Carlota Raspberry, MD  dicyclomine (BENTYL) 10 MG capsule TAKE 1 CAPSULE BY MOUTH EVERY 8 HOURS AS NEEDED FOR SPASMS. 03/16/18   Armbruster, Carlota Raspberry, MD  famotidine (PEPCID) 20 MG tablet Take 1 tablet (20 mg total) by mouth 2 (two) times daily. 07/29/18   Armbruster, Carlota Raspberry, MD  furosemide (LASIX) 20 MG tablet Take 2 tablets (40 mg total) by mouth daily. 05/04/18   Richardson Dopp T, PA-C  polyethylene glycol powder (GLYCOLAX/MIRALAX) powder Take 17 g by mouth daily as needed for mild constipation. 07/08/17   Rutherford Guys, MD  PREMARIN vaginal cream  05/25/18   [provider]  PROAIR HFA 108 (90 Base) MCG/ACT inhaler INHALE 2 PUFFS INTO THE LUNGS EVERY 6 HOURS AS NEEDED. FOR SHORTNESS OF BREATH 11/22/18   Rutherford Guys, MD  rosuvastatin (CRESTOR) 20 MG tablet TAKE 1 TABLET BY MOUTH AT BEDTIME. 11/23/18   [name], MD  sacubitril-valsartan (ENTRESTO) 49-51 MG Take 1 tablet by mouth 2 (two) times daily. 09/29/18 09/24/19  [name], MD  sucralfate (CARAFATE) 1 GM/10ML suspension Take 10 mLs (1 g total) by mouth 4 (four) times daily -  with meals and at bedtime. 07/29/18   Armbruster, Carlota Raspberry, MD  topiramate (TOPAMAX) 50 MG tablet Take 1 tablet (50 mg total) by mouth daily. 08/05/18  Dennie Bible, NP     Depression screen [facility] 2/9 12/18/2017 11/21/2017 10/29/2017 09/18/2017 06/19/2017  Decreased Interest 0 0 (No Data) 0 0  Down, Depressed, Hopeless 0 0 - 0 0  PHQ - 2 Score 0 0 - 0 0  Some recent data might be hidden     Fall Risk  08/05/2018 06/16/2018 03/16/2018 12/18/2017 11/21/2017  Falls in the past year? 1 0 No No No  Number falls in past yr: 1 - - - -  Injury with Fall? 0 - - - -  Risk for fall  due to : Impaired balance/gait - - - -  Follow up - - - - -      PHYSICAL EXAM: There were no vitals taken for this visit.   Wt Readings from Last 3 Encounters:  09/29/18 172 lb 12.8 oz (78.4 kg)  08/05/18 172 lb 12.8 oz (78.4 kg)  07/29/18 171 lb 9.6 oz (77.8 kg)     No exam data present    Physical Exam   Education/Counseling provided regarding diet and exercise, prevention of chronic diseases, smoking/tobacco cessation, if applicable, and reviewed "Covered Medicare Preventive Services."   ASSESSMENT/PLAN:  1. Medicare annual wellness visit, subsequent Routine HCM labs ordered. HCM reviewed/discussed. Anticipatory guidance regarding healthy weight, lifestyle and choices given. Referring to PT for strength and balance training.  2. Pre-diabetes - Hemoglobin A1c; Future  3. Age related osteoporosis, unspecified pathological fracture presence - DG Bone Density; Future  4. Pure hypercholesterolemia - Comprehensive metabolic panel; Future - Lipid panel; Future  5. Mild cognitive impairment Stable.   6. Essential hypertension - CBC; Future - TSH; Future  7. Recurrent falls - Ambulatory referral to Physical Therapy  8. Visit for screening mammogram - MM DIGITAL SCREENING BILATERAL; Future  Follow-up in 3 months

## 2019-02-14 ENCOUNTER — Ambulatory Visit (INDEPENDENT_AMBULATORY_CARE_PROVIDER_SITE_OTHER): Payer: Medicare Other | Admitting: *Deleted

## 2019-02-14 DIAGNOSIS — I255 Ischemic cardiomyopathy: Secondary | ICD-10-CM | POA: Diagnosis not present

## 2019-02-14 LAB — CUP PACEART REMOTE DEVICE CHECK
Date Time Interrogation Session: 20200720162201
Implantable Lead Implant Date: 20130108
Implantable Lead Location: 753860
Implantable Pulse Generator Implant Date: 20130108
Pulse Gen Serial Number: 1016523

## 2019-02-28 ENCOUNTER — Encounter: Payer: Self-pay | Admitting: Cardiology

## 2019-02-28 ENCOUNTER — Other Ambulatory Visit: Payer: Self-pay | Admitting: Cardiovascular Disease

## 2019-02-28 NOTE — Progress Notes (Signed)
Remote ICD transmission.   

## 2019-03-10 ENCOUNTER — Other Ambulatory Visit: Payer: Self-pay

## 2019-03-10 ENCOUNTER — Ambulatory Visit (INDEPENDENT_AMBULATORY_CARE_PROVIDER_SITE_OTHER): Payer: Medicare Other | Admitting: Family Medicine

## 2019-03-10 ENCOUNTER — Encounter: Payer: Self-pay | Admitting: Family Medicine

## 2019-03-10 VITALS — BP 128/80 | HR 76 | Temp 98.3°F | Ht 62.0 in | Wt 174.0 lb

## 2019-03-10 DIAGNOSIS — I10 Essential (primary) hypertension: Secondary | ICD-10-CM

## 2019-03-10 DIAGNOSIS — M549 Dorsalgia, unspecified: Secondary | ICD-10-CM

## 2019-03-10 DIAGNOSIS — E7800 Pure hypercholesterolemia, unspecified: Secondary | ICD-10-CM

## 2019-03-10 DIAGNOSIS — R7303 Prediabetes: Secondary | ICD-10-CM

## 2019-03-10 NOTE — Progress Notes (Signed)
8/13/202011:52 AM  [name] [birth year], 73 y.o., female [id number]  Chief Complaint  Patient presents with  . Back Pain    back pain that come and goes for 2 mos, thinks its post surgery. Says its metal pc in her bk. Says she also fall alot foot turns to cause her to fall.    HPI:   Patient is a 73 y.o. female with past medical history significant for CAD, CHF, PAD, HTN, HLP, IBS, GERD, migraines, CKD 3, COPD, grief, prediabeteswho presents today for followup  Last OV Nov 2019 Referred to derm for lesion on face - bcc  Increased carafate  Last cards OV June 2020 - telemedicine Saw ortho in May 2020 for las followup after foot fx  Patient reports that overall she is doing really well She has occ spasms/sharp pain right left upper back by her shoulder blade that respond to APAP  She reports her GERD is doing well She has gained weight, improved appetite Her right foot has healed well. She has mild right ankle laxity, easy to roll over if not careful, but it has been like that for years  She otherwise feels she is doing well Has no acute concerns Takes all her meds as prescribed Denies any side effects   Depression screen [facility] 2/9 12/18/2017 11/21/2017 10/29/2017  Decreased Interest 0 0 (No Data)  Down, Depressed, Hopeless 0 0 -  PHQ - 2 Score 0 0 -  Some recent data might be hidden    Fall Risk  03/10/2019 08/05/2018 06/16/2018 03/16/2018 12/18/2017  Falls in the past year? 1 1 0 No No  Number falls in past yr: 1 1 - - -  Injury with Fall? 1 0 - - -  Risk for fall due to : History of fall(s);Orthopedic patient Impaired balance/gait - - -  Follow up - - - - -     Allergies  Allergen Reactions  . Lisinopril     cough  . Sulfa Antibiotics Other (See Comments)    Unknown, childhood allergy   . Sulfonamide Derivatives Other (See Comments)    UNSURE    Prior to Admission medications   Medication Sig Start Date End Date Taking? Authorizing Provider   amitriptyline (ELAVIL) 50 MG tablet Take 1 tablet (50 mg total) by mouth at bedtime. 11/22/18  Yes Rutherford Guys, MD  aspirin 81 MG tablet Take 81 mg by mouth every morning.    Yes [provider]  bisoprolol (ZEBETA) 5 MG tablet TAKE 1 TABLET BY MOUTH DAILY. 11/23/18  Yes [name], MD  dexlansoprazole (DEXILANT) 60 MG capsule Take 1 capsule (60 mg total) by mouth daily. 09/27/18  Yes Armbruster, Carlota Raspberry, MD  dicyclomine (BENTYL) 10 MG capsule TAKE 1 CAPSULE BY MOUTH EVERY 8 HOURS AS NEEDED FOR SPASMS. 03/16/18  Yes Armbruster, Carlota Raspberry, MD  famotidine (PEPCID) 20 MG tablet Take 1 tablet (20 mg total) by mouth 2 (two) times daily. 07/29/18  Yes Armbruster, Carlota Raspberry, MD  furosemide (LASIX) 20 MG tablet Take 2 tablets (40 mg total) by mouth daily. 05/04/18  Yes Weaver, Scott T, PA-C  polyethylene glycol powder (GLYCOLAX/MIRALAX) powder Take 17 g by mouth daily as needed for mild constipation. 07/08/17  Yes Rutherford Guys, MD  PREMARIN vaginal cream  05/25/18  Yes [provider]  PROAIR HFA 108 (90 Base) MCG/ACT inhaler INHALE 2 PUFFS INTO THE LUNGS EVERY 6 HOURS AS NEEDED. FOR SHORTNESS OF BREATH 11/22/18  Yes Grant Fontana  M, MD  rosuvastatin (CRESTOR) 20 MG tablet TAKE 1 TABLET BY MOUTH AT BEDTIME. 03/01/19  Yes [name], MD  sacubitril-valsartan (ENTRESTO) 49-51 MG Take 1 tablet by mouth 2 (two) times daily. 09/29/18 09/24/19 Yes [name], MD  sucralfate (CARAFATE) 1 GM/10ML suspension Take 10 mLs (1 g total) by mouth 4 (four) times daily -  with meals and at bedtime. 07/29/18  Yes Armbruster, Carlota Raspberry, MD  topiramate (TOPAMAX) 50 MG tablet Take 1 tablet (50 mg total) by mouth daily. 08/05/18  Yes Dennie Bible, NP    Past Medical History:  Diagnosis Date  . Abdominal pain, left lower quadrant 08/14/2009   Qualifier: Diagnosis of  By: Laney Potash, Pam    . Abnormal CT scan, stomach   . Abnormal LFTs 06/19/2017  . AICD (automatic  cardioverter/defibrillator) present    Dr.Allred follows  . Aortic arch pseudoaneurysm (HCC)    a. followed by Dr. Prescott Gum.  . Arthritis   . Asthma   . Benign neoplasm of colon   . CAD (coronary artery disease) 02/2009   a. anterior STEMI rx with BMS to prox LAD in 02/2009. b. ISR s/p PTCA 06/2009. c. ISR s/p thrombectomy & PTCA 03/2010 due to late stent thrombosis.   . Cardiomyopathy, ischemic 06/12/2011  . Chronic renal insufficiency    stage 3  . Chronic systolic CHF (congestive heart failure) ([place])    a. s/p ST. Jude ICD 11/01/11.  Marland Kitchen Chronic systolic heart failure ([place]) 08/05/2011  . Colon polyp   . Complication of anesthesia   . COPD (chronic obstructive pulmonary disease) ([place])   . CORONARY ARTERY DISEASE 04/24/2009   Qualifier: Diagnosis of  By: Trellis Paganini PA-c, Amy S   . DDD (degenerative disc disease), lumbar 05/16/2011  . Depression    "since my son died in 12/02/2022" Oct 31, 2017)  . Diverticulosis   . DIVERTICULOSIS-COLON 08/14/2009   Qualifier: Diagnosis of  By: Trellis Paganini PA-c, Amy S   . DYSPHAGIA UNSPECIFIED 04/24/2009   Qualifier: Diagnosis of  By: Laney Potash, Pam    . Dyspnea    "when I lay down at night"   . Essential hypertension 09/05/2009   Qualifier: Diagnosis of  By: Harvest Dark CMA, Anderson Malta    . Gastric polyp   . GERD 10/19/2007   Qualifier: Diagnosis of  By: Rennie Plowman RN, Blanca Friend: Diagnosis of  By: Laney Potash, Pam    . GERD (gastroesophageal reflux disease)   . GI bleed    a. h/o GIB on DAPT, now on ASA only.  Marland Kitchen Headache 01/03/2013  . Hearing loss    left ear  . History of pulmonary embolus (PE) 01/02/2011  . Hyperlipemia   . Hyperlipidemia, unspecified 04/24/2009   Qualifier: Diagnosis of  By: Trellis Paganini PA-c, Amy S   . Hypertension   . ICD-St.Jude 08/06/2011   S/p St. Jude ICD placement 08/05/11   . Insomnia   . Irritable bowel syndrome   . Ischemic cardiomyopathy    EF 10-15%  . Memory loss   . Migraine headache without aura   . Myocardial infarct [facility]) 31-Oct-2009 x  2   Dr. [place] Nickel   . PERSONAL HX COLONIC POLYPS 04/24/2009   Qualifier: Diagnosis of  By: Shane Crutch, Amy S November, 2011 colonoscopy demonstrated a sessile cecal polyp and sigmoid polyp   . Pneumonia   . PONV (postoperative nausea and vomiting)   . Pre-diabetes   . Pseudoaneurysm of aortic arch ([place]) 05/05/2012  . Pulmonary embolism ([place])  2011  . Stroke [facility])    'When I was young." no residual  . Thoracic aortic aneurysm ([place]) 03/31/2018  . TOBACCO ABUSE 04/04/2009   Qualifier: Diagnosis of  By: Arvid Right  Qualifier: Diagnosis of  By: [name], MD, Jaquelyn Bitter   . Transfusion history    ?'12 or '13  . UNSPECIFIED ANEMIA 07/03/2010   Qualifier: Diagnosis of  By: Shelda Pal    . Unspecified mastoiditis     Past Surgical History:  Procedure Laterality Date  . ANGIOPLASTY  07/02/09, 04/01/10  . BILATERAL SALPINGOOPHORECTOMY    . CAD( bare metal stent)  02/2009   x 1  . CAROTID-SUBCLAVIAN BYPASS GRAFT Left 03/31/2018   Procedure: LEFT SUBCLAVIAN ARTERY BYPASS GRAFT;  Surgeon: Serafina Mitchell, MD;  Location: [facility];  Service: Vascular;  Laterality: Left;  . CERVICAL SPINE SURGERY  08/08  . COLONOSCOPY WITH PROPOFOL N/A 04/29/2016   Procedure: COLONOSCOPY WITH PROPOFOL;  Surgeon: [name], MD;  Location: [facility];  Service: Gastroenterology;  Laterality: N/A;  . ESOPHAGOGASTRODUODENOSCOPY N/A 12/09/2016   Procedure: ESOPHAGOGASTRODUODENOSCOPY (EGD);  Surgeon: [name], MD;  Location: [facility];  Service: Gastroenterology;  Laterality: N/A;  . ESOPHAGOGASTRODUODENOSCOPY (EGD) WITH PROPOFOL N/A 04/29/2016   Procedure: ESOPHAGOGASTRODUODENOSCOPY (EGD) WITH PROPOFOL;  Surgeon: [name], MD;  Location: [facility];  Service: Gastroenterology;  Laterality: N/A;  . EUS N/A 05/15/2016   Procedure: UPPER ENDOSCOPIC ULTRASOUND (EUS) RADIAL;  Surgeon: Milus Banister, MD;  Location: [facility];  Service: Endoscopy;   Laterality: N/A;  . IMPLANTABLE CARDIOVERTER DEFIBRILLATOR IMPLANT N/A 08/05/2011   Primary prevention SJM ICD implanted,  Analyze ST study patient  . INNER EAR SURGERY     left x 17  . LUMBAR DISC SURGERY  02/2008   fusion  . NASAL SEPTUM SURGERY    . THORACIC AORTIC ENDOVASCULAR STENT GRAFT N/A 03/31/2018   Procedure: THORACIC AORTIC ENDOVASCULAR STENT GRAFT;  Surgeon: Serafina Mitchell, MD;  Location: [facility];  Service: Vascular;  Laterality: N/A;  . TOTAL ABDOMINAL HYSTERECTOMY     complete    Social History   Tobacco Use  . Smoking status: Current Some Day Smoker    Packs/day: 0.13    Years: 30.00    Pack years: 3.90    Types: Cigarettes  . Smokeless tobacco: Never Used  . Tobacco comment: Restarted smoking.  few cig. daily  Substance Use Topics  . Alcohol use: No    Alcohol/week: 0.0 standard drinks    Family History  Problem Relation Age of Onset  . Colon cancer Mother   . Colon cancer Brother   . Bladder Cancer Brother   . Breast cancer Cousin   . Cancer Son     Review of Systems  Constitutional: Negative for chills and fever.  Respiratory: Negative for cough and shortness of breath.   Cardiovascular: Negative for chest pain, palpitations and leg swelling.  Gastrointestinal: Negative for abdominal pain, nausea and vomiting.     OBJECTIVE:  Today's Vitals   03/10/19 1136  BP: 128/80  Pulse: 76  Temp: 98.3 F (36.8 C)  TempSrc: Oral  SpO2: 96%  Weight: 174 lb (78.9 kg)  Height: 5\' 2"  (1.575 m)   Body mass index is 31.83 kg/m.  Wt Readings from Last 3 Encounters:  03/10/19 174 lb (78.9 kg)  09/29/18 172 lb 12.8 oz (78.4 kg)  08/05/18 172 lb 12.8 oz (78.4 kg)   Physical Exam Vitals signs and nursing note  reviewed.  Constitutional:      Appearance: She is well-developed.  HENT:     Head: Normocephalic and atraumatic.     Mouth/Throat:     Pharynx: No oropharyngeal exudate.  Eyes:     General: No scleral icterus.    Conjunctiva/sclera:  Conjunctivae normal.     Pupils: Pupils are equal, round, and reactive to light.  Neck:     Musculoskeletal: Neck supple.  Cardiovascular:     Rate and Rhythm: Normal rate and regular rhythm.     Heart sounds: Normal heart sounds. No murmur. No friction rub. No gallop.   Pulmonary:     Effort: Pulmonary effort is normal.     Breath sounds: Normal breath sounds. No wheezing or rales.  Musculoskeletal:       Back:     Right lower leg: No edema.     Left lower leg: No edema.  Skin:    General: Skin is warm and dry.  Neurological:     Mental Status: She is alert and oriented to person, place, and time.     ASSESSMENT and PLAN  1. Upper back pain on left side Continue with conservative measures. Discussed pressure points.   2. Pure hypercholesterolemia Checking labs today, medications will be adjusted as needed. LDL goal < 70 given CAD. - Comprehensive metabolic panel - Lipid panel  3. Essential hypertension Controlled. Continue current regime.  - TSH  4. Pre-diabetes Labs pending. Discussed LFM. - Hemoglobin A1c  Return in about 6 months (around 09/10/2019).    Rutherford Guys, MD [facility] [place], [place] [address] Ph.  [phone] Fax [phone]

## 2019-03-11 LAB — COMPREHENSIVE METABOLIC PANEL
ALT: 11 IU/L (ref 0–32)
AST: 17 IU/L (ref 0–40)
Albumin/Globulin Ratio: 1.9 (ref 1.2–2.2)
Albumin: 4.4 g/dL (ref 3.7–4.7)
Alkaline Phosphatase: 96 IU/L (ref 39–117)
BUN/Creatinine Ratio: 16 (ref 12–28)
BUN: 25 mg/dL (ref 8–27)
Bilirubin Total: 0.3 mg/dL (ref 0.0–1.2)
CO2: 19 mmol/L — ABNORMAL LOW (ref 20–29)
Calcium: 9 mg/dL (ref 8.7–10.3)
Chloride: 108 mmol/L — ABNORMAL HIGH (ref 96–106)
Creatinine, Ser: 1.58 mg/dL — ABNORMAL HIGH (ref 0.57–1.00)
GFR calc Af Amer: 38 mL/min/{1.73_m2} — ABNORMAL LOW (ref >59)
GFR calc non Af Amer: 33 mL/min/{1.73_m2} — ABNORMAL LOW (ref >59)
Globulin, Total: 2.3 g/dL (ref 1.5–4.5)
Glucose: 96 mg/dL (ref 65–99)
Potassium: 5 mmol/L (ref 3.5–5.2)
Sodium: 139 mmol/L (ref 134–144)
Total Protein: 6.7 g/dL (ref 6.0–8.5)

## 2019-03-11 LAB — LIPID PANEL
Chol/HDL Ratio: 2.8 ratio (ref 0.0–4.4)
Cholesterol, Total: 149 mg/dL (ref 100–199)
HDL: 53 mg/dL (ref >39)
LDL Calculated: 70 mg/dL (ref 0–99)
Triglycerides: 129 mg/dL (ref 0–149)
VLDL Cholesterol Cal: 26 mg/dL (ref 5–40)

## 2019-03-11 LAB — HEMOGLOBIN A1C
Est. average glucose Bld gHb Est-mCnc: 126 mg/dL
Hgb A1c MFr Bld: 6 % — ABNORMAL HIGH (ref 4.8–5.6)

## 2019-03-11 LAB — TSH: TSH: 4.27 u[IU]/mL (ref 0.450–4.500)

## 2019-03-28 NOTE — Progress Notes (Signed)
Virtual Visit via Telephone Note   This visit type was conducted due to national recommendations for restrictions regarding the COVID-19 Pandemic (e.g. social distancing) in an effort to limit this patient's exposure and mitigate transmission in our community.  Due to her co-morbid illnesses, this patient is at least at moderate risk for complications without adequate follow up.  This format is felt to be most appropriate for this patient at this time.  The patient did not have access to video technology/had technical difficulties with video requiring transitioning to audio format only (telephone).  All issues noted in this document were discussed and addressed.  No physical exam could be performed with this format.  Please refer to the patient's chart for her  consent to telehealth for [facility].   Date:  03/29/2019   ID:  [name], DOB 03-Aug-1945, MRN [id number]  Patient Location: Home Provider Location: Home  PCP:  Rutherford Guys, MD  Cardiologist:  [name], MD   Electrophysiologist:  Thompson Grayer, MD   Evaluation Performed:  Follow-Up Visit  Chief Complaint: CAD, chest pain, CHF  History of Present Illness:    TEAGAN OZAWA is a 73 y.o. female with:  Coronary artery disease   S/p anterior MI in 2010 >> PCI: BMS to prox LAD  S/p multiple PCIs since MI in 2010 2/2 ISR  Chronic systolic CHF  Ischemic CM  S/p AICD  Hx of pulmonary embolism   COPD  Chronic kidney disease   Prior Gi bleeding   Hypertension   Hyperlipidemia   Thoracic aortic aneurysm  S/p L carotid-subclavian transposition and endovascular repair of aneurysm 03/2018   Ms. Rester was last seen in 12/2018.  Today, she notes occasional episodes of chest discomfort.  She has some soreness 2 days ago when she awoke.  It is not clear if this awoke her from sleep.  She has had a lot of discomfort that she has felt is related to indigestion.  However, she has had some discomfort that  reminded her a little of her previous angina.  She has not taken nitroglycerin because she does not have any.  This is not related to exertion.  It is not really related to meals.  Her acid reflux medications have been adjusted without significant improvement.  She has sometimes taken antacids with relief.  She has chronic shortness of breath without change.  She has not had syncope, paroxysmal nocturnal dyspnea, leg swelling.  Weights have been stable.  The patient does not have symptoms concerning for COVID-19 infection (fever, chills, cough, or new shortness of breath).    Past Medical History:  Diagnosis Date  . Abdominal pain, left lower quadrant 08/14/2009   Qualifier: Diagnosis of  By: Laney Potash, Pam    . Abnormal CT scan, stomach   . Abnormal LFTs 06/19/2017  . AICD (automatic cardioverter/defibrillator) present    Dr.Allred follows  . Aortic arch pseudoaneurysm (HCC)    a. followed by Dr. Prescott Gum.  . Arthritis   . Asthma   . Benign neoplasm of colon   . CAD (coronary artery disease) 02/2009   a. anterior STEMI rx with BMS to prox LAD in 02/2009. b. ISR s/p PTCA 06/2009. c. ISR s/p thrombectomy & PTCA 03/2010 due to late stent thrombosis.   . Cardiomyopathy, ischemic 06/12/2011  . Chronic renal insufficiency    stage 3  . Chronic systolic CHF (congestive heart failure) ([place])    a. s/p ST. Jude ICD 2013.  Marland Kitchen  Chronic systolic heart failure ([place]) 08/05/2011  . Colon polyp   . Complication of anesthesia   . COPD (chronic obstructive pulmonary disease) ([place])   . CORONARY ARTERY DISEASE 04/24/2009   Qualifier: Diagnosis of  By: Trellis Paganini PA-c, Amy S   . DDD (degenerative disc disease), lumbar 05/16/2011  . Depression    "since my son died in 12-10-22" 11-08-17)  . Diverticulosis   . DIVERTICULOSIS-COLON 08/14/2009   Qualifier: Diagnosis of  By: Trellis Paganini PA-c, Amy S   . DYSPHAGIA UNSPECIFIED 04/24/2009   Qualifier: Diagnosis of  By: Laney Potash, Pam    . Dyspnea    "when I lay down  at night"   . Essential hypertension 09/05/2009   Qualifier: Diagnosis of  By: Harvest Dark CMA, Anderson Malta    . Gastric polyp   . GERD 10/19/2007   Qualifier: Diagnosis of  By: Rennie Plowman RN, Blanca Friend: Diagnosis of  By: Laney Potash, Pam    . GERD (gastroesophageal reflux disease)   . GI bleed    a. h/o GIB on DAPT, now on ASA only.  Marland Kitchen Headache 01/03/2013  . Hearing loss    left ear  . History of pulmonary embolus (PE) 01/02/2011  . Hyperlipemia   . Hyperlipidemia, unspecified 04/24/2009   Qualifier: Diagnosis of  By: Trellis Paganini PA-c, Amy S   . Hypertension   . ICD-St.Jude 08/06/2011   S/p St. Jude ICD placement 08/05/11   . Insomnia   . Irritable bowel syndrome   . Ischemic cardiomyopathy    EF 10-15%  . Memory loss   . Migraine headache without aura   . Myocardial infarct [facility]) 11-08-2009 x 2   Dr. Roswell Nickel   . PERSONAL HX COLONIC POLYPS 04/24/2009   Qualifier: Diagnosis of  By: Shane Crutch, Amy S November, 2011 colonoscopy demonstrated a sessile cecal polyp and sigmoid polyp   . Pneumonia   . PONV (postoperative nausea and vomiting)   . Pre-diabetes   . Pseudoaneurysm of aortic arch ([place]) 05/05/2012  . Pulmonary embolism ([place]) 11/08/2009  . Stroke [facility])    'When I was young." no residual  . Thoracic aortic aneurysm ([place]) 03/31/2018  . TOBACCO ABUSE 04/04/2009   Qualifier: Diagnosis of  By: Arvid Right  Qualifier: Diagnosis of  By: [name], MD, Jaquelyn Bitter   . Transfusion history    ?'12 or '13  . UNSPECIFIED ANEMIA 07/03/2010   Qualifier: Diagnosis of  By: Shelda Pal    . Unspecified mastoiditis    Past Surgical History:  Procedure Laterality Date  . ANGIOPLASTY  07/02/09, 04/01/10  . BILATERAL SALPINGOOPHORECTOMY    . CAD( bare metal stent)  02/2009   x 1  . CAROTID-SUBCLAVIAN BYPASS GRAFT Left 03/31/2018   Procedure: LEFT SUBCLAVIAN ARTERY BYPASS GRAFT;  Surgeon: Serafina Mitchell, MD;  Location: [place];  Service: Vascular;  Laterality: Left;  . CERVICAL  SPINE SURGERY  08/08  . COLONOSCOPY WITH PROPOFOL N/A 04/29/2016   Procedure: COLONOSCOPY WITH PROPOFOL;  Surgeon: [name], MD;  Location: [facility];  Service: Gastroenterology;  Laterality: N/A;  . ESOPHAGOGASTRODUODENOSCOPY N/A 12/09/2016   Procedure: ESOPHAGOGASTRODUODENOSCOPY (EGD);  Surgeon: [name], MD;  Location[facility];  Service: Gastroenterology;  Laterality: N/A;  . ESOPHAGOGASTRODUODENOSCOPY (EGD) WITH PROPOFOL N/A 04/29/2016   Procedure: ESOPHAGOGASTRODUODENOSCOPY (EGD) WITH PROPOFOL;  Surgeon: [name], MD;  Location: [facility];  Service: Gastroenterology;  Laterality: N/A;  . EUS N/A 05/15/2016   Procedure: UPPER ENDOSCOPIC ULTRASOUND (EUS) RADIAL;  Surgeon: Milus Banister, MD;  Location: [facility];  Service: Endoscopy;  Laterality: N/A;  . IMPLANTABLE CARDIOVERTER DEFIBRILLATOR IMPLANT N/A 08/05/2011   Primary prevention SJM ICD implanted,  Analyze ST study patient  . INNER EAR SURGERY     left x 17  . LUMBAR DISC SURGERY  02/2008   fusion  . NASAL SEPTUM SURGERY    . THORACIC AORTIC ENDOVASCULAR STENT GRAFT N/A 03/31/2018   Procedure: THORACIC AORTIC ENDOVASCULAR STENT GRAFT;  Surgeon: Serafina Mitchell, MD;  Location: [facility];  Service: Vascular;  Laterality: N/A;  . TOTAL ABDOMINAL HYSTERECTOMY     complete     Current Meds  Medication Sig  . amitriptyline (ELAVIL) 50 MG tablet Take 1 tablet (50 mg total) by mouth at bedtime.  Marland Kitchen aspirin 81 MG tablet Take 81 mg by mouth every morning.   Marland Kitchen dexlansoprazole (DEXILANT) 60 MG capsule Take 1 capsule (60 mg total) by mouth daily.  Marland Kitchen dicyclomine (BENTYL) 10 MG capsule TAKE 1 CAPSULE BY MOUTH EVERY 8 HOURS AS NEEDED FOR SPASMS.  . famotidine (PEPCID) 20 MG tablet Take 1 tablet (20 mg total) by mouth 2 (two) times daily.  . furosemide (LASIX) 20 MG tablet Take 2 tablets (40 mg total) by mouth daily.  . polyethylene glycol powder (GLYCOLAX/MIRALAX) powder Take 17 g by mouth daily  as needed for mild constipation.  Marland Kitchen PREMARIN vaginal cream   . PROAIR HFA 108 (90 Base) MCG/ACT inhaler INHALE 2 PUFFS INTO THE LUNGS EVERY 6 HOURS AS NEEDED. FOR SHORTNESS OF BREATH  . rosuvastatin (CRESTOR) 20 MG tablet TAKE 1 TABLET BY MOUTH AT BEDTIME.  . sacubitril-valsartan (ENTRESTO) 49-51 MG Take 1 tablet by mouth 2 (two) times daily.  . sucralfate (CARAFATE) 1 GM/10ML suspension Take 10 mLs (1 g total) by mouth 4 (four) times daily -  with meals and at bedtime.  . topiramate (TOPAMAX) 50 MG tablet Take 1 tablet (50 mg total) by mouth daily.  . [DISCONTINUED] bisoprolol (ZEBETA) 5 MG tablet TAKE 1 TABLET BY MOUTH DAILY.     Allergies:   Lisinopril, Sulfa antibiotics, and Sulfonamide derivatives   Social History   Tobacco Use  . Smoking status: Current Some Day Smoker    Packs/day: 0.13    Years: 30.00    Pack years: 3.90    Types: Cigarettes  . Smokeless tobacco: Never Used  . Tobacco comment: Restarted smoking.  few cig. daily  Substance Use Topics  . Alcohol use: No    Alcohol/week: 0.0 standard drinks  . Drug use: No     Family Hx: The patient's family history includes Bladder Cancer in her brother; Breast cancer in her cousin; Cancer in her son; Colon cancer in her brother and mother.  ROS:   Please see the history of present illness.     All other systems reviewed and are negative.   Prior CV studies:   The following studies were reviewed today:  Chest/Abd CTA 04/26/18 IMPRESSION: 1. Good appearance of stent graft extending from the distal arch into the proximal descending thoracic segment, with exclusion of saccular aneurysm, no endoleak. 2. Coverage and occlusion of left subclavian artery origin, with patent left carotid subclavian bypass. 3. Bilateral renal artery ostial stenoses of probable hemodynamic significance. 4. Aortoiliac atherosclerosis (ICD10-170.0).  ABI 03/15/18 Final Interpretation: Right: Resting right ankle-brachial index is within  normal range. No evidence of significant right lower extremity arterial disease. The right toe-brachial index is abnormal. RT great toe pressure = 75 mmHg.  Left: Resting left ankle-brachial index is within normal range. No evidence of significant left lower extremity arterial disease. The left toe-brachial index is normal. LT Great toe pressure = 99 mmHg.  Limited Echo 10/22/16 EF 25-30, apical and inf HK  Echo 09/01/16 EF 25-30, ant-sept and apical AK, Gr 1 DD, mild LAE  Carotid US 09/08/16 bilat 1-39  LHC (06/2010):  prox LAD stent ok with 30-50% ISR, prox to mid RCA 30-40%.  Echo (04/2011):  Septal apical and ant HK, cannot r/o mural apical clot, EF 25%, mild LAE.  Nuclear (07/2013):  Large scar involving apex and apical ant and apical septal segments, no ischemia, EF 25%; High Risk  Labs/Other Tests and Data Reviewed:    EKG:  No ECG reviewed.  Recent Labs: 03/31/2018: Magnesium 2.0 04/01/2018: Platelets 150 04/15/2018: Hemoglobin 12.9 05/12/2018: NT-Pro BNP 699 03/10/2019: ALT 11; BUN 25; Creatinine, Ser 1.58; Potassium 5.0; Sodium 139; TSH 4.270   Recent Lipid Panel Lab Results  Component Value Date/Time   CHOL 149 03/10/2019 11:58 AM   TRIG 129 03/10/2019 11:58 AM   HDL 53 03/10/2019 11:58 AM   CHOLHDL 2.8 03/10/2019 11:58 AM   CHOLHDL 2.7 01/15/2016 01:45 PM   LDLCALC 70 03/10/2019 11:58 AM    Wt Readings from Last 3 Encounters:  03/29/19 174 lb (78.9 kg)  03/10/19 174 lb (78.9 kg)  09/29/18 172 lb 12.8 oz (78.4 kg)     Objective:    Vital Signs:  Ht 5\' 2"  (1.575 m)   Wt 174 lb (78.9 kg)   BMI 31.83 kg/m    VITAL SIGNS:  reviewed GEN:  no acute distress RESPIRATORY:  No labored breathing NEURO:  Alert and oriented PSYCH:  Normal mood  ASSESSMENT & PLAN:    1. Chronic systolic CHF (congestive heart failure) (HCC) EF 25-30.  NYHA 2b.  Volume status seems to be stable.  Overall, her breathing has improved since starting on Entresto.  She has mild  chronic kidney disease.  Her most recent potassium was 5.  At this point, I am not sure that we should try her on spironolactone.  We can certainly consider this in the future.  Continue current medical therapy.  2. Coronary artery disease involving native coronary artery of native heart with angina pectoris ([place]) History of myocardial infarction in 2010 treated with bare-metal stent to the LAD.  She had multiple PCI procedures since that time secondary to in-stent restenosis.  Her last nuclear stress test was in 2015 which demonstrated a large scar but no ischemia.  She is currently complaining of occasional episodes of chest discomfort.  Some of this seems to be consistent with dyspepsia.  However, she has had some symptoms that are somewhat reminiscent of her previous angina.  I will refill nitroglycerin for her.  She will continue on aspirin, statin therapy as well as beta-blocker.  I will arrange for a Lexiscan Myoview.  Follow-up with Dr. Burt Knack or me in 3 months in person, or sooner if her stress test is abnormal.  -Rx for prn NTG  -Lexiscan Myoview   3. Essential hypertension Most recent blood pressure with primary care was optimal.  Continue current therapy.  4. CKD (chronic kidney disease) stage 3, GFR 30-59 ml/min (HCC) Most recent creatinine stable.  As noted, I am not certain that she will tolerate spironolactone given her potassium is currently 5.   Time:   Today, I have spent 16 minutes with the patient with telehealth technology discussing the above problems.  Medication Adjustments/Labs and Tests Ordered: Current medicines are reviewed at length with the patient today.  Concerns regarding medicines are outlined above.   Tests Ordered: Orders Placed This Encounter  Procedures  . MYOCARDIAL PERFUSION IMAGING    Medication Changes: Meds ordered this encounter  Medications  . nitroGLYCERIN (NITROSTAT) 0.4 MG SL tablet    Sig: Place 1 tablet (0.4 mg total) under the  tongue every 5 (five) minutes as needed for chest pain.    Dispense:  25 tablet    Refill:  11    Order Specific Question:   Supervising Provider    Answer:   Lelon Perla [[id number]]    Follow Up:  In Person in 3 month(s)  Signed, Richardson Dopp, PA-C  03/29/2019 3:52 PM    [facility]

## 2019-03-29 ENCOUNTER — Other Ambulatory Visit: Payer: Self-pay

## 2019-03-29 ENCOUNTER — Encounter: Payer: Self-pay | Admitting: Physician Assistant

## 2019-03-29 ENCOUNTER — Other Ambulatory Visit: Payer: Self-pay | Admitting: Cardiovascular Disease

## 2019-03-29 ENCOUNTER — Telehealth (INDEPENDENT_AMBULATORY_CARE_PROVIDER_SITE_OTHER): Payer: Medicare Other | Admitting: Physician Assistant

## 2019-03-29 VITALS — Ht 62.0 in | Wt 174.0 lb

## 2019-03-29 DIAGNOSIS — R079 Chest pain, unspecified: Secondary | ICD-10-CM

## 2019-03-29 DIAGNOSIS — N183 Chronic kidney disease, stage 3 unspecified: Secondary | ICD-10-CM

## 2019-03-29 DIAGNOSIS — I5022 Chronic systolic (congestive) heart failure: Secondary | ICD-10-CM

## 2019-03-29 DIAGNOSIS — I25119 Atherosclerotic heart disease of native coronary artery with unspecified angina pectoris: Secondary | ICD-10-CM

## 2019-03-29 DIAGNOSIS — I10 Essential (primary) hypertension: Secondary | ICD-10-CM

## 2019-03-29 MED ORDER — NITROGLYCERIN 0.4 MG SL SUBL: 0.4000 mg | SUBLINGUAL_TABLET | SUBLINGUAL | 11 refills | Status: DC | PRN

## 2019-03-29 NOTE — Patient Instructions (Addendum)
Medication Instructions:  Your physician recommends that you continue on your current medications as directed. Please refer to the Current Medication list given to you today.  If you need a refill on your cardiac medications before your next appointment, please call your pharmacy.   Lab work: None   If you have labs (blood work) drawn today and your tests are completely normal, you will receive your results only by: Marland Kitchen MyChart Message (if you have MyChart) OR . A paper copy in the mail If you have any lab test that is abnormal or we need to change your treatment, we will call you to review the results.  Testing/Procedures: Your physician has requested that you have a lexiscan myoview. For further information please visit [email or web address]. Please follow instruction sheet, as given.    Follow-Up: Follow up in 3 months with Dr. Burt Knack or Richardson Dopp PA-C  Any Other Special Instructions Will Be Listed Below (If Applicable).

## 2019-04-01 ENCOUNTER — Telehealth: Payer: Medicare Other | Admitting: Physician Assistant

## 2019-04-07 ENCOUNTER — Telehealth (HOSPITAL_COMMUNITY): Payer: Self-pay

## 2019-04-07 NOTE — Telephone Encounter (Signed)
Attempted to contact the patient to give instructions to her for her stress test. Her voicemail is not set up. I will attempt to reach her at another time. S.Norvella Loscalzo EMTP

## 2019-04-12 ENCOUNTER — Encounter (HOSPITAL_COMMUNITY): Payer: Medicare Other

## 2019-04-21 ENCOUNTER — Telehealth (HOSPITAL_COMMUNITY): Payer: Self-pay

## 2019-04-21 NOTE — Telephone Encounter (Signed)
Attempted to contact the patient, left instructions on th e patient's answering machine. Asked to call back with any questions. S.Tomorrow Dehaas EMTP

## 2019-04-26 ENCOUNTER — Encounter (INDEPENDENT_AMBULATORY_CARE_PROVIDER_SITE_OTHER): Payer: Self-pay

## 2019-04-26 ENCOUNTER — Encounter (HOSPITAL_COMMUNITY): Payer: Self-pay | Admitting: *Deleted

## 2019-04-26 ENCOUNTER — Ambulatory Visit (HOSPITAL_COMMUNITY): Payer: Medicare Other | Attending: Cardiovascular Disease

## 2019-04-26 ENCOUNTER — Other Ambulatory Visit: Payer: Self-pay

## 2019-04-26 DIAGNOSIS — R079 Chest pain, unspecified: Secondary | ICD-10-CM | POA: Insufficient documentation

## 2019-04-26 MED ORDER — TECHNETIUM TC 99M TETROFOSMIN IV KIT
32.9000 | PACK | Freq: Once | INTRAVENOUS | Status: AC | PRN
  Administered 2019-04-26: 32.9 via INTRAVENOUS
  Filled 2019-04-26: qty 33

## 2019-04-28 ENCOUNTER — Other Ambulatory Visit: Payer: Self-pay

## 2019-04-28 ENCOUNTER — Ambulatory Visit (HOSPITAL_COMMUNITY): Payer: Medicare Other | Attending: Cardiology

## 2019-04-28 DIAGNOSIS — R079 Chest pain, unspecified: Secondary | ICD-10-CM | POA: Diagnosis not present

## 2019-04-28 LAB — MYOCARDIAL PERFUSION IMAGING
LV dias vol: 119 mL (ref 46–106)
LV sys vol: 86 mL
Peak HR: 100 {beats}/min
Rest HR: 75 {beats}/min
SDS: 1
SRS: 25
SSS: 26
TID: 0.83

## 2019-04-28 MED ORDER — REGADENOSON 0.4 MG/5ML IV SOLN
0.4000 mg | Freq: Once | INTRAVENOUS | Status: AC
  Administered 2019-04-28: 0.4 mg via INTRAVENOUS

## 2019-04-28 MED ORDER — TECHNETIUM TC 99M TETROFOSMIN IV KIT
32.7000 | PACK | Freq: Once | INTRAVENOUS | Status: AC | PRN
  Administered 2019-04-28: 32.7 via INTRAVENOUS
  Filled 2019-04-28: qty 33

## 2019-04-29 ENCOUNTER — Encounter: Payer: Self-pay | Admitting: Physician Assistant

## 2019-04-29 ENCOUNTER — Telehealth: Payer: Self-pay

## 2019-04-29 NOTE — Telephone Encounter (Signed)
Unable to LM pt has no voicemail.

## 2019-04-29 NOTE — Telephone Encounter (Signed)
-----   Message from Liliane Shi, [place] sent at 04/29/2019 12:00 PM EDT ----- Please call the patient. The stress test shows the prior heart attack but no new findings to suggest another blockage.  I reviewed her study with Dr. Burt Knack who agreed.  PLAN:   - Continue current medications and follow up as planned.   - Send Copy to PCP Richardson Dopp, PA-C    04/29/2019 11:58 AM

## 2019-05-02 NOTE — Telephone Encounter (Signed)
° ° °  Please return call with stress test results

## 2019-05-03 NOTE — Telephone Encounter (Signed)
Pt advised her stress test results.

## 2019-05-04 ENCOUNTER — Encounter: Payer: Self-pay | Admitting: *Deleted

## 2019-05-04 DIAGNOSIS — Z006 Encounter for examination for normal comparison and control in clinical research program: Secondary | ICD-10-CM

## 2019-05-10 ENCOUNTER — Other Ambulatory Visit: Payer: Self-pay | Admitting: Gastroenterology

## 2019-05-10 ENCOUNTER — Other Ambulatory Visit: Payer: Self-pay | Admitting: Physician Assistant

## 2019-05-10 ENCOUNTER — Other Ambulatory Visit: Payer: Self-pay | Admitting: Family Medicine

## 2019-05-10 DIAGNOSIS — K219 Gastro-esophageal reflux disease without esophagitis: Secondary | ICD-10-CM

## 2019-05-10 NOTE — Telephone Encounter (Signed)
Requested Prescriptions  Pending Prescriptions Disp Refills  . amitriptyline (ELAVIL) 50 MG tablet [Pharmacy Med Name: AMITRIPTYLINE HCL 50 MG TAB 50 Tablet] 90 tablet 1    Sig: TAKE 1 TABLET BY MOUTH AT BEDTIME.     Psychiatry:  Antidepressants - Heterocyclics (TCAs) Failed - 05/10/2019  4:49 PM      Failed - Completed PHQ-2 or PHQ-9 in the last 360 days.      Passed - Valid encounter within last 6 months    Recent Outpatient Visits          2 months ago Upper back pain on left side   [facility], Lilia Argue, MD   3 months ago Medicare annual wellness visit, subsequent   [facility], Lilia Argue, MD   5 months ago Dysuria   [facility], Lilia Argue, MD   10 months ago Skin lesion of face   [facility], Lilia Argue, MD   1 year ago Shortness of breath   [facility] Care at [facility], Gelene Mink, PA-C      Future Appointments            In 1 month Kathlen Mody, Nicki Reaper T, PA-C [place], [facility]   In 4 months [place], Lilia Argue, MD Primary Care at [place], [facility]

## 2019-05-16 ENCOUNTER — Ambulatory Visit (INDEPENDENT_AMBULATORY_CARE_PROVIDER_SITE_OTHER): Payer: Medicare Other | Admitting: *Deleted

## 2019-05-16 DIAGNOSIS — I5022 Chronic systolic (congestive) heart failure: Secondary | ICD-10-CM

## 2019-05-16 DIAGNOSIS — I255 Ischemic cardiomyopathy: Secondary | ICD-10-CM

## 2019-05-17 LAB — CUP PACEART REMOTE DEVICE CHECK
Battery Remaining Longevity: 37 mo
Battery Remaining Percentage: 33 %
Battery Voltage: 2.89 V
Brady Statistic RV Percent Paced: 1 %
Date Time Interrogation Session: 20201020070308
HighPow Impedance: 100 Ohm
HighPow Impedance: 100 Ohm
Implantable Lead Implant Date: 20130108
Implantable Lead Location: 753860
Implantable Pulse Generator Implant Date: 20130108
Lead Channel Impedance Value: 540 Ohm
Lead Channel Pacing Threshold Amplitude: 0.75 V
Lead Channel Pacing Threshold Pulse Width: 0.5 ms
Lead Channel Sensing Intrinsic Amplitude: 12 mV
Lead Channel Setting Pacing Amplitude: 2.5 V
Lead Channel Setting Pacing Pulse Width: 0.5 ms
Lead Channel Setting Sensing Sensitivity: 0.5 mV
Pulse Gen Serial Number: 1016523

## 2019-06-03 NOTE — Progress Notes (Signed)
Remote ICD transmission.   

## 2019-06-06 DIAGNOSIS — R300 Dysuria: Secondary | ICD-10-CM | POA: Diagnosis not present

## 2019-06-07 ENCOUNTER — Other Ambulatory Visit: Payer: Self-pay | Admitting: Surgery

## 2019-06-07 DIAGNOSIS — I712 Thoracic aortic aneurysm, without rupture, unspecified: Secondary | ICD-10-CM

## 2019-06-10 ENCOUNTER — Other Ambulatory Visit: Payer: Self-pay | Admitting: Family Medicine

## 2019-06-10 ENCOUNTER — Other Ambulatory Visit: Payer: Self-pay | Admitting: Gastroenterology

## 2019-06-10 NOTE — Telephone Encounter (Signed)
Requested medication (s) are due for refill today: yes  Requested medication (s) are on the active medication list: yes  Last refill:  05/10/2019  Future visit scheduled: yes  Notes to clinic:  Review for refill   Requested Prescriptions  Pending Prescriptions Disp Refills   PROAIR HFA 108 (90 Base) MCG/ACT inhaler [Pharmacy Med Name: PROAIR HFA 90 MCG INHALER 108 (90 BAS Aerosol] 8.5 g 4    Sig: INHALE 2 PUFFS INTO THE LUNGS EVERY 6 HOURS AS NEEDED. FOR SHORTNESS OF BREATH     Pulmonology:  Beta Agonists Failed - 06/10/2019  9:47 AM      Failed - One inhaler should last at least one month. If the patient is requesting refills earlier, contact the patient to check for uncontrolled symptoms.      Passed - Valid encounter within last 12 months    Recent Outpatient Visits          3 months ago Upper back pain on left side   [facility], Lilia Argue, MD   4 months ago Medicare annual wellness visit, subsequent   [facility], Lilia Argue, MD   6 months ago Dysuria   [facility], Lilia Argue, MD   11 months ago Skin lesion of face   [facility], Lilia Argue, MD   1 year ago Shortness of breath   Primary Care at [facility], Gelene Mink, PA-C      Future Appointments            In 3 weeks Richardson Dopp T, PA-C [place], [facility]   In 3 months [place], Lilia Argue, MD Primary Care at [place], [facility]

## 2019-07-03 NOTE — Progress Notes (Signed)
Cardiology Office Note:    Date:  07/04/2019   ID:  [name], DOB 1945-11-21, MRN [id number]  PCP:  Rutherford Guys, MD  Cardiologist:  [name], MD  Electrophysiologist:  Thompson Grayer, MD   Referring MD: Rutherford Guys, MD   Chief Complaint  Patient presents with  . Follow-up    CAD, CHF    History of Present Illness:    [name] is a 73 y.o. female with:   Coronary artery disease  ? S/p anterior MI in 2010 >> PCI: BMS to prox LAD ? S/p multiple PCIs since MI in 2010 2/2 ISR ? Myoview 04/2019: Lg scar, no ischemia, EF 28  Chronic systolic CHF ? Ischemic CM  S/p AICD  Hx of pulmonary embolism   COPD  Chronic kidney disease   Prior Gi bleeding   Hypertension   Hyperlipidemia   Thoracic aortic aneurysm ? S/p L carotid-subclavian transposition and endovascular repair of aneurysm 03/2018   She was last seen in 03/2019 via Telemedicine.  She complained of chest pain and a Myoview was obtained.  This demonstrated a large scar but no ischemia.  I reviewed this with Dr. Burt Knack who agreed there were no new findings.    She returns for follow up.  She is here alone.  She has not had further chest pain.  She has not had significant shortness of breath.  However, she does not fatigue with moderate activities.  She has not had syncope, orthopnea, leg swelling.  She has not had ICD discharges.     Prior CV studies:   The following studies were reviewed today:  Myoview 04/28/2019  Nuclear stress EF: 28%.  There was no ST segment deviation noted during stress.  No T wave inversion was noted during stress.  Defect 1: There is a large defect of severe severity present in the mid anterior, mid anteroseptal, mid inferoseptal, apical anterior, apical septal, apical inferior, apical lateral and apex location.  Findings consistent with prior myocardial infarction.  The left ventricular ejection fraction is severely decreased (<30%).  This is a high  risk study.  Compared to prior study, scar was present prior but cannot compare side by side images.   Very large area of no perfusion with bordering reduced perfusion across apex and in the bordering anterior, anteroseptal, and inferoseptal walls. No reversibility. Reduced EF with diffuse wall motion abnormalities   Chest/Abd CTA 04/26/18 IMPRESSION: 1. Good appearance of stent graft extending from the distal arch into the proximal descending thoracic segment, with exclusion of saccular aneurysm, no endoleak. 2. Coverage and occlusion of left subclavian artery origin, with patent left carotid subclavian bypass. 3. Bilateral renal artery ostial stenoses of probable hemodynamic significance. 4. Aortoiliac atherosclerosis (ICD10-170.0).  ABI 03/15/18 Final Interpretation: Right: Resting right ankle-brachial index is within normal range. No evidence of significant right lower extremity arterial disease. The right toe-brachial index is abnormal. RT great toe pressure = 75 mmHg. Left: Resting left ankle-brachial index is within normal range. No evidence of significant left lower extremity arterial disease. The left toe-brachial index is normal. LT Great toe pressure = 99 mmHg.  Limited Echo 10/22/16 EF 25-30, apical and inf HK  Echo 09/01/16 EF 25-30, ant-sept and apical AK, Gr 1 DD, mild LAE  Carotid US 09/08/16 bilat 1-39  LHC (06/2010):  prox LAD stent ok with 30-50% ISR, prox to mid RCA 30-40%.  Echo (04/2011):  Septal apical and ant HK, cannot r/o mural apical clot, EF  25%, mild LAE.  Nuclear (07/2013):  Large scar involving apex and apical ant and apical septal segments, no ischemia, EF 25%; High Risk   Past Medical History:  Diagnosis Date  . Abdominal pain, left lower quadrant 08/14/2009   Qualifier: Diagnosis of  By: Laney Potash, Pam    . Abnormal CT scan, stomach   . Abnormal LFTs 06/19/2017  . AICD (automatic cardioverter/defibrillator) present    Dr.Allred  follows  . Aortic arch pseudoaneurysm (HCC)    a. followed by Dr. Prescott Gum.  . Arthritis   . Asthma   . Benign neoplasm of colon   . CAD (coronary artery disease) 02/2009   a. anterior STEMI rx with BMS to prox LAD in 02/2009. b. ISR s/p PTCA 06/2009. c. ISR s/p thrombectomy & PTCA 03/2010 due to late stent thrombosis. // Myoview 04/2019: EF 28, ant, ant-sept, inf-sept scar, no ischemia, high risk (stable>>cont med Rx)   . Cardiomyopathy, ischemic 06/12/2011  . Chronic renal insufficiency    stage 3  . Chronic systolic CHF (congestive heart failure) ([place])    a. s/p ST. Jude ICD 10/22/11.  Marland Kitchen Chronic systolic heart failure ([place]) 08/05/2011  . Colon polyp   . Complication of anesthesia   . COPD (chronic obstructive pulmonary disease) ([place])   . CORONARY ARTERY DISEASE 04/24/2009   Qualifier: Diagnosis of  By: Trellis Paganini PA-c, Amy S   . DDD (degenerative disc disease), lumbar 05/16/2011  . Depression    "since my son died in Nov 22, 2022" 2017-10-21)  . Diverticulosis   . DIVERTICULOSIS-COLON 08/14/2009   Qualifier: Diagnosis of  By: Trellis Paganini PA-c, Amy S   . DYSPHAGIA UNSPECIFIED 04/24/2009   Qualifier: Diagnosis of  By: Laney Potash, Pam    . Dyspnea    "when I lay down at night"   . Essential hypertension 09/05/2009   Qualifier: Diagnosis of  By: Harvest Dark CMA, Anderson Malta    . Gastric polyp   . GERD 10/19/2007   Qualifier: Diagnosis of  By: Rennie Plowman RN, Blanca Friend: Diagnosis of  By: Laney Potash, Pam    . GERD (gastroesophageal reflux disease)   . GI bleed    a. h/o GIB on DAPT, now on ASA only.  Marland Kitchen Headache 01/03/2013  . Hearing loss    left ear  . History of pulmonary embolus (PE) 01/02/2011  . Hyperlipemia   . Hyperlipidemia, unspecified 04/24/2009   Qualifier: Diagnosis of  By: Trellis Paganini PA-c, Amy S   . Hypertension   . ICD-St.Jude 08/06/2011   S/p St. Jude ICD placement 08/05/11   . Insomnia   . Irritable bowel syndrome   . Ischemic cardiomyopathy    EF 10-15%  . Memory loss   . Migraine  headache without aura   . Myocardial infarct [facility]) 21-Oct-2009 x 2   Dr. Roswell Nickel   . PERSONAL HX COLONIC POLYPS 04/24/2009   Qualifier: Diagnosis of  By: Shane Crutch, Amy S November, 2011 colonoscopy demonstrated a sessile cecal polyp and sigmoid polyp   . Pneumonia   . PONV (postoperative nausea and vomiting)   . Pre-diabetes   . Pseudoaneurysm of aortic arch ([place]) 05/05/2012  . Pulmonary embolism ([place]) 10-21-2009  . Stroke [facility])    'When I was young." no residual  . Thoracic aortic aneurysm ([place]) 03/31/2018  . TOBACCO ABUSE 04/04/2009   Qualifier: Diagnosis of  By: Arvid Right  Qualifier: Diagnosis of  By: [name], MD, Jaquelyn Bitter   . Transfusion history    ?'  12 or '13  . UNSPECIFIED ANEMIA 07/03/2010   Qualifier: Diagnosis of  By: Shelda Pal    . Unspecified mastoiditis    Surgical Hx: The patient  has a past surgical history that includes Bilateral salpingoophorectomy; Inner ear surgery; Cervical spine surgery (08/08); Lumbar disc surgery (02/2008); CAD( bare metal stent) (02/2009); Angioplasty (07/02/09, 04/01/10); implantable cardioverter defibrillator implant (N/A, 08/05/2011); Esophagogastroduodenoscopy (egd) with propofol (N/A, 04/29/2016); Colonoscopy with propofol (N/A, 04/29/2016); Total abdominal hysterectomy; EUS (N/A, 05/15/2016); Esophagogastroduodenoscopy (N/A, 12/09/2016); Nasal septum surgery; Thoracic aortic endovascular stent graft (N/A, 03/31/2018); and Carotid-subclavian Bypass Graft (Left, 03/31/2018).   Current Medications: Current Meds  Medication Sig  . amitriptyline (ELAVIL) 50 MG tablet TAKE 1 TABLET BY MOUTH AT BEDTIME.  Marland Kitchen aspirin 81 MG tablet Take 81 mg by mouth every morning.   . bisoprolol (ZEBETA) 5 MG tablet TAKE 1 TABLET BY MOUTH DAILY.  Marland Kitchen DEXILANT 60 MG capsule TAKE 1 CAPSULE BY MOUTH DAILY.  . famotidine (PEPCID) 20 MG tablet TAKE 1 TABLET BY MOUTH 2 TIMES DAILY.  . furosemide (LASIX) 20 MG tablet Take 2 tablets (40 mg total) by mouth  daily.  . nitroGLYCERIN (NITROSTAT) 0.4 MG SL tablet Place 1 tablet (0.4 mg total) under the tongue every 5 (five) minutes as needed for chest pain.  . polyethylene glycol powder (GLYCOLAX/MIRALAX) powder Take 17 g by mouth daily as needed for mild constipation.  Marland Kitchen PREMARIN vaginal cream   . PROAIR HFA 108 (90 Base) MCG/ACT inhaler INHALE 2 PUFFS INTO THE LUNGS EVERY 6 HOURS AS NEEDED. FOR SHORTNESS OF BREATH  . rosuvastatin (CRESTOR) 20 MG tablet TAKE 1 TABLET BY MOUTH AT BEDTIME.  . sacubitril-valsartan (ENTRESTO) 49-51 MG Take 1 tablet by mouth 2 (two) times daily.  Marland Kitchen topiramate (TOPAMAX) 50 MG tablet Take 1 tablet (50 mg total) by mouth daily.     Allergies:   Lisinopril, Sulfa antibiotics, and Sulfonamide derivatives   Social History   Tobacco Use  . Smoking status: Current Some Day Smoker    Packs/day: 0.13    Years: 30.00    Pack years: 3.90    Types: Cigarettes  . Smokeless tobacco: Never Used  . Tobacco comment: Restarted smoking.  few cig. daily  Substance Use Topics  . Alcohol use: No    Alcohol/week: 0.0 standard drinks  . Drug use: No     Family Hx: The patient's family history includes Bladder Cancer in her brother; Breast cancer in her cousin; Cancer in her son; Colon cancer in her brother and mother.  ROS:   Please see the history of present illness.    ROS All other systems reviewed and are negative.   EKGs/Labs/Other Test Reviewed:    EKG:  EKG is  ordered today.  The ekg ordered today demonstrates normal sinus rhythm, heart rate 86, left axis deviation, interventricular conduction delay, incomplete right bundle branch block, QTC 473, no significant change since prior tracing  Recent Labs: 03/10/2019: ALT 11; BUN 25; Creatinine, Ser 1.58; Potassium 5.0; Sodium 139; TSH 4.270   Recent Lipid Panel Lab Results  Component Value Date/Time   CHOL 149 03/10/2019 11:58 AM   TRIG 129 03/10/2019 11:58 AM   HDL 53 03/10/2019 11:58 AM   CHOLHDL 2.8 03/10/2019  11:58 AM   CHOLHDL 2.7 01/15/2016 01:45 PM   LDLCALC 70 03/10/2019 11:58 AM    Physical Exam:    VS:  BP 118/70   Pulse 86   Ht 5\' 2"  (1.575 m)   Wt 175 lb  3.2 oz (79.5 kg)   SpO2 99%   BMI 32.04 kg/m     Wt Readings from Last 3 Encounters:  07/04/19 175 lb 3.2 oz (79.5 kg)  03/29/19 174 lb (78.9 kg)  03/10/19 174 lb (78.9 kg)     Physical Exam  Constitutional: She is oriented to person, place, and time. She appears well-developed and well-nourished. No distress.  HENT:  Head: Normocephalic and atraumatic.  Eyes: No scleral icterus.  Neck: No JVD present. No thyromegaly present.  Cardiovascular: Normal rate, regular rhythm and normal heart sounds.  No murmur heard. Pulmonary/Chest: Effort normal and breath sounds normal. She has no rales.  Abdominal: Soft. There is no hepatomegaly.  Musculoskeletal:        General: No edema.  Lymphadenopathy:    She has no cervical adenopathy.  Neurological: She is alert and oriented to person, place, and time.  Skin: Skin is warm and dry.  Psychiatric: She has a normal mood and affect.    ASSESSMENT & PLAN:    1. Coronary artery disease involving native coronary artery of native heart with angina pectoris ([place]) History of myocardial infarction in 2010 treated with bare-metal stent to the LAD.  She had multiple PCI procedures since that time secondary to in-stent restenosis.  Recent Myoview demonstrated a large scar but no ischemia.  Medical therapy has been continued.  She is doing well without recurrent anginal symptoms.  Continue aspirin, bisoprolol, rosuvastatin.  2. Chronic systolic CHF (congestive heart failure) (HCC) EF 25-30.  NYHA 2b.  Overall, volume status seems to be stable.  Continue current dose of bisoprolol, Entresto.  Obtain BMET today.  If renal function and potassium stable enough to tolerate MRA, I will consider starting low dose Spironolactone.   3. Essential hypertension The patient's blood pressure is controlled  on her current regimen.  Continue current therapy.    4. Thoracic aortic aneurysm without rupture ([place]) S/p repair.  She has follow up with Dr. Trula Slade next week.   5. Stage 3b chronic kidney disease Obtain repeat BMET today.     Dispo:  Return in about 6 months (around 01/02/2020) for Routine Follow Up, w/ Dr. Burt Knack, or Richardson Dopp, PA-C, (virtual or in-person).   Medication Adjustments/Labs and Tests Ordered: Current medicines are reviewed at length with the patient today.  Concerns regarding medicines are outlined above.  Tests Ordered: Orders Placed This Encounter  Procedures  . Basic Metabolic Panel (BMET)  . EKG 12-Lead   Medication Changes: No orders of the defined types were placed in this encounter.   Signed, Richardson Dopp, PA-C  07/04/2019 1:02 PM    [facility] [place], [place], [place]  [address] Phone: [phone]; Fax: [phone]

## 2019-07-04 ENCOUNTER — Other Ambulatory Visit: Payer: Self-pay

## 2019-07-04 ENCOUNTER — Encounter: Payer: Self-pay | Admitting: Physician Assistant

## 2019-07-04 ENCOUNTER — Ambulatory Visit (INDEPENDENT_AMBULATORY_CARE_PROVIDER_SITE_OTHER): Payer: Medicare Other | Admitting: Physician Assistant

## 2019-07-04 VITALS — BP 118/70 | HR 86 | Ht 62.0 in | Wt 175.2 lb

## 2019-07-04 DIAGNOSIS — N1832 Chronic kidney disease, stage 3b: Secondary | ICD-10-CM

## 2019-07-04 DIAGNOSIS — I712 Thoracic aortic aneurysm, without rupture, unspecified: Secondary | ICD-10-CM

## 2019-07-04 DIAGNOSIS — I5022 Chronic systolic (congestive) heart failure: Secondary | ICD-10-CM | POA: Diagnosis not present

## 2019-07-04 DIAGNOSIS — I10 Essential (primary) hypertension: Secondary | ICD-10-CM

## 2019-07-04 DIAGNOSIS — I25119 Atherosclerotic heart disease of native coronary artery with unspecified angina pectoris: Secondary | ICD-10-CM | POA: Diagnosis not present

## 2019-07-04 NOTE — Patient Instructions (Signed)
Medication Instructions:   Your physician recommends that you continue on your current medications as directed. Please refer to the Current Medication list given to you today.  *If you need a refill on your cardiac medications before your next appointment, please call your pharmacy*  Lab Work:  You will have labs drawn today: BMET  If you have labs (blood work) drawn today and your tests are completely normal, you will receive your results only by: Marland Kitchen MyChart Message (if you have MyChart) OR . A paper copy in the mail If you have any lab test that is abnormal or we need to change your treatment, we will call you to review the results.  Testing/Procedures:  None ordered today  Follow-Up: At [facility], you and your health needs are our priority.  As part of our continuing mission to provide you with exceptional heart care, we have created designated Provider Care Teams.  These Care Teams include your primary Cardiologist (physician) and Advanced Practice Providers (APPs -  Physician Assistants and Nurse Practitioners) who all work together to provide you with the care you need, when you need it.  Your next appointment:   6 month(s)  The format for your next appointment:   Either In Person or Virtual  Provider:   You may see [name], MD or one of the following Advanced Practice Providers on your designated Care Team:    Richardson Dopp, PA-C  Vin [place], [place]  Daune Perch, [place]

## 2019-07-05 ENCOUNTER — Encounter: Payer: Self-pay | Admitting: *Deleted

## 2019-07-05 LAB — BASIC METABOLIC PANEL
BUN/Creatinine Ratio: 18 (ref 12–28)
BUN: 32 mg/dL — ABNORMAL HIGH (ref 8–27)
CO2: 22 mmol/L (ref 20–29)
Calcium: 9.5 mg/dL (ref 8.7–10.3)
Chloride: 110 mmol/L — ABNORMAL HIGH (ref 96–106)
Creatinine, Ser: 1.73 mg/dL — ABNORMAL HIGH (ref 0.57–1.00)
GFR calc Af Amer: 34 mL/min/{1.73_m2} — ABNORMAL LOW (ref >59)
GFR calc non Af Amer: 29 mL/min/{1.73_m2} — ABNORMAL LOW (ref >59)
Glucose: 97 mg/dL (ref 65–99)
Potassium: 4.6 mmol/L (ref 3.5–5.2)
Sodium: 145 mmol/L — ABNORMAL HIGH (ref 134–144)

## 2019-07-05 NOTE — Progress Notes (Unsigned)
Confirmed via phone call with Dr. Kathaleen Bury chest without contrast due to renal function labs. Called to [place] at [facility].

## 2019-07-06 ENCOUNTER — Ambulatory Visit
Admission: RE | Admit: 2019-07-06 | Discharge: 2019-07-06 | Disposition: A | Discharge status: Home / Self Care | Payer: Medicare Other | Source: Ambulatory Visit | Attending: Surgery | Admitting: Surgery

## 2019-07-06 DIAGNOSIS — I712 Thoracic aortic aneurysm, without rupture, unspecified: Secondary | ICD-10-CM

## 2019-07-11 ENCOUNTER — Ambulatory Visit (INDEPENDENT_AMBULATORY_CARE_PROVIDER_SITE_OTHER): Payer: Medicare Other | Admitting: Surgery

## 2019-07-11 ENCOUNTER — Other Ambulatory Visit: Payer: Self-pay

## 2019-07-11 ENCOUNTER — Encounter: Payer: Self-pay | Admitting: Surgery

## 2019-07-11 VITALS — BP 136/89 | HR 125 | Resp 16 | Ht 62.0 in | Wt 175.0 lb

## 2019-07-11 DIAGNOSIS — I711 Thoracic aortic aneurysm, ruptured, unspecified: Secondary | ICD-10-CM

## 2019-07-11 NOTE — Progress Notes (Signed)
[facility] [place]  Patient name: [name] MRN: [id number] DOB: [birth year] Sex: female   REASON FOR VISIT:    Follow up  HISOTRY OF PRESENT ILLNESS:     [name] is a 73 y.o. 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  She underwent left subclavian to left carotid artery transposition and endovascular repair of her thoracic aortic ulcer with aneurysmal degeneration on 03/31/2018.  Her postoperative course was uncomplicated and she was discharged home on postoperative day 1.  She has had some intermittent back pain since I last saw her but she also has underlying musculoskeletal issues.  The symptoms fortunately have resolved.  She did not seek medical assistance.  She continues to take a statin for hypercholesterolemia.  She continues to smoke.  PAST MEDICAL HISTORY:   Past Medical History:  Diagnosis Date  . Abdominal pain, left lower quadrant 08/14/2009   Qualifier: Diagnosis of  By: Laney Potash, Pam    . Abnormal CT scan, stomach   . Abnormal LFTs 06/19/2017  . AICD (automatic cardioverter/defibrillator) present    Dr.Allred follows  . Aortic arch pseudoaneurysm (HCC)    a. followed by Dr. Prescott Gum.  . Arthritis   . Asthma   . Benign neoplasm of colon   . CAD (coronary artery disease) 02/2009   a. anterior STEMI rx with BMS to prox LAD in 02/2009. b. ISR s/p PTCA 06/2009. c. ISR s/p thrombectomy & PTCA 03/2010 due to late stent thrombosis. // Myoview 04/2019: EF 28, ant, ant-sept, inf-sept scar, no ischemia, high risk (stable>>cont med Rx)   . Cardiomyopathy, ischemic 06/12/2011  . Chronic renal insufficiency    stage 3  . Chronic systolic CHF (congestive heart failure) ([place])    a. s/p ST. Jude ICD 11-09-2011.  Marland Kitchen Chronic systolic heart failure ([place]) 08/05/2011  . Colon polyp   . Complication of anesthesia   . COPD (chronic obstructive pulmonary disease)  ([place])   . CORONARY ARTERY DISEASE 04/24/2009   Qualifier: Diagnosis of  By: Trellis Paganini PA-c, Amy S   . DDD (degenerative disc disease), lumbar 05/16/2011  . Depression    "since my son died in Dec 10, 2022" 08-Nov-2017)  . Diverticulosis   . DIVERTICULOSIS-COLON 08/14/2009   Qualifier: Diagnosis of  By: Trellis Paganini PA-c, Amy S   . DYSPHAGIA UNSPECIFIED 04/24/2009   Qualifier: Diagnosis of  By: Laney Potash, Pam    . Dyspnea    "when I lay down at night"   . Essential hypertension 09/05/2009   Qualifier: Diagnosis of  By: Harvest Dark CMA, Anderson Malta    . Gastric polyp   . GERD 10/19/2007   Qualifier: Diagnosis of  By: Rennie Plowman RN, Blanca Friend: Diagnosis of  By: Laney Potash, Pam    . GERD (gastroesophageal reflux disease)   . GI bleed    a. h/o GIB on DAPT, now on ASA only.  Marland Kitchen Headache 01/03/2013  . Hearing loss    left ear  . History of pulmonary embolus (PE) 01/02/2011  . Hyperlipemia   . Hyperlipidemia, unspecified 04/24/2009   Qualifier: Diagnosis of  By: Trellis Paganini PA-c, Amy S   . Hypertension   . ICD-St.Jude 08/06/2011   S/p St. Jude ICD placement 08/05/11   . Insomnia   . Irritable bowel syndrome   . Ischemic cardiomyopathy    EF 10-15%  . Memory loss   . Migraine headache without aura   . Myocardial infarct ([place]) November 08, 2009 x  2   Dr. Roswell Nickel   . PERSONAL HX COLONIC POLYPS 04/24/2009   Qualifier: Diagnosis of  By: Shane Crutch, Amy S November, 2011 colonoscopy demonstrated a sessile cecal polyp and sigmoid polyp   . Pneumonia   . PONV (postoperative nausea and vomiting)   . Pre-diabetes   . Pseudoaneurysm of aortic arch ([place]) 05/05/2012  . Pulmonary embolism ([place]) 2011  . Stroke [facility])    'When I was young." no residual  . Thoracic aortic aneurysm ([place]) 03/31/2018  . TOBACCO ABUSE 04/04/2009   Qualifier: Diagnosis of  By: Arvid Right  Qualifier: Diagnosis of  By: [name], MD, Jaquelyn Bitter   . Transfusion history    ?'12 or '13  . UNSPECIFIED ANEMIA 07/03/2010    Qualifier: Diagnosis of  By: Shelda Pal    . Unspecified mastoiditis      FAMILY HISTORY:   Family History  Problem Relation Age of Onset  . Colon cancer Mother   . Colon cancer Brother   . Bladder Cancer Brother   . Breast cancer Cousin   . Cancer Son     SOCIAL HISTORY:   Social History   Tobacco Use  . Smoking status: Current Some Day Smoker    Packs/day: 0.13    Years: 30.00    Pack years: 3.90    Types: Cigarettes  . Smokeless tobacco: Never Used  . Tobacco comment: Restarted smoking.  few cig. daily  Substance Use Topics  . Alcohol use: No    Alcohol/week: 0.0 standard drinks     ALLERGIES:   Allergies  Allergen Reactions  . Lisinopril     cough  . Sulfa Antibiotics Other (See Comments)    Unknown, childhood allergy   . Sulfonamide Derivatives Other (See Comments)    UNSURE     CURRENT MEDICATIONS:   Current Outpatient Medications  Medication Sig Dispense Refill  . amitriptyline (ELAVIL) 50 MG tablet TAKE 1 TABLET BY MOUTH AT BEDTIME. 90 tablet 1  . aspirin 81 MG tablet Take 81 mg by mouth every morning.     . bisoprolol (ZEBETA) 5 MG tablet TAKE 1 TABLET BY MOUTH DAILY. 90 tablet 3  . DEXILANT 60 MG capsule TAKE 1 CAPSULE BY MOUTH DAILY. 30 capsule 5  . famotidine (PEPCID) 20 MG tablet TAKE 1 TABLET BY MOUTH 2 TIMES DAILY. 60 tablet 3  . furosemide (LASIX) 20 MG tablet Take 2 tablets (40 mg total) by mouth daily. 180 tablet 3  . nitroGLYCERIN (NITROSTAT) 0.4 MG SL tablet Place 1 tablet (0.4 mg total) under the tongue every 5 (five) minutes as needed for chest pain. 25 tablet 11  . polyethylene glycol powder (GLYCOLAX/MIRALAX) powder Take 17 g by mouth daily as needed for mild constipation. 255 g 11  . PREMARIN vaginal cream   12  . PROAIR HFA 108 (90 Base) MCG/ACT inhaler INHALE 2 PUFFS INTO THE LUNGS EVERY 6 HOURS AS NEEDED. FOR SHORTNESS OF BREATH 8.5 g 4  . rosuvastatin (CRESTOR) 20 MG tablet TAKE 1 TABLET BY MOUTH AT BEDTIME. 90 tablet 3  .  sacubitril-valsartan (ENTRESTO) 49-51 MG Take 1 tablet by mouth 2 (two) times daily. 60 tablet 11  . topiramate (TOPAMAX) 50 MG tablet Take 1 tablet (50 mg total) by mouth daily. 90 tablet 3   No current facility-administered medications for this visit.    REVIEW OF SYSTEMS:   [X]  denotes positive finding, [ ]  denotes negative finding Cardiac  Comments:  Chest pain or chest  pressure:    Shortness of breath upon exertion:    Short of breath when lying flat:    Irregular heart rhythm:        Vascular    Pain in calf, thigh, or hip brought on by ambulation:    Pain in feet at night that wakes you up from your sleep:     Blood clot in your veins:    Leg swelling:         Pulmonary    Oxygen at home:    Productive cough:     Wheezing:         Neurologic    Sudden weakness in arms or legs:     Sudden numbness in arms or legs:     Sudden onset of difficulty speaking or slurred speech:    Temporary loss of vision in one eye:     Problems with dizziness:         Gastrointestinal    Blood in stool:     Vomited blood:         Genitourinary    Burning when urinating:     Blood in urine:        Psychiatric    Major depression:         Hematologic    Bleeding problems:    Problems with blood clotting too easily:        Skin    Rashes or ulcers:        Constitutional    Fever or chills:      PHYSICAL EXAM:   Vitals:   07/11/19 1338  BP: 136/89  Pulse: (!) 125  Resp: 16  SpO2: 96%  Weight: 79.4 kg  Height: 5\' 2"  (1.575 m)    GENERAL: The patient is a well-nourished female, in no acute distress. The vital signs are documented above. CARDIAC: There is a regular rate and rhythm.  VASCULAR: Palpable radial pulses bilaterally PULMONARY: Non-labored respirations MUSCULOSKELETAL: There are no major deformities or cyanosis. NEUROLOGIC: No focal weakness or paresthesias are detected. SKIN: There are no ulcers or rashes noted. PSYCHIATRIC: The patient has a normal  affect.  STUDIES:   I have reviewed the following: CT chest without  Stable position of stent graft involving transverse aortic arch and proximal descending thoracic aorta. The saccular aneurysm seen arising from transverse aortic arch on prior exam has resolved. No aneurysmal dilatation of thoracic aorta is noted. MEDICAL ISSUES:   Status post endovascular pair of thoracic aortic penetrating ulcer with aneurysmal change via a left carotid subclavian transposition and stent graft.  Although her CT scan was noncontrast because of her renal issues, the area has dramatically resolved.  The stent graft mains in good position.  I have her scheduled for follow-up in 2 years with repeat CT scan and duplex of her carotid subclavian.    Leia Alf, MD, FACS [facility] [phone] Pager [phone]

## 2019-07-20 ENCOUNTER — Ambulatory Visit: Payer: Medicare Other | Attending: Internal Medicine

## 2019-07-20 DIAGNOSIS — Z20822 Contact with and (suspected) exposure to covid-19: Secondary | ICD-10-CM

## 2019-07-21 LAB — NOVEL CORONAVIRUS, NAA: SARS-CoV-2, NAA: NOT DETECTED

## 2019-07-27 ENCOUNTER — Other Ambulatory Visit: Payer: Self-pay | Admitting: *Deleted

## 2019-07-27 DIAGNOSIS — I6523 Occlusion and stenosis of bilateral carotid arteries: Secondary | ICD-10-CM

## 2019-08-08 ENCOUNTER — Ambulatory Visit: Payer: Medicare Other | Admitting: Neurology

## 2019-08-15 ENCOUNTER — Ambulatory Visit (INDEPENDENT_AMBULATORY_CARE_PROVIDER_SITE_OTHER): Payer: Medicare Other | Admitting: *Deleted

## 2019-08-15 DIAGNOSIS — I5022 Chronic systolic (congestive) heart failure: Secondary | ICD-10-CM

## 2019-08-15 LAB — CUP PACEART REMOTE DEVICE CHECK
Battery Remaining Longevity: 36 mo
Battery Remaining Percentage: 31 %
Battery Voltage: 2.87 V
Brady Statistic RV Percent Paced: 1 %
Date Time Interrogation Session: 20210118043830
HighPow Impedance: 83 Ohm
HighPow Impedance: 83 Ohm
Implantable Lead Implant Date: 20130108
Implantable Lead Location: 753860
Implantable Pulse Generator Implant Date: 20130108
Lead Channel Impedance Value: 510 Ohm
Lead Channel Pacing Threshold Amplitude: 0.75 V
Lead Channel Pacing Threshold Pulse Width: 0.5 ms
Lead Channel Sensing Intrinsic Amplitude: 12 mV
Lead Channel Setting Pacing Amplitude: 2.5 V
Lead Channel Setting Pacing Pulse Width: 0.5 ms
Lead Channel Setting Sensing Sensitivity: 0.5 mV
Pulse Gen Serial Number: 1016523

## 2019-08-16 ENCOUNTER — Other Ambulatory Visit: Payer: Self-pay | Admitting: Neurology

## 2019-08-16 ENCOUNTER — Other Ambulatory Visit: Payer: Self-pay

## 2019-08-16 MED ORDER — TOPIRAMATE 50 MG PO TABS: 50.0000 mg | ORAL_TABLET | Freq: Every day | ORAL | 0 refills | Status: DC

## 2019-09-12 ENCOUNTER — Ambulatory Visit (INDEPENDENT_AMBULATORY_CARE_PROVIDER_SITE_OTHER): Payer: Medicare Other | Admitting: Family Medicine

## 2019-09-12 ENCOUNTER — Encounter: Payer: Self-pay | Admitting: Family Medicine

## 2019-09-12 ENCOUNTER — Other Ambulatory Visit: Payer: Self-pay

## 2019-09-12 VITALS — BP 132/86 | HR 88 | Temp 98.1°F | Ht 62.0 in | Wt 173.0 lb

## 2019-09-12 DIAGNOSIS — G43009 Migraine without aura, not intractable, without status migrainosus: Secondary | ICD-10-CM | POA: Diagnosis not present

## 2019-09-12 DIAGNOSIS — E7800 Pure hypercholesterolemia, unspecified: Secondary | ICD-10-CM

## 2019-09-12 DIAGNOSIS — R7303 Prediabetes: Secondary | ICD-10-CM | POA: Diagnosis not present

## 2019-09-12 DIAGNOSIS — I10 Essential (primary) hypertension: Secondary | ICD-10-CM

## 2019-09-12 MED ORDER — AMITRIPTYLINE HCL 50 MG PO TABS: 50.0000 mg | ORAL_TABLET | Freq: Every day | ORAL | 1 refills | Status: DC

## 2019-09-12 NOTE — Progress Notes (Signed)
2/15/20211:43 PM  [name] [birth year], 74 y.o., female [id number]  Chief Complaint  Patient presents with  . Follow-up    patient received 1st covid vaccine 09/09/19    HPI:   Patient is a 74 y.o. female with past medical history significant for CAD, CHF, PAD, HTN, HLP, IBS, GERD, migraines, CKD 3, COPD, grief, prediabeteswho presents today for followup  Last OV Aug 2020 - no changes Last vasc surg appt Dec 2020 - thoracic aorta repair doing really well, fu 2 years Last cards Dec 2020 - overall stable. No changes Cont to see urology, Dr Tresa Moore, interstitial cysititis, use azo as needed, ok to use daily  Overall doing well She has noticed that using a heating pad significant helps her bladder Her migraines are controlled with amitriptyline and topomax Having some scant occ drainage from left ear, no pain or hearing changes Excited because her first great grandchild will be born in aug Has received her first covid vaccine  Lab Results  Component Value Date   HGBA1C 6.0 (H) 03/10/2019   HGBA1C 6.4 (H) 03/26/2018   HGBA1C 5.8 (H) 12/18/2017   Lab Results  Component Value Date   LDLCALC 70 03/10/2019   CREATININE 1.73 (H) 07/04/2019    Depression screen PHQ 2/9 09/12/2019 12/18/2017 11/21/2017  Decreased Interest 0 0 0  Down, Depressed, Hopeless 0 0 0  PHQ - 2 Score 0 0 0  Some recent data might be hidden    Fall Risk  09/12/2019 03/10/2019 08/05/2018 06/16/2018 03/16/2018  Falls in the past year? 0 1 1 0 No  Number falls in past yr: 0 1 1 - -  Injury with Fall? 0 1 0 - -  Risk for fall due to : - History of fall(s);Orthopedic patient Impaired balance/gait - -  Follow up - - - - -     Allergies  Allergen Reactions  . Lisinopril     cough  . Sulfa Antibiotics Other (See Comments)    Unknown, childhood allergy   . Sulfonamide Derivatives Other (See Comments)    UNSURE    Prior to Admission medications   Medication Sig Start Date End Date Taking?  Authorizing Provider  amitriptyline (ELAVIL) 50 MG tablet TAKE 1 TABLET BY MOUTH AT BEDTIME. 05/10/19  Yes Rutherford Guys, MD  aspirin 81 MG tablet Take 81 mg by mouth every morning.    Yes [provider]  bisoprolol (ZEBETA) 5 MG tablet TAKE 1 TABLET BY MOUTH DAILY. 03/29/19  Yes [name], MD  DEXILANT 60 MG capsule TAKE 1 CAPSULE BY MOUTH DAILY. 05/10/19  Yes Armbruster, Carlota Raspberry, MD  famotidine (PEPCID) 20 MG tablet TAKE 1 TABLET BY MOUTH 2 TIMES DAILY. 06/10/19  Yes Armbruster, Carlota Raspberry, MD  furosemide (LASIX) 20 MG tablet Take 2 tablets (40 mg total) by mouth daily. 05/12/19  Yes Weaver, Scott T, PA-C  nitroGLYCERIN (NITROSTAT) 0.4 MG SL tablet Place 1 tablet (0.4 mg total) under the tongue every 5 (five) minutes as needed for chest pain. 03/29/19 03/28/20 Yes Weaver, Scott T, PA-C  polyethylene glycol powder (GLYCOLAX/MIRALAX) powder Take 17 g by mouth daily as needed for mild constipation. 07/08/17  Yes Rutherford Guys, MD  PREMARIN vaginal cream  05/25/18  Yes [provider]  PROAIR HFA 108 (90 Base) MCG/ACT inhaler INHALE 2 PUFFS INTO THE LUNGS EVERY 6 HOURS AS NEEDED. FOR SHORTNESS OF BREATH 06/10/19  Yes Rutherford Guys, MD  rosuvastatin (CRESTOR) 20 MG tablet TAKE 1  TABLET BY MOUTH AT BEDTIME. 03/01/19  Yes [name], MD  sacubitril-valsartan (ENTRESTO) 49-51 MG Take 1 tablet by mouth 2 (two) times daily. 09/29/18 09/24/19 Yes [name], MD  topiramate (TOPAMAX) 50 MG tablet Take 1 tablet (50 mg total) by mouth daily. 08/16/19  Yes Marcial Pacas, MD    Past Medical History:  Diagnosis Date  . Abdominal pain, left lower quadrant 08/14/2009   Qualifier: Diagnosis of  By: Laney Potash, Pam    . Abnormal CT scan, stomach   . Abnormal LFTs 06/19/2017  . AICD (automatic cardioverter/defibrillator) present    Dr.Allred follows  . Aortic arch pseudoaneurysm (HCC)    a. followed by Dr. Prescott Gum.  . Arthritis   . Asthma   . Benign neoplasm of colon   . CAD  (coronary artery disease) 02/2009   a. anterior STEMI rx with BMS to prox LAD in 02/2009. b. ISR s/p PTCA 06/2009. c. ISR s/p thrombectomy & PTCA 03/2010 due to late stent thrombosis. // Myoview 04/2019: EF 28, ant, ant-sept, inf-sept scar, no ischemia, high risk (stable>>cont med Rx)   . Cardiomyopathy, ischemic 06/12/2011  . Chronic renal insufficiency    stage 3  . Chronic systolic CHF (congestive heart failure) ([place])    a. s/p ST. Jude ICD 2011-11-15.  Marland Kitchen Chronic systolic heart failure ([place]) 08/05/2011  . Colon polyp   . Complication of anesthesia   . COPD (chronic obstructive pulmonary disease) ([place])   . CORONARY ARTERY DISEASE 04/24/2009   Qualifier: Diagnosis of  By: Trellis Paganini PA-c, Amy S   . DDD (degenerative disc disease), lumbar 05/16/2011  . Depression    "since my son died in Dec 16, 2022" 11/14/17)  . Diverticulosis   . DIVERTICULOSIS-COLON 08/14/2009   Qualifier: Diagnosis of  By: Trellis Paganini PA-c, Amy S   . DYSPHAGIA UNSPECIFIED 04/24/2009   Qualifier: Diagnosis of  By: Laney Potash, Pam    . Dyspnea    "when I lay down at night"   . Essential hypertension 09/05/2009   Qualifier: Diagnosis of  By: Harvest Dark CMA, Anderson Malta    . Gastric polyp   . GERD 10/19/2007   Qualifier: Diagnosis of  By: Rennie Plowman RN, Blanca Friend: Diagnosis of  By: Laney Potash, Pam    . GERD (gastroesophageal reflux disease)   . GI bleed    a. h/o GIB on DAPT, now on ASA only.  Marland Kitchen Headache 01/03/2013  . Hearing loss    left ear  . History of pulmonary embolus (PE) 01/02/2011  . Hyperlipemia   . Hyperlipidemia, unspecified 04/24/2009   Qualifier: Diagnosis of  By: Trellis Paganini PA-c, Amy S   . Hypertension   . ICD-St.Jude 08/06/2011   S/p St. Jude ICD placement 08/05/11   . Insomnia   . Irritable bowel syndrome   . Ischemic cardiomyopathy    EF 10-15%  . Memory loss   . Migraine headache without aura   . Myocardial infarct [facility]) 14-Nov-2009 x 2   Dr. Ros[place] Nickel   . PERSONAL HX COLONIC POLYPS 04/24/2009   Qualifier:  Diagnosis of  By: Shane Crutch, Amy S November, 2011 colonoscopy demonstrated a sessile cecal polyp and sigmoid polyp   . Pneumonia   . PONV (postoperative nausea and vomiting)   . Pre-diabetes   . Pseudoaneurysm of aortic arch ([place]) 05/05/2012  . Pulmonary embolism ([place]) November 14, 2009  . Stroke [facility]    'When I was young." no residual  . Thoracic aortic aneurysm ([place]) 03/31/2018  . TOBACCO ABUSE 04/04/2009  Qualifier: Diagnosis of  By: Arvid Right  Qualifier: Diagnosis of  By: [name], MD, Jaquelyn Bitter   . Transfusion history    ?'12 or '13  . UNSPECIFIED ANEMIA 07/03/2010   Qualifier: Diagnosis of  By: Shelda Pal    . Unspecified mastoiditis     Past Surgical History:  Procedure Laterality Date  . ANGIOPLASTY  07/02/09, 04/01/10  . BILATERAL SALPINGOOPHORECTOMY    . CAD( bare metal stent)  02/2009   x 1  . CAROTID-SUBCLAVIAN BYPASS GRAFT Left 03/31/2018   Procedure: LEFT SUBCLAVIAN ARTERY BYPASS GRAFT;  Surgeon: Serafina Mitchell, MD;  Location: [place];  Service: Vascular;  Laterality: Left;  . CERVICAL SPINE SURGERY  08/08  . COLONOSCOPY WITH PROPOFOL N/A 04/29/2016   Procedure: COLONOSCOPY WITH PROPOFOL;  Surgeon: [name], MD;  [facility];  Service: Gastroenterology;  Laterality: N/A;  . ESOPHAGOGASTRODUODENOSCOPY N/A 12/09/2016   Procedure: ESOPHAGOGASTRODUODENOSCOPY (EGD);  Surgeon: [name], MD;  Location[facility];  Service: Gastroenterology;  Laterality: N/A;  . ESOPHAGOGASTRODUODENOSCOPY (EGD) WITH PROPOFOL N/A 04/29/2016   Procedure: ESOPHAGOGASTRODUODENOSCOPY (EGD) WITH PROPOFOL;  Surgeon: [name], MD;  [facility];  Service: Gastroenterology;  Laterality: N/A;  . EUS N/A 05/15/2016   Procedure: UPPER ENDOSCOPIC ULTRASOUND (EUS) RADIAL;  Surgeon: Milus Banister, MD;  [facility];  Service: Endoscopy;  Laterality: N/A;  . IMPLANTABLE CARDIOVERTER DEFIBRILLATOR IMPLANT N/A 08/05/2011    Primary prevention SJM ICD implanted,  Analyze ST study patient  . INNER EAR SURGERY     left x 17  . LUMBAR DISC SURGERY  02/2008   fusion  . NASAL SEPTUM SURGERY    . THORACIC AORTIC ENDOVASCULAR STENT GRAFT N/A 03/31/2018   Procedure: THORACIC AORTIC ENDOVASCULAR STENT GRAFT;  Surgeon: Serafina Mitchell, MD;  [facility];  Service: Vascular;  Laterality: N/A;  . TOTAL ABDOMINAL HYSTERECTOMY     complete    Social History   Tobacco Use  . Smoking status: Current Some Day Smoker    Packs/day: 0.13    Years: 30.00    Pack years: 3.90    Types: Cigarettes  . Smokeless tobacco: Never Used  . Tobacco comment: Restarted smoking.  few cig. daily  Substance Use Topics  . Alcohol use: No    Alcohol/week: 0.0 standard drinks    Family History  Problem Relation Age of Onset  . Colon cancer Mother   . Colon cancer Brother   . Bladder Cancer Brother   . Breast cancer Cousin   . Cancer Son     Review of Systems  Constitutional: Negative for chills and fever.  Respiratory: Negative for cough and shortness of breath.   Cardiovascular: Negative for chest pain, palpitations and leg swelling.  Gastrointestinal: Negative for abdominal pain, nausea and vomiting.  per hpi   OBJECTIVE:  Today's Vitals   09/12/19 1330  BP: 132/86  Pulse: 88  Temp: 98.1 F (36.7 C)  SpO2: 99%  Weight: 173 lb (78.5 kg)  Height: 5\' 2"  (1.575 m)   Body mass index is 31.64 kg/m.   Physical Exam Vitals and nursing note reviewed.  Constitutional:      Appearance: She is well-developed.  HENT:     Head: Normocephalic and atraumatic.     Right Ear: Hearing, tympanic membrane, ear canal and external ear normal.     Left Ear: Hearing, tympanic membrane, ear canal and external ear normal.  Eyes:     Conjunctiva/sclera: Conjunctivae  normal.     Pupils: Pupils are equal, round, and reactive to light.  Cardiovascular:     Rate and Rhythm: Normal rate and regular rhythm.     Heart sounds: Normal  heart sounds. No murmur. No friction rub. No gallop.   Pulmonary:     Effort: Pulmonary effort is normal.     Breath sounds: Normal breath sounds. No wheezing, rhonchi or rales.  Musculoskeletal:     Cervical back: Neck supple.  Lymphadenopathy:     Cervical: No cervical adenopathy.  Skin:    General: Skin is warm and dry.  Neurological:     Mental Status: She is alert and oriented to person, place, and time.     No results found for this or any previous visit (from the past 24 hour(s)).  No results found.   ASSESSMENT and PLAN  1. Pre-diabetes Checking labs today, medications will be adjusted as needed.  - Comprehensive metabolic panel - Hemoglobin A1c  2. Migraine without aura and without status migrainosus, not intractable Controlled. Continue current regime.  - amitriptyline (ELAVIL) 50 MG tablet; Take 1 tablet (50 mg total) by mouth at bedtime.  3. Essential hypertension Controlled. Continue current regime.   4. Pure hypercholesterolemia Controlled. Continue current regime.   Return in about 6 months (around 03/11/2020).    Rutherford Guys, MD [facility] at [facility] [place], [place] [address] Ph.  [phone] Fax [phone]

## 2019-09-13 LAB — COMPREHENSIVE METABOLIC PANEL
ALT: 13 IU/L (ref 0–32)
AST: 17 IU/L (ref 0–40)
Albumin/Globulin Ratio: 1.9 (ref 1.2–2.2)
Albumin: 4.7 g/dL (ref 3.7–4.7)
Alkaline Phosphatase: 105 IU/L (ref 39–117)
BUN/Creatinine Ratio: 18 (ref 12–28)
BUN: 27 mg/dL (ref 8–27)
Bilirubin Total: 0.3 mg/dL (ref 0.0–1.2)
CO2: 17 mmol/L — ABNORMAL LOW (ref 20–29)
Calcium: 9.3 mg/dL (ref 8.7–10.3)
Chloride: 109 mmol/L — ABNORMAL HIGH (ref 96–106)
Creatinine, Ser: 1.48 mg/dL — ABNORMAL HIGH (ref 0.57–1.00)
GFR calc Af Amer: 40 mL/min/{1.73_m2} — ABNORMAL LOW (ref >59)
GFR calc non Af Amer: 35 mL/min/{1.73_m2} — ABNORMAL LOW (ref >59)
Globulin, Total: 2.5 g/dL (ref 1.5–4.5)
Glucose: 128 mg/dL — ABNORMAL HIGH (ref 65–99)
Potassium: 4.8 mmol/L (ref 3.5–5.2)
Sodium: 141 mmol/L (ref 134–144)
Total Protein: 7.2 g/dL (ref 6.0–8.5)

## 2019-09-13 LAB — HEMOGLOBIN A1C
Est. average glucose Bld gHb Est-mCnc: 128 mg/dL
Hgb A1c MFr Bld: 6.1 % — ABNORMAL HIGH (ref 4.8–5.6)

## 2019-09-15 ENCOUNTER — Ambulatory Visit: Payer: Medicare Other | Admitting: Neurology

## 2019-09-19 ENCOUNTER — Telehealth: Payer: Self-pay | Admitting: *Deleted

## 2019-09-19 DIAGNOSIS — Z006 Encounter for examination for normal comparison and control in clinical research program: Secondary | ICD-10-CM

## 2019-09-19 NOTE — Telephone Encounter (Signed)
Late Entry   Spoke with pt for Beat M30 visit  via phone due to pt refusing to travel to site  COVID-19 pandemic. No AE/SAE or medication changes to report.

## 2019-11-08 ENCOUNTER — Encounter (HOSPITAL_COMMUNITY): Payer: Self-pay | Admitting: Emergency Medicine

## 2019-11-08 ENCOUNTER — Other Ambulatory Visit: Payer: Self-pay

## 2019-11-08 DIAGNOSIS — Z7982 Long term (current) use of aspirin: Secondary | ICD-10-CM | POA: Insufficient documentation

## 2019-11-08 DIAGNOSIS — S39012A Strain of muscle, fascia and tendon of lower back, initial encounter: Secondary | ICD-10-CM | POA: Diagnosis not present

## 2019-11-08 DIAGNOSIS — R110 Nausea: Secondary | ICD-10-CM | POA: Diagnosis not present

## 2019-11-08 DIAGNOSIS — I252 Old myocardial infarction: Secondary | ICD-10-CM | POA: Diagnosis not present

## 2019-11-08 DIAGNOSIS — Z79899 Other long term (current) drug therapy: Secondary | ICD-10-CM | POA: Insufficient documentation

## 2019-11-08 DIAGNOSIS — N183 Chronic kidney disease, stage 3 unspecified: Secondary | ICD-10-CM | POA: Diagnosis not present

## 2019-11-08 DIAGNOSIS — F17210 Nicotine dependence, cigarettes, uncomplicated: Secondary | ICD-10-CM | POA: Diagnosis not present

## 2019-11-08 DIAGNOSIS — Y9389 Activity, other specified: Secondary | ICD-10-CM | POA: Insufficient documentation

## 2019-11-08 DIAGNOSIS — Z8673 Personal history of transient ischemic attack (TIA), and cerebral infarction without residual deficits: Secondary | ICD-10-CM | POA: Diagnosis not present

## 2019-11-08 DIAGNOSIS — Y998 Other external cause status: Secondary | ICD-10-CM | POA: Insufficient documentation

## 2019-11-08 DIAGNOSIS — I5022 Chronic systolic (congestive) heart failure: Secondary | ICD-10-CM | POA: Diagnosis not present

## 2019-11-08 DIAGNOSIS — Y92410 Unspecified street and highway as the place of occurrence of the external cause: Secondary | ICD-10-CM | POA: Insufficient documentation

## 2019-11-08 DIAGNOSIS — S3992XA Unspecified injury of lower back, initial encounter: Secondary | ICD-10-CM | POA: Diagnosis present

## 2019-11-08 DIAGNOSIS — Z743 Need for continuous supervision: Secondary | ICD-10-CM | POA: Diagnosis not present

## 2019-11-08 DIAGNOSIS — I130 Hypertensive heart and chronic kidney disease with heart failure and stage 1 through stage 4 chronic kidney disease, or unspecified chronic kidney disease: Secondary | ICD-10-CM | POA: Insufficient documentation

## 2019-11-08 DIAGNOSIS — M545 Low back pain: Secondary | ICD-10-CM | POA: Diagnosis not present

## 2019-11-08 DIAGNOSIS — M5489 Other dorsalgia: Secondary | ICD-10-CM | POA: Diagnosis not present
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
## 2019-11-09 ENCOUNTER — Emergency Department (HOSPITAL_COMMUNITY): Payer: Medicare Other

## 2019-11-09 ENCOUNTER — Emergency Department (HOSPITAL_COMMUNITY)
Admission: EM | Admit: 2019-11-09 | Discharge: 2019-11-09 | Disposition: A | Discharge status: Home / Self Care | Payer: Medicare Other | Attending: Emergency Medicine | Admitting: Emergency Medicine

## 2019-11-09 DIAGNOSIS — S39012A Strain of muscle, fascia and tendon of lower back, initial encounter: Secondary | ICD-10-CM | POA: Diagnosis not present

## 2019-11-09 DIAGNOSIS — M545 Low back pain: Secondary | ICD-10-CM | POA: Diagnosis not present
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
## 2019-11-14 ENCOUNTER — Ambulatory Visit (INDEPENDENT_AMBULATORY_CARE_PROVIDER_SITE_OTHER): Payer: Medicare Other | Admitting: *Deleted

## 2019-11-14 ENCOUNTER — Encounter: Payer: Self-pay | Admitting: Neurology

## 2019-11-14 ENCOUNTER — Ambulatory Visit (INDEPENDENT_AMBULATORY_CARE_PROVIDER_SITE_OTHER): Payer: Medicare Other | Admitting: Neurology

## 2019-11-14 ENCOUNTER — Other Ambulatory Visit: Payer: Self-pay

## 2019-11-14 VITALS — BP 128/76 | HR 66 | Temp 97.1°F | Ht 62.0 in | Wt 174.5 lb

## 2019-11-14 DIAGNOSIS — F5104 Psychophysiologic insomnia: Secondary | ICD-10-CM | POA: Diagnosis not present

## 2019-11-14 DIAGNOSIS — G43009 Migraine without aura, not intractable, without status migrainosus: Secondary | ICD-10-CM

## 2019-11-14 DIAGNOSIS — I5022 Chronic systolic (congestive) heart failure: Secondary | ICD-10-CM

## 2019-11-14 MED ORDER — TOPIRAMATE 50 MG PO TABS: 50.0000 mg | ORAL_TABLET | Freq: Every day | ORAL | 3 refills | Status: DC

## 2019-11-14 MED ORDER — BUTALBITAL-APAP-CAFFEINE 50-325-40 MG PO TABS: 1.0000 | ORAL_TABLET | Freq: Four times a day (QID) | ORAL | 0 refills | Status: DC | PRN

## 2019-11-14 MED ORDER — CLONAZEPAM 0.5 MG PO TABS: 1.0000 mg | ORAL_TABLET | Freq: Every evening | ORAL | 0 refills | Status: DC | PRN

## 2019-11-14 NOTE — Progress Notes (Signed)
[facility]  PATIENT: [name] DOB: [birth year]  HISTORY OF PRESENT ILLNESS: [name] is a 74 year old right-handed white divorced female with a history of headaches and essential tremor. Patient of Dr. Erling Cruz,  She has history of multiple operations on the left mastoid between the ages of 89 and 66. She is currently on disability for that reason. She has CAD, PE, she has congestive heart failure, COPD, irritable bowel syndrome, left ankle fracture, hyperlipidemia, acute renal failure with stage III chronic renal disease, metabolic acidosis from Topamax, pulmonary embolism 03/2010, and a defibrillator was placed 08/2011.  She has history of migraine since age 77s, she complains of vertex area pressure headache, she has 2-6 headaches each month, She is taking amitriptyline 50mg  qhs, which has been helpful. She took overcounter medication for nause, as needed, her headache can last all day long, ASA make her stomach hurt,  She brought her headache diarrhea, she had 4-6 headache each month,  UPDATE April 2021: She was seen by nurse practitioner Hoyle Sauer over the past few years, last visit was in January 2020, overall her migraine is under good control with current Topamax 50 mg every night, she did have a history of metabolic acidosis presumably from Topamax, but she did not want to make any changes at this point,  She is also taking Elavil 50 mg every night, she reported this is from her IBS, which has helped her upset stomach in the past, but she complains of chronic constipation, again she does not want to make any medication changes  Her migraine is overall under good control, only has occasionally flareup, but in March 2021, she experienced daily headache for 2 weeks, she has take frequent Tylenol, up to 10 to 12 tablets each day, her headache is gone now, she reported rarely taking Tylenol if she does not have a headache  She also complains of difficulty sleeping,  frequent awakening at nighttime,  REVIEW OF SYSTEMS: Full 14 system review of systems performed and notable only for those listed, all others are neg:  As above  ALLERGIES: Allergies  Allergen Reactions  . Lisinopril     cough  . Sulfa Antibiotics Other (See Comments)    Unknown, childhood allergy   . Sulfonamide Derivatives Other (See Comments)    UNSURE    HOME MEDICATIONS: Outpatient Medications Prior to Visit  Medication Sig Dispense Refill  . amitriptyline (ELAVIL) 50 MG tablet Take 1 tablet (50 mg total) by mouth at bedtime. 90 tablet 1  . aspirin 81 MG tablet Take 81 mg by mouth every morning.     . bisoprolol (ZEBETA) 5 MG tablet TAKE 1 TABLET BY MOUTH DAILY. 90 tablet 3  . DEXILANT 60 MG capsule TAKE 1 CAPSULE BY MOUTH DAILY. 30 capsule 5  . famotidine (PEPCID) 20 MG tablet TAKE 1 TABLET BY MOUTH 2 TIMES DAILY. 60 tablet 3  . furosemide (LASIX) 20 MG tablet Take 2 tablets (40 mg total) by mouth daily. 180 tablet 3  . nitroGLYCERIN (NITROSTAT) 0.4 MG SL tablet Place 1 tablet (0.4 mg total) under the tongue every 5 (five) minutes as needed for chest pain. 25 tablet 11  . polyethylene glycol powder (GLYCOLAX/MIRALAX) powder Take 17 g by mouth daily as needed for mild constipation. 255 g 11  . PREMARIN vaginal cream   12  . PROAIR HFA 108 (90 Base) MCG/ACT inhaler INHALE 2 PUFFS INTO THE LUNGS EVERY 6 HOURS AS NEEDED. FOR SHORTNESS OF BREATH 8.5 g 4  .  rosuvastatin (CRESTOR) 20 MG tablet TAKE 1 TABLET BY MOUTH AT BEDTIME. 90 tablet 3  . topiramate (TOPAMAX) 50 MG tablet Take 1 tablet (50 mg total) by mouth daily. 90 tablet 0   No facility-administered medications prior to visit.    PAST MEDICAL HISTORY: Past Medical History:  Diagnosis Date  . Abdominal pain, left lower quadrant 08/14/2009   Qualifier: Diagnosis of  By: Laney Potash, Pam    . Abnormal CT scan, stomach   . Abnormal LFTs 06/19/2017  . AICD (automatic cardioverter/defibrillator) present    Dr.Allred  follows  . Aortic arch pseudoaneurysm (HCC)    a. followed by Dr. Prescott Gum.  . Arthritis   . Asthma   . Benign neoplasm of colon   . CAD (coronary artery disease) 02/2009   a. anterior STEMI rx with BMS to prox LAD in 02/2009. b. ISR s/p PTCA 06/2009. c. ISR s/p thrombectomy & PTCA 03/2010 due to late stent thrombosis. // Myoview 04/2019: EF 28, ant, ant-sept, inf-sept scar, no ischemia, high risk (stable>>cont med Rx)   . Cardiomyopathy, ischemic 06/12/2011  . Chronic renal insufficiency    stage 3  . Chronic systolic CHF (congestive heart failure) ([place])    a. s/p ST. Jude ICD 10-19-11.  Marland Kitchen Chronic systolic heart failure ([place]) 08/05/2011  . Colon polyp   . Complication of anesthesia   . COPD (chronic obstructive pulmonary disease) ([place])   . CORONARY ARTERY DISEASE 04/24/2009   Qualifier: Diagnosis of  By: Trellis Paganini PA-c, Amy S   . DDD (degenerative disc disease), lumbar 05/16/2011  . Depression    "since my son died in 11-19-2022" Oct 18, 2017)  . Diverticulosis   . DIVERTICULOSIS-COLON 08/14/2009   Qualifier: Diagnosis of  By: Trellis Paganini PA-c, Amy S   . DYSPHAGIA UNSPECIFIED 04/24/2009   Qualifier: Diagnosis of  By: Laney Potash, Pam    . Dyspnea    "when I lay down at night"   . Essential hypertension 09/05/2009   Qualifier: Diagnosis of  By: Harvest Dark CMA, Anderson [place]    . Gastric polyp   . GERD 10/19/2007   Qualifier: Diagnosis of  By: Rennie Plowman RN, Blanca Friend: Diagnosis of  By: Laney Potash, Pam    . GERD (gastroesophageal reflux disease)   . GI bleed    a. h/o GIB on DAPT, now on ASA only.  Marland Kitchen Headache 01/03/2013  . Hearing loss    left ear  . History of pulmonary embolus (PE) 01/02/2011  . Hyperlipemia   . Hyperlipidemia, unspecified 04/24/2009   Qualifier: Diagnosis of  By: Trellis Paganini PA-c, Amy S   . Hypertension   . ICD-St.Jude 08/06/2011   S/p St. Jude ICD placement 08/05/11   . Insomnia   . Irritable bowel syndrome   . Ischemic cardiomyopathy    EF 10-15%  . Memory loss   . Migraine  headache without aura   . Myocardial infarct [facility]) 2009/10/18 x 2   Dr. Roswell Nickel   . PERSONAL HX COLONIC POLYPS 04/24/2009   Qualifier: Diagnosis of  By: Shane Crutch, Amy S November, 2011 colonoscopy demonstrated a sessile cecal polyp and sigmoid polyp   . Pneumonia   . PONV (postoperative nausea and vomiting)   . Pre-diabetes   . Pseudoaneurysm of aortic arch ([place]) 05/05/2012  . Pulmonary embolism ([place]) Oct 18, 2009  . Stroke [facility])    'When I was young." no residual  . Thoracic aortic aneurysm ([place]) 03/31/2018  . TOBACCO ABUSE 04/04/2009   Qualifier: Diagnosis of  By: Bonnell Public,  PA-C, Dionne Ano  Qualifier: Diagnosis of  By: [name], MD, Jaquelyn Bitter   . Transfusion history    ?'12 or '13  . UNSPECIFIED ANEMIA 07/03/2010   Qualifier: Diagnosis of  By: Shelda Pal    . Unspecified mastoiditis     PAST SURGICAL HISTORY: Past Surgical History:  Procedure Laterality Date  . ANGIOPLASTY  07/02/09, 04/01/10  . BILATERAL SALPINGOOPHORECTOMY    . CAD( bare metal stent)  02/2009   x 1  . CAROTID-SUBCLAVIAN BYPASS GRAFT Left 03/31/2018   Procedure: LEFT SUBCLAVIAN ARTERY BYPASS GRAFT;  Surgeon: Serafina Mitchell, MD;  Location: [facility];  Service: Vascular;  Laterality: Left;  . CERVICAL SPINE SURGERY  08/08  . COLONOSCOPY WITH PROPOFOL N/A 04/29/2016   Procedure: COLONOSCOPY WITH PROPOFOL;  Surgeon: [name], MD;  Location: [facility];  Service: Gastroenterology;  Laterality: N/A;  . ESOPHAGOGASTRODUODENOSCOPY N/A 12/09/2016   Procedure: ESOPHAGOGASTRODUODENOSCOPY (EGD);  Surgeon: [name], MD;  Location: [facility];  Service: Gastroenterology;  Laterality: N/A;  . ESOPHAGOGASTRODUODENOSCOPY (EGD) WITH PROPOFOL N/A 04/29/2016   Procedure: ESOPHAGOGASTRODUODENOSCOPY (EGD) WITH PROPOFOL;  Surgeon: [name], MD;  Location: [facility];  Service: Gastroenterology;  Laterality: N/A;  . EUS N/A 05/15/2016   Procedure: UPPER ENDOSCOPIC ULTRASOUND (EUS)  RADIAL;  Surgeon: Milus Banister, MD;  Location: [facility];  Service: Endoscopy;  Laterality: N/A;  . IMPLANTABLE CARDIOVERTER DEFIBRILLATOR IMPLANT N/A 08/05/2011   Primary prevention SJM ICD implanted,  Analyze ST study patient  . INNER EAR SURGERY     left x 17  . LUMBAR DISC SURGERY  02/2008   fusion  . NASAL SEPTUM SURGERY    . THORACIC AORTIC ENDOVASCULAR STENT GRAFT N/A 03/31/2018   Procedure: THORACIC AORTIC ENDOVASCULAR STENT GRAFT;  Surgeon: Serafina Mitchell, MD;  Location: [facility];  Service: Vascular;  Laterality: N/A;  . TOTAL ABDOMINAL HYSTERECTOMY     complete    FAMILY HISTORY: Family History  Problem Relation Age of Onset  . Colon cancer Mother   . Colon cancer Brother   . Bladder Cancer Brother   . Breast cancer Cousin   . Cancer Son     SOCIAL HISTORY: Social History   Socioeconomic History  . Marital status: Divorced    Spouse name: Not on file  . Number of children: 2  . Years of education: 64  . Highest education level: Not on file  Occupational History  . Occupation: Merchandiser, retail: UNEMPLOYED    Employer: DISABLED  Tobacco Use  . Smoking status: Current Some Day Smoker    Packs/day: 0.13    Years: 30.00    Pack years: 3.90    Types: Cigarettes  . Smokeless tobacco: Never Used  . Tobacco comment: Restarted smoking.  few cig. daily  Substance and Sexual Activity  . Alcohol use: No    Alcohol/week: 0.0 standard drinks  . Drug use: No  . Sexual activity: Not on file  Other Topics Concern  . Not on file  Social History Narrative   Pt lives in [place] alone.  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   Patient has 11 th grade education.Right handed.   Caffeine- one cup daily         Social Determinants of Health   Financial Resource Strain:   . Difficulty of Paying Living Expenses:   Food Insecurity:   . Worried About Charity fundraiser in the Last Year:   . [employer] of  Food in the Last Year:   Transportation Needs:    . Film/video editor (Medical):   Marland Kitchen Lack of Transportation (Non-Medical):   Physical Activity:   . Days of Exercise per Week:   . Minutes of Exercise per Session:   Stress:   . Feeling of Stress :   Social Connections:   . Frequency of Communication with Friends and Family:   . Frequency of Social Gatherings with Friends and Family:   . Attends Religious Services:   . Active Member of Clubs or Organizations:   . Attends Archivist Meetings:   Marland Kitchen Marital Status:   Intimate Partner Violence:   . Fear of Current or Ex-Partner:   . Emotionally Abused:   Marland Kitchen Physically Abused:   . Sexually Abused:      PHYSICAL EXAM  Vitals:   11/14/19 1309  BP: 128/76  Pulse: 66  Temp: (!) 97.1 F (36.2 C)  Weight: 174 lb 8 oz (79.2 kg)  Height: 5\' 2"  (1.575 m)   Body mass index is 31.92 kg/m.   PHYSICAL EXAMNIATION:  Gen: NAD, conversant, well nourised, well groomed                     Cardiovascular: Regular rate rhythm, no peripheral edema, warm, nontender. Eyes: Conjunctivae clear without exudates or hemorrhage Neck: Supple, no carotid bruits. Pulmonary: Clear to auscultation bilaterally   NEUROLOGICAL EXAM:  MENTAL STATUS: Speech/cognition:    Speech is normal; fluent and spontaneous with normal comprehension.  Gives good history, know her medicines well   CRANIAL NERVES: CN II: Visual fields are full to confrontation.  Pupils are round equal and briskly reactive to light. CN III, IV, VI: extraocular movement are normal. No ptosis. CN V: Facial sensation is intact to pinprick in all 3 divisions bilaterally. Corneal responses are intact.  CN VII: Face is symmetric with normal eye closure and smile. CN VIII: Hearing is normal to casual conversation CN IX, X: Palate elevates symmetrically. Phonation is normal. CN XI: Head turning and shoulder shrug are intact CN XII: Tongue is midline with normal movements and no atrophy.  MOTOR: There is no pronator drift of  out-stretched arms. Muscle bulk and tone are normal. Muscle strength is normal.  REFLEXES: Reflexes are 2+ and symmetric at the biceps, triceps, knees, and ankles. Plantar responses are flexor.  SENSORY: Intact to light touch, pinprick, and vibratory sensation  COORDINATION: Rapid alternating movements and fine finger movements are intact. There is no dysmetria on finger-to-nose and heel-knee-shin.    GAIT/STANCE: Posture is normal. Gait is steady with normal steps, base, arm swing, and turning.    DIAGNOSTIC DATA (LABS, IMAGING, TESTING) - I reviewed patient records, labs, notes, testing and imaging myself where available.  Lab Results  Component Value Date   WBC 8.4 04/15/2018   HGB 12.9 04/15/2018   HCT 39.1 04/15/2018   MCV 94.3 04/15/2018   PLT 150 04/01/2018      Component Value Date/Time   NA 141 09/12/2019 1527   K 4.8 09/12/2019 1527   CL 109 (H) 09/12/2019 1527   CO2 17 (L) 09/12/2019 1527   GLUCOSE 128 (H) 09/12/2019 1527   GLUCOSE 122 (H) 04/01/2018 0321   BUN 27 09/12/2019 1527   CREATININE 1.48 (H) 09/12/2019 1527   CREATININE 1.56 (H) 01/15/2016 1345   CALCIUM 9.3 09/12/2019 1527   PROT 7.2 09/12/2019 1527   ALBUMIN 4.7 09/12/2019 1527   AST 17 09/12/2019 1527  ALT 13 09/12/2019 1527   ALKPHOS 105 09/12/2019 1527   BILITOT 0.3 09/12/2019 1527   GFRNONAA 35 (L) 09/12/2019 1527   GFRAA 40 (L) 09/12/2019 1527     Lab Results  Component Value Date   TSH 4.270 03/10/2019      ASSESSMENT AND PLAN  74 y.o. year old female    Chronic migraine headaches  She is very resistant to medication changes,  Refilled her Topamax 50 mg every night, Elavil 50 mg every night  Fioricet as needed I have advised her avoiding excessive use of over-the-counter medications,  Chronic insomnia  Clonazepam 0.5 mg as needed  Marcial Pacas, M.D. Ph.D.  [facility] [place], [place] [address] Phone: [phone] Fax:      [phone]

## 2019-11-22 LAB — CUP PACEART REMOTE DEVICE CHECK
Battery Remaining Longevity: 35 mo
Battery Remaining Percentage: 30 %
Battery Voltage: 2.86 V
Brady Statistic RV Percent Paced: 1 %
Date Time Interrogation Session: 20210420000635
HighPow Impedance: 79 Ohm
HighPow Impedance: 79 Ohm
Implantable Lead Implant Date: 20130108
Implantable Lead Location: 753860
Implantable Pulse Generator Implant Date: 20130108
Lead Channel Impedance Value: 450 Ohm
Lead Channel Pacing Threshold Amplitude: 0.75 V
Lead Channel Pacing Threshold Pulse Width: 0.5 ms
Lead Channel Sensing Intrinsic Amplitude: 12 mV
Lead Channel Setting Pacing Amplitude: 2.5 V
Lead Channel Setting Pacing Pulse Width: 0.5 ms
Lead Channel Setting Sensing Sensitivity: 0.5 mV
Pulse Gen Serial Number: 1016523

## 2019-11-23 NOTE — Progress Notes (Signed)
ICD Remote  

## 2019-12-19 ENCOUNTER — Encounter: Payer: Self-pay | Admitting: *Deleted

## 2019-12-19 DIAGNOSIS — Z006 Encounter for examination for normal comparison and control in clinical research program: Secondary | ICD-10-CM
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
## 2019-12-27 ENCOUNTER — Other Ambulatory Visit: Payer: Self-pay | Admitting: Family Medicine

## 2019-12-27 ENCOUNTER — Other Ambulatory Visit: Payer: Self-pay | Admitting: Gastroenterology

## 2019-12-27 DIAGNOSIS — K219 Gastro-esophageal reflux disease without esophagitis: Secondary | ICD-10-CM

## 2020-01-23 ENCOUNTER — Other Ambulatory Visit: Payer: Self-pay | Admitting: Cardiovascular Disease

## 2020-01-23 ENCOUNTER — Other Ambulatory Visit: Payer: Self-pay | Admitting: Gastroenterology

## 2020-01-26 ENCOUNTER — Other Ambulatory Visit: Payer: Self-pay | Admitting: Cardiovascular Disease

## 2020-01-26 MED ORDER — ENTRESTO 49-51 MG PO TABS: 1.0000 | ORAL_TABLET | Freq: Two times a day (BID) | ORAL | 1 refills | Status: DC

## 2020-01-26 NOTE — Telephone Encounter (Signed)
°*  STAT* If patient is at the pharmacy, call can be transferred to refill team.   1. Which medications need to be refilled? (please list name of each medication and dose if known)  ENTRESTO 24-26 MG  2. Which pharmacy/location (including street and city if local pharmacy) is medication to be sent to? [place], [place]  3. Do they need a 30 day or 90 day supply? 90 day supply

## 2020-01-26 NOTE — Telephone Encounter (Signed)
Called and spoke to patient. She is requesting a refill on her entresto Rx. She is currently not home to confirm her dose and it is currently not on her med list. Patient was last seen by Richardson Dopp, PA on 07/04/19 and was instructed to continue current dose of 49/51 mg BID. Patient states that she has been taking the same dose for over a year. Confirmed dose with Dr. Antionette Char RN. Refill sent in to preferred pharmacy. Patient is also due for follow up. She states that she will call back tomorrow once she has her calendar to schedule her follow up appointment.

## 2020-01-27 ENCOUNTER — Encounter: Payer: Self-pay | Admitting: *Deleted

## 2020-01-27 DIAGNOSIS — Z006 Encounter for examination for normal comparison and control in clinical research program: Secondary | ICD-10-CM

## 2020-01-27 NOTE — Research (Signed)
Called patient this morning to see how her lower back pain was doing and she said it was all better. I will close out the AE in the [facility].

## 2020-02-08 ENCOUNTER — Ambulatory Visit: Payer: Medicare Other | Admitting: Registered Nurse

## 2020-02-08 VITALS — BP 128/76 | Ht 62.0 in | Wt 174.0 lb

## 2020-02-08 DIAGNOSIS — Z0000 Encounter for general adult medical examination without abnormal findings: Secondary | ICD-10-CM

## 2020-02-08 NOTE — Patient Instructions (Addendum)
Thank you for taking time to come for your Medicare Wellness Visit. I appreciate your ongoing commitment to your health goals. Please review the following plan we discussed and let me know if I can assist you in the future.  Kadra Kohan LPN  Preventive Care 72 Years and Older, Female Preventive care refers to lifestyle choices and visits with your health care provider that can promote health and wellness. This includes:  A yearly physical exam. This is also called an annual well check.  Regular dental and eye exams.  Immunizations.  Screening for certain conditions.  Healthy lifestyle choices, such as diet and exercise. What can I expect for my preventive care visit? Physical exam Your health care provider will check:  Height and weight. These may be used to calculate body mass index (BMI), which is a measurement that tells if you are at a healthy weight.  Heart rate and blood pressure.  Your skin for abnormal spots. Counseling Your health care provider may ask you questions about:  Alcohol, tobacco, and drug use.  Emotional well-being.  Home and relationship well-being.  Sexual activity.  Eating habits.  History of falls.  Memory and ability to understand (cognition).  Work and work environment.  Pregnancy and menstrual history. What immunizations do I need?  Influenza (flu) vaccine  This is recommended every year. Tetanus, diphtheria, and pertussis (Tdap) vaccine  You may need a Td booster every 10 years. Varicella (chickenpox) vaccine  You may need this vaccine if you have not already been vaccinated. Zoster (shingles) vaccine  You may need this after age 72. Pneumococcal conjugate (PCV13) vaccine  One dose is recommended after age 72. Pneumococcal polysaccharide (PPSV23) vaccine  One dose is recommended after age 72. Measles, mumps, and rubella (MMR) vaccine  You may need at least one dose of MMR if you were born in 1957 or later. You may also  need a second dose. Meningococcal conjugate (MenACWY) vaccine  You may need this if you have certain conditions. Hepatitis A vaccine  You may need this if you have certain conditions or if you travel or work in places where you may be exposed to hepatitis A. Hepatitis B vaccine  You may need this if you have certain conditions or if you travel or work in places where you may be exposed to hepatitis B. Haemophilus influenzae type b (Hib) vaccine  You may need this if you have certain conditions. You may receive vaccines as individual doses or as more than one vaccine together in one shot (combination vaccines). Talk with your health care provider about the risks and benefits of combination vaccines. What tests do I need? Blood tests  Lipid and cholesterol levels. These may be checked every 5 years, or more frequently depending on your overall health.  Hepatitis C test.  Hepatitis B test. Screening  Lung cancer screening. You may have this screening every year starting at age 55 if you have a 30-pack-year history of smoking and currently smoke or have quit within the past 15 years.  Colorectal cancer screening. All adults should have this screening starting at age 50 and continuing until age 75. Your health care provider may recommend screening at age 45 if you are at increased risk. You will have tests every 1-10 years, depending on your results and the type of screening test.  Diabetes screening. This is done by checking your blood sugar (glucose) after you have not eaten for a while (fasting). You may have this done every 1-3   years.  Mammogram. This may be done every 1-2 years. Talk with your health care provider about how often you should have regular mammograms.  BRCA-related cancer screening. This may be done if you have a family history of breast, ovarian, tubal, or peritoneal cancers. Other tests  Sexually transmitted disease (STD) testing.  Bone density scan. This is done  to screen for osteoporosis. You may have this done starting at age 72. Follow these instructions at home: Eating and drinking  Eat a diet that includes fresh fruits and vegetables, whole grains, lean protein, and low-fat dairy products. Limit your intake of foods with high amounts of sugar, saturated fats, and salt.  Take vitamin and mineral supplements as recommended by your health care provider.  Do not drink alcohol if your health care provider tells you not to drink.  If you drink alcohol: ? Limit how much you have to 0-1 drink a day. ? Be aware of how much alcohol is in your drink. In the [place] one drink equals one 12 oz bottle of beer (355 mL), one 5 oz glass of wine (148 mL), or one 1 oz glass of hard liquor (44 mL). Lifestyle  Take daily care of your teeth and gums.  Stay active. Exercise for at least 30 minutes on 5 or more days each week.  Do not use any products that contain nicotine or tobacco, such as cigarettes, e-cigarettes, and chewing tobacco. If you need help quitting, ask your health care provider.  If you are sexually active, practice safe sex. Use a condom or other form of protection in order to prevent STIs (sexually transmitted infections).  Talk with your health care provider about taking a low-dose aspirin or statin. What's next?  Go to your health care provider once a year for a well check visit.  Ask your health care provider how often you should have your eyes and teeth checked.  Stay up to date on all vaccines. This information is not intended to replace advice given to you by your health care provider. Make sure you discuss any questions you have with your health care provider. Document Revised: 07/08/2018 Document Reviewed: 07/08/2018 Elsevier Patient Education  2020 [employer].

## 2020-02-08 NOTE — Progress Notes (Signed)
Presents today for [employer] Visit   Date of last exam: 09-12-2019  Interpreter used for this visit? No  I connected with  [name] on 02/08/20 by a telephone application and verified that I am speaking with the correct person using two identifiers.   I discussed the limitations of evaluation and management by telemedicine. The patient expressed understanding and agreed to proceed.    Patient Care Team: Rutherford Guys, MD as PCP - General (Family Medicine) [name], MD as PCP - Cardiology (Cardiology) Thompson Grayer, MD as PCP - Electrophysiology (Cardiology) Prescott Gum, Collier Salina, MD as Attending Physician (Cardiothoracic Surgery) Kary Kos, MD (Neurosurgery) Rozetta Nunnery, MD (Otolaryngology) Love, Alyson Locket, MD (Neurology) Thompson Grayer, MD as Consulting Physician (Cardiology) Armbruster, Carlota Raspberry, MD as Consulting Physician (Gastroenterology)   Other items to address today:  Discussed Eye/Dental Discussed Immunizations Will bring COVID vaccination card to follow up with Dr. Pamella Pert 8-16 @ 1:20  Other Screening: Last screening for diabetes: 09/12/2019 Last lipid screening: 03/10/2019  ADVANCE DIRECTIVES: Discussed: yes On File: no Materials Provided: yes  Immunization status:  Immunization History  Administered Date(s) Administered  . Influenza Nasal 04/27/2017  . Influenza Split 08/06/2011  . Influenza Whole 04/27/2009  . Influenza, High Dose Seasonal PF 04/29/2013, 06/09/2014, 05/12/2015, 04/03/2016, 04/21/2017, 04/17/2018, 04/25/2018, 04/18/2019  . Influenza-Unspecified 04/15/2012  . Moderna SARS-COVID-2 Vaccination 09/09/2019  . Pneumococcal Conjugate-13 06/14/2014, 06/16/2018  . Pneumococcal Polysaccharide-23 04/27/2009, 08/06/2011, 04/15/2012, 04/03/2016  . Zoster 05/03/2013     Health Maintenance Due  Topic Date Due  . MAMMOGRAM  03/27/2012  . DEXA SCAN  Never done  . COVID-19 Vaccine (2 - Moderna 2-dose  series) 10/07/2019     Functional Status Survey: Is the patient deaf or have difficulty hearing?: No Does the patient have difficulty seeing, even when wearing glasses/contacts?: No Does the patient have difficulty concentrating, remembering, or making decisions?: No Does the patient have difficulty walking or climbing stairs?: No Does the patient have difficulty dressing or bathing?: No Does the patient have difficulty doing errands alone such as visiting a doctor's office or shopping?: No   6CIT Screen 02/08/2020 01/31/2019  What Year? 0 points 0 points  What month? 0 points 0 points  What time? 0 points 0 points  Count back from 20 0 points 0 points  Months in reverse 0 points 4 points  Repeat phrase 10 points 0 points  Total Score 10 4        Clinical Support from 02/08/2020 in [facility]  AUDIT-C Score 0       Home Environment:   Yes trouble climbing stairs (SOB) Lives in one story home  No scattered rugs Yes grab bars Adequate lighting/ no clutter   Patient Active Problem List   Diagnosis Date Noted  . Chronic insomnia 11/14/2019  . Grief at loss of child 08/05/2018  . Colon polyp 06/16/2018  . Thoracic aortic aneurysm ([place]) 03/31/2018  . Chronic renal insufficiency   . AICD (automatic cardioverter/defibrillator) present   . Chronic systolic CHF (congestive heart failure) ([place])   . COPD (chronic obstructive pulmonary disease) ([place])   . Hyperlipemia   . Insomnia   . Migraine headache without aura   . Pre-diabetes 06/19/2017  . Abnormal LFTs 06/19/2017  . Abnormal CT scan, stomach   . Gastric polyp   . Osteoporosis 05/11/2013  . Hearing loss 04/29/2013  . Mild cognitive impairment 04/29/2013  . Headache 01/03/2013  . Tremor 08/13/2012  .  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 ICD-St.Jude 08/06/2011  . Cardiomyopathy, ischemic 06/12/2011  . DDD (degenerative disc disease), lumbar 05/16/2011  . History of pulmonary embolus (PE)  01/02/2011  . UNSPECIFIED ANEMIA 07/03/2010  . Essential hypertension 09/05/2009  . DIVERTICULOSIS-COLON 08/14/2009  . ABDOMINAL PAIN, LEFT LOWER QUADRANT 08/14/2009  . Hyperlipidemia, unspecified 04/24/2009  . DYSPHAGIA UNSPECIFIED 04/24/2009  . PERSONAL HX COLONIC POLYPS 04/24/2009  . TOBACCO ABUSE 04/04/2009  . CAD (coronary artery disease) 02/25/2009  . MASTOIDITIS 10/19/2007  . GERD 10/19/2007  . IBS 10/19/2007  . COLONIC POLYPS, ADENOMATOUS 05/12/2007     Past Medical History:  Diagnosis Date  . Abdominal pain, left lower quadrant 08/14/2009   Qualifier: Diagnosis of  By: Laney Potash, Pam    . Abnormal CT scan, stomach   . Abnormal LFTs 06/19/2017  . AICD (automatic cardioverter/defibrillator) present    Dr.Allred follows  . Aortic arch pseudoaneurysm (HCC)    a. followed by Dr. Prescott Gum.  . Arthritis   . Asthma   . Benign neoplasm of colon   . CAD (coronary artery disease) 02/2009   a. anterior STEMI rx with BMS to prox LAD in 02/2009. b. ISR s/p PTCA 06/2009. c. ISR s/p thrombectomy & PTCA 03/2010 due to late stent thrombosis. // Myoview 04/2019: EF 28, ant, ant-sept, inf-sept scar, no ischemia, high risk (stable>>cont med Rx)   . Cardiomyopathy, ischemic 06/12/2011  . Chronic renal insufficiency    stage 3  . Chronic systolic CHF (congestive heart failure) ([place])    a. s/p ST. Jude ICD 22-Oct-2011.  Marland Kitchen Chronic systolic heart failure ([place]) 08/05/2011  . Colon polyp   . Complication of anesthesia   . COPD (chronic obstructive pulmonary disease) ([place])   . CORONARY ARTERY DISEASE 04/24/2009   Qualifier: Diagnosis of  By: Trellis Paganini PA-c, Amy S   . DDD (degenerative disc disease), lumbar 05/16/2011  . Depression    "since my son died in 2022/11/22" 2017/10/21)  . Diverticulosis   . DIVERTICULOSIS-COLON 08/14/2009   Qualifier: Diagnosis of  By: Trellis Paganini PA-c, Amy S   . DYSPHAGIA UNSPECIFIED 04/24/2009   Qualifier: Diagnosis of  By: Laney Potash, Pam    . Dyspnea    "when I lay down at  night"   . Essential hypertension 09/05/2009   Qualifier: Diagnosis of  By: Harvest Dark CMA, Anderson Malta    . Gastric polyp   . GERD 10/19/2007   Qualifier: Diagnosis of  By: Rennie Plowman RN, Blanca Friend: Diagnosis of  By: Laney Potash, Pam    . GERD (gastroesophageal reflux disease)   . GI bleed    a. h/o GIB on DAPT, now on ASA only.  Marland Kitchen Headache 01/03/2013  . Hearing loss    left ear  . History of pulmonary embolus (PE) 01/02/2011  . Hyperlipemia   . Hyperlipidemia, unspecified 04/24/2009   Qualifier: Diagnosis of  By: Trellis Paganini PA-c, Amy S   . Hypertension   . ICD-St.Jude 08/06/2011   S/p St. Jude ICD placement 08/05/11   . Insomnia   . Irritable bowel syndrome   . Ischemic cardiomyopathy    EF 10-15%  . Memory loss   . Migraine headache without aura   . Myocardial infarct [facility]) 21-Oct-2009 x 2   Dr. Roswell Nickel   . PERSONAL HX COLONIC POLYPS 04/24/2009   Qualifier: Diagnosis of  By: Shane Crutch, Amy S November, 2011 colonoscopy demonstrated a sessile cecal polyp and sigmoid polyp   . Pneumonia   . PONV (  postoperative nausea and vomiting)   . Pre-diabetes   . 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 Pulmonary embolism ([place]) 2011  . Stroke [facility])    'When I was young." no residual  . Thoracic aortic aneurysm ([place]) 03/31/2018  . TOBACCO ABUSE 04/04/2009   Qualifier: Diagnosis of  By: Arvid Right  Qualifier: Diagnosis of  By: [name], MD, Jaquelyn Bitter   . Transfusion history    ?'12 or '13  . UNSPECIFIED ANEMIA 07/03/2010   Qualifier: Diagnosis of  By: Shelda Pal    . Unspecified mastoiditis      Past Surgical History:  Procedure Laterality Date  . ANGIOPLASTY  07/02/09, 04/01/10  . BILATERAL SALPINGOOPHORECTOMY    . CAD( bare metal stent)  02/2009   x 1  . CAROTID-SUBCLAVIAN BYPASS GRAFT Left 03/31/2018   Procedure: LEFT SUBCLAVIAN ARTERY BYPASS GRAFT;  Surgeon: Serafina Mitchell, MD;  Location: [facility];  Service: Vascular;  Laterality: Left;  . CERVICAL  SPINE SURGERY  08/08  . COLONOSCOPY WITH PROPOFOL N/A 04/29/2016   Procedure: COLONOSCOPY WITH PROPOFOL;  Surgeon: [name], MD;  Location: [facility];  Service: Gastroenterology;  Laterality: N/A;  . ESOPHAGOGASTRODUODENOSCOPY N/A 12/09/2016   Procedure: ESOPHAGOGASTRODUODENOSCOPY (EGD);  Surgeon: [name], MD;  Location: [facility];  Service: Gastroenterology;  Laterality: N/A;  . ESOPHAGOGASTRODUODENOSCOPY (EGD) WITH PROPOFOL N/A 04/29/2016   Procedure: ESOPHAGOGASTRODUODENOSCOPY (EGD) WITH PROPOFOL;  Surgeon: [name], MD;  Location: [facility];  Service: Gastroenterology;  Laterality: N/A;  . EUS N/A 05/15/2016   Procedure: UPPER ENDOSCOPIC ULTRASOUND (EUS) RADIAL;  Surgeon: Milus Banister, MD;  Location: [facility];  Service: Endoscopy;  Laterality: N/A;  . IMPLANTABLE CARDIOVERTER DEFIBRILLATOR IMPLANT N/A 08/05/2011   Primary prevention SJM ICD implanted,  Analyze ST study patient  . INNER EAR SURGERY     left x 17  . LUMBAR DISC SURGERY  02/2008   fusion  . NASAL SEPTUM SURGERY    . THORACIC AORTIC ENDOVASCULAR STENT GRAFT N/A 03/31/2018   Procedure: THORACIC AORTIC ENDOVASCULAR STENT GRAFT;  Surgeon: Serafina Mitchell, MD;  Location: [facility] OR;  Service: Vascular;  Laterality: N/A;  . TOTAL ABDOMINAL HYSTERECTOMY     complete     Family History  Problem Relation Age of Onset  . Colon cancer Mother   . Colon cancer Brother   . Bladder Cancer Brother   . Breast cancer Cousin   . Cancer Son      Social History   Socioeconomic History  . Marital status: Divorced    Spouse name: Not on file  . Number of children: 2  . Years of education: 25  . Highest education level: Not on file  Occupational History  . Occupation: Merchandiser, retail: UNEMPLOYED    Employer: DISABLED  Tobacco Use  . Smoking status: Current Some Day Smoker    Packs/day: 0.13    Years: 30.00    Pack years: 3.90    Types: Cigarettes  . Smokeless  tobacco: Never Used  . Tobacco comment: Restarted smoking.  few cig. daily  Vaping Use  . Vaping Use: Never used  Substance and Sexual Activity  . Alcohol use: No    Alcohol/week: 0.0 standard drinks  . Drug use: No  . Sexual activity: Not on file  Other Topics Concern  . Not on file  Social History Narrative   Pt lives in [place] alone.  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  Patient has 11 th grade education.Right handed.   Caffeine- one cup daily         Social Determinants of Health   Financial Resource Strain:   . Difficulty of Paying Living Expenses:   Food Insecurity:   . Worried About Charity fundraiser in the Last Year:   . Arboriculturist in the Last Year:   Transportation Needs:   . Film/video editor (Medical):   Marland Kitchen Lack of Transportation (Non-Medical):   Physical Activity:   . Days of Exercise per Week:   . Minutes of Exercise per Session:   Stress:   . Feeling of Stress :   Social Connections:   . Frequency of Communication with Friends and Family:   . Frequency of Social Gatherings with Friends and Family:   . Attends Religious Services:   . Active Member of Clubs or Organizations:   . Attends Archivist Meetings:   Marland Kitchen Marital Status:   Intimate Partner Violence:   . Fear of Current or Ex-Partner:   . Emotionally Abused:   Marland Kitchen Physically Abused:   . Sexually Abused:      Allergies  Allergen Reactions  . Lisinopril     cough  . Sulfa Antibiotics Other (See Comments)    Unknown, childhood allergy   . Sulfonamide Derivatives Other (See Comments)    UNSURE     Prior to Admission medications   Medication Sig Start Date End Date Taking? Authorizing Provider  amitriptyline (ELAVIL) 50 MG tablet Take 1 tablet (50 mg total) by mouth at bedtime. 09/12/19  Yes Rutherford Guys, MD  aspirin 81 MG tablet Take 81 mg by mouth every morning.    Yes [provider]  bisoprolol (ZEBETA) 5 MG tablet TAKE 1 TABLET BY MOUTH  DAILY. 03/29/19  Yes [name], MD  butalbital-acetaminophen-caffeine (FIORICET) [phone] MG tablet Take 1 tablet by mouth every 6 (six) hours as needed for headache. Good for 90 days 11/14/19  Yes Marcial Pacas, MD  dexlansoprazole (DEXILANT) 60 MG capsule Take 1 capsule (60 mg total) by mouth daily. Please schedule an OV for further refills.Thank you 12/27/19  Yes Armbruster, Carlota Raspberry, MD  famotidine (PEPCID) 20 MG tablet Take 1 tablet (20 mg total) by mouth 2 (two) times daily. Please schedule a yearly follow up for further refills. Thank you. 01/23/20  Yes Armbruster, Carlota Raspberry, MD  furosemide (LASIX) 20 MG tablet Take 2 tablets (40 mg total) by mouth daily. 05/12/19  Yes Weaver, Scott T, PA-C  nitroGLYCERIN (NITROSTAT) 0.4 MG SL tablet Place 1 tablet (0.4 mg total) under the tongue every 5 (five) minutes as needed for chest pain. 03/29/19 03/28/20 Yes Weaver, Scott T, PA-C  polyethylene glycol powder (GLYCOLAX/MIRALAX) powder Take 17 g by mouth daily as needed for mild constipation. 07/08/17  Yes Rutherford Guys, MD  PREMARIN vaginal cream  05/25/18  Yes [provider]  PROAIR HFA 108 (90 Base) MCG/ACT inhaler INHALE 2 PUFFS INTO THE LUNGS EVERY 6 HOURS AS NEEDED FOR SHORTNESS OF BREATH 12/27/19  Yes Rutherford Guys, MD  rosuvastatin (CRESTOR) 20 MG tablet TAKE 1 TABLET BY MOUTH AT BEDTIME. 03/01/19  Yes [name], MD  sacubitril-valsartan (ENTRESTO) 49-51 MG Take 1 tablet by mouth 2 (two) times daily. 01/26/20  Yes Weaver, Scott T, PA-C  topiramate (TOPAMAX) 50 MG tablet Take 1 tablet (50 mg total) by mouth daily. 11/14/19  Yes Marcial Pacas, MD  clonazePAM (KLONOPIN) 0.5 MG tablet Take 2  tablets (1 mg total) by mouth at bedtime as needed for anxiety. Good for 90 days Patient not taking: Reported on 02/08/2020 11/14/19   Marcial Pacas, MD     Depression screen [facility] 2/9 02/08/2020 09/12/2019 12/18/2017 11/21/2017 10/29/2017  Decreased Interest 0 0 0 0 (No Data)  Down, Depressed, Hopeless 0 0 0 0 -  PHQ  - 2 Score 0 0 0 0 -  Some recent data might be hidden     Fall Risk  02/08/2020 09/12/2019 03/10/2019 08/05/2018 06/16/2018  Falls in the past year? 1 0 1 1 0  Comment broke toes and ankle/ fell at home - - - -  Number falls in past yr: 0 0 1 1 -  Injury with Fall? 1 0 1 0 -  Risk for fall due to : - - History of fall(s);Orthopedic patient Impaired balance/gait -  Follow up Falls evaluation completed;Education provided - - - -      PHYSICAL EXAM: BP 128/76 Comment: taken from a previous visit / not in clinic  Ht 5\' 2"  (1.575 m)   Wt 174 lb (78.9 kg)   BMI 31.83 kg/m    Wt Readings from Last 3 Encounters:  02/08/20 174 lb (78.9 kg)  11/14/19 174 lb 8 oz (79.2 kg)  09/12/19 173 lb (78.5 kg)       Education/Counseling provided regarding diet and exercise, prevention of chronic diseases, smoking/tobacco cessation, if applicable, and reviewed "Covered Medicare Preventive Services."

## 2020-02-24 ENCOUNTER — Other Ambulatory Visit: Payer: Self-pay | Admitting: Gastroenterology

## 2020-02-24 DIAGNOSIS — K219 Gastro-esophageal reflux disease without esophagitis: Secondary | ICD-10-CM

## 2020-02-29 ENCOUNTER — Telehealth: Payer: Self-pay

## 2020-02-29 ENCOUNTER — Ambulatory Visit (INDEPENDENT_AMBULATORY_CARE_PROVIDER_SITE_OTHER): Payer: Medicare Other | Admitting: *Deleted

## 2020-02-29 DIAGNOSIS — I255 Ischemic cardiomyopathy: Secondary | ICD-10-CM | POA: Diagnosis not present

## 2020-02-29 NOTE — Telephone Encounter (Signed)
The pt called to get help sending a transmission. I tried to help her but it was unsuccessful. I called tech support and transmission received. I added her to the schedule.

## 2020-03-01 LAB — CUP PACEART REMOTE DEVICE CHECK
Battery Remaining Longevity: 32 mo
Battery Remaining Percentage: 28 %
Battery Voltage: 2.84 V
Brady Statistic RV Percent Paced: 1 %
Date Time Interrogation Session: 20210804155319
HighPow Impedance: 93 Ohm
HighPow Impedance: 93 Ohm
Implantable Lead Implant Date: 20130108
Implantable Lead Location: 753860
Implantable Pulse Generator Implant Date: 20130108
Lead Channel Impedance Value: 480 Ohm
Lead Channel Pacing Threshold Amplitude: 0.75 V
Lead Channel Pacing Threshold Pulse Width: 0.5 ms
Lead Channel Sensing Intrinsic Amplitude: 12 mV
Lead Channel Setting Pacing Amplitude: 2.5 V
Lead Channel Setting Pacing Pulse Width: 0.5 ms
Lead Channel Setting Sensing Sensitivity: 0.5 mV
Pulse Gen Serial Number: 1016523

## 2020-03-01 NOTE — Progress Notes (Signed)
Remote ICD transmission.   

## 2020-03-12 ENCOUNTER — Other Ambulatory Visit: Payer: Self-pay

## 2020-03-12 ENCOUNTER — Encounter: Payer: Self-pay | Admitting: Family Medicine

## 2020-03-12 ENCOUNTER — Ambulatory Visit (INDEPENDENT_AMBULATORY_CARE_PROVIDER_SITE_OTHER): Payer: Medicare Other | Admitting: Family Medicine

## 2020-03-12 ENCOUNTER — Ambulatory Visit (INDEPENDENT_AMBULATORY_CARE_PROVIDER_SITE_OTHER): Payer: Medicare Other

## 2020-03-12 VITALS — BP 155/78 | HR 100 | Temp 98.5°F | Ht 62.0 in | Wt 180.0 lb

## 2020-03-12 DIAGNOSIS — I10 Essential (primary) hypertension: Secondary | ICD-10-CM | POA: Diagnosis not present

## 2020-03-12 DIAGNOSIS — M25531 Pain in right wrist: Secondary | ICD-10-CM | POA: Diagnosis not present

## 2020-03-12 DIAGNOSIS — S6991XA Unspecified injury of right wrist, hand and finger(s), initial encounter: Secondary | ICD-10-CM | POA: Diagnosis not present

## 2020-03-12 DIAGNOSIS — M79601 Pain in right arm: Secondary | ICD-10-CM | POA: Diagnosis not present

## 2020-03-12 DIAGNOSIS — R7303 Prediabetes: Secondary | ICD-10-CM

## 2020-03-12 DIAGNOSIS — E785 Hyperlipidemia, unspecified: Secondary | ICD-10-CM | POA: Diagnosis not present

## 2020-03-12 DIAGNOSIS — S59911A Unspecified injury of right forearm, initial encounter: Secondary | ICD-10-CM | POA: Diagnosis not present

## 2020-03-12 DIAGNOSIS — M7989 Other specified soft tissue disorders: Secondary | ICD-10-CM | POA: Diagnosis not present

## 2020-03-12 MED ORDER — ONDANSETRON 8 MG PO TBDP: 8.0000 mg | ORAL_TABLET | Freq: Three times a day (TID) | ORAL | 2 refills | Status: DC | PRN

## 2020-03-12 MED ORDER — NAPROXEN 500 MG PO TBEC: 500.0000 mg | DELAYED_RELEASE_TABLET | Freq: Two times a day (BID) | ORAL | 0 refills | Status: DC | PRN

## 2020-03-12 MED ORDER — HYDROCODONE-ACETAMINOPHEN 5-325 MG PO TABS: 1.0000 | ORAL_TABLET | Freq: Four times a day (QID) | ORAL | 0 refills | Status: DC | PRN

## 2020-03-12 NOTE — Patient Instructions (Addendum)
  Rest and elevate the affected painful area.  Apply cold compresses intermittently as needed.  As pain recedes, begin normal activities slowly as tolerated.     If you have lab work done today you will be contacted with your lab results within the next 2 weeks.  If you have not heard from [place] then please contact us. The fastest way to get your results is to register for My Chart.   IF you received an x-ray today, you will receive an invoice from [facility]. Please contact [facility] at [phone] with questions or concerns regarding your invoice.   IF you received labwork today, you will receive an invoice from [facility]. Please contact [facility] at [phone] with questions or concerns regarding your invoice.   Our billing staff will not be able to assist you with questions regarding bills from these companies.  You will be contacted with the lab results as soon as they are available. The fastest way to get your results is to activate your My Chart account. Instructions are located on the last page of this paperwork. If you have not heard from [place] regarding the results in 2 weeks, please contact this office.

## 2020-03-12 NOTE — Progress Notes (Signed)
8/16/20211:32 PM  [name] [birth year], 74 y.o., female [id number]  Chief Complaint  Patient presents with  . Follow-up    ht, hlp, sob, pre dm  . Fall    fell yesterday in the rain, hurt right arm. Requesting imaging. Taking tylenol for the pain    HPI:   Patient is a 74 y.o. female with past medical history significant for CAD, CHF, PAD, HTN, HLP, IBS, GERD, migraines, CKD 3, COPD, grief, prediabeteswho presents today for followup  Last OV feb 2021 - no changes  Patient reports yesterday she that she slipped in the rain landing onto her hands She has right forearm and wrist pain with swelling Using a sling She did not hit her head, she did not LOC  Saw neuro for migraines in April 2021, having increased migraines, no changes due to patient preference, fu 1 year  Reports she took her BP meds today Just having increased pain since fall She does not check her BP at home    Depression screen [facility] 2/9 02/08/2020 09/12/2019 12/18/2017  Decreased Interest 0 0 0  Down, Depressed, Hopeless 0 0 0  PHQ - 2 Score 0 0 0  Some recent data might be hidden    Fall Risk  03/12/2020 02/08/2020 09/12/2019 03/10/2019 08/05/2018  Falls in the past year? 1 1 0 1 1  Comment - broke toes and ankle/ fell at home - - -  Number falls in past yr: 0 0 0 1 1  Injury with Fall? 1 1 0 1 0  Risk for fall due to : - - - History of fall(s);Orthopedic patient Impaired balance/gait  Follow up - Falls evaluation completed;Education provided - - -     Allergies  Allergen Reactions  . Lisinopril     cough  . Sulfa Antibiotics Other (See Comments)    Unknown, childhood allergy   . Sulfonamide Derivatives Other (See Comments)    UNSURE    Prior to Admission medications   Medication Sig Start Date End Date Taking? Authorizing Provider  amitriptyline (ELAVIL) 50 MG tablet Take 1 tablet (50 mg total) by mouth at bedtime. 09/12/19  Yes Rutherford Guys, MD  aspirin 81 MG tablet Take 81 mg by mouth  every morning.    Yes [provider]  bisoprolol (ZEBETA) 5 MG tablet TAKE 1 TABLET BY MOUTH DAILY. 03/29/19  Yes [name], MD  butalbital-acetaminophen-caffeine (FIORICET) [phone] MG tablet Take 1 tablet by mouth every 6 (six) hours as needed for headache. Good for 90 days 11/14/19  Yes Marcial Pacas, MD  clonazePAM (KLONOPIN) 0.5 MG tablet Take 2 tablets (1 mg total) by mouth at bedtime as needed for anxiety. Good for 90 days 11/14/19  Yes Marcial Pacas, MD  dexlansoprazole (DEXILANT) 60 MG capsule Take 1 capsule (60 mg total) by mouth daily. Please schedule an OV for further refills.Thank you 12/27/19  Yes Armbruster, Carlota Raspberry, MD  famotidine (PEPCID) 20 MG tablet Take 1 tablet (20 mg total) by mouth 2 (two) times daily. Please schedule a yearly follow up for further refills. Thank you. 01/23/20  Yes Armbruster, Carlota Raspberry, MD  furosemide (LASIX) 20 MG tablet Take 2 tablets (40 mg total) by mouth daily. 05/12/19  Yes Weaver, Scott T, PA-C  nitroGLYCERIN (NITROSTAT) 0.4 MG SL tablet Place 1 tablet (0.4 mg total) under the tongue every 5 (five) minutes as needed for chest pain. 03/29/19 03/28/20 Yes Weaver, Scott T, PA-C  polyethylene glycol powder (GLYCOLAX/MIRALAX) powder Take 17  g by mouth daily as needed for mild constipation. 07/08/17  Yes Rutherford Guys, MD  PREMARIN vaginal cream  05/25/18  Yes [provider]  PROAIR HFA 108 (90 Base) MCG/ACT inhaler INHALE 2 PUFFS INTO THE LUNGS EVERY 6 HOURS AS NEEDED FOR SHORTNESS OF BREATH 12/27/19  Yes Rutherford Guys, MD  rosuvastatin (CRESTOR) 20 MG tablet TAKE 1 TABLET BY MOUTH AT BEDTIME. 03/01/19  Yes [name], MD  sacubitril-valsartan (ENTRESTO) 49-51 MG Take 1 tablet by mouth 2 (two) times daily. 01/26/20  Yes Weaver, Scott T, PA-C  topiramate (TOPAMAX) 50 MG tablet Take 1 tablet (50 mg total) by mouth daily. 11/14/19  Yes Marcial Pacas, MD    Past Medical History:  Diagnosis Date  . Abdominal pain, left lower quadrant 08/14/2009    Qualifier: Diagnosis of  By: Laney Potash, Pam    . Abnormal CT scan, stomach   . Abnormal LFTs 06/19/2017  . AICD (automatic cardioverter/defibrillator) present    Dr.Allred follows  . Aortic arch pseudoaneurysm (HCC)    a. followed by Dr. Prescott Gum.  . Arthritis   . Asthma   . Benign neoplasm of colon   . CAD (coronary artery disease) 02/2009   a. anterior STEMI rx with BMS to prox LAD in 02/2009. b. ISR s/p PTCA 06/2009. c. ISR s/p thrombectomy & PTCA 03/2010 due to late stent thrombosis. // Myoview 04/2019: EF 28, ant, ant-sept, inf-sept scar, no ischemia, high risk (stable>>cont med Rx)   . Cardiomyopathy, ischemic 06/12/2011  . Chronic renal insufficiency    stage 3  . Chronic systolic CHF (congestive heart failure) ([place])    a. s/p ST. Jude ICD October 26, 2011.  Marland Kitchen Chronic systolic heart failure ([place]) 08/05/2011  . Colon polyp   . Complication of anesthesia   . COPD (chronic obstructive pulmonary disease) ([place])   . CORONARY ARTERY DISEASE 04/24/2009   Qualifier: Diagnosis of  By: Trellis Paganini PA-c, Amy S   . DDD (degenerative disc disease), lumbar 05/16/2011  . Depression    "since my son died in 11-26-2022" 2017-10-25)  . Diverticulosis   . DIVERTICULOSIS-COLON 08/14/2009   Qualifier: Diagnosis of  By: Trellis Paganini PA-c, Amy S   . DYSPHAGIA UNSPECIFIED 04/24/2009   Qualifier: Diagnosis of  By: Laney Potash, Pam    . Dyspnea    "when I lay down at night"   . Essential hypertension 09/05/2009   Qualifier: Diagnosis of  By: Harvest Dark CMA, Anderson Malta    . Gastric polyp   . GERD 10/19/2007   Qualifier: Diagnosis of  By: Rennie Plowman RN, Blanca Friend: Diagnosis of  By: Laney Potash, Pam    . GERD (gastroesophageal reflux disease)   . GI bleed    a. h/o GIB on DAPT, now on ASA only.  Marland Kitchen Headache 01/03/2013  . Hearing loss    left ear  . History of pulmonary embolus (PE) 01/02/2011  . Hyperlipemia   . Hyperlipidemia, unspecified 04/24/2009   Qualifier: Diagnosis of  By: Trellis Paganini PA-c, Amy S   . Hypertension   .  ICD-St.Jude 08/06/2011   S/p St. Jude ICD placement 08/05/11   . Insomnia   . Irritable bowel syndrome   . Ischemic cardiomyopathy    EF 10-15%  . Memory loss   . Migraine headache without aura   . Myocardial infarct [facility]) 10/25/09 x 2   Dr. Roswell Nickel   . PERSONAL HX COLONIC POLYPS 04/24/2009   Qualifier: Diagnosis of  By: Leotis Pain November, 2011  colonoscopy demonstrated a sessile cecal polyp and sigmoid polyp   . Pneumonia   . PONV (postoperative nausea and vomiting)   . Pre-diabetes   . Pseudoaneurysm of aortic arch ([place]) 05/05/2012  . Pulmonary embolism ([place]) 2011  . Stroke [facility])    'When I was young." no residual  . Thoracic aortic aneurysm ([place]) 03/31/2018  . TOBACCO ABUSE 04/04/2009   Qualifier: Diagnosis of  By: Arvid Right  Qualifier: Diagnosis of  By: [name], MD, Jaquelyn Bitter   . Transfusion history    ?'12 or '13  . UNSPECIFIED ANEMIA 07/03/2010   Qualifier: Diagnosis of  By: Shelda Pal    . Unspecified mastoiditis     Past Surgical History:  Procedure Laterality Date  . ANGIOPLASTY  07/02/09, 04/01/10  . BILATERAL SALPINGOOPHORECTOMY    . CAD( bare metal stent)  02/2009   x 1  . CAROTID-SUBCLAVIAN BYPASS GRAFT Left 03/31/2018   Procedure: LEFT SUBCLAVIAN ARTERY BYPASS GRAFT;  Surgeon: Serafina Mitchell, MD;  Location: [place];  Service: Vascular;  Laterality: Left;  . CERVICAL SPINE SURGERY  08/08  . COLONOSCOPY WITH PROPOFOL N/A 04/29/2016   Procedure: COLONOSCOPY WITH PROPOFOL;  Surgeon: [name], MD;  Location: [facility];  Service: Gastroenterology;  Laterality: N/A;  . ESOPHAGOGASTRODUODENOSCOPY N/A 12/09/2016   Procedure: ESOPHAGOGASTRODUODENOSCOPY (EGD);  Surgeon: [name], MD;  Location: [facility];  Service: Gastroenterology;  Laterality: N/A;  . ESOPHAGOGASTRODUODENOSCOPY (EGD) WITH PROPOFOL N/A 04/29/2016   Procedure: ESOPHAGOGASTRODUODENOSCOPY (EGD) WITH PROPOFOL;  Surgeon: [name],  MD;  Location: [facility];  Service: Gastroenterology;  Laterality: N/A;  . EUS N/A 05/15/2016   Procedure: UPPER ENDOSCOPIC ULTRASOUND (EUS) RADIAL;  Surgeon: Milus Banister, MD;  Location: [facility];  Service: Endoscopy;  Laterality: N/A;  . IMPLANTABLE CARDIOVERTER DEFIBRILLATOR IMPLANT N/A 08/05/2011   Primary prevention SJM ICD implanted,  Analyze ST study patient  . INNER EAR SURGERY     left x 17  . LUMBAR DISC SURGERY  02/2008   fusion  . NASAL SEPTUM SURGERY    . THORACIC AORTIC ENDOVASCULAR STENT GRAFT N/A 03/31/2018   Procedure: THORACIC AORTIC ENDOVASCULAR STENT GRAFT;  Surgeon: Serafina Mitchell, MD;  Location: [facility];  Service: Vascular;  Laterality: N/A;  . TOTAL ABDOMINAL HYSTERECTOMY     complete    Social History   Tobacco Use  . Smoking status: Current Some Day Smoker    Packs/day: 0.13    Years: 30.00    Pack years: 3.90    Types: Cigarettes  . Smokeless tobacco: Never Used  . Tobacco comment: Restarted smoking.  few cig. daily  Substance Use Topics  . Alcohol use: No    Alcohol/week: 0.0 standard drinks    Family History  Problem Relation Age of Onset  . Colon cancer Mother   . Colon cancer Brother   . Bladder Cancer Brother   . Breast cancer Cousin   . Cancer Son     Review of Systems  Constitutional: Negative for chills and fever.  Respiratory: Negative for cough and shortness of breath.   Cardiovascular: Negative for chest pain, palpitations and leg swelling.  Gastrointestinal: Positive for nausea. Negative for abdominal pain and vomiting.    per hpi  OBJECTIVE:  Today's Vitals   03/12/20 1328 03/12/20 1426  BP: (!) 175/105 (!) 155/78  Pulse: 100   Temp: 98.5 F (36.9 C)   SpO2: 96%   Weight: 180 lb (81.6 kg)   Height: 5'  2" (1.575 m)    Body mass index is 32.92 kg/m.  BP Readings from Last 3 Encounters:  03/12/20 (!) 175/105  02/08/20 128/76  11/14/19 128/76    Physical Exam Vitals and nursing note reviewed.    Constitutional:      Appearance: She is well-developed.  HENT:     Head: Normocephalic and atraumatic.     Mouth/Throat:     Pharynx: No oropharyngeal exudate.  Eyes:     General: No scleral icterus.    Extraocular Movements: Extraocular movements intact.     Conjunctiva/sclera: Conjunctivae normal.     Pupils: Pupils are equal, round, and reactive to light.  Cardiovascular:     Rate and Rhythm: Normal rate and regular rhythm.     Heart sounds: Normal heart sounds. No murmur heard.  No friction rub. No gallop.   Pulmonary:     Effort: Pulmonary effort is normal.     Breath sounds: Normal breath sounds. No wheezing, rhonchi or rales.  Musculoskeletal:     Right forearm: Swelling and tenderness present. No deformity, lacerations or bony tenderness.     Left forearm: Normal.     Right wrist: Swelling and tenderness present. No deformity, lacerations or bony tenderness. Normal range of motion. Normal pulse.     Left wrist: Normal.     Cervical back: Neck supple.     Right lower leg: No edema.     Left lower leg: No edema.     Comments: RUE distally NVI  Skin:    General: Skin is warm and dry.  Neurological:     Mental Status: She is alert and oriented to person, place, and time.     No results found for this or any previous visit (from the past 24 hour(s)).  DG Forearm Right  Result Date: 03/12/2020 CLINICAL DATA:  [place] yesterday.  Forearm pain and swelling. EXAM: RIGHT FOREARM - 2 VIEW COMPARISON:  Radiographs 07/26/2010 FINDINGS: Remote healed radial head fracture noted. Mild degenerative changes at the wrist and elbow. No acute forearm fractures are identified. IMPRESSION: 1. No acute forearm fractures. 2. Remote healed radial head fracture. Electronically Signed   By: Marijo Sanes M.D.   On: 03/12/2020 14:02   DG Wrist Complete Right  Result Date: 03/12/2020 CLINICAL DATA:  [place] yesterday.  Right wrist pain. EXAM: RIGHT WRIST - COMPLETE 3+ VIEW COMPARISON:  None.  FINDINGS: Mild degenerative changes involving the wrist and CMC joint of the thumb. No acute wrist fracture is identified. IMPRESSION: No acute wrist fracture. Electronically Signed   By: Marijo Sanes M.D.   On: 03/12/2020 14:05     ASSESSMENT and PLAN  1. Essential hypertension Above goal in setting of acute pain, previously well controlled. Return for nurse BP check. ER precautions given  2. Pre-diabetes - Hemoglobin A1c  3. Hyperlipidemia, unspecified hyperlipidemia type Checking labs today, medications will be adjusted as needed.  - Comprehensive metabolic panel - Lipid panel  4. Right arm pain No fracture. Discussed rice therapy. New meds reviewed r/se/b, pmp reviewed.  - DG Forearm Right - DG Wrist Complete Right  Other orders - HYDROcodone-acetaminophen (NORCO/VICODIN) 5-325 MG tablet; Take 1 tablet by mouth every 6 (six) hours as needed for moderate pain. - ondansetron (ZOFRAN-ODT) 8 MG disintegrating tablet; Take 1 tablet (8 mg total) by mouth every 8 (eight) hours as needed for nausea. - naproxen (EC NAPROSYN) 500 MG EC tablet; Take 1 tablet (500 mg total) by mouth 2 (two) times daily as  needed (take with food for mild or moderate).  Advised patient to discuss migraines with neuro  Return in about 3 months (around 06/12/2020).    Rutherford Guys, MD Primary Care at [place], [place] [address] Ph.  [phone] Fax [phone]

## 2020-03-13 ENCOUNTER — Encounter: Payer: Self-pay | Admitting: Family Medicine

## 2020-03-13 LAB — COMPREHENSIVE METABOLIC PANEL
ALT: 16 IU/L (ref 0–32)
AST: 16 IU/L (ref 0–40)
Albumin/Globulin Ratio: 1.9 (ref 1.2–2.2)
Albumin: 4.7 g/dL (ref 3.7–4.7)
Alkaline Phosphatase: 110 IU/L (ref 48–121)
BUN/Creatinine Ratio: 12 (ref 12–28)
BUN: 17 mg/dL (ref 8–27)
Bilirubin Total: 0.2 mg/dL (ref 0.0–1.2)
CO2: 16 mmol/L — ABNORMAL LOW (ref 20–29)
Calcium: 8.9 mg/dL (ref 8.7–10.3)
Chloride: 112 mmol/L — ABNORMAL HIGH (ref 96–106)
Creatinine, Ser: 1.37 mg/dL — ABNORMAL HIGH (ref 0.57–1.00)
GFR calc Af Amer: 44 mL/min/{1.73_m2} — ABNORMAL LOW (ref >59)
GFR calc non Af Amer: 39 mL/min/{1.73_m2} — ABNORMAL LOW (ref >59)
Globulin, Total: 2.5 g/dL (ref 1.5–4.5)
Glucose: 102 mg/dL — ABNORMAL HIGH (ref 65–99)
Potassium: 4.5 mmol/L (ref 3.5–5.2)
Sodium: 142 mmol/L (ref 134–144)
Total Protein: 7.2 g/dL (ref 6.0–8.5)

## 2020-03-13 LAB — LIPID PANEL
Chol/HDL Ratio: 3.2 ratio (ref 0.0–4.4)
Cholesterol, Total: 203 mg/dL — ABNORMAL HIGH (ref 100–199)
HDL: 64 mg/dL (ref >39)
LDL Chol Calc (NIH): 118 mg/dL — ABNORMAL HIGH (ref 0–99)
Triglycerides: 122 mg/dL (ref 0–149)
VLDL Cholesterol Cal: 21 mg/dL (ref 5–40)

## 2020-03-13 LAB — HEMOGLOBIN A1C
Est. average glucose Bld gHb Est-mCnc: 126 mg/dL
Hgb A1c MFr Bld: 6 % — ABNORMAL HIGH (ref 4.8–5.6)

## 2020-03-14 ENCOUNTER — Telehealth: Payer: Self-pay | Admitting: Family Medicine

## 2020-03-14 NOTE — Telephone Encounter (Signed)
Pt pharmacy called and requesting alternative to Naproxen for pt due to cost, they suggested Voltaren 75 mg

## 2020-03-14 NOTE — Telephone Encounter (Signed)
[facility]  calling asking for alternativr on  naproxen (EC NAPROSYN) 500 MG EC tablet [[id number] Too expensive for [pt  They suggest voltaren 75 mg    Please advise   [place], [place]  [address] [place] [address]  Phone:  [phone] Fax:  [phone]

## 2020-03-15 ENCOUNTER — Ambulatory Visit (INDEPENDENT_AMBULATORY_CARE_PROVIDER_SITE_OTHER): Payer: Medicare Other | Admitting: Family Medicine

## 2020-03-15 ENCOUNTER — Other Ambulatory Visit: Payer: Self-pay

## 2020-03-15 VITALS — BP 122/77

## 2020-03-15 DIAGNOSIS — I10 Essential (primary) hypertension: Secondary | ICD-10-CM

## 2020-03-15 NOTE — Telephone Encounter (Signed)
Naproxen is over the counter. Deny diclofenac due to increased GI risks.

## 2020-03-15 NOTE — Patient Instructions (Signed)
° ° ° °  If you have lab work done today you will be contacted with your lab results within the next 2 weeks.  If you have not heard from us then please contact us. The fastest way to get your results is to register for My Chart. ° ° °IF you received an x-ray today, you will receive an invoice from [facility]. Please contact [facility] at [phone] with questions or concerns regarding your invoice.  ° °IF you received labwork today, you will receive an invoice from [facility]. Please contact [facility] at [phone] with questions or concerns regarding your invoice.  ° °Our billing staff will not be able to assist you with questions regarding bills from these companies. ° °You will be contacted with the lab results as soon as they are available. The fastest way to get your results is to activate your My Chart account. Instructions are located on the last page of this paperwork. If you have not heard from us regarding the results in 2 weeks, please contact this office. °  ° ° ° °

## 2020-03-15 NOTE — Progress Notes (Signed)
Pt here today for repeat BP check 122/77 much improved from previous visit

## 2020-03-15 NOTE — Telephone Encounter (Signed)
Pharmacy was informed patient can take over the counter Naproxen and deny diclofenac due to Gi risks

## 2020-04-06 ENCOUNTER — Other Ambulatory Visit: Payer: Self-pay

## 2020-04-06 ENCOUNTER — Ambulatory Visit (INDEPENDENT_AMBULATORY_CARE_PROVIDER_SITE_OTHER): Payer: Medicare Other | Admitting: Family Medicine

## 2020-04-06 ENCOUNTER — Encounter: Payer: Self-pay | Admitting: Family Medicine

## 2020-04-06 ENCOUNTER — Ambulatory Visit (INDEPENDENT_AMBULATORY_CARE_PROVIDER_SITE_OTHER): Payer: Medicare Other

## 2020-04-06 VITALS — BP 152/94 | HR 104 | Temp 97.3°F | Ht 62.0 in | Wt 174.0 lb

## 2020-04-06 DIAGNOSIS — R197 Diarrhea, unspecified: Secondary | ICD-10-CM

## 2020-04-06 DIAGNOSIS — Z23 Encounter for immunization: Secondary | ICD-10-CM | POA: Diagnosis not present

## 2020-04-06 DIAGNOSIS — S63501D Unspecified sprain of right wrist, subsequent encounter: Secondary | ICD-10-CM

## 2020-04-06 MED ORDER — AZITHROMYCIN 500 MG PO TABS: 500.0000 mg | ORAL_TABLET | Freq: Every day | ORAL | 0 refills | Status: DC

## 2020-04-06 NOTE — Progress Notes (Signed)
9/10/20214:47 PM  [name] [birth year], 74 y.o., female [id number]  Chief Complaint  Patient presents with  . Abdominal Pain    diarrhea, one time vomit last week and nausea x 3 weeks  . R arm pain    since fall x 3 weeks ago    HPI:   Patient is a 74 y.o. female with past medical history significant for CAD, CHF, PAD, HTN, HLP, IBS, GERD, migraines, CKD 3, COPD, prediabetes who presents today for abd pain, diarrhea and vomiting  Right arm xrays no fracture on aug 16th Continues to be very tender across her wrist She has been icing, and resting but not using bracing  2 weeks Having generalized abd pain and now diarrhea, 3-4 x day She has been having problems with stool incontinence No fever or chills, no blood Nauseous and vomiting one day last week She has chronic constipation No sick contacts  Depression screen [facility] 2/9 04/06/2020 02/08/2020 09/12/2019  Decreased Interest 0 0 0  Down, Depressed, Hopeless 0 0 0  PHQ - 2 Score 0 0 0  Some recent data might be hidden    Fall Risk  04/06/2020 03/12/2020 02/08/2020 09/12/2019 03/10/2019  Falls in the past year? 0 1 1 0 1  Comment - - broke toes and ankle/ fell at home - -  Number falls in past yr: 0 0 0 0 1  Injury with Fall? 0 1 1 0 1  Risk for fall due to : - - - - History of fall(s);Orthopedic patient  Follow up Falls evaluation completed - Falls evaluation completed;Education provided - -     Allergies  Allergen Reactions  . Lisinopril     cough  . Sulfa Antibiotics Other (See Comments)    Unknown, childhood allergy   . Sulfonamide Derivatives Other (See Comments)    UNSURE    Prior to Admission medications   Medication Sig Start Date End Date Taking? Authorizing Provider  amitriptyline (ELAVIL) 50 MG tablet Take 1 tablet (50 mg total) by mouth at bedtime. 09/12/19  Yes Rutherford Guys, MD  aspirin 81 MG tablet Take 81 mg by mouth every morning.    Yes [provider]  bisoprolol (ZEBETA) 5 MG  tablet TAKE 1 TABLET BY MOUTH DAILY. 03/29/19  Yes [name], MD  dexlansoprazole (DEXILANT) 60 MG capsule Take 1 capsule (60 mg total) by mouth daily. Please schedule an OV for further refills.Thank you 12/27/19  Yes Armbruster, Carlota Raspberry, MD  famotidine (PEPCID) 20 MG tablet Take 1 tablet (20 mg total) by mouth 2 (two) times daily. Please schedule a yearly follow up for further refills. Thank you. 01/23/20  Yes Armbruster, Carlota Raspberry, MD  furosemide (LASIX) 20 MG tablet Take 2 tablets (40 mg total) by mouth daily. 05/12/19  Yes Weaver, Scott T, PA-C  naproxen (EC NAPROSYN) 500 MG EC tablet Take 1 tablet (500 mg total) by mouth 2 (two) times daily as needed (take with food for mild or moderate). 03/12/20  Yes Rutherford Guys, MD  polyethylene glycol powder (GLYCOLAX/MIRALAX) powder Take 17 g by mouth daily as needed for mild constipation. 07/08/17  Yes Rutherford Guys, MD  PREMARIN vaginal cream  05/25/18  Yes [provider]  PROAIR HFA 108 (90 Base) MCG/ACT inhaler INHALE 2 PUFFS INTO THE LUNGS EVERY 6 HOURS AS NEEDED FOR SHORTNESS OF BREATH 12/27/19  Yes Rutherford Guys, MD  rosuvastatin (CRESTOR) 20 MG tablet TAKE 1 TABLET BY MOUTH AT BEDTIME. 03/01/19  Yes [name], MD  sacubitril-valsartan (ENTRESTO) 49-51 MG Take 1 tablet by mouth 2 (two) times daily. 01/26/20  Yes Weaver, Scott T, PA-C  topiramate (TOPAMAX) 50 MG tablet Take 1 tablet (50 mg total) by mouth daily. 11/14/19  Yes Marcial Pacas, MD  HYDROcodone-acetaminophen (NORCO/VICODIN) 5-325 MG tablet Take 1 tablet by mouth every 6 (six) hours as needed for moderate pain. Patient not taking: Reported on 04/06/2020 03/12/20   Rutherford Guys, MD  nitroGLYCERIN (NITROSTAT) 0.4 MG SL tablet Place 1 tablet (0.4 mg total) under the tongue every 5 (five) minutes as needed for chest pain. 03/29/19 03/28/20  Richardson Dopp T, PA-C  ondansetron (ZOFRAN-ODT) 8 MG disintegrating tablet Take 1 tablet (8 mg total) by mouth every 8 (eight) hours as needed  for nausea. Patient not taking: Reported on 04/06/2020 03/12/20   Rutherford Guys, MD    Past Medical History:  Diagnosis Date  . Abdominal pain, left lower quadrant 08/14/2009   Qualifier: Diagnosis of  By: Laney Potash, Pam    . Abnormal CT scan, stomach   . Abnormal LFTs 06/19/2017  . AICD (automatic cardioverter/defibrillator) present    Dr.Allred follows  . Aortic arch pseudoaneurysm (HCC)    a. followed by Dr. Prescott Gum.  . Arthritis   . Asthma   . Benign neoplasm of colon   . CAD (coronary artery disease) 02/2009   a. anterior STEMI rx with BMS to prox LAD in 02/2009. b. ISR s/p PTCA 06/2009. c. ISR s/p thrombectomy & PTCA 03/2010 due to late stent thrombosis. // Myoview 04/2019: EF 28, ant, ant-sept, inf-sept scar, no ischemia, high risk (stable>>cont med Rx)   . Cardiomyopathy, ischemic 06/12/2011  . Chronic renal insufficiency    stage 3  . Chronic systolic CHF (congestive heart failure) ([place])    a. s/p ST. Jude ICD 11-09-11.  Marland Kitchen Chronic systolic heart failure ([place]) 08/05/2011  . Colon polyp   . Complication of anesthesia   . COPD (chronic obstructive pulmonary disease) ([place])   . CORONARY ARTERY DISEASE 04/24/2009   Qualifier: Diagnosis of  By: Trellis Paganini PA-c, Amy S   . DDD (degenerative disc disease), lumbar 05/16/2011  . Depression    "since my son died in 12/10/2022" 11/08/2017)  . Diverticulosis   . DIVERTICULOSIS-COLON 08/14/2009   Qualifier: Diagnosis of  By: Trellis Paganini PA-c, Amy S   . DYSPHAGIA UNSPECIFIED 04/24/2009   Qualifier: Diagnosis of  By: Laney Potash, Pam    . Dyspnea    "when I lay down at night"   . Essential hypertension 09/05/2009   Qualifier: Diagnosis of  By: Harvest Dark CMA, Anderson Malta    . Gastric polyp   . GERD 10/19/2007   Qualifier: Diagnosis of  By: Rennie Plowman RN, Blanca Friend: Diagnosis of  By: Laney Potash, Pam    . GERD (gastroesophageal reflux disease)   . GI bleed    a. h/o GIB on DAPT, now on ASA only.  Marland Kitchen Headache 01/03/2013  . Hearing loss    left ear    . History of pulmonary embolus (PE) 01/02/2011  . Hyperlipemia   . Hyperlipidemia, unspecified 04/24/2009   Qualifier: Diagnosis of  By: Trellis Paganini PA-c, Amy S   . Hypertension   . ICD-St.Jude 08/06/2011   S/p St. Jude ICD placement 08/05/11   . Insomnia   . Irritable bowel syndrome   . Ischemic cardiomyopathy    EF 10-15%  . Memory loss   . Migraine headache without aura   . Myocardial  infarct [facility]) 2011 x 2   Dr. Roswell Nickel   . PERSONAL HX COLONIC POLYPS 04/24/2009   Qualifier: Diagnosis of  By: Shane Crutch, Amy S November, 2011 colonoscopy demonstrated a sessile cecal polyp and sigmoid polyp   . Pneumonia   . PONV (postoperative nausea and vomiting)   . Pre-diabetes   . Pseudoaneurysm of aortic arch ([place]) 05/05/2012  . Pulmonary embolism ([place]) 2011  . Stroke [facility])    'When I was young." no residual  . Thoracic aortic aneurysm ([place]) 03/31/2018  . TOBACCO ABUSE 04/04/2009   Qualifier: Diagnosis of  By: Arvid Right  Qualifier: Diagnosis of  By: [name], MD, Jaquelyn Bitter   . Transfusion history    ?'12 or '13  . UNSPECIFIED ANEMIA 07/03/2010   Qualifier: Diagnosis of  By: Shelda Pal    . Unspecified mastoiditis     Past Surgical History:  Procedure Laterality Date  . ANGIOPLASTY  07/02/09, 04/01/10  . BILATERAL SALPINGOOPHORECTOMY    . CAD( bare metal stent)  02/2009   x 1  . CAROTID-SUBCLAVIAN BYPASS GRAFT Left 03/31/2018   Procedure: LEFT SUBCLAVIAN ARTERY BYPASS GRAFT;  Surgeon: Serafina Mitchell, MD;  Location: [place];  Service: Vascular;  Laterality: Left;  . CERVICAL SPINE SURGERY  08/08  . COLONOSCOPY WITH PROPOFOL N/A 04/29/2016   Procedure: COLONOSCOPY WITH PROPOFOL;  Surgeon: [name], MD;  Location: [facility];  Service: Gastroenterology;  Laterality: N/A;  . ESOPHAGOGASTRODUODENOSCOPY N/A 12/09/2016   Procedure: ESOPHAGOGASTRODUODENOSCOPY (EGD);  Surgeon: [name], MD;  Location: [facility];  Service:  Gastroenterology;  Laterality: N/A;  . ESOPHAGOGASTRODUODENOSCOPY (EGD) WITH PROPOFOL N/A 04/29/2016   Procedure: ESOPHAGOGASTRODUODENOSCOPY (EGD) WITH PROPOFOL;  Surgeon: [name], MD;  Location: [facility];  Service: Gastroenterology;  Laterality: N/A;  . EUS N/A 05/15/2016   Procedure: UPPER ENDOSCOPIC ULTRASOUND (EUS) RADIAL;  Surgeon: Milus Banister, MD;  Location: [facility];  Service: Endoscopy;  Laterality: N/A;  . IMPLANTABLE CARDIOVERTER DEFIBRILLATOR IMPLANT N/A 08/05/2011   Primary prevention SJM ICD implanted,  Analyze ST study patient  . INNER EAR SURGERY     left x 17  . LUMBAR DISC SURGERY  02/2008   fusion  . NASAL SEPTUM SURGERY    . THORACIC AORTIC ENDOVASCULAR STENT GRAFT N/A 03/31/2018   Procedure: THORACIC AORTIC ENDOVASCULAR STENT GRAFT;  Surgeon: Serafina Mitchell, MD;  Location: [facility];  Service: Vascular;  Laterality: N/A;  . TOTAL ABDOMINAL HYSTERECTOMY     complete    Social History   Tobacco Use  . Smoking status: Current Some Day Smoker    Packs/day: 0.13    Years: 30.00    Pack years: 3.90    Types: Cigarettes  . Smokeless tobacco: Never Used  . Tobacco comment: Restarted smoking.  few cig. daily  Substance Use Topics  . Alcohol use: No    Alcohol/week: 0.0 standard drinks    Family History  Problem Relation Age of Onset  . Colon cancer Mother   . Colon cancer Brother   . Bladder Cancer Brother   . Breast cancer Cousin   . Cancer Son     ROS Per hpi  OBJECTIVE:  Today's Vitals   04/06/20 1644  BP: (!) 145/91  Pulse: (!) 109  Temp: (!) 97.3 F (36.3 C)  SpO2: 97%  Weight: 174 lb (78.9 kg)  Height: 5\' 2"  (1.575 m)   Body mass index is 31.83 kg/m.   Physical Exam Vitals and nursing note  reviewed.  Constitutional:      Appearance: She is well-developed.  HENT:     Head: Normocephalic and atraumatic.     Mouth/Throat:     Pharynx: No oropharyngeal exudate.  Eyes:     General: No scleral icterus.     Conjunctiva/sclera: Conjunctivae normal.     Pupils: Pupils are equal, round, and reactive to light.  Cardiovascular:     Rate and Rhythm: Normal rate and regular rhythm.     Heart sounds: Normal heart sounds. No murmur heard.  No friction rub. No gallop.   Pulmonary:     Effort: Pulmonary effort is normal.     Breath sounds: Normal breath sounds. No wheezing or rales.  Abdominal:     General: Bowel sounds are increased. There is distension (minimal).     Palpations: Abdomen is soft. There is no hepatomegaly or splenomegaly.     Tenderness: There is abdominal tenderness in the right upper quadrant, right lower quadrant and epigastric area. There is no guarding or rebound. Negative signs include Murphy's sign and McBurney's sign.  Musculoskeletal:     Cervical back: Neck supple.  Skin:    General: Skin is warm and dry.  Neurological:     Mental Status: She is alert and oriented to person, place, and time.     No results found for this or any previous visit (from the past 24 hour(s)).  DG Abd 2 Views  Result Date: 04/06/2020 CLINICAL DATA:  Two week history of diarrhea and incontinence. EXAM: X-RAY ABDOMEN 2 VIEWS COMPARISON:  None. FINDINGS: The lung bases are clear. The abdominal bowel gas pattern is unremarkable. Scattered air in the colon and small bowel but no distension to suggest obstruction. No free air. The soft tissue shadows are maintained. No worrisome calcifications. The bony structures are intact. Lumbar fusion hardware noted. IMPRESSION: No plain film findings for an acute abdominal process. Electronically Signed   By: Marijo Sanes M.D.   On: 04/06/2020 17:27     ASSESSMENT and PLAN  1. Diarrhea, unspecified type Treating empirically with azi, reviewed supportive measures (push fluids and BRAT diet) and RTC precautions - DG Abd 2 Views  2. Sprain of right wrist, subsequent encounter Placed ace bandage, cont with RICE therapy  3. Encounter for immunization - Flu  Vaccine QUAD High Dose(Fluad)  Other orders - azithromycin (ZITHROMAX) 500 MG tablet; Take 1 tablet (500 mg total) by mouth daily.  No follow-ups on file.    Rutherford Guys, MD [facility] [place], [place] [address] Ph.  [phone] Fax [phone]

## 2020-04-06 NOTE — Patient Instructions (Signed)
° ° ° °  If you have lab work done today you will be contacted with your lab results within the next 2 weeks.  If you have not heard from us then please contact us. The fastest way to get your results is to register for My Chart. ° ° °IF you received an x-ray today, you will receive an invoice from [facility]. Please contact [facility] at [phone] with questions or concerns regarding your invoice.  ° °IF you received labwork today, you will receive an invoice from [facility]. Please contact [facility] at [phone] with questions or concerns regarding your invoice.  ° °Our billing staff will not be able to assist you with questions regarding bills from these companies. ° °You will be contacted with the lab results as soon as they are available. The fastest way to get your results is to activate your My Chart account. Instructions are located on the last page of this paperwork. If you have not heard from us regarding the results in 2 weeks, please contact this office. °  ° ° ° °

## 2020-04-11 ENCOUNTER — Other Ambulatory Visit: Payer: Self-pay | Admitting: Cardiovascular Disease

## 2020-04-12 ENCOUNTER — Telehealth: Payer: Self-pay | Admitting: Family Medicine

## 2020-04-12 DIAGNOSIS — R197 Diarrhea, unspecified: Secondary | ICD-10-CM

## 2020-04-12 NOTE — Telephone Encounter (Signed)
Pt called stating that she finished the antibiotic, azithromycin,  that she was given on 04/10/20. She states that after finishing the medication she still has stomach pains. She is requesting to have something else sent in for her. Please advise.      [place], Elias-Fela Solis  [place] [address]  Phone: [phone] Fax: [phone]  Hours: Not open 24 hours

## 2020-04-12 NOTE — Telephone Encounter (Signed)
Pt states finished abx and still having stomach pain and wants something else sent in for her. Please advise action and I can call her

## 2020-04-12 NOTE — Telephone Encounter (Signed)
I have ordered stool tests. If normal, then Iadvise she see her GI doctor. thanks

## 2020-04-12 NOTE — Telephone Encounter (Signed)
I have placed stool kit up fron for pt to pick up and she has been notified.

## 2020-04-12 NOTE — Addendum Note (Signed)
Addended by: Meredeth Ide on: 04/12/2020 03:47 PM   Modules accepted: Orders

## 2020-04-13 DIAGNOSIS — R197 Diarrhea, unspecified: Secondary | ICD-10-CM | POA: Diagnosis not present

## 2020-04-15 LAB — CLOSTRIDIUM DIFFICILE BY PCR: Toxigenic C. Difficile by PCR: NEGATIVE

## 2020-04-17 LAB — STOOL CULTURE: E coli, Shiga toxin Assay: NEGATIVE

## 2020-04-18 LAB — OVA AND PARASITE EXAMINATION

## 2020-04-19 ENCOUNTER — Encounter: Payer: Self-pay | Admitting: Family Medicine

## 2020-04-19 ENCOUNTER — Other Ambulatory Visit: Payer: Self-pay

## 2020-04-19 ENCOUNTER — Telehealth (INDEPENDENT_AMBULATORY_CARE_PROVIDER_SITE_OTHER): Payer: Medicare Other | Admitting: Family Medicine

## 2020-04-19 DIAGNOSIS — R140 Abdominal distension (gaseous): Secondary | ICD-10-CM | POA: Diagnosis not present

## 2020-04-19 DIAGNOSIS — R1013 Epigastric pain: Secondary | ICD-10-CM

## 2020-04-19 MED ORDER — DICYCLOMINE HCL 10 MG PO CAPS: 10.0000 mg | ORAL_CAPSULE | Freq: Three times a day (TID) | ORAL | 0 refills | Status: DC | PRN

## 2020-04-19 NOTE — Progress Notes (Signed)
Virtual Visit Note  I connected with patient on 04/19/20 at 529pm by phone (per patient preference) and verified that I am speaking with the correct person using two identifiers. [name] is currently located at home and patient is currently with them during visit. The provider, Rutherford Guys, MD is located in their office at time of visit.  I discussed the limitations, risks, security and privacy concerns of performing an evaluation and management service by telephone and the availability of in person appointments. I also discussed with the patient that there may be a patient responsible charge related to this service. The patient expressed understanding and agreed to proceed.   I provided 10 minutes of non-face-to-face time during this encounter.  Chief Complaint  Patient presents with  . GI Problem    still having stomach pain, finished antibiotics, having some constipation.     HPI ? H/o IBS w constipation Seen on sept 10 2021 for abd pain, diarrhea and vomiting x 2 weeks tx empirically with azi Stool cx, c diff and o+p negative AXR - normal Colonoscopy in oct 2017, repeat in 10 years GI Dr Havery Moros - last OV jan 2020 Takes protonix and pepcid for gerd, h/o ulcers Takes miralax prn  She continues to have epigastric pain, constant, achy, cramping Nothing makes pain better or worse, not changed with BM She has been taking ibuprofen which is not helping She is bloated and feels her stomach rumbling often Last BM was 2 days ago, it was loose, no blood Vomiting and nausea have resolved Reflux is well controlled with medication No dysphagia appetite is good  Allergies  Allergen Reactions  . Lisinopril     cough  . Sulfa Antibiotics Other (See Comments)    Unknown, childhood allergy   . Sulfonamide Derivatives Other (See Comments)    UNSURE    Prior to Admission medications   Medication Sig Start Date End Date Taking? Authorizing Provider  amitriptyline  (ELAVIL) 50 MG tablet Take 1 tablet (50 mg total) by mouth at bedtime. 09/12/19  Yes Rutherford Guys, MD  aspirin 81 MG tablet Take 81 mg by mouth every morning.    Yes [provider]  azithromycin (ZITHROMAX) 500 MG tablet Take 1 tablet (500 mg total) by mouth daily. 04/06/20  Yes Rutherford Guys, MD  bisoprolol (ZEBETA) 5 MG tablet TAKE 1 TABLET BY MOUTH DAILY. 03/29/19  Yes [name], MD  dexlansoprazole (DEXILANT) 60 MG capsule Take 1 capsule (60 mg total) by mouth daily. Please schedule an OV for further refills.Thank you 12/27/19  Yes Armbruster, Carlota Raspberry, MD  famotidine (PEPCID) 20 MG tablet Take 1 tablet (20 mg total) by mouth 2 (two) times daily. Please schedule a yearly follow up for further refills. Thank you. 01/23/20  Yes Armbruster, Carlota Raspberry, MD  furosemide (LASIX) 20 MG tablet Take 2 tablets (40 mg total) by mouth daily. 05/12/19  Yes Weaver, Scott T, PA-C  HYDROcodone-acetaminophen (NORCO/VICODIN) 5-325 MG tablet Take 1 tablet by mouth every 6 (six) hours as needed for moderate pain. 03/12/20  Yes Rutherford Guys, MD  naproxen (EC NAPROSYN) 500 MG EC tablet Take 1 tablet (500 mg total) by mouth 2 (two) times daily as needed (take with food for mild or moderate). 03/12/20  Yes Rutherford Guys, MD  ondansetron (ZOFRAN-ODT) 8 MG disintegrating tablet Take 1 tablet (8 mg total) by mouth every 8 (eight) hours as needed for nausea. 03/12/20  Yes Rutherford Guys, MD  polyethylene glycol powder (GLYCOLAX/MIRALAX) powder Take 17 g by mouth daily as needed for mild constipation. 07/08/17  Yes Rutherford Guys, MD  PREMARIN vaginal cream  05/25/18  Yes [provider]  PROAIR HFA 108 (90 Base) MCG/ACT inhaler INHALE 2 PUFFS INTO THE LUNGS EVERY 6 HOURS AS NEEDED FOR SHORTNESS OF BREATH 12/27/19  Yes Rutherford Guys, MD  rosuvastatin (CRESTOR) 20 MG tablet TAKE 1 TABLET BY MOUTH AT BEDTIME. 04/11/20  Yes [name], MD  sacubitril-valsartan (ENTRESTO) 49-51 MG Take 1 tablet  by mouth 2 (two) times daily. 01/26/20  Yes Weaver, Scott T, PA-C  topiramate (TOPAMAX) 50 MG tablet Take 1 tablet (50 mg total) by mouth daily. 11/14/19  Yes Marcial Pacas, MD  nitroGLYCERIN (NITROSTAT) 0.4 MG SL tablet Place 1 tablet (0.4 mg total) under the tongue every 5 (five) minutes as needed for chest pain. 03/29/19 03/28/20  Liliane Shi, PA-C    Past Medical History:  Diagnosis Date  . Abdominal pain, left lower quadrant 08/14/2009   Qualifier: Diagnosis of  By: Laney Potash, Pam    . Abnormal CT scan, stomach   . Abnormal LFTs 06/19/2017  . AICD (automatic cardioverter/defibrillator) present    Dr.Allred follows  . Aortic arch pseudoaneurysm (HCC)    a. followed by Dr. Prescott Gum.  . Arthritis   . Asthma   . Benign neoplasm of colon   . CAD (coronary artery disease) 02/2009   a. anterior STEMI rx with BMS to prox LAD in 02/2009. b. ISR s/p PTCA 06/2009. c. ISR s/p thrombectomy & PTCA 03/2010 due to late stent thrombosis. // Myoview 04/2019: EF 28, ant, ant-sept, inf-sept scar, no ischemia, high risk (stable>>cont med Rx)   . Cardiomyopathy, ischemic 06/12/2011  . Chronic renal insufficiency    stage 3  . Chronic systolic CHF (congestive heart failure) ([place])    a. s/p ST. Jude ICD Nov 04, 2011.  Marland Kitchen Chronic systolic heart failure ([place]) 08/05/2011  . Colon polyp   . Complication of anesthesia   . COPD (chronic obstructive pulmonary disease) ([place])   . CORONARY ARTERY DISEASE 04/24/2009   Qualifier: Diagnosis of  By: Trellis Paganini PA-c, Amy S   . DDD (degenerative disc disease), lumbar 05/16/2011  . Depression    "since my son died in 12/05/2022" 2017/11/03)  . Diverticulosis   . DIVERTICULOSIS-COLON 08/14/2009   Qualifier: Diagnosis of  By: Trellis Paganini PA-c, Amy S   . DYSPHAGIA UNSPECIFIED 04/24/2009   Qualifier: Diagnosis of  By: Laney Potash, Pam    . Dyspnea    "when I lay down at night"   . Essential hypertension 09/05/2009   Qualifier: Diagnosis of  By: Harvest Dark CMA, Anderson Malta    . Gastric polyp   .  GERD 10/19/2007   Qualifier: Diagnosis of  By: Rennie Plowman RN, Blanca Friend: Diagnosis of  By: Laney Potash, Pam    . GERD (gastroesophageal reflux disease)   . GI bleed    a. h/o GIB on DAPT, now on ASA only.  Marland Kitchen Headache 01/03/2013  . Hearing loss    left ear  . History of pulmonary embolus (PE) 01/02/2011  . Hyperlipemia   . Hyperlipidemia, unspecified 04/24/2009   Qualifier: Diagnosis of  By: Trellis Paganini PA-c, Amy S   . Hypertension   . ICD-St.Jude 08/06/2011   S/p St. Jude ICD placement 08/05/11   . Insomnia   . Irritable bowel syndrome   . Ischemic cardiomyopathy    EF 10-15%  . Memory loss   .  Migraine headache without aura   . Myocardial infarct [facility]) 2011 x 2   Dr. Roswell Nickel   . PERSONAL HX COLONIC POLYPS 04/24/2009   Qualifier: Diagnosis of  By: Shane Crutch, Amy S November, 2011 colonoscopy demonstrated a sessile cecal polyp and sigmoid polyp   . Pneumonia   . PONV (postoperative nausea and vomiting)   . Pre-diabetes   . Pseudoaneurysm of aortic arch ([place]) 05/05/2012  . Pulmonary embolism ([place]) 2011  . Stroke [facility])    'When I was young." no residual  . Thoracic aortic aneurysm ([place]) 03/31/2018  . TOBACCO ABUSE 04/04/2009   Qualifier: Diagnosis of  By: Arvid Right  Qualifier: Diagnosis of  By: [name], MD, Jaquelyn Bitter   . Transfusion history    ?'12 or '13  . UNSPECIFIED ANEMIA 07/03/2010   Qualifier: Diagnosis of  By: Shelda Pal    . Unspecified mastoiditis     Past Surgical History:  Procedure Laterality Date  . ANGIOPLASTY  07/02/09, 04/01/10  . BILATERAL SALPINGOOPHORECTOMY    . CAD( bare metal stent)  02/2009   x 1  . CAROTID-SUBCLAVIAN BYPASS GRAFT Left 03/31/2018   Procedure: LEFT SUBCLAVIAN ARTERY BYPASS GRAFT;  Surgeon: Serafina Mitchell, MD;  Location: [facility];  Service: Vascular;  Laterality: Left;  . CERVICAL SPINE SURGERY  08/08  . COLONOSCOPY WITH PROPOFOL N/A 04/29/2016   Procedure: COLONOSCOPY WITH PROPOFOL;  Surgeon: [name], MD;  Location: [facility];  Service: Gastroenterology;  Laterality: N/A;  . ESOPHAGOGASTRODUODENOSCOPY N/A 12/09/2016   Procedure: ESOPHAGOGASTRODUODENOSCOPY (EGD);  Surgeon: [name], MD;  Location: [facility];  Service: Gastroenterology;  Laterality: N/A;  . ESOPHAGOGASTRODUODENOSCOPY (EGD) WITH PROPOFOL N/A 04/29/2016   Procedure: ESOPHAGOGASTRODUODENOSCOPY (EGD) WITH PROPOFOL;  Surgeon: [name], MD;  Location: [facility];  Service: Gastroenterology;  Laterality: N/A;  . EUS N/A 05/15/2016   Procedure: UPPER ENDOSCOPIC ULTRASOUND (EUS) RADIAL;  Surgeon: Milus Banister, MD;  Location: [facility];  Service: Endoscopy;  Laterality: N/A;  . IMPLANTABLE CARDIOVERTER DEFIBRILLATOR IMPLANT N/A 08/05/2011   Primary prevention SJM ICD implanted,  Analyze ST study patient  . INNER EAR SURGERY     left x 17  . LUMBAR DISC SURGERY  02/2008   fusion  . NASAL SEPTUM SURGERY    . THORACIC AORTIC ENDOVASCULAR STENT GRAFT N/A 03/31/2018   Procedure: THORACIC AORTIC ENDOVASCULAR STENT GRAFT;  Surgeon: Serafina Mitchell, MD;  Location: [facility];  Service: Vascular;  Laterality: N/A;  . TOTAL ABDOMINAL HYSTERECTOMY     complete    Social History   Tobacco Use  . Smoking status: Current Some Day Smoker    Packs/day: 0.13    Years: 30.00    Pack years: 3.90    Types: Cigarettes  . Smokeless tobacco: Never Used  . Tobacco comment: Restarted smoking.  few cig. daily  Substance Use Topics  . Alcohol use: No    Alcohol/week: 0.0 standard drinks    Family History  Problem Relation Age of Onset  . Colon cancer Mother   . Colon cancer Brother   . Bladder Cancer Brother   . Breast cancer Cousin   . Cancer Son     ROS Per hpi  Objective  Vitals as reported by the patient: none  Gen: aaox3, nad Speaking in full sentences comfortably   ASSESSMENT and PLAN  1. Abdominal pain, epigastric 2. Abdominal bloating Overall GI symptoms resolving, cont pain  and bloating, no red flag signs, discussed use  of OTC probiotics and simethicone. Discussed mgt of constipation. Trial of bentyl to help with cramping. If sx persist advised she see her GI doctor. RTC precautions given  Other orders - dicyclomine (BENTYL) 10 MG capsule; Take 1 capsule (10 mg total) by mouth 3 (three) times daily as needed (for abdominal pain/cramping, take with food).  FOLLOW-UP: prn   The above assessment and management plan was discussed with the patient. The patient verbalized understanding of and has agreed to the management plan. Patient is aware to call the clinic if symptoms persist or worsen. Patient is aware when to return to the clinic for a follow-up visit. Patient educated on when it is appropriate to go to the emergency department.     Rutherford Guys, MD Primary Care at [facility] [place], [place] [address] Ph.  [phone] Fax [phone]

## 2020-04-20 ENCOUNTER — Other Ambulatory Visit: Payer: Self-pay | Admitting: Family Medicine

## 2020-04-20 DIAGNOSIS — G43009 Migraine without aura, not intractable, without status migrainosus: Secondary | ICD-10-CM

## 2020-04-20 NOTE — Telephone Encounter (Signed)
Requested Prescriptions  Pending Prescriptions Disp Refills  . amitriptyline (ELAVIL) 50 MG tablet [Pharmacy Med Name: AMITRIPTYLINE HCL 50 MG TAB 50 Tablet] 90 tablet 1    Sig: TAKE 1 TABLET BY MOUTH AT BEDTIME.     Psychiatry:  Antidepressants - Heterocyclics (TCAs) Passed - 04/20/2020  9:03 AM      Passed - Valid encounter within last 6 months    Recent Outpatient Visits          Yesterday Abdominal pain, epigastric   [facility], Lilia Argue, MD   2 weeks ago Diarrhea, unspecified type   [facility], Lilia Argue, MD   1 month ago Essential hypertension   [facility], Lilia Argue, MD   1 month ago Essential hypertension   [facility], Lilia Argue, MD   7 months ago Pre-diabetes   [facility], Lilia Argue, MD      Future Appointments            In 3 weeks [name], MD [facility] [place]   In 5 months [place], Ines Bloomer, MD [facility] [place],  [facility]

## 2020-05-11 ENCOUNTER — Ambulatory Visit: Payer: Medicare Other | Admitting: Cardiovascular Disease

## 2020-05-21 ENCOUNTER — Other Ambulatory Visit: Payer: Self-pay | Admitting: Cardiovascular Disease

## 2020-05-23 ENCOUNTER — Other Ambulatory Visit: Payer: Self-pay

## 2020-05-23 ENCOUNTER — Encounter: Payer: Self-pay | Admitting: Cardiovascular Disease

## 2020-05-23 ENCOUNTER — Ambulatory Visit (INDEPENDENT_AMBULATORY_CARE_PROVIDER_SITE_OTHER): Payer: Medicare Other | Admitting: Cardiovascular Disease

## 2020-05-23 VITALS — BP 134/90 | HR 81 | Ht 62.0 in | Wt 178.0 lb

## 2020-05-23 DIAGNOSIS — I2510 Atherosclerotic heart disease of native coronary artery without angina pectoris: Secondary | ICD-10-CM

## 2020-05-23 DIAGNOSIS — E782 Mixed hyperlipidemia: Secondary | ICD-10-CM | POA: Diagnosis not present

## 2020-05-23 DIAGNOSIS — I5022 Chronic systolic (congestive) heart failure: Secondary | ICD-10-CM | POA: Diagnosis not present

## 2020-05-23 DIAGNOSIS — I712 Thoracic aortic aneurysm, without rupture, unspecified: Secondary | ICD-10-CM

## 2020-05-23 DIAGNOSIS — N1832 Chronic kidney disease, stage 3b: Secondary | ICD-10-CM | POA: Diagnosis not present

## 2020-05-23 NOTE — Patient Instructions (Signed)
Medication Instructions:  Your provider recommends that you continue on your current medications as directed. Please refer to the Current Medication list given to you today.   *If you need a refill on your cardiac medications before your next appointment, please call your pharmacy*  Testing/Procedures: Your provider has requested that you have an echocardiogram. Echocardiography is a painless test that uses sound waves to create images of your heart. It provides your doctor with information about the size and shape of your heart and how well your heart's chambers and valves are working. This procedure takes approximately one hour. There are no restrictions for this procedure.  Follow-Up: At [facility], you and your health needs are our priority.  As part of our continuing mission to provide you with exceptional heart care, we have created designated Provider Care Teams.  These Care Teams include your primary Cardiologist (physician) and Advanced Practice Providers (APPs -  Physician Assistants and Nurse Practitioners) who all work together to provide you with the care you need, when you need it. Your next appointment:   6 month(s) The format for your next appointment:   In Person Provider:   Scott Weaver, PA-C  

## 2020-05-23 NOTE — Progress Notes (Signed)
Cardiology Office Note:    Date:  05/23/2020   ID:  [name], DOB April 20, 1946, MRN [id number]  PCP:  Rutherford Guys, MD (Inactive)  [facility] Cardiologist:  [name], MD  [facility] Electrophysiologist:  Thompson Grayer, MD   Referring MD: Rutherford Guys, MD   Chief Complaint  Patient presents with   Coronary Artery Disease    History of Present Illness:    [name] is a 74 y.o. female with a hx of:  Coronary artery disease  ? S/p anterior MI in 10/01/08 >> PCI: BMS to prox LAD ? S/p multiple PCIs since MI in 10-01-08 2/2 ISR ? Myoview 04/2019: Lg scar, no ischemia, EF 28  Chronic systolic CHF ? Ischemic CM  S/p AICD  Hx ofpulmonary embolism  COPD  Chronic kidney disease   Prior Gi bleeding   Hypertension   Hyperlipidemia   Thoracic aortic aneurysm ? S/p L carotid-subclavian transposition and endovascular repair of aneurysm 03/2018  [name] is here alone today.  She is doing remarkably well.  She has new chronic dyspnea with exertion that is unchanged over several years.  She denies orthopnea, PND, or leg swelling.  She denies chest pain or chest pressure.  No lightheadedness or syncope.  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 followed by Dr. Prescott Gum.   Arthritis    Asthma    Benign neoplasm of colon    CAD (coronary artery disease) 02/2009   a. anterior STEMI rx with BMS to prox LAD in 02/2009. b. ISR s/p PTCA 06/2009. c. ISR s/p thrombectomy & PTCA 03/2010 due to late stent thrombosis. // Myoview 04/2019: EF 28, ant, ant-sept, inf-sept scar, no ischemia, high risk (stable>>cont med Rx)    Cardiomyopathy, ischemic 06/12/2011   Chronic renal  insufficiency    stage 3   Chronic systolic CHF (congestive heart failure) ([place])    a. s/p ST. Jude ICD 2011/10/02.   Chronic systolic heart failure ([place]) 08/05/2011   Colon polyp    Complication of anesthesia    COPD (chronic obstructive pulmonary disease) ([place])    CORONARY ARTERY DISEASE 04/24/2009   Qualifier: Diagnosis of  By: Shane Crutch, Amy S    DDD (degenerative disc disease), lumbar 05/16/2011   Depression    "since my son died in 30-Nov-2022" 01-Oct-2017)   Diverticulosis    DIVERTICULOSIS-COLON 08/14/2009   Qualifier: Diagnosis of  By: Trellis Paganini PA-c, Amy S    DYSPHAGIA UNSPECIFIED 04/24/2009   Qualifier: Diagnosis of  By: Laney Potash, Pam     Dyspnea    "when I lay down at night"    Essential hypertension 09/05/2009   Qualifier: Diagnosis of  By: Harvest Dark CMA, Jennifer     Gastric polyp    GERD 10/19/2007   Qualifier: Diagnosis of  By: Rennie Plowman RN, Blanca Friend: Diagnosis of  By: Laney Potash, Pam     GERD (gastroesophageal reflux disease)    GI bleed    a. h/o GIB on DAPT, now on ASA only.   Headache 01/03/2013   Hearing loss    left ear   History of pulmonary embolus (PE) 01/02/2011   Hyperlipemia  Hyperlipidemia, unspecified 04/24/2009   Qualifier: Diagnosis of  By: Trellis Paganini PA-c, Amy S    Hypertension    ICD-St.Jude 08/06/2011   S/p St. Jude ICD placement 08/05/11    Insomnia    Irritable bowel syndrome    Ischemic cardiomyopathy    EF 10-15%   Memory loss    Migraine headache without aura    Myocardial infarct ([place]) 2011 x 2   Dr. 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[place]) 05/05/2012   Pulmonary embolism ([place]) 2011   Stroke [facility])    'When I was young." no residual   Thoracic  aortic aneurysm ([place]) 03/31/2018   TOBACCO ABUSE 04/04/2009   Qualifier: Diagnosis of  By: Arvid Right  Qualifier: Diagnosis of  By: [name], MD, Jaquelyn Bitter    Transfusion history    ?'12 or '13   UNSPECIFIED ANEMIA 07/03/2010   Qualifier: Diagnosis of  By: Shelda Pal     Unspecified mastoiditis     Past Surgical History:  Procedure Laterality Date   ANGIOPLASTY  07/02/09, 04/01/10   BILATERAL SALPINGOOPHORECTOMY     CAD( bare metal stent)  02/2009   x 1   CAROTID-SUBCLAVIAN BYPASS GRAFT Left 03/31/2018   Procedure: LEFT SUBCLAVIAN ARTERY BYPASS GRAFT;  Surgeon: Serafina Mitchell, MD;  Location: [facility] OR;  Service: Vascular;  Laterality: Left;   CERVICAL SPINE SURGERY  08/08   COLONOSCOPY WITH PROPOFOL N/A 04/29/2016   Procedure: COLONOSCOPY WITH PROPOFOL;  Surgeon: [name], MD;  Location: [facility] ENDOSCOPY;  Service: Gastroenterology;  Laterality: N/A;   ESOPHAGOGASTRODUODENOSCOPY N/A 12/09/2016   Procedure: ESOPHAGOGASTRODUODENOSCOPY (EGD);  Surgeon: [name], MD;  Location: [facility];  Service: Gastroenterology;  Laterality: N/A;   ESOPHAGOGASTRODUODENOSCOPY (EGD) WITH PROPOFOL N/A 04/29/2016   Procedure: ESOPHAGOGASTRODUODENOSCOPY (EGD) WITH PROPOFOL;  Surgeon: [name], MD;  Location: [facility];  Service: Gastroenterology;  Laterality: N/A;   EUS N/A 05/15/2016   Procedure: UPPER ENDOSCOPIC ULTRASOUND (EUS) RADIAL;  Surgeon: Milus Banister, MD;  Location: [facility] END[facility];  Service: Endoscopy;  Laterality: N/A;   IMPLANTABLE CARDIOVERTER DEFIBRILLATOR IMPLANT N/A 08/05/2011   Primary prevention SJM ICD implanted,  Analyze ST study patient   INNER EAR SURGERY     left x 17   LUMBAR DISC SURGERY  02/2008   fusion   NASAL SEPTUM SURGERY     THORACIC AORTIC ENDOVASCULAR STENT GRAFT N/A 03/31/2018   Procedure: THORACIC AORTIC ENDOVASCULAR STENT GRAFT;  Surgeon: Serafina Mitchell, MD;  Location: [facility] OR;  Service:  Vascular;  Laterality: N/A;   TOTAL ABDOMINAL HYSTERECTOMY     complete    Current Medications: Current Meds  Medication Sig   amitriptyline (ELAVIL) 50 MG tablet TAKE 1 TABLET BY MOUTH AT BEDTIME.   aspirin 81 MG tablet Take 81 mg by mouth every morning.    bisoprolol (ZEBETA) 5 MG tablet TAKE 1 TABLET BY MOUTH DAILY.   dexlansoprazole (DEXILANT) 60 MG capsule Take 1 capsule (60 mg total) by mouth daily. Please schedule an OV for further refills.Thank you   famotidine (PEPCID) 20 MG tablet Take 1 tablet (20 mg total) by mouth 2 (two) times daily. Please schedule a yearly follow up for further refills. Thank  you.   furosemide (LASIX) 20 MG tablet Take 2 tablets (40 mg total) by mouth daily.   polyethylene glycol powder (GLYCOLAX/MIRALAX) powder Take 17 g by mouth daily as needed for mild constipation.   PREMARIN vaginal cream    PROAIR HFA 108 (90 Base) MCG/ACT inhaler INHALE 2 PUFFS INTO THE LUNGS EVERY 6 HOURS AS NEEDED FOR SHORTNESS OF BREATH   rosuvastatin (CRESTOR) 20 MG tablet TAKE 1 TABLET BY MOUTH AT BEDTIME.   sacubitril-valsartan (ENTRESTO) 49-51 MG Take 1 tablet by mouth 2 (two) times daily.   topiramate (TOPAMAX) 50 MG tablet Take 1 tablet (50 mg total) by mouth daily.     Allergies:   Lisinopril, Sulfa antibiotics, and Sulfonamide derivatives   Social History   Socioeconomic History   Marital status: Divorced    Spouse name: Not on file   Number of children: 2   Years of education: 11   Highest education level: Not on file  Occupational History   Occupation: Unemployed    Fish farm manager: UNEMPLOYED    Employer: DISABLED  Tobacco Use   Smoking status: Current Some Day Smoker    Packs/day: 0.13    Years: 30.00    Pack years: 3.90    Types: Cigarettes   Smokeless tobacco: Never Used   Tobacco comment: Restarted smoking.  few cig. daily  Vaping Use   Vaping Use: Never used  Substance and Sexual Activity   Alcohol use: No    Alcohol/week: 0.0  standard drinks   Drug use: No   Sexual activity: Not on file  Other Topics Concern   Not on file  Social History Narrative   Pt lives in [place] alone.  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   Patient has 11 th grade education.Right handed.   Caffeine- one cup daily         Social Determinants of Health   Financial Resource Strain:    Difficulty of Paying Living Expenses: Not on file  Food Insecurity:    Worried About [place] in the Last Year: Not on file   Ran Out of Food in the Last Year: Not on file  Transportation Needs:    Lack of Transportation (Medical): Not on file   Lack of Transportation (Non-Medical): Not on file  Physical Activity:    Days of Exercise per Week: Not on file   Minutes of Exercise per Session: Not on file  Stress:    Feeling of Stress : Not on file  Social Connections:    Frequency of Communication with Friends and Family: Not on file   Frequency of Social Gatherings with Friends and Family: Not on file   Attends Religious Services: Not on file   Active Member of Clubs or Organizations: Not on file   Attends Archivist Meetings: Not on file   Marital Status: Not on file     Family History: The patient's family history includes Bladder Cancer in her brother; Breast cancer in her cousin; Cancer in her son; Colon cancer in her brother and mother.  ROS:   Please see the history of present illness.    All other systems reviewed and are negative.  EKGs/Labs/Other Studies Reviewed:    The following studies were reviewed today: Echo 10-22-2016: Impressions:   - Limited echo for EF. Severe LVE. EF 25-30% Abnormal septal motion  septal, apical and inferior wall hypokinesis  RV normal size with pacing wire seen in cavity.   EKG:  EKG is  ordered today.  The ekg ordered today demonstrates NSR 81 bpm, age-indeterminate anterior infarct, LAFB  Recent Labs: 03/12/2020: ALT 16; BUN 17; Creatinine,  Ser 1.37; Potassium 4.5; Sodium 142  Recent Lipid Panel    Component Value Date/Time   CHOL 203 (H) 03/12/2020 1442   TRIG 122 03/12/2020 1442   HDL 64 03/12/2020 1442   CHOLHDL 3.2 03/12/2020 1442   CHOLHDL 2.7 01/15/2016 1345   VLDL 45 (H) 01/15/2016 1345   LDLCALC 118 (H) 03/12/2020 1442     Risk Assessment/Calculations:       Physical Exam:    VS:  BP 134/90    Pulse 81    Ht $R'5\' 2"'Zb$  (1.575 m)    Wt 178 lb (80.7 kg)    SpO2 96%    BMI 32.56 kg/m     Wt Readings from Last 3 Encounters:  05/23/20 178 lb (80.7 kg)  04/06/20 174 lb (78.9 kg)  03/12/20 180 lb (81.6 kg)     GEN:  Well nourished, well developed in no acute distress HEENT: Normal NECK: No JVD; No carotid bruits LYMPHATICS: No lymphadenopathy CARDIAC: RRR, no murmurs, rubs, gallops RESPIRATORY:  Clear to auscultation without rales, wheezing or rhonchi  ABDOMEN: Soft, non-tender, non-distended MUSCULOSKELETAL:  No edema; No deformity  SKIN: Warm and dry NEUROLOGIC:  Alert and oriented x 3 PSYCHIATRIC:  Normal affect   ASSESSMENT:    1. Chronic systolic CHF (congestive heart failure) ([place])   2. Coronary artery disease involving native coronary artery of native heart without angina pectoris   3. Thoracic aortic aneurysm without rupture ([facility])   4. Stage 3b chronic kidney disease ([place])   5. Mixed hyperlipidemia    PLAN:    In order of problems listed above:  1. The patient remains clinically stable with New York Heart Association functional class II symptoms on bisoprolol and Entresto.  Her last echo was greater than 3 years ago.  Will update an echocardiogram.  Continue current medical therapy.  Most recent labs reviewed. 2. Doing well on aspirin and a high intensity statin drug (rosuvastatin 20 mg).  No anginal chest pain.  Last Myoview scan showed large infarct with no peri-infarct ischemia. 3. Has been treated with endovascular repair.  Followed by vascular surgery. 4. Most recent labs reviewed with  EGFR of 39.  On appropriate medical therapy. 5. Treated with Crestor 20 mg daily.   Medication Adjustments/Labs and Tests Ordered: Current medicines are reviewed at length with the patient today.  Concerns regarding medicines are outlined above.  Orders Placed This Encounter  Procedures   EKG 12-Lead   ECHOCARDIOGRAM COMPLETE   No orders of the defined types were placed in this encounter.   Patient Instructions  Medication Instructions:  Your provider recommends that you continue on your current medications as directed. Please refer to the Current Medication list given to you today.   *If you need a refill on your cardiac medications before your next appointment, please call your pharmacy*  Testing/Procedures: Your provider has requested that you have an echocardiogram. Echocardiography is a painless test that uses sound waves to create images of your heart. It provides your doctor with information about the size and shape of your heart and how well your hearts chambers and valves are working. This procedure takes approximately one hour. There are no restrictions for this procedure.     Follow-Up: At [facility], you and your health needs are our priority.  As part of our continuing mission to provide  you with exceptional heart care, we have created designated Provider Care Teams.  These Care Teams include your primary Cardiologist (physician) and Advanced Practice Providers (APPs -  Physician Assistants and Nurse Practitioners) who all work together to provide you with the care you need, when you need it. Your next appointment:   6 month(s) The format for your next appointment:   In Person Provider:   Richardson Dopp, PA-C     Signed, [name], MD  05/23/2020 3:29 PM    [place]

## 2020-06-11 ENCOUNTER — Encounter: Payer: Self-pay | Admitting: *Deleted

## 2020-06-11 DIAGNOSIS — Z006 Encounter for examination for normal comparison and control in clinical research program: Secondary | ICD-10-CM

## 2020-06-13 ENCOUNTER — Ambulatory Visit (HOSPITAL_COMMUNITY): Payer: Medicare Other | Attending: Internal Medicine

## 2020-06-13 ENCOUNTER — Other Ambulatory Visit: Payer: Self-pay

## 2020-06-13 DIAGNOSIS — I5022 Chronic systolic (congestive) heart failure: Secondary | ICD-10-CM | POA: Insufficient documentation

## 2020-06-13 LAB — ECHOCARDIOGRAM COMPLETE: S' Lateral: 5 cm

## 2020-06-13 MED ORDER — PERFLUTREN LIPID MICROSPHERE
1.0000 mL | INTRAVENOUS | Status: AC | PRN
  Administered 2020-06-13: 1 mL via INTRAVENOUS

## 2020-06-18 ENCOUNTER — Telehealth: Payer: Self-pay | Admitting: Cardiovascular Disease

## 2020-06-18 NOTE — Telephone Encounter (Signed)
Reviewed results with patient who verbalized understanding. 

## 2020-06-18 NOTE — Telephone Encounter (Signed)
Patient returning call for echo results. 

## 2020-06-18 NOTE — Telephone Encounter (Signed)
-----   Message from [name], MD sent at 06/14/2020  5:00 PM EST ----- Stable findings of severe LV dysfunction.  No evidence of progressive LV dilatation or any significant valvular disease.  RV function is normal.  No significant changes from 2018 echo.  Continue medical therapy.

## 2020-06-29 ENCOUNTER — Ambulatory Visit (INDEPENDENT_AMBULATORY_CARE_PROVIDER_SITE_OTHER): Payer: Medicare Other

## 2020-06-29 DIAGNOSIS — I5022 Chronic systolic (congestive) heart failure: Secondary | ICD-10-CM

## 2020-07-02 LAB — CUP PACEART REMOTE DEVICE CHECK
Battery Remaining Longevity: 29 mo
Battery Remaining Percentage: 25 %
Battery Voltage: 2.81 V
Brady Statistic RV Percent Paced: 1 %
Date Time Interrogation Session: 20211203100201
HighPow Impedance: 75 Ohm
HighPow Impedance: 75 Ohm
Implantable Lead Implant Date: 20130108
Implantable Lead Location: 753860
Implantable Pulse Generator Implant Date: 20130108
Lead Channel Impedance Value: 530 Ohm
Lead Channel Pacing Threshold Amplitude: 0.75 V
Lead Channel Pacing Threshold Pulse Width: 0.5 ms
Lead Channel Sensing Intrinsic Amplitude: 12 mV
Lead Channel Setting Pacing Amplitude: 2.5 V
Lead Channel Setting Pacing Pulse Width: 0.5 ms
Lead Channel Setting Sensing Sensitivity: 0.5 mV
Pulse Gen Serial Number: 1016523

## 2020-07-10 NOTE — Progress Notes (Signed)
Remote ICD transmission.   

## 2020-07-13 NOTE — Research (Signed)
43M Beat Visit   Data entered at later date  Visit completed on 06/11/2020 Visit completed by phone due to COVID-19. Pt doing well, no complaints.    Current Outpatient Medications:  .  amitriptyline (ELAVIL) 50 MG tablet, TAKE 1 TABLET BY MOUTH AT BEDTIME., Disp: 90 tablet, Rfl: 1 .  aspirin 81 MG tablet, Take 81 mg by mouth every morning., Disp: , Rfl:  .  bisoprolol (ZEBETA) 5 MG tablet, TAKE 1 TABLET BY MOUTH DAILY., Disp: 90 tablet, Rfl: 3 .  dexlansoprazole (DEXILANT) 60 MG capsule, Take 1 capsule (60 mg total) by mouth daily. Please schedule an OV for further refills.Thank you, Disp: 30 capsule, Rfl: 1 .  famotidine (PEPCID) 20 MG tablet, Take 1 tablet (20 mg total) by mouth 2 (two) times daily. Please schedule a yearly follow up for further refills. Thank you., Disp: 60 tablet, Rfl: 1 .  furosemide (LASIX) 20 MG tablet, Take 2 tablets (40 mg total) by mouth daily., Disp: 180 tablet, Rfl: 3 .  polyethylene glycol powder (GLYCOLAX/MIRALAX) powder, Take 17 g by mouth daily as needed for mild constipation., Disp: 255 g, Rfl: 11 .  PREMARIN vaginal cream, , Disp: , Rfl: 12 .  PROAIR HFA 108 (90 Base) MCG/ACT inhaler, INHALE 2 PUFFS INTO THE LUNGS EVERY 6 HOURS AS NEEDED FOR SHORTNESS OF BREATH, Disp: 8.5 g, Rfl: 4 .  rosuvastatin (CRESTOR) 20 MG tablet, TAKE 1 TABLET BY MOUTH AT BEDTIME., Disp: 90 tablet, Rfl: 0 .  sacubitril-valsartan (ENTRESTO) 49-51 MG, Take 1 tablet by mouth 2 (two) times daily., Disp: 180 tablet, Rfl: 1 .  topiramate (TOPAMAX) 50 MG tablet, Take 1 tablet (50 mg total) by mouth daily., Disp: 90 tablet, Rfl: 3 .  nitroGLYCERIN (NITROSTAT) 0.4 MG SL tablet, Place 1 tablet (0.4 mg total) under the tongue every 5 (five) minutes as needed for chest pain., Disp: 25 tablet, Rfl: 11

## 2020-08-20 ENCOUNTER — Telehealth: Payer: Self-pay | Admitting: Family Medicine

## 2020-08-20 ENCOUNTER — Other Ambulatory Visit: Payer: Self-pay

## 2020-08-20 MED ORDER — PROAIR HFA 108 (90 BASE) MCG/ACT IN AERS: INHALATION_SPRAY | RESPIRATORY_TRACT | 4 refills | Status: DC

## 2020-08-20 NOTE — Telephone Encounter (Signed)
Pt called and is needing a refill until her TOC appt in March for  [facility] HFA 108 (90 Base) MCG/ACT inhaler [[id number]]   Sent to: [place], [place]   Please advise.

## 2020-08-20 NOTE — Telephone Encounter (Signed)
Sent!

## 2020-09-28 ENCOUNTER — Ambulatory Visit (INDEPENDENT_AMBULATORY_CARE_PROVIDER_SITE_OTHER): Payer: Medicare Other

## 2020-09-28 DIAGNOSIS — I255 Ischemic cardiomyopathy: Secondary | ICD-10-CM | POA: Diagnosis not present

## 2020-10-01 ENCOUNTER — Other Ambulatory Visit: Payer: Self-pay | Admitting: Physician Assistant

## 2020-10-01 ENCOUNTER — Other Ambulatory Visit: Payer: Self-pay | Admitting: Cardiovascular Disease

## 2020-10-01 ENCOUNTER — Other Ambulatory Visit: Payer: Self-pay | Admitting: Family Medicine

## 2020-10-01 DIAGNOSIS — G43009 Migraine without aura, not intractable, without status migrainosus: Secondary | ICD-10-CM

## 2020-10-01 LAB — CUP PACEART REMOTE DEVICE CHECK
Battery Remaining Longevity: 28 mo
Battery Remaining Percentage: 23 %
Battery Voltage: 2.8 V
Brady Statistic RV Percent Paced: 1 %
Date Time Interrogation Session: 20220304025615
HighPow Impedance: 89 Ohm
HighPow Impedance: 89 Ohm
Implantable Lead Implant Date: 20130108
Implantable Lead Location: 753860
Implantable Pulse Generator Implant Date: 20130108
Lead Channel Impedance Value: 490 Ohm
Lead Channel Pacing Threshold Amplitude: 0.75 V
Lead Channel Pacing Threshold Pulse Width: 0.5 ms
Lead Channel Sensing Intrinsic Amplitude: 12 mV
Lead Channel Setting Pacing Amplitude: 2.5 V
Lead Channel Setting Pacing Pulse Width: 0.5 ms
Lead Channel Setting Sensing Sensitivity: 0.5 mV
Pulse Gen Serial Number: 1016523

## 2020-10-01 NOTE — Telephone Encounter (Signed)
   Notes to clinic: Patient has appointment on 10/04/2020  Review for refill  Last seen by Dr. Pamella Pert   Requested Prescriptions  Pending Prescriptions Disp Refills   amitriptyline (ELAVIL) 50 MG tablet [Pharmacy Med Name: AMITRIPTYLINE HCL 50 MG TAB 50 Tablet] 90 tablet 1    Sig: TAKE 1 TABLET BY MOUTH AT BEDTIME.      Psychiatry:  Antidepressants - Heterocyclics (TCAs) Passed - 10/01/2020 11:08 AM      Passed - Valid encounter within last 6 months    Recent Outpatient Visits           5 months ago Abdominal pain, epigastric   Primary Care at [facility], Lilia Argue, MD   5 months ago Diarrhea, unspecified type   Primary Care at [facility], Lilia Argue, MD   6 months ago Essential hypertension   Primary Care at [facility], Lilia Argue, MD   6 months ago Essential hypertension   Primary Care at [facility], Lilia Argue, MD   1 year ago Pre-diabetes   Primary Care at [facility], Lilia Argue, MD       Future Appointments             In 3 days Sagardia, Ines Bloomer, MD Primary Care at [place]

## 2020-10-02 NOTE — Telephone Encounter (Signed)
Patient have an upcoming appt with you and wondering if she can get a refill before appt

## 2020-10-02 NOTE — Telephone Encounter (Signed)
Patient is requesting a refill of the following medications: Requested Prescriptions   Pending Prescriptions Disp Refills   amitriptyline (ELAVIL) 50 MG tablet [Pharmacy Med Name: AMITRIPTYLINE HCL 50 MG TAB 50 Tablet] 90 tablet 1    Sig: TAKE 1 TABLET BY MOUTH AT BEDTIME.    Date of patient request: 10/01/20 Last office visit: 04/19/20 Date of last refill: 04/20/20 Last refill amount: 90 x1 Follow up time period per chart: 10/04/20 with Dr Kittie Plater

## 2020-10-04 ENCOUNTER — Encounter: Payer: Self-pay | Admitting: Emergency Medicine

## 2020-10-04 ENCOUNTER — Ambulatory Visit (INDEPENDENT_AMBULATORY_CARE_PROVIDER_SITE_OTHER): Payer: Medicare Other | Admitting: Emergency Medicine

## 2020-10-04 ENCOUNTER — Other Ambulatory Visit: Payer: Self-pay

## 2020-10-04 VITALS — BP 131/84 | HR 90 | Temp 98.1°F | Resp 16 | Ht 62.0 in | Wt 176.0 lb

## 2020-10-04 DIAGNOSIS — M5136 Other intervertebral disc degeneration, lumbar region: Secondary | ICD-10-CM

## 2020-10-04 DIAGNOSIS — E785 Hyperlipidemia, unspecified: Secondary | ICD-10-CM | POA: Diagnosis not present

## 2020-10-04 DIAGNOSIS — I255 Ischemic cardiomyopathy: Secondary | ICD-10-CM | POA: Diagnosis not present

## 2020-10-04 DIAGNOSIS — I712 Thoracic aortic aneurysm, without rupture: Secondary | ICD-10-CM

## 2020-10-04 DIAGNOSIS — Z7689 Persons encountering health services in other specified circumstances: Secondary | ICD-10-CM

## 2020-10-04 DIAGNOSIS — Z1231 Encounter for screening mammogram for malignant neoplasm of breast: Secondary | ICD-10-CM

## 2020-10-04 DIAGNOSIS — R7303 Prediabetes: Secondary | ICD-10-CM | POA: Diagnosis not present

## 2020-10-04 DIAGNOSIS — N1832 Chronic kidney disease, stage 3b: Secondary | ICD-10-CM | POA: Diagnosis not present

## 2020-10-04 DIAGNOSIS — M51369 Other intervertebral disc degeneration, lumbar region without mention of lumbar back pain or lower extremity pain: Secondary | ICD-10-CM

## 2020-10-04 DIAGNOSIS — I7122 Aneurysm of the aortic arch, without rupture: Secondary | ICD-10-CM

## 2020-10-04 NOTE — Patient Instructions (Addendum)
   If you have lab work done today you will be contacted with your lab results within the next 2 weeks.  If you have not heard from us then please contact us. The fastest way to get your results is to register for My Chart.   IF you received an x-ray today, you will receive an invoice from [facility]. Please contact [facility] at [phone] with questions or concerns regarding your invoice.   IF you received labwork today, you will receive an invoice from [facility]. Please contact [facility] at [phone] with questions or concerns regarding your invoice.   Our billing staff will not be able to assist you with questions regarding bills from these companies.  You will be contacted with the lab results as soon as they are available. The fastest way to get your results is to activate your My Chart account. Instructions are located on the last page of this paperwork. If you have not heard from us regarding the results in 2 weeks, please contact this office.       Health Maintenance After Age 65 After age 73, you are at a higher risk for certain long-term diseases and infections as well as injuries from falls. Falls are a major cause of broken bones and head injuries in people who are older than age 73. Getting regular preventive care can help to keep you healthy and well. Preventive care includes getting regular testing and making lifestyle changes as recommended by your health care provider. Talk with your health care provider about:  Which screenings and tests you should have. A screening is a test that checks for a disease when you have no symptoms.  A diet and exercise plan that is right for you. What should I know about screenings and tests to prevent falls? Screening and testing are the best ways to find a health problem early. Early diagnosis and treatment give you the best chance of managing medical conditions that are common after age 73. Certain conditions and  lifestyle choices may make you more likely to have a fall. Your health care provider may recommend:  Regular vision checks. Poor vision and conditions such as cataracts can make you more likely to have a fall. If you wear glasses, make sure to get your prescription updated if your vision changes.  Medicine review. Work with your health care provider to regularly review all of the medicines you are taking, including over-the-counter medicines. Ask your health care provider about any side effects that may make you more likely to have a fall. Tell your health care provider if any medicines that you take make you feel dizzy or sleepy.  Osteoporosis screening. Osteoporosis is a condition that causes the bones to get weaker. This can make the bones weak and cause them to break more easily.  Blood pressure screening. Blood pressure changes and medicines to control blood pressure can make you feel dizzy.  Strength and balance checks. Your health care provider may recommend certain tests to check your strength and balance while standing, walking, or changing positions.  Foot health exam. Foot pain and numbness, as well as not wearing proper footwear, can make you more likely to have a fall.  Depression screening. You may be more likely to have a fall if you have a fear of falling, feel emotionally low, or feel unable to do activities that you used to do.  Alcohol use screening. Using too much alcohol can affect your balance and may make you more   likely to have a fall. What actions can I take to lower my risk of falls? General instructions  Talk with your health care provider about your risks for falling. Tell your health care provider if: ? You fall. Be sure to tell your health care provider about all falls, even ones that seem minor. ? You feel dizzy, sleepy, or off-balance.  Take over-the-counter and prescription medicines only as told by your health care provider. These include any  supplements.  Eat a healthy diet and maintain a healthy weight. A healthy diet includes low-fat dairy products, low-fat (lean) meats, and fiber from whole grains, beans, and lots of fruits and vegetables. Home safety  Remove any tripping hazards, such as rugs, cords, and clutter.  Install safety equipment such as grab bars in bathrooms and safety rails on stairs.  Keep rooms and walkways well-lit. Activity  Follow a regular exercise program to stay fit. This will help you maintain your balance. Ask your health care provider what types of exercise are appropriate for you.  If you need a cane or walker, use it as recommended by your health care provider.  Wear supportive shoes that have nonskid soles.   Lifestyle  Do not drink alcohol if your health care provider tells you not to drink.  If you drink alcohol, limit how much you have: ? 0-1 drink a day for women. ? 0-2 drinks a day for men.  Be aware of how much alcohol is in your drink. In the [place] one drink equals one typical bottle of beer (12 oz), one-half glass of wine (5 oz), or one shot of hard liquor (1 oz).  Do not use any products that contain nicotine or tobacco, such as cigarettes and e-cigarettes. If you need help quitting, ask your health care provider. Summary  Having a healthy lifestyle and getting preventive care can help to protect your health and wellness after age 65.  Screening and testing are the best way to find a health problem early and help you avoid having a fall. Early diagnosis and treatment give you the best chance for managing medical conditions that are more common for people who are older than age 73.  Falls are a major cause of broken bones and head injuries in people who are older than age 73. Take precautions to prevent a fall at home.  Work with your health care provider to learn what changes you can make to improve your health and wellness and to prevent falls. This information is not intended  to replace advice given to you by your health care provider. Make sure you discuss any questions you have with your health care provider. Document Revised: 11/04/2018 Document Reviewed: 05/27/2017 Elsevier Patient Education  2021 [employer]vier Inc.  

## 2020-10-04 NOTE — Progress Notes (Signed)
[name] 75 y.o.   Chief Complaint  Patient presents with  . Transitions Of Care  . Back Pain    Per patient lower area radiates to the side of back for 2 weeks    HISTORY OF PRESENT ILLNESS: This is a 75 y.o. female former patient of Dr. Pamella Pert here to establish care with me. Multiple chronic medical problems including the following: #1 ischemic cardiomyopathy status post MI x2 in the past #2 history of COPD #3 repair of thoracic aortic aneurysm #4 chronic kidney disease #5 dyslipidemia #6 prediabetes #7 degenerative disc disease of her spine with chronic back pain #8 GERD #9 former smoker, quit 6 months ago.  HPI   Prior to Admission medications   Medication Sig Start Date End Date Taking? Authorizing Provider  amitriptyline (ELAVIL) 50 MG tablet TAKE 1 TABLET BY MOUTH AT BEDTIME. 10/02/20  Yes Tawanda Schall, Ines Bloomer, MD  aspirin 81 MG tablet Take 81 mg by mouth every morning.   Yes [provider]  bisoprolol (ZEBETA) 5 MG tablet TAKE 1 TABLET BY MOUTH DAILY. 05/21/20  Yes [name], MD  dexlansoprazole (DEXILANT) 60 MG capsule Take 1 capsule (60 mg total) by mouth daily. Please schedule an OV for further refills.Thank you 12/27/19  Yes Armbruster, Carlota Raspberry, MD  ENTRESTO 49-51 MG TAKE 1 TABLET BY MOUTH 2 TIMES DAILY. 10/01/20  Yes [name], MD  famotidine (PEPCID) 20 MG tablet Take 1 tablet (20 mg total) by mouth 2 (two) times daily. Please schedule a yearly follow up for further refills. Thank you. 01/23/20  Yes Armbruster, Carlota Raspberry, MD  furosemide (LASIX) 20 MG tablet Take 2 tablets (40 mg total) by mouth daily. 05/12/19  Yes Weaver, Scott T, PA-C  polyethylene glycol powder (GLYCOLAX/MIRALAX) powder Take 17 g by mouth daily as needed for mild constipation. 07/08/17  Yes Jacelyn Pi, Lilia Argue, MD  [facility] HFA 108 [phone] Base) MCG/ACT inhaler 1 puff daily as needed. 08/20/20  Yes Just, Laurita Quint, FNP  rosuvastatin (CRESTOR) 20 MG tablet TAKE 1 TABLET BY  MOUTH AT BEDTIME. 10/01/20  Yes [name], MD  topiramate (TOPAMAX) 50 MG tablet Take 1 tablet (50 mg total) by mouth daily. 11/14/19  Yes Marcial Pacas, MD  nitroGLYCERIN (NITROSTAT) 0.4 MG SL tablet Place 1 tablet (0.4 mg total) under the tongue every 5 (five) minutes as needed for chest pain. 03/29/19 03/28/20  Richardson Dopp T, PA-C  PREMARIN vaginal cream  05/25/18   [provider]    Allergies  Allergen Reactions  . Lisinopril     cough  . Sulfa Antibiotics Other (See Comments)    Unknown, childhood allergy   . Sulfonamide Derivatives Other (See Comments)    UNSURE    Patient Active Problem List   Diagnosis Date Noted  . Chronic insomnia 11/14/2019  . Grief at loss of child 08/05/2018  . Colon polyp 06/16/2018  . Thoracic aortic aneurysm ([place]) 03/31/2018  . Chronic renal insufficiency   . AICD (automatic cardioverter/defibrillator) present   . Chronic systolic CHF (congestive heart failure) ([place])   . COPD (chronic obstructive pulmonary disease) ([place])   . Hyperlipemia   . Insomnia   . Pre-diabetes 06/19/2017  . Gastric polyp   . Osteoporosis 05/11/2013  . Hearing loss 04/29/2013  . Mild cognitive impairment 04/29/2013  . Headache 01/03/2013  . 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 ICD-St.Jude 08/06/2011  . Cardiomyopathy, ischemic 06/12/2011  . DDD (degenerative disc disease), lumbar 05/16/2011  . History  of pulmonary embolus (PE) 01/02/2011  . UNSPECIFIED ANEMIA 07/03/2010  . Essential hypertension 09/05/2009  . DIVERTICULOSIS-COLON 08/14/2009  . Hyperlipidemia, unspecified 04/24/2009  . DYSPHAGIA UNSPECIFIED 04/24/2009  . PERSONAL HX COLONIC POLYPS 04/24/2009  . TOBACCO ABUSE 04/04/2009  . CAD (coronary artery disease) 02/25/2009  . GERD 10/19/2007  . IBS 10/19/2007  . COLONIC POLYPS, ADENOMATOUS 05/12/2007    Past Medical History:  Diagnosis Date  . Abdominal pain, left lower quadrant 08/14/2009   Qualifier: Diagnosis of  By: Laney Potash, Pam    . Abnormal CT scan, stomach   . Abnormal LFTs 06/19/2017  . AICD (automatic cardioverter/defibrillator) present    Dr.Allred follows  . Aortic arch pseudoaneurysm (HCC)    a. followed by Dr. Prescott Gum.  . Arthritis   . Asthma   . Benign neoplasm of colon   . CAD (coronary artery disease) 02/2009   a. anterior STEMI rx with BMS to prox LAD in 02/2009. b. ISR s/p PTCA 06/2009. c. ISR s/p thrombectomy & PTCA 03/2010 due to late stent thrombosis. // Myoview 04/2019: EF 28, ant, ant-sept, inf-sept scar, no ischemia, high risk (stable>>cont med Rx)   . Cardiomyopathy, ischemic 06/12/2011  . Chronic renal insufficiency    stage 3  . Chronic systolic CHF (congestive heart failure) ([place])    a. s/p ST. Jude ICD Nov 01, 2011.  Marland Kitchen Chronic systolic heart failure ([place]) 08/05/2011  . Colon polyp   . Complication of anesthesia   . COPD (chronic obstructive pulmonary disease) ([place])   . CORONARY ARTERY DISEASE 04/24/2009   Qualifier: Diagnosis of  By: Trellis Paganini PA-c, Amy S   . DDD (degenerative disc disease), lumbar 05/16/2011  . Depression    "since my son died in 12/02/2022" 10/31/17)  . Diverticulosis   . DIVERTICULOSIS-COLON 08/14/2009   Qualifier: Diagnosis of  By: Trellis Paganini PA-c, Amy S   . DYSPHAGIA UNSPECIFIED 04/24/2009   Qualifier: Diagnosis of  By: Laney Potash, Pam    . Dyspnea    "when I lay down at night"   . Essential hypertension 09/05/2009   Qualifier: Diagnosis of  By: Harvest Dark CMA, Anderson Malta    . Gastric polyp   . GERD 10/19/2007   Qualifier: Diagnosis of  By: Rennie Plowman RN, Blanca Friend: Diagnosis of  By: Laney Potash, Pam    . GERD (gastroesophageal reflux disease)   . GI bleed    a. h/o GIB on DAPT, now on ASA only.  Marland Kitchen Headache 01/03/2013  . Hearing loss    left ear  . History of pulmonary embolus (PE) 01/02/2011  . Hyperlipemia   . Hyperlipidemia, unspecified 04/24/2009   Qualifier: Diagnosis of  By: Trellis Paganini PA-c, Amy S   . Hypertension   . ICD-St.Jude 08/06/2011   S/p St. Jude  ICD placement 08/05/11   . Insomnia   . Irritable bowel syndrome   . Ischemic cardiomyopathy    EF 10-15%  . Memory loss   . Migraine headache without aura   . Myocardial infarct [facility]) 31-Oct-2009 x 2   Dr. [place] Nickel   . PERSONAL HX COLONIC POLYPS 04/24/2009   Qualifier: Diagnosis of  By: Shane Crutch, Amy S November, 2011 colonoscopy demonstrated a sessile cecal polyp and sigmoid polyp   . Pneumonia   . PONV (postoperative nausea and vomiting)   . Pre-diabetes   . 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 Pulmonary embolism ([place]) 31-Oct-2009  . Stroke [facility])    'When I was young." no residual  . Thoracic  aortic aneurysm ([place]) 03/31/2018  . TOBACCO ABUSE 04/04/2009   Qualifier: Diagnosis of  By: Arvid Right  Qualifier: Diagnosis of  By: [name], MD, Jaquelyn Bitter   . Transfusion history    ?'12 or '13  . UNSPECIFIED ANEMIA 07/03/2010   Qualifier: Diagnosis of  By: Shelda Pal    . Unspecified mastoiditis     Past Surgical History:  Procedure Laterality Date  . ANGIOPLASTY  07/02/09, 04/01/10  . BILATERAL SALPINGOOPHORECTOMY    . CAD( bare metal stent)  02/2009   x 1  . CAROTID-SUBCLAVIAN BYPASS GRAFT Left 03/31/2018   Procedure: LEFT SUBCLAVIAN ARTERY BYPASS GRAFT;  Surgeon: Serafina Mitchell, MD;  Location: [facility];  Service: Vascular;  Laterality: Left;  . CERVICAL SPINE SURGERY  08/08  . COLONOSCOPY WITH PROPOFOL N/A 04/29/2016   Procedure: COLONOSCOPY WITH PROPOFOL;  Surgeon: [name], MD;  Location: [facility];  Service: Gastroenterology;  Laterality: N/A;  . ESOPHAGOGASTRODUODENOSCOPY N/A 12/09/2016   Procedure: ESOPHAGOGASTRODUODENOSCOPY (EGD);  Surgeon: [name], MD;  Location: [facility];  Service: Gastroenterology;  Laterality: N/A;  . ESOPHAGOGASTRODUODENOSCOPY (EGD) WITH PROPOFOL N/A 04/29/2016   Procedure: ESOPHAGOGASTRODUODENOSCOPY (EGD) WITH PROPOFOL;  Surgeon: [name], MD;  Location: [facility];   Service: Gastroenterology;  Laterality: N/A;  . EUS N/A 05/15/2016   Procedure: UPPER ENDOSCOPIC ULTRASOUND (EUS) RADIAL;  Surgeon: Milus Banister, MD;  Location: [facility];  Service: Endoscopy;  Laterality: N/A;  . IMPLANTABLE CARDIOVERTER DEFIBRILLATOR IMPLANT N/A 08/05/2011   Primary prevention SJM ICD implanted,  Analyze ST study patient  . INNER EAR SURGERY     left x 17  . LUMBAR DISC SURGERY  02/2008   fusion  . NASAL SEPTUM SURGERY    . THORACIC AORTIC ENDOVASCULAR STENT GRAFT N/A 03/31/2018   Procedure: THORACIC AORTIC ENDOVASCULAR STENT GRAFT;  Surgeon: Serafina Mitchell, MD;  Location[facility] OR;  Service: Vascular;  Laterality: N/A;  . TOTAL ABDOMINAL HYSTERECTOMY     complete    Social History   Socioeconomic History  . Marital status: Divorced    Spouse name: Not on file  . Number of children: 2  . Years of education: 35  . Highest education level: Not on file  Occupational History  . Occupation: Merchandiser, retail: UNEMPLOYED    Employer: DISABLED  Tobacco Use  . Smoking status: Current Some Day Smoker    Packs/day: 0.13    Years: 30.00    Pack years: 3.90    Types: Cigarettes  . Smokeless tobacco: Never Used  . Tobacco comment: Restarted smoking.  few cig. daily  Vaping Use  . Vaping Use: Never used  Substance and Sexual Activity  . Alcohol use: No    Alcohol/week: 0.0 standard drinks  . Drug use: No  . Sexual activity: Not on file  Other Topics Concern  . Not on file  Social History Narrative   Pt lives in [place] alone.  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   Patient has 11 th grade education.Right handed.   Caffeine- one cup daily         Social Determinants of Health   Financial Resource Strain: Not on file  Food Insecurity: Not on file  Transportation Needs: Not on file  Physical Activity: Not on file  Stress: Not on file  Social Connections: Not on file  Intimate Partner Violence: Not on file    Family History  Problem  Relation Age of Onset  .  Colon cancer Mother   . Colon cancer Brother   . Bladder Cancer Brother   . Breast cancer Cousin   . Cancer Son      Review of Systems  Constitutional: Negative.  Negative for chills and fever.  HENT: Negative.  Negative for congestion and sore throat.   Respiratory: Negative.  Negative for cough and shortness of breath.   Cardiovascular: Negative.  Negative for chest pain and palpitations.  Gastrointestinal: Negative.  Negative for abdominal pain, blood in stool, diarrhea, melena, nausea and vomiting.       Occasional episodes of fecal incontinence for many years  Genitourinary: Negative.  Negative for dysuria and hematuria.  Musculoskeletal: Positive for back pain (Chronic back pain).  Skin: Negative.  Negative for rash.  Neurological: Negative.  Negative for dizziness and headaches.  Endo/Heme/Allergies: Negative.   All other systems reviewed and are negative.    Today's Vitals   10/04/20 1512  BP: 131/84  Pulse: 90  Resp: 16  Temp: 98.1 F (36.7 C)  TempSrc: Temporal  SpO2: 97%  Weight: 176 lb (79.8 kg)  Height: 5\' 2"  (1.575 m)   Body mass index is 32.19 kg/m.   Physical Exam Vitals reviewed.  Constitutional:      Appearance: Normal appearance.  HENT:     Head: Normocephalic.  Eyes:     Extraocular Movements: Extraocular movements intact.     Pupils: Pupils are equal, round, and reactive to light.  Cardiovascular:     Rate and Rhythm: Normal rate and regular rhythm.     Pulses: Normal pulses.     Heart sounds: Normal heart sounds.  Pulmonary:     Effort: Pulmonary effort is normal.     Breath sounds: Normal breath sounds.  Musculoskeletal:        General: Normal range of motion.     Cervical back: Normal range of motion and neck supple.     Right lower leg: No edema.     Left lower leg: No edema.  Skin:    General: Skin is warm and dry.     Capillary Refill: Capillary refill takes less than 2 seconds.  Neurological:      General: No focal deficit present.     Mental Status: She is alert and oriented to person, place, and time.  Psychiatric:        Mood and Affect: Mood normal.        Behavior: Behavior normal.      ASSESSMENT & PLAN: [name] was seen today for transitions of care and back pain.  Diagnoses and all orders for this visit:  Encounter to establish care  Cardiomyopathy, ischemic  Pre-diabetes  Dyslipidemia  DDD (degenerative disc disease), lumbar  Pseudoaneurysm of aortic arch ([place])  Stage 3b chronic kidney disease ([place])  Encounter for screening mammogram for malignant neoplasm of breast -     MM Digital Screening; Future    Patient Instructions       If you have lab work done today you will be contacted with your lab results within the next 2 weeks.  If you have not heard from [place] then please contact us. The fastest way to get your results is to register for My Chart.   IF you received an x-ray today, you will receive an invoice from [facility]. Please contact [facility] at [phone] with questions or concerns regarding your invoice.   IF you received labwork today, you will receive an invoice from [facility]. Please contact [facility]  at [phone] with questions or concerns regarding your invoice.   Our billing staff will not be able to assist you with questions regarding bills from these companies.  You will be contacted with the lab results as soon as they are available. The fastest way to get your results is to activate your My Chart account. Instructions are located on the last page of this paperwork. If you have not heard from [place] regarding the results in 2 weeks, please contact this office.     Health Maintenance After Age 6 After age 23, you are at a higher risk for certain long-term diseases and infections as well as injuries from falls. Falls are a major cause of broken bones and head injuries in people who are older than age 61. Getting  regular preventive care can help to keep you healthy and well. Preventive care includes getting regular testing and making lifestyle changes as recommended by your health care provider. Talk with your health care provider about:  Which screenings and tests you should have. A screening is a test that checks for a disease when you have no symptoms.  A diet and exercise plan that is right for you. What should I know about screenings and tests to prevent falls? Screening and testing are the best ways to find a health problem early. Early diagnosis and treatment give you the best chance of managing medical conditions that are common after age 5. Certain conditions and lifestyle choices may make you more likely to have a fall. Your health care provider may recommend:  Regular vision checks. Poor vision and conditions such as cataracts can make you more likely to have a fall. If you wear glasses, make sure to get your prescription updated if your vision changes.  Medicine review. Work with your health care provider to regularly review all of the medicines you are taking, including over-the-counter medicines. Ask your health care provider about any side effects that may make you more likely to have a fall. Tell your health care provider if any medicines that you take make you feel dizzy or sleepy.  Osteoporosis screening. Osteoporosis is a condition that causes the bones to get weaker. This can make the bones weak and cause them to break more easily.  Blood pressure screening. Blood pressure changes and medicines to control blood pressure can make you feel dizzy.  Strength and balance checks. Your health care provider may recommend certain tests to check your strength and balance while standing, walking, or changing positions.  Foot health exam. Foot pain and numbness, as well as not wearing proper footwear, can make you more likely to have a fall.  Depression screening. You may be more likely to have a  fall if you have a fear of falling, feel emotionally low, or feel unable to do activities that you used to do.  Alcohol use screening. Using too much alcohol can affect your balance and may make you more likely to have a fall. What actions can I take to lower my risk of falls? General instructions  Talk with your health care provider about your risks for falling. Tell your health care provider if: ? You fall. Be sure to tell your health care provider about all falls, even ones that seem minor. ? You feel dizzy, sleepy, or off-balance.  Take over-the-counter and prescription medicines only as told by your health care provider. These include any supplements.  Eat a healthy diet and maintain a healthy weight. A healthy diet includes low-fat  dairy products, low-fat (lean) meats, and fiber from whole grains, beans, and lots of fruits and vegetables. Home safety  Remove any tripping hazards, such as rugs, cords, and clutter.  Install safety equipment such as grab bars in bathrooms and safety rails on stairs.  Keep rooms and walkways well-lit. Activity  Follow a regular exercise program to stay fit. This will help you maintain your balance. Ask your health care provider what types of exercise are appropriate for you.  If you need a cane or walker, use it as recommended by your health care provider.  Wear supportive shoes that have nonskid soles.   Lifestyle  Do not drink alcohol if your health care provider tells you not to drink.  If you drink alcohol, limit how much you have: ? 0-1 drink a day for women. ? 0-2 drinks a day for men.  Be aware of how much alcohol is in your drink. In the [place] one drink equals one typical bottle of beer (12 oz), one-half glass of wine (5 oz), or one shot of hard liquor (1 oz).  Do not use any products that contain nicotine or tobacco, such as cigarettes and e-cigarettes. If you need help quitting, ask your health care provider. Summary  Having a  healthy lifestyle and getting preventive care can help to protect your health and wellness after age 34.  Screening and testing are the best way to find a health problem early and help you avoid having a fall. Early diagnosis and treatment give you the best chance for managing medical conditions that are more common for people who are older than age 109.  Falls are a major cause of broken bones and head injuries in people who are older than age 51. Take precautions to prevent a fall at home.  Work with your health care provider to learn what changes you can make to improve your health and wellness and to prevent falls. This information is not intended to replace advice given to you by your health care provider. Make sure you discuss any questions you have with your health care provider. Document Revised: 11/04/2018 Document Reviewed: 05/27/2017 Elsevier Patient Education  2021 Elsevier Inc.      Agustina Caroli, MD [facility]

## 2020-10-09 NOTE — Progress Notes (Signed)
Remote ICD transmission.   

## 2020-10-11 DIAGNOSIS — M5440 Lumbago with sciatica, unspecified side: Secondary | ICD-10-CM | POA: Diagnosis not present

## 2020-10-18 ENCOUNTER — Other Ambulatory Visit (HOSPITAL_COMMUNITY): Payer: Self-pay | Admitting: Neurosurgery

## 2020-10-18 ENCOUNTER — Other Ambulatory Visit: Payer: Self-pay | Admitting: Neurosurgery

## 2020-10-18 DIAGNOSIS — M5440 Lumbago with sciatica, unspecified side: Secondary | ICD-10-CM

## 2020-10-19 ENCOUNTER — Telehealth (HOSPITAL_COMMUNITY): Payer: Self-pay

## 2020-10-23 ENCOUNTER — Other Ambulatory Visit: Payer: Self-pay | Admitting: Student

## 2020-10-23 DIAGNOSIS — M5440 Lumbago with sciatica, unspecified side: Secondary | ICD-10-CM

## 2020-10-24 NOTE — Telephone Encounter (Signed)
Phone call to patient to verify medication list and allergies for myelogram procedure. Pt instructed to hold Amitriptylline(Elavil)  for 48hrs prior to myelogram appointment time and 24 hours after appointment. Pt also instructed to have a driver the day of the procedure, the procedure would take around 2 hours, and discharge instructions discussed. Pt verbalized understanding.

## 2020-10-26 ENCOUNTER — Ambulatory Visit
Admission: RE | Admit: 2020-10-26 | Discharge: 2020-10-26 | Disposition: A | Discharge status: Home / Self Care | Payer: Medicare Other | Source: Ambulatory Visit | Attending: Student | Admitting: Student

## 2020-10-26 ENCOUNTER — Other Ambulatory Visit: Payer: Self-pay

## 2020-10-26 DIAGNOSIS — M5440 Lumbago with sciatica, unspecified side: Secondary | ICD-10-CM

## 2020-10-26 DIAGNOSIS — M48061 Spinal stenosis, lumbar region without neurogenic claudication: Secondary | ICD-10-CM | POA: Diagnosis not present

## 2020-10-26 DIAGNOSIS — M4326 Fusion of spine, lumbar region: Secondary | ICD-10-CM | POA: Diagnosis not present

## 2020-10-26 MED ORDER — DIAZEPAM 5 MG PO TABS
5.0000 mg | ORAL_TABLET | Freq: Once | ORAL | Status: AC
  Administered 2020-10-26: 5 mg via ORAL

## 2020-10-26 MED ORDER — IOPAMIDOL (ISOVUE-M 200) INJECTION 41%
18.0000 mL | Freq: Once | INTRAMUSCULAR | Status: AC
  Administered 2020-10-26: 18 mL via INTRATHECAL

## 2020-10-26 MED ORDER — IOPAMIDOL (ISOVUE-M 200) INJECTION 41%: 18.0000 mL | Freq: Once | INTRAMUSCULAR | Status: DC

## 2020-10-26 NOTE — Discharge Instructions (Signed)
Myelogram Discharge Instructions  1. Go home and rest quietly for the next 24 hours.  It is important to lie flat for the next 24 hours.  Get up only to go to the restroom.  You may lie in the bed or on a couch on your back, your stomach, your left side or your right side.  You may have one pillow under your head.  You may have pillows between your knees while you are on your side or under your knees while you are on your back.  2. DO NOT drive today.  Recline the seat as far back as it will go, while still wearing your seat belt, on the way home.  3. You may get up to go to the bathroom as needed.  You may sit up for 10 minutes to eat.  You may resume your normal diet and medications unless otherwise indicated.  Drink lots of extra fluids today and tomorrow.  4. The incidence of headache, nausea, or vomiting is about 5% (one in 20 patients).  If you develop a headache, lie flat and drink plenty of fluids until the headache goes away.  Caffeinated beverages may be helpful.  If you develop severe nausea and vomiting or a headache that does not go away with flat bed rest, call [phone].  5. You may resume normal activities after your 24 hours of bed rest is over; however, do not exert yourself strongly or do any heavy lifting tomorrow. If when you get up you have a headache when standing, go back to bed and force fluids for another 24 hours.  6. Call your physician for a follow-up appointment.  The results of your myelogram will be sent directly to your physician by the following day.  7. If you have any questions or if complications develop after you arrive home, please call [phone].  Discharge instructions have been explained to the patient.  The patient, or the person responsible for the patient, fully understands these instructions   YOU MAY TAKE YOUR ELAVIL (AMITRIPTYLINE)TOMORROW ON 10/27/20@1PM  OR THEREAFTER.

## 2020-10-26 NOTE — Progress Notes (Signed)
Pt reports she has been off of her elavil for at least 48 hours.

## 2020-11-01 DIAGNOSIS — M545 Low back pain, unspecified: Secondary | ICD-10-CM | POA: Diagnosis not present

## 2020-11-01 DIAGNOSIS — G8929 Other chronic pain: Secondary | ICD-10-CM | POA: Diagnosis not present

## 2020-11-01 DIAGNOSIS — M5440 Lumbago with sciatica, unspecified side: Secondary | ICD-10-CM | POA: Diagnosis not present

## 2020-11-12 NOTE — Progress Notes (Signed)
[facility]  PATIENT: [name] DOB: Jun 08, 1946  HISTORY OF PRESENT ILLNESS: [name] is a 75 year old right-handed white divorced female with a history of headaches and essential tremor. Patient of Dr. Erling Cruz,  She has history of multiple operations on the left mastoid between the ages of 49 and 58. She is currently on disability for that reason. She has CAD, PE, she has congestive heart failure, COPD, irritable bowel syndrome, left ankle fracture, hyperlipidemia, acute renal failure with stage III chronic renal disease, metabolic acidosis from Topamax, pulmonary embolism 03/2010, and a defibrillator was placed 08/2011.  She has history of migraine since age 58s, she complains of vertex area pressure headache, she has 2-6 headaches each month, She is taking amitriptyline 50mg  qhs, which has been helpful. She took overcounter medication for nause, as needed, her headache can last all day long, ASA make her stomach hurt,  She brought her headache diarrhea, she had 4-6 headache each month,  UPDATE April 2021: She was seen by nurse practitioner Hoyle Sauer over the past few years, last visit was in January 2020, overall her migraine is under good control with current Topamax 50 mg every night, she did have a history of metabolic acidosis presumably from Topamax, but she did not want to make any changes at this point,  She is also taking Elavil 50 mg every night, she reported this is from her IBS, which has helped her upset stomach in the past, but she complains of chronic constipation, again she does not want to make any medication changes  Her migraine is overall under good control, only has occasionally flareup, but in March 2021, she experienced daily headache for 2 weeks, she has take frequent Tylenol, up to 10 to 12 tablets each day, her headache is gone now, she reported rarely taking Tylenol if she does not have a headache  She also complains of difficulty sleeping,  frequent awakening at nighttime,  Update November 13, 2020 SS: Here today alone somewhat limited historian, remains on Topamax 50 mg daily, amitriptyline 50 mg daily.  Difficulty quantifying migraine frequency, claims since December, 1 episode a month of migraine that can last 2 to 3 days.  During that time, takes Tylenol every 4-6 hours.  Takes quite frequent Tylenol, for other ailments, low back pain, saw neurosurgery, reports arthritis.  No longer on clonazepam, did not take Fioricet.  Has not wanted to adjust medications.   REVIEW OF SYSTEMS: Full 14 system review of systems performed and notable only for those listed, all others are neg:   See HPI  ALLERGIES: Allergies  Allergen Reactions  . Lisinopril     cough  . Sulfa Antibiotics Other (See Comments)    Unknown, childhood allergy   . Sulfonamide Derivatives Other (See Comments)    UNSURE    HOME MEDICATIONS: Outpatient Medications Prior to Visit  Medication Sig Dispense Refill  . amitriptyline (ELAVIL) 50 MG tablet TAKE 1 TABLET BY MOUTH AT BEDTIME. 90 tablet 1  . aspirin 81 MG tablet Take 81 mg by mouth every morning.    . bisoprolol (ZEBETA) 5 MG tablet TAKE 1 TABLET BY MOUTH DAILY. 90 tablet 3  . dexlansoprazole (DEXILANT) 60 MG capsule Take 1 capsule (60 mg total) by mouth daily. Please schedule an OV for further refills.Thank you 30 capsule 1  . ENTRESTO 49-51 MG TAKE 1 TABLET BY MOUTH 2 TIMES DAILY. 180 tablet 1  . famotidine (PEPCID) 20 MG tablet Take 1 tablet (20 mg total) by mouth  2 (two) times daily. Please schedule a yearly follow up for further refills. Thank you. 60 tablet 1  . furosemide (LASIX) 20 MG tablet Take 2 tablets (40 mg total) by mouth daily. 180 tablet 3  . polyethylene glycol powder (GLYCOLAX/MIRALAX) powder Take 17 g by mouth daily as needed for mild constipation. 255 g 11  . PREMARIN vaginal cream   12  . PROAIR HFA 108 (90 Base) MCG/ACT inhaler 1 puff daily as needed. 8.5 g 4  . rosuvastatin (CRESTOR)  20 MG tablet TAKE 1 TABLET BY MOUTH AT BEDTIME. 90 tablet 0  . topiramate (TOPAMAX) 50 MG tablet Take 1 tablet (50 mg total) by mouth daily. 90 tablet 3  . nitroGLYCERIN (NITROSTAT) 0.4 MG SL tablet Place 1 tablet (0.4 mg total) under the tongue every 5 (five) minutes as needed for chest pain. 25 tablet 11   No facility-administered medications prior to visit.    PAST MEDICAL HISTORY: Past Medical History:  Diagnosis Date  . Abdominal pain, left lower quadrant 08/14/2009   Qualifier: Diagnosis of  By: Laney Potash, Pam    . Abnormal CT scan, stomach   . Abnormal LFTs 06/19/2017  . AICD (automatic cardioverter/defibrillator) present    Dr.Allred follows  . Aortic arch pseudoaneurysm (HCC)    a. followed by Dr. Prescott Gum.  . Arthritis   . Asthma   . Benign neoplasm of colon   . CAD (coronary artery disease) 02/2009   a. anterior STEMI rx with BMS to prox LAD in 02/2009. b. ISR s/p PTCA 06/2009. c. ISR s/p thrombectomy & PTCA 03/2010 due to late stent thrombosis. // Myoview 04/2019: EF 28, ant, ant-sept, inf-sept scar, no ischemia, high risk (stable>>cont med Rx)   . Cardiomyopathy, ischemic 06/12/2011  . Chronic renal insufficiency    stage 3  . Chronic systolic CHF (congestive heart failure) ([place])    a. s/p ST. Jude ICD 10/30/2011.  Marland Kitchen Chronic systolic heart failure ([place]) 08/05/2011  . Colon polyp   . Complication of anesthesia   . COPD (chronic obstructive pulmonary disease) ([place])   . CORONARY ARTERY DISEASE 04/24/2009   Qualifier: Diagnosis of  By: Trellis Paganini PA-c, Amy S   . DDD (degenerative disc disease), lumbar 05/16/2011  . Depression    "since my son died in 30-Nov-2022" 2017/10/29)  . Diverticulosis   . DIVERTICULOSIS-COLON 08/14/2009   Qualifier: Diagnosis of  By: Trellis Paganini PA-c, Amy S   . DYSPHAGIA UNSPECIFIED 04/24/2009   Qualifier: Diagnosis of  By: Laney Potash, Pam    . Dyspnea    "when I lay down at night"   . Essential hypertension 09/05/2009   Qualifier: Diagnosis of  By: Harvest Dark  CMA, Anderson Malta    . Gastric polyp   . GERD 10/19/2007   Qualifier: Diagnosis of  By: Rennie Plowman RN, Blanca Friend: Diagnosis of  By: Laney Potash, Pam    . GERD (gastroesophageal reflux disease)   . GI bleed    a. h/o GIB on DAPT, now on ASA only.  Marland Kitchen Headache 01/03/2013  . Hearing loss    left ear  . History of pulmonary embolus (PE) 01/02/2011  . Hyperlipemia   . Hyperlipidemia, unspecified 04/24/2009   Qualifier: Diagnosis of  By: Trellis Paganini PA-c, Amy S   . Hypertension   . ICD-St.Jude 08/06/2011   S/p St. Jude ICD placement 08/05/11   . Insomnia   . Irritable bowel syndrome   . Ischemic cardiomyopathy    EF 10-15%  . Memory loss   .  Migraine headache without aura   . Myocardial infarct [facility]) 2011 x 2   Dr. Roswell Nickel   . PERSONAL HX COLONIC POLYPS 04/24/2009   Qualifier: Diagnosis of  By: Shane Crutch, Amy S November, 2011 colonoscopy demonstrated a sessile cecal polyp and sigmoid polyp   . Pneumonia   . PONV (postoperative nausea and vomiting)   . Pre-diabetes   . Pseudoaneurysm of aortic arch ([place]) 05/05/2012  . Pulmonary embolism ([place]) 2011  . Stroke [facility])    'When I was young." no residual  . Thoracic aortic aneurysm ([place]) 03/31/2018  . TOBACCO ABUSE 04/04/2009   Qualifier: Diagnosis of  By: Arvid Right  Qualifier: Diagnosis of  By: [name], MD, Jaquelyn Bitter   . Transfusion history    ?'12 or '13  . UNSPECIFIED ANEMIA 07/03/2010   Qualifier: Diagnosis of  By: Shelda Pal    . Unspecified mastoiditis     PAST SURGICAL HISTORY: Past Surgical History:  Procedure Laterality Date  . ANGIOPLASTY  07/02/09, 04/01/10  . BILATERAL SALPINGOOPHORECTOMY    . CAD( bare metal stent)  02/2009   x 1  . CAROTID-SUBCLAVIAN BYPASS GRAFT Left 03/31/2018   Procedure: LEFT SUBCLAVIAN ARTERY BYPASS GRAFT;  Surgeon: Serafina Mitchell, MD;  Location: [facility];  Service: Vascular;  Laterality: Left;  . CERVICAL SPINE SURGERY  08/08  . COLONOSCOPY WITH PROPOFOL N/A 04/29/2016    Procedure: COLONOSCOPY WITH PROPOFOL;  Surgeon: [name], MD;  Location: [facility];  Service: Gastroenterology;  Laterality: N/A;  . ESOPHAGOGASTRODUODENOSCOPY N/A 12/09/2016   Procedure: ESOPHAGOGASTRODUODENOSCOPY (EGD);  Surgeon: [name], MD;  Location: [facility];  Service: Gastroenterology;  Laterality: N/A;  . ESOPHAGOGASTRODUODENOSCOPY (EGD) WITH PROPOFOL N/A 04/29/2016   Procedure: ESOPHAGOGASTRODUODENOSCOPY (EGD) WITH PROPOFOL;  Surgeon: [name], MD;  Location: [facility];  Service: Gastroenterology;  Laterality: N/A;  . EUS N/A 05/15/2016   Procedure: UPPER ENDOSCOPIC ULTRASOUND (EUS) RADIAL;  Surgeon: Milus Banister, MD;  Location: [facility];  Service: Endoscopy;  Laterality: N/A;  . IMPLANTABLE CARDIOVERTER DEFIBRILLATOR IMPLANT N/A 08/05/2011   Primary prevention SJM ICD implanted,  Analyze ST study patient  . INNER EAR SURGERY     left x 17  . LUMBAR DISC SURGERY  02/2008   fusion  . NASAL SEPTUM SURGERY    . THORACIC AORTIC ENDOVASCULAR STENT GRAFT N/A 03/31/2018   Procedure: THORACIC AORTIC ENDOVASCULAR STENT GRAFT;  Surgeon: Serafina Mitchell, MD;  Location: [facility];  Service: Vascular;  Laterality: N/A;  . TOTAL ABDOMINAL HYSTERECTOMY     complete    FAMILY HISTORY: Family History  Problem Relation Age of Onset  . Colon cancer Mother   . Colon cancer Brother   . Bladder Cancer Brother   . Breast cancer Cousin   . Cancer Son     SOCIAL HISTORY: Social History   Socioeconomic History  . Marital status: Divorced    Spouse name: Not on file  . Number of children: 2  . Years of education: 61  . Highest education level: Not on file  Occupational History  . Occupation: Merchandiser, retail: UNEMPLOYED    Employer: DISABLED  Tobacco Use  . Smoking status: Current Some Day Smoker    Packs/day: 0.13    Years: 30.00    Pack years: 3.90    Types: Cigarettes  . Smokeless tobacco: Never Used  . Tobacco comment:  Restarted smoking.  few cig. daily  Vaping Use  . Vaping Use: Never  used  Substance and Sexual Activity  . Alcohol use: No    Alcohol/week: 0.0 standard drinks  . Drug use: No  . Sexual activity: Not on file  Other Topics Concern  . Not on file  Social History Narrative   Pt lives in [place] alone.  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   Patient has 11 th grade education.Right handed.   Caffeine- one cup daily         Social Determinants of Health   Financial Resource Strain: Not on file  Food Insecurity: Not on file  Transportation Needs: Not on file  Physical Activity: Not on file  Stress: Not on file  Social Connections: Not on file  Intimate Partner Violence: Not on file   PHYSICAL EXAM  Vitals:   11/13/20 1350  BP: (!) 141/84  Pulse: 96  Weight: 176 lb (79.8 kg)  Height: 5\' 2"  (1.575 m)   Body mass index is 32.19 kg/m.   PHYSICAL EXAMNIATION: Physical Exam  General: The patient is alert and cooperative at the time of the examination.  Skin: No significant peripheral edema is noted.  Neurologic Exam  Mental status: The patient is alert and oriented x 3 at the time of the examination. Limited historian.   Cranial nerves: Facial symmetry is present. Speech is normal, no aphasia or dysarthria is noted. Extraocular movements are full. Visual fields are full.  Motor: The patient has good strength in all 4 extremities.  Sensory examination: Soft touch sensation is symmetric on the face, arms, and legs.  Coordination: The patient has good finger-nose-finger and heel-to-shin bilaterally.  Gait and station: The patient has a normal gait.   Reflexes: Deep tendon reflexes are symmetric.  DIAGNOSTIC DATA (LABS, IMAGING, TESTING) - I reviewed patient records, labs, notes, testing and imaging myself where available.  Lab Results  Component Value Date   WBC 8.4 04/15/2018   HGB 12.9 04/15/2018   HCT 39.1 04/15/2018   MCV 94.3 04/15/2018   PLT  150 04/01/2018      Component Value Date/Time   NA 142 03/12/2020 1442   K 4.5 03/12/2020 1442   CL 112 (H) 03/12/2020 1442   CO2 16 (L) 03/12/2020 1442   GLUCOSE 102 (H) 03/12/2020 1442   GLUCOSE 122 (H) 04/01/2018 0321   BUN 17 03/12/2020 1442   CREATININE 1.37 (H) 03/12/2020 1442   CREATININE 1.56 (H) 01/15/2016 1345   CALCIUM 8.9 03/12/2020 1442   PROT 7.2 03/12/2020 1442   ALBUMIN 4.7 03/12/2020 1442   AST 16 03/12/2020 1442   ALT 16 03/12/2020 1442   ALKPHOS 110 03/12/2020 1442   BILITOT 0.2 03/12/2020 1442   GFRNONAA 39 (L) 03/12/2020 1442   GFRAA 44 (L) 03/12/2020 1442     Lab Results  Component Value Date   TSH 4.270 03/10/2019   ASSESSMENT AND PLAN  75 y.o. year old female    1. Chronic migraine headaches -Continue Topamax 50 mg daily, amitriptyline 50 mg for migraine prevention -Recommend limiting over-the-counter medications such as Tylenol, only treat severe headaches -If headaches increase, limited on increasing Topamax, due to metabolic acidosis, may consider low-dose gabapentin but creatinine 1.37, GFR 39, CGRP may be good option, but cost may be issue, may tolerate 75 mg amitriptyline? For now, wants to keep things as is -Follow-up in 1 year or sooner if needed  I spent 20 minutes of face-to-face and non-face-to-face time with patient.  This included previsit chart review, lab review, study review, order entry,  electronic health record documentation, patient education.  Evangeline Dakin, DNP  [facility] [address] [place], [place] [address] [phone]

## 2020-11-13 ENCOUNTER — Ambulatory Visit (INDEPENDENT_AMBULATORY_CARE_PROVIDER_SITE_OTHER): Payer: Medicare Other | Admitting: Neurology

## 2020-11-13 ENCOUNTER — Other Ambulatory Visit: Payer: Self-pay

## 2020-11-13 ENCOUNTER — Encounter: Payer: Self-pay | Admitting: Neurology

## 2020-11-13 VITALS — BP 141/84 | HR 96 | Ht 62.0 in | Wt 176.0 lb

## 2020-11-13 DIAGNOSIS — G43009 Migraine without aura, not intractable, without status migrainosus: Secondary | ICD-10-CM

## 2020-11-13 MED ORDER — TOPIRAMATE 50 MG PO TABS: 50.0000 mg | ORAL_TABLET | Freq: Every day | ORAL | 3 refills | Status: DC

## 2020-11-13 NOTE — Patient Instructions (Signed)
Will keep current medications  Only take Tylenol for severe headache  See you back in 1 year

## 2020-12-28 ENCOUNTER — Ambulatory Visit (INDEPENDENT_AMBULATORY_CARE_PROVIDER_SITE_OTHER): Payer: Medicare Other

## 2020-12-28 DIAGNOSIS — I255 Ischemic cardiomyopathy: Secondary | ICD-10-CM | POA: Diagnosis not present

## 2020-12-28 LAB — CUP PACEART REMOTE DEVICE CHECK
Battery Remaining Longevity: 25 mo
Battery Remaining Percentage: 22 %
Battery Voltage: 2.78 V
Brady Statistic RV Percent Paced: 1 %
Date Time Interrogation Session: 20220603043014
HighPow Impedance: 78 Ohm
HighPow Impedance: 78 Ohm
Implantable Lead Implant Date: 20130108
Implantable Lead Location: 753860
Implantable Pulse Generator Implant Date: 20130108
Lead Channel Impedance Value: 490 Ohm
Lead Channel Pacing Threshold Amplitude: 0.75 V
Lead Channel Pacing Threshold Pulse Width: 0.5 ms
Lead Channel Sensing Intrinsic Amplitude: 12 mV
Lead Channel Setting Pacing Amplitude: 2.5 V
Lead Channel Setting Pacing Pulse Width: 0.5 ms
Lead Channel Setting Sensing Sensitivity: 0.5 mV
Pulse Gen Serial Number: 1016523

## 2021-01-09 ENCOUNTER — Other Ambulatory Visit: Payer: Self-pay | Admitting: Cardiovascular Disease

## 2021-01-15 NOTE — Progress Notes (Signed)
Remote ICD transmission.   

## 2021-02-01 ENCOUNTER — Telehealth: Payer: Self-pay | Admitting: *Deleted

## 2021-02-14 ENCOUNTER — Encounter: Payer: Self-pay | Admitting: *Deleted

## 2021-02-14 DIAGNOSIS — Z006 Encounter for examination for normal comparison and control in clinical research program: Secondary | ICD-10-CM

## 2021-02-15 NOTE — Research (Signed)
72M follow up Beat HF  Spoke with patient on phone. She has been doing well. No complaints of chest pains or shortness of breath. No med changes per patient.    Current Outpatient Medications:    amitriptyline (ELAVIL) 50 MG tablet, TAKE 1 TABLET BY MOUTH AT BEDTIME., Disp: 90 tablet, Rfl: 1   aspirin 81 MG tablet, Take 81 mg by mouth every morning., Disp: , Rfl:    bisoprolol (ZEBETA) 5 MG tablet, TAKE 1 TABLET BY MOUTH DAILY., Disp: 90 tablet, Rfl: 3   dexlansoprazole (DEXILANT) 60 MG capsule, Take 1 capsule (60 mg total) by mouth daily. Please schedule an OV for further refills.Thank you, Disp: 30 capsule, Rfl: 1   ENTRESTO 49-51 MG, TAKE 1 TABLET BY MOUTH 2 TIMES DAILY., Disp: 180 tablet, Rfl: 1   famotidine (PEPCID) 20 MG tablet, Take 1 tablet (20 mg total) by mouth 2 (two) times daily. Please schedule a yearly follow up for further refills. Thank you., Disp: 60 tablet, Rfl: 1   furosemide (LASIX) 20 MG tablet, Take 2 tablets (40 mg total) by mouth daily., Disp: 180 tablet, Rfl: 3   nitroGLYCERIN (NITROSTAT) 0.4 MG SL tablet, Place 1 tablet (0.4 mg total) under the tongue every 5 (five) minutes as needed for chest pain., Disp: 25 tablet, Rfl: 11   polyethylene glycol powder (GLYCOLAX/MIRALAX) powder, Take 17 g by mouth daily as needed for mild constipation., Disp: 255 g, Rfl: 11   PREMARIN vaginal cream, , Disp: , Rfl: 12   PROAIR HFA 108 (90 Base) MCG/ACT inhaler, 1 puff daily as needed., Disp: 8.5 g, Rfl: 4   rosuvastatin (CRESTOR) 20 MG tablet, Take 1 tablet (20 mg total) by mouth at bedtime. Please make yearly appt with Dr. Burt Knack for October 2022 for future refills. Thank you 1st attempt, Disp: 90 tablet, Rfl: 0   topiramate (TOPAMAX) 50 MG tablet, Take 1 tablet (50 mg total) by mouth daily., Disp: 90 tablet, Rfl: 3

## 2021-02-15 NOTE — Telephone Encounter (Signed)
Spoke with patient will add a new encounter with note

## 2021-02-19 ENCOUNTER — Encounter: Payer: Self-pay | Admitting: Physician Assistant

## 2021-02-19 ENCOUNTER — Ambulatory Visit (INDEPENDENT_AMBULATORY_CARE_PROVIDER_SITE_OTHER): Payer: Medicare Other | Admitting: Physician Assistant

## 2021-02-19 ENCOUNTER — Other Ambulatory Visit (INDEPENDENT_AMBULATORY_CARE_PROVIDER_SITE_OTHER): Payer: Medicare Other

## 2021-02-19 VITALS — BP 134/70 | HR 85 | Ht 62.0 in | Wt 180.0 lb

## 2021-02-19 DIAGNOSIS — R195 Other fecal abnormalities: Secondary | ICD-10-CM | POA: Diagnosis not present

## 2021-02-19 DIAGNOSIS — R1013 Epigastric pain: Secondary | ICD-10-CM

## 2021-02-19 DIAGNOSIS — R159 Full incontinence of feces: Secondary | ICD-10-CM | POA: Diagnosis not present

## 2021-02-19 DIAGNOSIS — K219 Gastro-esophageal reflux disease without esophagitis: Secondary | ICD-10-CM

## 2021-02-19 DIAGNOSIS — R1032 Left lower quadrant pain: Secondary | ICD-10-CM

## 2021-02-19 LAB — CBC WITH DIFFERENTIAL/PLATELET
Basophils Absolute: 0 10*3/uL (ref 0.0–0.1)
Basophils Relative: 0.4 % (ref 0.0–3.0)
Eosinophils Absolute: 0.2 10*3/uL (ref 0.0–0.7)
Eosinophils Relative: 2 % (ref 0.0–5.0)
HCT: 36.2 % (ref 36.0–46.0)
Hemoglobin: 12 g/dL (ref 12.0–15.0)
Lymphocytes Relative: 39.1 % (ref 12.0–46.0)
Lymphs Abs: 3.3 10*3/uL (ref 0.7–4.0)
MCHC: 33.1 g/dL (ref 30.0–36.0)
MCV: 100.2 fl — ABNORMAL HIGH (ref 78.0–100.0)
Monocytes Absolute: 0.5 10*3/uL (ref 0.1–1.0)
Monocytes Relative: 6 % (ref 3.0–12.0)
Neutro Abs: 4.5 10*3/uL (ref 1.4–7.7)
Neutrophils Relative %: 52.5 % (ref 43.0–77.0)
Platelets: 206 10*3/uL (ref 150.0–400.0)
RBC: 3.61 Mil/uL — ABNORMAL LOW (ref 3.87–5.11)
RDW: 14.9 % (ref 11.5–15.5)
WBC: 8.5 10*3/uL (ref 4.0–10.5)

## 2021-02-19 LAB — COMPREHENSIVE METABOLIC PANEL
ALT: 11 U/L (ref 0–35)
AST: 11 U/L (ref 0–37)
Albumin: 4.1 g/dL (ref 3.5–5.2)
Alkaline Phosphatase: 80 U/L (ref 39–117)
BUN: 33 mg/dL — ABNORMAL HIGH (ref 6–23)
CO2: 20 mEq/L (ref 19–32)
Calcium: 9.7 mg/dL (ref 8.4–10.5)
Chloride: 111 mEq/L (ref 96–112)
Creatinine, Ser: 1.47 mg/dL — ABNORMAL HIGH (ref 0.40–1.20)
GFR: 35.19 mL/min — ABNORMAL LOW (ref >60.00)
Glucose, Bld: 107 mg/dL — ABNORMAL HIGH (ref 70–99)
Potassium: 4.3 mEq/L (ref 3.5–5.1)
Sodium: 141 mEq/L (ref 135–145)
Total Bilirubin: 0.4 mg/dL (ref 0.2–1.2)
Total Protein: 7.2 g/dL (ref 6.0–8.3)

## 2021-02-19 LAB — LIPASE: Lipase: 92 U/L — ABNORMAL HIGH (ref 11.0–59.0)

## 2021-02-19 MED ORDER — DEXLANSOPRAZOLE 60 MG PO CPDR: 1.0000 | DELAYED_RELEASE_CAPSULE | Freq: Every day | ORAL | 11 refills | Status: DC

## 2021-02-19 NOTE — Patient Instructions (Signed)
If you are age 75 or older, your body mass index should be between 23-30. Your Body mass index is 32.92 kg/m. If this is out of the aforementioned range listed, please consider follow up with your Primary Care Provider. __________________________________________________________  The [facility] providers would like to encourage you to use [facility] to communicate with providers for non-urgent requests or questions.  Due to long hold times on the telephone, sending your provider a message by [facility] may be a faster and more efficient way to get a response.  Please allow 48 business hours for a response.  Please remember that this is for non-urgent requests.   You have been scheduled for a CT scan of the abdomen and pelvis at [facility], 1st floor Radiology. You are scheduled on 02/22/2021  at 8:30 am. You should arrive 15 minutes prior to your appointment time for registration.  Please pick up 2 bottles of contrast from [employer] at least 3 days prior to your scan. The solution may taste better if refrigerated, but do NOT add ice or any other liquid to this solution. Shake well before drinking.   Please follow the written instructions below on the day of your exam:   1) Do not eat anything after 4:30 am (4 hours prior to your test)   2) Drink 1 bottle of contrast @ 6:30 am (2 hours prior to your exam)  Remember to shake well before drinking and do NOT pour over ice.     Drink 1 bottle of contrast @ 7:30 am (1 hour prior to your exam)   You may take any medications as prescribed with a small amount of water, if necessary. If you take any of the following medications: METFORMIN, GLUCOPHAGE, GLUCOVANCE, AVANDAMET, RIOMET, FORTAMET, [place] MET, JANUMET, GLUMETZA or METAGLIP, you MAY be asked to HOLD this medication 48 hours AFTER the exam.   The purpose of you drinking the oral contrast is to aid in the visualization of your intestinal tract. The contrast solution may cause some diarrhea.  Depending on your individual set of symptoms, you may also receive an intravenous injection of x-ray contrast/dye. Plan on being at [facility] for 45 minutes or longer, depending on the type of exam you are having performed.   If you have any questions regarding your exam or if you need to reschedule, you may call [facility] at [phone] between the hours of 8:00 am and 5:00 pm, Monday-Friday.   Your provider has requested that you go to the basement level for lab work before leaving today. Press "B" on the elevator. The lab is located at the first door on the left as you exit the elevator.  Continue Dexilant 1 capsule every morning.  Use 1 scoop of Benefiber in a glass of water to help bulk up stools.  We have sent a referral for you have Pelvic Floor Physical Therapy. Please allow 2 weeks for them to contact you to schedule an appointment.  Follow up pending the results of your CT and Labs.  Thank you for entrusting me with your care and choosing [facility].  Amy Esterwood, PA-C

## 2021-02-19 NOTE — Progress Notes (Signed)
Subjective:    Patient ID: [name], female    DOB: 1945/11/16, 75 y.o.   MRN: [id number]  HPI [name] is a pleasant 75 year old white female, known to Dr. Havery Moros who was last seen in our office in January 2020.  She comes in today with 2 to 72-monthhistory of intermittent left lower quadrant abdominal pain, and complaints of upper abdominal pain. Patient has multiple comorbidities including hypertension, coronary artery disease, congestive heart failure with most recent EF 25 to 30%.  She is status post ICD and followed by Dr. ARayann Heman COPD, prior history of PE, GERD, IBS, diverticulosis and history of colon polyps.  She also has remote history of peptic ulcer disease. She last had colonoscopy in 2017 with finding of a single AVM in the right colon and a few diverticuli in the left colon as well as internal hemorrhoids.  There was one 3 mm polyp removed which was hyperplastic. Last EGD May 2018 showed healed esophagitis, reactive gastropathy and she had 2 small duodenal polyps removed with benign path. At the time of her last office visit she was being maintained on Dexilant 60 mg daily, was to continue Carafate at bedtime and Pepcid 20 mg twice a day was added. Today she tells me she has not been taking Dexilant or Pepcid for multiple months.  She says she stopped them sometime ago because she was not having any stomach problems.  She does take a baby aspirin daily, denies any NSAID use.  She says she has been having epigastric discomfort and occasional heartburn.  She seems to have more discomfort at nighttime no dysphagia or odynophagia, no nausea or vomiting.  He is unable to really describe her pain other than to say it is uncomfortable, and she wonders if she might have an ulcer. She also relates a nagging discomfort in the left lower quadrant.  She has not had any change in this pain with or without bowel movements.  She says she does not necessarily have a bowel movement every day,  takes a dose of MiraLAX every 2 to 3 days, and says if she does not take the MiraLAX then she will get very constipated.  However she also says that most of her stools are very loose and she is having very frequent episodes of fecal incontinence over the past couple of years, without any warning or sensation of urgency.  No melena or hematochezia. No recent abdominal imaging. She is status post thoracic aortic endovascular stent grafting September 2019, and total abdominal hysterectomy.  Review of Systems Pertinent positive and negative review of systems were noted in the above HPI section.  All other review of systems was otherwise negative.   Outpatient Encounter Medications as of 02/19/2021  Medication Sig   amitriptyline (ELAVIL) 50 MG tablet TAKE 1 TABLET BY MOUTH AT BEDTIME.   aspirin 81 MG tablet Take 81 mg by mouth every morning.   bisoprolol (ZEBETA) 5 MG tablet TAKE 1 TABLET BY MOUTH DAILY.   ENTRESTO 49-51 MG TAKE 1 TABLET BY MOUTH 2 TIMES DAILY.   famotidine (PEPCID) 20 MG tablet Take 1 tablet (20 mg total) by mouth 2 (two) times daily. Please schedule a yearly follow up for further refills. Thank you.   furosemide (LASIX) 20 MG tablet Take 2 tablets (40 mg total) by mouth daily.   nitroGLYCERIN (NITROSTAT) 0.4 MG SL tablet Place 1 tablet (0.4 mg total) under the tongue every 5 (five) minutes as needed for chest pain.  polyethylene glycol powder (GLYCOLAX/MIRALAX) powder Take 17 g by mouth daily as needed for mild constipation.   PREMARIN vaginal cream    PROAIR HFA 108 (90 Base) MCG/ACT inhaler 1 puff daily as needed.   rosuvastatin (CRESTOR) 20 MG tablet Take 1 tablet (20 mg total) by mouth at bedtime. Please make yearly appt with Dr. Burt Knack for October 2022 for future refills. Thank you 1st attempt   topiramate (TOPAMAX) 50 MG tablet Take 1 tablet (50 mg total) by mouth daily.   [DISCONTINUED] dexlansoprazole (DEXILANT) 60 MG capsule Take 1 capsule (60 mg total) by mouth daily.  Please schedule an OV for further refills.Thank you   dexlansoprazole (DEXILANT) 60 MG capsule Take 1 capsule (60 mg total) by mouth daily.   No facility-administered encounter medications on file as of 02/19/2021.   Allergies  Allergen Reactions   Lisinopril     cough   Sulfa Antibiotics Other (See Comments)    Unknown, childhood allergy    Sulfonamide Derivatives Other (See Comments)    UNSURE   Patient Active Problem List   Diagnosis Date Noted   Chronic insomnia 11/14/2019   Grief at loss of child 08/05/2018   Colon polyp 06/16/2018   Thoracic aortic aneurysm ([facility]) 03/31/2018   Chronic renal insufficiency    AICD (automatic cardioverter/defibrillator) present    Chronic systolic CHF (congestive heart failure) ([facility])    COPD (chronic obstructive pulmonary disease) ([facility])    Hyperlipemia    Insomnia    Pre-diabetes 06/19/2017   Gastric polyp    Osteoporosis 05/11/2013   Hearing loss 04/29/2013   Mild cognitive impairment 04/29/2013   Headache 01/03/2013   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   ICD-St[employer] 08/06/2011   Cardiomyopathy, ischemic 06/12/2011   DDD (degenerative disc disease), lumbar 05/16/2011   History of pulmonary embolus (PE) 01/02/2011   UNSPECIFIED ANEMIA 07/03/2010   Essential hypertension 09/05/2009   DIVERTICULOSIS-COLON 08/14/2009   Hyperlipidemia, unspecified 04/24/2009   DYSPHAGIA UNSPECIFIED 04/24/2009   PERSONAL HX COLONIC POLYPS 04/24/2009   TOBACCO ABUSE 04/04/2009   CAD (coronary artery disease) 02/25/2009   GERD 10/19/2007   IBS 10/19/2007   COLONIC POLYPS, ADENOMATOUS 05/12/2007   Social History   Socioeconomic History   Marital status: Divorced    Spouse name: Not on file   Number of children: 2   Years of education: 11   Highest education level: Not on file  Occupational History   Occupation: Merchandiser, retail: UNEMPLOYED    Employer: DISABLED  Tobacco Use   Smoking status: Former    Packs/day: 0.13     Years: 30.00    Pack years: 3.90    Types: Cigarettes   Smokeless tobacco: Never  Vaping Use   Vaping Use: Never used  Substance and Sexual Activity   Alcohol use: No    Alcohol/week: 0.0 standard drinks   Drug use: No   Sexual activity: Not on file  Other Topics Concern   Not on file  Social History Narrative   Pt lives in [place] alone.  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   Patient has 11 th grade education.Right handed.   Caffeine- one cup daily         Social Determinants of Health   Financial Resource Strain: Not on file  Food Insecurity: Not on file  Transportation Needs: Not on file  Physical Activity: Not on file  Stress: Not on file  Social Connections: Not on file  Intimate Partner Violence: Not  on file    Ms. Discher's family history includes Bladder Cancer in her brother; Breast cancer in her cousin; Cancer in her son; Colon cancer in her mother; Other in her brother; Throat cancer in her brother.      Objective:    Vitals:   02/19/21 1122  BP: 134/70  Pulse: 85    Physical Exam Well-developed well-nourished older WF  in no acute distress.  Height, Weight,180 BMI 32.9  HEENT; nontraumatic normocephalic, EOMI, PE R LA, sclera anicteric. Oropharynx; not done Neck; supple, no JVD Cardiovascular; regular rate and rhythm with S1-S2, no murmur rub or gallop Pulmonary; Clear bilaterally Abdomen; soft, she is tender in the epigastrium and hypogastrium, nondistended, no palpable mass , no definite hepatosplenomegaly, bowel sounds are active, there is also tenderness in the left mid and left lower quadrant, no palpable mass, no rebound Rectal; heme-negative stool, small external hemorrhoids decreased anal sphincter tone/decreased squeeze Skin; benign exam, no jaundice rash or appreciable lesions Extremities; no clubbing cyanosis or edema skin warm and dry Neuro/Psych; alert and oriented x4, grossly nonfocal mood and affect appropriate         Assessment & Plan:   #53 75 year old white female with 2 to 62-monthhistory of epigastric pain, and left lower quadrant/left mid quadrant pain. No associated nausea or vomiting, positive history of intermittent heartburn and indigestion and some increase in epigastric discomfort at night. Patient has history of chronic GERD and remote history of ulcer disease and has been off of PPI and H2 blocker over the past multiple months to a year. Rule out gastritis, peptic ulcer disease, symptoms secondary to GERD, rule out occult malignancy, other intra-abdominal inflammatory process  Regarding the left lower abdominal pain, she is definitely tender on exam, she does have history of diverticular disease however says that her current discomfort is been present over 2 to 3 months so doubt acute diverticulitis, rule out symptoms secondary to IBS, rule out smoldering diverticulitis, rule out other intra-abdominal neoplasm or inflammatory process  #2 fecal incontinence-frequent in setting of looser stool-decreased anal sphincter tone on exam suspect pelvic floor laxity  #3 history of colonic AVM right colon #4 ischemic cardiomyopathy with EF 25 to 30% status post ICD placement #5  Congestive heart failure #6 COPD #7 Coronary artery disease #8  Status post thoracic aortic aneurysm repair 2019 #9 Prior history of PE #10 history of migraines-on Topamax  Plan; CBC with differential, c-Met, lipase Patient will be scheduled for CT of the abdomen and pelvis with contrast. Restart Dexilant 60 mg p.o. every morning AC breakfast. Start Benefiber 1 scoop daily in a glass of water to bulk stool Discussed pelvic floor physical therapy and patient would like to be referred, will initiate. Okay to continue MiraLAX 17 g, 1 dose every 2 to 3 days If above work-up is unrevealing and symptoms are persisting may need EGD which would need to be done at the hospital. Further recommendations pending results of  above.   Burk Hoctor S Ladavia Lindenbaum PA-C 02/19/2021   [place]

## 2021-02-20 NOTE — Progress Notes (Signed)
Agree with assessment and plan as outlined.  

## 2021-02-22 ENCOUNTER — Ambulatory Visit (HOSPITAL_COMMUNITY): Payer: Medicare Other

## 2021-02-25 ENCOUNTER — Other Ambulatory Visit: Payer: Medicare Other

## 2021-02-28 ENCOUNTER — Ambulatory Visit (HOSPITAL_COMMUNITY)
Admission: RE | Admit: 2021-02-28 | Discharge: 2021-02-28 | Disposition: A | Discharge status: Home / Self Care | Payer: Medicare Other | Source: Ambulatory Visit | Attending: Physician Assistant | Admitting: Physician Assistant

## 2021-02-28 ENCOUNTER — Other Ambulatory Visit: Payer: Self-pay

## 2021-02-28 DIAGNOSIS — K219 Gastro-esophageal reflux disease without esophagitis: Secondary | ICD-10-CM | POA: Insufficient documentation

## 2021-02-28 DIAGNOSIS — R159 Full incontinence of feces: Secondary | ICD-10-CM | POA: Insufficient documentation

## 2021-02-28 DIAGNOSIS — R195 Other fecal abnormalities: Secondary | ICD-10-CM | POA: Insufficient documentation

## 2021-02-28 DIAGNOSIS — R1032 Left lower quadrant pain: Secondary | ICD-10-CM | POA: Insufficient documentation

## 2021-02-28 DIAGNOSIS — R1013 Epigastric pain: Secondary | ICD-10-CM | POA: Diagnosis present

## 2021-02-28 MED ORDER — IOHEXOL 350 MG/ML SOLN
80.0000 mL | Freq: Once | INTRAVENOUS | Status: AC | PRN
  Administered 2021-02-28: 80 mL via INTRAVENOUS

## 2021-03-27 ENCOUNTER — Ambulatory Visit (INDEPENDENT_AMBULATORY_CARE_PROVIDER_SITE_OTHER): Payer: Medicare Other | Admitting: Emergency Medicine

## 2021-03-27 ENCOUNTER — Other Ambulatory Visit: Payer: Self-pay

## 2021-03-27 ENCOUNTER — Encounter: Payer: Self-pay | Admitting: Emergency Medicine

## 2021-03-27 VITALS — BP 136/82 | HR 83 | Temp 98.3°F | Ht 62.0 in | Wt 181.0 lb

## 2021-03-27 DIAGNOSIS — I5022 Chronic systolic (congestive) heart failure: Secondary | ICD-10-CM

## 2021-03-27 DIAGNOSIS — Z23 Encounter for immunization: Secondary | ICD-10-CM | POA: Diagnosis not present

## 2021-03-27 DIAGNOSIS — G43009 Migraine without aura, not intractable, without status migrainosus: Secondary | ICD-10-CM

## 2021-03-27 DIAGNOSIS — J449 Chronic obstructive pulmonary disease, unspecified: Secondary | ICD-10-CM

## 2021-03-27 DIAGNOSIS — I25119 Atherosclerotic heart disease of native coronary artery with unspecified angina pectoris: Secondary | ICD-10-CM | POA: Diagnosis not present

## 2021-03-27 DIAGNOSIS — Z95810 Presence of automatic (implantable) cardiac defibrillator: Secondary | ICD-10-CM

## 2021-03-27 DIAGNOSIS — I712 Thoracic aortic aneurysm, without rupture, unspecified: Secondary | ICD-10-CM

## 2021-03-27 DIAGNOSIS — I10 Essential (primary) hypertension: Secondary | ICD-10-CM

## 2021-03-27 DIAGNOSIS — I255 Ischemic cardiomyopathy: Secondary | ICD-10-CM

## 2021-03-27 MED ORDER — AMITRIPTYLINE HCL 50 MG PO TABS: 50.0000 mg | ORAL_TABLET | Freq: Every day | ORAL | 1 refills | Status: DC

## 2021-03-27 MED ORDER — PROAIR HFA 108 (90 BASE) MCG/ACT IN AERS: INHALATION_SPRAY | RESPIRATORY_TRACT | 4 refills | Status: DC

## 2021-03-27 NOTE — Telephone Encounter (Signed)
Patient was seen in office today and requested a refill for amitriptyline and pro air inhaler.

## 2021-03-27 NOTE — Assessment & Plan Note (Signed)
Well-controlled hypertension. BP Readings from Last 3 Encounters:  03/27/21 136/82  02/19/21 134/70  11/13/20 (!) 141/84  Continue present medications.  No changes. Patient on Entresto, bisoprolol and Lasix

## 2021-03-27 NOTE — Progress Notes (Signed)
[name] 75 y.o.   Chief Complaint  Patient presents with   Follow-up    Chronic conditions. Medication refill of amitriptyline and pro air inhaler. Pt states yesterday she felt SOB after walking from her care to house.    HISTORY OF PRESENT ILLNESS: This is a 75 y.o. female here for follow-up on chronic medical problems. Multiple chronic medical problems including the following: #1 ischemic cardiomyopathy status post MI x2 in the past, AICD in place. #2 history of COPD #3 repair of thoracic aortic aneurysm #4 chronic kidney disease #5 dyslipidemia #6 prediabetes #7 degenerative disc disease of her spine with chronic back pain #8 GERD #9 former smoker, quit 6 months ago. Yesterday had episode of chest pressure/tightness as she was walking out of a store.  Was able to get in her car and drive back home.  Once she started unloading the car and walking towards the house pressure got worse along with difficulty breathing.  Sat down on the porch, felt better, and went inside the house.  After a period of about 10 minutes she felt back to normal.  She has had similar episodes in the past almost always associated with exertion.  HPI   Prior to Admission medications   Medication Sig Start Date End Date Taking? Authorizing Provider  amitriptyline (ELAVIL) 50 MG tablet TAKE 1 TABLET BY MOUTH AT BEDTIME. 10/02/20  Yes Annaliese Saez, Ines Bloomer, MD  aspirin 81 MG tablet Take 81 mg by mouth every morning.   Yes [provider]  bisoprolol (ZEBETA) 5 MG tablet TAKE 1 TABLET BY MOUTH DAILY. 05/21/20  Yes [name], MD  dexlansoprazole (DEXILANT) 60 MG capsule Take 1 capsule (60 mg total) by mouth daily. 02/19/21  Yes Esterwood, Amy S, PA-C  ENTRESTO 49-51 MG TAKE 1 TABLET BY MOUTH 2 TIMES DAILY. 10/01/20  Yes [name], MD  famotidine (PEPCID) 20 MG tablet Take 1 tablet (20 mg total) by mouth 2 (two) times daily. Please schedule a yearly follow up for further refills. Thank  you. 01/23/20  Yes Armbruster, Carlota Raspberry, MD  furosemide (LASIX) 20 MG tablet Take 2 tablets (40 mg total) by mouth daily. 05/12/19  Yes Weaver, Scott T, PA-C  polyethylene glycol powder (GLYCOLAX/MIRALAX) powder Take 17 g by mouth daily as needed for mild constipation. 07/08/17  Yes Jacelyn Pi, Lilia Argue, MD  PREMARIN vaginal cream  05/25/18  Yes [provider]  PROAIR HFA 108 (90 Base) MCG/ACT inhaler 1 puff daily as needed. 08/20/20  Yes Just, Laurita Quint, FNP  rosuvastatin (CRESTOR) 20 MG tablet Take 1 tablet (20 mg total) by mouth at bedtime. Please make yearly appt with Dr. Burt Knack for October 2022 for future refills. Thank you 1st attempt 01/09/21  Yes [name], MD  topiramate (TOPAMAX) 50 MG tablet Take 1 tablet (50 mg total) by mouth daily. 11/13/20  Yes Suzzanne Cloud, NP  nitroGLYCERIN (NITROSTAT) 0.4 MG SL tablet Place 1 tablet (0.4 mg total) under the tongue every 5 (five) minutes as needed for chest pain. 03/29/19 02/19/21  Richardson Dopp T, PA-C    Allergies  Allergen Reactions   Lisinopril     cough   Sulfa Antibiotics Other (See Comments)    Unknown, childhood allergy    Sulfonamide Derivatives Other (See Comments)    UNSURE    Patient Active Problem List   Diagnosis Date Noted   Chronic insomnia 11/14/2019   Grief at loss of child 08/05/2018   Colon polyp 06/16/2018  Thoracic aortic aneurysm ([place]) 03/31/2018   Chronic renal insufficiency    AICD (automatic cardioverter/defibrillator) present    Chronic systolic CHF (congestive heart failure) (HCC)    COPD (chronic obstructive pulmonary disease) ([place])    Hyperlipemia    Insomnia    Pre-diabetes 06/19/2017   Gastric polyp    Osteoporosis 05/11/2013   Hearing loss 04/29/2013   Mild cognitive impairment 04/29/2013   Headache 01/03/2013   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   ICD-St.Jude 08/06/2011   Cardiomyopathy, ischemic 06/12/2011   DDD (degenerative disc disease), lumbar 05/16/2011    History of pulmonary embolus (PE) 01/02/2011   UNSPECIFIED ANEMIA 07/03/2010   Essential hypertension 09/05/2009   DIVERTICULOSIS-COLON 08/14/2009   Hyperlipidemia, unspecified 04/24/2009   DYSPHAGIA UNSPECIFIED 04/24/2009   PERSONAL HX COLONIC POLYPS 04/24/2009   TOBACCO ABUSE 04/04/2009   CAD (coronary artery disease) 02/25/2009   GERD 10/19/2007   IBS 10/19/2007   COLONIC POLYPS, ADENOMATOUS 05/12/2007    Past Medical History:  Diagnosis Date   Abdominal pain, left lower quadrant 08/14/2009   Qualifier: Diagnosis of  By: Laney Potash, Pam     Abnormal CT scan, stomach    Abnormal LFTs 06/19/2017   AICD (automatic cardioverter/defibrillator) present    Dr.Allred follows   Aortic arch pseudoaneurysm ([place])    a. followed by Dr. Prescott Gum.   Arthritis    Asthma    Benign neoplasm of colon    CAD (coronary artery disease) 02/2009   a. anterior STEMI rx with BMS to prox LAD in 02/2009. b. ISR s/p PTCA 06/2009. c. ISR s/p thrombectomy & PTCA 03/2010 due to late stent thrombosis. // Myoview 04/2019: EF 28, ant, ant-sept, inf-sept scar, no ischemia, high risk (stable>>cont med Rx)    Cardiomyopathy, ischemic 06/12/2011   Chronic renal insufficiency    stage 3   Chronic systolic CHF (congestive heart failure) ([place])    a. s/p ST. Jude ICD 11/09/2011.   Chronic systolic heart failure ([place]) 08/05/2011   Colon polyp    Complication of anesthesia    COPD (chronic obstructive pulmonary disease) ([place])    CORONARY ARTERY DISEASE 04/24/2009   Qualifier: Diagnosis of  By: Shane Crutch, Amy S    DDD (degenerative disc disease), lumbar 05/16/2011   Depression    "since my son died in 12-10-2022" 11/08/2017)   Diverticulosis    DIVERTICULOSIS-COLON 08/14/2009   Qualifier: Diagnosis of  By: Trellis Paganini PA-c, Amy S    DYSPHAGIA UNSPECIFIED 04/24/2009   Qualifier: Diagnosis of  By: Laney Potash, Pam     Dyspnea    "when I lay down at night"    Essential hypertension 09/05/2009   Qualifier: Diagnosis of  By:  Harvest Dark CMA, Jennifer     Gastric polyp    GERD 10/19/2007   Qualifier: Diagnosis of  By: Rennie Plowman RN, Blanca Friend: Diagnosis of  By: Laney Potash, Pam     GERD (gastroesophageal reflux disease)    GI bleed    a. h/o GIB on DAPT, now on ASA only.   Headache 01/03/2013   Hearing loss    left ear   History of pulmonary embolus (PE) 01/02/2011   Hyperlipemia    Hyperlipidemia, unspecified 04/24/2009   Qualifier: Diagnosis of  By: Trellis Paganini PA-c, Amy S    Hypertension    ICD-St.Jude 08/06/2011   S/p St. Jude ICD placement 08/05/11    Insomnia    Irritable bowel syndrome    Ischemic cardiomyopathy    EF 10-15%  Memory loss    Migraine headache without aura    Myocardial infarct ([place]) 2011 x 2   Dr. 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   Pulmonary embolism ([place]) 2011   Stroke [facility])    'When I was young." no residual   Thoracic aortic aneurysm ([place]) 03/31/2018   TOBACCO ABUSE 04/04/2009   Qualifier: Diagnosis of  By: Arvid Right  Qualifier: Diagnosis of  By: [name], MD, Jaquelyn Bitter    Transfusion history    ?'12 or '13   UNSPECIFIED ANEMIA 07/03/2010   Qualifier: Diagnosis of  By: Shelda Pal     Unspecified mastoiditis     Past Surgical History:  Procedure Laterality Date   ANGIOPLASTY  07/02/09, 04/01/10   BILATERAL SALPINGOOPHORECTOMY     CAD( bare metal stent)  02/2009   x 1   CAROTID-SUBCLAVIAN BYPASS GRAFT Left 03/31/2018   Procedure: LEFT SUBCLAVIAN ARTERY BYPASS GRAFT;  Surgeon: Serafina Mitchell, MD;  Location: [facility] OR;  Service: Vascular;  Laterality: Left;   CERVICAL SPINE SURGERY  08/08   COLONOSCOPY WITH PROPOFOL N/A 04/29/2016   Procedure: COLONOSCOPY WITH PROPOFOL;  Surgeon:  [name], MD;  Location: [facility] ENDOSCOPY;  Service: Gastroenterology;  Laterality: N/A;   ESOPHAGOGASTRODUODENOSCOPY N/A 12/09/2016   Procedure: ESOPHAGOGASTRODUODENOSCOPY (EGD);  Surgeon: [name], MD;  Location: [facility];  Service: Gastroenterology;  Laterality: N/A;   ESOPHAGOGASTRODUODENOSCOPY (EGD) WITH PROPOFOL N/A 04/29/2016   Procedure: ESOPHAGOGASTRODUODENOSCOPY (EGD) WITH PROPOFOL;  Surgeon: [name], MD;  Location: [facility] ENDOSCOPY;  Service: Gastroenterology;  Laterality: N/A;   EUS N/A 05/15/2016   Procedure: UPPER ENDOSCOPIC ULTRASOUND (EUS) RADIAL;  Surgeon: Milus Banister, MD;  Location: [facility] ENDOSCOPY;  Service: Endoscopy;  Laterality: N/A;   IMPLANTABLE CARDIOVERTER DEFIBRILLATOR IMPLANT N/A 08/05/2011   Primary prevention SJM ICD implanted,  Analyze ST study patient   INNER EAR SURGERY     left x 17   LUMBAR DISC SURGERY  02/2008   fusion   NASAL SEPTUM SURGERY     THORACIC AORTIC ENDOVASCULAR STENT GRAFT N/A 03/31/2018   Procedure: THORACIC AORTIC ENDOVASCULAR STENT GRAFT;  Surgeon: Serafina Mitchell, MD;  Location: [facility] OR;  Service: Vascular;  Laterality: N/A;   TOTAL ABDOMINAL HYSTERECTOMY     complete    Social History   Socioeconomic History   Marital status: Divorced    Spouse name: Not on file   Number of children: 2   Years of education: 11   Highest education level: Not on file  Occupational History   Occupation: Unemployed    Employer: UNEMPLOYED    Employer: DISABLED  Tobacco Use   Smoking status: Former    Packs/day: 0.13    Years: 30.00    Pack years: 3.90    Types: Cigarettes   Smokeless tobacco: Never  Vaping Use   Vaping Use: Never used  Substance and Sexual Activity   Alcohol use: No    Alcohol/week: 0.0 standard drinks   Drug use: No   Sexual activity: Not on file  Other Topics Concern   Not on file  Social History Narrative   Pt lives in [place] alone.  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   Patient has 11 th grade education.Right handed.   Caffeine- one cup daily         Social Determinants of Health   Financial Resource Strain: Not on file  Food Insecurity: Not on file  Transportation Needs: Not on file  Physical Activity: Not on file  Stress: Not on file  Social Connections: Not on file  Intimate Partner Violence: Not on file    Family History  Problem Relation Age of Onset   Colon cancer Mother    Other Brother        tumor in his brain   Bladder Cancer Brother    Throat cancer Brother    Cancer Son        kidney, spread to his liver   Breast cancer Cousin      Review of Systems  Constitutional: Negative.  Negative for chills and fever.  HENT: Negative.  Negative for congestion and sore throat.   Respiratory: Negative.  Negative for cough and shortness of breath.   Cardiovascular:  Negative for palpitations.  Gastrointestinal:  Negative for abdominal pain, nausea and vomiting.  Genitourinary: Negative.  Negative for dysuria and hematuria.  Skin: Negative.  Negative for rash.  All other systems reviewed and are negative.  Today's Vitals   03/27/21 1342  BP: 136/82  Pulse: 83  Temp: 98.3 F (36.8 C)  TempSrc: Oral  SpO2: 96%  Weight: 181 lb (82.1 kg)  Height: 5\' 2"  (1.575 m)   Body mass index is 33.11 kg/m.  Physical Exam Vitals reviewed.  Constitutional:      Appearance: Normal appearance.  HENT:     Head: Normocephalic.  Eyes:     Extraocular Movements: Extraocular movements intact.     Pupils: Pupils are equal, round, and reactive to light.  Cardiovascular:     Rate and Rhythm: Normal rate and regular rhythm.     Pulses: Normal pulses.     Heart sounds: Normal heart sounds.  Pulmonary:     Effort: Pulmonary effort is normal.     Breath sounds: Normal breath sounds.  Musculoskeletal:     Cervical back: Normal range of motion.     Right lower leg: No edema.     Left lower leg: No edema.  Skin:    General: Skin is  warm and dry.  Neurological:     General: No focal deficit present.     Mental Status: She is alert and oriented to person, place, and time.  Psychiatric:        Mood and Affect: Mood normal.        Behavior: Behavior normal.   EKG: Normal sinus rhythm with ventricular rate of 73/min.  No acute ischemic changes.  ASSESSMENT & PLAN: Chronic systolic CHF (congestive heart failure) (HCC) Euvolemic.  Stable.  Continue Entresto and bisoprolol and furosemide  Essential hypertension Well-controlled hypertension. BP Readings from Last 3 Encounters:  03/27/21 136/82  02/19/21 134/70  11/13/20 (!) 141/84  Continue present medications.  No changes. Patient on Entresto, bisoprolol and Lasix   COPD (chronic obstructive pulmonary disease) (HCC) Stable.  Continue ProAir inhaler as needed.  CAD (coronary artery disease) Likely having anginal episodes.  Needs follow-up with cardiologist for further evaluation and testing. Adalee was seen today for follow-up.  Diagnoses and all orders for this visit:  Coronary artery disease involving native coronary artery of native heart with angina pectoris ([place]) -     EKG 12-Lead  AICD (automatic cardioverter/defibrillator) present  Need  for influenza vaccination -     Flu Vaccine QUAD High Dose(Fluad)  Thoracic aortic aneurysm without rupture (HCC)  Chronic obstructive pulmonary disease, unspecified COPD type ([place]) -     PROAIR HFA 108 (90 Base) MCG/ACT inhaler; 1 puff daily as needed.  Chronic systolic CHF (congestive heart failure) (HCC)  Essential hypertension  Cardiomyopathy, ischemic  Migraine without aura and without status migrainosus, not intractable -     amitriptyline (ELAVIL) 50 MG tablet; Take 1 tablet (50 mg total) by mouth at bedtime.  Patient Instructions  Health Maintenance After Age 36 After age 54, you are at a higher risk for certain long-term diseases and infections as well as injuries from falls. Falls are a major  cause of broken bones and head injuries in people who are older than age 13. Getting regular preventive care can help to keep you healthy and well. Preventive care includes getting regular testing and making lifestyle changes as recommended by your health care provider. Talk with your health care provider about: Which screenings and tests you should have. A screening is a test that checks for a disease when you have no symptoms. A diet and exercise plan that is right for you. What should I know about screenings and tests to prevent falls? Screening and testing are the best ways to find a health problem early. Early diagnosis and treatment give you the best chance of managing medical conditions that are common after age 34. Certain conditions and lifestyle choices may make you more likely to have a fall. Your health care provider may recommend: Regular vision checks. Poor vision and conditions such as cataracts can make you more likely to have a fall. If you wear glasses, make sure to get your prescription updated if your vision changes. Medicine review. Work with your health care provider to regularly review all of the medicines you are taking, including over-the-counter medicines. Ask your health care provider about any side effects that may make you more likely to have a fall. Tell your health care provider if any medicines that you take make you feel dizzy or sleepy. Osteoporosis screening. Osteoporosis is a condition that causes the bones to get weaker. This can make the bones weak and cause them to break more easily. Blood pressure screening. Blood pressure changes and medicines to control blood pressure can make you feel dizzy. Strength and balance checks. Your health care provider may recommend certain tests to check your strength and balance while standing, walking, or changing positions. Foot health exam. Foot pain and numbness, as well as not wearing proper footwear, can make you more likely to  have a fall. Depression screening. You may be more likely to have a fall if you have a fear of falling, feel emotionally low, or feel unable to do activities that you used to do. Alcohol use screening. Using too much alcohol can affect your balance and may make you more likely to have a fall. What actions can I take to lower my risk of falls? General instructions Talk with your health care provider about your risks for falling. Tell your health care provider if: You fall. Be sure to tell your health care provider about all falls, even ones that seem minor. You feel dizzy, sleepy, or off-balance. Take over-the-counter and prescription medicines only as told by your health care provider. These include any supplements. Eat a healthy diet and maintain a healthy weight. A healthy diet includes low-fat dairy products, low-fat (lean) meats, and fiber from whole  grains, beans, and lots of fruits and vegetables. Home safety Remove any tripping hazards, such as rugs, cords, and clutter. Install safety equipment such as grab bars in bathrooms and safety rails on stairs. Keep rooms and walkways well-lit. Activity  Follow a regular exercise program to stay fit. This will help you maintain your balance. Ask your health care provider what types of exercise are appropriate for you. If you need a cane or walker, use it as recommended by your health care provider. Wear supportive shoes that have nonskid soles. Lifestyle Do not drink alcohol if your health care provider tells you not to drink. If you drink alcohol, limit how much you have: 0-1 drink a day for women. 0-2 drinks a day for men. Be aware of how much alcohol is in your drink. In the [place] one drink equals one typical bottle of beer (12 oz), one-half glass of wine (5 oz), or one shot of hard liquor (1 oz). Do not use any products that contain nicotine or tobacco, such as cigarettes and e-cigarettes. If you need help quitting, ask your health care  provider. Summary Having a healthy lifestyle and getting preventive care can help to protect your health and wellness after age 71. Screening and testing are the best way to find a health problem early and help you avoid having a fall. Early diagnosis and treatment give you the best chance for managing medical conditions that are more common for people who are older than age 54. Falls are a major cause of broken bones and head injuries in people who are older than age 39. Take precautions to prevent a fall at home. Work with your health care provider to learn what changes you can make to improve your health and wellness and to prevent falls. This information is not intended to replace advice given to you by your health care provider. Make sure you discuss any questions you have with your health care provider. Document Revised: 09/21/2020 Document Reviewed: 06/29/2020 Elsevier Patient Education  2022 [place], [place] [facility]

## 2021-03-27 NOTE — Assessment & Plan Note (Signed)
Stable.  Continue ProAir inhaler as needed.

## 2021-03-27 NOTE — Assessment & Plan Note (Signed)
Likely having anginal episodes.  Needs follow-up with cardiologist for further evaluation and testing.

## 2021-03-27 NOTE — Assessment & Plan Note (Signed)
Euvolemic.  Stable.  Continue Entresto and bisoprolol and furosemide

## 2021-03-27 NOTE — Patient Instructions (Addendum)
Health Maintenance After Age 73 After age 73, you are at a higher risk for certain long-term diseases and infections as well as injuries from falls. Falls are a major cause of broken bones and head injuries in people who are older than age 73. Getting regular preventive care can help to keep you healthy and well. Preventive care includes getting regular testing and making lifestyle changes as recommended by your health care provider. Talk with your health care provider about: Which screenings and tests you should have. A screening is a test that checks for a disease when you have no symptoms. A diet and exercise plan that is right for you. What should I know about screenings and tests to prevent falls? Screening and testing are the best ways to find a health problem early. Early diagnosis and treatment give you the best chance of managing medical conditions that are common after age 73. Certain conditions and lifestyle choices may make you more likely to have a fall. Your health care provider may recommend: Regular vision checks. Poor vision and conditions such as cataracts can make you more likely to have a fall. If you wear glasses, make sure to get your prescription updated if your vision changes. Medicine review. Work with your health care provider to regularly review all of the medicines you are taking, including over-the-counter medicines. Ask your health care provider about any side effects that may make you more likely to have a fall. Tell your health care provider if any medicines that you take make you feel dizzy or sleepy. Osteoporosis screening. Osteoporosis is a condition that causes the bones to get weaker. This can make the bones weak and cause them to break more easily. Blood pressure screening. Blood pressure changes and medicines to control blood pressure can make you feel dizzy. Strength and balance checks. Your health care provider may recommend certain tests to check your strength and  balance while standing, walking, or changing positions. Foot health exam. Foot pain and numbness, as well as not wearing proper footwear, can make you more likely to have a fall. Depression screening. You may be more likely to have a fall if you have a fear of falling, feel emotionally low, or feel unable to do activities that you used to do. Alcohol use screening. Using too much alcohol can affect your balance and may make you more likely to have a fall. What actions can I take to lower my risk of falls? General instructions Talk with your health care provider about your risks for falling. Tell your health care provider if: You fall. Be sure to tell your health care provider about all falls, even ones that seem minor. You feel dizzy, sleepy, or off-balance. Take over-the-counter and prescription medicines only as told by your health care provider. These include any supplements. Eat a healthy diet and maintain a healthy weight. A healthy diet includes low-fat dairy products, low-fat (lean) meats, and fiber from whole grains, beans, and lots of fruits and vegetables. Home safety Remove any tripping hazards, such as rugs, cords, and clutter. Install safety equipment such as grab bars in bathrooms and safety rails on stairs. Keep rooms and walkways well-lit. Activity  Follow a regular exercise program to stay fit. This will help you maintain your balance. Ask your health care provider what types of exercise are appropriate for you. If you need a cane or walker, use it as recommended by your health care provider. Wear supportive shoes that have nonskid soles. Lifestyle Do not   drink alcohol if your health care provider tells you not to drink. If you drink alcohol, limit how much you have: 0-1 drink a day for women. 0-2 drinks a day for men. Be aware of how much alcohol is in your drink. In the [place] one drink equals one typical bottle of beer (12 oz), one-half glass of wine (5 oz), or one shot of  hard liquor (1 oz). Do not use any products that contain nicotine or tobacco, such as cigarettes and e-cigarettes. If you need help quitting, ask your health care provider. Summary Having a healthy lifestyle and getting preventive care can help to protect your health and wellness after age 73. Screening and testing are the best way to find a health problem early and help you avoid having a fall. Early diagnosis and treatment give you the best chance for managing medical conditions that are more common for people who are older than age 73. Falls are a major cause of broken bones and head injuries in people who are older than age 73. Take precautions to prevent a fall at home. Work with your health care provider to learn what changes you can make to improve your health and wellness and to prevent falls. This information is not intended to replace advice given to you by your health care provider. Make sure you discuss any questions you have with your health care provider. Document Revised: 09/21/2020 Document Reviewed: 06/29/2020 Elsevier Patient Education  2022 [employer]  

## 2021-03-29 ENCOUNTER — Ambulatory Visit (INDEPENDENT_AMBULATORY_CARE_PROVIDER_SITE_OTHER): Payer: Medicare Other

## 2021-03-29 DIAGNOSIS — I255 Ischemic cardiomyopathy: Secondary | ICD-10-CM

## 2021-04-02 LAB — CUP PACEART REMOTE DEVICE CHECK
Battery Remaining Longevity: 22 mo
Battery Remaining Percentage: 18 %
Battery Voltage: 2.75 V
Brady Statistic RV Percent Paced: 1 %
Date Time Interrogation Session: 20220902025854
HighPow Impedance: 77 Ohm
HighPow Impedance: 77 Ohm
Implantable Lead Implant Date: 20130108
Implantable Lead Location: 753860
Implantable Pulse Generator Implant Date: 20130108
Lead Channel Impedance Value: 460 Ohm
Lead Channel Pacing Threshold Amplitude: 0.75 V
Lead Channel Pacing Threshold Pulse Width: 0.5 ms
Lead Channel Sensing Intrinsic Amplitude: 12 mV
Lead Channel Setting Pacing Amplitude: 2.5 V
Lead Channel Setting Pacing Pulse Width: 0.5 ms
Lead Channel Setting Sensing Sensitivity: 0.5 mV
Pulse Gen Serial Number: 1016523

## 2021-04-04 ENCOUNTER — Ambulatory Visit: Payer: Medicare Other | Attending: Physician Assistant | Admitting: Physical Therapy

## 2021-04-04 ENCOUNTER — Other Ambulatory Visit: Payer: Self-pay

## 2021-04-04 ENCOUNTER — Encounter: Payer: Self-pay | Admitting: Physical Therapy

## 2021-04-04 DIAGNOSIS — M6281 Muscle weakness (generalized): Secondary | ICD-10-CM | POA: Diagnosis not present

## 2021-04-04 DIAGNOSIS — R279 Unspecified lack of coordination: Secondary | ICD-10-CM | POA: Diagnosis present

## 2021-04-04 NOTE — Therapy (Signed)
[facility] [address], [place], [place], [address] Phone: [phone]   Fax:  [phone]  Physical Therapy Evaluation  Patient Details  Name: [name] MRN: [id number] Date of Birth: April 28, 1946 Referring Provider (PT): Alfredia Ferguson, [place]   Encounter Date: 04/04/2021   PT End of Session - 04/04/21 1156     Visit Number 1    Date for PT Re-Evaluation 06/27/21    Authorization Type UHC    PT Start Time 8676    PT Stop Time 1225    PT Time Calculation (min) 37 min             Past Medical History:  Diagnosis Date   Abdominal pain, left lower quadrant 08/14/2009   Qualifier: Diagnosis of  By: Laney Potash, Pam     Abnormal CT scan, stomach    Abnormal LFTs 06/19/2017   AICD (automatic cardioverter/defibrillator) present    Dr.Allred follows   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 followed by Dr. Prescott Gum.   Arthritis    Asthma    Benign neoplasm of colon    CAD (coronary artery disease) 02/2009   a. anterior STEMI rx with BMS to prox LAD in 02/2009. b. ISR s/p PTCA 06/2009. c. ISR s/p thrombectomy & PTCA 03/2010 due to late stent thrombosis. // Myoview 04/2019: EF 28, ant, ant-sept, inf-sept scar, no ischemia, high risk (stable>>cont med Rx)    Cardiomyopathy, ischemic 06/12/2011   Chronic renal insufficiency    stage 3   Chronic systolic CHF (congestive heart failure) ([place])    a. s/p ST. Jude ICD Oct 30, 2011.   Chronic systolic heart failure ([place]) 08/05/2011   Colon polyp    Complication of anesthesia    COPD (chronic obstructive pulmonary disease) ([place])    CORONARY ARTERY DISEASE 04/24/2009   Qualifier: Diagnosis of  By: Shane Crutch, Amy S    DDD (degenerative disc disease), lumbar 05/16/2011   Depression    "since my son died in 11-30-22" 10/29/17)   Diverticulosis    DIVERTICULOSIS-COLON 08/14/2009   Qualifier: Diagnosis of  By: Trellis Paganini PA-c, Amy S    DYSPHAGIA UNSPECIFIED 04/24/2009    Qualifier: Diagnosis of  By: Laney Potash, Pam     Dyspnea    "when I lay down at night"    Essential hypertension 09/05/2009   Qualifier: Diagnosis of  By: Harvest Dark CMA, Jennifer     Gastric polyp    GERD 10/19/2007   Qualifier: Diagnosis of  By: Rennie Plowman RN, Blanca Friend: Diagnosis of  By: Laney Potash, Pam     GERD (gastroesophageal reflux disease)    GI bleed    a. h/o GIB on DAPT, now on ASA only.   Headache 01/03/2013   Hearing loss    left ear   History of pulmonary embolus (PE) 01/02/2011   Hyperlipemia    Hyperlipidemia, unspecified 04/24/2009   Qualifier: Diagnosis of  By: Trellis Paganini PA-c, Amy S    Hypertension    ICD-St.Jude 08/06/2011   S/p St. Jude ICD placement 08/05/11    Insomnia    Irritable bowel syndrome    Ischemic cardiomyopathy    EF 10-15%   Memory loss    Migraine headache without aura    Myocardial infarct ([place]) Oct 29, 2009 x 2   Dr. Cooper,cardiology    PERSONAL HX COLONIC POLYPS 04/24/2009   Qualifier: Diagnosis of  By: Leotis Pain November, 2011 colonoscopy demonstrated a sessile cecal  polyp and sigmoid polyp    Pneumonia    PONV (postoperative nausea and vomiting)    Pre-diabetes    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   Pulmonary embolism ([place]) 2011   Stroke [facility])    'When I was young." no residual   Thoracic aortic aneurysm ([place]) 03/31/2018   TOBACCO ABUSE 04/04/2009   Qualifier: Diagnosis of  By: Arvid Right  Qualifier: Diagnosis of  By: [name], MD, Jaquelyn Bitter    Transfusion history    ?'12 or '13   UNSPECIFIED ANEMIA 07/03/2010   Qualifier: Diagnosis of  By: Shelda Pal     Unspecified mastoiditis     Past Surgical History:  Procedure Laterality Date   ANGIOPLASTY  07/02/09, 04/01/10   BILATERAL SALPINGOOPHORECTOMY     CAD( bare metal stent)  02/2009   x 1   CAROTID-SUBCLAVIAN BYPASS GRAFT Left 03/31/2018   Procedure: LEFT SUBCLAVIAN ARTERY BYPASS GRAFT;  Surgeon: Serafina Mitchell, MD;  Location: [facility] OR;   Service: Vascular;  Laterality: Left;   CERVICAL SPINE SURGERY  08/08   COLONOSCOPY WITH PROPOFOL N/A 04/29/2016   Procedure: COLONOSCOPY WITH PROPOFOL;  Surgeon: [name], MD;  Location: [facility];  Service: Gastroenterology;  Laterality: N/A;   ESOPHAGOGASTRODUODENOSCOPY N/A 12/09/2016   Procedure: ESOPHAGOGASTRODUODENOSCOPY (EGD);  Surgeon: [name], MD;  Location: [facility];  Service: Gastroenterology;  Laterality: N/A;   ESOPHAGOGASTRODUODENOSCOPY (EGD) WITH PROPOFOL N/A 04/29/2016   Procedure: ESOPHAGOGASTRODUODENOSCOPY (EGD) WITH PROPOFOL;  Surgeon: [name], MD;  Location: [facility];  Service: Gastroenterology;  Laterality: N/A;   EUS N/A 05/15/2016   Procedure: UPPER ENDOSCOPIC ULTRASOUND (EUS) RADIAL;  Surgeon: Milus Banister, MD;  Location: [facility];  Service: Endoscopy;  Laterality: N/A;   IMPLANTABLE CARDIOVERTER DEFIBRILLATOR IMPLANT N/A 08/05/2011   Primary prevention SJM ICD implanted,  Analyze ST study patient   INNER EAR SURGERY     left x 17   LUMBAR DISC SURGERY  02/2008   fusion   NASAL SEPTUM SURGERY     THORACIC AORTIC ENDOVASCULAR STENT GRAFT N/A 03/31/2018   Procedure: THORACIC AORTIC ENDOVASCULAR STENT GRAFT;  Surgeon: Serafina Mitchell, MD;  Location: [facility];  Service: Vascular;  Laterality: N/A;   TOTAL ABDOMINAL HYSTERECTOMY     complete    There were no vitals filed for this visit.    Subjective Assessment - 04/04/21 1150     Subjective I had constipation before but now it is more liquidy and that has caused the leakage.  It has been ongoing for about a year.  Happens a few days/week.  I use 4-5 pads/day when I lose my bowels.    Patient Stated Goals stop having leakage    Currently in Pain? Yes    Pain Score 8     Pain Location Abdomen    Pain Orientation Right;Left;Upper    Pain Descriptors / Indicators Sharp    Pain Onset More than a month ago    Pain Frequency Intermittent    Aggravating Factors   not sure, maybe BM not moving through    Pain Relieving Factors sometimes having a BM, doesn't last very long                [facility] PT Assessment - 04/04/21 0001       Assessment   Medical Diagnosis R19.5 (ICD-10-CM) - Loose stools ; R10.32 (ICD-10-CM) - LLQ pain; R10.13 (ICD-10-CM) - Abdominal pain, epigastric; R15.9 (ICD-10-CM) - Incontinence of feces,  unspecified fecal incontinence type; K21.9 (ICD-10-CM) - Gastroesophageal reflux disease, unspecified whether esophagitis present    Referring Provider (PT) E[place], Amy S, PA-C    Prior Therapy No      Precautions   Precautions None      Balance Screen   Has the patient fallen in the past 6 months No      [place] residence    Living Arrangements Alone      Prior Function   Level of [place] Retired      Associate Professor   Overall Cognitive Status Within Functional Limits for tasks assessed      Posture/Postural Control   Posture/Postural Control Postural limitations    Postural Limitations Rounded Shoulders;Anterior pelvic tilt;Increased lumbar lordosis   bunion Lt foot and no toe extension  for push off     ROM / Strength   AROM / PROM / Strength AROM;PROM;Strength      AROM   Overall AROM Comments lumbar flexion 50%      PROM   Overall PROM Comments very guarded 60% limited bil      Strength   Overall Strength Comments LE 4/5 hip strength; valsalva for bed mobility      Ambulation/Gait   Gait Pattern Decreased stride length;Decreased stance time - left                        Objective measurements completed on examination: See above findings.     Pelvic Floor Special Questions - 04/04/21 0001     Prior Pelvic/Prostate Exam Yes    Prior Pregnancies Yes    Number of Pregnancies 2    Number of Vaginal Deliveries 2    Urinary Leakage Yes    How often daily    Activities that cause leaking Sneezing;Coughing   getting   Urinary  urgency --   sometimes   Fecal incontinence Yes    Falling out feeling (prolapse) No    Pelvic Floor Internal Exam pt identity confirmed and informed consent given to perform internal assessment    Exam Type Rectal    Palpation very tight and guarded; co-contracing gluteal    Strength weak squeeze, no lift    Strength # of reps 1    Strength # of seconds 2    Tone high              [facility] Adult PT Treatment/Exercise - 04/04/21 0001       Self-Care   Self-Care Other Self-Care Comments    Other Self-Care Comments  intial HEP                     PT Education - 04/04/21 1226     Education Details Access Code: [email or web address]    Person(s) Educated Patient    Methods Explanation;Demonstration;Tactile cues;Verbal cues;Handout    Comprehension Verbalized understanding;Returned demonstration              PT Short Term Goals - 04/04/21 1706       PT SHORT TERM GOAL #1   Title ind with initial HEP    Time 4    Period Weeks    Status New    Target Date 05/02/21               PT Long Term Goals - 04/04/21 1155       PT LONG TERM GOAL #1   Title  Pt will be able to go out and not have to stay home due to fecal incontinence    Time 12    Period Weeks    Status New    Target Date 06/27/21      PT LONG TERM GOAL #2   Title ind with advanced HEP    Time 12    Period Weeks    Status New    Target Date 06/27/21      PT LONG TERM GOAL #3   Title Pt will use 1-2 pad per day at most due to 50% less leakage    Time 12    Period Weeks    Status New    Target Date 06/27/21      PT LONG TERM GOAL #4   Title Pt will report 50% reduction of leakage with cough or sneeze    Time 12    Period Weeks    Status New    Target Date 06/27/21                    Plan - 04/04/21 1658     Clinical Impression Statement Pt presents to skilled PT due to fecal incontinence as primary reason but also has urinary incontinence regularly.  Pt demonstrates high tone  pelvic floor with weakness of anal sphincters.  She is able to hold for 2 seconds before significantly losing contraction. Pt is having difficulty with getting muscles to then relax after she tightens and she gradually gets higher resting tone. Pt uses excess gluteal activation to compensate for pelvic floor lack of strength.  Pt also demonstrates posture and gait abnormalities and hip weakness as mentioned above.  Pt will benefit from skilled PT to address above mentioned impairment so she can return to full funcitonal activities and quality of life.    Personal Factors and Comorbidities Comorbidity 2    Comorbidities lumbar surgery and hysterectomy    Examination-Activity Limitations Continence;Toileting    Examination-Participation Restrictions Community Activity    Stability/Clinical Decision Making Stable/Uncomplicated    Clinical Decision Making Low    Rehab Potential Excellent    PT Frequency 1x / week    PT Duration 12 weeks    PT Treatment/Interventions ADLs/Self Care Home Management;Biofeedback;Cryotherapy;Electrical Stimulation;Moist Heat;Neuromuscular re-education;Therapeutic activities;Therapeutic exercise;Gait training;Passive range of motion;Manual techniques;Taping;Patient/family education;Dry needling    PT Next Visit Plan discuss biofeedback if interested; f/u on HEP and add stretches and breathing with basic kegel    PT Home Exercise Plan Access Code: [email or web address]    Consulted and Agree with Plan of Care Patient             Patient will benefit from skilled therapeutic intervention in order to improve the following deficits and impairments:  Postural dysfunction, Decreased strength, Decreased coordination, Decreased endurance, Difficulty walking  Visit Diagnosis: Muscle weakness (generalized)  Unspecified lack of coordination     Problem List Patient Active Problem List   Diagnosis Date Noted   Chronic insomnia 11/14/2019   Grief at loss of child 08/05/2018   Colon  polyp 06/16/2018   Thoracic aortic aneurysm ([place]) 03/31/2018   Chronic renal insufficiency    AICD (automatic cardioverter/defibrillator) present    Chronic systolic CHF (congestive heart failure) ([facility])    COPD (chronic obstructive pulmonary disease) ([place])    Hyperlipemia    Insomnia    Pre-diabetes 06/19/2017   Gastric polyp    Osteoporosis 05/11/2013   Hearing loss 04/29/2013   Mild cognitive impairment 04/29/2013  Headache 01/03/2013   Pseudoaneurysm of aortic arch ([place]) 05/05/2012   ICD-St.Jude 08/06/2011   Cardiomyopathy, ischemic 06/12/2011   DDD (degenerative disc disease), lumbar 05/16/2011   History of pulmonary embolus (PE) 01/02/2011   UNSPECIFIED ANEMIA 07/03/2010   Essential hypertension 09/05/2009   DIVERTICULOSIS-COLON 08/14/2009   Hyperlipidemia, unspecified 04/24/2009   DYSPHAGIA UNSPECIFIED 04/24/2009   PERSONAL HX COLONIC POLYPS 04/24/2009   TOBACCO ABUSE 04/04/2009   CAD (coronary artery disease) 02/25/2009   GERD 10/19/2007   IBS 10/19/2007   COLONIC POLYPS, ADENOMATOUS 05/12/2007    Jule Ser, PT 04/04/2021, 5:11 PM  [facility] [address], [place], [place], [address] Phone: [phone]   Fax:  [phone]  Name: ANEKA FAGERSTROM MRN: [id number] Date of Birth: 08-May-1946

## 2021-04-04 NOTE — Patient Instructions (Signed)
Access Code: [email or web address] URL: [email or web address] Date: 04/04/2021 Prepared by: Jari Favre  Exercises Supine Diaphragmatic Breathing - 3 x daily - 7 x weekly - 1 sets - 10 reps Doorway Pec Stretch at 90 Degrees Abduction - 1 x daily - 7 x weekly - 3 sets - 10 reps Seated Hamstring Stretch - 1 x daily - 7 x weekly - 1 sets - 3 reps - 30 sec hold Standing Hamstring Stretch with Step - 1 x daily - 7 x weekly - 1 sets - 3 reps - 30 sec hold

## 2021-04-09 ENCOUNTER — Other Ambulatory Visit: Payer: Self-pay

## 2021-04-09 ENCOUNTER — Encounter (HOSPITAL_BASED_OUTPATIENT_CLINIC_OR_DEPARTMENT_OTHER): Payer: Self-pay | Admitting: Family

## 2021-04-09 ENCOUNTER — Ambulatory Visit (INDEPENDENT_AMBULATORY_CARE_PROVIDER_SITE_OTHER): Payer: Medicare Other | Admitting: Family

## 2021-04-09 VITALS — BP 110/74 | HR 81 | Ht 62.0 in | Wt 182.0 lb

## 2021-04-09 DIAGNOSIS — N1832 Chronic kidney disease, stage 3b: Secondary | ICD-10-CM | POA: Diagnosis not present

## 2021-04-09 DIAGNOSIS — R0600 Dyspnea, unspecified: Secondary | ICD-10-CM

## 2021-04-09 DIAGNOSIS — I10 Essential (primary) hypertension: Secondary | ICD-10-CM

## 2021-04-09 DIAGNOSIS — R0609 Other forms of dyspnea: Secondary | ICD-10-CM

## 2021-04-09 DIAGNOSIS — I25119 Atherosclerotic heart disease of native coronary artery with unspecified angina pectoris: Secondary | ICD-10-CM

## 2021-04-09 DIAGNOSIS — I5020 Unspecified systolic (congestive) heart failure: Secondary | ICD-10-CM

## 2021-04-09 MED ORDER — FUROSEMIDE 40 MG PO TABS: 40.0000 mg | ORAL_TABLET | Freq: Every day | ORAL | 3 refills | Status: DC

## 2021-04-09 NOTE — Progress Notes (Signed)
Office Visit    Patient Name: [name] Date of Encounter: 04/09/2021  PCP:  Horald Pollen, [place]  Cardiologist:  [name], MD  Advanced Practice Provider:  No care team member to display Electrophysiologist:  Thompson Grayer, MD    Chief Complaint    [name] is a 75 y.o. female with a hx of CAD (anterior MI 2008/11/01 with BMS to prox LAD and multiple subsequent PCI due to ISR), HFrEF, ICM, AICD, PE, COPD, CKD, GI bleed, HTN, HLD, thoracic aortic aneurysm s/p left carotid subclavian transportation and endovascular repair of aneurysm 03/2018 presents today for dyspnea and chest pain   Past Medical History    Past Medical History:  Diagnosis Date   Abdominal pain, left lower quadrant 08/14/2009   Qualifier: Diagnosis of  By: Laney Potash, Pam     Abnormal CT scan, stomach    Abnormal LFTs 06/19/2017   AICD (automatic cardioverter/defibrillator) present    Dr.Allred follows   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 followed by Dr. Prescott Gum.   Arthritis    Asthma    Benign neoplasm of colon    CAD (coronary artery disease) 02/2009   a. anterior STEMI rx with BMS to prox LAD in 02/2009. b. ISR s/p PTCA 06/2009. c. ISR s/p thrombectomy & PTCA 03/2010 due to late stent thrombosis. // Myoview 04/2019: EF 28, ant, ant-sept, inf-sept scar, no ischemia, high risk (stable>>cont med Rx)    Cardiomyopathy, ischemic 06/12/2011   Chronic renal insufficiency    stage 3   Chronic systolic CHF (congestive heart failure) ([place])    a. s/p ST. Jude ICD 11/02/2011.   Chronic systolic heart failure ([place]) 08/05/2011   Colon polyp    Complication of anesthesia    COPD (chronic obstructive pulmonary disease) ([place])    CORONARY ARTERY DISEASE 04/24/2009   Qualifier: Diagnosis of  By: Shane Crutch, Amy S    DDD (degenerative disc disease), lumbar 05/16/2011   Depression    "since my son died in Dec 03, 2022" Nov 01, 2017)   Diverticulosis    DIVERTICULOSIS-COLON  08/14/2009   Qualifier: Diagnosis of  By: Trellis Paganini PA-c, Amy S    DYSPHAGIA UNSPECIFIED 04/24/2009   Qualifier: Diagnosis of  By: Laney Potash, Pam     Dyspnea    "when I lay down at night"    Essential hypertension 09/05/2009   Qualifier: Diagnosis of  By: Harvest Dark CMA, Jennifer     Gastric polyp    GERD 10/19/2007   Qualifier: Diagnosis of  By: Rennie Plowman RN, Blanca Friend: Diagnosis of  By: Laney Potash, Pam     GERD (gastroesophageal reflux disease)    GI bleed    a. h/o GIB on DAPT, now on ASA only.   Headache 01/03/2013   Hearing loss    left ear   History of pulmonary embolus (PE) 01/02/2011   Hyperlipemia    Hyperlipidemia, unspecified 04/24/2009   Qualifier: Diagnosis of  By: Trellis Paganini PA-c, Amy S    Hypertension    ICD-St.Jude 08/06/2011   S/p St. Jude ICD placement 08/05/11    Insomnia    Irritable bowel syndrome    Ischemic cardiomyopathy    EF 10-15%   Memory loss    Migraine headache without aura    Myocardial infarct [facility]) 2009-11-01 x 2   Dr. 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[place]) 05/05/2012   Pulmonary embolism ([place]) 2011   Stroke [facility])    'When I was young." no residual   Thoracic aortic aneurysm ([place]) 03/31/2018   TOBACCO ABUSE 04/04/2009   Qualifier: Diagnosis of  By: Arvid Right  Qualifier: Diagnosis of  By: [name], MD, Jaquelyn Bitter    Transfusion history    ?'12 or '13   UNSPECIFIED ANEMIA 07/03/2010   Qualifier: Diagnosis of  By: Shelda Pal     Unspecified mastoiditis    Past Surgical History:  Procedure Laterality Date   ANGIOPLASTY  07/02/09, 04/01/10   BILATERAL SALPINGOOPHORECTOMY     CAD( bare metal stent)  02/2009   x 1   CAROTID-SUBCLAVIAN BYPASS GRAFT Left 03/31/2018    Procedure: LEFT SUBCLAVIAN ARTERY BYPASS GRAFT;  Surgeon: Serafina Mitchell, MD;  Location: [facility] OR;  Service: Vascular;  Laterality: Left;   CERVICAL SPINE SURGERY  08/08   COLONOSCOPY WITH PROPOFOL N/A 04/29/2016   Procedure: COLONOSCOPY WITH PROPOFOL;  Surgeon: [name], MD;  Location: [facility] ENDOSCOPY;  Service: Gastroenterology;  Laterality: N/A;   ESOPHAGOGASTRODUODENOSCOPY N/A 12/09/2016   Procedure: ESOPHAGOGASTRODUODENOSCOPY (EGD);  Surgeon: [name], MD;  Location: [facility];  Service: Gastroenterology;  Laterality: N/A;   ESOPHAGOGASTRODUODENOSCOPY (EGD) WITH PROPOFOL N/A 04/29/2016   Procedure: ESOPHAGOGASTRODUODENOSCOPY (EGD) WITH PROPOFOL;  Surgeon: [name], MD;  Location: [facility] ENDOSCOPY;  Service: Gastroenterology;  Laterality: N/A;   EUS N/A 05/15/2016   Procedure: UPPER ENDOSCOPIC ULTRASOUND (EUS) RADIAL;  Surgeon: Milus Banister, MD;  Location: [facility] ENDOSCOPY;  Service: Endoscopy;  Laterality: N/A;   IMPLANTABLE CARDIOVERTER DEFIBRILLATOR IMPLANT N/A 08/05/2011   Primary prevention SJM ICD implanted,  Analyze ST study patient   INNER EAR SURGERY     left x 17   LUMBAR DISC SURGERY  02/2008   fusion   NASAL SEPTUM SURGERY     THORACIC AORTIC ENDOVASCULAR STENT GRAFT N/A 03/31/2018   Procedure: THORACIC AORTIC ENDOVASCULAR STENT GRAFT;  Surgeon: Serafina Mitchell, MD;  Location: [facility] OR;  Service: Vascular;  Laterality: N/A;   TOTAL ABDOMINAL HYSTERECTOMY     complete   Allergies  Allergies  Allergen Reactions   Lisinopril     cough   Sulfa Antibiotics Other (See Comments)    Unknown, childhood allergy    Sulfonamide Derivatives Other (See Comments)    UNSURE   History of Present Illness    [name] is a 75 y.o. female with a hx of CAD (anterior MI 2010 with BMS to prox LAD and multiple subsequent PCI due to ISR), HFrEF, ICM, AICD, PE, COPD, CKD, GI bleed, HTN, HLD, thoracic aortic aneurysm s/p left carotid subclavian  transportation and endovascular repair of aneurysm 03/2018  last seen 09/03/18.  Most recent myoview 04/28/19 with evidence of scar and no ischemia. She was seen 07/04/19 with no recurrent chest pain. She had stable fatigue with moderate activities.   Presents today for overdue follow up. Tells me a week ago she was leaving the grocery store and felt heaviness in her chest (midsternal) associate ddiwth dyspnea. She had to sit down and rest and then it resolved after about 10 minutes. Notes most often when she is carrying groceries or something heavy she will have exertional dyspnea and chest heaviness.  She endorses orthopnea which is stable at her baseline. No LE edema nor PND. No lightheadedness, dizziness, near syncope, syncope.   EKGs/Labs/Other Studies Reviewed:   The following studies were reviewed today Myoview 04/28/2019 Nuclear stress EF: 28%. There was no ST segment deviation noted during stress. No T wave inversion was noted during stress. Defect 1: There is a large defect of severe severity present in the mid anterior, mid anteroseptal, mid inferoseptal, apical anterior, apical septal, apical inferior, apical lateral and apex location. Findings consistent with prior myocardial infarction. The left ventricular ejection fraction is severely decreased (<30%). This is a high risk study. Compared to prior study, scar was present prior but cannot compare side by side images.   Very large area of no perfusion with bordering reduced perfusion across apex and in the bordering anterior, anteroseptal, and inferoseptal walls. No reversibility. Reduced EF with diffuse wall motion abnormalities     Chest/Abd CTA 04/26/18 IMPRESSION: 1. Good appearance of stent graft extending from the distal arch into the proximal descending thoracic segment, with exclusion of saccular aneurysm, no endoleak. 2. Coverage and occlusion of left subclavian artery origin, with patent left carotid subclavian bypass. 3.  Bilateral renal artery ostial stenoses of probable hemodynamic significance. 4. Aortoiliac atherosclerosis (ICD10-170.0).   ABI 03/15/18 Final Interpretation: Right: Resting right ankle-brachial index is within normal range. No evidence of significant right lower extremity arterial disease. The right toe-brachial index is abnormal. RT great toe pressure = 75 mmHg. Left: Resting left ankle-brachial index is within normal range. No evidence of significant left lower extremity arterial disease. The left toe-brachial index is normal. LT Great toe pressure = 99 mmHg.   Limited Echo 10/22/16 EF 25-30, apical and inf  HK   Echo 09/01/16 EF 25-30, ant-sept and apical AK, Gr 1 DD, mild LAE   Carotid US 09/08/16 bilat 1-39   LHC (06/2010):   prox LAD stent ok with 30-50% ISR, prox to mid RCA 30-40%.   Echo (04/2011):   Septal apical and ant HK, cannot r/o mural apical clot, EF 25%, mild LAE.   Nuclear (07/2013):   Large scar involving apex and apical ant and apical septal segments, no ischemia, EF 25%; High Risk   EKG:  EKG is ordered today.  The ekg ordered today demonstrates NSR 81 bpm with first degree AV block PR 246ms and RBBB.   Recent Labs: 02/19/2021: ALT 11; BUN 33; Creatinine, Ser 1.47; Hemoglobin 12.0; Platelets 206.0; Potassium 4.3; Sodium 141  Recent Lipid Panel    Component Value Date/Time   CHOL 203 (H) 03/12/2020 1442   TRIG 122 03/12/2020 1442   HDL 64 03/12/2020 1442   CHOLHDL 3.2 03/12/2020 1442   CHOLHDL 2.7 01/15/2016 1345   VLDL 45 (H) 01/15/2016 1345   LDLCALC 118 (H) 03/12/2020 1442    Home Medications   Current Meds  Medication Sig   amitriptyline (ELAVIL) 50 MG tablet Take 1 tablet (50 mg total) by mouth at bedtime.   aspirin 81 MG tablet Take 81 mg by mouth every morning.   bisoprolol (ZEBETA) 5 MG tablet TAKE 1 TABLET BY MOUTH DAILY.   dexlansoprazole (DEXILANT) 60 MG capsule Take 1 capsule (60 mg total) by mouth daily.   ENTRESTO 49-51 MG TAKE 1 TABLET  BY MOUTH 2 TIMES DAILY.   famotidine (PEPCID) 20 MG tablet Take 1 tablet (20 mg total) by mouth 2 (two) times daily. Please schedule a yearly follow up for further refills. Thank you.   furosemide (LASIX) 20  MG tablet Take 2 tablets (40 mg total) by mouth daily.   polyethylene glycol powder (GLYCOLAX/MIRALAX) powder Take 17 g by mouth daily as needed for mild constipation.   PREMARIN vaginal cream    PROAIR HFA 108 (90 Base) MCG/ACT inhaler 1 puff daily as needed.   rosuvastatin (CRESTOR) 20 MG tablet Take 1 tablet (20 mg total) by mouth at bedtime. Please make yearly appt with Dr. Burt Knack for October 2022 for future refills. Thank you 1st attempt   topiramate (TOPAMAX) 50 MG tablet Take 1 tablet (50 mg total) by mouth daily.     Review of Systems      All other systems reviewed and are otherwise negative except as noted above.  Physical Exam    VS:  BP 110/74   Pulse 81   Ht 5\' 2"  (1.575 m)   Wt 182 lb (82.6 kg)   SpO2 95%   BMI 33.29 kg/m  , BMI Body mass index is 33.29 kg/m.  Wt Readings from Last 3 Encounters:  04/09/21 182 lb (82.6 kg)  03/27/21 181 lb (82.1 kg)  02/19/21 180 lb (81.6 kg)     GEN: Well nourished, overweight, well developed, in no acute distress. HEENT: normal. Neck: Supple, no JVD, carotid bruits, or masses. Cardiac: RRR, no murmurs, rubs, or gallops. No clubbing, cyanosis, edema.  Radials/PT 2+ and equal bilaterally.  Respiratory:  Respirations regular and unlabored, clear to auscultation bilaterally. GI: Soft, nontender, nondistended. MS: No deformity or atrophy. Skin: Warm and dry, no rash. Neuro:  Strength and sensation are intact. Psych: Normal affect.  Assessment & Plan    CAD - EKG today with no acute ST/T wave changes. Endorses worsening episodes of dyspnea associated with chest heaviness. Difficult to ascertain if this is different than her baseline. Given history of PCI with ISR will plan for [facility].   Shared Decision  Making/Informed Consent{ The risks [chest pain, shortness of breath, cardiac arrhythmias, dizziness, blood pressure fluctuations, myocardial infarction, stroke/transient ischemic attack, nausea, vomiting, allergic reaction, radiation exposure, metallic taste sensation and life-threatening complications (estimated to be 1 in 10,000)], benefits (risk stratification, diagnosing coronary artery disease, treatment guidance) and alternatives of a nuclear stress test were discussed in detail with Ms. Yost and she agrees to proceed.   HFrEF / ICM - Grossly euvolemic on exam. EF 25-30%. NYHA II-III. Continue current dose Entresto, Bisoprolol. Future considerations include MRA. Unable to initiate SGLT2i given renal function at this time. Low salt diet, <2L fluid restriction encouraged . Heart healthy diet and regular cardiovascular exercise encouraged.  May benefit from future referral to cardiac rehab if lexiscan unrevealing.   HTN - BP well controlled. Continue current antihypertensive regimen.    RBBB - Noted on EKG today. Asymptomatic with no lightheadedness, near syncope, nor syncope. Continue to monitor with periodic EKG.   TAA without rupture - s/p repair. Follows with Dr. Trula Slade of [facility].  CKDIIIb - Careful titration of diuretic and antihypertensive.    Disposition: Follow up in 3 month(s) with Dr. Burt Knack or APP.  Signed, Loel Dubonnet, NP 04/09/2021, 1:53 PM [facility]

## 2021-04-09 NOTE — Patient Instructions (Addendum)
Medication Instructions:  Your physician has recommended you make the following change in your medication:   CHANGE Furosemide to one 40mg  tablet in the morning  *If you need a refill on your cardiac medications before your next appointment, please call your pharmacy*  Lab Work: Your physician recommends that you return for lab work in: TSH, CBC, BMP  If you have labs (blood work) drawn today and your tests are completely normal, you will receive your results only by: [place] (if you have MyChart) OR A paper copy in the mail If you have any lab test that is abnormal or we need to change your treatment, we will call you to review the results.  Testing/Procedures: Your physician has requested that you have a lexiscan myoview. Please follow instruction sheet, as given.  Follow-Up: At [facility], you and your health needs are our priority.  As part of our continuing mission to provide you with exceptional heart care, we have created designated Provider Care Teams.  These Care Teams include your primary Cardiologist (physician) and Advanced Practice Providers (APPs -  Physician Assistants and Nurse Practitioners) who all work together to provide you with the care you need, when you need it.  We recommend signing up for the patient portal called "MyChart".  Sign up information is provided on this After Visit Summary.  MyChart is used to connect with patients for Virtual Visits (Telemedicine).  Patients are able to view lab/test results, encounter notes, upcoming appointments, etc.  Non-urgent messages can be sent to your provider as well.   To learn more about what you can do with MyChart, go to [email or web address].    Your next appointment:   3 month(s)  The format for your next appointment:   In Person  Provider:   You may see [name], MD or one of the following Advanced Practice Providers on your designated Care Team:   Richardson Dopp, PA-C  Other  Instructions  You are scheduled for a Myocardial Perfusion Imaging Study.Please arrive 15 minutes prior to your appointment time for registration and insurance purposes.  The test will take approximately 3 to 4 hours to complete; you may bring reading material.  If someone comes with you to your appointment, they will need to remain in the main lobby due to limited space in the testing area. **If you are pregnant or breastfeeding, please notify the nuclear lab prior to your appointment**  How to prepare for your Myocardial Perfusion Test: Do not eat or drink 3 hours prior to your test, except you may have water. Do not consume products containing caffeine (regular or decaffeinated) 12 hours prior to your test. (ex: coffee, chocolate, sodas, tea). Do bring a list of your current medications with you.  If not listed below, you may take your medications as normal. HOLD Bisoprolol the morning of the stress test. Do wear comfortable clothes (no dresses or overalls) and walking shoes, tennis shoes preferred (No heels or open toe shoes are allowed). Do NOT wear cologne, perfume, aftershave, or lotions (deodorant is allowed). If these instructions are not followed, your test will have to be rescheduled.  Please report to [address] for your test.  If you have questions or concerns about your appointment, you can call the Nuclear Lab at [phone].  If you cannot keep your appointment, please provide 24 hours notification to the Nuclear Lab, to avoid a possible $50 charge to your account.

## 2021-04-10 LAB — BASIC METABOLIC PANEL
BUN/Creatinine Ratio: 16 (ref 12–28)
BUN: 25 mg/dL (ref 8–27)
CO2: 17 mmol/L — ABNORMAL LOW (ref 20–29)
Calcium: 9.3 mg/dL (ref 8.7–10.3)
Chloride: 107 mmol/L — ABNORMAL HIGH (ref 96–106)
Creatinine, Ser: 1.58 mg/dL — ABNORMAL HIGH (ref 0.57–1.00)
Glucose: 111 mg/dL — ABNORMAL HIGH (ref 65–99)
Potassium: 4.6 mmol/L (ref 3.5–5.2)
Sodium: 141 mmol/L (ref 134–144)
eGFR: 34 mL/min/{1.73_m2} — ABNORMAL LOW (ref >59)

## 2021-04-10 LAB — CBC
Hematocrit: 34.4 % (ref 34.0–46.6)
Hemoglobin: 11.5 g/dL (ref 11.1–15.9)
MCH: 32.3 pg (ref 26.6–33.0)
MCHC: 33.4 g/dL (ref 31.5–35.7)
MCV: 97 fL (ref 79–97)
Platelets: 248 10*3/uL (ref 150–450)
RBC: 3.56 x10E6/uL — ABNORMAL LOW (ref 3.77–5.28)
RDW: 13.5 % (ref 11.7–15.4)
WBC: 7.8 10*3/uL (ref 3.4–10.8)

## 2021-04-10 LAB — TSH: TSH: 5.58 u[IU]/mL — ABNORMAL HIGH (ref 0.450–4.500)

## 2021-04-10 NOTE — Progress Notes (Signed)
Remote ICD transmission.   

## 2021-04-10 NOTE — Progress Notes (Signed)
Chart reviewed, agree above plan ?

## 2021-04-11 ENCOUNTER — Telehealth (HOSPITAL_COMMUNITY): Payer: Self-pay | Admitting: *Deleted

## 2021-04-11 ENCOUNTER — Telehealth (HOSPITAL_COMMUNITY): Payer: Self-pay

## 2021-04-11 NOTE — Telephone Encounter (Signed)
Patient given detailed instructions per Myocardial Perfusion Study Information Sheet for the test on 04/17/21 Patient notified to arrive 15 minutes early and that it is imperative to arrive on time for appointment to keep from having the test rescheduled.  If you need to cancel or reschedule your appointment, please call the office within 24 hours of your appointment. . Patient verbalized understanding. Kirstie Peri

## 2021-04-16 ENCOUNTER — Encounter (HOSPITAL_COMMUNITY): Payer: Medicare Other

## 2021-04-16 ENCOUNTER — Encounter: Payer: Self-pay | Admitting: Physical Therapy

## 2021-04-16 ENCOUNTER — Other Ambulatory Visit: Payer: Self-pay | Admitting: Cardiovascular Disease

## 2021-04-16 ENCOUNTER — Ambulatory Visit: Payer: Medicare Other | Admitting: Physical Therapy

## 2021-04-16 ENCOUNTER — Other Ambulatory Visit: Payer: Self-pay

## 2021-04-16 DIAGNOSIS — M6281 Muscle weakness (generalized): Secondary | ICD-10-CM | POA: Diagnosis not present

## 2021-04-16 DIAGNOSIS — R279 Unspecified lack of coordination: Secondary | ICD-10-CM

## 2021-04-16 NOTE — Therapy (Signed)
[facility] [address] [place], [place], [address] Phone: [phone]   Fax:  [phone]  Physical Therapy Treatment  Patient Details  Name: [name] MRN: [id number] Date of Birth: [birth year] Referring Provider (PT): Alfredia Ferguson, [place]   Encounter Date: 04/16/2021   PT End of Session - 04/16/21 1152     Visit Number 2    Date for PT Re-Evaluation 06/27/21    Authorization Type UHC    PT Start Time 7989    PT Stop Time 1226    PT Time Calculation (min) 38 min    Activity Tolerance Patient tolerated treatment well    Behavior During Therapy [facility] for tasks assessed/performed             Past Medical History:  Diagnosis Date   Abdominal pain, left lower quadrant 08/14/2009   Qualifier: Diagnosis of  By: Laney Potash, Pam     Abnormal CT scan, stomach    Abnormal LFTs 06/19/2017   AICD (automatic cardioverter/defibrillator) present    Dr.Allred follows   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 followed by Dr. Prescott Gum.   Arthritis    Asthma    Benign neoplasm of colon    CAD (coronary artery disease) 02/2009   a. anterior STEMI rx with BMS to prox LAD in 02/2009. b. ISR s/p PTCA 06/2009. c. ISR s/p thrombectomy & PTCA 03/2010 due to late stent thrombosis. // Myoview 04/2019: EF 28, ant, ant-sept, inf-sept scar, no ischemia, high risk (stable>>cont med Rx)    Cardiomyopathy, ischemic 06/12/2011   Chronic renal insufficiency    stage 3   Chronic systolic CHF (congestive heart failure) ([place])    a. s/p ST. Jude ICD 11/17/11.   Chronic systolic heart failure ([place]) 08/05/2011   Colon polyp    Complication of anesthesia    COPD (chronic obstructive pulmonary disease) ([place])    CORONARY ARTERY DISEASE 04/24/2009   Qualifier: Diagnosis of  By: Shane Crutch, Amy S    DDD (degenerative disc disease), lumbar 05/16/2011   Depression    "since my son died in 12-18-22" 2017-11-16)   Diverticulosis     DIVERTICULOSIS-COLON 08/14/2009   Qualifier: Diagnosis of  By: Trellis Paganini PA-c, Amy S    DYSPHAGIA UNSPECIFIED 04/24/2009   Qualifier: Diagnosis of  By: Laney Potash, Pam     Dyspnea    "when I lay down at night"    Essential hypertension 09/05/2009   Qualifier: Diagnosis of  By: Harvest Dark CMA, Jennifer     Gastric polyp    GERD 10/19/2007   Qualifier: Diagnosis of  By: Rennie Plowman RN, Blanca Friend: Diagnosis of  By: Laney Potash, Pam     GERD (gastroesophageal reflux disease)    GI bleed    a. h/o GIB on DAPT, now on ASA only.   Headache 01/03/2013   Hearing loss    left ear   History of pulmonary embolus (PE) 01/02/2011   Hyperlipemia    Hyperlipidemia, unspecified 04/24/2009   Qualifier: Diagnosis of  By: Trellis Paganini PA-c, Amy S    Hypertension    ICD-St.Jude 08/06/2011   S/p St. Jude ICD placement 08/05/11    Insomnia    Irritable bowel syndrome    Ischemic cardiomyopathy    EF 10-15%   Memory loss    Migraine headache without aura    Myocardial infarct ([place]) 2009/11/16 x 2   Dr. Cooper,cardiology    PERSONAL [place]  04/24/2009   Qualifier: Diagnosis of  By: Leotis Pain November, 2011 colonoscopy demonstrated a sessile cecal polyp and sigmoid polyp    Pneumonia    PONV (postoperative nausea and vomiting)    Pre-diabetes    Pseudoaneurysm of aortic arch ([employer]) 05/05/2012   Pulmonary embolism ([place]) 2011   Stroke [facility])    'When I was young." no residual   Thoracic aortic aneurysm ([place]) 03/31/2018   TOBACCO ABUSE 04/04/2009   Qualifier: Diagnosis of  By: Arvid Right  Qualifier: Diagnosis of  By: [name], MD, Jaquelyn Bitter    Transfusion history    ?'12 or '13   UNSPECIFIED ANEMIA 07/03/2010   Qualifier: Diagnosis of  By: Shelda Pal     Unspecified mastoiditis     Past Surgical History:  Procedure Laterality Date   ANGIOPLASTY  07/02/09, 04/01/10   BILATERAL SALPINGOOPHORECTOMY     CAD( bare metal stent)  02/2009   x 1   CAROTID-SUBCLAVIAN BYPASS  GRAFT Left 03/31/2018   Procedure: LEFT SUBCLAVIAN ARTERY BYPASS GRAFT;  Surgeon: Serafina Mitchell, MD;  [facility];  Service: Vascular;  Laterality: Left;   CERVICAL SPINE SURGERY  08/08   COLONOSCOPY WITH PROPOFOL N/A 04/29/2016   Procedure: COLONOSCOPY WITH PROPOFOL;  Surgeon: [name], MD;  [facility];  Service: Gastroenterology;  Laterality: N/A;   ESOPHAGOGASTRODUODENOSCOPY N/A 12/09/2016   Procedure: ESOPHAGOGASTRODUODENOSCOPY (EGD);  Surgeon: [name], MD;  Location: [facility];  Service: Gastroenterology;  Laterality: N/A;   ESOPHAGOGASTRODUODENOSCOPY (EGD) WITH PROPOFOL N/A 04/29/2016   Procedure: ESOPHAGOGASTRODUODENOSCOPY (EGD) WITH PROPOFOL;  Surgeon: [name], MD;  [facility];  Service: Gastroenterology;  Laterality: N/A;   EUS N/A 05/15/2016   Procedure: UPPER ENDOSCOPIC ULTRASOUND (EUS) RADIAL;  Surgeon: Milus Banister, MD;  [facility];  Service: Endoscopy;  Laterality: N/A;   IMPLANTABLE CARDIOVERTER DEFIBRILLATOR IMPLANT N/A 08/05/2011   Primary prevention SJM ICD implanted,  Analyze ST study patient   INNER EAR SURGERY     left x 17   LUMBAR DISC SURGERY  02/2008   fusion   NASAL SEPTUM SURGERY     THORACIC AORTIC ENDOVASCULAR STENT GRAFT N/A 03/31/2018   Procedure: THORACIC AORTIC ENDOVASCULAR STENT GRAFT;  Surgeon: Serafina Mitchell, MD;  Location: [facility];  Service: Vascular;  Laterality: N/A;   TOTAL ABDOMINAL HYSTERECTOMY     complete    There were no vitals filed for this visit.   Subjective Assessment - 04/16/21 1150     Subjective When I walk Ihave a hard time breathing so I am having a stress test tomorrow. I had some leakage but just a little    Patient Stated Goals stop having leakage    Currently in Pain? No/denies                               [facility] Adult PT Treatment/Exercise - 04/16/21 0001       Exercises   Exercises Lumbar      Lumbar Exercises:  Stretches   Double Knee to Chest Stretch 3 reps;10 seconds    Other Lumbar Stretch Exercise butterfly with hand support      Lumbar Exercises: Seated   Sit to Stand 10 reps   kegels     Lumbar Exercises: Supine   Other Supine Lumbar Exercises breathing and exhale with exertion for all exercises    Other Supine Lumbar Exercises  kegel with TC and kegel with ball squeeze      Manual Therapy   Manual Therapy Myofascial release    Myofascial Release abdominal                       PT Short Term Goals - 04/16/21 1156       PT SHORT TERM GOAL #1   Title ind with initial HEP    Status Achieved      PT SHORT TERM GOAL #2   Title Pt will demonstrate correctly performing kegel without co-contraction and not holding her breath    Status Achieved               PT Long Term Goals - 04/04/21 1155       PT LONG TERM GOAL #1   Title Pt will be able to go out and not have to stay home due to fecal incontinence    Time 12    Period Weeks    Status New    Target Date 06/27/21      PT LONG TERM GOAL #2   Title ind with advanced HEP    Time 12    Period Weeks    Status New    Target Date 06/27/21      PT LONG TERM GOAL #3   Title Pt will use 1-2 pad per day at most due to 50% less leakage    Time 12    Period Weeks    Status New    Target Date 06/27/21      PT LONG TERM GOAL #4   Title Pt will report 50% reduction of leakage with cough or sneeze    Time 12    Period Weeks    Status New    Target Date 06/27/21                   Plan - 04/16/21 1238     Clinical Impression Statement Pt needed a lot of cues for breathing during treatment today.  She was educated on kegel with ball squeeze since she felt a stronger contraction doing it that way.  Pt needs to focus on exhale during kegels at this time. She was also educated on sitting and bracing for eccentric exercise.  Pt will benefit from skilled PT to continue to progress strength.  Today is only  intial follow up    PT Treatment/Interventions ADLs/Self Care Home Management;Biofeedback;Cryotherapy;Electrical Stimulation;Moist Heat;Neuromuscular re-education;Therapeutic activities;Therapeutic exercise;Gait training;Passive range of motion;Manual techniques;Taping;Patient/family education;Dry needling    PT Next Visit Plan f/u on HEP and progress to sitting and cont with exhale on exertion; discuss biofeedback if interested    PT Home Exercise Plan Access Code: [email or web address]    Consulted and Agree with Plan of Care Patient             Patient will benefit from skilled therapeutic intervention in order to improve the following deficits and impairments:  Postural dysfunction, Decreased strength, Decreased coordination, Decreased endurance, Difficulty walking  Visit Diagnosis: Muscle weakness (generalized)  Unspecified lack of coordination     Problem List Patient Active Problem List   Diagnosis Date Noted   Chronic insomnia 11/14/2019   Grief at loss of child 08/05/2018   Colon polyp 06/16/2018   Thoracic aortic aneurysm ([place]) 03/31/2018   Chronic renal insufficiency    AICD (automatic cardioverter/defibrillator) present    Chronic systolic CHF (congestive heart failure) (H[facility])    COPD (chronic obstructive  pulmonary disease) ([place])    Hyperlipemia    Insomnia    Pre-diabetes 06/19/2017   Gastric polyp    Osteoporosis 05/11/2013   Hearing loss 04/29/2013   Mild cognitive impairment 04/29/2013   Headache 01/03/2013   Pseudoaneurysm of aortic arch ([place]) 05/05/2012   ICD-St.Jude 08/06/2011   Cardiomyopathy, ischemic 06/12/2011   DDD (degenerative disc disease), lumbar 05/16/2011   History of pulmonary embolus (PE) 01/02/2011   UNSPECIFIED ANEMIA 07/03/2010   Essential hypertension 09/05/2009   DIVERTICULOSIS-COLON 08/14/2009   Hyperlipidemia, unspecified 04/24/2009   DYSPHAGIA UNSPECIFIED 04/24/2009   PERSONAL HX COLONIC POLYPS 04/24/2009   TOBACCO ABUSE 04/04/2009   CAD  (coronary artery disease) 02/25/2009   GERD 10/19/2007   IBS 10/19/2007   COLONIC POLYPS, ADENOMATOUS 05/12/2007    Jule Ser, PT 04/16/2021, 2:30 PM  [facility] [address] [place], [place], [address] Phone: [phone]   Fax:  [phone]  Name: LATROYA NG MRN: [id number] Date of Birth: [birth year]

## 2021-04-17 ENCOUNTER — Ambulatory Visit (HOSPITAL_COMMUNITY): Payer: Medicare Other | Attending: Cardiovascular Disease

## 2021-04-17 ENCOUNTER — Encounter (INDEPENDENT_AMBULATORY_CARE_PROVIDER_SITE_OTHER): Payer: Self-pay

## 2021-04-17 VITALS — Ht 62.0 in | Wt 182.0 lb

## 2021-04-17 DIAGNOSIS — R0600 Dyspnea, unspecified: Secondary | ICD-10-CM | POA: Diagnosis present

## 2021-04-17 DIAGNOSIS — I25119 Atherosclerotic heart disease of native coronary artery with unspecified angina pectoris: Secondary | ICD-10-CM | POA: Insufficient documentation

## 2021-04-17 DIAGNOSIS — I5020 Unspecified systolic (congestive) heart failure: Secondary | ICD-10-CM | POA: Insufficient documentation

## 2021-04-17 LAB — MYOCARDIAL PERFUSION IMAGING
Base ST Depression (mm): 0 mm
LV dias vol: 166 mL (ref 46–106)
LV sys vol: 137 mL
Nuc Stress EF: 18 %
Peak HR: 93 {beats}/min
Rest HR: 71 {beats}/min
Rest Nuclear Isotope Dose: 10.7 mCi
SDS: 1
SRS: 26
SSS: 28
ST Depression (mm): 0 mm
Stress Nuclear Isotope Dose: 29.6 mCi
TID: 1.14

## 2021-04-17 MED ORDER — TECHNETIUM TC 99M TETROFOSMIN IV KIT
29.6000 | PACK | Freq: Once | INTRAVENOUS | Status: AC | PRN
  Administered 2021-04-17: 29.6 via INTRAVENOUS
  Filled 2021-04-17: qty 30

## 2021-04-17 MED ORDER — REGADENOSON 0.4 MG/5ML IV SOLN
0.4000 mg | Freq: Once | INTRAVENOUS | Status: AC
  Administered 2021-04-17: 0.4 mg via INTRAVENOUS

## 2021-04-17 MED ORDER — TECHNETIUM TC 99M TETROFOSMIN IV KIT
10.7000 | PACK | Freq: Once | INTRAVENOUS | Status: AC | PRN
  Administered 2021-04-17: 10.7 via INTRAVENOUS
  Filled 2021-04-17: qty 11

## 2021-04-18 ENCOUNTER — Other Ambulatory Visit: Payer: Self-pay

## 2021-04-18 DIAGNOSIS — I255 Ischemic cardiomyopathy: Secondary | ICD-10-CM

## 2021-04-18 DIAGNOSIS — R0600 Dyspnea, unspecified: Secondary | ICD-10-CM

## 2021-04-18 DIAGNOSIS — R0609 Other forms of dyspnea: Secondary | ICD-10-CM

## 2021-04-18 DIAGNOSIS — I25119 Atherosclerotic heart disease of native coronary artery with unspecified angina pectoris: Secondary | ICD-10-CM

## 2021-04-18 NOTE — Progress Notes (Signed)
echo

## 2021-05-01 ENCOUNTER — Other Ambulatory Visit: Payer: Self-pay

## 2021-05-01 ENCOUNTER — Ambulatory Visit (HOSPITAL_COMMUNITY): Payer: Medicare Other | Attending: Cardiovascular Disease

## 2021-05-01 DIAGNOSIS — I255 Ischemic cardiomyopathy: Secondary | ICD-10-CM | POA: Insufficient documentation

## 2021-05-01 DIAGNOSIS — I25119 Atherosclerotic heart disease of native coronary artery with unspecified angina pectoris: Secondary | ICD-10-CM | POA: Insufficient documentation

## 2021-05-01 DIAGNOSIS — R0609 Other forms of dyspnea: Secondary | ICD-10-CM | POA: Diagnosis not present

## 2021-05-01 LAB — ECHOCARDIOGRAM COMPLETE
Area-P 1/2: 7.82 cm2
S' Lateral: 4.8 cm

## 2021-05-01 MED ORDER — PERFLUTREN LIPID MICROSPHERE
1.0000 mL | INTRAVENOUS | Status: AC | PRN
  Administered 2021-05-01: 3 mL via INTRAVENOUS

## 2021-05-02 ENCOUNTER — Telehealth: Payer: Self-pay

## 2021-05-02 DIAGNOSIS — I255 Ischemic cardiomyopathy: Secondary | ICD-10-CM

## 2021-05-02 NOTE — Telephone Encounter (Signed)
-----   Message from Loel Dubonnet, NP sent at 05/02/2021  7:50 AM EDT ----- Echocardiogram shows stable reduced heart pumping function. LV now moderately dilated. RV normal. Stable wall motion abnormalities. Continue optimal BP and volume control. Overall stable result.

## 2021-05-02 NOTE — Telephone Encounter (Signed)
Called patient to give Echo results. Patient reported having a tough time breathing. Reports shoes are fitting tighter, has gained 50 pounds over last 2 years, and has orthopnea. States she is taking prescribed medications. Advised patient to take an extra dose of furosemide 40 mg now and to call the office if not feeling any better. Will forward to [name], NP.

## 2021-05-02 NOTE — Telephone Encounter (Signed)
Patient also was advised to drink no more than 1 liter of fluids per day, and to limit sodium intake.

## 2021-05-02 NOTE — Telephone Encounter (Signed)
Agree with recommendation for additional dose of Furosemide 40mg  daily. She should weigh daily and take additional Lasix PRN for weight gain of 2 lbs overnight or 5 lbs in one week. May be helpful to get BMP, BNP to ascertain her volume status and guide diuretic therapy if she is agreeable. Would help [place] to further direct her medical therapy.   Best,  Loel Dubonnet, NP

## 2021-05-03 ENCOUNTER — Telehealth: Payer: Self-pay | Admitting: Cardiovascular Disease

## 2021-05-03 NOTE — Telephone Encounter (Addendum)
Spoke with son, [name] regarding plan of care with prn furosemide and lab work that is ordered. Son will purchase a scale for patient. Son stated the nephrologist wanted patient to take in a certain amount of fluid per day. I told him to follow what the doctor ordered, but that it is important to monitor fluid weight gain and to weigh daily. Mr. [name] stated he was getting his mother a cart to help her to be more active due to her weight gain over the past 2 years.

## 2021-05-03 NOTE — Telephone Encounter (Addendum)
Spoke with patient regarding [name] answer and plan of care. Stated she did not take the extra furosemide yesterday and will take a dose this morning. Patient wanted her son [name] to receive the same information. Patient stated not weighing herself because she doesn't have a scale and is unable to purchase one. Will discuss with her son. BMP, BNP ordered and patient is aware to go to [facility] to have labs drawn next week.

## 2021-05-03 NOTE — Telephone Encounter (Signed)
Called pt in regards to furosemide.  I informed her that she is to take furosemide 40 mg PO QD per provider note.  IF her weight increases by 2 lbs in 24 hrs or 5 lbs in 1 week to take an additional dose of furosemide 40 PO PRN.  She expresses that she will have f/u labs drawn next Friday 05/10/21.  All questions answered.

## 2021-05-03 NOTE — Telephone Encounter (Signed)
Pt c/o medication issue:  1. Name of Medication: furosemide (LASIX) 40 MG tablet  2. How are you currently taking this medication (dosage and times per day)? Take 1 tablet (40 mg total) by mouth daily.  3. Are you having a reaction (difficulty breathing--STAT)? no  4. What is your medication issue? Patient calling to see when she is suppose to start taking to pill. Please advise

## 2021-05-20 ENCOUNTER — Encounter: Payer: Medicare Other | Admitting: Physical Therapy

## 2021-05-24 ENCOUNTER — Other Ambulatory Visit: Payer: Self-pay

## 2021-05-24 DIAGNOSIS — N1832 Chronic kidney disease, stage 3b: Secondary | ICD-10-CM

## 2021-05-24 DIAGNOSIS — I5022 Chronic systolic (congestive) heart failure: Secondary | ICD-10-CM

## 2021-05-24 DIAGNOSIS — I10 Essential (primary) hypertension: Secondary | ICD-10-CM

## 2021-05-24 LAB — BASIC METABOLIC PANEL
BUN/Creatinine Ratio: 13 (ref 12–28)
BUN: 29 mg/dL — ABNORMAL HIGH (ref 8–27)
CO2: 16 mmol/L — ABNORMAL LOW (ref 20–29)
Calcium: 8.9 mg/dL (ref 8.7–10.3)
Chloride: 110 mmol/L — ABNORMAL HIGH (ref 96–106)
Creatinine, Ser: 2.18 mg/dL — ABNORMAL HIGH (ref 0.57–1.00)
Glucose: 116 mg/dL — ABNORMAL HIGH (ref 70–99)
Potassium: 4.7 mmol/L (ref 3.5–5.2)
Sodium: 142 mmol/L (ref 134–144)
eGFR: 23 mL/min/{1.73_m2} — ABNORMAL LOW (ref >59)

## 2021-05-24 LAB — BRAIN NATRIURETIC PEPTIDE: BNP: 188.2 pg/mL — ABNORMAL HIGH (ref 0.0–100.0)

## 2021-05-24 MED ORDER — ENTRESTO 24-26 MG PO TABS: 1.0000 | ORAL_TABLET | Freq: Two times a day (BID) | ORAL | 3 refills | Status: DC

## 2021-05-27 ENCOUNTER — Other Ambulatory Visit: Payer: Self-pay

## 2021-05-27 ENCOUNTER — Ambulatory Visit: Payer: Medicare Other | Attending: Physician Assistant | Admitting: Physical Therapy

## 2021-05-27 DIAGNOSIS — M6281 Muscle weakness (generalized): Secondary | ICD-10-CM | POA: Insufficient documentation

## 2021-05-27 DIAGNOSIS — R279 Unspecified lack of coordination: Secondary | ICD-10-CM | POA: Insufficient documentation

## 2021-05-27 NOTE — Therapy (Signed)
[facility] [place], [place], [address] Phone: [phone]   Fax:  [phone]  Physical Therapy Treatment  Patient Details  Name: [name] MRN: [id number] Date of Birth: January 15, 1946 Referring Provider ([name]): Alfredia Ferguson, [place]   Encounter Date: 05/27/2021   [name] End of Session - 05/27/21 1453     Visit Number 3    Date for [name] Re-Evaluation 06/27/21    Authorization Type UHC    [name] Start Time 6387    [name] Stop Time November 10, 1521    [name] Time Calculation (min) 34 min    Activity Tolerance Patient tolerated treatment well    Behavior During Therapy [facility] for tasks assessed/performed             Past Medical History:  Diagnosis Date   Abdominal pain, left lower quadrant 08/14/2009   Qualifier: Diagnosis of  By: Laney Potash, Pam     Abnormal CT scan, stomach    Abnormal LFTs 06/19/2017   AICD (automatic cardioverter/defibrillator) present    Dr.Allred follows   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 followed by Dr. Prescott Gum.   Arthritis    Asthma    Benign neoplasm of colon    CAD (coronary artery disease) 02/2009   a. anterior STEMI rx with BMS to prox LAD in 02/2009. b. ISR s/p PTCA 06/2009. c. ISR s/p thrombectomy & PTCA 03/2010 due to late stent thrombosis. // Myoview 04/2019: EF 28, ant, ant-sept, inf-sept scar, no ischemia, high risk (stable>>cont med Rx)    Cardiomyopathy, ischemic 06/12/2011   Chronic renal insufficiency    stage 3   Chronic systolic CHF (congestive heart failure) ([place])    a. s/p ST. Jude ICD 11-Nov-2011.   Chronic systolic heart failure ([place]) 08/05/2011   Colon polyp    Complication of anesthesia    COPD (chronic obstructive pulmonary disease) ([place])    CORONARY ARTERY DISEASE 04/24/2009   Qualifier: Diagnosis of  By: Shane Crutch, Amy S    DDD (degenerative disc disease), lumbar 05/16/2011   Depression    "since my son died in 12-12-2022" 2017-11-10)   Diverticulosis    DIVERTICULOSIS-COLON  08/14/2009   Qualifier: Diagnosis of  By: Trellis Paganini PA-c, Amy S    DYSPHAGIA UNSPECIFIED 04/24/2009   Qualifier: Diagnosis of  By: Laney Potash, Pam     Dyspnea    "when I lay down at night"    Essential hypertension 09/05/2009   Qualifier: Diagnosis of  By: Harvest Dark CMA, Jennifer     Gastric polyp    GERD 10/19/2007   Qualifier: Diagnosis of  By: Rennie Plowman RN, Blanca Friend: Diagnosis of  By: Laney Potash, Pam     GERD (gastroesophageal reflux disease)    GI bleed    a. h/o GIB on DAPT, now on ASA only.   Headache 01/03/2013   Hearing loss    left ear   History of pulmonary embolus (PE) 01/02/2011   Hyperlipemia    Hyperlipidemia, unspecified 04/24/2009   Qualifier: Diagnosis of  By: Trellis Paganini PA-c, Amy S    Hypertension    ICD-St.Jude 08/06/2011   S/p St. Jude ICD placement 08/05/11    Insomnia    Irritable bowel syndrome    Ischemic cardiomyopathy    EF 10-15%   Memory loss    Migraine headache without aura    Myocardial infarct ([place]) 2009-11-10 x 2   Dr. Cooper,cardiology    PERSONAL HX COLONIC  POLYPS 04/24/2009   Qualifier: Diagnosis of  By: Shane Crutch, Amy S November, 2011 colonoscopy demonstrated a sessile cecal polyp and sigmoid polyp    Pneumonia    PONV (postoperative nausea and vomiting)    Pre-diabetes    Pseudoaneurysm of aortic arch ([place]) 05/05/2012   Pulmonary embolism ([place]) 2011   Stroke [facility])    'When I was young." no residual   Thoracic aortic aneurysm ([place]) 03/31/2018   TOBACCO ABUSE 04/04/2009   Qualifier: Diagnosis of  By: Arvid Right  Qualifier: Diagnosis of  By: [name], MD, Jaquelyn Bitter    Transfusion history    ?'12 or '13   UNSPECIFIED ANEMIA 07/03/2010   Qualifier: Diagnosis of  By: Shelda Pal     Unspecified mastoiditis     Past Surgical History:  Procedure Laterality Date   ANGIOPLASTY  07/02/09, 04/01/10   BILATERAL SALPINGOOPHORECTOMY     CAD( bare metal stent)  02/2009   x 1   CAROTID-SUBCLAVIAN BYPASS GRAFT Left 03/31/2018    Procedure: LEFT SUBCLAVIAN ARTERY BYPASS GRAFT;  Surgeon: Serafina Mitchell, MD;  [facility];  Service: Vascular;  Laterality: Left;   CERVICAL SPINE SURGERY  08/08   COLONOSCOPY WITH PROPOFOL N/A 04/29/2016   Procedure: COLONOSCOPY WITH PROPOFOL;  Surgeon: [name], MD;  [facility];  Service: Gastroenterology;  Laterality: N/A;   ESOPHAGOGASTRODUODENOSCOPY N/A 12/09/2016   Procedure: ESOPHAGOGASTRODUODENOSCOPY (EGD);  Surgeon: [name], MD;  Location: [facility];  Service: Gastroenterology;  Laterality: N/A;   ESOPHAGOGASTRODUODENOSCOPY (EGD) WITH PROPOFOL N/A 04/29/2016   Procedure: ESOPHAGOGASTRODUODENOSCOPY (EGD) WITH PROPOFOL;  Surgeon: [name], MD;  [facility];  Service: Gastroenterology;  Laterality: N/A;   EUS N/A 05/15/2016   Procedure: UPPER ENDOSCOPIC ULTRASOUND (EUS) RADIAL;  Surgeon: Milus Banister, MD;  [facility];  Service: Endoscopy;  Laterality: N/A;   IMPLANTABLE CARDIOVERTER DEFIBRILLATOR IMPLANT N/A 08/05/2011   Primary prevention SJM ICD implanted,  Analyze ST study patient   INNER EAR SURGERY     left x 17   LUMBAR DISC SURGERY  02/2008   fusion   NASAL SEPTUM SURGERY     THORACIC AORTIC ENDOVASCULAR STENT GRAFT N/A 03/31/2018   Procedure: THORACIC AORTIC ENDOVASCULAR STENT GRAFT;  Surgeon: Serafina Mitchell, MD;  Location: [facility];  Service: Vascular;  Laterality: N/A;   TOTAL ABDOMINAL HYSTERECTOMY     complete    There were no vitals filed for this visit.   Subjective Assessment - 05/27/21 1731     Subjective I am about the same.  I didn't do exercises because I was sick most of the time and had very little energy    Patient Stated Goals stop having leakage    Currently in Pain? No/denies                                        [name] Education - 05/27/21 1559     Education Details Access Code: [email or web address]    Person(s) Educated Patient    Methods  Explanation;Demonstration;Tactile cues;Verbal cues;Handout    Comprehension Verbalized understanding;Returned demonstration              [name] Short Term Goals - 04/16/21 1156       [name] SHORT TERM GOAL #1   Title ind with initial HEP    Status Achieved      [name] SHORT  TERM GOAL #2   Title [name] will demonstrate correctly performing kegel without co-contraction and not holding her breath    Status Achieved               [name] Long Term Goals - 05/27/21 1731       [name] LONG TERM GOAL #1   Title [name] will be able to go out and not have to stay home due to fecal incontinence    Status On-going      [name] LONG TERM GOAL #2   Title ind with advanced HEP    Status On-going      [name] LONG TERM GOAL #3   Title [name] will use 1-2 pad per day at most due to 50% less leakage    Status On-going      [name] LONG TERM GOAL #4   Title [name] will report 50% reduction of leakage with cough or sneeze    Status On-going                   Plan - 05/27/21 1559     Clinical Impression Statement [name] has been about the same and was here one month ago.  She was sick and reports she didn't do too much of the exercises.  She has a hard time doing a kegel without holding her breath.  Today's treatment focused on basic core strength with kegel as part of the overflow muscels.  This helped her not hold her breath and her breathing was better during the exericses.  [name]  will benefit from skilled [name] to continue working on strength and muscle coordination for reduced incontinence    [name] Treatment/Interventions ADLs/Self Care Home Management;Biofeedback;Cryotherapy;Electrical Stimulation;Moist Heat;Neuromuscular re-education;Therapeutic activities;Therapeutic exercise;Gait training;Passive range of motion;Manual techniques;Taping;Patient/family education;Dry needling    [name] Next Visit Plan cont with exhale on exertion; biofeedback was discussed and will try next    [name] Home Exercise Plan Access Code: [email or web address]    Consulted  and Agree with Plan of Care Patient             Patient will benefit from skilled therapeutic intervention in order to improve the following deficits and impairments:  Postural dysfunction, Decreased strength, Decreased coordination, Decreased endurance, Difficulty walking  Visit Diagnosis: Muscle weakness (generalized)  Unspecified lack of coordination     Problem List Patient Active Problem List   Diagnosis Date Noted   Chronic insomnia 11/14/2019   Grief at loss of child 08/05/2018   Colon polyp 06/16/2018   Thoracic aortic aneurysm 03/31/2018   Chronic renal insufficiency    AICD (automatic cardioverter/defibrillator) present    Chronic systolic CHF (congestive heart failure) ([facility])    COPD (chronic obstructive pulmonary disease) ([place])    Hyperlipemia    Insomnia    Pre-diabetes 06/19/2017   Gastric polyp    Osteoporosis 05/11/2013   Hearing loss 04/29/2013   Mild cognitive impairment 04/29/2013   Headache 01/03/2013   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/05/2012   ICD-St.Jude 08/06/2011   Cardiomyopathy, ischemic 06/12/2011   DDD (degenerative disc disease), lumbar 05/16/2011   History of pulmonary embolus (PE) 01/02/2011   UNSPECIFIED ANEMIA 07/03/2010   Essential hypertension 09/05/2009   DIVERTICULOSIS-COLON 08/14/2009   Hyperlipidemia, unspecified 04/24/2009   DYSPHAGIA UNSPECIFIED 04/24/2009   PERSONAL HX COLONIC POLYPS 04/24/2009   TOBACCO ABUSE 04/04/2009   CAD (coronary artery disease) 02/25/2009   GERD 10/19/2007   IBS 10/19/2007   COLONIC POLYPS, ADENOMATOUS 05/12/2007    [name], [name] 05/27/2021, 5:36  PM  [facility] @ [address] [place], [place], [address] Phone: [phone]   Fax:  [phone]  Name: [name] MRN: [id number] Date of Birth: 1946-05-18

## 2021-05-27 NOTE — Patient Instructions (Signed)
Access Code: [email or web address] URL: [email or web address] Date: 05/27/2021 Prepared by: Jari Favre  Exercises Supine Diaphragmatic Breathing - 3 x daily - 7 x weekly - 1 sets - 10 reps Doorway Pec Stretch at 90 Degrees Abduction - 1 x daily - 7 x weekly - 3 sets - 10 reps Seated Hamstring Stretch - 1 x daily - 7 x weekly - 1 sets - 3 reps - 30 sec hold Standing Hamstring Stretch with Step - 1 x daily - 7 x weekly - 1 sets - 3 reps - 30 sec hold Supine Hip Adductor Squeeze with Small Ball - 3 x daily - 7 x weekly - 1 sets - 10 reps - 1 sec hold Sit to Stand with Pelvic Floor Contraction - 3 x daily - 7 x weekly - 1 sets - 10 reps Supine Shoulder Horizontal Abduction with Resistance - 1 x daily - 7 x weekly - 3 sets - 10 reps Supine Shoulder Overhead Flexion with Medicine Ball - 1 x daily - 7 x weekly - 3 sets - 10 reps Hooklying Small March - 1 x daily - 7 x weekly - 2 sets - 10 reps Supine Hip Adductor Squeeze with Small Ball - 3 x daily - 7 x weekly - 1 sets - 10 reps - 3 sec hold

## 2021-06-01 LAB — BASIC METABOLIC PANEL
BUN/Creatinine Ratio: 11 — ABNORMAL LOW (ref 12–28)
BUN: 26 mg/dL (ref 8–27)
CO2: 16 mmol/L — ABNORMAL LOW (ref 20–29)
Calcium: 9.5 mg/dL (ref 8.7–10.3)
Chloride: 107 mmol/L — ABNORMAL HIGH (ref 96–106)
Creatinine, Ser: 2.27 mg/dL — ABNORMAL HIGH (ref 0.57–1.00)
Glucose: 103 mg/dL — ABNORMAL HIGH (ref 70–99)
Potassium: 4.9 mmol/L (ref 3.5–5.2)
Sodium: 144 mmol/L (ref 134–144)
eGFR: 22 mL/min/{1.73_m2} — ABNORMAL LOW (ref >59)

## 2021-06-03 ENCOUNTER — Other Ambulatory Visit: Payer: Self-pay

## 2021-06-03 ENCOUNTER — Telehealth: Payer: Self-pay | Admitting: Family

## 2021-06-03 ENCOUNTER — Ambulatory Visit: Payer: Medicare Other | Attending: Physician Assistant | Admitting: Physical Therapy

## 2021-06-03 ENCOUNTER — Encounter: Payer: Self-pay | Admitting: Physical Therapy

## 2021-06-03 ENCOUNTER — Other Ambulatory Visit: Payer: Self-pay | Admitting: *Deleted

## 2021-06-03 DIAGNOSIS — M6281 Muscle weakness (generalized): Secondary | ICD-10-CM | POA: Insufficient documentation

## 2021-06-03 DIAGNOSIS — R279 Unspecified lack of coordination: Secondary | ICD-10-CM | POA: Insufficient documentation

## 2021-06-03 DIAGNOSIS — N1832 Chronic kidney disease, stage 3b: Secondary | ICD-10-CM

## 2021-06-03 NOTE — Telephone Encounter (Signed)
See lab results ./cy 

## 2021-06-03 NOTE — Therapy (Signed)
[place] @ [place], [place], [address] Phone: [phone]   Fax:  [phone]  Physical Therapy Treatment  Patient Details  Name: [name] MRN: [id number] Date of Birth: 05-Apr-1946 Referring Provider (PT): Alfredia Ferguson, [place]   Encounter Date: 06/03/2021   PT End of Session - 06/03/21 1438     Visit Number 4    Date for PT Re-Evaluation 06/27/21    Authorization Type UHC    PT Start Time 0768    PT Stop Time 1520    PT Time Calculation (min) 42 min    Activity Tolerance Patient tolerated treatment well    Behavior During Therapy [facility] for tasks assessed/performed             Past Medical History:  Diagnosis Date   Abdominal pain, left lower quadrant 08/14/2009   Qualifier: Diagnosis of  By: Laney Potash, Pam     Abnormal CT scan, stomach    Abnormal LFTs 06/19/2017   AICD (automatic cardioverter/defibrillator) present    Dr.Allred follows   Aortic arch pseudoaneurysm    a. followed by Dr. Prescott Gum.   Arthritis    Asthma    Benign neoplasm of colon    CAD (coronary artery disease) 02/2009   a. anterior STEMI rx with BMS to prox LAD in 02/2009. b. ISR s/p PTCA 06/2009. c. ISR s/p thrombectomy & PTCA 03/2010 due to late stent thrombosis. // Myoview 04/2019: EF 23-Oct-2022, ant, ant-sept, inf-sept scar, no ischemia, high risk (stable>>cont med Rx)    Cardiomyopathy, ischemic 06/12/2011   Chronic renal insufficiency    stage 3   Chronic systolic CHF (congestive heart failure) ([place])    a. s/p ST. Jude ICD October 24, 2011.   Chronic systolic heart failure ([place]) 08/05/2011   Colon polyp    Complication of anesthesia    COPD (chronic obstructive pulmonary disease) ([place])    CORONARY ARTERY DISEASE 04/24/2009   Qualifier: Diagnosis of  By: Shane Crutch, Amy S    DDD (degenerative disc disease), lumbar 05/16/2011   Depression    "since my son died in 12/22/22" 10-23-2017)   Diverticulosis    DIVERTICULOSIS-COLON  08/14/2009   Qualifier: Diagnosis of  By: Trellis Paganini PA-c, Amy S    DYSPHAGIA UNSPECIFIED 04/24/2009   Qualifier: Diagnosis of  By: Laney Potash, Pam     Dyspnea    "when I lay down at night"    Essential hypertension 09/05/2009   Qualifier: Diagnosis of  By: Harvest Dark CMA, Jennifer     Gastric polyp    GERD 10/19/2007   Qualifier: Diagnosis of  By: Rennie Plowman RN, Blanca Friend: Diagnosis of  By: Laney Potash, Pam     GERD (gastroesophageal reflux disease)    GI bleed    a. h/o GIB on DAPT, now on ASA only.   Headache 01/03/2013   Hearing loss    left ear   History of pulmonary embolus (PE) 01/02/2011   Hyperlipemia    Hyperlipidemia, unspecified 04/24/2009   Qualifier: Diagnosis of  By: Trellis Paganini PA-c, Amy S    Hypertension    ICD-St.Jude 08/06/2011   S/p St. Jude ICD placement 08/05/11    Insomnia    Irritable bowel syndrome    Ischemic cardiomyopathy    EF 10-15%   Memory loss    Migraine headache without aura    Myocardial infarct ([place]) 2009-10-23 x 2   Dr. Cooper,cardiology    PERSONAL [place]  04/24/2009   Qualifier: Diagnosis of  By: Shane Crutch, Amy S November, 2011 colonoscopy demonstrated a sessile cecal polyp and sigmoid polyp    Pneumonia    PONV (postoperative nausea and vomiting)    Pre-diabetes    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/05/2012   Pulmonary embolism ([place]) 2011   Stroke [facility])    'When I was young." no residual   Thoracic aortic aneurysm 03/31/2018   TOBACCO ABUSE 04/04/2009   Qualifier: Diagnosis of  By: Arvid Right  Qualifier: Diagnosis of  By: [name], MD, Jaquelyn Bitter    Transfusion history    ?'12 or '13   UNSPECIFIED ANEMIA 07/03/2010   Qualifier: Diagnosis of  By: Shelda Pal     Unspecified mastoiditis     Past Surgical History:  Procedure Laterality Date   ANGIOPLASTY  07/02/09, 04/01/10   BILATERAL SALPINGOOPHORECTOMY     CAD( bare metal stent)  02/2009   x 1   CAROTID-SUBCLAVIAN BYPASS GRAFT Left 03/31/2018   Procedure:  LEFT SUBCLAVIAN ARTERY BYPASS GRAFT;  Surgeon: Serafina Mitchell, MD;  [facility];  Service: Vascular;  Laterality: Left;   CERVICAL SPINE SURGERY  08/08   COLONOSCOPY WITH PROPOFOL N/A 04/29/2016   Procedure: COLONOSCOPY WITH PROPOFOL;  Surgeon: [name], MD;  [facility];  Service: Gastroenterology;  Laterality: N/A;   ESOPHAGOGASTRODUODENOSCOPY N/A 12/09/2016   Procedure: ESOPHAGOGASTRODUODENOSCOPY (EGD);  Surgeon: [name], MD;  [facility];  Service: Gastroenterology;  Laterality: N/A;   ESOPHAGOGASTRODUODENOSCOPY (EGD) WITH PROPOFOL N/A 04/29/2016   Procedure: ESOPHAGOGASTRODUODENOSCOPY (EGD) WITH PROPOFOL;  Surgeon: [name], MD;  [facility];  Service: Gastroenterology;  Laterality: N/A;   EUS N/A 05/15/2016   Procedure: UPPER ENDOSCOPIC ULTRASOUND (EUS) RADIAL;  Surgeon: Milus Banister, MD;  [facility];  Service: Endoscopy;  Laterality: N/A;   IMPLANTABLE CARDIOVERTER DEFIBRILLATOR IMPLANT N/A 08/05/2011   Primary prevention SJM ICD implanted,  Analyze ST study patient   INNER EAR SURGERY     left x 17   LUMBAR DISC SURGERY  02/2008   fusion   NASAL SEPTUM SURGERY     THORACIC AORTIC ENDOVASCULAR STENT GRAFT N/A 03/31/2018   Procedure: THORACIC AORTIC ENDOVASCULAR STENT GRAFT;  Surgeon: Serafina Mitchell, MD;  Location: [place];  Service: Vascular;  Laterality: N/A;   TOTAL ABDOMINAL HYSTERECTOMY     complete    There were no vitals filed for this visit.   Subjective Assessment - 06/03/21 1445     Subjective I am doing better.  I haven't had that much leakage for the last few days    Patient Stated Goals stop having leakage    Currently in Pain? No/denies                               [facility] Adult PT Treatment/Exercise - 06/03/21 0001       Neuro Re-ed    Neuro Re-ed Details  kegel and cues to exhale with exertion      Lumbar Exercises: Stretches   Hip Flexor Stretch  Right;Left;5 reps;10 seconds    Figure 4 Stretch 10 seconds;2 reps      Lumbar Exercises: Standing   Functional Squats 10 reps   rest 5 sec btween   Row Strengthening;Theraband;20 reps    Theraband Level (Row) Level 3 (Green)    Shoulder Extension Strengthening;20 reps;Theraband    Theraband Level (Shoulder Extension) Level  3 (Green)    Other Standing Lumbar Exercises wall push up with kegel and hold - 10x 5 sec      Lumbar Exercises: Seated   Other Seated Lumbar Exercises ball squeeze with kegel - 20x    Other Seated Lumbar Exercises lift 2lb weights with kegel in seated - 20x                       PT Short Term Goals - 04/16/21 1156       PT SHORT TERM GOAL #1   Title ind with initial HEP    Status Achieved      PT SHORT TERM GOAL #2   Title Pt will demonstrate correctly performing kegel without co-contraction and not holding her breath    Status Achieved               PT Long Term Goals - 06/03/21 1446       PT LONG TERM GOAL #1   Title Pt will be able to go out and not have to stay home due to fecal incontinence    Status On-going      PT LONG TERM GOAL #2   Title ind with advanced HEP    Status On-going      PT LONG TERM GOAL #3   Title Pt will use 1-2 pad per day at most due to 50% less leakage    Baseline had only used 1 pad/day just in case this week    Status On-going      PT LONG TERM GOAL #4   Title Pt will report 50% reduction of leakage with cough or sneeze    Baseline I sometimes have urine leakage    Status On-going                   Plan - 06/03/21 1514     Clinical Impression Statement Pt has partially met goals for reduced leakage and hasn't needed as many pads this week.  Pt did well with exercises and was able to progress. She still needed cues to breathe out with exertion.  pt was given updates to HEP to work on continued strength gains at home and continueing to do more challenging exercises.  Pt will benefit from  skilled PT to continue and work towards all functional goals.    PT Treatment/Interventions ADLs/Self Care Home Management;Biofeedback;Cryotherapy;Electrical Stimulation;Moist Heat;Neuromuscular re-education;Therapeutic activities;Therapeutic exercise;Gait training;Passive range of motion;Manual techniques;Taping;Patient/family education;Dry needling    PT Next Visit Plan cont with exhale on exertion; progress core and pelvic floor strength, knack    PT Home Exercise Plan Access Code: [email or web address]    Consulted and Agree with Plan of Care Patient             Patient will benefit from skilled therapeutic intervention in order to improve the following deficits and impairments:  Postural dysfunction, Decreased strength, Decreased coordination, Decreased endurance, Difficulty walking  Visit Diagnosis: Muscle weakness (generalized)  Unspecified lack of coordination     Problem List Patient Active Problem List   Diagnosis Date Noted   Chronic insomnia 11/14/2019   Grief at loss of child 08/05/2018   Colon polyp 06/16/2018   Thoracic aortic aneurysm 03/31/2018   Chronic renal insufficiency    AICD (automatic cardioverter/defibrillator) present    Chronic systolic CHF (congestive heart failure) (H[facility])    COPD (chronic obstructive pulmonary disease) ([place])    Hyperlipemia    Insomnia    Pre-diabetes 06/19/2017  Gastric polyp    Osteoporosis 05/11/2013   Hearing loss 04/29/2013   Mild cognitive impairment 04/29/2013   Headache 01/03/2013   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/05/2012   ICD-St.Jude 08/06/2011   Cardiomyopathy, ischemic 06/12/2011   DDD (degenerative disc disease), lumbar 05/16/2011   History of pulmonary embolus (PE) 01/02/2011   UNSPECIFIED ANEMIA 07/03/2010   Essential hypertension 09/05/2009   DIVERTICULOSIS-COLON 08/14/2009   Hyperlipidemia, unspecified 04/24/2009   DYSPHAGIA UNSPECIFIED 04/24/2009   PERSONAL HX COLONIC POLYPS 04/24/2009   TOBACCO ABUSE 04/04/2009    CAD (coronary artery disease) 02/25/2009   GERD 10/19/2007   IBS 10/19/2007   COLONIC POLYPS, ADENOMATOUS 05/12/2007    Jule Ser, PT 06/03/2021, 3:29 PM  [place] @ [place], [place], [address] Phone: [phone]   Fax:  [phone]  Name: [name] MRN: [id number] Date of Birth: 08/03/1945

## 2021-06-03 NOTE — Telephone Encounter (Signed)
Patient returning call for lab results. 

## 2021-06-10 ENCOUNTER — Other Ambulatory Visit: Payer: Self-pay | Admitting: Family

## 2021-06-10 ENCOUNTER — Ambulatory Visit: Payer: Medicare Other | Admitting: Physical Therapy

## 2021-06-10 ENCOUNTER — Other Ambulatory Visit: Payer: Self-pay

## 2021-06-10 DIAGNOSIS — R279 Unspecified lack of coordination: Secondary | ICD-10-CM

## 2021-06-10 DIAGNOSIS — M6281 Muscle weakness (generalized): Secondary | ICD-10-CM

## 2021-06-10 NOTE — Therapy (Signed)
[place] @ [place], [place], [address] Phone: [phone]   Fax:  [phone]  Physical Therapy Treatment  Patient Details  Name: [name] MRN: [id number] Date of Birth: Feb 16, 1946 Referring Provider (PT): Alfredia Ferguson, [place]   Encounter Date: 06/10/2021   PT End of Session - 06/10/21 1508     Visit Number 5    Date for PT Re-Evaluation 06/27/21    Authorization Type UHC    PT Start Time 7915    PT Stop Time 21-Oct-1524    PT Time Calculation (min) 39 min    Activity Tolerance Patient tolerated treatment well    Behavior During Therapy [facility] for tasks assessed/performed             Past Medical History:  Diagnosis Date   Abdominal pain, left lower quadrant 08/14/2009   Qualifier: Diagnosis of  By: Laney Potash, Pam     Abnormal CT scan, stomach    Abnormal LFTs 06/19/2017   AICD (automatic cardioverter/defibrillator) present    Dr.Allred follows   Aortic arch pseudoaneurysm    a. followed by Dr. Prescott Gum.   Arthritis    Asthma    Benign neoplasm of colon    CAD (coronary artery disease) 02/2009   a. anterior STEMI rx with BMS to prox LAD in 02/2009. b. ISR s/p PTCA 06/2009. c. ISR s/p thrombectomy & PTCA 03/2010 due to late stent thrombosis. // Myoview 04/2019: EF 28, ant, ant-sept, inf-sept scar, no ischemia, high risk (stable>>cont med Rx)    Cardiomyopathy, ischemic 06/12/2011   Chronic renal insufficiency    stage 3   Chronic systolic CHF (congestive heart failure) ([place])    a. s/p ST. Jude ICD Oct 22, 2011.   Chronic systolic heart failure ([place]) 08/05/2011   Colon polyp    Complication of anesthesia    COPD (chronic obstructive pulmonary disease) ([place])    CORONARY ARTERY DISEASE 04/24/2009   Qualifier: Diagnosis of  By: Shane Crutch, Amy S    DDD (degenerative disc disease), lumbar 05/16/2011   Depression    "since my son died in 11-22-2022" 10/21/2017)   Diverticulosis    DIVERTICULOSIS-COLON  08/14/2009   Qualifier: Diagnosis of  By: Trellis Paganini PA-c, Amy S    DYSPHAGIA UNSPECIFIED 04/24/2009   Qualifier: Diagnosis of  By: Laney Potash, Pam     Dyspnea    "when I lay down at night"    Essential hypertension 09/05/2009   Qualifier: Diagnosis of  By: Harvest Dark CMA, Jennifer     Gastric polyp    GERD 10/19/2007   Qualifier: Diagnosis of  By: Rennie Plowman RN, Blanca Friend: Diagnosis of  By: Laney Potash, Pam     GERD (gastroesophageal reflux disease)    GI bleed    a. h/o GIB on DAPT, now on ASA only.   Headache 01/03/2013   Hearing loss    left ear   History of pulmonary embolus (PE) 01/02/2011   Hyperlipemia    Hyperlipidemia, unspecified 04/24/2009   Qualifier: Diagnosis of  By: Trellis Paganini PA-c, Amy S    Hypertension    ICD-St.Jude 08/06/2011   S/p St. Jude ICD placement 08/05/11    Insomnia    Irritable bowel syndrome    Ischemic cardiomyopathy    EF 10-15%   Memory loss    Migraine headache without aura    Myocardial infarct ([place]) 10-21-09 x 2   Dr. Cooper,cardiology    PERSONAL [place]  04/24/2009   Qualifier: Diagnosis of  By: Shane Crutch, Amy S November, 2011 colonoscopy demonstrated a sessile cecal polyp and sigmoid polyp    Pneumonia    PONV (postoperative nausea and vomiting)    Pre-diabetes    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/05/2012   Pulmonary embolism ([place]) 2011   Stroke [facility])    'When I was young." no residual   Thoracic aortic aneurysm 03/31/2018   TOBACCO ABUSE 04/04/2009   Qualifier: Diagnosis of  By: Arvid Right  Qualifier: Diagnosis of  By: [name], MD, Jaquelyn Bitter    Transfusion history    ?'12 or '13   UNSPECIFIED ANEMIA 07/03/2010   Qualifier: Diagnosis of  By: Shelda Pal     Unspecified mastoiditis     Past Surgical History:  Procedure Laterality Date   ANGIOPLASTY  07/02/09, 04/01/10   BILATERAL SALPINGOOPHORECTOMY     CAD( bare metal stent)  02/2009   x 1   CAROTID-SUBCLAVIAN BYPASS GRAFT Left 03/31/2018   Procedure:  LEFT SUBCLAVIAN ARTERY BYPASS GRAFT;  Surgeon: Serafina Mitchell, MD;  [facility];  Service: Vascular;  Laterality: Left;   CERVICAL SPINE SURGERY  08/08   COLONOSCOPY WITH PROPOFOL N/A 04/29/2016   Procedure: COLONOSCOPY WITH PROPOFOL;  Surgeon: [name], MD;  [facility];  Service: Gastroenterology;  Laterality: N/A;   ESOPHAGOGASTRODUODENOSCOPY N/A 12/09/2016   Procedure: ESOPHAGOGASTRODUODENOSCOPY (EGD);  Surgeon: [name], MD;  [facility];  Service: Gastroenterology;  Laterality: N/A;   ESOPHAGOGASTRODUODENOSCOPY (EGD) WITH PROPOFOL N/A 04/29/2016   Procedure: ESOPHAGOGASTRODUODENOSCOPY (EGD) WITH PROPOFOL;  Surgeon: [name], MD;  [facility];  Service: Gastroenterology;  Laterality: N/A;   EUS N/A 05/15/2016   Procedure: UPPER ENDOSCOPIC ULTRASOUND (EUS) RADIAL;  Surgeon: Milus Banister, MD;  [facility];  Service: Endoscopy;  Laterality: N/A;   IMPLANTABLE CARDIOVERTER DEFIBRILLATOR IMPLANT N/A 08/05/2011   Primary prevention SJM ICD implanted,  Analyze ST study patient   INNER EAR SURGERY     left x 17   LUMBAR DISC SURGERY  02/2008   fusion   NASAL SEPTUM SURGERY     THORACIC AORTIC ENDOVASCULAR STENT GRAFT N/A 03/31/2018   Procedure: THORACIC AORTIC ENDOVASCULAR STENT GRAFT;  Surgeon: Serafina Mitchell, MD;  Location: [facility];  Service: Vascular;  Laterality: N/A;   TOTAL ABDOMINAL HYSTERECTOMY     complete    There were no vitals filed for this visit.   Subjective Assessment - 06/10/21 1452     Subjective I have had some leakage over the weekend.  It seemed to be worse.  Iwas coughing more and it happened when coughing    Patient Stated Goals stop having leakage                               [facility] Adult PT Treatment/Exercise - 06/10/21 0001       Neuro Re-ed    Neuro Re-ed Details  breathing and exhale with exertion focus on kegel and TrA activation      Lumbar  Exercises: Stretches   Other Lumbar Stretch Exercise h/s and lumbar flexion stetches in sitting      Lumbar Exercises: Aerobic   Nustep L1 PT present for status update core warm up; 7 min      Lumbar Exercises: Machines for Strengthening   Leg Press seat 7; 40lb - 20x      Lumbar Exercises: Standing  Shoulder Extension Strengthening;20 reps;Theraband    Theraband Level (Shoulder Extension) Level 3 (Green)    Other Standing Lumbar Exercises walking with red band isometric flexion    Other Standing Lumbar Exercises exhale lifting with blowing - 2lb bicep curl      Lumbar Exercises: Seated   Other Seated Lumbar Exercises lifting 2lb with kegel                       PT Short Term Goals - 04/16/21 1156       PT SHORT TERM GOAL #1   Title ind with initial HEP    Status Achieved      PT SHORT TERM GOAL #2   Title Pt will demonstrate correctly performing kegel without co-contraction and not holding her breath    Status Achieved               PT Long Term Goals - 06/03/21 1446       PT LONG TERM GOAL #1   Title Pt will be able to go out and not have to stay home due to fecal incontinence    Status On-going      PT LONG TERM GOAL #2   Title ind with advanced HEP    Status On-going      PT LONG TERM GOAL #3   Title Pt will use 1-2 pad per day at most due to 50% less leakage    Baseline had only used 1 pad/day just in case this week    Status On-going      PT LONG TERM GOAL #4   Title Pt will report 50% reduction of leakage with cough or sneeze    Baseline I sometimes have urine leakage    Status On-going                   Plan - 06/10/21 1514     Clinical Impression Statement Pt still needing a lot of cues to breathe correctly during the exercises.  Pt seemed to get more natural breathing patterns toward the end of the treatment but she tends to tighten chest and shoulder and breathe very shallow without cues.  Pt will benefit from skilled PT  to continue reinforce correctly performing the exercises.  She will need more review of knack as she did not quite get this today.    PT Treatment/Interventions ADLs/Self Care Home Management;Biofeedback;Cryotherapy;Electrical Stimulation;Moist Heat;Neuromuscular re-education;Therapeutic activities;Therapeutic exercise;Gait training;Passive range of motion;Manual techniques;Taping;Patient/family education;Dry needling    PT Next Visit Plan cont with exhale on exertion; progress core and pelvic floor strength, knack, nustep, leg press    PT Home Exercise Plan Access Code: [email or web address]    Consulted and Agree with Plan of Care Patient             Patient will benefit from skilled therapeutic intervention in order to improve the following deficits and impairments:  Postural dysfunction, Decreased strength, Decreased coordination, Decreased endurance, Difficulty walking  Visit Diagnosis: Unspecified lack of coordination  Muscle weakness (generalized)     Problem List Patient Active Problem List   Diagnosis Date Noted   Chronic insomnia 11/14/2019   Grief at loss of child 08/05/2018   Colon polyp 06/16/2018   Thoracic aortic aneurysm 03/31/2018   Chronic renal insufficiency    AICD (automatic cardioverter/defibrillator) present    Chronic systolic CHF (congestive heart failure) (HCC)    COPD (chronic obstructive pulmonary disease) ([place])    Hyperlipemia    Insomnia  Pre-diabetes 06/19/2017   Gastric polyp    Osteoporosis 05/11/2013   Hearing loss 04/29/2013   Mild cognitive impairment 04/29/2013   Headache 01/03/2013   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/05/2012   ICD-St.Jude 08/06/2011   Cardiomyopathy, ischemic 06/12/2011   DDD (degenerative disc disease), lumbar 05/16/2011   History of pulmonary embolus (PE) 01/02/2011   UNSPECIFIED ANEMIA 07/03/2010   Essential hypertension 09/05/2009   DIVERTICULOSIS-COLON 08/14/2009   Hyperlipidemia, unspecified 04/24/2009   DYSPHAGIA  UNSPECIFIED 04/24/2009   PERSONAL HX COLONIC POLYPS 04/24/2009   TOBACCO ABUSE 04/04/2009   CAD (coronary artery disease) 02/25/2009   GERD 10/19/2007   IBS 10/19/2007   COLONIC POLYPS, ADENOMATOUS 05/12/2007    [name], PT 06/10/2021, 4:13 PM  [place] @ [place], [place], [address] Phone: [phone]   Fax:  [phone]  Name: [name] MRN: [id number] Date of Birth: [birth year]

## 2021-06-11 ENCOUNTER — Other Ambulatory Visit: Payer: Self-pay | Admitting: *Deleted

## 2021-06-11 LAB — BASIC METABOLIC PANEL
BUN/Creatinine Ratio: 15 (ref 12–28)
BUN: 34 mg/dL — ABNORMAL HIGH (ref 8–27)
CO2: 18 mmol/L — ABNORMAL LOW (ref 20–29)
Calcium: 9.3 mg/dL (ref 8.7–10.3)
Chloride: 104 mmol/L (ref 96–106)
Creatinine, Ser: 2.3 mg/dL — ABNORMAL HIGH (ref 0.57–1.00)
Glucose: 126 mg/dL — ABNORMAL HIGH (ref 70–99)
Potassium: 4.5 mmol/L (ref 3.5–5.2)
Sodium: 140 mmol/L (ref 134–144)
eGFR: 22 mL/min/{1.73_m2} — ABNORMAL LOW (ref >59)

## 2021-06-17 ENCOUNTER — Ambulatory Visit: Payer: Medicare Other | Admitting: Physical Therapy

## 2021-06-17 ENCOUNTER — Encounter: Payer: Self-pay | Admitting: Physical Therapy

## 2021-06-17 ENCOUNTER — Other Ambulatory Visit: Payer: Self-pay

## 2021-06-17 DIAGNOSIS — M6281 Muscle weakness (generalized): Secondary | ICD-10-CM

## 2021-06-17 DIAGNOSIS — R279 Unspecified lack of coordination: Secondary | ICD-10-CM

## 2021-06-17 NOTE — Therapy (Signed)
[facility] @ [facility] [place], [place], [address] Phone: [phone]   Fax:  [phone]  Physical Therapy Treatment  Patient Details  Name: [name] MRN: [id number] Date of Birth: April 30, 1946 Referring Provider (PT): Alfredia Ferguson, [place]   Encounter Date: 06/17/2021   PT End of Session - 06/17/21 1448     Visit Number 6    Date for PT Re-Evaluation 06/27/21    Authorization Type UHC    PT Start Time 6962    PT Stop Time 1525/09/10    PT Time Calculation (min) 42 min    Activity Tolerance Patient tolerated treatment well    Behavior During Therapy [facility] for tasks assessed/performed             Past Medical History:  Diagnosis Date   Abdominal pain, left lower quadrant 08/14/2009   Qualifier: Diagnosis of  By: Laney Potash, Pam     Abnormal CT scan, stomach    Abnormal LFTs 06/19/2017   AICD (automatic cardioverter/defibrillator) present    Dr.Allred follows   Aortic arch pseudoaneurysm    a. followed by Dr. Prescott Gum.   Arthritis    Asthma    Benign neoplasm of colon    CAD (coronary artery disease) 02/2009   a. anterior STEMI rx with BMS to prox LAD in 02/2009. b. ISR s/p PTCA 06/2009. c. ISR s/p thrombectomy & PTCA 03/2010 due to late stent thrombosis. // Myoview 04/2019: EF 28, ant, ant-sept, inf-sept scar, no ischemia, high risk (stable>>cont med Rx)    Cardiomyopathy, ischemic 06/12/2011   Chronic renal insufficiency    stage 3   Chronic systolic CHF (congestive heart failure) ([place])    a. s/p ST. Jude ICD Sep 11, 2011.   Chronic systolic heart failure ([place]) 08/05/2011   Colon polyp    Complication of anesthesia    COPD (chronic obstructive pulmonary disease) ([place])    CORONARY ARTERY DISEASE 04/24/2009   Qualifier: Diagnosis of  By: Shane Crutch, Amy S    DDD (degenerative disc disease), lumbar 05/16/2011   Depression    "since my son died in 2022-12-09" 09-10-2017)   Diverticulosis    DIVERTICULOSIS-COLON  08/14/2009   Qualifier: Diagnosis of  By: Trellis Paganini PA-c, Amy S    DYSPHAGIA UNSPECIFIED 04/24/2009   Qualifier: Diagnosis of  By: Laney Potash, Pam     Dyspnea    "when I lay down at night"    Essential hypertension 09/05/2009   Qualifier: Diagnosis of  By: Harvest Dark CMA, Jennifer     Gastric polyp    GERD 10/19/2007   Qualifier: Diagnosis of  By: Rennie Plowman RN, Blanca Friend: Diagnosis of  By: Laney Potash, Pam     GERD (gastroesophageal reflux disease)    GI bleed    a. h/o GIB on DAPT, now on ASA only.   Headache 01/03/2013   Hearing loss    left ear   History of pulmonary embolus (PE) 01/02/2011   Hyperlipemia    Hyperlipidemia, unspecified 04/24/2009   Qualifier: Diagnosis of  By: Trellis Paganini PA-c, Amy S    Hypertension    ICD-St.Jude 08/06/2011   S/p St. Jude ICD placement 08/05/11    Insomnia    Irritable bowel syndrome    Ischemic cardiomyopathy    EF 10-15%   Memory loss    Migraine headache without aura    Myocardial infarct ([place]) 09-10-09 x 2   Dr. Cooper,cardiology    PERSONAL [place]  04/24/2009   Qualifier: Diagnosis of  By: Shane Crutch, Amy S November, 2011 colonoscopy demonstrated a sessile cecal polyp and sigmoid polyp    Pneumonia    PONV (postoperative nausea and vomiting)    Pre-diabetes    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/05/2012   Pulmonary embolism ([place]) 2011   Stroke [facility])    'When I was young." no residual   Thoracic aortic aneurysm 03/31/2018   TOBACCO ABUSE 04/04/2009   Qualifier: Diagnosis of  By: Arvid Right  Qualifier: Diagnosis of  By: [name], MD, Jaquelyn Bitter    Transfusion history    ?'12 or '13   UNSPECIFIED ANEMIA 07/03/2010   Qualifier: Diagnosis of  By: Shelda Pal     Unspecified mastoiditis     Past Surgical History:  Procedure Laterality Date   ANGIOPLASTY  07/02/09, 04/01/10   BILATERAL SALPINGOOPHORECTOMY     CAD( bare metal stent)  02/2009   x 1   CAROTID-SUBCLAVIAN BYPASS GRAFT Left 03/31/2018   Procedure:  LEFT SUBCLAVIAN ARTERY BYPASS GRAFT;  Surgeon: Serafina Mitchell, MD;  [facility];  Service: Vascular;  Laterality: Left;   CERVICAL SPINE SURGERY  08/08   COLONOSCOPY WITH PROPOFOL N/A 04/29/2016   Procedure: COLONOSCOPY WITH PROPOFOL;  Surgeon: [name], MD;  [facility];  Service: Gastroenterology;  Laterality: N/A;   ESOPHAGOGASTRODUODENOSCOPY N/A 12/09/2016   Procedure: ESOPHAGOGASTRODUODENOSCOPY (EGD);  Surgeon: [name], MD;  [facility]: [facility];  Service: Gastroenterology;  Laterality: N/A;   ESOPHAGOGASTRODUODENOSCOPY (EGD) WITH PROPOFOL N/A 04/29/2016   Procedure: ESOPHAGOGASTRODUODENOSCOPY (EGD) WITH PROPOFOL;  Surgeon: [name], MD;  [facility];  Service: Gastroenterology;  Laterality: N/A;   EUS N/A 05/15/2016   Procedure: UPPER ENDOSCOPIC ULTRASOUND (EUS) RADIAL;  Surgeon: Milus Banister, MD;  [facility];  Service: Endoscopy;  Laterality: N/A;   IMPLANTABLE CARDIOVERTER DEFIBRILLATOR IMPLANT N/A 08/05/2011   Primary prevention SJM ICD implanted,  Analyze ST study patient   INNER EAR SURGERY     left x 17   LUMBAR DISC SURGERY  02/2008   fusion   NASAL SEPTUM SURGERY     THORACIC AORTIC ENDOVASCULAR STENT GRAFT N/A 03/31/2018   Procedure: THORACIC AORTIC ENDOVASCULAR STENT GRAFT;  Surgeon: Serafina Mitchell, MD;  Location: [place];  Service: Vascular;  Laterality: N/A;   TOTAL ABDOMINAL HYSTERECTOMY     complete    There were no vitals filed for this visit.   Subjective Assessment - 06/17/21 1543     Subjective I didn't have leakage and only using a pad just in case.  Pt denies any fecal incontinence this week    Patient Stated Goals stop having leakage    Currently in Pain? No/denies                               [facility] Adult PT Treatment/Exercise - 06/17/21 0001       Lumbar Exercises: Stretches   Active Hamstring Stretch Right;Left;3 reps;20 seconds    Hip Flexor  Stretch Right;Left;20 seconds    Other Lumbar Stretch Exercise h/s and lumbar flexion stetches in sitting      Lumbar Exercises: Aerobic   Nustep L1 PT present for status update core warm up; 7 min      Lumbar Exercises: Machines for Strengthening   Leg Press seat 7; 50lb - 20x   slow lowering     Lumbar Exercises: Standing  Functional Squats 20 reps    Functional Squats Limitations pulling red band    Corporate treasurer;Theraband;20 reps   10x each side one at a time   Theraband Level (Row) Level 4 (Blue)    Other Standing Lumbar Exercises walking with red band isometric flexion      Lumbar Exercises: Seated   Sit to Stand 20 reps   lifting 3 lb with exhale on exertion bi curl then bi +overhead press                      PT Short Term Goals - 04/16/21 1156       PT SHORT TERM GOAL #1   Title ind with initial HEP    Status Achieved      PT SHORT TERM GOAL #2   Title Pt will demonstrate correctly performing kegel without co-contraction and not holding her breath    Status Achieved               PT Long Term Goals - 06/17/21 1458       PT LONG TERM GOAL #1   Title Pt will be able to go out and not have to stay home due to fecal incontinence    Baseline I have the same worry, but no issue this last week    Status On-going      PT LONG TERM GOAL #2   Title ind with advanced HEP    Status On-going      PT LONG TERM GOAL #3   Title Pt will use 1-2 pad per day at most due to 50% less leakage    Baseline had only used 1 pad/day just in case this week    Status Achieved      PT LONG TERM GOAL #4   Title Pt will report 50% reduction of leakage with cough or sneeze    Baseline it is better    Status Partially Met                   Plan - 06/17/21 1541     Clinical Impression Statement Pt has met long term goal for reduction in pad usage. States she has been only needing one just in case.  She has not had any accidents recently but states she  is still very worried about it.  Pt is making progress and did better with exhale on exertion today.  Pt will benefit from skilled PT to continue to progress towards achieving all funcitonal goals    PT Treatment/Interventions ADLs/Self Care Home Management;Biofeedback;Cryotherapy;Electrical Stimulation;Moist Heat;Neuromuscular re-education;Therapeutic activities;Therapeutic exercise;Gait training;Passive range of motion;Manual techniques;Taping;Patient/family education;Dry needling    PT Next Visit Plan cont with exhale on exertion; progress core and pelvic floor strength, knack, nustep, leg press    PT Home Exercise Plan Access Code: [email or web address]    Consulted and Agree with Plan of Care Patient             Patient will benefit from skilled therapeutic intervention in order to improve the following deficits and impairments:  Postural dysfunction, Decreased strength, Decreased coordination, Decreased endurance, Difficulty walking  Visit Diagnosis: No diagnosis found.     Problem List Patient Active Problem List   Diagnosis Date Noted   Chronic insomnia 11/14/2019   Grief at loss of child 08/05/2018   Colon polyp 06/16/2018   Thoracic aortic aneurysm 03/31/2018   Chronic renal insufficiency    AICD (automatic cardioverter/defibrillator) present    Chronic systolic  CHF (congestive heart failure) (HCC)    COPD (chronic obstructive pulmonary disease) ([place])    Hyperlipemia    Insomnia    Pre-diabetes 06/19/2017   Gastric polyp    Osteoporosis 05/11/2013   Hearing loss 04/29/2013   Mild cognitive impairment 04/29/2013   Headache 01/03/2013   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/05/2012   ICD-St.Jude 08/06/2011   Cardiomyopathy, ischemic 06/12/2011   DDD (degenerative disc disease), lumbar 05/16/2011   History of pulmonary embolus (PE) 01/02/2011   UNSPECIFIED ANEMIA 07/03/2010   Essential hypertension 09/05/2009   DIVERTICULOSIS-COLON 08/14/2009   Hyperlipidemia, unspecified  04/24/2009   DYSPHAGIA UNSPECIFIED 04/24/2009   PERSONAL HX COLONIC POLYPS 04/24/2009   TOBACCO ABUSE 04/04/2009   CAD (coronary artery disease) 02/25/2009   GERD 10/19/2007   IBS 10/19/2007   COLONIC POLYPS, ADENOMATOUS 05/12/2007    Jule Ser, PT 06/17/2021, 3:43 PM  [place] @ [address] [place], [place], [address] Phone: [phone]   Fax:  [phone]  Name: KARN DERK MRN: [id number] Date of Birth: 1946/01/12

## 2021-06-24 ENCOUNTER — Other Ambulatory Visit: Payer: Self-pay

## 2021-06-24 ENCOUNTER — Encounter: Payer: Self-pay | Admitting: Physical Therapy

## 2021-06-24 ENCOUNTER — Ambulatory Visit: Payer: Medicare Other | Admitting: Physical Therapy

## 2021-06-24 DIAGNOSIS — R279 Unspecified lack of coordination: Secondary | ICD-10-CM

## 2021-06-24 DIAGNOSIS — M6281 Muscle weakness (generalized): Secondary | ICD-10-CM | POA: Diagnosis not present

## 2021-06-24 NOTE — Therapy (Signed)
[place] @ [facility] [place], [place], [address] Phone: [phone]   Fax:  [phone]  Physical Therapy Treatment  Patient Details  Name: [name] MRN: [id number] Date of Birth: 1946/05/20 Referring Provider (PT): Alfredia Ferguson, [place]   Encounter Date: 06/24/2021   PT End of Session - 06/24/21 1449     Visit Number 7    Date for PT Re-Evaluation 06/27/21    Authorization Type UHC    PT Start Time 1856    PT Stop Time 1521    PT Time Calculation (min) 38 min    Activity Tolerance Patient tolerated treatment well    Behavior During Therapy [facility] for tasks assessed/performed             Past Medical History:  Diagnosis Date   Abdominal pain, left lower quadrant 08/14/2009   Qualifier: Diagnosis of  By: Laney Potash, Pam     Abnormal CT scan, stomach    Abnormal LFTs 06/19/2017   AICD (automatic cardioverter/defibrillator) present    Dr.Allred follows   Aortic arch pseudoaneurysm    a. followed by Dr. Prescott Gum.   Arthritis    Asthma    Benign neoplasm of colon    CAD (coronary artery disease) 02/2009   a. anterior STEMI rx with BMS to prox LAD in 02/2009. b. ISR s/p PTCA 06/2009. c. ISR s/p thrombectomy & PTCA 03/2010 due to late stent thrombosis. // Myoview 04/2019: EF 28, ant, ant-sept, inf-sept scar, no ischemia, high risk (stable>>cont med Rx)    Cardiomyopathy, ischemic 06/12/2011   Chronic renal insufficiency    stage 3   Chronic systolic CHF (congestive heart failure) ([place])    a. s/p ST. Jude ICD September 28, 2011.   Chronic systolic heart failure ([place]) 08/05/2011   Colon polyp    Complication of anesthesia    COPD (chronic obstructive pulmonary disease) ([place])    CORONARY ARTERY DISEASE 04/24/2009   Qualifier: Diagnosis of  By: Shane Crutch, Amy S    DDD (degenerative disc disease), lumbar 05/16/2011   Depression    "since my son died in 11-26-22" 2017/09/27)   Diverticulosis    DIVERTICULOSIS-COLON  08/14/2009   Qualifier: Diagnosis of  By: Trellis Paganini PA-c, Amy S    DYSPHAGIA UNSPECIFIED 04/24/2009   Qualifier: Diagnosis of  By: Laney Potash, Pam     Dyspnea    "when I lay down at night"    Essential hypertension 09/05/2009   Qualifier: Diagnosis of  By: Harvest Dark CMA, Jennifer     Gastric polyp    GERD 10/19/2007   Qualifier: Diagnosis of  By: Rennie Plowman RN, Blanca Friend: Diagnosis of  By: Laney Potash, Pam     GERD (gastroesophageal reflux disease)    GI bleed    a. h/o GIB on DAPT, now on ASA only.   Headache 01/03/2013   Hearing loss    left ear   History of pulmonary embolus (PE) 01/02/2011   Hyperlipemia    Hyperlipidemia, unspecified 04/24/2009   Qualifier: Diagnosis of  By: Trellis Paganini PA-c, Amy S    Hypertension    ICD-St.Jude 08/06/2011   S/p St. Jude ICD placement 08/05/11    Insomnia    Irritable bowel syndrome    Ischemic cardiomyopathy    EF 10-15%   Memory loss    Migraine headache without aura    Myocardial infarct ([place]) 27-Sep-2009 x 2   Dr. Cooper,cardiology    PERSONAL [place]  04/24/2009   Qualifier: Diagnosis of  By: Shane Crutch, Amy S November, 2011 colonoscopy demonstrated a sessile cecal polyp and sigmoid polyp    Pneumonia    PONV (postoperative nausea and vomiting)    Pre-diabetes    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/05/2012   Pulmonary embolism ([place]) 2011   Stroke [facility])    'When I was young." no residual   Thoracic aortic aneurysm 03/31/2018   TOBACCO ABUSE 04/04/2009   Qualifier: Diagnosis of  By: Arvid Right  Qualifier: Diagnosis of  By: [name], MD, Jaquelyn Bitter    Transfusion history    ?'12 or '13   UNSPECIFIED ANEMIA 07/03/2010   Qualifier: Diagnosis of  By: Shelda Pal     Unspecified mastoiditis     Past Surgical History:  Procedure Laterality Date   ANGIOPLASTY  07/02/09, 04/01/10   BILATERAL SALPINGOOPHORECTOMY     CAD( bare metal stent)  02/2009   x 1   CAROTID-SUBCLAVIAN BYPASS GRAFT Left 03/31/2018   Procedure:  LEFT SUBCLAVIAN ARTERY BYPASS GRAFT;  Surgeon: Serafina Mitchell, MD;  [facility];  Service: Vascular;  Laterality: Left;   CERVICAL SPINE SURGERY  08/08   COLONOSCOPY WITH PROPOFOL N/A 04/29/2016   Procedure: COLONOSCOPY WITH PROPOFOL;  Surgeon: [name], MD;  [facility];  Service: Gastroenterology;  Laterality: N/A;   ESOPHAGOGASTRODUODENOSCOPY N/A 12/09/2016   Procedure: ESOPHAGOGASTRODUODENOSCOPY (EGD);  Surgeon: [name], MD;  [facility];  Service: Gastroenterology;  Laterality: N/A;   ESOPHAGOGASTRODUODENOSCOPY (EGD) WITH PROPOFOL N/A 04/29/2016   Procedure: ESOPHAGOGASTRODUODENOSCOPY (EGD) WITH PROPOFOL;  Surgeon: [name], MD;  [facility];  Service: Gastroenterology;  Laterality: N/A;   EUS N/A 05/15/2016   Procedure: UPPER ENDOSCOPIC ULTRASOUND (EUS) RADIAL;  Surgeon: Milus Banister, MD;  [facility];  Service: Endoscopy;  Laterality: N/A;   IMPLANTABLE CARDIOVERTER DEFIBRILLATOR IMPLANT N/A 08/05/2011   Primary prevention SJM ICD implanted,  Analyze ST study patient   INNER EAR SURGERY     left x 17   LUMBAR DISC SURGERY  02/2008   fusion   NASAL SEPTUM SURGERY     THORACIC AORTIC ENDOVASCULAR STENT GRAFT N/A 03/31/2018   Procedure: THORACIC AORTIC ENDOVASCULAR STENT GRAFT;  Surgeon: Serafina Mitchell, MD;  Location: [place];  Service: Vascular;  Laterality: N/A;   TOTAL ABDOMINAL HYSTERECTOMY     complete    There were no vitals filed for this visit.   Subjective Assessment - 06/24/21 1447     Subjective Everything did real good this week    Patient Stated Goals stop having leakage    Currently in Pain? No/denies                               [facility] Adult PT Treatment/Exercise - 06/24/21 0001       Lumbar Exercises: Stretches   Active Hamstring Stretch Right;Left;3 reps;20 seconds    Hip Flexor Stretch Right;Left;20 seconds    Other Lumbar Stretch Exercise lumbar  flexion      Lumbar Exercises: Aerobic   Nustep L1 PT present for status update core warm up; 10 min      Lumbar Exercises: Machines for Strengthening   Leg Press seat 7; 40 x 20; 55lb - 20x   slow lowering     Lumbar Exercises: Chief Strategy Officer;Theraband;20 reps   10x each side one at a time   Theraband  Level (Row) Level 4 (Blue)    Shoulder Extension Strengthening;20 reps;Theraband    Theraband Level (Shoulder Extension) Level 3 (Green)    Other Standing Lumbar Exercises walking with red band isometric flexion      Lumbar Exercises: Seated   Sit to Stand 20 reps   lifting 5 lb kettle bell with exhale on exertion                      PT Short Term Goals - 04/16/21 1156       PT SHORT TERM GOAL #1   Title ind with initial HEP    Status Achieved      PT SHORT TERM GOAL #2   Title Pt will demonstrate correctly performing kegel without co-contraction and not holding her breath    Status Achieved               PT Long Term Goals - 06/24/21 1532       PT LONG TERM GOAL #1   Title Pt will be able to go out and not have to stay home due to fecal incontinence    Status Partially Met      PT LONG TERM GOAL #2   Title ind with advanced HEP    Status On-going      PT LONG TERM GOAL #3   Title Pt will use 1-2 pad per day at most due to 50% less leakage    Baseline had only used 1 pad/day just in case this week    Status Achieved      PT LONG TERM GOAL #4   Title Pt will report 50% reduction of leakage with cough or sneeze    Status Partially Met                   Plan - 06/24/21 1528     Clinical Impression Statement Pt did really well this week and states she is no longer using pads at night.  Pt was able to increase resistance with exercises today.  Pt will benefit from skilled PT to continue working towards all functional goals.    PT Treatment/Interventions ADLs/Self Care Home Management;Biofeedback;Cryotherapy;Electrical  Stimulation;Moist Heat;Neuromuscular re-education;Therapeutic activities;Therapeutic exercise;Gait training;Passive range of motion;Manual techniques;Taping;Patient/family education;Dry needling    PT Next Visit Plan re-eval; cont with exhale on exertion; progress core and pelvic floor strength, knack, nustep, leg press    PT Home Exercise Plan Access Code: [email or web address]    Consulted and Agree with Plan of Care Patient             Patient will benefit from skilled therapeutic intervention in order to improve the following deficits and impairments:  Postural dysfunction, Decreased strength, Decreased coordination, Decreased endurance, Difficulty walking  Visit Diagnosis: Unspecified lack of coordination  Muscle weakness (generalized)     Problem List Patient Active Problem List   Diagnosis Date Noted   Chronic insomnia 11/14/2019   Grief at loss of child 08/05/2018   Colon polyp 06/16/2018   Thoracic aortic aneurysm 03/31/2018   Chronic renal insufficiency    AICD (automatic cardioverter/defibrillator) present    Chronic systolic CHF (congestive heart failure) (HCC)    COPD (chronic obstructive pulmonary disease) ([place])    Hyperlipemia    Insomnia    Pre-diabetes 06/19/2017   Gastric polyp    Osteoporosis 05/11/2013   Hearing loss 04/29/2013   Mild cognitive impairment 04/29/2013   Headache 01/03/2013   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/05/2012  ICD-St.Jude 08/06/2011   Cardiomyopathy, ischemic 06/12/2011   DDD (degenerative disc disease), lumbar 05/16/2011   History of pulmonary embolus (PE) 01/02/2011   UNSPECIFIED ANEMIA 07/03/2010   Essential hypertension 09/05/2009   DIVERTICULOSIS-COLON 08/14/2009   Hyperlipidemia, unspecified 04/24/2009   DYSPHAGIA UNSPECIFIED 04/24/2009   PERSONAL HX COLONIC POLYPS 04/24/2009   TOBACCO ABUSE 04/04/2009   CAD (coronary artery disease) 02/25/2009   GERD 10/19/2007   IBS 10/19/2007   COLONIC POLYPS, ADENOMATOUS 05/12/2007     Jule Ser, PT 06/24/2021, 3:33 PM  [facility] @ [place], [place], [address] Phone: [phone]   Fax:  [phone]  Name: [name] MRN: [id number] Date of Birth: [birth year]

## 2021-06-28 ENCOUNTER — Ambulatory Visit (INDEPENDENT_AMBULATORY_CARE_PROVIDER_SITE_OTHER): Payer: Medicaid Other

## 2021-06-28 DIAGNOSIS — I255 Ischemic cardiomyopathy: Secondary | ICD-10-CM

## 2021-06-28 LAB — CUP PACEART REMOTE DEVICE CHECK
Battery Remaining Longevity: 17 mo
Battery Remaining Percentage: 14 %
Battery Voltage: 2.72 V
Brady Statistic RV Percent Paced: 1 %
Date Time Interrogation Session: 20221202022803
HighPow Impedance: 95 Ohm
HighPow Impedance: 95 Ohm
Implantable Lead Implant Date: 20130108
Implantable Lead Location: 753860
Implantable Pulse Generator Implant Date: 20130108
Lead Channel Impedance Value: 510 Ohm
Lead Channel Pacing Threshold Amplitude: 0.75 V
Lead Channel Pacing Threshold Pulse Width: 0.5 ms
Lead Channel Sensing Intrinsic Amplitude: 12 mV
Lead Channel Setting Pacing Amplitude: 2.5 V
Lead Channel Setting Pacing Pulse Width: 0.5 ms
Lead Channel Setting Sensing Sensitivity: 0.5 mV
Pulse Gen Serial Number: 1016523

## 2021-07-08 ENCOUNTER — Ambulatory Visit: Payer: Medicare Other | Attending: Physician Assistant | Admitting: Physical Therapy

## 2021-07-08 ENCOUNTER — Other Ambulatory Visit: Payer: Self-pay

## 2021-07-08 DIAGNOSIS — R279 Unspecified lack of coordination: Secondary | ICD-10-CM | POA: Diagnosis not present

## 2021-07-08 DIAGNOSIS — M6281 Muscle weakness (generalized): Secondary | ICD-10-CM | POA: Insufficient documentation

## 2021-07-08 NOTE — Therapy (Signed)
[facility] @ [address] [place], [place], [address] Phone: [phone]   Fax:  [phone]  Physical Therapy Treatment  Patient Details  Name: [name] MRN: [id number] Date of Birth: [birth year] Referring Provider (PT): Alfredia Ferguson, [place]   Encounter Date: 07/08/2021   PT End of Session - 07/08/21 1448     Visit Number 8    Date for PT Re-Evaluation 06/27/21    Authorization Type [employer]    PT Start Time 0037    PT Stop Time 1515    PT Time Calculation (min) 40 min    Activity Tolerance Patient tolerated treatment well    Behavior During Therapy [facility] for tasks assessed/performed             Past Medical History:  Diagnosis Date   Abdominal pain, left lower quadrant 08/14/2009   Qualifier: Diagnosis of  By: Laney Potash, Pam     Abnormal CT scan, stomach    Abnormal LFTs 06/19/2017   AICD (automatic cardioverter/defibrillator) present    Dr.Allred follows   Aortic arch pseudoaneurysm    a. followed by Dr. Prescott Gum.   Arthritis    Asthma    Benign neoplasm of colon    CAD (coronary artery disease) 02/2009   a. anterior STEMI rx with BMS to prox LAD in 02/2009. b. ISR s/p PTCA 06/2009. c. ISR s/p thrombectomy & PTCA 03/2010 due to late stent thrombosis. // Myoview 04/2019: EF 28, ant, ant-sept, inf-sept scar, no ischemia, high risk (stable>>cont med Rx)    Cardiomyopathy, ischemic 06/12/2011   Chronic renal insufficiency    stage 3   Chronic systolic CHF (congestive heart failure) ([place])    a. s/p ST. Jude ICD 09/18/2011.   Chronic systolic heart failure ([place]) 08/05/2011   Colon polyp    Complication of anesthesia    COPD (chronic obstructive pulmonary disease) ([place])    CORONARY ARTERY DISEASE 04/24/2009   Qualifier: Diagnosis of  By: Shane Crutch, Amy S    DDD (degenerative disc disease), lumbar 05/16/2011   Depression    "since my son died in 11-16-2022" September 17, 2017)   Diverticulosis    DIVERTICULOSIS-COLON  08/14/2009   Qualifier: Diagnosis of  By: Trellis Paganini PA-c, Amy S    DYSPHAGIA UNSPECIFIED 04/24/2009   Qualifier: Diagnosis of  By: Laney Potash, Pam     Dyspnea    "when I lay down at night"    Essential hypertension 09/05/2009   Qualifier: Diagnosis of  By: Harvest Dark CMA, Jennifer     Gastric polyp    GERD 10/19/2007   Qualifier: Diagnosis of  By: Rennie Plowman RN, Blanca Friend: Diagnosis of  By: Laney Potash, Pam     GERD (gastroesophageal reflux disease)    GI bleed    a. h/o GIB on DAPT, now on ASA only.   Headache 01/03/2013   Hearing loss    left ear   History of pulmonary embolus (PE) 01/02/2011   Hyperlipemia    Hyperlipidemia, unspecified 04/24/2009   Qualifier: Diagnosis of  By: Trellis Paganini PA-c, Amy S    Hypertension    ICD-St.Jude 08/06/2011   S/p St. Jude ICD placement 08/05/11    Insomnia    Irritable bowel syndrome    Ischemic cardiomyopathy    EF 10-15%   Memory loss    Migraine headache without aura    Myocardial infarct ([place]) 09-17-09 x 2   Dr. 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   Pulmonary embolism ([place]) 2011   Stroke [facility])    'When I was young." no residual   Thoracic aortic aneurysm 03/31/2018   TOBACCO ABUSE 04/04/2009   Qualifier: Diagnosis of  By: Arvid Right  Qualifier: Diagnosis of  By: [name], MD, Jaquelyn Bitter    Transfusion history    ?'12 or '13   UNSPECIFIED ANEMIA 07/03/2010   Qualifier: Diagnosis of  By: Shelda Pal     Unspecified mastoiditis     Past Surgical History:  Procedure Laterality Date   ANGIOPLASTY  07/02/09, 04/01/10   BILATERAL SALPINGOOPHORECTOMY     CAD( bare metal stent)  02/2009   x 1   CAROTID-SUBCLAVIAN BYPASS GRAFT Left 03/31/2018   Procedure:  LEFT SUBCLAVIAN ARTERY BYPASS GRAFT;  Surgeon: Serafina Mitchell, MD;  Location: [facility] OR;  Service: Vascular;  Laterality: Left;   CERVICAL SPINE SURGERY  08/08   COLONOSCOPY WITH PROPOFOL N/A 04/29/2016   Procedure: COLONOSCOPY WITH PROPOFOL;  Surgeon: [name], MD;  Location: [facility] ENDOSCOPY;  Service: Gastroenterology;  Laterality: N/A;   ESOPHAGOGASTRODUODENOSCOPY N/A 12/09/2016   Procedure: ESOPHAGOGASTRODUODENOSCOPY (EGD);  Surgeon: [name], MD;  Location: [facility];  Service: Gastroenterology;  Laterality: N/A;   ESOPHAGOGASTRODUODENOSCOPY (EGD) WITH PROPOFOL N/A 04/29/2016   Procedure: ESOPHAGOGASTRODUODENOSCOPY (EGD) WITH PROPOFOL;  Surgeon: [name], MD;  Location: [facility];  Service: Gastroenterology;  Laterality: N/A;   EUS N/A 05/15/2016   Procedure: UPPER ENDOSCOPIC ULTRASOUND (EUS) RADIAL;  Surgeon: Milus Banister, MD;  Location: [facility] ENDOSCOPY;  Service: Endoscopy;  Laterality: N/A;   IMPLANTABLE CARDIOVERTER DEFIBRILLATOR IMPLANT N/A 08/05/2011   Primary prevention SJM ICD implanted,  Analyze ST study patient   INNER EAR SURGERY     left x 17   LUMBAR DISC SURGERY  02/2008   fusion   NASAL SEPTUM SURGERY     THORACIC AORTIC ENDOVASCULAR STENT GRAFT N/A 03/31/2018   Procedure: THORACIC AORTIC ENDOVASCULAR STENT GRAFT;  Surgeon: Serafina Mitchell, MD;  Location: [facility];  Service: Vascular;  Laterality: N/A;   TOTAL ABDOMINAL HYSTERECTOMY     complete    There were no vitals filed for this visit.   Subjective Assessment - 07/08/21 1441     Subjective Pt enters with limp and reports her knee just hurt when she woke up.  I am doing more lately but I don't know why it is hurting.    Currently in Pain? Yes    Pain Score 6     Pain Location Knee    Pain Orientation Left;Medial    Pain Descriptors / Indicators Sore    Pain Type Acute pain    Pain Onset More than a month ago    Pain Frequency Intermittent    Aggravating Factors  standing  and walking    Pain Relieving Factors heat                               [facility] Adult PT Treatment/Exercise - 07/08/21 0001       Lumbar Exercises: Stretches   Active Hamstring Stretch Right;Left;3 reps;20 seconds    Other Lumbar Stretch Exercise lumbar flexion  Lumbar Exercises: Aerobic   Nustep L1 PT present for status update core warm up; 7 min      Lumbar Exercises: Seated   Long Arc Quad on Chair Strengthening;Both;20 reps      Lumbar Exercises: Supine   Clam 20 reps    Clam Limitations double and single leg    Large Ball Abdominal Isometric 20 reps    Large Ball Abdominal Isometric Limitations overhead and presses    Other Supine Lumbar Exercises red band shoulder ext and diagonals                     PT Education - 07/08/21 1529     Education Details Access Code: [email or web address]    Person(s) Educated Patient    Methods Explanation;Demonstration;Tactile cues;Verbal cues;Handout    Comprehension Verbalized understanding;Returned demonstration              PT Short Term Goals - 04/16/21 1156       PT SHORT TERM GOAL #1   Title ind with initial HEP    Status Achieved      PT SHORT TERM GOAL #2   Title Pt will demonstrate correctly performing kegel without co-contraction and not holding her breath    Status Achieved               PT Long Term Goals - 07/08/21 1521       PT LONG TERM GOAL #1   Title Pt will be able to go out and not have to stay home due to fecal incontinence    Baseline went to a show with a friend without any issues    Status Achieved      PT LONG TERM GOAL #2   Title ind with advanced HEP    Status Achieved      PT LONG TERM GOAL #3   Title Pt will use 1-2 pad per day at most due to 50% less leakage    Baseline had only used 1 pad/day just in case this week, 90% less    Status Achieved      PT LONG TERM GOAL #4   Title Pt will report 50% reduction of leakage with cough or sneeze    Baseline  90% better    Status Achieved                   Plan - 07/08/21 1522     Clinical Impression Statement Pt was having knee pain today so review of exercises that she was able to do.  Pt is 90% better and met all goals.  She will d/c with HEP    PT Treatment/Interventions ADLs/Self Care Home Management;Biofeedback;Cryotherapy;Electrical Stimulation;Moist Heat;Neuromuscular re-education;Therapeutic activities;Therapeutic exercise;Gait training;Passive range of motion;Manual techniques;Taping;Patient/family education;Dry needling    PT Next Visit Plan d/c    PT Home Exercise Plan Access Code: [email or web address]    Consulted and Agree with Plan of Care Patient             Patient will benefit from skilled therapeutic intervention in order to improve the following deficits and impairments:  Postural dysfunction, Decreased strength, Decreased coordination, Decreased endurance, Difficulty walking  Visit Diagnosis: Unspecified lack of coordination  Muscle weakness (generalized)     Problem List Patient Active Problem List   Diagnosis Date Noted   Chronic insomnia 11/14/2019   Grief at loss of child 08/05/2018   Colon polyp 06/16/2018   Thoracic aortic aneurysm 03/31/2018   Chronic renal insufficiency  AICD (automatic cardioverter/defibrillator) present    Chronic systolic CHF (congestive heart failure) (H[facility])    COPD (chronic obstructive pulmonary disease) ([place])    Hyperlipemia    Insomnia    Pre-diabetes 06/19/2017   Gastric polyp    Osteoporosis 05/11/2013   Hearing loss 04/29/2013   Mild cognitive impairment 04/29/2013   Headache 01/03/2013   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/05/2012   ICD-St.Jude 08/06/2011   Cardiomyopathy, ischemic 06/12/2011   DDD (degenerative disc disease), lumbar 05/16/2011   History of pulmonary embolus (PE) 01/02/2011   UNSPECIFIED ANEMIA 07/03/2010   Essential hypertension 09/05/2009   DIVERTICULOSIS-COLON 08/14/2009   Hyperlipidemia,  unspecified 04/24/2009   DYSPHAGIA UNSPECIFIED 04/24/2009   PERSONAL HX COLONIC POLYPS 04/24/2009   TOBACCO ABUSE 04/04/2009   CAD (coronary artery disease) 02/25/2009   GERD 10/19/2007   IBS 10/19/2007   COLONIC POLYPS, ADENOMATOUS 05/12/2007    Jule Ser, PT 07/08/2021, 3:29 PM  [facility] @ [facility] [place], [place], [address] Phone: [phone]   Fax:  [phone]  Name: [name] MRN: [id number] Date of Birth: 11-11-1945   PHYSICAL THERAPY DISCHARGE SUMMARY  Visits from Start of Care: 8  Current functional level related to goals / functional outcomes:   See above goals Remaining deficits: See above   Education / Equipment: HEP  Patient agrees to discharge. Patient goals were met. Patient is being discharged due to meeting the stated rehab goals.  Gustavus Bryant, PT 07/08/21 3:31 PM

## 2021-07-08 NOTE — Patient Instructions (Signed)
Access Code: [email or web address] URL: [email or web address] Date: 07/08/2021 Prepared by: Jari Favre  Exercises Supine Diaphragmatic Breathing - 3 x daily - 7 x weekly - 1 sets - 10 reps Doorway Pec Stretch at 90 Degrees Abduction - 1 x daily - 7 x weekly - 3 sets - 10 reps Seated Hamstring Stretch - 1 x daily - 7 x weekly - 1 sets - 3 reps - 30 sec hold Standing Hamstring Stretch with Step - 1 x daily - 7 x weekly - 1 sets - 3 reps - 30 sec hold Supine Hip Adductor Squeeze with Small Ball - 3 x daily - 7 x weekly - 1 sets - 10 reps - 1 sec hold Sit to Stand with Pelvic Floor Contraction - 3 x daily - 7 x weekly - 1 sets - 10 reps Supine Shoulder Horizontal Abduction with Resistance - 1 x daily - 7 x weekly - 3 sets - 10 reps Supine Shoulder Overhead Flexion with Medicine Ball - 1 x daily - 7 x weekly - 3 sets - 10 reps Hooklying Small March - 1 x daily - 7 x weekly - 2 sets - 10 reps Supine Hip Adductor Squeeze with Small Ball - 3 x daily - 7 x weekly - 1 sets - 10 reps - 3 sec hold Standing Row with Anchored Resistance - 1 x daily - 7 x weekly - 3 sets - 10 reps Hooklying Clamshells with Resistance - 1 x daily - 7 x weekly - 3 sets - 10 reps

## 2021-07-09 NOTE — Progress Notes (Signed)
Remote ICD transmission.   

## 2021-07-12 ENCOUNTER — Other Ambulatory Visit: Payer: Self-pay | Admitting: Cardiovascular Disease

## 2021-08-08 DIAGNOSIS — N1832 Chronic kidney disease, stage 3b: Secondary | ICD-10-CM | POA: Insufficient documentation

## 2021-08-08 NOTE — Progress Notes (Signed)
Cardiology Office Note:    Date:  08/09/2021   ID:  [name], DOB March 04, 1946, MRN [id number]  PCP:  Horald Pollen, MD  [facility] Cardiologist:  [name], MD Electrophysiologist:  Thompson Grayer, MD    Referring MD: Horald Pollen, *   Chief Complaint:  Follow-up for CAD, CHF    Patient Profile: Coronary artery disease  S/p anterior MI in 2010 >> PCI: BMS to prox LAD S/p multiple PCIs since MI in 2010 2/2 ISR Myoview 04/2019: Lg scar, no ischemia, EF 28 Chronic systolic CHF Ischemic CM S/p AICD Hx of pulmonary embolism  COPD Chronic kidney disease  Prior Gi bleeding  Hypertension  Hyperlipidemia  Thoracic aortic aneurysm S/p L carotid-subclavian transposition and endovascular repair of aneurysm 03/2018  Aortic atherosclerosis  Prior CV studies Echocardiogram 05/01/2021 No LV thrombus, EF 25-30, G1 DD, apical anteroseptal/apical/lateral AK, normal RVSF, mild RAE  Myoview 04/17/2021  Large infarct, no ischemia, EF 18; high risk  Echocardiogram 06/13/2020 EF 25-30  Myoview 04/28/2019 Very large area of no perfusion with bordering reduced perfusion across apex and in the bordering anterior, anteroseptal, and inferoseptal walls. No reversibility. Reduced EF (28) with diffuse wall motion abnormalities   ABI 03/15/18 Normal   Limited Echo 10/22/16 EF 25-30, apical and inf  HK   Echo 09/01/16 EF 25-30, ant-sept and apical AK, Gr 1 DD, mild LAE   Carotid US 09/08/16 bilat 1-39   LHC (06/2010):   prox LAD stent ok with 30-50% ISR, prox to mid RCA 30-40%.   Echo (04/2011):   Septal apical and ant HK, cannot r/o mural apical clot, EF 25%, mild LAE.   Nuclear (07/2013):   Large scar involving apex and apical ant and apical septal segments, no ischemia, EF 25%; High Risk  History of Present Illness:   [name] is a 76 y.o. female with the above problem list.  She was last seen by [name], NP with symptoms of chest pain  and shortness of breath.  She underwent stress testing which demonstrated large infarct but no ischemia.  Medical therapy was continued.  A follow-up echocardiogram demonstrated EF 46-96, mild diastolic dysfunction and normal RVSF.  She did see nephrology for increasing creatinine.  Of note, her [name] was stopped.  She saw Dr.  [name].  Her creatinine improved to 1.69.  Dr. [name] noted that ACE/ARB could be resumed with close follow-up.    She returns for follow-up.  She continues to have symptoms of shortness of breath.  She describes it as severe.  She has had chronic symptoms but her symptoms seem to have been worse over the past several months.  She has some chest tightness associated with her shortness of breath.  She describes her worst symptoms when she tries to carry something in from the car.  She often cannot make it to her front door.  She has to sit down often to catch her breath.  She has not had orthopnea, PND, leg edema, syncope.    Past Medical History:  Diagnosis Date   Abdominal pain, left lower quadrant 08/14/2009   Qualifier: Diagnosis of  By: Laney Potash, Pam     Abnormal CT scan, stomach    Abnormal LFTs 06/19/2017   AICD (automatic cardioverter/defibrillator) present    Dr.Allred follows   Aortic arch pseudoaneurysm    a. followed by Dr. Prescott Gum.   Arthritis    Asthma    Benign neoplasm of colon  CAD (coronary artery disease) 02/2009   a. anterior STEMI rx with BMS to prox LAD in 02/2009. b. ISR s/p PTCA 06/2009. c. ISR s/p thrombectomy & PTCA 03/2010 due to late stent thrombosis. // Myoview 04/2019: EF 28, ant, ant-sept, inf-sept scar, no ischemia, high risk (stable>>cont med Rx)    Cardiomyopathy, ischemic 06/12/2011   Chronic renal insufficiency    stage 3   Chronic systolic CHF (congestive heart failure) ([place])    a. s/p ST. Jude ICD 11/08/2011.   Chronic systolic heart failure ([place]) 08/05/2011   Colon polyp    Complication of anesthesia    COPD (chronic  obstructive pulmonary disease) ([place])    CORONARY ARTERY DISEASE 04/24/2009   Qualifier: Diagnosis of  By: Shane Crutch, Amy S    DDD (degenerative disc disease), lumbar 05/16/2011   Depression    "since my son died in 12/09/22" 11-07-2017)   Diverticulosis    DIVERTICULOSIS-COLON 08/14/2009   Qualifier: Diagnosis of  By: Trellis Paganini PA-c, Amy S    DYSPHAGIA UNSPECIFIED 04/24/2009   Qualifier: Diagnosis of  By: Laney Potash, Pam     Dyspnea    "when I lay down at night"    Essential hypertension 09/05/2009   Qualifier: Diagnosis of  By: Harvest Dark CMA, Jennifer     Gastric polyp    GERD 10/19/2007   Qualifier: Diagnosis of  By: Rennie Plowman RN, Blanca Friend: Diagnosis of  By: Laney Potash, Pam     GERD (gastroesophageal reflux disease)    GI bleed    a. h/o GIB on DAPT, now on ASA only.   Headache 01/03/2013   Hearing loss    left ear   History of pulmonary embolus (PE) 01/02/2011   Hyperlipemia    Hyperlipidemia, unspecified 04/24/2009   Qualifier: Diagnosis of  By: Trellis Paganini PA-c, Amy S    Hypertension    ICD-St.Jude 08/06/2011   S/p St. Jude ICD placement 08/05/11    Insomnia    Irritable bowel syndrome    Ischemic cardiomyopathy    EF 10-15%   Memory loss    Migraine headache without aura    Myocardial infarct ([place]) 11/07/09 x 2   Dr. 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   aspirin 81 MG tablet Take 81 mg by mouth every morning.   bisoprolol (ZEBETA) 5 MG tablet TAKE 1 TABLET BY MOUTH DAILY.   dexlansoprazole (DEXILANT) 60 MG capsule Take 1 capsule (60 mg total) by mouth daily.   famotidine (PEPCID) 20 MG tablet Take 1 tablet (20 mg total) by mouth 2 (two)  times daily. Please schedule a yearly follow up for further refills. Thank you.   furosemide (LASIX) 40 MG tablet Take 1 tablet (40 mg total) by mouth daily.   nitroGLYCERIN (NITROSTAT) 0.4 MG SL tablet Place 1 tablet (0.4 mg total) under the tongue every 5 (five) minutes as needed for chest pain.   polyethylene glycol powder (GLYCOLAX/MIRALAX) powder Take 17 g by mouth daily as needed for mild constipation.   PROAIR HFA 108 (90 Base) MCG/ACT inhaler 1 puff daily as needed.   rosuvastatin (CRESTOR) 20 MG tablet TAKE 1 TABLET (20 MG TOTAL) BY MOUTH AT BEDTIME. PLEASE MAKE YEARLY APPT WITH DR. Burt Knack FOR OCTOBER 2022 FOR FUTURE REFILLS.   topiramate (TOPAMAX) 50 MG tablet Take 1 tablet (50 mg total) by mouth daily.    Allergies:   Lisinopril, Sulfa antibiotics, and Sulfonamide derivatives   Social History   Tobacco Use   Smoking status: Former    Packs/day: 0.13    Years: 30.00    Pack years: 3.90    Types: Cigarettes   Smokeless tobacco: Never  Vaping Use   Vaping Use: Never used  Substance Use Topics   Alcohol use: No    Alcohol/week: 0.0 standard drinks   Drug use: No    Family Hx: The patient's family history includes Bladder Cancer in her brother; Breast cancer in her cousin; Cancer in her son; Colon cancer in her mother; Other in her brother; Throat cancer in her brother.  Review of Systems  Gastrointestinal:  Negative for hematochezia and melena.  Genitourinary:  Negative for hematuria.     EKGs/Labs/Other Test Reviewed:    EKG:  EKG is not ordered today.  The ekg ordered today demonstrates n/a  Recent Labs: 02/19/2021: ALT 11 04/09/2021: Hemoglobin 11.5; Platelets 248; TSH 5.580 05/22/2021: BNP 188.2 06/10/2021: BUN 34; Creatinine, Ser 2.30; Potassium 4.5; Sodium 140   Recent Lipid Panel No results for input(s): CHOL, TRIG, HDL, VLDL, LDLCALC, LDLDIRECT in the last 8760 hours.   Risk Assessment/Calculations:         Physical Exam:    VS:  BP 132/70    Pulse 96    Ht 5\' 2"  (1.575 m)    Wt 188 lb (85.3 kg)    SpO2 97%    BMI 34.39 kg/m     Wt Readings from Last 3 Encounters:  08/09/21 188 lb (85.3 kg)  04/17/21 182 lb (82.6 kg)  04/09/21 182 lb (82.6 kg)    Constitutional:      Appearance: Healthy appearance. Not in distress.  Neck:     Vascular: No JVR. JVD normal.  Pulmonary:     Effort: Pulmonary effort is normal.     Breath sounds: No wheezing. Rales (??Faint crackles b/l bases; o/w clear) present.  Cardiovascular:     Normal rate. Regular rhythm. Normal S1. Normal S2.      Murmurs: There is no murmur.  Edema:    Peripheral edema present.    Pretibial: bilateral trace edema of the pretibial area. Abdominal:     Palpations: Abdomen is soft. There is no hepatomegaly.  Skin:    General: Skin is warm and dry.  Neurological:     Mental Status: Alert and oriented to person, place and time.     Cranial Nerves: Cranial nerves are intact.        ASSESSMENT & PLAN:   HFrEF (heart failure with reduced ejection fraction) (HCC) Ischemic cardiomyopathy.  EF 25-30 by recent echocardiogram.  NYHA III.  Overall,  volume status appears stable on exam.  She has had worsening shortness of breath and chest discomfort the past several months.  She did have to stop her Entresto due to worsening renal function.  Her renal function has improved.  Nephrology has indicated that we could rechallenge her with ACE/ARB.  She does have some faint crackles on exam in her bilateral  lung bases.  Her neck veins are flat.  Her weight has increased since her last visit.  However, she has not noted any significant weight gain at home.  I would like to get her back on low-dose Entresto.  However, I question if we should increase her diuretic.  Given her renal function, I would not want to do both at the same time. Obtain BMET, BNP today Obtain chest x-ray If BNP elevated or edema on chest x-ray, adjust furosemide with close follow-up on renal function If BNP normal and no edema on chest x-ray, I will have her resume Entresto with close follow-up on renal function Follow-up with Dr. Burt Knack or me in 6 weeks Question if we should consider right heart catheterization versus referral to heart failure clinic QRS is 136 on most recent EKG.  Question if she would be a candidate for CRT. She is not a candidate for SGLT2 inhibitor, MRA  CAD (coronary artery disease) History of anterior STEMI in 2010 treated with BMS to the LAD.  She has had multiple PCI procedures since secondary to in-stent restenosis.  Her most recent nuclear stress test demonstrated extensive scar but no ischemia.  She does have chest discomfort associate with her shortness of breath.  Given her renal function, we should try to avoid cardiac catheterization if at all possible.  Her symptoms have remained fairly stable over the past few months.  Continue to try to adjust GDMT for heart failure.  Continue aspirin 81 mg daily, rosuvastatin 20 mg daily.  At follow-up, if blood pressure can tolerate, consider nitrates.  Thoracic aortic aneurysm [facility]) History of endovascular repair.  She has not seen vascular surgery in quite some time.  I will try to arrange follow-up with Dr. Trula Slade.  Essential hypertension Fair control.  As noted, I will try to resume her Entresto.  Continue bisoprolol 5 mg daily.  Stage 3b chronic kidney disease [facility]) She saw nephrology in December.  Her creatinine has improved.  Obtain follow-up BMET  today.  Review of nephrology's notes indicates that SGLT2 inhibitor should not be started.  Also, given her renal function, I do not think she can take an MRA.  Implantable cardioverter-defibrillator (ICD) in situ Follow-up with EP as planned.         Dispo:  Return in about 6 weeks (around 09/20/2021) for Routine Follow Up, w/ Dr. Burt Knack, or Richardson Dopp, PA-C.   Medication Adjustments/Labs and Tests Ordered: Current medicines are reviewed at length with the patient today.  Concerns regarding medicines are outlined above.  Tests Ordered: Orders Placed This Encounter  Procedures   DG Chest 2 View   Basic metabolic panel   Pro b natriuretic peptide (BNP)   Medication Changes: No orders of the defined types were placed in this encounter.  Signed, Richardson Dopp, PA-C  08/09/2021 12:40 PM    [facility] [address] [place], [place], [place]  [address] Phone: [phone]; Fax: [phone]

## 2021-08-09 ENCOUNTER — Other Ambulatory Visit: Payer: Self-pay

## 2021-08-09 ENCOUNTER — Encounter: Payer: Self-pay | Admitting: Physician Assistant

## 2021-08-09 ENCOUNTER — Ambulatory Visit
Admission: RE | Admit: 2021-08-09 | Discharge: 2021-08-09 | Disposition: A | Discharge status: Home / Self Care | Payer: Commercial Managed Care - HMO | Source: Ambulatory Visit | Attending: Physician Assistant | Admitting: Physician Assistant

## 2021-08-09 ENCOUNTER — Ambulatory Visit (INDEPENDENT_AMBULATORY_CARE_PROVIDER_SITE_OTHER): Payer: Commercial Managed Care - HMO | Admitting: Physician Assistant

## 2021-08-09 VITALS — BP 132/70 | HR 96 | Ht 62.0 in | Wt 188.0 lb

## 2021-08-09 DIAGNOSIS — I25119 Atherosclerotic heart disease of native coronary artery with unspecified angina pectoris: Secondary | ICD-10-CM | POA: Diagnosis not present

## 2021-08-09 DIAGNOSIS — N1832 Chronic kidney disease, stage 3b: Secondary | ICD-10-CM

## 2021-08-09 DIAGNOSIS — I5020 Unspecified systolic (congestive) heart failure: Secondary | ICD-10-CM | POA: Diagnosis not present

## 2021-08-09 DIAGNOSIS — E7800 Pure hypercholesterolemia, unspecified: Secondary | ICD-10-CM

## 2021-08-09 DIAGNOSIS — Z95810 Presence of automatic (implantable) cardiac defibrillator: Secondary | ICD-10-CM

## 2021-08-09 DIAGNOSIS — I10 Essential (primary) hypertension: Secondary | ICD-10-CM

## 2021-08-09 DIAGNOSIS — I712 Thoracic aortic aneurysm, without rupture, unspecified: Secondary | ICD-10-CM

## 2021-08-09 NOTE — Assessment & Plan Note (Signed)
Follow up with EP as planned. 

## 2021-08-09 NOTE — Assessment & Plan Note (Signed)
She saw nephrology in December.  Her creatinine has improved.  Obtain follow-up BMET today.  Review of nephrology's notes indicates that SGLT2 inhibitor should not be started.  Also, given her renal function, I do not think she can take an MRA.

## 2021-08-09 NOTE — Assessment & Plan Note (Signed)
History of endovascular repair.  She has not seen vascular surgery in quite some time.  I will try to arrange follow-up with Dr. Trula Slade.

## 2021-08-09 NOTE — Assessment & Plan Note (Addendum)
History of anterior STEMI in 2010 treated with BMS to the LAD.  She has had multiple PCI procedures since secondary to in-stent restenosis.  Her most recent nuclear stress test demonstrated extensive scar but no ischemia.  She does have chest discomfort associate with her shortness of breath.  Given her renal function, we should try to avoid cardiac catheterization if at all possible.  Her symptoms have remained fairly stable over the past few months.  Continue to try to adjust GDMT for heart failure.  Continue aspirin 81 mg daily, rosuvastatin 20 mg daily.  At follow-up, if blood pressure can tolerate, consider nitrates.

## 2021-08-09 NOTE — Assessment & Plan Note (Addendum)
Ischemic cardiomyopathy.  EF 25-30 by recent echocardiogram.  NYHA III.  Overall, volume status appears stable on exam.  She has had worsening shortness of breath and chest discomfort the past several months.  She did have to stop her Entresto due to worsening renal function.  Her renal function has improved.  Nephrology has indicated that we could rechallenge her with ACE/ARB.  She does have some faint crackles on exam in her bilateral lung bases.  Her neck veins are flat.  Her weight has increased since her last visit.  However, she has not noted any significant weight gain at home.  I would like to get her back on low-dose Entresto.  However, I question if we should increase her diuretic.  Given her renal function, I would not want to do both at the same time.  Obtain BMET, BNP today  Obtain chest x-ray  If BNP elevated or edema on chest x-ray, adjust furosemide with close follow-up on renal function  If BNP normal and no edema on chest x-ray, I will have her resume Entresto with close follow-up on renal function  Follow-up with Dr. Burt Knack or me in 6 weeks  Question if we should consider right heart catheterization versus referral to heart failure clinic  QRS is 136 on most recent EKG.  Question if she would be a candidate for CRT.  She is not a candidate for SGLT2 inhibitor, MRA

## 2021-08-09 NOTE — Assessment & Plan Note (Signed)
Fair control.  As noted, I will try to resume her Entresto.  Continue bisoprolol 5 mg daily.

## 2021-08-09 NOTE — Patient Instructions (Signed)
Medication Instructions:  Your physician recommends that you continue on your current medications as directed. Please refer to the Current Medication list given to you today.  *If you need a refill on your cardiac medications before your next appointment, please call your pharmacy*   Lab Work: TODAY:  BMET & PRO BNP  If you have labs (blood work) drawn today and your tests are completely normal, you will receive your results only by: [place] (if you have MyChart) OR A paper copy in the mail If you have any lab test that is abnormal or we need to change your treatment, we will call you to review the results.   Testing/Procedures: A chest x-ray takes a picture of the organs and structures inside the chest, including the heart, lungs, and blood vessels. This test can show several things, including, whether the heart is enlarges; whether fluid is building up in the lungs; and whether pacemaker / defibrillator leads are still in place. Go to [place], [place]   Follow-Up: At [facility], you and your health needs are our priority.  As part of our continuing mission to provide you with exceptional heart care, we have created designated Provider Care Teams.  These Care Teams include your primary Cardiologist (physician) and Advanced Practice Providers (APPs -  Physician Assistants and Nurse Practitioners) who all work together to provide you with the care you need, when you need it.  We recommend signing up for the patient portal called "MyChart".  Sign up information is provided on this After Visit Summary.  MyChart is used to connect with patients for Virtual Visits (Telemedicine).  Patients are able to view lab/test results, encounter notes, upcoming appointments, etc.  Non-urgent messages can be sent to your provider as well.   To learn more about what you can do with MyChart, go to [email or web address].    Your  next appointment:   09/20/21 ARRIVE AT 10:40  The format for your next appointment:   In Person  Provider:   Richardson Dopp, PA-C         Other Instructions

## 2021-08-12 ENCOUNTER — Other Ambulatory Visit: Payer: Self-pay | Admitting: *Deleted

## 2021-08-12 DIAGNOSIS — I712 Thoracic aortic aneurysm, without rupture, unspecified: Secondary | ICD-10-CM

## 2021-08-13 ENCOUNTER — Other Ambulatory Visit: Payer: Self-pay | Admitting: *Deleted

## 2021-08-13 LAB — BASIC METABOLIC PANEL
BUN/Creatinine Ratio: 15 (ref 12–28)
BUN: 30 mg/dL — ABNORMAL HIGH (ref 8–27)
CO2: 18 mmol/L — ABNORMAL LOW (ref 20–29)
Calcium: 9.4 mg/dL (ref 8.7–10.3)
Chloride: 108 mmol/L — ABNORMAL HIGH (ref 96–106)
Creatinine, Ser: 1.98 mg/dL — ABNORMAL HIGH (ref 0.57–1.00)
Glucose: 131 mg/dL — ABNORMAL HIGH (ref 70–99)
Potassium: 4.4 mmol/L (ref 3.5–5.2)
Sodium: 140 mmol/L (ref 134–144)
eGFR: 26 mL/min/{1.73_m2} — ABNORMAL LOW (ref >59)

## 2021-08-13 LAB — PRO B NATRIURETIC PEPTIDE: NT-Pro BNP: 945 pg/mL — ABNORMAL HIGH (ref 0–301)

## 2021-08-13 MED ORDER — ISOSORBIDE MONONITRATE ER 30 MG PO TB24: 15.0000 mg | ORAL_TABLET | Freq: Every day | ORAL | 3 refills | Status: DC

## 2021-08-13 MED ORDER — HYDRALAZINE HCL 10 MG PO TABS: 10.0000 mg | ORAL_TABLET | Freq: Three times a day (TID) | ORAL | 3 refills | Status: DC

## 2021-08-14 ENCOUNTER — Telehealth: Payer: Self-pay | Admitting: Physician Assistant

## 2021-08-14 ENCOUNTER — Other Ambulatory Visit: Payer: Self-pay | Admitting: *Deleted

## 2021-08-14 DIAGNOSIS — I5020 Unspecified systolic (congestive) heart failure: Secondary | ICD-10-CM

## 2021-08-14 MED ORDER — FUROSEMIDE 40 MG PO TABS
60.0000 mg | ORAL_TABLET | Freq: Every day | ORAL | 3 refills | Status: DC
Start: 2021-08-14 — End: 2021-12-09

## 2021-08-14 NOTE — Telephone Encounter (Signed)
Called transferred to [facility]

## 2021-08-19 ENCOUNTER — Other Ambulatory Visit: Payer: Self-pay

## 2021-08-19 ENCOUNTER — Other Ambulatory Visit: Payer: Medicare Other

## 2021-08-19 DIAGNOSIS — I5020 Unspecified systolic (congestive) heart failure: Secondary | ICD-10-CM

## 2021-08-19 LAB — BASIC METABOLIC PANEL
BUN/Creatinine Ratio: 13 (ref 12–28)
BUN: 25 mg/dL (ref 8–27)
CO2: 19 mmol/L — ABNORMAL LOW (ref 20–29)
Calcium: 8.8 mg/dL (ref 8.7–10.3)
Chloride: 108 mmol/L — ABNORMAL HIGH (ref 96–106)
Creatinine, Ser: 1.87 mg/dL — ABNORMAL HIGH (ref 0.57–1.00)
Glucose: 139 mg/dL — ABNORMAL HIGH (ref 70–99)
Potassium: 3.8 mmol/L (ref 3.5–5.2)
Sodium: 142 mmol/L (ref 134–144)
eGFR: 28 mL/min/{1.73_m2} — ABNORMAL LOW (ref >59)

## 2021-08-20 ENCOUNTER — Other Ambulatory Visit: Payer: Self-pay | Admitting: *Deleted

## 2021-08-20 DIAGNOSIS — N1832 Chronic kidney disease, stage 3b: Secondary | ICD-10-CM

## 2021-09-02 ENCOUNTER — Other Ambulatory Visit: Payer: Medicare Other | Admitting: *Deleted

## 2021-09-02 ENCOUNTER — Other Ambulatory Visit: Payer: Self-pay

## 2021-09-02 DIAGNOSIS — N1832 Chronic kidney disease, stage 3b: Secondary | ICD-10-CM

## 2021-09-02 LAB — BASIC METABOLIC PANEL
BUN/Creatinine Ratio: 15 (ref 12–28)
BUN: 28 mg/dL — ABNORMAL HIGH (ref 8–27)
CO2: 20 mmol/L (ref 20–29)
Calcium: 9.3 mg/dL (ref 8.7–10.3)
Chloride: 105 mmol/L (ref 96–106)
Creatinine, Ser: 1.92 mg/dL — ABNORMAL HIGH (ref 0.57–1.00)
Glucose: 99 mg/dL (ref 70–99)
Potassium: 4.2 mmol/L (ref 3.5–5.2)
Sodium: 141 mmol/L (ref 134–144)
eGFR: 27 mL/min/{1.73_m2} — ABNORMAL LOW (ref >59)

## 2021-09-03 ENCOUNTER — Other Ambulatory Visit: Payer: Medicaid Other

## 2021-09-19 NOTE — Progress Notes (Unsigned)
Cardiology Office Note:    Date:  09/19/2021   ID:  [name], DOB 04-25-1946, MRN [id number]  PCP:  Horald Pollen, [place] [facility] Cardiologist:  [name], MD Electrophysiologist:  Thompson Grayer, MD { Click to update primary MD,subspecialty MD or APP then REFRESH:1}  *** Referring MD: Horald Pollen, *   Chief Complaint:  No chief complaint on file. {Click here for Visit Info    :1}   Patient Profile: Coronary artery disease  S/p anterior MI in 2010 >> PCI: BMS to prox LAD S/p multiple PCIs since MI in 2010 2/2 ISR Myoview 04/2019: Lg scar, no ischemia, EF 28 Chronic systolic CHF Ischemic CM S/p AICD Hx of pulmonary embolism  COPD Chronic kidney disease  Prior Gi bleeding  Hypertension  Hyperlipidemia  Thoracic aortic aneurysm S/p L carotid-subclavian transposition and endovascular repair of aneurysm 03/2018  Aortic atherosclerosis   Prior CV studies Echocardiogram 05/01/2021 No LV thrombus, EF 25-30, G1 DD, apical anteroseptal/apical/lateral AK, normal RVSF, mild RAE   Myoview 04/17/2021  Large infarct, no ischemia, EF 18; high risk   Echocardiogram 06/13/2020 EF 25-30   Myoview 04/28/2019 Very large area of no perfusion with bordering reduced perfusion across apex and in the bordering anterior, anteroseptal, and inferoseptal walls. No reversibility. Reduced EF (28) with diffuse wall motion abnormalities   ABI 03/15/18 Normal   Limited Echo 10/22/16 EF 25-30, apical and inf  HK   Echo 09/01/16 EF 25-30, ant-sept and apical AK, Gr 1 DD, mild LAE   Carotid US 09/08/16 bilat 1-39   LHC (06/2010):   prox LAD stent ok with 30-50% ISR, prox to mid RCA 30-40%.   Echo (04/2011):   Septal apical and ant HK, cannot r/o mural apical clot, EF 25%, mild LAE.   Nuclear (07/2013):   Large scar involving apex and apical ant and apical septal segments, no ischemia, EF 25%; High Risk  History of Present Illness:   [name] is a  76 y.o. female with the above problem list.  She underwent stress testing in 9/22 to evaluate chest pain which demonstrated large infarct but no ischemia.  Medical therapy was continued.  A follow-up echocardiogram demonstrated EF 32-44, mild diastolic dysfunction and normal RVSF.  She did see nephrology for increasing creatinine.  Of note, her [name] was stopped.  She saw Dr.  [name].  Her creatinine improved to 1.69.  Dr. [name] noted that ACE/ARB could be resumed with close follow-up.   I saw her in f/u 08/09/21.  She continued to have significant shortness of breath and some chest pain.  I d/w Dr. Burt Knack.  We did not think she could tolerate re-challenge with Entresto.  I started her on hydralazine and nitrates.  Her BNP was elevated and I increased her Lasix.  A CXR was unremarkable.   She returns for f/u.  We plan to proceed with a RHC if she has persistent symptoms.  ***    Past Medical History:  Diagnosis Date   Abdominal pain, left lower quadrant 08/14/2009   Qualifier: Diagnosis of  By: Laney Potash, Pam     Abnormal CT scan, stomach    Abnormal LFTs 06/19/2017   AICD (automatic cardioverter/defibrillator) present    Dr.Allred follows   Aortic arch pseudoaneurysm    a. followed by Dr. Prescott Gum.   Arthritis    Asthma    Benign neoplasm of colon    CAD (coronary artery disease) 02/2009   a. anterior  STEMI rx with BMS to prox LAD in 02/2009. b. ISR s/p PTCA 06/2009. c. ISR s/p thrombectomy & PTCA 03/2010 due to late stent thrombosis. // Myoview 04/2019: EF 28, ant, ant-sept, inf-sept scar, no ischemia, high risk (stable>>cont med Rx)    Cardiomyopathy, ischemic 06/12/2011   Chronic renal insufficiency    stage 3   Chronic systolic CHF (congestive heart failure) ([place])    a. s/p ST. Jude ICD October 30, 2011.   Chronic systolic heart failure ([place]) 08/05/2011   Colon polyp    Complication of anesthesia    COPD (chronic obstructive pulmonary disease) ([place])    CORONARY ARTERY DISEASE 04/24/2009    Qualifier: Diagnosis of  By: Shane Crutch, Amy S    DDD (degenerative disc disease), lumbar 05/16/2011   Depression    "since my son died in 11/30/22" 10-29-17)   Diverticulosis    DIVERTICULOSIS-COLON 08/14/2009   Qualifier: Diagnosis of  By: Trellis Paganini PA-c, Amy S    DYSPHAGIA UNSPECIFIED 04/24/2009   Qualifier: Diagnosis of  By: Laney Potash, Pam     Dyspnea    "when I lay down at night"    Essential hypertension 09/05/2009   Qualifier: Diagnosis of  By: Harvest Dark CMA, Jennifer     Gastric polyp    GERD 10/19/2007   Qualifier: Diagnosis of  By: Rennie Plowman RN, Blanca Friend: Diagnosis of  By: Laney Potash, Pam     GERD (gastroesophageal reflux disease)    GI bleed    a. h/o GIB on DAPT, now on ASA only.   Headache 01/03/2013   Hearing loss    left ear   History of pulmonary embolus (PE) 01/02/2011   Hyperlipemia    Hyperlipidemia, unspecified 04/24/2009   Qualifier: Diagnosis of  By: Trellis Paganini PA-c, Amy S    Hypertension    ICD-St.Jude 08/06/2011   S/p St. Jude ICD placement 08/05/11    Insomnia    Irritable bowel syndrome    Ischemic cardiomyopathy    EF 10-15%   Memory loss    Migraine headache without aura    Myocardial infarct ([place]) 10/29/09 x 2   Dr. 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    Allergies:   Lisinopril, Sulfa antibiotics, and Sulfonamide derivatives   Social History   Tobacco Use   Smoking status: Former    Packs/day: 0.13    Years: 30.00    Pack years: 3.90    Types: Cigarettes   Smokeless tobacco: Never  Vaping Use   Vaping Use: Never used  Substance Use Topics   Alcohol use: No  Alcohol/week: 0.0 standard drinks   Drug use: No    Family Hx: The patient's family history includes Bladder Cancer in her brother; Breast cancer in her cousin; Cancer in her son; Colon cancer in her mother; Other in her brother; Throat cancer in her brother.  ROS   EKGs/Labs/Other Test Reviewed:    EKG:  EKG is *** ordered today.  The ekg ordered today demonstrates ***  Recent Labs: 02/19/2021: ALT 11 04/09/2021: Hemoglobin 11.5; Platelets 248; TSH 5.580 05/22/2021: BNP 188.2 08/09/2021: NT-Pro BNP 945 09/02/2021: BUN 28; Creatinine, Ser 1.92; Potassium 4.2; Sodium 141   Recent Lipid Panel No results for input(s): CHOL, TRIG, HDL, VLDL, LDLCALC, LDLDIRECT in the last 8760 hours.   Risk Assessment/Calculations:   {Does this patient have ATRIAL FIBRILLATION?:[phone]}     Physical Exam:    VS:  There were no vitals taken for this visit.    Wt Readings from Last 3 Encounters:  08/09/21 188 lb (85.3 kg)  04/17/21 182 lb (82.6 kg)  04/09/21 182 lb (82.6 kg)    Physical Exam ***     ASSESSMENT & PLAN:   No problem-specific Assessment & Plan notes found for this encounter. *** HFrEF (heart failure with reduced ejection fraction) (HCC) Ischemic cardiomyopathy.  EF 25-30 by recent echocardiogram.  NYHA III.  Overall, volume status appears stable on exam.  She has had worsening shortness of breath and chest discomfort the past several months.  She did have to stop her Entresto due to worsening renal function.  Her renal function  has improved.  Nephrology has indicated that we could rechallenge her with ACE/ARB.  She does have some faint crackles on exam in her bilateral lung bases.  Her neck veins are flat.  Her weight has increased since her last visit.  However, she has not noted any significant weight gain at home.  I would like to get her back on low-dose Entresto.  However, I question if we should increase her diuretic.  Given her renal function, I would not want to do both at the same time. Obtain BMET, BNP today Obtain chest x-ray If BNP elevated or edema on chest x-ray, adjust furosemide with close follow-up on renal function If BNP normal and no edema on chest x-ray, I will have her resume Entresto with close follow-up on renal function Follow-up with Dr. Burt Knack or me in 6 weeks Question if we should consider right heart catheterization versus referral to heart failure clinic QRS is 136 on most recent EKG.  Question if she would be a candidate for CRT. She is not a candidate for SGLT2 inhibitor, MRA   CAD (coronary artery disease) History of anterior STEMI in 2010 treated with BMS to the LAD.  She has had multiple PCI procedures since secondary to in-stent restenosis.  Her most recent nuclear stress test demonstrated extensive scar but no ischemia.  She does have chest discomfort associate with her shortness of breath.  Given her renal function, we should try to avoid cardiac catheterization if at all possible.  Her symptoms have remained fairly stable over the past few months.  Continue to try to adjust GDMT for heart failure.  Continue aspirin 81 mg daily, rosuvastatin 20 mg daily.  At follow-up, if blood pressure can tolerate, consider nitrates.   Thoracic aortic aneurysm [facility]) History of endovascular repair.  She has not seen vascular surgery in quite some time.  I will try to arrange follow-up with Dr. Trula Slade.   Essential hypertension Fair control.  As noted, I will try to resume her Entresto.  Continue  bisoprolol 5 mg daily.   Stage 3b chronic kidney disease [facility]) She saw nephrology in December.  Her creatinine has improved.  Obtain follow-up BMET today.  Review of nephrology's notes indicates that SGLT2 inhibitor should not be started.  Also, given her renal function, I do not think she can take an MRA.   Implantable cardioverter-defibrillator (ICD) in situ Follow-up with EP as planned.      {Are you ordering a CV Procedure (e.g. stress test, cath, DCCV, TEE, etc)?   Press F2        :[id number]}  Dispo:  No follow-ups on file.   Medication Adjustments/Labs and Tests Ordered: Current medicines are reviewed at length with the patient today.  Concerns regarding medicines are outlined above.  Tests Ordered: No orders of the defined types were placed in this encounter.  Medication Changes: No orders of the defined types were placed in this encounter.  Signed, Richardson Dopp, PA-C  09/19/2021 [facility] [address], [place], [place]  [address] Phone: [phone]; Fax: [phone]

## 2021-09-20 ENCOUNTER — Ambulatory Visit: Payer: Medicaid Other | Admitting: Physician Assistant

## 2021-09-20 DIAGNOSIS — I10 Essential (primary) hypertension: Secondary | ICD-10-CM

## 2021-09-20 DIAGNOSIS — N1832 Chronic kidney disease, stage 3b: Secondary | ICD-10-CM

## 2021-09-20 DIAGNOSIS — I712 Thoracic aortic aneurysm, without rupture, unspecified: Secondary | ICD-10-CM

## 2021-09-20 DIAGNOSIS — I25119 Atherosclerotic heart disease of native coronary artery with unspecified angina pectoris: Secondary | ICD-10-CM

## 2021-09-20 DIAGNOSIS — I5022 Chronic systolic (congestive) heart failure: Secondary | ICD-10-CM

## 2021-09-23 ENCOUNTER — Encounter: Payer: Self-pay | Admitting: Surgery

## 2021-09-23 ENCOUNTER — Ambulatory Visit (INDEPENDENT_AMBULATORY_CARE_PROVIDER_SITE_OTHER): Payer: Medicare Other | Admitting: Surgery

## 2021-09-23 ENCOUNTER — Other Ambulatory Visit: Payer: Self-pay

## 2021-09-23 ENCOUNTER — Ambulatory Visit (HOSPITAL_COMMUNITY)
Admission: RE | Admit: 2021-09-23 | Discharge: 2021-09-23 | Disposition: A | Discharge status: Home / Self Care | Payer: Medicare Other | Source: Ambulatory Visit | Attending: Surgery | Admitting: Surgery

## 2021-09-23 VITALS — BP 135/74 | HR 77 | Temp 97.9°F | Resp 20 | Ht 62.0 in | Wt 185.0 lb

## 2021-09-23 DIAGNOSIS — I7113 Aneurysm of the descending thoracic aorta, ruptured: Secondary | ICD-10-CM | POA: Diagnosis not present

## 2021-09-23 DIAGNOSIS — I6523 Occlusion and stenosis of bilateral carotid arteries: Secondary | ICD-10-CM

## 2021-09-23 NOTE — Progress Notes (Signed)
[facility] [place]  Patient name: [name] MRN: [id number] DOB: 03-08-1946 Sex: female   REASON FOR VISIT:    Follow up  HISOTRY OF PRESENT ILLNESS:     [name] is a 76 y.o. 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  She underwent left subclavian to left carotid artery transposition and endovascular repair of her thoracic aortic ulcer with aneurysmal degeneration on 03/31/2018.  Her postoperative course was uncomplicated and she was discharged home on postoperative day 1.   She denies any significant chest or back pain.  Her biggest issue is shortness of breath.   She continues to take a statin for hypercholesterolemia.  She continues to smoke.  PAST MEDICAL HISTORY:   Past Medical History:  Diagnosis Date   Abdominal pain, left lower quadrant 08/14/2009   Qualifier: Diagnosis of  By: Laney Potash, Pam     Abnormal CT scan, stomach    Abnormal LFTs 06/19/2017   AICD (automatic cardioverter/defibrillator) present    Dr.Allred follows   Aortic arch pseudoaneurysm    a. followed by Dr. Prescott Gum.   Arthritis    Asthma    Benign neoplasm of colon    CAD (coronary artery disease) 02/2009   a. anterior STEMI rx with BMS to prox LAD in 02/2009. b. ISR s/p PTCA 06/2009. c. ISR s/p thrombectomy & PTCA 03/2010 due to late stent thrombosis. // Myoview 04/2019: EF 28, ant, ant-sept, inf-sept scar, no ischemia, high risk (stable>>cont med Rx)    Cardiomyopathy, ischemic 06/12/2011   Chronic renal insufficiency    stage 3   Chronic systolic CHF (congestive heart failure) ([place] [facility])    a. s/p ST. Jude ICD 10-14-2011.   Chronic systolic heart failure ([place]) 08/05/2011   Colon polyp    Complication of anesthesia    COPD (chronic obstructive pulmonary disease) ([place])    CORONARY ARTERY DISEASE 04/24/2009   Qualifier: Diagnosis of  By: Shane Crutch, Amy S    DDD (degenerative disc  disease), lumbar 05/16/2011   Depression    "since my son died in 11/14/2022" 13-Oct-2017)   Diverticulosis    DIVERTICULOSIS-COLON 08/14/2009   Qualifier: Diagnosis of  By: Trellis Paganini PA-c, Amy S    DYSPHAGIA UNSPECIFIED 04/24/2009   Qualifier: Diagnosis of  By: Laney Potash, Pam     Dyspnea    "when I lay down at night"    Essential hypertension 09/05/2009   Qualifier: Diagnosis of  By: Harvest Dark CMA, Jennifer     Gastric polyp    GERD 10/19/2007   Qualifier: Diagnosis of  By: Rennie Plowman RN, Blanca Friend: Diagnosis of  By: Laney Potash, Pam     GERD (gastroesophageal reflux disease)    GI bleed    a. h/o GIB on DAPT, now on ASA only.   Headache 01/03/2013   Hearing loss    left ear   History of pulmonary embolus (PE) 01/02/2011   Hyperlipemia    Hyperlipidemia, unspecified 04/24/2009   Qualifier: Diagnosis of  By: Trellis Paganini PA-c, Amy S    Hypertension    ICD-St.Jude 08/06/2011   S/p St. Jude ICD placement 08/05/11    Insomnia    Irritable bowel syndrome    Ischemic cardiomyopathy    EF 10-15%   Memory loss    Migraine headache without aura    Myocardial infarct ([place]) Oct 13, 2009 x 2   Dr. Cooper,cardiology    PERSONAL HX COLONIC POLYPS 04/24/2009  Qualifier: Diagnosis of  By: Shane Crutch, Amy S November, 2011 colonoscopy demonstrated a sessile cecal polyp and sigmoid polyp    Pneumonia    PONV (postoperative nausea and vomiting)    Pre-diabetes    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/05/2012   Pulmonary embolism ([place]) 2011   Stroke [facility])    'When I was young." no residual   Thoracic aortic aneurysm 03/31/2018   TOBACCO ABUSE 04/04/2009   Qualifier: Diagnosis of  By: Arvid Right  Qualifier: Diagnosis of  By: [name], MD, Jaquelyn Bitter    Transfusion history    ?'12 or '13   UNSPECIFIED ANEMIA 07/03/2010   Qualifier: Diagnosis of  By: Shelda Pal     Unspecified mastoiditis      FAMILY HISTORY:   Family History  Problem Relation Age of Onset   Colon cancer Mother     Other Brother        tumor in his brain   Bladder Cancer Brother    Throat cancer Brother    Cancer Son        kidney, spread to his liver   Breast cancer Cousin     SOCIAL HISTORY:   Social History   Tobacco Use   Smoking status: Former    Packs/day: 0.13    Years: 30.00    Pack years: 3.90    Types: Cigarettes   Smokeless tobacco: Never  Substance Use Topics   Alcohol use: No    Alcohol/week: 0.0 standard drinks     ALLERGIES:   Allergies  Allergen Reactions   Lisinopril     cough   Sulfa Antibiotics Other (See Comments)    Unknown, childhood allergy    Sulfonamide Derivatives Other (See Comments)    UNSURE     CURRENT MEDICATIONS:   Current Outpatient Medications  Medication Sig Dispense Refill   amitriptyline (ELAVIL) 50 MG tablet Take 1 tablet (50 mg total) by mouth at bedtime. 90 tablet 1   aspirin 81 MG tablet Take 81 mg by mouth every morning.     bisoprolol (ZEBETA) 5 MG tablet TAKE 1 TABLET BY MOUTH DAILY. 90 tablet 2   dexlansoprazole (DEXILANT) 60 MG capsule Take 1 capsule (60 mg total) by mouth daily. 30 capsule 11   famotidine (PEPCID) 20 MG tablet Take 1 tablet (20 mg total) by mouth 2 (two) times daily. Please schedule a yearly follow up for further refills. Thank you. 60 tablet 1   furosemide (LASIX) 40 MG tablet Take 1.5 tablets (60 mg total) by mouth daily. 90 tablet 3   hydrALAZINE (APRESOLINE) 10 MG tablet Take 1 tablet (10 mg total) by mouth 3 (three) times daily. 270 tablet 3   isosorbide mononitrate (IMDUR) 30 MG 24 hr tablet Take 0.5 tablets (15 mg total) by mouth daily. 45 tablet 3   polyethylene glycol powder (GLYCOLAX/MIRALAX) powder Take 17 g by mouth daily as needed for mild constipation. 255 g 11   PROAIR HFA 108 (90 Base) MCG/ACT inhaler 1 puff daily as needed. 8.5 g 4   rosuvastatin (CRESTOR) 20 MG tablet TAKE 1 TABLET (20 MG TOTAL) BY MOUTH AT BEDTIME. PLEASE MAKE YEARLY APPT WITH DR. Burt Knack FOR OCTOBER 2022 FOR FUTURE REFILLS. 90  tablet 2   topiramate (TOPAMAX) 50 MG tablet Take 1 tablet (50 mg total) by mouth daily. 90 tablet 3   nitroGLYCERIN (NITROSTAT) 0.4 MG SL tablet Place 1 tablet (0.4 mg total) under the tongue every 5 (five) minutes as needed  for chest pain. 25 tablet 11   No current facility-administered medications for this visit.    REVIEW OF SYSTEMS:   [X]  denotes positive finding, [ ]  denotes negative finding Cardiac  Comments:  Chest pain or chest pressure:    Shortness of breath upon exertion: x   Short of breath when lying flat: x   Irregular heart rhythm:        Vascular    Pain in calf, thigh, or hip brought on by ambulation:    Pain in feet at night that wakes you up from your sleep:     Blood clot in your veins:    Leg swelling:         Pulmonary    Oxygen at home:    Productive cough:     Wheezing:         Neurologic    Sudden weakness in arms or legs:     Sudden numbness in arms or legs:     Sudden onset of difficulty speaking or slurred speech:    Temporary loss of vision in one eye:     Problems with dizziness:         Gastrointestinal    Blood in stool:     Vomited blood:         Genitourinary    Burning when urinating:     Blood in urine:        Psychiatric    Major depression:         Hematologic    Bleeding problems:    Problems with blood clotting too easily:        Skin    Rashes or ulcers:        Constitutional    Fever or chills:      PHYSICAL EXAM:   Vitals:   09/23/21 1110  BP: 135/74  Pulse: 77  Resp: 20  Temp: 97.9 F (36.6 C)  SpO2: 95%  Weight: 185 lb (83.9 kg)  Height: 5\' 2"  (1.575 m)    GENERAL: The patient is a well-nourished female, in no acute distress. The vital signs are documented above. CARDIAC: There is a regular rate and rhythm.  VASCULAR: Palpable radial pulses bilaterally PULMONARY: Non-labored respirations ABDOMEN: Soft and non-tender .  MUSCULOSKELETAL: There are no major deformities or cyanosis. NEUROLOGIC: No  focal weakness or paresthesias are detected. SKIN: There are no ulcers or rashes noted. PSYCHIATRIC: The patient has a normal affect.  STUDIES:   I have reviewed her ultrasound with the following findings: Right Carotid: Velocities in the right ICA are consistent with a 1-39%  stenosis.   Left Carotid: Velocities in the left ICA are consistent with a 1-39%  stenosis.   Vertebrals:  Bilateral vertebral arteries demonstrate antegrade flow.  Subclavians: Normal flow hemodynamics were seen in bilateral subclavian               arteries.   MEDICAL ISSUES:   TAAA: Carotid subclavian transposition appears to be widely patent based off ultrasound imaging today.  Her last CT scan was approximately 2 years ago, at which time her aortic pathology appeared to have resolved.  Unfortunately, she did not come in with a repeat CT scan today.  Her imaging is without contrast because of her renal insufficiency.  I will plan on bringing her back in 1 year with repeat carotid duplex and noncontrast CT scan of her chest.  Shortness of breath: This appears to be very disabling for her.  With  her history of smoking, I am going to make a referral to pulmonology, as she states that she has never been evaluated by them.    Leia Alf, MD, FACS [facility] [phone] Pager [phone]

## 2021-09-24 ENCOUNTER — Encounter: Payer: Self-pay | Admitting: Emergency Medicine

## 2021-09-24 ENCOUNTER — Ambulatory Visit (INDEPENDENT_AMBULATORY_CARE_PROVIDER_SITE_OTHER): Payer: Medicare Other | Admitting: Emergency Medicine

## 2021-09-24 VITALS — BP 118/80 | HR 90 | Ht 62.0 in | Wt 182.0 lb

## 2021-09-24 DIAGNOSIS — M5136 Other intervertebral disc degeneration, lumbar region: Secondary | ICD-10-CM

## 2021-09-24 DIAGNOSIS — J449 Chronic obstructive pulmonary disease, unspecified: Secondary | ICD-10-CM

## 2021-09-24 DIAGNOSIS — I5022 Chronic systolic (congestive) heart failure: Secondary | ICD-10-CM

## 2021-09-24 DIAGNOSIS — N1832 Chronic kidney disease, stage 3b: Secondary | ICD-10-CM

## 2021-09-24 DIAGNOSIS — M545 Low back pain, unspecified: Secondary | ICD-10-CM | POA: Diagnosis not present

## 2021-09-24 DIAGNOSIS — G43009 Migraine without aura, not intractable, without status migrainosus: Secondary | ICD-10-CM

## 2021-09-24 DIAGNOSIS — I25119 Atherosclerotic heart disease of native coronary artery with unspecified angina pectoris: Secondary | ICD-10-CM

## 2021-09-24 DIAGNOSIS — M51369 Other intervertebral disc degeneration, lumbar region without mention of lumbar back pain or lower extremity pain: Secondary | ICD-10-CM

## 2021-09-24 DIAGNOSIS — G8929 Other chronic pain: Secondary | ICD-10-CM

## 2021-09-24 DIAGNOSIS — I10 Essential (primary) hypertension: Secondary | ICD-10-CM

## 2021-09-24 MED ORDER — TRAMADOL HCL 50 MG PO TABS: 50.0000 mg | ORAL_TABLET | Freq: Three times a day (TID) | ORAL | 1 refills | Status: AC | PRN

## 2021-09-24 MED ORDER — AMITRIPTYLINE HCL 50 MG PO TABS: 50.0000 mg | ORAL_TABLET | Freq: Every day | ORAL | 1 refills | Status: DC

## 2021-09-24 NOTE — Patient Instructions (Signed)
Chronic Back Pain When back pain lasts longer than 3 months, it is called chronic back pain. Pain may get worse at certain times (flare-ups). There are things you can do at home to manage your pain. Follow these instructions at home: Pay attention to any changes in your symptoms. Take these actions to help with your pain: Managing pain and stiffness   If told, put ice on the painful area. Your doctor may tell you to use ice for 24-48 hours after the flare-up starts. To do this: Put ice in a plastic bag. Place a towel between your skin and the bag. Leave the ice on for 20 minutes, 2-3 times a day. If told, put heat on the painful area. Do this as often as told by your doctor. Use the heat source that your doctor recommends, such as a moist heat pack or a heating pad. Place a towel between your skin and the heat source. Leave the heat on for 20-30 minutes. Take off the heat if your skin turns bright red. This is especially important if you are unable to feel pain, heat, or cold. You may have a greater risk of getting burned. Soak in a warm bath. This can help relieve pain. Activity  Avoid bending and other activities that make pain worse. When standing: Keep your upper back and neck straight. Keep your shoulders pulled back. Avoid slouching. When sitting: Keep your back straight. Relax your shoulders. Do not round your shoulders or pull them backward. Do not sit or stand in one place for long periods of time. Take short rest breaks during the day. Lying down or standing is usually better than sitting. Resting can help relieve pain. When sitting or lying down for a long time, do some mild activity or stretching. This will help to prevent stiffness and pain. Get regular exercise. Ask your doctor what activities are safe for you. Do not lift anything that is heavier than 10 lb (4.5 kg) or the limit that you are told, until your doctor says that it is safe. To prevent injury when you lift  things: Bend your knees. Keep the weight close to your body. Avoid twisting. Sleep on a firm mattress. Try lying on your side with your knees slightly bent. If you lie on your back, put a pillow under your knees. Medicines Treatment may include medicines for pain and swelling taken by mouth or put on the skin, prescription pain medicine, or muscle relaxants. Take over-the-counter and prescription medicines only as told by your doctor. Ask your doctor if the medicine prescribed to you: Requires you to avoid driving or using machinery. Can cause trouble pooping (constipation). You may need to take these actions to prevent or treat trouble pooping: Drink enough fluid to keep your pee (urine) pale yellow. Take over-the-counter or prescription medicines. Eat foods that are high in fiber. These include beans, whole grains, and fresh fruits and vegetables. Limit foods that are high in fat and sugars. These include fried or sweet foods. General instructions Do not use any products that contain nicotine or tobacco, such as cigarettes, e-cigarettes, and chewing tobacco. If you need help quitting, ask your doctor. Keep all follow-up visits as told by your doctor. This is important. Contact a doctor if: Your pain does not get better with rest or medicine. Your pain gets worse, or you have new pain. You have a high fever. You lose weight very quickly. You have trouble doing your normal activities. Get help right away if: One   or both of your legs or feet feel weak. One or both of your legs or feet lose feeling (have numbness). You have trouble controlling when you poop (have a bowel movement) or pee (urinate). You have bad back pain and: You feel like you may vomit (nauseous), or you vomit. You have pain in your belly (abdomen). You have shortness of breath. You faint. Summary When back pain lasts longer than 3 months, it is called chronic back pain. Pain may get worse at certain times  (flare-ups). Use ice and heat as told by your doctor. Your doctor may tell you to use ice after flare-ups. This information is not intended to replace advice given to you by your health care provider. Make sure you discuss any questions you have with your health care provider. Document Revised: 08/24/2019 Document Reviewed: 08/24/2019 Elsevier Patient Education  2022 [employer]  

## 2021-09-24 NOTE — Progress Notes (Signed)
[name] 76 y.o.   Chief Complaint  Patient presents with   Follow-up    Chronic conditions, pt would like to discuss back pain   Medication Refill    amitriptyline    HISTORY OF PRESENT ILLNESS: This is a 76 y.o. female here for follow-up of multiple chronic medical problems but more interested in discussing chronic lumbar pain that she has had for months.  Has history of lower back surgery in the past.  Unsuccessful physical therapy.  Localized pain worse with movement and not associated with any bowel or bladder symptoms.  Requesting something for when she has severe pain. Also requesting refill on amitriptyline. Stable cardiac conditions. Stable chronic kidney problem. Sees cardiologist on a regular basis.  Medication Refill Pertinent negatives include no abdominal pain, chest pain, chills, congestion, coughing, fever, headaches, nausea, rash, sore throat or vomiting.    Prior to Admission medications   Medication Sig Start Date End Date Taking? Authorizing Provider  aspirin 81 MG tablet Take 81 mg by mouth every morning.   Yes [provider]  bisoprolol (ZEBETA) 5 MG tablet TAKE 1 TABLET BY MOUTH DAILY. 07/12/21  Yes [name], MD  dexlansoprazole (DEXILANT) 60 MG capsule Take 1 capsule (60 mg total) by mouth daily. 02/19/21  Yes Esterwood, Amy S, PA-C  famotidine (PEPCID) 20 MG tablet Take 1 tablet (20 mg total) by mouth 2 (two) times daily. Please schedule a yearly follow up for further refills. Thank you. 01/23/20  Yes Armbruster, Carlota Raspberry, MD  furosemide (LASIX) 40 MG tablet Take 1.5 tablets (60 mg total) by mouth daily. 08/14/21 08/14/22 Yes Weaver, Scott T, PA-C  hydrALAZINE (APRESOLINE) 10 MG tablet Take 1 tablet (10 mg total) by mouth 3 (three) times daily. 08/13/21 08/13/22 Yes Weaver, Scott T, PA-C  isosorbide mononitrate (IMDUR) 30 MG 24 hr tablet Take 0.5 tablets (15 mg total) by mouth daily. 08/13/21 08/13/22 Yes Weaver, Scott T, PA-C  polyethylene  glycol powder (GLYCOLAX/MIRALAX) powder Take 17 g by mouth daily as needed for mild constipation. 07/08/17  Yes Jacelyn Pi, Lilia Argue, MD  [facility] HFA [phone] Base) MCG/ACT inhaler 1 puff daily as needed. 03/27/21  Yes Misty Rago, Ines Bloomer, MD  rosuvastatin (CRESTOR) 20 MG tablet TAKE 1 TABLET (20 MG TOTAL) BY MOUTH AT BEDTIME. PLEASE MAKE YEARLY APPT WITH DR. Burt Knack FOR OCTOBER 2022 FOR FUTURE REFILLS. 04/16/21  Yes [name], MD  topiramate (TOPAMAX) 50 MG tablet Take 1 tablet (50 mg total) by mouth daily. 11/13/20  Yes Suzzanne Cloud, NP  traMADol (ULTRAM) 50 MG tablet Take 1 tablet (50 mg total) by mouth every 8 (eight) hours as needed for up to 5 days. 09/24/21 09/29/21 Yes Aundreya Souffrant, Ines Bloomer, MD  amitriptyline (ELAVIL) 50 MG tablet Take 1 tablet (50 mg total) by mouth at bedtime. 09/24/21   Horald Pollen, MD  nitroGLYCERIN (NITROSTAT) 0.4 MG SL tablet Place 1 tablet (0.4 mg total) under the tongue every 5 (five) minutes as needed for chest pain. 03/29/19 08/09/21  Richardson Dopp T, PA-C    Allergies  Allergen Reactions   Lisinopril     cough   Sulfa Antibiotics Other (See Comments)    Unknown, childhood allergy    Sulfonamide Derivatives Other (See Comments)    UNSURE    Patient Active Problem List   Diagnosis Date Noted   Stage 3b chronic kidney disease ([place]) 08/08/2021   Chronic insomnia 11/14/2019   Colon polyp 06/16/2018   Thoracic aortic aneurysm 03/31/2018  Chronic renal insufficiency    AICD (automatic cardioverter/defibrillator) present    Chronic HFrEF (heart failure with reduced ejection fraction) (HCC)    COPD (chronic obstructive pulmonary disease) ([place])    Hyperlipemia    Insomnia    Pre-diabetes 06/19/2017   Gastric polyp    Osteoporosis 05/11/2013   Hearing loss 04/29/2013   Mild cognitive impairment 04/29/2013   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/05/2012   Implantable cardioverter-defibrillator (ICD) in situ 08/06/2011   Cardiomyopathy, ischemic  06/12/2011   DDD (degenerative disc disease), lumbar 05/16/2011   History of pulmonary embolus (PE) 01/02/2011   UNSPECIFIED ANEMIA 07/03/2010   Essential hypertension 09/05/2009   DIVERTICULOSIS-COLON 08/14/2009   PERSONAL HX COLONIC POLYPS 04/24/2009   TOBACCO ABUSE 04/04/2009   Coronary artery disease involving native coronary artery of native heart with angina pectoris ([place]) 02/25/2009   GERD 10/19/2007   IBS 10/19/2007   COLONIC POLYPS, ADENOMATOUS 05/12/2007    Past Medical History:  Diagnosis Date   Abdominal pain, left lower quadrant 08/14/2009   Qualifier: Diagnosis of  By: Laney Potash, Pam     Abnormal CT scan, stomach    Abnormal LFTs 06/19/2017   AICD (automatic cardioverter/defibrillator) present    Dr.Allred follows   Aortic arch pseudoaneurysm    a. followed by Dr. Prescott Gum.   Arthritis    Asthma    Benign neoplasm of colon    CAD (coronary artery disease) 02/2009   a. anterior STEMI rx with BMS to prox LAD in 02/2009. b. ISR s/p PTCA 06/2009. c. ISR s/p thrombectomy & PTCA 03/2010 due to late stent thrombosis. // Myoview 04/2019: EF 28, ant, ant-sept, inf-sept scar, no ischemia, high risk (stable>>cont med Rx)    Cardiomyopathy, ischemic 06/12/2011   Chronic renal insufficiency    stage 3   Chronic systolic CHF (congestive heart failure) ([place])    a. s/p ST. Jude ICD 2011/10/24.   Chronic systolic heart failure ([place]) 08/05/2011   Colon polyp    Complication of anesthesia    COPD (chronic obstructive pulmonary disease) ([place])    CORONARY ARTERY DISEASE 04/24/2009   Qualifier: Diagnosis of  By: Shane Crutch, Amy S    DDD (degenerative disc disease), lumbar 05/16/2011   Depression    "since my son died in 11-24-22" 10-23-17)   Diverticulosis    DIVERTICULOSIS-COLON 08/14/2009   Qualifier: Diagnosis of  By: Trellis Paganini PA-c, Amy S    DYSPHAGIA UNSPECIFIED 04/24/2009   Qualifier: Diagnosis of  By: Laney Potash, Pam     Dyspnea    "when I lay down at night"    Essential  hypertension 09/05/2009   Qualifier: Diagnosis of  By: Harvest Dark CMA, Jennifer     Gastric polyp    GERD 10/19/2007   Qualifier: Diagnosis of  By: Rennie Plowman RN, Blanca Friend: Diagnosis of  By: Laney Potash, Pam     GERD (gastroesophageal reflux disease)    GI bleed    a. h/o GIB on DAPT, now on ASA only.   Headache 01/03/2013   Hearing loss    left ear   History of pulmonary embolus (PE) 01/02/2011   Hyperlipemia    Hyperlipidemia, unspecified 04/24/2009   Qualifier: Diagnosis of  By: Trellis Paganini PA-c, Amy S    Hypertension    ICD-St.Jude 08/06/2011   S/p St. Jude ICD placement 08/05/11    Insomnia    Irritable bowel syndrome    Ischemic cardiomyopathy    EF 10-15%   Memory loss  Migraine headache without aura    Myocardial infarct ([place]) 2011 x 2   Dr. Roswell Nickel    PERSONAL HX COLONIC POLYPS 04/24/2009   Qualifier: Diagnosis of  By: Shane Crutch, Amy S November, 2011 colonoscopy demonstrated a sessile cecal polyp and sigmoid polyp    Pneumonia    PONV (postoperative nausea and vomiting)    Pre-diabetes    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/05/2012   Pulmonary embolism ([place]) 2011   Stroke [facility])    'When I was young." no residual   Thoracic aortic aneurysm 03/31/2018   TOBACCO ABUSE 04/04/2009   Qualifier: Diagnosis of  By: Arvid Right  Qualifier: Diagnosis of  By: [name], MD, Jaquelyn Bitter    Transfusion history    ?'12 or '13   UNSPECIFIED ANEMIA 07/03/2010   Qualifier: Diagnosis of  By: Shelda Pal     Unspecified mastoiditis     Past Surgical History:  Procedure Laterality Date   ANGIOPLASTY  07/02/09, 04/01/10   BILATERAL SALPINGOOPHORECTOMY     CAD( bare metal stent)  02/2009   x 1   CAROTID-SUBCLAVIAN BYPASS GRAFT Left 03/31/2018   Procedure: LEFT SUBCLAVIAN ARTERY BYPASS GRAFT;  Surgeon: Serafina Mitchell, MD;  Location: [facility] OR;  Service: Vascular;  Laterality: Left;   CERVICAL SPINE SURGERY  08/08   COLONOSCOPY WITH PROPOFOL N/A 04/29/2016    Procedure: COLONOSCOPY WITH PROPOFOL;  Surgeon: [name], MD;  Location: [facility] ENDOSCOPY;  Service: Gastroenterology;  Laterality: N/A;   ESOPHAGOGASTRODUODENOSCOPY N/A 12/09/2016   Procedure: ESOPHAGOGASTRODUODENOSCOPY (EGD);  Surgeon: [name], MD;  Location: [facility];  Service: Gastroenterology;  Laterality: N/A;   ESOPHAGOGASTRODUODENOSCOPY (EGD) WITH PROPOFOL N/A 04/29/2016   Procedure: ESOPHAGOGASTRODUODENOSCOPY (EGD) WITH PROPOFOL;  Surgeon: [name], MD;  Location: [facility] ENDOSCOPY;  Service: Gastroenterology;  Laterality: N/A;   EUS N/A 05/15/2016   Procedure: UPPER ENDOSCOPIC ULTRASOUND (EUS) RADIAL;  Surgeon: Milus Banister, MD;  Location: [facility] ENDOSCOPY;  Service: Endoscopy;  Laterality: N/A;   IMPLANTABLE CARDIOVERTER DEFIBRILLATOR IMPLANT N/A 08/05/2011   Primary prevention SJM ICD implanted,  Analyze ST study patient   INNER EAR SURGERY     left x 17   LUMBAR DISC SURGERY  02/2008   fusion   NASAL SEPTUM SURGERY     THORACIC AORTIC ENDOVASCULAR STENT GRAFT N/A 03/31/2018   Procedure: THORACIC AORTIC ENDOVASCULAR STENT GRAFT;  Surgeon: Serafina Mitchell, MD;  Location: [facility] OR;  Service: Vascular;  Laterality: N/A;   TOTAL ABDOMINAL HYSTERECTOMY     complete    Social History   Socioeconomic History   Marital status: Divorced    Spouse name: Not on file   Number of children: 2   Years of education: 11   Highest education level: Not on file  Occupational History   Occupation: Unemployed    Employer: UNEMPLOYED    Employer: DISABLED  Tobacco Use   Smoking status: Former    Packs/day: 0.13    Years: 30.00    Pack years: 3.90    Types: Cigarettes   Smokeless tobacco: Never  Vaping Use   Vaping Use: Never used  Substance and Sexual Activity   Alcohol use: No    Alcohol/week: 0.0 standard drinks   Drug use: No   Sexual activity: Not on file  Other Topics Concern   Not on file  Social History Narrative   Pt lives in [place]  alone.  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   Patient has 11  th grade education.Right handed.   Caffeine- one cup daily         Social Determinants of Health   Financial Resource Strain: Not on file  Food Insecurity: Not on file  Transportation Needs: Not on file  Physical Activity: Not on file  Stress: Not on file  Social Connections: Not on file  Intimate Partner Violence: Not on file    Family History  Problem Relation Age of Onset   Colon cancer Mother    Other Brother        tumor in his brain   Bladder Cancer Brother    Throat cancer Brother    Cancer Son        kidney, spread to his liver   Breast cancer Cousin      Review of Systems  Constitutional: Negative.  Negative for chills and fever.  HENT: Negative.  Negative for congestion and sore throat.   Respiratory: Negative.  Negative for cough and shortness of breath.   Cardiovascular: Negative.  Negative for chest pain and palpitations.  Gastrointestinal:  Negative for abdominal pain, diarrhea, nausea and vomiting.  Genitourinary: Negative.   Musculoskeletal:  Positive for back pain.  Skin: Negative.  Negative for rash.  Neurological:  Negative for dizziness, sensory change, focal weakness and headaches.  All other systems reviewed and are negative. Today's Vitals   09/24/21 1356  BP: 118/80  Pulse: 90  SpO2: 97%  Weight: 182 lb (82.6 kg)  Height: 5\' 2"  (1.575 m)   Body mass index is 33.29 kg/m.   Physical Exam Vitals reviewed.  Constitutional:      Appearance: Normal appearance.  HENT:     Head: Normocephalic.  Eyes:     Extraocular Movements: Extraocular movements intact.     Pupils: Pupils are equal, round, and reactive to light.  Cardiovascular:     Rate and Rhythm: Normal rate and regular rhythm.     Pulses: Normal pulses.     Heart sounds: Normal heart sounds.  Pulmonary:     Effort: Pulmonary effort is normal.     Breath sounds: Normal breath sounds.  Abdominal:      Palpations: Abdomen is soft.     Tenderness: There is no abdominal tenderness.  Musculoskeletal:     Cervical back: No tenderness.     Lumbar back: Spasms and tenderness present. No bony tenderness. Decreased range of motion.     Right lower leg: No edema.     Left lower leg: No edema.  Lymphadenopathy:     Cervical: No cervical adenopathy.  Skin:    General: Skin is warm and dry.     Capillary Refill: Capillary refill takes less than 2 seconds.  Neurological:     General: No focal deficit present.     Mental Status: She is alert and oriented to person, place, and time.  Psychiatric:        Mood and Affect: Mood normal.        Behavior: Behavior normal.     ASSESSMENT & PLAN: Problem List Items Addressed This Visit       Cardiovascular and Mediastinum   Essential hypertension    Well-controlled. BP Readings from Last 3 Encounters:  09/24/21 118/80  09/23/21 135/74  08/09/21 132/70  Continue present medications.  No changes.       Coronary artery disease involving native coronary artery of native heart with angina pectoris (HCC)    Stable.  No recent anginal episodes.  Chronic HFrEF (heart failure with reduced ejection fraction) (HCC)    Stable.  No signs of acute CHF.  Normovolemic.        Respiratory   COPD (chronic obstructive pulmonary disease) (HCC)    Stable.  No recent use of albuterol inhaler.        Musculoskeletal and Integument   DDD (degenerative disc disease), lumbar    Creating chronic lumbar pain and affecting quality of life. May benefit from tramadol as needed.      Relevant Medications   traMADol (ULTRAM) 50 MG tablet     Genitourinary   Stage 3b chronic kidney disease (HCC)    Stable.  Advised to avoid NSAIDs.      Other Visit Diagnoses     Chronic bilateral low back pain without sciatica    -  Primary   Relevant Medications   traMADol (ULTRAM) 50 MG tablet   amitriptyline (ELAVIL) 50 MG tablet   Chronic systolic CHF  (congestive heart failure) (HCC)       Migraine without aura and without status migrainosus, not intractable       Relevant Medications   traMADol (ULTRAM) 50 MG tablet   amitriptyline (ELAVIL) 50 MG tablet      Patient Instructions  Chronic Back Pain When back pain lasts longer than 3 months, it is called chronic back pain. Pain may get worse at certain times (flare-ups). There are things you can do at home to manage your pain. Follow these instructions at home: Pay attention to any changes in your symptoms. Take these actions to help with your pain: Managing pain and stiffness   If told, put ice on the painful area. Your doctor may tell you to use ice for 24-48 hours after the flare-up starts. To do this: Put ice in a plastic bag. Place a towel between your skin and the bag. Leave the ice on for 20 minutes, 2-3 times a day. If told, put heat on the painful area. Do this as often as told by your doctor. Use the heat source that your doctor recommends, such as a moist heat pack or a heating pad. Place a towel between your skin and the heat source. Leave the heat on for 20-30 minutes. Take off the heat if your skin turns bright red. This is especially important if you are unable to feel pain, heat, or cold. You may have a greater risk of getting burned. Soak in a warm bath. This can help relieve pain. Activity  Avoid bending and other activities that make pain worse. When standing: Keep your upper back and neck straight. Keep your shoulders pulled back. Avoid slouching. When sitting: Keep your back straight. Relax your shoulders. Do not round your shoulders or pull them backward. Do not sit or stand in one place for long periods of time. Take short rest breaks during the day. Lying down or standing is usually better than sitting. Resting can help relieve pain. When sitting or lying down for a long time, do some mild activity or stretching. This will help to prevent stiffness and  pain. Get regular exercise. Ask your doctor what activities are safe for you. Do not lift anything that is heavier than 10 lb (4.5 kg) or the limit that you are told, until your doctor says that it is safe. To prevent injury when you lift things: Bend your knees. Keep the weight close to your body. Avoid twisting. Sleep on a firm mattress. Try lying on your side with  your knees slightly bent. If you lie on your back, put a pillow under your knees. Medicines Treatment may include medicines for pain and swelling taken by mouth or put on the skin, prescription pain medicine, or muscle relaxants. Take over-the-counter and prescription medicines only as told by your doctor. Ask your doctor if the medicine prescribed to you: Requires you to avoid driving or using machinery. Can cause trouble pooping (constipation). You may need to take these actions to prevent or treat trouble pooping: Drink enough fluid to keep your pee (urine) pale yellow. Take over-the-counter or prescription medicines. Eat foods that are high in fiber. These include beans, whole grains, and fresh fruits and vegetables. Limit foods that are high in fat and sugars. These include fried or sweet foods. General instructions Do not use any products that contain nicotine or tobacco, such as cigarettes, e-cigarettes, and chewing tobacco. If you need help quitting, ask your doctor. Keep all follow-up visits as told by your doctor. This is important. Contact a doctor if: Your pain does not get better with rest or medicine. Your pain gets worse, or you have new pain. You have a high fever. You lose weight very quickly. You have trouble doing your normal activities. Get help right away if: One or both of your legs or feet feel weak. One or both of your legs or feet lose feeling (have numbness). You have trouble controlling when you poop (have a bowel movement) or pee (urinate). You have bad back pain and: You feel like you may  vomit (nauseous), or you vomit. You have pain in your belly (abdomen). You have shortness of breath. You faint. Summary When back pain lasts longer than 3 months, it is called chronic back pain. Pain may get worse at certain times (flare-ups). Use ice and heat as told by your doctor. Your doctor may tell you to use ice after flare-ups. This information is not intended to replace advice given to you by your health care provider. Make sure you discuss any questions you have with your health care provider. Document Revised: 08/24/2019 Document Reviewed: 08/24/2019 Elsevier Patient Education  2022 [place], MD [facility]

## 2021-09-24 NOTE — Assessment & Plan Note (Signed)
Stable.  Advised to avoid NSAIDs.

## 2021-09-24 NOTE — Assessment & Plan Note (Signed)
Stable.  No recent use of albuterol inhaler.

## 2021-09-24 NOTE — Assessment & Plan Note (Signed)
Well-controlled. BP Readings from Last 3 Encounters:  09/24/21 118/80  09/23/21 135/74  08/09/21 132/70  Continue present medications.  No changes.

## 2021-09-24 NOTE — Assessment & Plan Note (Signed)
Stable.  No signs of acute CHF.  Normovolemic.

## 2021-09-24 NOTE — Assessment & Plan Note (Signed)
Creating chronic lumbar pain and affecting quality of life. May benefit from tramadol as needed.

## 2021-09-24 NOTE — Assessment & Plan Note (Signed)
Stable.  No recent anginal episodes. 

## 2021-09-26 ENCOUNTER — Telehealth: Payer: Self-pay | Admitting: *Deleted

## 2021-10-03 ENCOUNTER — Ambulatory Visit (INDEPENDENT_AMBULATORY_CARE_PROVIDER_SITE_OTHER): Payer: Medicare Other

## 2021-10-03 DIAGNOSIS — I255 Ischemic cardiomyopathy: Secondary | ICD-10-CM | POA: Diagnosis not present

## 2021-10-03 LAB — CUP PACEART REMOTE DEVICE CHECK
Battery Remaining Longevity: 14 mo
Battery Remaining Percentage: 12 %
Battery Voltage: 2.71 V
Brady Statistic RV Percent Paced: 1 %
Date Time Interrogation Session: 20230309030513
HighPow Impedance: 98 Ohm
HighPow Impedance: 98 Ohm
Implantable Lead Implant Date: 20130108
Implantable Lead Location: 753860
Implantable Pulse Generator Implant Date: 20130108
Lead Channel Impedance Value: 530 Ohm
Lead Channel Pacing Threshold Amplitude: 0.75 V
Lead Channel Pacing Threshold Pulse Width: 0.5 ms
Lead Channel Sensing Intrinsic Amplitude: 12 mV
Lead Channel Setting Pacing Amplitude: 2.5 V
Lead Channel Setting Pacing Pulse Width: 0.5 ms
Lead Channel Setting Sensing Sensitivity: 0.5 mV
Pulse Gen Serial Number: 1016523

## 2021-10-14 ENCOUNTER — Encounter: Payer: Self-pay | Admitting: *Deleted

## 2021-10-14 DIAGNOSIS — Z006 Encounter for examination for normal comparison and control in clinical research program: Secondary | ICD-10-CM

## 2021-10-15 ENCOUNTER — Telehealth: Payer: Self-pay | Admitting: Physician Assistant

## 2021-10-15 NOTE — Telephone Encounter (Signed)
Reviewed w/ Nicki Reaper, no orders received. ?

## 2021-10-15 NOTE — Telephone Encounter (Signed)
-----   Message from Patton Salles, RN sent at 10/14/2021  4:25 PM EDT ----- ?Regarding: short of breath ?Brigid Re,  ?I just spoke with Ms Oconnor. She is pretty short of breath. She says her weight is 186, little swelling in her lower extremity, and none that she can notice in her abdomen. She said her right arm is swollen pretty bad- I asked her press on it to see if it left an indention and she said not really. She said that she has since quit smoking and don't want another cigarette. She states that any exertion  she is very short of breath and she just huff and puffs.  ?Do you think you could get her in any earlier to see you than May. Maybe one of your partners.  ?Thanks  ?Joelene Millin :)  ? ?

## 2021-10-15 NOTE — Telephone Encounter (Signed)
Please call patient today to discuss symptoms to help guide disposition. ?Richardson Dopp, PA-C    ?10/15/2021 7:28 AM   ?

## 2021-10-15 NOTE — Telephone Encounter (Signed)
Patient's brother returned call.  ?

## 2021-10-15 NOTE — Telephone Encounter (Signed)
Unable to reach pt, says "your call cannot be completed as dialed". ?Contacted son, he verifies that we have correct pt number on file. ?He will contact pt and ask her to call our office. ?

## 2021-10-15 NOTE — Telephone Encounter (Signed)
Returned call to brother, pt gave verbal permission to speak with him.Marland Kitchen ?She report her SOB is unchanged from when she was seen in January, has not worsened. ?The swelling in her right arm is not new and unchanged, states it has been this way for many years. ?Claims she lost 4 pds ? ?Pt made aware that will review w/ Richardson Dopp, PA. ?Aware that sounds like she should continue monitoring and call us if anything changes/worsens. ?Aware that will only call back if orders/recommendation given. ?Brother & pt verbalized understanding and agreeable to plan.  ? ? ? ?

## 2021-10-16 NOTE — Progress Notes (Signed)
Remote ICD transmission.   

## 2021-10-24 NOTE — Telephone Encounter (Signed)
Spoke with patient see research encounter  ?

## 2021-10-24 NOTE — Research (Signed)
Called patient to let her know the Beat HF study is ending.  ?Patient was pretty short of breath on the phone. She says her weight is 186, little swelling in her lower extremity, and none that she can notice in her abdomen. She said her right arm is swollen pretty bad- I asked her press on it to see if it left an indention and she said not really. She said that she has since quit smoking and don't want another cigarette. She states that any exertion  she is very short of breath and she just huff and puffs.  ? ?I told her that I would let Richardson Dopp know so he could assess and see if she needed to come in for an appt.  ?See phone note from his clinic.  ?No changes in her from last visit with patient.  ? ? ?

## 2021-11-08 ENCOUNTER — Encounter: Payer: Self-pay | Admitting: *Deleted

## 2021-11-08 DIAGNOSIS — Z006 Encounter for examination for normal comparison and control in clinical research program: Secondary | ICD-10-CM

## 2021-11-08 NOTE — Research (Signed)
EOS visit for Beat HF  ? ?Patient doing ok. Going to see a lung specialist next week, about her shortness of breath.  ?Explained that the study was over. I asked her about implant of barostim and she said she wanted to wait and get her lungs checked first.  ?I explained that if she wanted to precede with barostim at a later date she can just let us know.  ?Thanked her for participating in the study.  ?Meds reviewed with patient.  ? ? ? ?Current Outpatient Medications:  ?  amitriptyline (ELAVIL) 50 MG tablet, Take 1 tablet (50 mg total) by mouth at bedtime., Disp: 90 tablet, Rfl: 1 ?  aspirin 81 MG tablet, Take 81 mg by mouth every morning., Disp: , Rfl:  ?  bisoprolol (ZEBETA) 5 MG tablet, TAKE 1 TABLET BY MOUTH DAILY., Disp: 90 tablet, Rfl: 2 ?  dexlansoprazole (DEXILANT) 60 MG capsule, Take 1 capsule (60 mg total) by mouth daily., Disp: 30 capsule, Rfl: 11 ?  famotidine (PEPCID) 20 MG tablet, Take 1 tablet (20 mg total) by mouth 2 (two) times daily. Please schedule a yearly follow up for further refills. Thank you., Disp: 60 tablet, Rfl: 1 ?  furosemide (LASIX) 40 MG tablet, Take 1.5 tablets (60 mg total) by mouth daily., Disp: 90 tablet, Rfl: 3 ?  hydrALAZINE (APRESOLINE) 10 MG tablet, Take 1 tablet (10 mg total) by mouth 3 (three) times daily., Disp: 270 tablet, Rfl: 3 ?  isosorbide mononitrate (IMDUR) 30 MG 24 hr tablet, Take 0.5 tablets (15 mg total) by mouth daily., Disp: 45 tablet, Rfl: 3 ?  polyethylene glycol powder (GLYCOLAX/MIRALAX) powder, Take 17 g by mouth daily as needed for mild constipation., Disp: 255 g, Rfl: 11 ?  PROAIR HFA 108 (90 Base) MCG/ACT inhaler, 1 puff daily as needed., Disp: 8.5 g, Rfl: 4 ?  rosuvastatin (CRESTOR) 20 MG tablet, TAKE 1 TABLET (20 MG TOTAL) BY MOUTH AT BEDTIME. PLEASE MAKE YEARLY APPT WITH DR. Burt Knack FOR OCTOBER 2022 FOR FUTURE REFILLS., Disp: 90 tablet, Rfl: 2 ?  topiramate (TOPAMAX) 50 MG tablet, Take 1 tablet (50 mg total) by mouth daily., Disp: 90 tablet, Rfl: 3 ?   nitroGLYCERIN (NITROSTAT) 0.4 MG SL tablet, Place 1 tablet (0.4 mg total) under the tongue every 5 (five) minutes as needed for chest pain., Disp: 25 tablet, Rfl: 11 ? ?

## 2021-11-12 NOTE — Research (Signed)
Late entry:   29M visit was conducted by phone due COVID-19  Patient doing ok, no med changes, and no AE at this time.   Covid questionnaire completed over the phone.   Will call patient back in [facility]

## 2021-11-13 ENCOUNTER — Ambulatory Visit (INDEPENDENT_AMBULATORY_CARE_PROVIDER_SITE_OTHER): Payer: Medicare Other | Admitting: Neurology

## 2021-11-13 VITALS — BP 134/83 | HR 99 | Ht 62.0 in | Wt 180.0 lb

## 2021-11-13 DIAGNOSIS — G43009 Migraine without aura, not intractable, without status migrainosus: Secondary | ICD-10-CM | POA: Diagnosis not present

## 2021-11-13 MED ORDER — TOPIRAMATE 50 MG PO TABS: 50.0000 mg | ORAL_TABLET | Freq: Every day | ORAL | 3 refills | Status: DC

## 2021-11-13 NOTE — Progress Notes (Signed)
? ?[facility] ? ?PATIENT: [name] ?DOB: [birth year] ? ?HISTORY OF PRESENT ILLNESS: ?Meeah is a 76 year old right-handed white divorced female with  a history of headaches and essential tremor. Patient of Dr. Erling Cruz,  ?She  has history of multiple operations on the left mastoid between the ages of 59 and 22. She is currently on disability for that reason. She has CAD, PE, she has  congestive heart failure, COPD, irritable bowel syndrome, left ankle fracture, hyperlipidemia,  acute renal failure with stage III chronic renal disease, metabolic acidosis from Topamax, pulmonary embolism 03/2010, and a defibrillator was placed 08/2011.  ?She has history of migraine since age 70s, she complains of vertex area pressure headache, she has 2-6 headaches each month, She is taking amitriptyline '50mg'$  qhs, which has been helpful. ?She took overcounter medication for nause, as needed, her headache can last all day long, ASA make her stomach hurt,  ?She brought her headache diarrhea, she had 4-6 headache each month, ? ?UPDATE April 2021: ?She was seen by nurse practitioner Hoyle Sauer over the past few years, last visit was in January 2020, overall her migraine is under good control with current Topamax 50 mg every night, she did have a history of metabolic acidosis presumably from Topamax, but she did not want to make any changes at this point, ? ?She is also taking Elavil 50 mg every night, she reported this is from her IBS, which has helped her upset stomach in the past, but she complains of chronic constipation, again she does not want to make any medication changes ? ?Her migraine is overall under good control, only has occasionally flareup, but in March 2021, she experienced daily headache for 2 weeks, she has take frequent Tylenol, up to 10 to 12 tablets each day, her headache is gone now, she reported rarely taking Tylenol if she does not have a headache ? ?She also complains of difficulty sleeping,  frequent awakening at nighttime, ? ?Update November 13, 2020 SS: Here today alone somewhat limited historian, remains on Topamax 50 mg daily, amitriptyline 50 mg daily.  Difficulty quantifying migraine frequency, claims since December, 1 episode a month of migraine that can last 2 to 3 days.  During that time, takes Tylenol every 4-6 hours.  Takes quite frequent Tylenol, for other ailments, low back pain, saw neurosurgery, reports arthritis.  No longer on clonazepam, did not take Fioricet.  Has not wanted to adjust medications. ? ?Update November 13, 2021 SS: Here today alone, remains on Topamax 50 mg daily, amitriptyline 50 mg daily. Headaches doing well, 1 a month. Pressure as pressure to vertex. Will lay down, it helps. Takes tylenol for back pain, not daily. Lives alone, drives a car. Not as active due to breathing issues, seeing pulmonologist coming up first visit.  ?  ?REVIEW OF SYSTEMS: Full 14 system review of systems performed and notable only for those listed, all others are neg:  ? ?See HPI ? ?ALLERGIES: ?Allergies  ?Allergen Reactions  ? Lisinopril   ?  cough  ? Sulfa Antibiotics Other (See Comments)  ?  Unknown, childhood allergy   ? Sulfonamide Derivatives Other (See Comments)  ?  UNSURE  ? ? ?HOME MEDICATIONS: ?Outpatient Medications Prior to Visit  ?Medication Sig Dispense Refill  ? amitriptyline (ELAVIL) 50 MG tablet Take 1 tablet (50 mg total) by mouth at bedtime. 90 tablet 1  ? aspirin 81 MG tablet Take 81 mg by mouth every morning.    ? bisoprolol (ZEBETA) 5 MG  tablet TAKE 1 TABLET BY MOUTH DAILY. 90 tablet 2  ? dexlansoprazole (DEXILANT) 60 MG capsule Take 1 capsule (60 mg total) by mouth daily. 30 capsule 11  ? famotidine (PEPCID) 20 MG tablet Take 1 tablet (20 mg total) by mouth 2 (two) times daily. Please schedule a yearly follow up for further refills. Thank you. 60 tablet 1  ? furosemide (LASIX) 40 MG tablet Take 1.5 tablets (60 mg total) by mouth daily. 90 tablet 3  ? hydrALAZINE (APRESOLINE) 10  MG tablet Take 1 tablet (10 mg total) by mouth 3 (three) times daily. 270 tablet 3  ? isosorbide mononitrate (IMDUR) 30 MG 24 hr tablet Take 0.5 tablets (15 mg total) by mouth daily. 45 tablet 3  ? polyethylene glycol powder (GLYCOLAX/MIRALAX) powder Take 17 g by mouth daily as needed for mild constipation. 255 g 11  ? PROAIR HFA 108 (90 Base) MCG/ACT inhaler 1 puff daily as needed. 8.5 g 4  ? rosuvastatin (CRESTOR) 20 MG tablet TAKE 1 TABLET (20 MG TOTAL) BY MOUTH AT BEDTIME. PLEASE MAKE YEARLY APPT WITH DR. Burt Knack FOR OCTOBER 2022 FOR FUTURE REFILLS. 90 tablet 2  ? topiramate (TOPAMAX) 50 MG tablet Take 1 tablet (50 mg total) by mouth daily. 90 tablet 3  ? nitroGLYCERIN (NITROSTAT) 0.4 MG SL tablet Place 1 tablet (0.4 mg total) under the tongue every 5 (five) minutes as needed for chest pain. 25 tablet 11  ? ?No facility-administered medications prior to visit.  ? ? ?PAST MEDICAL HISTORY: ?Past Medical History:  ?Diagnosis Date  ? Abdominal pain, left lower quadrant 08/14/2009  ? Qualifier: Diagnosis of  By: Laney Potash, Pam    ? Abnormal CT scan, stomach   ? Abnormal LFTs 06/19/2017  ? AICD (automatic cardioverter/defibrillator) present   ? Dr.Allred follows  ? Aortic arch pseudoaneurysm   ? a. followed by Dr. Prescott Gum.  ? Arthritis   ? Asthma   ? Benign neoplasm of colon   ? CAD (coronary artery disease) 02/2009  ? a. anterior STEMI rx with BMS to prox LAD in 02/2009. b. ISR s/p PTCA 06/2009. c. ISR s/p thrombectomy & PTCA 03/2010 due to late stent thrombosis. // Myoview 04/2019: EF 28, ant, ant-sept, inf-sept scar, no ischemia, high risk (stable>>cont med Rx)   ? Cardiomyopathy, ischemic 06/12/2011  ? Chronic renal insufficiency   ? stage 3  ? Chronic systolic CHF (congestive heart failure) ([place])   ? a. s/p ST. Jude ICD 10-14-11.  ? Chronic systolic heart failure ([place]) 08/05/2011  ? Colon polyp   ? Complication of anesthesia   ? COPD (chronic obstructive pulmonary disease) ([place])   ? CORONARY ARTERY DISEASE 04/24/2009   ? Qualifier: Diagnosis of  By: Shane Crutch, Amy S   ? DDD (degenerative disc disease), lumbar 05/16/2011  ? Depression   ? "since my son died in 11-14-22" 10/13/2017)  ? Diverticulosis   ? DIVERTICULOSIS-COLON 08/14/2009  ? Qualifier: Diagnosis of  By: Shane Crutch, Amy S   ? DYSPHAGIA UNSPECIFIED 04/24/2009  ? Qualifier: Diagnosis of  By: Laney Potash, Pam    ? Dyspnea   ? "when I lay down at night"   ? Essential hypertension 09/05/2009  ? Qualifier: Diagnosis of  By: Harvest Dark CMA, Anderson Malta    ? Gastric polyp   ? GERD 10/19/2007  ? Qualifier: Diagnosis of  By: Rennie Plowman RN, Blanca Friend: Diagnosis of  By: Laney Potash, Pam    ? GERD (gastroesophageal reflux disease)   ? GI  bleed   ? a. h/o GIB on DAPT, now on ASA only.  ? Headache 01/03/2013  ? Hearing loss   ? left ear  ? History of pulmonary embolus (PE) 01/02/2011  ? Hyperlipemia   ? Hyperlipidemia, unspecified 04/24/2009  ? Qualifier: Diagnosis of  By: Shane Crutch, Amy S   ? Hypertension   ? ICD-St.Jude 08/06/2011  ? S/p St. Jude ICD placement 08/05/11   ? Insomnia   ? Irritable bowel syndrome   ? Ischemic cardiomyopathy   ? EF 10-15%  ? Memory loss   ? Migraine headache without aura   ? Myocardial infarct ([place]) 2011 x 2  ? Dr. Cooper,cardiology   ? PERSONAL HX COLONIC POLYPS 04/24/2009  ? Qualifier: Diagnosis of  By: Shane Crutch, Amy S November, 2011 colonoscopy demonstrated a sessile cecal polyp and sigmoid polyp   ? Pneumonia   ? PONV (postoperative nausea and vomiting)   ? Pre-diabetes   ? 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 Pulmonary embolism ([place]) 2011  ? Stroke [facility])   ? 'When I was young." no residual  ? Thoracic aortic aneurysm 03/31/2018  ? TOBACCO ABUSE 04/04/2009  ? Qualifier: Diagnosis of  By: Arvid Right  Qualifier: Diagnosis of  By: [name] MD, Jaquelyn Bitter   ? Transfusion history   ? ?'12 or '13  ? UNSPECIFIED ANEMIA 07/03/2010  ? Qualifier: Diagnosis of  By: Shelda Pal    ? Unspecified mastoiditis   ? ? ?PAST SURGICAL  HISTORY: ?Past Surgical History:  ?Procedure Laterality Date  ? ANGIOPLASTY  07/02/09, 04/01/10  ? BILATERAL SALPINGOOPHORECTOMY    ? CAD( bare metal stent)  02/2009  ? x 1  ? CAROTID-SUBCLAVIAN BYPASS GRAFT [place] Comment

## 2021-11-13 NOTE — Patient Instructions (Signed)
Continue the Topamax for headache prevention, could consider reducing the dose in the future ?Continue to see your primary care doctor ?See you back as needed  ?

## 2021-11-21 ENCOUNTER — Ambulatory Visit (INDEPENDENT_AMBULATORY_CARE_PROVIDER_SITE_OTHER): Payer: Medicare Other

## 2021-11-21 ENCOUNTER — Encounter: Payer: Self-pay | Admitting: Emergency Medicine

## 2021-11-21 ENCOUNTER — Institutional Professional Consult (permissible substitution): Payer: Medicaid Other | Admitting: Pulmonary Disease

## 2021-11-21 ENCOUNTER — Ambulatory Visit (INDEPENDENT_AMBULATORY_CARE_PROVIDER_SITE_OTHER): Payer: Medicare Other | Admitting: Emergency Medicine

## 2021-11-21 VITALS — BP 140/82 | HR 82 | Temp 98.2°F | Ht 62.0 in | Wt 177.5 lb

## 2021-11-21 DIAGNOSIS — R1084 Generalized abdominal pain: Secondary | ICD-10-CM

## 2021-11-21 DIAGNOSIS — R300 Dysuria: Secondary | ICD-10-CM | POA: Diagnosis not present

## 2021-11-21 DIAGNOSIS — N184 Chronic kidney disease, stage 4 (severe): Secondary | ICD-10-CM | POA: Diagnosis not present

## 2021-11-21 DIAGNOSIS — R339 Retention of urine, unspecified: Secondary | ICD-10-CM

## 2021-11-21 LAB — COMPREHENSIVE METABOLIC PANEL
ALT: 15 U/L (ref 0–35)
AST: 17 U/L (ref 0–37)
Albumin: 4.1 g/dL (ref 3.5–5.2)
Alkaline Phosphatase: 102 U/L (ref 39–117)
BUN: 26 mg/dL — ABNORMAL HIGH (ref 6–23)
CO2: 26 mEq/L (ref 19–32)
Calcium: 9.1 mg/dL (ref 8.4–10.5)
Chloride: 105 mEq/L (ref 96–112)
Creatinine, Ser: 1.85 mg/dL — ABNORMAL HIGH (ref 0.40–1.20)
GFR: 26.57 mL/min — ABNORMAL LOW (ref >60.00)
Glucose, Bld: 113 mg/dL — ABNORMAL HIGH (ref 70–99)
Potassium: 3.9 mEq/L (ref 3.5–5.1)
Sodium: 138 mEq/L (ref 135–145)
Total Bilirubin: 0.4 mg/dL (ref 0.2–1.2)
Total Protein: 7 g/dL (ref 6.0–8.3)

## 2021-11-21 LAB — POCT URINALYSIS DIPSTICK
Bilirubin, UA: NEGATIVE
Blood, UA: NEGATIVE
Glucose, UA: NEGATIVE
Ketones, UA: NEGATIVE
Leukocytes, UA: NEGATIVE
Nitrite, UA: NEGATIVE
Protein, UA: NEGATIVE
Spec Grav, UA: >=1.03 — AB (ref 1.010–1.025)
Urobilinogen, UA: 0.2 E.U./dL
pH, UA: 6 (ref 5.0–8.0)

## 2021-11-21 LAB — CBC WITH DIFFERENTIAL/PLATELET
Basophils Absolute: 0 10*3/uL (ref 0.0–0.1)
Basophils Relative: 0.3 % (ref 0.0–3.0)
Eosinophils Absolute: 0.2 10*3/uL (ref 0.0–0.7)
Eosinophils Relative: 2.3 % (ref 0.0–5.0)
HCT: 31.4 % — ABNORMAL LOW (ref 36.0–46.0)
Hemoglobin: 10.5 g/dL — ABNORMAL LOW (ref 12.0–15.0)
Lymphocytes Relative: 43.1 % (ref 12.0–46.0)
Lymphs Abs: 4 10*3/uL (ref 0.7–4.0)
MCHC: 33.3 g/dL (ref 30.0–36.0)
MCV: 99.6 fl (ref 78.0–100.0)
Monocytes Absolute: 0.7 10*3/uL (ref 0.1–1.0)
Monocytes Relative: 7.7 % (ref 3.0–12.0)
Neutro Abs: 4.3 10*3/uL (ref 1.4–7.7)
Neutrophils Relative %: 46.6 % (ref 43.0–77.0)
Platelets: 249 10*3/uL (ref 150.0–400.0)
RBC: 3.15 Mil/uL — ABNORMAL LOW (ref 3.87–5.11)
RDW: 15 % (ref 11.5–15.5)
WBC: 9.2 10*3/uL (ref 4.0–10.5)

## 2021-11-21 LAB — LIPASE: Lipase: 33 U/L (ref 11.0–59.0)

## 2021-11-21 MED ORDER — DICYCLOMINE HCL 10 MG PO CAPS: 10.0000 mg | ORAL_CAPSULE | Freq: Four times a day (QID) | ORAL | 1 refills | Status: DC | PRN

## 2021-11-21 NOTE — Progress Notes (Signed)
[name] ?76 y.o. ? ? ?Chief Complaint  ?Patient presents with  ? Abdominal Pain  ? Urinary Retention  ?  X 2 days  ? Dysuria  ? Nausea  ?  Nausea and ab pain x 3 days   ? ? ?HISTORY OF PRESENT ILLNESS: ?This is a 76 y.o. female complaining of chronic upper abdominal pain followed by lower abdominal pain with nausea and urinary discomfort for the past 2 days.  Able to eat and drink.  Denies vomiting.  Has some chronic constipation issues.  Denies fever or chills. ?Denies any other associated symptomatology. ?No other complaints or medical concerns today. ? ?HPI ? ? ?Prior to Admission medications   ?Medication Sig Start Date End Date Taking? Authorizing Provider  ?amitriptyline (ELAVIL) 50 MG tablet Take 1 tablet (50 mg total) by mouth at bedtime. 09/24/21  Yes SagardiaInes Bloomer, MD  ?aspirin 81 MG tablet Take 81 mg by mouth every morning.   Yes [provider]  ?bisoprolol (ZEBETA) 5 MG tablet TAKE 1 TABLET BY MOUTH DAILY. 07/12/21  Yes [name], MD  ?dexlansoprazole (DEXILANT) 60 MG capsule Take 1 capsule (60 mg total) by mouth daily. 02/19/21  Yes Esterwood, Amy S, PA-C  ?famotidine (PEPCID) 20 MG tablet Take 1 tablet (20 mg total) by mouth 2 (two) times daily. Please schedule a yearly follow up for further refills. Thank you. 01/23/20  Yes Armbruster, Carlota Raspberry, MD  ?furosemide (LASIX) 40 MG tablet Take 1.5 tablets (60 mg total) by mouth daily. 08/14/21 08/14/22 Yes Weaver, Scott T, PA-C  ?hydrALAZINE (APRESOLINE) 10 MG tablet Take 1 tablet (10 mg total) by mouth 3 (three) times daily. 08/13/21 08/13/22 Yes Weaver, Scott T, PA-C  ?isosorbide mononitrate (IMDUR) 30 MG 24 hr tablet Take 0.5 tablets (15 mg total) by mouth daily. 08/13/21 08/13/22 Yes Weaver, Scott T, PA-C  ?polyethylene glycol powder (GLYCOLAX/MIRALAX) powder Take 17 g by mouth daily as needed for mild constipation. 07/08/17  Yes Daleen Squibb, MD  ?[facility] HFA 108 [phone] Base) MCG/ACT inhaler 1 puff daily as needed. 03/27/21   Yes Jaimie Redditt, Ines Bloomer, MD  ?rosuvastatin (CRESTOR) 20 MG tablet TAKE 1 TABLET (20 MG TOTAL) BY MOUTH AT BEDTIME. PLEASE MAKE YEARLY APPT WITH DR. Burt Knack FOR OCTOBER 2022 FOR FUTURE REFILLS. 04/16/21  Yes [name], MD  ?topiramate (TOPAMAX) 50 MG tablet Take 1 tablet (50 mg total) by mouth daily. 11/13/21  Yes Suzzanne Cloud, NP  ?nitroGLYCERIN (NITROSTAT) 0.4 MG SL tablet Place 1 tablet (0.4 mg total) under the tongue every 5 (five) minutes as needed for chest pain. 03/29/19 08/09/21  Richardson Dopp T, PA-C  ? ? ?Allergies  ?Allergen Reactions  ? Lisinopril   ?  cough  ? Sulfa Antibiotics Other (See Comments)  ?  Unknown, childhood allergy   ? Sulfonamide Derivatives Other (See Comments)  ?  UNSURE  ? ? ?Patient Active Problem List  ? Diagnosis Date Noted  ? Stage 3b chronic kidney disease ([place]) 08/08/2021  ? Chronic insomnia 11/14/2019  ? Colon polyp 06/16/2018  ? Thoracic aortic aneurysm ([place]) 03/31/2018  ? Chronic renal insufficiency   ? AICD (automatic cardioverter/defibrillator) present   ? Chronic HFrEF (heart failure with reduced ejection fraction) ([place])   ? COPD (chronic obstructive pulmonary disease) ([place])   ? Hyperlipemia   ? Insomnia   ? Pre-diabetes 06/19/2017  ? Gastric polyp   ? Osteoporosis 05/11/2013  ? Hearing loss 04/29/2013  ? Mild cognitive impairment 04/29/2013  ? Pseudoaneurysm  of aortic arch ([place]) 05/05/2012  ? Implantable cardioverter-defibrillator (ICD) in situ 08/06/2011  ? Cardiomyopathy, ischemic 06/12/2011  ? DDD (degenerative disc disease), lumbar 05/16/2011  ? History of pulmonary embolus (PE) 01/02/2011  ? UNSPECIFIED ANEMIA 07/03/2010  ? Essential hypertension 09/05/2009  ? DIVERTICULOSIS-COLON 08/14/2009  ? PERSONAL HX COLONIC POLYPS 04/24/2009  ? TOBACCO ABUSE 04/04/2009  ? Coronary artery disease involving native coronary artery of native heart with angina pectoris ([place]) 02/25/2009  ? GERD 10/19/2007  ? IBS 10/19/2007  ? COLONIC POLYPS, ADENOMATOUS 05/12/2007  ? ? ?Past  Medical History:  ?Diagnosis Date  ? Abdominal pain, left lower quadrant 08/14/2009  ? Qualifier: Diagnosis of  By: Laney Potash, Pam    ? Abnormal CT scan, stomach   ? Abnormal LFTs 06/19/2017  ? AICD (automatic cardioverter/defibrillator) present   ? Dr.Allred follows  ? Aortic arch pseudoaneurysm   ? a. followed by Dr. Prescott Gum.  ? Arthritis   ? Asthma   ? Benign neoplasm of colon   ? CAD (coronary artery disease) 02/2009  ? a. anterior STEMI rx with BMS to prox LAD in 02/2009. b. ISR s/p PTCA 06/2009. c. ISR s/p thrombectomy & PTCA 03/2010 due to late stent thrombosis. // Myoview 04/2019: EF 28, ant, ant-sept, inf-sept scar, no ischemia, high risk (stable>>cont med Rx)   ? Cardiomyopathy, ischemic 06/12/2011  ? Chronic renal insufficiency   ? stage 3  ? Chronic systolic CHF (congestive heart failure) ([place])   ? a. s/p ST. Jude ICD Oct 27, 2011.  ? Chronic systolic heart failure ([place]) 08/05/2011  ? Colon polyp   ? Complication of anesthesia   ? COPD (chronic obstructive pulmonary disease) ([place])   ? CORONARY ARTERY DISEASE 04/24/2009  ? Qualifier: Diagnosis of  By: Shane Crutch, Amy S   ? DDD (degenerative disc disease), lumbar 05/16/2011  ? Depression   ? "since my son died in 11-27-2022" 10/26/17)  ? Diverticulosis   ? DIVERTICULOSIS-COLON 08/14/2009  ? Qualifier: Diagnosis of  By: Shane Crutch, Amy S   ? DYSPHAGIA UNSPECIFIED 04/24/2009  ? Qualifier: Diagnosis of  By: Laney Potash, Pam    ? Dyspnea   ? "when I lay down at night"   ? Essential hypertension 09/05/2009  ? Qualifier: Diagnosis of  By: Harvest Dark CMA, Anderson Malta    ? Gastric polyp   ? GERD 10/19/2007  ? Qualifier: Diagnosis of  By: Rennie Plowman RN, Blanca Friend: Diagnosis of  By: Laney Potash, Pam    ? GERD (gastroesophageal reflux disease)   ? GI bleed   ? a. h/o GIB on DAPT, now on ASA only.  ? Headache 01/03/2013  ? Hearing loss   ? left ear  ? History of pulmonary embolus (PE) 01/02/2011  ? Hyperlipemia   ? Hyperlipidemia, unspecified 04/24/2009  ? Qualifier: Diagnosis of   By: Shane Crutch, Amy S   ? Hypertension   ? ICD-St.Jude 08/06/2011  ? S/p St. Jude ICD placement 08/05/11   ? Insomnia   ? Irritable bowel syndrome   ? Ischemic cardiomyopathy   ? EF 10-15%  ? Memory loss   ? Migraine headache without aura   ? Myocardial infarct ([place]) 2009/10/26 x 2  ? Dr. Cooper,cardiology   ? PERSONAL HX COLONIC POLYPS 04/24/2009  ? Qualifier: Diagnosis of  By: Shane Crutch, Amy S November, 2011 colonoscopy demonstrated a sessile cecal polyp and sigmoid polyp   ? Pneumonia   ? PONV (postoperative nausea and vomiting)   ? Pre-diabetes   ?  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 Pulmonary embolism ([place]) 2011  ? Stroke [facility])   ? 'When I was young." no residual  ? Thoracic aortic aneurysm 03/31/2018  ? TOBACCO ABUSE 04/04/2009  ? Qualifier: Diagnosis of  By: Arvid Right  Qualifier: Diagnosis of  By: [name] MD, Jaquelyn Bitter   ? Transfusion history   ? ?'12 or '13  ? UNSPECIFIED ANEMIA 07/03/2010  ? Qualifier: Diagnosis of  By: Shelda Pal    ? Unspecified mastoiditis   ? ? ?Past Surgical History:  ?Procedure Laterality Date  ? ANGIOPLASTY  07/02/09, 04/01/10  ? BILATERAL SALPINGOOPHORECTOMY    ? CAD( bare metal stent)  02/2009  ? x 1  ? CAROTID-SUBCLAVIAN BYPASS GRAFT Left 03/31/2018  ? Procedure: LEFT SUBCLAVIAN ARTERY BYPASS GRAFT;  Surgeon: Serafina Mitchell, MD;  Location: [facility];  Service: Vascular;  Laterality: Left;  ? CERVICAL SPINE SURGERY  08/08  ? COLONOSCOPY WITH PROPOFOL N/A 04/29/2016  ? Procedure: COLONOSCOPY WITH PROPOFOL;  Surgeon: [name], MD;  Location: [facility];  Service: Gastroenterology;  Laterality: N/A;  ? ESOPHAGOGASTRODUODENOSCOPY N/A 12/09/2016  ? Procedure: ESOPHAGOGASTRODUODENOSCOPY (EGD);  Surgeon: [name], MD;  Location: [facility];  Service: Gastroenterology;  Laterality: N/A;  ? ESOPHAGOGASTRODUODENOSCOPY (EGD) WITH PROPOFOL N/A 04/29/2016  ? Procedure: ESOPHAGOGASTRODUODENOSCOPY (EGD) WITH PROPOFOL;  Surgeon: [name], MD;  Location: [facility];  Service: Gastroenterology;  Laterality: N/A;  ? EUS N/A 05/15/2016  ? Procedure: UPPER ENDOSCOPIC ULTRASOUND (EUS) RADIAL;  Surgeon: Milus Banister, MD;  Location: [facility]

## 2021-11-21 NOTE — Assessment & Plan Note (Addendum)
Clinical stable.  Stable vital signs.  Afebrile. ?Benign abdominal examination. ?Normal white cell count.  Normal lipase. ?Chronic stage IV kidney disease. ?Negative urinalysis.  No infection. ?Differential diagnosis discussed with patient. ?ED precautions given. ?Advised to stay well-hydrated and take Bentyl as needed. ?Follow-up in the office if no better or worse during the next several days. ?

## 2021-11-21 NOTE — Patient Instructions (Signed)
Abdominal Pain, Adult Many things can cause belly (abdominal) pain. Most times, belly pain is not dangerous. Many cases of belly pain can be watched and treated at home. Sometimes, though, belly pain is serious. Your doctor will try to find the cause of your belly pain. Follow these instructions at home:  Medicines Take over-the-counter and prescription medicines only as told by your doctor. Do not take medicines that help you poop (laxatives) unless told by your doctor. General instructions Watch your belly pain for any changes. Drink enough fluid to keep your pee (urine) pale yellow. Keep all follow-up visits as told by your doctor. This is important. Contact a doctor if: Your belly pain changes or gets worse. You are not hungry, or you lose weight without trying. You are having trouble pooping (constipated) or have watery poop (diarrhea) for more than 2-3 days. You have pain when you pee or poop. Your belly pain wakes you up at night. Your pain gets worse with meals, after eating, or with certain foods. You are vomiting and cannot keep anything down. You have a fever. You have blood in your pee. Get help right away if: Your pain does not go away as soon as your doctor says it should. You cannot stop vomiting. Your pain is only in areas of your belly, such as the right side or the left lower part of the belly. You have bloody or black poop, or poop that looks like tar. You have very bad pain, cramping, or bloating in your belly. You have signs of not having enough fluid or water in your body (dehydration), such as: Dark pee, very little pee, or no pee. Cracked lips. Dry mouth. Sunken eyes. Sleepiness. Weakness. You have trouble breathing or chest pain. Summary Many cases of belly pain can be watched and treated at home. Watch your belly pain for any changes. Take over-the-counter and prescription medicines only as told by your doctor. Contact a doctor if your belly pain  changes or gets worse. Get help right away if you have very bad pain, cramping, or bloating in your belly. This information is not intended to replace advice given to you by your health care provider. Make sure you discuss any questions you have with your health care provider. Document Revised: 11/22/2018 Document Reviewed: 11/22/2018 Elsevier Patient Education  2023 Elsevier Inc.  

## 2021-11-25 ENCOUNTER — Other Ambulatory Visit: Payer: Self-pay | Admitting: Emergency Medicine

## 2021-11-25 ENCOUNTER — Telehealth: Payer: Self-pay | Admitting: Emergency Medicine

## 2021-11-25 DIAGNOSIS — N184 Chronic kidney disease, stage 4 (severe): Secondary | ICD-10-CM

## 2021-11-25 NOTE — Telephone Encounter (Signed)
Pt checking status of 11-21-2021 lab and imaging results ? ?Pt requesting a cb to her son once results are available ? ?Phone [phone] Chrissie Noa ?

## 2021-11-25 NOTE — Telephone Encounter (Signed)
Patient son notified of referral placed to nephrology  ?

## 2021-11-25 NOTE — Telephone Encounter (Signed)
Negative x-rays.  Blood work shows chronic anemia and chronic kidney disease.  Numbers at baseline's.  No acute findings.

## 2021-11-25 NOTE — Telephone Encounter (Signed)
Nephrology referral placed today.  Thanks.

## 2021-12-02 DIAGNOSIS — I5020 Unspecified systolic (congestive) heart failure: Secondary | ICD-10-CM | POA: Insufficient documentation

## 2021-12-02 NOTE — H&P (View-Only) (Signed)
?Cardiology Office Note:   ? ?Date:  12/03/2021  ? ?ID:  [name], DOB 1945/10/28, MRN [id number] ? ?PCP:  Horald Pollen, MD  ?[facility] HeartCare Providers ?Cardiologist:  [name], MD ?Electrophysiologist:  Thompson Grayer, MD    ?Referring MD: Horald Pollen, *  ? ?Chief Complaint:  F/u for CAD, CHF ?  ? ?Patient Profile: ?Coronary artery disease  ?S/p anterior MI in 2010 >> PCI: BMS to prox LAD ?S/p multiple PCIs since MI in 2010 2/2 ISR ?Myoview 04/2019: Lg scar, no ischemia, EF 28 ?Chronic systolic CHF ?Ischemic CM ?S/p AICD ?Hx of pulmonary embolism  ?COPD ?Chronic kidney disease  ?Prior Gi bleeding  ?Hypertension  ?Hyperlipidemia  ?Thoracic aortic aneurysm ?S/p L carotid-subclavian transposition and endovascular repair of aneurysm 03/2018  ?Aortic atherosclerosis ? ?Prior CV Studies: ?Echocardiogram 05/01/2021 ?No LV thrombus, EF 25-30, G1 DD, apical anteroseptal/apical/lateral AK, normal RVSF, mild RAE ?  ?Myoview 04/17/2021  ?Large infarct, no ischemia, EF 18; high risk ?  ?ABI 03/15/18 ?Normal ?  ?Carotid US 09/08/16 ?bilat 1-39 ?  ?LHC (06/2010):   ?prox LAD stent ok with 30-50% ISR, prox to mid RCA 30-40%. ?  ? ?History of Present Illness:   ?[name] is a 76 y.o. female with the above problem list.  She was last seen in Jan 2023.  She had undergone stress testing prior to this visit that demonstrated a large infarct but no ischemia.  A f/u echocardiogram showed EF 25-30.  She continued to have chest pain and dyspnea on exertion.  Her symptoms were severe.  I reviewed her case with Dr. Burt Knack.  He did not feel that she would be able to tolerate ARB or ARNI due to chronic kidney disease.  He thought we should have a low threshold for R cardiac catheterization to guide diuresis.   I put her on hydralazine, nitrates.  She returns for f/u.  She is here alone.  Her symptoms remain unchanged.  She has significant shortness of breath with minimal activity.  She sometimes has associated  chest discomfort.  She has not had orthopnea, leg edema or syncope.  She has had a lot of gastrointestinal issues recently. ?   ?Past Medical History:  ?Diagnosis Date  ? Abdominal pain, left lower quadrant 08/14/2009  ? Qualifier: Diagnosis of  By: Laney Potash, Pam    ? Abnormal CT scan, stomach   ? Abnormal LFTs 06/19/2017  ? AICD (automatic cardioverter/defibrillator) present   ? Dr.Allred follows  ? Aortic arch pseudoaneurysm (HCC)   ? a. followed by Dr. Prescott Gum.  ? Arthritis   ? Asthma   ? Benign neoplasm of colon   ? CAD (coronary artery disease) 02/2009  ? a. anterior STEMI rx with BMS to prox LAD in 02/2009. b. ISR s/p PTCA 06/2009. c. ISR s/p thrombectomy & PTCA 03/2010 due to late stent thrombosis. // Myoview 04/2019: EF 28, ant, ant-sept, inf-sept scar, no ischemia, high risk (stable>>cont med Rx)   ? Cardiomyopathy, ischemic 06/12/2011  ? Chronic renal insufficiency   ? stage 3  ? Chronic systolic CHF (congestive heart failure) ([place])   ? a. s/p ST. Jude ICD 2013.  ? Chronic systolic heart failure ([place]) 08/05/2011  ? Colon polyp   ? Complication of anesthesia   ? COPD (chronic obstructive pulmonary disease) ([place])   ? CORONARY ARTERY DISEASE 04/24/2009  ? Qualifier: Diagnosis of  By: Shane Crutch, Amy S   ? DDD (degenerative disc disease), lumbar 05/16/2011  ?  Depression   ? "since my son died in 11/15/22" 14-Oct-2017)  ? Diverticulosis   ? DIVERTICULOSIS-COLON 08/14/2009  ? Qualifier: Diagnosis of  By: Shane Crutch, Amy S   ? DYSPHAGIA UNSPECIFIED 04/24/2009  ? Qualifier: Diagnosis of  By: Laney Potash, Pam    ? Dyspnea   ? "when I lay down at night"   ? Essential hypertension 09/05/2009  ? Qualifier: Diagnosis of  By: Harvest Dark CMA, Anderson Malta    ? Gastric polyp   ? GERD 10/19/2007  ? Qualifier: Diagnosis of  By: Rennie Plowman RN, Blanca Friend: Diagnosis of  By: Laney Potash, Pam    ? GERD (gastroesophageal reflux disease)   ? GI bleed   ? a. h/o GIB on DAPT, now on ASA only.  ? Headache 01/03/2013  ? Hearing loss   ?  left ear  ? History of pulmonary embolus (PE) 01/02/2011  ? Hyperlipemia   ? Hyperlipidemia, unspecified 04/24/2009  ? Qualifier: Diagnosis of  By: Shane Crutch, Amy S   ? Hypertension   ? ICD-St.Jude 08/06/2011  ? S/p St. Jude ICD placement 08/05/11   ? Insomnia   ? Irritable bowel syndrome   ? Ischemic cardiomyopathy   ? EF 10-15%  ? Memory loss   ? Migraine headache without aura   ? Myocardial infarct ([place]) 10/14/2009 x 2  ? Dr. Cooper,cardiology   ? PERSONAL HX COLONIC POLYPS 04/24/2009  ? Qualifier: Diagnosis of  By: Shane Crutch, Amy S November, 2011 colonoscopy demonstrated a sessile cecal polyp and sigmoid polyp   ? Pneumonia   ? PONV (postoperative nausea and vomiting)   ? Pre-diabetes   ? Pseudoaneurysm of aortic arch ([place]) 05/05/2012  ? Pulmonary embolism ([place]) 10-14-2009  ? Stroke [facility])   ? 'When I was young." no residual  ? Thoracic aortic aneurysm ([place]) 03/31/2018  ? TOBACCO ABUSE 04/04/2009  ? Qualifier: Diagnosis of  By: Arvid Right  Qualifier: Diagnosis of  By: [name] MD, Jaquelyn Bitter   ? Transfusion history   ? ?'12 or '13  ? UNSPECIFIED ANEMIA 07/03/2010  ? Qualifier: Diagnosis of  By: Shelda Pal    ? Unspecified mastoiditis   ? ?Current Medications: ?Current Meds  ?Medication Sig  ? amitriptyline (ELAVIL) 50 MG tablet Take 1 tablet (50 mg total) by mouth at bedtime.  ? aspirin 81 MG tablet Take 81 mg by mouth every morning.  ? bisoprolol (ZEBETA) 5 MG tablet TAKE 1 TABLET BY MOUTH DAILY.  ? dexlansoprazole (DEXILANT) 60 MG capsule Take 1 capsule (60 mg total) by mouth daily.  ? dicyclomine (BENTYL) 10 MG capsule Take 1 capsule (10 mg total) by mouth every 6 (six) hours as needed for spasms.  ? famotidine (PEPCID) 20 MG tablet Take 1 tablet (20 mg total) by mouth 2 (two) times daily. Please schedule a yearly follow up for further refills. Thank you.  ? furosemide (LASIX) 40 MG tablet Take 1.5 tablets (60 mg total) by mouth daily.  ? hydrALAZINE (APRESOLINE) 10 MG tablet Take 1 tablet (10 mg  total) by mouth 3 (three) times daily.  ? isosorbide mononitrate (IMDUR) 30 MG 24 hr tablet Take 0.5 tablets (15 mg total) by mouth daily.  ? polyethylene glycol powder (GLYCOLAX/MIRALAX) powder Take 17 g by mouth daily as needed for mild constipation.  ? PROAIR HFA 108 (90 Base) MCG/ACT inhaler 1 puff daily as needed.  ? rosuvastatin (CRESTOR) 20 MG tablet TAKE 1 TABLET (20 MG TOTAL) BY MOUTH AT BEDTIME.  PLEASE MAKE YEARLY APPT WITH DR. Burt Knack FOR OCTOBER 2022 FOR FUTURE REFILLS.  ? topiramate (TOPAMAX) 50 MG tablet Take 1 tablet (50 mg total) by mouth daily.  ? traMADol (ULTRAM) 50 MG tablet Take 50 mg by mouth every 8 (eight) hours as needed.  ?  ?Allergies:   Lisinopril, Sulfa antibiotics, and Sulfonamide derivatives  ? ?Social History  ? ?Tobacco Use  ? Smoking status: Former  ?  Packs/day: 0.13  ?  Years: 30.00  ?  Pack years: 3.90  ?  Types: Cigarettes  ? Smokeless tobacco: Never  ?Vaping Use  ? Vaping Use: Never used  ?Substance Use Topics  ? Alcohol use: No  ?  Alcohol/week: 0.0 standard drinks  ? Drug use: No  ?  ?Family Hx: ?The patient's family history includes Bladder Cancer in her brother; Breast cancer in her cousin; Cancer in her son; Colon cancer in her mother; Other in her brother; Throat cancer in her brother. ? ?Review of Systems  ?Gastrointestinal:  Negative for hematochezia.  ?Genitourinary:  Negative for hematuria.   ? ?EKGs/Labs/Other Test Reviewed:   ? ?EKG:  EKG is not ordered today.  The ekg ordered today demonstrates n/a ? ?Recent Labs: ?04/09/2021: TSH 5.580 ?05/22/2021: BNP 188.2 ?08/09/2021: NT-Pro BNP 945 ?11/21/2021: ALT 15; BUN 26; Creatinine, Ser 1.85; Hemoglobin 10.5; Platelets 249.0; Potassium 3.9; Sodium 138  ? ?Recent Lipid Panel ?No results for input(s): CHOL, TRIG, HDL, VLDL, LDLCALC, LDLDIRECT in the last 8760 hours.  ? ?Risk Assessment/Calculations:   ?  ?    ?Physical Exam:   ? ?VS:  BP 118/78   Pulse 81   Ht '5\' 2"'$  (1.575 m)   Wt 175 lb (79.4 kg)   SpO2 98%   BMI 32.01  kg/m?    ? ?Wt Readings from Last 3 Encounters:  ?12/03/21 175 lb (79.4 kg)  ?11/21/21 177 lb 8 oz (80.5 kg)  ?11/13/21 180 lb (81.6 kg)  ?  ?Constitutional:   ?   Appearance: Healthy appearance. Not in distress

## 2021-12-02 NOTE — Progress Notes (Signed)
?Cardiology Office Note:   ? ?Date:  12/03/2021  ? ?ID:  MIRINDA MONTE, DOB 07-Jul-1946, MRN [id number] ? ?PCP:  Horald Pollen, MD  ?[facility] HeartCare Providers ?Cardiologist:  [name], MD ?Electrophysiologist:  Thompson Grayer, MD    ?Referring MD: Horald Pollen, *  ? ?Chief Complaint:  F/u for CAD, CHF ?  ? ?Patient Profile: ?Coronary artery disease  ?S/p anterior MI in 2010 >> PCI: BMS to prox LAD ?S/p multiple PCIs since MI in 2010 2/2 ISR ?Myoview 04/2019: Lg scar, no ischemia, EF 28 ?Chronic systolic CHF ?Ischemic CM ?S/p AICD ?Hx of pulmonary embolism  ?COPD ?Chronic kidney disease  ?Prior Gi bleeding  ?Hypertension  ?Hyperlipidemia  ?Thoracic aortic aneurysm ?S/p L carotid-subclavian transposition and endovascular repair of aneurysm 03/2018  ?Aortic atherosclerosis ? ?Prior CV Studies: ?Echocardiogram 05/01/2021 ?No LV thrombus, EF 25-30, G1 DD, apical anteroseptal/apical/lateral AK, normal RVSF, mild RAE ?  ?Myoview 04/17/2021  ?Large infarct, no ischemia, EF 18; high risk ?  ?ABI 03/15/18 ?Normal ?  ?Carotid US 09/08/16 ?bilat 1-39 ?  ?LHC (06/2010):   ?prox LAD stent ok with 30-50% ISR, prox to mid RCA 30-40%. ?  ? ?History of Present Illness:   ?LAELYNN BLIZZARD is a 76 y.o. female with the above problem list.  She was last seen in Jan 2023.  She had undergone stress testing prior to this visit that demonstrated a large infarct but no ischemia.  A f/u echocardiogram showed EF 25-30.  She continued to have chest pain and dyspnea on exertion.  Her symptoms were severe.  I reviewed her case with Dr. Burt Knack.  He did not feel that she would be able to tolerate ARB or ARNI due to chronic kidney disease.  He thought we should have a low threshold for R cardiac catheterization to guide diuresis.   I put her on hydralazine, nitrates.  She returns for f/u.  She is here alone.  Her symptoms remain unchanged.  She has significant shortness of breath with minimal activity.  She sometimes has associated  chest discomfort.  She has not had orthopnea, leg edema or syncope.  She has had a lot of gastrointestinal issues recently. ?   ?Past Medical History:  ?Diagnosis Date  ? Abdominal pain, left lower quadrant 08/14/2009  ? Qualifier: Diagnosis of  By: Laney Potash, Pam    ? Abnormal CT scan, stomach   ? Abnormal LFTs 06/19/2017  ? AICD (automatic cardioverter/defibrillator) present   ? Dr.Allred follows  ? Aortic arch pseudoaneurysm (HCC)   ? a. followed by Dr. Prescott Gum.  ? Arthritis   ? Asthma   ? Benign neoplasm of colon   ? CAD (coronary artery disease) 02/2009  ? a. anterior STEMI rx with BMS to prox LAD in 02/2009. b. ISR s/p PTCA 06/2009. c. ISR s/p thrombectomy & PTCA 03/2010 due to late stent thrombosis. // Myoview 04/2019: EF 28, ant, ant-sept, inf-sept scar, no ischemia, high risk (stable>>cont med Rx)   ? Cardiomyopathy, ischemic 06/12/2011  ? Chronic renal insufficiency   ? stage 3  ? Chronic systolic CHF (congestive heart failure) ([place])   ? a. s/p ST. Jude ICD 2013.  ? Chronic systolic heart failure ([place]) 08/05/2011  ? Colon polyp   ? Complication of anesthesia   ? COPD (chronic obstructive pulmonary disease) ([place])   ? CORONARY ARTERY DISEASE 04/24/2009  ? Qualifier: Diagnosis of  By: Shane Crutch, Amy S   ? DDD (degenerative disc disease), lumbar 05/16/2011  ?  Depression   ? "since my son died in 04-Dec-2022" 11-02-17)  ? Diverticulosis   ? DIVERTICULOSIS-COLON 08/14/2009  ? Qualifier: Diagnosis of  By: Shane Crutch, Amy S   ? DYSPHAGIA UNSPECIFIED 04/24/2009  ? Qualifier: Diagnosis of  By: Laney Potash, Pam    ? Dyspnea   ? "when I lay down at night"   ? Essential hypertension 09/05/2009  ? Qualifier: Diagnosis of  By: Harvest Dark CMA, Anderson Malta    ? Gastric polyp   ? GERD 10/19/2007  ? Qualifier: Diagnosis of  By: Rennie Plowman RN, Blanca Friend: Diagnosis of  By: Laney Potash, Pam    ? GERD (gastroesophageal reflux disease)   ? GI bleed   ? a. h/o GIB on DAPT, now on ASA only.  ? Headache 01/03/2013  ? Hearing loss   ?  left ear  ? History of pulmonary embolus (PE) 01/02/2011  ? Hyperlipemia   ? Hyperlipidemia, unspecified 04/24/2009  ? Qualifier: Diagnosis of  By: Shane Crutch, Amy S   ? Hypertension   ? ICD-St.Jude 08/06/2011  ? S/p St. Jude ICD placement 08/05/11   ? Insomnia   ? Irritable bowel syndrome   ? Ischemic cardiomyopathy   ? EF 10-15%  ? Memory loss   ? Migraine headache without aura   ? Myocardial infarct ([place]) 2009-11-02 x 2  ? Dr. Cooper,cardiology   ? PERSONAL HX COLONIC POLYPS 04/24/2009  ? Qualifier: Diagnosis of  By: Shane Crutch, Amy S November, 2011 colonoscopy demonstrated a sessile cecal polyp and sigmoid polyp   ? Pneumonia   ? PONV (postoperative nausea and vomiting)   ? Pre-diabetes   ? Pseudoaneurysm of aortic arch ([place]) 05/05/2012  ? Pulmonary embolism ([place]) 02-Nov-2009  ? Stroke [facility])   ? 'When I was young." no residual  ? Thoracic aortic aneurysm ([place]) 03/31/2018  ? TOBACCO ABUSE 04/04/2009  ? Qualifier: Diagnosis of  By: Arvid Right  Qualifier: Diagnosis of  By: [name] MD, Jaquelyn Bitter   ? Transfusion history   ? ?'12 or '13  ? UNSPECIFIED ANEMIA 07/03/2010  ? Qualifier: Diagnosis of  By: Shelda Pal    ? Unspecified mastoiditis   ? ?Current Medications: ?Current Meds  ?Medication Sig  ? amitriptyline (ELAVIL) 50 MG tablet Take 1 tablet (50 mg total) by mouth at bedtime.  ? aspirin 81 MG tablet Take 81 mg by mouth every morning.  ? bisoprolol (ZEBETA) 5 MG tablet TAKE 1 TABLET BY MOUTH DAILY.  ? dexlansoprazole (DEXILANT) 60 MG capsule Take 1 capsule (60 mg total) by mouth daily.  ? dicyclomine (BENTYL) 10 MG capsule Take 1 capsule (10 mg total) by mouth every 6 (six) hours as needed for spasms.  ? famotidine (PEPCID) 20 MG tablet Take 1 tablet (20 mg total) by mouth 2 (two) times daily. Please schedule a yearly follow up for further refills. Thank you.  ? furosemide (LASIX) 40 MG tablet Take 1.5 tablets (60 mg total) by mouth daily.  ? hydrALAZINE (APRESOLINE) 10 MG tablet Take 1 tablet (10 mg  total) by mouth 3 (three) times daily.  ? isosorbide mononitrate (IMDUR) 30 MG 24 hr tablet Take 0.5 tablets (15 mg total) by mouth daily.  ? polyethylene glycol powder (GLYCOLAX/MIRALAX) powder Take 17 g by mouth daily as needed for mild constipation.  ? PROAIR HFA 108 (90 Base) MCG/ACT inhaler 1 puff daily as needed.  ? rosuvastatin (CRESTOR) 20 MG tablet TAKE 1 TABLET (20 MG TOTAL) BY MOUTH AT BEDTIME.  PLEASE MAKE YEARLY APPT WITH DR. Burt Knack FOR OCTOBER 2022 FOR FUTURE REFILLS.  ? topiramate (TOPAMAX) 50 MG tablet Take 1 tablet (50 mg total) by mouth daily.  ? traMADol (ULTRAM) 50 MG tablet Take 50 mg by mouth every 8 (eight) hours as needed.  ?  ?Allergies:   Lisinopril, Sulfa antibiotics, and Sulfonamide derivatives  ? ?Social History  ? ?Tobacco Use  ? Smoking status: Former  ?  Packs/day: 0.13  ?  Years: 30.00  ?  Pack years: 3.90  ?  Types: Cigarettes  ? Smokeless tobacco: Never  ?Vaping Use  ? Vaping Use: Never used  ?Substance Use Topics  ? Alcohol use: No  ?  Alcohol/week: 0.0 standard drinks  ? Drug use: No  ?  ?Family Hx: ?The patient's family history includes Bladder Cancer in her brother; Breast cancer in her cousin; Cancer in her son; Colon cancer in her mother; Other in her brother; Throat cancer in her brother. ? ?Review of Systems  ?Gastrointestinal:  Negative for hematochezia.  ?Genitourinary:  Negative for hematuria.   ? ?EKGs/Labs/Other Test Reviewed:   ? ?EKG:  EKG is not ordered today.  The ekg ordered today demonstrates n/a ? ?Recent Labs: ?04/09/2021: TSH 5.580 ?05/22/2021: BNP 188.2 ?08/09/2021: NT-Pro BNP 945 ?11/21/2021: ALT 15; BUN 26; Creatinine, Ser 1.85; Hemoglobin 10.5; Platelets 249.0; Potassium 3.9; Sodium 138  ? ?Recent Lipid Panel ?No results for input(s): CHOL, TRIG, HDL, VLDL, LDLCALC, LDLDIRECT in the last 8760 hours.  ? ?Risk Assessment/Calculations:   ?  ?    ?Physical Exam:   ? ?VS:  BP 118/78   Pulse 81   Ht '5\' 2"'$  (1.575 m)   Wt 175 lb (79.4 kg)   SpO2 98%   BMI 32.01  kg/m?    ? ?Wt Readings from Last 3 Encounters:  ?12/03/21 175 lb (79.4 kg)  ?11/21/21 177 lb 8 oz (80.5 kg)  ?11/13/21 180 lb (81.6 kg)  ?  ?Constitutional:   ?   Appearance: Healthy appearance. Not in distress

## 2021-12-03 ENCOUNTER — Encounter: Payer: Self-pay | Admitting: Physician Assistant

## 2021-12-03 ENCOUNTER — Ambulatory Visit (INDEPENDENT_AMBULATORY_CARE_PROVIDER_SITE_OTHER): Payer: Medicare Other | Admitting: Physician Assistant

## 2021-12-03 VITALS — BP 118/78 | HR 81 | Ht 62.0 in | Wt 175.0 lb

## 2021-12-03 DIAGNOSIS — I5020 Unspecified systolic (congestive) heart failure: Secondary | ICD-10-CM | POA: Diagnosis not present

## 2021-12-03 DIAGNOSIS — I25119 Atherosclerotic heart disease of native coronary artery with unspecified angina pectoris: Secondary | ICD-10-CM | POA: Diagnosis not present

## 2021-12-03 DIAGNOSIS — I10 Essential (primary) hypertension: Secondary | ICD-10-CM | POA: Diagnosis not present

## 2021-12-03 DIAGNOSIS — N1832 Chronic kidney disease, stage 3b: Secondary | ICD-10-CM

## 2021-12-03 DIAGNOSIS — E7800 Pure hypercholesterolemia, unspecified: Secondary | ICD-10-CM

## 2021-12-03 DIAGNOSIS — I712 Thoracic aortic aneurysm, without rupture, unspecified: Secondary | ICD-10-CM | POA: Diagnosis not present

## 2021-12-03 NOTE — Patient Instructions (Signed)
Medication Instructions:  ?Your physician recommends that you continue on your current medications as directed. Please refer to the Current Medication list given to you today. ? ?*If you need a refill on your cardiac medications before your next appointment, please call your pharmacy* ? ? ?Lab Work: ?COME TO THE OFFICE, Friday, 12/06/21 FOR:  CMET, CBC, & LIPID ? ?If you have labs (blood work) drawn today and your tests are completely normal, you will receive your results only by: ?MyChart Message (if you have MyChart) OR ?A paper copy in the mail ?If you have any lab test that is abnormal or we need to change your treatment, we will call you to review the results. ? ? ?Testing/Procedures: ?Your physician has requested that you have a cardiac catheterization. Cardiac catheterization is used to diagnose and/or treat various heart conditions. Doctors may recommend this procedure for a number of different reasons. The most common reason is to evaluate chest pain. Chest pain can be a symptom of coronary artery disease (CAD), and cardiac catheterization can show whether plaque is narrowing or blocking your heart?s arteries. This procedure is also used to evaluate the valves, as well as measure the blood flow and oxygen levels in different parts of your heart. For further information please visit [email or web address]. Please follow instruction sheet, BELOW. ? ? ?[place] ?[place] OFFICE ?[address], [address] ?[place] [address] ?Dept: [phone] ?Loc: [phone] ? ?[name]  12/03/2021 ? ?You are scheduled for a Cardiac Catheterization on Wednesday, May 17 with Dr. [name]. ? ?1. Please arrive at the Main Entrance A at [facility]: [place], [place] [address] at 12:30 PM (This time is two hours before your procedure to ensure your preparation). Free valet parking service is available.  ? ?Special note:  Every effort is made to have your procedure done on time. Please understand that emergencies sometimes delay scheduled procedures. ? ?2. Diet: Do not eat solid foods after midnight.  You may have clear liquids until 5 AM upon the day of the procedure. ? ?3. Labs: You will need to have blood drawn on Friday 12/06/21 ANYTIME AFTER 7:15 BEFORE 5:00 . You do need to be fasting. ? ?4. Medication instructions in preparation for your procedure: ? ? Contrast Allergy: No ? ? ?Stop taking, Lasix (Furosemide)  Wednesday, May 17, ? ? ?On the morning of your procedure, take Aspirin and any morning medicines NOT listed above.  You may use sips of water. ? ?5. Plan to go home the same day, you will only stay overnight if medically necessary. ?6. You MUST have a responsible adult to drive you home. ?7. An adult MUST be with you the first 24 hours after you arrive home. ?8. Bring a current list of your medications, and the last time and date medication taken. ?9. Bring ID and current insurance cards. ?10.Please wear clothes that are easy to get on and off and wear slip-on shoes. ? ?Thank you for allowing [place] to care for you! ?  --  Invasive Cardiovascular services ? ? ? ? ?Follow-Up: ?At [facility], you and your health needs are our priority.  As part of our continuing mission to provide you with exceptional heart care, we have created designated Provider Care Teams.  These Care Teams include your primary Cardiologist (physician) and Advanced Practice Providers (APPs -  Physician Assistants and Nurse Practitioners) who all work together to provide you  with the care you need, when you need it. ? ?We recommend signing up for the patient portal called "MyChart".  Sign up information is provided on this After Visit Summary.  MyChart is used to connect with patients for Virtual Visits (Telemedicine).  Patients are able to view lab/test results, encounter notes, upcoming appointments, etc.  Non-urgent messages can be sent to  your provider as well.   ?To learn more about what you can do with MyChart, go to [email or web address].   ? ?Your next appointment:   ?12/25/21 ARRIVE AT 2:05 ? ?The format for your next appointment:   ?In Person ? ?Provider:   ?Richardson Dopp, PA-C ? ? ?Other Instructions ? ? ?Important Information About Sugar ? ? ? ? ?  ?

## 2021-12-03 NOTE — Assessment & Plan Note (Signed)
Ischemic cardiomyopathy.  EF 25-30.  NYHA III-IIIb.  Overall, her volume status appears stable on exam.  Her renal function prohibits the use of SGLT2 inhibitor, MRA and now ACE/ARB/ARNI.  At last visit I did adjust her diuretics somewhat.  She has not noticed a significant difference.  I have reviewed her case previously with Dr. Burt Knack.  He had suggested proceeding with right-sided heart catheterization if her symptoms continued.  Therefore, I recommended proceeding with right-sided heart catheterization.  She did asked me to contact her son to go over the procedure.  I called him [name]) today and answered all of his questions. ?? Arrange right-sided heart catheterization ?? Continue bisoprolol 5 mg daily, furosemide 60 mg daily, hydralazine 10 mg 3 times a day, isosorbide mononitrate 15 mg daily ?? Follow-up after procedure ?? If filling pressures normal, consider referral to pulmonology ?

## 2021-12-03 NOTE — Assessment & Plan Note (Signed)
Continue Crestor 20 mg daily.  Arrange fasting CMET, lipids with precath labs. ?

## 2021-12-03 NOTE — Assessment & Plan Note (Signed)
The patient's blood pressure is controlled on her current regimen.  Continue current therapy.   

## 2021-12-03 NOTE — Assessment & Plan Note (Addendum)
S/p endovascular repair.  Follow-up with vascular surgery as planned. ?

## 2021-12-03 NOTE — Assessment & Plan Note (Signed)
History of anterior STEMI in 2010 treated with BMS to the LAD.  She has had multiple PCI procedures since secondary to in-stent restenosis.  Her most recent nuclear stress test demonstrated extensive scar but no ischemia.  She does have chest discomfort associate with her shortness of breath.  Given her renal function, we should try to avoid cardiac catheterization if at all possible.    Overall, her symptoms have remained fairly stable.  She is mainly troubled by shortness of breath which is likely related to HFrEF versus Chronic Obstructive Pulmonary Disease.   ?? Continue aspirin 81 mg daily, bisoprolol 5 mg daily, isosorbide mononitrate 15 mg daily, Crestor 20 mg daily. ?

## 2021-12-03 NOTE — Assessment & Plan Note (Signed)
Follow-up with nephrology as planned ?

## 2021-12-06 ENCOUNTER — Other Ambulatory Visit: Payer: Medicare Other | Admitting: *Deleted

## 2021-12-06 DIAGNOSIS — I712 Thoracic aortic aneurysm, without rupture, unspecified: Secondary | ICD-10-CM

## 2021-12-06 DIAGNOSIS — I25119 Atherosclerotic heart disease of native coronary artery with unspecified angina pectoris: Secondary | ICD-10-CM

## 2021-12-06 DIAGNOSIS — N1832 Chronic kidney disease, stage 3b: Secondary | ICD-10-CM

## 2021-12-06 DIAGNOSIS — E7800 Pure hypercholesterolemia, unspecified: Secondary | ICD-10-CM

## 2021-12-06 DIAGNOSIS — I10 Essential (primary) hypertension: Secondary | ICD-10-CM

## 2021-12-06 DIAGNOSIS — I5020 Unspecified systolic (congestive) heart failure: Secondary | ICD-10-CM

## 2021-12-07 LAB — LIPID PANEL
Chol/HDL Ratio: 2.5 ratio (ref 0.0–4.4)
Cholesterol, Total: 126 mg/dL (ref 100–199)
HDL: 51 mg/dL (ref >39)
LDL Chol Calc (NIH): 53 mg/dL (ref 0–99)
Triglycerides: 122 mg/dL (ref 0–149)
VLDL Cholesterol Cal: 22 mg/dL (ref 5–40)

## 2021-12-07 LAB — COMPREHENSIVE METABOLIC PANEL
ALT: 13 IU/L (ref 0–32)
AST: 17 IU/L (ref 0–40)
Albumin/Globulin Ratio: 1.7 (ref 1.2–2.2)
Albumin: 4.3 g/dL (ref 3.7–4.7)
Alkaline Phosphatase: 108 IU/L (ref 44–121)
BUN/Creatinine Ratio: 14 (ref 12–28)
BUN: 28 mg/dL — ABNORMAL HIGH (ref 8–27)
Bilirubin Total: 0.4 mg/dL (ref 0.0–1.2)
CO2: 21 mmol/L (ref 20–29)
Calcium: 9.3 mg/dL (ref 8.7–10.3)
Chloride: 104 mmol/L (ref 96–106)
Creatinine, Ser: 2.01 mg/dL — ABNORMAL HIGH (ref 0.57–1.00)
Globulin, Total: 2.5 g/dL (ref 1.5–4.5)
Glucose: 106 mg/dL — ABNORMAL HIGH (ref 70–99)
Potassium: 4.3 mmol/L (ref 3.5–5.2)
Sodium: 141 mmol/L (ref 134–144)
Total Protein: 6.8 g/dL (ref 6.0–8.5)
eGFR: 26 mL/min/{1.73_m2} — ABNORMAL LOW (ref >59)

## 2021-12-07 LAB — CBC
Hematocrit: 33.4 % — ABNORMAL LOW (ref 34.0–46.6)
Hemoglobin: 11.2 g/dL (ref 11.1–15.9)
MCH: 32.8 pg (ref 26.6–33.0)
MCHC: 33.5 g/dL (ref 31.5–35.7)
MCV: 98 fL — ABNORMAL HIGH (ref 79–97)
Platelets: 268 10*3/uL (ref 150–450)
RBC: 3.41 x10E6/uL — ABNORMAL LOW (ref 3.77–5.28)
RDW: 14.2 % (ref 11.7–15.4)
WBC: 9.3 10*3/uL (ref 3.4–10.8)

## 2021-12-09 ENCOUNTER — Telehealth: Payer: Self-pay | Admitting: *Deleted

## 2021-12-09 MED ORDER — FUROSEMIDE 40 MG PO TABS: 40.0000 mg | ORAL_TABLET | Freq: Every day | ORAL | 3 refills | Status: AC

## 2021-12-09 NOTE — Telephone Encounter (Signed)
-----   Message from Nuala Alpha, LPN sent at [phone]/91/3685  8:13 AM EDT ----- ? ?----- Message ----- ?From: Liliane Shi, PA-C ?Sent: 12/09/2021   7:13 AM EDT ?To: [facility] Triage ? ?Hgb normal.  Creatinine increased.  K+ normal.  LFTs normal. Lipids optimal.  ?PLAN:  ?-Decrease Lasix to 40 mg once daily  ?-Otherwise, Continue current medications/treatment plan and follow up as scheduled.  ?Richardson Dopp, PA-C    ?12/09/2021 7:11 AM   ? ?

## 2021-12-10 ENCOUNTER — Telehealth: Payer: Self-pay | Admitting: *Deleted

## 2021-12-10 NOTE — Telephone Encounter (Signed)
Right Heart Cath scheduled at [facility] for:  Wednesday Dec 11, 2021 2:30 PM ?Arrival time and place: [facility] Main Entrance A at: 12:30 PM ? ? ?Nothing to eat after midnight prior to procedure, clear liquids until 5 AM day of procedure. ? ?Medication instructions: ?-Hold: ? Lasix-AM of procedure  ?-Except hold medication usual morning medications can be taken with sips of water. ? ?Confirmed patient has responsible adult to drive home post procedure and be with patient first 24 hours after arriving home. ? ?Patient reports no new symptoms concerning for COVID-19/no exposure to COVID-19 in the past 10 days. ? ?Reviewed procedure instructions with patient.  ? ?

## 2021-12-11 ENCOUNTER — Other Ambulatory Visit: Payer: Self-pay

## 2021-12-11 ENCOUNTER — Encounter (HOSPITAL_COMMUNITY)
Admission: RE | Disposition: A | Discharge status: Home / Self Care | Payer: Self-pay | Source: Home / Self Care | Attending: Cardiovascular Disease

## 2021-12-11 ENCOUNTER — Ambulatory Visit (HOSPITAL_COMMUNITY)
Admission: RE | Admit: 2021-12-11 | Discharge: 2021-12-11 | Disposition: A | Discharge status: Home / Self Care | Payer: Medicare Other | Attending: Cardiovascular Disease | Admitting: Cardiovascular Disease

## 2021-12-11 DIAGNOSIS — Z955 Presence of coronary angioplasty implant and graft: Secondary | ICD-10-CM | POA: Diagnosis not present

## 2021-12-11 DIAGNOSIS — N1832 Chronic kidney disease, stage 3b: Secondary | ICD-10-CM | POA: Insufficient documentation

## 2021-12-11 DIAGNOSIS — I25119 Atherosclerotic heart disease of native coronary artery with unspecified angina pectoris: Secondary | ICD-10-CM | POA: Insufficient documentation

## 2021-12-11 DIAGNOSIS — Z7982 Long term (current) use of aspirin: Secondary | ICD-10-CM | POA: Insufficient documentation

## 2021-12-11 DIAGNOSIS — Z79899 Other long term (current) drug therapy: Secondary | ICD-10-CM | POA: Insufficient documentation

## 2021-12-11 DIAGNOSIS — I252 Old myocardial infarction: Secondary | ICD-10-CM | POA: Insufficient documentation

## 2021-12-11 DIAGNOSIS — I5022 Chronic systolic (congestive) heart failure: Secondary | ICD-10-CM | POA: Diagnosis not present

## 2021-12-11 DIAGNOSIS — J449 Chronic obstructive pulmonary disease, unspecified: Secondary | ICD-10-CM | POA: Diagnosis not present

## 2021-12-11 DIAGNOSIS — R0600 Dyspnea, unspecified: Secondary | ICD-10-CM | POA: Diagnosis not present

## 2021-12-11 DIAGNOSIS — E785 Hyperlipidemia, unspecified: Secondary | ICD-10-CM | POA: Insufficient documentation

## 2021-12-11 DIAGNOSIS — I255 Ischemic cardiomyopathy: Secondary | ICD-10-CM | POA: Insufficient documentation

## 2021-12-11 DIAGNOSIS — I130 Hypertensive heart and chronic kidney disease with heart failure and stage 1 through stage 4 chronic kidney disease, or unspecified chronic kidney disease: Secondary | ICD-10-CM | POA: Insufficient documentation

## 2021-12-11 DIAGNOSIS — Z95810 Presence of automatic (implantable) cardiac defibrillator: Secondary | ICD-10-CM | POA: Insufficient documentation

## 2021-12-11 DIAGNOSIS — Z87891 Personal history of nicotine dependence: Secondary | ICD-10-CM | POA: Diagnosis not present

## 2021-12-11 DIAGNOSIS — I5020 Unspecified systolic (congestive) heart failure: Secondary | ICD-10-CM

## 2021-12-11 DIAGNOSIS — I712 Thoracic aortic aneurysm, without rupture, unspecified: Secondary | ICD-10-CM | POA: Insufficient documentation

## 2021-12-11 HISTORY — PX: RIGHT HEART CATH: CATH118263

## 2021-12-11 LAB — POCT I-STAT EG7
Acid-base deficit: 5 mmol/L — ABNORMAL HIGH (ref 0.0–2.0)
Acid-base deficit: 6 mmol/L — ABNORMAL HIGH (ref 0.0–2.0)
Bicarbonate: 20.3 mmol/L (ref 20.0–28.0)
Bicarbonate: 20.8 mmol/L (ref 20.0–28.0)
Calcium, Ion: 1.3 mmol/L (ref 1.15–1.40)
Calcium, Ion: 1.3 mmol/L (ref 1.15–1.40)
HCT: 33 % — ABNORMAL LOW (ref 36.0–46.0)
HCT: 33 % — ABNORMAL LOW (ref 36.0–46.0)
Hemoglobin: 11.2 g/dL — ABNORMAL LOW (ref 12.0–15.0)
Hemoglobin: 11.2 g/dL — ABNORMAL LOW (ref 12.0–15.0)
O2 Saturation: 62 %
O2 Saturation: 63 %
Potassium: 3.8 mmol/L (ref 3.5–5.1)
Potassium: 3.8 mmol/L (ref 3.5–5.1)
Sodium: 141 mmol/L (ref 135–145)
Sodium: 141 mmol/L (ref 135–145)
TCO2: 22 mmol/L (ref 22–32)
TCO2: 22 mmol/L (ref 22–32)
pCO2, Ven: 40 mmHg — ABNORMAL LOW (ref 44–60)
pCO2, Ven: 42.1 mmHg — ABNORMAL LOW (ref 44–60)
pH, Ven: 7.302 (ref 7.25–7.43)
pH, Ven: 7.314 (ref 7.25–7.43)
pO2, Ven: 35 mmHg (ref 32–45)
pO2, Ven: 36 mmHg (ref 32–45)

## 2021-12-11 SURGERY — RIGHT HEART CATH

## 2021-12-11 MED ORDER — SODIUM CHLORIDE 0.9 % IV SOLN
250.0000 mL | INTRAVENOUS | Status: DC | PRN
Start: 2021-12-11 — End: 2021-12-11

## 2021-12-11 MED ORDER — FENTANYL CITRATE (PF) 100 MCG/2ML IJ SOLN
INTRAMUSCULAR | Status: DC | PRN
  Administered 2021-12-11: 25 ug via INTRAVENOUS

## 2021-12-11 MED ORDER — SODIUM CHLORIDE 0.9 % IV SOLN: INTRAVENOUS | Status: DC

## 2021-12-11 MED ORDER — SODIUM CHLORIDE 0.9% FLUSH: 3.0000 mL | Freq: Two times a day (BID) | INTRAVENOUS | Status: DC

## 2021-12-11 MED ORDER — ONDANSETRON HCL 4 MG/2ML IJ SOLN: 4.0000 mg | Freq: Four times a day (QID) | INTRAMUSCULAR | Status: DC | PRN

## 2021-12-11 MED ORDER — LIDOCAINE HCL (PF) 1 % IJ SOLN
INTRAMUSCULAR | Status: AC
  Filled 2021-12-11: qty 30

## 2021-12-11 MED ORDER — MIDAZOLAM HCL 2 MG/2ML IJ SOLN
INTRAMUSCULAR | Status: AC
  Filled 2021-12-11: qty 2

## 2021-12-11 MED ORDER — LABETALOL HCL 5 MG/ML IV SOLN: 10.0000 mg | INTRAVENOUS | Status: DC | PRN

## 2021-12-11 MED ORDER — ACETAMINOPHEN 325 MG PO TABS: 650.0000 mg | ORAL_TABLET | ORAL | Status: DC | PRN

## 2021-12-11 MED ORDER — HEPARIN (PORCINE) IN NACL 1000-0.9 UT/500ML-% IV SOLN
INTRAVENOUS | Status: AC
  Filled 2021-12-11: qty 500

## 2021-12-11 MED ORDER — HYDRALAZINE HCL 20 MG/ML IJ SOLN: 10.0000 mg | INTRAMUSCULAR | Status: DC | PRN

## 2021-12-11 MED ORDER — HEPARIN (PORCINE) IN NACL 1000-0.9 UT/500ML-% IV SOLN
INTRAVENOUS | Status: DC | PRN
  Administered 2021-12-11: 500 mL

## 2021-12-11 MED ORDER — SODIUM CHLORIDE 0.9% FLUSH: 3.0000 mL | INTRAVENOUS | Status: DC | PRN

## 2021-12-11 MED ORDER — LIDOCAINE HCL (PF) 1 % IJ SOLN
INTRAMUSCULAR | Status: DC | PRN
  Administered 2021-12-11: 2 mL

## 2021-12-11 MED ORDER — FENTANYL CITRATE (PF) 100 MCG/2ML IJ SOLN
INTRAMUSCULAR | Status: AC
  Filled 2021-12-11: qty 2

## 2021-12-11 MED ORDER — ASPIRIN 81 MG PO CHEW: 81.0000 mg | CHEWABLE_TABLET | ORAL | Status: DC

## 2021-12-11 MED ORDER — MIDAZOLAM HCL 2 MG/2ML IJ SOLN
INTRAMUSCULAR | Status: DC | PRN
  Administered 2021-12-11: 1 mg via INTRAVENOUS

## 2021-12-11 SURGICAL SUPPLY — 6 items
CATH BALLN WEDGE 5F 110CM (CATHETERS) ×1 IMPLANT
PACK CARDIAC CATHETERIZATION (CUSTOM PROCEDURE TRAY) ×1 IMPLANT
SHEATH GLIDE SLENDER 4/5FR (SHEATH) ×1 IMPLANT
TRANSDUCER W/STOPCOCK (MISCELLANEOUS) ×1 IMPLANT
TUBING ART PRESS 72  MALE/FEM (TUBING) ×2
TUBING ART PRESS 72 MALE/FEM (TUBING) IMPLANT

## 2021-12-11 NOTE — Interval H&P Note (Signed)
History and Physical Interval Note: ? ?12/11/2021 ?1:53 PM ? ?[name]  has presented today for surgery, with the diagnosis of heart failure.  The various methods of treatment have been discussed with the patient and family. After consideration of risks, benefits and other options for treatment, the patient has consented to  Procedure(s): ?RIGHT HEART CATH (N/A) as a surgical intervention.  The patient's history has been reviewed, patient examined, no change in status, stable for surgery.  I have reviewed the patient's chart and labs.  Questions were answered to the patient's satisfaction.   ? ? ?[name] ? ? ?

## 2021-12-12 ENCOUNTER — Encounter (HOSPITAL_COMMUNITY): Payer: Self-pay | Admitting: Cardiovascular Disease

## 2021-12-13 ENCOUNTER — Ambulatory Visit (INDEPENDENT_AMBULATORY_CARE_PROVIDER_SITE_OTHER): Payer: Medicare Other

## 2021-12-13 DIAGNOSIS — Z0000 Encounter for general adult medical examination without abnormal findings: Secondary | ICD-10-CM

## 2021-12-13 NOTE — Patient Instructions (Signed)
[name] , Thank you for taking time to come for your Medicare Wellness Visit. I appreciate your ongoing commitment to your health goals. Please review the following plan we discussed and let me know if I can assist you in the future.   Screening recommendations/referrals: Colonoscopy: 04/29/2016 Mammogram: declined  Bone Density: declined  Recommended yearly ophthalmology/optometry visit for glaucoma screening and checkup Recommended yearly dental visit for hygiene and checkup  Vaccinations: Influenza vaccine: completed  Pneumococcal vaccine: completed  Tdap vaccine: due  Shingles vaccine: will consider     Advanced directives: none   Conditions/risks identified: none   Next appointment: none    Preventive Care 65 Years and Older, Female Preventive care refers to lifestyle choices and visits with your health care provider that can promote health and wellness. What does preventive care include? A yearly physical exam. This is also called an annual well check. Dental exams once or twice a year. Routine eye exams. Ask your health care provider how often you should have your eyes checked. Personal lifestyle choices, including: Daily care of your teeth and gums. Regular physical activity. Eating a healthy diet. Avoiding tobacco and drug use. Limiting alcohol use. Practicing safe sex. Taking low-dose aspirin every day. Taking vitamin and mineral supplements as recommended by your health care provider. What happens during an annual well check? The services and screenings done by your health care provider during your annual well check will depend on your age, overall health, lifestyle risk factors, and family history of disease. Counseling  Your health care provider may ask you questions about your: Alcohol use. Tobacco use. Drug use. Emotional well-being. Home and relationship well-being. Sexual activity. Eating habits. History of falls. Memory and ability to understand  (cognition). Work and work Statistician. Reproductive health. Screening  You may have the following tests or measurements: Height, weight, and BMI. Blood pressure. Lipid and cholesterol levels. These may be checked every 5 years, or more frequently if you are over 23 years old. Skin check. Lung cancer screening. You may have this screening every year starting at age 36 if you have a 30-pack-year history of smoking and currently smoke or have quit within the past 15 years. Fecal occult blood test (FOBT) of the stool. You may have this test every year starting at age 41. Flexible sigmoidoscopy or colonoscopy. You may have a sigmoidoscopy every 5 years or a colonoscopy every 10 years starting at age 70. Hepatitis C blood test. Hepatitis B blood test. Sexually transmitted disease (STD) testing. Diabetes screening. This is done by checking your blood sugar (glucose) after you have not eaten for a while (fasting). You may have this done every 1-3 years. Bone density scan. This is done to screen for osteoporosis. You may have this done starting at age 28. Mammogram. This may be done every 1-2 years. Talk to your health care provider about how often you should have regular mammograms. Talk with your health care provider about your test results, treatment options, and if necessary, the need for more tests. Vaccines  Your health care provider may recommend certain vaccines, such as: Influenza vaccine. This is recommended every year. Tetanus, diphtheria, and acellular pertussis (Tdap, Td) vaccine. You may need a Td booster every 10 years. Zoster vaccine. You may need this after age 62. Pneumococcal 13-valent conjugate (PCV13) vaccine. One dose is recommended after age 33. Pneumococcal polysaccharide (PPSV23) vaccine. One dose is recommended after age 63. Talk to your health care provider about which screenings and vaccines you need  and how often you need them. This information is not intended to  replace advice given to you by your health care provider. Make sure you discuss any questions you have with your health care provider. Document Released: 08/10/2015 Document Revised: 04/02/2016 Document Reviewed: 05/15/2015 Elsevier Interactive Patient Education  2017 [employer] Prevention in the Home Falls can cause injuries. They can happen to people of all ages. There are many things you can do to make your home safe and to help prevent falls. What can I do on the outside of my home? Regularly fix the edges of walkways and driveways and fix any cracks. Remove anything that might make you trip as you walk through a door, such as a raised step or threshold. Trim any bushes or trees on the path to your home. Use bright outdoor lighting. Clear any walking paths of anything that might make someone trip, such as rocks or tools. Regularly check to see if handrails are loose or broken. Make sure that both sides of any steps have handrails. Any raised decks and porches should have guardrails on the edges. Have any leaves, snow, or ice cleared regularly. Use sand or salt on walking paths during winter. Clean up any spills in your garage right away. This includes oil or grease spills. What can I do in the bathroom? Use night lights. Install grab bars by the toilet and in the tub and shower. Do not use towel bars as grab bars. Use non-skid mats or decals in the tub or shower. If you need to sit down in the shower, use a plastic, non-slip stool. Keep the floor dry. Clean up any water that spills on the floor as soon as it happens. Remove soap buildup in the tub or shower regularly. Attach bath mats securely with double-sided non-slip rug tape. Do not have throw rugs and other things on the floor that can make you trip. What can I do in the bedroom? Use night lights. Make sure that you have a light by your bed that is easy to reach. Do not use any sheets or blankets that are too big for  your bed. They should not hang down onto the floor. Have a firm chair that has side arms. You can use this for support while you get dressed. Do not have throw rugs and other things on the floor that can make you trip. What can I do in the kitchen? Clean up any spills right away. Avoid walking on wet floors. Keep items that you use a lot in easy-to-reach places. If you need to reach something above you, use a strong step stool that has a grab bar. Keep electrical cords out of the way. Do not use floor polish or wax that makes floors slippery. If you must use wax, use non-skid floor wax. Do not have throw rugs and other things on the floor that can make you trip. What can I do with my stairs? Do not leave any items on the stairs. Make sure that there are handrails on both sides of the stairs and use them. Fix handrails that are broken or loose. Make sure that handrails are as long as the stairways. Check any carpeting to make sure that it is firmly attached to the stairs. Fix any carpet that is loose or worn. Avoid having throw rugs at the top or bottom of the stairs. If you do have throw rugs, attach them to the floor with carpet tape. Make sure that you  have a light switch at the top of the stairs and the bottom of the stairs. If you do not have them, ask someone to add them for you. What else can I do to help prevent falls? Wear shoes that: Do not have high heels. Have rubber bottoms. Are comfortable and fit you well. Are closed at the toe. Do not wear sandals. If you use a stepladder: Make sure that it is fully opened. Do not climb a closed stepladder. Make sure that both sides of the stepladder are locked into place. Ask someone to hold it for you, if possible. Clearly mark and make sure that you can see: Any grab bars or handrails. First and last steps. Where the edge of each step is. Use tools that help you move around (mobility aids) if they are needed. These  include: Canes. Walkers. Scooters. Crutches. Turn on the lights when you go into a dark area. Replace any light bulbs as soon as they burn out. Set up your furniture so you have a clear path. Avoid moving your furniture around. If any of your floors are uneven, fix them. If there are any pets around you, be aware of where they are. Review your medicines with your doctor. Some medicines can make you feel dizzy. This can increase your chance of falling. Ask your doctor what other things that you can do to help prevent falls. This information is not intended to replace advice given to you by your health care provider. Make sure you discuss any questions you have with your health care provider. Document Released: 05/10/2009 Document Revised: 12/20/2015 Document Reviewed: 08/18/2014 Elsevier Interactive Patient Education  2017 [employer].

## 2021-12-13 NOTE — Progress Notes (Signed)
Subjective:   [name] is a 76 y.o. female who presents for Medicare Annual (Subsequent) preventive examination.   I connected with [name] today by telephone and verified that I am speaking with the correct person using two identifiers. Location patient: home Location provider: work Persons participating in the virtual visit: patient, provider.   I discussed the limitations, risks, security and privacy concerns of performing an evaluation and management service by telephone and the availability of in person appointments. I also discussed with the patient that there may be a patient responsible charge related to this service. The patient expressed understanding and verbally consented to this telephonic visit.    Interactive audio and video telecommunications were attempted between this provider and patient, however failed, due to patient having technical difficulties OR patient did not have access to video capability.  We continued and completed visit with audio only.    Review of Systems     Cardiac Risk Factors include: advanced age (>24mn, >>71women)     Objective:    Today's Vitals   There is no height or weight on file to calculate BMI.     12/13/2021    1:41 PM 12/11/2021   10:57 AM 04/04/2021   11:57 AM 02/08/2020   10:04 AM 01/31/2019    3:26 PM 04/01/2018    4:00 AM 03/26/2018    1:28 PM  Advanced Directives  Does Patient Have a Medical Advance Directive? No No No No No No No  Would patient like information on creating a medical advance directive? No - Patient declined No - Patient declined No - Patient declined Yes (ED - Information included in AVS)  No - Patient declined No - Patient declined    Current Medications (verified) Outpatient Encounter Medications as of 12/13/2021  Medication Sig   acetaminophen (TYLENOL) 500 MG tablet Take 1,000 mg by mouth every 6 (six) hours as needed (back pain.).   amitriptyline (ELAVIL) 50 MG tablet Take 1 tablet (50 mg  total) by mouth at bedtime.   aspirin 81 MG EC tablet Take 81 mg by mouth daily in the afternoon.   bisoprolol (ZEBETA) 5 MG tablet TAKE 1 TABLET BY MOUTH DAILY. (Patient taking differently: Take 5 mg by mouth daily in the afternoon.)   dexlansoprazole (DEXILANT) 60 MG capsule Take 1 capsule (60 mg total) by mouth daily. (Patient taking differently: Take 60 mg by mouth every evening.)   dicyclomine (BENTYL) 10 MG capsule Take 1 capsule (10 mg total) by mouth every 6 (six) hours as needed for spasms.   furosemide (LASIX) 40 MG tablet Take 1 tablet (40 mg total) by mouth daily.   hydrALAZINE (APRESOLINE) 10 MG tablet Take 1 tablet (10 mg total) by mouth 3 (three) times daily.   isosorbide mononitrate (IMDUR) 30 MG 24 hr tablet Take 0.5 tablets (15 mg total) by mouth daily. (Patient taking differently: Take 15 mg by mouth daily at 12 noon.)   polyethylene glycol powder (GLYCOLAX/MIRALAX) powder Take 17 g by mouth daily as needed for mild constipation.   PROAIR HFA 108 (90 Base) MCG/ACT inhaler 1 puff daily as needed.   rosuvastatin (CRESTOR) 20 MG tablet TAKE 1 TABLET (20 MG TOTAL) BY MOUTH AT BEDTIME. PLEASE MAKE YEARLY APPT WITH DR. CBurt KnackFOR OCTOBER 2022 FOR FUTURE REFILLS.   topiramate (TOPAMAX) 50 MG tablet Take 1 tablet (50 mg total) by mouth daily. (Patient taking differently: Take 50 mg by mouth at bedtime.)   nitroGLYCERIN (NITROSTAT) 0.4 MG SL  tablet Place 1 tablet (0.4 mg total) under the tongue every 5 (five) minutes as needed for chest pain. (Patient not taking: Reported on 12/06/2021)   No facility-administered encounter medications on file as of 12/13/2021.    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 followed by Dr. Prescott Gum.   Arthritis    Asthma    Benign neoplasm of colon    CAD (coronary artery disease) 02/2009   a. anterior STEMI rx with BMS to prox LAD in 02/2009. b. ISR s/p PTCA 06/2009. c. ISR s/p thrombectomy & PTCA 03/2010 due to late stent thrombosis. // Myoview 04/2019: EF 28, ant, ant-sept, inf-sept scar, no ischemia, high risk (stable>>cont med Rx)    Cardiomyopathy, ischemic 06/12/2011   Chronic renal insufficiency    stage 3   Chronic systolic CHF (congestive heart failure) ([place])    a. s/p ST. Jude ICD 2011-10-12.   Chronic systolic heart failure ([place]) 08/05/2011   Colon polyp    Complication of anesthesia    COPD (chronic obstructive pulmonary disease) ([place])    CORONARY ARTERY DISEASE 04/24/2009   Qualifier: Diagnosis of  By: Shane Crutch, Amy S    DDD (degenerative disc disease), lumbar 05/16/2011   Depression    "since my son died in 11/12/2022" 2017/10/11)   Diverticulosis    DIVERTICULOSIS-COLON 08/14/2009   Qualifier: Diagnosis of  By: Trellis Paganini PA-c, Amy S    DYSPHAGIA UNSPECIFIED 04/24/2009   Qualifier: Diagnosis of  By: Laney Potash, Pam     Dyspnea    "when I lay down at night"    Essential hypertension 09/05/2009   Qualifier: Diagnosis of  By: Harvest Dark CMA, Jennifer     Gastric polyp    GERD 10/19/2007   Qualifier: Diagnosis of  By: Rennie Plowman RN, Blanca Friend: Diagnosis of  By: Laney Potash, Pam     GERD (gastroesophageal reflux disease)    GI bleed    a. h/o GIB on DAPT, now on ASA only.   Headache 01/03/2013   Hearing loss    left ear   History of pulmonary embolus (PE) 01/02/2011   Hyperlipemia    Hyperlipidemia, unspecified 04/24/2009   Qualifier: Diagnosis of  By: Trellis Paganini PA-c, Amy S    Hypertension    ICD-St.Jude 08/06/2011   S/p St. Jude ICD placement 08/05/11    Insomnia    Irritable bowel syndrome    Ischemic cardiomyopathy    EF 10-15%   Memory loss     Migraine headache without aura    Myocardial infarct ([place]) 2009/10/11 x 2   Dr. 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[place]) 05/05/2012   Pulmonary embolism ([place]) 11-Oct-2009   Stroke [facility])    'When I was young." no residual   Thoracic aortic aneurysm ([place]) 03/31/2018   TOBACCO ABUSE 04/04/2009   Qualifier: Diagnosis of  By: Arvid Right  Qualifier: Diagnosis of  By: [name], MD, Jaquelyn Bitter    Transfusion history    ?'12 or '13   UNSPECIFIED ANEMIA 07/03/2010   Qualifier: Diagnosis of  By: Shelda Pal     Unspecified mastoiditis    Past Surgical History:  Procedure Laterality Date   ANGIOPLASTY  07/02/09, 04/01/10   BILATERAL SALPINGOOPHORECTOMY     CAD( bare metal stent)  02/2009   x 1   CAROTID-SUBCLAVIAN BYPASS GRAFT Left 03/31/2018   Procedure: LEFT SUBCLAVIAN ARTERY BYPASS GRAFT;  Surgeon: Serafina Mitchell, MD;  Location: [facility];  Service: Vascular;  Laterality: Left;   CERVICAL SPINE SURGERY  08/08   COLONOSCOPY WITH PROPOFOL N/A 04/29/2016   Procedure: COLONOSCOPY WITH PROPOFOL;  Surgeon: [name], MD;  Location: [facility] ENDOSCOPY;  Service: Gastroenterology;  Laterality: N/A;   ESOPHAGOGASTRODUODENOSCOPY N/A 12/09/2016   Procedure: ESOPHAGOGASTRODUODENOSCOPY (EGD);  Surgeon: [name], MD;  Location: [facility];  Service: Gastroenterology;  Laterality: N/A;   ESOPHAGOGASTRODUODENOSCOPY (EGD) WITH PROPOFOL N/A 04/29/2016   Procedure: ESOPHAGOGASTRODUODENOSCOPY (EGD) WITH PROPOFOL;  Surgeon: [name], MD;  Location: [facility] ENDOSCOPY;  Service: Gastroenterology;  Laterality: N/A;   EUS N/A 05/15/2016   Procedure: UPPER ENDOSCOPIC ULTRASOUND (EUS) RADIAL;  Surgeon: Milus Banister, MD;   Location: [facility] ENDOSCOPY;  Service: Endoscopy;  Laterality: N/A;   IMPLANTABLE CARDIOVERTER DEFIBRILLATOR IMPLANT N/A 08/05/2011   Primary prevention SJM ICD implanted,  Analyze ST study patient   INNER EAR SURGERY     left x 17   LUMBAR DISC SURGERY  02/2008   fusion   NASAL SEPTUM SURGERY     RIGHT HEART CATH N/A 12/11/2021   Procedure: RIGHT HEART CATH;  Surgeon: [name], MD;  Location: [facility];  Service: Cardiovascular;  Laterality: N/A;   THORACIC AORTIC ENDOVASCULAR STENT GRAFT N/A 03/31/2018   Procedure: THORACIC AORTIC ENDOVASCULAR STENT GRAFT;  Surgeon: Serafina Mitchell, MD;  Location: [facility] OR;  Service: Vascular;  Laterality: N/A;   TOTAL ABDOMINAL HYSTERECTOMY     complete   Family History  Problem Relation Age of Onset   Colon cancer Mother    Other Brother        tumor in his brain   Bladder Cancer Brother    Throat cancer Brother    Cancer Son        kidney, spread to his liver   Breast cancer Cousin    Social History   Socioeconomic History   Marital status: Divorced    Spouse name: Not on file   Number of children: 2   Years of education: 11   Highest education level: Not on file  Occupational History   Occupation: Merchandiser, retail: UNEMPLOYED    Employer: DISABLED  Tobacco Use   Smoking status: Former    Packs/day: 0.13    Years: 30.00    Pack years: 3.90    Types: Cigarettes   Smokeless tobacco: Never  Vaping Use   Vaping Use: Never used  Substance and Sexual Activity   Alcohol use: No    Alcohol/week: 0.0 standard drinks   Drug use: No   Sexual activity: Not on file  Other Topics Concern   Not on file  Social History Narrative   Pt lives in [place] alone.  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   Patient has 11 th grade education.Right handed.   Caffeine- one cup daily  Social Determinants of Health   Financial Resource Strain: Low Risk    Difficulty of Paying Living Expenses: Not hard at all  Food  Insecurity: No Food Insecurity   Worried About Charity fundraiser in the Last Year: Never true   [place] in the Last Year: Never true  Transportation Needs: No Transportation Needs   Lack of Transportation (Medical): No   Lack of Transportation (Non-Medical): No  Physical Activity: Inactive   Days of Exercise per Week: 0 days   Minutes of Exercise per Session: 0 min  Stress: No Stress Concern Present   Feeling of Stress : Not at all  Social Connections: Moderately Isolated   Frequency of Communication with Friends and Family: Three times a week   Frequency of Social Gatherings with Friends and Family: Three times a week   Attends Religious Services: 1 to 4 times per year   Active Member of Clubs or Organizations: No   Attends Archivist Meetings: Never   Marital Status: Divorced    Tobacco Counseling Counseling given: Not Answered   Clinical Intake:  Pre-visit preparation completed: Yes  Pain : No/denies pain     Nutritional Risks: None Diabetes: No  How often do you need to have someone help you when you read instructions, pamphlets, or other written materials from your doctor or pharmacy?: 1 - Never What is the last grade level you completed in school?: 11th Grade  Diabetic?no   Interpreter Needed?: No  Information entered by :: [place]   Activities of Daily Living    12/13/2021    1:54 PM  In your present state of health, do you have any difficulty performing the following activities:  Hearing? 0  Vision? 0  Difficulty concentrating or making decisions? 0  Walking or climbing stairs? 0  Dressing or bathing? 0  Doing errands, shopping? 0  Preparing Food and eating ? N  Using the Toilet? N  In the past six months, have you accidently leaked urine? N  Do you have problems with loss of bowel control? N  Managing your Medications? N  Managing your Finances? N  Housekeeping or managing your Housekeeping? N    Patient Care  Team: Horald Pollen, MD as PCP - General (Internal Medicine) [name], MD as PCP - Cardiology (Cardiology) Thompson Grayer, MD as PCP - Electrophysiology (Cardiology) Dahlia Byes, MD as Attending Physician (Cardiothoracic Surgery) Kary Kos, MD (Neurosurgery) Rozetta Nunnery, MD (Inactive) (Otolaryngology) Love, Alyson Locket, MD (Neurology) Thompson Grayer, MD as Consulting Physician (Cardiology) Armbruster, Carlota Raspberry, MD as Consulting Physician (Gastroenterology)  Indicate any recent Medical Services you may have received from other than [facility] providers in the past year (date may be approximate).     Assessment:   This is a routine wellness examination for [place].  Hearing/Vision screen Vision Screening - Comments:: Declined eye exams   Dietary issues and exercise activities discussed: Current Exercise Habits: The patient does not participate in regular exercise at present, Exercise limited by: None identified   Goals Addressed   None    Depression Screen    12/13/2021    1:42 PM 12/13/2021    1:39 PM 11/21/2021    1:44 PM 03/27/2021    1:34 PM 10/04/2020    3:15 PM 04/06/2020    4:45 PM 02/08/2020   10:06 AM  PHQ 2/9 Scores  PHQ - 2 Score 0 0 0 0 0 0 0    Fall Risk  12/13/2021    1:42 PM 11/21/2021    1:44 PM 03/27/2021    1:34 PM 10/04/2020    3:15 PM 04/06/2020    4:45 PM  [place] in the past year? 0 0 0 1 0  Number falls in past yr: 0  0 1 0  Injury with Fall? 0  0 1 0  Follow up Falls evaluation completed   Falls evaluation completed Falls evaluation completed    [place]:  Any stairs in or around the home? No  If so, are there any without handrails? No  Home free of loose throw rugs in walkways, pet beds, electrical cords, etc? Yes  Adequate lighting in your home to reduce risk of falls? Yes   ASSISTIVE DEVICES UTILIZED TO PREVENT FALLS:  Life alert? Yes  Use of a cane, walker or w/c? No  Grab  bars in the bathroom? Yes  Shower chair or bench in shower? No  Elevated toilet seat or a handicapped toilet? No     Cognitive Function:  Normal cognitive status assessed by telephone conversation by this Nurse Health Advisor. No abnormalities found.        02/08/2020   10:04 AM 01/31/2019    3:26 PM  6CIT Screen  What Year? 0 points 0 points  What month? 0 points 0 points  What time? 0 points 0 points  Count back from 20 0 points 0 points  Months in reverse 0 points 4 points  Repeat phrase 10 points 0 points  Total Score 10 points 4 points    Immunizations Immunization History  Administered Date(s) Administered   Fluad Quad(high Dose 65+) 04/03/2016, 04/21/2017, 04/06/2020, 03/27/2021   Influenza Nasal 04/27/2017   Influenza Split 08/06/2011   Influenza Whole 04/27/2009   Influenza, High Dose Seasonal PF 04/29/2013, 06/09/2014, 05/12/2015, 04/03/2016, 04/21/2017, 04/17/2018, 04/25/2018, 04/18/2019   Influenza-Unspecified 04/15/2012   Moderna Sars-Covid-2 Vaccination 09/09/2019   Pneumococcal Conjugate-13 06/14/2014, 06/16/2018   Pneumococcal Polysaccharide-23 04/27/2009, 08/06/2011, 04/15/2012, 04/03/2016   Zoster, Live 05/03/2013    TDAP status: Due, Education has been provided regarding the importance of this vaccine. Advised may receive this vaccine at local pharmacy or Health Dept. Aware to provide a copy of the vaccination record if obtained from local pharmacy or Health Dept. Verbalized acceptance and understanding.  Flu Vaccine status: Up to date  Pneumococcal vaccine status: Up to date  Covid-19 vaccine status: Completed vaccines  Qualifies for Shingles Vaccine? Yes   Zostavax completed No   Shingrix Completed?: No.    Education has been provided regarding the importance of this vaccine. Patient has been advised to call insurance company to determine out of pocket expense if they have not yet received this vaccine. Advised may also receive vaccine at local  pharmacy or Health Dept. Verbalized acceptance and understanding.  Screening Tests Health Maintenance  Topic Date Due   TETANUS/TDAP  Never done   Zoster Vaccines- Shingrix (1 of 2) Never done   MAMMOGRAM  03/27/2012   DEXA SCAN  Never done   COVID-19 Vaccine (2 - Moderna series) 10/07/2019   INFLUENZA VACCINE  02/25/2022   COLONOSCOPY (Pts 45-1yr Insurance coverage will need to be confirmed)  04/29/2026   Pneumonia Vaccine 76 Years old  Completed   Hepatitis C Screening  Completed   HPV VACCINES  Aged Out    Health Maintenance  Health Maintenance Due  Topic Date Due   TETANUS/TDAP  Never done   Zoster  Vaccines- Shingrix (1 of 2) Never done   MAMMOGRAM  03/27/2012   DEXA SCAN  Never done   COVID-19 Vaccine (2 - Moderna series) 10/07/2019    Colorectal cancer screening: Type of screening: Colonoscopy. Completed 04/29/2016. Repeat every 10 years  Mammogram status: Ordered patient declines . Pt provided with contact info and advised to call to schedule appt.   Bone Density status: Ordered declined . Pt provided with contact info and advised to call to schedule appt.  Lung Cancer Screening: (Low Dose CT Chest recommended if Age 84-80 years, 30 pack-year currently smoking OR have quit w/in 15years.) does not qualify.   Lung Cancer Screening Referral: n/a  Additional Screening:  Hepatitis C Screening: does not qualify;   Vision Screening: Recommended annual ophthalmology exams for early detection of glaucoma and other disorders of the eye. Is the patient up to date with their annual eye exam?  No  Who is the provider or what is the name of the office in which the patient attends annual eye exams? Patient declines  If pt is not established with a provider, would they like to be referred to a provider to establish care? No .   Dental Screening: Recommended annual dental exams for proper oral hygiene  Community Resource Referral / Chronic Care Management: CRR required this  visit?  No   CCM required this visit?  No      Plan:     I have personally reviewed and noted the following in the patient's chart:   Medical and social history Use of alcohol, tobacco or illicit drugs  Current medications and supplements including opioid prescriptions.  Functional ability and status Nutritional status Physical activity Advanced directives List of other physicians Hospitalizations, surgeries, and ER visits in previous 12 months Vitals Screenings to include cognitive, depression, and falls Referrals and appointments  In addition, I have reviewed and discussed with patient certain preventive protocols, quality metrics, and best practice recommendations. A written personalized care plan for preventive services as well as general preventive health recommendations were provided to patient.     Randel Pigg, LPN   [id number]   Nurse Notes: none

## 2021-12-25 ENCOUNTER — Ambulatory Visit (INDEPENDENT_AMBULATORY_CARE_PROVIDER_SITE_OTHER): Payer: Medicare Other | Admitting: Physician Assistant

## 2021-12-25 ENCOUNTER — Encounter: Payer: Self-pay | Admitting: Physician Assistant

## 2021-12-25 VITALS — BP 122/60 | HR 86 | Ht 62.0 in | Wt 172.8 lb

## 2021-12-25 DIAGNOSIS — I5020 Unspecified systolic (congestive) heart failure: Secondary | ICD-10-CM | POA: Diagnosis not present

## 2021-12-25 DIAGNOSIS — I25119 Atherosclerotic heart disease of native coronary artery with unspecified angina pectoris: Secondary | ICD-10-CM | POA: Diagnosis not present

## 2021-12-25 DIAGNOSIS — N1832 Chronic kidney disease, stage 3b: Secondary | ICD-10-CM | POA: Diagnosis not present

## 2021-12-25 DIAGNOSIS — R0602 Shortness of breath: Secondary | ICD-10-CM

## 2021-12-25 NOTE — Assessment & Plan Note (Signed)
Obtain f/u BMET today.  

## 2021-12-25 NOTE — Assessment & Plan Note (Signed)
Ischemic cardiomyopathy.  EF 25-30.  NYHA III-IIIb.  Overall, her volume status appears stable on exam.  Her renal function prohibits the use of SGLT2 inhibitor, MRA and now ACE/ARB/ARNI.  Recent R cardiac catheterization demonstrated normal pressures and no evidence of pulmonary hypertension.  Continue bisoprolol 5 mg once daily, furosemide 40 mg once daily, isosorbide mono 15 mg once daily, hydralazine 10 mg three times a day.  F/u in 6 mos.

## 2021-12-25 NOTE — Progress Notes (Signed)
Cardiology Office Note:    Date:  12/25/2021   ID:  [name], DOB 1946-06-16, MRN [id number]  PCP:  Horald Pollen, MD  [place] [facility] Cardiologist:  [name], MD Electrophysiologist:  Thompson Grayer, MD    Referring MD: Horald Pollen, *   Chief Complaint:  f/u after heart cath    Patient Profile: Coronary artery disease  S/p anterior MI in 2010 >> PCI: BMS to prox LAD S/p multiple PCIs since MI in 2010 2/2 ISR Myoview 04/2019: Lg scar, no ischemia, EF 28 Chronic systolic CHF Ischemic CM S/p AICD Hx of pulmonary embolism  COPD Chronic kidney disease  Prior Gi bleeding  Hypertension  Hyperlipidemia  Thoracic aortic aneurysm S/p L carotid-subclavian transposition and endovascular repair of aneurysm 03/2018  Aortic atherosclerosis  Prior CV Studies: RIGHT HEART CATH 12/11/2021 Normal right heart hemodynamics with no pulmonary hypertension  RA mean of 6 RV 28/2, RVEDP 6 PA 24/11 mean 19 Pulmonary capillary wedge pressure mean 10  PA oxygen saturation 62% SVC oxygen saturation 63% Fick cardiac output 4.6 L/min, cardiac index 2.6 L/min    Echocardiogram 05/01/2021 No LV thrombus, EF 25-30, G1 DD, apical anteroseptal/apical/lateral AK, normal RVSF, mild RAE   Myoview 04/17/2021  Large infarct, no ischemia, EF 18; high risk   ABI 03/15/18 Normal   Carotid US 09/08/16 bilat 1-39   LHC (06/2010):   prox LAD stent ok with 30-50% ISR, prox to mid RCA 30-40%.  History of Present Illness:   [name] is a 76 y.o. female with the above problem list.  She follows up after recent R cardiac catheterization.  She has continued to have significant dyspnea and we set this up to sort out how much of her shortness of breath is related to cardiac disease.  Her R heart hemodynamics were normal and there was no evidence of pulmonary hypertension.  CO was 4.6.    She is here alone today.  She notes her shortness of breath is unchanged.  She  has shortness of breath with mild to mod activities.  She has not had chest pain, syncope, orthopnea, edema.  She notes significant low back pain.  This is chronic. She has had prior back surgery.      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 followed by Dr. Prescott Gum.   Arthritis    Asthma    Benign neoplasm of colon    CAD (coronary artery disease) 02/2009   a. anterior STEMI rx with BMS to prox LAD in 02/2009. b. ISR s/p PTCA 06/2009. c. ISR s/p thrombectomy & PTCA 03/2010 due to late stent thrombosis. // Myoview 04/2019: EF 28, ant, ant-sept, inf-sept scar, no ischemia, high risk (stable>>cont med Rx)    Cardiomyopathy, ischemic 06/12/2011   Chronic renal insufficiency    stage 3   Chronic systolic CHF (congestive heart failure) ([place])    a. s/p ST. Jude ICD 2013.   Chronic systolic heart failure ([place]) 08/05/2011   Colon polyp    Complication of anesthesia    COPD (chronic obstructive pulmonary disease) ([place])    CORONARY ARTERY DISEASE 04/24/2009   Qualifier: Diagnosis of  By: Shane Crutch, Amy S  DDD (degenerative disc disease), lumbar 05/16/2011   Depression    "since my son died in 11-26-22" 10/25/2017)   Diverticulosis    DIVERTICULOSIS-COLON 08/14/2009   Qualifier: Diagnosis of  By: Trellis Paganini PA-c, Amy S    DYSPHAGIA UNSPECIFIED 04/24/2009   Qualifier: Diagnosis of  By: Laney Potash, Pam     Dyspnea    "when I lay down at night"    Essential hypertension 09/05/2009   Qualifier: Diagnosis of  By: Harvest Dark CMA, Jennifer     Gastric polyp    GERD 10/19/2007   Qualifier: Diagnosis of  By: Rennie Plowman RN, Blanca Friend: Diagnosis of  By: Laney Potash, Pam     GERD (gastroesophageal reflux disease)    GI bleed    a.  h/o GIB on DAPT, now on ASA only.   Headache 01/03/2013   Hearing loss    left ear   History of pulmonary embolus (PE) 01/02/2011   Hyperlipemia    Hyperlipidemia, unspecified 04/24/2009   Qualifier: Diagnosis of  By: Trellis Paganini PA-c, Amy S    Hypertension    ICD-St.Jude 08/06/2011   S/p St. Jude ICD placement 08/05/11    Insomnia    Irritable bowel syndrome    Ischemic cardiomyopathy    EF 10-15%   Memory loss    Migraine headache without aura    Myocardial infarct ([place]) 10-25-09 x 2   Dr. 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[place]) 05/05/2012   Pulmonary embolism ([place]) 10/25/09   Stroke [facility])    'When I was young." no residual   Thoracic aortic aneurysm ([place]) 03/31/2018   TOBACCO ABUSE 04/04/2009   Qualifier: Diagnosis of  By: Arvid Right  Qualifier: Diagnosis of  By: [name], MD, Jaquelyn Bitter    Transfusion history    ?'12 or '13   UNSPECIFIED ANEMIA 07/03/2010   Qualifier: Diagnosis of  By: Shelda Pal     Unspecified mastoiditis    Current Medications: Current Meds  Medication Sig   acetaminophen (TYLENOL) 500 MG tablet Take 1,000 mg by mouth every 6 (six) hours as needed (back pain.).   amitriptyline (ELAVIL) 50 MG tablet Take 1 tablet (50 mg total) by mouth at bedtime.   aspirin 81 MG EC tablet Take 81 mg by mouth daily in the afternoon.   bisoprolol (ZEBETA) 5 MG tablet TAKE 1 TABLET BY MOUTH DAILY.   dexlansoprazole (DEXILANT) 60 MG capsule Take 1 capsule (60 mg total) by mouth daily.   dicyclomine (BENTYL) 10 MG capsule Take 1 capsule (10 mg total) by mouth every 6 (six) hours as needed for spasms.   furosemide (LASIX) 40 MG tablet Take 1 tablet (40 mg total) by mouth daily.   hydrALAZINE (APRESOLINE) 10  MG tablet Take 1 tablet (10 mg total) by mouth 3 (three) times daily.   isosorbide mononitrate (IMDUR) 30 MG 24 hr tablet Take 0.5 tablets (15 mg total) by mouth daily.   nitroGLYCERIN (NITROSTAT) 0.4 MG SL tablet Place 1 tablet (0.4 mg total) under the tongue every 5 (five) minutes as needed for chest pain.   polyethylene glycol powder (GLYCOLAX/MIRALAX) powder Take 17 g by mouth daily as needed for mild constipation.   PROAIR HFA  108 (90 Base) MCG/ACT inhaler 1 puff daily as needed.   rosuvastatin (CRESTOR) 20 MG tablet TAKE 1 TABLET (20 MG TOTAL) BY MOUTH AT BEDTIME. PLEASE MAKE YEARLY APPT WITH DR. Burt Knack FOR OCTOBER 2022 FOR FUTURE REFILLS.   topiramate (TOPAMAX) 50 MG tablet Take 1 tablet (50 mg total) by mouth daily.    Allergies:   Sulfa antibiotics, Sulfonamide derivatives, and Zestril [lisinopril]   Social History   Tobacco Use   Smoking status: Former    Packs/day: 0.13    Years: 30.00    Pack years: 3.90    Types: Cigarettes   Smokeless tobacco: Never  Vaping Use   Vaping Use: Never used  Substance Use Topics   Alcohol use: No    Alcohol/week: 0.0 standard drinks   Drug use: No    Family Hx: The patient's family history includes Bladder Cancer in her brother; Breast cancer in her cousin; Cancer in her son; Colon cancer in her mother; Other in her brother; Throat cancer in her brother.  Review of Systems  Respiratory:  Positive for cough and wheezing.     EKGs/Labs/Other Test Reviewed:    EKG:  EKG is not ordered today.  The ekg ordered today demonstrates n/a  Recent Labs: 04/09/2021: TSH 5.580 05/22/2021: BNP 188.2 08/09/2021: NT-Pro BNP 945 12/06/2021: ALT 13; BUN 28; Creatinine, Ser 2.01; Platelets 268 12/11/2021: Hemoglobin 11.2; Potassium 3.8; Sodium 141   Recent Lipid Panel Recent Labs    12/06/21 0852  CHOL 126  TRIG 122  HDL 51  LDLCALC 53     Risk Assessment/Calculations:         Physical Exam:    VS:  BP 122/60   Pulse 86   Ht '5\' 2"'$  (1.575  m)   Wt 172 lb 12.8 oz (78.4 kg)   SpO2 96%   BMI 31.61 kg/m     Wt Readings from Last 3 Encounters:  12/25/21 172 lb 12.8 oz (78.4 kg)  12/11/21 174 lb (78.9 kg)  12/03/21 175 lb (79.4 kg)    Constitutional:      Appearance: Healthy appearance. Not in distress.  Neck:     Vascular: No JVR. JVD normal.  Pulmonary:     Effort: Pulmonary effort is normal.     Breath sounds: No wheezing. No rales.  Cardiovascular:     Normal rate. Regular rhythm. Normal S1. Normal S2.      Murmurs: There is no murmur.  Edema:    Peripheral edema absent.  Abdominal:     Palpations: Abdomen is soft.  Skin:    General: Skin is warm and dry.  Neurological:     Mental Status: Alert and oriented to person, place and time.     Cranial Nerves: Cranial nerves are intact.        ASSESSMENT & PLAN:   Coronary artery disease involving native coronary artery of native heart with angina pectoris ([place]) History of anterior STEMI in 2010 treated with BMS to the LAD.  She has had multiple PCI procedures since secondary to in-stent restenosis.  Her most recent nuclear stress test demonstrated extensive scar but no ischemia.  She has not been having anginal symptoms.  Continue ASA 81 mg once daily, bisoprolol 5 mg once daily, Rosuvastatin 20 mg once daily.  F/u in 6 mos.   HFrEF (heart failure with reduced ejection fraction) (HCC) Ischemic cardiomyopathy.  EF 25-30.  NYHA III-IIIb.  Overall, her volume status appears stable on exam.  Her renal function prohibits  the use of SGLT2 inhibitor, MRA and now ACE/ARB/ARNI.  Recent R cardiac catheterization demonstrated normal pressures and no evidence of pulmonary hypertension.  Continue bisoprolol 5 mg once daily, furosemide 40 mg once daily, isosorbide mono 15 mg once daily, hydralazine 10 mg three times a day.  F/u in 6 mos.  Stage 3b chronic kidney disease ([place]) Obtain f/u BMET today.  Shortness of breath She had PFTs in 2016 that were abnormal.  She has a hx of  smoking.  As noted, her R cardiac catheterization showed normal R heart hemodynamics and no pulmonary hypertension.  I will refer her to pulmonology for further evaluation of her shortness of breath.           Dispo:  Return in about 6 months (around 06/26/2022) for Routine Follow Up, w/ Dr. Burt Knack, or Richardson Dopp, PA-C.   Medication Adjustments/Labs and Tests Ordered: Current medicines are reviewed at length with the patient today.  Concerns regarding medicines are outlined above.  Tests Ordered: Orders Placed This Encounter  Procedures   Basic metabolic panel   Ambulatory referral to Pulmonology   Medication Changes: No orders of the defined types were placed in this encounter.  Signed, Richardson Dopp, PA-C  12/25/2021 2:42 PM    [facility] [address], [place], [place]  [address] Phone: [phone]; Fax: [phone]

## 2021-12-25 NOTE — Assessment & Plan Note (Signed)
History of anterior STEMI in 2010 treated with BMS to the LAD.  She has had multiple PCI procedures since secondary to in-stent restenosis.  Her most recent nuclear stress test demonstrated extensive scar but no ischemia.  She has not been having anginal symptoms.  Continue ASA 81 mg once daily, bisoprolol 5 mg once daily, Rosuvastatin 20 mg once daily.  F/u in 6 mos.

## 2021-12-25 NOTE — Assessment & Plan Note (Signed)
She had PFTs in 2016 that were abnormal.  She has a hx of smoking.  As noted, her R cardiac catheterization showed normal R heart hemodynamics and no pulmonary hypertension.  I will refer her to pulmonology for further evaluation of her shortness of breath.

## 2021-12-25 NOTE — Patient Instructions (Addendum)
Medication Instructions:  Your physician recommends that you continue on your current medications as directed. Please refer to the Current Medication list given to you today.  *If you need a refill on your cardiac medications before your next appointment, please call your pharmacy*   Lab Work: TODAY:  BMET  If you have labs (blood work) drawn today and your tests are completely normal, you will receive your results only by: [place] (if you have MyChart) OR A paper copy in the mail If you have any lab test that is abnormal or we need to change your treatment, we will call you to review the results.   Testing/Procedures: None ordered  You have been referred to [facility].  They will call you with an appointment.    Follow-Up: At [facility], you and your health needs are our priority.  As part of our continuing mission to provide you with exceptional heart care, we have created designated Provider Care Teams.  These Care Teams include your primary Cardiologist (physician) and Advanced Practice Providers (APPs -  Physician Assistants and Nurse Practitioners) who all work together to provide you with the care you need, when you need it.  We recommend signing up for the patient portal called "MyChart".  Sign up information is provided on this After Visit Summary.  MyChart is used to connect with patients for Virtual Visits (Telemedicine).  Patients are able to view lab/test results, encounter notes, upcoming appointments, etc.  Non-urgent messages can be sent to your provider as well.   To learn more about what you can do with MyChart, go to [email or web address].    Your next appointment:   6 month(s)  06/27/22 ARRIVE AT 1:45  The format for your next appointment:   In Person  Provider:   [name], MD  or Richardson Dopp, PA-C         Other Instructions   Important Information About Sugar

## 2021-12-26 ENCOUNTER — Telehealth: Payer: Self-pay | Admitting: *Deleted

## 2021-12-26 DIAGNOSIS — Z79899 Other long term (current) drug therapy: Secondary | ICD-10-CM

## 2021-12-26 LAB — BASIC METABOLIC PANEL
BUN/Creatinine Ratio: 10 — ABNORMAL LOW (ref 12–28)
BUN: 18 mg/dL (ref 8–27)
CO2: 20 mmol/L (ref 20–29)
Calcium: 9.7 mg/dL (ref 8.7–10.3)
Chloride: 112 mmol/L — ABNORMAL HIGH (ref 96–106)
Creatinine, Ser: 1.83 mg/dL — ABNORMAL HIGH (ref 0.57–1.00)
Glucose: 98 mg/dL (ref 70–99)
Potassium: 4.5 mmol/L (ref 3.5–5.2)
Sodium: 150 mmol/L — ABNORMAL HIGH (ref 134–144)
eGFR: 29 mL/min/{1.73_m2} — ABNORMAL LOW (ref >59)

## 2021-12-26 NOTE — Telephone Encounter (Signed)
-----   Message from Liliane Shi, PA-C sent at 12/26/2021  7:42 AM EDT ----- Creatinine stable.  K+ normal. Sodium level elevated some which may mean she is a little dehydrated.  PLAN:  -Hold Lasix for 2 days then resume 40 mg once daily  -BMET 2 weeks Richardson Dopp, PA-C    12/26/2021 7:39 AM

## 2022-01-02 ENCOUNTER — Ambulatory Visit (INDEPENDENT_AMBULATORY_CARE_PROVIDER_SITE_OTHER): Payer: Medicare Other

## 2022-01-02 DIAGNOSIS — I255 Ischemic cardiomyopathy: Secondary | ICD-10-CM

## 2022-01-02 LAB — CUP PACEART REMOTE DEVICE CHECK
Battery Remaining Longevity: 11 mo
Battery Remaining Percentage: 9 %
Battery Voltage: 2.66 V
Brady Statistic RV Percent Paced: 1 %
Date Time Interrogation Session: 20230608023635
HighPow Impedance: 90 Ohm
HighPow Impedance: 90 Ohm
Implantable Lead Implant Date: 20130108
Implantable Lead Location: 753860
Implantable Pulse Generator Implant Date: 20130108
Lead Channel Impedance Value: 540 Ohm
Lead Channel Pacing Threshold Amplitude: 0.75 V
Lead Channel Pacing Threshold Pulse Width: 0.5 ms
Lead Channel Sensing Intrinsic Amplitude: 12 mV
Lead Channel Setting Pacing Amplitude: 2.5 V
Lead Channel Setting Pacing Pulse Width: 0.5 ms
Lead Channel Setting Sensing Sensitivity: 0.5 mV
Pulse Gen Serial Number: 1016523

## 2022-01-09 ENCOUNTER — Other Ambulatory Visit: Payer: Medicare Other

## 2022-01-09 DIAGNOSIS — Z79899 Other long term (current) drug therapy: Secondary | ICD-10-CM

## 2022-01-09 LAB — BASIC METABOLIC PANEL
BUN/Creatinine Ratio: 15 (ref 12–28)
BUN: 29 mg/dL — ABNORMAL HIGH (ref 8–27)
CO2: 19 mmol/L — ABNORMAL LOW (ref 20–29)
Calcium: 9.4 mg/dL (ref 8.7–10.3)
Chloride: 106 mmol/L (ref 96–106)
Creatinine, Ser: 1.93 mg/dL — ABNORMAL HIGH (ref 0.57–1.00)
Glucose: 146 mg/dL — ABNORMAL HIGH (ref 70–99)
Potassium: 3.6 mmol/L (ref 3.5–5.2)
Sodium: 141 mmol/L (ref 134–144)
eGFR: 27 mL/min/{1.73_m2} — ABNORMAL LOW (ref >59)

## 2022-01-10 NOTE — Progress Notes (Signed)
Remote ICD transmission.   

## 2022-01-20 ENCOUNTER — Encounter: Payer: Self-pay | Admitting: Pulmonary Disease

## 2022-01-20 ENCOUNTER — Ambulatory Visit (INDEPENDENT_AMBULATORY_CARE_PROVIDER_SITE_OTHER): Payer: Medicare Other | Admitting: Pulmonary Disease

## 2022-01-20 VITALS — BP 132/68 | HR 76 | Temp 98.4°F | Ht 62.0 in | Wt 174.6 lb

## 2022-01-20 DIAGNOSIS — R0602 Shortness of breath: Secondary | ICD-10-CM | POA: Diagnosis not present

## 2022-01-20 MED ORDER — STIOLTO RESPIMAT 2.5-2.5 MCG/ACT IN AERS
2.0000 | INHALATION_SPRAY | Freq: Every day | RESPIRATORY_TRACT | 0 refills | Status: DC
Start: 2022-01-20 — End: 2022-04-23

## 2022-01-20 MED ORDER — STIOLTO RESPIMAT 2.5-2.5 MCG/ACT IN AERS: 2.0000 | INHALATION_SPRAY | Freq: Every day | RESPIRATORY_TRACT | 6 refills | Status: DC

## 2022-01-27 ENCOUNTER — Other Ambulatory Visit: Payer: Self-pay

## 2022-01-27 ENCOUNTER — Emergency Department (HOSPITAL_COMMUNITY)
Admission: EM | Admit: 2022-01-27 | Discharge: 2022-01-27 | Disposition: A | Discharge status: Home / Self Care | Payer: Medicare Other | Attending: Emergency Medicine | Admitting: Emergency Medicine

## 2022-01-27 ENCOUNTER — Encounter (HOSPITAL_COMMUNITY): Payer: Self-pay | Admitting: Pharmacy Technician

## 2022-01-27 DIAGNOSIS — I110 Hypertensive heart disease with heart failure: Secondary | ICD-10-CM | POA: Insufficient documentation

## 2022-01-27 DIAGNOSIS — Z79899 Other long term (current) drug therapy: Secondary | ICD-10-CM | POA: Diagnosis not present

## 2022-01-27 DIAGNOSIS — N3000 Acute cystitis without hematuria: Secondary | ICD-10-CM | POA: Diagnosis not present

## 2022-01-27 DIAGNOSIS — J449 Chronic obstructive pulmonary disease, unspecified: Secondary | ICD-10-CM | POA: Diagnosis not present

## 2022-01-27 DIAGNOSIS — I509 Heart failure, unspecified: Secondary | ICD-10-CM | POA: Diagnosis not present

## 2022-01-27 DIAGNOSIS — M5441 Lumbago with sciatica, right side: Secondary | ICD-10-CM | POA: Diagnosis not present

## 2022-01-27 DIAGNOSIS — I2510 Atherosclerotic heart disease of native coronary artery without angina pectoris: Secondary | ICD-10-CM | POA: Diagnosis not present

## 2022-01-27 DIAGNOSIS — G8929 Other chronic pain: Secondary | ICD-10-CM

## 2022-01-27 DIAGNOSIS — Z7982 Long term (current) use of aspirin: Secondary | ICD-10-CM | POA: Diagnosis not present

## 2022-01-27 DIAGNOSIS — M545 Low back pain, unspecified: Secondary | ICD-10-CM | POA: Diagnosis present

## 2022-01-27 DIAGNOSIS — R109 Unspecified abdominal pain: Secondary | ICD-10-CM | POA: Insufficient documentation

## 2022-01-27 LAB — COMPREHENSIVE METABOLIC PANEL
ALT: 14 U/L (ref 0–44)
AST: 19 U/L (ref 15–41)
Albumin: 3.7 g/dL (ref 3.5–5.0)
Alkaline Phosphatase: 93 U/L (ref 38–126)
Anion gap: 7 (ref 5–15)
BUN: 27 mg/dL — ABNORMAL HIGH (ref 8–23)
CO2: 21 mmol/L — ABNORMAL LOW (ref 22–32)
Calcium: 9 mg/dL (ref 8.9–10.3)
Chloride: 113 mmol/L — ABNORMAL HIGH (ref 98–111)
Creatinine, Ser: 1.83 mg/dL — ABNORMAL HIGH (ref 0.44–1.00)
GFR, Estimated: 29 mL/min — ABNORMAL LOW (ref >60)
Glucose, Bld: 107 mg/dL — ABNORMAL HIGH (ref 70–99)
Potassium: 3.9 mmol/L (ref 3.5–5.1)
Sodium: 141 mmol/L (ref 135–145)
Total Bilirubin: 0.7 mg/dL (ref 0.3–1.2)
Total Protein: 6.9 g/dL (ref 6.5–8.1)

## 2022-01-27 LAB — URINALYSIS, ROUTINE W REFLEX MICROSCOPIC
Glucose, UA: 250 mg/dL — AB
Hgb urine dipstick: NEGATIVE
Ketones, ur: NEGATIVE mg/dL
Nitrite: POSITIVE — AB
Protein, ur: >300 mg/dL — AB
Specific Gravity, Urine: 1.015 (ref 1.005–1.030)
pH: 5 (ref 5.0–8.0)

## 2022-01-27 LAB — CBC WITH DIFFERENTIAL/PLATELET
Abs Immature Granulocytes: 0.03 10*3/uL (ref 0.00–0.07)
Basophils Absolute: 0 10*3/uL (ref 0.0–0.1)
Basophils Relative: 0 %
Eosinophils Absolute: 0.2 10*3/uL (ref 0.0–0.5)
Eosinophils Relative: 2 %
HCT: 37.2 % (ref 36.0–46.0)
Hemoglobin: 12.1 g/dL (ref 12.0–15.0)
Immature Granulocytes: 0 %
Lymphocytes Relative: 39 %
Lymphs Abs: 2.8 10*3/uL (ref 0.7–4.0)
MCH: 32.9 pg (ref 26.0–34.0)
MCHC: 32.5 g/dL (ref 30.0–36.0)
MCV: 101.1 fL — ABNORMAL HIGH (ref 80.0–100.0)
Monocytes Absolute: 0.5 10*3/uL (ref 0.1–1.0)
Monocytes Relative: 7 %
Neutro Abs: 3.8 10*3/uL (ref 1.7–7.7)
Neutrophils Relative %: 52 %
Platelets: 212 10*3/uL (ref 150–400)
RBC: 3.68 MIL/uL — ABNORMAL LOW (ref 3.87–5.11)
RDW: 14.8 % (ref 11.5–15.5)
WBC: 7.3 10*3/uL (ref 4.0–10.5)
nRBC: 0 % (ref 0.0–0.2)

## 2022-01-27 LAB — URINALYSIS, MICROSCOPIC (REFLEX)
Bacteria, UA: NONE SEEN
WBC, UA: >50 WBC/hpf (ref 0–5)

## 2022-01-27 MED ORDER — OXYCODONE HCL 5 MG PO TABS
5.0000 mg | ORAL_TABLET | Freq: Once | ORAL | Status: AC
  Administered 2022-01-27: 5 mg via ORAL
  Filled 2022-01-27: qty 1

## 2022-01-27 MED ORDER — LIDOCAINE 4 % EX PTCH: 1.0000 | MEDICATED_PATCH | CUTANEOUS | 0 refills | Status: DC

## 2022-01-27 MED ORDER — CEFPODOXIME PROXETIL 100 MG PO TABS: 100.0000 mg | ORAL_TABLET | Freq: Every day | ORAL | 0 refills | Status: AC

## 2022-01-27 MED ORDER — LIDOCAINE 5 % EX PTCH: 1.0000 | MEDICATED_PATCH | CUTANEOUS | Status: DC

## 2022-01-27 NOTE — Discharge Instructions (Signed)
You were seen in the ER for evaluation of your chronic back pain as well as your urinary pain.  You have a urinary tract infection, for this I placed you on an antibiotic.  Please make sure you are taking this twice daily for the next 7 days.  Additionally, I have given you a follow-up for urology.  Please make sure you are calling them to schedule an appointment.  You can use the lidocaine patches I prescribed you to apply to your back for your chronic back pain needs.  You can also try Tylenol or ibuprofen as needed.  If you have any worsening pain, urinating blood, fever, numbness, tingling, weakness, please return to the nearest emergency department for evaluation.  Contact a health care provider if: Your symptoms do not get better after 1-2 days. Your symptoms go away and then return. Get help right away if: You have severe pain in your back or your lower abdomen. You have a fever or chills. You have nausea or vomiting.  Contact a doctor if: Your pain does not get better with rest or medicine. Your pain gets worse, or you have new pain. You have a high fever. You lose weight very quickly. You have trouble doing your normal activities. Get help right away if: One or both of your legs or feet feel weak. One or both of your legs or feet lose feeling (have numbness). You have trouble controlling when you poop (have a bowel movement) or pee (urinate). You have bad back pain and: You feel like you may vomit (nauseous), or you vomit. You have pain in your belly (abdomen). You have shortness of breath. You faint.

## 2022-01-27 NOTE — ED Provider Notes (Signed)
[facility]PART[facility] Provider Note   CSN: [id number] Arrival date & time: 01/27/22  1037     History Chief Complaint  Patient presents with   Back Pain    [name] is a 76 y.o. female with h/o CAD, CHF, hypertension, COPD presents the emergency department for evaluation of chronic back pain for the past 6 weeks as well as some suprapubic pain and vaginal pain for the past 2 days.  Patient reports that she has some vaginal pain and occasional dysuria.  She has some suprapubic pain.  She reports that she has had this problem before but does not member what they called it.  She reports that she was seen by a "kidney doctor" and was told it was a "old people disease" and was told to take "orange pills".  Patient reports she has been taking the orange pills and has not had much relief of her symptoms.  She is unsure of what the disease was called, unsure if it was called interstitial cystitis.  She is unsure whether she has urgency or frequency given that she takes a water pill and reports goes to the bathroom often.  She denies any blood in her urine.  Denies any fever, nausea, vomiting.  The patient reports that while she was here she wanted to mention her back pain. Patient reports that she normally has chronic back pain but its been worse over the past 6 weeks mainly on the right lower side.  No midline.  She denies any trauma or recent falls.  She has a baseline of some urinary fecal incontinence which she reports has been seen by her back doctor.  She denies any saddle anesthesia.  Denies any radiation of the pain.  She reports her pain exacerbated by palpation and by standing.  Denies any numbness, tingling, or weakness.   Back Pain Associated symptoms: abdominal pain and dysuria   Associated symptoms: no chest pain, no fever, no numbness and no weakness        Home Medications Prior to Admission medications   Medication Sig Start Date End Date Taking?  Authorizing Provider  acetaminophen (TYLENOL) 500 MG tablet Take 1,000 mg by mouth every 6 (six) hours as needed (back pain.).    [provider]  amitriptyline (ELAVIL) 50 MG tablet Take 1 tablet (50 mg total) by mouth at bedtime. 09/24/21   Horald Pollen, MD  aspirin 81 MG EC tablet Take 81 mg by mouth daily in the afternoon.    [provider]  bisoprolol (ZEBETA) 5 MG tablet TAKE 1 TABLET BY MOUTH DAILY. 07/12/21   [name], MD  dexlansoprazole (DEXILANT) 60 MG capsule Take 1 capsule (60 mg total) by mouth daily. 02/19/21   Esterwood, Amy S, PA-C  dicyclomine (BENTYL) 10 MG capsule Take 1 capsule (10 mg total) by mouth every 6 (six) hours as needed for spasms. 11/21/21   Horald Pollen, MD  furosemide (LASIX) 40 MG tablet Take 1 tablet (40 mg total) by mouth daily. 12/09/21   Richardson Dopp T, PA-C  hydrALAZINE (APRESOLINE) 10 MG tablet Take 1 tablet (10 mg total) by mouth 3 (three) times daily. 08/13/21 08/13/22  Richardson Dopp T, PA-C  isosorbide mononitrate (IMDUR) 30 MG 24 hr tablet Take 0.5 tablets (15 mg total) by mouth daily. 08/13/21 08/13/22  Richardson Dopp T, PA-C  nitroGLYCERIN (NITROSTAT) 0.4 MG SL tablet Place 1 tablet (0.4 mg total) under the tongue every 5 (five) minutes as needed  for chest pain. 03/29/19 12/25/21  Richardson Dopp T, PA-C  polyethylene glycol powder (GLYCOLAX/MIRALAX) powder Take 17 g by mouth daily as needed for mild constipation. 07/08/17   Daleen Squibb, MD  PROAIR HFA [phone] Base) MCG/ACT inhaler 1 puff daily as needed. 03/27/21   Horald Pollen, MD  rosuvastatin (CRESTOR) 20 MG tablet TAKE 1 TABLET (20 MG TOTAL) BY MOUTH AT BEDTIME. PLEASE MAKE YEARLY APPT WITH DR. Burt Knack FOR OCTOBER 2022 FOR FUTURE REFILLS. 04/16/21   [name], MD  Tiotropium Bromide-Olodaterol (STIOLTO RESPIMAT) 2.5-2.5 MCG/ACT AERS Inhale 2 puffs into the lungs daily. 01/20/22   Hunsucker, Bonna Gains, MD  Tiotropium Bromide-Olodaterol (STIOLTO  RESPIMAT) 2.5-2.5 MCG/ACT AERS Inhale 2 puffs into the lungs daily. 01/20/22   Hunsucker, Bonna Gains, MD  topiramate (TOPAMAX) 50 MG tablet Take 1 tablet (50 mg total) by mouth daily. 11/13/21   Suzzanne Cloud, NP      Allergies    Sulfa antibiotics, Sulfonamide derivatives, and Zestril [lisinopril]    Review of Systems   Review of Systems  Constitutional:  Negative for chills and fever.  Respiratory:  Negative for cough.   Cardiovascular:  Negative for chest pain.  Gastrointestinal:  Positive for abdominal pain. Negative for constipation, diarrhea, nausea and vomiting.  Genitourinary:  Positive for dysuria and vaginal pain. Negative for frequency, hematuria and urgency.  Musculoskeletal:  Positive for back pain. Negative for neck pain.  Neurological:  Negative for weakness and numbness.    Physical Exam Updated Vital Signs BP 137/74 (BP Location: Right Arm)   Pulse 79   Temp 97.9 F (36.6 C)   Resp 17   SpO2 94%  Physical Exam Vitals and nursing note reviewed.  Constitutional:      General: She is not in acute distress.    Appearance: Normal appearance. She is not ill-appearing or toxic-appearing.  HENT:     Head: Normocephalic and atraumatic.  Eyes:     General: No scleral icterus. Cardiovascular:     Rate and Rhythm: Normal rate and regular rhythm.  Pulmonary:     Effort: Pulmonary effort is normal. No respiratory distress.     Breath sounds: Normal breath sounds.  Abdominal:     General: Bowel sounds are normal.     Palpations: Abdomen is soft.     Tenderness: There is abdominal tenderness. There is no right CVA tenderness, left CVA tenderness, guarding or rebound.     Comments: Suprapubic tenderness on exam without guarding or rebound. Patient reports that the palpation in her suprapubic region gives her urinary urgency, and not much pain.  Normal active bowel sounds.  Musculoskeletal:        General: No deformity.     Cervical back: Normal range of motion.      Comments: No midline cervical, thoracic, or lumbar tenderness palpation.  No step-offs or deformities.  She has some lower paraspinal right lumbar tenderness.  No overlying skin changes.  No increased erythema or warmth to the area.  Atraumatic.  Strength is equal in her upper and lower bilateral extremities.  Compartments are soft.  Sensation intact.  Positive straight leg raise on the right.  Palpable pulses.  Skin:    General: Skin is warm and dry.  Neurological:     General: No focal deficit present.     Mental Status: She is alert. Mental status is at baseline.     Sensory: No sensory deficit.     Motor: No weakness.  ED Results / Procedures / Treatments   Labs (all labs ordered are listed, but only abnormal results are displayed) Labs Reviewed  URINALYSIS, ROUTINE W REFLEX MICROSCOPIC - Abnormal; Notable for the following components:      Result Value   Color, Urine ORANGE (*)    APPearance CLOUDY (*)    Glucose, UA 250 (*)    Bilirubin Urine MODERATE (*)    Protein, ur >300 (*)    Nitrite POSITIVE (*)    Leukocytes,Ua MODERATE (*)    All other components within normal limits  CBC WITH DIFFERENTIAL/PLATELET - Abnormal; Notable for the following components:   RBC 3.68 (*)    MCV 101.1 (*)    All other components within normal limits  COMPREHENSIVE METABOLIC PANEL - Abnormal; Notable for the following components:   Chloride 113 (*)    CO2 21 (*)    Glucose, Bld 107 (*)    BUN 27 (*)    Creatinine, Ser 1.83 (*)    GFR, Estimated 29 (*)    All other components within normal limits  URINE CULTURE  URINALYSIS, MICROSCOPIC (REFLEX)    EKG None  Radiology No results found.  Procedures Procedures   Medications Ordered in ED Medications  oxyCODONE (Oxy IR/ROXICODONE) immediate release tablet 5 mg (5 mg Oral Given 01/27/22 1419)    ED Course/ Medical Decision Making/ A&P                           Medical Decision Making Amount and/or Complexity of Data  Reviewed Labs: ordered.  Risk OTC drugs. Prescription drug management.    77 y/o F presents to the ED for evaluation of vaginal pain, dysuria and chronic back pain.  Differential diagnosis includes was not limited to UTI, pyelonephritis, interstitial cystitis, urethritis, chronic back pain, cauda equina, epidural abscess, MSK pain.  Vital signs are unremarkable.  Physical exam as listed above.  Patient request medication in the ER for her back pain.  Labs ordered.  I independently reviewed and interpreted the patient's labs.  CMP shows mildly increased chloride at 113.  Mild decrease in bicarb at 21.  Slightly elevated glucose at 107 although not fasting.  Creatinine at 1.83 which is around her baseline.  CBC shows no leukocytosis or anemia.  Urinalysis shows orange, cloudy urine with 250 glucose.  Moderate amount of bilirubin.  Greater than 300 protein with positive nitrites and moderate leukocytes.  Patient has been taking Azo which is likely contaminated with this.  We will still obtain urine culture.  Urinalysis shows greater than 50 white blood cells with no bacteria seen.  Questionable the patient has interstitial cystitis given the story she was telling me previously about her seeing the nephrologist.  Given her age as well as her symptoms, we will still place patient on antibiotic to cover for UTI.  I did place a referral for urologist given that she reports she has this feeling off and on for the past year.  I recommended that she follow-up with her primary care doctor for her chronic back pain.  Her back pain is paraspinal and muscular.  She does not have any midline tenderness.  No CVA tenderness bilaterally.  I do not think this back pain and her urinary symptoms are related.  Patient is requesting narcotic pain medication to go home for back pain.  I discussed with the patient that I do not feel this is safe given her ambulation as well  as risk for falls.  I discussed with her that  I am not allowed to give narcotic pain medication out of the ER for chronic pain.  And that she will need to follow-up with her back specialist/PCP to have that discussion with them.  I did recommend that she take Tylenol and apply lidocaine patches for this pain.  Wrote the patient for lidocaine patches for at home use.  We will also put her on cefpodoxime once daily given her creatinine clearance for the next 7 days.  We discussed strict return precautions and red flag symptoms.  Patient verbalized understanding and agrees with the plan.  Patient is stable being discharged home in good condition.  I discussed this case with my attending physician who cosigned this note including patient's presenting symptoms, physical exam, and planned diagnostics and interventions. Attending physician stated agreement with plan or made changes to plan which were implemented.   Final Clinical Impression(s) / ED Diagnoses Final diagnoses:  Chronic right-sided low back pain with right-sided sciatica  Acute cystitis without hematuria    Rx / DC Orders ED Discharge Orders          Ordered    lidocaine (HM LIDOCAINE PATCH) 4 %  Every 24 hours        01/27/22 1500    cefpodoxime (VANTIN) 100 MG tablet  Daily        01/27/22 1611              Sherrell Puller, PA-C 01/28/22 1702    Wynona Dove A, DO 01/29/22 0142

## 2022-01-27 NOTE — ED Triage Notes (Signed)
Pt here with L sided back pain and painful urination since  yesterday.

## 2022-01-28 LAB — URINE CULTURE: Culture: <10000 — AB

## 2022-02-18 ENCOUNTER — Other Ambulatory Visit: Payer: Self-pay | Admitting: Cardiovascular Disease

## 2022-03-19 ENCOUNTER — Ambulatory Visit (INDEPENDENT_AMBULATORY_CARE_PROVIDER_SITE_OTHER): Payer: Medicare Other | Admitting: Emergency Medicine

## 2022-03-19 ENCOUNTER — Encounter: Payer: Self-pay | Admitting: Emergency Medicine

## 2022-03-19 VITALS — BP 132/74 | HR 72 | Temp 98.3°F | Ht 62.0 in | Wt 169.0 lb

## 2022-03-19 DIAGNOSIS — H6690 Otitis media, unspecified, unspecified ear: Secondary | ICD-10-CM | POA: Insufficient documentation

## 2022-03-19 DIAGNOSIS — G8929 Other chronic pain: Secondary | ICD-10-CM | POA: Insufficient documentation

## 2022-03-19 DIAGNOSIS — G43009 Migraine without aura, not intractable, without status migrainosus: Secondary | ICD-10-CM

## 2022-03-19 DIAGNOSIS — H9202 Otalgia, left ear: Secondary | ICD-10-CM | POA: Diagnosis not present

## 2022-03-19 MED ORDER — TRAMADOL HCL 50 MG PO TABS: 50.0000 mg | ORAL_TABLET | Freq: Three times a day (TID) | ORAL | 0 refills | Status: AC | PRN

## 2022-03-19 MED ORDER — AMITRIPTYLINE HCL 50 MG PO TABS
50.0000 mg | ORAL_TABLET | Freq: Every day | ORAL | 1 refills | Status: DC
Start: 2022-03-19 — End: 2022-08-20

## 2022-03-19 MED ORDER — AZITHROMYCIN 250 MG PO TABS: ORAL_TABLET | ORAL | 0 refills | Status: DC

## 2022-03-19 NOTE — Patient Instructions (Signed)
Earache, Adult ?An earache, or ear pain, can be caused by many things, including: ?An infection. ?Ear wax buildup. ?Ear pressure. ?Something in the ear that should not be there (foreign body). ?A sore throat. ?Tooth problems. ?Jaw problems. ?Treatment of the earache will depend on the cause. If the cause is not clear or cannot be determined, you may need to watch your symptoms until your earache goes away or until a cause is found. ?Follow these instructions at home: ?Medicines ?Take or apply over-the-counter and prescription medicines only as told by your health care provider. ?If you were prescribed an antibiotic medicine, use it as told by your health care provider. Do not stop using the antibiotic even if you start to feel better. ?Do not put anything in your ear other than medicine that is prescribed by your health care provider. ?Managing pain ?If directed, apply heat to the affected area as often as told by your health care provider. Use the heat source that your health care provider recommends, such as a moist heat pack or a heating pad. ?Place a towel between your skin and the heat source. ?Leave the heat on for 20-30 minutes. ?Remove the heat if your skin turns bright red. This is especially important if you are unable to feel pain, heat, or cold. You may have a greater risk of getting burned. ?If directed, put ice on the affected area as often as told by your health care provider. To do this: ? ?  ? ?Put ice in a plastic bag. ?Place a towel between your skin and the bag. ?Leave the ice on for 20 minutes, 2-3 times a day. ?General instructions ?Pay attention to any changes in your symptoms. ?Try resting in an upright position instead of lying down. This may help to reduce pressure in your ear and relieve pain. ?Chew gum if it helps to relieve your ear pain. ?Treat any allergies as told by your health care provider. ?Drink enough fluid to keep your urine pale yellow. ?It is up to you to get the results of  any tests that were done. Ask your health care provider, or the department that is doing the tests, when your results will be ready. ?Keep all follow-up visits as told by your health care provider. This is important. ?Contact a health care provider if: ?Your pain does not improve within 2 days. ?Your earache gets worse. ?You have new symptoms. ?You have a fever. ?Get help right away if you: ?Have a severe headache. ?Have a stiff neck. ?Have trouble swallowing. ?Have redness or swelling behind your ear. ?Have fluid or blood coming from your ear. ?Have hearing loss. ?Feel dizzy. ?Summary ?An earache, or ear pain, can be caused by many things. ?Treatment of the earache will depend on the cause. Follow recommendations from your health care provider to treat your ear pain. ?If the cause is not clear or cannot be determined, you may need to watch your symptoms until your earache goes away or until a cause is found. ?Keep all follow-up visits as told by your health care provider. This is important. ?This information is not intended to replace advice given to you by your health care provider. Make sure you discuss any questions you have with your health care provider. ?Document Revised: 02/18/2019 Document Reviewed: 02/19/2019 ?Elsevier Patient Education ? 2023 [employer] ? ?

## 2022-03-19 NOTE — Assessment & Plan Note (Signed)
Take Tylenol for mild to moderate pain and tramadol for moderate to severe pain as needed. Advised to contact the office if no better or worse during the next several days.

## 2022-03-19 NOTE — Assessment & Plan Note (Signed)
Middle ear infection. May benefit from daily azithromycin for 5 days.

## 2022-03-19 NOTE — Progress Notes (Signed)
[name] 76 y.o.   Chief Complaint  Patient presents with   neck and back pain     Pain started Sunday, constant pain , patient states her left ear is " buzzing"     HISTORY OF PRESENT ILLNESS: Acute problem visit today. This is a 76 y.o. female complaining of constant sharp pain to left ear for the last 4 days along with "buzzing" of left ear.  No other associated symptoms. No other complaints or medical concerns today.  HPI   Prior to Admission medications   Medication Sig Start Date End Date Taking? Authorizing Provider  azithromycin (ZITHROMAX) 250 MG tablet Sig as indicated 03/19/22  Yes Einar Nolasco, Ines Bloomer, MD  traMADol (ULTRAM) 50 MG tablet Take 1 tablet (50 mg total) by mouth every 8 (eight) hours as needed for up to 5 days. 03/19/22 03/24/22 Yes Beyza Bellino, Ines Bloomer, MD  acetaminophen (TYLENOL) 500 MG tablet Take 1,000 mg by mouth every 6 (six) hours as needed (back pain.).    [provider]  amitriptyline (ELAVIL) 50 MG tablet Take 1 tablet (50 mg total) by mouth at bedtime. 03/19/22   Horald Pollen, MD  aspirin 81 MG EC tablet Take 81 mg by mouth daily in the afternoon.    [provider]  bisoprolol (ZEBETA) 5 MG tablet TAKE 1 TABLET BY MOUTH DAILY. 07/12/21   [name], MD  dexlansoprazole (DEXILANT) 60 MG capsule Take 1 capsule (60 mg total) by mouth daily. 02/19/21   Esterwood, Amy S, PA-C  dicyclomine (BENTYL) 10 MG capsule Take 1 capsule (10 mg total) by mouth every 6 (six) hours as needed for spasms. 11/21/21   Horald Pollen, MD  furosemide (LASIX) 40 MG tablet Take 1 tablet (40 mg total) by mouth daily. 12/09/21   Richardson Dopp T, PA-C  hydrALAZINE (APRESOLINE) 10 MG tablet Take 1 tablet (10 mg total) by mouth 3 (three) times daily. 08/13/21 08/13/22  Richardson Dopp T, PA-C  isosorbide mononitrate (IMDUR) 30 MG 24 hr tablet Take 0.5 tablets (15 mg total) by mouth daily. 08/13/21 08/13/22  Richardson Dopp T, PA-C  lidocaine (HM  LIDOCAINE PATCH) 4 % Place 1 patch onto the skin daily. 01/27/22   Sherrell Puller, PA-C  nitroGLYCERIN (NITROSTAT) 0.4 MG SL tablet Place 1 tablet (0.4 mg total) under the tongue every 5 (five) minutes as needed for chest pain. 03/29/19 12/25/21  Richardson Dopp T, PA-C  polyethylene glycol powder (GLYCOLAX/MIRALAX) powder Take 17 g by mouth daily as needed for mild constipation. 07/08/17   Daleen Squibb, MD  PROAIR HFA 108 [phone] Base) MCG/ACT inhaler 1 puff daily as needed. Patient not taking: Reported on 03/19/2022 03/27/21   Horald Pollen, MD  rosuvastatin (CRESTOR) 20 MG tablet TAKE 1 TABLET (20 MG TOTAL) BY MOUTH AT BEDTIME. PLEASE MAKE YEARLY APPT WITH DR. Burt Knack FOR OCTOBER 2022 FOR FUTURE REFILLS. 02/18/22   Richardson Dopp T, PA-C  Tiotropium Bromide-Olodaterol (STIOLTO RESPIMAT) 2.5-2.5 MCG/ACT AERS Inhale 2 puffs into the lungs daily. 01/20/22   Hunsucker, Bonna Gains, MD  Tiotropium Bromide-Olodaterol (STIOLTO RESPIMAT) 2.5-2.5 MCG/ACT AERS Inhale 2 puffs into the lungs daily. 01/20/22   Hunsucker, Bonna Gains, MD  topiramate (TOPAMAX) 50 MG tablet Take 1 tablet (50 mg total) by mouth daily. 11/13/21   Suzzanne Cloud, NP    Allergies  Allergen Reactions   Sulfa Antibiotics Other (See Comments)    Unknown, childhood allergy    Sulfonamide Derivatives Other (See Comments)  UNSURE   Zestril [Lisinopril] Cough    Patient Active Problem List   Diagnosis Date Noted   HFrEF (heart failure with reduced ejection fraction) ([place]) 12/02/2021   Stage 3b chronic kidney disease ([place]) 08/08/2021   Chronic insomnia 11/14/2019   Colon polyp 06/16/2018   Thoracic aortic aneurysm without rupture ([place]) 03/31/2018   Chronic renal insufficiency    AICD (automatic cardioverter/defibrillator) present    Ischemic cardiomyopathy    COPD (chronic obstructive pulmonary disease) ([place])    Hyperlipemia    Insomnia    Pre-diabetes 06/19/2017   Gastric polyp    Osteoporosis 05/11/2013   Hearing loss  04/29/2013   Mild cognitive impairment 04/29/2013   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   Implantable cardioverter-defibrillator (ICD) in situ 08/06/2011   DDD (degenerative disc disease), lumbar 05/16/2011   History of pulmonary embolus (PE) 01/02/2011   UNSPECIFIED ANEMIA 07/03/2010   Essential hypertension 09/05/2009   Shortness of breath 08/30/2009   DIVERTICULOSIS-COLON 08/14/2009   Generalized abdominal pain 08/14/2009   PERSONAL HX COLONIC POLYPS 04/24/2009   TOBACCO ABUSE 04/04/2009   Coronary artery disease involving native coronary artery of native heart with angina pectoris ([place]) 02/25/2009   GERD 10/19/2007   IBS 10/19/2007   COLONIC POLYPS, ADENOMATOUS 05/12/2007    Past Medical History:  Diagnosis Date   Abdominal pain, left lower quadrant 08/14/2009   Qualifier: Diagnosis of  By: Laney Potash, Pam     Abnormal CT scan, stomach    Abnormal LFTs 06/19/2017   AICD (automatic cardioverter/defibrillator) present    Dr.Allred follows   Aortic arch pseudoaneurysm ([place])    a. followed by Dr. Prescott Gum.   Arthritis    Asthma    Benign neoplasm of colon    CAD (coronary artery disease) 02/2009   a. anterior STEMI rx with BMS to prox LAD in 02/2009. b. ISR s/p PTCA 06/2009. c. ISR s/p thrombectomy & PTCA 03/2010 due to late stent thrombosis. // Myoview 04/2019: EF 28, ant, ant-sept, inf-sept scar, no ischemia, high risk (stable>>cont med Rx)    Cardiomyopathy, ischemic 06/12/2011   Chronic renal insufficiency    stage 3   Chronic systolic CHF (congestive heart failure) ([place])    a. s/p ST. Jude ICD 10-30-2011.   Chronic systolic heart failure ([place]) 08/05/2011   Colon polyp    Complication of anesthesia    COPD (chronic obstructive pulmonary disease) ([place])    CORONARY ARTERY DISEASE 04/24/2009   Qualifier: Diagnosis of  By: Shane Crutch, Amy S    DDD (degenerative disc disease), lumbar 05/16/2011   Depression    "since my son died in Nov 30, 2022" 10-29-2017)   Diverticulosis     DIVERTICULOSIS-COLON 08/14/2009   Qualifier: Diagnosis of  By: Trellis Paganini PA-c, Amy S    DYSPHAGIA UNSPECIFIED 04/24/2009   Qualifier: Diagnosis of  By: Laney Potash, Pam     Dyspnea    "when I lay down at night"    Essential hypertension 09/05/2009   Qualifier: Diagnosis of  By: Harvest Dark CMA, Jennifer     Gastric polyp    GERD 10/19/2007   Qualifier: Diagnosis of  By: Rennie Plowman RN, Blanca Friend: Diagnosis of  By: Laney Potash, Pam     GERD (gastroesophageal reflux disease)    GI bleed    a. h/o GIB on DAPT, now on ASA only.   Headache 01/03/2013   Hearing loss    left ear   History of pulmonary embolus (PE) 01/02/2011   Hyperlipemia  Hyperlipidemia, unspecified 04/24/2009   Qualifier: Diagnosis of  By: Trellis Paganini PA-c, Amy S    Hypertension    ICD-St.Jude 08/06/2011   S/p St. Jude ICD placement 08/05/11    Insomnia    Irritable bowel syndrome    Ischemic cardiomyopathy    EF 10-15%   Memory loss    Migraine headache without aura    Myocardial infarct ([place]) 2011 x 2   Dr. 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[place]) 05/05/2012   Pulmonary embolism ([place]) 2011   Stroke [facility])    'When I was young." no residual   Thoracic aortic aneurysm ([place]) 03/31/2018   TOBACCO ABUSE 04/04/2009   Qualifier: Diagnosis of  By: Arvid Right  Qualifier: Diagnosis of  By: [name], MD, Jaquelyn Bitter    Transfusion history    ?'12 or '13   UNSPECIFIED ANEMIA 07/03/2010   Qualifier: Diagnosis of  By: Shelda Pal     Unspecified mastoiditis     Past Surgical History:  Procedure Laterality Date   ANGIOPLASTY  07/02/09, 04/01/10   BILATERAL SALPINGOOPHORECTOMY     CAD( bare metal stent)  02/2009   x 1   CAROTID-SUBCLAVIAN BYPASS  GRAFT Left 03/31/2018   Procedure: LEFT SUBCLAVIAN ARTERY BYPASS GRAFT;  Surgeon: Serafina Mitchell, MD;  Location: [facility] OR;  Service: Vascular;  Laterality: Left;   CERVICAL SPINE SURGERY  08/08   COLONOSCOPY WITH PROPOFOL N/A 04/29/2016   Procedure: COLONOSCOPY WITH PROPOFOL;  Surgeon: [name], MD;  Location: [facility];  Service: Gastroenterology;  Laterality: N/A;   ESOPHAGOGASTRODUODENOSCOPY N/A 12/09/2016   Procedure: ESOPHAGOGASTRODUODENOSCOPY (EGD);  Surgeon: [name], MD;  Location: [facility];  Service: Gastroenterology;  Laterality: N/A;   ESOPHAGOGASTRODUODENOSCOPY (EGD) WITH PROPOFOL N/A 04/29/2016   Procedure: ESOPHAGOGASTRODUODENOSCOPY (EGD) WITH PROPOFOL;  Surgeon: [name], MD;  Location: [facility];  Service: Gastroenterology;  Laterality: N/A;   EUS N/A 05/15/2016   Procedure: UPPER ENDOSCOPIC ULTRASOUND (EUS) RADIAL;  Surgeon: Milus Banister, MD;  Location: [facility];  Service: Endoscopy;  Laterality: N/A;   IMPLANTABLE CARDIOVERTER DEFIBRILLATOR IMPLANT N/A 08/05/2011   Primary prevention SJM ICD implanted,  Analyze ST study patient   INNER EAR SURGERY     left x 17   LUMBAR DISC SURGERY  02/2008   fusion   NASAL SEPTUM SURGERY     RIGHT HEART CATH N/A 12/11/2021   Procedure: RIGHT HEART CATH;  Surgeon: [name], MD;  Location: [facility];  Service: Cardiovascular;  Laterality: N/A;   THORACIC AORTIC ENDOVASCULAR STENT GRAFT N/A 03/31/2018   Procedure: THORACIC AORTIC ENDOVASCULAR STENT GRAFT;  Surgeon: Serafina Mitchell, MD;  Location: [facility];  Service: Vascular;  Laterality: N/A;   TOTAL ABDOMINAL HYSTERECTOMY     complete    Social History   Socioeconomic History   Marital status: Divorced    Spouse name: Not on file   Number of children: 2   Years of education: 11   Highest education level: Not on file  Occupational History   Occupation: Unemployed    Employer: UNEMPLOYED    Employer: DISABLED   Tobacco Use   Smoking status: Former    Packs/day: 0.13  Years: 30.00    Total pack years: 3.90    Types: Cigarettes   Smokeless tobacco: Never  Vaping Use   Vaping Use: Never used  Substance and Sexual Activity   Alcohol use: No    Alcohol/week: 0.0 standard drinks of alcohol   Drug use: No   Sexual activity: Not on file  Other Topics Concern   Not on file  Social History Narrative   Pt lives in [place] alone.  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   Patient has 11 th grade education.Right handed.   Caffeine- one cup daily         Social Determinants of Health   Financial Resource Strain: Low Risk  (12/13/2021)   Overall Financial Resource Strain (CARDIA)    Difficulty of Paying Living Expenses: Not hard at all  Food Insecurity: No Food Insecurity (12/13/2021)   Hunger Vital Sign    Worried About Running Out of Food in the Last Year: Never true    Ran Out of Food in the Last Year: Never true  Transportation Needs: No Transportation Needs (12/13/2021)   PRAPARE - Hydrologist (Medical): No    Lack of Transportation (Non-Medical): No  Physical Activity: Inactive (12/13/2021)   Exercise Vital Sign    Days of Exercise per Week: 0 days    Minutes of Exercise per Session: 0 min  Stress: No Stress Concern Present (12/13/2021)   [place]    Feeling of Stress : Not at all  Social Connections: Moderately Isolated (12/13/2021)   Social Connection and Isolation Panel [NHANES]    Frequency of Communication with Friends and Family: Three times a week    Frequency of Social Gatherings with Friends and Family: Three times a week    Attends Religious Services: 1 to 4 times per year    Active Member of Clubs or Organizations: No    Attends Archivist Meetings: Never    Marital Status: Divorced  Human resources officer Violence: Not At Risk (12/13/2021)   Humiliation, Afraid,  Rape, and Kick questionnaire    Fear of Current or Ex-Partner: No    Emotionally Abused: No    Physically Abused: No    Sexually Abused: No    Family History  Problem Relation Age of Onset   Colon cancer Mother    Other Brother        tumor in his brain   Bladder Cancer Brother    Throat cancer Brother    Cancer Son        kidney, spread to his liver   Breast cancer Cousin      Review of Systems  Constitutional: Negative.  Negative for chills and fever.  HENT:  Positive for ear pain. Negative for congestion and sore throat.   Respiratory:  Negative for cough and shortness of breath.   Cardiovascular:  Negative for chest pain and palpitations.  Gastrointestinal:  Negative for abdominal pain, nausea and vomiting.  Genitourinary: Negative.   Musculoskeletal:  Positive for neck pain.  Skin: Negative.  Negative for rash.  Neurological:  Negative for dizziness and headaches.  All other systems reviewed and are negative. Today's Vitals   03/19/22 1317  BP: 132/74  Pulse: 72  Temp: 98.3 F (36.8 C)  TempSrc: Oral  SpO2: 95%  Weight: 169 lb (76.7 kg)  Height: '5\' 2"'$  (1.575 m)   Body mass index is 30.91 kg/m.   Physical Exam  Vitals reviewed.  Constitutional:      Appearance: Normal appearance.  HENT:     Head: Normocephalic.     Right Ear: Tympanic membrane, ear canal and external ear normal.     Left Ear: Ear canal and external ear normal.     Ears:     Comments: Dull and slightly hyperemic left tympanic membrane    Mouth/Throat:     Mouth: Mucous membranes are moist.     Pharynx: Oropharynx is clear.  Eyes:     Extraocular Movements: Extraocular movements intact.     Pupils: Pupils are equal, round, and reactive to light.  Cardiovascular:     Rate and Rhythm: Normal rate and regular rhythm.     Pulses: Normal pulses.     Heart sounds: Normal heart sounds.  Pulmonary:     Effort: Pulmonary effort is normal.     Breath sounds: Normal breath sounds.   Musculoskeletal:     Cervical back: No tenderness.  Lymphadenopathy:     Cervical: No cervical adenopathy.  Skin:    General: Skin is warm and dry.  Neurological:     General: No focal deficit present.     Mental Status: She is alert and oriented to person, place, and time.  Psychiatric:        Mood and Affect: Mood normal.        Behavior: Behavior normal.    ASSESSMENT & PLAN: A total of 33 minutes was spent with the patient and counseling/coordination of care regarding preparing for this visit, review of most recent office visit notes, review of multiple chronic medical problems under management, review of all medications, diagnosis of ear infection and treatment with antibiotics, pain management, prognosis, documentation, need for follow-up.  Problem List Items Addressed This Visit       Nervous and Auditory   Acute otalgia, left - Primary    Take Tylenol for mild to moderate pain and tramadol for moderate to severe pain as needed. Advised to contact the office if no better or worse during the next several days.      Relevant Medications   traMADol (ULTRAM) 50 MG tablet   Ear infection    Middle ear infection. May benefit from daily azithromycin for 5 days.      Relevant Medications   azithromycin (ZITHROMAX) 250 MG tablet   Other Visit Diagnoses     Migraine without aura and without status migrainosus, not intractable       Relevant Medications   amitriptyline (ELAVIL) 50 MG tablet   traMADol (ULTRAM) 50 MG tablet        Patient Instructions  Earache, Adult An earache, or ear pain, can be caused by many things, including: An infection. Ear wax buildup. Ear pressure. Something in the ear that should not be there (foreign body). A sore throat. Tooth problems. Jaw problems. Treatment of the earache will depend on the cause. If the cause is not clear or cannot be determined, you may need to watch your symptoms until your earache goes away or until a cause  is found. Follow these instructions at home: Medicines Take or apply over-the-counter and prescription medicines only as told by your health care provider. If you were prescribed an antibiotic medicine, use it as told by your health care provider. Do not stop using the antibiotic even if you start to feel better. Do not put anything in your ear other than medicine that is prescribed by your health care provider. Managing pain  If directed, apply heat to the affected area as often as told by your health care provider. Use the heat source that your health care provider recommends, such as a moist heat pack or a heating pad. Place a towel between your skin and the heat source. Leave the heat on for 20-30 minutes. Remove the heat if your skin turns bright red. This is especially important if you are unable to feel pain, heat, or cold. You may have a greater risk of getting burned. If directed, put ice on the affected area as often as told by your health care provider. To do this:     Put ice in a plastic bag. Place a towel between your skin and the bag. Leave the ice on for 20 minutes, 2-3 times a day. General instructions Pay attention to any changes in your symptoms. Try resting in an upright position instead of lying down. This may help to reduce pressure in your ear and relieve pain. Chew gum if it helps to relieve your ear pain. Treat any allergies as told by your health care provider. Drink enough fluid to keep your urine pale yellow. It is up to you to get the results of any tests that were done. Ask your health care provider, or the department that is doing the tests, when your results will be ready. Keep all follow-up visits as told by your health care provider. This is important. Contact a health care provider if: Your pain does not improve within 2 days. Your earache gets worse. You have new symptoms. You have a fever. Get help right away if you: Have a severe headache. Have a  stiff neck. Have trouble swallowing. Have redness or swelling behind your ear. Have fluid or blood coming from your ear. Have hearing loss. Feel dizzy. Summary An earache, or ear pain, can be caused by many things. Treatment of the earache will depend on the cause. Follow recommendations from your health care provider to treat your ear pain. If the cause is not clear or cannot be determined, you may need to watch your symptoms until your earache goes away or until a cause is found. Keep all follow-up visits as told by your health care provider. This is important. This information is not intended to replace advice given to you by your health care provider. Make sure you discuss any questions you have with your health care provider. Document Revised: 02/18/2019 Document Reviewed: 02/19/2019 Elsevier Patient Education  [place], MD [facility]

## 2022-03-20 ENCOUNTER — Other Ambulatory Visit: Payer: Self-pay | Admitting: Physician Assistant

## 2022-03-20 DIAGNOSIS — K219 Gastro-esophageal reflux disease without esophagitis: Secondary | ICD-10-CM

## 2022-03-24 ENCOUNTER — Ambulatory Visit: Payer: Medicaid Other | Admitting: Emergency Medicine

## 2022-04-03 ENCOUNTER — Ambulatory Visit (INDEPENDENT_AMBULATORY_CARE_PROVIDER_SITE_OTHER): Payer: Medicare Other

## 2022-04-03 DIAGNOSIS — I255 Ischemic cardiomyopathy: Secondary | ICD-10-CM | POA: Diagnosis not present

## 2022-04-03 LAB — CUP PACEART REMOTE DEVICE CHECK
Battery Remaining Longevity: 9 mo
Battery Remaining Percentage: 7 %
Battery Voltage: 2.65 V
Brady Statistic RV Percent Paced: 1 %
Date Time Interrogation Session: 20230907030357
HighPow Impedance: 77 Ohm
HighPow Impedance: 77 Ohm
Implantable Lead Implant Date: 20130108
Implantable Lead Location: 753860
Implantable Pulse Generator Implant Date: 20130108
Lead Channel Impedance Value: 490 Ohm
Lead Channel Pacing Threshold Amplitude: 0.75 V
Lead Channel Pacing Threshold Pulse Width: 0.5 ms
Lead Channel Sensing Intrinsic Amplitude: 12 mV
Lead Channel Setting Pacing Amplitude: 2.5 V
Lead Channel Setting Pacing Pulse Width: 0.5 ms
Lead Channel Setting Sensing Sensitivity: 0.5 mV
Pulse Gen Serial Number: 1016523

## 2022-04-19 NOTE — Progress Notes (Signed)
Remote ICD transmission.   

## 2022-04-23 ENCOUNTER — Encounter: Payer: Self-pay | Admitting: Pulmonary Disease

## 2022-04-23 ENCOUNTER — Ambulatory Visit (INDEPENDENT_AMBULATORY_CARE_PROVIDER_SITE_OTHER): Payer: Medicare Other | Admitting: Pulmonary Disease

## 2022-04-23 VITALS — BP 124/70 | HR 86 | Ht 62.0 in | Wt 172.0 lb

## 2022-04-23 DIAGNOSIS — R0602 Shortness of breath: Secondary | ICD-10-CM

## 2022-04-23 NOTE — Patient Instructions (Signed)
Nice to see you again  I am glad the Stiolto has helped.  Continue to use this 2 puffs once a day every day.  You can use the other inhaler, albuterol 2 puffs as needed for shortness of breath or wheeze.  Return to clinic in 3 months or sooner if needed with Dr. Silas Flood

## 2022-04-23 NOTE — Progress Notes (Signed)
$'@Patient'b$  ID: [name], female    DOB: 05/04/1946, 76 y.o.   MRN: [id number]  Chief Complaint  Patient presents with   Follow-up    3 mo f/u for SOB.     Referring provider: Horald Pollen, *  HPI:   76 y.o. woman whom we are seeing for evaluation of dyspnea on exertion.  Most recent PCP note reviewed.    Patient returns for routine follow-up.  At last visit she was prescribed Stiolto for dyspnea on exertion.  This is based on PFTs in 2016 that revealed air trapping.  PFTs reviewed once again today and interpreted as below.  She thinks Stiolto is helped.  She reports good adherence using it every day although different times in the morning.  Based on her schedule.  Gives her more energy, more breath.  Has had very few episodes of significant dyspnea on exertion.  She is not using albuterol at all.  Clarified the use of albuterol if she needs it.  HPI at initial visit: Patient reports dyspnea for the last several months.  When further questioned after seeing the pulmonary doctor in 2011 she states she is prior internal breath for much longer than that.  Likely for many years.  Worse on inclines or stairs.  When carrying groceries or other objects.  Pushing vacuum etc.  Does okay on flat surfaces but can get short of breath.  When rest everything is better.  No time of day when things are better or worse.  No seasonal environmental factors to identify to make things better or worse.  No position that makes things better or worse.  Uses albuterol as needed with mild improvement in symptoms.  Reviewed his recent chest imaging chest x-ray 10/2021 that reveals clear lungs bilaterally.  Most recent TTE 04/2021 with reduced EF 28%, grade 1 diastolic dysfunction, normal RV function.  Reviewed right heart cath 11/2021 demonstrates mean PA pressure of 19, wedge of 10 with cardiac output of 4.6.  PMH: CAD, reduced EF 25%, hyperlipidemia, Surgical history: Cervical spine surgery, lumbar  surgery, nasal septum surgery, thoracic aortic aneurysm repair Family history: Mother with colon cancer, son with kidney cancer Social history: Former smoker, remote history, Therapist, music, lives in [place] / Pulmonary Flowsheets:   ACT:      No data to display          MMRC:     No data to display          Epworth:      No data to display          Tests:   FENO:  No results found for: "NITRICOXIDE"  PFT:    Latest Ref Rng & Units 03/16/2015    1:35 PM  PFT Results  FVC-Pre L 2.15   FVC-Predicted Pre % 72   FVC-Post L 2.34   FVC-Predicted Post % 78   Pre FEV1/FVC % % 77   Post FEV1/FCV % % 79   FEV1-Pre L 1.65   FEV1-Predicted Pre % 73   FEV1-Post L 1.85   DLCO uncorrected ml/min/mmHg 14.06   DLCO UNC% % 61   DLVA Predicted % 76   TLC L 4.73   TLC % Predicted % 96   RV % Predicted % 134   Personally reviewed interpreted as spirometry suggestive of air trapping versus moderate restriction.  TLC within the limits.  Air trapping confirmed on lung volumes.  DLCO moderately reduced.  WALK:  No data to display          Imaging: Personally reviewed CUP PACEART REMOTE DEVICE CHECK  Result Date: 04/03/2022 Scheduled remote reviewed. Normal device function.  The device estimates 8.9 months until ERI Next remote 91 days. Kathy Breach, RN, CCDS, [employer]   Lab Results: Personally reviewed CBC    Component Value Date/Time   WBC 7.3 01/27/2022 1249   RBC 3.68 (L) 01/27/2022 1249   HGB 12.1 01/27/2022 1249   HGB 11.2 12/06/2021 0852   HCT 37.2 01/27/2022 1249   HCT 33.4 (L) 12/06/2021 0852   PLT 212 01/27/2022 1249   PLT 268 12/06/2021 0852   MCV 101.1 (H) 01/27/2022 1249   MCV 98 (H) 12/06/2021 0852   MCH 32.9 01/27/2022 1249   MCHC 32.5 01/27/2022 1249   RDW 14.8 01/27/2022 1249   RDW 14.2 12/06/2021 0852   LYMPHSABS 2.8 01/27/2022 1249   LYMPHSABS 3.8 (H) 06/08/2017 1230   MONOABS 0.5 01/27/2022 1249    EOSABS 0.2 01/27/2022 1249   EOSABS 0.1 06/08/2017 1230   BASOSABS 0.0 01/27/2022 1249   BASOSABS 0.0 06/08/2017 1230    BMET    Component Value Date/Time   NA 141 01/27/2022 1249   NA 141 01/09/2022 0927   K 3.9 01/27/2022 1249   CL 113 (H) 01/27/2022 1249   CO2 21 (L) 01/27/2022 1249   GLUCOSE 107 (H) 01/27/2022 1249   BUN 27 (H) 01/27/2022 1249   BUN 29 (H) 01/09/2022 0927   CREATININE 1.83 (H) 01/27/2022 1249   CREATININE 1.56 (H) 01/15/2016 1345   CALCIUM 9.0 01/27/2022 1249   GFRNONAA 29 (L) 01/27/2022 1249   GFRAA 44 (L) 03/12/2020 1442    BNP    Component Value Date/Time   BNP 188.2 (H) 05/22/2021 1352   BNP 105.4 (H) 01/15/2016 1345    ProBNP    Component Value Date/Time   PROBNP 945 (H) 08/09/2021 1028   PROBNP 191.0 (H) 05/16/2011 1137    Specialty Problems       Pulmonary Problems   Shortness of breath    Centricity Description: SHORTNESS OF BREATH Qualifier: Diagnosis of  By: Karlene Einstein   Centricity Description: DYSPNEA Qualifier: Diagnosis of  By: Chase Caller MD, Murali        COPD (chronic obstructive pulmonary disease) ([place])    Allergies  Allergen Reactions   Sulfa Antibiotics Other (See Comments)    Unknown, childhood allergy    Sulfonamide Derivatives Other (See Comments)    UNSURE   Zestril [Lisinopril] Cough    Immunization History  Administered Date(s) Administered   Fluad Quad(high Dose 65+) 04/03/2016, 04/21/2017, 04/06/2020, 03/27/2021   Influenza Nasal 04/27/2017   Influenza Split 08/06/2011   Influenza Whole 04/27/2009   Influenza, High Dose Seasonal PF 04/29/2013, 06/09/2014, 05/12/2015, 04/03/2016, 04/21/2017, 04/17/2018, 04/25/2018, 04/18/2019   Influenza-Unspecified 04/15/2012   Moderna Sars-Covid-2 Vaccination 09/09/2019   Pneumococcal Conjugate-13 06/14/2014, 06/16/2018   Pneumococcal Polysaccharide-23 04/27/2009, 08/06/2011, 04/15/2012, 04/03/2016   Zoster, Live 05/03/2013    Past Medical History:   Diagnosis Date   Abdominal pain, left lower quadrant 08/14/2009   Qualifier: Diagnosis of  By: Laney Potash, Pam     Abnormal CT scan, stomach    Abnormal LFTs 06/19/2017   AICD (automatic cardioverter/defibrillator) present    Dr.Allred follows   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 followed by Dr. Prescott Gum.   Arthritis    Asthma    Benign neoplasm of colon  CAD (coronary artery disease) 02/2009   a. anterior STEMI rx with BMS to prox LAD in 02/2009. b. ISR s/p PTCA 06/2009. c. ISR s/p thrombectomy & PTCA 03/2010 due to late stent thrombosis. // Myoview 04/2019: EF 28, ant, ant-sept, inf-sept scar, no ischemia, high risk (stable>>cont med Rx)    Cardiomyopathy, ischemic 06/12/2011   Chronic renal insufficiency    stage 3   Chronic systolic CHF (congestive heart failure) ([place])    a. s/p ST. Jude ICD 10/16/11.   Chronic systolic heart failure ([place]) 08/05/2011   Colon polyp    Complication of anesthesia    COPD (chronic obstructive pulmonary disease) ([place])    CORONARY ARTERY DISEASE 04/24/2009   Qualifier: Diagnosis of  By: Shane Crutch, Amy S    DDD (degenerative disc disease), lumbar 05/16/2011   Depression    "since my son died in November 16, 2022" October 15, 2017)   Diverticulosis    DIVERTICULOSIS-COLON 08/14/2009   Qualifier: Diagnosis of  By: Trellis Paganini PA-c, Amy S    DYSPHAGIA UNSPECIFIED 04/24/2009   Qualifier: Diagnosis of  By: Laney Potash, Pam     Dyspnea    "when I lay down at night"    Essential hypertension 09/05/2009   Qualifier: Diagnosis of  By: Harvest Dark CMA, Jennifer     Gastric polyp    GERD 10/19/2007   Qualifier: Diagnosis of  By: Rennie Plowman RN, Blanca Friend: Diagnosis of  By: Laney Potash, Pam     GERD (gastroesophageal reflux disease)    GI bleed    a. h/o GIB on DAPT, now on ASA only.   Headache 01/03/2013   Hearing loss    left ear   History of pulmonary embolus (PE) 01/02/2011   Hyperlipemia    Hyperlipidemia, unspecified 04/24/2009   Qualifier: Diagnosis of  By:  Trellis Paganini PA-c, Amy S    Hypertension    ICD-St.Jude 08/06/2011   S/p St. Jude ICD placement 08/05/11    Insomnia    Irritable bowel syndrome    Ischemic cardiomyopathy    EF 10-15%   Memory loss    Migraine headache without aura    Myocardial infarct ([place]) 2009-10-15 x 2   Dr. 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[facility]) 05/05/2012   Pulmonary embolism ([place]) 2009-10-15   Stroke [facility])    'When I was young." no residual   Thoracic aortic aneurysm ([place]) 03/31/2018   TOBACCO ABUSE 04/04/2009   Qualifier: Diagnosis of  By: Arvid Right  Qualifier: Diagnosis of  By: [name], MD, Jaquelyn Bitter    Transfusion history    ?'12 or '13   UNSPECIFIED ANEMIA 07/03/2010   Qualifier: Diagnosis of  By: Shelda Pal     Unspecified mastoiditis     Tobacco History: Social History   Tobacco Use  Smoking Status Former   Packs/day: 0.13   Years: 30.00   Total pack years: 3.90   Types: Cigarettes  Smokeless Tobacco Never   Counseling given: Not Answered   Continue to not smoke  Outpatient Encounter Medications as of 04/23/2022  Medication Sig   acetaminophen (TYLENOL) 500 MG tablet Take 1,000 mg by mouth every 6 (six) hours as needed (back pain.).   amitriptyline (ELAVIL) 50  MG tablet Take 1 tablet (50 mg total) by mouth at bedtime.   aspirin 81 MG EC tablet Take 81 mg by mouth daily in the afternoon.   bisoprolol (ZEBETA) 5 MG tablet TAKE 1 TABLET BY MOUTH DAILY.   dexlansoprazole (DEXILANT) 60 MG capsule Take 1 capsule (60 mg total) by mouth daily. **PLEASE CALL OFFICE TO SCHEDULE FOLLOW UP   dicyclomine (BENTYL) 10 MG capsule Take 1 capsule (10 mg total) by mouth every 6 (six) hours as needed for spasms.   furosemide (LASIX) 40  MG tablet Take 1 tablet (40 mg total) by mouth daily.   hydrALAZINE (APRESOLINE) 10 MG tablet Take 1 tablet (10 mg total) by mouth 3 (three) times daily.   isosorbide mononitrate (IMDUR) 30 MG 24 hr tablet Take 0.5 tablets (15 mg total) by mouth daily.   lidocaine (HM LIDOCAINE PATCH) 4 % Place 1 patch onto the skin daily.   polyethylene glycol powder (GLYCOLAX/MIRALAX) powder Take 17 g by mouth daily as needed for mild constipation.   PROAIR HFA 108 (90 Base) MCG/ACT inhaler 1 puff daily as needed.   rosuvastatin (CRESTOR) 20 MG tablet TAKE 1 TABLET (20 MG TOTAL) BY MOUTH AT BEDTIME. PLEASE MAKE YEARLY APPT WITH DR. Burt Knack FOR OCTOBER 2022 FOR FUTURE REFILLS.   Tiotropium Bromide-Olodaterol (STIOLTO RESPIMAT) 2.5-2.5 MCG/ACT AERS Inhale 2 puffs into the lungs daily.   topiramate (TOPAMAX) 50 MG tablet Take 1 tablet (50 mg total) by mouth daily.   nitroGLYCERIN (NITROSTAT) 0.4 MG SL tablet Place 1 tablet (0.4 mg total) under the tongue every 5 (five) minutes as needed for chest pain.   [DISCONTINUED] azithromycin (ZITHROMAX) 250 MG tablet Sig as indicated   [DISCONTINUED] Tiotropium Bromide-Olodaterol (STIOLTO RESPIMAT) 2.5-2.5 MCG/ACT AERS Inhale 2 puffs into the lungs daily.   No facility-administered encounter medications on file as of 04/23/2022.     Review of Systems  Review of Systems  N/a Physical Exam  BP 124/70   Pulse 86   Ht '5\' 2"'$  (1.575 m)   Wt 172 lb (78 kg)   SpO2 96% Comment: on RA  BMI 31.46 kg/m   Wt Readings from Last 5 Encounters:  04/23/22 172 lb (78 kg)  03/19/22 169 lb (76.7 kg)  01/20/22 174 lb 9.6 oz (79.2 kg)  12/25/21 172 lb 12.8 oz (78.4 kg)  12/11/21 174 lb (78.9 kg)    BMI Readings from Last 5 Encounters:  04/23/22 31.46 kg/m  03/19/22 30.91 kg/m  01/20/22 31.93 kg/m  12/25/21 31.61 kg/m  12/11/21 31.83 kg/m     Physical Exam  General: Sitting in chair, no acute distress Eyes: EOMI, no icterus Neck: Supple, no JVP Pulmonary: Clear,  normal work of breathing Cardiovascular: Regular rate and rhythm, warm Abdomen: Nondistended, bowel sounds present MSK: No synovitis, no joint effusion Neuro: Normal gait, no weakness Psych: Normal mood, full affect  Assessment & Plan:   Dyspnea on exertion: Suspect multifactorial, largely related to deconditioning given lack of activity over time.  High suspicion for cardiac contributor given her reduced EF, chronotropic/inotropic insufficiency.  PFTs in the past with air trapping, moderately reduced DLCO.  Symptoms improved with Stiolto.  To continue this.  Continue albuterol as needed.  Likely to benefit from graduated exercise program in the future.   Return in about 3 months (around 07/23/2022).   Lanier Clam, MD 04/23/2022

## 2022-04-30 ENCOUNTER — Encounter (HOSPITAL_COMMUNITY): Payer: Self-pay | Admitting: Emergency Medicine

## 2022-04-30 ENCOUNTER — Emergency Department (HOSPITAL_COMMUNITY)
Admission: EM | Admit: 2022-04-30 | Discharge: 2022-05-01 | Disposition: A | Discharge status: Home / Self Care | Payer: Medicare Other | Attending: Emergency Medicine | Admitting: Emergency Medicine

## 2022-04-30 DIAGNOSIS — Z7951 Long term (current) use of inhaled steroids: Secondary | ICD-10-CM | POA: Insufficient documentation

## 2022-04-30 DIAGNOSIS — R1030 Lower abdominal pain, unspecified: Secondary | ICD-10-CM | POA: Diagnosis present

## 2022-04-30 DIAGNOSIS — J45909 Unspecified asthma, uncomplicated: Secondary | ICD-10-CM | POA: Diagnosis not present

## 2022-04-30 DIAGNOSIS — I5022 Chronic systolic (congestive) heart failure: Secondary | ICD-10-CM | POA: Insufficient documentation

## 2022-04-30 DIAGNOSIS — J449 Chronic obstructive pulmonary disease, unspecified: Secondary | ICD-10-CM | POA: Insufficient documentation

## 2022-04-30 DIAGNOSIS — Z7982 Long term (current) use of aspirin: Secondary | ICD-10-CM | POA: Diagnosis not present

## 2022-04-30 DIAGNOSIS — N3000 Acute cystitis without hematuria: Secondary | ICD-10-CM | POA: Diagnosis not present

## 2022-04-30 DIAGNOSIS — I110 Hypertensive heart disease with heart failure: Secondary | ICD-10-CM | POA: Insufficient documentation

## 2022-04-30 DIAGNOSIS — I2510 Atherosclerotic heart disease of native coronary artery without angina pectoris: Secondary | ICD-10-CM | POA: Insufficient documentation

## 2022-04-30 DIAGNOSIS — Z79899 Other long term (current) drug therapy: Secondary | ICD-10-CM | POA: Insufficient documentation

## 2022-04-30 LAB — COMPREHENSIVE METABOLIC PANEL
ALT: 12 U/L (ref 0–44)
AST: 16 U/L (ref 15–41)
Albumin: 4 g/dL (ref 3.5–5.0)
Alkaline Phosphatase: 83 U/L (ref 38–126)
Anion gap: 7 (ref 5–15)
BUN: 24 mg/dL — ABNORMAL HIGH (ref 8–23)
CO2: 20 mmol/L — ABNORMAL LOW (ref 22–32)
Calcium: 9.3 mg/dL (ref 8.9–10.3)
Chloride: 115 mmol/L — ABNORMAL HIGH (ref 98–111)
Creatinine, Ser: 1.85 mg/dL — ABNORMAL HIGH (ref 0.44–1.00)
GFR, Estimated: 28 mL/min — ABNORMAL LOW (ref >60)
Glucose, Bld: 108 mg/dL — ABNORMAL HIGH (ref 70–99)
Potassium: 4.5 mmol/L (ref 3.5–5.1)
Sodium: 142 mmol/L (ref 135–145)
Total Bilirubin: 0.3 mg/dL (ref 0.3–1.2)
Total Protein: 6.9 g/dL (ref 6.5–8.1)

## 2022-04-30 LAB — CBC WITH DIFFERENTIAL/PLATELET
Abs Immature Granulocytes: 0.02 10*3/uL (ref 0.00–0.07)
Basophils Absolute: 0 10*3/uL (ref 0.0–0.1)
Basophils Relative: 0 %
Eosinophils Absolute: 0.2 10*3/uL (ref 0.0–0.5)
Eosinophils Relative: 2 %
HCT: 37.4 % (ref 36.0–46.0)
Hemoglobin: 12.1 g/dL (ref 12.0–15.0)
Immature Granulocytes: 0 %
Lymphocytes Relative: 41 %
Lymphs Abs: 3.1 10*3/uL (ref 0.7–4.0)
MCH: 32.9 pg (ref 26.0–34.0)
MCHC: 32.4 g/dL (ref 30.0–36.0)
MCV: 101.6 fL — ABNORMAL HIGH (ref 80.0–100.0)
Monocytes Absolute: 0.4 10*3/uL (ref 0.1–1.0)
Monocytes Relative: 5 %
Neutro Abs: 3.9 10*3/uL (ref 1.7–7.7)
Neutrophils Relative %: 52 %
Platelets: 211 10*3/uL (ref 150–400)
RBC: 3.68 MIL/uL — ABNORMAL LOW (ref 3.87–5.11)
RDW: 16.2 % — ABNORMAL HIGH (ref 11.5–15.5)
WBC: 7.6 10*3/uL (ref 4.0–10.5)
nRBC: 0 % (ref 0.0–0.2)

## 2022-04-30 LAB — URINALYSIS, ROUTINE W REFLEX MICROSCOPIC
Bilirubin Urine: NEGATIVE
Glucose, UA: NEGATIVE mg/dL
Hgb urine dipstick: NEGATIVE
Ketones, ur: NEGATIVE mg/dL
Nitrite: NEGATIVE
Protein, ur: NEGATIVE mg/dL
Specific Gravity, Urine: 1.028 (ref 1.005–1.030)
WBC, UA: >50 WBC/hpf — ABNORMAL HIGH (ref 0–5)
pH: 5 (ref 5.0–8.0)

## 2022-04-30 NOTE — ED Triage Notes (Signed)
Patient complains of recurrent groin pain that started approximately four years ago. Patient states she has been seen by multiple nephrologists but she states they are unsure of what is causing the discomfort. Patient states she has been prescribed multiple antibiotics that seem to have no effect on symptoms.

## 2022-04-30 NOTE — ED Provider Triage Note (Signed)
Emergency Medicine Provider Triage Evaluation Note  [name] , a 76 y.o. female  was evaluated in triage.  Pt complains of' \\dysuria'$ .  States she has been seen for this previously.  Has an intermittent for the last few years.  States she does have some right flank pain however this is chronic in nature.  No recent falls or injuries.  No fever, nausea, vomiting, bowel or bladder incontinence, saddle paresthesia.  No history of kidney stones.  Localizes her pain to her urethra.  States she has been seen previously and given multiple rounds of antibiotics without relief  Review of Systems  Positive: Dysuria  Negative: fever  Physical Exam  BP (!) 142/85 (BP Location: Right Arm)   Pulse 94   Temp 98.2 F (36.8 C) (Oral)   Resp 18   SpO2 98%  Gen:   Awake, no distress   Resp:  Normal effort  MSK:   Moves extremities without difficulty, tenderness to right flank (chronic per patient ) Other:    Medical Decision Making  Medically screening exam initiated at 12:14 PM.  Appropriate orders placed.  [name] was informed that the remainder of the evaluation will be completed by another provider, this initial triage assessment does not replace that evaluation, and the importance of remaining in the ED until their evaluation is complete.  dysuria   Haron Beilke A, PA-C 04/30/22 1216

## 2022-05-01 ENCOUNTER — Emergency Department (HOSPITAL_COMMUNITY): Payer: Medicare Other

## 2022-05-01 ENCOUNTER — Telehealth: Payer: Self-pay | Admitting: *Deleted

## 2022-05-01 DIAGNOSIS — N3000 Acute cystitis without hematuria: Secondary | ICD-10-CM | POA: Diagnosis not present

## 2022-05-01 MED ORDER — ONDANSETRON HCL 4 MG/2ML IJ SOLN
4.0000 mg | Freq: Once | INTRAMUSCULAR | Status: AC
  Administered 2022-05-01: 4 mg via INTRAVENOUS
  Filled 2022-05-01: qty 2

## 2022-05-01 MED ORDER — CEPHALEXIN 500 MG PO CAPS: 500.0000 mg | ORAL_CAPSULE | Freq: Three times a day (TID) | ORAL | 0 refills | Status: DC

## 2022-05-01 MED ORDER — LACTATED RINGERS IV BOLUS
1000.0000 mL | Freq: Once | INTRAVENOUS | Status: AC
  Administered 2022-05-01: 1000 mL via INTRAVENOUS

## 2022-05-01 MED ORDER — MORPHINE SULFATE (PF) 4 MG/ML IV SOLN
4.0000 mg | Freq: Once | INTRAVENOUS | Status: AC
  Administered 2022-05-01: 4 mg via INTRAVENOUS
  Filled 2022-05-01: qty 1

## 2022-05-01 NOTE — Telephone Encounter (Signed)
Transition Care Management Follow-up Telephone Call Date of discharge and from where: 05/01/2022 from [facility] ED  How have you been since you were released from the hospital? Patient states she is still not feeling well  Any questions or concerns? No  Items Reviewed: Did the pt receive and understand the discharge instructions provided? Yes  Medications obtained and verified? Yes  Other? No  Any new allergies since your discharge? No  Dietary orders reviewed? No Do you have support at home? No   Home Care and Equipment/Supplies: Were home health services ordered? not applicable If so, what is the name of the agency? N/a  Has the agency set up a time to come to the patient's home? not applicable Were any new equipment or medical supplies ordered?  No What is the name of the medical supply agency? N/a Were you able to get the supplies/equipment? not applicable Do you have any questions related to the use of the equipment or supplies? No  Functional Questionnaire: (I = Independent and D = Dependent) ADLs: I  Bathing/Dressing- I  Meal Prep- I  Eating- I  Maintaining continence- I  Transferring/Ambulation- I  Managing Meds- I  Follow up appointments reviewed:  PCP Hospital f/u appt confirmed? Yes  Scheduled to see Dr. Mitchel Honour on 05/07/2022 @ 1:40pm. [facility] f/u appt confirmed? No Scheduled to see n/a on n/a @ n/a. Are transportation arrangements needed? No  If their condition worsens, is the pt aware to call PCP or go to the Emergency Dept.? Yes Was the patient provided with contact information for the PCP's office or ED? Yes Was to pt encouraged to call back with questions or concerns? Yes

## 2022-05-01 NOTE — ED Provider Notes (Signed)
[facility] EMERGENCY DEPARTMENT Provider Note   CSN: [id number] Arrival date & time: 04/30/22  1107     History  Chief Complaint  Patient presents with   Groin Pain    [name] is a 76 y.o. female.  76 year old female presents today for evaluation of lower abdominal pain primarily in the suprapubic region that has been ongoing for the past week however this pain is intermittent and has been ongoing for several months overall.  She has been evaluated multiple times.  Has previously been diagnosed with UTI and treated with antibiotics without improvement.  Chart review shows previous urine culture without UTI.  Endorses history of kidney stones.  Denies fever, chills, nausea, vomiting, diarrhea, constipation, vaginal discharge.  She is not sexually active.  The history is provided by the patient. No language interpreter was used.       Home Medications Prior to Admission medications   Medication Sig Start Date End Date Taking? Authorizing Provider  acetaminophen (TYLENOL) 500 MG tablet Take 1,000 mg by mouth every 6 (six) hours as needed (back pain.).    [provider]  amitriptyline (ELAVIL) 50 MG tablet Take 1 tablet (50 mg total) by mouth at bedtime. 03/19/22   Horald Pollen, MD  aspirin 81 MG EC tablet Take 81 mg by mouth daily in the afternoon.    [provider]  bisoprolol (ZEBETA) 5 MG tablet TAKE 1 TABLET BY MOUTH DAILY. 07/12/21   [name], MD  dexlansoprazole (DEXILANT) 60 MG capsule Take 1 capsule (60 mg total) by mouth daily. **PLEASE CALL OFFICE TO SCHEDULE FOLLOW UP 03/20/22   Esterwood, Amy S, PA-C  dicyclomine (BENTYL) 10 MG capsule Take 1 capsule (10 mg total) by mouth every 6 (six) hours as needed for spasms. 11/21/21   Horald Pollen, MD  furosemide (LASIX) 40 MG tablet Take 1 tablet (40 mg total) by mouth daily. 12/09/21   Richardson Dopp T, PA-C  hydrALAZINE (APRESOLINE) 10 MG tablet Take 1 tablet (10  mg total) by mouth 3 (three) times daily. 08/13/21 08/13/22  Richardson Dopp T, PA-C  isosorbide mononitrate (IMDUR) 30 MG 24 hr tablet Take 0.5 tablets (15 mg total) by mouth daily. 08/13/21 08/13/22  Richardson Dopp T, PA-C  lidocaine (HM LIDOCAINE PATCH) 4 % Place 1 patch onto the skin daily. 01/27/22   Sherrell Puller, PA-C  nitroGLYCERIN (NITROSTAT) 0.4 MG SL tablet Place 1 tablet (0.4 mg total) under the tongue every 5 (five) minutes as needed for chest pain. 03/29/19 12/25/21  Richardson Dopp T, PA-C  polyethylene glycol powder (GLYCOLAX/MIRALAX) powder Take 17 g by mouth daily as needed for mild constipation. 07/08/17   Daleen Squibb, MD  PROAIR HFA 108 [phone] Base) MCG/ACT inhaler 1 puff daily as needed. 03/27/21   Horald Pollen, MD  rosuvastatin (CRESTOR) 20 MG tablet TAKE 1 TABLET (20 MG TOTAL) BY MOUTH AT BEDTIME. PLEASE MAKE YEARLY APPT WITH DR. Burt Knack FOR OCTOBER 2022 FOR FUTURE REFILLS. 02/18/22   Richardson Dopp T, PA-C  Tiotropium Bromide-Olodaterol (STIOLTO RESPIMAT) 2.5-2.5 MCG/ACT AERS Inhale 2 puffs into the lungs daily. 01/20/22   Hunsucker, Bonna Gains, MD  topiramate (TOPAMAX) 50 MG tablet Take 1 tablet (50 mg total) by mouth daily. 11/13/21   Suzzanne Cloud, NP      Allergies    Sulfa antibiotics, Sulfonamide derivatives, and Zestril [lisinopril]    Review of Systems   Review of Systems  Constitutional:  Negative for chills and fever.  Gastrointestinal:  Positive for abdominal pain. Negative for blood in stool, constipation, diarrhea, nausea and vomiting.  Genitourinary:  Positive for dysuria. Negative for flank pain, vaginal bleeding and vaginal discharge.  All other systems reviewed and are negative.   Physical Exam Updated Vital Signs BP (!) 164/100   Pulse 96   Temp 97.8 F (36.6 C)   Resp 18   SpO2 97%  Physical Exam Vitals and nursing note reviewed.  Constitutional:      General: She is not in acute distress.    Appearance: Normal appearance. She is not  ill-appearing.  HENT:     Head: Normocephalic and atraumatic.     Nose: Nose normal.  Eyes:     Conjunctiva/sclera: Conjunctivae normal.  Cardiovascular:     Rate and Rhythm: Normal rate and regular rhythm.     Pulses: Normal pulses.  Pulmonary:     Effort: Pulmonary effort is normal. No respiratory distress.     Breath sounds: Normal breath sounds. No wheezing or rales.  Abdominal:     General: There is no distension.     Palpations: Abdomen is soft.     Tenderness: There is abdominal tenderness. There is no right CVA tenderness, left CVA tenderness or guarding.  Musculoskeletal:        General: No deformity.  Skin:    Findings: No rash.  Neurological:     Mental Status: She is alert.     ED Results / Procedures / Treatments   Labs (all labs ordered are listed, but only abnormal results are displayed) Labs Reviewed  COMPREHENSIVE METABOLIC PANEL - Abnormal; Notable for the following components:      Result Value   Chloride 115 (*)    CO2 20 (*)    Glucose, Bld 108 (*)    BUN 24 (*)    Creatinine, Ser 1.85 (*)    GFR, Estimated 28 (*)    All other components within normal limits  CBC WITH DIFFERENTIAL/PLATELET - Abnormal; Notable for the following components:   RBC 3.68 (*)    MCV 101.6 (*)    RDW 16.2 (*)    All other components within normal limits  URINALYSIS, ROUTINE W REFLEX MICROSCOPIC - Abnormal; Notable for the following components:   APPearance HAZY (*)    Leukocytes,Ua MODERATE (*)    WBC, UA >50 (*)    Bacteria, UA MANY (*)    All other components within normal limits  URINE CULTURE    EKG None  Radiology CT ABDOMEN PELVIS WO CONTRAST  Result Date: 05/01/2022 CLINICAL DATA:  Acute abdominal pain EXAM: CT ABDOMEN AND PELVIS WITHOUT CONTRAST TECHNIQUE: Multidetector CT imaging of the abdomen and pelvis was performed following the standard protocol without IV contrast. RADIATION DOSE REDUCTION: This exam was performed according to the departmental  dose-optimization program which includes automated exposure control, adjustment of the mA and/or kV according to patient size and/or use of iterative reconstruction technique. COMPARISON:  02/28/2021 FINDINGS: Lower chest: No acute abnormality. Hepatobiliary: Stable cyst is noted in the right lobe of the liver. The liver is otherwise within normal limits. Gallbladder is unremarkable. Pancreas: Unremarkable. No pancreatic ductal dilatation or surrounding inflammatory changes. Spleen: Normal in size without focal abnormality. Adrenals/Urinary Tract: Adrenal glands are within normal limits. Kidneys demonstrate no renal calculi or obstructive changes. Bladder is well distended. Stomach/Bowel: Mild diverticular changes noted without evidence of diverticulitis. The appendix is within normal limits. Small bowel and stomach are unremarkable. Vascular/Lymphatic: Aortic atherosclerotic calcifications are noted.  No adenopathy is seen. Reproductive: Status post hysterectomy. No adnexal masses. Other: No abdominal wall hernia or abnormality. No abdominopelvic ascites. Musculoskeletal: No acute or significant osseous findings. Postsurgical changes are noted in the lower lumbar spine. IMPRESSION: Diverticulosis without diverticulitis. Stable small cyst in the liver. No other focal abnormality is noted. Aortic Atherosclerosis (ICD10-I70.0). Electronically Signed   By: Inez Catalina M.D.   On: 05/01/2022 03:34    Procedures Procedures    Medications Ordered in ED Medications  lactated ringers bolus 1,000 mL (1,000 mLs Intravenous New Bag/Given 05/01/22 0151)  morphine (PF) 4 MG/ML injection 4 mg (4 mg Intravenous Given 05/01/22 0152)  ondansetron (ZOFRAN) injection 4 mg (4 mg Intravenous Given 05/01/22 0152)    ED Course/ Medical Decision Making/ A&P                           Medical Decision Making Amount and/or Complexity of Data Reviewed Labs: ordered. Radiology: ordered.  Risk Prescription drug  management.   Medical Decision Making / ED Course   This patient presents to the ED for concern of abdominal pain, this involves an extensive number of treatment options, and is a complaint that carries with it a high risk of complications and morbidity.  The differential diagnosis includes UTI, pyelonephritis, gastroenteritis, pancreatitis, diverticulitis, appendicitis, cholecystitis  MDM: 76 year old female with above-mentioned complaints presents today for abdominal pain.  This has been on long standing issue for her.  Episodes are intermittent.  Has dysuria.  UA shows signs of UTI.  Will send for urine culture.  Compared to her last UA this has many bacteria.  CBC without leukocytosis or anemia.  Doubt pyelonephritis.  She is afebrile.  Creatinine 1.85 which is around her baseline.  Will provide fluid hydration, pain medication and reevaluate.  CT abdomen pelvis without contrast obtained given her renal function.  Shows no acute process.  Urine culture sent.  Will treat for UTI.  Urology referral given.  Strict return precaution discussed.  Patient voices understanding and is in agreement with plan.   Additional history obtained: -Additional history obtained from previous ED visit. -External records from outside source obtained and reviewed including: Chart review including previous notes, labs, imaging, consultation notes   Lab Tests: -I ordered, reviewed, and interpreted labs.   The pertinent results include:   Labs Reviewed  COMPREHENSIVE METABOLIC PANEL - Abnormal; Notable for the following components:      Result Value   Chloride 115 (*)    CO2 20 (*)    Glucose, Bld 108 (*)    BUN 24 (*)    Creatinine, Ser 1.85 (*)    GFR, Estimated 28 (*)    All other components within normal limits  CBC WITH DIFFERENTIAL/PLATELET - Abnormal; Notable for the following components:   RBC 3.68 (*)    MCV 101.6 (*)    RDW 16.2 (*)    All other components within normal limits  URINALYSIS,  ROUTINE W REFLEX MICROSCOPIC - Abnormal; Notable for the following components:   APPearance HAZY (*)    Leukocytes,Ua MODERATE (*)    WBC, UA >50 (*)    Bacteria, UA MANY (*)    All other components within normal limits  URINE CULTURE      EKG  EKG Interpretation  Date/Time:    Ventricular Rate:    PR Interval:    QRS Duration:   QT Interval:    QTC Calculation:   R Axis:  Text Interpretation:           Imaging Studies ordered: I ordered imaging studies including CT abdomen pelvis without contrast I independently visualized and interpreted imaging. I agree with the radiologist interpretation   Medicines ordered and prescription drug management: Meds ordered this encounter  Medications   lactated ringers bolus 1,000 mL   morphine (PF) 4 MG/ML injection 4 mg   ondansetron (ZOFRAN) injection 4 mg   cephALEXin (KEFLEX) 500 MG capsule    Sig: Take 1 capsule (500 mg total) by mouth 3 (three) times daily for 7 days.    Dispense:  21 capsule    Refill:  0    Order Specific Question:   Supervising Provider    Answer:   Sabra Heck, BRIAN [[id number]]    -I have reviewed the patients home medicines and have made adjustments as needed  Reevaluation: After the interventions noted above, I reevaluated the patient and found that they have :improved  Co morbidities that complicate the patient evaluation  Past Medical History:  Diagnosis Date   Abdominal pain, left lower quadrant 08/14/2009   Qualifier: Diagnosis of  By: Laney Potash, Pam     Abnormal CT scan, stomach    Abnormal LFTs 06/19/2017   AICD (automatic cardioverter/defibrillator) present    Dr.Allred follows   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 followed by Dr. Prescott Gum.   Arthritis    Asthma    Benign neoplasm of colon    CAD (coronary artery disease) 02/2009   a. anterior STEMI rx with BMS to prox LAD in 02/2009. b. ISR s/p PTCA 06/2009. c. ISR s/p thrombectomy & PTCA 03/2010 due to late stent thrombosis. //  Myoview 04/2019: EF 28, ant, ant-sept, inf-sept scar, no ischemia, high risk (stable>>cont med Rx)    Cardiomyopathy, ischemic 06/12/2011   Chronic renal insufficiency    stage 3   Chronic systolic CHF (congestive heart failure) ([place])    a. s/p ST. Jude ICD Oct 23, 2011.   Chronic systolic heart failure ([place]) 08/05/2011   Colon polyp    Complication of anesthesia    COPD (chronic obstructive pulmonary disease) ([place])    CORONARY ARTERY DISEASE 04/24/2009   Qualifier: Diagnosis of  By: Shane Crutch, Amy S    DDD (degenerative disc disease), lumbar 05/16/2011   Depression    "since my son died in Nov 23, 2022" October 22, 2017)   Diverticulosis    DIVERTICULOSIS-COLON 08/14/2009   Qualifier: Diagnosis of  By: Trellis Paganini PA-c, Amy S    DYSPHAGIA UNSPECIFIED 04/24/2009   Qualifier: Diagnosis of  By: Laney Potash, Pam     Dyspnea    "when I lay down at night"    Essential hypertension 09/05/2009   Qualifier: Diagnosis of  By: Harvest Dark CMA, Jennifer     Gastric polyp    GERD 10/19/2007   Qualifier: Diagnosis of  By: Rennie Plowman RN, Blanca Friend: Diagnosis of  By: Laney Potash, Pam     GERD (gastroesophageal reflux disease)    GI bleed    a. h/o GIB on DAPT, now on ASA only.   Headache 01/03/2013   Hearing loss    left ear   History of pulmonary embolus (PE) 01/02/2011   Hyperlipemia    Hyperlipidemia, unspecified 04/24/2009   Qualifier: Diagnosis of  By: Trellis Paganini PA-c, Amy S    Hypertension    ICD-St.Jude 08/06/2011   S/p St. Jude ICD placement 08/05/11    Insomnia    Irritable bowel syndrome  Ischemic cardiomyopathy    EF 10-15%   Memory loss    Migraine headache without aura    Myocardial infarct ([place]) 2011 x 2   Dr. 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  Discharged in stable condition.  Return precaution discussed.  Final Clinical Impression(s) / ED Diagnoses Final diagnoses:  Acute cystitis without hematuria    Rx / DC Orders ED Discharge Orders          Ordered    cephALEXin (KEFLEX) 500 MG capsule  3 times daily        05/01/22 0436              Evlyn Courier, PA-C 05/01/22 [id number]    Maudie Flakes, MD 05/01/22 [id number]

## 2022-05-01 NOTE — Discharge Instructions (Addendum)
Your work-up today is overall reassuring.  CT scan did not show any concerning cause of your pain.  Your urine does show signs of UTI.  I have sent antibiotic into the pharmacy for you.  We have also sent a urine culture to confirm the UTI and as well as to confirm that the antibiotic you have been started on has been sufficient to cover the bacteria.  For any concerning symptoms please return to the emergency room for evaluation.

## 2022-05-02 LAB — URINE CULTURE: Culture: >=100000 — AB

## 2022-05-03 ENCOUNTER — Telehealth (HOSPITAL_BASED_OUTPATIENT_CLINIC_OR_DEPARTMENT_OTHER): Payer: Self-pay | Admitting: *Deleted

## 2022-05-03 NOTE — Telephone Encounter (Signed)
Post ED Visit - Positive Culture Follow-up  Culture report reviewed by antimicrobial stewardship pharmacist: [place] Team '[]'$  Elenor Quinones, Pharm.D. '[]'$  Heide Guile, Pharm.D., BCPS AQ-ID '[]'$  Parks Neptune, Pharm.D., BCPS '[]'$  Alycia Rossetti, Pharm.D., BCPS '[]'$  [place], [place].D., BCPS, AAHIVP '[]'$  Legrand Como, Pharm.D., BCPS, AAHIVP '[]'$  Salome Arnt, PharmD, BCPS '[]'$  Johnnette Gourd, PharmD, BCPS '[]'$  Hughes Better, PharmD, BCPS '[]'$  Leeroy Cha, PharmD '[]'$  Laqueta Linden, PharmD, BCPS '[x]'$  Lorelei Pont, PharmD  [place] Team '[]'$  Leodis Sias, PharmD '[]'$  Lindell Spar, PharmD '[]'$  Royetta Asal, PharmD '[]'$  Graylin Shiver, Rph '[]'$  Rema Fendt) Glennon Mac, PharmD '[]'$  Arlyn Dunning, PharmD '[]'$  Netta Cedars, PharmD '[]'$  Dia Sitter, PharmD '[]'$  Leone Haven, PharmD '[]'$  Gretta Arab, PharmD '[]'$  Theodis Shove, PharmD '[]'$  Peggyann Juba, PharmD '[]'$  Reuel Boom, PharmD   Positive urine culture Treated with Cephalexin, organism sensitive to the same and no further patient follow-up is required at this time.  [name] 05/03/2022, 1:24 PM

## 2022-05-08 ENCOUNTER — Ambulatory Visit (INDEPENDENT_AMBULATORY_CARE_PROVIDER_SITE_OTHER): Payer: Medicare Other | Admitting: Emergency Medicine

## 2022-05-08 ENCOUNTER — Encounter: Payer: Self-pay | Admitting: Emergency Medicine

## 2022-05-08 VITALS — BP 122/78 | HR 67 | Temp 97.8°F | Ht 62.0 in | Wt 170.0 lb

## 2022-05-08 DIAGNOSIS — N390 Urinary tract infection, site not specified: Secondary | ICD-10-CM | POA: Diagnosis not present

## 2022-05-08 DIAGNOSIS — I10 Essential (primary) hypertension: Secondary | ICD-10-CM

## 2022-05-08 DIAGNOSIS — M48061 Spinal stenosis, lumbar region without neurogenic claudication: Secondary | ICD-10-CM | POA: Diagnosis not present

## 2022-05-08 DIAGNOSIS — N1832 Chronic kidney disease, stage 3b: Secondary | ICD-10-CM

## 2022-05-08 DIAGNOSIS — G8929 Other chronic pain: Secondary | ICD-10-CM | POA: Insufficient documentation

## 2022-05-08 DIAGNOSIS — I5020 Unspecified systolic (congestive) heart failure: Secondary | ICD-10-CM

## 2022-05-08 DIAGNOSIS — Z87440 Personal history of urinary (tract) infections: Secondary | ICD-10-CM | POA: Insufficient documentation

## 2022-05-08 DIAGNOSIS — B9689 Other specified bacterial agents as the cause of diseases classified elsewhere: Secondary | ICD-10-CM | POA: Insufficient documentation

## 2022-05-08 DIAGNOSIS — M545 Low back pain, unspecified: Secondary | ICD-10-CM

## 2022-05-08 MED ORDER — CIPROFLOXACIN HCL 500 MG PO TABS: 500.0000 mg | ORAL_TABLET | Freq: Two times a day (BID) | ORAL | 0 refills | Status: AC

## 2022-05-08 MED ORDER — TRAMADOL HCL 50 MG PO TABS: 50.0000 mg | ORAL_TABLET | Freq: Three times a day (TID) | ORAL | 0 refills | Status: AC | PRN

## 2022-05-08 NOTE — Assessment & Plan Note (Signed)
Clinically euvolemic. Continue furosemide 40 mg daily and isosorbide mononitrate 15 mg daily.

## 2022-05-08 NOTE — Progress Notes (Signed)
[name] 76 y.o.   Chief Complaint  Patient presents with   Follow-up    Back pain, elevated BP, urinary frequency, antibiotic not working    HISTORY OF PRESENT ILLNESS: This is a 76 y.o. female here for follow-up of emergency department visit on 04/30/2022 when she presented with back pain. Work-up revealed UTI.  Urine culture positive for Klebsiella.  Patient was started on Keflex 500 mg 3 times a day but still having urinary symptoms. Patient has history of chronic lumbar pain and spinal stenosis with degenerative changes, lumbar surgery in the past.  Still complaining of severe lumbar pain. No other associated symptoms. No other complaints or medical concerns today. States blood pressure was very elevated when she presented to the emergency department but today it is normal.  HPI   Prior to Admission medications   Medication Sig Start Date End Date Taking? Authorizing Provider  acetaminophen (TYLENOL) 500 MG tablet Take 1,000 mg by mouth every 6 (six) hours as needed (back pain.).   Yes [provider]  amitriptyline (ELAVIL) 50 MG tablet Take 1 tablet (50 mg total) by mouth at bedtime. 03/19/22  Yes SagardiaInes Bloomer, MD  aspirin 81 MG EC tablet Take 81 mg by mouth daily in the afternoon.   Yes [provider]  bisoprolol (ZEBETA) 5 MG tablet TAKE 1 TABLET BY MOUTH DAILY. 07/12/21  Yes [name], MD  cephALEXin (KEFLEX) 500 MG capsule Take 1 capsule (500 mg total) by mouth 3 (three) times daily for 7 days. 05/01/22 05/08/22 Yes Ali, Amjad, PA-C  ciprofloxacin (CIPRO) 500 MG tablet Take 1 tablet (500 mg total) by mouth 2 (two) times daily for 5 days. 05/08/22 05/13/22 Yes Pharrah Rottman, Ines Bloomer, MD  dexlansoprazole (DEXILANT) 60 MG capsule Take 1 capsule (60 mg total) by mouth daily. **PLEASE CALL OFFICE TO SCHEDULE FOLLOW UP 03/20/22  Yes Esterwood, Amy S, PA-C  dicyclomine (BENTYL) 10 MG capsule Take 1 capsule (10 mg total) by mouth every 6 (six)  hours as needed for spasms. 11/21/21  Yes Taha Dimond, Ines Bloomer, MD  furosemide (LASIX) 40 MG tablet Take 1 tablet (40 mg total) by mouth daily. 12/09/21  Yes Weaver, Scott T, PA-C  hydrALAZINE (APRESOLINE) 10 MG tablet Take 1 tablet (10 mg total) by mouth 3 (three) times daily. 08/13/21 08/13/22 Yes Weaver, Scott T, PA-C  isosorbide mononitrate (IMDUR) 30 MG 24 hr tablet Take 0.5 tablets (15 mg total) by mouth daily. 08/13/21 08/13/22 Yes Weaver, Scott T, PA-C  lidocaine (HM LIDOCAINE PATCH) 4 % Place 1 patch onto the skin daily. 01/27/22  Yes Sherrell Puller, PA-C  polyethylene glycol powder (GLYCOLAX/MIRALAX) powder Take 17 g by mouth daily as needed for mild constipation. 07/08/17  Yes Jacelyn Pi, Lilia Argue, MD  [facility] HFA [id number] [phone] Base) MCG/ACT inhaler 1 puff daily as needed. 03/27/21  Yes Lorenso Quirino, Ines Bloomer, MD  rosuvastatin (CRESTOR) 20 MG tablet TAKE 1 TABLET (20 MG TOTAL) BY MOUTH AT BEDTIME. PLEASE MAKE YEARLY APPT WITH DR. Burt Knack FOR OCTOBER 2022 FOR FUTURE REFILLS. 02/18/22  Yes Weaver, Scott T, PA-C  Tiotropium Bromide-Olodaterol (STIOLTO RESPIMAT) 2.5-2.5 MCG/ACT AERS Inhale 2 puffs into the lungs daily. 01/20/22  Yes Hunsucker, Bonna Gains, MD  topiramate (TOPAMAX) 50 MG tablet Take 1 tablet (50 mg total) by mouth daily. 11/13/21  Yes Suzzanne Cloud, NP  nitroGLYCERIN (NITROSTAT) 0.4 MG SL tablet Place 1 tablet (0.4 mg total) under the tongue every 5 (five) minutes as needed for chest pain. 03/29/19 12/25/21  Kathlen Mody, Scott T, PA-C  traMADol (ULTRAM) 50 MG tablet Take 1 tablet (50 mg total) by mouth every 8 (eight) hours as needed for up to 5 days. 05/08/22 05/13/22 Yes CheritonInes Bloomer, MD    Allergies  Allergen Reactions   Sulfa Antibiotics Other (See Comments)    Unknown, childhood allergy    Sulfonamide Derivatives Other (See Comments)    UNSURE   Zestril [Lisinopril] Cough    Patient Active Problem List   Diagnosis Date Noted   Acute otalgia, left 03/19/2022   Ear infection  03/19/2022   HFrEF (heart failure with reduced ejection fraction) ([place]) 12/02/2021   Stage 3b chronic kidney disease ([place]) 08/08/2021   Chronic insomnia 11/14/2019   Colon polyp 06/16/2018   Thoracic aortic aneurysm without rupture ([place]) 03/31/2018   Chronic renal insufficiency    AICD (automatic cardioverter/defibrillator) present    Ischemic cardiomyopathy    COPD (chronic obstructive pulmonary disease) ([place])    Hyperlipemia    Insomnia    Pre-diabetes 06/19/2017   Gastric polyp    Osteoporosis 05/11/2013   Hearing loss 04/29/2013   Mild cognitive impairment 04/29/2013   Pseudoaneurysm of aortic arch ([place]) 05/05/2012   Implantable cardioverter-defibrillator (ICD) in situ 08/06/2011   DDD (degenerative disc disease), lumbar 05/16/2011   History of pulmonary embolus (PE) 01/02/2011   UNSPECIFIED ANEMIA 07/03/2010   Essential hypertension 09/05/2009   Shortness of breath 08/30/2009   DIVERTICULOSIS-COLON 08/14/2009   Generalized abdominal pain 08/14/2009   PERSONAL HX COLONIC POLYPS 04/24/2009   TOBACCO ABUSE 04/04/2009   Coronary artery disease involving native coronary artery of native heart with angina pectoris ([place]) 02/25/2009   GERD 10/19/2007   IBS 10/19/2007   COLONIC POLYPS, ADENOMATOUS 05/12/2007    Past Medical History:  Diagnosis Date   Abdominal pain, left lower quadrant 08/14/2009   Qualifier: Diagnosis of  By: Laney Potash, Pam     Abnormal CT scan, stomach    Abnormal LFTs 06/19/2017   AICD (automatic cardioverter/defibrillator) present    Dr.Allred follows   Aortic arch pseudoaneurysm ([place])    a. followed by Dr. Prescott Gum.   Arthritis    Asthma    Benign neoplasm of colon    CAD (coronary artery disease) 02/2009   a. anterior STEMI rx with BMS to prox LAD in 02/2009. b. ISR s/p PTCA 06/2009. c. ISR s/p thrombectomy & PTCA 03/2010 due to late stent thrombosis. // Myoview 04/2019: EF 28, ant, ant-sept, inf-sept scar, no ischemia, high risk (stable>>cont med  Rx)    Cardiomyopathy, ischemic 06/12/2011   Chronic renal insufficiency    stage 3   Chronic systolic CHF (congestive heart failure) ([place])    a. s/p ST. Jude ICD October 28, 2011.   Chronic systolic heart failure ([place]) 08/05/2011   Colon polyp    Complication of anesthesia    COPD (chronic obstructive pulmonary disease) ([place])    CORONARY ARTERY DISEASE 04/24/2009   Qualifier: Diagnosis of  By: Shane Crutch, Amy S    DDD (degenerative disc disease), lumbar 05/16/2011   Depression    "since my son died in 11/28/22" 10/27/2017)   Diverticulosis    DIVERTICULOSIS-COLON 08/14/2009   Qualifier: Diagnosis of  By: Trellis Paganini PA-c, Amy S    DYSPHAGIA UNSPECIFIED 04/24/2009   Qualifier: Diagnosis of  By: Laney Potash, Pam     Dyspnea    "when I lay down at night"    Essential hypertension 09/05/2009   Qualifier: Diagnosis of  By: Harvest Dark CMA, Anderson Malta  Gastric polyp    GERD 10/19/2007   Qualifier: Diagnosis of  By: Rennie Plowman RN, Blanca Friend: Diagnosis of  By: Laney Potash, Pam     GERD (gastroesophageal reflux disease)    GI bleed    a. h/o GIB on DAPT, now on ASA only.   Headache 01/03/2013   Hearing loss    left ear   History of pulmonary embolus (PE) 01/02/2011   Hyperlipemia    Hyperlipidemia, unspecified 04/24/2009   Qualifier: Diagnosis of  By: Trellis Paganini PA-c, Amy S    Hypertension    ICD-St.Jude 08/06/2011   S/p St. Jude ICD placement 08/05/11    Insomnia    Irritable bowel syndrome    Ischemic cardiomyopathy    EF 10-15%   Memory loss    Migraine headache without aura    Myocardial infarct ([place]) 2011 x 2   Dr. 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[place]) 05/05/2012   Pulmonary embolism ([place]) 2011   Stroke [facility])    'When I was young." no  residual   Thoracic aortic aneurysm ([place]) 03/31/2018   TOBACCO ABUSE 04/04/2009   Qualifier: Diagnosis of  By: Arvid Right  Qualifier: Diagnosis of  By: [name], MD, Jaquelyn Bitter    Transfusion history    ?'12 or '13   UNSPECIFIED ANEMIA 07/03/2010   Qualifier: Diagnosis of  By: Shelda Pal     Unspecified mastoiditis     Past Surgical History:  Procedure Laterality Date   ANGIOPLASTY  07/02/09, 04/01/10   BILATERAL SALPINGOOPHORECTOMY     CAD( bare metal stent)  02/2009   x 1   CAROTID-SUBCLAVIAN BYPASS GRAFT Left 03/31/2018   Procedure: LEFT SUBCLAVIAN ARTERY BYPASS GRAFT;  Surgeon: Serafina Mitchell, MD;  Location: [facility] OR;  Service: Vascular;  Laterality: Left;   CERVICAL SPINE SURGERY  08/08   COLONOSCOPY WITH PROPOFOL N/A 04/29/2016   Procedure: COLONOSCOPY WITH PROPOFOL;  Surgeon: [name], MD;  Location: [facility] END[facility]COPY;  Service: Gastroenterology;  Laterality: N/A;   ESOPHAGOGASTRODUODENOSCOPY N/A 12/09/2016   Procedure: ESOPHAGOGASTRODUODENOSCOPY (EGD);  Surgeon: [name], MD;  Location: [facility];  Service: Gastroenterology;  Laterality: N/A;   ESOPHAGOGASTRODUODENOSCOPY (EGD) WITH PROPOFOL N/A 04/29/2016   Procedure: ESOPHAGOGASTRODUODENOSCOPY (EGD) WITH PROPOFOL;  Surgeon: [name], MD;  Location: [facility];  Service: Gastroenterology;  Laterality: N/A;   EUS N/A 05/15/2016   Procedure: UPPER ENDOSCOPIC ULTRASOUND (EUS) RADIAL;  Surgeon: Milus Banister, MD;  Location: [facility] END[facility];  Service: Endoscopy;  Laterality: N/A;   IMPLANTABLE CARDIOVERTER DEFIBRILLATOR IMPLANT N/A 08/05/2011   Primary prevention SJM ICD implanted,  Analyze ST study patient   INNER EAR SURGERY     left x 17   LUMBAR DISC SURGERY  02/2008   fusion   NASAL SEPTUM SURGERY     RIGHT HEART CATH N/A 12/11/2021   Procedure: RIGHT HEART CATH;  Surgeon: [name], MD;  Location: [facility];  Service: Cardiovascular;  Laterality: N/A;    THORACIC AORTIC ENDOVASCULAR STENT GRAFT N/A 03/31/2018   Procedure: THORACIC AORTIC ENDOVASCULAR STENT GRAFT;  Surgeon: Serafina Mitchell, MD;  Location: [facility];  Service: Vascular;  Laterality: N/A;   TOTAL ABDOMINAL HYSTERECTOMY  complete    Social History   Socioeconomic History   Marital status: Divorced    Spouse name: Not on file   Number of children: 2   Years of education: 11   Highest education level: Not on file  Occupational History   Occupation: Unemployed    Fish farm manager: UNEMPLOYED    Employer: DISABLED  Tobacco Use   Smoking status: Former    Packs/day: 0.13    Years: 30.00    Total pack years: 3.90    Types: Cigarettes   Smokeless tobacco: Never  Vaping Use   Vaping Use: Never used  Substance and Sexual Activity   Alcohol use: No    Alcohol/week: 0.0 standard drinks of alcohol   Drug use: No   Sexual activity: Not on file  Other Topics Concern   Not on file  Social History Narrative   Pt lives in [place] alone.  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   Patient has 11 th grade education.Right handed.   Caffeine- one cup daily         Social Determinants of Health   Financial Resource Strain: Low Risk  (12/13/2021)   Overall Financial Resource Strain (CARDIA)    Difficulty of Paying Living Expenses: Not hard at all  Food Insecurity: No Food Insecurity (12/13/2021)   Hunger Vital Sign    Worried About Running Out of Food in the Last Year: Never true    Ran Out of Food in the Last Year: Never true  Transportation Needs: No Transportation Needs (12/13/2021)   PRAPARE - Hydrologist (Medical): No    Lack of Transportation (Non-Medical): No  Physical Activity: Inactive (12/13/2021)   Exercise Vital Sign    Days of Exercise per Week: 0 days    Minutes of Exercise per Session: 0 min  Stress: No Stress Concern Present (12/13/2021)   [place]    Feeling  of Stress : Not at all  Social Connections: Moderately Isolated (12/13/2021)   Social Connection and Isolation Panel [NHANES]    Frequency of Communication with Friends and Family: Three times a week    Frequency of Social Gatherings with Friends and Family: Three times a week    Attends Religious Services: 1 to 4 times per year    Active Member of Clubs or Organizations: No    Attends Archivist Meetings: Never    Marital Status: Divorced  Human resources officer Violence: Not At Risk (12/13/2021)   Humiliation, Afraid, Rape, and Kick questionnaire    Fear of Current or Ex-Partner: No    Emotionally Abused: No    Physically Abused: No    Sexually Abused: No    Family History  Problem Relation Age of Onset   Colon cancer Mother    Other Brother        tumor in his brain   Bladder Cancer Brother    Throat cancer Brother    Cancer Son        kidney, spread to his liver   Breast cancer Cousin      Review of Systems  Constitutional: Negative.  Negative for chills and fever.  HENT: Negative.  Negative for congestion and sore throat.   Respiratory: Negative.  Negative for cough and shortness of breath.   Cardiovascular: Negative.  Negative for chest pain and palpitations.  Gastrointestinal:  Negative for abdominal pain, diarrhea, nausea and vomiting.  Genitourinary:  Positive for dysuria.  Musculoskeletal:  Positive for back pain.  Skin: Negative.  Negative for rash.  Neurological: Negative.  Negative for dizziness and headaches.  All other systems reviewed and are negative.  Today's Vitals   05/08/22 1341  BP: 122/78  Pulse: 67  Temp: 97.8 F (36.6 C)  TempSrc: Oral  SpO2: 98%  Weight: 170 lb (77.1 kg)  Height: '5\' 2"'$  (1.575 m)   Body mass index is 31.09 kg/m.   Physical Exam Vitals reviewed.  Constitutional:      Appearance: Normal appearance.  HENT:     Head: Normocephalic.     Mouth/Throat:     Mouth: Mucous membranes are moist.     Pharynx: Oropharynx  is clear.  Eyes:     Extraocular Movements: Extraocular movements intact.     Conjunctiva/sclera: Conjunctivae normal.     Pupils: Pupils are equal, round, and reactive to light.  Cardiovascular:     Rate and Rhythm: Normal rate and regular rhythm.     Pulses: Normal pulses.     Heart sounds: Normal heart sounds.  Pulmonary:     Effort: Pulmonary effort is normal.     Breath sounds: Normal breath sounds.  Abdominal:     Palpations: Abdomen is soft.     Tenderness: There is no abdominal tenderness. There is no right CVA tenderness or left CVA tenderness.  Musculoskeletal:     Lumbar back: Spasms and tenderness present. No bony tenderness. Decreased range of motion.  Skin:    General: Skin is warm and dry.  Neurological:     General: No focal deficit present.     Mental Status: She is alert and oriented to person, place, and time.  Psychiatric:        Mood and Affect: Mood normal.        Behavior: Behavior normal.      ASSESSMENT & PLAN: A total of 47 minutes was spent with the patient and counseling/coordination of care regarding preparing for this visit, review of most recent office visit notes, review of most recent emergency department visit notes, review of most recent blood work results and urine culture results, review of multiple chronic medical problems and their management, review of all medications, diagnosis of partially treated urinary tract infection secondary to Klebsiella and need to change antibiotic, prognosis, documentation, need for follow-up.  Problem List Items Addressed This Visit       Cardiovascular and Mediastinum   HFrEF (heart failure with reduced ejection fraction) (HCC) (Chronic)    Clinically euvolemic. Continue furosemide 40 mg daily and isosorbide mononitrate 15 mg daily.      Essential hypertension    Well-controlled hypertension. Continue bisoprolol 5 mg daily, furosemide 40 mg and hydralazine 10 mg 3 times a day.        Genitourinary    Stage 3b chronic kidney disease ([place])    Advised to stay well-hydrated and avoid NSAIDs.      Urinary tract infection due to Klebsiella species - Primary    Partially treated with Keflex.  Urine culture repeated today. Start Cipro 500 mg twice a day for 5 days.      Relevant Medications   ciprofloxacin (CIPRO) 500 MG tablet   Other Relevant Orders   Urinalysis   Urine Culture     Other   Recent urinary tract infection    Still having dysuria symptoms. Klebsiella infection partially treated with cephalexin. We will stop Keflex today and start Cipro 500 mg twice a day for 5 days.  Urine culture repeated today.      Relevant Medications   ciprofloxacin (CIPRO) 500 MG tablet   Other Relevant Orders   Urinalysis   Urine Culture   Chronic bilateral low back pain without sciatica   Relevant Medications   traMADol (ULTRAM) 50 MG tablet   Spinal stenosis of lumbar region without neurogenic claudication    Severe and affecting quality of life. Recent lumbar MRI reviewed. Chronic lumbar pain as a result. Will benefit from tramadol as needed.      Relevant Medications   traMADol (ULTRAM) 50 MG tablet   Patient Instructions  Spinal Stenosis  Spinal stenosis happens when the spinal canal gets smaller. The spinal canal is the space between the bones of your spine (vertebrae). As the canal gets smaller, the nerves that pass that part of the spine are pressed. This causes pain, weakness, or loss of feeling (numbness) in your arms or legs. Spinal stenosis can affect your neck, upper back, or lower back. What are the causes? This condition is caused by parts of bone that push into your spinal canal. This problem may be present at birth. If it occurs after birth, the cause may be: Breakdown of bones of your spine. This normally starts between 51 and 33 years of age. Injury to your spine. Any surgeries you have had to your spine. Tumors in your spine. A buildup of calcium in your  spine. What increases the risk? You are more likely to develop this condition if: You are older than age 51. You were born with a problem in your spine, such as a curved spine (scoliosis). You have arthritis. This is disease of your joints. What are the signs or symptoms? Common symptoms of this condition include: Pain in your neck or back. The pain may be worse when you stand or walk. Problems with your legs or arms. A leg or arm may lose feeling, tingle, or turn hot or cold. This can happen in one arm or leg, or both. Pain that goes from your butt down to your lower leg (sciatica). This can happen in one or both legs. Falling a lot. Foot drop. This is when you have trouble lifting the front part of your foot and it drags on the ground when you walk. Severe symptoms of this condition include: Problems pooping (having a bowel movement) or peeing (urinating). Trouble having sex. Loss of feeling in your leg. Being unable to walk. Symptoms may come on slowly and get worse over time. Sometimes there are no symptoms. How is this treated? To treat pain and manage symptoms, you may be asked to: Practice sitting and standing up straight. This is good posture. Do exercises. Lose weight, if needed. Take medicines or get shots. Wear a corset or a brace. This supports your back. In some cases, you may need to have surgery. Follow these instructions at home: Managing pain, stiffness, and swelling  Stand and sit up straight. If you have a brace or a corset, wear it as told by your doctor. Keep a healthy weight. Talk with your doctor if you need help losing weight. If told, put heat on the affected area. Do this as often as told by your doctor. Use the heat source that your doctor recommends, such as a moist heat pack or a heating pad. Place a towel between your skin and the heat source. Leave the heat on for 20-30 minutes. If your skin turns bright red, take off the heat right away to prevent  burns. The risk of burns is higher if you cannot feel pain, heat, or cold. Activity Do exercises as told by your doctor. Do not do anything that causes pain. Ask your doctor what activities are safe for you. You may have to avoid lifting. Ask your doctor how much you can lift. Return to your normal activities when your doctor says that it is safe. General instructions Take over-the-counter and prescription medicines only as told by your doctor. Do not smoke or use any products that contain nicotine or tobacco. If you need help quitting, ask your doctor. Eat a healthy diet. Eat a lot of: Fruits. Vegetables. Whole grains. Low-fat (lean) protein. Where to find more information [employer] of Arthritis and Musculoskeletal and Skin Diseases: www.niams.SouthExposed.es Contact a doctor if: Your symptoms do not get better. Your symptoms get worse. You have a fever. Get help right away if: You have new pain or very bad pain in your neck or upper back. You have very bad pain, and medicine does not help. You have a very bad headache. You are dizzy. You do not see well. You vomit or feel like you may vomit. You have these things in your back or legs: New or worse loss of feeling. New or worse tingling. You cannot control when you poop or pee. Your arm or leg: Hurts or swells. Turns red. Feels warm. These symptoms may be an emergency. Get help right away. Call 911. Do not wait to see if the symptoms will go away. Do not drive yourself to the hospital. Summary Spinal stenosis happens when the space between the bones of the spine gets smaller. This condition may be present at birth. Spinal stenosis can cause pain in the neck, back, legs, or butt. Treatment for this condition focuses on lessening your pain and other symptoms. This information is not intended to replace advice given to you by your health care provider. Make sure you discuss any questions you have with your health care  provider. Document Revised: 10/21/2021 Document Reviewed: 10/08/2021 Elsevier Patient Education  [place], MD [facility]

## 2022-05-08 NOTE — Patient Instructions (Signed)
Spinal Stenosis  Spinal stenosis happens when the spinal canal gets smaller. The spinal canal is the space between the bones of your spine (vertebrae). As the canal gets smaller, the nerves that pass that part of the spine are pressed. This causes pain, weakness, or loss of feeling (numbness) in your arms or legs. Spinal stenosis can affect your neck, upper back, or lower back. What are the causes? This condition is caused by parts of bone that push into your spinal canal. This problem may be present at birth. If it occurs after birth, the cause may be: Breakdown of bones of your spine. This normally starts between 50 and 60 years of age. Injury to your spine. Any surgeries you have had to your spine. Tumors in your spine. A buildup of calcium in your spine. What increases the risk? You are more likely to develop this condition if: You are older than age 50. You were born with a problem in your spine, such as a curved spine (scoliosis). You have arthritis. This is disease of your joints. What are the signs or symptoms? Common symptoms of this condition include: Pain in your neck or back. The pain may be worse when you stand or walk. Problems with your legs or arms. A leg or arm may lose feeling, tingle, or turn hot or cold. This can happen in one arm or leg, or both. Pain that goes from your butt down to your lower leg (sciatica). This can happen in one or both legs. Falling a lot. Foot drop. This is when you have trouble lifting the front part of your foot and it drags on the ground when you walk. Severe symptoms of this condition include: Problems pooping (having a bowel movement) or peeing (urinating). Trouble having sex. Loss of feeling in your leg. Being unable to walk. Symptoms may come on slowly and get worse over time. Sometimes there are no symptoms. How is this treated? To treat pain and manage symptoms, you may be asked to: Practice sitting and standing up straight. This is  good posture. Do exercises. Lose weight, if needed. Take medicines or get shots. Wear a corset or a brace. This supports your back. In some cases, you may need to have surgery. Follow these instructions at home: Managing pain, stiffness, and swelling  Stand and sit up straight. If you have a brace or a corset, wear it as told by your doctor. Keep a healthy weight. Talk with your doctor if you need help losing weight. If told, put heat on the affected area. Do this as often as told by your doctor. Use the heat source that your doctor recommends, such as a moist heat pack or a heating pad. Place a towel between your skin and the heat source. Leave the heat on for 20-30 minutes. If your skin turns bright red, take off the heat right away to prevent burns. The risk of burns is higher if you cannot feel pain, heat, or cold. Activity Do exercises as told by your doctor. Do not do anything that causes pain. Ask your doctor what activities are safe for you. You may have to avoid lifting. Ask your doctor how much you can lift. Return to your normal activities when your doctor says that it is safe. General instructions Take over-the-counter and prescription medicines only as told by your doctor. Do not smoke or use any products that contain nicotine or tobacco. If you need help quitting, ask your doctor. Eat a healthy diet. Eat   a lot of: Fruits. Vegetables. Whole grains. Low-fat (lean) protein. Where to find more information National Institute of Arthritis and Musculoskeletal and Skin Diseases: www.niams.nih.gov Contact a doctor if: Your symptoms do not get better. Your symptoms get worse. You have a fever. Get help right away if: You have new pain or very bad pain in your neck or upper back. You have very bad pain, and medicine does not help. You have a very bad headache. You are dizzy. You do not see well. You vomit or feel like you may vomit. You have these things in your back or  legs: New or worse loss of feeling. New or worse tingling. You cannot control when you poop or pee. Your arm or leg: Hurts or swells. Turns red. Feels warm. These symptoms may be an emergency. Get help right away. Call 911. Do not wait to see if the symptoms will go away. Do not drive yourself to the hospital. Summary Spinal stenosis happens when the space between the bones of the spine gets smaller. This condition may be present at birth. Spinal stenosis can cause pain in the neck, back, legs, or butt. Treatment for this condition focuses on lessening your pain and other symptoms. This information is not intended to replace advice given to you by your health care provider. Make sure you discuss any questions you have with your health care provider. Document Revised: 10/21/2021 Document Reviewed: 10/08/2021 Elsevier Patient Education  2023 Elsevier Inc.  

## 2022-05-08 NOTE — Assessment & Plan Note (Signed)
Still having dysuria symptoms. Klebsiella infection partially treated with cephalexin. We will stop Keflex today and start Cipro 500 mg twice a day for 5 days. Urine culture repeated today.

## 2022-05-08 NOTE — Assessment & Plan Note (Signed)
Severe and affecting quality of life. Recent lumbar MRI reviewed. Chronic lumbar pain as a result. Will benefit from tramadol as needed.

## 2022-05-08 NOTE — Assessment & Plan Note (Signed)
Advised to stay well-hydrated and avoid NSAIDs. ?

## 2022-05-08 NOTE — Assessment & Plan Note (Signed)
Partially treated with Keflex.  Urine culture repeated today. Start Cipro 500 mg twice a day for 5 days.

## 2022-05-08 NOTE — Assessment & Plan Note (Signed)
Well-controlled hypertension. Continue bisoprolol 5 mg daily, furosemide 40 mg and hydralazine 10 mg 3 times a day.

## 2022-05-09 ENCOUNTER — Other Ambulatory Visit: Payer: Self-pay | Admitting: Physician Assistant

## 2022-05-09 DIAGNOSIS — K219 Gastro-esophageal reflux disease without esophagitis: Secondary | ICD-10-CM

## 2022-06-09 ENCOUNTER — Other Ambulatory Visit: Payer: Self-pay | Admitting: Physician Assistant

## 2022-06-09 DIAGNOSIS — K219 Gastro-esophageal reflux disease without esophagitis: Secondary | ICD-10-CM

## 2022-06-09 NOTE — Telephone Encounter (Signed)
Please advise patient's refill request for Dexilant.

## 2022-06-13 ENCOUNTER — Telehealth: Payer: Self-pay | Admitting: *Deleted

## 2022-06-13 NOTE — Telephone Encounter (Signed)
Call placed to pt regarding message below:  no answer / no voicemail   Reviewed chart. Based upon renal function, the pt should be on no more than Crestor 10 once daily. PLAN: Reduce Crestor to 10 mg once daily Arrange fasting lipids in 6 mos Richardson Dopp, PA-C   06/13/2022 1:26 PM

## 2022-06-18 ENCOUNTER — Other Ambulatory Visit: Payer: Self-pay | Admitting: Cardiovascular Disease

## 2022-06-27 ENCOUNTER — Ambulatory Visit: Payer: Medicare Other | Attending: Cardiovascular Disease | Admitting: Cardiovascular Disease

## 2022-06-27 ENCOUNTER — Encounter: Payer: Self-pay | Admitting: Cardiovascular Disease

## 2022-06-27 VITALS — BP 146/82 | HR 87 | Ht 62.0 in | Wt 167.8 lb

## 2022-06-27 DIAGNOSIS — I5020 Unspecified systolic (congestive) heart failure: Secondary | ICD-10-CM | POA: Diagnosis not present

## 2022-06-27 DIAGNOSIS — E782 Mixed hyperlipidemia: Secondary | ICD-10-CM | POA: Diagnosis not present

## 2022-06-27 DIAGNOSIS — I25119 Atherosclerotic heart disease of native coronary artery with unspecified angina pectoris: Secondary | ICD-10-CM | POA: Diagnosis not present

## 2022-06-27 DIAGNOSIS — N1832 Chronic kidney disease, stage 3b: Secondary | ICD-10-CM | POA: Diagnosis not present

## 2022-06-27 NOTE — Progress Notes (Unsigned)
Cardiology Office Note:    Date:  06/28/2022   ID:  [name], DOB 31-Oct-1945, MRN [id number]  PCP:  Horald Pollen, [facility] Cardiologist:  [name], MD Electrophysiologist:  Thompson Grayer, MD     Referring MD: Horald Pollen, *   Chief Complaint  Patient presents with   Shortness of Breath    History of Present Illness:    [name] is a 76 y.o. female with a hx of: Coronary artery disease  S/p anterior MI in October 12, 2008 >> PCI: BMS to prox LAD S/p multiple PCIs since MI in October 12, 2008 2/2 ISR Myoview 04/2019: Lg scar, no ischemia, EF 28 Chronic systolic CHF Ischemic CM S/p AICD Hx of pulmonary embolism  COPD Chronic kidney disease  Prior Gi bleeding  Hypertension  Hyperlipidemia  Thoracic aortic aneurysm S/p L carotid-subclavian transposition and endovascular repair of aneurysm 03/2018  Aortic atherosclerosis  The patient is here alone today. She has been doing well. Exertional dyspnea is stable, occurs with any moderate level activity. She reports occasional orthopnea, no PND. No chest pain. Denies leg edema. States she is compliant with her medications. No lightheadedness or syncope reported.   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 followed by Dr. Prescott Gum.   Arthritis    Asthma    Benign neoplasm of colon    CAD (coronary artery disease) 02/2009   a. anterior STEMI rx with BMS to prox LAD in 02/2009. b. ISR s/p PTCA 06/2009. c. ISR s/p thrombectomy & PTCA 03/2010 due to late stent thrombosis. // Myoview 04/2019: EF 28, ant, ant-sept, inf-sept scar, no ischemia, high risk (stable>>cont med Rx)    Cardiomyopathy, ischemic 06/12/2011   Chronic  renal insufficiency    stage 3   Chronic systolic CHF (congestive heart failure) ([place])    a. s/p ST. Jude ICD October 13, 2011.   Chronic systolic heart failure ([place]) 08/05/2011   Colon polyp    Complication of anesthesia    COPD (chronic obstructive pulmonary disease) ([place])    CORONARY ARTERY DISEASE 04/24/2009   Qualifier: Diagnosis of  By: Shane Crutch, Amy S    DDD (degenerative disc disease), lumbar 05/16/2011   Depression    "since my son died in 11-13-2022" 2017-10-12)   Diverticulosis    DIVERTICULOSIS-COLON 08/14/2009   Qualifier: Diagnosis of  By: Trellis Paganini PA-c, Amy S    DYSPHAGIA UNSPECIFIED 04/24/2009   Qualifier: Diagnosis of  By: Laney Potash, Pam     Dyspnea    "when I lay down at night"    Essential hypertension 09/05/2009   Qualifier: Diagnosis of  By: Harvest Dark CMA, Jennifer     Gastric polyp    GERD 10/19/2007   Qualifier: Diagnosis of  By: Rennie Plowman RN, Blanca Friend: Diagnosis of  By: Laney Potash, Pam     GERD (gastroesophageal reflux disease)    GI bleed    a. h/o GIB on DAPT, now on ASA only.   Headache 01/03/2013   Hearing loss    left ear   History of pulmonary embolus (PE) 01/02/2011   Hyperlipemia    Hyperlipidemia, unspecified 04/24/2009   Qualifier: Diagnosis of  By: Shane Crutch, Amy S    Hypertension    ICD-St.Jude 08/06/2011   S/p St. Jude ICD placement 08/05/11    Insomnia    Irritable bowel syndrome    Ischemic cardiomyopathy    EF 10-15%   Memory loss    Migraine headache without aura    Myocardial infarct ([place]) 2011 x 2   Dr. 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[place]) 05/05/2012   Pulmonary embolism ([place]) 2011   Stroke [facility])    'When I was young." no residual   Thoracic aortic aneurysm ([place]) 03/31/2018    TOBACCO ABUSE 04/04/2009   Qualifier: Diagnosis of  By: Arvid Right  Qualifier: Diagnosis of  By: [name], MD, Jaquelyn Bitter    Transfusion history    ?'12 or '13   UNSPECIFIED ANEMIA 07/03/2010   Qualifier: Diagnosis of  By: Shelda Pal     Unspecified mastoiditis     Past Surgical History:  Procedure Laterality Date   ANGIOPLASTY  07/02/09, 04/01/10   BILATERAL SALPINGOOPHORECTOMY     CAD( bare metal stent)  02/2009   x 1   CAROTID-SUBCLAVIAN BYPASS GRAFT Left 03/31/2018   Procedure: LEFT SUBCLAVIAN ARTERY BYPASS GRAFT;  Surgeon: Serafina Mitchell, MD;  Location: [facility] OR;  Service: Vascular;  Laterality: Left;   CERVICAL SPINE SURGERY  08/08   COLONOSCOPY WITH PROPOFOL N/A 04/29/2016   Procedure: COLONOSCOPY WITH PROPOFOL;  Surgeon: [name], MD;  Location: [facility] END[facility];  Service: Gastroenterology;  Laterality: N/A;   ESOPHAGOGASTRODUODENOSCOPY N/A 12/09/2016   Procedure: ESOPHAGOGASTRODUODENOSCOPY (EGD);  Surgeon: [name], MD;  Location: [facility];  Service: Gastroenterology;  Laterality: N/A;   ESOPHAGOGASTRODUODENOSCOPY (EGD) WITH PROPOFOL N/A 04/29/2016   Procedure: ESOPHAGOGASTRODUODENOSCOPY (EGD) WITH PROPOFOL;  Surgeon: [name], MD;  Location: [facility];  Service: Gastroenterology;  Laterality: N/A;   EUS N/A 05/15/2016   Procedure: UPPER ENDOSCOPIC ULTRASOUND (EUS) RADIAL;  Surgeon: Milus Banister, MD;  Location: [facility] END[facility];  Service: Endoscopy;  Laterality: N/A;   IMPLANTABLE CARDIOVERTER DEFIBRILLATOR IMPLANT N/A 08/05/2011   Primary prevention SJM ICD implanted,  Analyze ST study patient   INNER EAR SURGERY     left x 17   LUMBAR DISC SURGERY  02/2008   fusion   NASAL SEPTUM SURGERY     RIGHT HEART CATH N/A 12/11/2021   Procedure: RIGHT HEART CATH;  Surgeon: [name], MD;  Location: [facility];  Service: Cardiovascular;  Laterality: N/A;   THORACIC AORTIC ENDOVASCULAR STENT GRAFT N/A 03/31/2018    Procedure: THORACIC AORTIC ENDOVASCULAR STENT GRAFT;  Surgeon: Serafina Mitchell, MD;  Location: [facility];  Service: Vascular;  Laterality: N/A;   TOTAL ABDOMINAL HYSTERECTOMY     complete    Current Medications: Current Meds  Medication Sig   acetaminophen (TYLENOL) 500 MG tablet Take 1,000 mg by mouth every 6 (six) hours as needed (back pain.).   amitriptyline (ELAVIL) 50 MG tablet Take 1 tablet (50 mg total) by mouth at bedtime.   aspirin 81 MG EC tablet Take 81 mg by mouth daily in the afternoon.   bisoprolol (ZEBETA) 5 MG tablet TAKE 1 TABLET BY MOUTH DAILY.   dexlansoprazole (DEXILANT) 60 MG capsule  TAKE 1 CAPSULE (60 MG TOTAL) BY MOUTH DAILY. **PLEASE CALL OFFICE TO SCHEDULE FOLLOW UP   dicyclomine (BENTYL) 10 MG capsule Take 1 capsule (10 mg total) by mouth every 6 (six) hours as needed for spasms.   furosemide (LASIX) 40 MG tablet Take 1 tablet (40 mg total) by mouth daily.   hydrALAZINE (APRESOLINE) 10 MG tablet Take 1 tablet (10 mg total) by mouth 3 (three) times daily.   isosorbide mononitrate (IMDUR) 30 MG 24 hr tablet Take 0.5 tablets (15 mg total) by mouth daily.   polyethylene glycol powder (GLYCOLAX/MIRALAX) powder Take 17 g by mouth daily as needed for mild constipation.   PROAIR HFA 108 (90 Base) MCG/ACT inhaler 1 puff daily as needed.   rosuvastatin (CRESTOR) 20 MG tablet TAKE 1 TABLET (20 MG TOTAL) BY MOUTH AT BEDTIME. PLEASE MAKE YEARLY APPT WITH DR. Burt Knack FOR OCTOBER 2022 FOR FUTURE REFILLS.   Tiotropium Bromide-Olodaterol (STIOLTO RESPIMAT) 2.5-2.5 MCG/ACT AERS Inhale 2 puffs into the lungs daily.   topiramate (TOPAMAX) 50 MG tablet Take 1 tablet (50 mg total) by mouth daily.     Allergies:   Sulfa antibiotics, Sulfonamide derivatives, and Zestril [lisinopril]   Social History   Socioeconomic History   Marital status: Divorced    Spouse name: Not on file   Number of children: 2   Years of education: 11   Highest education level: Not on file  Occupational  History   Occupation: Unemployed    Fish farm manager: UNEMPLOYED    Employer: DISABLED  Tobacco Use   Smoking status: Former    Packs/day: 0.13    Years: 30.00    Total pack years: 3.90    Types: Cigarettes   Smokeless tobacco: Never  Vaping Use   Vaping Use: Never used  Substance and Sexual Activity   Alcohol use: No    Alcohol/week: 0.0 standard drinks of alcohol   Drug use: No   Sexual activity: Not on file  Other Topics Concern   Not on file  Social History Narrative   Pt lives in [place] alone.  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   Patient has 11 th grade education.Right handed.   Caffeine- one cup daily         Social Determinants of Health   Financial Resource Strain: Low Risk  (12/13/2021)   Overall Financial Resource Strain (CARDIA)    Difficulty of Paying Living Expenses: Not hard at all  Food Insecurity: No Food Insecurity (12/13/2021)   Hunger Vital Sign    Worried About Running Out of Food in the Last Year: Never true    Ran Out of Food in the Last Year: Never true  Transportation Needs: No Transportation Needs (12/13/2021)   PRAPARE - Hydrologist (Medical): No    Lack of Transportation (Non-Medical): No  Physical Activity: Inactive (12/13/2021)   Exercise Vital Sign    Days of Exercise per Week: 0 days    Minutes of Exercise per Session: 0 min  Stress: No Stress Concern Present (12/13/2021)   [place]    Feeling of Stress : Not at all  Social Connections: Moderately Isolated (12/13/2021)   Social Connection and Isolation Panel [NHANES]    Frequency of Communication with Friends and Family: Three times a week    Frequency of Social Gatherings with Friends and Family: Three times a week    Attends Religious Services: 1 to 4 times per year  Active Member of Clubs or Organizations: No    Attends Archivist Meetings: Never    Marital Status:  Divorced     Family History: The patient's family history includes Bladder Cancer in her brother; Breast cancer in her cousin; Cancer in her son; Colon cancer in her mother; Other in her brother; Throat cancer in her brother.  ROS:   Please see the history of present illness.    All other systems reviewed and are negative.  EKGs/Labs/Other Studies Reviewed:    The following studies were reviewed today: Right Heart Catheterization 12/11/2021: Normal right heart hemodynamics with no pulmonary hypertension   RA mean of 6 RV 28/2, RVEDP 6 PA 24/11 mean 19 Pulmonary capillary wedge pressure mean 10   PA oxygen saturation 62% SVC oxygen saturation 63% Fick cardiac output 4.6 L/min, cardiac index 2.6 L/min  Echo 05-01-2021: 1. Definity contrast does not show any evidence of LV thrombus.      . Left ventricular ejection fraction, by estimation, is 25 to 30%. The  left ventricle has severely decreased function. The left ventricle  demonstrates regional wall motion abnormalities (see scoring  diagram/findings for description). The left  ventricular internal cavity size was moderately dilated. Left ventricular  diastolic parameters are consistent with Grade I diastolic dysfunction  (impaired relaxation). There is severe akinesis of the left ventricular,  mid-apical anteroseptal wall, apical   segment and lateral wall.   2. Right ventricular systolic function is normal. The right ventricular  size is normal.   3. Right atrial size was mildly dilated.   4. The mitral valve is normal in structure. No evidence of mitral valve  regurgitation.   5. The aortic valve is normal in structure. Aortic valve regurgitation is  not visualized.   EKG:  EKG is not ordered today.    Recent Labs: 08/09/2021: NT-Pro BNP 945 04/30/2022: ALT 12; BUN 24; Creatinine, Ser 1.85; Hemoglobin 12.1; Platelets 211; Potassium 4.5; Sodium 142  Recent Lipid Panel    Component Value Date/Time   CHOL 126 12/06/2021  0852   TRIG 122 12/06/2021 0852   HDL 51 12/06/2021 0852   CHOLHDL 2.5 12/06/2021 0852   CHOLHDL 2.7 01/15/2016 1345   VLDL 45 (H) 01/15/2016 1345   LDLCALC 53 12/06/2021 0852     Risk Assessment/Calculations:          Physical Exam:    VS:  BP (!) 146/82 (BP Location: Left Arm, Patient Position: Sitting, Cuff Size: Normal)   Pulse 87   Ht '5\' 2"'$  (1.575 m)   Wt 167 lb 12.8 oz (76.1 kg)   SpO2 95%   BMI 30.69 kg/m     Wt Readings from Last 3 Encounters:  06/27/22 167 lb 12.8 oz (76.1 kg)  05/08/22 170 lb (77.1 kg)  04/23/22 172 lb (78 kg)     GEN:  Well nourished, well developed elderly woman in no acute distress HEENT: Normal NECK: No JVD; No carotid bruits LYMPHATICS: No lymphadenopathy CARDIAC: RRR, no murmurs, rubs, gallops RESPIRATORY:  Clear to auscultation without rales, wheezing or rhonchi  ABDOMEN: Soft, non-tender, non-distended MUSCULOSKELETAL:  No edema; No deformity  SKIN: Warm and dry NEUROLOGIC:  Alert and oriented x 3 PSYCHIATRIC:  Normal affect   ASSESSMENT:    1. HFrEF (heart failure with reduced ejection fraction) (HCC)   2. Stage 3b chronic kidney disease ([place])   3. Coronary artery disease involving native coronary artery of native heart with angina pectoris ([place])   4. Mixed  hyperlipidemia    PLAN:    In order of problems listed above:  Stable on bisoprolol, furosemide, hydralazine, imdur. Renal function tenuous making risk of ACE/ARB/ARNI > benefit. Continue current management. NYHA 2.  Creatinine 1.85 last check, stable, avoid nephrotoxins (NSAIDs). Treated with beta blocker, long-acting nitrate, ASA, statin. Treated with high intensity statin drug (rosuvastatin 20 mg), LDL 53 mg/dL at goal.     Follow-up 6 months APP, 1 year with me unless problems arise in the interim     Medication Adjustments/Labs and Tests Ordered: Current medicines are reviewed at length with the patient today.  Concerns regarding medicines are outlined above.   No orders of the defined types were placed in this encounter.  No orders of the defined types were placed in this encounter.   Patient Instructions  Medication Instructions:  Your physician recommends that you continue on your current medications as directed. Please refer to the Current Medication list given to you today.  *If you need a refill on your cardiac medications before your next appointment, please call your pharmacy*   Lab Work: NONE If you have labs (blood work) drawn today and your tests are completely normal, you will receive your results only by: [place] (if you have MyChart) OR A paper copy in the mail If you have any lab test that is abnormal or we need to change your treatment, we will call you to review the results.   Testing/Procedures: NONE   Follow-Up: At [facility], you and your health needs are our priority.  As part of our continuing mission to provide you with exceptional heart care, we have created designated Provider Care Teams.  These Care Teams include your primary Cardiologist (physician) and Advanced Practice Providers (APPs -  Physician Assistants and Nurse Practitioners) who all work together to provide you with the care you need, when you need it.  We recommend signing up for the patient portal called "MyChart".  Sign up information is provided on this After Visit Summary.  MyChart is used to connect with patients for Virtual Visits (Telemedicine).  Patients are able to view lab/test results, encounter notes, upcoming appointments, etc.  Non-urgent messages can be sent to your provider as well.   To learn more about what you can do with MyChart, go to [email or web address].    Your next appointment:   6 month(s)  The format for your next appointment:   In Person  Provider:   Nicholes Rough, PA-C, Melina Copa, PA-C, Ambrose Pancoast, NP, Ermalinda Barrios, PA-C, Christen Bame, NP, or Richardson Dopp, PA-C     Then, [name], MD  will plan to see you again in 1 year(s).      Important Information About Sugar         Signed, [name], MD  06/28/2022 9:18 AM    [place]

## 2022-06-27 NOTE — Patient Instructions (Signed)
Medication Instructions:  Your physician recommends that you continue on your current medications as directed. Please refer to the Current Medication list given to you today.  *If you need a refill on your cardiac medications before your next appointment, please call your pharmacy*   Lab Work: NONE If you have labs (blood work) drawn today and your tests are completely normal, you will receive your results only by: [place] (if you have MyChart) OR A paper copy in the mail If you have any lab test that is abnormal or we need to change your treatment, we will call you to review the results.   Testing/Procedures: NONE   Follow-Up: At [facility], you and your health needs are our priority.  As part of our continuing mission to provide you with exceptional heart care, we have created designated Provider Care Teams.  These Care Teams include your primary Cardiologist (physician) and Advanced Practice Providers (APPs -  Physician Assistants and Nurse Practitioners) who all work together to provide you with the care you need, when you need it.  We recommend signing up for the patient portal called "MyChart".  Sign up information is provided on this After Visit Summary.  MyChart is used to connect with patients for Virtual Visits (Telemedicine).  Patients are able to view lab/test results, encounter notes, upcoming appointments, etc.  Non-urgent messages can be sent to your provider as well.   To learn more about what you can do with MyChart, go to [email or web address].    Your next appointment:   6 month(s)  The format for your next appointment:   In Person  Provider:   Nicholes Rough, PA-C, Melina Copa, PA-C, Ambrose Pancoast, NP, Ermalinda Barrios, PA-C, Christen Bame, NP, or Richardson Dopp, PA-C     Then, [name], MD will plan to see you again in 1 year(s).      Important Information About Sugar

## 2022-07-03 ENCOUNTER — Ambulatory Visit (INDEPENDENT_AMBULATORY_CARE_PROVIDER_SITE_OTHER): Payer: Medicare Other

## 2022-07-03 DIAGNOSIS — I255 Ischemic cardiomyopathy: Secondary | ICD-10-CM | POA: Diagnosis not present

## 2022-07-04 LAB — CUP PACEART REMOTE DEVICE CHECK
Battery Remaining Longevity: 6 mo
Battery Remaining Percentage: 4 %
Battery Voltage: 2.62 V
Brady Statistic RV Percent Paced: 1 %
Date Time Interrogation Session: 20231208113631
HighPow Impedance: 86 Ohm
HighPow Impedance: 86 Ohm
Implantable Lead Connection Status: 753985
Implantable Lead Implant Date: 20130108
Implantable Lead Location: 753860
Implantable Pulse Generator Implant Date: 20130108
Lead Channel Impedance Value: 510 Ohm
Lead Channel Pacing Threshold Amplitude: 0.75 V
Lead Channel Pacing Threshold Pulse Width: 0.5 ms
Lead Channel Sensing Intrinsic Amplitude: 12 mV
Lead Channel Setting Pacing Amplitude: 2.5 V
Lead Channel Setting Pacing Pulse Width: 0.5 ms
Lead Channel Setting Sensing Sensitivity: 0.5 mV
Pulse Gen Serial Number: 1016523
Zone Setting Status: 755011

## 2022-07-07 NOTE — Telephone Encounter (Signed)
2nd attempt to reach pt regarding message below.  Left another message to call back.

## 2022-07-24 NOTE — Telephone Encounter (Signed)
3rd final attempt to reach pt, left another message to call back.

## 2022-07-25 NOTE — Progress Notes (Signed)
Remote ICD transmission.   

## 2022-08-11 ENCOUNTER — Other Ambulatory Visit (HOSPITAL_BASED_OUTPATIENT_CLINIC_OR_DEPARTMENT_OTHER): Payer: Self-pay

## 2022-08-11 ENCOUNTER — Emergency Department (HOSPITAL_BASED_OUTPATIENT_CLINIC_OR_DEPARTMENT_OTHER): Payer: 59 | Admitting: Radiology

## 2022-08-11 ENCOUNTER — Other Ambulatory Visit: Payer: Self-pay

## 2022-08-11 ENCOUNTER — Emergency Department (HOSPITAL_BASED_OUTPATIENT_CLINIC_OR_DEPARTMENT_OTHER)
Admission: EM | Admit: 2022-08-11 | Discharge: 2022-08-11 | Disposition: A | Discharge status: Home / Self Care | Payer: 59 | Attending: Emergency Medicine | Admitting: Emergency Medicine

## 2022-08-11 ENCOUNTER — Encounter (HOSPITAL_BASED_OUTPATIENT_CLINIC_OR_DEPARTMENT_OTHER): Payer: Self-pay | Admitting: Emergency Medicine

## 2022-08-11 DIAGNOSIS — Z79899 Other long term (current) drug therapy: Secondary | ICD-10-CM | POA: Diagnosis not present

## 2022-08-11 DIAGNOSIS — I10 Essential (primary) hypertension: Secondary | ICD-10-CM | POA: Insufficient documentation

## 2022-08-11 DIAGNOSIS — Z7982 Long term (current) use of aspirin: Secondary | ICD-10-CM | POA: Insufficient documentation

## 2022-08-11 DIAGNOSIS — I2510 Atherosclerotic heart disease of native coronary artery without angina pectoris: Secondary | ICD-10-CM | POA: Diagnosis not present

## 2022-08-11 DIAGNOSIS — M25551 Pain in right hip: Secondary | ICD-10-CM | POA: Insufficient documentation

## 2022-08-11 MED ORDER — LIDOCAINE 5 % EX PTCH
1.0000 | MEDICATED_PATCH | CUTANEOUS | 0 refills | Status: DC
  Filled 2022-08-11: qty 30, 30d supply, fill #0

## 2022-08-11 MED ORDER — KETOROLAC TROMETHAMINE 15 MG/ML IJ SOLN
15.0000 mg | Freq: Once | INTRAMUSCULAR | Status: AC
  Administered 2022-08-11: 15 mg via INTRAMUSCULAR
  Filled 2022-08-11: qty 1

## 2022-08-11 MED ORDER — TRAMADOL HCL 50 MG PO TABS
50.0000 mg | ORAL_TABLET | Freq: Once | ORAL | Status: AC
  Administered 2022-08-11: 50 mg via ORAL
  Filled 2022-08-11: qty 1

## 2022-08-11 MED ORDER — ACETAMINOPHEN 500 MG PO TABS
1000.0000 mg | ORAL_TABLET | Freq: Four times a day (QID) | ORAL | 0 refills | Status: AC | PRN
  Filled 2022-08-11: qty 30, 4d supply, fill #0

## 2022-08-11 NOTE — ED Triage Notes (Signed)
Pt via pov from home with right hip socket pain x 5 days. She also reports "buzzing" in her left ear as well. Pt states it is painful to move or lie down. Pt alert & oriented, nad noted.

## 2022-08-11 NOTE — Discharge Instructions (Addendum)
You came to the emergency department for your hip pain.  There are no fractures or dislocations.  You do have some arthritis.  Pain shot in the department as well as tramadol. At home you may continue with Tylenol.  I have also sent lidocaine patches.  Remember you may wear this for 12 hours but you must have 12 hours patch free.  It is located 1 on your neck as well.  Please keep your appointment with your PCP next week.  Do not hesitate to return with any worsening symptoms

## 2022-08-11 NOTE — ED Provider Notes (Addendum)
[facility] DEPT Provider Note   CSN: [id number] Arrival date & time: 08/11/22  1213     History  Chief Complaint  Patient presents with   Hip Pain   Ear Pain    [name] is a 77 y.o. female with a past medical history of hypertension, CAD and osteopenia presenting today with atraumatic right hip pain and left ear discomfort.  She reports these have been going on for multiple days.  Has an appointment with her PCP next week but reports she cannot wait.  Tylenol is not helping.   Hip Pain       Home Medications Prior to Admission medications   Medication Sig Start Date End Date Taking? Authorizing Provider  lidocaine (LIDODERM) 5 % Place 1 patch onto the skin daily. Remove & Discard patch within 12 hours or as directed by MD 08/11/22  Yes Jessy Calixte A, PA-C  acetaminophen (TYLENOL) 500 MG tablet Take 2 tablets (1,000 mg total) by mouth every 6 (six) hours as needed (back pain.). 08/11/22   Roselynn Whitacre A, PA-C  amitriptyline (ELAVIL) 50 MG tablet Take 1 tablet (50 mg total) by mouth at bedtime. 03/19/22   Horald Pollen, MD  aspirin 81 MG EC tablet Take 81 mg by mouth daily in the afternoon.    [provider]  bisoprolol (ZEBETA) 5 MG tablet TAKE 1 TABLET BY MOUTH DAILY. 06/18/22   [name], MD  dexlansoprazole (DEXILANT) 60 MG capsule TAKE 1 CAPSULE (60 MG TOTAL) BY MOUTH DAILY. **PLEASE CALL OFFICE TO SCHEDULE FOLLOW UP 06/09/22   Esterwood, Amy S, PA-C  dicyclomine (BENTYL) 10 MG capsule Take 1 capsule (10 mg total) by mouth every 6 (six) hours as needed for spasms. 11/21/21   Horald Pollen, MD  furosemide (LASIX) 40 MG tablet Take 1 tablet (40 mg total) by mouth daily. 12/09/21   Richardson Dopp T, PA-C  hydrALAZINE (APRESOLINE) 10 MG tablet Take 1 tablet (10 mg total) by mouth 3 (three) times daily. 08/13/21 08/13/22  Richardson Dopp T, PA-C  isosorbide mononitrate (IMDUR) 30 MG 24 hr tablet Take 0.5 tablets (15  mg total) by mouth daily. 08/13/21 08/13/22  Richardson Dopp T, PA-C  nitroGLYCERIN (NITROSTAT) 0.4 MG SL tablet Place 1 tablet (0.4 mg total) under the tongue every 5 (five) minutes as needed for chest pain. 03/29/19 12/25/21  Richardson Dopp T, PA-C  polyethylene glycol powder (GLYCOLAX/MIRALAX) powder Take 17 g by mouth daily as needed for mild constipation. 07/08/17   Daleen Squibb, MD  PROAIR HFA [phone] Base) MCG/ACT inhaler 1 puff daily as needed. 03/27/21   Horald Pollen, MD  rosuvastatin (CRESTOR) 20 MG tablet TAKE 1 TABLET (20 MG TOTAL) BY MOUTH AT BEDTIME. PLEASE MAKE YEARLY APPT WITH DR. Burt Knack FOR OCTOBER 2022 FOR FUTURE REFILLS. 02/18/22   Richardson Dopp T, PA-C  Tiotropium Bromide-Olodaterol (STIOLTO RESPIMAT) 2.5-2.5 MCG/ACT AERS Inhale 2 puffs into the lungs daily. 01/20/22   Hunsucker, Bonna Gains, MD  topiramate (TOPAMAX) 50 MG tablet Take 1 tablet (50 mg total) by mouth daily. 11/13/21   Suzzanne Cloud, NP      Allergies    Sulfa antibiotics, Sulfonamide derivatives, and Zestril [lisinopril]    Review of Systems   Review of Systems  Genitourinary:  Negative for decreased urine volume, dysuria, hematuria, urgency, vaginal discharge and vaginal pain.    Physical Exam Updated Vital Signs BP (!) 145/84 (BP Location: Left Arm)   Pulse (!) 102  Temp 98.3 F (36.8 C) (Oral)   Resp 18   Ht '5\' 2"'$  (1.575 m)   Wt 76.1 kg   SpO2 96%   BMI 30.69 kg/m  Physical Exam Vitals and nursing note reviewed.  Constitutional:      Appearance: Normal appearance.  HENT:     Head: Normocephalic and atraumatic.     Right Ear: Tympanic membrane, ear canal and external ear normal.     Ears:     Comments: Dried and flaking skin in the left ear canal.  Near cerumen impaction right visualize the TM and I do not see any signs of infection.  No mastoid tenderness. Eyes:     General: No scleral icterus.    Conjunctiva/sclera: Conjunctivae normal.  Pulmonary:     Effort: Pulmonary effort is  normal. No respiratory distress.  Musculoskeletal:     Comments: Full range of motion of right hip.  Hesitant movement actively secondary to pain however normal passive range of motion without any step-offs, crepitus or rigidity.  Normal DP pulse, warm and perfused foot.  Ambulatory  Skin:    Findings: No rash.  Neurological:     Mental Status: She is alert.  Psychiatric:        Mood and Affect: Mood normal.     ED Results / Procedures / Treatments   Labs (all labs ordered are listed, but only abnormal results are displayed) Labs Reviewed - No data to display  EKG None  Radiology DG Hip Unilat W or Wo Pelvis 2-3 Views Right  Result Date: 08/11/2022 CLINICAL DATA:  Hip pain.  No history of injury EXAM: DG HIP (WITH OR WITHOUT PELVIS) 3V RIGHT COMPARISON:  None Available. FINDINGS: Osteopenia. No fracture or dislocation. Preserved joint spaces. Mild degenerative changes along the sacroiliac joints. Fixation hardware noted along the lumbar spine. Scattered vascular calcifications identified in the pelvis. IMPRESSION: Osteopenia.  Slight degenerative change. Electronically Signed   By: Jill Side M.D.   On: 08/11/2022 14:00    Procedures Procedures   Medications Ordered in ED Medications  ketorolac (TORADOL) 15 MG/ML injection 15 mg (has no administration in time range)  traMADol (ULTRAM) tablet 50 mg (has no administration in time range)    ED Course/ Medical Decision Making/ A&P                           Medical Decision Making Amount and/or Complexity of Data Reviewed Radiology: ordered.  Risk OTC drugs. Prescription drug management.   77 year old female presenting today with atraumatic hip pain and left ear discomfort.  Differential includes but is not limited to osteoarthritis, rheumatoid arthritis, septic arthritis, fractures, dislocation and bursitis.  Imaging: X-ray ordered in triage.  Viewed and interpreted by me, I agree that there is only some signs of  arthritis.  Treatment: Tramadol and Toradol shot   MDM/disposition: 77 year old female presenting today with right groin/hip pain.  X-ray was negative.  No signs of septic arthritis.  No deformities.  Low suspicion AVN, no risk factor of this.  At this time I suspect patient's pain to be secondary to arthritis.  Initially an obturator nerve entrapment however this would be odd without any history of trauma or previous injury.  Regardless, she is neurovascularly intact and ambulatory.  No emergent condition requiring further intervention today.  Will be discharged with PCP follow-up next week as she already has planned.  Will continue with Tylenol as well as lidocaine patches.  Final  Clinical Impression(s) / ED Diagnoses Final diagnoses:  Right hip pain    Rx / DC Orders ED Discharge Orders          Ordered    acetaminophen (TYLENOL) 500 MG tablet  Every 6 hours PRN        08/11/22 1416    lidocaine (LIDODERM) 5 %  Every 24 hours        08/11/22 1416           Results and diagnoses were explained to the patient. Return precautions discussed in full. Patient had no additional questions and expressed complete understanding.   This chart was dictated using voice recognition software.  Despite best efforts to proofread,  errors can occur which can change the documentation meaning.    Rhae Hammock, PA-C 08/11/22 1423    Rhae Hammock, PA-C 08/11/22 [place], DO 08/11/22 1428

## 2022-08-14 ENCOUNTER — Ambulatory Visit: Payer: Medicare Other | Admitting: Family Medicine

## 2022-08-18 ENCOUNTER — Telehealth: Payer: Self-pay

## 2022-08-18 NOTE — Telephone Encounter (Signed)
Device has reached less than 16mhs.   Patient notified and set up on monthly device checks to monitor in [employer] and with [employer].

## 2022-08-20 ENCOUNTER — Encounter: Payer: Self-pay | Admitting: Emergency Medicine

## 2022-08-20 ENCOUNTER — Ambulatory Visit (INDEPENDENT_AMBULATORY_CARE_PROVIDER_SITE_OTHER): Payer: 59 | Admitting: Emergency Medicine

## 2022-08-20 ENCOUNTER — Other Ambulatory Visit: Payer: Self-pay | Admitting: Emergency Medicine

## 2022-08-20 VITALS — BP 142/80 | HR 80 | Temp 98.1°F | Ht 62.0 in | Wt 168.2 lb

## 2022-08-20 DIAGNOSIS — R1031 Right lower quadrant pain: Secondary | ICD-10-CM | POA: Diagnosis not present

## 2022-08-20 DIAGNOSIS — M25551 Pain in right hip: Secondary | ICD-10-CM | POA: Diagnosis not present

## 2022-08-20 DIAGNOSIS — G43009 Migraine without aura, not intractable, without status migrainosus: Secondary | ICD-10-CM

## 2022-08-20 MED ORDER — TRAMADOL HCL 50 MG PO TABS: 50.0000 mg | ORAL_TABLET | Freq: Three times a day (TID) | ORAL | 0 refills | Status: AC | PRN

## 2022-08-20 MED ORDER — MELOXICAM 15 MG PO TABS: 15.0000 mg | ORAL_TABLET | Freq: Every day | ORAL | 0 refills | Status: DC

## 2022-08-20 NOTE — Assessment & Plan Note (Signed)
Most likely related to right hip pain.  Musculoskeletal in nature Osteoarthritis playing a role. Will benefit from meloxicam 15 mg daily and tramadol 50 mg as needed for pain. Needs orthopedic evaluation.  Referral placed today.

## 2022-08-20 NOTE — Patient Instructions (Signed)
Hip Pain The hip is the joint between the upper legs and the lower pelvis. The bones, cartilage, tendons, and muscles of your hip joint support your body and allow you to move around. Hip pain can range from a minor ache to severe pain in one or both of your hips. The pain may be felt on the inside of the hip joint near the groin, or on the outside near the buttocks and upper thigh. You may also have swelling or stiffness in your hip area. Follow these instructions at home: Managing pain, stiffness, and swelling     If directed, put ice on the painful area. To do this: Put ice in a plastic bag. Place a towel between your skin and the bag. Leave the ice on for 20 minutes, 2-3 times a day. If directed, apply heat to the affected area as often as told by your health care provider. Use the heat source that your health care provider recommends, such as a moist heat pack or a heating pad. Place a towel between your skin and the heat source. Leave the heat on for 20-30 minutes. Remove the heat if your skin turns bright red. This is especially important if you are unable to feel pain, heat, or cold. You may have a greater risk of getting burned. Activity Do exercises as told by your health care provider. Avoid activities that cause pain. General instructions  Take over-the-counter and prescription medicines only as told by your health care provider. Keep a journal of your symptoms. Write down: How often you have hip pain. The location of your pain. What the pain feels like. What makes the pain worse. Sleep with a pillow between your legs on your most comfortable side. Keep all follow-up visits as told by your health care provider. This is important. Contact a health care provider if: You cannot put weight on your leg. Your pain or swelling continues or gets worse after one week. It gets harder to walk. You have a fever. Get help right away if: You fall. You have a sudden increase in pain  and swelling in your hip. Your hip is red or swollen or very tender to touch. Summary Hip pain can range from a minor ache to severe pain in one or both of your hips. The pain may be felt on the inside of the hip joint near the groin, or on the outside near the buttocks and upper thigh. Avoid activities that cause pain. Write down how often you have hip pain, the location of the pain, what makes it worse, and what it feels like. This information is not intended to replace advice given to you by your health care provider. Make sure you discuss any questions you have with your health care provider. Document Revised: 11/29/2018 Document Reviewed: 11/29/2018 Elsevier Patient Education  2023 [employer]  

## 2022-08-20 NOTE — Assessment & Plan Note (Addendum)
Mechanical in nature.  Osteoarthritic hip. Will benefit from NSAIDs, meloxicam 15 mg daily Pain management discussed.  Tylenol for mild to moderate pain and tramadol for moderate to severe pain as needed Needs Orthopedic evaluation.  Referral placed today.

## 2022-08-20 NOTE — Progress Notes (Signed)
[name] 77 y.o.   Chief Complaint  Patient presents with   Follow-up    F/u appt, patient having some left leg pain, arthritis,  neck pain left side     HISTORY OF PRESENT ILLNESS: This is a 77 y.o. female complaining of persistent pain to right hip area.  Was seen in emergency department on 08/11/2022.  X-rays of right hip showed degenerative arthritic changes.  Was given dose of tramadol and Toradol injection.  Still hurting. No other associated symptoms.  Also had left-sided neck pain No other complaints or medical concerns today.  HPI   Prior to Admission medications   Medication Sig Start Date End Date Taking? Authorizing Provider  acetaminophen (TYLENOL) 500 MG tablet Take 2 tablets (1,000 mg total) by mouth every 6 (six) hours as needed (back pain.). 08/11/22  Yes Redwine, Madison A, PA-C  amitriptyline (ELAVIL) 50 MG tablet Take 1 tablet (50 mg total) by mouth at bedtime. 03/19/22  Yes SagardiaInes Bloomer, MD  aspirin 81 MG EC tablet Take 81 mg by mouth daily in the afternoon.   Yes [provider]  bisoprolol (ZEBETA) 5 MG tablet TAKE 1 TABLET BY MOUTH DAILY. 06/18/22  Yes [name], MD  dexlansoprazole (DEXILANT) 60 MG capsule TAKE 1 CAPSULE (60 MG TOTAL) BY MOUTH DAILY. **PLEASE CALL OFFICE TO SCHEDULE FOLLOW UP 06/09/22  Yes Esterwood, Amy S, PA-C  dicyclomine (BENTYL) 10 MG capsule Take 1 capsule (10 mg total) by mouth every 6 (six) hours as needed for spasms. 11/21/21  Yes Tima Curet, Ines Bloomer, MD  estradiol (ESTRACE) 0.1 MG/GM vaginal cream Place vaginally. 06/16/22  Yes [provider]  furosemide (LASIX) 40 MG tablet Take 1 tablet (40 mg total) by mouth daily. 12/09/21  Yes Weaver, Scott T, PA-C  lidocaine (LIDODERM) 5 % Place 1 patch onto the skin daily. Remove & Discard patch within 12 hours or as directed by MD 08/11/22  Yes Redwine, Madison A, PA-C  polyethylene glycol powder (GLYCOLAX/MIRALAX) powder Take 17 g by mouth daily as needed  for mild constipation. 07/08/17  Yes Jacelyn Pi, Lilia Argue, MD  [facility] HFA [phone] Base) MCG/ACT inhaler 1 puff daily as needed. 03/27/21  Yes Lawsen Arnott, Ines Bloomer, MD  rosuvastatin (CRESTOR) 20 MG tablet TAKE 1 TABLET (20 MG TOTAL) BY MOUTH AT BEDTIME. PLEASE MAKE YEARLY APPT WITH DR. Burt Knack FOR OCTOBER 2022 FOR FUTURE REFILLS. 02/18/22  Yes Weaver, Scott T, PA-C  Tiotropium Bromide-Olodaterol (STIOLTO RESPIMAT) 2.5-2.5 MCG/ACT AERS Inhale 2 puffs into the lungs daily. 01/20/22  Yes Hunsucker, Bonna Gains, MD  topiramate (TOPAMAX) 50 MG tablet Take 1 tablet (50 mg total) by mouth daily. 11/13/21  Yes Suzzanne Cloud, NP  hydrALAZINE (APRESOLINE) 10 MG tablet Take 1 tablet (10 mg total) by mouth 3 (three) times daily. 08/13/21 08/13/22  Richardson Dopp T, PA-C  isosorbide mononitrate (IMDUR) 30 MG 24 hr tablet Take 0.5 tablets (15 mg total) by mouth daily. 08/13/21 08/13/22  Richardson Dopp T, PA-C  nitroGLYCERIN (NITROSTAT) 0.4 MG SL tablet Place 1 tablet (0.4 mg total) under the tongue every 5 (five) minutes as needed for chest pain. 03/29/19 12/25/21  Richardson Dopp T, PA-C    Allergies  Allergen Reactions   Sulfa Antibiotics Other (See Comments)    Unknown, childhood allergy    Sulfonamide Derivatives Other (See Comments)    UNSURE   Zestril [Lisinopril] Cough    Patient Active Problem List   Diagnosis Date Noted   Recent urinary tract infection  05/08/2022   Chronic bilateral low back pain without sciatica 05/08/2022   Spinal stenosis of lumbar region without neurogenic claudication 05/08/2022   HFrEF (heart failure with reduced ejection fraction) ([place]) 12/02/2021   Stage 3b chronic kidney disease ([place]) 08/08/2021   Chronic insomnia 11/14/2019   Colon polyp 06/16/2018   Thoracic aortic aneurysm without rupture ([place]) 03/31/2018   Chronic renal insufficiency    AICD (automatic cardioverter/defibrillator) present    Ischemic cardiomyopathy    COPD (chronic obstructive pulmonary disease) ([place])     Hyperlipemia    Insomnia    Pre-diabetes 06/19/2017   Gastric polyp    Osteoporosis 05/11/2013   Hearing loss 04/29/2013   Mild cognitive impairment 04/29/2013   Pseudoaneurysm of aortic arch ([place]) 05/05/2012   Implantable cardioverter-defibrillator (ICD) in situ 08/06/2011   DDD (degenerative disc disease), lumbar 05/16/2011   History of pulmonary embolus (PE) 01/02/2011   UNSPECIFIED ANEMIA 07/03/2010   Essential hypertension 09/05/2009   DIVERTICULOSIS-COLON 08/14/2009   PERSONAL HX COLONIC POLYPS 04/24/2009   TOBACCO ABUSE 04/04/2009   Coronary artery disease involving native coronary artery of native heart with angina pectoris ([place]) 02/25/2009   GERD 10/19/2007   IBS 10/19/2007   COLONIC POLYPS, ADENOMATOUS 05/12/2007    Past Medical History:  Diagnosis Date   Abdominal pain, left lower quadrant 08/14/2009   Qualifier: Diagnosis of  By: Laney Potash, Pam     Abnormal CT scan, stomach    Abnormal LFTs 06/19/2017   AICD (automatic cardioverter/defibrillator) present    Dr.Allred follows   Aortic arch pseudoaneurysm ([place])    a. followed by Dr. Prescott Gum.   Arthritis    Asthma    Benign neoplasm of colon    CAD (coronary artery disease) 02/2009   a. anterior STEMI rx with BMS to prox LAD in 02/2009. b. ISR s/p PTCA 06/2009. c. ISR s/p thrombectomy & PTCA 03/2010 due to late stent thrombosis. // Myoview 04/2019: EF 28, ant, ant-sept, inf-sept scar, no ischemia, high risk (stable>>cont med Rx)    Cardiomyopathy, ischemic 06/12/2011   Chronic renal insufficiency    stage 3   Chronic systolic CHF (congestive heart failure) ([place])    a. s/p ST. Jude ICD 10/25/2011.   Chronic systolic heart failure ([place]) 08/05/2011   Colon polyp    Complication of anesthesia    COPD (chronic obstructive pulmonary disease) ([place])    CORONARY ARTERY DISEASE 04/24/2009   Qualifier: Diagnosis of  By: Shane Crutch, Amy S    DDD (degenerative disc disease), lumbar 05/16/2011   Depression    "since my son  died in 25-Nov-2022" October 24, 2017)   Diverticulosis    DIVERTICULOSIS-COLON 08/14/2009   Qualifier: Diagnosis of  By: Trellis Paganini PA-c, Amy S    DYSPHAGIA UNSPECIFIED 04/24/2009   Qualifier: Diagnosis of  By: Laney Potash, Pam     Dyspnea    "when I lay down at night"    Essential hypertension 09/05/2009   Qualifier: Diagnosis of  By: Harvest Dark CMA, Jennifer     Gastric polyp    GERD 10/19/2007   Qualifier: Diagnosis of  By: Rennie Plowman RN, Blanca Friend: Diagnosis of  By: Laney Potash, Pam     GERD (gastroesophageal reflux disease)    GI bleed    a. h/o GIB on DAPT, now on ASA only.   Headache 01/03/2013   Hearing loss    left ear   History of pulmonary embolus (PE) 01/02/2011   Hyperlipemia    Hyperlipidemia, unspecified 04/24/2009  Qualifier: Diagnosis of  By: Shane Crutch, Amy S    Hypertension    ICD-St.Jude 08/06/2011   S/p St. Jude ICD placement 08/05/11    Insomnia    Irritable bowel syndrome    Ischemic cardiomyopathy    EF 10-15%   Memory loss    Migraine headache without aura    Myocardial infarct ([place]) 2011 x 2   Dr. 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[place]) 05/05/2012   Pulmonary embolism ([place]) 2011   Stroke [facility])    'When I was young." no residual   Thoracic aortic aneurysm ([place]) 03/31/2018   TOBACCO ABUSE 04/04/2009   Qualifier: Diagnosis of  By: Arvid Right  Qualifier: Diagnosis of  By: [name], MD, Jaquelyn Bitter    Transfusion history    ?'12 or '13   UNSPECIFIED ANEMIA 07/03/2010   Qualifier: Diagnosis of  By: Shelda Pal     Unspecified mastoiditis     Past Surgical History:  Procedure Laterality Date   ANGIOPLASTY  07/02/09, 04/01/10   BILATERAL SALPINGOOPHORECTOMY     CAD( bare metal stent)   02/2009   x 1   CAROTID-SUBCLAVIAN BYPASS GRAFT Left 03/31/2018   Procedure: LEFT SUBCLAVIAN ARTERY BYPASS GRAFT;  Surgeon: Serafina Mitchell, MD;  Location: [facility] OR;  Service: Vascular;  Laterality: Left;   CERVICAL SPINE SURGERY  08/08   COLONOSCOPY WITH PROPOFOL N/A 04/29/2016   Procedure: COLONOSCOPY WITH PROPOFOL;  Surgeon: [name], MD;  Location: [facility] ENDOSCOPY;  Service: Gastroenterology;  Laterality: N/A;   ESOPHAGOGASTRODUODENOSCOPY N/A 12/09/2016   Procedure: ESOPHAGOGASTRODUODENOSCOPY (EGD);  Surgeon: [name], MD;  Location: [facility];  Service: Gastroenterology;  Laterality: N/A;   ESOPHAGOGASTRODUODENOSCOPY (EGD) WITH PROPOFOL N/A 04/29/2016   Procedure: ESOPHAGOGASTRODUODENOSCOPY (EGD) WITH PROPOFOL;  Surgeon: [name], MD;  Location: [facility];  Service: Gastroenterology;  Laterality: N/A;   EUS N/A 05/15/2016   Procedure: UPPER ENDOSCOPIC ULTRASOUND (EUS) RADIAL;  Surgeon: Milus Banister, MD;  Location: [facility] ENDOS[facility];  Service: Endoscopy;  Laterality: N/A;   IMPLANTABLE CARDIOVERTER DEFIBRILLATOR IMPLANT N/A 08/05/2011   Primary prevention SJM ICD implanted,  Analyze ST study patient   INNER EAR SURGERY     left x 17   LUMBAR DISC SURGERY  02/2008   fusion   NASAL SEPTUM SURGERY     RIGHT HEART CATH N/A 12/11/2021   Procedure: RIGHT HEART CATH;  Surgeon: [name], MD;  Location: [facility];  Service: Cardiovascular;  Laterality: N/A;   THORACIC AORTIC ENDOVASCULAR STENT GRAFT N/A 03/31/2018   Procedure: THORACIC AORTIC ENDOVASCULAR STENT GRAFT;  Surgeon: Serafina Mitchell, MD;  Location: [facility] OR;  Service: Vascular;  Laterality: N/A;   TOTAL ABDOMINAL HYSTERECTOMY     complete    Social History   Socioeconomic History   Marital status: Divorced    Spouse name: Not on file   Number of children: 2   Years of education: 11   Highest education level: Not on file  Occupational History   Occupation: Unemployed     Employer: UNEMPLOYED    Employer: DISABLED  Tobacco Use   Smoking status: Former    Packs/day: 0.13    Years: 30.00  Total pack years: 3.90    Types: Cigarettes   Smokeless tobacco: Never  Vaping Use   Vaping Use: Never used  Substance and Sexual Activity   Alcohol use: No    Alcohol/week: 0.0 standard drinks of alcohol   Drug use: No   Sexual activity: Not on file  Other Topics Concern   Not on file  Social History Narrative   Pt lives in [place] alone.  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   Patient has 11 th grade education.Right handed.   Caffeine- one cup daily         Social Determinants of Health   Financial Resource Strain: Low Risk  (12/13/2021)   Overall Financial Resource Strain (CARDIA)    Difficulty of Paying Living Expenses: Not hard at all  Food Insecurity: No Food Insecurity (12/13/2021)   Hunger Vital Sign    Worried About Running Out of Food in the Last Year: Never true    Ran Out of Food in the Last Year: Never true  Transportation Needs: No Transportation Needs (12/13/2021)   PRAPARE - Hydrologist (Medical): No    Lack of Transportation (Non-Medical): No  Physical Activity: Inactive (12/13/2021)   Exercise Vital Sign    Days of Exercise per Week: 0 days    Minutes of Exercise per Session: 0 min  Stress: No Stress Concern Present (12/13/2021)   [place]    Feeling of Stress : Not at all  Social Connections: Moderately Isolated (12/13/2021)   Social Connection and Isolation Panel [NHANES]    Frequency of Communication with Friends and Family: Three times a week    Frequency of Social Gatherings with Friends and Family: Three times a week    Attends Religious Services: 1 to 4 times per year    Active Member of Clubs or Organizations: No    Attends Archivist Meetings: Never    Marital Status: Divorced  Human resources officer Violence: Not  At Risk (12/13/2021)   Humiliation, Afraid, Rape, and Kick questionnaire    Fear of Current or Ex-Partner: No    Emotionally Abused: No    Physically Abused: No    Sexually Abused: No    Family History  Problem Relation Age of Onset   Colon cancer Mother    Other Brother        tumor in his brain   Bladder Cancer Brother    Throat cancer Brother    Cancer Son        kidney, spread to his liver   Breast cancer Cousin      Review of Systems  Constitutional: Negative.  Negative for chills and fever.  HENT: Negative.  Negative for congestion and sore throat.   Respiratory: Negative.  Negative for cough and shortness of breath.   Cardiovascular: Negative.  Negative for chest pain and palpitations.  Gastrointestinal:  Negative for abdominal pain, nausea and vomiting.  Genitourinary: Negative.  Negative for dysuria and hematuria.  Skin: Negative.  Negative for rash.  Neurological: Negative.  Negative for dizziness and headaches.  All other systems reviewed and are negative.  Today's Vitals   08/20/22 1414  BP: (!) 142/80  Pulse: 80  Temp: 98.1 F (36.7 C)  TempSrc: Oral  SpO2: 95%  Weight: 168 lb 4 oz (76.3 kg)  Height: '5\' 2"'$  (1.575 m)   Body mass index is 30.77 kg/m.   Physical Exam Vitals reviewed.  Constitutional:      Appearance: Normal appearance.  HENT:     Head: Normocephalic.  Eyes:     Extraocular Movements: Extraocular movements intact.     Pupils: Pupils are equal, round, and reactive to light.  Cardiovascular:     Rate and Rhythm: Normal rate and regular rhythm.     Pulses: Normal pulses.     Heart sounds: Normal heart sounds.  Pulmonary:     Effort: Pulmonary effort is normal.     Breath sounds: Normal breath sounds.  Abdominal:     Palpations: Abdomen is soft.     Tenderness: There is no abdominal tenderness.  Musculoskeletal:     Cervical back: No tenderness.     Comments: Right hip/inguinal area: Tender range of motion  Lymphadenopathy:      Cervical: No cervical adenopathy.  Skin:    General: Skin is warm and dry.  Neurological:     General: No focal deficit present.     Mental Status: She is alert and oriented to person, place, and time.  Psychiatric:        Mood and Affect: Mood normal.        Behavior: Behavior normal.      ASSESSMENT & PLAN: A total of 42 minutes was spent with the patient and counseling/coordination of care regarding preparing for this visit, review of most recent office visit notes, review of most recent emergency department visit notes, review of multiple chronic medical conditions and their management, review of all medications, differential diagnosis of right hip/inguinal area pain, review of most recent imaging reports, pain management, prognosis, ED precautions, documentation, need for orthopedic follow-up evaluation.  Problem List Items Addressed This Visit       Other   Right inguinal pain - Primary    Most likely related to right hip pain.  Musculoskeletal in nature Osteoarthritis playing a role. Will benefit from meloxicam 15 mg daily and tramadol 50 mg as needed for pain. Needs orthopedic evaluation.  Referral placed today.      Relevant Medications   meloxicam (MOBIC) 15 MG tablet   traMADol (ULTRAM) 50 MG tablet   Right hip pain    Mechanical in nature.  Osteoarthritic hip. Will benefit from NSAIDs, meloxicam 15 mg daily Pain management discussed.  Tylenol for mild to moderate pain and tramadol for moderate to severe pain as needed Needs Orthopedic evaluation.  Referral placed today.      Relevant Medications   meloxicam (MOBIC) 15 MG tablet   traMADol (ULTRAM) 50 MG tablet   Other Relevant Orders   Ambulatory referral to Orthopedic Surgery   Patient Instructions  Hip Pain The hip is the joint between the upper legs and the lower pelvis. The bones, cartilage, tendons, and muscles of your hip joint support your body and allow you to move around. Hip pain can range from a  minor ache to severe pain in one or both of your hips. The pain may be felt on the inside of the hip joint near the groin, or on the outside near the buttocks and upper thigh. You may also have swelling or stiffness in your hip area. Follow these instructions at home: Managing pain, stiffness, and swelling     If directed, put ice on the painful area. To do this: Put ice in a plastic bag. Place a towel between your skin and the bag. Leave the ice on for 20 minutes, 2-3 times a day. If directed, apply heat to the affected  area as often as told by your health care provider. Use the heat source that your health care provider recommends, such as a moist heat pack or a heating pad. Place a towel between your skin and the heat source. Leave the heat on for 20-30 minutes. Remove the heat if your skin turns bright red. This is especially important if you are unable to feel pain, heat, or cold. You may have a greater risk of getting burned. Activity Do exercises as told by your health care provider. Avoid activities that cause pain. General instructions  Take over-the-counter and prescription medicines only as told by your health care provider. Keep a journal of your symptoms. Write down: How often you have hip pain. The location of your pain. What the pain feels like. What makes the pain worse. Sleep with a pillow between your legs on your most comfortable side. Keep all follow-up visits as told by your health care provider. This is important. Contact a health care provider if: You cannot put weight on your leg. Your pain or swelling continues or gets worse after one week. It gets harder to walk. You have a fever. Get help right away if: You fall. You have a sudden increase in pain and swelling in your hip. Your hip is red or swollen or very tender to touch. Summary Hip pain can range from a minor ache to severe pain in one or both of your hips. The pain may be felt on the inside of the  hip joint near the groin, or on the outside near the buttocks and upper thigh. Avoid activities that cause pain. Write down how often you have hip pain, the location of the pain, what makes it worse, and what it feels like. This information is not intended to replace advice given to you by your health care provider. Make sure you discuss any questions you have with your health care provider. Document Revised: 11/29/2018 Document Reviewed: 11/29/2018 Elsevier Patient Education  [place], MD [facility]

## 2022-09-01 ENCOUNTER — Ambulatory Visit: Payer: 59 | Admitting: Physician Assistant

## 2022-09-03 ENCOUNTER — Ambulatory Visit (INDEPENDENT_AMBULATORY_CARE_PROVIDER_SITE_OTHER): Payer: 59 | Admitting: Orthopedic Surgery

## 2022-09-03 ENCOUNTER — Other Ambulatory Visit: Payer: Self-pay

## 2022-09-03 DIAGNOSIS — M25551 Pain in right hip: Secondary | ICD-10-CM | POA: Diagnosis not present

## 2022-09-05 ENCOUNTER — Ambulatory Visit
Admission: RE | Admit: 2022-09-05 | Discharge: 2022-09-05 | Disposition: A | Discharge status: Home / Self Care | Payer: 59 | Source: Ambulatory Visit | Attending: Orthopedic Surgery | Admitting: Orthopedic Surgery

## 2022-09-05 ENCOUNTER — Encounter: Payer: Self-pay | Admitting: Orthopedic Surgery

## 2022-09-05 DIAGNOSIS — M25551 Pain in right hip: Secondary | ICD-10-CM

## 2022-09-05 NOTE — Progress Notes (Signed)
Office Visit Note   Patient: [name]           Date of Birth: Apr 04, 1946           MRN: [id number] Visit Date: 09/03/2022 Requested by: Horald Pollen, [place],  [place] [address] PCP: Horald Pollen, MD  Subjective: Chief Complaint  Patient presents with   Right Hip - Pain    HPI: [name] is a 77 y.o. female who presents to the office reporting 2-week history of right hip pain.  Denies any history of injury.  States that this pain wakes her from sleep at night.  She also reports groin pain which is relatively constant and radiates into the thigh.  Increased pain with lifting her leg and bending.  Does not use a cane.  Denies any loss of balance.  Unable to stand for long periods of time.  Hard for her to bend over.  She has a history of back fusion with Dr. Saintclair Halsted.  Does not usually stay on pain medication.  Denies numbness and tingling.  Hurts her to walk around as opposed to sit.  She does have a pacemaker..                ROS: All systems reviewed are negative as they relate to the chief complaint within the history of present illness.  Patient denies fevers or chills.  Assessment & Plan: Visit Diagnoses: No diagnosis found.  Plan: Impression is right hip pain in the setting of normal radiographs and history of back fusion.  Hard to say if this is some type of occult stress fracture or whether or not this is referred pain from the back.  Would start with advanced imaging of the pelvis to evaluate for any occult injury to the pelvis and hip.  If that is negative may consider CT arthrogram of the back.  Follow-Up Instructions: No follow-ups on file.   Orders:  No orders of the defined types were placed in this encounter.  No orders of the defined types were placed in this encounter.     Procedures: No procedures performed   Clinical Data: No additional findings.  Objective: Vital Signs: There were no vitals taken for this  visit.  Physical Exam:  Constitutional: Patient appears well-developed HEENT:  Head: Normocephalic Eyes:EOM are normal Neck: Normal range of motion Cardiovascular: Normal rate Pulmonary/chest: Effort normal Neurologic: Patient is alert Skin: Skin is warm Psychiatric: Patient has normal mood and affect  Ortho Exam: Ortho exam demonstrates full active and passive range of motion of the right and left hip.  Mild groin pain on the right with internal/external rotation compared to the left.  She has good hip flexion strength bilaterally.  No masses lymphadenopathy or skin changes noted in the hip region.  No definite trochanteric tenderness.  Equivocal nerve root tension signs on the right negative on the left.  She has good ankle dorsiflexion plantarflexion quad and hamstring strength.  Specialty Comments:  No specialty comments available.  Imaging: No results found.   PMFS History: Patient Active Problem List   Diagnosis Date Noted   Right inguinal pain 08/20/2022   Right hip pain 08/20/2022   Recent urinary tract infection 05/08/2022   Chronic bilateral low back pain without sciatica 05/08/2022   Spinal stenosis of lumbar region without neurogenic claudication 05/08/2022   HFrEF (heart failure with reduced ejection fraction) ([place]) 12/02/2021   Stage 3b chronic kidney disease ([place]) 08/08/2021  Chronic insomnia 11/14/2019   Colon polyp 06/16/2018   Thoracic aortic aneurysm without rupture ([place]) 03/31/2018   Chronic renal insufficiency    AICD (automatic cardioverter/defibrillator) present    Ischemic cardiomyopathy    COPD (chronic obstructive pulmonary disease) (HCC)    Hyperlipemia    Insomnia    Pre-diabetes 06/19/2017   Gastric polyp    Osteoporosis 05/11/2013   Hearing loss 04/29/2013   Mild cognitive impairment 04/29/2013   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   Implantable cardioverter-defibrillator (ICD) in situ 08/06/2011   DDD (degenerative disc  disease), lumbar 05/16/2011   History of pulmonary embolus (PE) 01/02/2011   UNSPECIFIED ANEMIA 07/03/2010   Essential hypertension 09/05/2009   DIVERTICULOSIS-COLON 08/14/2009   PERSONAL HX COLONIC POLYPS 04/24/2009   TOBACCO ABUSE 04/04/2009   Coronary artery disease involving native coronary artery of native heart with angina pectoris ([place]) 02/25/2009   GERD 10/19/2007   IBS 10/19/2007   COLONIC POLYPS, ADENOMATOUS 05/12/2007   Past Medical History:  Diagnosis Date   Abdominal pain, left lower quadrant 08/14/2009   Qualifier: Diagnosis of  By: Laney Potash, Pam     Abnormal CT scan, stomach    Abnormal LFTs 06/19/2017   AICD (automatic cardioverter/defibrillator) present    Dr.Allred follows   Aortic arch pseudoaneurysm ([place])    a. followed by Dr. Prescott Gum.   Arthritis    Asthma    Benign neoplasm of colon    CAD (coronary artery disease) 02/2009   a. anterior STEMI rx with BMS to prox LAD in 02/2009. b. ISR s/p PTCA 06/2009. c. ISR s/p thrombectomy & PTCA 03/2010 due to late stent thrombosis. // Myoview 04/2019: EF 28, ant, ant-sept, inf-sept scar, no ischemia, high risk (stable>>cont med Rx)    Cardiomyopathy, ischemic 06/12/2011   Chronic renal insufficiency    stage 3   Chronic systolic CHF (congestive heart failure) ([place])    a. s/p ST. Jude ICD 2011/10/13.   Chronic systolic heart failure ([place]) 08/05/2011   Colon polyp    Complication of anesthesia    COPD (chronic obstructive pulmonary disease) ([place])    CORONARY ARTERY DISEASE 04/24/2009   Qualifier: Diagnosis of  By: Shane Crutch, Amy S    DDD (degenerative disc disease), lumbar 05/16/2011   Depression    "since my son died in 13-Nov-2022" 10-12-17)   Diverticulosis    DIVERTICULOSIS-COLON 08/14/2009   Qualifier: Diagnosis of  By: Trellis Paganini PA-c, Amy S    DYSPHAGIA UNSPECIFIED 04/24/2009   Qualifier: Diagnosis of  By: Laney Potash, Pam     Dyspnea    "when I lay down at night"    Essential hypertension 09/05/2009   Qualifier:  Diagnosis of  By: Harvest Dark CMA, Jennifer     Gastric polyp    GERD 10/19/2007   Qualifier: Diagnosis of  By: Rennie Plowman RN, Blanca Friend: Diagnosis of  By: Laney Potash, Pam     GERD (gastroesophageal reflux disease)    GI bleed    a. h/o GIB on DAPT, now on ASA only.   Headache 01/03/2013   Hearing loss    left ear   History of pulmonary embolus (PE) 01/02/2011   Hyperlipemia    Hyperlipidemia, unspecified 04/24/2009   Qualifier: Diagnosis of  By: Trellis Paganini PA-c, Amy S    Hypertension    ICD-St.Jude 08/06/2011   S/p St. Jude ICD placement 08/05/11    Insomnia    Irritable bowel syndrome    Ischemic cardiomyopathy    EF  10-15%   Memory loss    Migraine headache without aura    Myocardial infarct ([place]) 2011 x 2   Dr. 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[place]) 05/05/2012   Pulmonary embolism ([place]) 2011   Stroke [facility])    'When I was young." no residual   Thoracic aortic aneurysm ([place]) 03/31/2018   TOBACCO ABUSE 04/04/2009   Qualifier: Diagnosis of  By: Arvid Right  Qualifier: Diagnosis of  By: [name], MD, Jaquelyn Bitter    Transfusion history    ?'12 or '13   UNSPECIFIED ANEMIA 07/03/2010   Qualifier: Diagnosis of  By: Shelda Pal     Unspecified mastoiditis     Family History  Problem Relation Age of Onset   Colon cancer Mother    Other Brother        tumor in his brain   Bladder Cancer Brother    Throat cancer Brother    Cancer Son        kidney, spread to his liver   Breast cancer Cousin     Past Surgical History:  Procedure Laterality Date   ANGIOPLASTY  07/02/09, 04/01/10   BILATERAL SALPINGOOPHORECTOMY     CAD( bare metal stent)  02/2009   x 1   CAROTID-SUBCLAVIAN BYPASS GRAFT Left 03/31/2018    Procedure: LEFT SUBCLAVIAN ARTERY BYPASS GRAFT;  Surgeon: Serafina Mitchell, MD;  [facility];  Service: Vascular;  Laterality: Left;   CERVICAL SPINE SURGERY  08/08   COLONOSCOPY WITH PROPOFOL N/A 04/29/2016   Procedure: COLONOSCOPY WITH PROPOFOL;  Surgeon: [name], MD;  [facility];  Service: Gastroenterology;  Laterality: N/A;   ESOPHAGOGASTRODUODENOSCOPY N/A 12/09/2016   Procedure: ESOPHAGOGASTRODUODENOSCOPY (EGD);  Surgeon: [name], MD;  [facility];  Service: Gastroenterology;  Laterality: N/A;   ESOPHAGOGASTRODUODENOSCOPY (EGD) WITH PROPOFOL N/A 04/29/2016   Procedure: ESOPHAGOGASTRODUODENOSCOPY (EGD) WITH PROPOFOL;  Surgeon: [name], MD;  [facility];  Service: Gastroenterology;  Laterality: N/A;   EUS N/A 05/15/2016   Procedure: UPPER ENDOSCOPIC ULTRASOUND (EUS) RADIAL;  Surgeon: Milus Banister, MD;  [facility];  Service: Endoscopy;  Laterality: N/A;   IMPLANTABLE CARDIOVERTER DEFIBRILLATOR IMPLANT N/A 08/05/2011   Primary prevention SJM ICD implanted,  Analyze ST study patient   INNER EAR SURGERY     left x 17   LUMBAR DISC SURGERY  02/2008   fusion   NASAL SEPTUM SURGERY     RIGHT HEART CATH N/A 12/11/2021   Procedure: RIGHT HEART CATH;  Surgeon: [name], MD;  Location: [facility];  Service: Cardiovascular;  Laterality: N/A;   THORACIC AORTIC ENDOVASCULAR STENT GRAFT N/A 03/31/2018   Procedure: THORACIC AORTIC ENDOVASCULAR STENT GRAFT;  Surgeon: Serafina Mitchell, MD;  [facility];  Service: Vascular;  Laterality: N/A;   TOTAL ABDOMINAL HYSTERECTOMY     complete   Social History   Occupational History   Occupation: Merchandiser, retail: UNEMPLOYED    Employer: DISABLED  Tobacco Use   Smoking status: Former    Packs/day: 0.13    Years: 30.00    Total pack years: 3.90    Types: Cigarettes   Smokeless tobacco: Never  Vaping  Use   Vaping Use: Never used   Substance and Sexual Activity   Alcohol use: No    Alcohol/week: 0.0 standard drinks of alcohol   Drug use: No   Sexual activity: Not on file

## 2022-09-08 NOTE — Progress Notes (Signed)
Does she have any follow-up?

## 2022-09-09 ENCOUNTER — Ambulatory Visit: Payer: Self-pay

## 2022-09-09 ENCOUNTER — Ambulatory Visit (INDEPENDENT_AMBULATORY_CARE_PROVIDER_SITE_OTHER): Payer: 59 | Admitting: Orthopedic Surgery

## 2022-09-09 DIAGNOSIS — M25551 Pain in right hip: Secondary | ICD-10-CM

## 2022-09-09 DIAGNOSIS — M25451 Effusion, right hip: Secondary | ICD-10-CM

## 2022-09-10 ENCOUNTER — Other Ambulatory Visit: Payer: Self-pay | Admitting: Surgical

## 2022-09-10 ENCOUNTER — Encounter: Payer: Self-pay | Admitting: Orthopedic Surgery

## 2022-09-10 ENCOUNTER — Telehealth: Payer: Self-pay | Admitting: Orthopedic Surgery

## 2022-09-10 MED ORDER — TRAMADOL HCL 50 MG PO TABS: 50.0000 mg | ORAL_TABLET | Freq: Every day | ORAL | 0 refills | Status: DC | PRN

## 2022-09-10 MED ORDER — METHYLPREDNISOLONE ACETATE 80 MG/ML IJ SUSP
80.0000 mg | INTRAMUSCULAR | Status: AC | PRN
  Administered 2022-09-09: 80 mg via INTRA_ARTICULAR

## 2022-09-10 MED ORDER — LIDOCAINE HCL 1 % IJ SOLN
5.0000 mL | INTRAMUSCULAR | Status: AC | PRN
  Administered 2022-09-09: 5 mL

## 2022-09-10 MED ORDER — BUPIVACAINE HCL 0.25 % IJ SOLN
4.0000 mL | INTRAMUSCULAR | Status: AC | PRN
  Administered 2022-09-09: 4 mL via INTRA_ARTICULAR

## 2022-09-10 NOTE — Telephone Encounter (Signed)
Called her and sent in Tramadol

## 2022-09-10 NOTE — Progress Notes (Signed)
Office Visit Note   Patient: [name]           Date of Birth: 07-19-1946           MRN: [id number] Visit Date: 09/09/2022 Requested by: Horald Pollen, MD [place],  [place] [address] PCP: Horald Pollen, MD  Subjective: Chief Complaint  Patient presents with   Other     Scan review    HPI: [name] is a 77 y.o. female who presents to the office reporting continued right hip and groin pain.  Since she was last seen she has had a CT scan.  She does have a history of back surgery with Dr. Saintclair Halsted.  She states her back pain is minimal.  Hard for her to lift the leg.  Denies any fevers or chills.  CT scan shows no significant arthritis or acute fracture.  Stress fracture or reaction would not necessarily be diagnosed with this modality..                ROS: All systems reviewed are negative as they relate to the chief complaint within the history of present illness.  Patient denies fevers or chills.  Assessment & Plan: Visit Diagnoses:  1. Pain in right hip     Plan: Impression is right groin pain with effusion in the right hip.  We were able to aspirate about 7 cc of clear serous fluid from the right hip.  This is sent for Gram stain cell count and anaerobic and aerobic culture.  We will refill her pain medicine and I want her to do minimal activity over the next 7 days.  See her back in 7 days for clinical recheck.  This could be occult arthritis which has flared with an effusion in the hip.  The fluid itself had no appearance of infection.  Could consider bone scan if symptoms persist beyond next week.  That would be about the only modality which could capture stress reaction in the proximal femur.  Alternatively we may also need to consider CT scanning of the back although with her excellent response to the anesthetic portion of the injection today I think it is most likely this is coming from her hip joint itself.  Follow-Up Instructions: No  follow-ups on file.   Orders:  Orders Placed This Encounter  Procedures   Anaerobic and Aerobic Culture   US Guided Needle Placement - No Linked Charges   Synovial Fluid Analysis, Complete   No orders of the defined types were placed in this encounter.     Procedures: Large Joint Inj: R hip joint on 09/09/2022 6:36 AM Indications: pain and diagnostic evaluation Details: 18 G 3.5 in needle, ultrasound-guided anterior approach  Arthrogram: No  Medications: 5 mL lidocaine 1 %; 80 mg methylPREDNISolone acetate 80 MG/ML; 4 mL bupivacaine 0.25 % Aspirate: 7 mL yellow; sent for lab analysis Outcome: tolerated well, no immediate complications Procedure, treatment alternatives, risks and benefits explained, specific risks discussed. Consent was given by the patient. Immediately prior to procedure a time out was called to verify the correct patient, procedure, equipment, support staff and site/side marked as required. Patient was prepped and draped in the usual sterile fashion.       Clinical Data: No additional findings.  Objective: Vital Signs: There were no vitals taken for this visit.  Physical Exam:  Constitutional: Patient appears well-developed HEENT:  Head: Normocephalic Eyes:EOM are normal Neck: Normal range of motion Cardiovascular: Normal rate  Pulmonary/chest: Effort normal Neurologic: Patient is alert Skin: Skin is warm Psychiatric: Patient has normal mood and affect  Ortho Exam: Ortho exam demonstrates continued groin pain with some hip flexor weakness on the right.  No nerve root tension signs.  No paresthesias.  Symptoms do not really radiate below the right knee.  No trochanteric tenderness is noted.  Specialty Comments:  No specialty comments available.  Imaging: No results found.   PMFS History: Patient Active Problem List   Diagnosis Date Noted   Right inguinal pain 08/20/2022   Right hip pain 08/20/2022   Recent urinary tract infection 05/08/2022    Chronic bilateral low back pain without sciatica 05/08/2022   Spinal stenosis of lumbar region without neurogenic claudication 05/08/2022   HFrEF (heart failure with reduced ejection fraction) ([place]) 12/02/2021   Stage 3b chronic kidney disease ([place]) 08/08/2021   Chronic insomnia 11/14/2019   Colon polyp 06/16/2018   Thoracic aortic aneurysm without rupture ([place]) 03/31/2018   Chronic renal insufficiency    AICD (automatic cardioverter/defibrillator) present    Ischemic cardiomyopathy    COPD (chronic obstructive pulmonary disease) ([place])    Hyperlipemia    Insomnia    Pre-diabetes 06/19/2017   Gastric polyp    Osteoporosis 05/11/2013   Hearing loss 04/29/2013   Mild cognitive impairment 04/29/2013   Pseudoaneurysm of aortic arch ([place]) 05/05/2012   Implantable cardioverter-defibrillator (ICD) in situ 08/06/2011   DDD (degenerative disc disease), lumbar 05/16/2011   History of pulmonary embolus (PE) 01/02/2011   UNSPECIFIED ANEMIA 07/03/2010   Essential hypertension 09/05/2009   DIVERTICULOSIS-COLON 08/14/2009   PERSONAL HX COLONIC POLYPS 04/24/2009   TOBACCO ABUSE 04/04/2009   Coronary artery disease involving native coronary artery of native heart with angina pectoris ([place]) 02/25/2009   GERD 10/19/2007   IBS 10/19/2007   COLONIC POLYPS, ADENOMATOUS 05/12/2007   Past Medical History:  Diagnosis Date   Abdominal pain, left lower quadrant 08/14/2009   Qualifier: Diagnosis of  By: Laney Potash, Pam     Abnormal CT scan, stomach    Abnormal LFTs 06/19/2017   AICD (automatic cardioverter/defibrillator) present    Dr.Allred follows   Aortic arch pseudoaneurysm ([place])    a. followed by Dr. Prescott Gum.   Arthritis    Asthma    Benign neoplasm of colon    CAD (coronary artery disease) 02/2009   a. anterior STEMI rx with BMS to prox LAD in 02/2009. b. ISR s/p PTCA 06/2009. c. ISR s/p thrombectomy & PTCA 03/2010 due to late stent thrombosis. // Myoview 04/2019: EF 28, ant, ant-sept,  inf-sept scar, no ischemia, high risk (stable>>cont med Rx)    Cardiomyopathy, ischemic 06/12/2011   Chronic renal insufficiency    stage 3   Chronic systolic CHF (congestive heart failure) ([place])    a. s/p ST. Jude ICD October 27, 2011.   Chronic systolic heart failure ([place]) 08/05/2011   Colon polyp    Complication of anesthesia    COPD (chronic obstructive pulmonary disease) ([place])    CORONARY ARTERY DISEASE 04/24/2009   Qualifier: Diagnosis of  By: Shane Crutch, Amy S    DDD (degenerative disc disease), lumbar 05/16/2011   Depression    "since my son died in 11/27/2022" 2017/10/26)   Diverticulosis    DIVERTICULOSIS-COLON 08/14/2009   Qualifier: Diagnosis of  By: Trellis Paganini PA-c, Amy S    DYSPHAGIA UNSPECIFIED 04/24/2009   Qualifier: Diagnosis of  By: Laney Potash, Pam     Dyspnea    "when I lay down at night"  Essential hypertension 09/05/2009   Qualifier: Diagnosis of  By: Harvest Dark CMA, Jennifer     Gastric polyp    GERD 10/19/2007   Qualifier: Diagnosis of  By: Rennie Plowman RN, Blanca Friend: Diagnosis of  By: Laney Potash, Pam     GERD (gastroesophageal reflux disease)    GI bleed    a. h/o GIB on DAPT, now on ASA only.   Headache 01/03/2013   Hearing loss    left ear   History of pulmonary embolus ([place]) 01/02/2011   Hyperlipemia    Hyperlipidemia, unspecified 04/24/2009   Qualifier: Diagnosis of  By: Trellis Paganini PA-c, Amy S    Hypertension    ICD-St.Jude 08/06/2011   S/p St. Jude ICD placement 08/05/11    Insomnia    Irritable bowel syndrome    Ischemic cardiomyopathy    EF 10-15%   Memory loss    Migraine headache without aura    Myocardial infarct ([place]) 2011 x 2   Dr. 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[place]) 05/05/2012   Pulmonary  embolism ([place]) 2011   Stroke [facility])    'When I was young." no residual   Thoracic aortic aneurysm ([place]) 03/31/2018   TOBACCO ABUSE 04/04/2009   Qualifier: Diagnosis of  By: Arvid Right  Qualifier: Diagnosis of  By: [name], MD, Jaquelyn Bitter    Transfusion history    ?'12 or '13   UNSPECIFIED ANEMIA 07/03/2010   Qualifier: Diagnosis of  By: Shelda Pal     Unspecified mastoiditis     Family History  Problem Relation Age of Onset   Colon cancer Mother    Other Brother        tumor in his brain   Bladder Cancer Brother    Throat cancer Brother    Cancer Son        kidney, spread to his liver   Breast cancer Cousin     Past Surgical History:  Procedure Laterality Date   ANGIOPLASTY  07/02/09, 04/01/10   BILATERAL SALPINGOOPHORECTOMY     CAD( bare metal stent)  02/2009   x 1   CAROTID-SUBCLAVIAN BYPASS GRAFT Left 03/31/2018   Procedure: LEFT SUBCLAVIAN ARTERY BYPASS GRAFT;  Surgeon: Serafina Mitchell, MD;  [facility];  Service: Vascular;  Laterality: Left;   CERVICAL SPINE SURGERY  08/08   COLONOSCOPY WITH PROPOFOL N/A 04/29/2016   Procedure: COLONOSCOPY WITH PROPOFOL;  Surgeon: [name], MD;  [facility];  Service: Gastroenterology;  Laterality: N/A;   ESOPHAGOGASTRODUODENOSCOPY N/A 12/09/2016   Procedure: ESOPHAGOGASTRODUODENOSCOPY (EGD);  Surgeon: [name], MD;  [facility];  Service: Gastroenterology;  Laterality: N/A;   ESOPHAGOGASTRODUODENOSCOPY (EGD) WITH PROPOFOL N/A 04/29/2016   Procedure: ESOPHAGOGASTRODUODENOSCOPY (EGD) WITH PROPOFOL;  Surgeon: [name], MD;  [facility];  Service: Gastroenterology;  Laterality: N/A;   EUS N/A 05/15/2016   Procedure: UPPER ENDOSCOPIC ULTRASOUND (EUS) RADIAL;  Surgeon: Milus Banister, MD;  [facility];  Service: Endoscopy;  Laterality: N/A;   IMPLANTABLE CARDIOVERTER DEFIBRILLATOR IMPLANT N/A 08/05/2011   Primary prevention SJM ICD  implanted,  Analyze ST study patient   INNER EAR SURGERY     left x 17  LUMBAR DISC SURGERY  02/2008   fusion   NASAL SEPTUM SURGERY     RIGHT HEART CATH N/A 12/11/2021   Procedure: RIGHT HEART CATH;  Surgeon: [name], MD;  Location: [facility];  Service: Cardiovascular;  Laterality: N/A;   THORACIC AORTIC ENDOVASCULAR STENT GRAFT N/A 03/31/2018   Procedure: THORACIC AORTIC ENDOVASCULAR STENT GRAFT;  Surgeon: Serafina Mitchell, MD;  [facility];  Service: Vascular;  Laterality: N/A;   TOTAL ABDOMINAL HYSTERECTOMY     complete   Social History   Occupational History   Occupation: Merchandiser, retail: UNEMPLOYED    Employer: DISABLED  Tobacco Use   Smoking status: Former    Packs/day: 0.13    Years: 30.00    Total pack years: 3.90    Types: Cigarettes   Smokeless tobacco: Never  Vaping Use   Vaping Use: Never used  Substance and Sexual Activity   Alcohol use: No    Alcohol/week: 0.0 standard drinks of alcohol   Drug use: No   Sexual activity: Not on file

## 2022-09-10 NOTE — Telephone Encounter (Signed)
Requesting pain medication needs to be called in before 1 or she wont be able to get it till Friday-- [name]

## 2022-09-12 ENCOUNTER — Other Ambulatory Visit: Payer: Self-pay | Admitting: Physician Assistant

## 2022-09-15 LAB — SYNOVIAL FLUID ANALYSIS, COMPLETE
Basophils, %: 0 %
Eosinophils-Synovial: 0 % (ref 0–2)
Lymphocytes-Synovial Fld: 0 % (ref 0–74)
Monocyte/Macrophage: 80 % — ABNORMAL HIGH (ref 0–69)
Neutrophil, Synovial: 20 % (ref 0–24)
Synoviocytes, %: 0 % (ref 0–15)
WBC, Synovial: 191 cells/uL — ABNORMAL HIGH (ref <150)

## 2022-09-15 LAB — ANAEROBIC AND AEROBIC CULTURE
AER RESULT:: NO GROWTH
MICRO NUMBER:: 14558832
MICRO NUMBER:: 14558833
SPECIMEN QUALITY:: ADEQUATE
SPECIMEN QUALITY:: ADEQUATE

## 2022-09-18 ENCOUNTER — Telehealth: Payer: Self-pay

## 2022-09-18 ENCOUNTER — Ambulatory Visit (INDEPENDENT_AMBULATORY_CARE_PROVIDER_SITE_OTHER): Payer: 59

## 2022-09-18 DIAGNOSIS — I255 Ischemic cardiomyopathy: Secondary | ICD-10-CM

## 2022-09-18 LAB — CUP PACEART REMOTE DEVICE CHECK
Battery Remaining Longevity: 0 mo
Battery Voltage: 2.6 V
Brady Statistic RV Percent Paced: 1 %
Date Time Interrogation Session: 20240222030039
HighPow Impedance: 80 Ohm
HighPow Impedance: 80 Ohm
Implantable Lead Connection Status: 753985
Implantable Lead Implant Date: 20130108
Implantable Lead Location: 753860
Implantable Pulse Generator Implant Date: 20130108
Lead Channel Impedance Value: 510 Ohm
Lead Channel Pacing Threshold Amplitude: 0.75 V
Lead Channel Pacing Threshold Pulse Width: 0.5 ms
Lead Channel Sensing Intrinsic Amplitude: 12 mV
Lead Channel Setting Pacing Amplitude: 2.5 V
Lead Channel Setting Pacing Pulse Width: 0.5 ms
Lead Channel Setting Sensing Sensitivity: 0.5 mV
Pulse Gen Serial Number: 1016523
Zone Setting Status: 755011

## 2022-09-18 NOTE — Telephone Encounter (Signed)
Alert received from [employer]:  Scheduled remote reviewed. Normal device function.   Device reached ERI, 1/21 - route to triage  Prior Allred Pt.  Pt advised of device at ERI.  Pt scheduled to see Dr. Myles Gip 09/25/2022 to discuss gen change.  All questions answered.

## 2022-09-24 ENCOUNTER — Ambulatory Visit (INDEPENDENT_AMBULATORY_CARE_PROVIDER_SITE_OTHER): Payer: 59 | Admitting: Orthopedic Surgery

## 2022-09-24 DIAGNOSIS — M25451 Effusion, right hip: Secondary | ICD-10-CM

## 2022-09-25 ENCOUNTER — Ambulatory Visit: Payer: 59 | Attending: Cardiovascular Disease | Admitting: Cardiovascular Disease

## 2022-09-25 ENCOUNTER — Encounter: Payer: Self-pay | Admitting: *Deleted

## 2022-09-25 ENCOUNTER — Encounter: Payer: Self-pay | Admitting: Cardiovascular Disease

## 2022-09-25 VITALS — BP 124/72 | HR 78 | Ht 62.0 in | Wt 168.8 lb

## 2022-09-25 DIAGNOSIS — I5020 Unspecified systolic (congestive) heart failure: Secondary | ICD-10-CM | POA: Diagnosis not present

## 2022-09-25 DIAGNOSIS — Z01812 Encounter for preprocedural laboratory examination: Secondary | ICD-10-CM

## 2022-09-25 NOTE — Progress Notes (Signed)
Electrophysiology Office Note:    Date:  09/25/2022   ID:  [name], DOB [birth year], MRN [id number]  PCP:  Horald Pollen, [place] [facility] Cardiologist:  [name], MD Electrophysiologist:  Thompson Grayer, MD (Inactive)     Referring MD: Horald Pollen, *   History of Present Illness:    [name] is a 77 y.o. female with a hx listed below, significant for CHFrEF, RBBB, CAD with ischemic cardiomyopathy who presents for follow-up for device management.  He has an Abbott single-chamber ICD for primary prevention in the setting of ischemic cardiomyopathy with severely reduced EF.  She has never had any shocks.  She has no complaints today other than hip and back pain.  she has no device related complaints -- no new tenderness, drainage, redness.     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 followed by Dr. Prescott Gum.   Arthritis    Asthma    Benign neoplasm of colon    CAD (coronary artery disease) 02/2009   a. anterior STEMI rx with BMS to prox LAD in 02/2009. b. ISR s/p PTCA 06/2009. c. ISR s/p thrombectomy & PTCA 03/2010 due to late stent thrombosis. // Myoview 04/2019: EF 28, ant, ant-sept, inf-sept scar, no ischemia, high risk (stable>>cont med Rx)    Cardiomyopathy, ischemic 06/12/2011   Chronic renal insufficiency    stage 3   Chronic systolic CHF (congestive heart failure) ([place])    a. s/p ST. Jude ICD October 31, 2011.   Chronic systolic heart failure ([place]) 08/05/2011   Colon polyp    Complication of anesthesia    COPD (chronic obstructive pulmonary disease) ([place])    CORONARY ARTERY DISEASE 04/24/2009   Qualifier: Diagnosis of  By: Shane Crutch, Amy S    DDD  (degenerative disc disease), lumbar 05/16/2011   Depression    "since my son died in 12/01/2022" October 30, 2017)   Diverticulosis    DIVERTICULOSIS-COLON 08/14/2009   Qualifier: Diagnosis of  By: Trellis Paganini PA-c, Amy S    DYSPHAGIA UNSPECIFIED 04/24/2009   Qualifier: Diagnosis of  By: Laney Potash, Pam     Dyspnea    "when I lay down at night"    Essential hypertension 09/05/2009   Qualifier: Diagnosis of  By: Harvest Dark CMA, Jennifer     Gastric polyp    GERD 10/19/2007   Qualifier: Diagnosis of  By: Rennie Plowman RN, Blanca Friend: Diagnosis of  By: Laney Potash, Pam     GERD (gastroesophageal reflux disease)    GI bleed    a. h/o GIB on DAPT, now on ASA only.   Headache 01/03/2013   Hearing loss    left ear   History of pulmonary embolus (PE) 01/02/2011   Hyperlipemia    Hyperlipidemia, unspecified 04/24/2009   Qualifier: Diagnosis of  By: Trellis Paganini PA-c, Amy S    Hypertension    ICD-St.Jude 08/06/2011   S/p St. Jude ICD placement 08/05/11    Insomnia    Irritable bowel syndrome    Ischemic cardiomyopathy    EF 10-15%   Memory loss    Migraine headache without aura    Myocardial infarct ([place]) 30-Oct-2009 x 2  Dr. 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[place]) 05/05/2012   Pulmonary embolism ([place]) 2011   Stroke [facility])    'When I was young." no residual   Thoracic aortic aneurysm ([place]) 03/31/2018   TOBACCO ABUSE 04/04/2009   Qualifier: Diagnosis of  By: Arvid Right  Qualifier: Diagnosis of  By: [name], MD, Jaquelyn Bitter    Transfusion history    ?'12 or '13   UNSPECIFIED ANEMIA 07/03/2010   Qualifier: Diagnosis of  By: Shelda Pal     Unspecified mastoiditis     Past Surgical History:  Procedure Laterality Date   ANGIOPLASTY   07/02/09, 04/01/10   BILATERAL SALPINGOOPHORECTOMY     CAD( bare metal stent)  02/2009   x 1   CAROTID-SUBCLAVIAN BYPASS GRAFT Left 03/31/2018   Procedure: LEFT SUBCLAVIAN ARTERY BYPASS GRAFT;  Surgeon: Serafina Mitchell, MD;  [facility];  Service: Vascular;  Laterality: Left;   CERVICAL SPINE SURGERY  08/08   COLONOSCOPY WITH PROPOFOL N/A 04/29/2016   Procedure: COLONOSCOPY WITH PROPOFOL;  Surgeon: [name], MD;  [facility];  Service: Gastroenterology;  Laterality: N/A;   ESOPHAGOGASTRODUODENOSCOPY N/A 12/09/2016   Procedure: ESOPHAGOGASTRODUODENOSCOPY (EGD);  Surgeon: [name], MD;  [facility]: [facility];  Service: Gastroenterology;  Laterality: N/A;   ESOPHAGOGASTRODUODENOSCOPY (EGD) WITH PROPOFOL N/A 04/29/2016   Procedure: ESOPHAGOGASTRODUODENOSCOPY (EGD) WITH PROPOFOL;  Surgeon: [name], MD;  [facility];  Service: Gastroenterology;  Laterality: N/A;   EUS N/A 05/15/2016   Procedure: UPPER ENDOSCOPIC ULTRASOUND (EUS) RADIAL;  Surgeon: Milus Banister, MD;  [facility];  Service: Endoscopy;  Laterality: N/A;   IMPLANTABLE CARDIOVERTER DEFIBRILLATOR IMPLANT N/A 08/05/2011   Primary prevention SJM ICD implanted,  Analyze ST study patient   INNER EAR SURGERY     left x 17   LUMBAR DISC SURGERY  02/2008   fusion   NASAL SEPTUM SURGERY     RIGHT HEART CATH N/A 12/11/2021   Procedure: RIGHT HEART CATH;  Surgeon: [name], MD;  Location: [facility];  Service: Cardiovascular;  Laterality: N/A;   THORACIC AORTIC ENDOVASCULAR STENT GRAFT N/A 03/31/2018   Procedure: THORACIC AORTIC ENDOVASCULAR STENT GRAFT;  Surgeon: Serafina Mitchell, MD;  [facility];  Service: Vascular;  Laterality: N/A;   TOTAL ABDOMINAL HYSTERECTOMY     complete    Current Medications: Current Meds  Medication Sig   acetaminophen (TYLENOL) 500 MG tablet Take 2 tablets (1,000 mg total) by mouth every 6 (six) hours as needed (back  pain.).   amitriptyline (ELAVIL) 50 MG tablet TAKE 1 TABLET (50 MG TOTAL) BY MOUTH AT BEDTIME.   aspirin 81 MG EC tablet Take 81 mg by mouth daily in the afternoon.   bisoprolol (ZEBETA) 5 MG tablet TAKE 1 TABLET BY MOUTH DAILY.   dexlansoprazole (DEXILANT) 60 MG capsule TAKE 1 CAPSULE (60 MG TOTAL) BY MOUTH DAILY. **PLEASE CALL OFFICE TO SCHEDULE FOLLOW UP   dicyclomine (BENTYL) 10 MG capsule Take 1 capsule (10 mg total) by mouth every 6 (six) hours as needed for spasms.   estradiol (ESTRACE) 0.1 MG/GM vaginal cream Place vaginally.   furosemide (LASIX) 40 MG tablet Take 1 tablet (40 mg  total) by mouth daily.   hydrALAZINE (APRESOLINE) 10 MG tablet TAKE 1 TABLET (10 MG TOTAL) BY MOUTH 3 (THREE) TIMES DAILY.   meloxicam (MOBIC) 15 MG tablet Take 1 tablet (15 mg total) by mouth daily.   nitroGLYCERIN (NITROSTAT) 0.4 MG SL tablet Place 1 tablet (0.4 mg total) under the tongue every 5 (five) minutes as needed for chest pain.   polyethylene glycol powder (GLYCOLAX/MIRALAX) powder Take 17 g by mouth daily as needed for mild constipation.   PROAIR HFA 108 (90 Base) MCG/ACT inhaler 1 puff daily as needed.   rosuvastatin (CRESTOR) 20 MG tablet TAKE 1 TABLET (20 MG TOTAL) BY MOUTH AT BEDTIME. PLEASE MAKE YEARLY APPT WITH DR. Burt Knack FOR OCTOBER 2022 FOR FUTURE REFILLS.   Tiotropium Bromide-Olodaterol (STIOLTO RESPIMAT) 2.5-2.5 MCG/ACT AERS Inhale 2 puffs into the lungs daily.   topiramate (TOPAMAX) 50 MG tablet Take 1 tablet (50 mg total) by mouth daily.   traMADol (ULTRAM) 50 MG tablet Take 1 tablet (50 mg total) by mouth daily as needed.     Allergies:   Sulfa antibiotics, Sulfonamide derivatives, and Zestril [lisinopril]   Social History   Socioeconomic History   Marital status: Divorced    Spouse name: Not on file   Number of children: 2   Years of education: 11   Highest education level: Not on file  Occupational History   Occupation: Unemployed    Fish farm manager: UNEMPLOYED    Employer:  DISABLED  Tobacco Use   Smoking status: Former    Packs/day: 0.13    Years: 30.00    Total pack years: 3.90    Types: Cigarettes   Smokeless tobacco: Never  Vaping Use   Vaping Use: Never used  Substance and Sexual Activity   Alcohol use: No    Alcohol/week: 0.0 standard drinks of alcohol   Drug use: No   Sexual activity: Not on file  Other Topics Concern   Not on file  Social History Narrative   Pt lives in [place] alone.  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   Patient has 11 th grade education.Right handed.   Caffeine- one cup daily         Social Determinants of Health   Financial Resource Strain: Low Risk  (12/13/2021)   Overall Financial Resource Strain (CARDIA)    Difficulty of Paying Living Expenses: Not hard at all  Food Insecurity: No Food Insecurity (12/13/2021)   Hunger Vital Sign    Worried About Running Out of Food in the Last Year: Never true    Ran Out of Food in the Last Year: Never true  Transportation Needs: No Transportation Needs (12/13/2021)   PRAPARE - Hydrologist (Medical): No    Lack of Transportation (Non-Medical): No  Physical Activity: Inactive (12/13/2021)   Exercise Vital Sign    Days of Exercise per Week: 0 days    Minutes of Exercise per Session: 0 min  Stress: No Stress Concern Present (12/13/2021)   [place]    Feeling of Stress : Not at all  Social Connections: Moderately Isolated (12/13/2021)   Social Connection and Isolation Panel [NHANES]    Frequency of Communication with Friends and Family: Three times a week    Frequency of Social Gatherings with Friends and Family: Three times a week    Attends Religious Services: 1 to 4 times per year    Active Member of Clubs or Organizations: No  Attends Archivist Meetings: Never    Marital Status: Divorced     Family History: The patient's family history includes  Bladder Cancer in her brother; Breast cancer in her cousin; Cancer in her son; Colon cancer in her mother; Other in her brother; Throat cancer in her brother.  ROS:   Please see the history of present illness.    All other systems reviewed and are negative.  EKGs/Labs/Other Studies Reviewed Today:     TTE 05/01/21: EF 25-30%  EKG:  Last EKG results: Sinus rhythm, RBBB, QRS 138   Recent Labs: 04/30/2022: ALT 12; BUN 24; Creatinine, Ser 1.85; Hemoglobin 12.1; Platelets 211; Potassium 4.5; Sodium 142     Physical Exam:    VS:  BP 124/72   Pulse 78   Ht '5\' 2"'$  (1.575 m)   Wt 168 lb 12.8 oz (76.6 kg)   SpO2 97%   BMI 30.87 kg/m     Wt Readings from Last 3 Encounters:  09/25/22 168 lb 12.8 oz (76.6 kg)  08/20/22 168 lb 4 oz (76.3 kg)  08/11/22 167 lb 12.3 oz (76.1 kg)     GEN: Well nourished, well developed in no acute distress CARDIAC: RRR, no murmurs, rubs, gallops The device site is normal -- no tenderness, edema, drainage, redness, threatened erosion. RESPIRATORY:  Normal work of breathing MUSCULOSKELETAL: no edema    ASSESSMENT & PLAN:    Abbott single chamber ICD at [facility]: will schedule generator change. I discussed the indication for the procedure and the logistics, risks, potential benefit, and after care. I specifically explained that risks include but are not limited to infection, bleeding,damage to blood vessels, lung, and the heart -- but risk of prolonged hospitalization, need for surgery, or the event of stroke, heart attack, or death are low but not zero.  Right bundle branch block: QRS < 150, so there is not likely to be significant benefit from CRT. CHFrEF: Appears well compensated today. On bisoprolol 5 mg, hydralazine 10 mg.  Dr. Burt Knack is her cardiologist.        Medication Adjustments/Labs and Tests Ordered: Current medicines are reviewed at length with the patient today.  Concerns regarding medicines are outlined above.  Orders Placed This Encounter   Procedures   EKG 12-Lead   No orders of the defined types were placed in this encounter.    Signed, Melida Quitter, MD  09/25/2022 9:47 AM    [place]

## 2022-09-25 NOTE — Patient Instructions (Addendum)
Medication Instructions:  Your physician recommends that you continue on your current medications as directed. Please refer to the Current Medication list given to you today.  *If you need a refill on your cardiac medications before your next appointment, please call your pharmacy*   Lab Work: See procedure instruction letter for pre procedure blood work needed.  If you have labs (blood work) drawn today and your tests are completely normal, you will receive your results only by: [place] (if you have MyChart) OR A paper copy in the mail If you have any lab test that is abnormal or we need to change your treatment, we will call you to review the results.   Testing/Procedures: Your physician recommends a battery change for your defibrillator.  Please see instruction letter given to you today.   Follow-Up: At [facility], you and your health needs are our priority.  As part of our continuing mission to provide you with exceptional heart care, we have created designated Provider Care Teams.  These Care Teams include your primary Cardiologist (physician) and Advanced Practice Providers (APPs -  Physician Assistants and Nurse Practitioners) who all work together to provide you with the care you need, when you need it.  We recommend signing up for the patient portal called "MyChart".  Sign up information is provided on this After Visit Summary.  MyChart is used to connect with patients for Virtual Visits (Telemedicine).  Patients are able to view lab/test results, encounter notes, upcoming appointments, etc.  Non-urgent messages can be sent to your provider as well.   To learn more about what you can do with MyChart, go to [email or web address].    Your next appointment:   2 week(s) after your defibrillator battery change  The format for your next appointment:   In Person  Provider:   Device clinic for a wound check     Thank you for choosing [facility]!!   [phone]  Other Instructions

## 2022-09-26 ENCOUNTER — Encounter: Payer: Self-pay | Admitting: Orthopedic Surgery

## 2022-09-26 NOTE — Progress Notes (Unsigned)
Office Visit Note   Patient: [name]           Date of Birth: 11-20-1945           MRN: [id number] Visit Date: 09/24/2022 Requested by: Horald Pollen, MD [place],  [place] [id number] PCP: Horald Pollen, MD  Subjective: Chief Complaint  Patient presents with   Right Hip - Follow-up    HPI: [name] is a 77 y.o. female who presents to the office reporting right hip pain.  She had a right hip injection 09/09/2022.  Intra-articular effusion was present.  She has gotten good relief.  Still has some pain but is much better overall.  Using no assistive devices to walk.  She is able to get around.  Not taking medication because of constipation..                ROS: All systems reviewed are negative as they relate to the chief complaint within the history of present illness.  Patient denies fevers or chills.  Assessment & Plan: Visit Diagnoses: No diagnosis found.  Plan: Impression is improvement in right hip pain.  Radiographs look pretty reasonable but she did have an effusion and significant pain.  Plan is 8-week return with ultrasound examination at that time and decision for or against repeat aspiration and injection then based on symptoms.  Follow-Up Instructions: No follow-ups on file.   Orders:  No orders of the defined types were placed in this encounter.  No orders of the defined types were placed in this encounter.     Procedures: No procedures performed   Clinical Data: No additional findings.  Objective: Vital Signs: There were no vitals taken for this visit.  Physical Exam:  Constitutional: Patient appears well-developed HEENT:  Head: Normocephalic Eyes:EOM are normal Neck: Normal range of motion Cardiovascular: Normal rate Pulmonary/chest: Effort normal Neurologic: Patient is alert Skin: Skin is warm Psychiatric: Patient has normal mood and affect  Ortho Exam: Ortho exam demonstrates minimal to no groin pain on  the right with internal/external Tatian of the leg.  Hip flexion strength 5+ out of 5.  No masses lymphadenopathy or skin changes noted in that right hip region.  Gait is nearly normal.    Specialty Comments:  No specialty comments available.  Imaging: No results found.   PMFS History: Patient Active Problem List   Diagnosis Date Noted   Right inguinal pain 08/20/2022   Right hip pain 08/20/2022   Recent urinary tract infection 05/08/2022   Chronic bilateral low back pain without sciatica 05/08/2022   Spinal stenosis of lumbar region without neurogenic claudication 05/08/2022   HFrEF (heart failure with reduced ejection fraction) ([place]) 12/02/2021   Stage 3b chronic kidney disease ([place]) 08/08/2021   Chronic insomnia 11/14/2019   Colon polyp 06/16/2018   Thoracic aortic aneurysm without rupture ([place]) 03/31/2018   Chronic renal insufficiency    AICD (automatic cardioverter/defibrillator) present    Ischemic cardiomyopathy    COPD (chronic obstructive pulmonary disease) ([place])    Hyperlipemia    Insomnia    Pre-diabetes 06/19/2017   Gastric polyp    Osteoporosis 05/11/2013   Hearing loss 04/29/2013   Mild cognitive impairment 04/29/2013   Pseudoaneurysm of aortic arch ([place]) 05/05/2012   Implantable cardioverter-defibrillator (ICD) in situ 08/06/2011   DDD (degenerative disc disease), lumbar 05/16/2011   History of pulmonary embolus (PE) 01/02/2011   UNSPECIFIED ANEMIA 07/03/2010   Essential hypertension 09/05/2009   DIVERTICULOSIS-COLON 08/14/2009  PERSONAL HX COLONIC POLYPS 04/24/2009   TOBACCO ABUSE 04/04/2009   Coronary artery disease involving native coronary artery of native heart with angina pectoris ([place]) 02/25/2009   GERD 10/19/2007   IBS 10/19/2007   COLONIC POLYPS, ADENOMATOUS 05/12/2007   Past Medical History:  Diagnosis Date   Abdominal pain, left lower quadrant 08/14/2009   Qualifier: Diagnosis of  By: Laney Potash, Pam     Abnormal CT scan, stomach     Abnormal LFTs 06/19/2017   AICD (automatic cardioverter/defibrillator) present    Dr.Allred follows   Aortic arch pseudoaneurysm ([place])    a. followed by Dr. Prescott Gum.   Arthritis    Asthma    Benign neoplasm of colon    CAD (coronary artery disease) 02/2009   a. anterior STEMI rx with BMS to prox LAD in 02/2009. b. ISR s/p PTCA 06/2009. c. ISR s/p thrombectomy & PTCA 03/2010 due to late stent thrombosis. // Myoview 04/2019: EF 28, ant, ant-sept, inf-sept scar, no ischemia, high risk (stable>>cont med Rx)    Cardiomyopathy, ischemic 06/12/2011   Chronic renal insufficiency    stage 3   Chronic systolic CHF (congestive heart failure) ([place])    a. s/p ST. Jude ICD 2011-11-02.   Chronic systolic heart failure ([place]) 08/05/2011   Colon polyp    Complication of anesthesia    COPD (chronic obstructive pulmonary disease) ([place])    CORONARY ARTERY DISEASE 04/24/2009   Qualifier: Diagnosis of  By: Shane Crutch, Amy S    DDD (degenerative disc disease), lumbar 05/16/2011   Depression    "since my son died in 12/03/22" 2017-11-01)   Diverticulosis    DIVERTICULOSIS-COLON 08/14/2009   Qualifier: Diagnosis of  By: Trellis Paganini PA-c, Amy S    DYSPHAGIA UNSPECIFIED 04/24/2009   Qualifier: Diagnosis of  By: Laney Potash, Pam     Dyspnea    "when I lay down at night"    Essential hypertension 09/05/2009   Qualifier: Diagnosis of  By: Harvest Dark CMA, Jennifer     Gastric polyp    GERD 10/19/2007   Qualifier: Diagnosis of  By: Rennie Plowman RN, Blanca Friend: Diagnosis of  By: Laney Potash, Pam     GERD (gastroesophageal reflux disease)    GI bleed    a. h/o GIB on DAPT, now on ASA only.   Headache 01/03/2013   Hearing loss    left ear   History of pulmonary embolus ([place]) 01/02/2011   Hyperlipemia    Hyperlipidemia, unspecified 04/24/2009   Qualifier: Diagnosis of  By: Trellis Paganini PA-c, Amy S    Hypertension    ICD-St.Jude 08/06/2011   S/p St. Jude ICD placement 08/05/11    Insomnia    Irritable bowel syndrome    Ischemic  cardiomyopathy    EF 10-15%   Memory loss    Migraine headache without aura    Myocardial infarct ([place]) 11-01-2009 x 2   Dr. 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[place]) 05/05/2012   Pulmonary embolism ([place]) 11-01-2009   Stroke [facility])    'When I was young." no residual   Thoracic aortic aneurysm ([place]) 03/31/2018   TOBACCO ABUSE 04/04/2009   Qualifier: Diagnosis of  By: Arvid Right  Qualifier:  Diagnosis of  By: [name], MD, Jaquelyn Bitter    Transfusion history    ?'12 or '13   UNSPECIFIED ANEMIA 07/03/2010   Qualifier: Diagnosis of  By: Shelda Pal     Unspecified mastoiditis     Family History  Problem Relation Age of Onset   Colon cancer Mother    Other Brother        tumor in his brain   Bladder Cancer Brother    Throat cancer Brother    Cancer Son        kidney, spread to his liver   Breast cancer Cousin     Past Surgical History:  Procedure Laterality Date   ANGIOPLASTY  07/02/09, 04/01/10   BILATERAL SALPINGOOPHORECTOMY     CAD( bare metal stent)  02/2009   x 1   CAROTID-SUBCLAVIAN BYPASS GRAFT Left 03/31/2018   Procedure: LEFT SUBCLAVIAN ARTERY BYPASS GRAFT;  Surgeon: Serafina Mitchell, MD;  [facility];  Service: Vascular;  Laterality: Left;   CERVICAL SPINE SURGERY  08/08   COLONOSCOPY WITH PROPOFOL N/A 04/29/2016   Procedure: COLONOSCOPY WITH PROPOFOL;  Surgeon: [name], MD;  Location: [facility];  Service: Gastroenterology;  Laterality: N/A;   ESOPHAGOGASTRODUODENOSCOPY N/A 12/09/2016   Procedure: ESOPHAGOGASTRODUODENOSCOPY (EGD);  Surgeon: [name], MD;  Location: [facility];  Service: Gastroenterology;  Laterality: N/A;   ESOPHAGOGASTRODUODENOSCOPY (EGD) WITH PROPOFOL  N/A 04/29/2016   Procedure: ESOPHAGOGASTRODUODENOSCOPY (EGD) WITH PROPOFOL;  Surgeon: [name], MD;  [facility];  Service: Gastroenterology;  Laterality: N/A;   EUS N/A 05/15/2016   Procedure: UPPER ENDOSCOPIC ULTRASOUND (EUS) RADIAL;  Surgeon: Milus Banister, MD;  [facility];  Service: Endoscopy;  Laterality: N/A;   IMPLANTABLE CARDIOVERTER DEFIBRILLATOR IMPLANT N/A 08/05/2011   Primary prevention SJM ICD implanted,  Analyze ST study patient   INNER EAR SURGERY     left x 17   LUMBAR DISC SURGERY  02/2008   fusion   NASAL SEPTUM SURGERY     RIGHT HEART CATH N/A 12/11/2021   Procedure: RIGHT HEART CATH;  Surgeon: [name], MD;  Location: [facility] [facility];  Service: Cardiovascular;  Laterality: N/A;   THORACIC AORTIC ENDOVASCULAR STENT GRAFT N/A 03/31/2018   Procedure: THORACIC AORTIC ENDOVASCULAR STENT GRAFT;  Surgeon: Serafina Mitchell, MD;  [facility];  Service: Vascular;  Laterality: N/A;   TOTAL ABDOMINAL HYSTERECTOMY     complete   Social History   Occupational History   Occupation: Merchandiser, retail: UNEMPLOYED    Employer: DISABLED  Tobacco Use   Smoking status: Former    Packs/day: 0.13    Years: 30.00    Total pack years: 3.90    Types: Cigarettes   Smokeless tobacco: Never  Vaping Use   Vaping Use: Never used  Substance and Sexual Activity   Alcohol use: No    Alcohol/week: 0.0 standard drinks of alcohol   Drug use: No   Sexual activity: Not on file

## 2022-10-13 NOTE — Progress Notes (Signed)
Remote ICD transmission.   

## 2022-10-27 ENCOUNTER — Ambulatory Visit: Payer: 59 | Attending: Cardiovascular Disease

## 2022-10-27 DIAGNOSIS — Z01812 Encounter for preprocedural laboratory examination: Secondary | ICD-10-CM

## 2022-10-27 DIAGNOSIS — I5020 Unspecified systolic (congestive) heart failure: Secondary | ICD-10-CM

## 2022-10-27 LAB — BASIC METABOLIC PANEL
BUN/Creatinine Ratio: 13 (ref 12–28)
BUN: 20 mg/dL (ref 8–27)
CO2: 22 mmol/L (ref 20–29)
Calcium: 9.2 mg/dL (ref 8.7–10.3)
Chloride: 112 mmol/L — ABNORMAL HIGH (ref 96–106)
Creatinine, Ser: 1.49 mg/dL — ABNORMAL HIGH (ref 0.57–1.00)
Glucose: 139 mg/dL — ABNORMAL HIGH (ref 70–99)
Potassium: 4.4 mmol/L (ref 3.5–5.2)
Sodium: 142 mmol/L (ref 134–144)
eGFR: 36 mL/min/{1.73_m2} — ABNORMAL LOW (ref >59)

## 2022-10-27 LAB — CBC
Hematocrit: 34.8 % (ref 34.0–46.6)
Hemoglobin: 11.5 g/dL (ref 11.1–15.9)
MCH: 32.8 pg (ref 26.6–33.0)
MCHC: 33 g/dL (ref 31.5–35.7)
MCV: 99 fL — ABNORMAL HIGH (ref 79–97)
Platelets: 229 10*3/uL (ref 150–450)
RBC: 3.51 x10E6/uL — ABNORMAL LOW (ref 3.77–5.28)
RDW: 15 % (ref 11.7–15.4)
WBC: 6.7 10*3/uL (ref 3.4–10.8)

## 2022-11-03 NOTE — Pre-Procedure Instructions (Signed)
Attempted to call patient regarding procedure instructions.  Left voicemail on the following items: Arrival time 0800 Nothing to eat or drink after midnight No meds AM of procedure Responsible person to drive you home and stay with you for 24 hrs Wash with special soap night before and morning of procedure  

## 2022-11-04 ENCOUNTER — Ambulatory Visit (HOSPITAL_COMMUNITY)
Admission: RE | Admit: 2022-11-04 | Discharge: 2022-11-04 | Disposition: A | Discharge status: Home / Self Care | Payer: 59 | Attending: Cardiovascular Disease | Admitting: Cardiovascular Disease

## 2022-11-04 ENCOUNTER — Other Ambulatory Visit: Payer: Self-pay

## 2022-11-04 ENCOUNTER — Encounter (HOSPITAL_COMMUNITY): Payer: Self-pay | Admitting: Cardiovascular Disease

## 2022-11-04 ENCOUNTER — Ambulatory Visit (HOSPITAL_COMMUNITY)
Admission: RE | Disposition: A | Discharge status: Home / Self Care | Payer: 59 | Source: Home / Self Care | Attending: Cardiovascular Disease

## 2022-11-04 DIAGNOSIS — I2510 Atherosclerotic heart disease of native coronary artery without angina pectoris: Secondary | ICD-10-CM | POA: Diagnosis not present

## 2022-11-04 DIAGNOSIS — N183 Chronic kidney disease, stage 3 unspecified: Secondary | ICD-10-CM | POA: Insufficient documentation

## 2022-11-04 DIAGNOSIS — Z87891 Personal history of nicotine dependence: Secondary | ICD-10-CM | POA: Insufficient documentation

## 2022-11-04 DIAGNOSIS — I130 Hypertensive heart and chronic kidney disease with heart failure and stage 1 through stage 4 chronic kidney disease, or unspecified chronic kidney disease: Secondary | ICD-10-CM | POA: Insufficient documentation

## 2022-11-04 DIAGNOSIS — Z4502 Encounter for adjustment and management of automatic implantable cardiac defibrillator: Secondary | ICD-10-CM | POA: Diagnosis not present

## 2022-11-04 DIAGNOSIS — I5020 Unspecified systolic (congestive) heart failure: Secondary | ICD-10-CM

## 2022-11-04 DIAGNOSIS — I5022 Chronic systolic (congestive) heart failure: Secondary | ICD-10-CM | POA: Insufficient documentation

## 2022-11-04 DIAGNOSIS — I255 Ischemic cardiomyopathy: Secondary | ICD-10-CM | POA: Diagnosis not present

## 2022-11-04 DIAGNOSIS — Z79899 Other long term (current) drug therapy: Secondary | ICD-10-CM | POA: Diagnosis not present

## 2022-11-04 DIAGNOSIS — I4510 Unspecified right bundle-branch block: Secondary | ICD-10-CM | POA: Diagnosis not present

## 2022-11-04 HISTORY — PX: ICD GENERATOR CHANGEOUT: EP1231

## 2022-11-04 SURGERY — ICD GENERATOR CHANGEOUT

## 2022-11-04 MED ORDER — MIDAZOLAM HCL 5 MG/5ML IJ SOLN
INTRAMUSCULAR | Status: DC | PRN
  Administered 2022-11-04 (×2): 1 mg via INTRAVENOUS

## 2022-11-04 MED ORDER — CEFAZOLIN SODIUM-DEXTROSE 2-4 GM/100ML-% IV SOLN
2.0000 g | INTRAVENOUS | Status: AC
  Administered 2022-11-04: 2 g via INTRAVENOUS

## 2022-11-04 MED ORDER — LIDOCAINE HCL (PF) 1 % IJ SOLN
INTRAMUSCULAR | Status: AC
  Filled 2022-11-04: qty 60

## 2022-11-04 MED ORDER — ACETAMINOPHEN 325 MG PO TABS: 325.0000 mg | ORAL_TABLET | ORAL | Status: DC | PRN

## 2022-11-04 MED ORDER — FENTANYL CITRATE (PF) 100 MCG/2ML IJ SOLN
INTRAMUSCULAR | Status: DC | PRN
  Administered 2022-11-04 (×2): 25 ug via INTRAVENOUS

## 2022-11-04 MED ORDER — FENTANYL CITRATE (PF) 100 MCG/2ML IJ SOLN
INTRAMUSCULAR | Status: AC
  Filled 2022-11-04: qty 2

## 2022-11-04 MED ORDER — ONDANSETRON HCL 4 MG/2ML IJ SOLN: 4.0000 mg | Freq: Four times a day (QID) | INTRAMUSCULAR | Status: DC | PRN

## 2022-11-04 MED ORDER — CHLORHEXIDINE GLUCONATE 4 % EX LIQD: 4.0000 | Freq: Once | CUTANEOUS | Status: DC

## 2022-11-04 MED ORDER — HEPARIN (PORCINE) IN NACL 1000-0.9 UT/500ML-% IV SOLN: INTRAVENOUS | Status: DC | PRN

## 2022-11-04 MED ORDER — SODIUM CHLORIDE 0.9 % IV SOLN
80.0000 mg | INTRAVENOUS | Status: AC
  Administered 2022-11-04: 80 mg

## 2022-11-04 MED ORDER — LIDOCAINE HCL (PF) 1 % IJ SOLN
INTRAMUSCULAR | Status: DC | PRN
  Administered 2022-11-04: 60 mL

## 2022-11-04 MED ORDER — CEFAZOLIN SODIUM-DEXTROSE 2-4 GM/100ML-% IV SOLN
INTRAVENOUS | Status: AC
  Filled 2022-11-04: qty 100

## 2022-11-04 MED ORDER — MIDAZOLAM HCL 2 MG/2ML IJ SOLN
INTRAMUSCULAR | Status: AC
  Filled 2022-11-04: qty 2

## 2022-11-04 MED ORDER — SODIUM CHLORIDE 0.9 % IV SOLN: INTRAVENOUS | Status: DC

## 2022-11-04 MED ORDER — SODIUM CHLORIDE 0.9 % IV SOLN
INTRAVENOUS | Status: AC
  Filled 2022-11-04: qty 2

## 2022-11-04 SURGICAL SUPPLY — 8 items
CABLE SURGICAL S-101-97-12 (CABLE) ×2 IMPLANT
DEVICE DISSECT PLASMABLAD 3.0S (MISCELLANEOUS) IMPLANT
ICD GALLANT VR CDVRA500Q (ICD Generator) IMPLANT
PAD DEFIB RADIO PHYSIO CONN (PAD) ×2 IMPLANT
PLASMABLADE 3.0S (MISCELLANEOUS) ×1
POUCH AIGIS-R ANTIBACT ICD (Mesh General) ×1 IMPLANT
POUCH AIGIS-R ANTIBACT ICD LRG (Mesh General) IMPLANT
TRAY PACEMAKER INSERTION (PACKS) ×2 IMPLANT

## 2022-11-04 NOTE — H&P (Signed)
Electrophysiology Office Note:    Date:  11/04/2022   ID:  [name], DOB 27-Jul-1946, MRN [id number]  PCP:  Georgina Quint, MD   [facility] Providers Cardiologist:  Tonny Bollman, MD Electrophysiologist:  Hillis Range, MD (Inactive)     Referring MD: No ref. provider found   History of Present Illness:    [name] is a 77 y.o. female with a hx listed below, significant for CHFrEF, RBBB, CAD with ischemic cardiomyopathy who presents for follow-up for device management.  He has an Abbott single-chamber ICD for primary prevention in the setting of ischemic cardiomyopathy with severely reduced EF.  She has never had any shocks.  She has no complaints today other than hip and back pain.  she has no device related complaints -- no new tenderness, drainage, redness.     Past Medical History:  Diagnosis Date   Abdominal pain, left lower quadrant 08/14/2009   Qualifier: Diagnosis of  By: Dorian Pod, Pam     Abnormal CT scan, stomach    Abnormal LFTs 06/19/2017   AICD (automatic cardioverter/defibrillator) present    Dr.Allred follows   Aortic arch pseudoaneurysm (HCC)    a. followed by Dr. Donata Clay.   Arthritis    Asthma    Benign neoplasm of colon    CAD (coronary artery disease) 02/2009   a. anterior STEMI rx with BMS to prox LAD in 02/2009. b. ISR s/p PTCA 06/2009. c. ISR s/p thrombectomy & PTCA 03/2010 due to late stent thrombosis. // Myoview 04/2019: EF 28, ant, ant-sept, inf-sept scar, no ischemia, high risk (stable>>cont med Rx)    Cardiomyopathy, ischemic 06/12/2011   Chronic renal insufficiency    stage 3   Chronic systolic CHF (congestive heart failure) (HCC)    a. s/p ST. Jude ICD 2011/11/17.   Chronic systolic heart failure (HCC) 08/05/2011   Colon polyp    Complication of anesthesia    COPD (chronic obstructive pulmonary disease) (HCC)    CORONARY ARTERY DISEASE 04/24/2009   Qualifier: Diagnosis of  By: Myrtie Hawk, Amy S    DDD  (degenerative disc disease), lumbar 05/16/2011   Depression    "since my son died in 2022/11/17" Nov 16, 2017)   Diverticulosis    DIVERTICULOSIS-COLON 08/14/2009   Qualifier: Diagnosis of  By: Monica Becton PA-c, Amy S    DYSPHAGIA UNSPECIFIED 04/24/2009   Qualifier: Diagnosis of  By: Dorian Pod, Pam     Dyspnea    "when I lay down at night"    Essential hypertension 09/05/2009   Qualifier: Diagnosis of  By: Yancey Flemings CMA, Jennifer     Gastric polyp    GERD 10/19/2007   Qualifier: Diagnosis of  By: Luberta Robertson RN, Eli Phillips: Diagnosis of  By: Dorian Pod, Pam     GERD (gastroesophageal reflux disease)    GI bleed    a. h/o GIB on DAPT, now on ASA only.   Headache 01/03/2013   Hearing loss    left ear   History of pulmonary embolus (PE) 01/02/2011   Hyperlipemia    Hyperlipidemia, unspecified 04/24/2009   Qualifier: Diagnosis of  By: Monica Becton PA-c, Amy S    Hypertension    ICD-St.Jude 08/06/2011   S/p St. Jude ICD placement 08/05/11    Insomnia    Irritable bowel syndrome    Ischemic cardiomyopathy    EF 10-15%   Memory loss    Migraine headache without aura    Myocardial infarct (HCC) 2011 x 2  Dr. Cooper,cardiology    PERSONAL HX COLONIC POLYPS 04/24/2009   Qualifier: Diagnosis of  By: Myrtie Hawk, Amy S November, 2011 colonoscopy demonstrated a sessile cecal polyp and sigmoid polyp    Pneumonia    PONV (postoperative nausea and vomiting)    Pre-diabetes    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   Pulmonary embolism (HCC) 2011   Stroke [facility])    'When I was young." no residual   Thoracic aortic aneurysm (HCC) 03/31/2018   TOBACCO ABUSE 04/04/2009   Qualifier: Diagnosis of  By: Jolene Provost  Qualifier: Diagnosis of  By: Riley Kill, MD, Johny Sax    Transfusion history    ?'12 or '13   UNSPECIFIED ANEMIA 07/03/2010   Qualifier: Diagnosis of  By: Omer Jack     Unspecified mastoiditis     Past Surgical History:  Procedure Laterality Date   ANGIOPLASTY   07/02/09, 04/01/10   BILATERAL SALPINGOOPHORECTOMY     CAD( bare metal stent)  02/2009   x 1   CAROTID-SUBCLAVIAN BYPASS GRAFT Left 03/31/2018   Procedure: LEFT SUBCLAVIAN ARTERY BYPASS GRAFT;  Surgeon: Nada Libman, MD;  Location: [facility] OR;  Service: Vascular;  Laterality: Left;   CERVICAL SPINE SURGERY  08/08   COLONOSCOPY WITH PROPOFOL N/A 04/29/2016   Procedure: COLONOSCOPY WITH PROPOFOL;  Surgeon: Ruffin Frederick, MD;  Location: [facility] ENDOSCOPY;  Service: Gastroenterology;  Laterality: N/A;   ESOPHAGOGASTRODUODENOSCOPY N/A 12/09/2016   Procedure: ESOPHAGOGASTRODUODENOSCOPY (EGD);  Surgeon: Ruffin Frederick, MD;  Location: [facility];  Service: Gastroenterology;  Laterality: N/A;   ESOPHAGOGASTRODUODENOSCOPY (EGD) WITH PROPOFOL N/A 04/29/2016   Procedure: ESOPHAGOGASTRODUODENOSCOPY (EGD) WITH PROPOFOL;  Surgeon: Ruffin Frederick, MD;  Location: [facility] ENDOSCOPY;  Service: Gastroenterology;  Laterality: N/A;   EUS N/A 05/15/2016   Procedure: UPPER ENDOSCOPIC ULTRASOUND (EUS) RADIAL;  Surgeon: Rachael Fee, MD;  Location: [facility] ENDOSCOPY;  Service: Endoscopy;  Laterality: N/A;   IMPLANTABLE CARDIOVERTER DEFIBRILLATOR IMPLANT N/A 08/05/2011   Primary prevention SJM ICD implanted,  Analyze ST study patient   INNER EAR SURGERY     left x 17   LUMBAR DISC SURGERY  02/2008   fusion   NASAL SEPTUM SURGERY     RIGHT HEART CATH N/A 12/11/2021   Procedure: RIGHT HEART CATH;  Surgeon: Tonny Bollman, MD;  Location: [facility] INVASIVE CV LAB;  Service: Cardiovascular;  Laterality: N/A;   THORACIC AORTIC ENDOVASCULAR STENT GRAFT N/A 03/31/2018   Procedure: THORACIC AORTIC ENDOVASCULAR STENT GRAFT;  Surgeon: Nada Libman, MD;  Location: [facility] OR;  Service: Vascular;  Laterality: N/A;   TOTAL ABDOMINAL HYSTERECTOMY     complete    Current Medications: Current Meds  Medication Sig   acetaminophen (TYLENOL) 500 MG tablet Take 2 tablets (1,000 mg total) by mouth every 6 (six) hours as needed (back  pain.).   amitriptyline (ELAVIL) 50 MG tablet TAKE 1 TABLET (50 MG TOTAL) BY MOUTH AT BEDTIME.   aspirin 81 MG EC tablet Take 81 mg by mouth at bedtime.   bisoprolol (ZEBETA) 5 MG tablet TAKE 1 TABLET BY MOUTH DAILY. (Patient taking differently: Take 5 mg by mouth at bedtime.)   dexlansoprazole (DEXILANT) 60 MG capsule TAKE 1 CAPSULE (60 MG TOTAL) BY MOUTH DAILY. **PLEASE CALL OFFICE TO SCHEDULE FOLLOW UP   furosemide (LASIX) 40 MG tablet Take 1 tablet (40 mg total) by mouth daily. (Patient taking differently: Take 40 mg by mouth at bedtime.)   hydrALAZINE (APRESOLINE) 10 MG tablet TAKE 1 TABLET (10  MG TOTAL) BY MOUTH 3 (THREE) TIMES DAILY.   meloxicam (MOBIC) 15 MG tablet Take 1 tablet (15 mg total) by mouth daily.   polyethylene glycol powder (GLYCOLAX/MIRALAX) powder Take 17 g by mouth daily as needed for mild constipation.   PREMARIN vaginal cream Place 0.5 g vaginally daily as needed (irritation.).   rosuvastatin (CRESTOR) 20 MG tablet TAKE 1 TABLET (20 MG TOTAL) BY MOUTH AT BEDTIME. PLEASE MAKE YEARLY APPT WITH DR. Excell Seltzer FOR OCTOBER 2022 FOR FUTURE REFILLS.   Tiotropium Bromide-Olodaterol (STIOLTO RESPIMAT) 2.5-2.5 MCG/ACT AERS Inhale 2 puffs into the lungs daily.   topiramate (TOPAMAX) 50 MG tablet Take 1 tablet (50 mg total) by mouth daily. (Patient taking differently: Take 50 mg by mouth at bedtime.)   traMADol (ULTRAM) 50 MG tablet Take 1 tablet (50 mg total) by mouth daily as needed.     Allergies:   Sulfa antibiotics, Sulfonamide derivatives, and Zestril [lisinopril]   Social History   Socioeconomic History   Marital status: Divorced    Spouse name: Not on file   Number of children: 2   Years of education: 11   Highest education level: Not on file  Occupational History   Occupation: Unemployed    Associate Professor: UNEMPLOYED    Employer: DISABLED  Tobacco Use   Smoking status: Former    Packs/day: 0.13    Years: 30.00    Additional pack years: 0.00    Total pack years: 3.90     Types: Cigarettes   Smokeless tobacco: Never  Vaping Use   Vaping Use: Never used  Substance and Sexual Activity   Alcohol use: No    Alcohol/week: 0.0 standard drinks of alcohol   Drug use: No   Sexual activity: Not on file  Other Topics Concern   Not on file  Social History Narrative   Pt lives in [place] alone.  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   Patient has 11 th grade education.Right handed.   Caffeine- one cup daily         Social Determinants of Health   Financial Resource Strain: Low Risk  (12/13/2021)   Overall Financial Resource Strain (CARDIA)    Difficulty of Paying Living Expenses: Not hard at all  Food Insecurity: No Food Insecurity (12/13/2021)   Hunger Vital Sign    Worried About Running Out of Food in the Last Year: Never true    Ran Out of Food in the Last Year: Never true  Transportation Needs: No Transportation Needs (12/13/2021)   PRAPARE - Administrator, Civil Service (Medical): No    Lack of Transportation (Non-Medical): No  Physical Activity: Inactive (12/13/2021)   Exercise Vital Sign    Days of Exercise per Week: 0 days    Minutes of Exercise per Session: 0 min  Stress: No Stress Concern Present (12/13/2021)   [employer] of Occupational Health - Occupational Stress Questionnaire    Feeling of Stress : Not at all  Social Connections: Moderately Isolated (12/13/2021)   Social Connection and Isolation Panel [NHANES]    Frequency of Communication with Friends and Family: Three times a week    Frequency of Social Gatherings with Friends and Family: Three times a week    Attends Religious Services: 1 to 4 times per year    Active Member of Clubs or Organizations: No    Attends Banker Meetings: Never    Marital Status: Divorced     Family History: The patient's family history includes  Bladder Cancer in her brother; Breast cancer in her cousin; Cancer in her son; Colon cancer in her mother; Other in her  brother; Throat cancer in her brother.  ROS:   Please see the history of present illness.    All other systems reviewed and are negative.  EKGs/Labs/Other Studies Reviewed Today:     TTE 05/01/21: EF 25-30%  EKG:  Last EKG results: Sinus rhythm, RBBB, QRS 138   Recent Labs: 04/30/2022: ALT 12 10/27/2022: BUN 20; Creatinine, Ser 1.49; Hemoglobin 11.5; Platelets 229; Potassium 4.4; Sodium 142     Physical Exam:    VS:  BP 131/71   Pulse 83   Temp 98.5 F (36.9 C) (Oral)   Resp 18   Ht 5\' 2"  (1.575 m)   Wt 76.2 kg   SpO2 93%   BMI 30.73 kg/m     Wt Readings from Last 3 Encounters:  11/04/22 76.2 kg  09/25/22 76.6 kg  08/20/22 76.3 kg     GEN: Well nourished, well developed in no acute distress CARDIAC: RRR, no murmurs, rubs, gallops The device site is normal -- no tenderness, edema, drainage, redness, threatened erosion. RESPIRATORY:  Normal work of breathing MUSCULOSKELETAL: no edema    ASSESSMENT & PLAN:    Abbott single chamber ICD at ERI: will schedule generator change. I discussed the indication for the procedure and the logistics, risks, potential benefit, and after care. I specifically explained that risks include but are not limited to infection, bleeding,damage to blood vessels, lung, and the heart -- but risk of prolonged hospitalization, need for surgery, or the event of stroke, heart attack, or death are low but not zero.  Right bundle branch block: QRS < 150, so there is not likely to be significant benefit from CRT. CHFrEF: Appears well compensated today. On bisoprolol 5 mg, hydralazine 10 mg.  Dr. Excell Seltzerooper is her cardiologist.        Medication Adjustments/Labs and Tests Ordered: Current medicines are reviewed at length with the patient today.  Concerns regarding medicines are outlined above.  Orders Placed This Encounter  Procedures   Diet NPO time specified   Informed Consent Details: Physician/Practitioner Attestation; Transcribe to consent form  and obtain patient signature   Initiate Pre-op Protocol   Apply Cardiac Implantable Device Care Plan   Void on call to EP Lab   Electrode Placement Place arm electrodes on posterior shoulders   Confirm CBC and BMP (or CMP) results within 7 days for inpatient and 30 days for outpatient:   Considerations: If a patient has had ENT surgery or has facial trauma with packing in place, coordinate with the physician to obtain the nasal swabs.  Other body sites may not be used to screen for MRSA.   Pre-admission testing diagnosis   SCRUB WITH Chlorhexidine (HIBICLENS) 4%   EP PPM/ICD IMPLANT   Insert peripheral IV   Meds ordered this encounter  Medications   0.9 %  sodium chloride infusion   gentamicin (GARAMYCIN) 80 mg in sodium chloride 0.9 % 500 mL irrigation   chlorhexidine (HIBICLENS) 4 % liquid 4 Application   ceFAZolin (ANCEF) IVPB 2g/100 mL premix    Order Specific Question:   Indication:    Answer:   Surgical Prophylaxis     Signed, Maurice SmallAugustus E Zekiel Torian, MD  11/04/2022 10:31 AM    [facility]

## 2022-11-04 NOTE — Discharge Instructions (Signed)

## 2022-11-05 ENCOUNTER — Encounter (HOSPITAL_COMMUNITY): Payer: Self-pay | Admitting: Cardiovascular Disease

## 2022-11-05 MED FILL — Midazolam HCl Inj 2 MG/2ML (Base Equivalent): INTRAMUSCULAR | Qty: 2 | Status: AC

## 2022-11-19 ENCOUNTER — Ambulatory Visit: Payer: 59 | Attending: Cardiovascular Disease

## 2022-11-19 ENCOUNTER — Ambulatory Visit (INDEPENDENT_AMBULATORY_CARE_PROVIDER_SITE_OTHER): Payer: 59 | Admitting: Orthopedic Surgery

## 2022-11-19 DIAGNOSIS — M25451 Effusion, right hip: Secondary | ICD-10-CM

## 2022-11-19 DIAGNOSIS — I255 Ischemic cardiomyopathy: Secondary | ICD-10-CM

## 2022-11-19 LAB — CUP PACEART INCLINIC DEVICE CHECK
Battery Remaining Longevity: 123 mo
Brady Statistic RV Percent Paced: 0 %
Date Time Interrogation Session: 20240424202256
HighPow Impedance: 67.5 Ohm
Implantable Lead Connection Status: 753985
Implantable Lead Implant Date: 20130108
Implantable Lead Location: 753860
Implantable Pulse Generator Implant Date: 20240409
Lead Channel Impedance Value: 487.5 Ohm
Lead Channel Pacing Threshold Amplitude: 0.75 V
Lead Channel Pacing Threshold Amplitude: 0.75 V
Lead Channel Pacing Threshold Pulse Width: 0.5 ms
Lead Channel Pacing Threshold Pulse Width: 0.5 ms
Lead Channel Sensing Intrinsic Amplitude: 12 mV
Lead Channel Setting Pacing Amplitude: 2.5 V
Lead Channel Setting Pacing Pulse Width: 0.5 ms
Lead Channel Setting Sensing Sensitivity: 0.5 mV
Pulse Gen Serial Number: 211016490
Zone Setting Status: 755011

## 2022-11-19 NOTE — Progress Notes (Addendum)
Wound check appointment. Steri-strips removed. Wound without redness or edema. Incision edges approximated.  There is a moderate sized hematoma at device pocket site.  Patient is currently taking an 81 mg ASA daily  Reviewed with Yevette Edwards, PA-C who orders patient to hold her ASA for 1 week and re-evaluate next week on 5/3 in device clinic.     Normal device function. Thresholds, sensing, and impedances consistent with implant measurements. Device programmed for appropriate safety margin for chronic lead. Histogram distribution appropriate for patient and level of activity. No mode switches or ventricular arrhythmias noted. Patient educated about wound care and shock plan.  This is a gen change only, no further restrictions with arm. ROV in 3 months with implanting physician.

## 2022-11-19 NOTE — Patient Instructions (Addendum)
   After Your ICD (Implantable Cardiac Defibrillator)    Monitor your defibrillator site for redness, swelling, and drainage. Call the device clinic at [phone] if you experience these symptoms or fever/chills.  Your incision was closed with Steri-strips or staples:  You may shower 7 days after your procedure and wash your incision with soap and water. Avoid lotions, ointments, or perfumes over your incision until it is well-healed.  You do have some swelling at your device site, small hematoma present in the pocket.  Please hold your Aspirin for 1 week. Monitor for any signs of infection and call our office if any concerns for increased swelling or infection.  We will recheck your site back here in the office in 7-10 days to determine next steps.    You may use a hot tub or a pool after your wound check appointment if the incision is completely closed.  There are no other restrictions in arm movement after your wound check appointment.  Your ICD is designed to protect you from life threatening heart rhythms. Because of this, you may receive a shock.   1 shock with no symptoms:  Call the office during business hours. 1 shock with symptoms (chest pain, chest pressure, dizziness, lightheadedness, shortness of breath, overall feeling unwell):  Call 911. If you experience 2 or more shocks in 24 hours:  Call 911. If you receive a shock, you should not drive.  [place] DMV - no driving for 6 months if you receive appropriate therapy from your ICD.   ICD Alerts:  Some alerts are vibratory and others beep. These are NOT emergencies. Please call our office to let us know. If this occurs at night or on weekends, it can wait until the next business day. Send a remote transmission.  If your device is capable of reading fluid status (for heart failure), you will be offered monthly monitoring to review this with you.   Remote monitoring is used to monitor your ICD from home. This monitoring is scheduled  every 91 days by our office. It allows [place] to keep an eye on the functioning of your device to ensure it is working properly. You will routinely see your Electrophysiologist annually (more often if necessary).

## 2022-11-21 ENCOUNTER — Encounter: Payer: Self-pay | Admitting: Orthopedic Surgery

## 2022-11-21 NOTE — Progress Notes (Signed)
Office Visit Note   Patient: [name]           Date of Birth: [birth year]           MRN: [id number] Visit Date: 11/19/2022 Requested by: Georgina Quint, MD [id number] [place],  [place] [address] PCP: Georgina Quint, MD  Subjective: Chief Complaint  Patient presents with   Right Hip - Follow-up    HPI: [name] is a 77 y.o. female who presents to the office reporting improvement in right hip pain.  She had an injection 09/09/1972.  Decision today was for or against repeat aspiration and injection.  Overall she is able to walk and she does report a little bit of increased pain in the hip but nothing to the degree that it requires intervention.  She is concerned about whether or not this will ever "heal"..                ROS: All systems reviewed are negative as they relate to the chief complaint within the history of present illness.  Patient denies fevers or chills.  Assessment & Plan: Visit Diagnoses:  1. Effusion of right hip     Plan: On exam she has fairly normal gait slightly antalgic to the right.  A little bit of pain with internal/external rotation of the right hip but not on the left.  No nerve root tension signs and very good hip flexion abduction abduction strength.  Plan at this time is 43-month return for clinical recheck and decision for or against repeat aspiration and injection.  Patient did have a hip effusion.  She likely has some occult arthritis even though it is not really too bad appearing on plain radiographs.  Follow-up in 4 months for clinical recheck  Follow-Up Instructions: No follow-ups on file.   Orders:  No orders of the defined types were placed in this encounter.  No orders of the defined types were placed in this encounter.     Procedures: No procedures performed   Clinical Data: No additional findings.  Objective: Vital Signs: There were no vitals taken for this visit.  Physical Exam:  Constitutional: Patient  appears well-developed HEENT:  Head: Normocephalic Eyes:EOM are normal Neck: Normal range of motion Cardiovascular: Normal rate Pulmonary/chest: Effort normal Neurologic: Patient is alert Skin: Skin is warm Psychiatric: Patient has normal mood and affect  Ortho Exam: Ortho exam demonstrates slight pain with internal/external Tatian of the right hip.  No nerve root tension signs.  Ankle dorsiflexion plantarflexion strength is intact.  No trochanteric tenderness is noted.  Gait is slightly antalgic to the right  Specialty Comments:  No specialty comments available.  Imaging: No results found.   PMFS History: Patient Active Problem List   Diagnosis Date Noted   Right inguinal pain 08/20/2022   Right hip pain 08/20/2022   Recent urinary tract infection 05/08/2022   Chronic bilateral low back pain without sciatica 05/08/2022   Spinal stenosis of lumbar region without neurogenic claudication 05/08/2022   HFrEF (heart failure with reduced ejection fraction) (HCC) 12/02/2021   Stage 3b chronic kidney disease (HCC) 08/08/2021   Chronic insomnia 11/14/2019   Colon polyp 06/16/2018   Thoracic aortic aneurysm without rupture (HCC) 03/31/2018   Chronic renal insufficiency    AICD (automatic cardioverter/defibrillator) present    Ischemic cardiomyopathy    COPD (chronic obstructive pulmonary disease) (HCC)    Hyperlipemia    Insomnia    Pre-diabetes 06/19/2017   Gastric polyp  Osteoporosis 05/11/2013   Hearing loss 04/29/2013   Mild cognitive impairment 04/29/2013   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   Implantable cardioverter-defibrillator (ICD) in situ 08/06/2011   DDD (degenerative disc disease), lumbar 05/16/2011   History of pulmonary embolus (PE) 01/02/2011   UNSPECIFIED ANEMIA 07/03/2010   Essential hypertension 09/05/2009   DIVERTICULOSIS-COLON 08/14/2009   PERSONAL HX COLONIC POLYPS 04/24/2009   TOBACCO ABUSE 04/04/2009   Coronary artery disease involving  native coronary artery of native heart with angina pectoris (HCC) 02/25/2009   GERD 10/19/2007   IBS 10/19/2007   COLONIC POLYPS, ADENOMATOUS 05/12/2007   Past Medical History:  Diagnosis Date   Abdominal pain, left lower quadrant 08/14/2009   Qualifier: Diagnosis of  By: Dorian Pod, Pam     Abnormal CT scan, stomach    Abnormal LFTs 06/19/2017   AICD (automatic cardioverter/defibrillator) present    Dr.Allred follows   Aortic arch pseudoaneurysm (HCC)    a. followed by Dr. Donata Clay.   Arthritis    Asthma    Benign neoplasm of colon    CAD (coronary artery disease) 02/2009   a. anterior STEMI rx with BMS to prox LAD in 02/2009. b. ISR s/p PTCA 06/2009. c. ISR s/p thrombectomy & PTCA 03/2010 due to late stent thrombosis. // Myoview 04/2019: EF 28, ant, ant-sept, inf-sept scar, no ischemia, high risk (stable>>cont med Rx)    Cardiomyopathy, ischemic 06/12/2011   Chronic renal insufficiency    stage 3   Chronic systolic CHF (congestive heart failure) (HCC)    a. s/p ST. Jude ICD 2011/12/20.   Chronic systolic heart failure (HCC) 08/05/2011   Colon polyp    Complication of anesthesia    COPD (chronic obstructive pulmonary disease) (HCC)    CORONARY ARTERY DISEASE 04/24/2009   Qualifier: Diagnosis of  By: Myrtie Hawk, Amy S    DDD (degenerative disc disease), lumbar 05/16/2011   Depression    "since my son died in 2022-11-20" 12/19/2017)   Diverticulosis    DIVERTICULOSIS-COLON 08/14/2009   Qualifier: Diagnosis of  By: Monica Becton PA-c, Amy S    DYSPHAGIA UNSPECIFIED 04/24/2009   Qualifier: Diagnosis of  By: Dorian Pod, Pam     Dyspnea    "when I lay down at night"    Essential hypertension 09/05/2009   Qualifier: Diagnosis of  By: Yancey Flemings CMA, Jennifer     Gastric polyp    GERD 10/19/2007   Qualifier: Diagnosis of  By: Luberta Robertson RN, Eli Phillips: Diagnosis of  By: Dorian Pod, Pam     GERD (gastroesophageal reflux disease)    GI bleed    a. h/o GIB on DAPT, now on ASA only.   Headache  01/03/2013   Hearing loss    left ear   History of pulmonary embolus (PE) 01/02/2011   Hyperlipemia    Hyperlipidemia, unspecified 04/24/2009   Qualifier: Diagnosis of  By: Monica Becton PA-c, Amy S    Hypertension    ICD-St.Jude 08/06/2011   S/p St. Jude ICD placement 08/05/11    Insomnia    Irritable bowel syndrome    Ischemic cardiomyopathy    EF 10-15%   Memory loss    Migraine headache without aura    Myocardial infarct (HCC) 2011 x 2   Dr. 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   Pulmonary embolism (HCC) 2011   Stroke [facility])    'When I was young." no residual   Thoracic aortic aneurysm (HCC) 03/31/2018   TOBACCO ABUSE 04/04/2009   Qualifier: Diagnosis of  By: Jolene Provost  Qualifier: Diagnosis of  By: Riley Kill, MD, Johny Sax    Transfusion history    ?'12 or '13   UNSPECIFIED ANEMIA 07/03/2010   Qualifier: Diagnosis of  By: Omer Jack     Unspecified mastoiditis     Family History  Problem Relation Age of Onset   Colon cancer Mother    Other Brother        tumor in his brain   Bladder Cancer Brother    Throat cancer Brother    Cancer Son        kidney, spread to his liver   Breast cancer Cousin     Past Surgical History:  Procedure Laterality Date   ANGIOPLASTY  07/02/09, 04/01/10   BILATERAL SALPINGOOPHORECTOMY     CAD( bare metal stent)  02/2009   x 1   CAROTID-SUBCLAVIAN BYPASS GRAFT Left 03/31/2018   Procedure: LEFT SUBCLAVIAN ARTERY BYPASS GRAFT;  Surgeon: Nada Libman, MD;  Location: [facility] OR;  Service: Vascular;  Laterality: Left;   CERVICAL SPINE SURGERY  08/08   COLONOSCOPY WITH PROPOFOL N/A 04/29/2016   Procedure: COLONOSCOPY WITH PROPOFOL;  Surgeon: Ruffin Frederick, MD;  Location: [facility] ENDOSCOPY;  Service: Gastroenterology;  Laterality: N/A;   ESOPHAGOGASTRODUODENOSCOPY N/A 12/09/2016   Procedure: ESOPHAGOGASTRODUODENOSCOPY (EGD);  Surgeon: Ruffin Frederick, MD;  Location: [facility];  Service: Gastroenterology;  Laterality: N/A;   ESOPHAGOGASTRODUODENOSCOPY (EGD) WITH PROPOFOL N/A 04/29/2016   Procedure: ESOPHAGOGASTRODUODENOSCOPY (EGD) WITH PROPOFOL;  Surgeon: Ruffin Frederick, MD;  Location: [facility] ENDOS[facility];  Service: Gastroenterology;  Laterality: N/A;   EUS N/A 05/15/2016   Procedure: UPPER ENDOSCOPIC ULTRASOUND (EUS) RADIAL;  Surgeon: Rachael Fee, MD;  Location: [facility] ENDOSCOPY;  Service: Endoscopy;  Laterality: N/A;   ICD GENERATOR CHANGEOUT N/A 11/04/2022   Procedure: ICD GENERATOR CHANGEOUT;  Surgeon: Nelly Laurence, Roberts Gaudy, MD;  Location: [facility] INVASIVE CV LAB;  Service: Cardiovascular;  Laterality: N/A;   IMPLANTABLE CARDIOVERTER DEFIBRILLATOR IMPLANT N/A 08/05/2011   Primary prevention SJM ICD implanted,  Analyze ST study patient   INNER EAR SURGERY     left x 17   LUMBAR DISC SURGERY  02/2008   fusion   NASAL SEPTUM SURGERY     RIGHT HEART CATH N/A 12/11/2021   Procedure: RIGHT HEART CATH;  Surgeon: Tonny Bollman, MD;  Location: [facility] INVASIVE CV LAB;  Service: Cardiovascular;  Laterality: N/A;   THORACIC AORTIC ENDOVASCULAR STENT GRAFT N/A 03/31/2018   Procedure: THORACIC AORTIC ENDOVASCULAR STENT GRAFT;  Surgeon: Nada Libman, MD;  Location: [facility] OR;  Service: Vascular;  Laterality: N/A;   TOTAL ABDOMINAL HYSTERECTOMY     complete   Social History   Occupational History   Occupation: Designer, industrial/product: UNEMPLOYED    Employer: DISABLED  Tobacco Use   Smoking status: Former    Packs/day: 0.13    Years: 30.00    Additional pack years: 0.00    Total pack years: 3.90    Types: Cigarettes   Smokeless tobacco: Never  Vaping Use   Vaping Use: Never used  Substance and Sexual Activity   Alcohol use: No    Alcohol/week:  0.0 standard drinks  of alcohol   Drug use: No   Sexual activity: Not on file

## 2022-11-24 ENCOUNTER — Ambulatory Visit (INDEPENDENT_AMBULATORY_CARE_PROVIDER_SITE_OTHER): Payer: 59 | Admitting: Emergency Medicine

## 2022-11-24 ENCOUNTER — Encounter: Payer: Self-pay | Admitting: Emergency Medicine

## 2022-11-24 VITALS — BP 136/72 | HR 76 | Temp 98.5°F | Ht 62.0 in | Wt 163.0 lb

## 2022-11-24 DIAGNOSIS — N1832 Chronic kidney disease, stage 3b: Secondary | ICD-10-CM

## 2022-11-24 DIAGNOSIS — J449 Chronic obstructive pulmonary disease, unspecified: Secondary | ICD-10-CM

## 2022-11-24 DIAGNOSIS — R102 Pelvic and perineal pain unspecified side: Secondary | ICD-10-CM

## 2022-11-24 DIAGNOSIS — I25119 Atherosclerotic heart disease of native coronary artery with unspecified angina pectoris: Secondary | ICD-10-CM

## 2022-11-24 DIAGNOSIS — I5020 Unspecified systolic (congestive) heart failure: Secondary | ICD-10-CM | POA: Diagnosis not present

## 2022-11-24 DIAGNOSIS — E7800 Pure hypercholesterolemia, unspecified: Secondary | ICD-10-CM

## 2022-11-24 DIAGNOSIS — I10 Essential (primary) hypertension: Secondary | ICD-10-CM | POA: Diagnosis not present

## 2022-11-24 DIAGNOSIS — K219 Gastro-esophageal reflux disease without esophagitis: Secondary | ICD-10-CM

## 2022-11-24 LAB — COMPREHENSIVE METABOLIC PANEL
ALT: 14 U/L (ref 0–35)
AST: 18 U/L (ref 0–37)
Albumin: 4.1 g/dL (ref 3.5–5.2)
Alkaline Phosphatase: 75 U/L (ref 39–117)
BUN: 23 mg/dL (ref 6–23)
CO2: 22 mEq/L (ref 19–32)
Calcium: 9.4 mg/dL (ref 8.4–10.5)
Chloride: 110 mEq/L (ref 96–112)
Creatinine, Ser: 1.86 mg/dL — ABNORMAL HIGH (ref 0.40–1.20)
GFR: 26.21 mL/min — ABNORMAL LOW (ref >60.00)
Glucose, Bld: 126 mg/dL — ABNORMAL HIGH (ref 70–99)
Potassium: 4.3 mEq/L (ref 3.5–5.1)
Sodium: 140 mEq/L (ref 135–145)
Total Bilirubin: 0.4 mg/dL (ref 0.2–1.2)
Total Protein: 7.1 g/dL (ref 6.0–8.3)

## 2022-11-24 LAB — CBC WITH DIFFERENTIAL/PLATELET
Basophils Absolute: 0 10*3/uL (ref 0.0–0.1)
Basophils Relative: 0.5 % (ref 0.0–3.0)
Eosinophils Absolute: 0.1 10*3/uL (ref 0.0–0.7)
Eosinophils Relative: 1.1 % (ref 0.0–5.0)
HCT: 36.7 % (ref 36.0–46.0)
Hemoglobin: 12.4 g/dL (ref 12.0–15.0)
Lymphocytes Relative: 44.4 % (ref 12.0–46.0)
Lymphs Abs: 4.2 10*3/uL — ABNORMAL HIGH (ref 0.7–4.0)
MCHC: 33.7 g/dL (ref 30.0–36.0)
MCV: 101.8 fl — ABNORMAL HIGH (ref 78.0–100.0)
Monocytes Absolute: 0.5 10*3/uL (ref 0.1–1.0)
Monocytes Relative: 5.2 % (ref 3.0–12.0)
Neutro Abs: 4.6 10*3/uL (ref 1.4–7.7)
Neutrophils Relative %: 48.8 % (ref 43.0–77.0)
Platelets: 257 10*3/uL (ref 150.0–400.0)
RBC: 3.61 Mil/uL — ABNORMAL LOW (ref 3.87–5.11)
RDW: 15.2 % (ref 11.5–15.5)
WBC: 9.3 10*3/uL (ref 4.0–10.5)

## 2022-11-24 MED ORDER — TOPIRAMATE 50 MG PO TABS: 50.0000 mg | ORAL_TABLET | Freq: Every day | ORAL | 3 refills | Status: DC

## 2022-11-24 NOTE — Assessment & Plan Note (Signed)
Well controlled on Dexilant 60mg daily  

## 2022-11-24 NOTE — Patient Instructions (Signed)
Pelvic Pain, Female Pelvic pain is pain in your lower belly (abdomen), below your belly button and between your hips. The pain may: Start all of a sudden (be acute). Keep coming back (be recurring). Last a long time (become chronic). Pelvic pain that lasts longer than 6 months is called chronic pelvic pain. There are many causes of pelvic pain. Sometimes the cause of pelvic pain is not known. Follow these instructions at home:  Take over-the-counter and prescription medicines only as told by your doctor. Rest as told by your doctor. Do not have sex if it hurts. Keep a journal of your pelvic pain. Write down: When the pain started. Where the pain is located. What seems to make the pain better or worse, such as food or your monthly period (menstrual cycle). Any symptoms you have along with the pain. Keep all follow-up visits. Contact a doctor if: Medicine does not help your pain, or your pain comes back. You have new symptoms. You have unusual discharge or bleeding from your vagina. You have a fever or chills. You are having trouble pooping (constipation). You have blood in your pee (urine) or poop (stool). Your pee smells bad. You feel weak or light-headed. Get help right away if: You have sudden pain that is very bad. You have very bad pain and also have any of these symptoms: A fever. Feeling like you may vomit (nauseous). Vomiting. Being very sweaty. You faint. These symptoms may be an emergency. Get help right away. Call your local emergency services (911 in the [place] Do not wait to see if the symptoms will go away. Do not drive yourself to the hospital. Summary Pelvic pain is pain in your lower belly (abdomen), below your belly button and between your hips. There are many causes of pelvic pain. Keep a journal of your pelvic pain. This information is not intended to replace advice given to you by your health care provider. Make sure you discuss any questions you have with  your health care provider. Document Revised: 11/20/2020 Document Reviewed: 11/20/2020 Elsevier Patient Education  2023 [employer]   

## 2022-11-24 NOTE — Assessment & Plan Note (Signed)
Stable.  No recent anginal episodes.  Continues daily baby aspirin

## 2022-11-24 NOTE — Assessment & Plan Note (Signed)
Clinically stable. Benign examination Needs gynecological exam.  Referral placed today. Pain management discussed Urinalysis and urine culture done today.  Has some lower urinary tract symptoms.  Need to rule out infection.

## 2022-11-24 NOTE — Assessment & Plan Note (Signed)
BP Readings from Last 3 Encounters:  11/24/22 136/72  11/04/22 (!) 149/84  09/25/22 124/72  Well-controlled hypertension.  Continue bisoprolol 5 mg daily and hydralazine 10 mg 3 times a day

## 2022-11-24 NOTE — Assessment & Plan Note (Signed)
Stable condition Continue rosuvastatin 20 mg daily

## 2022-11-24 NOTE — Assessment & Plan Note (Signed)
Clinically stable.  No signs of acute congestive heart failure Continues daily Lasix 40 mg along with bisoprolol 5 mg Clinically euvolemic

## 2022-11-24 NOTE — Progress Notes (Signed)
[name] 77 y.o.   Chief Complaint  Patient presents with   Medical Management of Chronic Issues    f/u appt, patient states she was seen by the urologist and prescribed 2 creams and they are not working. She wants to get her kidney function checked.     HISTORY OF PRESENT ILLNESS: This is a 77 y.o. female complaining of pelvic pain for the past couple weeks States she went to see urologist and was given vaginal cream for "endometrial" thinning Cream not working.  Has some urinary symptoms with frequency and urgency Able to eat and drink.  Denies nausea or vomiting Denies fever or chills. Denies abdominal or flank pain No other complaints or medical concerns today.  HPI   Prior to Admission medications   Medication Sig Start Date End Date Taking? Authorizing Provider  acetaminophen (TYLENOL) 500 MG tablet Take 2 tablets (1,000 mg total) by mouth every 6 (six) hours as needed (back pain.). 08/11/22  Yes Redwine, Madison A, PA-C  amitriptyline (ELAVIL) 50 MG tablet TAKE 1 TABLET (50 MG TOTAL) BY MOUTH AT BEDTIME. 08/20/22  Yes Porcia Morganti, Eilleen Kempf, MD  aspirin 81 MG EC tablet Take 81 mg by mouth at bedtime.   Yes [provider]  bisoprolol (ZEBETA) 5 MG tablet TAKE 1 TABLET BY MOUTH DAILY. Patient taking differently: Take 5 mg by mouth at bedtime. 06/18/22  Yes Tonny Bollman, MD  dexlansoprazole (DEXILANT) 60 MG capsule TAKE 1 CAPSULE (60 MG TOTAL) BY MOUTH DAILY. **PLEASE CALL OFFICE TO SCHEDULE FOLLOW UP 06/09/22  Yes Esterwood, Amy S, PA-C  furosemide (LASIX) 40 MG tablet Take 1 tablet (40 mg total) by mouth daily. Patient taking differently: Take 40 mg by mouth at bedtime. 12/09/21  Yes Weaver, Scott T, PA-C  hydrALAZINE (APRESOLINE) 10 MG tablet TAKE 1 TABLET (10 MG TOTAL) BY MOUTH 3 (THREE) TIMES DAILY. 09/12/22 09/12/23 Yes Weaver, Scott T, PA-C  meloxicam (MOBIC) 15 MG tablet Take 1 tablet (15 mg total) by mouth daily. 08/20/22  Yes Mirha Brucato, Eilleen Kempf,  MD  polyethylene glycol powder (GLYCOLAX/MIRALAX) powder Take 17 g by mouth daily as needed for mild constipation. 07/08/17  Yes Lezlie Lye, Meda Coffee, MD  PREMARIN vaginal cream Place 0.5 g vaginally daily as needed (irritation.). 10/03/22  Yes [provider]  rosuvastatin (CRESTOR) 20 MG tablet TAKE 1 TABLET (20 MG TOTAL) BY MOUTH AT BEDTIME. PLEASE MAKE YEARLY APPT WITH DR. Excell Seltzer FOR OCTOBER 2022 FOR FUTURE REFILLS. 02/18/22  Yes Weaver, Scott T, PA-C  Tiotropium Bromide-Olodaterol (STIOLTO RESPIMAT) 2.5-2.5 MCG/ACT AERS Inhale 2 puffs into the lungs daily. 01/20/22  Yes Hunsucker, Lesia Sago, MD  traMADol (ULTRAM) 50 MG tablet Take 1 tablet (50 mg total) by mouth daily as needed. 09/10/22 09/10/23 Yes Magnant, Charles L, PA-C  topiramate (TOPAMAX) 50 MG tablet Take 1 tablet (50 mg total) by mouth daily. 11/24/22   Georgina Quint, MD    Allergies  Allergen Reactions   Sulfa Antibiotics Other (See Comments)    Unknown, childhood allergy    Sulfonamide Derivatives Other (See Comments)    UNSURE   Zestril [Lisinopril] Cough    Patient Active Problem List   Diagnosis Date Noted   Chronic bilateral low back pain without sciatica 05/08/2022   Spinal stenosis of lumbar region without neurogenic claudication 05/08/2022   HFrEF (heart failure with reduced ejection fraction) (HCC) 12/02/2021   Stage 3b chronic kidney disease (HCC) 08/08/2021   Chronic insomnia 11/14/2019   Colon polyp 06/16/2018  Thoracic aortic aneurysm without rupture (HCC) 03/31/2018   Chronic renal insufficiency    AICD (automatic cardioverter/defibrillator) present    Ischemic cardiomyopathy    COPD (chronic obstructive pulmonary disease) (HCC)    Hyperlipemia    Insomnia    Pre-diabetes 06/19/2017   Gastric polyp    Osteoporosis 05/11/2013   Hearing loss 04/29/2013   Mild cognitive impairment 04/29/2013   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   Implantable cardioverter-defibrillator (ICD) in  situ 08/06/2011   DDD (degenerative disc disease), lumbar 05/16/2011   History of pulmonary embolus (PE) 01/02/2011   UNSPECIFIED ANEMIA 07/03/2010   Essential hypertension 09/05/2009   DIVERTICULOSIS-COLON 08/14/2009   PERSONAL HX COLONIC POLYPS 04/24/2009   TOBACCO ABUSE 04/04/2009   Coronary artery disease involving native coronary artery of native heart with angina pectoris (HCC) 02/25/2009   GERD 10/19/2007   IBS 10/19/2007   COLONIC POLYPS, ADENOMATOUS 05/12/2007    Past Medical History:  Diagnosis Date   Abdominal pain, left lower quadrant 08/14/2009   Qualifier: Diagnosis of  By: Dorian Pod, Pam     Abnormal CT scan, stomach    Abnormal LFTs 06/19/2017   AICD (automatic cardioverter/defibrillator) present    Dr.Allred follows   Aortic arch pseudoaneurysm (HCC)    a. followed by Dr. Donata Clay.   Arthritis    Asthma    Benign neoplasm of colon    CAD (coronary artery disease) 02/2009   a. anterior STEMI rx with BMS to prox LAD in 02/2009. b. ISR s/p PTCA 06/2009. c. ISR s/p thrombectomy & PTCA 03/2010 due to late stent thrombosis. // Myoview 04/2019: EF 28, ant, ant-sept, inf-sept scar, no ischemia, high risk (stable>>cont med Rx)    Cardiomyopathy, ischemic 06/12/2011   Chronic renal insufficiency    stage 3   Chronic systolic CHF (congestive heart failure) (HCC)    a. s/p ST. Jude ICD 2011-12-23.   Chronic systolic heart failure (HCC) 08/05/2011   Colon polyp    Complication of anesthesia    COPD (chronic obstructive pulmonary disease) (HCC)    CORONARY ARTERY DISEASE 04/24/2009   Qualifier: Diagnosis of  By: Myrtie Hawk, Amy S    DDD (degenerative disc disease), lumbar 05/16/2011   Depression    "since my son died in 2022/11/23" 12-22-17)   Diverticulosis    DIVERTICULOSIS-COLON 08/14/2009   Qualifier: Diagnosis of  By: Monica Becton PA-c, Amy S    DYSPHAGIA UNSPECIFIED 04/24/2009   Qualifier: Diagnosis of  By: Dorian Pod, Pam     Dyspnea    "when I lay down at night"     Essential hypertension 09/05/2009   Qualifier: Diagnosis of  By: Yancey Flemings CMA, Jennifer     Gastric polyp    GERD 10/19/2007   Qualifier: Diagnosis of  By: Luberta Robertson RN, Eli Phillips: Diagnosis of  By: Dorian Pod, Pam     GERD (gastroesophageal reflux disease)    GI bleed    a. h/o GIB on DAPT, now on ASA only.   Headache 01/03/2013   Hearing loss    left ear   History of pulmonary embolus (PE) 01/02/2011   Hyperlipemia    Hyperlipidemia, unspecified 04/24/2009   Qualifier: Diagnosis of  By: Monica Becton PA-c, Amy S    Hypertension    ICD-St.Jude 08/06/2011   S/p St. Jude ICD placement 08/05/11    Insomnia    Irritable bowel syndrome    Ischemic cardiomyopathy    EF 10-15%   Memory loss    Migraine  headache without aura    Myocardial infarct [facility]) 2011 x 2   Dr. 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   Pulmonary embolism (HCC) 2011   Stroke [facility])    'When I was young." no residual   Thoracic aortic aneurysm (HCC) 03/31/2018   TOBACCO ABUSE 04/04/2009   Qualifier: Diagnosis of  By: Jolene Provost  Qualifier: Diagnosis of  By: Riley Kill, MD, Johny Sax    Transfusion history    ?'12 or '13   UNSPECIFIED ANEMIA 07/03/2010   Qualifier: Diagnosis of  By: Omer Jack     Unspecified mastoiditis     Past Surgical History:  Procedure Laterality Date   ANGIOPLASTY  07/02/09, 04/01/10   BILATERAL SALPINGOOPHORECTOMY     CAD( bare metal stent)  02/2009   x 1   CAROTID-SUBCLAVIAN BYPASS GRAFT Left 03/31/2018   Procedure: LEFT SUBCLAVIAN ARTERY BYPASS GRAFT;  Surgeon: Nada Libman, MD;  Location: [facility] OR;  Service: Vascular;  Laterality: Left;   CERVICAL SPINE SURGERY  08/08   COLONOSCOPY WITH PROPOFOL  N/A 04/29/2016   Procedure: COLONOSCOPY WITH PROPOFOL;  Surgeon: Ruffin Frederick, MD;  Location: [facility] ENDOSCOPY;  Service: Gastroenterology;  Laterality: N/A;   ESOPHAGOGASTRODUODENOSCOPY N/A 12/09/2016   Procedure: ESOPHAGOGASTRODUODENOSCOPY (EGD);  Surgeon: Ruffin Frederick, MD;  Location: [facility];  Service: Gastroenterology;  Laterality: N/A;   ESOPHAGOGASTRODUODENOSCOPY (EGD) WITH PROPOFOL N/A 04/29/2016   Procedure: ESOPHAGOGASTRODUODENOSCOPY (EGD) WITH PROPOFOL;  Surgeon: Ruffin Frederick, MD;  Location: [facility] ENDOSCOPY;  Service: Gastroenterology;  Laterality: N/A;   EUS N/A 05/15/2016   Procedure: UPPER ENDOSCOPIC ULTRASOUND (EUS) RADIAL;  Surgeon: Rachael Fee, MD;  Location: [facility] ENDOSCOPY;  Service: Endoscopy;  Laterality: N/A;   ICD GENERATOR CHANGEOUT N/A 11/04/2022   Procedure: ICD GENERATOR CHANGEOUT;  Surgeon: Nelly Laurence, Roberts Gaudy, MD;  Location: [facility] INVASIVE CV LAB;  Service: Cardiovascular;  Laterality: N/A;   IMPLANTABLE CARDIOVERTER DEFIBRILLATOR IMPLANT N/A 08/05/2011   Primary prevention SJM ICD implanted,  Analyze ST study patient   INNER EAR SURGERY     left x 17   LUMBAR DISC SURGERY  02/2008   fusion   NASAL SEPTUM SURGERY     RIGHT HEART CATH N/A 12/11/2021   Procedure: RIGHT HEART CATH;  Surgeon: Tonny Bollman, MD;  Location: [facility] INVASIVE CV LAB;  Service: Cardiovascular;  Laterality: N/A;   THORACIC AORTIC ENDOVASCULAR STENT GRAFT N/A 03/31/2018   Procedure: THORACIC AORTIC ENDOVASCULAR STENT GRAFT;  Surgeon: Nada Libman, MD;  Location: [facility] OR;  Service: Vascular;  Laterality: N/A;   TOTAL ABDOMINAL HYSTERECTOMY     complete    Social History   Socioeconomic History   Marital status: Divorced    Spouse name: Not on file   Number of children: 2   Years of education: 11   Highest education level: 12th grade  Occupational History   Occupation: Designer, industrial/product: UNEMPLOYED    Employer: DISABLED  Tobacco Use   Smoking status: Former     Packs/day: 0.13    Years: 30.00    Additional pack years: 0.00    Total pack years: 3.90    Types: Cigarettes   Smokeless tobacco: Never  Vaping  Use   Vaping Use: Never used  Substance and Sexual Activity   Alcohol use: No    Alcohol/week: 0.0 standard drinks of alcohol   Drug use: No   Sexual activity: Not on file  Other Topics Concern   Not on file  Social History Narrative   Pt lives in [place] alone.  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   Patient has 11 th grade education.Right handed.   Caffeine- one cup daily         Social Determinants of Health   Financial Resource Strain: Medium Risk (11/20/2022)   Overall Financial Resource Strain (CARDIA)    Difficulty of Paying Living Expenses: Somewhat hard  Food Insecurity: No Food Insecurity (11/20/2022)   Hunger Vital Sign    Worried About Running Out of Food in the Last Year: Never true    Ran Out of Food in the Last Year: Never true  Transportation Needs: No Transportation Needs (11/20/2022)   PRAPARE - Administrator, Civil Service (Medical): No    Lack of Transportation (Non-Medical): No  Physical Activity: Unknown (11/20/2022)   Exercise Vital Sign    Days of Exercise per Week: Patient declined    Minutes of Exercise per Session: 0 min  Stress: Stress Concern Present (11/20/2022)   [employer] of Occupational Health - Occupational Stress Questionnaire    Feeling of Stress : To some extent  Social Connections: Moderately Isolated (11/20/2022)   Social Connection and Isolation Panel [NHANES]    Frequency of Communication with Friends and Family: More than three times a week    Frequency of Social Gatherings with Friends and Family: Patient declined    Attends Religious Services: More than 4 times per year    Active Member of [employer] or Organizations: No    Attends Banker Meetings: Never    Marital Status: Divorced  Catering manager Violence: Not At Risk (12/13/2021)    Humiliation, Afraid, Rape, and Kick questionnaire    Fear of Current or Ex-Partner: No    Emotionally Abused: No    Physically Abused: No    Sexually Abused: No    Family History  Problem Relation Age of Onset   Colon cancer Mother    Other Brother        tumor in his brain   Bladder Cancer Brother    Throat cancer Brother    Cancer Son        kidney, spread to his liver   Breast cancer Cousin      Review of Systems  Constitutional: Negative.  Negative for chills and fever.  HENT: Negative.  Negative for congestion and sore throat.   Respiratory: Negative.  Negative for cough and shortness of breath.   Cardiovascular: Negative.  Negative for chest pain and palpitations.  Gastrointestinal:  Negative for abdominal pain, diarrhea, nausea and vomiting.  Genitourinary:  Positive for frequency and urgency.       Pelvic pain  Skin: Negative.  Negative for rash.  Neurological: Negative.  Negative for dizziness and headaches.  All other systems reviewed and are negative.   Vitals:   11/24/22 1523  BP: 136/72  Pulse: 76  Temp: 98.5 F (36.9 C)  SpO2: 98%    Physical Exam Vitals reviewed.  Constitutional:      Appearance: Normal appearance.  HENT:     Head: Normocephalic.  Eyes:     Extraocular Movements: Extraocular movements intact.  Cardiovascular:     Rate and Rhythm:  Normal rate.  Pulmonary:     Effort: Pulmonary effort is normal.  Abdominal:     Palpations: Abdomen is soft.     Tenderness: There is no abdominal tenderness. There is no right CVA tenderness or left CVA tenderness.  Skin:    General: Skin is warm and dry.     Capillary Refill: Capillary refill takes less than 2 seconds.  Neurological:     Mental Status: She is alert and oriented to person, place, and time.  Psychiatric:        Mood and Affect: Mood normal.        Behavior: Behavior normal.      ASSESSMENT & PLAN: A total of 43 minutes was spent with the patient and counseling/coordination  of care regarding preparing for this visit, review of most recent office visit notes, review of multiple chronic medical conditions under management, review of all medications, review of most recent blood work results, differential diagnosis of pelvic pain and need for gynecological examination, prognosis, documentation, and need for follow-up.  Problem List Items Addressed This Visit       Cardiovascular and Mediastinum   Coronary artery disease involving native coronary artery of native heart with angina pectoris (HCC) (Chronic)    Stable.  No recent anginal episodes.  Continues daily baby aspirin      HFrEF (heart failure with reduced ejection fraction) (HCC) (Chronic)    Clinically stable.  No signs of acute congestive heart failure Continues daily Lasix 40 mg along with bisoprolol 5 mg Clinically euvolemic      Essential hypertension    BP Readings from Last 3 Encounters:  11/24/22 136/72  11/04/22 (!) 149/84  09/25/22 124/72  Well-controlled hypertension.  Continue bisoprolol 5 mg daily and hydralazine 10 mg 3 times a day         Respiratory   COPD (chronic obstructive pulmonary disease) (HCC)    Stable.  Continues Stiolto Respimat        Digestive   GERD    Well-controlled on Dexilant 60 mg daily        Genitourinary   Stage 3b chronic kidney disease (HCC)    Advised to stay well-hydrated and avoid NSAIDs        Other   Hyperlipemia    Stable condition Continue rosuvastatin 20 mg daily      Pelvic pain - Primary    Clinically stable. Benign examination Needs gynecological exam.  Referral placed today. Pain management discussed Urinalysis and urine culture done today.  Has some lower urinary tract symptoms.  Need to rule out infection.      Relevant Orders   CBC with Differential/Platelet   Comprehensive metabolic panel   Urine Culture   Urinalysis   Ambulatory referral to Gynecology   Patient Instructions  Pelvic Pain, Female Pelvic pain is  pain in your lower belly (abdomen), below your belly button and between your hips. The pain may: Start all of a sudden (be acute). Keep coming back (be recurring). Last a long time (become chronic). Pelvic pain that lasts longer than 6 months is called chronic pelvic pain. There are many causes of pelvic pain. Sometimes the cause of pelvic pain is not known. Follow these instructions at home:  Take over-the-counter and prescription medicines only as told by your doctor. Rest as told by your doctor. Do not have sex if it hurts. Keep a journal of your pelvic pain. Write down: When the pain started. Where the pain is located. What  seems to make the pain better or worse, such as food or your monthly period (menstrual cycle). Any symptoms you have along with the pain. Keep all follow-up visits. Contact a doctor if: Medicine does not help your pain, or your pain comes back. You have new symptoms. You have unusual discharge or bleeding from your vagina. You have a fever or chills. You are having trouble pooping (constipation). You have blood in your pee (urine) or poop (stool). Your pee smells bad. You feel weak or light-headed. Get help right away if: You have sudden pain that is very bad. You have very bad pain and also have any of these symptoms: A fever. Feeling like you may vomit (nauseous). Vomiting. Being very sweaty. You faint. These symptoms may be an emergency. Get help right away. Call your local emergency services (911 in the [place] Do not wait to see if the symptoms will go away. Do not drive yourself to the hospital. Summary Pelvic pain is pain in your lower belly (abdomen), below your belly button and between your hips. There are many causes of pelvic pain. Keep a journal of your pelvic pain. This information is not intended to replace advice given to you by your health care provider. Make sure you discuss any questions you have with your health care  provider. Document Revised: 11/20/2020 Document Reviewed: 11/20/2020 Elsevier Patient Education  2023 Elsevier Inc.     Edwina Barth, MD [facility]

## 2022-11-24 NOTE — Assessment & Plan Note (Signed)
Advised to stay well-hydrated and avoid NSAIDs. ?

## 2022-11-24 NOTE — Assessment & Plan Note (Signed)
Stable.  Continues Stiolto Respimat

## 2022-11-25 ENCOUNTER — Other Ambulatory Visit: Payer: Self-pay | Admitting: Pulmonary Disease

## 2022-11-25 ENCOUNTER — Other Ambulatory Visit: Payer: Self-pay | Admitting: Emergency Medicine

## 2022-11-25 DIAGNOSIS — N3000 Acute cystitis without hematuria: Secondary | ICD-10-CM

## 2022-11-25 DIAGNOSIS — N184 Chronic kidney disease, stage 4 (severe): Secondary | ICD-10-CM | POA: Insufficient documentation

## 2022-11-25 LAB — URINALYSIS, ROUTINE W REFLEX MICROSCOPIC
Bilirubin Urine: NEGATIVE
Hgb urine dipstick: NEGATIVE
Ketones, ur: NEGATIVE
Nitrite: NEGATIVE
Specific Gravity, Urine: 1.02 (ref 1.000–1.030)
Total Protein, Urine: NEGATIVE
Urine Glucose: NEGATIVE
Urobilinogen, UA: 0.2 (ref 0.0–1.0)
pH: 6 (ref 5.0–8.0)

## 2022-11-25 MED ORDER — CIPROFLOXACIN HCL 500 MG PO TABS
500.0000 mg | ORAL_TABLET | Freq: Two times a day (BID) | ORAL | 0 refills | Status: AC
Start: 2022-11-25 — End: 2022-12-02

## 2022-11-26 LAB — URINE CULTURE

## 2022-11-28 ENCOUNTER — Ambulatory Visit: Payer: 59 | Attending: Cardiology

## 2022-11-28 DIAGNOSIS — Z95810 Presence of automatic (implantable) cardiac defibrillator: Secondary | ICD-10-CM

## 2022-11-28 NOTE — Progress Notes (Signed)
Patient present for 1 week recheck on hematoma in device pocket.  Dr. Lalla Brothers present for wound check.  Area is healing WNL. Small hematoma still present that is healing, no concerns for acute bleeding.  Patient is to resume ASA and continue to monitor for any new signs of swelling and/or infection. She is aware that it may take a few weeks for hematoma to reabsorb by the body.  She has device clinic number if any concerns and is to keep 91 day appointment with implanting physician.

## 2022-12-10 ENCOUNTER — Telehealth: Payer: Self-pay | Admitting: Emergency Medicine

## 2022-12-10 NOTE — Telephone Encounter (Signed)
Called patient to get clarification. Patient was confused about which referral that was placed for her. She was referred to nephrology and Gynecology not neurology

## 2022-12-10 NOTE — Telephone Encounter (Signed)
Patient said she was told she had a referral for neurology, but there is not one in her chart. She would like to know what in happening with that. She would like a call back at [phone].

## 2022-12-15 ENCOUNTER — Telehealth: Payer: Self-pay

## 2022-12-15 NOTE — Telephone Encounter (Signed)
N/A unable to leave a message for patient to call back to schedule Medicare Annual Wellness Visit   Last AWV  12/13/21  Please schedule at anytime with [facility] Advisor on Annual Wellness Schedule if patient calls the office back.    30 Minute appointment

## 2022-12-21 LAB — LAB REPORT - SCANNED
A1c: 5.9
Creatinine, POC: 199.4 mg/dL
EGFR: 25

## 2022-12-29 ENCOUNTER — Other Ambulatory Visit: Payer: Self-pay | Admitting: Cardiovascular Disease

## 2022-12-29 ENCOUNTER — Other Ambulatory Visit: Payer: Self-pay | Admitting: Physician Assistant

## 2022-12-31 NOTE — Progress Notes (Unsigned)
Office Visit    Patient Name: [name] Date of Encounter: 01/01/2023  Primary Care Provider:  Georgina Quint, MD Primary Cardiologist:  Tonny Bollman, MD Primary Electrophysiologist: Hillis Range, MD (Inactive)   Past Medical History    Past Medical History:  Diagnosis Date   Abdominal pain, left lower quadrant 08/14/2009   Qualifier: Diagnosis of  By: Dorian Pod, Pam     Abnormal CT scan, stomach    Abnormal LFTs 06/19/2017   AICD (automatic cardioverter/defibrillator) present    Dr.Allred follows   Aortic arch pseudoaneurysm (HCC)    a. followed by Dr. Donata Clay.   Arthritis    Asthma    Benign neoplasm of colon    CAD (coronary artery disease) 02/2009   a. anterior STEMI rx with BMS to prox LAD in 02/2009. b. ISR s/p PTCA 06/2009. c. ISR s/p thrombectomy & PTCA 03/2010 due to late stent thrombosis. // Myoview 04/2019: EF 28, ant, ant-sept, inf-sept scar, no ischemia, high risk (stable>>cont med Rx)    Cardiomyopathy, ischemic 06/12/2011   Chronic renal insufficiency    stage 3   Chronic systolic CHF (congestive heart failure) (HCC)    a. s/p ST. Jude ICD 07-Jan-2012.   Chronic systolic heart failure (HCC) 08/05/2011   Colon polyp    Complication of anesthesia    COPD (chronic obstructive pulmonary disease) (HCC)    CORONARY ARTERY DISEASE 04/24/2009   Qualifier: Diagnosis of  By: Myrtie Hawk, Amy S    DDD (degenerative disc disease), lumbar 05/16/2011   Depression    "since my son died in 2022-11-07" 2018/01/06)   Diverticulosis    DIVERTICULOSIS-COLON 08/14/2009   Qualifier: Diagnosis of  By: Monica Becton PA-c, Amy S    DYSPHAGIA UNSPECIFIED 04/24/2009   Qualifier: Diagnosis of  By: Dorian Pod, Pam     Dyspnea    "when I lay down at night"    Essential hypertension 09/05/2009   Qualifier: Diagnosis of  By: Yancey Flemings CMA, Jennifer     Gastric polyp    GERD 10/19/2007   Qualifier: Diagnosis of  By: Luberta Robertson RN, Eli Phillips: Diagnosis of  By: Dorian Pod, Pam      GERD (gastroesophageal reflux disease)    GI bleed    a. h/o GIB on DAPT, now on ASA only.   Headache 06-Jan-2013   Hearing loss    left ear   History of pulmonary embolus (PE) 01/02/2011   Hyperlipemia    Hyperlipidemia, unspecified 04/24/2009   Qualifier: Diagnosis of  By: Monica Becton PA-c, Amy S    Hypertension    ICD-St.Jude 08/06/2011   S/p St. Jude ICD placement 08/05/11    Insomnia    Irritable bowel syndrome    Ischemic cardiomyopathy    EF 10-15%   Memory loss    Migraine headache without aura    Myocardial infarct (HCC) 2011 x 2   Dr. Cooper,cardiology    PERSONAL HX COLONIC POLYPS 04/24/2009   Qualifier: Diagnosis of  By: Myrtie Hawk, Amy S November, 2011 colonoscopy demonstrated a sessile cecal polyp and sigmoid polyp    Pneumonia    PONV (postoperative nausea and vomiting)    Pre-diabetes    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   Pulmonary embolism (HCC) 01-06-2010   Stroke [facility])    'When I was young." no residual   Thoracic aortic aneurysm (HCC) 03/31/2018   TOBACCO ABUSE 04/04/2009   Qualifier: Diagnosis of  By: Jolene Provost  Qualifier: Diagnosis of  By: Riley Kill, MD, Johny Sax    Transfusion history    ?'12 or '13   UNSPECIFIED ANEMIA 07/03/2010   Qualifier: Diagnosis of  By: Omer Jack     Unspecified mastoiditis    Past Surgical History:  Procedure Laterality Date   ANGIOPLASTY  07/02/09, 04/01/10   BILATERAL SALPINGOOPHORECTOMY     CAD( bare metal stent)  02/2009   x 1   CAROTID-SUBCLAVIAN BYPASS GRAFT Left 03/31/2018   Procedure: LEFT SUBCLAVIAN ARTERY BYPASS GRAFT;  Surgeon: Nada Libman, MD;  Location: [facility] OR;  Service: Vascular;  Laterality: Left;   CERVICAL SPINE SURGERY  08/08   COLONOSCOPY WITH PROPOFOL N/A 04/29/2016   Procedure: COLONOSCOPY WITH PROPOFOL;  Surgeon: Ruffin Frederick, MD;  Location: [facility] ENDOSCOPY;  Service: Gastroenterology;  Laterality: N/A;   ESOPHAGOGASTRODUODENOSCOPY N/A 12/09/2016   Procedure:  ESOPHAGOGASTRODUODENOSCOPY (EGD);  Surgeon: Ruffin Frederick, MD;  Location: [facility]COPY;  Service: Gastroenterology;  Laterality: N/A;   ESOPHAGOGASTRODUODENOSCOPY (EGD) WITH PROPOFOL N/A 04/29/2016   Procedure: ESOPHAGOGASTRODUODENOSCOPY (EGD) WITH PROPOFOL;  Surgeon: Ruffin Frederick, MD;  Location: [facility] ENDOSCOPY;  Service: Gastroenterology;  Laterality: N/A;   EUS N/A 05/15/2016   Procedure: UPPER ENDOSCOPIC ULTRASOUND (EUS) RADIAL;  Surgeon: Rachael Fee, MD;  Location: [facility] ENDOSCOPY;  Service: Endoscopy;  Laterality: N/A;   ICD GENERATOR CHANGEOUT N/A 11/04/2022   Procedure: ICD GENERATOR CHANGEOUT;  Surgeon: Nelly Laurence, Roberts Gaudy, MD;  Location: [facility] INVASIVE CV LAB;  Service: Cardiovascular;  Laterality: N/A;   IMPLANTABLE CARDIOVERTER DEFIBRILLATOR IMPLANT N/A 08/05/2011   Primary prevention SJM ICD implanted,  Analyze ST study patient   INNER EAR SURGERY     left x 17   LUMBAR DISC SURGERY  02/2008   fusion   NASAL SEPTUM SURGERY     RIGHT HEART CATH N/A 12/11/2021   Procedure: RIGHT HEART CATH;  Surgeon: Tonny Bollman, MD;  Location: [facility] INVAS[facility] CV LAB;  Service: Cardiovascular;  Laterality: N/A;   THORACIC AORTIC ENDOVASCULAR STENT GRAFT N/A 03/31/2018   Procedure: THORACIC AORTIC ENDOVASCULAR STENT GRAFT;  Surgeon: Nada Libman, MD;  Location: [facility] OR;  Service: Vascular;  Laterality: N/A;   TOTAL ABDOMINAL HYSTERECTOMY     complete    Allergies  Allergies  Allergen Reactions   Sulfa Antibiotics Other (See Comments)    Unknown, childhood allergy    Sulfonamide Derivatives Other (See Comments)    UNSURE   Zestril [Lisinopril] Cough     History of Present Illness    [name]  is a 77 year old female with a PMH of CAD s/p anterior MI in 2010 with BMS to proximal LAD multiple PCI's secondary to ISR HFrEF, ICM s/p AICD, pulmonary embolism, COPD, HTN, HLD, CKD, thoracic aortic aneurysm s/p carotid-subclavian transposition and endovascular repair 03/2018, aortic  atherosclerosis who presents today for 52-month follow-up.  [name] has a significant cardiac history dating back to 2010 previous anterior STEMI with BMS to proximal LAD and multiple PCI's due to ISR PTCA 03/2010.  He was previously followed by Dr. Riley Kill and is currently followed by Dr. Excell Seltzer since 2014.  2D echo was completed 2012 with reduced EF and patient was referred to EP for AICD that was implanted 2013.  She underwent a endovascular repair of thoracic aorta which was preceded by left carotid subclavian transposition in 03/2018.  Repeat Myoview in 03/2021 that showed large infarct with no ischemia and was considered high risk.  She also had 2D echo completed  06/2022 showed EF of 25-30% with grade 1 DD and apical anterior septal/apical/lateral akinesis.  Ms. Pick presents today alone for her 65-month follow-up since last being seen in the office patient reports she has been doing well with no new cardiac complaints since her previous visit.  She continues to have shortness of breath with exertion but states this has improved some and is able to do occasional ADLs without any difficulty.  She has stage IV CKD and sees nephrology every 3 months.  She is compliant with her medications and denies any adverse reactions.  Her blood pressure today is controlled at 128/68 and heart rate was 79 bpm.  She is euvolemic on exam and has trace lower extremity edema present.  She recently underwent generator change out for ICD and has some discomfort at the incision site that is resolved with as needed Tylenol and ice.  She watches her salt intake and abstains from excess fluids.  Patient denies chest pain, palpitations, dyspnea, PND, orthopnea, nausea, vomiting, dizziness, syncope, edema, weight gain, or early satiety.  Home Medications    Current Outpatient Medications  Medication Sig Dispense Refill   acetaminophen (TYLENOL) 500 MG tablet Take 2 tablets (1,000 mg total) by mouth every 6 (six) hours as needed  (back pain.). 30 tablet 0   amitriptyline (ELAVIL) 50 MG tablet TAKE 1 TABLET (50 MG TOTAL) BY MOUTH AT BEDTIME. 90 tablet 1   aspirin 81 MG EC tablet Take 81 mg by mouth at bedtime.     bisoprolol (ZEBETA) 5 MG tablet TAKE 1 TABLET BY MOUTH DAILY. 90 tablet 1   dexlansoprazole (DEXILANT) 60 MG capsule TAKE 1 CAPSULE (60 MG TOTAL) BY MOUTH DAILY. **PLEASE CALL OFFICE TO SCHEDULE FOLLOW UP 30 capsule 6   furosemide (LASIX) 40 MG tablet Take 1 tablet (40 mg total) by mouth daily. (Patient taking differently: Take 40 mg by mouth at bedtime.) 90 tablet 3   hydrALAZINE (APRESOLINE) 10 MG tablet TAKE 1 TABLET (10 MG TOTAL) BY MOUTH 3 (THREE) TIMES DAILY. 270 tablet 3   polyethylene glycol powder (GLYCOLAX/MIRALAX) powder Take 17 g by mouth daily as needed for mild constipation. 255 g 11   PREMARIN vaginal cream Place 0.5 g vaginally daily as needed (irritation.).     rosuvastatin (CRESTOR) 20 MG tablet TAKE 1 TABLET (20 MG TOTAL) BY MOUTH AT BEDTIME. 90 tablet 1   Tiotropium Bromide-Olodaterol (STIOLTO RESPIMAT) 2.5-2.5 MCG/ACT AERS INHALE 2 PUFFS INTO THE LUNGS DAILY. 4 g 0   topiramate (TOPAMAX) 50 MG tablet Take 1 tablet (50 mg total) by mouth daily. 90 tablet 3   traMADol (ULTRAM) 50 MG tablet Take 1 tablet (50 mg total) by mouth daily as needed. 20 tablet 0   No current facility-administered medications for this visit.     Review of Systems  Please see the history of present illness.    (+) Left shoulder pain (+) Shortness of breath with exertion  All other systems reviewed and are otherwise negative except as noted above.  Physical Exam    Wt Readings from Last 3 Encounters:  01/01/23 161 lb (73 kg)  11/24/22 163 lb (73.9 kg)  11/04/22 168 lb (76.2 kg)   VS: Vitals:   01/01/23 1110  BP: 128/68  Pulse: 79  SpO2: 97%  ,Body mass index is 29.45 kg/m.  Constitutional:      Appearance: Healthy appearance. Not in distress.  Neck:     Vascular: JVD normal.  Pulmonary:      Effort:  Pulmonary effort is normal.     Breath sounds: No wheezing. No rales. Diminished in the bases Cardiovascular:     Normal rate. Regular rhythm. Normal S1. Normal S2.      Murmurs: There is no murmur.  Edema:    Trace lower extremity edema Abdominal:     Palpations: Abdomen is soft non tender. There is no hepatomegaly.  Skin:    General: Skin is warm and dry.  Neurological:     General: No focal deficit present.     Mental Status: Alert and oriented to person, place and time.     Cranial Nerves: Cranial nerves are intact.  EKG/LABS/ Recent Cardiac Studies    ECG personally reviewed by me today -none completed today  Cardiac Studies & Procedures   CARDIAC CATHETERIZATION  CARDIAC CATHETERIZATION 12/11/2021  Narrative Normal right heart hemodynamics with no pulmonary hypertension  RA mean of 6 RV 28/2, RVEDP 6 PA 24/11 mean 19 Pulmonary capillary wedge pressure mean 10  PA oxygen saturation 62% SVC oxygen saturation 63% Fick cardiac output 4.6 L/min, cardiac index 2.6 L/min   STRESS TESTS  MYOCARDIAL PERFUSION IMAGING 04/17/2021  Narrative   Findings are consistent with prior myocardial infarction. The study is high risk.   No ST deviation was noted.   LV perfusion is abnormal. There is no evidence of ischemia. There is evidence of infarction. Defect 1: There is a medium defect with severe reduction in uptake present in the mid to basal inferior and inferoseptal location(s) that is fixed. There is abnormal wall motion in the defect area. Consistent with infarction. Defect 2: There is a large defect with severe reduction in uptake present in the apical to mid anterior, anteroseptal and apex location(s) that is fixed.  Uptake is absent in the apical anterior, apical septum and apex.   Left ventricular function is abnormal. Nuclear stress EF: 18 %. The left ventricular ejection fraction is severely decreased (<30%).   Prior study available for comparison from 04/28/2019. No  changes compared to prior study.  High risk stress test due to reduced systolic function and extensive prior infarct.  There is no evidence of ischemia.   ECHOCARDIOGRAM  ECHOCARDIOGRAM COMPLETE 05/01/2021  Narrative ECHOCARDIOGRAM REPORT    Patient Name:   TELISA CHASTAIN Date of Exam: 05/01/2021 Medical Rec #:  [id number]        Height:       62.0 in Accession #:    [id number]       Weight:       182.0 lb Date of Birth:  26-Mar-1946        BSA:          1.837 m Patient Age:    75 years         BP:           110/74 mmHg Patient Gender: F                HR:           73 bpm. Exam Location:  [address]  Procedure: 2D Echo, Cardiac Doppler, Color Doppler and Intracardiac Opacification Agent  Indications:    R06.00 Dyspnea  History:        Patient has prior history of Echocardiogram examinations, most recent 06/13/2020. Cardiomyopathy and CHF, CAD, Defibrillator, COPD, Arrythmias:RBBB, Signs/Symptoms:Shortness of Breath; Risk Factors:Pre-diabetes, Hypertension, Dyslipidemia and Current Smoker. Thoracic aortic aneurysm. Anemia.  Sonographer:    Cathie Beams RCS Referring Phys: [phone] Alver Sorrow  IMPRESSIONS  1. Definity contrast does not show any evidence of LV thrombus. . Left ventricular ejection fraction, by estimation, is 25 to 30%. The left ventricle has severely decreased function. The left ventricle demonstrates regional wall motion abnormalities (see scoring diagram/findings for description). The left ventricular internal cavity size was moderately dilated. Left ventricular diastolic parameters are consistent with Grade I diastolic dysfunction (impaired relaxation). There is severe akinesis of the left ventricular, mid-apical anteroseptal wall, apical segment and lateral wall. 2. Right ventricular systolic function is normal. The right ventricular size is normal. 3. Right atrial size was mildly dilated. 4. The mitral valve is normal in structure. No evidence  of mitral valve regurgitation. 5. The aortic valve is normal in structure. Aortic valve regurgitation is not visualized.  FINDINGS Left Ventricle: Definity contrast does not show any evidence of LV thrombus. Left ventricular ejection fraction, by estimation, is 25 to 30%. The left ventricle has severely decreased function. The left ventricle demonstrates regional wall motion abnormalities. Severe akinesis of the left ventricular, mid-apical anteroseptal wall, apical segment and lateral wall. Definity contrast agent was given IV to delineate the left ventricular endocardial borders. The left ventricular internal cavity size was moderately dilated. There is no left ventricular hypertrophy. Left ventricular diastolic parameters are consistent with Grade I diastolic dysfunction (impaired relaxation).  Right Ventricle: The right ventricular size is normal. Right vetricular wall thickness was not well visualized. Right ventricular systolic function is normal.  Left Atrium: Left atrial size was normal in size.  Right Atrium: Right atrial size was mildly dilated.  Pericardium: There is no evidence of pericardial effusion.  Mitral Valve: The mitral valve is normal in structure. No evidence of mitral valve regurgitation.  Tricuspid Valve: The tricuspid valve is grossly normal. Tricuspid valve regurgitation is not demonstrated.  Aortic Valve: The aortic valve is normal in structure. Aortic valve regurgitation is not visualized.  Pulmonic Valve: The pulmonic valve was normal in structure. Pulmonic valve regurgitation is not visualized.  Aorta: The aortic root and ascending aorta are structurally normal, with no evidence of dilitation.  IAS/Shunts: The atrial septum is grossly normal.   LEFT VENTRICLE PLAX 2D LVIDd:         5.60 cm  Diastology LVIDs:         4.80 cm  LV e' medial:    5.03 cm/s LV PW:         1.00 cm  LV E/e' medial:  13.1 LV IVS:        1.00 cm  LV e' lateral:   5.51 cm/s LVOT  diam:     2.00 cm  LV E/e' lateral: 12.0 LV SV:         73 LV SV Index:   40 LVOT Area:     3.14 cm   RIGHT VENTRICLE RV S prime:     11.00 cm/s  LEFT ATRIUM             Index       RIGHT ATRIUM LA diam:        4.00 cm 2.18 cm/m  RA Pressure: 3.00 mmHg LA Vol (A2C):   27.8 ml 15.14 ml/m LA Vol (A4C):   49.5 ml 26.95 ml/m LA Biplane Vol: 38.1 ml 20.75 ml/m AORTIC VALVE LVOT Vmax:   112.00 cm/s LVOT Vmean:  74.900 cm/s LVOT VTI:    0.231 m  AORTA Ao Root diam: 2.60 cm Ao Asc diam:  2.90 cm  MITRAL VALVE  TRICUSPID VALVE Estimated RAP:  3.00 mmHg MV Decel Time: 97 msec MV E velocity: 66.00 cm/s   SHUNTS MV A velocity: 114.00 cm/s  Systemic VTI:  0.23 m MV E/A ratio:  0.58         Systemic Diam: 2.00 cm  Kristeen Miss MD Electronically signed by Kristeen Miss MD Signature Date/Time: 05/01/2021/5:33:16 PM    Final            Lab Results  Component Value Date   WBC 9.3 11/24/2022   HGB 12.4 11/24/2022   HCT 36.7 11/24/2022   MCV 101.8 (H) 11/24/2022   PLT 257.0 11/24/2022   Lab Results  Component Value Date   CREATININE 1.86 (H) 11/24/2022   BUN 23 11/24/2022   NA 140 11/24/2022   K 4.3 11/24/2022   CL 110 11/24/2022   CO2 22 11/24/2022   Lab Results  Component Value Date   ALT 14 11/24/2022   AST 18 11/24/2022   ALKPHOS 75 11/24/2022   BILITOT 0.4 11/24/2022   Lab Results  Component Value Date   CHOL 126 12/06/2021   HDL 51 12/06/2021   LDLCALC 53 12/06/2021   TRIG 122 12/06/2021   CHOLHDL 2.5 12/06/2021    Lab Results  Component Value Date   HGBA1C 6.0 (H) 03/12/2020     Assessment & Plan    1.  Coronary artery disease: -s/p anterior STEMI 2010 with BMS to LAD and multiple PCI's with ISR and most recent ischemic evaluation completed Lexiscan Myoview and 03/2021 -Today patient reports no chest pain or anginal equivalent -Continue GDMT with ASA 81 mg, Zebeta 5 mg, Crestor 20 mg  2.  HFrEF: -Patient had RHC completed  11/2021 with normal right heart pressures and no evidence of pulmonary HTN. -Today patient is euvolemic with trace lower extremity edema present.  She continues to have shortness of breath with exertion but reports improvement since previous visit. -Continue current GDMT with hydralazine 10 mg daily, Lasix 40 mg daily, bisoprolol 5 mg daily  3.  Mixed hyperlipidemia: -Patient's LDL cholesterol was 53 -Continue Crestor 20 mg daily  4.  Essential hypertension: -Patient's blood pressure today was well-controlled at 120/68 -Continue bisoprolol 5 mg daily, hydralazine 10 mg daily  5.  ICM: -s/p Saint Jude PPM placed 2013 -Patient reports some pain at the incision site that is relieved with ice and as needed Tylenol. -She has a follow-up with Dr. Nelly Laurence in July.  Disposition: Follow-up with Tonny Bollman, MD or APP in 6 months    Medication Adjustments/Labs and Tests Ordered: Current medicines are reviewed at length with the patient today.  Concerns regarding medicines are outlined above.   Signed, Napoleon Form, Leodis Rains, NP 01/01/2023, 1:04 PM [facility]

## 2023-01-01 ENCOUNTER — Encounter: Payer: Self-pay | Admitting: Nurse Practitioner

## 2023-01-01 ENCOUNTER — Ambulatory Visit: Payer: 59 | Attending: Nurse Practitioner | Admitting: Nurse Practitioner

## 2023-01-01 VITALS — BP 128/68 | HR 79 | Ht 62.0 in | Wt 161.0 lb

## 2023-01-01 DIAGNOSIS — Z95810 Presence of automatic (implantable) cardiac defibrillator: Secondary | ICD-10-CM | POA: Diagnosis not present

## 2023-01-01 DIAGNOSIS — I5020 Unspecified systolic (congestive) heart failure: Secondary | ICD-10-CM

## 2023-01-01 DIAGNOSIS — I10 Essential (primary) hypertension: Secondary | ICD-10-CM

## 2023-01-01 DIAGNOSIS — I25119 Atherosclerotic heart disease of native coronary artery with unspecified angina pectoris: Secondary | ICD-10-CM

## 2023-01-01 DIAGNOSIS — E7800 Pure hypercholesterolemia, unspecified: Secondary | ICD-10-CM

## 2023-01-01 DIAGNOSIS — I255 Ischemic cardiomyopathy: Secondary | ICD-10-CM

## 2023-01-01 NOTE — Patient Instructions (Signed)
Medication Instructions:  Your physician recommends that you continue on your current medications as directed. Please refer to the Current Medication list given to you today. *If you need a refill on your cardiac medications before your next appointment, please call your pharmacy*   Lab Work: None ordered If you have labs (blood work) drawn today and your tests are completely normal, you will receive your results only by: MyChart Message (if you have MyChart) OR A paper copy in the mail If you have any lab test that is abnormal or we need to change your treatment, we will call you to review the results.   Testing/Procedures: None ordered   Follow-Up: At [facility], you and your health needs are our priority.  As part of our continuing mission to provide you with exceptional heart care, we have created designated Provider Care Teams.  These Care Teams include your primary Cardiologist (physician) and Advanced Practice Providers (APPs -  Physician Assistants and Nurse Practitioners) who all work together to provide you with the care you need, when you need it.  We recommend signing up for the patient portal called "MyChart".  Sign up information is provided on this After Visit Summary.  MyChart is used to connect with patients for Virtual Visits (Telemedicine).  Patients are able to view lab/test results, encounter notes, upcoming appointments, etc.  Non-urgent messages can be sent to your provider as well.   To learn more about what you can do with MyChart, go to [email or web address].    Your next appointment:   6 month(s)  Provider:   Tonny Bollman, MD  or Tereso Newcomer, PA-C   Other Instructions

## 2023-02-05 ENCOUNTER — Ambulatory Visit (INDEPENDENT_AMBULATORY_CARE_PROVIDER_SITE_OTHER): Payer: 59

## 2023-02-05 DIAGNOSIS — I255 Ischemic cardiomyopathy: Secondary | ICD-10-CM | POA: Diagnosis not present

## 2023-02-05 LAB — CUP PACEART REMOTE DEVICE CHECK
Battery Remaining Longevity: 120 mo
Battery Remaining Percentage: 95.5 %
Battery Voltage: 3.07 V
Brady Statistic RV Percent Paced: 1 %
Date Time Interrogation Session: 20240711020418
HighPow Impedance: 81 Ohm
Implantable Lead Connection Status: 753985
Implantable Lead Implant Date: 20130108
Implantable Lead Location: 753860
Implantable Pulse Generator Implant Date: 20240409
Lead Channel Impedance Value: 530 Ohm
Lead Channel Pacing Threshold Amplitude: 0.75 V
Lead Channel Pacing Threshold Pulse Width: 0.5 ms
Lead Channel Sensing Intrinsic Amplitude: 12 mV
Lead Channel Setting Pacing Amplitude: 2.5 V
Lead Channel Setting Pacing Pulse Width: 0.5 ms
Lead Channel Setting Sensing Sensitivity: 0.5 mV
Pulse Gen Serial Number: 211016490
Zone Setting Status: 755011

## 2023-02-13 ENCOUNTER — Other Ambulatory Visit: Payer: Self-pay | Admitting: Emergency Medicine

## 2023-02-13 ENCOUNTER — Other Ambulatory Visit: Payer: Self-pay | Admitting: Pulmonary Disease

## 2023-02-13 ENCOUNTER — Ambulatory Visit: Payer: 59 | Admitting: Cardiovascular Disease

## 2023-02-13 ENCOUNTER — Encounter: Payer: Self-pay | Admitting: Cardiovascular Disease

## 2023-02-13 VITALS — BP 122/80 | HR 73 | Ht 62.0 in | Wt 165.0 lb

## 2023-02-13 DIAGNOSIS — I25119 Atherosclerotic heart disease of native coronary artery with unspecified angina pectoris: Secondary | ICD-10-CM

## 2023-02-13 DIAGNOSIS — G43009 Migraine without aura, not intractable, without status migrainosus: Secondary | ICD-10-CM

## 2023-02-13 NOTE — Progress Notes (Signed)
  Electrophysiology Office Note:    Date:  02/13/2023   ID:  [name], DOB 12-26-1945, MRN [id number]  PCP:  Georgina Quint, MD   [facility] Providers Cardiologist:  Tonny Bollman, MD Electrophysiologist:  Hillis Range, MD (Inactive)     Referring MD: Georgina Quint, *   History of Present Illness:    [name] is a 77 y.o. female with a hx listed below, significant for CHFrEF, RBBB, CAD with ischemic cardiomyopathy who presents for follow-up for device management.  He has an Abbott single-chamber ICD for primary prevention in the setting of ischemic cardiomyopathy with severely reduced EF.  She has never had any shocks.  She has no complaints today other than hip and back pain.  she has no device related complaints -- no new tenderness, drainage, redness.         EKGs/Labs/Other Studies Reviewed Today:     TTE 05/01/21: EF 25-30%  EKG:  Last EKG results: Sinus rhythm, RBBB, QRS 138   Recent Labs: 11/24/2022: ALT 14; BUN 23; Creatinine, Ser 1.86; Hemoglobin 12.4; Platelets 257.0; Potassium 4.3; Sodium 140     Physical Exam:    VS:  BP 122/80   Pulse 73   Ht 5\' 2"  (1.575 m)   Wt 165 lb (74.8 kg)   SpO2 99%   BMI 30.18 kg/m     Wt Readings from Last 3 Encounters:  02/13/23 165 lb (74.8 kg)  01/01/23 161 lb (73 kg)  11/24/22 163 lb (73.9 kg)     GEN: Well nourished, well developed in no acute distress CARDIAC: RRR, no murmurs, rubs, gallops The device site is normal -- no tenderness, edema, drainage, redness, threatened erosion. RESPIRATORY:  Normal work of breathing MUSCULOSKELETAL: no edema    ASSESSMENT & PLAN:    Abbott single chamber ICD at ERI:  Device interrogated today. I reviewed the interrogation in detail No events, normal function  Right bundle branch block:  QRS < 150, so there is not likely to be significant benefit from CRT.  CHFrEF:  Appears well compensated today. On bisoprolol 5 mg,  hydralazine 10 mg.  Dr. Excell Seltzer is her cardiologist.       Medication Adjustments/Labs and Tests Ordered: Current medicines are reviewed at length with the patient today.  Concerns regarding medicines are outlined above.  Orders Placed This Encounter  Procedures   EKG 12-Lead   EKG 12-Lead   No orders of the defined types were placed in this encounter.    Signed, Maurice Small, MD  02/13/2023 2:35 PM    [facility]

## 2023-02-13 NOTE — Patient Instructions (Signed)
Medication Instructions:  Your physician recommends that you continue on your current medications as directed. Please refer to the Current Medication list given to you today. *If you need a refill on your cardiac medications before your next appointment, please call your pharmacy*   Follow-Up: At [facility], you and your health needs are our priority.  As part of our continuing mission to provide you with exceptional heart care, we have created designated Provider Care Teams.  These Care Teams include your primary Cardiologist (physician) and Advanced Practice Providers (APPs -  Physician Assistants and Nurse Practitioners) who all work together to provide you with the care you need, when you need it.  We recommend signing up for the patient portal called "MyChart".  Sign up information is provided on this After Visit Summary.  MyChart is used to connect with patients for Virtual Visits (Telemedicine).  Patients are able to view lab/test results, encounter notes, upcoming appointments, etc.  Non-urgent messages can be sent to your provider as well.   To learn more about what you can do with MyChart, go to [email or web address].    Your next appointment:   1 year(s)  Provider:   You will see one of the following Advanced Practice Providers on your designated Care Team:   Francis Dowse, Charlott Holler [address] [place], [place] Sherie Don, NP Canary Brim, NP

## 2023-02-17 ENCOUNTER — Ambulatory Visit (INDEPENDENT_AMBULATORY_CARE_PROVIDER_SITE_OTHER): Payer: 59

## 2023-02-17 ENCOUNTER — Telehealth: Payer: Self-pay

## 2023-02-17 VITALS — Ht 62.0 in | Wt 165.0 lb

## 2023-02-17 DIAGNOSIS — Z780 Asymptomatic menopausal state: Secondary | ICD-10-CM

## 2023-02-17 DIAGNOSIS — Z0000 Encounter for general adult medical examination without abnormal findings: Secondary | ICD-10-CM

## 2023-02-17 NOTE — Progress Notes (Signed)
Subjective:   [name] is a 77 y.o. female who presents for Medicare Annual (Subsequent) preventive examination.  Visit Complete: Virtual  I connected with  Cheryle Horsfall on 02/17/23 by a audio enabled telemedicine application and verified that I am speaking with the correct person using two identifiers.  Patient Location: Home  Provider Location: Home Office  I discussed the limitations of evaluation and management by telemedicine. The patient expressed understanding and agreed to proceed.  Per patient no change in vitals since last visit; unable to obtain new vitals due to this being a telehealth visit.  Review of Systems    Cardiac Risk Factors include: advanced age (>55men, >72 women);dyslipidemia;hypertension;Other (see comment), Risk factor comments: CAD, COPD, CKD     Objective:    Today's Vitals   02/17/23 1119  Weight: 165 lb (74.8 kg)  Height: 5\' 2"  (1.575 m)   Body mass index is 30.18 kg/m.     02/17/2023   11:27 AM 11/04/2022    8:44 AM 12/13/2021    1:41 PM 12/11/2021   10:57 AM 04/04/2021   11:57 AM 02/08/2020   10:04 AM 01/31/2019    3:26 PM  Advanced Directives  Does Patient Have a Medical Advance Directive? Yes No No No No No No  Type of Estate agent of [place];Living will        Copy of Healthcare Power of Attorney in Chart? No - copy requested        Would patient like information on creating a medical advance directive?  No - Patient declined No - Patient declined No - Patient declined No - Patient declined Yes (ED - Information included in AVS)     Current Medications (verified) Outpatient Encounter Medications as of 02/17/2023  Medication Sig   acetaminophen (TYLENOL) 500 MG tablet Take 2 tablets (1,000 mg total) by mouth every 6 (six) hours as needed (back pain.).   amitriptyline (ELAVIL) 50 MG tablet TAKE 1 TABLET (50 MG TOTAL) BY MOUTH AT BEDTIME.   aspirin 81 MG EC tablet Take 81 mg by mouth at bedtime.    bisoprolol (ZEBETA) 5 MG tablet TAKE 1 TABLET BY MOUTH DAILY.   dexlansoprazole (DEXILANT) 60 MG capsule TAKE 1 CAPSULE (60 MG TOTAL) BY MOUTH DAILY. **PLEASE CALL OFFICE TO SCHEDULE FOLLOW UP   furosemide (LASIX) 40 MG tablet Take 1 tablet (40 mg total) by mouth daily. (Patient taking differently: Take 40 mg by mouth at bedtime.)   hydrALAZINE (APRESOLINE) 10 MG tablet TAKE 1 TABLET (10 MG TOTAL) BY MOUTH 3 (THREE) TIMES DAILY.   polyethylene glycol powder (GLYCOLAX/MIRALAX) powder Take 17 g by mouth daily as needed for mild constipation.   PREMARIN vaginal cream Place 0.5 g vaginally daily as needed (irritation.).   rosuvastatin (CRESTOR) 20 MG tablet TAKE 1 TABLET (20 MG TOTAL) BY MOUTH AT BEDTIME.   STIOLTO RESPIMAT 2.5-2.5 MCG/ACT AERS INHALE 2 PUFFS INTO THE LUNGS DAILY. NEED APPOINTMENT FOR FURTHER REFILLS   topiramate (TOPAMAX) 50 MG tablet Take 1 tablet (50 mg total) by mouth daily.   traMADol (ULTRAM) 50 MG tablet Take 1 tablet (50 mg total) by mouth daily as needed.   No facility-administered encounter medications on file as of 02/17/2023.    Allergies (verified) Sulfa antibiotics, Sulfonamide derivatives, and Zestril [lisinopril]   History: Past Medical History:  Diagnosis Date   Abdominal pain, left lower quadrant 08/14/2009   Qualifier: Diagnosis of  By: Dorian Pod, Pam     Abnormal CT  scan, stomach    Abnormal LFTs 06/19/2017   AICD (automatic cardioverter/defibrillator) present    Dr.Allred follows   Aortic arch pseudoaneurysm (HCC)    a. followed by Dr. Donata Clay.   Arthritis    Asthma    Benign neoplasm of colon    CAD (coronary artery disease) March 28, 2009   a. anterior STEMI rx with BMS to prox LAD in Mar 28, 2009. b. ISR s/p PTCA 06/2009. c. ISR s/p thrombectomy & PTCA 03/2010 due to late stent thrombosis. // Myoview 04/2019: EF 28, ant, ant-sept, inf-sept scar, no ischemia, high risk (stable>>cont med Rx)    Cardiomyopathy, ischemic 06/12/2011   Chronic renal  insufficiency    stage 3   Chronic systolic CHF (congestive heart failure) (HCC)    a. s/p ST. Jude ICD 28-Mar-2012.   Chronic systolic heart failure (HCC) 08/05/2011   Colon polyp    Complication of anesthesia    COPD (chronic obstructive pulmonary disease) (HCC)    CORONARY ARTERY DISEASE 04/24/2009   Qualifier: Diagnosis of  By: Myrtie Hawk, Amy S    DDD (degenerative disc disease), lumbar 05/16/2011   Depression    "since my son died in 27-Nov-2022" 03-28-18)   Diverticulosis    DIVERTICULOSIS-COLON 08/14/2009   Qualifier: Diagnosis of  By: Monica Becton PA-c, Amy S    DYSPHAGIA UNSPECIFIED 04/24/2009   Qualifier: Diagnosis of  By: Dorian Pod, Pam     Dyspnea    "when I lay down at night"    Essential hypertension 09/05/2009   Qualifier: Diagnosis of  By: Yancey Flemings CMA, Jennifer     Gastric polyp    GERD 10/19/2007   Qualifier: Diagnosis of  By: Luberta Robertson RN, Eli Phillips: Diagnosis of  By: Dorian Pod, Pam     GERD (gastroesophageal reflux disease)    GI bleed    a. h/o GIB on DAPT, now on ASA only.   Headache 01/03/2013   Hearing loss    left ear   History of pulmonary embolus (PE) 01/02/2011   Hyperlipemia    Hyperlipidemia, unspecified 04/24/2009   Qualifier: Diagnosis of  By: Monica Becton PA-c, Amy S    Hypertension    ICD-St.Jude 08/06/2011   S/p St. Jude ICD placement 08/05/11    Insomnia    Irritable bowel syndrome    Ischemic cardiomyopathy    EF 10-15%   Memory loss    Migraine headache without aura    Myocardial infarct (HCC) 2011 x 2   Dr. 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   Pulmonary embolism (HCC) 03-28-10   Stroke [facility])    'When I was young." no residual   Thoracic aortic aneurysm (HCC) 03/31/2018    TOBACCO ABUSE 04/04/2009   Qualifier: Diagnosis of  By: Jolene Provost  Qualifier: Diagnosis of  By: Riley Kill, MD, Johny Sax    Transfusion history    ?'12 or '13   UNSPECIFIED ANEMIA 07/03/2010   Qualifier: Diagnosis of  By: Omer Jack     Unspecified mastoiditis    Past Surgical History:  Procedure Laterality Date   ANGIOPLASTY  07/02/09, 04/01/10   BILATERAL SALPINGOOPHORECTOMY     CAD( bare metal stent)  03/28/09   x  1   CAROTID-SUBCLAVIAN BYPASS GRAFT Left 03/31/2018   Procedure: LEFT SUBCLAVIAN ARTERY BYPASS GRAFT;  Surgeon: Nada Libman, MD;  Location: [facility] OR;  Service: Vascular;  Laterality: Left;   CERVICAL SPINE SURGERY  08/08   COLONOSCOPY WITH PROPOFOL N/A 04/29/2016   Procedure: COLONOSCOPY WITH PROPOFOL;  Surgeon: Ruffin Frederick, MD;  Location: [facility] ENDOSCOPY;  Service: Gastroenterology;  Laterality: N/A;   ESOPHAGOGASTRODUODENOSCOPY N/A 12/09/2016   Procedure: ESOPHAGOGASTRODUODENOSCOPY (EGD);  Surgeon: Ruffin Frederick, MD;  Location: [facility];  Service: Gastroenterology;  Laterality: N/A;   ESOPHAGOGASTRODUODENOSCOPY (EGD) WITH PROPOFOL N/A 04/29/2016   Procedure: ESOPHAGOGASTRODUODENOSCOPY (EGD) WITH PROPOFOL;  Surgeon: Ruffin Frederick, MD;  Location: [facility] ENDOSCOPY;  Service: Gastroenterology;  Laterality: N/A;   EUS N/A 05/15/2016   Procedure: UPPER ENDOSCOPIC ULTRASOUND (EUS) RADIAL;  Surgeon: Rachael Fee, MD;  Location: [facility] ENDOSCOPY;  Service: Endoscopy;  Laterality: N/A;   ICD GENERATOR CHANGEOUT N/A 11/04/2022   Procedure: ICD GENERATOR CHANGEOUT;  Surgeon: Nelly Laurence, Roberts Gaudy, MD;  Location: [facility] INVASIVE CV LAB;  Service: Cardiovascular;  Laterality: N/A;   IMPLANTABLE CARDIOVERTER DEFIBRILLATOR IMPLANT N/A 08/05/2011   Primary prevention SJM ICD implanted,  Analyze ST study patient   INNER EAR SURGERY     left x 17   LUMBAR DISC SURGERY  02/2008   fusion   NASAL SEPTUM SURGERY     RIGHT HEART CATH N/A 12/11/2021    Procedure: RIGHT HEART CATH;  Surgeon: Tonny Bollman, MD;  Location: [facility] INVASIVE CV LAB;  Service: Cardiovascular;  Laterality: N/A;   THORACIC AORTIC ENDOVASCULAR STENT GRAFT N/A 03/31/2018   Procedure: THORACIC AORTIC ENDOVASCULAR STENT GRAFT;  Surgeon: Nada Libman, MD;  Location: [facility] OR;  Service: Vascular;  Laterality: N/A;   TOTAL ABDOMINAL HYSTERECTOMY     complete   Family History  Problem Relation Age of Onset   Colon cancer Mother    Other Brother        tumor in his brain   Bladder Cancer Brother    Throat cancer Brother    Cancer Son        kidney, spread to his liver   Breast cancer Cousin    Social History   Socioeconomic History   Marital status: Divorced    Spouse name: Not on file   Number of children: 2   Years of education: 11   Highest education level: 12th grade  Occupational History   Occupation: Designer, industrial/product: UNEMPLOYED    Employer: DISABLED  Tobacco Use   Smoking status: Former    Current packs/day: 0.13    Average packs/day: 0.1 packs/day for 30.0 years (3.9 ttl pk-yrs)    Types: Cigarettes   Smokeless tobacco: Never  Vaping Use   Vaping status: Never Used  Substance and Sexual Activity   Alcohol use: No    Alcohol/week: 0.0 standard drinks of alcohol   Drug use: No   Sexual activity: Not Currently  Other Topics Concern   Not on file  Social History Narrative   Pt lives in [place] alone.  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   Patient has 11 th grade education.Right handed.   Caffeine- one cup daily         Social Determinants of Health   Financial Resource Strain: Low Risk  (02/17/2023)   Overall Financial Resource Strain (CARDIA)    Difficulty of Paying Living Expenses: Not very hard  Recent Concern: Financial Resource Strain - Medium Risk (11/20/2022)  Overall Financial Resource Strain (CARDIA)    Difficulty of Paying Living Expenses: Somewhat hard  Food Insecurity: No Food Insecurity (02/17/2023)    Hunger Vital Sign    Worried About Running Out of Food in the Last Year: Never true    Ran Out of Food in the Last Year: Never true  Transportation Needs: No Transportation Needs (02/17/2023)   PRAPARE - Administrator, Civil Service (Medical): No    Lack of Transportation (Non-Medical): No  Physical Activity: Inactive (02/17/2023)   Exercise Vital Sign    Days of Exercise per Week: 0 days    Minutes of Exercise per Session: 0 min  Stress: No Stress Concern Present (02/17/2023)   [employer] of Occupational Health - Occupational Stress Questionnaire    Feeling of Stress : Not at all  Recent Concern: Stress - Stress Concern Present (11/20/2022)   [employer] of Occupational Health - Occupational Stress Questionnaire    Feeling of Stress : To some extent  Social Connections: Socially Isolated (02/17/2023)   Social Connection and Isolation Panel [NHANES]    Frequency of Communication with Friends and Family: Never    Frequency of Social Gatherings with Friends and Family: Never    Attends Religious Services: 1 to 4 times per year    Active Member of [employer] or Organizations: No    Attends Engineer, structural: Never    Marital Status: Divorced    Tobacco Counseling Counseling given: Not Answered   Clinical Intake:  Pre-visit preparation completed: Yes  Pain : No/denies pain     BMI - recorded: 30.18 Nutritional Risks: None Diabetes: No  How often do you need to have someone help you when you read instructions, pamphlets, or other written materials from your doctor or pharmacy?: 1 - Never  Interpreter Needed?: No  Information entered by :: Genevie Elman, RMA   Activities of Daily Living    02/17/2023   11:22 AM  In your present state of health, do you have any difficulty performing the following activities:  Hearing? 1  Comment per patient- has issue with lt ear, has an hearing aide but does not wear it.  Vision? 1  Difficulty  concentrating or making decisions? 1  Walking or climbing stairs? 1  Dressing or bathing? 0  Doing errands, shopping? 0  Preparing Food and eating ? N  Using the Toilet? N  In the past six months, have you accidently leaked urine? Y  Do you have problems with loss of bowel control? Y  Managing your Medications? N  Managing your Finances? N  Housekeeping or managing your Housekeeping? N    Patient Care Team: Georgina Quint, MD as PCP - General (Internal Medicine) Tonny Bollman, MD as PCP - Cardiology (Cardiology) Mealor, Roberts Gaudy, MD as PCP - Electrophysiology (Cardiology) Lovett Sox, MD as Attending Physician (Cardiothoracic Surgery) Donalee Citrin, MD (Neurosurgery) Drema Halon, MD (Inactive) (Otolaryngology) Love, Genene Churn, MD (Neurology) Hillis Range, MD (Inactive) as Consulting Physician (Cardiology) Armbruster, Willaim Rayas, MD as Consulting Physician (Gastroenterology)  Indicate any recent Medical Services you may have received from other than [facility] providers in the past year (date may be approximate).     Assessment:   This is a routine wellness examination for [place].  Hearing/Vision screen Hearing Screening - Comments:: Yes.  Has hearing issues in Lt ear. Vision Screening - Comments:: Need an annual eye exam.  Dietary issues and exercise activities discussed:     Goals Addressed  None   Depression Screen    02/17/2023   11:37 AM 08/20/2022    2:16 PM 05/08/2022    1:45 PM 03/19/2022    1:20 PM 12/13/2021    1:42 PM 12/13/2021    1:39 PM 11/21/2021    1:44 PM  PHQ 2/9 Scores  PHQ - 2 Score 0 0 2 0 0 0 0  PHQ- 9 Score 5  6        Fall Risk    02/17/2023   11:29 AM 08/20/2022    2:15 PM 05/08/2022    1:46 PM 03/19/2022    1:20 PM 12/13/2021    1:42 PM  Fall Risk   Falls in the past year? 1 0 0 0 0  Number falls in past yr: 0 0 0 0 0  Injury with Fall? 0 0 0 0 0  Risk for fall due to : -- Impaired balance/gait;Impaired mobility No Fall  Risks No Fall Risks   Risk for fall due to: Comment hip problems-per patient      Follow up Falls evaluation completed;Education provided;Falls prevention discussed  Falls evaluation completed Falls evaluation completed Falls evaluation completed    MEDICARE RISK AT HOME:  Medicare Risk at Home - 02/17/23 1130     Any stairs in or around the home? No    If so, are there any without handrails? No    Home free of loose throw rugs in walkways, pet beds, electrical cords, etc? Yes    Adequate lighting in your home to reduce risk of falls? Yes    Life alert? Yes   per patient, has one but does not wear it due to pace maker   Use of a cane, walker or w/c? No    Grab bars in the bathroom? Yes    Shower chair or bench in shower? Yes    Elevated toilet seat or a handicapped toilet? Yes             TIMED UP AND GO:  Was the test performed?  No    Cognitive Function:        02/17/2023   11:32 AM 02/08/2020   10:04 AM 01/31/2019    3:26 PM  6CIT Screen  What Year? 0 points 0 points 0 points  What month? 0 points 0 points 0 points  What time? 0 points 0 points 0 points  Count back from 20 0 points 0 points 0 points  Months in reverse 4 points 0 points 4 points  Repeat phrase 4 points 10 points 0 points  Total Score 8 points 10 points 4 points    Immunizations Immunization History  Administered Date(s) Administered   Fluad Quad(high Dose 65+) 04/03/2016, 04/21/2017, 04/06/2020, 03/27/2021, 04/27/2022   Influenza Nasal 04/27/2017   Influenza Split 08/06/2011   Influenza Whole 04/27/2009   Influenza, High Dose Seasonal PF 04/29/2013, 06/09/2014, 05/12/2015, 04/03/2016, 04/21/2017, 04/17/2018, 04/25/2018, 04/18/2019   Influenza-Unspecified 04/15/2012   Moderna Sars-Covid-2 Vaccination 09/09/2019   Pneumococcal Conjugate-13 06/14/2014, 06/16/2018   Pneumococcal Polysaccharide-23 04/27/2009, 08/06/2011, 04/15/2012, 04/03/2016   Zoster, Live 05/03/2013    TDAP status: Due,  Education has been provided regarding the importance of this vaccine. Advised may receive this vaccine at local pharmacy or Health Dept. Aware to provide a copy of the vaccination record if obtained from local pharmacy or Health Dept. Verbalized acceptance and understanding.  Flu Vaccine status: Up to date  Pneumococcal vaccine status: Up to date  Covid-19 vaccine status: Completed vaccines  Qualifies for Shingles Vaccine? Yes   Zostavax completed Yes   Shingrix Completed?: No.    Education has been provided regarding the importance of this vaccine. Patient has been advised to call insurance company to determine out of pocket expense if they have not yet received this vaccine. Advised may also receive vaccine at local pharmacy or Health Dept. Verbalized acceptance and understanding.  Screening Tests Health Maintenance  Topic Date Due   DEXA SCAN  Never done   COVID-19 Vaccine (2 - Moderna risk series) 10/07/2019   Zoster Vaccines- Shingrix (1 of 2) 02/23/2023 (Originally 08/07/1964)   INFLUENZA VACCINE  02/26/2023   Medicare Annual Wellness (AWV)  02/17/2024   Colonoscopy  04/29/2026   Pneumonia Vaccine 65+ Years old  Completed   Hepatitis C Screening  Completed   HPV VACCINES  Aged Out   DTaP/Tdap/Td  Discontinued    Health Maintenance  Health Maintenance Due  Topic Date Due   DEXA SCAN  Never done   COVID-19 Vaccine (2 - Moderna risk series) 10/07/2019    Colorectal cancer screening: Type of screening: Colonoscopy. Completed 04/29/2016. Repeat every 10 years  Mammogram status: Completed 01/2023, per patient at [facility]. Repeat every year.  Bone Density status: Ordered (sent a TE to provider, 02/17/2023). Pt provided with contact info and advised to call to schedule appt. Patient would prefer to have it [facility] at [facility].  Lung Cancer Screening: (Low Dose CT Chest recommended if Age 78-80 years, 20 pack-year currently smoking OR have quit w/in 15years.) does not  qualify.   Lung Cancer Screening Referral: N/A  Additional Screening:  Hepatitis C Screening: does qualify; Completed 04/08/2010  Vision Screening: Recommended annual ophthalmology exams for early detection of glaucoma and other disorders of the eye. Is the patient up to date with their annual eye exam?  No  Who is the provider or what is the name of the office in which the patient attends annual eye exams? N/A would like a recommendation from PCP. If pt is not established with a provider, would they like to be referred to a provider to establish care? Yes .   Dental Screening: Recommended annual dental exams for proper oral hygiene  Community Resource Referral / Chronic Care Management: CRR required this visit?  No   CCM required this visit?  No     Plan:     I have personally reviewed and noted the following in the patient's chart:   Medical and social history Use of alcohol, tobacco or illicit drugs  Current medications and supplements including opioid prescriptions. Patient is not currently taking opioid prescriptions. Functional ability and status Nutritional status Physical activity Advanced directives List of other physicians Hospitalizations, surgeries, and ER visits in previous 12 months Vitals Screenings to include cognitive, depression, and falls Referrals and appointments  In addition, I have reviewed and discussed with patient certain preventive protocols, quality metrics, and best practice recommendations. A written personalized care plan for preventive services as well as general preventive health recommendations were provided to patient.     Rayane Gallardo L Khamia Stambaugh, CMA   02/17/2023   After Visit Summary: (MyChart) Due to this being a telephonic visit, the after visit summary with patients personalized plan was offered to patient via MyChart   Nurse Notes: Patient is due for a Tetanus vaccines but would like to discuss with PCP during her next office visit.  A TE  has been sent to provider for a Bone Density scanning.  Patient also would like  a recommendation for an eye doctor.  Patient did inform me that she had a mammogram performed this month with [facility].

## 2023-02-17 NOTE — Patient Instructions (Signed)
[name] , Thank you for taking time to come for your Medicare Wellness Visit. I appreciate your ongoing commitment to your health goals. Please review the following plan we discussed and let me know if I can assist you in the future.   These are the goals we discussed:  Goals      Weight (lb) < 200 lb (90.7 kg)        This is a list of the screening recommended for you and due dates:  Health Maintenance  Topic Date Due   DEXA scan (bone density measurement)  Never done   COVID-19 Vaccine (2 - Moderna risk series) 10/07/2019   Zoster (Shingles) Vaccine (1 of 2) 02/23/2023*   Flu Shot  02/26/2023   Medicare Annual Wellness Visit  02/17/2024   Colon Cancer Screening  04/29/2026   Pneumonia Vaccine  Completed   Hepatitis C Screening  Completed   HPV Vaccine  Aged Out   DTaP/Tdap/Td vaccine  Discontinued  *Topic was postponed. The date shown is not the original due date.    Advanced directives: Please bring a copy of your health care power of attorney and living will to the office to be added to your chart at your convenience. Per patient- Her sone has POA.  Conditions/risks identified: Each day, aim for 6 glasses of water, plenty of protein in your diet and try to get up and walk/ stretch every hour for 5-10 minutes at a time.  Remember to discuss a recommendation for an eye doctor to keep up with annual eye examinations.  Also to discuss a Bone Density scanning and a Tetanus vaccination with your PCP during your next office visit with him.    Next appointment: Follow up in one year for your annual wellness visit    Preventive Care 65 Years and Older, Female Preventive care refers to lifestyle choices and visits with your health care provider that can promote health and wellness. What does preventive care include? A yearly physical exam. This is also called an annual well check. Dental exams once or twice a year. Routine eye exams. Ask your health care provider how often you  should have your eyes checked. Personal lifestyle choices, including: Daily care of your teeth and gums. Regular physical activity. Eating a healthy diet. Avoiding tobacco and drug use. Limiting alcohol use. Practicing safe sex. Taking low-dose aspirin every day. Taking vitamin and mineral supplements as recommended by your health care provider. What happens during an annual well check? The services and screenings done by your health care provider during your annual well check will depend on your age, overall health, lifestyle risk factors, and family history of disease. Counseling  Your health care provider may ask you questions about your: Alcohol use. Tobacco use. Drug use. Emotional well-being. Home and relationship well-being. Sexual activity. Eating habits. History of falls. Memory and ability to understand (cognition). Work and work Astronomer. Reproductive health. Screening  You may have the following tests or measurements: Height, weight, and BMI. Blood pressure. Lipid and cholesterol levels. These may be checked every 5 years, or more frequently if you are over 62 years old. Skin check. Lung cancer screening. You may have this screening every year starting at age 11 if you have a 30-pack-year history of smoking and currently smoke or have quit within the past 15 years. Fecal occult blood test (FOBT) of the stool. You may have this test every year starting at age 82. Flexible sigmoidoscopy or colonoscopy. You may have a  sigmoidoscopy every 5 years or a colonoscopy every 10 years starting at age 32. Hepatitis C blood test. Hepatitis B blood test. Sexually transmitted disease (STD) testing. Diabetes screening. This is done by checking your blood sugar (glucose) after you have not eaten for a while (fasting). You may have this done every 1-3 years. Bone density scan. This is done to screen for osteoporosis. You may have this done starting at age 4. Mammogram. This may  be done every 1-2 years. Talk to your health care provider about how often you should have regular mammograms. Talk with your health care provider about your test results, treatment options, and if necessary, the need for more tests. Vaccines  Your health care provider may recommend certain vaccines, such as: Influenza vaccine. This is recommended every year. Tetanus, diphtheria, and acellular pertussis (Tdap, Td) vaccine. You may need a Td booster every 10 years. Zoster vaccine. You may need this after age 66. Pneumococcal 13-valent conjugate (PCV13) vaccine. One dose is recommended after age 27. Pneumococcal polysaccharide (PPSV23) vaccine. One dose is recommended after age 39. Talk to your health care provider about which screenings and vaccines you need and how often you need them. This information is not intended to replace advice given to you by your health care provider. Make sure you discuss any questions you have with your health care provider. Document Released: 08/10/2015 Document Revised: 04/02/2016 Document Reviewed: 05/15/2015 Elsevier Interactive Patient Education  2017 [employer].  Fall Prevention in the Home Falls can cause injuries. They can happen to people of all ages. There are many things you can do to make your home safe and to help prevent falls. What can I do on the outside of my home? Regularly fix the edges of walkways and driveways and fix any cracks. Remove anything that might make you trip as you walk through a door, such as a raised step or threshold. Trim any bushes or trees on the path to your home. Use bright outdoor lighting. Clear any walking paths of anything that might make someone trip, such as rocks or tools. Regularly check to see if handrails are loose or broken. Make sure that both sides of any steps have handrails. Any raised decks and porches should have guardrails on the edges. Have any leaves, snow, or ice cleared regularly. Use sand or salt  on walking paths during winter. Clean up any spills in your garage right away. This includes oil or grease spills. What can I do in the bathroom? Use night lights. Install grab bars by the toilet and in the tub and shower. Do not use towel bars as grab bars. Use non-skid mats or decals in the tub or shower. If you need to sit down in the shower, use a plastic, non-slip stool. Keep the floor dry. Clean up any water that spills on the floor as soon as it happens. Remove soap buildup in the tub or shower regularly. Attach bath mats securely with double-sided non-slip rug tape. Do not have throw rugs and other things on the floor that can make you trip. What can I do in the bedroom? Use night lights. Make sure that you have a light by your bed that is easy to reach. Do not use any sheets or blankets that are too big for your bed. They should not hang down onto the floor. Have a firm chair that has side arms. You can use this for support while you get dressed. Do not have throw rugs and other  things on the floor that can make you trip. What can I do in the kitchen? Clean up any spills right away. Avoid walking on wet floors. Keep items that you use a lot in easy-to-reach places. If you need to reach something above you, use a strong step stool that has a grab bar. Keep electrical cords out of the way. Do not use floor polish or wax that makes floors slippery. If you must use wax, use non-skid floor wax. Do not have throw rugs and other things on the floor that can make you trip. What can I do with my stairs? Do not leave any items on the stairs. Make sure that there are handrails on both sides of the stairs and use them. Fix handrails that are broken or loose. Make sure that handrails are as long as the stairways. Check any carpeting to make sure that it is firmly attached to the stairs. Fix any carpet that is loose or worn. Avoid having throw rugs at the top or bottom of the stairs. If you  do have throw rugs, attach them to the floor with carpet tape. Make sure that you have a light switch at the top of the stairs and the bottom of the stairs. If you do not have them, ask someone to add them for you. What else can I do to help prevent falls? Wear shoes that: Do not have high heels. Have rubber bottoms. Are comfortable and fit you well. Are closed at the toe. Do not wear sandals. If you use a stepladder: Make sure that it is fully opened. Do not climb a closed stepladder. Make sure that both sides of the stepladder are locked into place. Ask someone to hold it for you, if possible. Clearly mark and make sure that you can see: Any grab bars or handrails. First and last steps. Where the edge of each step is. Use tools that help you move around (mobility aids) if they are needed. These include: Canes. Walkers. Scooters. Crutches. Turn on the lights when you go into a dark area. Replace any light bulbs as soon as they burn out. Set up your furniture so you have a clear path. Avoid moving your furniture around. If any of your floors are uneven, fix them. If there are any pets around you, be aware of where they are. Review your medicines with your doctor. Some medicines can make you feel dizzy. This can increase your chance of falling. Ask your doctor what other things that you can do to help prevent falls. This information is not intended to replace advice given to you by your health care provider. Make sure you discuss any questions you have with your health care provider. Document Released: 05/10/2009 Document Revised: 12/20/2015 Document Reviewed: 08/18/2014 Elsevier Interactive Patient Education  2017 [employer].

## 2023-02-23 ENCOUNTER — Encounter: Payer: Self-pay | Admitting: Cardiovascular Disease

## 2023-02-24 NOTE — Progress Notes (Signed)
Remote ICD transmission.   

## 2023-03-07 ENCOUNTER — Other Ambulatory Visit: Payer: Self-pay | Admitting: Obstetrics and Gynecology

## 2023-03-07 DIAGNOSIS — N6489 Other specified disorders of breast: Secondary | ICD-10-CM

## 2023-03-18 ENCOUNTER — Ambulatory Visit (INDEPENDENT_AMBULATORY_CARE_PROVIDER_SITE_OTHER): Payer: 59 | Admitting: Orthopedic Surgery

## 2023-03-18 DIAGNOSIS — M25451 Effusion, right hip: Secondary | ICD-10-CM | POA: Diagnosis not present

## 2023-03-18 DIAGNOSIS — M25551 Pain in right hip: Secondary | ICD-10-CM

## 2023-03-18 LAB — LAB REPORT - SCANNED
Creatinine, POC: 162.5 mg/dL
EGFR: 26

## 2023-03-19 ENCOUNTER — Encounter: Payer: Self-pay | Admitting: Orthopedic Surgery

## 2023-03-19 NOTE — Progress Notes (Signed)
Office Visit Note   Patient: [name]           Date of Birth: April 08, 1946           MRN: [id number] Visit Date: 03/18/2023 Requested by: Georgina Quint, MD [address] [place],  [place] [address] PCP: Georgina Quint, MD  Subjective: Chief Complaint  Patient presents with   Left Hip - Pain    HPI: [name] is a 77 y.o. female who presents to the office reporting right hip pain and left hip pain.  She is here for 74-month follow-up on the right hip.  Decision today was for or against repeat aspiration and injection.  Overall the right hip is doing reasonly well.  She has known arthritis in the right hip.  Has only occasional pain at night.  She also describes left hip pain since the last visit.  Is only occasional and not constant.  She actually has to wait when she stands up and starts walking.  She has done a lot of physical therapy.  She also has a history of back surgery.  CT pelvis from February demonstrates mild degenerative changes of both hips and sacroiliac joints..                ROS: All systems reviewed are negative as they relate to the chief complaint within the history of present illness.  Patient denies fevers or chills.  Assessment & Plan: Visit Diagnoses:  1. Effusion of right hip   2. Pain in right hip     Plan: Impression is back and hip pain.  Decision point today was for or against any further injections of the right hip.  She is fairly functional right now particularly compared to where she was several visits ago.  Reviewed stay the course for now and have her continue with activity as tolerated.  Avoid a lot of unnecessary loading axially.  Follow-up as needed.  Follow-Up Instructions: No follow-ups on file.   Orders:  No orders of the defined types were placed in this encounter.  No orders of the defined types were placed in this encounter.     Procedures: No procedures performed   Clinical Data: No additional  findings.  Objective: Vital Signs: There were no vitals taken for this visit.  Physical Exam:  Constitutional: Patient appears well-developed HEENT:  Head: Normocephalic Eyes:EOM are normal Neck: Normal range of motion Cardiovascular: Normal rate Pulmonary/chest: Effort normal Neurologic: Patient is alert Skin: Skin is warm Psychiatric: Patient has normal mood and affect  Ortho Exam: Ortho exam demonstrates fairly minimal groin pain with internal/external rotation of either hip.  No nerve root tension signs.  No paresthesias L1 S1 bilaterally.  Does have some pain with forward lateral bending but no trochanteric tenderness.  No muscle atrophy in either leg.  5 out of 5 ankle dorsiflexion plantarflexion quad and hamstring strength  Specialty Comments:  No specialty comments available.  Imaging: No results found.   PMFS History: Patient Active Problem List   Diagnosis Date Noted   Stage 4 chronic kidney disease (HCC) 11/25/2022   Pelvic pain 11/24/2022   Chronic bilateral low back pain without sciatica 05/08/2022   Spinal stenosis of lumbar region without neurogenic claudication 05/08/2022   HFrEF (heart failure with reduced ejection fraction) (HCC) 12/02/2021   Stage 3b chronic kidney disease (HCC) 08/08/2021   Chronic insomnia 11/14/2019   Colon polyp 06/16/2018   Thoracic aortic aneurysm without rupture (HCC) 03/31/2018  Chronic renal insufficiency    AICD (automatic cardioverter/defibrillator) present    Ischemic cardiomyopathy    COPD (chronic obstructive pulmonary disease) (HCC)    Hyperlipemia    Insomnia    Pre-diabetes 06/19/2017   Gastric polyp    Osteoporosis 05/11/2013   Hearing loss 04/29/2013   Mild cognitive impairment 04/29/2013   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   Implantable cardioverter-defibrillator (ICD) in situ 08/06/2011   DDD (degenerative disc disease), lumbar 05/16/2011   History of pulmonary embolus (PE) 01/02/2011    UNSPECIFIED ANEMIA 07/03/2010   Essential hypertension 09/05/2009   DIVERTICULOSIS-COLON 08/14/2009   PERSONAL HX COLONIC POLYPS 04/24/2009   TOBACCO ABUSE 04/04/2009   Coronary artery disease involving native coronary artery of native heart with angina pectoris (HCC) 02/25/2009   GERD 10/19/2007   IBS 10/19/2007   COLONIC POLYPS, ADENOMATOUS 05/12/2007   Past Medical History:  Diagnosis Date   Abdominal pain, left lower quadrant 08/14/2009   Qualifier: Diagnosis of  By: Dorian Pod, Pam     Abnormal CT scan, stomach    Abnormal LFTs 06/19/2017   AICD (automatic cardioverter/defibrillator) present    Dr.Allred follows   Aortic arch pseudoaneurysm (HCC)    a. followed by Dr. Donata Clay.   Arthritis    Asthma    Benign neoplasm of colon    CAD (coronary artery disease) 02/2009   a. anterior STEMI rx with BMS to prox LAD in 02/2009. b. ISR s/p PTCA 06/2009. c. ISR s/p thrombectomy & PTCA 15-Apr-2010 due to late stent thrombosis. // Myoview 04/2019: EF 28, ant, ant-sept, inf-sept scar, no ischemia, high risk (stable>>cont med Rx)    Cardiomyopathy, ischemic 06/12/2011   Chronic renal insufficiency    stage 3   Chronic systolic CHF (congestive heart failure) (HCC)    a. s/p ST. Jude ICD 15-Apr-2012.   Chronic systolic heart failure (HCC) 08/05/2011   Colon polyp    Complication of anesthesia    COPD (chronic obstructive pulmonary disease) (HCC)    CORONARY ARTERY DISEASE 04/24/2009   Qualifier: Diagnosis of  By: Myrtie Hawk, Amy S    DDD (degenerative disc disease), lumbar 05/16/2011   Depression    "since my son died in 11/14/22" April 15, 2018)   Diverticulosis    DIVERTICULOSIS-COLON 08/14/2009   Qualifier: Diagnosis of  By: Monica Becton PA-c, Amy S    DYSPHAGIA UNSPECIFIED 04/24/2009   Qualifier: Diagnosis of  By: Dorian Pod, Pam     Dyspnea    "when I lay down at night"    Essential hypertension 09/05/2009   Qualifier: Diagnosis of  By: Yancey Flemings CMA, Jennifer     Gastric polyp    GERD 10/19/2007    Qualifier: Diagnosis of  By: Luberta Robertson RN, Eli Phillips: Diagnosis of  By: Dorian Pod, Pam     GERD (gastroesophageal reflux disease)    GI bleed    a. h/o GIB on DAPT, now on ASA only.   Headache 01/03/2013   Hearing loss    left ear   History of pulmonary embolus (PE) 01/02/2011   Hyperlipemia    Hyperlipidemia, unspecified 04/24/2009   Qualifier: Diagnosis of  By: Monica Becton PA-c, Amy S    Hypertension    ICD-St.Jude 08/06/2011   S/p St. Jude ICD placement 08/05/11    Insomnia    Irritable bowel syndrome    Ischemic cardiomyopathy    EF 10-15%   Memory loss    Migraine headache without aura    Myocardial infarct [facility]) 15-Apr-2010  x 2   Dr. 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   Pulmonary embolism (HCC) 2011   Stroke [facility])    'When I was young." no residual   Thoracic aortic aneurysm (HCC) 03/31/2018   TOBACCO ABUSE 04/04/2009   Qualifier: Diagnosis of  By: Jolene Provost  Qualifier: Diagnosis of  By: Riley Kill, MD, Johny Sax    Transfusion history    ?'12 or '13   UNSPECIFIED ANEMIA 07/03/2010   Qualifier: Diagnosis of  By: Omer Jack     Unspecified mastoiditis     Family History  Problem Relation Age of Onset   Colon cancer Mother    Other Brother        tumor in his brain   Bladder Cancer Brother    Throat cancer Brother    Cancer Son        kidney, spread to his liver   Breast cancer Cousin     Past Surgical History:  Procedure Laterality Date   ANGIOPLASTY  07/02/09, 04/01/10   BILATERAL SALPINGOOPHORECTOMY     CAD( bare metal stent)  02/2009   x 1   CAROTID-SUBCLAVIAN BYPASS GRAFT Left 03/31/2018   Procedure: LEFT SUBCLAVIAN ARTERY BYPASS GRAFT;  Surgeon: Nada Libman,  MD;  Location: [facility] OR;  Service: Vascular;  Laterality: Left;   CERVICAL SPINE SURGERY  08/08   COLONOSCOPY WITH PROPOFOL N/A 04/29/2016   Procedure: COLONOSCOPY WITH PROPOFOL;  Surgeon: Ruffin Frederick, MD;  Location: [facility] ENDOSCOPY;  Service: Gastroenterology;  Laterality: N/A;   ESOPHAGOGASTRODUODENOSCOPY N/A 12/09/2016   Procedure: ESOPHAGOGASTRODUODENOSCOPY (EGD);  Surgeon: Ruffin Frederick, MD;  Location: [facility];  Service: Gastroenterology;  Laterality: N/A;   ESOPHAGOGASTRODUODENOSCOPY (EGD) WITH PROPOFOL N/A 04/29/2016   Procedure: ESOPHAGOGASTRODUODENOSCOPY (EGD) WITH PROPOFOL;  Surgeon: Ruffin Frederick, MD;  Location: [facility] ENDOSCOPY;  Service: Gastroenterology;  Laterality: N/A;   EUS N/A 05/15/2016   Procedure: UPPER ENDOSCOPIC ULTRASOUND (EUS) RADIAL;  Surgeon: Rachael Fee, MD;  Location: [facility] ENDOSCOPY;  Service: Endoscopy;  Laterality: N/A;   ICD GENERATOR CHANGEOUT N/A 11/04/2022   Procedure: ICD GENERATOR CHANGEOUT;  Surgeon: Nelly Laurence, Roberts Gaudy, MD;  Location: [facility] INVASIVE CV LAB;  Service: Cardiovascular;  Laterality: N/A;   IMPLANTABLE CARDIOVERTER DEFIBRILLATOR IMPLANT N/A 08/05/2011   Primary prevention SJM ICD implanted,  Analyze ST study patient   INNER EAR SURGERY     left x 17   LUMBAR DISC SURGERY  02/2008   fusion   NASAL SEPTUM SURGERY     RIGHT HEART CATH N/A 12/11/2021   Procedure: RIGHT HEART CATH;  Surgeon: Tonny Bollman, MD;  Location: [facility] INVASIVE CV LAB;  Service: Cardiovascular;  Laterality: N/A;   THORACIC AORTIC ENDOVASCULAR STENT GRAFT N/A 03/31/2018   Procedure: THORACIC AORTIC ENDOVASCULAR STENT GRAFT;  Surgeon: Nada Libman, MD;  Location: [facility] OR;  Service: Vascular;  Laterality: N/A;   TOTAL ABDOMINAL HYSTERECTOMY     complete   Social History   Occupational History   Occupation: Unemployed    Employer: UNEMPLOYED    Employer: DISABLED  Tobacco Use   Smoking status: Former    Current packs/day: 0.13    Average packs/day:  0.1 packs/day for 30.0 years (  3.9 ttl pk-yrs)    Types: Cigarettes   Smokeless tobacco: Never  Vaping Use   Vaping status: Never Used  Substance and Sexual Activity   Alcohol use: No    Alcohol/week: 0.0 standard drinks of alcohol   Drug use: No   Sexual activity: Not Currently

## 2023-03-24 ENCOUNTER — Ambulatory Visit (INDEPENDENT_AMBULATORY_CARE_PROVIDER_SITE_OTHER): Payer: 59

## 2023-03-24 ENCOUNTER — Ambulatory Visit (INDEPENDENT_AMBULATORY_CARE_PROVIDER_SITE_OTHER): Payer: 59 | Admitting: Podiatry

## 2023-03-24 DIAGNOSIS — M21612 Bunion of left foot: Secondary | ICD-10-CM | POA: Diagnosis not present

## 2023-03-24 DIAGNOSIS — M79672 Pain in left foot: Secondary | ICD-10-CM

## 2023-03-24 DIAGNOSIS — M2012 Hallux valgus (acquired), left foot: Secondary | ICD-10-CM | POA: Diagnosis not present

## 2023-03-24 DIAGNOSIS — M2042 Other hammer toe(s) (acquired), left foot: Secondary | ICD-10-CM | POA: Diagnosis not present

## 2023-03-24 NOTE — Patient Instructions (Signed)
More silicone pads can be purchased from:  [email or web address]  

## 2023-03-25 NOTE — Progress Notes (Signed)
  Subjective:  Patient ID: [name], female    DOB: 1945/10/19,  MRN: [id number]  No chief complaint on file.   77 y.o. female presents with the above complaint. History confirmed with patient.  Her second toe crosses over her big toe and her big toe points towards the outside of the foot on the left foot.  The second toe rubs in the top of the shoe but the bunion and big toe is not painful  Objective:  Physical Exam: warm, good capillary refill, no trophic changes or ulcerative lesions, normal DP and PT pulses, normal sensory exam, and crossover toe deformity with severe hallux valgus and hammertoe of the left foot.   Radiographs: Multiple views x-ray of the left foot:  Severe hallux valgus deformity, digital contractures crossover toe and osteoarthritis of the metatarsal phalangeal joints Assessment:   1. Left foot pain   2. Hammertoe of left foot   3. Hallux valgus with bunions, left      Plan:  Patient was evaluated and treated and all questions answered.   We reviewed her x-rays.  We discussed the presence of the hallux valgus deformity and hammertoe.  Correction and straightening of the toe would be quite difficult and would require first metatarsal phalangeal joint arthrodesis, she has fairly significant cardiac history and history of smoking as well as osteoporosis that would make this quite risky.  She says it only particularly hurts when she is in certain shoes.  We discussed which shoes would be most comfortable, she says she has lots of shoes that she wants to wear and is frustrated she would not be able to wear these.  I discussed with her that amputation of the second toe would be an option that would have a much easier recovery and would get rid of the main problem although the hallux valgus likely would be to increase although this is not symptomatic for her currently.  She would like to discuss with her son and she will return with him to follow-up with me so we can  discuss together.  Return if symptoms worsen or fail to improve.

## 2023-04-06 ENCOUNTER — Ambulatory Visit (INDEPENDENT_AMBULATORY_CARE_PROVIDER_SITE_OTHER): Payer: 59 | Admitting: Podiatry

## 2023-04-06 ENCOUNTER — Encounter: Payer: Self-pay | Admitting: Podiatry

## 2023-04-06 DIAGNOSIS — M2042 Other hammer toe(s) (acquired), left foot: Secondary | ICD-10-CM | POA: Diagnosis not present

## 2023-04-06 DIAGNOSIS — M2012 Hallux valgus (acquired), left foot: Secondary | ICD-10-CM | POA: Diagnosis not present

## 2023-04-06 DIAGNOSIS — M21612 Bunion of left foot: Secondary | ICD-10-CM

## 2023-04-06 NOTE — Progress Notes (Signed)
  Subjective:  Patient ID: [name], female    DOB: [birth year],  MRN: [id number]  Chief Complaint  Patient presents with   Foot Pain    Left foot pain f/u     77 y.o. female presents with the above complaint. History confirmed with patient.  She return for follow-up today here with her son again  Objective:  Physical Exam: warm, good capillary refill, no trophic changes or ulcerative lesions, normal DP and PT pulses, normal sensory exam, and crossover toe deformity with severe hallux valgus and hammertoe of the left foot.   Radiographs: Multiple views x-ray of the left foot:  Severe hallux valgus deformity, digital contractures crossover toe and osteoarthritis of the metatarsal phalangeal joints Assessment:   1. Hammertoe of left foot   2. Hallux valgus with bunions, left      Plan:  Patient was evaluated and treated and all questions answered.   I reviewed her condition and x-rays with her son Iantha Fallen who was present today.  We discussed that her only reasonable surgical option is amputation of the toe as reconstruction would be quite lengthy difficult to require multiple procedures and extended time of nonweightbearing for about a month.  He agreed with me that she would not be able to complete this.  He is going to work with her to see if they are able to find comfortable shoes for her.  He will let me know if they would like to return for amputation of the toe.  Return if symptoms worsen or fail to improve.

## 2023-04-15 ENCOUNTER — Ambulatory Visit
Admission: RE | Admit: 2023-04-15 | Discharge: 2023-04-15 | Disposition: A | Discharge status: Home / Self Care | Payer: 59 | Source: Ambulatory Visit | Attending: Obstetrics and Gynecology | Admitting: Obstetrics and Gynecology

## 2023-04-15 DIAGNOSIS — N6489 Other specified disorders of breast: Secondary | ICD-10-CM

## 2023-04-16 ENCOUNTER — Ambulatory Visit (INDEPENDENT_AMBULATORY_CARE_PROVIDER_SITE_OTHER): Payer: 59 | Admitting: Nurse Practitioner

## 2023-04-16 ENCOUNTER — Other Ambulatory Visit: Payer: Self-pay | Admitting: Nurse Practitioner

## 2023-04-16 ENCOUNTER — Ambulatory Visit (INDEPENDENT_AMBULATORY_CARE_PROVIDER_SITE_OTHER): Payer: 59

## 2023-04-16 VITALS — BP 138/80 | HR 102 | Temp 98.6°F | Ht 62.0 in | Wt 165.5 lb

## 2023-04-16 DIAGNOSIS — M25551 Pain in right hip: Secondary | ICD-10-CM | POA: Diagnosis not present

## 2023-04-16 DIAGNOSIS — M546 Pain in thoracic spine: Secondary | ICD-10-CM | POA: Diagnosis not present

## 2023-04-16 DIAGNOSIS — M545 Low back pain, unspecified: Secondary | ICD-10-CM | POA: Diagnosis not present

## 2023-04-16 DIAGNOSIS — N3000 Acute cystitis without hematuria: Secondary | ICD-10-CM

## 2023-04-16 LAB — CBC
HCT: 35.6 % — ABNORMAL LOW (ref 36.0–46.0)
Hemoglobin: 11.6 g/dL — ABNORMAL LOW (ref 12.0–15.0)
MCHC: 32.6 g/dL (ref 30.0–36.0)
MCV: 100.5 fl — ABNORMAL HIGH (ref 78.0–100.0)
Platelets: 209 10*3/uL (ref 150.0–400.0)
RBC: 3.55 Mil/uL — ABNORMAL LOW (ref 3.87–5.11)
RDW: 14.8 % (ref 11.5–15.5)
WBC: 11 10*3/uL — ABNORMAL HIGH (ref 4.0–10.5)

## 2023-04-16 LAB — COMPREHENSIVE METABOLIC PANEL
ALT: 12 U/L (ref 0–35)
AST: 16 U/L (ref 0–37)
Albumin: 4.3 g/dL (ref 3.5–5.2)
Alkaline Phosphatase: 86 U/L (ref 39–117)
BUN: 32 mg/dL — ABNORMAL HIGH (ref 6–23)
CO2: 22 mEq/L (ref 19–32)
Calcium: 9.3 mg/dL (ref 8.4–10.5)
Chloride: 111 mEq/L (ref 96–112)
Creatinine, Ser: 2.14 mg/dL — ABNORMAL HIGH (ref 0.40–1.20)
GFR: 22.09 mL/min — ABNORMAL LOW (ref >60.00)
Glucose, Bld: 114 mg/dL — ABNORMAL HIGH (ref 70–99)
Potassium: 3.7 mEq/L (ref 3.5–5.1)
Sodium: 143 mEq/L (ref 135–145)
Total Bilirubin: 0.4 mg/dL (ref 0.2–1.2)
Total Protein: 7.2 g/dL (ref 6.0–8.3)

## 2023-04-16 LAB — URINALYSIS WITH CULTURE, IF INDICATED
Bilirubin Urine: NEGATIVE
Hgb urine dipstick: NEGATIVE
Ketones, ur: NEGATIVE
Nitrite: POSITIVE — AB
Specific Gravity, Urine: 1.025 (ref 1.000–1.030)
Urine Glucose: NEGATIVE
Urobilinogen, UA: 0.2 (ref 0.0–1.0)
pH: 6 (ref 5.0–8.0)

## 2023-04-16 MED ORDER — CIPROFLOXACIN HCL 500 MG PO TABS
500.0000 mg | ORAL_TABLET | Freq: Every day | ORAL | 0 refills | Status: AC
Start: 2023-04-16 — End: 2023-04-19

## 2023-04-16 NOTE — Assessment & Plan Note (Signed)
Treatment options are somewhat limited due to comorbidities and CKD. Referral to pain management made today.

## 2023-04-16 NOTE — Progress Notes (Signed)
Established Patient Office Visit  Subjective   Patient ID: [name], female    DOB: 09/11/1945  Age: 77 y.o. MRN: [id number]  Chief Complaint  Patient presents with   Back Pain    [name] is a 77 year old female with past medical history significant for anemia, hypertension, GERD, IBS, degenerative disc disease in the lumbar region, implanted cardioverter-defibrillator, prediabetes, CKD stage IV, coronary artery disease, ischemic cardiomyopathy, COPD, hyperlipidemia, thoracic aortic aneurysm, HFrEF.   She has longstanding history of back pain including having undergone surgery in the past.  She reports that she has been treated with meloxicam with improvement in pain in the past.  Unfortunately her most recent creatinine clearance is about 25.  Currently she will take Tylenol without much improvement in her pain.  She denies any sensory changes or weakness to her lower extremities.  Also denies any trauma to her back prior to current symptom onset.  She also reports that she has had frequent urinary tract infections.  Pain currently is located in her low back and is bilateral.  Does not radiate elsewhere.  She reports pain is constant and waxes and wanes in intensity getting up to as severe as 9/10. She also reports that she has chronic right hip pain. Has taken tramadol in the past, but not recently. Per PMDD last prescription was for a 20-day supply and was filled ~6 months ago.     Review of Systems  Respiratory:  Negative for cough.   Cardiovascular:  Negative for chest pain and palpitations.  Genitourinary:  Positive for dysuria. Negative for hematuria.  Musculoskeletal:  Positive for back pain. Negative for falls.  Neurological:  Negative for sensory change and weakness.      Objective:     BP 138/80   Pulse (!) 102   Temp 98.6 F (37 C) (Temporal)   Ht 5\' 2"  (1.575 m)   Wt 165 lb 8 oz (75.1 kg)   SpO2 93%   BMI 30.27 kg/m  BP Readings from Last 3 Encounters:   04/16/23 138/80  02/13/23 122/80  01/01/23 128/68   Wt Readings from Last 3 Encounters:  04/16/23 165 lb 8 oz (75.1 kg)  02/17/23 165 lb (74.8 kg)  02/13/23 165 lb (74.8 kg)      Physical Exam Vitals reviewed.  Constitutional:      General: She is not in acute distress.    Appearance: Normal appearance.  HENT:     Head: Normocephalic and atraumatic.  Neck:     Vascular: No carotid bruit.  Cardiovascular:     Rate and Rhythm: Normal rate and regular rhythm.     Pulses: Normal pulses.     Heart sounds: Normal heart sounds.  Pulmonary:     Effort: Pulmonary effort is normal.     Breath sounds: Normal breath sounds.  Musculoskeletal:     Thoracic back: Tenderness and bony tenderness present.     Lumbar back: Tenderness and bony tenderness present.  Skin:    General: Skin is warm and dry.  Neurological:     General: No focal deficit present.     Mental Status: She is alert and oriented to person, place, and time.  Psychiatric:        Mood and Affect: Mood normal.        Behavior: Behavior normal.        Judgment: Judgment normal.      No results found for any visits on 04/16/23.    The  ASCVD Risk score (Arnett DK, et al., 2019) failed to calculate for the following reasons:   The patient has a prior MI or stroke diagnosis    Assessment & Plan:   Problem List Items Addressed This Visit       Other   Low back pain without sciatica - Primary    Etiology unclear Will initiate work up with CBC, CMP, urine testing, xray.  Further recommendations may be made based on results. Recommend treatment with as needed tylenol for now.       Relevant Orders   Comprehensive metabolic panel   CBC   Urinalysis with Culture, if indicated   Urine Culture   DG Thoracic Spine 2 View   DG Lumbar Spine Complete   Ambulatory referral to Pain Clinic   Right hip pain    Treatment options are somewhat limited due to comorbidities and CKD. Referral to pain management made  today.       Relevant Orders   Ambulatory referral to Pain Clinic    Return in about 6 weeks (around 05/28/2023) for 4-6 weeks with PCP.    Tammy Paddy, NP

## 2023-04-16 NOTE — Progress Notes (Addendum)
Please call patient and let her know that her urine testing indicates UTI as well as kidney function decline.    I am going to prescribe her ciprofloxacin, she is to take 1 tablet by mouth once a day x 3 days.  She also should increase water intake over the next couple of days and to continue watching daily weights.  She should proceed to the ER over the weekend if her symptoms worsen she should also stop the antibiotic and notify us if she experiences severe abdominal pain/cramping or new joint pain. Her medication was sent to [facility].   She needs to return to our office on Monday morning to see Dr. Alvy Bimler for close monitoring including repeat labs to see if her kidney function has improved.  I have scheduled her for 10:20 AM with Dr. Alvy Bimler this upcoming Monday.  Please let me know if she has any questions.   I tried to call her and her son this evening to discuss the above, but neither answered. Please call them as soon as you are able to let them know that above. Thank you.

## 2023-04-16 NOTE — Assessment & Plan Note (Signed)
Etiology unclear Will initiate work up with CBC, CMP, urine testing, xray.  Further recommendations may be made based on results. Recommend treatment with as needed tylenol for now.

## 2023-04-17 ENCOUNTER — Telehealth: Payer: Self-pay | Admitting: Emergency Medicine

## 2023-04-17 ENCOUNTER — Other Ambulatory Visit: Payer: Self-pay | Admitting: Obstetrics and Gynecology

## 2023-04-17 DIAGNOSIS — R928 Other abnormal and inconclusive findings on diagnostic imaging of breast: Secondary | ICD-10-CM

## 2023-04-17 NOTE — Telephone Encounter (Signed)
Pt states she does not have medication Tramadol, and she's asking do she have a kidney infection. I read to her what was said but she would like for her nurse to call her.

## 2023-04-17 NOTE — Progress Notes (Signed)
Tried calling [name] was not able to leave message. Called patients son left voicemail to call back, for urinalysis results and to let her know of the appointment on 04/20/23 with Dr. Alvy Bimler. And to have labs done again

## 2023-04-18 LAB — URINE CULTURE

## 2023-04-20 ENCOUNTER — Encounter: Payer: Self-pay | Admitting: Emergency Medicine

## 2023-04-20 ENCOUNTER — Ambulatory Visit: Payer: 59 | Admitting: Emergency Medicine

## 2023-04-20 VITALS — BP 120/88 | HR 67 | Temp 97.9°F | Ht 62.0 in | Wt 168.1 lb

## 2023-04-20 DIAGNOSIS — M545 Low back pain, unspecified: Secondary | ICD-10-CM

## 2023-04-20 DIAGNOSIS — N390 Urinary tract infection, site not specified: Secondary | ICD-10-CM | POA: Diagnosis not present

## 2023-04-20 DIAGNOSIS — J449 Chronic obstructive pulmonary disease, unspecified: Secondary | ICD-10-CM

## 2023-04-20 DIAGNOSIS — I255 Ischemic cardiomyopathy: Secondary | ICD-10-CM | POA: Diagnosis not present

## 2023-04-20 DIAGNOSIS — R7303 Prediabetes: Secondary | ICD-10-CM

## 2023-04-20 DIAGNOSIS — N184 Chronic kidney disease, stage 4 (severe): Secondary | ICD-10-CM

## 2023-04-20 DIAGNOSIS — B9689 Other specified bacterial agents as the cause of diseases classified elsewhere: Secondary | ICD-10-CM | POA: Diagnosis not present

## 2023-04-20 DIAGNOSIS — I25119 Atherosclerotic heart disease of native coronary artery with unspecified angina pectoris: Secondary | ICD-10-CM

## 2023-04-20 DIAGNOSIS — I10 Essential (primary) hypertension: Secondary | ICD-10-CM | POA: Diagnosis not present

## 2023-04-20 DIAGNOSIS — G8929 Other chronic pain: Secondary | ICD-10-CM

## 2023-04-20 DIAGNOSIS — M48061 Spinal stenosis, lumbar region without neurogenic claudication: Secondary | ICD-10-CM

## 2023-04-20 DIAGNOSIS — I5020 Unspecified systolic (congestive) heart failure: Secondary | ICD-10-CM

## 2023-04-20 DIAGNOSIS — I712 Thoracic aortic aneurysm, without rupture, unspecified: Secondary | ICD-10-CM

## 2023-04-20 DIAGNOSIS — E785 Hyperlipidemia, unspecified: Secondary | ICD-10-CM

## 2023-04-20 LAB — URINALYSIS
Bilirubin Urine: NEGATIVE
Hgb urine dipstick: NEGATIVE
Ketones, ur: NEGATIVE
Leukocytes,Ua: NEGATIVE
Nitrite: NEGATIVE
Specific Gravity, Urine: 1.025 (ref 1.000–1.030)
Urine Glucose: NEGATIVE
Urobilinogen, UA: 0.2 (ref 0.0–1.0)
pH: 6 (ref 5.0–8.0)

## 2023-04-20 MED ORDER — CIPROFLOXACIN HCL 500 MG PO TABS
500.0000 mg | ORAL_TABLET | Freq: Every day | ORAL | 0 refills | Status: AC
Start: 2023-04-20 — End: 2023-04-25

## 2023-04-20 MED ORDER — KETOROLAC TROMETHAMINE 60 MG/2ML IM SOLN
60.0000 mg | Freq: Once | INTRAMUSCULAR | Status: AC
Start: 2023-04-20 — End: 2023-04-20
  Administered 2023-04-20: 60 mg via INTRAMUSCULAR

## 2023-04-20 MED ORDER — TRAMADOL HCL 50 MG PO TABS: 50.0000 mg | ORAL_TABLET | Freq: Every day | ORAL | 0 refills | Status: DC | PRN

## 2023-04-20 NOTE — Assessment & Plan Note (Signed)
Diet and nutrition discussed.

## 2023-04-20 NOTE — Telephone Encounter (Signed)
Patient was seen in the office on 9/23

## 2023-04-20 NOTE — Assessment & Plan Note (Signed)
BP Readings from Last 3 Encounters:  04/20/23 120/88  04/16/23 138/80  02/13/23 122/80  Well-controlled hypertension Continue bisoprolol 5 mg daily, hydralazine 10 mg 3 times a day

## 2023-04-20 NOTE — Assessment & Plan Note (Signed)
Stable chronic condition Continues Stiolto Respimat 2 puffs daily

## 2023-04-20 NOTE — Assessment & Plan Note (Signed)
Stable.  No complications.  Under surveillance.

## 2023-04-20 NOTE — Assessment & Plan Note (Signed)
Diet and nutrition discussed.  Continue rosuvastatin 20 mg daily.

## 2023-04-20 NOTE — Patient Instructions (Signed)
Urinary Tract Infection, Adult A urinary tract infection (UTI) is an infection of any part of the urinary tract. The urinary tract includes: The kidneys. The ureters. The bladder. The urethra. These organs make, store, and get rid of pee (urine) in the body. What are the causes? This infection is caused by germs (bacteria) in your genital area. These germs grow and cause swelling (inflammation) of your urinary tract. What increases the risk? The following factors may make you more likely to develop this condition: Using a small, thin tube (catheter) to drain pee. Not being able to control when you pee or poop (incontinence). Being female. If you are female, these things can increase the risk: Using these methods to prevent pregnancy: A medicine that kills sperm (spermicide). A device that blocks sperm (diaphragm). Having low levels of a female hormone (estrogen). Being pregnant. You are more likely to develop this condition if: You have genes that add to your risk. You are sexually active. You take antibiotic medicines. You have trouble peeing because of: A prostate that is bigger than normal, if you are female. A blockage in the part of your body that drains pee from the bladder. A kidney stone. A nerve condition that affects your bladder. Not getting enough to drink. Not peeing often enough. You have other conditions, such as: Diabetes. A weak disease-fighting system (immune system). Sickle cell disease. Gout. Injury of the spine. What are the signs or symptoms? Symptoms of this condition include: Needing to pee right away. Peeing small amounts often. Pain or burning when peeing. Blood in the pee. Pee that smells bad or not like normal. Trouble peeing. Pee that is cloudy. Fluid coming from the vagina, if you are female. Pain in the belly or lower back. Other symptoms include: Vomiting. Not feeling hungry. Feeling mixed up (confused). This may be the first symptom in  older adults. Being tired and grouchy (irritable). A fever. Watery poop (diarrhea). How is this treated? Taking antibiotic medicine. Taking other medicines. Drinking enough water. In some cases, you may need to see a specialist. Follow these instructions at home:  Medicines Take over-the-counter and prescription medicines only as told by your doctor. If you were prescribed an antibiotic medicine, take it as told by your doctor. Do not stop taking it even if you start to feel better. General instructions Make sure you: Pee until your bladder is empty. Do not hold pee for a long time. Empty your bladder after sex. Wipe from front to back after peeing or pooping if you are a female. Use each tissue one time when you wipe. Drink enough fluid to keep your pee pale yellow. Keep all follow-up visits. Contact a doctor if: You do not get better after 1-2 days. Your symptoms go away and then come back. Get help right away if: You have very bad back pain. You have very bad pain in your lower belly. You have a fever. You have chills. You feeling like you will vomit or you vomit. Summary A urinary tract infection (UTI) is an infection of any part of the urinary tract. This condition is caused by germs in your genital area. There are many risk factors for a UTI. Treatment includes antibiotic medicines. Drink enough fluid to keep your pee pale yellow. This information is not intended to replace advice given to you by your health care provider. Make sure you discuss any questions you have with your health care provider. Document Revised: 02/19/2020 Document Reviewed: 02/24/2020 Elsevier Patient Education  2024 [employer]

## 2023-04-20 NOTE — Assessment & Plan Note (Signed)
Chronic stable condition Follows up with nephrologist on a regular basis Advised to stay well-hydrated and avoid NSAIDs

## 2023-04-20 NOTE — Assessment & Plan Note (Signed)
Continues bisoprolol 5 mg daily and Lasix 40 mg daily Clinically stable.  No signs of acute CHF Normovolemic

## 2023-04-20 NOTE — Assessment & Plan Note (Signed)
No recent chest pains episodes Continues bisoprolol 5 mg daily and daily baby aspirin

## 2023-04-20 NOTE — Assessment & Plan Note (Signed)
Most recent UTI Seems to be responding to Cipro.  Finished 3 days Recommend to continue Cipro 500 mg daily for 5 days Urine culture repeated today

## 2023-04-20 NOTE — Assessment & Plan Note (Addendum)
Creating chronic lumbar pain Tramadol helps Advised to take sparingly as needed Advised to avoid NSAIDs Recently referred to pain management clinic

## 2023-04-20 NOTE — Assessment & Plan Note (Signed)
No recent anginal episodes. Continues bisoprolol 5 mg daily and daily baby aspirin

## 2023-04-20 NOTE — Progress Notes (Signed)
[name] 77 y.o.   Chief Complaint  Patient presents with   Medical Management of Chronic Issues    F/u appt, go over x ray results.     HISTORY OF PRESENT ILLNESS: This is a 77 y.o. female here for follow-up of office visit on 04/16/2023 when she was diagnosed with UTI X-rays showed chronic degenerative changes. Urine culture grew Klebsiella pneumonia.  She was started on Cipro and took for 3 days Blood work showed slight worsening of chronic kidney disease Feeling better today.  Has no complaints or any other medical concerns  HPI   Prior to Admission medications   Medication Sig Start Date End Date Taking? Authorizing Provider  acetaminophen (TYLENOL) 500 MG tablet Take 2 tablets (1,000 mg total) by mouth every 6 (six) hours as needed (back pain.). 08/11/22  Yes Redwine, Madison A, PA-C  amitriptyline (ELAVIL) 50 MG tablet TAKE 1 TABLET (50 MG TOTAL) BY MOUTH AT BEDTIME. 02/13/23  Yes Ladainian Therien, Eilleen Kempf, MD  aspirin 81 MG EC tablet Take 81 mg by mouth at bedtime.   Yes [provider]  bisoprolol (ZEBETA) 5 MG tablet TAKE 1 TABLET BY MOUTH DAILY. 12/29/22  Yes Tonny Bollman, MD  dexlansoprazole (DEXILANT) 60 MG capsule TAKE 1 CAPSULE (60 MG TOTAL) BY MOUTH DAILY. **PLEASE CALL OFFICE TO SCHEDULE FOLLOW UP 06/09/22  Yes Esterwood, Amy S, PA-C  furosemide (LASIX) 40 MG tablet Take 1 tablet (40 mg total) by mouth daily. Patient taking differently: Take 40 mg by mouth at bedtime. 12/09/21  Yes Weaver, Scott T, PA-C  hydrALAZINE (APRESOLINE) 10 MG tablet TAKE 1 TABLET (10 MG TOTAL) BY MOUTH 3 (THREE) TIMES DAILY. 09/12/22 09/12/23 Yes Weaver, Scott T, PA-C  polyethylene glycol powder (GLYCOLAX/MIRALAX) powder Take 17 g by mouth daily as needed for mild constipation. 07/08/17  Yes Lezlie Lye, Irma M, MD  rosuvastatin (CRESTOR) 20 MG tablet TAKE 1 TABLET (20 MG TOTAL) BY MOUTH AT BEDTIME. 12/29/22  Yes Tonny Bollman, MD  STIOLTO RESPIMAT 2.5-2.5 MCG/ACT AERS INHALE 2  PUFFS INTO THE LUNGS DAILY. NEED APPOINTMENT FOR FURTHER REFILLS 02/13/23  Yes Hunsucker, Lesia Sago, MD  topiramate (TOPAMAX) 50 MG tablet Take 1 tablet (50 mg total) by mouth daily. 11/24/22  Yes Casy Brunetto, Eilleen Kempf, MD  traMADol (ULTRAM) 50 MG tablet Take 1 tablet (50 mg total) by mouth daily as needed. 09/10/22 09/10/23 Yes Magnant, Joycie Peek, PA-C  PREMARIN vaginal cream Place 0.5 g vaginally daily as needed (irritation.). Patient not taking: Reported on 04/16/2023 10/03/22   [provider]    Allergies  Allergen Reactions   Sulfa Antibiotics Other (See Comments)    Unknown, childhood allergy    Sulfonamide Derivatives Other (See Comments)    UNSURE   Zestril [Lisinopril] Cough    Patient Active Problem List   Diagnosis Date Noted   Low back pain without sciatica 04/16/2023   Right hip pain 04/16/2023   Stage 4 chronic kidney disease (HCC) 11/25/2022   Pelvic pain 11/24/2022   Chronic bilateral low back pain without sciatica 05/08/2022   Spinal stenosis of lumbar region without neurogenic claudication 05/08/2022   HFrEF (heart failure with reduced ejection fraction) (HCC) 12/02/2021   Stage 3b chronic kidney disease (HCC) 08/08/2021   Chronic insomnia 11/14/2019   Colon polyp 06/16/2018   Thoracic aortic aneurysm without rupture (HCC) 03/31/2018   Chronic renal insufficiency    AICD (automatic cardioverter/defibrillator) present    Ischemic cardiomyopathy    COPD (chronic obstructive pulmonary disease) (HCC)  Hyperlipemia    Insomnia    Pre-diabetes 06/19/2017   Gastric polyp    Osteoporosis 05/11/2013   Hearing loss 04/29/2013   Mild cognitive impairment 04/29/2013   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   Implantable cardioverter-defibrillator (ICD) in situ 08/06/2011   DDD (degenerative disc disease), lumbar 05/16/2011   History of pulmonary embolus (PE) 01/02/2011   UNSPECIFIED ANEMIA 07/03/2010   Essential hypertension 09/05/2009    DIVERTICULOSIS-COLON 08/14/2009   PERSONAL HX COLONIC POLYPS 04/24/2009   TOBACCO ABUSE 04/04/2009   Coronary artery disease involving native coronary artery of native heart with angina pectoris (HCC) 02/25/2009   GERD 10/19/2007   IBS 10/19/2007   COLONIC POLYPS, ADENOMATOUS 05/12/2007    Past Medical History:  Diagnosis Date   Abdominal pain, left lower quadrant 08/14/2009   Qualifier: Diagnosis of  By: Dorian Pod, Pam     Abnormal CT scan, stomach    Abnormal LFTs 06/19/2017   AICD (automatic cardioverter/defibrillator) present    Dr.Allred follows   Aortic arch pseudoaneurysm (HCC)    a. followed by Dr. Donata Clay.   Arthritis    Asthma    Benign neoplasm of colon    CAD (coronary artery disease) 02/2009   a. anterior STEMI rx with BMS to prox LAD in 02/2009. b. ISR s/p PTCA 06/2009. c. ISR s/p thrombectomy & PTCA 2010-05-05 due to late stent thrombosis. // Myoview 04/2019: EF 28, ant, ant-sept, inf-sept scar, no ischemia, high risk (stable>>cont med Rx)    Cardiomyopathy, ischemic 06/12/2011   Chronic renal insufficiency    stage 3   Chronic systolic CHF (congestive heart failure) (HCC)    a. s/p ST. Jude ICD 05-May-2012.   Chronic systolic heart failure (HCC) 08/05/2011   Colon polyp    Complication of anesthesia    COPD (chronic obstructive pulmonary disease) (HCC)    CORONARY ARTERY DISEASE 04/24/2009   Qualifier: Diagnosis of  By: Myrtie Hawk, Amy S    DDD (degenerative disc disease), lumbar 05/16/2011   Depression    "since my son died in Dec 04, 2022" May 05, 2018)   Diverticulosis    DIVERTICULOSIS-COLON 08/14/2009   Qualifier: Diagnosis of  By: Monica Becton PA-c, Amy S    DYSPHAGIA UNSPECIFIED 04/24/2009   Qualifier: Diagnosis of  By: Dorian Pod, Pam     Dyspnea    "when I lay down at night"    Essential hypertension 09/05/2009   Qualifier: Diagnosis of  By: Yancey Flemings CMA, Jennifer     Gastric polyp    GERD 10/19/2007   Qualifier: Diagnosis of  By: Luberta Robertson RN, Eli Phillips:  Diagnosis of  By: Dorian Pod, Pam     GERD (gastroesophageal reflux disease)    GI bleed    a. h/o GIB on DAPT, now on ASA only.   Headache 01/03/2013   Hearing loss    left ear   History of pulmonary embolus (PE) 01/02/2011   Hyperlipemia    Hyperlipidemia, unspecified 04/24/2009   Qualifier: Diagnosis of  By: Monica Becton PA-c, Amy S    Hypertension    ICD-St.Jude 08/06/2011   S/p St. Jude ICD placement 08/05/11    Insomnia    Irritable bowel syndrome    Ischemic cardiomyopathy    EF 10-15%   Memory loss    Migraine headache without aura    Myocardial infarct [facility]) 2011 x 2   Dr. 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   Pulmonary embolism (HCC) 2011   Stroke [facility])    'When I was young." no residual   Thoracic aortic aneurysm (HCC) 03/31/2018   TOBACCO ABUSE 04/04/2009   Qualifier: Diagnosis of  By: Jolene Provost  Qualifier: Diagnosis of  By: Riley Kill, MD, Johny Sax    Transfusion history    ?'12 or '13   UNSPECIFIED ANEMIA 07/03/2010   Qualifier: Diagnosis of  By: Omer Jack     Unspecified mastoiditis     Past Surgical History:  Procedure Laterality Date   ANGIOPLASTY  07/02/09, 04/01/10   BILATERAL SALPINGOOPHORECTOMY     CAD( bare metal stent)  02/2009   x 1   CAROTID-SUBCLAVIAN BYPASS GRAFT Left 03/31/2018   Procedure: LEFT SUBCLAVIAN ARTERY BYPASS GRAFT;  Surgeon: Nada Libman, MD;  Location: [facility] OR;  Service: Vascular;  Laterality: Left;   CERVICAL SPINE SURGERY  08/08   COLONOSCOPY WITH PROPOFOL N/A 04/29/2016   Procedure: COLONOSCOPY WITH PROPOFOL;  Surgeon: Ruffin Frederick, MD;  Location: [facility] ENDOSCOPY;  Service: Gastroenterology;  Laterality: N/A;    ESOPHAGOGASTRODUODENOSCOPY N/A 12/09/2016   Procedure: ESOPHAGOGASTRODUODENOSCOPY (EGD);  Surgeon: Ruffin Frederick, MD;  Location: [facility];  Service: Gastroenterology;  Laterality: N/A;   ESOPHAGOGASTRODUODENOSCOPY (EGD) WITH PROPOFOL N/A 04/29/2016   Procedure: ESOPHAGOGASTRODUODENOSCOPY (EGD) WITH PROPOFOL;  Surgeon: Ruffin Frederick, MD;  Location: [facility];  Service: Gastroenterology;  Laterality: N/A;   EUS N/A 05/15/2016   Procedure: UPPER ENDOSCOPIC ULTRASOUND (EUS) RADIAL;  Surgeon: Rachael Fee, MD;  Location: [facility];  Service: Endoscopy;  Laterality: N/A;   ICD GENERATOR CHANGEOUT N/A 11/04/2022   Procedure: ICD GENERATOR CHANGEOUT;  Surgeon: Nelly Laurence, Roberts Gaudy, MD;  Location: [facility] INVASIVE CV LAB;  Service: Cardiovascular;  Laterality: N/A;   IMPLANTABLE CARDIOVERTER DEFIBRILLATOR IMPLANT N/A 08/05/2011   Primary prevention SJM ICD implanted,  Analyze ST study patient   INNER EAR SURGERY     left x 17   LUMBAR DISC SURGERY  02/2008   fusion   NASAL SEPTUM SURGERY     RIGHT HEART CATH N/A 12/11/2021   Procedure: RIGHT HEART CATH;  Surgeon: Tonny Bollman, MD;  Location: [facility] INVASIVE CV LAB;  Service: Cardiovascular;  Laterality: N/A;   THORACIC AORTIC ENDOVASCULAR STENT GRAFT N/A 03/31/2018   Procedure: THORACIC AORTIC ENDOVASCULAR STENT GRAFT;  Surgeon: Nada Libman, MD;  Location: [facility] OR;  Service: Vascular;  Laterality: N/A;   TOTAL ABDOMINAL HYSTERECTOMY     complete    Social History   Socioeconomic History   Marital status: Divorced    Spouse name: Not on file   Number of children: 2   Years of education: 11   Highest education level: 12th grade  Occupational History   Occupation: Designer, industrial/product: UNEMPLOYED    Employer: DISABLED  Tobacco Use   Smoking status: Former    Current packs/day: 0.13    Average packs/day: 0.1 packs/day for 30.0 years (3.9 ttl pk-yrs)    Types: Cigarettes   Smokeless tobacco: Never  Vaping Use   Vaping  status: Never Used  Substance and Sexual Activity   Alcohol use: No    Alcohol/week: 0.0 standard drinks of alcohol   Drug use: No   Sexual activity: Not Currently  Other Topics Concern  Not on file  Social History Narrative   Pt lives in [place] alone.  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   Patient has 11 th grade education.Right handed.   Caffeine- one cup daily         Social Determinants of Health   Financial Resource Strain: Low Risk  (02/17/2023)   Overall Financial Resource Strain (CARDIA)    Difficulty of Paying Living Expenses: Not very hard  Recent Concern: Financial Resource Strain - Medium Risk (11/20/2022)   Overall Financial Resource Strain (CARDIA)    Difficulty of Paying Living Expenses: Somewhat hard  Food Insecurity: No Food Insecurity (02/17/2023)   Hunger Vital Sign    Worried About Running Out of Food in the Last Year: Never true    Ran Out of Food in the Last Year: Never true  Transportation Needs: No Transportation Needs (02/17/2023)   PRAPARE - Administrator, Civil Service (Medical): No    Lack of Transportation (Non-Medical): No  Physical Activity: Inactive (02/17/2023)   Exercise Vital Sign    Days of Exercise per Week: 0 days    Minutes of Exercise per Session: 0 min  Stress: No Stress Concern Present (02/17/2023)   [employer] of Occupational Health - Occupational Stress Questionnaire    Feeling of Stress : Not at all  Recent Concern: Stress - Stress Concern Present (11/20/2022)   [employer] of Occupational Health - Occupational Stress Questionnaire    Feeling of Stress : To some extent  Social Connections: Socially Isolated (02/17/2023)   Social Connection and Isolation Panel [NHANES]    Frequency of Communication with Friends and Family: Never    Frequency of Social Gatherings with Friends and Family: Never    Attends Religious Services: 1 to 4 times per year    Active Member of [employer] or Organizations: No     Attends Banker Meetings: Never    Marital Status: Divorced  Catering manager Violence: Not At Risk (02/17/2023)   Humiliation, Afraid, Rape, and Kick questionnaire    Fear of Current or Ex-Partner: No    Emotionally Abused: No    Physically Abused: No    Sexually Abused: No    Family History  Problem Relation Age of Onset   Colon cancer Mother    Other Brother        tumor in his brain   Bladder Cancer Brother    Throat cancer Brother    Cancer Son        kidney, spread to his liver   Breast cancer Cousin      Review of Systems  Constitutional: Negative.  Negative for chills and fever.  HENT: Negative.  Negative for congestion and sore throat.   Respiratory: Negative.  Negative for cough and shortness of breath.   Cardiovascular: Negative.  Negative for chest pain and palpitations.  Gastrointestinal:  Negative for abdominal pain, nausea and vomiting.  Genitourinary:  Negative for dysuria and hematuria.  Musculoskeletal:  Positive for back pain.  Skin: Negative.  Negative for rash.  Neurological: Negative.  Negative for dizziness and headaches.  All other systems reviewed and are negative.   Vitals:   04/20/23 1006  BP: 120/88  Pulse: 67  Temp: 97.9 F (36.6 C)  SpO2: 93%    Physical Exam Vitals reviewed.  Constitutional:      Appearance: Normal appearance.  HENT:     Head: Normocephalic.     Mouth/Throat:     Mouth: Mucous membranes  are moist.     Pharynx: Oropharynx is clear.  Eyes:     Extraocular Movements: Extraocular movements intact.  Cardiovascular:     Rate and Rhythm: Normal rate and regular rhythm.     Pulses: Normal pulses.     Heart sounds: Normal heart sounds.  Pulmonary:     Effort: Pulmonary effort is normal.     Breath sounds: Normal breath sounds.  Abdominal:     Palpations: Abdomen is soft.     Tenderness: There is no abdominal tenderness.  Musculoskeletal:     Cervical back: No tenderness.  Lymphadenopathy:      Cervical: No cervical adenopathy.  Skin:    General: Skin is warm and dry.  Neurological:     Mental Status: She is alert and oriented to person, place, and time.  Psychiatric:        Mood and Affect: Mood normal.        Behavior: Behavior normal.      ASSESSMENT & PLAN: A total of 47 minutes was spent with the patient and counseling/coordination of care regarding preparing for this visit, review of most recent office visit notes, review of most recent blood work results, review of most recent urine culture result, review of multiple chronic medical conditions under management, review of all medications, education and nutrition, prognosis, documentation, and need for follow-up.  Problem List Items Addressed This Visit       Cardiovascular and Mediastinum   Coronary artery disease involving native coronary artery of native heart with angina pectoris (HCC) (Chronic)    No recent anginal episodes. Continues bisoprolol 5 mg daily and daily baby aspirin      HFrEF (heart failure with reduced ejection fraction) (HCC) (Chronic)    Continues bisoprolol 5 mg daily and Lasix 40 mg daily Clinically stable.  No signs of acute CHF Normovolemic      Essential hypertension    BP Readings from Last 3 Encounters:  04/20/23 120/88  04/16/23 138/80  02/13/23 122/80  Well-controlled hypertension Continue bisoprolol 5 mg daily, hydralazine 10 mg 3 times a day       Ischemic cardiomyopathy    No recent chest pains episodes Continues bisoprolol 5 mg daily and daily baby aspirin      Thoracic aortic aneurysm without rupture (HCC)    Stable.  No complications.  Under surveillance.        Respiratory   COPD (chronic obstructive pulmonary disease) (HCC)    Stable chronic condition Continues Stiolto Respimat 2 puffs daily        Genitourinary   UTI due to Klebsiella species - Primary    Most recent UTI Seems to be responding to Cipro.  Finished 3 days Recommend to continue Cipro 500  mg daily for 5 days Urine culture repeated today       Relevant Medications   ciprofloxacin (CIPRO) 500 MG tablet   Other Relevant Orders   Urine Culture   Urinalysis   Stage 4 chronic kidney disease (HCC)    Chronic stable condition Follows up with nephrologist on a regular basis Advised to stay well-hydrated and avoid NSAIDs        Other   Pre-diabetes    Diet and nutrition discussed      Dyslipidemia    Diet and nutrition discussed Continue rosuvastatin 20 mg daily      Chronic bilateral low back pain without sciatica   Relevant Medications   traMADol (ULTRAM) 50 MG tablet   Spinal stenosis  of lumbar region without neurogenic claudication    Creating chronic lumbar pain Tramadol helps Advised to take sparingly as needed Advised to avoid NSAIDs Recently referred to pain management clinic      Patient Instructions  Urinary Tract Infection, Adult A urinary tract infection (UTI) is an infection of any part of the urinary tract. The urinary tract includes: The kidneys. The ureters. The bladder. The urethra. These organs make, store, and get rid of pee (urine) in the body. What are the causes? This infection is caused by germs (bacteria) in your genital area. These germs grow and cause swelling (inflammation) of your urinary tract. What increases the risk? The following factors may make you more likely to develop this condition: Using a small, thin tube (catheter) to drain pee. Not being able to control when you pee or poop (incontinence). Being female. If you are female, these things can increase the risk: Using these methods to prevent pregnancy: A medicine that kills sperm (spermicide). A device that blocks sperm (diaphragm). Having low levels of a female hormone (estrogen). Being pregnant. You are more likely to develop this condition if: You have genes that add to your risk. You are sexually active. You take antibiotic medicines. You have trouble  peeing because of: A prostate that is bigger than normal, if you are female. A blockage in the part of your body that drains pee from the bladder. A kidney stone. A nerve condition that affects your bladder. Not getting enough to drink. Not peeing often enough. You have other conditions, such as: Diabetes. A weak disease-fighting system (immune system). Sickle cell disease. Gout. Injury of the spine. What are the signs or symptoms? Symptoms of this condition include: Needing to pee right away. Peeing small amounts often. Pain or burning when peeing. Blood in the pee. Pee that smells bad or not like normal. Trouble peeing. Pee that is cloudy. Fluid coming from the vagina, if you are female. Pain in the belly or lower back. Other symptoms include: Vomiting. Not feeling hungry. Feeling mixed up (confused). This may be the first symptom in older adults. Being tired and grouchy (irritable). A fever. Watery poop (diarrhea). How is this treated? Taking antibiotic medicine. Taking other medicines. Drinking enough water. In some cases, you may need to see a specialist. Follow these instructions at home:  Medicines Take over-the-counter and prescription medicines only as told by your doctor. If you were prescribed an antibiotic medicine, take it as told by your doctor. Do not stop taking it even if you start to feel better. General instructions Make sure you: Pee until your bladder is empty. Do not hold pee for a long time. Empty your bladder after sex. Wipe from front to back after peeing or pooping if you are a female. Use each tissue one time when you wipe. Drink enough fluid to keep your pee pale yellow. Keep all follow-up visits. Contact a doctor if: You do not get better after 1-2 days. Your symptoms go away and then come back. Get help right away if: You have very bad back pain. You have very bad pain in your lower belly. You have a fever. You have chills. You  feeling like you will vomit or you vomit. Summary A urinary tract infection (UTI) is an infection of any part of the urinary tract. This condition is caused by germs in your genital area. There are many risk factors for a UTI. Treatment includes antibiotic medicines. Drink enough fluid to keep your pee pale  yellow. This information is not intended to replace advice given to you by your health care provider. Make sure you discuss any questions you have with your health care provider. Document Revised: 02/19/2020 Document Reviewed: 02/24/2020 Elsevier Patient Education  2024 Elsevier Inc.     Edwina Barth, MD [facility]

## 2023-04-21 ENCOUNTER — Encounter: Payer: Self-pay | Admitting: Cardiovascular Disease

## 2023-04-22 LAB — URINE CULTURE: Result:: NO GROWTH

## 2023-04-28 ENCOUNTER — Encounter: Payer: Self-pay | Admitting: Physical Medicine and Rehabilitation

## 2023-05-05 ENCOUNTER — Other Ambulatory Visit: Payer: Self-pay | Admitting: Physician Assistant

## 2023-05-05 DIAGNOSIS — K219 Gastro-esophageal reflux disease without esophagitis: Secondary | ICD-10-CM

## 2023-05-07 ENCOUNTER — Ambulatory Visit (INDEPENDENT_AMBULATORY_CARE_PROVIDER_SITE_OTHER): Payer: 59

## 2023-05-07 DIAGNOSIS — I255 Ischemic cardiomyopathy: Secondary | ICD-10-CM | POA: Diagnosis not present

## 2023-05-07 LAB — CUP PACEART REMOTE DEVICE CHECK
Battery Remaining Longevity: 118 mo
Battery Remaining Percentage: 94 %
Battery Voltage: 3.04 V
Brady Statistic RV Percent Paced: 1 %
Date Time Interrogation Session: 20241010020154
HighPow Impedance: 84 Ohm
Implantable Lead Connection Status: 753985
Implantable Lead Implant Date: 20130108
Implantable Lead Location: 753860
Implantable Pulse Generator Implant Date: 20240409
Lead Channel Impedance Value: 530 Ohm
Lead Channel Pacing Threshold Amplitude: 0.75 V
Lead Channel Pacing Threshold Pulse Width: 0.5 ms
Lead Channel Sensing Intrinsic Amplitude: 12 mV
Lead Channel Setting Pacing Amplitude: 2.5 V
Lead Channel Setting Pacing Pulse Width: 0.5 ms
Lead Channel Setting Sensing Sensitivity: 0.5 mV
Pulse Gen Serial Number: 211016490
Zone Setting Status: 755011

## 2023-05-12 ENCOUNTER — Encounter: Payer: Self-pay | Admitting: Gastroenterology

## 2023-05-12 ENCOUNTER — Ambulatory Visit (INDEPENDENT_AMBULATORY_CARE_PROVIDER_SITE_OTHER): Payer: 59 | Admitting: Gastroenterology

## 2023-05-12 VITALS — BP 130/90 | HR 75 | Ht 62.0 in | Wt 168.0 lb

## 2023-05-12 DIAGNOSIS — K5900 Constipation, unspecified: Secondary | ICD-10-CM | POA: Diagnosis not present

## 2023-05-12 DIAGNOSIS — Z800 Family history of malignant neoplasm of digestive organs: Secondary | ICD-10-CM | POA: Diagnosis not present

## 2023-05-12 NOTE — Patient Instructions (Addendum)
Start eating more fruits and veggies, such as peaches, plums and pears.   Increase water intake.  It has been recommended to you by your physician that you have a(n) colonoscopy completed. Per your request, we did not schedule the procedure(s) today. Please contact our office at [phone] should you decide to have the procedure completed. You will be scheduled for a pre-visit and procedure at that time.  _______________________________________________________  If your blood pressure at your visit was 140/90 or greater, please contact your primary care physician to follow up on this.  _______________________________________________________  If you are age 22 or older, your body mass index should be between 23-30. Your Body mass index is 30.73 kg/m. If this is out of the aforementioned range listed, please consider follow up with your Primary Care Provider.  If you are age 65 or younger, your body mass index should be between 19-25. Your Body mass index is 30.73 kg/m. If this is out of the aformentioned range listed, please consider follow up with your Primary Care Provider.   ________________________________________________________  The [facility] providers would like to encourage you to use [facility] to communicate with providers for non-urgent requests or questions.  Due to long hold times on the telephone, sending your provider a message by [facility] may be a faster and more efficient way to get a response.  Please allow 48 business hours for a response.  Please remember that this is for non-urgent requests.  _______________________________________________________

## 2023-05-12 NOTE — Progress Notes (Signed)
05/12/2023 [name] [id number] 1945/10/17   HISTORY OF PRESENT ILLNESS: This is a 77 year old female who is a patient of Dr. Lanetta Inch.  She is here today to discuss constipation.  Last colonoscopy was in 07-16-2016 with findings of a single AVM in the right colon and a few diverticuli in the left colon as well as internal hemorrhoids.  There was a 3 mm polyp that was removed which was hyperplastic.  Constipation has been an ongoing issue for quite some time.  Says that she does not have a BM for 3-4 days at a time.  Says that if she takes MiraLAX then that gives her the runs and she has issues with incontinence.  Says the stool softeners did not really work.  Has been eating raisin bran cereal.  Looks like she was referred to pelvic floor physical therapy back in 2021/07/16, but did not did not ask her if she ever went.  She would like to try to manage it better by diet.  No rectal bleeding.  She actually has a family history of colon cancer in her mother.   Past Medical History:  Diagnosis Date   Abdominal pain, left lower quadrant 08/14/2009   Qualifier: Diagnosis of  By: Dorian Pod, Pam     Abnormal CT scan, stomach    Abnormal LFTs 06/19/2017   AICD (automatic cardioverter/defibrillator) present    Dr.Allred follows   Aortic arch pseudoaneurysm (HCC)    a. followed by Dr. Donata Clay.   Arthritis    Asthma    Benign neoplasm of colon    CAD (coronary artery disease) 02/2009   a. anterior STEMI rx with BMS to prox LAD in 02/2009. b. ISR s/p PTCA 06/2009. c. ISR s/p thrombectomy & PTCA 03/2010 due to late stent thrombosis. // Myoview 04/2019: EF 28, ant, ant-sept, inf-sept scar, no ischemia, high risk (stable>>cont med Rx)    Cardiomyopathy, ischemic 06/12/2011   Chronic renal insufficiency    stage 3   Chronic systolic CHF (congestive heart failure) (HCC)    a. s/p ST. Jude ICD July 16, 2012.   Chronic systolic heart failure (HCC) 08/05/2011   Colon polyp    Complication of anesthesia     COPD (chronic obstructive pulmonary disease) (HCC)    CORONARY ARTERY DISEASE 04/24/2009   Qualifier: Diagnosis of  By: Myrtie Hawk, Amy S    DDD (degenerative disc disease), lumbar 05/16/2011   Depression    "since my son died in Dec 15, 2022" 07/16/18)   Diverticulosis    DIVERTICULOSIS-COLON 08/14/2009   Qualifier: Diagnosis of  By: Monica Becton PA-c, Amy S    DYSPHAGIA UNSPECIFIED 04/24/2009   Qualifier: Diagnosis of  By: Dorian Pod, Pam     Dyspnea    "when I lay down at night"    Essential hypertension 09/05/2009   Qualifier: Diagnosis of  By: Yancey Flemings CMA, Jennifer     Gastric polyp    GERD 10/19/2007   Qualifier: Diagnosis of  By: Luberta Robertson RN, Eli Phillips: Diagnosis of  By: Dorian Pod, Pam     GERD (gastroesophageal reflux disease)    GI bleed    a. h/o GIB on DAPT, now on ASA only.   Headache 01/03/2013   Hearing loss    left ear   History of pulmonary embolus (PE) 01/02/2011   Hyperlipemia    Hyperlipidemia, unspecified 04/24/2009   Qualifier: Diagnosis of  By: Monica Becton PA-c, Amy S    Hypertension    ICD-St.Jude 08/06/2011  S/p St. Jude ICD placement 08/05/11    Insomnia    Irritable bowel syndrome    Ischemic cardiomyopathy    EF 10-15%   Memory loss    Migraine headache without aura    Myocardial infarct (HCC) 2011 x 2   Dr. 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   Pulmonary embolism (HCC) 2011   Stroke [facility])    'When I was young." no residual   Thoracic aortic aneurysm (HCC) 03/31/2018   TOBACCO ABUSE 04/04/2009   Qualifier: Diagnosis of  By: Jolene Provost  Qualifier: Diagnosis of  By: Riley Kill, MD, Johny Sax    Transfusion history    ?'12 or '13   UNSPECIFIED ANEMIA  07/03/2010   Qualifier: Diagnosis of  By: Omer Jack     Unspecified mastoiditis    Past Surgical History:  Procedure Laterality Date   ANGIOPLASTY  07/02/09, 04/01/10   BILATERAL SALPINGOOPHORECTOMY     CAD( bare metal stent)  02/2009   x 1   CAROTID-SUBCLAVIAN BYPASS GRAFT Left 03/31/2018   Procedure: LEFT SUBCLAVIAN ARTERY BYPASS GRAFT;  Surgeon: Nada Libman, MD;  Location: [facility] OR;  Service: Vascular;  Laterality: Left;   CERVICAL SPINE SURGERY  08/08   COLONOSCOPY WITH PROPOFOL N/A 04/29/2016   Procedure: COLONOSCOPY WITH PROPOFOL;  Surgeon: Ruffin Frederick, MD;  Location: [facility] ENDOSCOPY;  Service: Gastroenterology;  Laterality: N/A;   ESOPHAGOGASTRODUODENOSCOPY N/A 12/09/2016   Procedure: ESOPHAGOGASTRODUODENOSCOPY (EGD);  Surgeon: Ruffin Frederick, MD;  Location: [facility];  Service: Gastroenterology;  Laterality: N/A;   ESOPHAGOGASTRODUODENOSCOPY (EGD) WITH PROPOFOL N/A 04/29/2016   Procedure: ESOPHAGOGASTRODUODENOSCOPY (EGD) WITH PROPOFOL;  Surgeon: Ruffin Frederick, MD;  Location: [facility];  Service: Gastroenterology;  Laterality: N/A;   EUS N/A 05/15/2016   Procedure: UPPER ENDOSCOPIC ULTRASOUND (EUS) RADIAL;  Surgeon: Rachael Fee, MD;  Location: [facility];  Service: Endoscopy;  Laterality: N/A;   ICD GENERATOR CHANGEOUT N/A 11/04/2022   Procedure: ICD GENERATOR CHANGEOUT;  Surgeon: Nelly Laurence, Roberts Gaudy, MD;  Location: [facility] INVASIVE CV LAB;  Service: Cardiovascular;  Laterality: N/A;   IMPLANTABLE CARDIOVERTER DEFIBRILLATOR IMPLANT N/A 08/05/2011   Primary prevention SJM ICD implanted,  Analyze ST study patient   INNER EAR SURGERY     left x 17   LUMBAR DISC SURGERY  02/2008   fusion   NASAL SEPTUM SURGERY     RIGHT HEART CATH N/A 12/11/2021   Procedure: RIGHT HEART CATH;  Surgeon: Tonny Bollman, MD;  Location: [facility] INVASIVE CV LAB;  Service: Cardiovascular;  Laterality: N/A;   THORACIC AORTIC ENDOVASCULAR STENT GRAFT N/A 03/31/2018   Procedure: THORACIC  AORTIC ENDOVASCULAR STENT GRAFT;  Surgeon: Nada Libman, MD;  Location: [facility] OR;  Service: Vascular;  Laterality: N/A;   TOTAL ABDOMINAL HYSTERECTOMY     complete    reports that she has quit smoking. Her smoking use included cigarettes. She has a 3.9 pack-year smoking history. She has never used smokeless tobacco. She reports that she does not drink alcohol and does not use drugs. family history includes Bladder Cancer in her brother; Breast cancer in her cousin; Cancer in her son; Colon cancer in her mother; Other in her brother; Throat cancer in  her brother. Allergies  Allergen Reactions   Sulfa Antibiotics Other (See Comments)    Unknown, childhood allergy    Sulfonamide Derivatives Other (See Comments)    UNSURE   Zestril [Lisinopril] Cough      Outpatient Encounter Medications as of 05/12/2023  Medication Sig   acetaminophen (TYLENOL) 500 MG tablet Take 2 tablets (1,000 mg total) by mouth every 6 (six) hours as needed (back pain.).   amitriptyline (ELAVIL) 50 MG tablet TAKE 1 TABLET (50 MG TOTAL) BY MOUTH AT BEDTIME.   aspirin 81 MG EC tablet Take 81 mg by mouth at bedtime.   bisoprolol (ZEBETA) 5 MG tablet TAKE 1 TABLET BY MOUTH DAILY.   dexlansoprazole (DEXILANT) 60 MG capsule TAKE 1 CAPSULE (60 MG TOTAL) BY MOUTH DAILY. **PLEASE CALL OFFICE TO SCHEDULE FOLLOW UP   furosemide (LASIX) 40 MG tablet Take 1 tablet (40 mg total) by mouth daily. (Patient taking differently: Take 40 mg by mouth at bedtime.)   hydrALAZINE (APRESOLINE) 10 MG tablet TAKE 1 TABLET (10 MG TOTAL) BY MOUTH 3 (THREE) TIMES DAILY.   polyethylene glycol powder (GLYCOLAX/MIRALAX) powder Take 17 g by mouth daily as needed for mild constipation.   PREMARIN vaginal cream Place 0.5 g vaginally daily as needed (irritation.).   rosuvastatin (CRESTOR) 20 MG tablet TAKE 1 TABLET (20 MG TOTAL) BY MOUTH AT BEDTIME.   STIOLTO RESPIMAT 2.5-2.5 MCG/ACT AERS INHALE 2 PUFFS INTO THE LUNGS DAILY. NEED APPOINTMENT FOR FURTHER  REFILLS   topiramate (TOPAMAX) 50 MG tablet Take 1 tablet (50 mg total) by mouth daily.   traMADol (ULTRAM) 50 MG tablet Take 1 tablet (50 mg total) by mouth daily as needed for moderate pain or severe pain.   No facility-administered encounter medications on file as of 05/12/2023.    REVIEW OF SYSTEMS  : All other systems reviewed and negative except where noted in the History of Present Illness.   PHYSICAL EXAM: BP (!) 130/90   Pulse 75   Ht 5\' 2"  (1.575 m)   Wt 168 lb (76.2 kg)   BMI 30.73 kg/m  General: Well developed white female in no acute distress Head: Normocephalic and atraumatic Eyes:  Sclerae anicteric, conjunctiva pink. Ears: Normal auditory acuity Lungs: Clear throughout to auscultation; no W/R/R. Heart: Regular rate and rhythm; no M/R/G. Musculoskeletal: Symmetrical with no gross deformities  Skin: No lesions on visible extremities Neurological: Alert oriented x 4, grossly non-focal Psychological:  Alert and cooperative. Normal mood and affect  ASSESSMENT AND PLAN: *Constipation: Has been an ongoing issue for quite some time.  Says that if she takes MiraLAX then that gives her the runs and she has issues with incontinence.  Says the stool softeners did not really work.  Has been eating raisin bran cereal.  We discussed making sure she is drinking 40 to 60 ounces a water a day, increase her fruits and vegetables including fruits such as peaches, plums, pears, prunes, salads, etc.  We discussed possibly trying a dose of MiraLAX daily and a dose of Benefiber daily to help balance things and the Benefiber would help with bulking.  It looks like she was referred to pelvic floor physical therapy previously. *Family history of colon cancer in her mother: Her last colonoscopy was in 2017, should have been considered repeat in 2022.  Discussed that she is at fairly high risk due to her other medical problems, mostly cardiac, but those have been stable.  She is going to discuss  with her son call back to  schedule if she decides to proceed.  This will need to be done with Dr. Adela Lank at [facility] due to low EF.  Likely will need 2 day bowel prep.   CC:  Georgina Quint, [place]

## 2023-05-12 NOTE — Progress Notes (Signed)
Agree with assessment and plan as outlined?  Jess do you know how old her mother was when she had colon cancer? If dx later in life (> age 75) than I don't think she would need another colonoscopy. Thanks

## 2023-05-20 ENCOUNTER — Encounter: Payer: Self-pay | Admitting: Physical Medicine and Rehabilitation

## 2023-05-20 ENCOUNTER — Encounter: Payer: 59 | Attending: Physical Medicine and Rehabilitation | Admitting: Physical Medicine and Rehabilitation

## 2023-05-20 VITALS — BP 134/85 | HR 81 | Ht 62.0 in | Wt 168.2 lb

## 2023-05-20 DIAGNOSIS — G8929 Other chronic pain: Secondary | ICD-10-CM | POA: Diagnosis present

## 2023-05-20 DIAGNOSIS — Z5181 Encounter for therapeutic drug level monitoring: Secondary | ICD-10-CM | POA: Insufficient documentation

## 2023-05-20 DIAGNOSIS — M25551 Pain in right hip: Secondary | ICD-10-CM | POA: Diagnosis present

## 2023-05-20 DIAGNOSIS — G894 Chronic pain syndrome: Secondary | ICD-10-CM | POA: Insufficient documentation

## 2023-05-20 DIAGNOSIS — Z79891 Long term (current) use of opiate analgesic: Secondary | ICD-10-CM | POA: Insufficient documentation

## 2023-05-20 DIAGNOSIS — M545 Low back pain, unspecified: Secondary | ICD-10-CM | POA: Diagnosis not present

## 2023-05-20 MED ORDER — TRAMADOL HCL 50 MG PO TABS
50.0000 mg | ORAL_TABLET | Freq: Every day | ORAL | 0 refills | Status: DC | PRN
Start: 2023-05-20 — End: 2023-09-21

## 2023-05-20 MED ORDER — TRAMADOL HCL 50 MG PO TABS: 50.0000 mg | ORAL_TABLET | Freq: Every day | ORAL | 0 refills | Status: DC | PRN
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
## 2023-05-20 NOTE — Patient Instructions (Addendum)
I am sending you to physical therapy to work on your gait mechanics, stretching, strengthening, and range of motion to improve the pain control and functional goals that you have.  We discussed aqua therapy, however we are opting for land-based therapies because of your fear of water.  Today, you filled out a pain contract and had a urine drug screen.  I have refilled your tramadol 50 mg daily for 2 months.  If urine drug screen results are as expected, we will continue prescribing medication with ultimate goal to wean off of tramadol.  Alternatives would be to transition to a Butrans patch, given your poor kidney function.  Continue using a walker for stability and to help you get around into the community  Follow-up with me in 2 months.  I am also sending you to our interventionalists, Dr. Wynn Banker, to look into nonsteroid injection options for your right hip given severe reaction to steroids in the past.  TPI next visit if no improvement

## 2023-05-20 NOTE — Progress Notes (Signed)
Remote ICD transmission.   

## 2023-05-24 LAB — DRUG TOX MONITOR 1 W/CONF, ORAL FLD
Amphetamines: NEGATIVE ng/mL (ref <10)
Barbiturates: NEGATIVE ng/mL (ref <10)
Benzodiazepines: NEGATIVE ng/mL (ref <0.50)
Buprenorphine: NEGATIVE ng/mL (ref <0.10)
Cocaine: NEGATIVE ng/mL (ref <5.0)
Fentanyl: NEGATIVE ng/mL (ref <0.10)
Heroin Metabolite: NEGATIVE ng/mL (ref <1.0)
MARIJUANA: NEGATIVE ng/mL (ref <2.5)
MDMA: NEGATIVE ng/mL (ref <10)
Meprobamate: NEGATIVE ng/mL (ref <2.5)
Methadone: NEGATIVE ng/mL (ref <5.0)
Nicotine Metabolite: NEGATIVE ng/mL (ref <5.0)
Opiates: NEGATIVE ng/mL (ref <2.5)
Phencyclidine: NEGATIVE ng/mL (ref <10)
Tapentadol: NEGATIVE ng/mL (ref <5.0)
Tramadol: 83.9 ng/mL — ABNORMAL HIGH (ref <5.0)
Tramadol: POSITIVE ng/mL — AB (ref <5.0)
Zolpidem: NEGATIVE ng/mL (ref <5.0)

## 2023-05-24 LAB — DRUG TOX ALC METAB W/CON, ORAL FLD: Alcohol Metabolite: NEGATIVE ng/mL (ref <25)

## 2023-05-28 ENCOUNTER — Ambulatory Visit: Payer: 59 | Admitting: Emergency Medicine

## 2023-06-02 ENCOUNTER — Encounter: Payer: Self-pay | Admitting: Physical Medicine & Rehabilitation

## 2023-06-02 ENCOUNTER — Encounter: Payer: 59 | Attending: Physical Medicine & Rehabilitation | Admitting: Physical Medicine & Rehabilitation

## 2023-06-02 VITALS — BP 131/80 | HR 75 | Ht 62.0 in | Wt 168.0 lb

## 2023-06-02 DIAGNOSIS — M25551 Pain in right hip: Secondary | ICD-10-CM | POA: Diagnosis not present

## 2023-06-02 DIAGNOSIS — M961 Postlaminectomy syndrome, not elsewhere classified: Secondary | ICD-10-CM | POA: Diagnosis not present

## 2023-06-02 NOTE — Progress Notes (Signed)
Subjective:    Patient ID: [name], female    DOB: 1946/05/16, 77 y.o.   MRN: [id number]  HPI CC:  Low back and groin pain  77 yo female with lumbar post lami synd referred by Dr. Shearon Stalls to evaluate for hip injections that do not utilize corticosteroids. Hip/groin pain mainly at night with low back pain during ambulation , occ left ant thigh at night (not every night)  L4-S1 fusion per Dr Wynetta Emery  Pt goal to be off pain meds, pt with hx of IBS Patient gives a history of mental status changes during an  steroid injection of the hip on the right side in February 2024.  She states that she was offered another 1 by her orthopedic surgeon when she was last seen in August but she refused at that time. The injection performed 2/13 2024 was intra-articular hip injection under ultrasound guidance using 80 mg of methylprednisolone there is also a hip effusion that was aspirated at that time. Pain Inventory Average Pain 10 Pain Right Now 6 My pain is intermittent, sharp, and stabbing  In the last 24 hours, has pain interfered with the following? General activity 10 Relation with others 8 Enjoyment of life 0 What TIME of day is your pain at its worst? daytime and night Sleep (in general) Fair  Pain is worse with: walking, bending, inactivity, standing, and some activites Pain improves with: medication Relief from Meds: 8  Family History  Problem Relation Age of Onset   Colon cancer Mother    Other Brother        tumor in his brain   Bladder Cancer Brother    Throat cancer Brother    Cancer Son        kidney, spread to his liver   Breast cancer Cousin    Social History   Socioeconomic History   Marital status: Divorced    Spouse name: Not on file   Number of children: 2   Years of education: 11   Highest education level: 12th grade  Occupational History   Occupation: Designer, industrial/product: UNEMPLOYED    Employer: DISABLED  Tobacco Use   Smoking status: Former     Current packs/day: 0.13    Average packs/day: 0.1 packs/day for 30.0 years (3.9 ttl pk-yrs)    Types: Cigarettes   Smokeless tobacco: Never  Vaping Use   Vaping status: Never Used  Substance and Sexual Activity   Alcohol use: No    Alcohol/week: 0.0 standard drinks of alcohol   Drug use: No   Sexual activity: Not Currently  Other Topics Concern   Not on file  Social History Narrative   Pt lives in [place] alone.  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   Patient has 11 th grade education.Right handed.   Caffeine- one cup daily         Social Determinants of Health   Financial Resource Strain: Low Risk  (02/17/2023)   Overall Financial Resource Strain (CARDIA)    Difficulty of Paying Living Expenses: Not very hard  Recent Concern: Financial Resource Strain - Medium Risk (11/20/2022)   Overall Financial Resource Strain (CARDIA)    Difficulty of Paying Living Expenses: Somewhat hard  Food Insecurity: No Food Insecurity (02/17/2023)   Hunger Vital Sign    Worried About Running Out of Food in the Last Year: Never true    Ran Out of Food in the Last Year: Never true  Transportation Needs: No Transportation  Needs (02/17/2023)   PRAPARE - Administrator, Civil Service (Medical): No    Lack of Transportation (Non-Medical): No  Physical Activity: Inactive (02/17/2023)   Exercise Vital Sign    Days of Exercise per Week: 0 days    Minutes of Exercise per Session: 0 min  Stress: No Stress Concern Present (02/17/2023)   [employer] of Occupational Health - Occupational Stress Questionnaire    Feeling of Stress : Not at all  Recent Concern: Stress - Stress Concern Present (11/20/2022)   [employer] of Occupational Health - Occupational Stress Questionnaire    Feeling of Stress : To some extent  Social Connections: Socially Isolated (02/17/2023)   Social Connection and Isolation Panel [NHANES]    Frequency of Communication with Friends and Family: Never     Frequency of Social Gatherings with Friends and Family: Never    Attends Religious Services: 1 to 4 times per year    Active Member of [employer] or Organizations: No    Attends Banker Meetings: Never    Marital Status: Divorced   Past Surgical History:  Procedure Laterality Date   ANGIOPLASTY  07/02/09, 04/01/10   BILATERAL SALPINGOOPHORECTOMY     CAD( bare metal stent)  02/2009   x 1   CAROTID-SUBCLAVIAN BYPASS GRAFT Left 03/31/2018   Procedure: LEFT SUBCLAVIAN ARTERY BYPASS GRAFT;  Surgeon: Nada Libman, MD;  Location: [facility] OR;  Service: Vascular;  Laterality: Left;   CERVICAL SPINE SURGERY  08/08   COLONOSCOPY WITH PROPOFOL N/A 04/29/2016   Procedure: COLONOSCOPY WITH PROPOFOL;  Surgeon: Ruffin Frederick, MD;  Location: [facility];  Service: Gastroenterology;  Laterality: N/A;   ESOPHAGOGASTRODUODENOSCOPY N/A 12/09/2016   Procedure: ESOPHAGOGASTRODUODENOSCOPY (EGD);  Surgeon: Ruffin Frederick, MD;  Location: [facility];  Service: Gastroenterology;  Laterality: N/A;   ESOPHAGOGASTRODUODENOSCOPY (EGD) WITH PROPOFOL N/A 04/29/2016   Procedure: ESOPHAGOGASTRODUODENOSCOPY (EGD) WITH PROPOFOL;  Surgeon: Ruffin Frederick, MD;  Location: [facility];  Service: Gastroenterology;  Laterality: N/A;   EUS N/A 05/15/2016   Procedure: UPPER ENDOSCOPIC ULTRASOUND (EUS) RADIAL;  Surgeon: Rachael Fee, MD;  Location: [facility];  Service: Endoscopy;  Laterality: N/A;   ICD GENERATOR CHANGEOUT N/A 11/04/2022   Procedure: ICD GENERATOR CHANGEOUT;  Surgeon: Nelly Laurence, Roberts Gaudy, MD;  Location: [facility] INVASIVE CV LAB;  Service: Cardiovascular;  Laterality: N/A;   IMPLANTABLE CARDIOVERTER DEFIBRILLATOR IMPLANT N/A 08/05/2011   Primary prevention SJM ICD implanted,  Analyze ST study patient   INNER EAR SURGERY     left x 17   LUMBAR DISC SURGERY  02/2008   fusion   NASAL SEPTUM SURGERY     RIGHT HEART CATH N/A 12/11/2021   Procedure: RIGHT HEART CATH;  Surgeon: Tonny Bollman, MD;   Location: [facility] INVASIVE CV LAB;  Service: Cardiovascular;  Laterality: N/A;   THORACIC AORTIC ENDOVASCULAR STENT GRAFT N/A 03/31/2018   Procedure: THORACIC AORTIC ENDOVASCULAR STENT GRAFT;  Surgeon: Nada Libman, MD;  Location: [facility] OR;  Service: Vascular;  Laterality: N/A;   TOTAL ABDOMINAL HYSTERECTOMY     complete   Past Surgical History:  Procedure Laterality Date   ANGIOPLASTY  07/02/09, 04/01/10   BILATERAL SALPINGOOPHORECTOMY     CAD( bare metal stent)  02/2009   x 1   CAROTID-SUBCLAVIAN BYPASS GRAFT Left 03/31/2018   Procedure: LEFT SUBCLAVIAN ARTERY BYPASS GRAFT;  Surgeon: Nada Libman, MD;  Location: [facility] OR;  Service: Vascular;  Laterality: Left;   CERVICAL SPINE SURGERY  08/08   COLONOSCOPY  WITH PROPOFOL N/A 04/29/2016   Procedure: COLONOSCOPY WITH PROPOFOL;  Surgeon: Ruffin Frederick, MD;  Location: [facility];  Service: Gastroenterology;  Laterality: N/A;   ESOPHAGOGASTRODUODENOSCOPY N/A 12/09/2016   Procedure: ESOPHAGOGASTRODUODENOSCOPY (EGD);  Surgeon: Ruffin Frederick, MD;  Location: [facility];  Service: Gastroenterology;  Laterality: N/A;   ESOPHAGOGASTRODUODENOSCOPY (EGD) WITH PROPOFOL N/A 04/29/2016   Procedure: ESOPHAGOGASTRODUODENOSCOPY (EGD) WITH PROPOFOL;  Surgeon: Ruffin Frederick, MD;  Location: [facility];  Service: Gastroenterology;  Laterality: N/A;   EUS N/A 05/15/2016   Procedure: UPPER ENDOSCOPIC ULTRASOUND (EUS) RADIAL;  Surgeon: Rachael Fee, MD;  Location: [facility];  Service: Endoscopy;  Laterality: N/A;   ICD GENERATOR CHANGEOUT N/A 11/04/2022   Procedure: ICD GENERATOR CHANGEOUT;  Surgeon: Nelly Laurence, Roberts Gaudy, MD;  Location[facility] INVASIVE CV LAB;  Service: Cardiovascular;  Laterality: N/A;   IMPLANTABLE CARDIOVERTER DEFIBRILLATOR IMPLANT N/A 08/05/2011   Primary prevention SJM ICD implanted,  Analyze ST study patient   INNER EAR SURGERY     left x 17   LUMBAR DISC SURGERY  02/2008   fusion   NASAL SEPTUM SURGERY     RIGHT HEART  CATH N/A 12/11/2021   Procedure: RIGHT HEART CATH;  Surgeon: Tonny Bollman, MD;  Location: [facility] INVASIVE CV LAB;  Service: Cardiovascular;  Laterality: N/A;   THORACIC AORTIC ENDOVASCULAR STENT GRAFT N/A 03/31/2018   Procedure: THORACIC AORTIC ENDOVASCULAR STENT GRAFT;  Surgeon: Nada Libman, MD;  Location: [facility];  Service: Vascular;  Laterality: N/A;   TOTAL ABDOMINAL HYSTERECTOMY     complete   Past Medical History:  Diagnosis Date   Abdominal pain, left lower quadrant 08/14/2009   Qualifier: Diagnosis of  By: Dorian Pod, Pam     Abnormal CT scan, stomach    Abnormal LFTs 06/19/2017   AICD (automatic cardioverter/defibrillator) present    Dr.Allred follows   Aortic arch pseudoaneurysm (HCC)    a. followed by Dr. Donata Clay.   Arthritis    Asthma    Benign neoplasm of colon    CAD (coronary artery disease) 02/2009   a. anterior STEMI rx with BMS to prox LAD in 02/2009. b. ISR s/p PTCA 06/2009. c. ISR s/p thrombectomy & PTCA 03/2010 due to late stent thrombosis. // Myoview 04/2019: EF 28, ant, ant-sept, inf-sept scar, no ischemia, high risk (stable>>cont med Rx)    Cardiomyopathy, ischemic 06/12/2011   Chronic renal insufficiency    stage 3   Chronic systolic CHF (congestive heart failure) (HCC)    a. s/p ST. Jude ICD 06/09/12.   Chronic systolic heart failure (HCC) 08/05/2011   Colon polyp    Complication of anesthesia    COPD (chronic obstructive pulmonary disease) (HCC)    CORONARY ARTERY DISEASE 04/24/2009   Qualifier: Diagnosis of  By: Myrtie Hawk, Amy S    DDD (degenerative disc disease), lumbar 05/16/2011   Depression    "since my son died in November 08, 2022" 06-09-18)   Diverticulosis    DIVERTICULOSIS-COLON 08/14/2009   Qualifier: Diagnosis of  By: Monica Becton PA-c, Amy S    DYSPHAGIA UNSPECIFIED 04/24/2009   Qualifier: Diagnosis of  By: Dorian Pod, Pam     Dyspnea    "when I lay down at night"    Essential hypertension 09/05/2009   Qualifier: Diagnosis of  By: Yancey Flemings CMA, Jennifer      Gastric polyp    GERD 10/19/2007   Qualifier: Diagnosis of  By: Luberta Robertson RN, Eli Phillips: Diagnosis of  By: Dorian Pod, Pam  GERD (gastroesophageal reflux disease)    GI bleed    a. h/o GIB on DAPT, now on ASA only.   Headache 01/03/2013   Hearing loss    left ear   History of pulmonary embolus (PE) 01/02/2011   Hyperlipemia    Hyperlipidemia, unspecified 04/24/2009   Qualifier: Diagnosis of  By: Monica Becton PA-c, Amy S    Hypertension    ICD-St.Jude 08/06/2011   S/p St. Jude ICD placement 08/05/11    Insomnia    Irritable bowel syndrome    Ischemic cardiomyopathy    EF 10-15%   Memory loss    Migraine headache without aura    Myocardial infarct (HCC) 2011 x 2   Dr. 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   Pulmonary embolism (HCC) 2011   Stroke [facility])    'When I was young." no residual   Thoracic aortic aneurysm (HCC) 03/31/2018   TOBACCO ABUSE 04/04/2009   Qualifier: Diagnosis of  By: Jolene Provost  Qualifier: Diagnosis of  By: Riley Kill, MD, Johny Sax    Transfusion history    ?'12 or '13   UNSPECIFIED ANEMIA 07/03/2010   Qualifier: Diagnosis of  By: Omer Jack     Unspecified mastoiditis    Ht 5\' 2"  (1.575 m)   Wt 168 lb (76.2 kg)   BMI 30.73 kg/m   Opioid Risk Score:   Fall Risk Score:  `1  Depression screen PHQ 2/9     06/02/2023    2:28 PM 05/20/2023   11:26 AM 04/20/2023   10:07 AM 04/16/2023    2:45 PM 02/17/2023   11:37 AM 08/20/2022    2:16 PM 05/08/2022    1:45 PM  Depression screen PHQ 2/9  Decreased Interest 0 0 0 0 0 0 2  Down, Depressed, Hopeless 0 0 0 0 0 0 0  PHQ - 2 Score 0 0 0 0 0 0 2  Altered sleeping  3  1 3   0  Tired, decreased energy  0  0 0   2  Change in appetite  0  0 0  0  Feeling bad or failure about yourself   0  0 1  0  Trouble concentrating  3  0 1  2  Moving slowly or fidgety/restless  0  0 0  0  Suicidal thoughts  0  0 0  0  PHQ-9 Score  6  1 5  6   Difficult doing work/chores    Not difficult at all Somewhat difficult  Very difficult    Review of Systems  Musculoskeletal:  Positive for back pain.       Pain in both hips & left leg  All other systems reviewed and are negative.      Objective:   Physical Exam  Decreased internal and external rotation of both hips accompanied by pain mainly with internal rotation. Motor strength is 5/5 bilateral hip flexion knee extension ankle dorsiflexion Ambulates with a cane no evidence of toe drag or knee instability She is forward flexed in the lumbar area She has pain with lumbar extension greater than flexion.  Her pain is mostly at the lumbosacral junction.  Assessment & Plan:   1.  Hip pain with history of right hip effusion.  Overall this mainly bothers her at night.  Looking at prior notes she did actually tolerate the corticosteroid injection.  She is somewhat apprehensive about doing another 1 based on her experience.  She does think that it did help her with her pain over a prolonged period of time.  At this point I will hold off on recommending a hip intra-articular injection based on the fact that her pain only occurs at night and it does not keep her awake.  If this worsens would consider another hip injection other under fluoroscopic guidance or ultrasound guidance. Alternatively she could follow-up with Dr. August Saucer from orthopedics who is managing this before.  We discussed alternatives including PRP injection but given the private pay involved patient did not feel that this is financially feasible for her.  2.  Chronic low back pain her pain is mainly around the lumbar fusion site.  Medial branch blocks are not indicated in this area.  She is at risk for  sacroiliac pain.  She could probably tolerate a sacroiliac injection using a small steroid dose.  Unfortunately sacroiliac radiofrequency neurotomies are no longer covered by insurance.

## 2023-06-12 NOTE — Therapy (Unsigned)
OUTPATIENT PHYSICAL THERAPY THORACOLUMBAR EVALUATION   Patient Name: [name] MRN: [id number] DOB:[birth year], 77 y.o., female Today's Date: 06/15/2023  END OF SESSION:  PT End of Session - 06/15/23 1517     Visit Number 1    Number of Visits 12    Date for PT Re-Evaluation 08/15/23    Authorization Type MCR/MCD    PT Start Time 1430    PT Stop Time 1520    PT Time Calculation (min) 50 min    Activity Tolerance Patient limited by pain;Patient tolerated treatment well    Behavior During Therapy [facility] for tasks assessed/performed             Past Medical History:  Diagnosis Date   Abdominal pain, left lower quadrant 08/14/2009   Qualifier: Diagnosis of  By: Dorian Pod, Pam     Abnormal CT scan, stomach    Abnormal LFTs 06/19/2017   AICD (automatic cardioverter/defibrillator) present    Dr.Allred follows   Aortic arch pseudoaneurysm (HCC)    a. followed by Dr. Donata Clay.   Arthritis    Asthma    Benign neoplasm of colon    CAD (coronary artery disease) 02/2009   a. anterior STEMI rx with BMS to prox LAD in 02/2009. b. ISR s/p PTCA 06/2009. c. ISR s/p thrombectomy & PTCA 03/2010 due to late stent thrombosis. // Myoview 04/2019: EF 28, ant, ant-sept, inf-sept scar, no ischemia, high risk (stable>>cont med Rx)    Cardiomyopathy, ischemic 06/12/2011   Chronic renal insufficiency    stage 3   Chronic systolic CHF (congestive heart failure) (HCC)    a. s/p ST. Jude ICD 07/15/12.   Chronic systolic heart failure (HCC) 08/05/2011   Colon polyp    Complication of anesthesia    COPD (chronic obstructive pulmonary disease) (HCC)    CORONARY ARTERY DISEASE 04/24/2009   Qualifier: Diagnosis of  By: Myrtie Hawk, Amy S    DDD (degenerative disc disease), lumbar 05/16/2011   Depression    "since my son died in Dec 14, 2022" 15-Jul-2018)   Diverticulosis    DIVERTICULOSIS-COLON 08/14/2009   Qualifier: Diagnosis of  By: Monica Becton PA-c, Amy S    DYSPHAGIA UNSPECIFIED 04/24/2009   Qualifier:  Diagnosis of  By: Dorian Pod, Pam     Dyspnea    "when I lay down at night"    Essential hypertension 09/05/2009   Qualifier: Diagnosis of  By: Yancey Flemings CMA, Jennifer     Gastric polyp    GERD 10/19/2007   Qualifier: Diagnosis of  By: Luberta Robertson RN, Eli Phillips: Diagnosis of  By: Dorian Pod, Pam     GERD (gastroesophageal reflux disease)    GI bleed    a. h/o GIB on DAPT, now on ASA only.   Headache 01/03/2013   Hearing loss    left ear   History of pulmonary embolus (PE) 01/02/2011   Hyperlipemia    Hyperlipidemia, unspecified 04/24/2009   Qualifier: Diagnosis of  By: Monica Becton PA-c, Amy S    Hypertension    ICD-St.Jude 08/06/2011   S/p St. Jude ICD placement 08/05/11    Insomnia    Irritable bowel syndrome    Ischemic cardiomyopathy    EF 10-15%   Memory loss    Migraine headache without aura    Myocardial infarct (HCC) 2011 x 2   Dr. Cooper,cardiology    PERSONAL HX COLONIC POLYPS 04/24/2009   Qualifier: Diagnosis of  By: Peterson Ao 07-15-10 colonoscopy demonstrated a sessile  cecal polyp and sigmoid polyp    Pneumonia    PONV (postoperative nausea and vomiting)    Pre-diabetes    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   Pulmonary embolism (HCC) 2011   Stroke [facility])    'When I was young." no residual   Thoracic aortic aneurysm (HCC) 03/31/2018   TOBACCO ABUSE 04/04/2009   Qualifier: Diagnosis of  By: Jolene Provost  Qualifier: Diagnosis of  By: Riley Kill, MD, Johny Sax    Transfusion history    ?'12 or '13   UNSPECIFIED ANEMIA 07/03/2010   Qualifier: Diagnosis of  By: Omer Jack     Unspecified mastoiditis    Past Surgical History:  Procedure Laterality Date   ANGIOPLASTY  07/02/09, 04/01/10   BILATERAL SALPINGOOPHORECTOMY     CAD( bare metal stent)  02/2009   x 1   CAROTID-SUBCLAVIAN BYPASS GRAFT Left 03/31/2018   Procedure: LEFT SUBCLAVIAN ARTERY BYPASS GRAFT;  Surgeon: Nada Libman, MD;  Location: [facility] OR;  Service:  Vascular;  Laterality: Left;   CERVICAL SPINE SURGERY  08/08   COLONOSCOPY WITH PROPOFOL N/A 04/29/2016   Procedure: COLONOSCOPY WITH PROPOFOL;  Surgeon: Ruffin Frederick, MD;  Location: [facility] ENDOSCOPY;  Service: Gastroenterology;  Laterality: N/A;   ESOPHAGOGASTRODUODENOSCOPY N/A 12/09/2016   Procedure: ESOPHAGOGASTRODUODENOSCOPY (EGD);  Surgeon: Ruffin Frederick, MD;  Location: [facility];  Service: Gastroenterology;  Laterality: N/A;   ESOPHAGOGASTRODUODENOSCOPY (EGD) WITH PROPOFOL N/A 04/29/2016   Procedure: ESOPHAGOGASTRODUODENOSCOPY (EGD) WITH PROPOFOL;  Surgeon: Ruffin Frederick, MD;  Location: [facility];  Service: Gastroenterology;  Laterality: N/A;   EUS N/A 05/15/2016   Procedure: UPPER ENDOSCOPIC ULTRASOUND (EUS) RADIAL;  Surgeon: Rachael Fee, MD;  Location: [facility]COPY;  Service: Endoscopy;  Laterality: N/A;   ICD GENERATOR CHANGEOUT N/A 11/04/2022   Procedure: ICD GENERATOR CHANGEOUT;  Surgeon: Nelly Laurence, Roberts Gaudy, MD;  Location: [facility] INVASIVE CV LAB;  Service: Cardiovascular;  Laterality: N/A;   IMPLANTABLE CARDIOVERTER DEFIBRILLATOR IMPLANT N/A 08/05/2011   Primary prevention SJM ICD implanted,  Analyze ST study patient   INNER EAR SURGERY     left x 17   LUMBAR DISC SURGERY  02/2008   fusion   NASAL SEPTUM SURGERY     RIGHT HEART CATH N/A 12/11/2021   Procedure: RIGHT HEART CATH;  Surgeon: Tonny Bollman, MD;  Location: [facility] INVASIVE CV LAB;  Service: Cardiovascular;  Laterality: N/A;   THORACIC AORTIC ENDOVASCULAR STENT GRAFT N/A 03/31/2018   Procedure: THORACIC AORTIC ENDOVASCULAR STENT GRAFT;  Surgeon: Nada Libman, MD;  Location: [facility] OR;  Service: Vascular;  Laterality: N/A;   TOTAL ABDOMINAL HYSTERECTOMY     complete   Patient Active Problem List   Diagnosis Date Noted   Chronic pain syndrome 05/20/2023   Encounter for long-term opiate analgesic use 05/20/2023   Constipation 05/12/2023   Family history of colon cancer in mother 05/12/2023   Low  back pain without sciatica 04/16/2023   Right hip pain 04/16/2023   Stage 4 chronic kidney disease (HCC) 11/25/2022   Pelvic pain 11/24/2022   UTI due to Klebsiella species 05/08/2022   Chronic bilateral low back pain without sciatica 05/08/2022   Spinal stenosis of lumbar region without neurogenic claudication 05/08/2022   HFrEF (heart failure with reduced ejection fraction) (HCC) 12/02/2021   Stage 3b chronic kidney disease (HCC) 08/08/2021   Chronic insomnia 11/14/2019   Colon polyp 06/16/2018   Thoracic aortic aneurysm without rupture (HCC) 03/31/2018   Chronic renal insufficiency  AICD (automatic cardioverter/defibrillator) present    Ischemic cardiomyopathy    COPD (chronic obstructive pulmonary disease) (HCC)    Dyslipidemia    Insomnia    Pre-diabetes 06/19/2017   Gastric polyp    Osteoporosis 05/11/2013   Hearing loss 04/29/2013   Mild cognitive impairment 04/29/2013   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   Implantable cardioverter-defibrillator (ICD) in situ 08/06/2011   DDD (degenerative disc disease), lumbar 05/16/2011   History of pulmonary embolus (PE) 01/02/2011   UNSPECIFIED ANEMIA 07/03/2010   Essential hypertension 09/05/2009   DIVERTICULOSIS-COLON 08/14/2009   History of colonic polyps 04/24/2009   TOBACCO ABUSE 04/04/2009   Coronary artery disease involving native coronary artery of native heart with angina pectoris (HCC) 02/25/2009   GERD 10/19/2007   IBS 10/19/2007   COLONIC POLYPS, ADENOMATOUS 05/12/2007    PCP: Georgina Quint, MD   REFERRING PROVIDER: Angelina Sheriff, DO  REFERRING DIAG: [phone] (ICD-10-CM) - Chronic bilateral low back pain without sciatica M25.551 (ICD-10-CM) - Right hip pain  Rationale for Evaluation and Treatment: Rehabilitation  THERAPY DIAG:  Other abnormalities of gait and mobility  Muscle weakness (generalized)  Other low back pain  ONSET DATE: cronic  SUBJECTIVE:                                                                                                                                                                                            SUBJECTIVE STATEMENT: Describes a history of chronic back pain, treated with pain medication, referred to pain management.  Unable to undergo ESI's due to adverse reaction.  Underwent lumbar surgery in 2009 with no further issues.  Low back began around 9 months ago, insidious onset.  Unable to describe distinct aggravating or relieving factors.  Has been referred to pain management to manage low back symptoms.  PERTINENT HISTORY:  Chronic bilateral low back pain without sciatica   I am sending you to physical therapy to work on your gait mechanics, stretching, strengthening, and range of motion to improve the pain control and functional goals that you have.  We discussed aqua therapy, however we are opting for land-based therapies because of your fear of water.   Continue using a walker for stability and to help you get around into the community   TPI next visit if no improvement   Right hip pain I am also sending you to our interventionalists, Dr. Wynn Banker, to look into nonsteroid injection options for your right hip given severe reaction to steroids in the past.  PAIN:  Are you having pain? Yes: NPRS scale: 10/10 Pain location: low back Pain description: ache Aggravating factors:  activity Relieving factors: heat pad and medication  PRECAUTIONS: None  RED FLAGS: None   WEIGHT BEARING RESTRICTIONS: No  FALLS:  Has patient fallen in last 6 months? No  OCCUPATION: retired  PLOF: Independent  PATIENT GOALS: To get more steady on my feet  NEXT MD VISIT: 4 weeks  OBJECTIVE:  Note: Objective measures were completed at Evaluation unless otherwise noted.  DIAGNOSTIC FINDINGS:  IMPRESSION: 1. No acute findings. 2. Moderate spondylosis of the lumbar spine with moderate disc disease at the L3-4 level. 3. Posterior  fusion hardware from L4-S1 with interbody fusion at the L4-5 and L5-S1 levels.     Electronically Signed   By: Elberta Fortis M.D.   On: 04/16/2023 16:45  PATIENT SURVEYS:  FOTO 32(40 predicted)  MUSCLE LENGTH: Hamstrings: Right 70 deg; Left 70 deg Thomas test: not tested due to pain  POSTURE: rounded shoulders and decreased lumbar lordosis  PALPATION: Increased tension throughout lumbar and thoracic paraspinals  LUMBAR ROM:   AROM eval  Flexion   Extension   Right lateral flexion   Left lateral flexion   Right rotation   Left rotation    (Blank rows = not tested)  LOWER EXTREMITY ROM:     Active  Right eval Left eval  Hip flexion    Hip extension    Hip abduction    Hip adduction    Hip internal rotation    Hip external rotation    Knee flexion    Knee extension    Ankle dorsiflexion    Ankle plantarflexion    Ankle inversion    Ankle eversion     (Blank rows = not tested)  LOWER EXTREMITY MMT:    MMT Right eval Left eval  Hip flexion    Hip extension    Hip abduction    Hip adduction    Hip internal rotation    Hip external rotation    Knee flexion    Knee extension    Ankle dorsiflexion    Ankle plantarflexion    Ankle inversion    Ankle eversion     (Blank rows = not tested)  LUMBAR SPECIAL TESTS:  Straight leg raise test: Negative and Slump test: Negative SLR performed in sitting  FUNCTIONAL TESTS:  30 seconds chair stand test 6 reps with UE assist  GAIT: Distance walked: 85ft x2 Assistive device utilized: None Level of assistance: Complete Independence Comments: unremarkable  TODAY'S TREATMENT:                                                                                                                              DATE: 06/15/23 Eval    PATIENT EDUCATION:  Education details: Discussed eval findings, rehab rationale and POC and patient is in agreement  Person educated: Patient Education method: Explanation Education  comprehension: verbalized understanding and needs further education  HOME EXERCISE PROGRAM: Access Code: [email or web address] URL: [email or web address] Date: 06/15/2023 Prepared by: Gustavus Bryant  Exercises -  Supine Lower Trunk Rotation  - 2 x daily - 5 x weekly - 1 sets - 2 reps - 30s hold - Supine March  - 2 x daily - 5 x weekly - 1 sets - 10 reps - Heel Toe Raises with Counter Support  - 2 x daily - 5 x weekly - 1 sets - 10 reps  ASSESSMENT:  CLINICAL IMPRESSION: Patient is a 77 y.o. female who was seen today for physical therapy evaluation and treatment for low back pain. Symptoms onset 9 months ago insidiously.  Patient denies radicular symptoms but does endorse B hip pain due to OA.  SLR and slump test negative.  Trunk mobility limited due to pain and spasm.  Denied balance issues or recent falls.  Prior hx of L4-S1 fusion.  Rehab potential is fair based on pain levels and kinesiophobia  OBJECTIVE IMPAIRMENTS: decreased activity tolerance, decreased knowledge of condition, decreased mobility, decreased ROM, increased muscle spasms, improper body mechanics, postural dysfunction, and pain.   ACTIVITY LIMITATIONS: carrying, lifting, bending, sitting, standing, stairs, and bed mobility  PERSONAL FACTORS: Age, Fitness, Past/current experiences, and Time since onset of injury/illness/exacerbation are also affecting patient's functional outcome.   REHAB POTENTIAL: fair  CLINICAL DECISION MAKING: Stable/uncomplicated  EVALUATION COMPLEXITY: Low   GOALS: Goals reviewed with patient? No  SHORT TERM GOALS: Target date: 07/06/2023    Patient to demonstrate independence in HEP  Baseline: [id number] Goal status: INITIAL  2.  Assess 2 MW for baseline and set appropriate goal Baseline: TBD Goal status: INITIAL    LONG TERM GOALS: Target date: 07/27/2023    Patient will acknowledge 6/10 pain at least once during episode of care   Baseline: 10/10 Goal status: INITIAL  2.   Patient will score at least 40% on FOTO to signify clinically meaningful improvement in functional abilities.   Baseline: 32 Goal status: INITIAL  3.  Patient will increase 30s chair stand reps from 6 to 8 with/without arms to demonstrate and improved functional ability with less pain/difficulty as well as reduce fall risk.  Baseline: 6 Goal status: INITIAL  4.  Assess progress towards 2 MWT goal Baseline: TBD Goal status: INITIAL    PLAN:  PT FREQUENCY: 1-2x/week  PT DURATION: 6 weeks  PLANNED INTERVENTIONS: 97164- PT Re-evaluation, 97110-Therapeutic exercises, 97530- Therapeutic activity, 97112- Neuromuscular re-education, 97535- Self Care, [id number]- Manual therapy, 97116- Gait training, Dry Needling, Joint mobilization, Spinal mobilization, Cryotherapy, and Moist heat.  PLAN FOR NEXT SESSION: HEP review and update, manual techniques as appropriate, aerobic tasks, ROM and flexibility activities, strengthening and PREs, TPDN, gait and balance training as needed     Hildred Laser, PT 06/15/2023, 3:20 PM

## 2023-06-15 ENCOUNTER — Other Ambulatory Visit: Payer: Self-pay

## 2023-06-15 ENCOUNTER — Ambulatory Visit: Payer: 59 | Attending: Physical Medicine and Rehabilitation

## 2023-06-15 DIAGNOSIS — M5459 Other low back pain: Secondary | ICD-10-CM | POA: Diagnosis present

## 2023-06-15 DIAGNOSIS — G8929 Other chronic pain: Secondary | ICD-10-CM | POA: Diagnosis not present

## 2023-06-15 DIAGNOSIS — R2689 Other abnormalities of gait and mobility: Secondary | ICD-10-CM | POA: Diagnosis present

## 2023-06-15 DIAGNOSIS — M6281 Muscle weakness (generalized): Secondary | ICD-10-CM | POA: Insufficient documentation

## 2023-06-15 DIAGNOSIS — M25551 Pain in right hip: Secondary | ICD-10-CM | POA: Diagnosis not present

## 2023-06-15 DIAGNOSIS — M545 Low back pain, unspecified: Secondary | ICD-10-CM | POA: Diagnosis not present

## 2023-06-29 NOTE — Therapy (Unsigned)
OUTPATIENT PHYSICAL THERAPY THORACOLUMBAR EVALUATION   Patient Name: [name] MRN: [id number] DOB:09-27-1945, 77 y.o., female Today's Date: 06/30/2023  END OF SESSION:  PT End of Session - 06/30/23 1443     Visit Number 2    Number of Visits 12    Date for PT Re-Evaluation 08/15/23    Authorization Type MCR/MCD    PT Start Time 1400    PT Stop Time 1435    PT Time Calculation (min) 35 min    Activity Tolerance Patient limited by pain;Patient tolerated treatment well    Behavior During Therapy [facility] for tasks assessed/performed              Past Medical History:  Diagnosis Date   Abdominal pain, left lower quadrant 08/14/2009   Qualifier: Diagnosis of  By: Dorian Pod, Pam     Abnormal CT scan, stomach    Abnormal LFTs 06/19/2017   AICD (automatic cardioverter/defibrillator) present    Dr.Allred follows   Aortic arch pseudoaneurysm (HCC)    a. followed by Dr. Donata Clay.   Arthritis    Asthma    Benign neoplasm of colon    CAD (coronary artery disease) 02/2009   a. anterior STEMI rx with BMS to prox LAD in 02/2009. b. ISR s/p PTCA Jul 26, 2009. c. ISR s/p thrombectomy & PTCA 03/2010 due to late stent thrombosis. // Myoview 04/2019: EF 28, ant, ant-sept, inf-sept scar, no ischemia, high risk (stable>>cont med Rx)    Cardiomyopathy, ischemic 06/12/2011   Chronic renal insufficiency    stage 3   Chronic systolic CHF (congestive heart failure) (HCC)    a. s/p ST. Jude ICD 07/26/2012.   Chronic systolic heart failure (HCC) 08/05/2011   Colon polyp    Complication of anesthesia    COPD (chronic obstructive pulmonary disease) (HCC)    CORONARY ARTERY DISEASE 04/24/2009   Qualifier: Diagnosis of  By: Myrtie Hawk, Amy S    DDD (degenerative disc disease), lumbar 05/16/2011   Depression    "since my son died in November 25, 2022" 2018/07/26)   Diverticulosis    DIVERTICULOSIS-COLON 08/14/2009   Qualifier: Diagnosis of  By: Monica Becton PA-c, Amy S    DYSPHAGIA UNSPECIFIED 04/24/2009   Qualifier:  Diagnosis of  By: Dorian Pod, Pam     Dyspnea    "when I lay down at night"    Essential hypertension 09/05/2009   Qualifier: Diagnosis of  By: Yancey Flemings CMA, Jennifer     Gastric polyp    GERD 10/19/2007   Qualifier: Diagnosis of  By: Luberta Robertson RN, Eli Phillips: Diagnosis of  By: Dorian Pod, Pam     GERD (gastroesophageal reflux disease)    GI bleed    a. h/o GIB on DAPT, now on ASA only.   Headache 01/03/2013   Hearing loss    left ear   History of pulmonary embolus (PE) 01/02/2011   Hyperlipemia    Hyperlipidemia, unspecified 04/24/2009   Qualifier: Diagnosis of  By: Monica Becton PA-c, Amy S    Hypertension    ICD-St.Jude 08/06/2011   S/p St. Jude ICD placement 08/05/11    Insomnia    Irritable bowel syndrome    Ischemic cardiomyopathy    EF 10-15%   Memory loss    Migraine headache without aura    Myocardial infarct (HCC) 2011 x 2   Dr. Cooper,cardiology    PERSONAL HX COLONIC POLYPS 04/24/2009   Qualifier: Diagnosis of  By: Peterson Ao November, 2011 colonoscopy demonstrated a  sessile cecal polyp and sigmoid polyp    Pneumonia    PONV (postoperative nausea and vomiting)    Pre-diabetes    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   Pulmonary embolism (HCC) 2011   Stroke [facility])    'When I was young." no residual   Thoracic aortic aneurysm (HCC) 03/31/2018   TOBACCO ABUSE 04/04/2009   Qualifier: Diagnosis of  By: Jolene Provost  Qualifier: Diagnosis of  By: Riley Kill, MD, Johny Sax    Transfusion history    ?'12 or '13   UNSPECIFIED ANEMIA 07/03/2010   Qualifier: Diagnosis of  By: Omer Jack     Unspecified mastoiditis    Past Surgical History:  Procedure Laterality Date   ANGIOPLASTY  07/02/09, 04/01/10   BILATERAL SALPINGOOPHORECTOMY     CAD( bare metal stent)  02/2009   x 1   CAROTID-SUBCLAVIAN BYPASS GRAFT Left 03/31/2018   Procedure: LEFT SUBCLAVIAN ARTERY BYPASS GRAFT;  Surgeon: Nada Libman, MD;  Location: [facility] OR;  Service:  Vascular;  Laterality: Left;   CERVICAL SPINE SURGERY  08/08   COLONOSCOPY WITH PROPOFOL N/A 04/29/2016   Procedure: COLONOSCOPY WITH PROPOFOL;  Surgeon: Ruffin Frederick, MD;  Location: [facility] ENDOSCOPY;  Service: Gastroenterology;  Laterality: N/A;   ESOPHAGOGASTRODUODENOSCOPY N/A 12/09/2016   Procedure: ESOPHAGOGASTRODUODENOSCOPY (EGD);  Surgeon: Ruffin Frederick, MD;  Location: [facility];  Service: Gastroenterology;  Laterality: N/A;   ESOPHAGOGASTRODUODENOSCOPY (EGD) WITH PROPOFOL N/A 04/29/2016   Procedure: ESOPHAGOGASTRODUODENOSCOPY (EGD) WITH PROPOFOL;  Surgeon: Ruffin Frederick, MD;  Location: [facility] ENDOSCOPY;  Service: Gastroenterology;  Laterality: N/A;   EUS N/A 05/15/2016   Procedure: UPPER ENDOSCOPIC ULTRASOUND (EUS) RADIAL;  Surgeon: Rachael Fee, MD;  Location: [facility] ENDOSCOPY;  Service: Endoscopy;  Laterality: N/A;   ICD GENERATOR CHANGEOUT N/A 11/04/2022   Procedure: ICD GENERATOR CHANGEOUT;  Surgeon: Nelly Laurence, Roberts Gaudy, MD;  Location: [facility] INVASIVE CV LAB;  Service: Cardiovascular;  Laterality: N/A;   IMPLANTABLE CARDIOVERTER DEFIBRILLATOR IMPLANT N/A 08/05/2011   Primary prevention SJM ICD implanted,  Analyze ST study patient   INNER EAR SURGERY     left x 17   LUMBAR DISC SURGERY  02/2008   fusion   NASAL SEPTUM SURGERY     RIGHT HEART CATH N/A 12/11/2021   Procedure: RIGHT HEART CATH;  Surgeon: Tonny Bollman, MD;  Location: [facility] INVASIVE CV LAB;  Service: Cardiovascular;  Laterality: N/A;   THORACIC AORTIC ENDOVASCULAR STENT GRAFT N/A 03/31/2018   Procedure: THORACIC AORTIC ENDOVASCULAR STENT GRAFT;  Surgeon: Nada Libman, MD;  Location: [facility] OR;  Service: Vascular;  Laterality: N/A;   TOTAL ABDOMINAL HYSTERECTOMY     complete   Patient Active Problem List   Diagnosis Date Noted   Chronic pain syndrome 05/20/2023   Encounter for long-term opiate analgesic use 05/20/2023   Constipation 05/12/2023   Family history of colon cancer in mother 05/12/2023   Low  back pain without sciatica 04/16/2023   Right hip pain 04/16/2023   Stage 4 chronic kidney disease (HCC) 11/25/2022   Pelvic pain 11/24/2022   UTI due to Klebsiella species 05/08/2022   Chronic bilateral low back pain without sciatica 05/08/2022   Spinal stenosis of lumbar region without neurogenic claudication 05/08/2022   HFrEF (heart failure with reduced ejection fraction) (HCC) 12/02/2021   Stage 3b chronic kidney disease (HCC) 08/08/2021   Chronic insomnia 11/14/2019   Colon polyp 06/16/2018   Thoracic aortic aneurysm without rupture (HCC) 03/31/2018   Chronic renal insufficiency  AICD (automatic cardioverter/defibrillator) present    Ischemic cardiomyopathy    COPD (chronic obstructive pulmonary disease) (HCC)    Dyslipidemia    Insomnia    Pre-diabetes 06/19/2017   Gastric polyp    Osteoporosis 05/11/2013   Hearing loss 04/29/2013   Mild cognitive impairment 04/29/2013   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   Implantable cardioverter-defibrillator (ICD) in situ 08/06/2011   DDD (degenerative disc disease), lumbar 05/16/2011   History of pulmonary embolus (PE) 01/02/2011   UNSPECIFIED ANEMIA 07/03/2010   Essential hypertension 09/05/2009   DIVERTICULOSIS-COLON 08/14/2009   History of colonic polyps 04/24/2009   TOBACCO ABUSE 04/04/2009   Coronary artery disease involving native coronary artery of native heart with angina pectoris (HCC) 02/25/2009   GERD 10/19/2007   IBS 10/19/2007   COLONIC POLYPS, ADENOMATOUS 05/12/2007    PCP: Georgina Quint, MD   REFERRING PROVIDER: Angelina Sheriff, DO  REFERRING DIAG: [phone] (ICD-10-CM) - Chronic bilateral low back pain without sciatica M25.551 (ICD-10-CM) - Right hip pain  Rationale for Evaluation and Treatment: Rehabilitation  THERAPY DIAG:  Other abnormalities of gait and mobility  Muscle weakness (generalized)  Other low back pain  ONSET DATE: cronic  SUBJECTIVE:                                                                                                                                                                                            SUBJECTIVE STATEMENT: Has been compliant with HEP   PERTINENT HISTORY:  Chronic bilateral low back pain without sciatica   I am sending you to physical therapy to work on your gait mechanics, stretching, strengthening, and range of motion to improve the pain control and functional goals that you have.  We discussed aqua therapy, however we are opting for land-based therapies because of your fear of water.   Continue using a walker for stability and to help you get around into the community   TPI next visit if no improvement   Right hip pain I am also sending you to our interventionalists, Dr. Wynn Banker, to look into nonsteroid injection options for your right hip given severe reaction to steroids in the past.  PAIN:  Are you having pain? Yes: NPRS scale: 10/10 Pain location: low back Pain description: ache Aggravating factors: activity Relieving factors: heat pad and medication  PRECAUTIONS: None  RED FLAGS: None   WEIGHT BEARING RESTRICTIONS: No  FALLS:  Has patient fallen in last 6 months? No  OCCUPATION: retired  PLOF: Independent  PATIENT GOALS: To get more steady on my feet  NEXT MD VISIT: 4 weeks  OBJECTIVE:  Note: Objective measures  were completed at Evaluation unless otherwise noted.  DIAGNOSTIC FINDINGS:  IMPRESSION: 1. No acute findings. 2. Moderate spondylosis of the lumbar spine with moderate disc disease at the L3-4 level. 3. Posterior fusion hardware from L4-S1 with interbody fusion at the L4-5 and L5-S1 levels.     Electronically Signed   By: Elberta Fortis M.D.   On: 04/16/2023 16:45  PATIENT SURVEYS:  FOTO 32(40 predicted)  MUSCLE LENGTH: Hamstrings: Right 70 deg; Left 70 deg Thomas test: not tested due to pain  POSTURE: rounded shoulders and decreased lumbar  lordosis  PALPATION: Increased tension throughout lumbar and thoracic paraspinals  LUMBAR ROM: Not tested  AROM eval  Flexion   Extension   Right lateral flexion   Left lateral flexion   Right rotation   Left rotation    (Blank rows = not tested)  LOWER EXTREMITY ROM:   Not tested  Active  Right eval Left eval  Hip flexion    Hip extension    Hip abduction    Hip adduction    Hip internal rotation    Hip external rotation    Knee flexion    Knee extension    Ankle dorsiflexion    Ankle plantarflexion    Ankle inversion    Ankle eversion     (Blank rows = not tested)  LOWER EXTREMITY MMT:  see 30s chair stand test  MMT Right eval Left eval  Hip flexion    Hip extension    Hip abduction    Hip adduction    Hip internal rotation    Hip external rotation    Knee flexion    Knee extension    Ankle dorsiflexion    Ankle plantarflexion    Ankle inversion    Ankle eversion     (Blank rows = not tested)  LUMBAR SPECIAL TESTS:  Straight leg raise test: Negative and Slump test: Negative SLR performed in sitting  FUNCTIONAL TESTS:  30 seconds chair stand test 6 reps with UE assist  GAIT: Distance walked: 45ft x2 Assistive device utilized: None Level of assistance: Complete Independence Comments: unremarkable  TODAY'S TREATMENT:            [facility] Adult PT Treatment:                                                DATE: 06/30/23 Therapeutic Exercise: SKTC 30s x2 B LTR 30s x2 B Supine march 10/10 Supine hip fallouts YTB 10x2 B, 10/10 unilaterally Supine hip flexor stretch 30s x2 B Seated latissimus press 3s x10 Lat press with alternating FAQs 10x 2 MWT 115ft (declined second lap despite only 60s duration)                                                                                                                      DATE: 06/15/23 Eval  PATIENT EDUCATION:  Education details: Discussed eval findings, rehab rationale and POC and patient is in  agreement  Person educated: Patient Education method: Explanation Education comprehension: verbalized understanding and needs further education  HOME EXERCISE PROGRAM: Access Code: [email or web address] URL: [email or web address] Date: 06/15/2023 Prepared by: Gustavus Bryant  Exercises - Supine Lower Trunk Rotation  - 2 x daily - 5 x weekly - 1 sets - 2 reps - 30s hold - Supine March  - 2 x daily - 5 x weekly - 1 sets - 10 reps - Heel Toe Raises with Counter Support  - 2 x daily - 5 x weekly - 1 sets - 10 reps  ASSESSMENT:  CLINICAL IMPRESSION: First f/u session, focus was on stretching and flexibility to tolerance.  Cued for pacing and breathing.    Patient is a 77 y.o. female who was seen today for physical therapy evaluation and treatment for low back pain. Symptoms onset 9 months ago insidiously.  Patient denies radicular symptoms but does endorse B hip pain due to OA.  SLR and slump test negative.  Trunk mobility limited due to pain and spasm.  Denied balance issues or recent falls.  Prior hx of L4-S1 fusion.  Rehab potential is fair based on pain levels and kinesiophobia  OBJECTIVE IMPAIRMENTS: decreased activity tolerance, decreased knowledge of condition, decreased mobility, decreased ROM, increased muscle spasms, improper body mechanics, postural dysfunction, and pain.   ACTIVITY LIMITATIONS: carrying, lifting, bending, sitting, standing, stairs, and bed mobility  PERSONAL FACTORS: Age, Fitness, Past/current experiences, and Time since onset of injury/illness/exacerbation are also affecting patient's functional outcome.   REHAB POTENTIAL: fair  CLINICAL DECISION MAKING: Stable/uncomplicated  EVALUATION COMPLEXITY: Low   GOALS: Goals reviewed with patient? No  SHORT TERM GOALS: Target date: 07/06/2023    Patient to demonstrate independence in HEP  Baseline: [id number] Goal status: INITIAL  2.  Assess 2 MWT for baseline and set appropriate goal Baseline: TBD; 06/30/23  141ft Goal status: INITIAL    LONG TERM GOALS: Target date: 07/27/2023    Patient will acknowledge 6/10 pain at least once during episode of care   Baseline: 10/10 Goal status: INITIAL  2.  Patient will score at least 40% on FOTO to signify clinically meaningful improvement in functional abilities.   Baseline: 32 Goal status: INITIAL  3.  Patient will increase 30s chair stand reps from 6 to 8 with/without arms to demonstrate and improved functional ability with less pain/difficulty as well as reduce fall risk.  Baseline: 6 Goal status: INITIAL  4.  Assess progress towards 2 MWT goal; 06/30/23 Goal is 346ft Baseline: TBD; 06/30/23 129ft Goal status: INITIAL    PLAN:  PT FREQUENCY: 1-2x/week  PT DURATION: 6 weeks  PLANNED INTERVENTIONS: 97164- PT Re-evaluation, 97110-Therapeutic exercises, 97530- Therapeutic activity, 97112- Neuromuscular re-education, 97535- Self Care, 24401- Manual therapy, 97116- Gait training, Dry Needling, Joint mobilization, Spinal mobilization, Cryotherapy, and Moist heat.  PLAN FOR NEXT SESSION: HEP review and update, manual techniques as appropriate, aerobic tasks, ROM and flexibility activities, strengthening and PREs, TPDN, gait and balance training as needed     Hildred Laser, PT 06/30/2023, 2:45 PM

## 2023-06-30 ENCOUNTER — Ambulatory Visit: Payer: 59 | Attending: Physical Medicine and Rehabilitation

## 2023-06-30 DIAGNOSIS — M6281 Muscle weakness (generalized): Secondary | ICD-10-CM | POA: Diagnosis present

## 2023-06-30 DIAGNOSIS — R2689 Other abnormalities of gait and mobility: Secondary | ICD-10-CM | POA: Diagnosis present

## 2023-06-30 DIAGNOSIS — M5459 Other low back pain: Secondary | ICD-10-CM | POA: Insufficient documentation

## 2023-07-01 NOTE — Therapy (Unsigned)
OUTPATIENT PHYSICAL THERAPY THORACOLUMBAR EVALUATION   Patient Name: [name] MRN: [id number] DOB:1945-08-16, 77 y.o., female Today's Date: 07/02/2023  END OF SESSION:  PT End of Session - 07/02/23 1403     Visit Number 3    Number of Visits 12    Date for PT Re-Evaluation 08/15/23    Authorization Type MCR/MCD    PT Start Time 1400    PT Stop Time 1440    PT Time Calculation (min) 40 min    Activity Tolerance Patient limited by pain;Patient tolerated treatment well    Behavior During Therapy [facility] for tasks assessed/performed               Past Medical History:  Diagnosis Date   Abdominal pain, left lower quadrant 08/14/2009   Qualifier: Diagnosis of  By: Dorian Pod, Pam     Abnormal CT scan, stomach    Abnormal LFTs 06/19/2017   AICD (automatic cardioverter/defibrillator) present    Dr.Allred follows   Aortic arch pseudoaneurysm (HCC)    a. followed by Dr. Donata Clay.   Arthritis    Asthma    Benign neoplasm of colon    CAD (coronary artery disease) 02/2009   a. anterior STEMI rx with BMS to prox LAD in 02/2009. b. ISR s/p PTCA 11-Jul-2009. c. ISR s/p thrombectomy & PTCA 03/2010 due to late stent thrombosis. // Myoview 04/2019: EF 28, ant, ant-sept, inf-sept scar, no ischemia, high risk (stable>>cont med Rx)    Cardiomyopathy, ischemic 06/12/2011   Chronic renal insufficiency    stage 3   Chronic systolic CHF (congestive heart failure) (HCC)    a. s/p ST. Jude ICD 07/11/2012.   Chronic systolic heart failure (HCC) 08/05/2011   Colon polyp    Complication of anesthesia    COPD (chronic obstructive pulmonary disease) (HCC)    CORONARY ARTERY DISEASE 04/24/2009   Qualifier: Diagnosis of  By: Myrtie Hawk, Amy S    DDD (degenerative disc disease), lumbar 05/16/2011   Depression    "since my son died in 2022-11-10" 2018/07/11)   Diverticulosis    DIVERTICULOSIS-COLON 08/14/2009   Qualifier: Diagnosis of  By: Monica Becton PA-c, Amy S    DYSPHAGIA UNSPECIFIED 04/24/2009    Qualifier: Diagnosis of  By: Dorian Pod, Pam     Dyspnea    "when I lay down at night"    Essential hypertension 09/05/2009   Qualifier: Diagnosis of  By: Yancey Flemings CMA, Jennifer     Gastric polyp    GERD 10/19/2007   Qualifier: Diagnosis of  By: Luberta Robertson RN, Eli Phillips: Diagnosis of  By: Dorian Pod, Pam     GERD (gastroesophageal reflux disease)    GI bleed    a. h/o GIB on DAPT, now on ASA only.   Headache 01/03/2013   Hearing loss    left ear   History of pulmonary embolus (PE) 01/02/2011   Hyperlipemia    Hyperlipidemia, unspecified 04/24/2009   Qualifier: Diagnosis of  By: Monica Becton PA-c, Amy S    Hypertension    ICD-St.Jude 08/06/2011   S/p St. Jude ICD placement 08/05/11    Insomnia    Irritable bowel syndrome    Ischemic cardiomyopathy    EF 10-15%   Memory loss    Migraine headache without aura    Myocardial infarct (HCC) 2011 x 2   Dr. Cooper,cardiology    PERSONAL HX COLONIC POLYPS 04/24/2009   Qualifier: Diagnosis of  By: Peterson Ao November, 2011 colonoscopy demonstrated  a sessile cecal polyp and sigmoid polyp    Pneumonia    PONV (postoperative nausea and vomiting)    Pre-diabetes    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   Pulmonary embolism (HCC) 2011   Stroke [facility])    'When I was young." no residual   Thoracic aortic aneurysm (HCC) 03/31/2018   TOBACCO ABUSE 04/04/2009   Qualifier: Diagnosis of  By: Jolene Provost  Qualifier: Diagnosis of  By: Riley Kill, MD, Johny Sax    Transfusion history    ?'12 or '13   UNSPECIFIED ANEMIA 07/03/2010   Qualifier: Diagnosis of  By: Omer Jack     Unspecified mastoiditis    Past Surgical History:  Procedure Laterality Date   ANGIOPLASTY  07/02/09, 04/01/10   BILATERAL SALPINGOOPHORECTOMY     CAD( bare metal stent)  02/2009   x 1   CAROTID-SUBCLAVIAN BYPASS GRAFT Left 03/31/2018   Procedure: LEFT SUBCLAVIAN ARTERY BYPASS GRAFT;  Surgeon: Nada Libman, MD;  Location: [facility] OR;   Service: Vascular;  Laterality: Left;   CERVICAL SPINE SURGERY  08/08   COLONOSCOPY WITH PROPOFOL N/A 04/29/2016   Procedure: COLONOSCOPY WITH PROPOFOL;  Surgeon: Ruffin Frederick, MD;  Location: [facility] ENDOSCOPY;  Service: Gastroenterology;  Laterality: N/A;   ESOPHAGOGASTRODUODENOSCOPY N/A 12/09/2016   Procedure: ESOPHAGOGASTRODUODENOSCOPY (EGD);  Surgeon: Ruffin Frederick, MD;  Location: [facility];  Service: Gastroenterology;  Laterality: N/A;   ESOPHAGOGASTRODUODENOSCOPY (EGD) WITH PROPOFOL N/A 04/29/2016   Procedure: ESOPHAGOGASTRODUODENOSCOPY (EGD) WITH PROPOFOL;  Surgeon: Ruffin Frederick, MD;  Location: [facility] ENDOSCOPY;  Service: Gastroenterology;  Laterality: N/A;   EUS N/A 05/15/2016   Procedure: UPPER ENDOSCOPIC ULTRASOUND (EUS) RADIAL;  Surgeon: Rachael Fee, MD;  Location: [facility] ENDOSCOPY;  Service: Endoscopy;  Laterality: N/A;   ICD GENERATOR CHANGEOUT N/A 11/04/2022   Procedure: ICD GENERATOR CHANGEOUT;  Surgeon: Nelly Laurence, Roberts Gaudy, MD;  Location: [facility] INVASIVE CV LAB;  Service: Cardiovascular;  Laterality: N/A;   IMPLANTABLE CARDIOVERTER DEFIBRILLATOR IMPLANT N/A 08/05/2011   Primary prevention SJM ICD implanted,  Analyze ST study patient   INNER EAR SURGERY     left x 17   LUMBAR DISC SURGERY  02/2008   fusion   NASAL SEPTUM SURGERY     RIGHT HEART CATH N/A 12/11/2021   Procedure: RIGHT HEART CATH;  Surgeon: Tonny Bollman, MD;  Location: [facility] INVASIVE CV LAB;  Service: Cardiovascular;  Laterality: N/A;   THORACIC AORTIC ENDOVASCULAR STENT GRAFT N/A 03/31/2018   Procedure: THORACIC AORTIC ENDOVASCULAR STENT GRAFT;  Surgeon: Nada Libman, MD;  Location: [facility] OR;  Service: Vascular;  Laterality: N/A;   TOTAL ABDOMINAL HYSTERECTOMY     complete   Patient Active Problem List   Diagnosis Date Noted   Chronic pain syndrome 05/20/2023   Encounter for long-term opiate analgesic use 05/20/2023   Constipation 05/12/2023   Family history of colon cancer in mother 05/12/2023    Low back pain without sciatica 04/16/2023   Right hip pain 04/16/2023   Stage 4 chronic kidney disease (HCC) 11/25/2022   Pelvic pain 11/24/2022   UTI due to Klebsiella species 05/08/2022   Chronic bilateral low back pain without sciatica 05/08/2022   Spinal stenosis of lumbar region without neurogenic claudication 05/08/2022   HFrEF (heart failure with reduced ejection fraction) (HCC) 12/02/2021   Stage 3b chronic kidney disease (HCC) 08/08/2021   Chronic insomnia 11/14/2019   Colon polyp 06/16/2018   Thoracic aortic aneurysm without rupture (HCC) 03/31/2018   Chronic renal insufficiency  AICD (automatic cardioverter/defibrillator) present    Ischemic cardiomyopathy    COPD (chronic obstructive pulmonary disease) (HCC)    Dyslipidemia    Insomnia    Pre-diabetes 06/19/2017   Gastric polyp    Osteoporosis 05/11/2013   Hearing loss 04/29/2013   Mild cognitive impairment 04/29/2013   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   Implantable cardioverter-defibrillator (ICD) in situ 08/06/2011   DDD (degenerative disc disease), lumbar 05/16/2011   History of pulmonary embolus (PE) 01/02/2011   UNSPECIFIED ANEMIA 07/03/2010   Essential hypertension 09/05/2009   DIVERTICULOSIS-COLON 08/14/2009   History of colonic polyps 04/24/2009   TOBACCO ABUSE 04/04/2009   Coronary artery disease involving native coronary artery of native heart with angina pectoris (HCC) 02/25/2009   GERD 10/19/2007   IBS 10/19/2007   COLONIC POLYPS, ADENOMATOUS 05/12/2007    PCP: Georgina Quint, MD   REFERRING PROVIDER: Angelina Sheriff, DO  REFERRING DIAG: [phone] (ICD-10-CM) - Chronic bilateral low back pain without sciatica M25.551 (ICD-10-CM) - Right hip pain  Rationale for Evaluation and Treatment: Rehabilitation  THERAPY DIAG:  Other abnormalities of gait and mobility  Muscle weakness (generalized)  Other low back pain  ONSET DATE: cronic  SUBJECTIVE:                                                                                                                                                                                            SUBJECTIVE STATEMENT: Reports little to no hip pain last night, pleasantly surprised.  PERTINENT HISTORY:  Chronic bilateral low back pain without sciatica   I am sending you to physical therapy to work on your gait mechanics, stretching, strengthening, and range of motion to improve the pain control and functional goals that you have.  We discussed aqua therapy, however we are opting for land-based therapies because of your fear of water.   Continue using a walker for stability and to help you get around into the community   TPI next visit if no improvement   Right hip pain I am also sending you to our interventionalists, Dr. Wynn Banker, to look into nonsteroid injection options for your right hip given severe reaction to steroids in the past.  PAIN:  Are you having pain? Yes: NPRS scale: 10/10 Pain location: low back Pain description: ache Aggravating factors: activity Relieving factors: heat pad and medication  PRECAUTIONS: None  RED FLAGS: None   WEIGHT BEARING RESTRICTIONS: No  FALLS:  Has patient fallen in last 6 months? No  OCCUPATION: retired  PLOF: Independent  PATIENT GOALS: To get more steady on my feet  NEXT MD VISIT: 4 weeks  OBJECTIVE:  Note: Objective measures were completed at Evaluation unless otherwise noted.  DIAGNOSTIC FINDINGS:  IMPRESSION: 1. No acute findings. 2. Moderate spondylosis of the lumbar spine with moderate disc disease at the L3-4 level. 3. Posterior fusion hardware from L4-S1 with interbody fusion at the L4-5 and L5-S1 levels.     Electronically Signed   By: Elberta Fortis M.D.   On: 04/16/2023 16:45  PATIENT SURVEYS:  FOTO 32(40 predicted)  MUSCLE LENGTH: Hamstrings: Right 70 deg; Left 70 deg Thomas test: not tested due to pain  POSTURE: rounded shoulders  and decreased lumbar lordosis  PALPATION: Increased tension throughout lumbar and thoracic paraspinals  LUMBAR ROM: Not tested  AROM eval  Flexion   Extension   Right lateral flexion   Left lateral flexion   Right rotation   Left rotation    (Blank rows = not tested)  LOWER EXTREMITY ROM:   Not tested  Active  Right eval Left eval  Hip flexion    Hip extension    Hip abduction    Hip adduction    Hip internal rotation    Hip external rotation    Knee flexion    Knee extension    Ankle dorsiflexion    Ankle plantarflexion    Ankle inversion    Ankle eversion     (Blank rows = not tested)  LOWER EXTREMITY MMT:  see 30s chair stand test  MMT Right eval Left eval  Hip flexion    Hip extension    Hip abduction    Hip adduction    Hip internal rotation    Hip external rotation    Knee flexion    Knee extension    Ankle dorsiflexion    Ankle plantarflexion    Ankle inversion    Ankle eversion     (Blank rows = not tested)  LUMBAR SPECIAL TESTS:  Straight leg raise test: Negative and Slump test: Negative SLR performed in sitting  FUNCTIONAL TESTS:  30 seconds chair stand test 6 reps with UE assist  GAIT: Distance walked: 66ft x2 Assistive device utilized: None Level of assistance: Complete Independence Comments: unremarkable  TODAY'S TREATMENT:          [facility] Adult PT Treatment:                                                DATE: 07/02/23 Therapeutic Exercise: Nustep L2 8 min SKTC 30s x2 B QL 30s x2 B Supine march 15/15 2# 2s hold Supine hip fallouts RTB 15x B, 15/15 unilaterally Supine hip flexor stretch 30s x2 B 5x STS  [facility] Adult PT Treatment:                                                DATE: 06/30/23 Therapeutic Exercise: SKTC 30s x2 B LTR 30s x2 B Supine march 10/10 Supine hip fallouts YTB 10x2 B, 10/10 unilaterally Supine hip flexor stretch 30s x2 B Seated latissimus press 3s x10 Lat press with alternating FAQs 10x 2 MWT 12ft  (declined second lap despite only 60s duration)  DATE: 06/15/23 Eval    PATIENT EDUCATION:  Education details: Discussed eval findings, rehab rationale and POC and patient is in agreement  Person educated: Patient Education method: Explanation Education comprehension: verbalized understanding and needs further education  HOME EXERCISE PROGRAM: Access Code: [email or web address] URL: [email or web address] Date: 06/15/2023 Prepared by: Gustavus Bryant  Exercises - Supine Lower Trunk Rotation  - 2 x daily - 5 x weekly - 1 sets - 2 reps - 30s hold - Supine March  - 2 x daily - 5 x weekly - 1 sets - 10 reps - Heel Toe Raises with Counter Support  - 2 x daily - 5 x weekly - 1 sets - 10 reps  ASSESSMENT:  CLINICAL IMPRESSION: Added aerobic work and continued stretch based program.  Modified stretch to account for increased mobility and challenge.  Added weight and reps as noted, focus on ROM, pacing and form. Increased mobility/flexibility observed.  Patient is a 77 y.o. female who was seen today for physical therapy evaluation and treatment for low back pain. Symptoms onset 9 months ago insidiously.  Patient denies radicular symptoms but does endorse B hip pain due to OA.  SLR and slump test negative.  Trunk mobility limited due to pain and spasm.  Denied balance issues or recent falls.  Prior hx of L4-S1 fusion.  Rehab potential is fair based on pain levels and kinesiophobia  OBJECTIVE IMPAIRMENTS: decreased activity tolerance, decreased knowledge of condition, decreased mobility, decreased ROM, increased muscle spasms, improper body mechanics, postural dysfunction, and pain.   ACTIVITY LIMITATIONS: carrying, lifting, bending, sitting, standing, stairs, and bed mobility  PERSONAL FACTORS: Age, Fitness, Past/current experiences, and Time since onset of  injury/illness/exacerbation are also affecting patient's functional outcome.   REHAB POTENTIAL: fair  CLINICAL DECISION MAKING: Stable/uncomplicated  EVALUATION COMPLEXITY: Low   GOALS: Goals reviewed with patient? No  SHORT TERM GOALS: Target date: 07/06/2023    Patient to demonstrate independence in HEP  Baseline: [id number] Goal status: INITIAL  2.  Assess 2 MWT for baseline and set appropriate goal Baseline: TBD; 06/30/23 177ft Goal status: Met    LONG TERM GOALS: Target date: 07/27/2023    Patient will acknowledge 6/10 pain at least once during episode of care   Baseline: 10/10 Goal status: INITIAL  2.  Patient will score at least 40% on FOTO to signify clinically meaningful improvement in functional abilities.   Baseline: 32 Goal status: INITIAL  3.  Patient will increase 30s chair stand reps from 6 to 8 with/without arms to demonstrate and improved functional ability with less pain/difficulty as well as reduce fall risk.  Baseline: 6 Goal status: INITIAL  4.  Assess progress towards 2 MWT goal; 06/30/23 Goal is 373ft Baseline: TBD; 06/30/23 128ft Goal status: INITIAL    PLAN:  PT FREQUENCY: 1-2x/week  PT DURATION: 6 weeks  PLANNED INTERVENTIONS: [id number]- PT Re-evaluation, 97110-Therapeutic exercises, 97530- Therapeutic activity, 97112- Neuromuscular re-education, 97535- Self Care, 95621- Manual therapy, 97116- Gait training, Dry Needling, Joint mobilization, Spinal mobilization, Cryotherapy, and Moist heat.  PLAN FOR NEXT SESSION: HEP review and update, manual techniques as appropriate, aerobic tasks, ROM and flexibility activities, strengthening and PREs, TPDN, gait and balance training as needed     Hildred Laser, PT 07/02/2023, 2:47 PM

## 2023-07-02 ENCOUNTER — Ambulatory Visit: Payer: 59

## 2023-07-02 DIAGNOSIS — M6281 Muscle weakness (generalized): Secondary | ICD-10-CM

## 2023-07-02 DIAGNOSIS — R2689 Other abnormalities of gait and mobility: Secondary | ICD-10-CM | POA: Diagnosis not present

## 2023-07-02 DIAGNOSIS — M5459 Other low back pain: Secondary | ICD-10-CM

## 2023-07-07 ENCOUNTER — Other Ambulatory Visit: Payer: Self-pay | Admitting: Physician Assistant

## 2023-07-07 ENCOUNTER — Ambulatory Visit: Payer: 59

## 2023-07-07 ENCOUNTER — Other Ambulatory Visit: Payer: Self-pay | Admitting: Cardiovascular Disease

## 2023-07-07 DIAGNOSIS — K219 Gastro-esophageal reflux disease without esophagitis: Secondary | ICD-10-CM

## 2023-07-07 DIAGNOSIS — R2689 Other abnormalities of gait and mobility: Secondary | ICD-10-CM

## 2023-07-07 DIAGNOSIS — M5459 Other low back pain: Secondary | ICD-10-CM

## 2023-07-07 DIAGNOSIS — M6281 Muscle weakness (generalized): Secondary | ICD-10-CM

## 2023-07-07 NOTE — Therapy (Signed)
OUTPATIENT PHYSICAL THERAPY TREATMENT NOTE   Patient Name: [name] MRN: [id number] DOB:01/20/1946, 77 y.o., female Today's Date: 07/07/2023  END OF SESSION:  PT End of Session - 07/07/23 1349     Visit Number 4    Number of Visits 12    Date for PT Re-Evaluation 08/15/23    Authorization Type MCR/MCD    PT Start Time 1400    PT Stop Time 1438    PT Time Calculation (min) 38 min    Activity Tolerance Patient limited by pain;Patient tolerated treatment well    Behavior During Therapy [facility] for tasks assessed/performed                Past Medical History:  Diagnosis Date   Abdominal pain, left lower quadrant 08/14/2009   Qualifier: Diagnosis of  By: Dorian Pod, Pam     Abnormal CT scan, stomach    Abnormal LFTs 06/19/2017   AICD (automatic cardioverter/defibrillator) present    Dr.Allred follows   Aortic arch pseudoaneurysm (HCC)    a. followed by Dr. Donata Clay.   Arthritis    Asthma    Benign neoplasm of colon    CAD (coronary artery disease) 02/2009   a. anterior STEMI rx with BMS to prox LAD in 02/2009. b. ISR s/p PTCA 2009-08-09. c. ISR s/p thrombectomy & PTCA 03/2010 due to late stent thrombosis. // Myoview 04/2019: EF 28, ant, ant-sept, inf-sept scar, no ischemia, high risk (stable>>cont med Rx)    Cardiomyopathy, ischemic 06/12/2011   Chronic renal insufficiency    stage 3   Chronic systolic CHF (congestive heart failure) (HCC)    a. s/p ST. Jude ICD 08/09/2012.   Chronic systolic heart failure (HCC) 08/05/2011   Colon polyp    Complication of anesthesia    COPD (chronic obstructive pulmonary disease) (HCC)    CORONARY ARTERY DISEASE 04/24/2009   Qualifier: Diagnosis of  By: Myrtie Hawk, Amy S    DDD (degenerative disc disease), lumbar 05/16/2011   Depression    "since my son died in Dec 09, 2022" 2018/08/09)   Diverticulosis    DIVERTICULOSIS-COLON 08/14/2009   Qualifier: Diagnosis of  By: Monica Becton PA-c, Amy S    DYSPHAGIA UNSPECIFIED 04/24/2009   Qualifier:  Diagnosis of  By: Dorian Pod, Pam     Dyspnea    "when I lay down at night"    Essential hypertension 09/05/2009   Qualifier: Diagnosis of  By: Yancey Flemings CMA, Jennifer     Gastric polyp    GERD 10/19/2007   Qualifier: Diagnosis of  By: Luberta Robertson RN, Eli Phillips: Diagnosis of  By: Dorian Pod, Pam     GERD (gastroesophageal reflux disease)    GI bleed    a. h/o GIB on DAPT, now on ASA only.   Headache 01/03/2013   Hearing loss    left ear   History of pulmonary embolus (PE) 01/02/2011   Hyperlipemia    Hyperlipidemia, unspecified 04/24/2009   Qualifier: Diagnosis of  By: Monica Becton PA-c, Amy S    Hypertension    ICD-St.Jude 08/06/2011   S/p St. Jude ICD placement 08/05/11    Insomnia    Irritable bowel syndrome    Ischemic cardiomyopathy    EF 10-15%   Memory loss    Migraine headache without aura    Myocardial infarct [facility]) 2011 x 2   Dr. Cooper,cardiology    PERSONAL HX COLONIC POLYPS 04/24/2009   Qualifier: Diagnosis of  By: Peterson Ao November, 2011 colonoscopy  demonstrated a sessile cecal polyp and sigmoid polyp    Pneumonia    PONV (postoperative nausea and vomiting)    Pre-diabetes    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   Pulmonary embolism (HCC) 2011   Stroke [facility])    'When I was young." no residual   Thoracic aortic aneurysm (HCC) 03/31/2018   TOBACCO ABUSE 04/04/2009   Qualifier: Diagnosis of  By: Jolene Provost  Qualifier: Diagnosis of  By: Riley Kill, MD, Johny Sax    Transfusion history    ?'12 or '13   UNSPECIFIED ANEMIA 07/03/2010   Qualifier: Diagnosis of  By: Omer Jack     Unspecified mastoiditis    Past Surgical History:  Procedure Laterality Date   ANGIOPLASTY  07/02/09, 04/01/10   BILATERAL SALPINGOOPHORECTOMY     CAD( bare metal stent)  02/2009   x 1   CAROTID-SUBCLAVIAN BYPASS GRAFT Left 03/31/2018   Procedure: LEFT SUBCLAVIAN ARTERY BYPASS GRAFT;  Surgeon: Nada Libman, MD;  Location: [facility] OR;  Service:  Vascular;  Laterality: Left;   CERVICAL SPINE SURGERY  08/08   COLONOSCOPY WITH PROPOFOL N/A 04/29/2016   Procedure: COLONOSCOPY WITH PROPOFOL;  Surgeon: Ruffin Frederick, MD;  Location: [facility] ENDOSCOPY;  Service: Gastroenterology;  Laterality: N/A;   ESOPHAGOGASTRODUODENOSCOPY N/A 12/09/2016   Procedure: ESOPHAGOGASTRODUODENOSCOPY (EGD);  Surgeon: Ruffin Frederick, MD;  Location: [facility];  Service: Gastroenterology;  Laterality: N/A;   ESOPHAGOGASTRODUODENOSCOPY (EGD) WITH PROPOFOL N/A 04/29/2016   Procedure: ESOPHAGOGASTRODUODENOSCOPY (EGD) WITH PROPOFOL;  Surgeon: Ruffin Frederick, MD;  Location: [facility] ENDOSCOPY;  Service: Gastroenterology;  Laterality: N/A;   EUS N/A 05/15/2016   Procedure: UPPER ENDOSCOPIC ULTRASOUND (EUS) RADIAL;  Surgeon: Rachael Fee, MD;  Location: [facility] ENDOSCOPY;  Service: Endoscopy;  Laterality: N/A;   ICD GENERATOR CHANGEOUT N/A 11/04/2022   Procedure: ICD GENERATOR CHANGEOUT;  Surgeon: Nelly Laurence, Roberts Gaudy, MD;  Location: [facility] INVASIVE CV LAB;  Service: Cardiovascular;  Laterality: N/A;   IMPLANTABLE CARDIOVERTER DEFIBRILLATOR IMPLANT N/A 08/05/2011   Primary prevention SJM ICD implanted,  Analyze ST study patient   INNER EAR SURGERY     left x 17   LUMBAR DISC SURGERY  02/2008   fusion   NASAL SEPTUM SURGERY     RIGHT HEART CATH N/A 12/11/2021   Procedure: RIGHT HEART CATH;  Surgeon: Tonny Bollman, MD;  Location: [facility] INVASIVE CV LAB;  Service: Cardiovascular;  Laterality: N/A;   THORACIC AORTIC ENDOVASCULAR STENT GRAFT N/A 03/31/2018   Procedure: THORACIC AORTIC ENDOVASCULAR STENT GRAFT;  Surgeon: Nada Libman, MD;  Location: [facility] OR;  Service: Vascular;  Laterality: N/A;   TOTAL ABDOMINAL HYSTERECTOMY     complete   Patient Active Problem List   Diagnosis Date Noted   Chronic pain syndrome 05/20/2023   Encounter for long-term opiate analgesic use 05/20/2023   Constipation 05/12/2023   Family history of colon cancer in mother 05/12/2023   Low  back pain without sciatica 04/16/2023   Right hip pain 04/16/2023   Stage 4 chronic kidney disease (HCC) 11/25/2022   Pelvic pain 11/24/2022   UTI due to Klebsiella species 05/08/2022   Chronic bilateral low back pain without sciatica 05/08/2022   Spinal stenosis of lumbar region without neurogenic claudication 05/08/2022   HFrEF (heart failure with reduced ejection fraction) (HCC) 12/02/2021   Stage 3b chronic kidney disease (HCC) 08/08/2021   Chronic insomnia 11/14/2019   Colon polyp 06/16/2018   Thoracic aortic aneurysm without rupture (HCC) 03/31/2018   Chronic renal insufficiency  AICD (automatic cardioverter/defibrillator) present    Ischemic cardiomyopathy    COPD (chronic obstructive pulmonary disease) (HCC)    Dyslipidemia    Insomnia    Pre-diabetes 06/19/2017   Gastric polyp    Osteoporosis 05/11/2013   Hearing loss 04/29/2013   Mild cognitive impairment 04/29/2013   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   Implantable cardioverter-defibrillator (ICD) in situ 08/06/2011   DDD (degenerative disc disease), lumbar 05/16/2011   History of pulmonary embolus (PE) 01/02/2011   UNSPECIFIED ANEMIA 07/03/2010   Essential hypertension 09/05/2009   DIVERTICULOSIS-COLON 08/14/2009   History of colonic polyps 04/24/2009   TOBACCO ABUSE 04/04/2009   Coronary artery disease involving native coronary artery of native heart with angina pectoris (HCC) 02/25/2009   GERD 10/19/2007   IBS 10/19/2007   COLONIC POLYPS, ADENOMATOUS 05/12/2007    PCP: Georgina Quint, MD   REFERRING PROVIDER: Angelina Sheriff, DO  REFERRING DIAG: [phone] (ICD-10-CM) - Chronic bilateral low back pain without sciatica M25.551 (ICD-10-CM) - Right hip pain  Rationale for Evaluation and Treatment: Rehabilitation  THERAPY DIAG:  Other abnormalities of gait and mobility  Muscle weakness (generalized)  Other low back pain  ONSET DATE: cronic  SUBJECTIVE:                                                                                                                                                                                            SUBJECTIVE STATEMENT:  Patient reports continued Lt hip pain.  PERTINENT HISTORY:  Chronic bilateral low back pain without sciatica   I am sending you to physical therapy to work on your gait mechanics, stretching, strengthening, and range of motion to improve the pain control and functional goals that you have.  We discussed aqua therapy, however we are opting for land-based therapies because of your fear of water.   Continue using a walker for stability and to help you get around into the community   TPI next visit if no improvement   Right hip pain I am also sending you to our interventionalists, Dr. Wynn Banker, to look into nonsteroid injection options for your right hip given severe reaction to steroids in the past.  PAIN:  Are you having pain? Yes: NPRS scale: 10/10 Pain location: low back Pain description: ache Aggravating factors: activity Relieving factors: heat pad and medication  PRECAUTIONS: None  RED FLAGS: None   WEIGHT BEARING RESTRICTIONS: No  FALLS:  Has patient fallen in last 6 months? No  OCCUPATION: retired  PLOF: Independent  PATIENT GOALS: To get more steady on my feet  NEXT MD VISIT: 4 weeks  OBJECTIVE:  Note: Objective  measures were completed at Evaluation unless otherwise noted.  DIAGNOSTIC FINDINGS:  IMPRESSION: 1. No acute findings. 2. Moderate spondylosis of the lumbar spine with moderate disc disease at the L3-4 level. 3. Posterior fusion hardware from L4-S1 with interbody fusion at the L4-5 and L5-S1 levels.     Electronically Signed   By: Elberta Fortis M.D.   On: 04/16/2023 16:45  PATIENT SURVEYS:  FOTO 32(40 predicted)  MUSCLE LENGTH: Hamstrings: Right 70 deg; Left 70 deg Thomas test: not tested due to pain  POSTURE: rounded shoulders and decreased lumbar  lordosis  PALPATION: Increased tension throughout lumbar and thoracic paraspinals  LUMBAR ROM: Not tested  AROM eval  Flexion   Extension   Right lateral flexion   Left lateral flexion   Right rotation   Left rotation    (Blank rows = not tested)  LOWER EXTREMITY ROM:   Not tested  Active  Right eval Left eval  Hip flexion    Hip extension    Hip abduction    Hip adduction    Hip internal rotation    Hip external rotation    Knee flexion    Knee extension    Ankle dorsiflexion    Ankle plantarflexion    Ankle inversion    Ankle eversion     (Blank rows = not tested)  LOWER EXTREMITY MMT:  see 30s chair stand test  MMT Right eval Left eval  Hip flexion    Hip extension    Hip abduction    Hip adduction    Hip internal rotation    Hip external rotation    Knee flexion    Knee extension    Ankle dorsiflexion    Ankle plantarflexion    Ankle inversion    Ankle eversion     (Blank rows = not tested)  LUMBAR SPECIAL TESTS:  Straight leg raise test: Negative and Slump test: Negative SLR performed in sitting  FUNCTIONAL TESTS:  30 seconds chair stand test 6 reps with UE assist  GAIT: Distance walked: 54ft x2 Assistive device utilized: None Level of assistance: Complete Independence Comments: unremarkable  TODAY'S TREATMENT:          [facility] Adult PT Treatment:                                                DATE: 07/07/23 Therapeutic Exercise: Nustep L2 5 min Heel raises 2x10 Seated marching x30"  Seated LAQ 2x10 BIL Supine hip adduction ball squeeze 3" hold x10 SKTC 30s B Supine march 2# 2x30" Supine hip fallouts RTB 15x B, 15/15 unilaterally SLR x10 BIL STS 2x10   [facility] Adult PT Treatment:                                                DATE: 07/02/23 Therapeutic Exercise: Nustep L2 8 min SKTC 30s x2 B QL 30s x2 B Supine march 15/15 2# 2s hold Supine hip fallouts RTB 15x B, 15/15 unilaterally Supine hip flexor stretch 30s x2 B 5x  STS  [facility] Adult PT Treatment:  DATE: 06/30/23 Therapeutic Exercise: SKTC 30s x2 B LTR 30s x2 B Supine march 10/10 Supine hip fallouts YTB 10x2 B, 10/10 unilaterally Supine hip flexor stretch 30s x2 B Seated latissimus press 3s x10 Lat press with alternating FAQs 10x 2 MWT 160ft (declined second lap despite only 60s duration)     PATIENT EDUCATION:  Education details: Discussed eval findings, rehab rationale and POC and patient is in agreement  Person educated: Patient Education method: Explanation Education comprehension: verbalized understanding and needs further education  HOME EXERCISE PROGRAM: Access Code: [email or web address] URL: [email or web address] Date: 06/15/2023 Prepared by: Gustavus Bryant  Exercises - Supine Lower Trunk Rotation  - 2 x daily - 5 x weekly - 1 sets - 2 reps - 30s hold - Supine March  - 2 x daily - 5 x weekly - 1 sets - 10 reps - Heel Toe Raises with Counter Support  - 2 x daily - 5 x weekly - 1 sets - 10 reps  ASSESSMENT:  CLINICAL IMPRESSION:  Patient presents to PT reporting continued Lt hip pain and that it hurts when she sleeps on that side. Session today continued to focus on proximal hip and LE strengthening within in her pain tolerance. Patient continues to benefit from skilled PT services and should be progressed as able to improve functional independence.    EVAL: Patient is a 77 y.o. female who was seen today for physical therapy evaluation and treatment for low back pain. Symptoms onset 9 months ago insidiously.  Patient denies radicular symptoms but does endorse B hip pain due to OA.  SLR and slump test negative.  Trunk mobility limited due to pain and spasm.  Denied balance issues or recent falls.  Prior hx of L4-S1 fusion.  Rehab potential is fair based on pain levels and kinesiophobia  OBJECTIVE IMPAIRMENTS: decreased activity tolerance, decreased knowledge of condition, decreased  mobility, decreased ROM, increased muscle spasms, improper body mechanics, postural dysfunction, and pain.   ACTIVITY LIMITATIONS: carrying, lifting, bending, sitting, standing, stairs, and bed mobility  PERSONAL FACTORS: Age, Fitness, Past/current experiences, and Time since onset of injury/illness/exacerbation are also affecting patient's functional outcome.   REHAB POTENTIAL: fair  CLINICAL DECISION MAKING: Stable/uncomplicated  EVALUATION COMPLEXITY: Low   GOALS: Goals reviewed with patient? No  SHORT TERM GOALS: Target date: 07/06/2023    Patient to demonstrate independence in HEP  Baseline: [id number] Goal status: Met  2.  Assess 2 MWT for baseline and set appropriate goal Baseline: TBD; 06/30/23 146ft Goal status: Met    LONG TERM GOALS: Target date: 07/27/2023    Patient will acknowledge 6/10 pain at least once during episode of care   Baseline: 10/10 Goal status: INITIAL  2.  Patient will score at least 40% on FOTO to signify clinically meaningful improvement in functional abilities.   Baseline: 32 Goal status: INITIAL  3.  Patient will increase 30s chair stand reps from 6 to 8 with/without arms to demonstrate and improved functional ability with less pain/difficulty as well as reduce fall risk.  Baseline: 6 Goal status: INITIAL  4.  Assess progress towards 2 MWT goal; 06/30/23 Goal is 360ft Baseline: TBD; 06/30/23 167ft Goal status: INITIAL    PLAN:  PT FREQUENCY: 1-2x/week  PT DURATION: 6 weeks  PLANNED INTERVENTIONS: [id number]- PT Re-evaluation, 97110-Therapeutic exercises, 97530- Therapeutic activity, 97112- Neuromuscular re-education, 97535- Self Care, [id number]- Manual therapy, 97116- Gait training, Dry Needling, Joint mobilization, Spinal mobilization, Cryotherapy, and Moist heat.  PLAN FOR NEXT SESSION: HEP review and update, manual techniques  as appropriate, aerobic tasks, ROM and flexibility activities, strengthening and PREs, TPDN, gait and balance  training as needed     Berta Minor, PTA 07/07/2023, 2:38 PM

## 2023-07-08 ENCOUNTER — Ambulatory Visit: Payer: 59 | Admitting: Cardiovascular Disease

## 2023-07-09 ENCOUNTER — Ambulatory Visit: Payer: 59

## 2023-07-09 DIAGNOSIS — M6281 Muscle weakness (generalized): Secondary | ICD-10-CM

## 2023-07-09 DIAGNOSIS — M5459 Other low back pain: Secondary | ICD-10-CM

## 2023-07-09 DIAGNOSIS — R2689 Other abnormalities of gait and mobility: Secondary | ICD-10-CM

## 2023-07-09 NOTE — Therapy (Addendum)
OUTPATIENT PHYSICAL THERAPY TREATMENT NOTE/DISCHARGE   Patient Name: [name] MRN: [id number] DOB:1946/01/11, 77 y.o., female Today's Date: 07/09/2023  END OF SESSION:  PT End of Session - 07/09/23 1346     Visit Number 5    Number of Visits 12    Date for PT Re-Evaluation 08/15/23    Authorization Type MCR/MCD    PT Start Time 1400    PT Stop Time 1438    PT Time Calculation (min) 38 min    Activity Tolerance Patient limited by pain;Patient tolerated treatment well    Behavior During Therapy [facility] for tasks assessed/performed             Past Medical History:  Diagnosis Date   Abdominal pain, left lower quadrant 08/14/2009   Qualifier: Diagnosis of  By: Dorian Pod, Pam     Abnormal CT scan, stomach    Abnormal LFTs 06/19/2017   AICD (automatic cardioverter/defibrillator) present    Dr.Allred follows   Aortic arch pseudoaneurysm (HCC)    a. followed by Dr. Donata Clay.   Arthritis    Asthma    Benign neoplasm of colon    CAD (coronary artery disease) 02/2009   a. anterior STEMI rx with BMS to prox LAD in 02/2009. b. ISR s/p PTCA 06/2009. c. ISR s/p thrombectomy & PTCA 03/2010 due to late stent thrombosis. // Myoview 04/2019: EF 28, ant, ant-sept, inf-sept scar, no ischemia, high risk (stable>>cont med Rx)    Cardiomyopathy, ischemic 06/12/2011   Chronic renal insufficiency    stage 3   Chronic systolic CHF (congestive heart failure) (HCC)    a. s/p ST. Jude ICD 23-Aug-2011.   Chronic systolic heart failure (HCC) 08/05/2011   Colon polyp    Complication of anesthesia    COPD (chronic obstructive pulmonary disease) (HCC)    CORONARY ARTERY DISEASE 04/24/2009   Qualifier: Diagnosis of  By: Myrtie Hawk, Amy S    DDD (degenerative disc disease), lumbar 05/16/2011   Depression    "since my son died in November 21, 2023" August 22, 2017)   Diverticulosis    DIVERTICULOSIS-COLON 08/14/2009   Qualifier: Diagnosis of  By: Monica Becton PA-c, Amy S    DYSPHAGIA UNSPECIFIED 04/24/2009   Qualifier:  Diagnosis of  By: Dorian Pod, Pam     Dyspnea    "when I lay down at night"    Essential hypertension 09/05/2009   Qualifier: Diagnosis of  By: Yancey Flemings CMA, Jennifer     Gastric polyp    GERD 10/19/2007   Qualifier: Diagnosis of  By: Luberta Robertson RN, Eli Phillips: Diagnosis of  By: Dorian Pod, Pam     GERD (gastroesophageal reflux disease)    GI bleed    a. h/o GIB on DAPT, now on ASA only.   Headache 01/03/2013   Hearing loss    left ear   History of pulmonary embolus (PE) 01/02/2011   Hyperlipemia    Hyperlipidemia, unspecified 04/24/2009   Qualifier: Diagnosis of  By: Monica Becton PA-c, Amy S    Hypertension    ICD-St.Jude 08/06/2011   S/p St. Jude ICD placement 08/05/11    Insomnia    Irritable bowel syndrome    Ischemic cardiomyopathy    EF 10-15%   Memory loss    Migraine headache without aura    Myocardial infarct (HCC) 2011 x 2   Dr. Cooper,cardiology    PERSONAL HX COLONIC POLYPS 04/24/2009   Qualifier: Diagnosis of  By: Peterson Ao November, 2011 colonoscopy demonstrated a sessile  cecal polyp and sigmoid polyp    Pneumonia    PONV (postoperative nausea and vomiting)    Pre-diabetes    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   Pulmonary embolism (HCC) 2011   Stroke [facility])    'When I was young." no residual   Thoracic aortic aneurysm (HCC) 03/31/2018   TOBACCO ABUSE 04/04/2009   Qualifier: Diagnosis of  By: Jolene Provost  Qualifier: Diagnosis of  By: Riley Kill, MD, Johny Sax    Transfusion history    ?'12 or '13   UNSPECIFIED ANEMIA 07/03/2010   Qualifier: Diagnosis of  By: Omer Jack     Unspecified mastoiditis    Past Surgical History:  Procedure Laterality Date   ANGIOPLASTY  07/02/09, 04/01/10   BILATERAL SALPINGOOPHORECTOMY     CAD( bare metal stent)  02/2009   x 1   CAROTID-SUBCLAVIAN BYPASS GRAFT Left 03/31/2018   Procedure: LEFT SUBCLAVIAN ARTERY BYPASS GRAFT;  Surgeon: Nada Libman, MD;  Location: [facility] OR;  Service:  Vascular;  Laterality: Left;   CERVICAL SPINE SURGERY  08/08   COLONOSCOPY WITH PROPOFOL N/A 04/29/2016   Procedure: COLONOSCOPY WITH PROPOFOL;  Surgeon: Ruffin Frederick, MD;  Location: [facility] ENDOSCOPY;  Service: Gastroenterology;  Laterality: N/A;   ESOPHAGOGASTRODUODENOSCOPY N/A 12/09/2016   Procedure: ESOPHAGOGASTRODUODENOSCOPY (EGD);  Surgeon: Ruffin Frederick, MD;  Location: [facility];  Service: Gastroenterology;  Laterality: N/A;   ESOPHAGOGASTRODUODENOSCOPY (EGD) WITH PROPOFOL N/A 04/29/2016   Procedure: ESOPHAGOGASTRODUODENOSCOPY (EGD) WITH PROPOFOL;  Surgeon: Ruffin Frederick, MD;  Location: [facility] ENDOSCOPY;  Service: Gastroenterology;  Laterality: N/A;   EUS N/A 05/15/2016   Procedure: UPPER ENDOSCOPIC ULTRASOUND (EUS) RADIAL;  Surgeon: Rachael Fee, MD;  Location: [facility] ENDOSCOPY;  Service: Endoscopy;  Laterality: N/A;   ICD GENERATOR CHANGEOUT N/A 11/04/2022   Procedure: ICD GENERATOR CHANGEOUT;  Surgeon: Nelly Laurence, Roberts Gaudy, MD;  Location: [facility] INVASIVE CV LAB;  Service: Cardiovascular;  Laterality: N/A;   IMPLANTABLE CARDIOVERTER DEFIBRILLATOR IMPLANT N/A 08/05/2011   Primary prevention SJM ICD implanted,  Analyze ST study patient   INNER EAR SURGERY     left x 17   LUMBAR DISC SURGERY  02/2008   fusion   NASAL SEPTUM SURGERY     RIGHT HEART CATH N/A 12/11/2021   Procedure: RIGHT HEART CATH;  Surgeon: Tonny Bollman, MD;  Location: [facility] INVASIVE CV LAB;  Service: Cardiovascular;  Laterality: N/A;   THORACIC AORTIC ENDOVASCULAR STENT GRAFT N/A 03/31/2018   Procedure: THORACIC AORTIC ENDOVASCULAR STENT GRAFT;  Surgeon: Nada Libman, MD;  Location: [facility] OR;  Service: Vascular;  Laterality: N/A;   TOTAL ABDOMINAL HYSTERECTOMY     complete   Patient Active Problem List   Diagnosis Date Noted   Chronic pain syndrome 05/20/2023   Encounter for long-term opiate analgesic use 05/20/2023   Constipation 05/12/2023   Family history of colon cancer in mother 05/12/2023   Low  back pain without sciatica 04/16/2023   Right hip pain 04/16/2023   Stage 4 chronic kidney disease (HCC) 11/25/2022   Pelvic pain 11/24/2022   UTI due to Klebsiella species 05/08/2022   Chronic bilateral low back pain without sciatica 05/08/2022   Spinal stenosis of lumbar region without neurogenic claudication 05/08/2022   HFrEF (heart failure with reduced ejection fraction) (HCC) 12/02/2021   Stage 3b chronic kidney disease (HCC) 08/08/2021   Chronic insomnia 11/14/2019   Colon polyp 06/16/2018   Thoracic aortic aneurysm without rupture (HCC) 03/31/2018   Chronic renal insufficiency  AICD (automatic cardioverter/defibrillator) present    Ischemic cardiomyopathy    COPD (chronic obstructive pulmonary disease) (HCC)    Dyslipidemia    Insomnia    Pre-diabetes 06/19/2017   Gastric polyp    Osteoporosis 05/11/2013   Hearing loss 04/29/2013   Mild cognitive impairment 04/29/2013   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   Implantable cardioverter-defibrillator (ICD) in situ 08/06/2011   DDD (degenerative disc disease), lumbar 05/16/2011   History of pulmonary embolus (PE) 01/02/2011   UNSPECIFIED ANEMIA 07/03/2010   Essential hypertension 09/05/2009   DIVERTICULOSIS-COLON 08/14/2009   History of colonic polyps 04/24/2009   TOBACCO ABUSE 04/04/2009   Coronary artery disease involving native coronary artery of native heart with angina pectoris (HCC) 02/25/2009   GERD 10/19/2007   IBS 10/19/2007   COLONIC POLYPS, ADENOMATOUS 05/12/2007    PCP: Georgina Quint, MD   REFERRING PROVIDER: Angelina Sheriff, DO  REFERRING DIAG: [phone] (ICD-10-CM) - Chronic bilateral low back pain without sciatica M25.551 (ICD-10-CM) - Right hip pain  Rationale for Evaluation and Treatment: Rehabilitation  THERAPY DIAG:  Other abnormalities of gait and mobility  Muscle weakness (generalized)  Other low back pain  ONSET DATE: cronic  SUBJECTIVE:                                                                                                                                                                                            SUBJECTIVE STATEMENT:  Patient presents to PT reporting that she had some chest pain, tightness, and SOB. She states that this is new and not worsening since earlier today. She states that she is up to date on her BP medication, but does not monitor her BP at home.   PERTINENT HISTORY:  Chronic bilateral low back pain without sciatica   I am sending you to physical therapy to work on your gait mechanics, stretching, strengthening, and range of motion to improve the pain control and functional goals that you have.  We discussed aqua therapy, however we are opting for land-based therapies because of your fear of water.   Continue using a walker for stability and to help you get around into the community   TPI next visit if no improvement   Right hip pain I am also sending you to our interventionalists, Dr. Wynn Banker, to look into nonsteroid injection options for your right hip given severe reaction to steroids in the past.  PAIN:  Are you having pain? Yes: NPRS scale: 10/10 Pain location: low back Pain description: ache Aggravating factors: activity Relieving factors: heat pad and medication  PRECAUTIONS: None  RED FLAGS: None   WEIGHT  BEARING RESTRICTIONS: No  FALLS:  Has patient fallen in last 6 months? No  OCCUPATION: retired  PLOF: Independent  PATIENT GOALS: To get more steady on my feet  NEXT MD VISIT: 4 weeks  OBJECTIVE:  Note: Objective measures were completed at Evaluation unless otherwise noted.  DIAGNOSTIC FINDINGS:  IMPRESSION: 1. No acute findings. 2. Moderate spondylosis of the lumbar spine with moderate disc disease at the L3-4 level. 3. Posterior fusion hardware from L4-S1 with interbody fusion at the L4-5 and L5-S1 levels.     Electronically Signed   By: Elberta Fortis M.D.   On:  04/16/2023 16:45  PATIENT SURVEYS:  FOTO 32(40 predicted)  MUSCLE LENGTH: Hamstrings: Right 70 deg; Left 70 deg Thomas test: not tested due to pain  POSTURE: rounded shoulders and decreased lumbar lordosis  PALPATION: Increased tension throughout lumbar and thoracic paraspinals  LUMBAR ROM: Not tested  AROM eval  Flexion   Extension   Right lateral flexion   Left lateral flexion   Right rotation   Left rotation    (Blank rows = not tested)  LOWER EXTREMITY ROM:   Not tested  Active  Right eval Left eval  Hip flexion    Hip extension    Hip abduction    Hip adduction    Hip internal rotation    Hip external rotation    Knee flexion    Knee extension    Ankle dorsiflexion    Ankle plantarflexion    Ankle inversion    Ankle eversion     (Blank rows = not tested)  LOWER EXTREMITY MMT:  see 30s chair stand test  MMT Right eval Left eval  Hip flexion    Hip extension    Hip abduction    Hip adduction    Hip internal rotation    Hip external rotation    Knee flexion    Knee extension    Ankle dorsiflexion    Ankle plantarflexion    Ankle inversion    Ankle eversion     (Blank rows = not tested)  LUMBAR SPECIAL TESTS:  Straight leg raise test: Negative and Slump test: Negative SLR performed in sitting  FUNCTIONAL TESTS:  30 seconds chair stand test 6 reps with UE assist  GAIT: Distance walked: 55ft x2 Assistive device utilized: None Level of assistance: Complete Independence Comments: unremarkable  TODAY'S TREATMENT:          [facility] Adult PT Treatment:                                                DATE: 07/09/23 Vital at beginning of session: 162/91; O2 sats 97% HR 84 BPM Therapeutic Exercise: Nustep L2 5 min HR: 89 O2 97% Seated marching 2x30"  Seated LAQ 2x10 BIL Supine hip adduction ball squeeze 3" hold 2x10 SKTC 30s B Supine march 2x30" Supine hip fallouts RTB 15x B, 15/15 unilaterally SLR x10 BIL   [facility] Adult PT Treatment:                                                 DATE: 07/07/23 Therapeutic Exercise: Nustep L2 5 min Heel raises 2x10 Seated marching x30"  Seated LAQ 2x10 BIL Supine hip  adduction ball squeeze 3" hold x10 SKTC 30s B Supine march 2# 2x30" Supine hip fallouts RTB 15x B, 15/15 unilaterally SLR x10 BIL STS 2x10   [facility] Adult PT Treatment:                                                DATE: 07/02/23 Therapeutic Exercise: Nustep L2 8 min SKTC 30s x2 B QL 30s x2 B Supine march 15/15 2# 2s hold Supine hip fallouts RTB 15x B, 15/15 unilaterally Supine hip flexor stretch 30s x2 B 5x STS    PATIENT EDUCATION:  Education details: Discussed eval findings, rehab rationale and POC and patient is in agreement  Person educated: Patient Education method: Explanation Education comprehension: verbalized understanding and needs further education  HOME EXERCISE PROGRAM: Access Code: [email or web address] URL: [email or web address] Date: 06/15/2023 Prepared by: Gustavus Bryant  Exercises - Supine Lower Trunk Rotation  - 2 x daily - 5 x weekly - 1 sets - 2 reps - 30s hold - Supine March  - 2 x daily - 5 x weekly - 1 sets - 10 reps - Heel Toe Raises with Counter Support  - 2 x daily - 5 x weekly - 1 sets - 10 reps  ASSESSMENT:  CLINICAL IMPRESSION:  Patient presents to PT reporting earlier today at the grocery store she noticed some chest pain, tightness, and shortness of breath. Vital taken prior to beginning session, see above objective info. She reports improvement since earlier today and that she is up to date on her BP medication but does not monitor her BP at home. Monitored throughout session and regressed session today to accommodate. She does not endorse any worsening of symptoms with gentle exercise. Advised patient if tightness, pain, and SOB worsen, to present to the ER. Patient continues to benefit from skilled PT services and should be progressed as able to improve functional independence.     EVAL: Patient is a 77 y.o. female who was seen today for physical therapy evaluation and treatment for low back pain. Symptoms onset 9 months ago insidiously.  Patient denies radicular symptoms but does endorse B hip pain due to OA.  SLR and slump test negative.  Trunk mobility limited due to pain and spasm.  Denied balance issues or recent falls.  Prior hx of L4-S1 fusion.  Rehab potential is fair based on pain levels and kinesiophobia  OBJECTIVE IMPAIRMENTS: decreased activity tolerance, decreased knowledge of condition, decreased mobility, decreased ROM, increased muscle spasms, improper body mechanics, postural dysfunction, and pain.   ACTIVITY LIMITATIONS: carrying, lifting, bending, sitting, standing, stairs, and bed mobility  PERSONAL FACTORS: Age, Fitness, Past/current experiences, and Time since onset of injury/illness/exacerbation are also affecting patient's functional outcome.   REHAB POTENTIAL: fair  CLINICAL DECISION MAKING: Stable/uncomplicated  EVALUATION COMPLEXITY: Low   GOALS: Goals reviewed with patient? No  SHORT TERM GOALS: Target date: 07/06/2023    Patient to demonstrate independence in HEP  Baseline: [id number] Goal status: Met  2.  Assess 2 MWT for baseline and set appropriate goal Baseline: TBD; 06/30/23 122ft Goal status: Met    LONG TERM GOALS: Target date: 07/27/2023    Patient will acknowledge 6/10 pain at least once during episode of care   Baseline: 10/10 Goal status: INITIAL  2.  Patient will score at least 40% on FOTO to signify clinically meaningful improvement in functional abilities.  Baseline: 32 Goal status: INITIAL  3.  Patient will increase 30s chair stand reps from 6 to 8 with/without arms to demonstrate and improved functional ability with less pain/difficulty as well as reduce fall risk.  Baseline: 6 Goal status: INITIAL  4.  Assess progress towards 2 MWT goal; 06/30/23 Goal is 315ft Baseline: TBD; 06/30/23 131ft Goal  status: INITIAL    PLAN:  PT FREQUENCY: 1-2x/week  PT DURATION: 6 weeks  PLANNED INTERVENTIONS: 97164- PT Re-evaluation, 97110-Therapeutic exercises, 97530- Therapeutic activity, 97112- Neuromuscular re-education, 97535- Self Care, [id number]- Manual therapy, 97116- Gait training, Dry Needling, Joint mobilization, Spinal mobilization, Cryotherapy, and Moist heat.  PLAN FOR NEXT SESSION: HEP review and update, manual techniques as appropriate, aerobic tasks, ROM and flexibility activities, strengthening and PREs, TPDN, gait and balance training as needed     Berta Minor, PTA 07/09/2023, 2:40 PM

## 2023-07-13 NOTE — Progress Notes (Signed)
Subjective:    Patient ID: [name], female    DOB: [birth year], 77 y.o.   MRN: [id number]  HPI   [name] is a 77 y.o. year old female  who  has a past medical history of Abdominal pain, left lower quadrant (08/14/2009), Abnormal CT scan, stomach, Abnormal LFTs (06/19/2017), AICD (automatic cardioverter/defibrillator) present, Aortic arch pseudoaneurysm (HCC), Arthritis, Asthma, Benign neoplasm of colon, CAD (coronary artery disease) (02/2009), Cardiomyopathy, ischemic (06/12/2011), Chronic renal insufficiency, Chronic systolic CHF (congestive heart failure) (HCC), Chronic systolic heart failure (HCC) (4/0/9811), Colon polyp, Complication of anesthesia, COPD (chronic obstructive pulmonary disease) (HCC), CORONARY ARTERY DISEASE (04/24/2009), DDD (degenerative disc disease), lumbar (05/16/2011), Depression, Diverticulosis, DIVERTICULOSIS-COLON (08/14/2009), DYSPHAGIA UNSPECIFIED (04/24/2009), Dyspnea, Essential hypertension (09/05/2009), Gastric polyp, GERD (10/19/2007), GERD (gastroesophageal reflux disease), GI bleed, Headache (01/03/2013), Hearing loss, History of pulmonary embolus (PE) (01/02/2011), Hyperlipemia, Hyperlipidemia, unspecified (04/24/2009), Hypertension, ICD-St.Jude (08/06/2011), Insomnia, Irritable bowel syndrome, Ischemic cardiomyopathy, Memory loss, Migraine headache without aura, Myocardial infarct (HCC) (2011 x 2), PERSONAL HX COLONIC POLYPS (04/24/2009), Pneumonia, PONV (postoperative nausea and vomiting), Pre-diabetes, 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   They are presenting to PM&R clinic for follow up related to  bilateral low back and right hip pain.  .  Plan from last visit:  Chronic pain syndrome Encounter for therapeutic drug monitoring Encounter for long-term  opiate analgesic use   Today, you filled out a pain contract and had a urine drug screen.     I have refilled your tramadol 50 mg daily for 2 months.  If urine drug screen results are as expected, we will continue prescribing medication with ultimate goal to wean off of tramadol.  Alternatives would be to transition to a Butrans patch, given your poor kidney function.   Follow-up with me in 2 months.     Chronic bilateral low back pain without sciatica   I am sending you to physical therapy to work on your gait mechanics, stretching, strengthening, and range of motion to improve the pain control and functional goals that you have.  We discussed aqua therapy, however we are opting for land-based therapies because of your fear of water.   Continue using a walker for stability and to help you get around into the community   TPI next visit if no improvement   Right hip pain I am also sending you to our interventionalists, Dr. Wynn Banker, to look into nonsteroid injection options for your right hip given severe reaction to steroids in the past.   Interval Hx:  - Therapies: Finished 4 weeks of PT recently; unsure if it is being renewed. She states it helped initially, "but then the pain just came back again" in her right groin. She states her pain occurs during the day but not as often as it does at nighttime. Usually during exertional activity, will stop as soon as the activity is finished.    - Follow ups: Saw Dr. Wynn Banker 11/05; At this point I will hold off on recommending a hip intra-articular injection based on the fact that her pain only occurs at night and it does not keep her awake. Offered PRP but unable to afford at $500 OOP.   Chronic low back pain her pain is mainly around the lumbar fusion site. Medial branch blocks are not indicated in this area. She is at risk for sacroiliac pain. She could  probably tolerate a sacroiliac injection using a small steroid dose. Unfortunately  sacroiliac radiofrequency neurotomies are no longer covered by insurance.     - Falls: None; "I was so glad for that because I've had a lot of falls".    - DME: uses heating pad on her hip intermittently, with relief.   - Medications: TRAMADOL 50 mg once daily - she has only been taking it once a week or so. No side effects. Intermittently uses tylenol, 3 tabs once daily.   Takes topomax at nighttime and elavil for headaches; only gets them in the summer and aren't as bad as they used to be.    - Other concerns: Her BP got "very high" last week, 165 SBP; now normal. She says that day "I was hurting real bad and I was sore on my chest".    Pain Inventory Average Pain 9 Pain Right Now 6 My pain is intermittent and sharp  In the last 24 hours, has pain interfered with the following? General activity 9 Relation with others 0 Enjoyment of life 0 What TIME of day is your pain at its worst? daytime and night Sleep (in general) Poor  Pain is worse with: walking, bending, inactivity, standing, and some activites Pain improves with: heat/ice and medication Relief from Meds: 9  Family History  Problem Relation Age of Onset   Colon cancer Mother    Other Brother        tumor in his brain   Bladder Cancer Brother    Throat cancer Brother    Cancer Son        kidney, spread to his liver   Breast cancer Cousin    Social History   Socioeconomic History   Marital status: Divorced    Spouse name: Not on file   Number of children: 2   Years of education: 11   Highest education level: 12th grade  Occupational History   Occupation: Designer, industrial/product: UNEMPLOYED    Employer: DISABLED  Tobacco Use   Smoking status: Former    Current packs/day: 0.13    Average packs/day: 0.1 packs/day for 30.0 years (3.9 ttl pk-yrs)    Types: Cigarettes   Smokeless tobacco: Never  Vaping Use   Vaping status: Never Used  Substance and Sexual Activity   Alcohol use: No    Alcohol/week: 0.0  standard drinks of alcohol   Drug use: No   Sexual activity: Not Currently  Other Topics Concern   Not on file  Social History Narrative   Pt lives in [place] alone.  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   Patient has 11 th grade education.Right handed.   Caffeine- one cup daily         Social Drivers of Health   Financial Resource Strain: Low Risk  (02/17/2023)   Overall Financial Resource Strain (CARDIA)    Difficulty of Paying Living Expenses: Not very hard  Recent Concern: Financial Resource Strain - Medium Risk (11/20/2022)   Overall Financial Resource Strain (CARDIA)    Difficulty of Paying Living Expenses: Somewhat hard  Food Insecurity: No Food Insecurity (02/17/2023)   Hunger Vital Sign    Worried About Running Out of Food in the Last Year: Never true    Ran Out of Food in the Last Year: Never true  Transportation Needs: No Transportation Needs (02/17/2023)   PRAPARE - Administrator, Civil Service (Medical): No    Lack of Transportation (Non-Medical): No  Physical Activity: Inactive (02/17/2023)   Exercise Vital Sign    Days of Exercise per Week: 0 days    Minutes of Exercise per Session: 0 min  Stress: No Stress Concern Present (02/17/2023)   [employer] of Occupational Health - Occupational Stress Questionnaire    Feeling of Stress : Not at all  Recent Concern: Stress - Stress Concern Present (11/20/2022)   [employer] of Occupational Health - Occupational Stress Questionnaire    Feeling of Stress : To some extent  Social Connections: Socially Isolated (02/17/2023)   Social Connection and Isolation Panel [NHANES]    Frequency of Communication with Friends and Family: Never    Frequency of Social Gatherings with Friends and Family: Never    Attends Religious Services: 1 to 4 times per year    Active Member of [employer] or Organizations: No    Attends Banker Meetings: Never    Marital Status: Divorced   Past Surgical  History:  Procedure Laterality Date   ANGIOPLASTY  07/02/09, 04/01/10   BILATERAL SALPINGOOPHORECTOMY     CAD( bare metal stent)  02/2009   x 1   CAROTID-SUBCLAVIAN BYPASS GRAFT Left 03/31/2018   Procedure: LEFT SUBCLAVIAN ARTERY BYPASS GRAFT;  Surgeon: Nada Libman, MD;  Location: [facility] OR;  Service: Vascular;  Laterality: Left;   CERVICAL SPINE SURGERY  08/08   COLONOSCOPY WITH PROPOFOL N/A 04/29/2016   Procedure: COLONOSCOPY WITH PROPOFOL;  Surgeon: Ruffin Frederick, MD;  Location: [facility];  Service: Gastroenterology;  Laterality: N/A;   ESOPHAGOGASTRODUODENOSCOPY N/A 12/09/2016   Procedure: ESOPHAGOGASTRODUODENOSCOPY (EGD);  Surgeon: Ruffin Frederick, MD;  Location: [facility];  Service: Gastroenterology;  Laterality: N/A;   ESOPHAGOGASTRODUODENOSCOPY (EGD) WITH PROPOFOL N/A 04/29/2016   Procedure: ESOPHAGOGASTRODUODENOSCOPY (EGD) WITH PROPOFOL;  Surgeon: Ruffin Frederick, MD;  Location: [facility];  Service: Gastroenterology;  Laterality: N/A;   EUS N/A 05/15/2016   Procedure: UPPER ENDOSCOPIC ULTRASOUND (EUS) RADIAL;  Surgeon: Rachael Fee, MD;  Location: [facility];  Service: Endoscopy;  Laterality: N/A;   ICD GENERATOR CHANGEOUT N/A 11/04/2022   Procedure: ICD GENERATOR CHANGEOUT;  Surgeon: Nelly Laurence, Roberts Gaudy, MD;  Location: [facility] INVASIVE CV LAB;  Service: Cardiovascular;  Laterality: N/A;   IMPLANTABLE CARDIOVERTER DEFIBRILLATOR IMPLANT N/A 08/05/2011   Primary prevention SJM ICD implanted,  Analyze ST study patient   INNER EAR SURGERY     left x 17   LUMBAR DISC SURGERY  02/2008   fusion   NASAL SEPTUM SURGERY     RIGHT HEART CATH N/A 12/11/2021   Procedure: RIGHT HEART CATH;  Surgeon: Tonny Bollman, MD;  Location: [facility] INVASIVE CV LAB;  Service: Cardiovascular;  Laterality: N/A;   THORACIC AORTIC ENDOVASCULAR STENT GRAFT N/A 03/31/2018   Procedure: THORACIC AORTIC ENDOVASCULAR STENT GRAFT;  Surgeon: Nada Libman, MD;  Location: [facility] OR;  Service: Vascular;   Laterality: N/A;   TOTAL ABDOMINAL HYSTERECTOMY     complete   Past Surgical History:  Procedure Laterality Date   ANGIOPLASTY  07/02/09, 04/01/10   BILATERAL SALPINGOOPHORECTOMY     CAD( bare metal stent)  02/2009   x 1   CAROTID-SUBCLAVIAN BYPASS GRAFT Left 03/31/2018   Procedure: LEFT SUBCLAVIAN ARTERY BYPASS GRAFT;  Surgeon: Nada Libman, MD;  Location: [facility] OR;  Service: Vascular;  Laterality: Left;   CERVICAL SPINE SURGERY  08/08   COLONOSCOPY WITH PROPOFOL N/A 04/29/2016   Procedure: COLONOSCOPY WITH PROPOFOL;  Surgeon: Ruffin Frederick, MD;  Location: [facility];  Service: Gastroenterology;  Laterality: N/A;   ESOPHAGOGASTRODUODENOSCOPY N/A 12/09/2016   Procedure: ESOPHAGOGASTRODUODENOSCOPY (EGD);  Surgeon: Ruffin Frederick, MD;  Location: [facility];  Service: Gastroenterology;  Laterality: N/A;   ESOPHAGOGASTRODUODENOSCOPY (EGD) WITH PROPOFOL N/A 04/29/2016   Procedure: ESOPHAGOGASTRODUODENOSCOPY (EGD) WITH PROPOFOL;  Surgeon: Ruffin Frederick, MD;  Location: [facility];  Service: Gastroenterology;  Laterality: N/A;   EUS N/A 05/15/2016   Procedure: UPPER ENDOSCOPIC ULTRASOUND (EUS) RADIAL;  Surgeon: Rachael Fee, MD;  Location: [facility];  Service: Endoscopy;  Laterality: N/A;   ICD GENERATOR CHANGEOUT N/A 11/04/2022   Procedure: ICD GENERATOR CHANGEOUT;  Surgeon: Nelly Laurence, Roberts Gaudy, MD;  Location: [facility] INVASIVE CV LAB;  Service: Cardiovascular;  Laterality: N/A;   IMPLANTABLE CARDIOVERTER DEFIBRILLATOR IMPLANT N/A 08/05/2011   Primary prevention SJM ICD implanted,  Analyze ST study patient   INNER EAR SURGERY     left x 17   LUMBAR DISC SURGERY  02/2008   fusion   NASAL SEPTUM SURGERY     RIGHT HEART CATH N/A 12/11/2021   Procedure: RIGHT HEART CATH;  Surgeon: Tonny Bollman, MD;  Location: [facility] INVASIVE CV LAB;  Service: Cardiovascular;  Laterality: N/A;   THORACIC AORTIC ENDOVASCULAR STENT GRAFT N/A 03/31/2018   Procedure: THORACIC AORTIC ENDOVASCULAR  STENT GRAFT;  Surgeon: Nada Libman, MD;  Location: [facility] OR;  Service: Vascular;  Laterality: N/A;   TOTAL ABDOMINAL HYSTERECTOMY     complete   Past Medical History:  Diagnosis Date   Abdominal pain, left lower quadrant 08/14/2009   Qualifier: Diagnosis of  By: Dorian Pod, Pam     Abnormal CT scan, stomach    Abnormal LFTs 06/19/2017   AICD (automatic cardioverter/defibrillator) present    Dr.Allred follows   Aortic arch pseudoaneurysm (HCC)    a. followed by Dr. Donata Clay.   Arthritis    Asthma    Benign neoplasm of colon    CAD (coronary artery disease) 02/2009   a. anterior STEMI rx with BMS to prox LAD in 02/2009. b. ISR s/p PTCA 07-29-09. c. ISR s/p thrombectomy & PTCA 03/2010 due to late stent thrombosis. // Myoview 04/2019: EF 28, ant, ant-sept, inf-sept scar, no ischemia, high risk (stable>>cont med Rx)    Cardiomyopathy, ischemic 06/12/2011   Chronic renal insufficiency    stage 3   Chronic systolic CHF (congestive heart failure) (HCC)    a. s/p ST. Jude ICD July 29, 2012.   Chronic systolic heart failure (HCC) 08/05/2011   Colon polyp    Complication of anesthesia    COPD (chronic obstructive pulmonary disease) (HCC)    CORONARY ARTERY DISEASE 04/24/2009   Qualifier: Diagnosis of  By: Myrtie Hawk, Amy S    DDD (degenerative disc disease), lumbar 05/16/2011   Depression    "since my son died in 28-Nov-2022" 2018/07/29)   Diverticulosis    DIVERTICULOSIS-COLON 08/14/2009   Qualifier: Diagnosis of  By: Monica Becton PA-c, Amy S    DYSPHAGIA UNSPECIFIED 04/24/2009   Qualifier: Diagnosis of  By: Dorian Pod, Pam     Dyspnea    "when I lay down at night"    Essential hypertension 09/05/2009   Qualifier: Diagnosis of  By: Yancey Flemings CMA, Jennifer     Gastric polyp    GERD 10/19/2007   Qualifier: Diagnosis of  By: Luberta Robertson RN, Eli Phillips: Diagnosis of  By: Dorian Pod, Pam     GERD (gastroesophageal reflux disease)    GI bleed    a. h/o GIB on DAPT, now on ASA only.  Headache 01/03/2013    Hearing loss    left ear   History of pulmonary embolus (PE) 01/02/2011   Hyperlipemia    Hyperlipidemia, unspecified 04/24/2009   Qualifier: Diagnosis of  By: Monica Becton PA-c, Amy S    Hypertension    ICD-St.Jude 08/06/2011   S/p St. Jude ICD placement 08/05/11    Insomnia    Irritable bowel syndrome    Ischemic cardiomyopathy    EF 10-15%   Memory loss    Migraine headache without aura    Myocardial infarct (HCC) 2011 x 2   Dr. Cooper,cardiology    PERSONAL HX COLONIC POLYPS 04/24/2009   Qualifier: Diagnosis of  By: Myrtie Hawk, Amy S November, 2011 colonoscopy demonstrated a sessile cecal polyp and sigmoid polyp    Pneumonia    PONV (postoperative nausea and vomiting)    Pre-diabetes    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   Pulmonary embolism (HCC) 2011   Stroke [facility])    'When I was young." no residual   Thoracic aortic aneurysm (HCC) 03/31/2018   TOBACCO ABUSE 04/04/2009   Qualifier: Diagnosis of  By: Jolene Provost  Qualifier: Diagnosis of  By: Riley Kill, MD, Johny Sax    Transfusion history    ?'12 or '13   UNSPECIFIED ANEMIA 07/03/2010   Qualifier: Diagnosis of  By: Omer Jack     Unspecified mastoiditis    There were no vitals taken for this visit.  Opioid Risk Score:   Fall Risk Score:  `1  Depression screen PHQ 2/9     06/02/2023    2:28 PM 05/20/2023   11:26 AM 04/20/2023   10:07 AM 04/16/2023    2:45 PM 02/17/2023   11:37 AM 08/20/2022    2:16 PM 05/08/2022    1:45 PM  Depression screen PHQ 2/9  Decreased Interest 0 0 0 0 0 0 2  Down, Depressed, Hopeless 0 0 0 0 0 0 0  PHQ - 2 Score 0 0 0 0 0 0 2  Altered sleeping  3  1 3   0  Tired, decreased energy  0  0 0  2  Change in appetite  0  0 0  0  Feeling bad or failure about yourself   0  0 1  0  Trouble concentrating  3  0 1  2  Moving slowly or fidgety/restless  0  0 0  0  Suicidal thoughts  0  0 0  0  PHQ-9 Score  6  1 5  6   Difficult doing work/chores    Not difficult at  all Somewhat difficult  Very difficult    Review of Systems  Musculoskeletal:  Positive for back pain.      Objective:   Physical Exam  PE: Constitution: Appropriate appearance for age. No apparent distress  Resp: No respiratory distress. No accessory muscle usage. on RA and CTAB Cardio: Well perfused appearance. No peripheral edema. Abdomen: Nondistended. Nontender.   Psych: Appropriate mood and affect. Anxious, pressured speech.  Neuro: AAOx4. No apparent cognitive deficits    Neurologic Exam:   DTRs: Reflexes were 2+ in bilateral achilles, patella, biceps, BR and triceps. Babinsky: flexor responses b/l.   Hoffmans: negative b/l Sensory exam: revealed normal sensation in all dermatomal regions in bilateral lower extremities Motor exam: strength 4/5 throughout bilateral lower extremities Coordination: Fine motor coordination was normal.   Gait: +antalgic gait     Back Exam:   Inspection: Pelvis was  even.  Lumbar lordotic curvature was  reduced .   Palpation: Palpatory exam revealed ttp at the bilateral lumbar paraspinals, PSIS R>L, and psoas/quadratus . There was no evidence of spasm.  + tenderpoint, isolated tenderpoint above right acetabulum posteriolaterally   ROM revealed restricted ROM in low back, bilateral hip extension . Special/provocative testing:    SLR: Mild +, isolated tenderpoint above right acetabulum posteriolaterally   Slump test: -   Facet loading: +, isolated tenderpoint above right acetabulum posteriolaterally   TTP at paraspinals: +, isolated tenderpoint above right acetabulum posteriolaterally   Faber test: -   FAIR test: -      Assessment & Plan:   MARKAILA PECHA is a 77 y.o. year old female  who  has a past medical history of Abdominal pain, left lower quadrant (08/14/2009), Abnormal CT scan, stomach, Abnormal LFTs (06/19/2017), AICD (automatic cardioverter/defibrillator) present, Aortic arch pseudoaneurysm (HCC), Arthritis, Asthma, Benign  neoplasm of colon, CAD (coronary artery disease) (02/2009), Cardiomyopathy, ischemic (06/12/2011), Chronic renal insufficiency, Chronic systolic CHF (congestive heart failure) (HCC), Chronic systolic heart failure (HCC) (04/02/453), Colon polyp, Complication of anesthesia, COPD (chronic obstructive pulmonary disease) (HCC), CORONARY ARTERY DISEASE (04/24/2009), DDD (degenerative disc disease), lumbar (05/16/2011), Depression, Diverticulosis, DIVERTICULOSIS-COLON (08/14/2009), DYSPHAGIA UNSPECIFIED (04/24/2009), Dyspnea, Essential hypertension (09/05/2009), Gastric polyp, GERD (10/19/2007), GERD (gastroesophageal reflux disease), GI bleed, Headache (01/03/2013), Hearing loss, History of pulmonary embolus (PE) (01/02/2011), Hyperlipemia, Hyperlipidemia, unspecified (04/24/2009), Hypertension, ICD-St.Jude (08/06/2011), Insomnia, Irritable bowel syndrome, Ischemic cardiomyopathy, Memory loss, Migraine headache without aura, Myocardial infarct (HCC) (2011 x 2), PERSONAL HX COLONIC POLYPS (04/24/2009), Pneumonia, PONV (postoperative nausea and vomiting), Pre-diabetes, Pseudoaneurysm of aortic arch (HCC) (05/05/2012), Pulmonary embolism (HCC) (2011), Stroke [facility]), Thoracic aortic aneurysm (HCC) (03/31/2018), TOBACCO ABUSE (04/04/2009), Transfusion history, UNSPECIFIED ANEMIA (07/03/2010), and Unspecified mastoiditis.   They are presenting to PM&R clinic for follow up related to  bilateral low back and right hip pain.  .  Chronic pain syndrome Encounter for therapeutic drug monitoring Encounter for long-term opiate analgesic use Using tramadol 50 mg as needed with good result, intermittently with Tylenol.  Is afraid of high medication burden, does not wish to change medication at this time.  I will wait on tramadol refill until you are out given you are using it rarely; contact clinic when you need it!  Follow-up for trigger point injections as below. Chronic right-sided low back pain without sciatica Bilateral hip pain -      Ambulatory referral to Physical Therapy Continue heat as needed for your back pain  I am sending you to PT to trial a TENS unit for use on your low back; we will order one if this is helpful  Use bengay or muscle rub on your back as needed for muscle pain  follow up with me after the holidays for trigger point injections into the low back

## 2023-07-15 ENCOUNTER — Encounter: Payer: Self-pay | Admitting: Physical Medicine and Rehabilitation

## 2023-07-15 ENCOUNTER — Encounter: Payer: 59 | Attending: Physical Medicine and Rehabilitation | Admitting: Physical Medicine and Rehabilitation

## 2023-07-15 VITALS — BP 123/77 | HR 83 | Ht 62.0 in | Wt 170.0 lb

## 2023-07-15 DIAGNOSIS — M25552 Pain in left hip: Secondary | ICD-10-CM | POA: Insufficient documentation

## 2023-07-15 DIAGNOSIS — Z79891 Long term (current) use of opiate analgesic: Secondary | ICD-10-CM | POA: Diagnosis not present

## 2023-07-15 DIAGNOSIS — M545 Low back pain, unspecified: Secondary | ICD-10-CM | POA: Insufficient documentation

## 2023-07-15 DIAGNOSIS — M25551 Pain in right hip: Secondary | ICD-10-CM | POA: Insufficient documentation

## 2023-07-15 DIAGNOSIS — Z5181 Encounter for therapeutic drug level monitoring: Secondary | ICD-10-CM | POA: Insufficient documentation

## 2023-07-15 DIAGNOSIS — G8929 Other chronic pain: Secondary | ICD-10-CM | POA: Diagnosis present

## 2023-07-15 DIAGNOSIS — G894 Chronic pain syndrome: Secondary | ICD-10-CM | POA: Insufficient documentation

## 2023-07-15 NOTE — Patient Instructions (Signed)
Continue heat as needed for your back pain  I am sending you to PT to trial a TENS unit for use on your low back; we will order one if this is helpful  Use bengay or muscle rub on your back as needed for muscle pain  follow up with me after the holidays for trigger point injections into the low back   I will wait on tramadol refill until you are out given you are using it rarely; contact clinic when you need it!

## 2023-08-05 ENCOUNTER — Other Ambulatory Visit: Payer: Self-pay | Admitting: Physician Assistant

## 2023-08-05 ENCOUNTER — Other Ambulatory Visit: Payer: Self-pay | Admitting: Emergency Medicine

## 2023-08-05 DIAGNOSIS — G43009 Migraine without aura, not intractable, without status migrainosus: Secondary | ICD-10-CM

## 2023-08-05 DIAGNOSIS — K219 Gastro-esophageal reflux disease without esophagitis: Secondary | ICD-10-CM

## 2023-08-06 ENCOUNTER — Ambulatory Visit (INDEPENDENT_AMBULATORY_CARE_PROVIDER_SITE_OTHER): Payer: 59

## 2023-08-06 DIAGNOSIS — I255 Ischemic cardiomyopathy: Secondary | ICD-10-CM

## 2023-08-06 LAB — CUP PACEART REMOTE DEVICE CHECK
Battery Remaining Longevity: 113 mo
Battery Remaining Percentage: 91 %
Battery Voltage: 3.02 V
Brady Statistic RV Percent Paced: 1 %
Date Time Interrogation Session: 20250109011610
HighPow Impedance: 100 Ohm
Implantable Lead Connection Status: 753985
Implantable Lead Implant Date: 20130108
Implantable Lead Location: 753860
Implantable Pulse Generator Implant Date: 20240409
Lead Channel Impedance Value: 490 Ohm
Lead Channel Pacing Threshold Amplitude: 0.75 V
Lead Channel Pacing Threshold Pulse Width: 0.5 ms
Lead Channel Sensing Intrinsic Amplitude: 12 mV
Lead Channel Setting Pacing Amplitude: 2.5 V
Lead Channel Setting Pacing Pulse Width: 0.5 ms
Lead Channel Setting Sensing Sensitivity: 0.5 mV
Pulse Gen Serial Number: 211016490
Zone Setting Status: 755011

## 2023-08-10 ENCOUNTER — Encounter: Payer: 59 | Attending: Physical Medicine and Rehabilitation | Admitting: Physical Medicine and Rehabilitation

## 2023-08-10 VITALS — BP 137/83 | HR 78 | Ht 62.0 in | Wt 168.0 lb

## 2023-08-10 DIAGNOSIS — G8929 Other chronic pain: Secondary | ICD-10-CM | POA: Insufficient documentation

## 2023-08-10 DIAGNOSIS — M545 Low back pain, unspecified: Secondary | ICD-10-CM | POA: Insufficient documentation

## 2023-08-10 DIAGNOSIS — M7918 Myalgia, other site: Secondary | ICD-10-CM | POA: Diagnosis present

## 2023-08-10 MED ORDER — LIDOCAINE HCL 1 % IJ SOLN
3.0000 mL | Freq: Once | INTRAMUSCULAR | Status: AC
  Administered 2023-08-10: 3 mL

## 2023-08-10 MED ORDER — TRIAMCINOLONE ACETONIDE 40 MG/ML IJ SUSP
2.0000 mg | Freq: Once | INTRAMUSCULAR | Status: AC
  Administered 2023-08-10: 2 mg via INTRAMUSCULAR

## 2023-08-10 NOTE — Patient Instructions (Addendum)
-   Resume Usual Activities. Notify Physician of any unusual bleeding, erythema or concern for side effects as reviewed above. - Apply ice prn for pain - Tylenol  prn for pain - Follow up in  6  weeks to assess response to injection  - I recommend following up with Dr. Addie with orthopedics for hip injections and possible surgery for your hip pain - You can have trigger point injecitons done as frequently as every month if they are helpful - Call our office if any concerns or  questions regarding your trigger point injections today - Follow up with me for general check up in 6 weeks

## 2023-08-10 NOTE — Progress Notes (Signed)
 HPI:   [name] is a 78 y.o. female with PMHx has COLONIC POLYPS, ADENOMATOUS; UNSPECIFIED ANEMIA; TOBACCO ABUSE; Essential hypertension; GERD; DIVERTICULOSIS-COLON; IBS; History of colonic polyps; History of pulmonary embolus (PE); DDD (degenerative disc disease), lumbar; Implantable cardioverter-defibrillator (ICD) in situ; Pseudoaneurysm of aortic arch (HCC); Gastric polyp; Pre-diabetes; Chronic renal insufficiency; AICD (automatic cardioverter/defibrillator) present; Coronary artery disease involving native coronary artery of native heart with angina pectoris (HCC); Ischemic cardiomyopathy; COPD (chronic obstructive pulmonary disease) (HCC); Dyslipidemia; Insomnia; Thoracic aortic aneurysm without rupture (HCC); Colon polyp; Hearing loss; Mild cognitive impairment; Osteoporosis; Chronic insomnia; Stage 3b chronic kidney disease (HCC); HFrEF (heart failure with reduced ejection fraction) (HCC); UTI due to Klebsiella species; Chronic bilateral low back pain without sciatica; Spinal stenosis of lumbar region without neurogenic claudication; Pelvic pain; Stage 4 chronic kidney disease (HCC); Low back pain without sciatica; Bilateral hip pain; Constipation; Family history of colon cancer in mother; Chronic pain syndrome; and Encounter for therapeutic drug monitoring on their problem list. who presents to clinic for treatment of pain related to post-laminectomy pain syndrome and myofascial low back pain  via injection as described below.    No new concerns or complaints. No major changes in medical history since last visit. Hip and groin continues to be painful; note she was evaluated by Dr. Carilyn for possible non-steroidal injection and he recommended following up with Dr. Addie as her symptoms were only at nighttime. She says the pain is getting worse and is worse at nighttime, but occurs during the day as well and makes it very difficult for her to sleep. She says tramadol  helps but she uses it  rarely.   Physical Exam:  General: Appropriate appearance for age.  Mental Status: Appropriate mood and affect.  Cardiovascular: RRR, no m/r/g.  Respiratory: CTAB, no rales/rhonchi/wheezing.  Skin: No apparent rashes or lesions.  Neuro: Awake, alert, and oriented x3. No apparent deficits.  MSK: Moving all 4 limbs antigravity and against resistance.   PROCEDURE: Trigger point injections Diagnosis: M79.18  Goals with treatment: [x ] Decrease pain [x]  Improve Active / Passive ROM [  ] Improve ADLs [ x ] Improve functional mobility  MEDICATION:  Kenalog  40 mg/mL - 0.2 ccs Lidocaine  1% - 6 ccs   CONSENT: Obtained in writing followed by time-out per clinic policy. Consent uploaded to chart.  Benefits discussed.  Risks discussed included, but were not limited to, pain and discomfort, bleeding, bruising, allergic reaction, infection. All questions answered to patient/family member/guardian/ caregiver satisfaction. They would like to proceed with procedure. There are no noted contraindications to procedure.  PROCEDURE Time out was preformed No heat sources No antibiotics  The patient was explained about both the benefits and risks of a trigger point injection. After the patient acknowledged an understanding of the risks and benefits, the patient agreed to proceed. The area was first marked and then prepped in an aseptic fashion with betadine / alcohol. A 30 g, 1/2 inch needle was directed via a posterior approach into the right lumbar paraspinals in 3 spots. The injection was completed with Kenalog  0.2 cc mixed with 3cc of 1% lidocaine  after no blood was aspirated on pull back.  The pt tolerated the procedure well. The patient reported immediate relief of the pain and improved pain free range of motion. No complications were encountered.   Impression: HPI: [name] is a 78 y.o. female with PMHx has COLONIC POLYPS, ADENOMATOUS; UNSPECIFIED ANEMIA; TOBACCO ABUSE; Essential  hypertension; GERD; DIVERTICULOSIS-COLON; IBS; History of colonic  polyps; History of pulmonary embolus (PE); DDD (degenerative disc disease), lumbar; Implantable cardioverter-defibrillator (ICD) in situ; Pseudoaneurysm of aortic arch (HCC); Gastric polyp; Pre-diabetes; Chronic renal insufficiency; AICD (automatic cardioverter/defibrillator) present; Coronary artery disease involving native coronary artery of native heart with angina pectoris (HCC); Ischemic cardiomyopathy; COPD (chronic obstructive pulmonary disease) (HCC); Dyslipidemia; Insomnia; Thoracic aortic aneurysm without rupture (HCC); Colon polyp; Hearing loss; Mild cognitive impairment; Osteoporosis; Chronic insomnia; Stage 3b chronic kidney disease (HCC); HFrEF (heart failure with reduced ejection fraction) (HCC); UTI due to Klebsiella species; Chronic bilateral low back pain without sciatica; Spinal stenosis of lumbar region without neurogenic claudication; Pelvic pain; Stage 4 chronic kidney disease (HCC); Low back pain without sciatica; Bilateral hip pain; Constipation; Family history of colon cancer in mother; Chronic pain syndrome; and Encounter for therapeutic drug monitoring on their problem list. who presents to clinic for treatment of myofascial low back pain . They received a  Right  trigger point injections as above.   PLAN: - Resume Usual Activities. Notify Physician of any unusual bleeding, erythema or concern for side effects as reviewed above. - Apply ice prn for pain - Tylenol  prn for pain - Follow up in  6  weeks to assess response to injection  - I recommend following up with Dr. Addie with orthopedics for hip injections and possible surgery for your hip pain - You can have trigger point injecitons done as frequently as every month if they are helpful - Call our office if any concerns or  questions regarding your trigger point injections today - Follow up with me for general check up in 6 weeks  Patient/Care Giver was ready  to learn without apparent learning barriers. Education was provided on diagnosis, treatment options/plan according to patient's preferred learning style. Patient/Care Giver verbalized understanding and agreement with the above plan.   Joesph JAYSON Likes, DO 08/10/2023

## 2023-08-24 ENCOUNTER — Encounter: Payer: Self-pay | Admitting: Surgical

## 2023-08-24 ENCOUNTER — Ambulatory Visit (INDEPENDENT_AMBULATORY_CARE_PROVIDER_SITE_OTHER): Payer: 59 | Admitting: Surgical

## 2023-08-24 DIAGNOSIS — M1612 Unilateral primary osteoarthritis, left hip: Secondary | ICD-10-CM | POA: Diagnosis not present

## 2023-08-24 NOTE — Progress Notes (Signed)
Office Visit Note   Patient: [name]           Date of Birth: 11/11/1945           MRN: [id number] Visit Date: 08/24/2023 Requested by: Georgina Quint, MD [address] [place],  [place] [address] PCP: Georgina Quint, MD  Subjective: Chief Complaint  Patient presents with   Other    Bilateral hip pain    HPI: [name] is a 78 y.o. female who presents to the office reporting left hip pain.  Patient reports pain over the last few months.  Has had right hip pain with moderate relief from hip injection by Dr. August Saucer.  Never really had left hip pain until last few months.  Localizes pain to the groin with no radiation of pain except for one instance of radicular pain down to her foot about 3 weeks ago.  She has history of low back pain that she is in pain management for.  She has history of low back surgery but no prior hip surgery.  No recent falls or injuries.  Really only notices the pain severely at night which does keep her up and keep her from sleeping.  Causes some discomfort with hip flexion and ambulation but this mostly bothers her at night..                ROS: All systems reviewed are negative as they relate to the chief complaint within the history of present illness.  Patient denies fevers or chills.  Assessment & Plan: Visit Diagnoses:  1. Arthritis of left hip     Plan: Patient is a 78 year old female who presents for evaluation of primarily left hip pain.  Has pain localizing to the groin that is reproduced with hip range of motion and positive Stinchfield sign on exam today.  She has radiographs of the lumbar spine from few months ago with imaging of the left hip demonstrating mild to moderate osteoarthritis of the left hip joint.  We discussed options availed the patient.  She would like to try left hip injection.  However she did have some dizziness that lasted for about 10 to 15 minutes after her last hip injection on the right side.  She drove  herself today so we will plan to schedule left hip injection when she can find a ride and do the injection at that point.  Follow-up after she figures out when her brother can take her to the office; she will plan to call the office.  Follow-Up Instructions: No follow-ups on file.   Orders:  No orders of the defined types were placed in this encounter.  No orders of the defined types were placed in this encounter.     Procedures: No procedures performed   Clinical Data: No additional findings.  Objective: Vital Signs: There were no vitals taken for this visit.  Physical Exam:  Constitutional: Patient appears well-developed HEENT:  Head: Normocephalic Eyes:EOM are normal Neck: Normal range of motion Cardiovascular: Normal rate Pulmonary/chest: Effort normal Neurologic: Patient is alert Skin: Skin is warm Psychiatric: Patient has normal mood and affect  Ortho Exam: Ortho exam demonstrates left hip with positive FADIR sign.  Positive Stinchfield sign.  Negative straight leg raise.  Hip flexion strength rated 5/5 but does reproduce groin pain.  Quad strength 5/5.  No knee effusion noted.  No tenderness over the greater trochanter.  No calf tenderness.  Intact ankle dorsiflexion and plantarflexion rated 5/5.  Intact  hamstring strength grade 5/5.  Specialty Comments:  No specialty comments available.  Imaging: No results found.   PMFS History: Patient Active Problem List   Diagnosis Date Noted   Myofascial pain 08/10/2023   Chronic pain syndrome 05/20/2023   Encounter for therapeutic drug monitoring 05/20/2023   Constipation 05/12/2023   Family history of colon cancer in mother 05/12/2023   Low back pain without sciatica 04/16/2023   Bilateral hip pain 04/16/2023   Stage 4 chronic kidney disease (HCC) 11/25/2022   Pelvic pain 11/24/2022   UTI due to Klebsiella species 05/08/2022   Chronic bilateral low back pain without sciatica 05/08/2022   Spinal stenosis of lumbar  region without neurogenic claudication 05/08/2022   HFrEF (heart failure with reduced ejection fraction) (HCC) 12/02/2021   Stage 3b chronic kidney disease (HCC) 08/08/2021   Chronic insomnia 11/14/2019   Colon polyp 06/16/2018   Thoracic aortic aneurysm without rupture (HCC) 03/31/2018   Chronic renal insufficiency    AICD (automatic cardioverter/defibrillator) present    Ischemic cardiomyopathy    COPD (chronic obstructive pulmonary disease) (HCC)    Dyslipidemia    Insomnia    Pre-diabetes 06/19/2017   Gastric polyp    Osteoporosis 05/11/2013   Hearing loss 04/29/2013   Mild cognitive impairment 04/29/2013   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   Implantable cardioverter-defibrillator (ICD) in situ 08/06/2011   DDD (degenerative disc disease), lumbar 05/16/2011   History of pulmonary embolus (PE) 01/02/2011   UNSPECIFIED ANEMIA 07/03/2010   Essential hypertension 09/05/2009   DIVERTICULOSIS-COLON 08/14/2009   History of colonic polyps 04/24/2009   TOBACCO ABUSE 04/04/2009   Coronary artery disease involving native coronary artery of native heart with angina pectoris (HCC) 02/25/2009   GERD 10/19/2007   IBS 10/19/2007   COLONIC POLYPS, ADENOMATOUS 05/12/2007   Past Medical History:  Diagnosis Date   Abdominal pain, left lower quadrant 08/14/2009   Qualifier: Diagnosis of  By: Dorian Pod, Pam     Abnormal CT scan, stomach    Abnormal LFTs 06/19/2017   AICD (automatic cardioverter/defibrillator) present    Dr.Allred follows   Aortic arch pseudoaneurysm (HCC)    a. followed by Dr. Donata Clay.   Arthritis    Asthma    Benign neoplasm of colon    CAD (coronary artery disease) 02/2009   a. anterior STEMI rx with BMS to prox LAD in 02/2009. b. ISR s/p PTCA 06/2009. c. ISR s/p thrombectomy & PTCA 03/2010 due to late stent thrombosis. // Myoview 04/2019: EF 28, ant, ant-sept, inf-sept scar, no ischemia, high risk (stable>>cont med Rx)    Cardiomyopathy, ischemic  06/12/2011   Chronic renal insufficiency    stage 3   Chronic systolic CHF (congestive heart failure) (HCC)    a. s/p ST. Jude ICD 09/02/2011.   Chronic systolic heart failure (HCC) 08/05/2011   Colon polyp    Complication of anesthesia    COPD (chronic obstructive pulmonary disease) (HCC)    CORONARY ARTERY DISEASE 04/24/2009   Qualifier: Diagnosis of  By: Myrtie Hawk, Amy S    DDD (degenerative disc disease), lumbar 05/16/2011   Depression    "since my son died in 2023/10/31" 2017/09/01)   Diverticulosis    DIVERTICULOSIS-COLON 08/14/2009   Qualifier: Diagnosis of  By: Monica Becton PA-c, Amy S    DYSPHAGIA UNSPECIFIED 04/24/2009   Qualifier: Diagnosis of  By: Dorian Pod, Pam     Dyspnea    "when I lay down at night"    Essential hypertension 09/05/2009  Qualifier: Diagnosis of  By: Yancey Flemings CMA, Jennifer     Gastric polyp    GERD 10/19/2007   Qualifier: Diagnosis of  By: Luberta Robertson RN, Eli Phillips: Diagnosis of  By: Dorian Pod, Pam     GERD (gastroesophageal reflux disease)    GI bleed    a. h/o GIB on DAPT, now on ASA only.   Headache 01/03/2013   Hearing loss    left ear   History of pulmonary embolus (PE) 01/02/2011   Hyperlipemia    Hyperlipidemia, unspecified 04/24/2009   Qualifier: Diagnosis of  By: Monica Becton PA-c, Amy S    Hypertension    ICD-St.Jude 08/06/2011   S/p St. Jude ICD placement 08/05/11    Insomnia    Irritable bowel syndrome    Ischemic cardiomyopathy    EF 10-15%   Memory loss    Migraine headache without aura    Myocardial infarct (HCC) 2011 x 2   Dr. 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   Pulmonary embolism (HCC) 2011   Stroke [facility])    'When I was young." no residual   Thoracic aortic  aneurysm (HCC) 03/31/2018   TOBACCO ABUSE 04/04/2009   Qualifier: Diagnosis of  By: Jolene Provost  Qualifier: Diagnosis of  By: Riley Kill, MD, Johny Sax    Transfusion history    ?'12 or '13   UNSPECIFIED ANEMIA 07/03/2010   Qualifier: Diagnosis of  By: Omer Jack     Unspecified mastoiditis     Family History  Problem Relation Age of Onset   Colon cancer Mother    Other Brother        tumor in his brain   Bladder Cancer Brother    Throat cancer Brother    Cancer Son        kidney, spread to his liver   Breast cancer Cousin     Past Surgical History:  Procedure Laterality Date   ANGIOPLASTY  07/02/09, 04/01/10   BILATERAL SALPINGOOPHORECTOMY     CAD( bare metal stent)  02/2009   x 1   CAROTID-SUBCLAVIAN BYPASS GRAFT Left 03/31/2018   Procedure: LEFT SUBCLAVIAN ARTERY BYPASS GRAFT;  Surgeon: Nada Libman, MD;  Location: [facility] OR;  Service: Vascular;  Laterality: Left;   CERVICAL SPINE SURGERY  08/08   COLONOSCOPY WITH PROPOFOL N/A 04/29/2016   Procedure: COLONOSCOPY WITH PROPOFOL;  Surgeon: Ruffin Frederick, MD;  Location: [facility] ENDOSCOPY;  Service: Gastroenterology;  Laterality: N/A;   ESOPHAGOGASTRODUODENOSCOPY N/A 12/09/2016   Procedure: ESOPHAGOGASTRODUODENOSCOPY (EGD);  Surgeon: Ruffin Frederick, MD;  Location: [facility];  Service: Gastroenterology;  Laterality: N/A;   ESOPHAGOGASTRODUODENOSCOPY (EGD) WITH PROPOFOL N/A 04/29/2016   Procedure: ESOPHAGOGASTRODUODENOSCOPY (EGD) WITH PROPOFOL;  Surgeon: Ruffin Frederick, MD;  Location: [facility] ENDOS[facility];  Service: Gastroenterology;  Laterality: N/A;   EUS N/A 05/15/2016   Procedure: UPPER ENDOSCOPIC ULTRASOUND (EUS) RADIAL;  Surgeon: Rachael Fee, MD;  Location: [facility] ENDOSCOPY;  Service: Endoscopy;  Laterality: N/A;   ICD GENERATOR CHANGEOUT N/A 11/04/2022   Procedure: ICD GENERATOR CHANGEOUT;  Surgeon: Nelly Laurence, Roberts Gaudy, MD;  Location: [facility] INVASIVE CV LAB;  Service: Cardiovascular;  Laterality: N/A;    IMPLANTABLE CARDIOVERTER DEFIBRILLATOR IMPLANT N/A 08/05/2011  Primary prevention SJM ICD implanted,  Analyze ST study patient   INNER EAR SURGERY     left x 17   LUMBAR DISC SURGERY  02/2008   fusion   NASAL SEPTUM SURGERY     RIGHT HEART CATH N/A 12/11/2021   Procedure: RIGHT HEART CATH;  Surgeon: Tonny Bollman, MD;  Location: [facility] INVASIVE CV LAB;  Service: Cardiovascular;  Laterality: N/A;   THORACIC AORTIC ENDOVASCULAR STENT GRAFT N/A 03/31/2018   Procedure: THORACIC AORTIC ENDOVASCULAR STENT GRAFT;  Surgeon: Nada Libman, MD;  Location: [facility] OR;  Service: Vascular;  Laterality: N/A;   TOTAL ABDOMINAL HYSTERECTOMY     complete   Social History   Occupational History   Occupation: Designer, industrial/product: UNEMPLOYED    Employer: DISABLED  Tobacco Use   Smoking status: Former    Current packs/day: 0.13    Average packs/day: 0.1 packs/day for 30.0 years (3.9 ttl pk-yrs)    Types: Cigarettes   Smokeless tobacco: Never  Vaping Use   Vaping status: Never Used  Substance and Sexual Activity   Alcohol use: No    Alcohol/week: 0.0 standard drinks of alcohol   Drug use: No   Sexual activity: Not Currently

## 2023-08-28 ENCOUNTER — Other Ambulatory Visit: Payer: Self-pay

## 2023-08-28 ENCOUNTER — Ambulatory Visit (INDEPENDENT_AMBULATORY_CARE_PROVIDER_SITE_OTHER): Payer: 59 | Admitting: Surgical

## 2023-08-28 DIAGNOSIS — M25552 Pain in left hip: Secondary | ICD-10-CM

## 2023-08-28 DIAGNOSIS — M1612 Unilateral primary osteoarthritis, left hip: Secondary | ICD-10-CM | POA: Diagnosis not present

## 2023-08-31 ENCOUNTER — Ambulatory Visit (INDEPENDENT_AMBULATORY_CARE_PROVIDER_SITE_OTHER): Payer: 59 | Admitting: Emergency Medicine

## 2023-08-31 ENCOUNTER — Encounter: Payer: Self-pay | Admitting: Surgical

## 2023-08-31 ENCOUNTER — Encounter: Payer: Self-pay | Admitting: Emergency Medicine

## 2023-08-31 VITALS — BP 130/68 | HR 79 | Temp 98.4°F | Ht 62.0 in | Wt 170.0 lb

## 2023-08-31 DIAGNOSIS — M48061 Spinal stenosis, lumbar region without neurogenic claudication: Secondary | ICD-10-CM

## 2023-08-31 DIAGNOSIS — I5020 Unspecified systolic (congestive) heart failure: Secondary | ICD-10-CM | POA: Diagnosis not present

## 2023-08-31 DIAGNOSIS — I25119 Atherosclerotic heart disease of native coronary artery with unspecified angina pectoris: Secondary | ICD-10-CM | POA: Diagnosis not present

## 2023-08-31 DIAGNOSIS — G43009 Migraine without aura, not intractable, without status migrainosus: Secondary | ICD-10-CM

## 2023-08-31 DIAGNOSIS — N1832 Chronic kidney disease, stage 3b: Secondary | ICD-10-CM

## 2023-08-31 DIAGNOSIS — E785 Hyperlipidemia, unspecified: Secondary | ICD-10-CM | POA: Diagnosis not present

## 2023-08-31 DIAGNOSIS — J449 Chronic obstructive pulmonary disease, unspecified: Secondary | ICD-10-CM

## 2023-08-31 DIAGNOSIS — R7303 Prediabetes: Secondary | ICD-10-CM | POA: Diagnosis not present

## 2023-08-31 DIAGNOSIS — I10 Essential (primary) hypertension: Secondary | ICD-10-CM

## 2023-08-31 LAB — CBC WITH DIFFERENTIAL/PLATELET
Basophils Absolute: 0.1 K/uL (ref 0.0–0.1)
Basophils Relative: 0.5 % (ref 0.0–3.0)
Eosinophils Absolute: 0.2 K/uL (ref 0.0–0.7)
Eosinophils Relative: 2 % (ref 0.0–5.0)
HCT: 37 % (ref 36.0–46.0)
Hemoglobin: 12 g/dL (ref 12.0–15.0)
Lymphocytes Relative: 32.9 % (ref 12.0–46.0)
Lymphs Abs: 3.9 K/uL (ref 0.7–4.0)
MCHC: 32.5 g/dL (ref 30.0–36.0)
MCV: 102.2 fl — ABNORMAL HIGH (ref 78.0–100.0)
Monocytes Absolute: 0.7 K/uL (ref 0.1–1.0)
Monocytes Relative: 6 % (ref 3.0–12.0)
Neutro Abs: 7 K/uL (ref 1.4–7.7)
Neutrophils Relative %: 58.6 % (ref 43.0–77.0)
Platelets: 207 K/uL (ref 150.0–400.0)
RBC: 3.62 Mil/uL — ABNORMAL LOW (ref 3.87–5.11)
RDW: 14.6 % (ref 11.5–15.5)
WBC: 11.9 K/uL — ABNORMAL HIGH (ref 4.0–10.5)

## 2023-08-31 LAB — LIPID PANEL
Cholesterol: 133 mg/dL (ref 0–200)
HDL: 56.7 mg/dL
LDL Cholesterol: 56 mg/dL (ref 0–99)
NonHDL: 76.01
Total CHOL/HDL Ratio: 2
Triglycerides: 102 mg/dL (ref 0.0–149.0)
VLDL: 20.4 mg/dL (ref 0.0–40.0)

## 2023-08-31 LAB — COMPREHENSIVE METABOLIC PANEL WITH GFR
ALT: 12 U/L (ref 0–35)
AST: 15 U/L (ref 0–37)
Albumin: 3.9 g/dL (ref 3.5–5.2)
Alkaline Phosphatase: 80 U/L (ref 39–117)
BUN: 38 mg/dL — ABNORMAL HIGH (ref 6–23)
CO2: 22 meq/L (ref 19–32)
Calcium: 8.8 mg/dL (ref 8.4–10.5)
Chloride: 111 meq/L (ref 96–112)
Creatinine, Ser: 2.02 mg/dL — ABNORMAL HIGH (ref 0.40–1.20)
GFR: 23.61 mL/min — ABNORMAL LOW
Glucose, Bld: 115 mg/dL — ABNORMAL HIGH (ref 70–99)
Potassium: 4 meq/L (ref 3.5–5.1)
Sodium: 141 meq/L (ref 135–145)
Total Bilirubin: 0.3 mg/dL (ref 0.2–1.2)
Total Protein: 6.6 g/dL (ref 6.0–8.3)

## 2023-08-31 LAB — HEMOGLOBIN A1C: Hgb A1c MFr Bld: 6.6 % — ABNORMAL HIGH (ref 4.6–6.5)

## 2023-08-31 MED ORDER — METHYLPREDNISOLONE ACETATE 40 MG/ML IJ SUSP
40.0000 mg | INTRAMUSCULAR | Status: AC | PRN
  Administered 2023-08-28: 40 mg via INTRA_ARTICULAR

## 2023-08-31 MED ORDER — LIDOCAINE HCL 1 % IJ SOLN
5.0000 mL | INTRAMUSCULAR | Status: AC | PRN
  Administered 2023-08-28: 5 mL

## 2023-08-31 MED ORDER — AMITRIPTYLINE HCL 50 MG PO TABS: 50.0000 mg | ORAL_TABLET | Freq: Every day | ORAL | 1 refills | Status: DC

## 2023-08-31 MED ORDER — BUPIVACAINE HCL 0.25 % IJ SOLN
7.0000 mL | INTRAMUSCULAR | Status: AC | PRN
  Administered 2023-08-28: 7 mL via INTRA_ARTICULAR

## 2023-08-31 NOTE — Progress Notes (Signed)
   Procedure Note  Patient: [name]             Date of Birth: 1946-06-09           MRN: [id number]             Visit Date: 08/28/2023  Procedures: Visit Diagnoses:  1. Arthritis of left hip   2. Pain in left hip     Large Joint Inj: L hip joint on 08/28/2023 10:48 AM Indications: pain and diagnostic evaluation Details: 22 G 3.5 in needle, ultrasound-guided anterior approach Medications: 5 mL lidocaine 1 %; 7 mL bupivacaine 0.25 %; 40 mg methylPREDNISolone acetate 40 MG/ML Outcome: tolerated well, no immediate complications  Patient returns for planned left hip intra-articular injection.  Successfully administered under ultrasound guidance without complication.  She will let us know how this does for her left hip pain and we can consider doing it for the right hip if she has good relief. Procedure, treatment alternatives, risks and benefits explained, specific risks discussed. Consent was given by the patient. Immediately prior to procedure a time out was called to verify the correct patient, procedure, equipment, support staff and site/side marked as required. Patient was prepped and draped in the usual sterile fashion.

## 2023-08-31 NOTE — Patient Instructions (Signed)
 Health Maintenance After Age 76 After age 41, you are at a higher risk for certain long-term diseases and infections as well as injuries from falls. Falls are a major cause of broken bones and head injuries in people who are older than age 26. Getting regular preventive care can help to keep you healthy and well. Preventive care includes getting regular testing and making lifestyle changes as recommended by your health care provider. Talk with your health care provider about: Which screenings and tests you should have. A screening is a test that checks for a disease when you have no symptoms. A diet and exercise plan that is right for you. What should I know about screenings and tests to prevent falls? Screening and testing are the best ways to find a health problem early. Early diagnosis and treatment give you the best chance of managing medical conditions that are common after age 48. Certain conditions and lifestyle choices may make you more likely to have a fall. Your health care provider may recommend: Regular vision checks. Poor vision and conditions such as cataracts can make you more likely to have a fall. If you wear glasses, make sure to get your prescription updated if your vision changes. Medicine review. Work with your health care provider to regularly review all of the medicines you are taking, including over-the-counter medicines. Ask your health care provider about any side effects that may make you more likely to have a fall. Tell your health care provider if any medicines that you take make you feel dizzy or sleepy. Strength and balance checks. Your health care provider may recommend certain tests to check your strength and balance while standing, walking, or changing positions. Foot health exam. Foot pain and numbness, as well as not wearing proper footwear, can make you more likely to have a fall. Screenings, including: Osteoporosis screening. Osteoporosis is a condition that causes  the bones to get weaker and break more easily. Blood pressure screening. Blood pressure changes and medicines to control blood pressure can make you feel dizzy. Depression screening. You may be more likely to have a fall if you have a fear of falling, feel depressed, or feel unable to do activities that you used to do. Alcohol use screening. Using too much alcohol can affect your balance and may make you more likely to have a fall. Follow these instructions at home: Lifestyle Do not drink alcohol if: Your health care provider tells you not to drink. If you drink alcohol: Limit how much you have to: 0-1 drink a day for women. 0-2 drinks a day for men. Know how much alcohol is in your drink. In the [place] one drink equals one 12 oz bottle of beer (355 mL), one 5 oz glass of wine (148 mL), or one 1 oz glass of hard liquor (44 mL). Do not use any products that contain nicotine or tobacco. These products include cigarettes, chewing tobacco, and vaping devices, such as e-cigarettes. If you need help quitting, ask your health care provider. Activity  Follow a regular exercise program to stay fit. This will help you maintain your balance. Ask your health care provider what types of exercise are appropriate for you. If you need a cane or walker, use it as recommended by your health care provider. Wear supportive shoes that have nonskid soles. Safety  Remove any tripping hazards, such as rugs, cords, and clutter. Install safety equipment such as grab bars in bathrooms and safety rails on stairs. Keep rooms and walkways  well-lit. General instructions Talk with your health care provider about your risks for falling. Tell your health care provider if: You fall. Be sure to tell your health care provider about all falls, even ones that seem minor. You feel dizzy, tiredness (fatigue), or off-balance. Take over-the-counter and prescription medicines only as told by your health care provider. These include  supplements. Eat a healthy diet and maintain a healthy weight. A healthy diet includes low-fat dairy products, low-fat (lean) meats, and fiber from whole grains, beans, and lots of fruits and vegetables. Stay current with your vaccines. Schedule regular health, dental, and eye exams. Summary Having a healthy lifestyle and getting preventive care can help to protect your health and wellness after age 24. Screening and testing are the best way to find a health problem early and help you avoid having a fall. Early diagnosis and treatment give you the best chance for managing medical conditions that are more common for people who are older than age 81. Falls are a major cause of broken bones and head injuries in people who are older than age 75. Take precautions to prevent a fall at home. Work with your health care provider to learn what changes you can make to improve your health and wellness and to prevent falls. This information is not intended to replace advice given to you by your health care provider. Make sure you discuss any questions you have with your health care provider. Document Revised: 12/03/2020 Document Reviewed: 12/03/2020 Elsevier Patient Education  2024 [employer].

## 2023-08-31 NOTE — Assessment & Plan Note (Signed)
 Diet and nutrition discussed.

## 2023-08-31 NOTE — Progress Notes (Signed)
[name] 78 y.o.   Chief Complaint  Patient presents with   Hip Pain    Patient states getting a cortisone shot in her left hip for the pain on Friday. Wanting to get labs for her kidney function. Patient c/o of chest pain that comes and goes that started last week. No numbness, does have some dizziness but states that's not new for her.     HISTORY OF PRESENT ILLNESS: This is a 78 y.o. female concerned about her kidney function.  Requesting blood work Has had chronic left hip pain.  Was seen by orthopedist and given corticosteroid injection last Friday Has history of coronary artery disease.  Had some chest pain last week but none yesterday or today No other complaints or medical concerns today.  Hip Pain      Prior to Admission medications   Medication Sig Start Date End Date Taking? Authorizing Provider  acetaminophen (TYLENOL) 500 MG tablet Take 2 tablets (1,000 mg total) by mouth every 6 (six) hours as needed (back pain.). 08/11/22  Yes Redwine, Madison A, PA-C  aspirin 81 MG EC tablet Take 81 mg by mouth at bedtime.   Yes [provider]  bisoprolol (ZEBETA) 5 MG tablet TAKE 1 TABLET BY MOUTH DAILY. 07/07/23  Yes Tonny Bollman, MD  dexlansoprazole (DEXILANT) 60 MG capsule TAKE 1 CAPSULE (60 MG TOTAL) BY MOUTH DAILY. **PLEASE CALL OFFICE TO SCHEDULE FOLLOW UP 08/05/23  Yes Esterwood, Amy S, PA-C  furosemide (LASIX) 40 MG tablet Take 1 tablet (40 mg total) by mouth daily. Patient taking differently: Take 40 mg by mouth at bedtime. 12/09/21  Yes Weaver, Scott T, PA-C  hydrALAZINE (APRESOLINE) 10 MG tablet TAKE 1 TABLET (10 MG TOTAL) BY MOUTH 3 (THREE) TIMES DAILY. 09/12/22 09/12/23 Yes Weaver, Scott T, PA-C  polyethylene glycol powder (GLYCOLAX/MIRALAX) powder Take 17 g by mouth daily as needed for mild constipation. 07/08/17  Yes Lezlie Lye, Meda Coffee, MD  PREMARIN vaginal cream Place 0.5 g vaginally daily as needed (irritation.). 10/03/22  Yes [provider]   rosuvastatin (CRESTOR) 20 MG tablet TAKE 1 TABLET (20 MG TOTAL) BY MOUTH AT BEDTIME. 07/07/23  Yes Tonny Bollman, MD  STIOLTO RESPIMAT 2.5-2.5 MCG/ACT AERS INHALE 2 PUFFS INTO THE LUNGS DAILY. NEED APPOINTMENT FOR FURTHER REFILLS 02/13/23  Yes Hunsucker, Lesia Sago, MD  topiramate (TOPAMAX) 50 MG tablet Take 1 tablet (50 mg total) by mouth daily. 11/24/22  Yes Benjamyn Hestand, Eilleen Kempf, MD  traMADol (ULTRAM) 50 MG tablet Take 1 tablet (50 mg total) by mouth daily as needed. Do not fill until 11/22 06/19/23  Yes Engler, Lequita Halt C, DO  amitriptyline (ELAVIL) 50 MG tablet Take 1 tablet (50 mg total) by mouth at bedtime. 08/31/23   Georgina Quint, MD  traMADol (ULTRAM) 50 MG tablet Take 1 tablet (50 mg total) by mouth daily as needed. Patient not taking: Reported on 08/31/2023 05/20/23   Elijah Birk C, DO    Allergies  Allergen Reactions   Sulfa Antibiotics Other (See Comments)    Unknown, childhood allergy    Sulfonamide Derivatives Other (See Comments)    UNSURE   Zestril [Lisinopril] Cough    Patient Active Problem List   Diagnosis Date Noted   Myofascial pain 08/10/2023   Chronic pain syndrome 05/20/2023   Encounter for therapeutic drug monitoring 05/20/2023   Constipation 05/12/2023   Family history of colon cancer in mother 05/12/2023   Low back pain without sciatica 04/16/2023   Bilateral hip pain 04/16/2023  Stage 4 chronic kidney disease (HCC) 11/25/2022   Pelvic pain 11/24/2022   UTI due to Klebsiella species 05/08/2022   Chronic bilateral low back pain without sciatica 05/08/2022   Spinal stenosis of lumbar region without neurogenic claudication 05/08/2022   HFrEF (heart failure with reduced ejection fraction) (HCC) 12/02/2021   Stage 3b chronic kidney disease (HCC) 08/08/2021   Chronic insomnia 11/14/2019   Colon polyp 06/16/2018   Thoracic aortic aneurysm without rupture (HCC) 03/31/2018   Chronic renal insufficiency    AICD (automatic cardioverter/defibrillator)  present    Ischemic cardiomyopathy    COPD (chronic obstructive pulmonary disease) (HCC)    Dyslipidemia    Insomnia    Pre-diabetes 06/19/2017   Gastric polyp    Osteoporosis 05/11/2013   Hearing loss 04/29/2013   Mild cognitive impairment 04/29/2013   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   Implantable cardioverter-defibrillator (ICD) in situ 08/06/2011   DDD (degenerative disc disease), lumbar 05/16/2011   History of pulmonary embolus (PE) 01/02/2011   UNSPECIFIED ANEMIA 07/03/2010   Essential hypertension 09/05/2009   DIVERTICULOSIS-COLON 08/14/2009   History of colonic polyps 04/24/2009   TOBACCO ABUSE 04/04/2009   Coronary artery disease involving native coronary artery of native heart with angina pectoris (HCC) 02/25/2009   GERD 10/19/2007   IBS 10/19/2007   COLONIC POLYPS, ADENOMATOUS 05/12/2007    Past Medical History:  Diagnosis Date   Abdominal pain, left lower quadrant 08/14/2009   Qualifier: Diagnosis of  By: Dorian Pod, Pam     Abnormal CT scan, stomach    Abnormal LFTs 06/19/2017   AICD (automatic cardioverter/defibrillator) present    Dr.Allred follows   Aortic arch pseudoaneurysm (HCC)    a. followed by Dr. Donata Clay.   Arthritis    Asthma    Benign neoplasm of colon    CAD (coronary artery disease) 02/2009   a. anterior STEMI rx with BMS to prox LAD in 02/2009. b. ISR s/p PTCA 06/2009. c. ISR s/p thrombectomy & PTCA 03/2010 due to late stent thrombosis. // Myoview 04/2019: EF 28, ant, ant-sept, inf-sept scar, no ischemia, high risk (stable>>cont med Rx)    Cardiomyopathy, ischemic 06/12/2011   Chronic renal insufficiency    stage 3   Chronic systolic CHF (congestive heart failure) (HCC)    a. s/p ST. Jude ICD 2011-09-26.   Chronic systolic heart failure (HCC) 08/05/2011   Colon polyp    Complication of anesthesia    COPD (chronic obstructive pulmonary disease) (HCC)    CORONARY ARTERY DISEASE 04/24/2009   Qualifier: Diagnosis of  By: Myrtie Hawk,  Amy S    DDD (degenerative disc disease), lumbar 05/16/2011   Depression    "since my son died in 11/24/2023" 09-25-2017)   Diverticulosis    DIVERTICULOSIS-COLON 08/14/2009   Qualifier: Diagnosis of  By: Monica Becton PA-c, Amy S    DYSPHAGIA UNSPECIFIED 04/24/2009   Qualifier: Diagnosis of  By: Dorian Pod, Pam     Dyspnea    "when I lay down at night"    Essential hypertension 09/05/2009   Qualifier: Diagnosis of  By: Yancey Flemings CMA, Jennifer     Gastric polyp    GERD 10/19/2007   Qualifier: Diagnosis of  By: Luberta Robertson RN, Eli Phillips: Diagnosis of  By: Dorian Pod, Pam     GERD (gastroesophageal reflux disease)    GI bleed    a. h/o GIB on DAPT, now on ASA only.   Headache 01/03/2013   Hearing loss    left ear  History of pulmonary embolus (PE) 01/02/2011   Hyperlipemia    Hyperlipidemia, unspecified 04/24/2009   Qualifier: Diagnosis of  By: Monica Becton PA-c, Amy S    Hypertension    ICD-St.Jude 08/06/2011   S/p St. Jude ICD placement 08/05/11    Insomnia    Irritable bowel syndrome    Ischemic cardiomyopathy    EF 10-15%   Memory loss    Migraine headache without aura    Myocardial infarct (HCC) 2011 x 2   Dr. 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   Pulmonary embolism (HCC) 2011   Stroke [facility])    'When I was young." no residual   Thoracic aortic aneurysm (HCC) 03/31/2018   TOBACCO ABUSE 04/04/2009   Qualifier: Diagnosis of  By: Jolene Provost  Qualifier: Diagnosis of  By: Riley Kill, MD, Johny Sax    Transfusion history    ?'12 or '13   UNSPECIFIED ANEMIA 07/03/2010   Qualifier: Diagnosis of  By: Omer Jack     Unspecified mastoiditis     Past Surgical History:  Procedure Laterality Date    ANGIOPLASTY  07/02/09, 04/01/10   BILATERAL SALPINGOOPHORECTOMY     CAD( bare metal stent)  02/2009   x 1   CAROTID-SUBCLAVIAN BYPASS GRAFT Left 03/31/2018   Procedure: LEFT SUBCLAVIAN ARTERY BYPASS GRAFT;  Surgeon: Nada Libman, MD;  Location: [facility] OR;  Service: Vascular;  Laterality: Left;   CERVICAL SPINE SURGERY  08/08   COLONOSCOPY WITH PROPOFOL N/A 04/29/2016   Procedure: COLONOSCOPY WITH PROPOFOL;  Surgeon: Ruffin Frederick, MD;  Location: [facility] ENDOSCOPY;  Service: Gastroenterology;  Laterality: N/A;   ESOPHAGOGASTRODUODENOSCOPY N/A 12/09/2016   Procedure: ESOPHAGOGASTRODUODENOSCOPY (EGD);  Surgeon: Ruffin Frederick, MD;  Location: [facility];  Service: Gastroenterology;  Laterality: N/A;   ESOPHAGOGASTRODUODENOSCOPY (EGD) WITH PROPOFOL N/A 04/29/2016   Procedure: ESOPHAGOGASTRODUODENOSCOPY (EGD) WITH PROPOFOL;  Surgeon: Ruffin Frederick, MD;  Location: [facility] ENDOSCOPY;  Service: Gastroenterology;  Laterality: N/A;   EUS N/A 05/15/2016   Procedure: UPPER ENDOSCOPIC ULTRASOUND (EUS) RADIAL;  Surgeon: Rachael Fee, MD;  Location: [facility] ENDOSCOPY;  Service: Endoscopy;  Laterality: N/A;   ICD GENERATOR CHANGEOUT N/A 11/04/2022   Procedure: ICD GENERATOR CHANGEOUT;  Surgeon: Nelly Laurence, Roberts Gaudy, MD;  Location: [facility] INVASIVE CV LAB;  Service: Cardiovascular;  Laterality: N/A;   IMPLANTABLE CARDIOVERTER DEFIBRILLATOR IMPLANT N/A 08/05/2011   Primary prevention SJM ICD implanted,  Analyze ST study patient   INNER EAR SURGERY     left x 17   LUMBAR DISC SURGERY  02/2008   fusion   NASAL SEPTUM SURGERY     RIGHT HEART CATH N/A 12/11/2021   Procedure: RIGHT HEART CATH;  Surgeon: Tonny Bollman, MD;  Location: [facility] INVASIVE CV LAB;  Service: Cardiovascular;  Laterality: N/A;   THORACIC AORTIC ENDOVASCULAR STENT GRAFT N/A 03/31/2018   Procedure: THORACIC AORTIC ENDOVASCULAR STENT GRAFT;  Surgeon: Nada Libman, MD;  Location: [facility] OR;  Service: Vascular;  Laterality: N/A;   TOTAL ABDOMINAL  HYSTERECTOMY     complete    Social History   Socioeconomic History   Marital status: Divorced    Spouse name: Not on file   Number of children: 2   Years  of education: 11   Highest education level: 12th grade  Occupational History   Occupation: Designer, industrial/product: UNEMPLOYED    Employer: DISABLED  Tobacco Use   Smoking status: Former    Current packs/day: 0.13    Average packs/day: 0.1 packs/day for 30.0 years (3.9 ttl pk-yrs)    Types: Cigarettes   Smokeless tobacco: Never  Vaping Use   Vaping status: Never Used  Substance and Sexual Activity   Alcohol use: No    Alcohol/week: 0.0 standard drinks of alcohol   Drug use: No   Sexual activity: Not Currently  Other Topics Concern   Not on file  Social History Narrative   Pt lives in [place] alone.  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   Patient has 11 th grade education.Right handed.   Caffeine- one cup daily         Social Drivers of Health   Financial Resource Strain: Low Risk  (02/17/2023)   Overall Financial Resource Strain (CARDIA)    Difficulty of Paying Living Expenses: Not very hard  Recent Concern: Financial Resource Strain - Medium Risk (11/20/2022)   Overall Financial Resource Strain (CARDIA)    Difficulty of Paying Living Expenses: Somewhat hard  Food Insecurity: No Food Insecurity (02/17/2023)   Hunger Vital Sign    Worried About Running Out of Food in the Last Year: Never true    Ran Out of Food in the Last Year: Never true  Transportation Needs: No Transportation Needs (02/17/2023)   PRAPARE - Administrator, Civil Service (Medical): No    Lack of Transportation (Non-Medical): No  Physical Activity: Inactive (02/17/2023)   Exercise Vital Sign    Days of Exercise per Week: 0 days    Minutes of Exercise per Session: 0 min  Stress: No Stress Concern Present (02/17/2023)   [employer] of Occupational Health - Occupational Stress Questionnaire    Feeling of Stress : Not  at all  Recent Concern: Stress - Stress Concern Present (11/20/2022)   [employer] of Occupational Health - Occupational Stress Questionnaire    Feeling of Stress : To some extent  Social Connections: Socially Isolated (02/17/2023)   Social Connection and Isolation Panel [NHANES]    Frequency of Communication with Friends and Family: Never    Frequency of Social Gatherings with Friends and Family: Never    Attends Religious Services: 1 to 4 times per year    Active Member of [employer] or Organizations: No    Attends Banker Meetings: Never    Marital Status: Divorced  Catering manager Violence: Not At Risk (02/17/2023)   Humiliation, Afraid, Rape, and Kick questionnaire    Fear of Current or Ex-Partner: No    Emotionally Abused: No    Physically Abused: No    Sexually Abused: No    Family History  Problem Relation Age of Onset   Colon cancer Mother    Other Brother        tumor in his brain   Bladder Cancer Brother    Throat cancer Brother    Cancer Son        kidney, spread to his liver   Breast cancer Cousin      Review of Systems  Constitutional: Negative.  Negative for chills and fever.  HENT: Negative.  Negative for congestion and sore throat.   Respiratory: Negative.  Negative for cough and shortness of breath.   Cardiovascular: Negative.  Negative for chest pain and  palpitations.  Gastrointestinal:  Negative for abdominal pain, diarrhea, nausea and vomiting.  Genitourinary: Negative.  Negative for dysuria and hematuria.  Musculoskeletal:  Positive for joint pain.  Skin: Negative.  Negative for rash.  Neurological: Negative.  Negative for dizziness and headaches.  All other systems reviewed and are negative.   Vitals:   08/31/23 1317  BP: 130/68  Pulse: 79  Temp: 98.4 F (36.9 C)  SpO2: 95%    Physical Exam Vitals reviewed.  Constitutional:      Appearance: Normal appearance.  HENT:     Head: Normocephalic.     Mouth/Throat:     Mouth:  Mucous membranes are moist.     Pharynx: Oropharynx is clear.  Eyes:     Extraocular Movements: Extraocular movements intact.     Pupils: Pupils are equal, round, and reactive to light.  Cardiovascular:     Rate and Rhythm: Normal rate and regular rhythm.     Pulses: Normal pulses.     Heart sounds: Normal heart sounds.  Pulmonary:     Effort: Pulmonary effort is normal.     Breath sounds: Normal breath sounds.  Abdominal:     Palpations: Abdomen is soft.     Tenderness: There is no abdominal tenderness.  Musculoskeletal:     Cervical back: No tenderness.     Right lower leg: No edema.     Left lower leg: No edema.  Lymphadenopathy:     Cervical: No cervical adenopathy.  Skin:    General: Skin is warm and dry.  Neurological:     Mental Status: She is alert and oriented to person, place, and time.  Psychiatric:        Mood and Affect: Mood normal.        Behavior: Behavior normal.      ASSESSMENT & PLAN: A total of 44 minutes was spent with the patient and counseling/coordination of care regarding preparing for this visit, review of most recent office visit notes, review of multiple chronic medical conditions and their management, review of all medications, review of most recent bloodwork results, review of health maintenance items, education on nutrition, prognosis, documentation, and need for follow up.   Problem List Items Addressed This Visit       Cardiovascular and Mediastinum   Coronary artery disease involving native coronary artery of native heart with angina pectoris (HCC) (Chronic)   Anginal episode 1 or 2 weeks ago.  None recently Continues bisoprolol 5 mg daily and daily baby aspirin      HFrEF (heart failure with reduced ejection fraction) (HCC) (Chronic)   Continues bisoprolol 5 mg daily and Lasix 40 mg daily Clinically stable.  No signs of acute CHF Normovolemic      Essential hypertension - Primary     Respiratory   COPD (chronic obstructive  pulmonary disease) (HCC)   Stable chronic condition Continues Stiolto Respimat 2 puffs daily        Genitourinary   Stage 3b chronic kidney disease (HCC)   Advised to stay well-hydrated and avoid NSAIDs Blood work done today to reassess GFR        Other   Pre-diabetes   Diet and nutrition discussed      Dyslipidemia   Diet and nutrition discussed Continue rosuvastatin 20 mg daily      Spinal stenosis of lumbar region without neurogenic claudication   Creating chronic lumbar pain Tramadol helps Advised to take sparingly as needed Advised to avoid NSAIDs Recently referred to pain  management clinic      Other Visit Diagnoses       Migraine without aura and without status migrainosus, not intractable       Relevant Medications   amitriptyline (ELAVIL) 50 MG tablet      Patient Instructions  Health Maintenance After Age 36 After age 15, you are at a higher risk for certain long-term diseases and infections as well as injuries from falls. Falls are a major cause of broken bones and head injuries in people who are older than age 35. Getting regular preventive care can help to keep you healthy and well. Preventive care includes getting regular testing and making lifestyle changes as recommended by your health care provider. Talk with your health care provider about: Which screenings and tests you should have. A screening is a test that checks for a disease when you have no symptoms. A diet and exercise plan that is right for you. What should I know about screenings and tests to prevent falls? Screening and testing are the best ways to find a health problem early. Early diagnosis and treatment give you the best chance of managing medical conditions that are common after age 70. Certain conditions and lifestyle choices may make you more likely to have a fall. Your health care provider may recommend: Regular vision checks. Poor vision and conditions such as cataracts can make you  more likely to have a fall. If you wear glasses, make sure to get your prescription updated if your vision changes. Medicine review. Work with your health care provider to regularly review all of the medicines you are taking, including over-the-counter medicines. Ask your health care provider about any side effects that may make you more likely to have a fall. Tell your health care provider if any medicines that you take make you feel dizzy or sleepy. Strength and balance checks. Your health care provider may recommend certain tests to check your strength and balance while standing, walking, or changing positions. Foot health exam. Foot pain and numbness, as well as not wearing proper footwear, can make you more likely to have a fall. Screenings, including: Osteoporosis screening. Osteoporosis is a condition that causes the bones to get weaker and break more easily. Blood pressure screening. Blood pressure changes and medicines to control blood pressure can make you feel dizzy. Depression screening. You may be more likely to have a fall if you have a fear of falling, feel depressed, or feel unable to do activities that you used to do. Alcohol use screening. Using too much alcohol can affect your balance and may make you more likely to have a fall. Follow these instructions at home: Lifestyle Do not drink alcohol if: Your health care provider tells you not to drink. If you drink alcohol: Limit how much you have to: 0-1 drink a day for women. 0-2 drinks a day for men. Know how much alcohol is in your drink. In the [place] one drink equals one 12 oz bottle of beer (355 mL), one 5 oz glass of wine (148 mL), or one 1 oz glass of hard liquor (44 mL). Do not use any products that contain nicotine or tobacco. These products include cigarettes, chewing tobacco, and vaping devices, such as e-cigarettes. If you need help quitting, ask your health care provider. Activity  Follow a regular exercise program to  stay fit. This will help you maintain your balance. Ask your health care provider what types of exercise are appropriate for you. If you need a  cane or walker, use it as recommended by your health care provider. Wear supportive shoes that have nonskid soles. Safety  Remove any tripping hazards, such as rugs, cords, and clutter. Install safety equipment such as grab bars in bathrooms and safety rails on stairs. Keep rooms and walkways well-lit. General instructions Talk with your health care provider about your risks for falling. Tell your health care provider if: You fall. Be sure to tell your health care provider about all falls, even ones that seem minor. You feel dizzy, tiredness (fatigue), or off-balance. Take over-the-counter and prescription medicines only as told by your health care provider. These include supplements. Eat a healthy diet and maintain a healthy weight. A healthy diet includes low-fat dairy products, low-fat (lean) meats, and fiber from whole grains, beans, and lots of fruits and vegetables. Stay current with your vaccines. Schedule regular health, dental, and eye exams. Summary Having a healthy lifestyle and getting preventive care can help to protect your health and wellness after age 24. Screening and testing are the best way to find a health problem early and help you avoid having a fall. Early diagnosis and treatment give you the best chance for managing medical conditions that are more common for people who are older than age 54. Falls are a major cause of broken bones and head injuries in people who are older than age 45. Take precautions to prevent a fall at home. Work with your health care provider to learn what changes you can make to improve your health and wellness and to prevent falls. This information is not intended to replace advice given to you by your health care provider. Make sure you discuss any questions you have with your health care  provider. Document Revised: 12/03/2020 Document Reviewed: 12/03/2020 Elsevier Patient Education  2024 Elsevier Inc.     Edwina Barth, MD [facility]

## 2023-08-31 NOTE — Assessment & Plan Note (Signed)
Advised to stay well-hydrated and avoid NSAIDs Blood work done today to reassess GFR

## 2023-08-31 NOTE — Assessment & Plan Note (Signed)
Continues bisoprolol 5 mg daily and Lasix 40 mg daily Clinically stable.  No signs of acute CHF Normovolemic

## 2023-08-31 NOTE — Assessment & Plan Note (Signed)
Creating chronic lumbar pain Tramadol helps Advised to take sparingly as needed Advised to avoid NSAIDs Recently referred to pain management clinic

## 2023-08-31 NOTE — Assessment & Plan Note (Signed)
Anginal episode 1 or 2 weeks ago.  None recently Continues bisoprolol 5 mg daily and daily baby aspirin

## 2023-08-31 NOTE — Assessment & Plan Note (Signed)
 Diet and nutrition discussed.  Continue rosuvastatin 20 mg daily.

## 2023-08-31 NOTE — Assessment & Plan Note (Signed)
Stable chronic condition Continues Stiolto Respimat 2 puffs daily

## 2023-09-15 NOTE — Progress Notes (Signed)
 Remote ICD transmission.

## 2023-09-21 ENCOUNTER — Encounter: Payer: 59 | Attending: Physical Medicine and Rehabilitation | Admitting: Physical Medicine and Rehabilitation

## 2023-09-21 VITALS — BP 117/78 | HR 77 | Ht 62.0 in | Wt 166.0 lb

## 2023-09-21 DIAGNOSIS — M545 Low back pain, unspecified: Secondary | ICD-10-CM | POA: Insufficient documentation

## 2023-09-21 DIAGNOSIS — G894 Chronic pain syndrome: Secondary | ICD-10-CM | POA: Insufficient documentation

## 2023-09-21 DIAGNOSIS — G8929 Other chronic pain: Secondary | ICD-10-CM | POA: Diagnosis present

## 2023-09-21 DIAGNOSIS — M7918 Myalgia, other site: Secondary | ICD-10-CM | POA: Diagnosis present

## 2023-09-21 DIAGNOSIS — M25551 Pain in right hip: Secondary | ICD-10-CM | POA: Diagnosis present

## 2023-09-21 MED ORDER — TRAMADOL HCL 50 MG PO TABS: 50.0000 mg | ORAL_TABLET | Freq: Every day | ORAL | 1 refills | Status: AC | PRN

## 2023-09-21 MED ORDER — DICLOFENAC SODIUM 1 % EX GEL: 2.0000 g | Freq: Two times a day (BID) | CUTANEOUS | 5 refills | Status: DC | PRN

## 2023-09-21 NOTE — Progress Notes (Signed)
 Subjective:    Patient ID: [name], female    DOB: 12-Sep-1945, 78 y.o.   MRN: [id number]  HPI  [name] is a 78 y.o. year old female  who  has a past medical history of Abdominal pain, left lower quadrant (08/14/2009), Abnormal CT scan, stomach, Abnormal LFTs (06/19/2017), AICD (automatic cardioverter/defibrillator) present, Aortic arch pseudoaneurysm (HCC), Arthritis, Asthma, Benign neoplasm of colon, CAD (coronary artery disease) (02/2009), Cardiomyopathy, ischemic (06/12/2011), Chronic renal insufficiency, Chronic systolic CHF (congestive heart failure) (HCC), Chronic systolic heart failure (HCC) (4/0/9811), Colon polyp, Complication of anesthesia, COPD (chronic obstructive pulmonary disease) (HCC), CORONARY ARTERY DISEASE (04/24/2009), DDD (degenerative disc disease), lumbar (05/16/2011), Depression, Diverticulosis, DIVERTICULOSIS-COLON (08/14/2009), DYSPHAGIA UNSPECIFIED (04/24/2009), Dyspnea, Essential hypertension (09/05/2009), Gastric polyp, GERD (10/19/2007), GERD (gastroesophageal reflux disease), GI bleed, Headache (01/03/2013), Hearing loss, History of pulmonary embolus (PE) (01/02/2011), Hyperlipemia, Hyperlipidemia, unspecified (04/24/2009), Hypertension, ICD-St.Jude (08/06/2011), Insomnia, Irritable bowel syndrome, Ischemic cardiomyopathy, Memory loss, Migraine headache without aura, Myocardial infarct (HCC) (2011 x 2), PERSONAL HX COLONIC POLYPS (04/24/2009), Pneumonia, PONV (postoperative nausea and vomiting), Pre-diabetes, Pseudoaneurysm of aortic arch (HCC) (05/05/2012), Pulmonary embolism (HCC) (2011), Stroke [facility]), Thoracic aortic aneurysm (HCC) (03/31/2018), TOBACCO ABUSE (04/04/2009), Transfusion history, UNSPECIFIED ANEMIA (07/03/2010), and Unspecified mastoiditis.   They are presenting to PM&R clinic for follow up related to bilateral low back and right hip pain. .  .  Plan from last visit: - Resume Usual Activities. Notify Physician of any unusual bleeding, erythema or concern for  side effects as reviewed above. - Apply ice prn for pain - Tylenol prn for pain - Follow up in  6  weeks to assess response to injection  - I recommend following up with Dr. August Saucer with orthopedics for hip injections and possible surgery for your hip pain - You can have trigger point injecitons done as frequently as every month if they are helpful - Call our office if any concerns or  questions regarding your trigger point injections today - Follow up with me for general check up in 6 weeks       Chronic pain syndrome Encounter for therapeutic drug monitoring Encounter for long-term opiate analgesic use Using tramadol 50 mg as needed with good result, intermittently with Tylenol.  Is afraid of high medication burden, does not wish to change medication at this time.   I will wait on tramadol refill until you are out given you are using it rarely; contact clinic when you need it!   Follow-up for trigger point injections as below. Chronic right-sided low back pain without sciatica Bilateral hip pain -     Ambulatory referral to Physical Therapy Continue heat as needed for your back pain   I am sending you to PT to trial a TENS unit for use on your low back; we will order one if this is helpful   Use bengay or muscle rub on your back as needed for muscle pain   follow up with me after the holidays for trigger point injections into the low back  Interval Hx:  - Therapies: No home exercises, no community activity. Does her own ADLs "but it is very slow, I have to sit down a lot". Pain in back is worse after she has started being active; better with rest and first thing in the morning.    - Follow ups: Dr. August Saucer with Ortho gave her a hip injection which 100% relieved her hip pain, has been lasting for 2-3 weeks without side effect.    -  Falls: none   - DME: uses a walker intermittently   - Medications: Last tramadol filled 05/20/23; "I try not to take them as much as possible, I only  really take them for pain when I'm walking." No side effects. She goes a few days to a week without medication at a time, she uses a heating pad which helps. Does not take OTC medications except a rare tylenol.   "There was a cream I used a long time ago that worked really well, I'm not sure what it was".    - Other concerns: She only gets 2-3 hours of sleep a night; does not use sleep aides, never been evaluated for apnea. "It's always been like that".   Pain Inventory Average Pain 10 Pain Right Now 6 My pain is sharp and stabbing  In the last 24 hours, has pain interfered with the following? General activity 0 Relation with others 0 Enjoyment of life 0 What TIME of day is your pain at its worst? daytime Sleep (in general) Good  Pain is worse with: walking, standing, and some activites Pain improves with: rest, heat/ice, pacing activities, and medication Relief from Meds: 8  Family History  Problem Relation Age of Onset   Colon cancer Mother    Other Brother        tumor in his brain   Bladder Cancer Brother    Throat cancer Brother    Cancer Son        kidney, spread to his liver   Breast cancer Cousin    Social History   Socioeconomic History   Marital status: Divorced    Spouse name: Not on file   Number of children: 2   Years of education: 11   Highest education level: 12th grade  Occupational History   Occupation: Designer, industrial/product: UNEMPLOYED    Employer: DISABLED  Tobacco Use   Smoking status: Former    Current packs/day: 0.13    Average packs/day: 0.1 packs/day for 30.0 years (3.9 ttl pk-yrs)    Types: Cigarettes   Smokeless tobacco: Never  Vaping Use   Vaping status: Never Used  Substance and Sexual Activity   Alcohol use: No    Alcohol/week: 0.0 standard drinks of alcohol   Drug use: No   Sexual activity: Not Currently  Other Topics Concern   Not on file  Social History Narrative   Pt lives in [place] alone.  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   Patient has 11 th grade education.Right handed.   Caffeine- one cup daily         Social Drivers of Health   Financial Resource Strain: Low Risk  (02/17/2023)   Overall Financial Resource Strain (CARDIA)    Difficulty of Paying Living Expenses: Not very hard  Recent Concern: Financial Resource Strain - Medium Risk (11/20/2022)   Overall Financial Resource Strain (CARDIA)    Difficulty of Paying Living Expenses: Somewhat hard  Food Insecurity: No Food Insecurity (02/17/2023)   Hunger Vital Sign    Worried About Running Out of Food in the Last Year: Never true    Ran Out of Food in the Last Year: Never true  Transportation Needs: No Transportation Needs (02/17/2023)   PRAPARE - Administrator, Civil Service (Medical): No    Lack of Transportation (Non-Medical): No  Physical Activity: Inactive (02/17/2023)   Exercise Vital Sign    Days of Exercise per Week: 0 days    Minutes  of Exercise per Session: 0 min  Stress: No Stress Concern Present (02/17/2023)   [employer] of Occupational Health - Occupational Stress Questionnaire    Feeling of Stress : Not at all  Recent Concern: Stress - Stress Concern Present (11/20/2022)   [employer] of Occupational Health - Occupational Stress Questionnaire    Feeling of Stress : To some extent  Social Connections: Socially Isolated (02/17/2023)   Social Connection and Isolation Panel [NHANES]    Frequency of Communication with Friends and Family: Never    Frequency of Social Gatherings with Friends and Family: Never    Attends Religious Services: 1 to 4 times per year    Active Member of [employer] or Organizations: No    Attends Banker Meetings: Never    Marital Status: Divorced   Past Surgical History:  Procedure Laterality Date   ANGIOPLASTY  07/02/09, 04/01/10   BILATERAL SALPINGOOPHORECTOMY     CAD( bare metal stent)  02/2009   x 1   CAROTID-SUBCLAVIAN BYPASS GRAFT Left 03/31/2018   Procedure:  LEFT SUBCLAVIAN ARTERY BYPASS GRAFT;  Surgeon: Nada Libman, MD;  Location: [facility] OR;  Service: Vascular;  Laterality: Left;   CERVICAL SPINE SURGERY  08/08   COLONOSCOPY WITH PROPOFOL N/A 04/29/2016   Procedure: COLONOSCOPY WITH PROPOFOL;  Surgeon: Ruffin Frederick, MD;  Location: [facility];  Service: Gastroenterology;  Laterality: N/A;   ESOPHAGOGASTRODUODENOSCOPY N/A 12/09/2016   Procedure: ESOPHAGOGASTRODUODENOSCOPY (EGD);  Surgeon: Ruffin Frederick, MD;  Location: [facility];  Service: Gastroenterology;  Laterality: N/A;   ESOPHAGOGASTRODUODENOSCOPY (EGD) WITH PROPOFOL N/A 04/29/2016   Procedure: ESOPHAGOGASTRODUODENOSCOPY (EGD) WITH PROPOFOL;  Surgeon: Ruffin Frederick, MD;  Location: [facility];  Service: Gastroenterology;  Laterality: N/A;   EUS N/A 05/15/2016   Procedure: UPPER ENDOSCOPIC ULTRASOUND (EUS) RADIAL;  Surgeon: Rachael Fee, MD;  Location: [facility];  Service: Endoscopy;  Laterality: N/A;   ICD GENERATOR CHANGEOUT N/A 11/04/2022   Procedure: ICD GENERATOR CHANGEOUT;  Surgeon: Nelly Laurence, Roberts Gaudy, MD;  Location: [facility] INVASIVE CV LAB;  Service: Cardiovascular;  Laterality: N/A;   IMPLANTABLE CARDIOVERTER DEFIBRILLATOR IMPLANT N/A 08/05/2011   Primary prevention SJM ICD implanted,  Analyze ST study patient   INNER EAR SURGERY     left x 17   LUMBAR DISC SURGERY  02/2008   fusion   NASAL SEPTUM SURGERY     RIGHT HEART CATH N/A 12/11/2021   Procedure: RIGHT HEART CATH;  Surgeon: Tonny Bollman, MD;  Location: [facility] INVASIVE CV LAB;  Service: Cardiovascular;  Laterality: N/A;   THORACIC AORTIC ENDOVASCULAR STENT GRAFT N/A 03/31/2018   Procedure: THORACIC AORTIC ENDOVASCULAR STENT GRAFT;  Surgeon: Nada Libman, MD;  Location: [facility] OR;  Service: Vascular;  Laterality: N/A;   TOTAL ABDOMINAL HYSTERECTOMY     complete   Past Surgical History:  Procedure Laterality Date   ANGIOPLASTY  07/02/09, 04/01/10   BILATERAL SALPINGOOPHORECTOMY     CAD( bare metal  stent)  02/2009   x 1   CAROTID-SUBCLAVIAN BYPASS GRAFT Left 03/31/2018   Procedure: LEFT SUBCLAVIAN ARTERY BYPASS GRAFT;  Surgeon: Nada Libman, MD;  Location: [facility] OR;  Service: Vascular;  Laterality: Left;   CERVICAL SPINE SURGERY  08/08   COLONOSCOPY WITH PROPOFOL N/A 04/29/2016   Procedure: COLONOSCOPY WITH PROPOFOL;  Surgeon: Ruffin Frederick, MD;  Location: [facility];  Service: Gastroenterology;  Laterality: N/A;   ESOPHAGOGASTRODUODENOSCOPY N/A 12/09/2016   Procedure: ESOPHAGOGASTRODUODENOSCOPY (EGD);  Surgeon: Ruffin Frederick, MD;  Location: [facility];  Service: Gastroenterology;  Laterality: N/A;   ESOPHAGOGASTRODUODENOSCOPY (EGD) WITH PROPOFOL N/A 04/29/2016   Procedure: ESOPHAGOGASTRODUODENOSCOPY (EGD) WITH PROPOFOL;  Surgeon: Ruffin Frederick, MD;  Location: [facility];  Service: Gastroenterology;  Laterality: N/A;   EUS N/A 05/15/2016   Procedure: UPPER ENDOSCOPIC ULTRASOUND (EUS) RADIAL;  Surgeon: Rachael Fee, MD;  Location: [facility];  Service: Endoscopy;  Laterality: N/A;   ICD GENERATOR CHANGEOUT N/A 11/04/2022   Procedure: ICD GENERATOR CHANGEOUT;  Surgeon: Nelly Laurence, Roberts Gaudy, MD;  Location: [facility] INVASIVE CV LAB;  Service: Cardiovascular;  Laterality: N/A;   IMPLANTABLE CARDIOVERTER DEFIBRILLATOR IMPLANT N/A 08/05/2011   Primary prevention SJM ICD implanted,  Analyze ST study patient   INNER EAR SURGERY     left x 17   LUMBAR DISC SURGERY  02/2008   fusion   NASAL SEPTUM SURGERY     RIGHT HEART CATH N/A 12/11/2021   Procedure: RIGHT HEART CATH;  Surgeon: Tonny Bollman, MD;  Location: [facility] INVASIVE CV LAB;  Service: Cardiovascular;  Laterality: N/A;   THORACIC AORTIC ENDOVASCULAR STENT GRAFT N/A 03/31/2018   Procedure: THORACIC AORTIC ENDOVASCULAR STENT GRAFT;  Surgeon: Nada Libman, MD;  Location: [facility] OR;  Service: Vascular;  Laterality: N/A;   TOTAL ABDOMINAL HYSTERECTOMY     complete   Past Medical History:  Diagnosis Date   Abdominal  pain, left lower quadrant 08/14/2009   Qualifier: Diagnosis of  By: Dorian Pod, Pam     Abnormal CT scan, stomach    Abnormal LFTs 06/19/2017   AICD (automatic cardioverter/defibrillator) present    Dr.Allred follows   Aortic arch pseudoaneurysm (HCC)    a. followed by Dr. Donata Clay.   Arthritis    Asthma    Benign neoplasm of colon    CAD (coronary artery disease) 02/2009   a. anterior STEMI rx with BMS to prox LAD in 02/2009. b. ISR s/p PTCA 06/2009. c. ISR s/p thrombectomy & PTCA 03/2010 due to late stent thrombosis. // Myoview 04/2019: EF 28, ant, ant-sept, inf-sept scar, no ischemia, high risk (stable>>cont med Rx)    Cardiomyopathy, ischemic 06/12/2011   Chronic renal insufficiency    stage 3   Chronic systolic CHF (congestive heart failure) (HCC)    a. s/p ST. Jude ICD 10/20/11.   Chronic systolic heart failure (HCC) 08/05/2011   Colon polyp    Complication of anesthesia    COPD (chronic obstructive pulmonary disease) (HCC)    CORONARY ARTERY DISEASE 04/24/2009   Qualifier: Diagnosis of  By: Myrtie Hawk, Amy S    DDD (degenerative disc disease), lumbar 05/16/2011   Depression    "since my son died in 11-20-2023" 10-19-17)   Diverticulosis    DIVERTICULOSIS-COLON 08/14/2009   Qualifier: Diagnosis of  By: Monica Becton PA-c, Amy S    DYSPHAGIA UNSPECIFIED 04/24/2009   Qualifier: Diagnosis of  By: Dorian Pod, Pam     Dyspnea    "when I lay down at night"    Essential hypertension 09/05/2009   Qualifier: Diagnosis of  By: Yancey Flemings CMA, Jennifer     Gastric polyp    GERD 10/19/2007   Qualifier: Diagnosis of  By: Luberta Robertson RN, Eli Phillips: Diagnosis of  By: Dorian Pod, Pam     GERD (gastroesophageal reflux disease)    GI bleed    a. h/o GIB on DAPT, now on ASA only.   Headache 01/03/2013   Hearing loss    left ear   History of pulmonary embolus (PE) 01/02/2011   Hyperlipemia  Hyperlipidemia, unspecified 04/24/2009   Qualifier: Diagnosis of  By: Monica Becton PA-c, Amy S    Hypertension     ICD-St.Jude 08/06/2011   S/p St. Jude ICD placement 08/05/11    Insomnia    Irritable bowel syndrome    Ischemic cardiomyopathy    EF 10-15%   Memory loss    Migraine headache without aura    Myocardial infarct (HCC) 2011 x 2   Dr. 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   Pulmonary embolism (HCC) 2011   Stroke [facility])    'When I was young." no residual   Thoracic aortic aneurysm (HCC) 03/31/2018   TOBACCO ABUSE 04/04/2009   Qualifier: Diagnosis of  By: Jolene Provost  Qualifier: Diagnosis of  By: Riley Kill, MD, Johny Sax    Transfusion history    ?'12 or '13   UNSPECIFIED ANEMIA 07/03/2010   Qualifier: Diagnosis of  By: Omer Jack     Unspecified mastoiditis    BP 117/78   Pulse 77   Ht 5\' 2"  (1.575 m)   Wt 166 lb (75.3 kg)   SpO2 97%   BMI 30.36 kg/m   Opioid Risk Score:   Fall Risk Score:  `1  Depression screen PHQ 2/9     08/31/2023    1:24 PM 08/10/2023    1:57 PM 07/15/2023    1:53 PM 06/02/2023    2:28 PM 05/20/2023   11:26 AM 04/20/2023   10:07 AM 04/16/2023    2:45 PM  Depression screen PHQ 2/9  Decreased Interest 0 0 0 0 0 0 0  Down, Depressed, Hopeless 0 0 0 0 0 0 0  PHQ - 2 Score 0 0 0 0 0 0 0  Altered sleeping     3  1  Tired, decreased energy     0  0  Change in appetite     0  0  Feeling bad or failure about yourself      0  0  Trouble concentrating     3  0  Moving slowly or fidgety/restless     0  0  Suicidal thoughts     0  0  PHQ-9 Score     6  1  Difficult doing work/chores       Not difficult at all     Review of Systems  Musculoskeletal:  Positive for back pain.  All other systems reviewed and are negative.     Objective:   Physical  Exam  PE: Constitution: Appropriate appearance for age. No apparent distress  Resp: No respiratory distress. No accessory muscle usage. on RA and CTAB Cardio: Well perfused appearance. No peripheral edema. Abdomen: Nondistended. Nontender.   Psych: Appropriate mood and affect. Anxious, pressured speech.  Neuro: AAOx4. No apparent cognitive deficits    Neurologic Exam:   DTRs: Reflexes were 2+ in bilateral achilles, patella, biceps, BR and triceps. Babinsky: flexor responses b/l.   Hoffmans: negative b/l Sensory exam: revealed normal sensation in all dermatomal regions in bilateral lower extremities Motor exam: strength 4/5 throughout bilateral lower extremities Coordination: Fine motor coordination was normal.   Gait: +antalgic gait     Back Exam:   Inspection:  Pelvis was  even.  Lumbar lordotic curvature was  reduced .   Palpation: Palpatory exam revealed exquisite ttp at the bilateral lumbar paraspinals, PSIS R>L, and psoas/quadratus .+  spasm and radiation of pain into flanks and ribs with light touch. .    ROM revealed restricted ROM in low back, bilateral hip extension . Special/provocative testing:    SLR: -   Slump test: -   Facet loading: +   TTP at paraspinals: +      Assessment & Plan:   [name] is a 78 y.o. year old female  who  has a past medical history of Abdominal pain, left lower quadrant (08/14/2009), Abnormal CT scan, stomach, Abnormal LFTs (06/19/2017), AICD (automatic cardioverter/defibrillator) present, Aortic arch pseudoaneurysm (HCC), Arthritis, Asthma, Benign neoplasm of colon, CAD (coronary artery disease) (02/2009), Cardiomyopathy, ischemic (06/12/2011), Chronic renal insufficiency, Chronic systolic CHF (congestive heart failure) (HCC), Chronic systolic heart failure (HCC) (08/02/1094), Colon polyp, Complication of anesthesia, COPD (chronic obstructive pulmonary disease) (HCC), CORONARY ARTERY DISEASE (04/24/2009), DDD (degenerative disc disease),  lumbar (05/16/2011), Depression, Diverticulosis, DIVERTICULOSIS-COLON (08/14/2009), DYSPHAGIA UNSPECIFIED (04/24/2009), Dyspnea, Essential hypertension (09/05/2009), Gastric polyp, GERD (10/19/2007), GERD (gastroesophageal reflux disease), GI bleed, Headache (01/03/2013), Hearing loss, History of pulmonary embolus (PE) (01/02/2011), Hyperlipemia, Hyperlipidemia, unspecified (04/24/2009), Hypertension, ICD-St.Jude (08/06/2011), Insomnia, Irritable bowel syndrome, Ischemic cardiomyopathy, Memory loss, Migraine headache without aura, Myocardial infarct (HCC) (2011 x 2), PERSONAL HX COLONIC POLYPS (04/24/2009), Pneumonia, PONV (postoperative nausea and vomiting), Pre-diabetes, Pseudoaneurysm of aortic arch (HCC) (05/05/2012), Pulmonary embolism (HCC) (2011), Stroke [facility]), Thoracic aortic aneurysm (HCC) (03/31/2018), TOBACCO ABUSE (04/04/2009), Transfusion history, UNSPECIFIED ANEMIA (07/03/2010), and Unspecified mastoiditis.    They are presenting to PM&R clinic for follow up related to bilateral low back and right hip pain.    Chronic pain syndrome Today, I have refilled the tramadol with 45 tabs to distribute over 90 days.  I have given you 1 refill.  Right hip pain Chronic bilateral low back pain without sciatica Myofascial pain  I am prescribing Voltaren 1% topical gel to use on your low back and hips up to twice daily for pain.  You can continue to use heat to assist with pain management as well.  You can take this with Tylenol.  Continue hip injections with orthopedics  Follow-up with me in 6 months, or sooner if needed.  Other orders -     Diclofenac Sodium; Apply 2 g topically 2 (two) times daily as needed (apply to low back  and/or hips for pain).  Dispense: 200 g; Refill: 5 -     traMADol HCl; Take 1 tablet (50 mg total) by mouth daily as needed. Do not fill until 09/21/23  Dispense: 45 tablet; Refill: 1

## 2023-09-21 NOTE — Patient Instructions (Signed)
 Today, I have refilled the tramadol with 45 tabs to distribute over 90 days.  I have given you 1 refill.  I am prescribing Voltaren 1% topical gel to use on your low back and hips up to twice daily for pain.  You can continue to use heat to assist with pain management as well.  You can take this with Tylenol.  Continue hip injections with orthopedics  Follow-up with me in 6 months, or sooner if needed.

## 2023-11-05 ENCOUNTER — Ambulatory Visit: Payer: 59

## 2023-11-05 DIAGNOSIS — I255 Ischemic cardiomyopathy: Secondary | ICD-10-CM | POA: Diagnosis not present

## 2023-11-09 LAB — CUP PACEART REMOTE DEVICE CHECK
Battery Remaining Longevity: 110 mo
Battery Remaining Percentage: 89 %
Battery Voltage: 3.01 V
Brady Statistic RV Percent Paced: 1 %
Date Time Interrogation Session: 20250410030023
HighPow Impedance: 82 Ohm
Implantable Lead Connection Status: 753985
Implantable Lead Implant Date: 20130108
Implantable Lead Location: 753860
Implantable Pulse Generator Implant Date: 20240409
Lead Channel Impedance Value: 490 Ohm
Lead Channel Pacing Threshold Amplitude: 0.75 V
Lead Channel Pacing Threshold Pulse Width: 0.5 ms
Lead Channel Sensing Intrinsic Amplitude: 12 mV
Lead Channel Setting Pacing Amplitude: 2.5 V
Lead Channel Setting Pacing Pulse Width: 0.5 ms
Lead Channel Setting Sensing Sensitivity: 0.5 mV
Pulse Gen Serial Number: 211016490
Zone Setting Status: 755011

## 2023-11-17 ENCOUNTER — Other Ambulatory Visit: Payer: Self-pay | Admitting: Emergency Medicine

## 2023-12-14 NOTE — Progress Notes (Signed)
 Remote ICD transmission.

## 2023-12-14 NOTE — Addendum Note (Signed)
 Addended by: Lott Rouleau A on: 12/14/2023 01:14 PM   Modules accepted: Orders

## 2023-12-23 ENCOUNTER — Ambulatory Visit (INDEPENDENT_AMBULATORY_CARE_PROVIDER_SITE_OTHER): Admitting: Emergency Medicine

## 2023-12-23 ENCOUNTER — Encounter: Payer: Self-pay | Admitting: Emergency Medicine

## 2023-12-23 VITALS — BP 140/86 | HR 78 | Temp 98.4°F | Ht 62.0 in | Wt 167.0 lb

## 2023-12-23 DIAGNOSIS — H9202 Otalgia, left ear: Secondary | ICD-10-CM | POA: Diagnosis not present

## 2023-12-23 DIAGNOSIS — G8929 Other chronic pain: Secondary | ICD-10-CM

## 2023-12-23 NOTE — Assessment & Plan Note (Signed)
 Chronic recurrent problem with history of surgeries in the past Pain management discussed Has decreased hearing.  Recommend ENT evaluation Referral placed today. Differential diagnosis discussed.  Clinically stable.

## 2023-12-23 NOTE — Telephone Encounter (Signed)
 Please see message . Thank you .

## 2023-12-23 NOTE — Progress Notes (Signed)
 [name] 78 y.o.   Chief Complaint  Patient presents with   Ear Pain    Patient states she's been having this right ear and neck pressure for about 6 months. Does cause her to have some nausea, some dizziness, pain does come and goes muffled sounding. She has been putting a heating pad on it which helps also using hand pressure on it helps     HISTORY OF PRESENT ILLNESS: This is a 78 y.o. female complaining of left-sided ear problems for the past 6 months.  Has history of surgery in the same ear multiple times in the past Complains of pressure inside the ear with occasional nausea and dizziness.  Decreased hearing from that ear.  No other associated symptoms No other complaints or medical concerns today.  HPI   Prior to Admission medications   Medication Sig Start Date End Date Taking? Authorizing Provider  acetaminophen  (TYLENOL ) 500 MG tablet Take 2 tablets (1,000 mg total) by mouth every 6 (six) hours as needed (back pain.). 08/11/22  Yes Redwine, Madison A, PA-C  amitriptyline  (ELAVIL ) 50 MG tablet Take 1 tablet (50 mg total) by mouth at bedtime. 08/31/23  Yes SagardiaIsidro Margo, MD  aspirin  81 MG EC tablet Take 81 mg by mouth at bedtime.   Yes [provider]  bisoprolol  (ZEBETA ) 5 MG tablet TAKE 1 TABLET BY MOUTH DAILY. 07/07/23  Yes Arnoldo Lapping, MD  dexlansoprazole  (DEXILANT ) 60 MG capsule TAKE 1 CAPSULE (60 MG TOTAL) BY MOUTH DAILY. **PLEASE CALL OFFICE TO SCHEDULE FOLLOW UP 08/05/23  Yes Esterwood, Amy S, PA-C  diclofenac  Sodium (VOLTAREN  ARTHRITIS PAIN) 1 % GEL Apply 2 g topically 2 (two) times daily as needed (apply to low back  and/or hips for pain). 09/21/23  Yes Cherri Corns C, DO  furosemide  (LASIX ) 40 MG tablet Take 1 tablet (40 mg total) by mouth daily. Patient taking differently: Take 40 mg by mouth at bedtime. 12/09/21  Yes Weaver, Scott T, PA-C  polyethylene glycol powder (GLYCOLAX /MIRALAX ) powder Take 17 g by mouth daily as needed for mild  constipation. 07/08/17  Yes Elyce Hams, Marguerita Shih, MD  PREMARIN  vaginal cream Place 0.5 g vaginally daily as needed (irritation.). 10/03/22  Yes [provider]  rosuvastatin  (CRESTOR ) 20 MG tablet TAKE 1 TABLET (20 MG TOTAL) BY MOUTH AT BEDTIME. 07/07/23  Yes Arnoldo Lapping, MD  STIOLTO RESPIMAT  2.5-2.5 MCG/ACT AERS INHALE 2 PUFFS INTO THE LUNGS DAILY. NEED APPOINTMENT FOR FURTHER REFILLS 02/13/23  Yes Hunsucker, Archer Kobs, MD  topiramate  (TOPAMAX ) 50 MG tablet TAKE 1 TABLET (50 MG TOTAL) BY MOUTH DAILY. 11/17/23  Yes Emory Leaver, Isidro Margo, MD  hydrALAZINE  (APRESOLINE ) 10 MG tablet TAKE 1 TABLET (10 MG TOTAL) BY MOUTH 3 (THREE) TIMES DAILY. 09/12/22 09/12/23  Marlyse Single T, PA-C    Allergies  Allergen Reactions   Sulfa Antibiotics Other (See Comments)    Unknown, childhood allergy    Sulfonamide Derivatives Other (See Comments)    UNSURE   Zestril  [Lisinopril ] Cough    Patient Active Problem List   Diagnosis Date Noted   Chronic pain syndrome 05/20/2023   Constipation 05/12/2023   Family history of colon cancer in mother 05/12/2023   Bilateral hip pain 04/16/2023   Stage 4 chronic kidney disease (HCC) 11/25/2022   UTI due to Klebsiella species 05/08/2022   Chronic bilateral low back pain without sciatica 05/08/2022   Spinal stenosis of lumbar region without neurogenic claudication 05/08/2022   HFrEF (heart failure with reduced ejection fraction) (  HCC) 12/02/2021   Stage 3b chronic kidney disease (HCC) 08/08/2021   Chronic insomnia 11/14/2019   Colon polyp 06/16/2018   Thoracic aortic aneurysm without rupture (HCC) 03/31/2018   Chronic renal insufficiency    AICD (automatic cardioverter/defibrillator) present    Ischemic cardiomyopathy    COPD (chronic obstructive pulmonary disease) (HCC)    Dyslipidemia    Insomnia    Pre-diabetes 06/19/2017   Gastric polyp    Osteoporosis 05/11/2013   Hearing loss 04/29/2013   Mild cognitive impairment 04/29/2013   Pseudoaneurysm of  aortic arch (HCC) 05/05/2012   Implantable cardioverter-defibrillator (ICD) in situ 08/06/2011   DDD (degenerative disc disease), lumbar 05/16/2011   History of pulmonary embolus (PE) 01/02/2011   UNSPECIFIED ANEMIA 07/03/2010   Essential hypertension 09/05/2009   DIVERTICULOSIS-COLON 08/14/2009   History of colonic polyps 04/24/2009   TOBACCO ABUSE 04/04/2009   Coronary artery disease involving native coronary artery of native heart with angina pectoris (HCC) 02/25/2009   GERD 10/19/2007   IBS 10/19/2007   COLONIC POLYPS, ADENOMATOUS 05/12/2007    Past Medical History:  Diagnosis Date   Abdominal pain, left lower quadrant 08/14/2009   Qualifier: Diagnosis of  By: Tonita Frater, Pam     Abnormal CT scan, stomach    Abnormal LFTs 06/19/2017   AICD (automatic cardioverter/defibrillator) present    Dr.Allred follows   Aortic arch pseudoaneurysm (HCC)    a. followed by Dr. Matt Song.   Arthritis    Asthma    Benign neoplasm of colon    CAD (coronary artery disease) 02/2009   a. anterior STEMI rx with BMS to prox LAD in 02/2009. b. ISR s/p PTCA 06/2009. c. ISR s/p thrombectomy & PTCA 03/2010 due to late stent thrombosis. // Myoview  04/2019: EF 28, ant, ant-sept, inf-sept scar, no ischemia, high risk (stable>>cont med Rx)    Cardiomyopathy, ischemic 06/12/2011   Chronic renal insufficiency    stage 3   Chronic systolic CHF (congestive heart failure) (HCC)    a. s/p ST. Jude ICD 01-18-2012.   Chronic systolic heart failure (HCC) 08/05/2011   Colon polyp    Complication of anesthesia    COPD (chronic obstructive pulmonary disease) (HCC)    CORONARY ARTERY DISEASE 04/24/2009   Qualifier: Diagnosis of  By: Shelva Dice, Amy S    DDD (degenerative disc disease), lumbar 05/16/2011   Depression    "since my son died in Nov 18, 2023" 01/17/2018)   Diverticulosis    DIVERTICULOSIS-COLON 08/14/2009   Qualifier: Diagnosis of  By: Francisca Irvine PA-c, Amy S    DYSPHAGIA UNSPECIFIED 04/24/2009   Qualifier: Diagnosis  of  By: Tonita Frater, Pam     Dyspnea    "when I lay down at night"    Essential hypertension 09/05/2009   Qualifier: Diagnosis of  By: Jens Molder CMA, Jennifer     Gastric polyp    GERD 10/19/2007   Qualifier: Diagnosis of  By: Margaretmary Shaver RN, Victor Grapes: Diagnosis of  By: Tonita Frater, Pam     GERD (gastroesophageal reflux disease)    GI bleed    a. h/o GIB on DAPT, now on ASA only.   Headache 01/03/2013   Hearing loss    left ear   History of pulmonary embolus (PE) 01/02/2011   Hyperlipemia    Hyperlipidemia, unspecified 04/24/2009   Qualifier: Diagnosis of  By: Francisca Irvine PA-c, Amy S    Hypertension    ICD-St.Jude 08/06/2011   S/p St. Jude ICD placement 08/05/11    Insomnia  Irritable bowel syndrome    Ischemic cardiomyopathy    EF 10-15%   Memory loss    Migraine headache without aura    Myocardial infarct (HCC) 2011 x 2   Dr. Estanislao Heimlich    PERSONAL HX COLONIC POLYPS 04/24/2009   Qualifier: Diagnosis of  By: Shelva Dice, Amy S November, 2011 colonoscopy demonstrated a sessile cecal polyp and sigmoid polyp    Pneumonia    PONV (postoperative nausea and vomiting)    Pre-diabetes    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   Pulmonary embolism (HCC) 2011   Stroke [facility])    'When I was young." no residual   Thoracic aortic aneurysm (HCC) 03/31/2018   TOBACCO ABUSE 04/04/2009   Qualifier: Diagnosis of  By: Lavonia Powers  Qualifier: Diagnosis of  By: Judy Null, MD, Daralyn Earl    Transfusion history    ?'12 or '13   UNSPECIFIED ANEMIA 07/03/2010   Qualifier: Diagnosis of  By: Ramona Burner     Unspecified mastoiditis     Past Surgical History:  Procedure Laterality Date   ANGIOPLASTY  07/02/09, 04/01/10   BILATERAL SALPINGOOPHORECTOMY     CAD( bare metal stent)  02/2009   x 1   CAROTID-SUBCLAVIAN BYPASS GRAFT Left 03/31/2018   Procedure: LEFT SUBCLAVIAN ARTERY BYPASS GRAFT;  Surgeon: Margherita Shell, MD;  Location: [facility] OR;  Service: Vascular;   Laterality: Left;   CERVICAL SPINE SURGERY  08/08   COLONOSCOPY WITH PROPOFOL  N/A 04/29/2016   Procedure: COLONOSCOPY WITH PROPOFOL ;  Surgeon: Danette Duos, MD;  Location: [facility] ENDOSCOPY;  Service: Gastroenterology;  Laterality: N/A;   ESOPHAGOGASTRODUODENOSCOPY N/A 12/09/2016   Procedure: ESOPHAGOGASTRODUODENOSCOPY (EGD);  Surgeon: Danette Duos, MD;  Location: [facility];  Service: Gastroenterology;  Laterality: N/A;   ESOPHAGOGASTRODUODENOSCOPY (EGD) WITH PROPOFOL  N/A 04/29/2016   Procedure: ESOPHAGOGASTRODUODENOSCOPY (EGD) WITH PROPOFOL ;  Surgeon: Danette Duos, MD;  Location: [facility] ENDOSCOPY;  Service: Gastroenterology;  Laterality: N/A;   EUS N/A 05/15/2016   Procedure: UPPER ENDOSCOPIC ULTRASOUND (EUS) RADIAL;  Surgeon: Janel Medford, MD;  Location: [facility] ENDOSCOPY;  Service: Endoscopy;  Laterality: N/A;   ICD GENERATOR CHANGEOUT N/A 11/04/2022   Procedure: ICD GENERATOR CHANGEOUT;  Surgeon: Arlester Ladd, Donnamae Gaba, MD;  Location: [facility] INVASIVE CV LAB;  Service: Cardiovascular;  Laterality: N/A;   IMPLANTABLE CARDIOVERTER DEFIBRILLATOR IMPLANT N/A 08/05/2011   Primary prevention SJM ICD implanted,  Analyze ST study patient   INNER EAR SURGERY     left x 17   LUMBAR DISC SURGERY  02/2008   fusion   NASAL SEPTUM SURGERY     RIGHT HEART CATH N/A 12/11/2021   Procedure: RIGHT HEART CATH;  Surgeon: Arnoldo Lapping, MD;  Location: [facility] INVASIVE CV LAB;  Service: Cardiovascular;  Laterality: N/A;   THORACIC AORTIC ENDOVASCULAR STENT GRAFT N/A 03/31/2018   Procedure: THORACIC AORTIC ENDOVASCULAR STENT GRAFT;  Surgeon: Margherita Shell, MD;  Location: [facility] OR;  Service: Vascular;  Laterality: N/A;   TOTAL ABDOMINAL HYSTERECTOMY     complete    Social History   Socioeconomic History   Marital status: Divorced    Spouse name: Not on file   Number of children: 2   Years of education: 11   Highest education level: 12th grade  Occupational History   Occupation: Therapist, music: UNEMPLOYED    Employer: DISABLED  Tobacco Use   Smoking status: Former    Current packs/day: 0.13    Average packs/day: 0.1 packs/day for 30.0  years (3.9 ttl pk-yrs)    Types: Cigarettes   Smokeless tobacco: Never  Vaping Use   Vaping status: Never Used  Substance and Sexual Activity   Alcohol use: No    Alcohol/week: 0.0 standard drinks of alcohol   Drug use: No   Sexual activity: Not Currently  Other Topics Concern   Not on file  Social History Narrative   Pt lives in [place] alone.  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   Patient has 11 th grade education.Right handed.   Caffeine - one cup daily         Social Drivers of Health   Financial Resource Strain: Low Risk  (02/17/2023)   Overall Financial Resource Strain (CARDIA)    Difficulty of Paying Living Expenses: Not very hard  Recent Concern: Financial Resource Strain - Medium Risk (11/20/2022)   Overall Financial Resource Strain (CARDIA)    Difficulty of Paying Living Expenses: Somewhat hard  Food Insecurity: No Food Insecurity (02/17/2023)   Hunger Vital Sign    Worried About Running Out of Food in the Last Year: Never true    Ran Out of Food in the Last Year: Never true  Transportation Needs: No Transportation Needs (02/17/2023)   PRAPARE - Administrator, Civil Service (Medical): No    Lack of Transportation (Non-Medical): No  Physical Activity: Inactive (02/17/2023)   Exercise Vital Sign    Days of Exercise per Week: 0 days    Minutes of Exercise per Session: 0 min  Stress: No Stress Concern Present (02/17/2023)   [employer] of Occupational Health - Occupational Stress Questionnaire    Feeling of Stress : Not at all  Recent Concern: Stress - Stress Concern Present (11/20/2022)   [employer] of Occupational Health - Occupational Stress Questionnaire    Feeling of Stress : To some extent  Social Connections: Socially Isolated (02/17/2023)   Social Connection and Isolation  Panel [NHANES]    Frequency of Communication with Friends and Family: Never    Frequency of Social Gatherings with Friends and Family: Never    Attends Religious Services: 1 to 4 times per year    Active Member of [employer] or Organizations: No    Attends Banker Meetings: Never    Marital Status: Divorced  Catering manager Violence: Not At Risk (02/17/2023)   Humiliation, Afraid, Rape, and Kick questionnaire    Fear of Current or Ex-Partner: No    Emotionally Abused: No    Physically Abused: No    Sexually Abused: No    Family History  Problem Relation Age of Onset   Colon cancer Mother    Other Brother        tumor in his brain   Bladder Cancer Brother    Throat cancer Brother    Cancer Son        kidney, spread to his liver   Breast cancer Cousin      Review of Systems  Constitutional: Negative.  Negative for chills and fever.  HENT:  Positive for ear pain and hearing loss.   Respiratory: Negative.  Negative for cough and shortness of breath.   Cardiovascular: Negative.  Negative for chest pain and palpitations.  Gastrointestinal:  Negative for abdominal pain, diarrhea, nausea and vomiting.  Genitourinary: Negative.  Negative for dysuria and hematuria.  Skin: Negative.  Negative for rash.  Neurological: Negative.  Negative for dizziness and headaches.  All other systems reviewed and are negative.   Vitals:  12/23/23 1351  BP: (!) 140/86  Pulse: 78  Temp: 98.4 F (36.9 C)  SpO2: 96%    Physical Exam Vitals reviewed.  Constitutional:      Appearance: Normal appearance.  HENT:     Head: Normocephalic.     Right Ear: Tympanic membrane, ear canal and external ear normal.     Left Ear: Tympanic membrane, ear canal and external ear normal.     Mouth/Throat:     Mouth: Mucous membranes are moist.     Pharynx: Oropharynx is clear.  Eyes:     Extraocular Movements: Extraocular movements intact.     Pupils: Pupils are equal, round, and reactive to light.   Cardiovascular:     Rate and Rhythm: Normal rate and regular rhythm.     Pulses: Normal pulses.     Heart sounds: Normal heart sounds.  Pulmonary:     Effort: Pulmonary effort is normal.     Breath sounds: Normal breath sounds.  Musculoskeletal:     Cervical back: No tenderness.  Lymphadenopathy:     Cervical: No cervical adenopathy.  Skin:    General: Skin is warm and dry.     Capillary Refill: Capillary refill takes less than 2 seconds.  Neurological:     Mental Status: She is alert and oriented to person, place, and time.  Psychiatric:        Mood and Affect: Mood normal.        Behavior: Behavior normal.      ASSESSMENT & PLAN: A total of 32 minutes was spent with the patient and counseling/coordination of care regarding preparing for this visit, review of most recent office visit notes, review of multiple chronic medical conditions under management, review of all medications, differential diagnosis of chronic left ear pain and need for ENT evaluation, pain management, prognosis, documentation and need for follow-up.  Problem List Items Addressed This Visit       Other   Chronic ear pain, left - Primary   Chronic recurrent problem with history of surgeries in the past Pain management discussed Has decreased hearing.  Recommend ENT evaluation Referral placed today. Differential diagnosis discussed.  Clinically stable.      Relevant Orders   Ambulatory referral to ENT   Patient Instructions  Earache, Adult An earache, or ear pain, can be caused by many things, including: An infection. Ear wax buildup. Ear pressure. Something in the ear that should not be there (foreign body). A sore throat. Tooth problems. Jaw problems. Treatment of the earache will depend on the cause. If the cause is not clear or cannot be known, you may need to watch your symptoms until your earache goes away or until a cause is found. Follow these instructions at home: Medicines Take or  apply over-the-counter and prescription medicines only as told by your health care provider. If you were prescribed antibiotics, use them as told by your health care provider. Do not stop using the antibiotic even if you start to feel better. Do not put anything in your ear other than medicine that is prescribed by your health care provider. Managing pain     If directed, apply heat to the affected area as often as told by your health care provider. Use the heat source that your health care provider recommends, such as a moist heat pack or a heating pad. Place a towel between your skin and the heat source. Leave the heat on for 20-30 minutes. If your skin turns bright red,  remove the heat right away to prevent burns. The risk of burns is higher if you cannot feel pain, heat, or cold. If directed, put ice on the affected area. To do this: Put ice in a plastic bag. Place a towel between your skin and the bag. Leave the ice on for 20 minutes, 2-3 times a day. If your skin turns bright red, remove the ice right away to prevent skin damage. The risk of skin damage is higher if you cannot feel pain, heat, or cold.  General instructions Pay attention to any changes in your symptoms. Try resting in an upright position instead of lying down. This may help to reduce pressure in your ear and relieve pain. Chew gum if it helps to relieve your ear pain. Treat any allergies as told by your health care provider. Drink enough fluid to keep your urine pale yellow. It is up to you to get the results of any tests that were done. Ask your health care provider, or the department that is doing the tests, when your results will be ready. Contact a health care provider if: Your pain does not improve within 2 days. Your earache gets worse. You have new symptoms. You have a fever. Get help right away if: You have a severe headache. You have a stiff neck. You have trouble swallowing. You have redness or  swelling behind your ear. You have fluid or blood coming from your ear. You have hearing loss. You feel dizzy. This information is not intended to replace advice given to you by your health care provider. Make sure you discuss any questions you have with your health care provider. Document Revised: 11/25/2021 Document Reviewed: 11/25/2021 Elsevier Patient Education  2024 Elsevier Inc.   Maryagnes Small, MD [facility]

## 2023-12-23 NOTE — Patient Instructions (Signed)
Earache, Adult An earache, or ear pain, can be caused by many things, including: An infection. Ear wax buildup. Ear pressure. Something in the ear that should not be there (foreign body). A sore throat. Tooth problems. Jaw problems. Treatment of the earache will depend on the cause. If the cause is not clear or cannot be known, you may need to watch your symptoms until your earache goes away or until a cause is found. Follow these instructions at home: Medicines Take or apply over-the-counter and prescription medicines only as told by your health care provider. If you were prescribed antibiotics, use them as told by your health care provider. Do not stop using the antibiotic even if you start to feel better. Do not put anything in your ear other than medicine that is prescribed by your health care provider. Managing pain     If directed, apply heat to the affected area as often as told by your health care provider. Use the heat source that your health care provider recommends, such as a moist heat pack or a heating pad. Place a towel between your skin and the heat source. Leave the heat on for 20-30 minutes. If your skin turns bright red, remove the heat right away to prevent burns. The risk of burns is higher if you cannot feel pain, heat, or cold. If directed, put ice on the affected area. To do this: Put ice in a plastic bag. Place a towel between your skin and the bag. Leave the ice on for 20 minutes, 2-3 times a day. If your skin turns bright red, remove the ice right away to prevent skin damage. The risk of skin damage is higher if you cannot feel pain, heat, or cold.  General instructions Pay attention to any changes in your symptoms. Try resting in an upright position instead of lying down. This may help to reduce pressure in your ear and relieve pain. Chew gum if it helps to relieve your ear pain. Treat any allergies as told by your health care provider. Drink enough fluid  to keep your urine pale yellow. It is up to you to get the results of any tests that were done. Ask your health care provider, or the department that is doing the tests, when your results will be ready. Contact a health care provider if: Your pain does not improve within 2 days. Your earache gets worse. You have new symptoms. You have a fever. Get help right away if: You have a severe headache. You have a stiff neck. You have trouble swallowing. You have redness or swelling behind your ear. You have fluid or blood coming from your ear. You have hearing loss. You feel dizzy. This information is not intended to replace advice given to you by your health care provider. Make sure you discuss any questions you have with your health care provider. Document Revised: 11/25/2021 Document Reviewed: 11/25/2021 Elsevier Patient Education  2024 Elsevier Inc.  

## 2023-12-29 ENCOUNTER — Telehealth (INDEPENDENT_AMBULATORY_CARE_PROVIDER_SITE_OTHER): Payer: Self-pay | Admitting: Physician Assistant

## 2023-12-29 ENCOUNTER — Encounter (INDEPENDENT_AMBULATORY_CARE_PROVIDER_SITE_OTHER): Payer: Self-pay

## 2023-12-29 NOTE — Telephone Encounter (Signed)
 Called patient to schedule an appointment.  I was unable to leave a voicemail message because the mailbox was full.  I sent a MyChart message requesting a call back.

## 2024-01-01 ENCOUNTER — Encounter (INDEPENDENT_AMBULATORY_CARE_PROVIDER_SITE_OTHER): Payer: Self-pay | Admitting: Otolaryngology

## 2024-01-01 ENCOUNTER — Ambulatory Visit (INDEPENDENT_AMBULATORY_CARE_PROVIDER_SITE_OTHER): Admitting: Otolaryngology

## 2024-01-01 VITALS — BP 126/79 | HR 70 | Ht 63.0 in | Wt 167.0 lb

## 2024-01-01 DIAGNOSIS — H95122 Granulation of postmastoidectomy cavity, left ear: Secondary | ICD-10-CM | POA: Diagnosis not present

## 2024-01-01 DIAGNOSIS — H9312 Tinnitus, left ear: Secondary | ICD-10-CM

## 2024-01-01 DIAGNOSIS — H9202 Otalgia, left ear: Secondary | ICD-10-CM

## 2024-01-01 MED ORDER — CIPROFLOXACIN-DEXAMETHASONE 0.3-0.1 % OT SUSP
4.0000 [drp] | Freq: Two times a day (BID) | OTIC | 8 refills | Status: AC
Start: 2024-01-01 — End: 2024-01-08

## 2024-01-03 DIAGNOSIS — H9312 Tinnitus, left ear: Secondary | ICD-10-CM | POA: Insufficient documentation

## 2024-01-03 DIAGNOSIS — H95122 Granulation of postmastoidectomy cavity, left ear: Secondary | ICD-10-CM | POA: Insufficient documentation

## 2024-01-03 NOTE — Progress Notes (Signed)
 CC: Left ear pain, left ear tinnitus  HPI:  [name] is a 78 y.o. female who presents today complaining of left ear pain and left ear tinnitus for the past 6 weeks.  The patient has a history of traumatic left tympanic membrane perforation and chronic left ear infections.  She underwent multiple left ear surgeries by Dr. Sean Czar and 2 other otologists at [facility] and at [facility].  The patient denies any recent otorrhea, vertigo, or change in her hearing.  Past Medical History:  Diagnosis Date   Abdominal pain, left lower quadrant 08/14/2009   Qualifier: Diagnosis of  By: Tonita Frater, Pam     Abnormal CT scan, stomach    Abnormal LFTs 06/19/2017   AICD (automatic cardioverter/defibrillator) present    Dr.Allred follows   Aortic arch pseudoaneurysm (HCC)    a. followed by Dr. Matt Song.   Arthritis    Asthma    Benign neoplasm of colon    CAD (coronary artery disease) 02/2009   a. anterior STEMI rx with BMS to prox LAD in 02/2009. b. ISR s/p PTCA 06/2009. c. ISR s/p thrombectomy & PTCA 03/2010 due to late stent thrombosis. // Myoview  04/2019: EF 28, ant, ant-sept, inf-sept scar, no ischemia, high risk (stable>>cont med Rx)    Cardiomyopathy, ischemic 06/12/2011   Chronic renal insufficiency    stage 3   Chronic systolic CHF (congestive heart failure) (HCC)    a. s/p ST. Jude ICD 2012-01-18.   Chronic systolic heart failure (HCC) 08/05/2011   Colon polyp    Complication of anesthesia    COPD (chronic obstructive pulmonary disease) (HCC)    CORONARY ARTERY DISEASE 04/24/2009   Qualifier: Diagnosis of  By: Shelva Dice, Amy S    DDD (degenerative disc disease), lumbar 05/16/2011   Depression    "since my son died in 11-18-2023" 2018/01/17)   Diverticulosis    DIVERTICULOSIS-COLON 08/14/2009   Qualifier: Diagnosis of  By: Francisca Irvine PA-c, Amy S    DYSPHAGIA UNSPECIFIED 04/24/2009   Qualifier: Diagnosis of  By: Tonita Frater, Pam     Dyspnea    "when I lay down at night"     Essential hypertension 09/05/2009   Qualifier: Diagnosis of  By: Jens Molder CMA, Jennifer     Gastric polyp    GERD 10/19/2007   Qualifier: Diagnosis of  By: Margaretmary Shaver RN, Victor Grapes: Diagnosis of  By: Tonita Frater, Pam     GERD (gastroesophageal reflux disease)    GI bleed    a. h/o GIB on DAPT, now on ASA only.   Headache 01/03/2013   Hearing loss    left ear   History of pulmonary embolus (PE) 01/02/2011   Hyperlipemia    Hyperlipidemia, unspecified 04/24/2009   Qualifier: Diagnosis of  By: Francisca Irvine PA-c, Amy S    Hypertension    ICD-St.Jude 08/06/2011   S/p St. Jude ICD placement 08/05/11    Insomnia    Irritable bowel syndrome    Ischemic cardiomyopathy    EF 10-15%   Memory loss    Migraine headache without aura    Myocardial infarct (HCC) 2011 x 2   Dr. Cooper,cardiology    PERSONAL HX COLONIC POLYPS 04/24/2009   Qualifier: Diagnosis of  By: Shelva Dice, Amy S November, 2011 colonoscopy demonstrated a sessile cecal polyp and sigmoid polyp    Pneumonia    PONV (postoperative nausea and vomiting)    Pre-diabetes    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  Pulmonary embolism (HCC) 2011   Stroke [facility])    'When I was young." no residual   Thoracic aortic aneurysm (HCC) 03/31/2018   TOBACCO ABUSE 04/04/2009   Qualifier: Diagnosis of  By: Lavonia Powers  Qualifier: Diagnosis of  By: Judy Null, MD, Daralyn Earl    Transfusion history    ?'12 or '13   UNSPECIFIED ANEMIA 07/03/2010   Qualifier: Diagnosis of  By: Ramona Burner     Unspecified mastoiditis     Past Surgical History:  Procedure Laterality Date   ANGIOPLASTY  07/02/09, 04/01/10   BILATERAL SALPINGOOPHORECTOMY     CAD( bare metal stent)  02/2009   x 1   CAROTID-SUBCLAVIAN BYPASS GRAFT Left 03/31/2018   Procedure: LEFT SUBCLAVIAN ARTERY BYPASS GRAFT;  Surgeon: Margherita Shell, MD;  Location: [facility] OR;  Service: Vascular;  Laterality: Left;   CERVICAL SPINE SURGERY  08/08   COLONOSCOPY WITH PROPOFOL   N/A 04/29/2016   Procedure: COLONOSCOPY WITH PROPOFOL ;  Surgeon: Danette Duos, MD;  Location: [facility] ENDOSCOPY;  Service: Gastroenterology;  Laterality: N/A;   ESOPHAGOGASTRODUODENOSCOPY N/A 12/09/2016   Procedure: ESOPHAGOGASTRODUODENOSCOPY (EGD);  Surgeon: Danette Duos, MD;  Location: [facility];  Service: Gastroenterology;  Laterality: N/A;   ESOPHAGOGASTRODUODENOSCOPY (EGD) WITH PROPOFOL  N/A 04/29/2016   Procedure: ESOPHAGOGASTRODUODENOSCOPY (EGD) WITH PROPOFOL ;  Surgeon: Danette Duos, MD;  Location: [facility] ENDOSCOPY;  Service: Gastroenterology;  Laterality: N/A;   EUS N/A 05/15/2016   Procedure: UPPER ENDOSCOPIC ULTRASOUND (EUS) RADIAL;  Surgeon: Janel Medford, MD;  Location: [facility] ENDOSCOPY;  Service: Endoscopy;  Laterality: N/A;   ICD GENERATOR CHANGEOUT N/A 11/04/2022   Procedure: ICD GENERATOR CHANGEOUT;  Surgeon: Arlester Ladd, Donnamae Gaba, MD;  Location: [facility] INVASIVE CV LAB;  Service: Cardiovascular;  Laterality: N/A;   IMPLANTABLE CARDIOVERTER DEFIBRILLATOR IMPLANT N/A 08/05/2011   Primary prevention SJM ICD implanted,  Analyze ST study patient   INNER EAR SURGERY     left x 17   LUMBAR DISC SURGERY  02/2008   fusion   NASAL SEPTUM SURGERY     RIGHT HEART CATH N/A 12/11/2021   Procedure: RIGHT HEART CATH;  Surgeon: Arnoldo Lapping, MD;  Location: [facility] INVASIVE CV LAB;  Service: Cardiovascular;  Laterality: N/A;   THORACIC AORTIC ENDOVASCULAR STENT GRAFT N/A 03/31/2018   Procedure: THORACIC AORTIC ENDOVASCULAR STENT GRAFT;  Surgeon: Margherita Shell, MD;  Location: [facility] OR;  Service: Vascular;  Laterality: N/A;   TOTAL ABDOMINAL HYSTERECTOMY     complete    Family History  Problem Relation Age of Onset   Colon cancer Mother    Other Brother        tumor in his brain   Bladder Cancer Brother    Throat cancer Brother    Cancer Son        kidney, spread to his liver   Breast cancer Cousin     Social History:  reports that she has quit smoking. Her smoking use included  cigarettes. She has a 3.9 pack-year smoking history. She has never used smokeless tobacco. She reports that she does not drink alcohol and does not use drugs.  Allergies:  Allergies  Allergen Reactions   Sulfa Antibiotics Other (See Comments)    Unknown, childhood allergy    Sulfonamide Derivatives Other (See Comments)    UNSURE   Zestril  [Lisinopril ] Cough    Prior to Admission medications   Medication Sig Start Date End Date Taking? Authorizing Provider  acetaminophen  (TYLENOL ) 500 MG tablet Take 2 tablets (1,000 mg  total) by mouth every 6 (six) hours as needed (back pain.). 08/11/22  Yes Redwine, Madison A, PA-C  amitriptyline  (ELAVIL ) 50 MG tablet Take 1 tablet (50 mg total) by mouth at bedtime. 08/31/23  Yes SagardiaIsidro Margo, MD  aspirin  81 MG EC tablet Take 81 mg by mouth at bedtime.   Yes [provider]  bisoprolol  (ZEBETA ) 5 MG tablet TAKE 1 TABLET BY MOUTH DAILY. 07/07/23  Yes Arnoldo Lapping, MD  ciprofloxacin -dexamethasone  (CIPRODEX ) OTIC suspension Place 4 drops into the left ear 2 (two) times daily for 7 days. 01/01/24 01/08/24 Yes Reynold Caves, MD  dexlansoprazole  (DEXILANT ) 60 MG capsule TAKE 1 CAPSULE (60 MG TOTAL) BY MOUTH DAILY. **PLEASE CALL OFFICE TO SCHEDULE FOLLOW UP 08/05/23  Yes Esterwood, Amy S, PA-C  diclofenac  Sodium (VOLTAREN  ARTHRITIS PAIN) 1 % GEL Apply 2 g topically 2 (two) times daily as needed (apply to low back  and/or hips for pain). 09/21/23  Yes Cherri Corns C, DO  furosemide  (LASIX ) 40 MG tablet Take 1 tablet (40 mg total) by mouth daily. Patient taking differently: Take 40 mg by mouth at bedtime. 12/09/21  Yes Weaver, Scott T, PA-C  polyethylene glycol powder (GLYCOLAX /MIRALAX ) powder Take 17 g by mouth daily as needed for mild constipation. 07/08/17  Yes Elyce Hams, Marguerita Shih, MD  PREMARIN  vaginal cream Place 0.5 g vaginally daily as needed (irritation.). 10/03/22  Yes [provider]  rosuvastatin  (CRESTOR ) 20 MG tablet TAKE 1 TABLET (20 MG  TOTAL) BY MOUTH AT BEDTIME. 07/07/23  Yes Arnoldo Lapping, MD  STIOLTO RESPIMAT  2.5-2.5 MCG/ACT AERS INHALE 2 PUFFS INTO THE LUNGS DAILY. NEED APPOINTMENT FOR FURTHER REFILLS 02/13/23  Yes Hunsucker, Archer Kobs, MD  topiramate  (TOPAMAX ) 50 MG tablet TAKE 1 TABLET (50 MG TOTAL) BY MOUTH DAILY. 11/17/23  Yes Sagardia, Isidro Margo, MD  hydrALAZINE  (APRESOLINE ) 10 MG tablet TAKE 1 TABLET (10 MG TOTAL) BY MOUTH 3 (THREE) TIMES DAILY. 09/12/22 09/12/23  Marlyse Single T, PA-C    Blood pressure 126/79, pulse 70, height 5\' 3"  (1.6 m), weight 167 lb (75.8 kg), SpO2 93%. Exam: General: Communicates without difficulty, well nourished, no acute distress. Head: Normocephalic, no evidence injury, no tenderness, facial buttresses intact without stepoff. Face/sinus: No tenderness to palpation and percussion. Facial movement is normal and symmetric. Eyes: PERRL, EOMI. No scleral icterus, conjunctivae clear. Neuro: CN II exam reveals vision grossly intact.  No nystagmus at any point of gaze. Ears: Auricles well formed without lesions.  The right ear canal and tympanic membrane are normal.  Squamous debris and granulation tissue are noted within the left mastoid cavity.  Nose: External evaluation reveals normal support and skin without lesions.  Dorsum is intact.  Anterior rhinoscopy reveals congested mucosa over anterior aspect of inferior turbinates and intact septum.  No purulence noted. Oral:  Oral cavity and oropharynx are intact, symmetric, without erythema or edema.  Mucosa is moist without lesions. Neck: Full range of motion without pain.  There is no significant lymphadenopathy.  No masses palpable.  Thyroid  bed within normal limits to palpation.  Parotid glands and submandibular glands equal bilaterally without mass.  Trachea is midline. Neuro:  CN 2-12 grossly intact.   Procedure: Debridement of the left mastoid cavity Anesthesia: None Description: The patient is placed supine on the exam  table. Under the operating  microscope, the left ear is examined. A large mastoid bowl is noted. A significant amount of squamous debris and granulation tissue are noted within the mastoid bowl. Under the operating microscope,  the squamous debris and granulation tissue are extensively and carefully removed with a combination of suction catheters, cerumen curette, and alligator forceps. After the debridement procedure, the mastoid bowl is noted to be inflamed.  The patient tolerated the procedure well.   Assessment: 1.  Chronic left mastoiditis with squamous debris and granulation tissue within the left mastoid bowl. 2.  Subjective left ear tinnitus.  Plan: 1.  The physical exam findings are reviewed with the patient. 2.  Otomicroscopy with debridement of the left mastoid cavity. 3.  Ciprodex  eardrops 4 drops left ear twice daily for 7 days. 4.  The patient will return for reevaluation if she continues to be symptomatic.  Kyesha Balla W Alexzia Kasler 01/03/2024, 6:16 PM

## 2024-01-18 ENCOUNTER — Other Ambulatory Visit: Payer: Self-pay | Admitting: Cardiovascular Disease

## 2024-01-18 ENCOUNTER — Other Ambulatory Visit: Payer: Self-pay | Admitting: Physician Assistant

## 2024-02-01 ENCOUNTER — Encounter: Payer: Self-pay | Admitting: Emergency Medicine

## 2024-02-04 ENCOUNTER — Ambulatory Visit: Payer: 59

## 2024-02-04 DIAGNOSIS — I255 Ischemic cardiomyopathy: Secondary | ICD-10-CM | POA: Diagnosis not present

## 2024-02-05 LAB — CUP PACEART REMOTE DEVICE CHECK
Battery Remaining Longevity: 109 mo
Battery Remaining Percentage: 88 %
Battery Voltage: 3.01 V
Brady Statistic RV Percent Paced: 1 %
Date Time Interrogation Session: 20250710211431
HighPow Impedance: 92 Ohm
Implantable Lead Connection Status: 753985
Implantable Lead Implant Date: 20130108
Implantable Lead Location: 753860
Implantable Pulse Generator Implant Date: 20240409
Lead Channel Impedance Value: 500 Ohm
Lead Channel Pacing Threshold Amplitude: 0.75 V
Lead Channel Pacing Threshold Pulse Width: 0.5 ms
Lead Channel Sensing Intrinsic Amplitude: 12 mV
Lead Channel Setting Pacing Amplitude: 2.5 V
Lead Channel Setting Pacing Pulse Width: 0.5 ms
Lead Channel Setting Sensing Sensitivity: 0.5 mV
Pulse Gen Serial Number: 211016490
Zone Setting Status: 755011

## 2024-02-10 ENCOUNTER — Ambulatory Visit: Payer: Self-pay | Admitting: Cardiology

## 2024-02-15 ENCOUNTER — Other Ambulatory Visit: Payer: Self-pay | Admitting: Cardiovascular Disease

## 2024-02-15 ENCOUNTER — Other Ambulatory Visit: Payer: Self-pay | Admitting: Emergency Medicine

## 2024-02-15 ENCOUNTER — Other Ambulatory Visit: Payer: Self-pay | Admitting: Physician Assistant

## 2024-02-15 DIAGNOSIS — G43009 Migraine without aura, not intractable, without status migrainosus: Secondary | ICD-10-CM

## 2024-02-23 ENCOUNTER — Ambulatory Visit (INDEPENDENT_AMBULATORY_CARE_PROVIDER_SITE_OTHER): Payer: 59

## 2024-02-23 VITALS — Ht 62.0 in | Wt 167.0 lb

## 2024-02-23 DIAGNOSIS — Z0000 Encounter for general adult medical examination without abnormal findings: Secondary | ICD-10-CM | POA: Diagnosis not present

## 2024-02-23 NOTE — Progress Notes (Signed)
 Subjective:   [name] is a 78 y.o. who presents for a Medicare Wellness preventive visit.  As a reminder, Annual Wellness Visits don't include a physical exam, and some assessments may be limited, especially if this visit is performed virtually. We may recommend an in-person follow-up visit with your provider if needed.  Visit Complete: Virtual I connected with  [name] on 02/23/24 by a audio enabled telemedicine application and verified that I am speaking with the correct person using two identifiers.  Patient Location: Home  Provider Location: Home Office  I discussed the limitations of evaluation and management by telemedicine. The patient expressed understanding and agreed to proceed.  Vital Signs: Because this visit was a virtual/telehealth visit, some criteria may be missing or patient reported. Any vitals not documented were not able to be obtained and vitals that have been documented are patient reported.  VideoDeclined- This patient declined Librarian, academic. Therefore the visit was completed with audio only.  Persons Participating in Visit: Patient.  AWV Questionnaire: No: Patient Medicare AWV questionnaire was not completed prior to this visit.  Cardiac Risk Factors include: advanced age (>74men, >59 women);hypertension;Other (see comment);dyslipidemia, Risk factor comments: COPD, HFrEF, Stage 4 CKD     Objective:    Today's Vitals   02/23/24 1108  Weight: 167 lb (75.8 kg)  Height: 5' 2 (1.575 m)   Body mass index is 30.54 kg/m.     02/23/2024   11:32 AM 06/15/2023    2:29 PM 02/17/2023   11:27 AM 11/04/2022    8:44 AM 12/13/2021    1:41 PM 12/11/2021   10:57 AM 04/04/2021   11:57 AM  Advanced Directives  Does Patient Have a Medical Advance Directive? No Yes Yes No No No No  Type of Best boy of [place];Living will      Does patient want to make changes to medical advance directive?  No -  Patient declined       Copy of Healthcare Power of Attorney in Chart?   No - copy requested      Would patient like information on creating a medical advance directive?    No - Patient declined No - Patient declined No - Patient declined No - Patient declined    Current Medications (verified) Outpatient Encounter Medications as of 02/23/2024  Medication Sig   acetaminophen  (TYLENOL ) 500 MG tablet Take 2 tablets (1,000 mg total) by mouth every 6 (six) hours as needed (back pain.).   amitriptyline  (ELAVIL ) 50 MG tablet TAKE 1 TABLET (50 MG TOTAL) BY MOUTH AT BEDTIME.   aspirin  81 MG EC tablet Take 81 mg by mouth at bedtime.   bisoprolol  (ZEBETA ) 5 MG tablet TAKE 1 TABLET BY MOUTH DAILY. SCHEDULE OFFICE VISIT FOR FURTHER REFILLS [phone]   dexlansoprazole  (DEXILANT ) 60 MG capsule TAKE 1 CAPSULE (60 MG TOTAL) BY MOUTH DAILY. **PLEASE CALL OFFICE TO SCHEDULE FOLLOW UP   diclofenac  Sodium (VOLTAREN  ARTHRITIS PAIN) 1 % GEL Apply 2 g topically 2 (two) times daily as needed (apply to low back  and/or hips for pain).   furosemide  (LASIX ) 40 MG tablet Take 1 tablet (40 mg total) by mouth daily. (Patient taking differently: Take 40 mg by mouth at bedtime.)   hydrALAZINE  (APRESOLINE ) 10 MG tablet TAKE 1 TABLET BY MOUTH 3 TIMES DAILY. SCHEDULE OFFICE VISIT FOR FURTHER REFILLS [phone]   polyethylene glycol powder (GLYCOLAX /MIRALAX ) powder Take 17 g by mouth daily as needed for mild constipation.  PREMARIN  vaginal cream Place 0.5 g vaginally daily as needed (irritation.).   rosuvastatin  (CRESTOR ) 20 MG tablet TAKE 1 TABLET (20 MG TOTAL) BY MOUTH AT BEDTIME. SCHEDULE OFFICE VISIT FOR FUTHER REFILLS [phone]   STIOLTO RESPIMAT  2.5-2.5 MCG/ACT AERS INHALE 2 PUFFS INTO THE LUNGS DAILY. NEED APPOINTMENT FOR FURTHER REFILLS   topiramate  (TOPAMAX ) 50 MG tablet TAKE 1 TABLET (50 MG TOTAL) BY MOUTH DAILY.   traMADol  (ULTRAM ) 50 MG tablet Take 50 mg by mouth daily as needed.   No facility-administered  encounter medications on file as of 02/23/2024.    Allergies (verified) Sulfa antibiotics, Sulfonamide derivatives, and Zestril  Fennec.Footman ]   History: Past Medical History:  Diagnosis Date   Abdominal pain, left lower quadrant 08/14/2009   Qualifier: Diagnosis of  By: Drucilla BANANA, Pam     Abnormal CT scan, stomach    Abnormal LFTs 06/19/2017   AICD (automatic cardioverter/defibrillator) present    Dr.Allred follows   Aortic arch pseudoaneurysm (HCC)    a. followed by Dr. Fleeta Ochoa.   Arthritis    Asthma    Benign neoplasm of colon    CAD (coronary artery disease) 24-Mar-2009   a. anterior STEMI rx with BMS to prox LAD in 24-Mar-2009. b. ISR s/p PTCA 06/2009. c. ISR s/p thrombectomy & PTCA 03/2010 due to late stent thrombosis. // Myoview  04/2019: EF 28, ant, ant-sept, inf-sept scar, no ischemia, high risk (stable>>cont med Rx)    Cardiomyopathy, ischemic 06/12/2011   Chronic renal insufficiency    stage 3   Chronic systolic CHF (congestive heart failure) (HCC)    a. s/p ST. Jude ICD 24-Mar-2012.   Chronic systolic heart failure (HCC) 08/05/2011   Colon polyp    Complication of anesthesia    COPD (chronic obstructive pulmonary disease) (HCC)    CORONARY ARTERY DISEASE 04/24/2009   Qualifier: Diagnosis of  By: Ever Riggers, Amy S    DDD (degenerative disc disease), lumbar 05/16/2011   Depression    since my son died in November 23, 2023 March 24, 2018)   Diverticulosis    DIVERTICULOSIS-COLON 08/14/2009   Qualifier: Diagnosis of  By: Ever PA-c, Amy S    DYSPHAGIA UNSPECIFIED 04/24/2009   Qualifier: Diagnosis of  By: Drucilla BANANA, Pam     Dyspnea    when I lay down at night    Essential hypertension 09/05/2009   Qualifier: Diagnosis of  By: Thalia CMA, Jennifer     Gastric polyp    GERD 10/19/2007   Qualifier: Diagnosis of  By: Kimble RN, [place]: Diagnosis of  By: Drucilla BANANA, Pam     GERD (gastroesophageal reflux disease)    GI bleed    a. h/o GIB on DAPT, now on ASA only.   Headache  01/03/2013   Hearing loss    left ear   History of pulmonary embolus (PE) 01/02/2011   Hyperlipemia    Hyperlipidemia, unspecified 04/24/2009   Qualifier: Diagnosis of  By: Ever PA-c, Amy S    Hypertension    ICD-St.Jude 08/06/2011   S/p St. Jude ICD placement 08/05/11    Insomnia    Irritable bowel syndrome    Ischemic cardiomyopathy    EF 10-15%   Memory loss    Migraine headache without aura    Myocardial infarct (HCC) 2011 x 2   Dr. Cooper,cardiology    PERSONAL HX COLONIC POLYPS 04/24/2009   Qualifier: Diagnosis of  By: Ever Riggers, Amy S November, 2011 colonoscopy demonstrated a sessile cecal polyp and sigmoid polyp  Pneumonia    PONV (postoperative nausea and vomiting)    Pre-diabetes    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   Pulmonary embolism (HCC) 2011   Stroke [facility])    'When I was young. no residual   Thoracic aortic aneurysm (HCC) 03/31/2018   TOBACCO ABUSE 04/04/2009   Qualifier: Diagnosis of  By: Parthenia DEVONNA Olivia Mason  Qualifier: Diagnosis of  By: Morris, MD, CODY Debby Lenis    Transfusion history    ?'12 or '13   UNSPECIFIED ANEMIA 07/03/2010   Qualifier: Diagnosis of  By: Arlyss Math     Unspecified mastoiditis    Past Surgical History:  Procedure Laterality Date   ANGIOPLASTY  07/02/09, 04/01/10   BILATERAL SALPINGOOPHORECTOMY     CAD( bare metal stent)  02/2009   x 1   CAROTID-SUBCLAVIAN BYPASS GRAFT Left 03/31/2018   Procedure: LEFT SUBCLAVIAN ARTERY BYPASS GRAFT;  Surgeon: Serene Gaile ORN, MD;  [facility];  Service: Vascular;  Laterality: Left;   CERVICAL SPINE SURGERY  08/08   COLONOSCOPY WITH PROPOFOL  N/A 04/29/2016   Procedure: COLONOSCOPY WITH PROPOFOL ;  Surgeon: Elspeth Deward Naval, MD;  Location: [facility] ENDOSCOPY;  Service: Gastroenterology;  Laterality: N/A;   ESOPHAGOGASTRODUODENOSCOPY N/A 12/09/2016   Procedure: ESOPHAGOGASTRODUODENOSCOPY (EGD);  Surgeon: Naval Elspeth Deward, MD;  Location: [facility];  Service:  Gastroenterology;  Laterality: N/A;   ESOPHAGOGASTRODUODENOSCOPY (EGD) WITH PROPOFOL  N/A 04/29/2016   Procedure: ESOPHAGOGASTRODUODENOSCOPY (EGD) WITH PROPOFOL ;  Surgeon: Elspeth Deward Naval, MD;  Location: [facility];  Service: Gastroenterology;  Laterality: N/A;   EUS N/A 05/15/2016   Procedure: UPPER ENDOSCOPIC ULTRASOUND (EUS) RADIAL;  Surgeon: Toribio SHAUNNA Cedar, MD;  Location: [facility];  Service: Endoscopy;  Laterality: N/A;   ICD GENERATOR CHANGEOUT N/A 11/04/2022   Procedure: ICD GENERATOR CHANGEOUT;  Surgeon: Nancey, Eulas BRAVO, MD;  Location: [facility] INVASIVE CV LAB;  Service: Cardiovascular;  Laterality: N/A;   IMPLANTABLE CARDIOVERTER DEFIBRILLATOR IMPLANT N/A 08/05/2011   Primary prevention SJM ICD implanted,  Analyze ST study patient   INNER EAR SURGERY     left x 17   LUMBAR DISC SURGERY  02/2008   fusion   NASAL SEPTUM SURGERY     RIGHT HEART CATH N/A 12/11/2021   Procedure: RIGHT HEART CATH;  Surgeon: Wonda Sharper, MD;  Location: [facility] INVASIVE CV LAB;  Service: Cardiovascular;  Laterality: N/A;   THORACIC AORTIC ENDOVASCULAR STENT GRAFT N/A 03/31/2018   Procedure: THORACIC AORTIC ENDOVASCULAR STENT GRAFT;  Surgeon: Serene Gaile ORN, MD;  Location: [facility] OR;  Service: Vascular;  Laterality: N/A;   TOTAL ABDOMINAL HYSTERECTOMY     complete   Family History  Problem Relation Age of Onset   Colon cancer Mother    Other Brother        tumor in his brain   Bladder Cancer Brother    Throat cancer Brother    Cancer Son        kidney, spread to his liver   Breast cancer Cousin    Social History   Socioeconomic History   Marital status: Divorced    Spouse name: Not on file   Number of children: 2   Years of education: 11   Highest education level: 12th grade  Occupational History   Occupation: Designer, industrial/product: UNEMPLOYED    Employer: DISABLED  Tobacco Use   Smoking status: Former    Current packs/day: 0.13    Average packs/day: 0.1 packs/day for 30.0 years (3.9 ttl  pk-yrs)  Types: Cigarettes   Smokeless tobacco: Never  Vaping Use   Vaping status: Never Used  Substance and Sexual Activity   Alcohol use: No    Alcohol/week: 0.0 standard drinks of alcohol   Drug use: No   Sexual activity: Not Currently  Other Topics Concern   Not on file  Social History Narrative   Pt lives in [place] alone. 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).Patient has 11 th grade education.Right handed.Caffeine - one cup daily   Social Drivers of Health   Financial Resource Strain: Low Risk  (02/23/2024)   Overall Financial Resource Strain (CARDIA)    Difficulty of Paying Living Expenses: Not hard at all  Food Insecurity: No Food Insecurity (02/23/2024)   Hunger Vital Sign    Worried About Running Out of Food in the Last Year: Never true    Ran Out of Food in the Last Year: Never true  Transportation Needs: No Transportation Needs (02/23/2024)   PRAPARE - Administrator, Civil Service (Medical): No    Lack of Transportation (Non-Medical): No  Physical Activity: Inactive (02/23/2024)   Exercise Vital Sign    Days of Exercise per Week: 0 days    Minutes of Exercise per Session: 0 min  Stress: No Stress Concern Present (02/23/2024)   [employer] of Occupational Health - Occupational Stress Questionnaire    Feeling of Stress: Not at all  Social Connections: Moderately Integrated (02/23/2024)   Social Connection and Isolation Panel    Frequency of Communication with Friends and Family: Once a week    Frequency of Social Gatherings with Friends and Family: More than three times a week    Attends Religious Services: More than 4 times per year    Active Member of [employer] or Organizations: Yes    Attends Banker Meetings: Never    Marital Status: Divorced    Tobacco Counseling Counseling given: Not Answered    Clinical Intake:  Pre-visit preparation completed: Yes  Pain : No/denies pain     BMI - recorded:  30.54 Nutritional Status: BMI > 30  Obese Nutritional Risks: Nausea/ vomitting/ diarrhea (diarrhea) Diabetes: No  Lab Results  Component Value Date   HGBA1C 6.6 (H) 08/31/2023   HGBA1C 6.0 (H) 03/12/2020   HGBA1C 6.1 (H) 09/12/2019     How often do you need to have someone help you when you read instructions, pamphlets, or other written materials from your doctor or pharmacy?: 4 - Often (her son helps her)  Interpreter Needed?: No  Information entered by :: Damarion Mendizabal, RMA   Activities of Daily Living     02/23/2024   11:09 AM  In your present state of health, do you have any difficulty performing the following activities:  Hearing? 1  Comment has hearing aides but does not wear them  Vision? 0  Difficulty concentrating or making decisions? 0  Walking or climbing stairs? 0  Dressing or bathing? 0  Doing errands, shopping? 0  Preparing Food and eating ? N  Using the Toilet? N  In the past six months, have you accidently leaked urine? Y  Comment wears pads  Do you have problems with loss of bowel control? Y  Comment wears pads  Managing your Medications? N  Managing your Finances? N  Housekeeping or managing your Housekeeping? N    Patient Care Team: Purcell Emil Schanz, MD as PCP - General (Internal Medicine) Wonda Sharper, MD as PCP - Cardiology (Cardiology) Mealor, Eulas  E, MD as PCP - Electrophysiology (Cardiology) Obadiah Coy, MD as Attending Physician (Cardiothoracic Surgery) Onetha Kuba, MD (Neurosurgery) Ethyl Lonni BRAVO, MD (Inactive) (Otolaryngology) Love, Lynwood HERO, MD (Neurology) Kelsie Lynwood, MD (Inactive) as Consulting Physician (Cardiology) Armbruster, Elspeth SQUIBB, MD as Consulting Physician (Gastroenterology)  I have updated your Care Teams any recent Medical Services you may have received from other providers in the past year.     Assessment:   This is a routine wellness examination for [place].  Hearing/Vision screen Hearing  Screening - Comments:: has hearing aides but does not wear them Vision Screening - Comments:: Denies vision issues.    Goals Addressed   None    Depression Screen     02/23/2024   11:38 AM 12/23/2023    1:56 PM 08/31/2023    1:24 PM 08/10/2023    1:57 PM 07/15/2023    1:53 PM 06/02/2023    2:28 PM 05/20/2023   11:26 AM  PHQ 2/9 Scores  PHQ - 2 Score 0 3 0 0 0 0 0  PHQ- 9 Score 3 8     6     Fall Risk     02/23/2024   11:33 AM 12/23/2023    1:56 PM 08/31/2023    1:24 PM 08/10/2023    1:57 PM 07/15/2023    1:53 PM  Fall Risk   Falls in the past year? 0 0 1 1 0  Number falls in past yr: 0 0 0 0 0  Injury with Fall? 0 0 0 0 0  Risk for fall due to :  No Fall Risks No Fall Risks    Follow up Falls evaluation completed;Falls prevention discussed Falls evaluation completed       MEDICARE RISK AT HOME:  Medicare Risk at Home Any stairs in or around the home?: No If so, are there any without handrails?: No Home free of loose throw rugs in walkways, pet beds, electrical cords, etc?: Yes Adequate lighting in your home to reduce risk of falls?: Yes Life alert?: No Use of a cane, walker or w/c?: No Grab bars in the bathroom?: Yes Shower chair or bench in shower?: No Elevated toilet seat or a handicapped toilet?: No  TIMED UP AND GO:  Was the test performed?  No  Cognitive Function: 6CIT completed        02/23/2024   11:33 AM 02/17/2023   11:32 AM 02/08/2020   10:04 AM 01/31/2019    3:26 PM  6CIT Screen  What Year? 0 points 0 points 0 points 0 points  What month? 0 points 0 points 0 points 0 points  What time? 0 points 0 points 0 points 0 points  Count back from 20 0 points 0 points 0 points 0 points  Months in reverse 4 points 4 points 0 points 4 points  Repeat phrase 8 points 4 points 10 points 0 points  Total Score 12 points 8 points 10 points 4 points    Immunizations Immunization History  Administered Date(s) Administered   Fluad Quad(high Dose 65+) 04/03/2016,  04/21/2017, 04/06/2020, 03/27/2021, 04/27/2022, 04/16/2023   Influenza Nasal 04/27/2017   Influenza Split 08/06/2011   Influenza Whole 04/27/2009   Influenza, High Dose Seasonal PF 04/29/2013, 06/09/2014, 05/12/2015, 04/03/2016, 04/21/2017, 04/17/2018, 04/25/2018, 04/18/2019   Influenza-Unspecified 04/15/2012   Moderna Sars-Covid-2 Vaccination 09/09/2019   Pneumococcal Conjugate-13 06/14/2014, 06/16/2018   Pneumococcal Polysaccharide-23 04/27/2009, 08/06/2011, 04/15/2012, 04/03/2016   Unspecified SARS-COV-2 Vaccination 04/16/2023   Zoster, Live 05/03/2013    Screening Tests Health  Maintenance  Topic Date Due   Zoster Vaccines- Shingrix (1 of 2) 08[birth year]07/1966   DEXA SCAN  Never done   COVID-19 Vaccine (3 - Mixed Product risk series) 05/14/2023   INFLUENZA VACCINE  02/26/2024   Medicare Annual Wellness (AWV)  02/22/2025   Pneumococcal Vaccine: 50+ Years  Completed   Hepatitis C Screening  Completed   Hepatitis B Vaccines  Aged Out   HPV VACCINES  Aged Out   Meningococcal B Vaccine  Aged Out   DTaP/Tdap/Td  Discontinued   Colonoscopy  Discontinued    Health Maintenance  Health Maintenance Due  Topic Date Due   Zoster Vaccines- Shingrix (1 of 2) 08/07/1966   DEXA SCAN  Never done   COVID-19 Vaccine (3 - Mixed Product risk series) 05/14/2023   Health Maintenance Items Addressed: See Nurse Notes at the end of this note  Additional Screening:  Vision Screening: Recommended annual ophthalmology exams for early detection of glaucoma and other disorders of the eye. Would you like a referral to an eye doctor? No    Dental Screening: Recommended annual dental exams for proper oral hygiene  Community Resource Referral / Chronic Care Management: CRR required this visit?  No   CCM required this visit?  No   Plan:    I have personally reviewed and noted the following in the patient's chart:   Medical and social history Use of alcohol, tobacco or illicit drugs  Current  medications and supplements including opioid prescriptions. Patient is currently taking opioid prescriptions. Information provided to patient regarding non-opioid alternatives. Patient advised to discuss non-opioid treatment plan with their provider. Functional ability and status Nutritional status Physical activity Advanced directives List of other physicians Hospitalizations, surgeries, and ER visits in previous 12 months Vitals Screenings to include cognitive, depression, and falls Referrals and appointments  In addition, I have reviewed and discussed with patient certain preventive protocols, quality metrics, and best practice recommendations. A written personalized care plan for preventive services as well as general preventive health recommendations were provided to patient.   Asja Frommer L Shade Rivenbark, CMA   02/23/2024   After Visit Summary: (MyChart) Due to this being a telephonic visit, the after visit summary with patients personalized plan was offered to patient via MyChart   Notes: Patient is due for a DEXA and a Shingrix vaccine, however she would like to discuss with PCP before order be placed.  Patient stated that she will call the office to schedule her 6 month follow up with PCP.  She had no other concerns to address today.

## 2024-02-23 NOTE — Patient Instructions (Addendum)
 [name] , Thank you for taking time out of your busy schedule to complete your Annual Wellness Visit with me. I enjoyed our conversation and look forward to speaking with you again next year. I, as well as your care team,  appreciate your ongoing commitment to your health goals. Please review the following plan we discussed and let me know if I can assist you in the future. Your Game plan/ To Do List    Follow up Visits: Next Medicare AWV with our clinical staff: Patient prefers a telephone visit.  02/23/2025.  Have you seen your provider in the last 6 months (3 months if uncontrolled diabetes)? Yes Next Office Visit with your provider: Patient stated that she will call to schedule her next visit.  Last visit on 12/23/2023.  Clinician Recommendations:  Aim for 30 minutes of exercise or brisk walking, 6-8 glasses of water, and 5 servings of fruits and vegetables each day. You are due for a bone density screening and also the new Shingles vaccine.  Remember to discuss both, screening and vaccine with Dr. Purcell during your next office visit.        This is a list of the screening recommended for you and due dates:  Health Maintenance  Topic Date Due   Zoster (Shingles) Vaccine (1 of 2) 08/07/1964   DEXA scan (bone density measurement)  Never done   COVID-19 Vaccine (3 - Mixed Product risk series) 05/14/2023   Flu Shot  02/26/2024   Medicare Annual Wellness Visit  02/22/2025   Pneumococcal Vaccine for age over 34  Completed   Hepatitis C Screening  Completed   Hepatitis B Vaccine  Aged Out   HPV Vaccine  Aged Out   Meningitis B Vaccine  Aged Out   DTaP/Tdap/Td vaccine  Discontinued   Colon Cancer Screening  Discontinued    Advanced directives: (Declined) Advance directive discussed with you today. Even though you declined this today, please call our office should you change your mind, and we can give you the proper paperwork for you to fill out. Advance Care Planning is important  because it:  [x]  Makes sure you receive the medical care that is consistent with your values, goals, and preferences  [x]  It provides guidance to your family and loved ones and reduces their decisional burden about whether or not they are making the right decisions based on your wishes.  Follow the link provided in your after visit summary or read over the paperwork we have mailed to you to help you started getting your Advance Directives in place. If you need assistance in completing these, please reach out to us  so that we can help you!  See attachments for Preventive Care and Fall Prevention Tips.   Managing Pain Without Opioids Opioids are strong medicines used to treat moderate to severe pain. For some people, especially those who have long-term (chronic) pain, opioids may not be the best choice for pain management due to: Side effects like nausea, constipation, and sleepiness. The risk of addiction (opioid use disorder). The longer you take opioids, the greater your risk of addiction. Pain that lasts for more than 3 months is called chronic pain. Managing chronic pain usually requires more than one approach and is often provided by a team of health care providers working together (multidisciplinary approach). Pain management may be done at a pain management center or pain clinic. How to manage pain without the use of opioids Use non-opioid medicines Non-opioid medicines for pain may include:  Over-the-counter or prescription non-steroidal anti-inflammatory drugs (NSAIDs). These may be the first medicines used for pain. They work well for muscle and bone pain, and they reduce swelling. Acetaminophen . This over-the-counter medicine may work well for milder pain but not swelling. Antidepressants. These may be used to treat chronic pain. A certain type of antidepressant (tricyclics) is often used. These medicines are given in lower doses for pain than when used for depression. Anticonvulsants.  These are usually used to treat seizures but may also reduce nerve (neuropathic) pain. Muscle relaxants. These relieve pain caused by sudden muscle tightening (spasms). You may also use a pain medicine that is applied to the skin as a patch, cream, or gel (topical analgesic), such as a numbing medicine. These may cause fewer side effects than medicines taken by mouth. Do certain therapies as directed Some therapies can help with pain management. They include: Physical therapy. You will do exercises to gain strength and flexibility. A physical therapist may teach you exercises to move and stretch parts of your body that are weak, stiff, or painful. You can learn these exercises at physical therapy visits and practice them at home. Physical therapy may also involve: Massage. Heat wraps or applying heat or cold to affected areas. Electrical signals that interrupt pain signals (transcutaneous electrical nerve stimulation, TENS). Weak lasers that reduce pain and swelling (low-level laser therapy). Signals from your body that help you learn to regulate pain (biofeedback). Occupational therapy. This helps you to learn ways to function at home and work with less pain. Recreational therapy. This involves trying new activities or hobbies, such as a physical activity or drawing. Mental health therapy, including: Cognitive behavioral therapy (CBT). This helps you learn coping skills for dealing with pain. Acceptance and commitment therapy (ACT) to change the way you think and react to pain. Relaxation therapies, including muscle relaxation exercises and mindfulness-based stress reduction. Pain management counseling. This may be individual, family, or group counseling.  Receive medical treatments Medical treatments for pain management include: Nerve block injections. These may include a pain blocker and anti-inflammatory medicines. You may have injections: Near the spine to relieve chronic back or neck  pain. Into joints to relieve back or joint pain. Into nerve areas that supply a painful area to relieve body pain. Into muscles (trigger point injections) to relieve some painful muscle conditions. A medical device placed near your spine to help block pain signals and relieve nerve pain or chronic back pain (spinal cord stimulation device). Acupuncture. Follow these instructions at home Medicines Take over-the-counter and prescription medicines only as told by your health care provider. If you are taking pain medicine, ask your health care providers about possible side effects to watch out for. Do not drive or use heavy machinery while taking prescription opioid pain medicine. Lifestyle  Do not use drugs or alcohol to reduce pain. If you drink alcohol, limit how much you have to: 0-1 drink a day for women who are not pregnant. 0-2 drinks a day for men. Know how much alcohol is in a drink. In the [place] one drink equals one 12 oz bottle of beer (355 mL), one 5 oz glass of wine (148 mL), or one 1 oz glass of hard liquor (44 mL). Do not use any products that contain nicotine or tobacco. These products include cigarettes, chewing tobacco, and vaping devices, such as e-cigarettes. If you need help quitting, ask your health care provider. Eat a healthy diet and maintain a healthy weight. Poor diet and excess weight  may make pain worse. Eat foods that are high in fiber. These include fresh fruits and vegetables, whole grains, and beans. Limit foods that are high in fat and processed sugars, such as fried and sweet foods. Exercise regularly. Exercise lowers stress and may help relieve pain. Ask your health care provider what activities and exercises are safe for you. If your health care provider approves, join an exercise class that combines movement and stress reduction. Examples include yoga and tai chi. Get enough sleep. Lack of sleep may make pain worse. Lower stress as much as possible.  Practice stress reduction techniques as told by your therapist. General instructions Work with all your pain management providers to find the treatments that work best for you. You are an important member of your pain management team. There are many things you can do to reduce pain on your own. Consider joining an online or in-person support group for people who have chronic pain. Keep all follow-up visits. This is important. Where to find more information You can find more information about managing pain without opioids from: American Academy of Pain Medicine: painmed.org Institute for Chronic Pain: instituteforchronicpain.org American Chronic Pain Association: theacpa.org Contact a health care provider if: You have side effects from pain medicine. Your pain gets worse or does not get better with treatments or home therapy. You are struggling with anxiety or depression. Summary Many types of pain can be managed without opioids. Chronic pain may respond better to pain management without opioids. Pain is best managed when you and a team of health care providers work together. Pain management without opioids may include non-opioid medicines, medical treatments, physical therapy, mental health therapy, and lifestyle changes. Tell your health care providers if your pain gets worse or is not being managed well enough. This information is not intended to replace advice given to you by your health care provider. Make sure you discuss any questions you have with your health care provider. Document Revised: 10/24/2020 Document Reviewed: 10/24/2020 Elsevier Patient Education  2024 [employer].

## 2024-03-09 ENCOUNTER — Telehealth: Payer: Self-pay | Admitting: Gastroenterology

## 2024-03-09 NOTE — Telephone Encounter (Signed)
Attempted to reach patient. No answer, voicemail full.

## 2024-03-09 NOTE — Telephone Encounter (Signed)
 Patient called and stated she is having abdominal pain for 3 days and is requesting to be seen this month with Dr. Leigh or his APP. Patient was advised that Dr. Leigh did not have anything until October and his APP did not have anything until October as well. Patient is requesting a call back. Please advise.

## 2024-03-09 NOTE — Telephone Encounter (Signed)
 Dr Leigh pt

## 2024-03-11 NOTE — Telephone Encounter (Signed)
 2nd attempt to reach patient. No answer, voicemail full.

## 2024-03-14 NOTE — Telephone Encounter (Signed)
 Following 2 attempts to reach patient regarding her concerns, we have been unable to speak with patient and voicemail remains full. We will await further correspondence from patient before continuing our efforts.

## 2024-03-15 ENCOUNTER — Other Ambulatory Visit: Payer: Self-pay | Admitting: Cardiovascular Disease

## 2024-03-16 ENCOUNTER — Encounter: Payer: Self-pay | Admitting: Physical Medicine and Rehabilitation

## 2024-03-16 ENCOUNTER — Encounter: Payer: 59 | Attending: Physical Medicine and Rehabilitation | Admitting: Physical Medicine and Rehabilitation

## 2024-03-16 VITALS — BP 137/83 | HR 83 | Ht 62.0 in | Wt 171.2 lb

## 2024-03-16 DIAGNOSIS — G894 Chronic pain syndrome: Secondary | ICD-10-CM | POA: Diagnosis present

## 2024-03-16 DIAGNOSIS — M542 Cervicalgia: Secondary | ICD-10-CM | POA: Insufficient documentation

## 2024-03-16 DIAGNOSIS — Z981 Arthrodesis status: Secondary | ICD-10-CM | POA: Diagnosis present

## 2024-03-16 DIAGNOSIS — Z79891 Long term (current) use of opiate analgesic: Secondary | ICD-10-CM | POA: Diagnosis present

## 2024-03-16 DIAGNOSIS — Z5181 Encounter for therapeutic drug level monitoring: Secondary | ICD-10-CM | POA: Diagnosis present

## 2024-03-16 DIAGNOSIS — M25551 Pain in right hip: Secondary | ICD-10-CM

## 2024-03-16 DIAGNOSIS — M7918 Myalgia, other site: Secondary | ICD-10-CM

## 2024-03-16 MED ORDER — TRAMADOL HCL 50 MG PO TABS: 50.0000 mg | ORAL_TABLET | Freq: Two times a day (BID) | ORAL | 5 refills | Status: AC | PRN

## 2024-03-16 NOTE — Progress Notes (Signed)
 Subjective:    Patient ID: [name], female    DOB: 1945/11/16, 78 y.o.   MRN: [id number]  HPI . [name] is a 78 y.o. year old female  who  has a past medical history of Abdominal pain, left lower quadrant (08/14/2009), Abnormal CT scan, stomach, Abnormal LFTs (06/19/2017), AICD (automatic cardioverter/defibrillator) present, Aortic arch pseudoaneurysm (HCC), Arthritis, Asthma, Benign neoplasm of colon, CAD (coronary artery disease) (02/2009), Cardiomyopathy, ischemic (06/12/2011), Chronic renal insufficiency, Chronic systolic CHF (congestive heart failure) (HCC), Chronic systolic heart failure (HCC) (02/25/7985), Colon polyp, Complication of anesthesia, COPD (chronic obstructive pulmonary disease) (HCC), CORONARY ARTERY DISEASE (04/24/2009), DDD (degenerative disc disease), lumbar (05/16/2011), Depression, Diverticulosis, DIVERTICULOSIS-COLON (08/14/2009), DYSPHAGIA UNSPECIFIED (04/24/2009), Dyspnea, Essential hypertension (09/05/2009), Gastric polyp, GERD (10/19/2007), GERD (gastroesophageal reflux disease), GI bleed, Headache (01/03/2013), Hearing loss, History of pulmonary embolus (PE) (01/02/2011), Hyperlipemia, Hyperlipidemia, unspecified (04/24/2009), Hypertension, ICD-St.Jude (08/06/2011), Insomnia, Irritable bowel syndrome, Ischemic cardiomyopathy, Memory loss, Migraine headache without aura, Myocardial infarct (HCC) (2011 x 2), PERSONAL HX COLONIC POLYPS (04/24/2009), Pneumonia, PONV (postoperative nausea and vomiting), Pre-diabetes, Pseudoaneurysm of aortic arch (HCC) (05/05/2012), Pulmonary embolism (HCC) (2011), Stroke [facility]), Thoracic aortic aneurysm (HCC) (03/31/2018), TOBACCO ABUSE (04/04/2009), Transfusion history, UNSPECIFIED ANEMIA (07/03/2010), and Unspecified mastoiditis.  They are presenting to PM&R clinic for follow up related to bilateral low back and right hip pain.   Plan from last visit:    Chronic pain syndrome Today, I have refilled the tramadol  with 45 tabs to distribute over 90  days.  I have given you 1 refill.   Right hip pain Chronic bilateral low back pain without sciatica Myofascial pain   I am prescribing Voltaren  1% topical gel to use on your low back and hips up to twice daily for pain.  You can continue to use heat to assist with pain management as well.  You can take this with Tylenol .   Continue hip injections with orthopedics   Follow-up with me in 6 months, or sooner if needed.   Interval Hx:  - Therapies: No exercises since her back hsa been hurting her. She has free weights at home and usually does exercises 4 times a week. She has been going on vacation and went to the mountains - used a pain pill and walked all over the mountain.   - Follow ups: She has been having stomach issues - pains in her upper abdomen - but havent been able to se her PCP yet for it. Has been seen for migraines which re stable.   Hasn't needed hip injections for awhile! Is sleeping through the night with no pain.    - Falls: none   - DME: none - son wants her to use a wlaker but she declines because those are for old peope    - Medications: tramadol  50 mg #30 refilled 01/14/24   - Other concerns: started getting pain in her neck coming across both shoulders into the back for the last 4 weeks. No events starting, Is using a heat pack which helps.    Pain Inventory Average Pain 10 Pain Right Now 6 My pain is sharp  In the last 24 hours, has pain interfered with the following? General activity 10 Relation with others 7 Enjoyment of life 8 What TIME of day is your pain at its worst? daytime Sleep (in general) Poor  Pain is worse with: walking, inactivity, and standing Pain improves with: heat/ice and medication Relief from Meds: 8  Family History  Problem Relation Age of  Onset   Colon cancer Mother    Other Brother        tumor in his brain   Bladder Cancer Brother    Throat cancer Brother    Cancer Son        kidney, spread to his liver   Breast  cancer Cousin    Social History   Socioeconomic History   Marital status: Divorced    Spouse name: Not on file   Number of children: 2   Years of education: 11   Highest education level: 12th grade  Occupational History   Occupation: Designer, industrial/product: UNEMPLOYED    Employer: DISABLED  Tobacco Use   Smoking status: Former    Current packs/day: 0.13    Average packs/day: 0.1 packs/day for 30.0 years (3.9 ttl pk-yrs)    Types: Cigarettes   Smokeless tobacco: Never  Vaping Use   Vaping status: Never Used  Substance and Sexual Activity   Alcohol use: No    Alcohol/week: 0.0 standard drinks of alcohol   Drug use: No   Sexual activity: Not Currently  Other Topics Concern   Not on file  Social History Narrative   Pt lives in [place] alone. 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).Patient has 11 th grade education.Right handed.Caffeine - one cup daily   Social Drivers of Health   Financial Resource Strain: Low Risk  (02/23/2024)   Overall Financial Resource Strain (CARDIA)    Difficulty of Paying Living Expenses: Not hard at all  Food Insecurity: No Food Insecurity (02/23/2024)   Hunger Vital Sign    Worried About Running Out of Food in the Last Year: Never true    Ran Out of Food in the Last Year: Never true  Transportation Needs: No Transportation Needs (02/23/2024)   PRAPARE - Administrator, Civil Service (Medical): No    Lack of Transportation (Non-Medical): No  Physical Activity: Inactive (02/23/2024)   Exercise Vital Sign    Days of Exercise per Week: 0 days    Minutes of Exercise per Session: 0 min  Stress: No Stress Concern Present (02/23/2024)   [employer] of Occupational Health - Occupational Stress Questionnaire    Feeling of Stress: Not at all  Social Connections: Moderately Integrated (02/23/2024)   Social Connection and Isolation Panel    Frequency of Communication with Friends and Family: Once a week    Frequency of  Social Gatherings with Friends and Family: More than three times a week    Attends Religious Services: More than 4 times per year    Active Member of [employer] or Organizations: Yes    Attends Banker Meetings: Never    Marital Status: Divorced   Past Surgical History:  Procedure Laterality Date   ANGIOPLASTY  07/02/09, 04/01/10   BILATERAL SALPINGOOPHORECTOMY     CAD( bare metal stent)  02/2009   x 1   CAROTID-SUBCLAVIAN BYPASS GRAFT Left 03/31/2018   Procedure: LEFT SUBCLAVIAN ARTERY BYPASS GRAFT;  Surgeon: Serene Gaile ORN, MD;  Location: [facility] OR;  Service: Vascular;  Laterality: Left;   CERVICAL SPINE SURGERY  08/08   COLONOSCOPY WITH PROPOFOL  N/A 04/29/2016   Procedure: COLONOSCOPY WITH PROPOFOL ;  Surgeon: Elspeth Deward Naval, MD;  Location: [facility];  Service: Gastroenterology;  Laterality: N/A;   ESOPHAGOGASTRODUODENOSCOPY N/A 12/09/2016   Procedure: ESOPHAGOGASTRODUODENOSCOPY (EGD);  Surgeon: Naval Elspeth Deward, MD;  Location: [facility];  Service: Gastroenterology;  Laterality: N/A;   ESOPHAGOGASTRODUODENOSCOPY (EGD) WITH  PROPOFOL  N/A 04/29/2016   Procedure: ESOPHAGOGASTRODUODENOSCOPY (EGD) WITH PROPOFOL ;  Surgeon: Elspeth Deward Naval, MD;  Location: [facility];  Service: Gastroenterology;  Laterality: N/A;   EUS N/A 05/15/2016   Procedure: UPPER ENDOSCOPIC ULTRASOUND (EUS) RADIAL;  Surgeon: Toribio SHAUNNA Cedar, MD;  Location: [facility];  Service: Endoscopy;  Laterality: N/A;   ICD GENERATOR CHANGEOUT N/A 11/04/2022   Procedure: ICD GENERATOR CHANGEOUT;  Surgeon: Nancey, Eulas BRAVO, MD;  [facility] INVASIVE CV LAB;  Service: Cardiovascular;  Laterality: N/A;   IMPLANTABLE CARDIOVERTER DEFIBRILLATOR IMPLANT N/A 08/05/2011   Primary prevention SJM ICD implanted,  Analyze ST study patient   INNER EAR SURGERY     left x 17   LUMBAR DISC SURGERY  02/2008   fusion   NASAL SEPTUM SURGERY     RIGHT HEART CATH N/A 12/11/2021   Procedure: RIGHT HEART CATH;  Surgeon:  Wonda Sharper, MD;  Location: [facility] INVASIVE CV LAB;  Service: Cardiovascular;  Laterality: N/A;   THORACIC AORTIC ENDOVASCULAR STENT GRAFT N/A 03/31/2018   Procedure: THORACIC AORTIC ENDOVASCULAR STENT GRAFT;  Surgeon: Serene Gaile ORN, MD;  Location: [facility] OR;  Service: Vascular;  Laterality: N/A;   TOTAL ABDOMINAL HYSTERECTOMY     complete   Past Surgical History:  Procedure Laterality Date   ANGIOPLASTY  07/02/09, 04/01/10   BILATERAL SALPINGOOPHORECTOMY     CAD( bare metal stent)  02/2009   x 1   CAROTID-SUBCLAVIAN BYPASS GRAFT Left 03/31/2018   Procedure: LEFT SUBCLAVIAN ARTERY BYPASS GRAFT;  Surgeon: Serene Gaile ORN, MD;  Location: [facility];  Service: Vascular;  Laterality: Left;   CERVICAL SPINE SURGERY  08/08   COLONOSCOPY WITH PROPOFOL  N/A 04/29/2016   Procedure: COLONOSCOPY WITH PROPOFOL ;  Surgeon: Elspeth Deward Naval, MD;  Location: [facility];  Service: Gastroenterology;  Laterality: N/A;   ESOPHAGOGASTRODUODENOSCOPY N/A 12/09/2016   Procedure: ESOPHAGOGASTRODUODENOSCOPY (EGD);  Surgeon: Naval Elspeth Deward, MD;  Location: [facility];  Service: Gastroenterology;  Laterality: N/A;   ESOPHAGOGASTRODUODENOSCOPY (EGD) WITH PROPOFOL  N/A 04/29/2016   Procedure: ESOPHAGOGASTRODUODENOSCOPY (EGD) WITH PROPOFOL ;  Surgeon: Elspeth Deward Naval, MD;  Location: [facility];  Service: Gastroenterology;  Laterality: N/A;   EUS N/A 05/15/2016   Procedure: UPPER ENDOSCOPIC ULTRASOUND (EUS) RADIAL;  Surgeon: Toribio SHAUNNA Cedar, MD;  Location: [facility];  Service: Endoscopy;  Laterality: N/A;   ICD GENERATOR CHANGEOUT N/A 11/04/2022   Procedure: ICD GENERATOR CHANGEOUT;  Surgeon: Nancey, Eulas BRAVO, MD;  Location: [facility] INVASIVE CV LAB;  Service: Cardiovascular;  Laterality: N/A;   IMPLANTABLE CARDIOVERTER DEFIBRILLATOR IMPLANT N/A 08/05/2011   Primary prevention SJM ICD implanted,  Analyze ST study patient   INNER EAR SURGERY     left x 17   LUMBAR DISC SURGERY  02/2008   fusion   NASAL SEPTUM  SURGERY     RIGHT HEART CATH N/A 12/11/2021   Procedure: RIGHT HEART CATH;  Surgeon: Wonda Sharper, MD;  Location: [facility] INVASIVE CV LAB;  Service: Cardiovascular;  Laterality: N/A;   THORACIC AORTIC ENDOVASCULAR STENT GRAFT N/A 03/31/2018   Procedure: THORACIC AORTIC ENDOVASCULAR STENT GRAFT;  Surgeon: Serene Gaile ORN, MD;  Location: [facility];  Service: Vascular;  Laterality: N/A;   TOTAL ABDOMINAL HYSTERECTOMY     complete   Past Medical History:  Diagnosis Date   Abdominal pain, left lower quadrant 08/14/2009   Qualifier: Diagnosis of  By: Drucilla BANANA, Pam     Abnormal CT scan, stomach    Abnormal LFTs 06/19/2017   AICD (automatic cardioverter/defibrillator) present    Dr.Allred follows  Aortic arch pseudoaneurysm (HCC)    a. followed by Dr. Fleeta Ochoa.   Arthritis    Asthma    Benign neoplasm of colon    CAD (coronary artery disease) 02/2009   a. anterior STEMI rx with BMS to prox LAD in 02/2009. b. ISR s/p PTCA 06/2009. c. ISR s/p thrombectomy & PTCA 04-02-10 due to late stent thrombosis. // Myoview  04/2019: EF 28, ant, ant-sept, inf-sept scar, no ischemia, high risk (stable>>cont med Rx)    Cardiomyopathy, ischemic 06/12/2011   Chronic renal insufficiency    stage 3   Chronic systolic CHF (congestive heart failure) (HCC)    a. s/p ST. Jude ICD April 02, 2012.   Chronic systolic heart failure (HCC) 08/05/2011   Colon polyp    Complication of anesthesia    COPD (chronic obstructive pulmonary disease) (HCC)    CORONARY ARTERY DISEASE 04/24/2009   Qualifier: Diagnosis of  By: Ever Riggers, Amy S    DDD (degenerative disc disease), lumbar 05/16/2011   Depression    since my son died in 11/01/2023 April 02, 2018)   Diverticulosis    DIVERTICULOSIS-COLON 08/14/2009   Qualifier: Diagnosis of  By: Ever PA-c, Amy S    DYSPHAGIA UNSPECIFIED 04/24/2009   Qualifier: Diagnosis of  By: Drucilla BANANA, Pam     Dyspnea    when I lay down at night    Essential hypertension 09/05/2009   Qualifier: Diagnosis of   By: Thalia CMA, Jennifer     Gastric polyp    GERD 10/19/2007   Qualifier: Diagnosis of  By: Kimble RN, [place]: Diagnosis of  By: Drucilla BANANA, Pam     GERD (gastroesophageal reflux disease)    GI bleed    a. h/o GIB on DAPT, now on ASA only.   Headache 01/03/2013   Hearing loss    left ear   History of pulmonary embolus (PE) 01/02/2011   Hyperlipemia    Hyperlipidemia, unspecified 04/24/2009   Qualifier: Diagnosis of  By: Ever PA-c, Amy S    Hypertension    ICD-St.Jude 08/06/2011   S/p St. Jude ICD placement 08/05/11    Insomnia    Irritable bowel syndrome    Ischemic cardiomyopathy    EF 10-15%   Memory loss    Migraine headache without aura    Myocardial infarct (HCC) 2011 x 2   Dr. Cooper,cardiology    PERSONAL HX COLONIC POLYPS 04/24/2009   Qualifier: Diagnosis of  By: Ever Riggers, Amy S November, 2011 colonoscopy demonstrated a sessile cecal polyp and sigmoid polyp    Pneumonia    PONV (postoperative nausea and vomiting)    Pre-diabetes    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   Pulmonary embolism (HCC) 2010/04/02   Stroke [facility])    'When I was young. no residual   Thoracic aortic aneurysm (HCC) 03/31/2018   TOBACCO ABUSE 04/04/2009   Qualifier: Diagnosis of  By: Parthenia RIGGERS Olivia Mason  Qualifier: Diagnosis of  By: Morris, MD, CODY Debby Lenis    Transfusion history    ?'12 or '13   UNSPECIFIED ANEMIA 07/03/2010   Qualifier: Diagnosis of  By: Arlyss Math     Unspecified mastoiditis    BP 137/83 (BP Location: Left Arm, Patient Position: Sitting, Cuff Size: Large)   Pulse 83   Ht 5' 2 (1.575 m)   Wt 171 lb 3.2 oz (77.7 kg)   SpO2 94%   BMI 31.31 kg/m   Opioid Risk Score:   Fall Risk Score:  `  1  Depression screen PHQ 2/9     03/16/2024    2:28 PM 02/23/2024   11:38 AM 12/23/2023    1:56 PM 08/31/2023    1:24 PM 08/10/2023    1:57 PM 07/15/2023    1:53 PM 06/02/2023    2:28 PM  Depression screen PHQ 2/9  Decreased Interest 0 0 0 0 0 0 0   Down, Depressed, Hopeless 0 0 3 0 0 0 0  PHQ - 2 Score 0 0 3 0 0 0 0  Altered sleeping  2 2      Tired, decreased energy  0 0      Change in appetite  0 0      Feeling bad or failure about yourself   0 3      Trouble concentrating  1 0      Moving slowly or fidgety/restless  0 0      Suicidal thoughts  0       PHQ-9 Score  3 8      Difficult doing work/chores  Not difficult at all Not difficult at all          Review of Systems  Musculoskeletal:  Positive for back pain.       Upper and lower back pain  All other systems reviewed and are negative.      Objective:   Physical Exam   PE: Constitution: Appropriate appearance for age. No apparent distress   Resp: No respiratory distress. No accessory muscle usage.  Cardio: Well perfused appearance.  No peripheral edema. Abdomen: Nondistended. Nontender.   Psych: Appropriate mood and affect. Neuro: AAOx4. No apparent cognitive deficits     Msk: Back Limited sidebending and rotation to the left; otherwise normal neck AROM Minimal TTP b/l cervical paraspinals, trapezius Negative lhermitte's, spurling's b/l; no TTP BL shoulders  ++ TTP bilateral lumbar paraspinals  Gait: normal stands and stride     Assessment & Plan:   [name] is a 78 y.o. year old female  who  has a past medical history of Abdominal pain, left lower quadrant (08/14/2009), Abnormal CT scan, stomach, Abnormal LFTs (06/19/2017), AICD (automatic cardioverter/defibrillator) present, Aortic arch pseudoaneurysm (HCC), Arthritis, Asthma, Benign neoplasm of colon, CAD (coronary artery disease) (02/2009), Cardiomyopathy, ischemic (06/12/2011), Chronic renal insufficiency, Chronic systolic CHF (congestive heart failure) (HCC), Chronic systolic heart failure (HCC) (02/25/7985), Colon polyp, Complication of anesthesia, COPD (chronic obstructive pulmonary disease) (HCC), CORONARY ARTERY DISEASE (04/24/2009), DDD (degenerative disc disease), lumbar (05/16/2011),  Depression, Diverticulosis, DIVERTICULOSIS-COLON (08/14/2009), DYSPHAGIA UNSPECIFIED (04/24/2009), Dyspnea, Essential hypertension (09/05/2009), Gastric polyp, GERD (10/19/2007), GERD (gastroesophageal reflux disease), GI bleed, Headache (01/03/2013), Hearing loss, History of pulmonary embolus (PE) (01/02/2011), Hyperlipemia, Hyperlipidemia, unspecified (04/24/2009), Hypertension, ICD-St.Jude (08/06/2011), Insomnia, Irritable bowel syndrome, Ischemic cardiomyopathy, Memory loss, Migraine headache without aura, Myocardial infarct (HCC) (2011 x 2), PERSONAL HX COLONIC POLYPS (04/24/2009), Pneumonia, PONV (postoperative nausea and vomiting), Pre-diabetes, Pseudoaneurysm of aortic arch (HCC) (05/05/2012), Pulmonary embolism (HCC) (2011), Stroke [facility]), Thoracic aortic aneurysm (HCC) (03/31/2018), TOBACCO ABUSE (04/04/2009), Transfusion history, UNSPECIFIED ANEMIA (07/03/2010), and Unspecified mastoiditis.   They are presenting to PM&R clinic for follow up related to bilateral low back and right hip pain.   Chronic pain syndrome Encounter for therapeutic drug monitoring Encounter for long-term opiate analgesic use I will refill your tramadol  for 6 months; follow up with me or eunice at this time  S/P cervical spinal fusion Cervicalgia -     DG Cervical Spine 2 or 3 views; Future  I gave exercises for your neck to do at least 3 times weekly.   Use voltaren  gel over the counter up to 4 times daily on your neck and shoulders  Continue use of heat and massage  I will call you if there are any concerning findings on neck xrays    Other orders -     traMADol  HCl; Take 1 tablet (50 mg total) by mouth every 12 (twelve) hours as needed. Can use up to twice daily if needed; do not exceed 30 tabs in 1 month  Dispense: 30 tablet; Refill: 5

## 2024-03-16 NOTE — Patient Instructions (Signed)
 I will refill your tramadol  for 6 months; follow up with me or eunice at this time  I gave exercises for your neck to do at least 3 times weekly.   Use voltaren  gel over the counter up to 4 times daily on your neck and shoulders  Continue use of heat and massage  I will call you if there are any concerning findings on neck xrays

## 2024-03-18 ENCOUNTER — Other Ambulatory Visit (HOSPITAL_COMMUNITY): Payer: Self-pay

## 2024-03-18 MED ORDER — ROSUVASTATIN CALCIUM 20 MG PO TABS
20.0000 mg | ORAL_TABLET | Freq: Every day | ORAL | 0 refills | Status: DC
Start: 2024-03-18 — End: 2024-04-13
  Filled 2024-03-18: qty 90, 90d supply, fill #0

## 2024-04-13 ENCOUNTER — Other Ambulatory Visit: Payer: Self-pay | Admitting: Cardiovascular Disease

## 2024-04-15 ENCOUNTER — Encounter: Payer: Self-pay | Admitting: Cardiovascular Disease

## 2024-04-15 ENCOUNTER — Ambulatory Visit: Payer: Self-pay

## 2024-04-15 ENCOUNTER — Ambulatory Visit: Attending: Cardiovascular Disease | Admitting: Cardiovascular Disease

## 2024-04-15 VITALS — BP 130/86 | HR 77 | Ht 61.0 in | Wt 171.8 lb

## 2024-04-15 DIAGNOSIS — E782 Mixed hyperlipidemia: Secondary | ICD-10-CM | POA: Diagnosis not present

## 2024-04-15 DIAGNOSIS — I10 Essential (primary) hypertension: Secondary | ICD-10-CM

## 2024-04-15 DIAGNOSIS — N1832 Chronic kidney disease, stage 3b: Secondary | ICD-10-CM

## 2024-04-15 DIAGNOSIS — I5020 Unspecified systolic (congestive) heart failure: Secondary | ICD-10-CM | POA: Diagnosis not present

## 2024-04-15 NOTE — Telephone Encounter (Signed)
 FYI Only or Action Required?: FYI only for provider.  Patient was last seen in primary care on 12/23/2023 by Purcell Emil Schanz, MD.  Called Nurse Triage reporting Dysuria.  Symptoms began yesterday.  Interventions attempted: Nothing.  Symptoms are: unchanged.  Triage Disposition: See HCP Within 4 Hours (Or PCP Triage)  Patient/caregiver understands and will follow disposition?: No- Patient states she doesn't drive much and is not comfortable going anywhere other than her PCP office. RN looked up nearest urgent care and was providing step by step directions and patient refused and states she would like to wait til Monday. Advised patient to consider urgent care or ED, scheduled with Dr Garald on Monday.               Copied from CRM #[id number]. Topic: Clinical - Red Word Triage >> Apr 15, 2024  8:04 AM [name] wrote: Red Word that prompted transfer to Nurse Triage: painful urination  and hurting inside Reason for Disposition  Side (flank) or lower back pain present  Answer Assessment - Initial Assessment Questions 1. SEVERITY: How bad is the pain?  (e.g., Scale 1-10; mild, moderate, or severe)     7/10.  2. FREQUENCY: How many times have you had painful urination today?      4 times.  3. PATTERN: Is pain present every time you urinate or just sometimes?      Every time.  4. ONSET: When did the painful urination start?      Last night.  5. FEVER: Do you have a fever? If Yes, ask: What is your temperature, how was it measured, and when did it start?     No.  6. PAST UTI: Have you had a urine infection before? If Yes, ask: When was the last time? and What happened that time?      Yes; several months ago. Treated with antibiotics, but states it was difficult to treat/resolved.  7. CAUSE: What do you think is causing the painful urination?  (e.g., UTI, scratch, Herpes sore)     UTI.  8. OTHER SYMPTOMS: Do you have any other symptoms?  (e.g., blood in urine, flank pain, genital sores, urgency, vaginal discharge)     Denies blood in urine, nausea, vomiting. Flank pain (right side), urinary frequency. She states she has back pain but she has been experiencing that before these symptoms started (chronic).  9. PREGNANCY: Is there any chance you are pregnant? When was your last menstrual period?     N/A.  Protocols used: Urination Pain - Female-A-AH

## 2024-04-15 NOTE — Assessment & Plan Note (Addendum)
 Creatinine on last check was 2.02.  Discussed adequate fluid hydration and avoidance of nonsteroidal anti-inflammatory drugs.  Again, I suspect she has significant risk of AKI with ACE/ARB/ARNI. Will continue her current regimen.

## 2024-04-15 NOTE — Assessment & Plan Note (Signed)
 Patient with LVEF less than 30%.  She is treated with hydralazine , bisoprolol , and furosemide .  With tenuous renal function she has not been treated with ACE/ARB/ARNI/spironolactone due to concern about her risk for AKI.

## 2024-04-15 NOTE — Assessment & Plan Note (Signed)
 Blood pressure controlled.  Continue current medications.  Most recent labs reviewed with creatinine 2.0, potassium 4.0.

## 2024-04-15 NOTE — Progress Notes (Signed)
 Cardiology Office Note:    Date:  04/15/2024   ID:  [name], DOB 1946-07-01, MRN [id number]  PCP:  Purcell Emil Schanz, MD   [facility] Providers Cardiologist:  Ozell Fell, MD Electrophysiologist:  Eulas FORBES Furbish, MD     Referring MD: Purcell Emil Schanz, *   Chief Complaint  Patient presents with   Coronary Artery Disease    History of Present Illness:    [name] is a 78 y.o. female with a hx of:  Coronary artery disease  S/p anterior MI in 2010 >> PCI: BMS to prox LAD S/p multiple PCIs since MI in 2010 2/2 ISR Myoview  04/2019: Lg scar, no ischemia, EF 28 Chronic systolic CHF Ischemic CM S/p AICD Hx of pulmonary embolism  COPD Chronic kidney disease  Prior Gi bleeding  Hypertension  Hyperlipidemia  Thoracic aortic aneurysm S/p L carotid-subclavian transposition and endovascular repair of aneurysm 03/2018  Aortic atherosclerosis  The patient is here alone today.  She reports no significant change in her health over the past year.  She notes that she has some issues with her memory.  She complains of shortness of breath with physical activity like carrying groceries in to her home.  This is unchanged over the past few years.  No chest pain or pressure.  No edema, orthopnea, PND, or heart palpitations.  She quit smoking many years ago.  She reports compliance with her medications.  Last echo in 2022 showed LVEF 25 to 30%, moderate LV dilatation, normal RV function, and no clinically significant valvular disease.  Current Medications: Current Meds  Medication Sig   acetaminophen  (TYLENOL ) 500 MG tablet Take 2 tablets (1,000 mg total) by mouth every 6 (six) hours as needed (back pain.).   amitriptyline  (ELAVIL ) 50 MG tablet TAKE 1 TABLET (50 MG TOTAL) BY MOUTH AT BEDTIME.   aspirin  81 MG EC tablet Take 81 mg by mouth at bedtime.   bisoprolol  (ZEBETA ) 5 MG tablet TAKE 1 TABLET BY MOUTH DAILY. SCHEDULE OFFICE VISIT FOR FURTHER REFILLS  [phone]   diclofenac  Sodium (VOLTAREN  ARTHRITIS PAIN) 1 % GEL Apply 2 g topically 2 (two) times daily as needed (apply to low back  and/or hips for pain).   furosemide  (LASIX ) 40 MG tablet Take 1 tablet (40 mg total) by mouth daily.   hydrALAZINE  (APRESOLINE ) 10 MG tablet TAKE 1 TABLET BY MOUTH 3 TIMES DAILY. KEEP UPCOMING APPOINTMENT FOR REFILLS   polyethylene glycol powder (GLYCOLAX /MIRALAX ) powder Take 17 g by mouth daily as needed for mild constipation.   rosuvastatin  (CRESTOR ) 20 MG tablet TAKE 1 TABLET (20 MG TOTAL) BY MOUTH AT BEDTIME. KEEP UPCOMING APPOINTMENT FOR REFILLS   STIOLTO RESPIMAT  2.5-2.5 MCG/ACT AERS INHALE 2 PUFFS INTO THE LUNGS DAILY. NEED APPOINTMENT FOR FURTHER REFILLS   topiramate  (TOPAMAX ) 50 MG tablet TAKE 1 TABLET (50 MG TOTAL) BY MOUTH DAILY.   traMADol  (ULTRAM ) 50 MG tablet Take 1 tablet (50 mg total) by mouth every 12 (twelve) hours as needed. Can use up to twice daily if needed; do not exceed 30 tabs in 1 month   [DISCONTINUED] PREMARIN  vaginal cream Place 0.5 g vaginally daily as needed (irritation.).     Allergies:   Sulfa antibiotics, Sulfonamide derivatives, and Zestril  [lisinopril ]   ROS:   Please see the history of present illness.    All other systems reviewed and are negative.  EKGs/Labs/Other Studies Reviewed:    The following studies were reviewed today: Cardiac Studies & Procedures   ______________________________________________________________________________________________  CARDIAC CATHETERIZATION  CARDIAC CATHETERIZATION 12/11/2021  Conclusion Normal right heart hemodynamics with no pulmonary hypertension  RA mean of 6 RV 28/2, RVEDP 6 PA 24/11 mean 19 Pulmonary capillary wedge pressure mean 10  PA oxygen saturation 62% SVC oxygen saturation 63% Fick cardiac output 4.6 L/min, cardiac index 2.6 L/min   STRESS TESTS  MYOCARDIAL PERFUSION IMAGING 04/17/2021  Interpretation Summary   Findings are consistent with prior  myocardial infarction. The study is high risk.   No ST deviation was noted.   LV perfusion is abnormal. There is no evidence of ischemia. There is evidence of infarction. Defect 1: There is a medium defect with severe reduction in uptake present in the mid to basal inferior and inferoseptal location(s) that is fixed. There is abnormal wall motion in the defect area. Consistent with infarction. Defect 2: There is a large defect with severe reduction in uptake present in the apical to mid anterior, anteroseptal and apex location(s) that is fixed.  Uptake is absent in the apical anterior, apical septum and apex.   Left ventricular function is abnormal. Nuclear stress EF: 18 %. The left ventricular ejection fraction is severely decreased (<30%).   Prior study available for comparison from 04/28/2019. No changes compared to prior study.  High risk stress test due to reduced systolic function and extensive prior infarct.  There is no evidence of ischemia.   ECHOCARDIOGRAM  ECHOCARDIOGRAM COMPLETE 05/01/2021  Narrative ECHOCARDIOGRAM REPORT    Patient Name:   [name] Date of Exam: 05/01/2021 Medical Rec #:  [id number]        Height:       62.0 in Accession #:    [id number]       Weight:       182.0 lb Date of Birth:  September 20, 1945        BSA:          1.837 m Patient Age:    75 years         BP:           110/74 mmHg Patient Gender: F                HR:           73 bpm. Exam Location:  [address]  Procedure: 2D Echo, Cardiac Doppler, Color Doppler and Intracardiac Opacification Agent  Indications:    R06.00 Dyspnea  History:        Patient has prior history of Echocardiogram examinations, most recent 06/13/2020. Cardiomyopathy and CHF, CAD, Defibrillator, COPD, Arrythmias:RBBB, Signs/Symptoms:Shortness of Breath; Risk Factors:Pre-diabetes, Hypertension, Dyslipidemia and Current Smoker. Thoracic aortic aneurysm. Anemia.  Sonographer:    Jon Hacker RCS Referring Phys: [id number]  CAITLIN S WALKER  IMPRESSIONS   1. Definity  contrast does not show any evidence of LV thrombus. . Left ventricular ejection fraction, by estimation, is 25 to 30%. The left ventricle has severely decreased function. The left ventricle demonstrates regional wall motion abnormalities (see scoring diagram/findings for description). The left ventricular internal cavity size was moderately dilated. Left ventricular diastolic parameters are consistent with Grade I diastolic dysfunction (impaired relaxation). There is severe akinesis of the left ventricular, mid-apical anteroseptal wall, apical segment and lateral wall. 2. Right ventricular systolic function is normal. The right ventricular size is normal. 3. Right atrial size was mildly dilated. 4. The mitral valve is normal in structure. No evidence of mitral valve regurgitation. 5. The aortic valve is normal in structure. Aortic valve regurgitation is not visualized.  FINDINGS Left  Ventricle: Definity  contrast does not show any evidence of LV thrombus. Left ventricular ejection fraction, by estimation, is 25 to 30%. The left ventricle has severely decreased function. The left ventricle demonstrates regional wall motion abnormalities. Severe akinesis of the left ventricular, mid-apical anteroseptal wall, apical segment and lateral wall. Definity  contrast agent was given IV to delineate the left ventricular endocardial borders. The left ventricular internal cavity size was moderately dilated. There is no left ventricular hypertrophy. Left ventricular diastolic parameters are consistent with Grade I diastolic dysfunction (impaired relaxation).  Right Ventricle: The right ventricular size is normal. Right vetricular wall thickness was not well visualized. Right ventricular systolic function is normal.  Left Atrium: Left atrial size was normal in size.  Right Atrium: Right atrial size was mildly dilated.  Pericardium: There is no evidence of  pericardial effusion.  Mitral Valve: The mitral valve is normal in structure. No evidence of mitral valve regurgitation.  Tricuspid Valve: The tricuspid valve is grossly normal. Tricuspid valve regurgitation is not demonstrated.  Aortic Valve: The aortic valve is normal in structure. Aortic valve regurgitation is not visualized.  Pulmonic Valve: The pulmonic valve was normal in structure. Pulmonic valve regurgitation is not visualized.  Aorta: The aortic root and ascending aorta are structurally normal, with no evidence of dilitation.  IAS/Shunts: The atrial septum is grossly normal.   LEFT VENTRICLE PLAX 2D LVIDd:         5.60 cm  Diastology LVIDs:         4.80 cm  LV e' medial:    5.03 cm/s LV PW:         1.00 cm  LV E/e' medial:  13.1 LV IVS:        1.00 cm  LV e' lateral:   5.51 cm/s LVOT diam:     2.00 cm  LV E/e' lateral: 12.0 LV SV:         73 LV SV Index:   40 LVOT Area:     3.14 cm   RIGHT VENTRICLE RV S prime:     11.00 cm/s  LEFT ATRIUM             Index       RIGHT ATRIUM LA diam:        4.00 cm 2.18 cm/m  RA Pressure: 3.00 mmHg LA Vol (A2C):   27.8 ml 15.14 ml/m LA Vol (A4C):   49.5 ml 26.95 ml/m LA Biplane Vol: 38.1 ml 20.75 ml/m AORTIC VALVE LVOT Vmax:   112.00 cm/s LVOT Vmean:  74.900 cm/s LVOT VTI:    0.231 m  AORTA Ao Root diam: 2.60 cm Ao Asc diam:  2.90 cm  MITRAL VALVE                TRICUSPID VALVE Estimated RAP:  3.00 mmHg MV Decel Time: 97 msec MV E velocity: 66.00 cm/s   SHUNTS MV A velocity: 114.00 cm/s  Systemic VTI:  0.23 m MV E/A ratio:  0.58         Systemic Diam: 2.00 cm  Aleene Passe MD Electronically signed by Aleene Passe MD Signature Date/Time: 05/01/2021/5:33:16 PM    Final          ______________________________________________________________________________________________      EKG:   EKG Interpretation Date/Time:  Friday April 15 2024 11:21:23 EDT Ventricular Rate:  77 PR Interval:  240 QRS  Duration:  138 QT Interval:  424 QTC Calculation: 479 R Axis:   -71  Text Interpretation: Sinus rhythm with  1st degree A-V block Left axis deviation Nonspecific IVCD Septal infarct (cited on or before 13-Feb-2023) When compared with ECG of 13-Feb-2023 14:22, No significant change was found Confirmed by Wonda Sharper [phone]) on 04/15/2024 11:39:38 AM    Recent Labs: 08/31/2023: ALT 12; BUN 38; Creatinine, Ser 2.02; Hemoglobin 12.0; Platelets 207.0; Potassium 4.0; Sodium 141  Recent Lipid Panel    Component Value Date/Time   CHOL 133 08/31/2023 1351   CHOL 126 12/06/2021 0852   TRIG 102.0 08/31/2023 1351   HDL 56.70 08/31/2023 1351   HDL 51 12/06/2021 0852   CHOLHDL 2 08/31/2023 1351   VLDL 20.4 08/31/2023 1351   LDLCALC 56 08/31/2023 1351   LDLCALC 53 12/06/2021 0852     Risk Assessment/Calculations:                Physical Exam:    VS:  BP 130/86   Pulse 77   Ht 5' 1 (1.549 m)   Wt 171 lb 12.8 oz (77.9 kg)   SpO2 95%   BMI 32.46 kg/m     Wt Readings from Last 3 Encounters:  04/15/24 171 lb 12.8 oz (77.9 kg)  03/16/24 171 lb 3.2 oz (77.7 kg)  02/23/24 167 lb (75.8 kg)     GEN:  Well nourished, well developed in no acute distress HEENT: Normal NECK: No JVD; No carotid bruits LYMPHATICS: No lymphadenopathy CARDIAC: RRR, no murmurs, rubs, gallops RESPIRATORY:  Clear to auscultation without rales, wheezing or rhonchi  ABDOMEN: Soft, non-tender, non-distended MUSCULOSKELETAL:  No edema; No deformity  SKIN: Warm and dry NEUROLOGIC:  Alert and oriented x 3 PSYCHIATRIC:  Normal affect   Assessment & Plan HFrEF (heart failure with reduced ejection fraction) (HCC) Patient with LVEF less than 30%.  She is treated with hydralazine , bisoprolol , and furosemide .  With tenuous renal function she has not been treated with ACE/ARB/ARNI/spironolactone due to concern about her risk for AKI. Essential hypertension Blood pressure controlled.  Continue current medications.   Most recent labs reviewed with creatinine 2.0, potassium 4.0. Mixed hyperlipidemia Treated with high intensity statin drug using rosuvastatin  20 mg.  LDL cholesterol is 56. Stage 3b chronic kidney disease (HCC) Creatinine on last check was 2.02.  Discussed adequate fluid hydration and avoidance of nonsteroidal anti-inflammatory drugs.  Again, I suspect she has significant risk of AKI with ACE/ARB/ARNI. Will continue her current regimen.             Medication Adjustments/Labs and Tests Ordered: Current medicines are reviewed at length with the patient today.  Concerns regarding medicines are outlined above.  Orders Placed This Encounter  Procedures   EKG 12-Lead   No orders of the defined types were placed in this encounter.   There are no Patient Instructions on file for this visit.   Signed, Sharper Wonda, MD  04/15/2024 11:52 AM    [facility]

## 2024-04-15 NOTE — Patient Instructions (Signed)

## 2024-04-18 ENCOUNTER — Ambulatory Visit (INDEPENDENT_AMBULATORY_CARE_PROVIDER_SITE_OTHER): Admitting: Internal Medicine

## 2024-04-18 ENCOUNTER — Encounter: Payer: Self-pay | Admitting: Internal Medicine

## 2024-04-18 ENCOUNTER — Other Ambulatory Visit: Payer: Self-pay | Admitting: Cardiovascular Disease

## 2024-04-18 VITALS — BP 138/94 | HR 63 | Temp 98.0°F | Ht 62.0 in | Wt 168.6 lb

## 2024-04-18 DIAGNOSIS — N390 Urinary tract infection, site not specified: Secondary | ICD-10-CM | POA: Diagnosis not present

## 2024-04-18 DIAGNOSIS — R350 Frequency of micturition: Secondary | ICD-10-CM

## 2024-04-18 DIAGNOSIS — B9689 Other specified bacterial agents as the cause of diseases classified elsewhere: Secondary | ICD-10-CM

## 2024-04-18 DIAGNOSIS — R7303 Prediabetes: Secondary | ICD-10-CM

## 2024-04-18 DIAGNOSIS — I255 Ischemic cardiomyopathy: Secondary | ICD-10-CM

## 2024-04-18 DIAGNOSIS — N1832 Chronic kidney disease, stage 3b: Secondary | ICD-10-CM

## 2024-04-18 LAB — POCT URINALYSIS DIP (CLINITEK)
Bilirubin, UA: NEGATIVE
Blood, UA: NEGATIVE
Glucose, UA: NEGATIVE mg/dL
Ketones, POC UA: NEGATIVE mg/dL
Nitrite, UA: NEGATIVE
Spec Grav, UA: 1.015 (ref 1.010–1.025)
Urobilinogen, UA: 0.2 U/dL
pH, UA: 6.5 (ref 5.0–8.0)

## 2024-04-18 MED ORDER — CIPROFLOXACIN HCL 250 MG PO TABS: 250.0000 mg | ORAL_TABLET | Freq: Two times a day (BID) | ORAL | 1 refills | Status: DC

## 2024-04-18 NOTE — Assessment & Plan Note (Signed)
 Recent check up was good Cont w/Rx

## 2024-04-18 NOTE — Progress Notes (Signed)
 Subjective:  Patient ID: [name], female    DOB: 05-31-1946  Age: 78 y.o. MRN: [id number]  CC: Urinary Tract Infection (Started Friday. States this happened before a lot. States there is pain involved in it. )   HPI IOMA CHISMAR presents for UTI sx's H/o UTI w/Klebsiella in 2024 C/o dysuria x 3 days. No fever Cipro  helped best in the past  Outpatient Medications Prior to Visit  Medication Sig Dispense Refill   acetaminophen  (TYLENOL ) 500 MG tablet Take 2 tablets (1,000 mg total) by mouth every 6 (six) hours as needed (back pain.). 30 tablet 0   amitriptyline  (ELAVIL ) 50 MG tablet TAKE 1 TABLET (50 MG TOTAL) BY MOUTH AT BEDTIME. 90 tablet 1   aspirin  81 MG EC tablet Take 81 mg by mouth at bedtime.     bisoprolol  (ZEBETA ) 5 MG tablet TAKE 1 TABLET BY MOUTH DAILY. SCHEDULE OFFICE VISIT FOR FURTHER REFILLS [phone] 30 tablet 1   diclofenac  Sodium (VOLTAREN  ARTHRITIS PAIN) 1 % GEL Apply 2 g topically 2 (two) times daily as needed (apply to low back  and/or hips for pain). 200 g 5   furosemide  (LASIX ) 40 MG tablet Take 1 tablet (40 mg total) by mouth daily. 90 tablet 3   hydrALAZINE  (APRESOLINE ) 10 MG tablet TAKE 1 TABLET BY MOUTH 3 TIMES DAILY. KEEP UPCOMING APPOINTMENT FOR REFILLS 30 tablet 1   polyethylene glycol powder (GLYCOLAX /MIRALAX ) powder Take 17 g by mouth daily as needed for mild constipation. 255 g 11   rosuvastatin  (CRESTOR ) 20 MG tablet TAKE 1 TABLET (20 MG TOTAL) BY MOUTH AT BEDTIME. KEEP UPCOMING APPOINTMENT FOR REFILLS 30 tablet 1   STIOLTO RESPIMAT  2.5-2.5 MCG/ACT AERS INHALE 2 PUFFS INTO THE LUNGS DAILY. NEED APPOINTMENT FOR FURTHER REFILLS 4 g 0   topiramate  (TOPAMAX ) 50 MG tablet TAKE 1 TABLET (50 MG TOTAL) BY MOUTH DAILY. 90 tablet 4   traMADol  (ULTRAM ) 50 MG tablet Take 1 tablet (50 mg total) by mouth every 12 (twelve) hours as needed. Can use up to twice daily if needed; do not exceed 30 tabs in 1 month 30 tablet 5   No facility-administered  medications prior to visit.    ROS: Review of Systems  Constitutional:  Negative for activity change, appetite change, chills, fatigue and unexpected weight change.  HENT:  Negative for congestion, mouth sores and sinus pressure.   Eyes:  Negative for visual disturbance.  Respiratory:  Negative for cough and chest tightness.   Gastrointestinal:  Negative for abdominal pain and nausea.  Genitourinary:  Positive for dysuria, frequency and urgency. Negative for difficulty urinating and vaginal pain.  Musculoskeletal:  Negative for back pain and gait problem.  Skin:  Negative for pallor and rash.  Neurological:  Negative for dizziness, tremors, weakness, numbness and headaches.  Psychiatric/Behavioral:  Negative for confusion and sleep disturbance. The patient is not nervous/anxious.     Objective:  BP (!) 138/94   Pulse 63   Temp 98 F (36.7 C) (Oral)   Ht 5' 2 (1.575 m)   Wt 168 lb 9.6 oz (76.5 kg)   SpO2 97%   BMI 30.84 kg/m   BP Readings from Last 3 Encounters:  04/18/24 (!) 138/94  04/15/24 130/86  03/16/24 137/83    Wt Readings from Last 3 Encounters:  04/18/24 168 lb 9.6 oz (76.5 kg)  04/15/24 171 lb 12.8 oz (77.9 kg)  03/16/24 171 lb 3.2 oz (77.7 kg)    Physical Exam Constitutional:  General: She is not in acute distress.    Appearance: She is well-developed. She is obese. She is not toxic-appearing.  HENT:     Head: Normocephalic.     Right Ear: External ear normal.     Left Ear: External ear normal.     Nose: Nose normal.  Eyes:     General:        Right eye: No discharge.        Left eye: No discharge.     Conjunctiva/sclera: Conjunctivae normal.     Pupils: Pupils are equal, round, and reactive to light.  Neck:     Thyroid : No thyromegaly.     Vascular: No JVD.     Trachea: No tracheal deviation.  Cardiovascular:     Rate and Rhythm: Normal rate and regular rhythm.     Heart sounds: Normal heart sounds.  Pulmonary:     Effort: No respiratory  distress.     Breath sounds: No stridor. No wheezing.  Abdominal:     General: Bowel sounds are normal. There is no distension.     Palpations: Abdomen is soft. There is no mass.     Tenderness: There is no abdominal tenderness. There is no guarding or rebound.  Musculoskeletal:        General: No tenderness.     Cervical back: Normal range of motion and neck supple. No rigidity.  Lymphadenopathy:     Cervical: No cervical adenopathy.  Skin:    Findings: No erythema or rash.  Neurological:     Cranial Nerves: No cranial nerve deficit.     Motor: No abnormal muscle tone.     Coordination: Coordination normal.     Deep Tendon Reflexes: Reflexes normal.  Psychiatric:        Behavior: Behavior normal.        Thought Content: Thought content normal.        Judgment: Judgment normal.     Lab Results  Component Value Date   WBC 11.9 (H) 08/31/2023   HGB 12.0 08/31/2023   HCT 37.0 08/31/2023   PLT 207.0 08/31/2023   GLUCOSE 115 (H) 08/31/2023   CHOL 133 08/31/2023   TRIG 102.0 08/31/2023   HDL 56.70 08/31/2023   LDLCALC 56 08/31/2023   ALT 12 08/31/2023   AST 15 08/31/2023   NA 141 08/31/2023   K 4.0 08/31/2023   CL 111 08/31/2023   CREATININE 2.02 (H) 08/31/2023   BUN 38 (H) 08/31/2023   CO2 22 08/31/2023   TSH 5.580 (H) 04/09/2021   INR 1.07 03/31/2018   HGBA1C 6.6 (H) 08/31/2023    DG Lumbar Spine Complete Result Date: 04/16/2023 CLINICAL DATA:  Low back pain 2 weeks.  No injury. EXAM: LUMBAR SPINE - COMPLETE 4+ VIEW COMPARISON:  11/01/2020 FINDINGS: Vertebral body alignment and heights are normal. There is moderate spondylosis of the lumbar spine. Posterior fusion hardware with bilateral pedicle screws intact and unchanged from L4-S1. Interbody fusion at the L4-5 and L5-S1 levels. Moderate disc space narrowing at the L3-4 level. No acute compression fracture or subluxation. Remainder of the exam is unchanged. IMPRESSION: 1. No acute findings. 2. Moderate spondylosis of  the lumbar spine with moderate disc disease at the L3-4 level. 3. Posterior fusion hardware from L4-S1 with interbody fusion at the L4-5 and L5-S1 levels. Electronically Signed   By: Toribio Agreste M.D.   On: 04/16/2023 16:45   DG Thoracic Spine 2 View Result Date: 04/16/2023 CLINICAL DATA:  Mid to  upper back pain 2 weeks. No history of injury. EXAM: THORACIC SPINE 2 VIEWS COMPARISON:  Chest x-ray 08/09/2021 FINDINGS: Vertebral body alignment is normal. There is mild spondylosis throughout the thoracic spine. Mild which compression fracture over the midthoracic spine unchanged. No acute compression fracture or subluxation. Remainder of the exam is unchanged. IMPRESSION: 1. No acute findings. 2. Mild spondylosis of the thoracic spine. Stable minimal which compression fracture midthoracic spine. Electronically Signed   By: Toribio Agreste M.D.   On: 04/16/2023 16:44   MM 3D DIAGNOSTIC MAMMOGRAM UNILATERAL RIGHT BREAST Result Date: 04/15/2023 CLINICAL DATA:  78 year old female presenting as a recall from screening for possible right breast mass. EXAM: DIGITAL DIAGNOSTIC UNILATERAL RIGHT MAMMOGRAM WITH TOMOSYNTHESIS AND CAD; ULTRASOUND RIGHT BREAST LIMITED TECHNIQUE: Right digital diagnostic mammography and breast tomosynthesis was performed. The images were evaluated with computer-aided detection. ; Targeted ultrasound examination of the right breast was performed COMPARISON:  Previous exam(s). ACR Breast Density Category b: There are scattered areas of fibroglandular density. FINDINGS: Mammogram: Right breast: Spot compression tomosynthesis views and full true lateral tomosynthesis views were performed demonstrating persistence of a small oval circumscribed mass in the upper outer left breast measuring 0.9 cm. A prior mammogram from 2011 became available after the patient left the visit today demonstrating this mass is unchanged since that time. Ultrasound: Targeted ultrasound performed throughout the upper-outer  quadrant of the right breast demonstrating a subtle hyperechoic area with small cystic space at 10 o'clock 6 cm from the nipple measuring 0.9 x 0.4 x 0.5 cm, favored to represent fat necrosis. This may correspond to the mammographic finding. No additional cystic or solid mass identified. IMPRESSION: Probably benign sonographic findings in the right breast at 10 o'clock favored to represent fat necrosis. This may correspond to a small mass identified mammographically that has been stable dating back to 2011. RECOMMENDATION: Right breast ultrasound in 6 months. I have discussed the findings and recommendations with the patient. If applicable, a reminder letter will be sent to the patient regarding the next appointment. BI-RADS CATEGORY  3: Probably benign. Electronically Signed   By: Inocente Ast M.D.   On: 04/15/2023 13:26   US  LIMITED ULTRASOUND INCLUDING AXILLA RIGHT BREAST Result Date: 04/15/2023 CLINICAL DATA:  78 year old female presenting as a recall from screening for possible right breast mass. EXAM: DIGITAL DIAGNOSTIC UNILATERAL RIGHT MAMMOGRAM WITH TOMOSYNTHESIS AND CAD; ULTRASOUND RIGHT BREAST LIMITED TECHNIQUE: Right digital diagnostic mammography and breast tomosynthesis was performed. The images were evaluated with computer-aided detection. ; Targeted ultrasound examination of the right breast was performed COMPARISON:  Previous exam(s). ACR Breast Density Category b: There are scattered areas of fibroglandular density. FINDINGS: Mammogram: Right breast: Spot compression tomosynthesis views and full true lateral tomosynthesis views were performed demonstrating persistence of a small oval circumscribed mass in the upper outer left breast measuring 0.9 cm. A prior mammogram from 2011 became available after the patient left the visit today demonstrating this mass is unchanged since that time. Ultrasound: Targeted ultrasound performed throughout the upper-outer quadrant of the right breast  demonstrating a subtle hyperechoic area with small cystic space at 10 o'clock 6 cm from the nipple measuring 0.9 x 0.4 x 0.5 cm, favored to represent fat necrosis. This may correspond to the mammographic finding. No additional cystic or solid mass identified. IMPRESSION: Probably benign sonographic findings in the right breast at 10 o'clock favored to represent fat necrosis. This may correspond to a small mass identified mammographically that has been stable dating back to 2011. RECOMMENDATION:  Right breast ultrasound in 6 months. I have discussed the findings and recommendations with the patient. If applicable, a reminder letter will be sent to the patient regarding the next appointment. BI-RADS CATEGORY  3: Probably benign. Electronically Signed   By: Inocente Ast M.D.   On: 04/15/2023 13:26    Assessment & Plan:   Problem List Items Addressed This Visit     Ischemic cardiomyopathy   Recent check up was good Cont w/Rx      Pre-diabetes   Labs were revewed      Stage 3b chronic kidney disease (HCC)   Hydrate well Monitor GFR      UTI due to Klebsiella species - Primary   H/o UTI w/Klebsiella in 2024 Probable: Will treat w/Cipro  again Urine Cx        Other Visit Diagnoses       Urinary frequency       Relevant Orders   POCT URINALYSIS DIP (CLINITEK) (Completed)   Urine Culture         Meds ordered this encounter  Medications   ciprofloxacin  (CIPRO ) 250 MG tablet    Sig: Take 1 tablet (250 mg total) by mouth 2 (two) times daily.    Dispense:  14 tablet    Refill:  1      Follow-up: Return for f/u with PCP.  Marolyn Noel, MD

## 2024-04-18 NOTE — Assessment & Plan Note (Signed)
Hydrate well ?Monitor GFR ?

## 2024-04-18 NOTE — Assessment & Plan Note (Signed)
 Labs were revewed

## 2024-04-18 NOTE — Assessment & Plan Note (Addendum)
 H/o UTI w/Klebsiella in 2024 Probable: Will treat w/Cipro  again Urine Cx

## 2024-04-19 LAB — URINE CULTURE

## 2024-04-22 NOTE — Telephone Encounter (Signed)
 Patient has had OV 9/22 for this issues with Dr. Harley

## 2024-04-25 ENCOUNTER — Ambulatory Visit
Admission: RE | Admit: 2024-04-25 | Discharge: 2024-04-25 | Disposition: A | Discharge status: Home / Self Care | Source: Ambulatory Visit | Attending: Physical Medicine and Rehabilitation | Admitting: Physical Medicine and Rehabilitation

## 2024-04-25 DIAGNOSIS — Z981 Arthrodesis status: Secondary | ICD-10-CM

## 2024-04-25 DIAGNOSIS — M542 Cervicalgia: Secondary | ICD-10-CM

## 2024-04-29 ENCOUNTER — Ambulatory Visit: Payer: Self-pay | Admitting: Physical Medicine and Rehabilitation

## 2024-04-29 DIAGNOSIS — Z981 Arthrodesis status: Secondary | ICD-10-CM

## 2024-04-29 DIAGNOSIS — G894 Chronic pain syndrome: Secondary | ICD-10-CM

## 2024-04-29 DIAGNOSIS — M542 Cervicalgia: Secondary | ICD-10-CM

## 2024-04-29 NOTE — Telephone Encounter (Signed)
 Called patient to discuss results of recent cervical x-ray, with adjacent segment disease superior to her prior fusion with facet hypertrophy, and inferior to her fusion with advanced chronic disc degeneration.  Patient states that neck pain has been getting worse since last visit, and that she is now developing cervicogenic headaches.  She has been trying ice to her neck without improvement.  No radiating symptoms past the neck to suggest radiculopathy.  Discussed localized pain control with Salonpas and lidocaine  patches over-the-counter.  Patient wishes to proceed with referral for possible nerve blocks/injections for cervical arthritis pain, referral sent to orthopedic spine for this.

## 2024-05-05 ENCOUNTER — Ambulatory Visit: Payer: 59

## 2024-05-05 DIAGNOSIS — I5020 Unspecified systolic (congestive) heart failure: Secondary | ICD-10-CM

## 2024-05-05 LAB — CUP PACEART REMOTE DEVICE CHECK
Battery Remaining Longevity: 104 mo
Battery Remaining Percentage: 85 %
Battery Voltage: 3.01 V
Brady Statistic RV Percent Paced: 1 %
Date Time Interrogation Session: 20251009020501
HighPow Impedance: 84 Ohm
Implantable Lead Connection Status: 753985
Implantable Lead Implant Date: 20130108
Implantable Lead Location: 753860
Implantable Pulse Generator Implant Date: 20240409
Lead Channel Impedance Value: 480 Ohm
Lead Channel Pacing Threshold Amplitude: 0.75 V
Lead Channel Pacing Threshold Pulse Width: 0.5 ms
Lead Channel Sensing Intrinsic Amplitude: 12 mV
Lead Channel Setting Pacing Amplitude: 2.5 V
Lead Channel Setting Pacing Pulse Width: 0.5 ms
Lead Channel Setting Sensing Sensitivity: 0.5 mV
Pulse Gen Serial Number: 211016490
Zone Setting Status: 755011

## 2024-05-05 NOTE — Progress Notes (Signed)
 Remote ICD Transmission

## 2024-05-06 ENCOUNTER — Ambulatory Visit: Payer: Self-pay | Admitting: Cardiology

## 2024-05-10 ENCOUNTER — Other Ambulatory Visit: Payer: Self-pay | Admitting: Emergency Medicine

## 2024-05-10 DIAGNOSIS — N63 Unspecified lump in unspecified breast: Secondary | ICD-10-CM

## 2024-05-10 NOTE — Progress Notes (Signed)
 Remote ICD Transmission

## 2024-05-11 ENCOUNTER — Ambulatory Visit: Admitting: Cardiovascular Disease

## 2024-05-12 ENCOUNTER — Emergency Department (HOSPITAL_BASED_OUTPATIENT_CLINIC_OR_DEPARTMENT_OTHER)

## 2024-05-12 ENCOUNTER — Ambulatory Visit: Payer: Self-pay

## 2024-05-12 ENCOUNTER — Other Ambulatory Visit: Payer: Self-pay

## 2024-05-12 ENCOUNTER — Encounter (HOSPITAL_BASED_OUTPATIENT_CLINIC_OR_DEPARTMENT_OTHER): Payer: Self-pay | Admitting: Radiology

## 2024-05-12 ENCOUNTER — Observation Stay (HOSPITAL_BASED_OUTPATIENT_CLINIC_OR_DEPARTMENT_OTHER)
Admission: EM | Admit: 2024-05-12 | Discharge: 2024-05-13 | Disposition: A | Discharge status: Home / Self Care | Source: Ambulatory Visit | Attending: Internal Medicine | Admitting: Internal Medicine

## 2024-05-12 DIAGNOSIS — E782 Mixed hyperlipidemia: Secondary | ICD-10-CM

## 2024-05-12 DIAGNOSIS — Z955 Presence of coronary angioplasty implant and graft: Secondary | ICD-10-CM | POA: Insufficient documentation

## 2024-05-12 DIAGNOSIS — I25118 Atherosclerotic heart disease of native coronary artery with other forms of angina pectoris: Secondary | ICD-10-CM | POA: Diagnosis not present

## 2024-05-12 DIAGNOSIS — N184 Chronic kidney disease, stage 4 (severe): Secondary | ICD-10-CM | POA: Diagnosis not present

## 2024-05-12 DIAGNOSIS — Z86711 Personal history of pulmonary embolism: Secondary | ICD-10-CM | POA: Insufficient documentation

## 2024-05-12 DIAGNOSIS — I255 Ischemic cardiomyopathy: Secondary | ICD-10-CM

## 2024-05-12 DIAGNOSIS — Z7982 Long term (current) use of aspirin: Secondary | ICD-10-CM | POA: Diagnosis not present

## 2024-05-12 DIAGNOSIS — I130 Hypertensive heart and chronic kidney disease with heart failure and stage 1 through stage 4 chronic kidney disease, or unspecified chronic kidney disease: Secondary | ICD-10-CM | POA: Diagnosis not present

## 2024-05-12 DIAGNOSIS — Z743 Need for continuous supervision: Secondary | ICD-10-CM | POA: Diagnosis not present

## 2024-05-12 DIAGNOSIS — J449 Chronic obstructive pulmonary disease, unspecified: Secondary | ICD-10-CM | POA: Diagnosis present

## 2024-05-12 DIAGNOSIS — R0602 Shortness of breath: Secondary | ICD-10-CM | POA: Diagnosis present

## 2024-05-12 DIAGNOSIS — G8929 Other chronic pain: Secondary | ICD-10-CM | POA: Insufficient documentation

## 2024-05-12 DIAGNOSIS — R0789 Other chest pain: Secondary | ICD-10-CM | POA: Diagnosis present

## 2024-05-12 DIAGNOSIS — I2510 Atherosclerotic heart disease of native coronary artery without angina pectoris: Secondary | ICD-10-CM | POA: Insufficient documentation

## 2024-05-12 DIAGNOSIS — I700 Atherosclerosis of aorta: Secondary | ICD-10-CM | POA: Diagnosis not present

## 2024-05-12 DIAGNOSIS — I5020 Unspecified systolic (congestive) heart failure: Secondary | ICD-10-CM | POA: Diagnosis present

## 2024-05-12 DIAGNOSIS — R531 Weakness: Secondary | ICD-10-CM | POA: Diagnosis not present

## 2024-05-12 DIAGNOSIS — Z95810 Presence of automatic (implantable) cardiac defibrillator: Secondary | ICD-10-CM | POA: Diagnosis present

## 2024-05-12 DIAGNOSIS — R072 Precordial pain: Secondary | ICD-10-CM | POA: Diagnosis not present

## 2024-05-12 DIAGNOSIS — I5022 Chronic systolic (congestive) heart failure: Secondary | ICD-10-CM | POA: Insufficient documentation

## 2024-05-12 DIAGNOSIS — Z79899 Other long term (current) drug therapy: Secondary | ICD-10-CM | POA: Insufficient documentation

## 2024-05-12 DIAGNOSIS — R079 Chest pain, unspecified: Principal | ICD-10-CM | POA: Diagnosis present

## 2024-05-12 DIAGNOSIS — Z87891 Personal history of nicotine dependence: Secondary | ICD-10-CM | POA: Insufficient documentation

## 2024-05-12 DIAGNOSIS — I25119 Atherosclerotic heart disease of native coronary artery with unspecified angina pectoris: Secondary | ICD-10-CM | POA: Diagnosis present

## 2024-05-12 DIAGNOSIS — Z8673 Personal history of transient ischemic attack (TIA), and cerebral infarction without residual deficits: Secondary | ICD-10-CM | POA: Insufficient documentation

## 2024-05-12 DIAGNOSIS — E785 Hyperlipidemia, unspecified: Secondary | ICD-10-CM | POA: Diagnosis present

## 2024-05-12 DIAGNOSIS — I10 Essential (primary) hypertension: Secondary | ICD-10-CM | POA: Diagnosis present

## 2024-05-12 LAB — CBC
HCT: 37.9 % (ref 36.0–46.0)
Hemoglobin: 12.8 g/dL (ref 12.0–15.0)
MCH: 33.6 pg (ref 26.0–34.0)
MCHC: 33.8 g/dL (ref 30.0–36.0)
MCV: 99.5 fL (ref 80.0–100.0)
Platelets: 208 K/uL (ref 150–400)
RBC: 3.81 MIL/uL — ABNORMAL LOW (ref 3.87–5.11)
RDW: 15.4 % (ref 11.5–15.5)
WBC: 8.9 K/uL (ref 4.0–10.5)
nRBC: 0.3 % — ABNORMAL HIGH (ref 0.0–0.2)

## 2024-05-12 LAB — RESP PANEL BY RT-PCR (RSV, FLU A&B, COVID)  RVPGX2
Influenza A by PCR: NEGATIVE
Influenza B by PCR: NEGATIVE
Resp Syncytial Virus by PCR: NEGATIVE
SARS Coronavirus 2 by RT PCR: NEGATIVE

## 2024-05-12 LAB — BASIC METABOLIC PANEL WITH GFR
Anion gap: 15 (ref 5–15)
BUN: 20 mg/dL (ref 8–23)
CO2: 18 mmol/L — ABNORMAL LOW (ref 22–32)
Calcium: 9.6 mg/dL (ref 8.9–10.3)
Chloride: 105 mmol/L (ref 98–111)
Creatinine, Ser: 2.26 mg/dL — ABNORMAL HIGH (ref 0.44–1.00)
GFR, Estimated: 22 mL/min — ABNORMAL LOW (ref >60)
Glucose, Bld: 134 mg/dL — ABNORMAL HIGH (ref 70–99)
Potassium: 3.6 mmol/L (ref 3.5–5.1)
Sodium: 138 mmol/L (ref 135–145)

## 2024-05-12 LAB — URINALYSIS, W/ REFLEX TO CULTURE (INFECTION SUSPECTED)
Bacteria, UA: NONE SEEN
Bilirubin Urine: NEGATIVE
Glucose, UA: NEGATIVE mg/dL
Hgb urine dipstick: NEGATIVE
Ketones, ur: NEGATIVE mg/dL
Leukocytes,Ua: NEGATIVE
Nitrite: NEGATIVE
Specific Gravity, Urine: >1.046 — ABNORMAL HIGH (ref 1.005–1.030)
pH: 6 (ref 5.0–8.0)

## 2024-05-12 LAB — TROPONIN T, HIGH SENSITIVITY
Troponin T High Sensitivity: 35 ng/L — ABNORMAL HIGH (ref 0–19)
Troponin T High Sensitivity: 32 ng/L — ABNORMAL HIGH (ref 0–19)

## 2024-05-12 LAB — PRO BRAIN NATRIURETIC PEPTIDE: Pro Brain Natriuretic Peptide: 1606 pg/mL — ABNORMAL HIGH (ref <300.0)

## 2024-05-12 MED ORDER — SODIUM CHLORIDE 0.9% FLUSH
3.0000 mL | Freq: Two times a day (BID) | INTRAVENOUS | Status: DC
  Administered 2024-05-12: 3 mL via INTRAVENOUS

## 2024-05-12 MED ORDER — TRAMADOL HCL 50 MG PO TABS
50.0000 mg | ORAL_TABLET | Freq: Two times a day (BID) | ORAL | Status: DC | PRN
  Administered 2024-05-13: 50 mg via ORAL
  Filled 2024-05-12: qty 1

## 2024-05-12 MED ORDER — AMITRIPTYLINE HCL 25 MG PO TABS
50.0000 mg | ORAL_TABLET | Freq: Every day | ORAL | Status: DC
  Administered 2024-05-12: 50 mg via ORAL
  Filled 2024-05-12: qty 2

## 2024-05-12 MED ORDER — IOHEXOL 350 MG/ML SOLN
80.0000 mL | Freq: Once | INTRAVENOUS | Status: AC | PRN
  Administered 2024-05-12: 80 mL via INTRAVENOUS

## 2024-05-12 MED ORDER — HYDRALAZINE HCL 10 MG PO TABS
10.0000 mg | ORAL_TABLET | Freq: Three times a day (TID) | ORAL | Status: AC
Start: 2024-05-13 — End: ?
  Administered 2024-05-13 (×2): 10 mg via ORAL
  Filled 2024-05-12 (×2): qty 1

## 2024-05-12 MED ORDER — ALBUTEROL SULFATE (2.5 MG/3ML) 0.083% IN NEBU: 2.5000 mg | INHALATION_SOLUTION | Freq: Four times a day (QID) | RESPIRATORY_TRACT | Status: DC | PRN

## 2024-05-12 MED ORDER — ONDANSETRON HCL 4 MG/2ML IJ SOLN: 4.0000 mg | Freq: Four times a day (QID) | INTRAMUSCULAR | Status: DC | PRN

## 2024-05-12 MED ORDER — SODIUM CHLORIDE 0.9 % IV BOLUS
500.0000 mL | Freq: Once | INTRAVENOUS | Status: AC
  Administered 2024-05-12: 500 mL via INTRAVENOUS

## 2024-05-12 MED ORDER — ONDANSETRON HCL 4 MG PO TABS: 4.0000 mg | ORAL_TABLET | Freq: Four times a day (QID) | ORAL | Status: DC | PRN

## 2024-05-12 MED ORDER — ACETAMINOPHEN 650 MG RE SUPP: 650.0000 mg | Freq: Four times a day (QID) | RECTAL | Status: DC | PRN

## 2024-05-12 MED ORDER — UMECLIDINIUM BROMIDE 62.5 MCG/ACT IN AEPB
1.0000 | INHALATION_SPRAY | Freq: Every day | RESPIRATORY_TRACT | Status: DC
  Administered 2024-05-13: 1 via RESPIRATORY_TRACT
  Filled 2024-05-12: qty 7

## 2024-05-12 MED ORDER — NITROGLYCERIN 0.4 MG SL SUBL: 0.4000 mg | SUBLINGUAL_TABLET | SUBLINGUAL | Status: DC | PRN

## 2024-05-12 MED ORDER — BISACODYL 5 MG PO TBEC: 5.0000 mg | DELAYED_RELEASE_TABLET | Freq: Every day | ORAL | Status: DC | PRN

## 2024-05-12 MED ORDER — ARFORMOTEROL TARTRATE 15 MCG/2ML IN NEBU
15.0000 ug | INHALATION_SOLUTION | Freq: Two times a day (BID) | RESPIRATORY_TRACT | Status: DC
  Administered 2024-05-12 – 2024-05-13 (×2): 15 ug via RESPIRATORY_TRACT
  Filled 2024-05-12 (×2): qty 2

## 2024-05-12 MED ORDER — SENNOSIDES-DOCUSATE SODIUM 8.6-50 MG PO TABS: 1.0000 | ORAL_TABLET | Freq: Every evening | ORAL | Status: DC | PRN

## 2024-05-12 MED ORDER — ASPIRIN 81 MG PO CHEW
324.0000 mg | CHEWABLE_TABLET | Freq: Once | ORAL | Status: AC
  Administered 2024-05-12: 324 mg via ORAL
  Filled 2024-05-12: qty 4

## 2024-05-12 MED ORDER — HEPARIN SODIUM (PORCINE) 5000 UNIT/ML IJ SOLN
5000.0000 [IU] | Freq: Three times a day (TID) | INTRAMUSCULAR | Status: DC
  Administered 2024-05-12 – 2024-05-13 (×3): 5000 [IU] via SUBCUTANEOUS
  Filled 2024-05-12 (×3): qty 1

## 2024-05-12 MED ORDER — ROSUVASTATIN CALCIUM 20 MG PO TABS
20.0000 mg | ORAL_TABLET | Freq: Every day | ORAL | Status: DC
  Administered 2024-05-12: 20 mg via ORAL
  Filled 2024-05-12: qty 1

## 2024-05-12 MED ORDER — METOPROLOL TARTRATE 25 MG PO TABS
25.0000 mg | ORAL_TABLET | Freq: Two times a day (BID) | ORAL | Status: DC
  Administered 2024-05-12 – 2024-05-13 (×2): 25 mg via ORAL
  Filled 2024-05-12 (×2): qty 1

## 2024-05-12 MED ORDER — ACETAMINOPHEN 325 MG PO TABS
650.0000 mg | ORAL_TABLET | Freq: Four times a day (QID) | ORAL | Status: DC | PRN
  Administered 2024-05-13: 650 mg via ORAL

## 2024-05-12 MED ORDER — ASPIRIN 81 MG PO TBEC
81.0000 mg | DELAYED_RELEASE_TABLET | Freq: Every day | ORAL | Status: AC
Start: 2024-05-13 — End: ?

## 2024-05-12 MED ORDER — ISOSORBIDE MONONITRATE ER 30 MG PO TB24
15.0000 mg | ORAL_TABLET | Freq: Every day | ORAL | Status: AC
Start: 2024-05-12 — End: ?
  Administered 2024-05-12 – 2024-05-13 (×2): 15 mg via ORAL
  Filled 2024-05-12 (×2): qty 1

## 2024-05-12 NOTE — Hospital Course (Signed)
 [name] is a 78 y.o. female with medical history significant for CAD s/p BMS to pLAD with multiple subsequent PCI, chronic HFrEF (EF 25-30% 2022) s/p AICD, history of PE s/p Coumadin course, CKD stage IV, COPD, HTN, HLD, thoracic aortic aneurysm s/p Lt carotid-subclavian transposition and endovascular repair 2019 who is admitted for evaluation of chest pain.

## 2024-05-12 NOTE — ED Notes (Signed)
 Dr. Mannie requesting patient go to CT before labs result. CT called and made aware of provider request.

## 2024-05-12 NOTE — ED Triage Notes (Signed)
 Patient states chest pain and weakness that began this morning. Hx of heart failure.

## 2024-05-12 NOTE — H&P (Signed)
 History and Physical    [name] FMW:[id number] DOB: 1946/07/11 DOA: 05/12/2024  PCP: Purcell Emil Schanz, MD  Patient coming from: Home  I have personally briefly reviewed patient's old medical records in [facility]  Chief Complaint: Chest pain  HPI: [name] is a 78 y.o. female with medical history significant for CAD s/p BMS to pLAD with multiple subsequent PCI, chronic HFrEF (EF 25-30% 14-Jun-2021) s/p AICD, history of PE s/p Coumadin course, CKD stage IV, COPD, HTN, HLD, thoracic aortic aneurysm s/p Lt carotid-subclavian transposition and endovascular repair 06/14/2018 who presented to the ED for evaluation of chest pain.  Patient states she developed central sternal chest pain this morning which has been relatively constant until she was given aspirin  with some relief.  Pain has been nonradiating.  She has not had any associated nausea, vomiting, diaphoresis or significant dyspnea.  She reports good urine output.  She has not seen any swelling in her lower extremities.  [facility] ED Course  Labs/Imaging on admission: I have personally reviewed following labs and imaging studies.  Initial vitals showed BP 143/92, pulse 105, RR 17, temp 98.2 F, SpO2 93% on room air.  Labs showed troponin T 35 > 32, proBNP 1606, sodium 138, potassium 3.6, bicarb 18, BUN 20, creatinine 2.26, serum glucose 134.  SARS-CoV-2, influenza, RSV PCR negative.  CTA chest/abdomen/pelvis IMPRESSION: 1. Similarly well seated endovascular aortic stent graft in the arch and proximal descending aorta. No findings to suggest endoleak. No aortic aneurysm, intramural hematoma, or aortic dissection. 2. No pneumonia, pulmonary edema, or pleural effusion. No acute abnormality within the abdomen or pelvis.  Patient was given 500 cc normal saline, aspirin  324 mg.  EDP discussed with cardiology who recommended medical admission and they will see in consultation.  The hospitalist service was consulted  for admission.  Review of Systems: All systems reviewed and are negative except as documented in history of present illness above.   Past Medical History:  Diagnosis Date   Abdominal pain, left lower quadrant 08/14/2009   Qualifier: Diagnosis of  By: Drucilla BANANA, Pam     Abnormal CT scan, stomach    Abnormal LFTs 06/19/2017   AICD (automatic cardioverter/defibrillator) present    Dr.Allred follows   Aortic arch pseudoaneurysm    a. followed by Dr. Fleeta Ochoa.   Arthritis    Asthma    Benign neoplasm of colon    CAD (coronary artery disease) 02/2009   a. anterior STEMI rx with BMS to prox LAD in 02/2009. b. ISR s/p PTCA 06/2009. c. ISR s/p thrombectomy & PTCA 03/2010 due to late stent thrombosis. // Myoview  2019-06-15: EF 28, ant, ant-sept, inf-sept scar, no ischemia, high risk (stable>>cont med Rx)    Cardiomyopathy, ischemic 06/12/2011   Chronic renal insufficiency    stage 3   Chronic systolic CHF (congestive heart failure) (HCC)    a. s/p ST. Jude ICD 14-Jun-2012.   Chronic systolic heart failure (HCC) 08/05/2011   Colon polyp    Complication of anesthesia    COPD (chronic obstructive pulmonary disease) (HCC)    CORONARY ARTERY DISEASE 04/24/2009   Qualifier: Diagnosis of  By: Ever Riggers, Amy S    DDD (degenerative disc disease), lumbar 05/16/2011   Depression    since my son died in 12-14-23 June 14, 2018)   Diverticulosis    DIVERTICULOSIS-COLON 08/14/2009   Qualifier: Diagnosis of  By: Ever Riggers, Amy S    DYSPHAGIA UNSPECIFIED 04/24/2009   Qualifier: Diagnosis of  By: Peterman NCMA, Pam     Dyspnea    when I lay down at night    Essential hypertension 09/05/2009   Qualifier: Diagnosis of  By: Thalia CMA, Jennifer     Gastric polyp    GERD 10/19/2007   Qualifier: Diagnosis of  By: Kimble RN, [place]: Diagnosis of  By: Drucilla BANANA, Pam     GERD (gastroesophageal reflux disease)    GI bleed    a. h/o GIB on DAPT, now on ASA only.   Headache 01/03/2013   Hearing loss     left ear   History of pulmonary embolus (PE) 01/02/2011   Hyperlipemia    Hyperlipidemia, unspecified 04/24/2009   Qualifier: Diagnosis of  By: Ever PA-c, Amy S    Hypertension    ICD-St.Jude 08/06/2011   S/p St. Jude ICD placement 08/05/11    Insomnia    Irritable bowel syndrome    Ischemic cardiomyopathy    EF 10-15%   Memory loss    Migraine headache without aura    Myocardial infarct (HCC) 2011 x 2   Dr. Cooper,cardiology    PERSONAL HX COLONIC POLYPS 04/24/2009   Qualifier: Diagnosis of  By: Ever Riggers, Amy S November, 2011 colonoscopy demonstrated a sessile cecal polyp and sigmoid polyp    Pneumonia    PONV (postoperative nausea and vomiting)    Pre-diabetes    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/05/2012   Pulmonary embolism (HCC) 2011   Stroke [facility])    'When I was young. no residual   Thoracic aortic aneurysm 03/31/2018   TOBACCO ABUSE 04/04/2009   Qualifier: Diagnosis of  By: Parthenia RIGGERS [name]  Qualifier: Diagnosis of  By: Morris, MD, CODY Debby Lenis    Transfusion history    ?'12 or '13   UNSPECIFIED ANEMIA 07/03/2010   Qualifier: Diagnosis of  By: Arlyss Math     Unspecified mastoiditis     Past Surgical History:  Procedure Laterality Date   ANGIOPLASTY  07/02/09, 04/01/10   BILATERAL SALPINGOOPHORECTOMY     CAD( bare metal stent)  02/2009   x 1   CAROTID-SUBCLAVIAN BYPASS GRAFT Left 03/31/2018   Procedure: LEFT SUBCLAVIAN ARTERY BYPASS GRAFT;  Surgeon: Serene Gaile ORN, MD;  Location: [facility] OR;  Service: Vascular;  Laterality: Left;   CERVICAL SPINE SURGERY  08/08   COLONOSCOPY WITH PROPOFOL  N/A 04/29/2016   Procedure: COLONOSCOPY WITH PROPOFOL ;  Surgeon: Elspeth Deward Naval, MD;  Location: [facility];  Service: Gastroenterology;  Laterality: N/A;   ESOPHAGOGASTRODUODENOSCOPY N/A 12/09/2016   Procedure: ESOPHAGOGASTRODUODENOSCOPY (EGD);  Surgeon: Naval Elspeth Deward, MD;  Location: [facility];  Service: Gastroenterology;  Laterality: N/A;    ESOPHAGOGASTRODUODENOSCOPY (EGD) WITH PROPOFOL  N/A 04/29/2016   Procedure: ESOPHAGOGASTRODUODENOSCOPY (EGD) WITH PROPOFOL ;  Surgeon: Elspeth Deward Naval, MD;  Location: [facility];  Service: Gastroenterology;  Laterality: N/A;   EUS N/A 05/15/2016   Procedure: UPPER ENDOSCOPIC ULTRASOUND (EUS) RADIAL;  Surgeon: Toribio SHAUNNA Cedar, MD;  Location: [facility];  Service: Endoscopy;  Laterality: N/A;   ICD GENERATOR CHANGEOUT N/A 11/04/2022   Procedure: ICD GENERATOR CHANGEOUT;  Surgeon: Nancey, Eulas BRAVO, MD;  Location: [facility] INVASIVE CV LAB;  Service: Cardiovascular;  Laterality: N/A;   IMPLANTABLE CARDIOVERTER DEFIBRILLATOR IMPLANT N/A 08/05/2011   Primary prevention SJM ICD implanted,  Analyze ST study patient   INNER EAR SURGERY     left x 17   LUMBAR DISC SURGERY  02/2008   fusion   NASAL SEPTUM SURGERY  RIGHT HEART CATH N/A 12/11/2021   Procedure: RIGHT HEART CATH;  Surgeon: Wonda Sharper, MD;  Location: [facility] INVASIVE CV LAB;  Service: Cardiovascular;  Laterality: N/A;   THORACIC AORTIC ENDOVASCULAR STENT GRAFT N/A 03/31/2018   Procedure: THORACIC AORTIC ENDOVASCULAR STENT GRAFT;  Surgeon: Serene Gaile ORN, MD;  Location: [facility] OR;  Service: Vascular;  Laterality: N/A;   TOTAL ABDOMINAL HYSTERECTOMY     complete    Social History: Social History   Tobacco Use   Smoking status: Former    Current packs/day: 0.13    Average packs/day: 0.1 packs/day for 30.0 years (3.9 ttl pk-yrs)    Types: Cigarettes   Smokeless tobacco: Never  Vaping Use   Vaping status: Never Used  Substance Use Topics   Alcohol use: No    Alcohol/week: 0.0 standard drinks of alcohol   Drug use: No   Allergies  Allergen Reactions   Sulfa Antibiotics Other (See Comments)    Unknown, childhood allergy    Sulfonamide Derivatives Other (See Comments)    UNSURE   Zestril  [Lisinopril ] Cough    Family History  Problem Relation Age of Onset   Colon cancer Mother    Other Brother        tumor in his brain    Bladder Cancer Brother    Throat cancer Brother    Cancer Son        kidney, spread to his liver   Breast cancer Cousin      Prior to Admission medications   Medication Sig Start Date End Date Taking? Authorizing Provider  acetaminophen  (TYLENOL ) 500 MG tablet Take 2 tablets (1,000 mg total) by mouth every 6 (six) hours as needed (back pain.). 08/11/22   Redwine, Madison A, PA-C  amitriptyline  (ELAVIL ) 50 MG tablet TAKE 1 TABLET (50 MG TOTAL) BY MOUTH AT BEDTIME. 02/15/24   Purcell Emil Schanz, MD  aspirin  81 MG EC tablet Take 81 mg by mouth at bedtime.    [provider]  bisoprolol  (ZEBETA ) 5 MG tablet TAKE 1 TABLET BY MOUTH DAILY. SCHEDULE OFFICE VISIT FOR FURTHER REFILLS [phone] 02/15/24   Wonda Sharper, MD  ciprofloxacin  (CIPRO ) 250 MG tablet Take 1 tablet (250 mg total) by mouth 2 (two) times daily. 04/18/24   Plotnikov, Aleksei V, MD  diclofenac  Sodium (VOLTAREN  ARTHRITIS PAIN) 1 % GEL Apply 2 g topically 2 (two) times daily as needed (apply to low back  and/or hips for pain). 09/21/23   [name], DO  furosemide  (LASIX ) 40 MG tablet Take 1 tablet (40 mg total) by mouth daily. 12/09/21   Weaver, Scott T, PA-C  hydrALAZINE  (APRESOLINE ) 10 MG tablet TAKE 1 TABLET BY MOUTH 3 TIMES DAILY. KEEP UPCOMING APPOINTMENT FOR REFILLS 04/19/24   Wonda Sharper, MD  polyethylene glycol powder (GLYCOLAX /MIRALAX ) powder Take 17 g by mouth daily as needed for mild constipation. 07/08/17   Melonie Colonel, Mikel HERO, MD  rosuvastatin  (CRESTOR ) 20 MG tablet TAKE 1 TABLET (20 MG TOTAL) BY MOUTH AT BEDTIME. KEEP UPCOMING APPOINTMENT FOR REFILLS 04/13/24   Wonda Sharper, MD  STIOLTO RESPIMAT  2.5-2.5 MCG/ACT AERS INHALE 2 PUFFS INTO THE LUNGS DAILY. NEED APPOINTMENT FOR FURTHER REFILLS 02/13/23   Hunsucker, Donnice SAUNDERS, MD  topiramate  (TOPAMAX ) 50 MG tablet TAKE 1 TABLET (50 MG TOTAL) BY MOUTH DAILY. 11/17/23   Sagardia, Miguel Jose, MD  traMADol  (ULTRAM ) 50 MG tablet Take 1 tablet (50 mg total) by  mouth every 12 (twelve) hours as needed. Can use up to twice daily if  needed; do not exceed 30 tabs in 1 month 03/16/24   [name], DO    Physical Exam: Vitals:   05/12/24 1615 05/12/24 1630 05/12/24 1821 05/12/24 1945  BP: 136/69 126/69  (!) 140/69  Pulse: (!) 106 95  96  Resp: 18 18  17   Temp:    98.1 F (36.7 C)  TempSrc:    Oral  SpO2: 92% 96%  98%  Weight:   75 kg   Height:   5' 2 (1.575 m)    Constitutional: Elderly woman resting in bed with head elevated.  NAD, calm, comfortable Eyes: EOMI, lids and conjunctivae normal ENMT: Mucous membranes are moist. Posterior pharynx clear of any exudate or lesions.Normal dentition.  Neck: normal, supple, no masses. Respiratory: clear to auscultation bilaterally, no wheezing, no crackles. Normal respiratory effort. No accessory muscle use.  Cardiovascular: Regular rate and rhythm, no murmurs / rubs / gallops.  Trace bilateral lower extremity edema. 2+ pedal pulses. Abdomen: no tenderness, no masses palpated. Musculoskeletal: no clubbing / cyanosis. No joint deformity upper and lower extremities. Good ROM, no contractures. Normal muscle tone.  Skin: no rashes, lesions, ulcers. No induration Neurologic: Sensation intact. Strength 5/5 in all 4.  Psychiatric: Normal judgment and insight. Alert and oriented x 3. Normal mood.   EKG: Personally reviewed. Sinus tachycardia, rate 109, LBBB.  Rate is faster when compared to previous.  Assessment/Plan Principal Problem:   Chest pain Active Problems:   Essential hypertension   AICD (automatic cardioverter/defibrillator) present   Coronary artery disease involving native coronary artery of native heart with angina pectoris   COPD (chronic obstructive pulmonary disease) (HCC)   Dyslipidemia   HFrEF (heart failure with reduced ejection fraction) (HCC)   Stage 4 chronic kidney disease (HCC)   History of coronary artery stent placement   Hypertensive heart and kidney disease with HF and  with CKD stage IV (HCC)   Mixed hyperlipidemia   Chronic heart failure with reduced ejection fraction (HFrEF, <= 40%) (HCC)   Atherosclerotic heart disease   [name] is a 78 y.o. female with medical history significant for CAD s/p BMS to pLAD with multiple subsequent PCI, chronic HFrEF (EF 25-30% 2022) s/p AICD, history of PE s/p Coumadin course, CKD stage IV, COPD, HTN, HLD, thoracic aortic aneurysm s/p Lt carotid-subclavian transposition and endovascular repair 2019 who is admitted for evaluation of chest pain.  Assessment and Plan: Chest pain CAD s/p multiple PCI: Patient presenting with atypical chest pain.  Minimal troponin elevation 35 > 32.  EKG without significant acute ischemic changes. - Cardiology following - Echocardiogram ordered - Home bisoprolol  changed to Lopressor  25 mg PO BID per cardiology - Imdur  15 mg daily added - Cardiology planning on Lexiscan  stress test - Continue aspirin  and rosuvastatin   Chronic HFrEF s/p AICD: Last echocardiogram 2022 showed EF 25-35%.  Pro BNP 1606.  No pulmonary edema seen on imaging.  Patient does not appear grossly volume overloaded. - Cardiology following - Holding diuretics for now - Continue Lopressor   CKD stage IV: Creatinine slightly increased from previous.  Holding Lasix  as above, repeat labs in a.m.  Hypertension: Continue hydralazine , Lopressor , Imdur .  COPD: Stable, continue Stiolto Respimat  and albuterol  as needed.  Hyperlipidemia: Continue rosuvastatin .  Chronic Pain: Continue home tramadol .   DVT prophylaxis: heparin  injection 5,000 Units Start: 05/12/24 2200 Code Status:   Code Status: Limited: Do not attempt resuscitation (DNR) -DNR-LIMITED -Do Not Intubate/DNI discussed and confirmed with patient on admission. Family Communication: Discussed with  patient, she has discussed with family Disposition Plan: From home, dispo pending clinical progress Consults called: Cardiology Severity of Illness: The  appropriate patient status for this patient is OBSERVATION. Observation status is judged to be reasonable and necessary in order to provide the required intensity of service to ensure the patient's safety. The patient's presenting symptoms, physical exam findings, and initial radiographic and laboratory data in the context of their medical condition is felt to place them at decreased risk for further clinical deterioration. Furthermore, it is anticipated that the patient will be medically stable for discharge from the hospital within 2 midnights of admission.   Jorie Blanch MD [facility]  If 7PM-7AM, please contact night-coverage [email or web address]  05/12/2024, 10:56 PM

## 2024-05-12 NOTE — Plan of Care (Signed)
 Transfer from [facility] 78 year old female with [facility] HFrEF, CAD s/p prior stents last in 2010, CKD stage III and AAA who presents with complaints of substernal chest pain.  Vital signs stable.  Labs significant for proBNP 1606, high-sensitivity troponin 35->32.  Cardiology notified and plan on possible cath during this admission.  She had been given full dose aspirin  and 500 mL bolus of IV fluids.  Patient was accepted to a cardiac telemetry bed.

## 2024-05-12 NOTE — Consult Note (Addendum)
 Cardiology Consultation   [name] ID: ARYAA BUNTING MRN: [id number]; DOB: 05-02-1946  Admit date: 05/12/2024 Date of Consult: 05/12/2024  PCP:  Purcell Emil Schanz, MD   [facility] Providers Cardiologist:  Ozell Fell, MD  Electrophysiologist:  Eulas FORBES Furbish, MD    [name] Profile: [name] is a 78 y.o. female with a hx of CAD s/p BMS to proximal LAD, multiple subsequent PCI, chronic systolic CHF, status post AICD, history of PE, COPD, CKD, hypertension, hyperlipidemia, thoracic aortic aneurysm s/p L carotid-subclavian transposition and endovascular repair of aneurysm '19 who is being seen 05/12/2024 for the evaluation of chest pain at the request of Dr. Claudene.  History of Present Illness: [name] is a 78 year old female with past medical history noted above who was followed by Dr. Fell and Dr. Furbish as an outpatient.  As noted above bare-metal stent to LAD with multiple catheterizations and interventions to [name] LAD since that time.  Had a complicated course in 2011 with an anterior STEMI secondary to late stent thrombosis and was treated with balloon angioplasty and thrombectomy.  Course was complicated by pulmonary embolus and was placed on Coumadin at that time.  Echocardiogram at that time with LVEF of 30 to 35%, grade 1 diastolic dysfunction.  Underwent Myoview  04/2019 with large scar but no ischemia.  Last echocardiogram 04/2021 with LVEF of 25 to 30%, grade 1 diastolic dysfunction, severe akinesis of the mid to apical, anterior septal, apical segment and lateral wall with normal RV function.  Last seen in the office 03/2024 with Dr. Fell had reported overall doing well from a cardiac standpoint.  [name] was continued on hydralazine , bisoprolol  and furosemide .   Presented to [facility] with complaints of chest pain and weakness. Reports [name] got up this morning to do [name] hair and developed centralized chest soreness and had trouble raising [name] arms  up. Also was short of breath. Concerned [name] was brought to the ED  In the ED labs showed sodium 138, potassium 3.6, creatinine 2.26, proBNP 1606, high-sensitivity troponin 35>> 32, WBC 8.9, hemoglobin 12.8.  Underwent CT abdomen pelvis with well-seated endovascular aortic stent graft in the arch and proximal descending aorta with no endoleak. EKG showed sinus rhythm, 1 degree AVB with LBBB.   In talking with [name] [name] lives independently, but uses a walker to get around [name] home. Limited mobility, reports baseline shortness of breath that is mostly unchanged. Reports [name] periodically takes [name] diuretic but does not like it because it keeps [name] in the bathroom. Eats whatever [name] wants and does not watch [name] Na+ intake per se. Has not had anginal symptoms prior to today but again limited in [name] mobility.   Past Medical History:  Diagnosis Date   Abdominal pain, left lower quadrant 08/14/2009   Qualifier: Diagnosis of  By: Drucilla BANANA, Pam     Abnormal CT scan, stomach    Abnormal LFTs 06/19/2017   AICD (automatic cardioverter/defibrillator) present    Dr.Allred follows   Aortic arch pseudoaneurysm    a. followed by Dr. Fleeta Ochoa.   Arthritis    Asthma    Benign neoplasm of colon    CAD (coronary artery disease) 02/2009   a. anterior STEMI rx with BMS to prox LAD in 02/2009. b. ISR s/p PTCA 06/2009. c. ISR s/p thrombectomy & PTCA 03/2010 due to late stent thrombosis. // Myoview  04/2019: EF 28, ant, ant-sept, inf-sept scar, no ischemia, high risk (stable>>cont med Rx)    Cardiomyopathy, ischemic 06/12/2011  Chronic renal insufficiency    stage 3   Chronic systolic CHF (congestive heart failure) (HCC)    a. s/p ST. Jude ICD 05/28/2012.   Chronic systolic heart failure (HCC) 08/05/2011   Colon polyp    Complication of anesthesia    COPD (chronic obstructive pulmonary disease) (HCC)    CORONARY ARTERY DISEASE 04/24/2009   Qualifier: Diagnosis of  By: Ever Riggers, Amy S    DDD (degenerative  disc disease), lumbar 05/16/2011   Depression    since my son died in Nov 27, 2023 2018/05/28)   Diverticulosis    DIVERTICULOSIS-COLON 08/14/2009   Qualifier: Diagnosis of  By: Ever PA-c, Amy S    DYSPHAGIA UNSPECIFIED 04/24/2009   Qualifier: Diagnosis of  By: Drucilla BANANA, Pam     Dyspnea    when I lay down at night    Essential hypertension 09/05/2009   Qualifier: Diagnosis of  By: Thalia CMA, Jennifer     Gastric polyp    GERD 10/19/2007   Qualifier: Diagnosis of  By: Kimble RN, [place]: Diagnosis of  By: Drucilla BANANA, Pam     GERD (gastroesophageal reflux disease)    GI bleed    a. h/o GIB on DAPT, now on ASA only.   Headache 01/03/2013   Hearing loss    left ear   History of pulmonary embolus (PE) 01/02/2011   Hyperlipemia    Hyperlipidemia, unspecified 04/24/2009   Qualifier: Diagnosis of  By: Ever PA-c, Amy S    Hypertension    ICD-St.Jude 08/06/2011   S/p St. Jude ICD placement 08/05/11    Insomnia    Irritable bowel syndrome    Ischemic cardiomyopathy    EF 10-15%   Memory loss    Migraine headache without aura    Myocardial infarct (HCC) 2011 x 2   Dr. Cooper,cardiology    PERSONAL HX COLONIC POLYPS 04/24/2009   Qualifier: Diagnosis of  By: Ever Riggers, Amy S November, 2011 colonoscopy demonstrated a sessile cecal polyp and sigmoid polyp    Pneumonia    PONV (postoperative nausea and vomiting)    Pre-diabetes    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/05/2012   Pulmonary embolism (HCC) May 28, 2010   Stroke [facility])    'When I was young. no residual   Thoracic aortic aneurysm 03/31/2018   TOBACCO ABUSE 04/04/2009   Qualifier: Diagnosis of  By: Parthenia RIGGERS Olivia Mason  Qualifier: Diagnosis of  By: Morris, MD, CODY Debby Lenis    Transfusion history    ?'12 or '13   UNSPECIFIED ANEMIA 07/03/2010   Qualifier: Diagnosis of  By: Arlyss Math     Unspecified mastoiditis     Past Surgical History:  Procedure Laterality Date   ANGIOPLASTY  07/02/09, 04/01/10    BILATERAL SALPINGOOPHORECTOMY     CAD( bare metal stent)  02/2009   x 1   CAROTID-SUBCLAVIAN BYPASS GRAFT Left 03/31/2018   Procedure: LEFT SUBCLAVIAN ARTERY BYPASS GRAFT;  Surgeon: Serene Gaile ORN, MD;  Location: [facility] OR;  Service: Vascular;  Laterality: Left;   CERVICAL SPINE SURGERY  08/08   COLONOSCOPY WITH PROPOFOL  N/A 04/29/2016   Procedure: COLONOSCOPY WITH PROPOFOL ;  Surgeon: Elspeth Deward Naval, MD;  Location: [facility];  Service: Gastroenterology;  Laterality: N/A;   ESOPHAGOGASTRODUODENOSCOPY N/A 12/09/2016   Procedure: ESOPHAGOGASTRODUODENOSCOPY (EGD);  Surgeon: Naval Elspeth Deward, MD;  Location: [facility];  Service: Gastroenterology;  Laterality: N/A;   ESOPHAGOGASTRODUODENOSCOPY (EGD) WITH PROPOFOL  N/A 04/29/2016   Procedure: ESOPHAGOGASTRODUODENOSCOPY (EGD) WITH PROPOFOL ;  Surgeon:  Elspeth Deward Naval, MD;  Location: [facility];  Service: Gastroenterology;  Laterality: N/A;   EUS N/A 05/15/2016   Procedure: UPPER ENDOSCOPIC ULTRASOUND (EUS) RADIAL;  Surgeon: Toribio SHAUNNA Cedar, MD;  Location: [facility];  Service: Endoscopy;  Laterality: N/A;   ICD GENERATOR CHANGEOUT N/A 11/04/2022   Procedure: ICD GENERATOR CHANGEOUT;  Surgeon: Nancey, Eulas BRAVO, MD;  Location: [facility] INVASIVE CV LAB;  Service: Cardiovascular;  Laterality: N/A;   IMPLANTABLE CARDIOVERTER DEFIBRILLATOR IMPLANT N/A 08/05/2011   Primary prevention SJM ICD implanted,  Analyze ST study [name]   INNER EAR SURGERY     left x 17   LUMBAR DISC SURGERY  02/2008   fusion   NASAL SEPTUM SURGERY     RIGHT HEART CATH N/A 12/11/2021   Procedure: RIGHT HEART CATH;  Surgeon: Wonda Sharper, MD;  Location: [facility] INVASIVE CV LAB;  Service: Cardiovascular;  Laterality: N/A;   THORACIC AORTIC ENDOVASCULAR STENT GRAFT N/A 03/31/2018   Procedure: THORACIC AORTIC ENDOVASCULAR STENT GRAFT;  Surgeon: Serene Gaile ORN, MD;  Location: [facility] OR;  Service: Vascular;  Laterality: N/A;   TOTAL ABDOMINAL HYSTERECTOMY     complete     Scheduled Meds:  Continuous Infusions:  PRN Meds:   Allergies:    Allergies  Allergen Reactions   Sulfa Antibiotics Other (See Comments)    Unknown, childhood allergy    Sulfonamide Derivatives Other (See Comments)    UNSURE   Zestril  [Lisinopril ] Cough    Social History:   Social History   Socioeconomic History   Marital status: Divorced    Spouse name: Not on file   Number of children: 2   Years of education: 11   Highest education level: 12th grade  Occupational History   Occupation: Designer, industrial/product: UNEMPLOYED    Employer: DISABLED  Tobacco Use   Smoking status: Former    Current packs/day: 0.13    Average packs/day: 0.1 packs/day for 30.0 years (3.9 ttl pk-yrs)    Types: Cigarettes   Smokeless tobacco: Never  Vaping Use   Vaping status: Never Used  Substance and Sexual Activity   Alcohol use: No    Alcohol/week: 0.0 standard drinks of alcohol   Drug use: No   Sexual activity: Not Currently  Other Topics Concern   Not on file  Social History Narrative   Pt lives in [place] alone. 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).[name] has 11 th grade education.Right handed.Caffeine - one cup daily   Social Drivers of Health   Financial Resource Strain: Low Risk  (02/23/2024)   Overall Financial Resource Strain (CARDIA)    Difficulty of Paying Living Expenses: Not hard at all  Food Insecurity: No Food Insecurity (02/23/2024)   Hunger Vital Sign    Worried About Running Out of Food in the Last Year: Never true    Ran Out of Food in the Last Year: Never true  Transportation Needs: No Transportation Needs (02/23/2024)   PRAPARE - Administrator, Civil Service (Medical): No    Lack of Transportation (Non-Medical): No  Physical Activity: Inactive (02/23/2024)   Exercise Vital Sign    Days of Exercise per Week: 0 days    Minutes of Exercise per Session: 0 min  Stress: No Stress Concern Present (02/23/2024)   [employer]  of Occupational Health - Occupational Stress Questionnaire    Feeling of Stress: Not at all  Social Connections: Moderately Integrated (02/23/2024)   Social Connection and Isolation Panel  Frequency of Communication with Friends and Family: Once a week    Frequency of Social Gatherings with Friends and Family: More than three times a week    Attends Religious Services: More than 4 times per year    Active Member of [employer] or Organizations: Yes    Attends Banker Meetings: Never    Marital Status: Divorced  Catering manager Violence: [name] Unable To Answer (02/23/2024)   Humiliation, Afraid, Rape, and Kick questionnaire    Fear of Current or Ex-Partner: [name] unable to answer    Emotionally Abused: [name] unable to answer    Physically Abused: [name] unable to answer    Sexually Abused: [name] unable to answer    Family History:    Family History  Problem Relation Age of Onset   Colon cancer Mother    Other Brother        tumor in his brain   Bladder Cancer Brother    Throat cancer Brother    Cancer Son        kidney, spread to his liver   Breast cancer Cousin      ROS:  Review of Systems  Constitutional: Positive for malaise/fatigue.  Cardiovascular:  Positive for chest pain and dyspnea on exertion. Negative for claudication, irregular heartbeat, leg swelling, near-syncope, orthopnea, palpitations, paroxysmal nocturnal dyspnea and syncope.  Respiratory:  Negative for shortness of breath.   Hematologic/Lymphatic: Negative for bleeding problem.       Physical Exam/Data: Vitals:   05/12/24 1430 05/12/24 1530 05/12/24 1615 05/12/24 1630  BP: (!) 142/82 (!) 160/89 136/69 126/69  Pulse: 95 (!) 102 (!) 106 95  Resp: (!) 21 19 18 18   Temp:      TempSrc:      SpO2: 96% 97% 92% 96%   No intake or output data in the 24 hours ending 05/12/24 1752    04/18/2024   11:23 AM 04/15/2024   11:27 AM 03/16/2024    2:29 PM  Last 3 Weights  Weight (lbs) 168 lb 9.6  oz 171 lb 12.8 oz 171 lb 3.2 oz  Weight (kg) 76.476 kg 77.928 kg 77.656 kg     There is no height or weight on file to calculate BMI.  General:  Well nourished, well developed, in no acute distress HEENT: normal Neck: no JVD Vascular: L carotid bruit; Distal pulses 2+ bilaterally Cardiac:  normal S1, S2; RRR; no murmur  Lungs:  clear to auscultation bilaterally Abd: soft, nontender, no hepatomegaly  Ext: trace bilateral LE edema Musculoskeletal:  No deformities, BUE and BLE strength normal and equal Skin: warm and dry  Neuro:   no focal abnormalities noted Psych:  Normal affect   EKG:  The EKG was personally reviewed and demonstrates:  Sinus Rhythm, LBBB Telemetry:  Telemetry was personally reviewed and demonstrates:  Sinus Rhythm, LBBB rates 90-100s  Relevant CV Studies:  Echo: 04/2021  IMPRESSIONS     1. Definity  contrast does not show any evidence of LV thrombus.      . Left ventricular ejection fraction, by estimation, is 25 to 30%. The  left ventricle has severely decreased function. The left ventricle  demonstrates regional wall motion abnormalities (see scoring  diagram/findings for description). The left  ventricular internal cavity size was moderately dilated. Left ventricular  diastolic parameters are consistent with Grade I diastolic dysfunction  (impaired relaxation). There is severe akinesis of the left ventricular,  mid-apical anteroseptal wall, apical   segment and lateral wall.   2. Right  ventricular systolic function is normal. The right ventricular  size is normal.   3. Right atrial size was mildly dilated.   4. The mitral valve is normal in structure. No evidence of mitral valve  regurgitation.   5. The aortic valve is normal in structure. Aortic valve regurgitation is  not visualized.   FINDINGS   Left Ventricle: Definity  contrast does not show any evidence of LV  thrombus.  Left ventricular ejection fraction, by estimation, is 25 to 30%. The left   ventricle has severely decreased function. The left ventricle demonstrates  regional wall motion abnormalities. Severe akinesis of the left  ventricular, mid-apical anteroseptal  wall, apical segment and lateral wall. Definity  contrast agent was given  IV to delineate the left ventricular endocardial borders. The left  ventricular internal cavity size was moderately dilated. There is no left  ventricular hypertrophy. Left  ventricular diastolic parameters are consistent with Grade I diastolic  dysfunction (impaired relaxation).   Right Ventricle: The right ventricular size is normal. Right vetricular  wall thickness was not well visualized. Right ventricular systolic  function is normal.   Left Atrium: Left atrial size was normal in size.   Right Atrium: Right atrial size was mildly dilated.   Pericardium: There is no evidence of pericardial effusion.   Mitral Valve: The mitral valve is normal in structure. No evidence of  mitral valve regurgitation.   Tricuspid Valve: The tricuspid valve is grossly normal. Tricuspid valve  regurgitation is not demonstrated.   Aortic Valve: The aortic valve is normal in structure. Aortic valve  regurgitation is not visualized.   Pulmonic Valve: The pulmonic valve was normal in structure. Pulmonic valve  regurgitation is not visualized.   Aorta: The aortic root and ascending aorta are structurally normal, with  no evidence of dilitation.   IAS/Shunts: The atrial septum is grossly normal.    Laboratory Data: High Sensitivity Troponin:  No results for input(s): TROPONINIHS in the last 720 hours.   Chemistry Recent Labs  Lab 05/12/24 0843  NA 138  K 3.6  CL 105  CO2 18*  GLUCOSE 134*  BUN 20  CREATININE 2.26*  CALCIUM  9.6  GFRNONAA 22*  ANIONGAP 15    No results for input(s): PROT, ALBUMIN , AST, ALT, ALKPHOS, BILITOT in the last 168 hours. Lipids No results for input(s): CHOL, TRIG, HDL, LABVLDL, LDLCALC,  CHOLHDL in the last 168 hours.  Hematology Recent Labs  Lab 05/12/24 0843  WBC 8.9  RBC 3.81*  HGB 12.8  HCT 37.9  MCV 99.5  MCH 33.6  MCHC 33.8  RDW 15.4  PLT 208   Thyroid  No results for input(s): TSH, FREET4 in the last 168 hours.  BNP Recent Labs  Lab 05/12/24 0843  PROBNP 1,606.0*    DDimer No results for input(s): DDIMER in the last 168 hours.  Radiology/Studies:  CT Angio Chest/Abd/Pel for Dissection W and/or W/WO Result Date: 05/12/2024 CLINICAL DATA:  Acute aortic syndrome (AAS) suspected EXAM: CT ANGIOGRAPHY CHEST, ABDOMEN AND PELVIS TECHNIQUE: Non-contrast CT of the chest was initially obtained. Multidetector CT imaging through the chest, abdomen and pelvis was performed using the standard protocol during bolus administration of intravenous contrast. Multiplanar reconstructed images and MIPs were obtained and reviewed to evaluate the vascular anatomy. RADIATION DOSE REDUCTION: This exam was performed according to the departmental dose-optimization program which includes automated exposure control, adjustment of the mA and/or kV according to [name] size and/or use of iterative reconstruction technique. CONTRAST:  80mL OMNIPAQUE  IOHEXOL  350 MG/ML  SOLN COMPARISON:  05/01/2022, 07/06/2019 FINDINGS: CTA CHEST FINDINGS Pulmonary Embolism: No pulmonary embolism. Cardiovascular: Mild cardiomegaly with multi-vessel coronary atherosclerosis. No pericardial effusion. No aortic aneurysm, intramural hematoma, or aortic dissection. Well seated endovascular aortic stent graft in the arch and proximal descending aorta. No periaortic fluid collection. Left chest pacemaker/AICD with a single lead terminating in the right ventricle. Mediastinum/Nodes: No mediastinal mass. No mediastinal, hilar, or axillary lymphadenopathy. Lungs/Pleura: The midline trachea and bronchi are patent. Posterior bibasilar dependent atelectasis. No focal airspace consolidation, pleural effusion, or pneumothorax.  Review of the MIP images confirms the above findings. CTA ABDOMEN AND PELVIS FINDINGS VASCULAR Aorta: No aortic aneurysm or aortic dissection. Diffuse aortic atherosclerosis. No hemodynamically significant stenosis. Celiac: Patent without acute thrombus, aneurysm, or dissection. No hemodynamically significant stenosis. SMA: Patent without acute thrombus, aneurysm, or dissection. No hemodynamically significant stenosis. Renals: Moderate to severe narrowing of both renal artery ostia from mixed density plaque. No renal artery aneurysm or dissection. IMA: Moderate stenosis of the IMA ostium from calcified plaque. No aneurysm or acute thrombosis. Inflow: Patent without acute thrombus, aneurysm, or dissection. No hemodynamically significant stenosis. Proximal Outflow: The bilateral common femoral and visualized portions of the superficial and profunda femoral arteries are patent without acute thrombus, aneurysm, or dissection. No hemodynamically significant stenosis. Veins: Nondiagnostic evaluation of the veins due to arterial timing of the contrast bolus. Review of the MIP images confirms the above findings. NON-VASCULAR Hepatobiliary: Unchanged small hypodensity in the hepatic dome, likely a small cyst or biliary hamartoma.No radiopaque stones or wall thickening of the gallbladder. No intrahepatic or extrahepatic biliary ductal dilation. Pancreas: No mass or main ductal dilation.No peripancreatic inflammation or fluid collection. Spleen: Normal size. No mass. Adrenals/Urinary Tract: No adrenal masses. No renal mass. Renal cortical atrophy noted in both kidneys. No hydronephrosis or nephrolithiasis. The urinary bladder is completely decompressed. Stomach/Bowel: The stomach is decompressed without focal abnormality. No small bowel wall thickening or inflammation. No small bowel obstruction.Normal appendix. Descending and sigmoid colonic diverticulosis. No changes of acute diverticulitis. Lymphatic: No intraabdominal or  pelvic lymphadenopathy. Reproductive: Hysterectomy. No concerning adnexal mass. No free pelvic fluid. Other: No pneumoperitoneum, ascites, or mesenteric inflammation. Musculoskeletal: No acute fracture or destructive lesion.Multilevel degenerative disc disease of the spine. Diffuse osteopenia. L4 through S1 lumbosacral fusion hardware. Laminectomy changes at these levels. Review of the MIP images confirms the above findings. IMPRESSION: 1. Similarly well seated endovascular aortic stent graft in the arch and proximal descending aorta. No findings to suggest endoleak. No aortic aneurysm, intramural hematoma, or aortic dissection. 2. No pneumonia, pulmonary edema, or pleural effusion. No acute abnormality within the abdomen or pelvis. Aortic Atherosclerosis (ICD10-I70.0). Electronically Signed   By: Rogelia Myers M.D.   On: 05/12/2024 10:17     Assessment and Plan:  [name] is a 78 y.o. female with a hx of CAD s/p BMS to proximal LAD, multiple subsequent PCI, chronic systolic CHF, status post AICD, history of PE, COPD, CKD, hypertension, hyperlipidemia, thoracic aortic aneurysm s/p L carotid-subclavian transposition and endovascular repair of aneurysm '19 who is being seen 05/12/2024 for the evaluation of chest pain at the request of Dr. Claudene.  Chest pain CAD s/p prior BMS to LAD -- presented with centralized chest soreness and weakness after getting up to get ready this morning -- hsTn 35>>32. EKG with chronic LBBB -- received 2 baby ASA in the ED with resolution of symptoms -- [name] mobility is limited at baseline, therefore difficult to assess anginal symptoms PTA -- baseline renal  disease with Cr around 2, received [facility] therefore would like to avoid additional contrast  -- will plan for lexiscan  myoview  tomorrow -- check echo   Chronic systolic HF s/p AICD  -- known LVEF 25-30% -- does not take [name] lasix  daily, only PRN -- elevated ProBNP, but does not appear volume  overloaded on exam -- hold lasix  for now, switch bisoprolol  to metoprolol  25mg  BID as [name] HR is elevated   HTN -- initially elevated, now improved -- as above, will start metoprolol  25mg  BID   Thoracic aortic aneurysm s/p L carotid-subclavian transposition and endovascular repair of aneurysm '19 -- stable on CT today  CKD IV -- baseline Cr around 2, GFR 22. As above, would like to avoid additional contrast if possible  For questions or updates, please contact [facility] Please consult [email or web address] for contact info under   Signed, Manuelita Rummer, NP  05/12/2024 5:52 PM  ADDENDUM:   [name] seen and examined with Manuelita Rummer, NP.  I personally taken a history, examined the [name], reviewed relevant notes,  laboratory data / imaging studies.  I performed a substantive portion of this encounter and formulated the important aspects of the plan.  I agree with the APP's note, impression, and recommendations; however, I have edited the note to reflect changes or salient points.   [name] with a complex past medical history as outlined above presents to the hospital with chief complaint of weakness and precordial soreness.  [name] had 1 episode of precordial soreness, substernally located, not brought on by effort related activities, does not resolve with rest.  Overall functional capacity is limited as [name] ambulates with a walker.  When [name] was last seen by [name] primary cardiologist in September 2025 [name] was overall doing well.  [name] endorses noncompliance with medical therapy specifically diuretics due to increased diuresis and inconvenience as well as dietary indiscretion of high salty foods.  [name] is resting in bed comfortably and denies anginal chest pain or precordial discomfort at this time.  Cardiology was consulted during this hospitalization for evaluation of chest pain given [name] complex history  PHYSICAL EXAM: Today's Vitals   05/12/24 1615 05/12/24 1630  05/12/24 1633 05/12/24 1821  BP: 136/69 126/69    Pulse: (!) 106 95    Resp: 18 18    Temp:      TempSrc:      SpO2: 92% 96%    Weight:    75 kg  Height:    5' 2 (1.575 m)  PainSc:   0-No pain 0-No pain   Body mass index is 30.24 kg/m.   Net IO Since Admission: 0 mL [05/12/24 1916]  Filed Weights   05/12/24 1821  Weight: 75 kg    Physical Exam  Constitutional: No distress. [name] appears chronically ill.  hemodynamically stable, ambulates with a walker  Neck: No JVD present.  Cardiovascular: Regular rhythm, S1 normal and S2 normal. Tachycardia present. Exam reveals no gallop, no S3 and no S4.  No murmur heard. Pulses:      Carotid pulses are  on the left side with bruit. Pulmonary/Chest: Effort normal. No stridor. [name] has no wheezes. [name] has bibasilar rales.  AICD device in situ  Musculoskeletal:        General: No edema.     Cervical back: Neck supple.  Skin: Skin is warm.    EKG: (personally reviewed by me) 05/12/2024: Sinus tachycardia, 109 bpm, left axis, IVCD/left bundle branch block  Telemetry: (personally reviewed  by me) Sinus tachycardia   Impression & Recommendations: :  Precordial pain Coronary artery disease status post PCI, history of myocardial infarction. Ischemic cardiomyopathy. Chronic heart failure with reduced EF AICD device in situ Hypertension with chronic kidney disease stage IV. Hyperlipidemia. History of thoracic aortic aneurysm status post left carotid/subclavian transposition and endovascular repair of aneurysm Noncompliance  Cardiology consulted for evaluation of precordial pain given [name] complex past medical history. [name] symptoms are nonsuggestive anginal discomfort or ACS - will hold IV heparin .  However [name] has multiple risk factors, prior coronary interventions, ischemic cardiomyopathy, and noncompliance that predisposes [name] to disease progression.    Clinically [name] denies anginal chest pain or heart failure symptoms. On physical  examination no significant JVP or lower extremity swelling.  [name] extremities are warm to touch. Low likelihood for cardiogenic shock.  [name] asking about undergoing a left heart catheterization to evaluate [name] coronary anatomy and disease burden.  Had a long discussion with the [name] and [name] son Vinie over the phone with regards to [name] presentation, risk factors, and laboratory values.  My clinical concern is that given [name] chronic kidney disease stage IV and [name] has already received at least 80 cc of contrast with [name] CT scan today [name] is at high likelihood of developing CIN in the next 48 hours.  Would like to preserve renal function and decrease contrast exposure.  To address [name] concerns with regards to CAD as well as to preserve renal function would recommend noninvasive workup in a [name] with precordial pain not entirely cardiac etiology.  [name] has not had a recent echo or stress test.  Recommend echocardiogram with Definity  as well as Lexiscan  stress test to evaluate for reversible ischemia.  Given [name] history of myocardial infarction [name] is going to have fixed perfusion defect and likely a high risk scan.  The goal is to evaluate for reversible ischemia.  Based on clinical trajectory further recommendations to follow.  In the interim reemphasized importance of medication compliance, low-salt diet, and regular weight check.  Would like to increase antianginal therapy.  [name] is on bisoprolol  5 mg p.o. daily, will transition [name] to Lopressor  25 mg p.o. twice daily with goals of transitioning [name] to Toprol -XL closer to discharge.  Will add Imdur  15 mg p.o. daily.  Telemetry personally reviewed, sinus tachycardia.  [name] is not in overt heart failure and therefore we will hold off on diuretics due to concerns for renal function.  Would like to check renal function tomorrow morning and reevaluate need for diuresis.  Further recommendations to follow  Medical Decision:  Complexity:  High Interdisciplinary: Yes Independently reviewed: Dr. Margurite last progress note 04/15/2024, ED documentation earlier this morning, labs 05/12/2024, echocardiogram report October 2022,CTA chest abdomen pelvis results 05/12/2024 Prescription drug management -recommendations stated above Family updated: Yes, spoke to [name] son.  Further recommendations to follow as the case evolves.   This note was created using a voice recognition software as a result there may be grammatical errors inadvertently enclosed that do not reflect the nature of this encounter. Every attempt is made to correct such errors.   Madonna Michele HAS, [facility]  A Division of [facility] [address] [place], [place] [address]  Pager: [phone] Office: [phone] 05/12/2024 7:16 PM

## 2024-05-12 NOTE — ED Notes (Signed)
[name] with cl called for transport 

## 2024-05-12 NOTE — Telephone Encounter (Signed)
 FYI Only or Action Required?: FYI only for provider.: nurse informed pt to go to ED now: pt verbalized understanding  Patient was last seen in primary care on 04/18/2024 by Plotnikov, Karlynn GAILS, MD.  Called Nurse Triage reporting Chest Pain.  Symptoms began today.  Interventions attempted: Nothing.  Symptoms are: unchanged.  Triage Disposition: Go to ED Now (Notify PCP)  Patient/caregiver understands and will follow disposition?: Yes    Copied from CRM [id number]. Topic: Clinical - Red Word Triage >> May 12, 2024  7:45 AM Franky GRADE wrote: Red Word that prompted transfer to Nurse Triage: Patient is experiencing soreness in her chest, and fatigue. Reason for Disposition  [1] Chest pain (or angina) comes and goes AND [2] is happening more often (increasing in frequency) or getting worse (increasing in severity)  (Exception: Chest pains that last only a few seconds.)  Answer Assessment - Initial Assessment Questions 1. LOCATION: Where does it hurt?       Chest soreness between breasts 2. RADIATION: Does the pain go anywhere else? (e.g., into neck, jaw, arms, back)     Chest  3. ONSET: When did the chest pain begin? (Minutes, hours or days)      Today chest soreness & jaw hurting yesterday & none now 4. PATTERN: Does the pain come and go, or has it been constant since it started?  Does it get worse with exertion?      constant 5. DURATION: How long does it last (e.g., seconds, minutes, hours)     constant 6. SEVERITY: How bad is the pain?  (e.g., Scale 1-10; mild, moderate, or severe)     6/10 7. CARDIAC RISK FACTORS: Do you have any history of heart problems or risk factors for heart disease? (e.g., angina, prior heart attack; diabetes, high blood pressure, high cholesterol, smoker, or strong family history of heart disease)     na 8. PULMONARY RISK FACTORS: Do you have any history of lung disease?  (e.g., blood clots in lung, asthma, emphysema, birth control  pills)     na 9. CAUSE: What do you think is causing the chest pain?     unknown 10. OTHER SYMPTOMS: Do you have any other symptoms? (e.g., dizziness, nausea, vomiting, sweating, fever, difficulty breathing, cough)       Burning pain in abd & better right now, nausea, lightheaded, SOB started 11. PREGNANCY: Is there any chance you are pregnant? When was your last menstrual period?       Na  Pt stated jaw was hurting yesterday, chest soreness 6/10 started this morning. Pt c/o nausea, abd discomfort, SOB.  Referred pt to ED now.  Protocols used: Chest Pain-A-AH

## 2024-05-12 NOTE — ED Provider Notes (Addendum)
 [place] [facility] AT [facility] Provider Note  CSN: [id number] Arrival date & time: 05/12/24 9163  Chief Complaint(s) Chest Pain  HPI [name] is a 78 y.o. female here today for sudden onset pain in the middle of her chest.  Patient with significant cardiac history, including heart failure, 2 stents, thoracic aortic aneurysm.  Patient also reports that she had been taking an antibiotic for a kidney infection 2 weeks ago.  She had been having some pain in her abdomen the last few days but states that this is improved.  Patient was doing her hair this morning when she began to experience pain in her chest and feel weak.   Past Medical History Past Medical History:  Diagnosis Date   Abdominal pain, left lower quadrant 08/14/2009   Qualifier: Diagnosis of  By: Drucilla BANANA, Pam     Abnormal CT scan, stomach    Abnormal LFTs 06/19/2017   AICD (automatic cardioverter/defibrillator) present    Dr.Allred follows   Aortic arch pseudoaneurysm    a. followed by Dr. Fleeta Ochoa.   Arthritis    Asthma    Benign neoplasm of colon    CAD (coronary artery disease) 02/2009   a. anterior STEMI rx with BMS to prox LAD in 02/2009. b. ISR s/p PTCA 06/2009. c. ISR s/p thrombectomy & PTCA 03/2010 due to late stent thrombosis. // Myoview  06-06-19: EF 28, ant, ant-sept, inf-sept scar, no ischemia, high risk (stable>>cont med Rx)    Cardiomyopathy, ischemic 06/12/2011   Chronic renal insufficiency    stage 3   Chronic systolic CHF (congestive heart failure) (HCC)    a. s/p ST. Jude ICD 06/05/2012.   Chronic systolic heart failure (HCC) 08/05/2011   Colon polyp    Complication of anesthesia    COPD (chronic obstructive pulmonary disease) (HCC)    CORONARY ARTERY DISEASE 04/24/2009   Qualifier: Diagnosis of  By: Ever Riggers, Amy S    DDD (degenerative disc disease), lumbar 05/16/2011   Depression    since my son died in 12/05/2023 06-05-18)   Diverticulosis    DIVERTICULOSIS-COLON  08/14/2009   Qualifier: Diagnosis of  By: Ever PA-c, Amy S    DYSPHAGIA UNSPECIFIED 04/24/2009   Qualifier: Diagnosis of  By: Drucilla BANANA, Pam     Dyspnea    when I lay down at night    Essential hypertension 09/05/2009   Qualifier: Diagnosis of  By: Thalia CMA, Jennifer     Gastric polyp    GERD 10/19/2007   Qualifier: Diagnosis of  By: Kimble RN, Santa Dan: Diagnosis of  By: Drucilla BANANA, Pam     GERD (gastroesophageal reflux disease)    GI bleed    a. h/o GIB on DAPT, now on ASA only.   Headache 01/03/2013   Hearing loss    left ear   History of pulmonary embolus (PE) 01/02/2011   Hyperlipemia    Hyperlipidemia, unspecified 04/24/2009   Qualifier: Diagnosis of  By: Ever PA-c, Amy S    Hypertension    ICD-St.Jude 08/06/2011   S/p St. Jude ICD placement 08/05/11    Insomnia    Irritable bowel syndrome    Ischemic cardiomyopathy    EF 10-15%   Memory loss    Migraine headache without aura    Myocardial infarct [facility]) 2011 x 2   Dr. 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 no residual   Thoracic aortic aneurysm 03/31/2018   TOBACCO ABUSE 04/04/2009   Qualifier: Diagnosis of  By: Parthenia DEVONNA Olivia Mason  Qualifier: Diagnosis of  By: Morris, MD, CODY Debby Lenis    Transfusion history    ?'12 or '13   UNSPECIFIED ANEMIA 07/03/2010   Qualifier: Diagnosis of  By: Arlyss Math     Unspecified mastoiditis    Patient Active Problem List   Diagnosis Date Noted   S/P cervical spinal fusion 03/16/2024   Cervicalgia 03/16/2024   Granulation of postmastoidectomy cavity of left side 01/03/2024   Left-sided tinnitus 01/03/2024    Myofascial pain 08/10/2023   Chronic pain syndrome 05/20/2023   Encounter for long-term opiate analgesic use 05/20/2023   Constipation 05/12/2023   Family history of colon cancer in mother 05/12/2023   Bilateral hip pain 04/16/2023   Stage 4 chronic kidney disease (HCC) 11/25/2022   UTI due to Klebsiella species 05/08/2022   Chronic bilateral low back pain without sciatica 05/08/2022   Spinal stenosis of lumbar region without neurogenic claudication 05/08/2022   Otalgia of left ear 03/19/2022   HFrEF (heart failure with reduced ejection fraction) (HCC) 12/02/2021   Stage 3b chronic kidney disease (HCC) 08/08/2021   Chronic insomnia 11/14/2019   Colon polyp 06/16/2018   Thoracic aortic aneurysm without rupture 03/31/2018   Chronic renal insufficiency    AICD (automatic cardioverter/defibrillator) present    Ischemic cardiomyopathy    COPD (chronic obstructive pulmonary disease) (HCC)    Dyslipidemia    Insomnia    Pre-diabetes 06/19/2017   Gastric polyp    Osteoporosis 05/11/2013   Hearing loss 04/29/2013   Mild cognitive impairment 04/29/2013   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/05/2012   Implantable cardioverter-defibrillator (ICD) in situ 08/06/2011   DDD (degenerative disc disease), lumbar 05/16/2011   History of pulmonary embolus (PE) 01/02/2011   UNSPECIFIED ANEMIA 07/03/2010   Essential hypertension 09/05/2009   DIVERTICULOSIS-COLON 08/14/2009   History of colonic polyps 04/24/2009   TOBACCO ABUSE 04/04/2009   Coronary artery disease involving native coronary artery of native heart with angina pectoris 02/25/2009   GERD 10/19/2007   IBS 10/19/2007   COLONIC POLYPS, ADENOMATOUS 05/12/2007   Home Medication(s) Prior to Admission medications   Medication Sig Start Date End Date Taking? Authorizing Provider  acetaminophen  (TYLENOL ) 500 MG tablet Take 2 tablets (1,000 mg total) by mouth every 6 (six) hours as needed (back pain.). 08/11/22   Redwine, Madison A, PA-C   amitriptyline  (ELAVIL ) 50 MG tablet TAKE 1 TABLET (50 MG TOTAL) BY MOUTH AT BEDTIME. 02/15/24   Purcell Emil Schanz, MD  aspirin  81 MG EC tablet Take 81 mg by mouth at bedtime.    [provider]  bisoprolol  (ZEBETA ) 5 MG tablet TAKE 1 TABLET BY MOUTH DAILY. SCHEDULE OFFICE VISIT FOR FURTHER REFILLS [phone] 02/15/24   Wonda Sharper, MD  ciprofloxacin  (CIPRO ) 250 MG tablet Take 1 tablet (250 mg total) by mouth 2 (two) times daily. 04/18/24   Plotnikov, Aleksei V, MD  diclofenac  Sodium (VOLTAREN  ARTHRITIS PAIN) 1 % GEL Apply 2 g topically 2 (two) times daily as needed (apply to low back  and/or hips for pain). 09/21/23   [name], DO  furosemide  (LASIX ) 40 MG tablet Take 1 tablet (40 mg total) by mouth daily. 12/09/21   Weaver, Scott T, PA-C  hydrALAZINE  (APRESOLINE ) 10 MG tablet TAKE 1 TABLET BY MOUTH 3 TIMES DAILY. KEEP UPCOMING APPOINTMENT FOR REFILLS 04/19/24   Wonda Sharper, MD  polyethylene glycol powder (GLYCOLAX /MIRALAX ) powder Take 17 g by mouth daily as needed for mild constipation. 07/08/17   Melonie Colonel, Mikel HERO, MD  rosuvastatin  (CRESTOR ) 20 MG tablet TAKE 1 TABLET (20 MG TOTAL) BY MOUTH AT BEDTIME. KEEP UPCOMING APPOINTMENT FOR REFILLS 04/13/24   Wonda Sharper, MD  STIOLTO RESPIMAT  2.5-2.5 MCG/ACT AERS INHALE 2 PUFFS INTO THE LUNGS DAILY. NEED APPOINTMENT FOR FURTHER REFILLS 02/13/23   Hunsucker, Donnice SAUNDERS, MD  topiramate  (TOPAMAX ) 50 MG tablet TAKE 1 TABLET (50 MG TOTAL) BY MOUTH DAILY. 11/17/23   Sagardia, Miguel Jose, MD  traMADol  (ULTRAM ) 50 MG tablet Take 1 tablet (50 mg total) by mouth every 12 (twelve) hours as needed. Can use up to twice daily if needed; do not exceed 30 tabs in 1 month 03/16/24   [name], DO                                                                                                                                    Past Surgical History Past Surgical History:  Procedure Laterality Date   ANGIOPLASTY  07/02/09, 04/01/10    BILATERAL SALPINGOOPHORECTOMY     CAD( bare metal stent)  02/2009   x 1   CAROTID-SUBCLAVIAN BYPASS GRAFT Left 03/31/2018   Procedure: LEFT SUBCLAVIAN ARTERY BYPASS GRAFT;  Surgeon: Serene Gaile ORN, MD;  Location: [facility] OR;  Service: Vascular;  Laterality: Left;   CERVICAL SPINE SURGERY  08/08   COLONOSCOPY WITH PROPOFOL  N/A 04/29/2016   Procedure: COLONOSCOPY WITH PROPOFOL ;  Surgeon: Elspeth Deward Naval, MD;  Location: [facility];  Service: Gastroenterology;  Laterality: N/A;   ESOPHAGOGASTRODUODENOSCOPY N/A 12/09/2016   Procedure: ESOPHAGOGASTRODUODENOSCOPY (EGD);  Surgeon: Naval Elspeth Deward, MD;  Location: [facility];  Service: Gastroenterology;  Laterality: N/A;   ESOPHAGOGASTRODUODENOSCOPY (EGD) WITH PROPOFOL  N/A 04/29/2016   Procedure: ESOPHAGOGASTRODUODENOSCOPY (EGD) WITH PROPOFOL ;  Surgeon: Elspeth Deward Naval, MD;  Location: [facility];  Service: Gastroenterology;  Laterality: N/A;   EUS N/A 05/15/2016   Procedure: UPPER ENDOSCOPIC ULTRASOUND (EUS) RADIAL;  Surgeon: Toribio SHAUNNA Cedar, MD;  Location: [facility];  Service: Endoscopy;  Laterality: N/A;   ICD GENERATOR CHANGEOUT N/A 11/04/2022   Procedure: ICD GENERATOR CHANGEOUT;  Surgeon: Nancey, Eulas BRAVO, MD;  Location: [facility] INVASIVE CV LAB;  Service: Cardiovascular;  Laterality: N/A;   IMPLANTABLE CARDIOVERTER DEFIBRILLATOR IMPLANT N/A 08/05/2011   Primary prevention SJM ICD implanted,  Analyze ST study patient   INNER EAR SURGERY     left x 17   LUMBAR DISC SURGERY  02/2008   fusion   NASAL SEPTUM SURGERY     RIGHT HEART CATH N/A 12/11/2021   Procedure: RIGHT HEART CATH;  Surgeon: Wonda Sharper, MD;  Location: [facility] INVASIVE CV LAB;  Service: Cardiovascular;  Laterality: N/A;   THORACIC AORTIC ENDOVASCULAR STENT GRAFT N/A 03/31/2018   Procedure: THORACIC AORTIC ENDOVASCULAR STENT GRAFT;  Surgeon: Serene Gaile ORN, MD;  Location: [facility] OR;  Service: Vascular;  Laterality: N/A;   TOTAL ABDOMINAL HYSTERECTOMY     complete    Family History Family History  Problem Relation Age of Onset   Colon cancer Mother    Other Brother        tumor in his brain   Bladder Cancer Brother    Throat cancer Brother    Cancer Son        kidney, spread to his liver   Breast cancer Cousin     Social History Social History   Tobacco Use   Smoking status: Former    Current packs/day: 0.13    Average packs/day: 0.1 packs/day for 30.0 years (3.9 ttl pk-yrs)    Types: Cigarettes   Smokeless tobacco: Never  Vaping Use   Vaping status: Never Used  Substance Use Topics   Alcohol use: No    Alcohol/week: 0.0 standard drinks of alcohol   Drug use: No   Allergies Sulfa antibiotics, Sulfonamide derivatives, and Zestril  [lisinopril ]  Review of Systems Review of Systems  Physical Exam Vital Signs  I have reviewed the triage vital signs BP 137/79   Pulse 100   Temp 98.2 F (36.8 C) (Oral)   Resp 16   SpO2 97%   Physical Exam Vitals and nursing note reviewed.  Constitutional:      Appearance: She is ill-appearing.  Cardiovascular:     Rate and Rhythm: Tachycardia present.     Pulses:          Posterior tibial pulses are 2+ on the right side and 2+ on the left side.     Heart sounds: Normal heart sounds.  Pulmonary:     Breath sounds: Normal breath sounds.  Chest:     Chest wall: No mass or tenderness.  Musculoskeletal:        General: Normal range of motion.  Skin:    General: Skin is warm.  Neurological:     Mental Status: She is alert.     ED Results and Treatments Labs (all labs ordered are listed, but only abnormal results are displayed) Labs Reviewed  BASIC METABOLIC PANEL WITH GFR - Abnormal; Notable for the following components:      Result Value   CO2 18 (*)    Glucose, Bld 134 (*)    Creatinine, Ser 2.26 (*)    GFR, Estimated 22 (*)    All other components within normal limits  CBC - Abnormal; Notable for the following components:   RBC 3.81 (*)    nRBC 0.3 (*)    All other  components within normal limits  URINALYSIS, W/ REFLEX TO CULTURE (INFECTION SUSPECTED) - Abnormal; Notable for the following components:   Specific Gravity, Urine >1.046 (*)    Protein, ur TRACE (*)    All other components within normal limits  PRO BRAIN NATRIURETIC PEPTIDE - Abnormal; Notable for the following components:   Pro Brain Natriuretic Peptide 1,606.0 (*)    All other components within normal limits  TROPONIN T, HIGH SENSITIVITY - Abnormal; Notable for the following components:   Troponin T High Sensitivity 35 (*)    All other components within normal limits  TROPONIN T, HIGH SENSITIVITY - Abnormal; Notable for the following components:   Troponin  T High Sensitivity 32 (*)    All other components within normal limits  RESP PANEL BY RT-PCR (RSV, FLU A&B, COVID)  RVPGX2                                                                                                                          Radiology CT Angio Chest/Abd/Pel for Dissection W and/or W/WO Result Date: 05/12/2024 CLINICAL DATA:  Acute aortic syndrome (AAS) suspected EXAM: CT ANGIOGRAPHY CHEST, ABDOMEN AND PELVIS TECHNIQUE: Non-contrast CT of the chest was initially obtained. Multidetector CT imaging through the chest, abdomen and pelvis was performed using the standard protocol during bolus administration of intravenous contrast. Multiplanar reconstructed images and MIPs were obtained and reviewed to evaluate the vascular anatomy. RADIATION DOSE REDUCTION: This exam was performed according to the departmental dose-optimization program which includes automated exposure control, adjustment of the mA and/or kV according to patient size and/or use of iterative reconstruction technique. CONTRAST:  80mL OMNIPAQUE  IOHEXOL  350 MG/ML SOLN COMPARISON:  05/01/2022, 07/06/2019 FINDINGS: CTA CHEST FINDINGS Pulmonary Embolism: No pulmonary embolism. Cardiovascular: Mild cardiomegaly with multi-vessel coronary atherosclerosis. No pericardial  effusion. No aortic aneurysm, intramural hematoma, or aortic dissection. Well seated endovascular aortic stent graft in the arch and proximal descending aorta. No periaortic fluid collection. Left chest pacemaker/AICD with a single lead terminating in the right ventricle. Mediastinum/Nodes: No mediastinal mass. No mediastinal, hilar, or axillary lymphadenopathy. Lungs/Pleura: The midline trachea and bronchi are patent. Posterior bibasilar dependent atelectasis. No focal airspace consolidation, pleural effusion, or pneumothorax. Review of the MIP images confirms the above findings. CTA ABDOMEN AND PELVIS FINDINGS VASCULAR Aorta: No aortic aneurysm or aortic dissection. Diffuse aortic atherosclerosis. No hemodynamically significant stenosis. Celiac: Patent without acute thrombus, aneurysm, or dissection. No hemodynamically significant stenosis. SMA: Patent without acute thrombus, aneurysm, or dissection. No hemodynamically significant stenosis. Renals: Moderate to severe narrowing of both renal artery ostia from mixed density plaque. No renal artery aneurysm or dissection. IMA: Moderate stenosis of the IMA ostium from calcified plaque. No aneurysm or acute thrombosis. Inflow: Patent without acute thrombus, aneurysm, or dissection. No hemodynamically significant stenosis. Proximal Outflow: The bilateral common femoral and visualized portions of the superficial and profunda femoral arteries are patent without acute thrombus, aneurysm, or dissection. No hemodynamically significant stenosis. Veins: Nondiagnostic evaluation of the veins due to arterial timing of the contrast bolus. Review of the MIP images confirms the above findings. NON-VASCULAR Hepatobiliary: Unchanged small hypodensity in the hepatic dome, likely a small cyst or biliary hamartoma.No radiopaque stones or wall thickening of the gallbladder. No intrahepatic or extrahepatic biliary ductal dilation. Pancreas: No mass or main ductal dilation.No  peripancreatic inflammation or fluid collection. Spleen: Normal size. No mass. Adrenals/Urinary Tract: No adrenal masses. No renal mass. Renal cortical atrophy noted in both kidneys. No hydronephrosis or nephrolithiasis. The urinary bladder is completely decompressed. Stomach/Bowel: The stomach is decompressed without focal abnormality. No small bowel wall thickening or inflammation. No small bowel obstruction.Normal appendix. Descending and sigmoid colonic diverticulosis.  No changes of acute diverticulitis. Lymphatic: No intraabdominal or pelvic lymphadenopathy. Reproductive: Hysterectomy. No concerning adnexal mass. No free pelvic fluid. Other: No pneumoperitoneum, ascites, or mesenteric inflammation. Musculoskeletal: No acute fracture or destructive lesion.Multilevel degenerative disc disease of the spine. Diffuse osteopenia. L4 through S1 lumbosacral fusion hardware. Laminectomy changes at these levels. Review of the MIP images confirms the above findings. IMPRESSION: 1. Similarly well seated endovascular aortic stent graft in the arch and proximal descending aorta. No findings to suggest endoleak. No aortic aneurysm, intramural hematoma, or aortic dissection. 2. No pneumonia, pulmonary edema, or pleural effusion. No acute abnormality within the abdomen or pelvis. Aortic Atherosclerosis (ICD10-I70.0). Electronically Signed   By: Rogelia Myers M.D.   On: 05/12/2024 10:17    Pertinent labs & imaging results that were available during my care of the patient were reviewed by me and considered in my medical decision making (see MDM for details).  Medications Ordered in ED Medications  iohexol  (OMNIPAQUE ) 350 MG/ML injection 80 mL (80 mLs Intravenous Contrast Given 05/12/24 0919)  aspirin  chewable tablet 324 mg (324 mg Oral Given 05/12/24 1050)  sodium chloride  0.9 % bolus 500 mL (500 mLs Intravenous New Bag/Given 05/12/24 1049)                                                                                                                                      Procedures Procedures  (including critical care time)  Medical Decision Making / ED Course   This patient presents to the ED for concern of chest pain, this involves an extensive number of treatment options, and is a complaint that carries with it a high risk of complications and morbidity.  The differential diagnosis includes ACS, aortic dissection, aortic aneurysm, consider pulmonary embolism.  MDM: With the patient's extensive cardiac history, do have concern for ACS.  However patient with a known thoracic aneurysm.  Believe it is imperative to rule out aortic pathology prior to providing the patient with aspirin  or potentially anticoagulation.  Her EKG shows an IVCD, it is consistent with her prior EKG from September.  Patient does not meet STEMI criteria at this time although that may change.  Patient will require IV contrast despite her low GFR.  Ultimately patient will require admission.  Reassessment 10:40 AM-patient's CTA negative for dissection.  Aspirin  ordered.  Initial troponin 35.  BNP elevated at 1600.  Still having a bit of discomfort in her chest but states her pain is improved.  Reassessment 11:45 AM-patient's troponin is flat.  Given her risk factors, heart score of 5, believe she requires admission.  Spoke with cardiology who agreed with admission.  Admitted to hospitalist service.   Additional history obtained: -Additional history obtained from  -External records from outside source obtained and reviewed including: Chart review including previous notes, labs, imaging, consultation notes   Lab Tests: -I ordered, reviewed, and interpreted labs.   The pertinent results include:  Labs Reviewed  BASIC METABOLIC PANEL WITH GFR - Abnormal; Notable for the following components:      Result Value   CO2 18 (*)    Glucose, Bld 134 (*)    Creatinine, Ser 2.26 (*)    GFR, Estimated 22 (*)    All other components  within normal limits  CBC - Abnormal; Notable for the following components:   RBC 3.81 (*)    nRBC 0.3 (*)    All other components within normal limits  URINALYSIS, W/ REFLEX TO CULTURE (INFECTION SUSPECTED) - Abnormal; Notable for the following components:   Specific Gravity, Urine >1.046 (*)    Protein, ur TRACE (*)    All other components within normal limits  PRO BRAIN NATRIURETIC PEPTIDE - Abnormal; Notable for the following components:   Pro Brain Natriuretic Peptide 1,606.0 (*)    All other components within normal limits  TROPONIN T, HIGH SENSITIVITY - Abnormal; Notable for the following components:   Troponin T High Sensitivity 35 (*)    All other components within normal limits  TROPONIN T, HIGH SENSITIVITY - Abnormal; Notable for the following components:   Troponin T High Sensitivity 32 (*)    All other components within normal limits  RESP PANEL BY RT-PCR (RSV, FLU A&B, COVID)  [id number]      EKG IVCD without acute changes compared to prior.  EKG Interpretation Date/Time:    Ventricular Rate:    PR Interval:    QRS Duration:    QT Interval:    QTC Calculation:   R Axis:      Text Interpretation:           Imaging Studies ordered: I ordered imaging studies including  I independently visualized and interpreted imaging. I agree with the radiologist interpretation   Medicines ordered and prescription drug management: Meds ordered this encounter  Medications   iohexol  (OMNIPAQUE ) 350 MG/ML injection 80 mL   aspirin  chewable tablet 324 mg   sodium chloride  0.9 % bolus 500 mL    -I have reviewed the patients home medicines and have made adjustments as needed  Critical interventions   Cardiac Monitoring: The patient was maintained on a cardiac monitor.  I personally viewed and interpreted the cardiac monitored which showed an underlying rhythm of: Normal sinus rhythm  Social Determinants of Health:  Factors impacting patients care include:     Reevaluation: After the interventions noted above, I reevaluated the patient and found that they have :  Co morbidities that complicate the patient evaluation  Past Medical History:  Diagnosis Date   Abdominal pain, left lower quadrant 08/14/2009   Qualifier: Diagnosis of  By: Drucilla BANANA, Pam     Abnormal CT scan, stomach    Abnormal LFTs 06/19/2017   AICD (automatic cardioverter/defibrillator) present    Dr.Allred follows   Aortic arch pseudoaneurysm    a. followed by Dr. Fleeta Ochoa.   Arthritis    Asthma    Benign neoplasm of colon    CAD (coronary artery disease) 02/2009   a. anterior STEMI rx with BMS to prox LAD in 02/2009. b. ISR s/p PTCA 06/2009. c. ISR s/p thrombectomy & PTCA 03/2010 due to late stent thrombosis. // Myoview  04/2019: EF 28, ant, ant-sept, inf-sept scar, no ischemia, high risk (stable>>cont med Rx)    Cardiomyopathy, ischemic 06/12/2011   Chronic renal insufficiency    stage 3   Chronic systolic CHF (congestive heart failure) (HCC)    a. s/p ST. Jude ICD 2013.  Chronic systolic heart failure (HCC) 08/05/2011   Colon polyp    Complication of anesthesia    COPD (chronic obstructive pulmonary disease) (HCC)    CORONARY ARTERY DISEASE 04/24/2009   Qualifier: Diagnosis of  By: Ever Riggers, Amy S    DDD (degenerative disc disease), lumbar 05/16/2011   Depression    since my son died in Dec 09, 2023 06-09-18)   Diverticulosis    DIVERTICULOSIS-COLON 08/14/2009   Qualifier: Diagnosis of  By: Ever PA-c, Amy S    DYSPHAGIA UNSPECIFIED 04/24/2009   Qualifier: Diagnosis of  By: Drucilla BANANA, Pam     Dyspnea    when I lay down at night    Essential hypertension 09/05/2009   Qualifier: Diagnosis of  By: Thalia CMA, Jennifer     Gastric polyp    GERD 10/19/2007   Qualifier: Diagnosis of  By: Kimble RN, [place] Dan: Diagnosis of  By: Drucilla BANANA, Pam     GERD (gastroesophageal reflux disease)    GI bleed    a. h/o GIB on DAPT, now on ASA only.    Headache 01/03/2013   Hearing loss    left ear   History of pulmonary embolus (PE) 01/02/2011   Hyperlipemia    Hyperlipidemia, unspecified 04/24/2009   Qualifier: Diagnosis of  By: Ever PA-c, Amy S    Hypertension    ICD-St.Jude 08/06/2011   S/p St. Jude ICD placement 08/05/11    Insomnia    Irritable bowel syndrome    Ischemic cardiomyopathy    EF 10-15%   Memory loss    Migraine headache without aura    Myocardial infarct (HCC) 2011 x 2   Dr. 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 no residual   Thoracic aortic aneurysm 03/31/2018   TOBACCO ABUSE 04/04/2009   Qualifier: Diagnosis of  By: Parthenia RIGGERS Olivia Mason  Qualifier: Diagnosis of  By: Morris, MD, CODY Debby Lenis    Transfusion history    ?'12 or '13   UNSPECIFIED ANEMIA 07/03/2010   Qualifier: Diagnosis of  By: Arlyss Math     Unspecified mastoiditis       Dispostion: I considered admission for this patient,      Final Clinical Impression(s) / ED Diagnoses Final diagnoses:  Chest pain, unspecified type     @PCDICTATION @    Mannie Pac T, DO 05/12/24 0901    Mannie Pac T, DO 05/12/24 1146

## 2024-05-13 ENCOUNTER — Observation Stay (HOSPITAL_COMMUNITY)

## 2024-05-13 ENCOUNTER — Other Ambulatory Visit (HOSPITAL_COMMUNITY): Payer: Self-pay

## 2024-05-13 ENCOUNTER — Other Ambulatory Visit: Payer: Self-pay | Admitting: Physician Assistant

## 2024-05-13 DIAGNOSIS — R072 Precordial pain: Secondary | ICD-10-CM

## 2024-05-13 DIAGNOSIS — I5022 Chronic systolic (congestive) heart failure: Secondary | ICD-10-CM | POA: Diagnosis not present

## 2024-05-13 DIAGNOSIS — I25119 Atherosclerotic heart disease of native coronary artery with unspecified angina pectoris: Secondary | ICD-10-CM | POA: Diagnosis not present

## 2024-05-13 DIAGNOSIS — I509 Heart failure, unspecified: Secondary | ICD-10-CM

## 2024-05-13 DIAGNOSIS — R079 Chest pain, unspecified: Secondary | ICD-10-CM | POA: Diagnosis not present

## 2024-05-13 DIAGNOSIS — I10 Essential (primary) hypertension: Secondary | ICD-10-CM

## 2024-05-13 DIAGNOSIS — Z95810 Presence of automatic (implantable) cardiac defibrillator: Secondary | ICD-10-CM | POA: Diagnosis not present

## 2024-05-13 LAB — ECHOCARDIOGRAM COMPLETE
Height: 62 in
S' Lateral: 4.6 cm
Weight: 2656.1 [oz_av]

## 2024-05-13 LAB — GLUCOSE, CAPILLARY: Glucose-Capillary: 120 mg/dL — ABNORMAL HIGH (ref 70–99)

## 2024-05-13 LAB — CBC
HCT: 34.7 % — ABNORMAL LOW (ref 36.0–46.0)
Hemoglobin: 11.2 g/dL — ABNORMAL LOW (ref 12.0–15.0)
MCH: 32.9 pg (ref 26.0–34.0)
MCHC: 32.3 g/dL (ref 30.0–36.0)
MCV: 102.1 fL — ABNORMAL HIGH (ref 80.0–100.0)
Platelets: 187 K/uL (ref 150–400)
RBC: 3.4 MIL/uL — ABNORMAL LOW (ref 3.87–5.11)
RDW: 15.5 % (ref 11.5–15.5)
WBC: 7.1 K/uL (ref 4.0–10.5)
nRBC: 0.3 % — ABNORMAL HIGH (ref 0.0–0.2)

## 2024-05-13 LAB — BASIC METABOLIC PANEL WITH GFR
Anion gap: 8 (ref 5–15)
BUN: 25 mg/dL — ABNORMAL HIGH (ref 8–23)
CO2: 19 mmol/L — ABNORMAL LOW (ref 22–32)
Calcium: 8.6 mg/dL — ABNORMAL LOW (ref 8.9–10.3)
Chloride: 112 mmol/L — ABNORMAL HIGH (ref 98–111)
Creatinine, Ser: 2.15 mg/dL — ABNORMAL HIGH (ref 0.44–1.00)
GFR, Estimated: 23 mL/min — ABNORMAL LOW (ref >60)
Glucose, Bld: 111 mg/dL — ABNORMAL HIGH (ref 70–99)
Potassium: 3.7 mmol/L (ref 3.5–5.1)
Sodium: 139 mmol/L (ref 135–145)

## 2024-05-13 MED ORDER — PERFLUTREN LIPID MICROSPHERE
1.0000 mL | INTRAVENOUS | Status: AC | PRN
  Administered 2024-05-13: 2 mL via INTRAVENOUS

## 2024-05-13 MED ORDER — ISOSORBIDE MONONITRATE ER 30 MG PO TB24
15.0000 mg | ORAL_TABLET | Freq: Every day | ORAL | 0 refills | Status: DC
  Filled 2024-05-13: qty 15, 30d supply, fill #0

## 2024-05-13 MED ORDER — METOPROLOL TARTRATE 25 MG PO TABS
25.0000 mg | ORAL_TABLET | Freq: Two times a day (BID) | ORAL | 0 refills | Status: DC
  Filled 2024-05-13: qty 60, 30d supply, fill #0

## 2024-05-13 MED ORDER — NITROGLYCERIN 0.4 MG SL SUBL
0.4000 mg | SUBLINGUAL_TABLET | SUBLINGUAL | 0 refills | Status: AC | PRN
  Filled 2024-05-13: qty 25, 8d supply, fill #0

## 2024-05-13 NOTE — TOC CM/SW Note (Signed)
 Transition of Care [facility]) - Inpatient Brief Assessment   Patient Details  Name: [name] MRN: [id number] Date of Birth: 19-Aug-1945  [facility]) CM/SW Contact:    Waddell Barnie Rama, RN Phone Number: 05/13/2024, 4:32 PM   Clinical Narrative: From home alone,indep, has PCP and insurance on file, states has no HH services in place at this time or DME at home.  States family member  (son) will transport them home at [employer] and family is support system, states gets medications from [facility].  Pta self ambulatory.   Per PT eval rec HHPT,  NCM offered choice, patient states she does not want HHPT at this time.      Transition of Care Asessment: Insurance and Status: Insurance coverage has been reviewed Patient has primary care physician: Yes Home environment has been reviewed: return home Prior level of function:: indep Prior/Current Home Services: No current home services Social Drivers of Health Review: SDOH reviewed no interventions necessary Readmission risk has been reviewed: Yes Transition of care needs: no transition of care needs at this time

## 2024-05-13 NOTE — Progress Notes (Signed)
  Echocardiogram 2D Echocardiogram has been performed.  Tinnie FORBES Gosling RDCS 05/13/2024, 11:17 AM

## 2024-05-13 NOTE — Addendum Note (Signed)
 Addended by: Jacquez Sheetz N on: 05/13/2024 02:02 PM   Modules accepted: Orders

## 2024-05-13 NOTE — Progress Notes (Signed)
 Mobility Specialist Progress Note:    05/13/24 1424  Mobility  Activity Ambulated with assistance  Level of Assistance Contact guard assist, steadying assist  Assistive Device Other (Comment) (HHA)  Distance Ambulated (ft) 25 ft  Activity Response Tolerated fair  Mobility Referral Yes  Mobility visit 1 Mobility  Mobility Specialist Start Time (ACUTE ONLY) 1424  Mobility Specialist Stop Time (ACUTE ONLY) 1432  Mobility Specialist Time Calculation (min) (ACUTE ONLY) 8 min   Received pt laying in bed pleasant and agreeable to session. No c/o any symptoms. Pt able to move and ambulate on her own but seemed to get SOB fairly quickly. One instance of LOB but self corrected. Returned pt to bed w/ all needs met.   Venetia Keel Mobility Specialist Please Neurosurgeon or Rehab Office at [phone]

## 2024-05-13 NOTE — Progress Notes (Addendum)
 Awaiting final echo read, but have scheduled nuclear stress test (no precert needed per review with office) and follow-up appointment, details outlined on AVS with stress test instructions as well. [id number] order also placed.  Informed Consent   Shared Decision Making/Informed Consent The risks [chest pain, shortness of breath, cardiac arrhythmias, dizziness, blood pressure fluctuations, myocardial infarction, stroke/transient ischemic attack, nausea, vomiting, allergic reaction, radiation exposure, metallic taste sensation and life-threatening complications (estimated to be 1 in 10,000)], benefits (risk stratification, diagnosing coronary artery disease, treatment guidance) and alternatives of a nuclear stress test were discussed in detail with [name] and she agrees to proceed.      Addendum: echo c/w prior result, d/w Dr Swaziland. Continue plan for OP nuc and follow-up. Relayed result to patient. Also informed her that stress test instructions are outlined on AVS including no Imdur  day of test. Will send update to Dr. Cindy.

## 2024-05-13 NOTE — Care Management Obs Status (Signed)
 MEDICARE OBSERVATION STATUS NOTIFICATION   Patient Details  Name: [name] MRN: [id number] Date of Birth: 12/16/1945   Medicare Observation Status Notification Given:  Yes    [name] 05/13/2024, 8:50 AM

## 2024-05-13 NOTE — Discharge Instructions (Addendum)
   You have a Stress Test scheduled at [facility]. Your doctor has ordered this test to check the blood flow in your heart arteries.  Please arrive 15 minutes early for paperwork. The whole test will take several hours. You may want to bring reading material to remain occupied while undergoing different parts of the test.  Instructions: No food/drink after midnight the day of your test. It is OK to take your morning meds with a sip of water EXCEPT for those types of medicines listed below or otherwise instructed. No caffeine /decaf products 24 hours before, including medicines such as Excedrin or Goody Powders. Call if there are any questions.  Wear comfortable clothes and shoes.   Special Medication Instructions: Do not take Imdur /isosorbide  the day of your test.  What To Expect: When you arrive in the lab, the technician will inject a small amount of radioactive tracer into your arm through an IV while you are resting quietly. This helps us  to form pictures of your heart. You will likely only feel a sting from the IV. After a waiting period, resting pictures will be obtained under a big camera. These are the before pictures.  Next, you will be prepped for the stress portion of the test. This may include either walking on a treadmill or receiving a medicine that helps to dilate blood vessels in your heart to simulate the effect of exercise on your heart. If you are walking on a treadmill, you will walk at different paces to try to get your heart rate to a goal number that is based on your age. If your doctor has chosen the pharmacologic test, then you will receive a medicine through your IV that may cause temporary nausea, flushing, shortness of breath and sometimes chest discomfort or vomiting. This is typically short-lived and usually resolves quickly. If you experience symptoms, that does not automatically mean the test is abnormal. Some patients do not experience any symptoms at  all. Your blood pressure and heart rate will be monitored, and we will be watching your EKG on a computer screen for any changes. During this portion of the test, the radiologist will inject another small amount of radioactive tracer into your IV. After a waiting period, you will undergo a second set of pictures. These are the after pictures.  The doctor reading the test will compare the before-and-after images to look for evidence of heart blockages or heart weakness. The test usually takes 1 day to complete, but in certain instances (for example, if a patient is over a certain weight limit), the test may be done over the span of 2 days.

## 2024-05-13 NOTE — TOC Transition Note (Signed)
 Transition of Care [facility]) - Discharge Note   Patient Details  Name: [name] MRN: [id number] Date of Birth: March 09, 1946  Transition of Care [facility]) CM/SW Contact:  Waddell Barnie Rama, RN Phone Number: 05/13/2024, 4:35 PM   Clinical Narrative:    For dc home, she has transportation at dc.         Patient Goals and CMS Choice            Discharge Placement                       Discharge Plan and Services Additional resources added to the After Visit Summary for                                       Social Drivers of Health (SDOH) Interventions SDOH Screenings   Food Insecurity: No Food Insecurity (05/12/2024)  Housing: Low Risk  (05/12/2024)  Transportation Needs: No Transportation Needs (05/12/2024)  Utilities: Not At Risk (05/12/2024)  Alcohol Screen: Low Risk  (02/23/2024)  Depression (PHQ2-9): Low Risk  (03/16/2024)  Recent Concern: Depression (PHQ2-9) - Medium Risk (12/23/2023)  Financial Resource Strain: Low Risk  (02/23/2024)  Physical Activity: Inactive (02/23/2024)  Social Connections: Moderately Integrated (05/12/2024)  Stress: No Stress Concern Present (02/23/2024)  Tobacco Use: Medium Risk (05/12/2024)  Health Literacy: Inadequate Health Literacy (02/23/2024)     Readmission Risk Interventions     No data to display

## 2024-05-13 NOTE — Progress Notes (Signed)
 Outpatient stress test order per Dr Swaziland

## 2024-05-13 NOTE — Progress Notes (Addendum)
 Rounding Note   Patient Name: [name] Date of Encounter: 05/13/2024  [facility] Cardiologist: Ozell Fell, MD   Subjective No recurrent chest pain since admit. Breathing at baseline. Patient reports she got up to do her hair yesterday and just became weak. Had some soreness in right parasternal region. Reports chest pain resolved with ASA  Scheduled Meds:  amitriptyline   50 mg Oral QHS   arformoterol  15 mcg Nebulization BID   And   umeclidinium bromide  1 puff Inhalation Daily   aspirin  EC  81 mg Oral QHS   heparin   5,000 Units Subcutaneous Q8H   hydrALAZINE   10 mg Oral Q8H   isosorbide  mononitrate  15 mg Oral Daily   metoprolol  tartrate  25 mg Oral BID   rosuvastatin   20 mg Oral QHS   sodium chloride  flush  3 mL Intravenous Q12H   Continuous Infusions:  PRN Meds: acetaminophen  **OR** acetaminophen , albuterol , bisacodyl , nitroGLYCERIN , ondansetron  **OR** ondansetron  (ZOFRAN ) IV, senna-docusate, traMADol    Vital Signs  Vitals:   05/12/24 2348 05/13/24 0400 05/13/24 0509 05/13/24 0749  BP: 108/62 116/65  124/74  Pulse: 73 82  86  Resp: 18 16  18   Temp: 98 F (36.7 C) 97.9 F (36.6 C)  98 F (36.7 C)  TempSrc: Oral Oral  Oral  SpO2: 97% 96%  99%  Weight:   75.3 kg   Height:        Intake/Output Summary (Last 24 hours) at 05/13/2024 0859 Last data filed at 05/12/2024 1800 Gross per 24 hour  Intake 0 ml  Output --  Net 0 ml      05/13/2024    5:09 AM 05/12/2024    6:21 PM 04/18/2024   11:23 AM  Last 3 Weights  Weight (lbs) 166 lb 0.1 oz 165 lb 5.5 oz 168 lb 9.6 oz  Weight (kg) 75.3 kg 75 kg 76.476 kg      Telemetry NSR - Personally Reviewed  ECG  NSR with PACs. LBBB - Personally Reviewed  Physical Exam  GEN: obese, chronically ill appearing.    Neck: No JVD, left carotid/subclavian bruit Cardiac: RRR, no murmurs, rubs, or gallops.  Respiratory: Clear to auscultation bilaterally. GI: Soft, nontender, non-distended  MS: No  edema; No deformity. Neuro:  Nonfocal  Psych: Normal affect   Labs High Sensitivity Troponin:  No results for input(s): TROPONINIHS in the last 720 hours.   Chemistry Recent Labs  Lab 05/12/24 0843 05/13/24 0255  NA 138 139  K 3.6 3.7  CL 105 112*  CO2 18* 19*  GLUCOSE 134* 111*  BUN 20 25*  CREATININE 2.26* 2.15*  CALCIUM  9.6 8.6*  GFRNONAA 22* 23*  ANIONGAP 15 8    Lipids No results for input(s): CHOL, TRIG, HDL, LABVLDL, LDLCALC, CHOLHDL in the last 168 hours.  Hematology Recent Labs  Lab 05/12/24 0843 05/13/24 0255  WBC 8.9 7.1  RBC 3.81* 3.40*  HGB 12.8 11.2*  HCT 37.9 34.7*  MCV 99.5 102.1*  MCH 33.6 32.9  MCHC 33.8 32.3  RDW 15.4 15.5  PLT 208 187   Thyroid  No results for input(s): TSH, FREET4 in the last 168 hours.  BNP Recent Labs  Lab 05/12/24 0843  PROBNP 1,606.0*    DDimer No results for input(s): DDIMER in the last 168 hours.   Radiology  CT Angio Chest/Abd/Pel for Dissection W and/or W/WO Result Date: 05/12/2024 CLINICAL DATA:  Acute aortic syndrome (AAS) suspected EXAM: CT ANGIOGRAPHY CHEST, ABDOMEN AND PELVIS TECHNIQUE: Non-contrast  CT of the chest was initially obtained. Multidetector CT imaging through the chest, abdomen and pelvis was performed using the standard protocol during bolus administration of intravenous contrast. Multiplanar reconstructed images and MIPs were obtained and reviewed to evaluate the vascular anatomy. RADIATION DOSE REDUCTION: This exam was performed according to the departmental dose-optimization program which includes automated exposure control, adjustment of the mA and/or kV according to patient size and/or use of iterative reconstruction technique. CONTRAST:  80mL OMNIPAQUE  IOHEXOL  350 MG/ML SOLN COMPARISON:  05/01/2022, 07/06/2019 FINDINGS: CTA CHEST FINDINGS Pulmonary Embolism: No pulmonary embolism. Cardiovascular: Mild cardiomegaly with multi-vessel coronary atherosclerosis. No pericardial  effusion. No aortic aneurysm, intramural hematoma, or aortic dissection. Well seated endovascular aortic stent graft in the arch and proximal descending aorta. No periaortic fluid collection. Left chest pacemaker/AICD with a single lead terminating in the right ventricle. Mediastinum/Nodes: No mediastinal mass. No mediastinal, hilar, or axillary lymphadenopathy. Lungs/Pleura: The midline trachea and bronchi are patent. Posterior bibasilar dependent atelectasis. No focal airspace consolidation, pleural effusion, or pneumothorax. Review of the MIP images confirms the above findings. CTA ABDOMEN AND PELVIS FINDINGS VASCULAR Aorta: No aortic aneurysm or aortic dissection. Diffuse aortic atherosclerosis. No hemodynamically significant stenosis. Celiac: Patent without acute thrombus, aneurysm, or dissection. No hemodynamically significant stenosis. SMA: Patent without acute thrombus, aneurysm, or dissection. No hemodynamically significant stenosis. Renals: Moderate to severe narrowing of both renal artery ostia from mixed density plaque. No renal artery aneurysm or dissection. IMA: Moderate stenosis of the IMA ostium from calcified plaque. No aneurysm or acute thrombosis. Inflow: Patent without acute thrombus, aneurysm, or dissection. No hemodynamically significant stenosis. Proximal Outflow: The bilateral common femoral and visualized portions of the superficial and profunda femoral arteries are patent without acute thrombus, aneurysm, or dissection. No hemodynamically significant stenosis. Veins: Nondiagnostic evaluation of the veins due to arterial timing of the contrast bolus. Review of the MIP images confirms the above findings. NON-VASCULAR Hepatobiliary: Unchanged small hypodensity in the hepatic dome, likely a small cyst or biliary hamartoma.No radiopaque stones or wall thickening of the gallbladder. No intrahepatic or extrahepatic biliary ductal dilation. Pancreas: No mass or main ductal dilation.No  peripancreatic inflammation or fluid collection. Spleen: Normal size. No mass. Adrenals/Urinary Tract: No adrenal masses. No renal mass. Renal cortical atrophy noted in both kidneys. No hydronephrosis or nephrolithiasis. The urinary bladder is completely decompressed. Stomach/Bowel: The stomach is decompressed without focal abnormality. No small bowel wall thickening or inflammation. No small bowel obstruction.Normal appendix. Descending and sigmoid colonic diverticulosis. No changes of acute diverticulitis. Lymphatic: No intraabdominal or pelvic lymphadenopathy. Reproductive: Hysterectomy. No concerning adnexal mass. No free pelvic fluid. Other: No pneumoperitoneum, ascites, or mesenteric inflammation. Musculoskeletal: No acute fracture or destructive lesion.Multilevel degenerative disc disease of the spine. Diffuse osteopenia. L4 through S1 lumbosacral fusion hardware. Laminectomy changes at these levels. Review of the MIP images confirms the above findings. IMPRESSION: 1. Similarly well seated endovascular aortic stent graft in the arch and proximal descending aorta. No findings to suggest endoleak. No aortic aneurysm, intramural hematoma, or aortic dissection. 2. No pneumonia, pulmonary edema, or pleural effusion. No acute abnormality within the abdomen or pelvis. Aortic Atherosclerosis (ICD10-I70.0). Electronically Signed   By: Rogelia Myers M.D.   On: 05/12/2024 10:17    Cardiac Studies Echo: 04/2021   IMPRESSIONS     1. Definity  contrast does not show any evidence of LV thrombus.      . Left ventricular ejection fraction, by estimation, is 25 to 30%. The  left ventricle has severely decreased function. The  left ventricle  demonstrates regional wall motion abnormalities (see scoring  diagram/findings for description). The left  ventricular internal cavity size was moderately dilated. Left ventricular  diastolic parameters are consistent with Grade I diastolic dysfunction  (impaired  relaxation). There is severe akinesis of the left ventricular,  mid-apical anteroseptal wall, apical   segment and lateral wall.   2. Right ventricular systolic function is normal. The right ventricular  size is normal.   3. Right atrial size was mildly dilated.   4. The mitral valve is normal in structure. No evidence of mitral valve  regurgitation.   5. The aortic valve is normal in structure. Aortic valve regurgitation is  not visualized.    Patient Profile   78 y.o. female with a hx of CAD s/p BMS to proximal LAD, multiple subsequent PCI, chronic systolic CHF, status post AICD, history of PE, COPD, CKD, hypertension, hyperlipidemia, thoracic aortic aneurysm s/p L carotid-subclavian transposition and endovascular repair of aneurysm '19 who is being seen 05/12/2024 for the evaluation of chest pain at the request of Dr. Claudene.   Assessment & Plan  Chest pain- atypical. No evidence of MI by enzymes. Ecg with stable LBBB CAD s/p prior BMS to LAD -- presented with centralized chest soreness and weakness after getting up to do her hair -- hsTn 35>>32. EKG with chronic LBBB -- received 2 baby ASA in the ED with resolution of symptoms -- her mobility is limited at baseline, therefore difficult to assess anginal symptoms PTA- last ischemic evaluation with Myoview  in 2022. Last cath in 2011 -- baseline renal disease with Cr around 2, received [facility] at [facility] therefore would like to avoid additional contrast  -- will plan for lexiscan  myoview  today- knowing it will be abnormal with chronic infarcts and Low EF. Will need to see if any major changes from prior study. If no major ischemia would strongly favor continued medical therapy -- check echo    Chronic systolic HF s/p AICD  -- known LVEF 25-30% -- does not take her lasix  daily, only PRN -- elevated ProBNP, but does not appear volume overloaded on exam. Pro BNP chronically elevated.  -- hold lasix  for now, switch bisoprolol  to metoprolol   25mg  BID as her HR is elevated    HTN -- initially elevated, now improved -- as above, will start metoprolol  25mg  BID    Thoracic aortic aneurysm s/p L carotid-subclavian transposition and endovascular repair of aneurysm '19 -- stable on CT this admit   CKD IV -- baseline Cr around 2, GFR 22. As above, would like to avoid additional contrast if possible   For questions or updates, please contact [facility] Please consult [email or web address] for contact info under   Addendum: informed that nuclear medicine camera is down and it is possible that study cannot be done today. If that is the case we can arrange outpatient Myoview  or PET CT.    Signed, Katlin Bortner [place], MD  05/13/2024, 8:59 AM

## 2024-05-13 NOTE — Progress Notes (Signed)
 OT Cancellation Note  Patient Details Name: [name] MRN: [id number] DOB: 02-05-1946   Cancelled Treatment:    Reason Eval/Treat Not Completed: Other (comment),  Just returned to bed after working with Mobility.  Continue efforts as able.    Maccoy Haubner D Brax Walen 05/13/2024, 2:45 PM 05/13/2024  RP, OTR/L  Acute Rehabilitation Services  Office:  [phone]

## 2024-05-13 NOTE — Progress Notes (Signed)
 Heart Failure Navigator Progress Note  Assessed for Heart & Vascular TOC clinic readiness.  Patient does not meet criteria due to has a scheduled [facility] appointment on 05/25/2024. .   Navigator will sign off at this time.    Stephane Haddock, BSN, Scientist, clinical (histocompatibility and immunogenetics) Only

## 2024-05-13 NOTE — Evaluation (Signed)
 Physical Therapy Evaluation Patient Details Name: [name] MRN: [id number] DOB: 07-09-1946 Today's Date: 05/13/2024  History of Present Illness  Pt is a 78 y/o female presenting to the ED 10/16 for evaluation of CP.  Work up continues, out patient follow up for nuclear stress test.  PMHx:  CAD with ant STEMI,  iCM, sCHF, COPD, DDD, s/jude ICD  Clinical Impression  Pt is at or close to baseline functioning and should be safe at home with brother's close by assist.  Pt needs supervision overall for mobility. There are no further acute PT needs.  Will sign off at this time.            If plan is discharge home, recommend the following:  (prn assist from brother)   Can travel by private vehicle        Equipment Recommendations None recommended by PT  Recommendations for Other Services       Functional Status Assessment Patient has had a recent decline in their functional status and demonstrates the ability to make significant improvements in function in a reasonable and predictable amount of time.     Precautions / Restrictions Precautions Precautions:  (low fall risk) Recall of Precautions/Restrictions: Intact Restrictions Weight Bearing Restrictions Per Provider Order: No      Mobility  Bed Mobility Overal bed mobility: Modified Independent                  Transfers Overall transfer level: Needs assistance   Transfers: Sit to/from Stand Sit to Stand: Supervision           General transfer comment: appropriately safe technique.    Ambulation/Gait Ambulation/Gait assistance: Supervision Gait Distance (Feet): 20 Feet (x2 in the room) Assistive device: None Gait Pattern/deviations: Step-through pattern   Gait velocity interpretation: <1.31 ft/sec, indicative of household ambulator   General Gait Details: mildly unsteady, but safe enough in a homelike environment  Stairs            Wheelchair Mobility     Tilt Bed    Modified Rankin  (Stroke Patients Only)       Balance Overall balance assessment: Needs assistance Sitting-balance support: No upper extremity supported, Feet supported Sitting balance-Leahy Scale: Good     Standing balance support: No upper extremity supported Standing balance-Leahy Scale: Fair                               Pertinent Vitals/Pain Pain Assessment Pain Assessment: Faces Faces Pain Scale: No hurt Pain Intervention(s): Monitored during session    Home Living Family/patient expects to be discharged to:: Private residence Living Arrangements: Alone Available Help at Discharge: Family;Available PRN/intermittently (brother in the same complex) Type of Home: Apartment Home Access: Stairs to enter   Entrance Stairs-Number of Steps: 1   Home Layout: One level Home Equipment: Agricultural consultant (2 wheels);BSC/3in1;Grab bars - tub/shower      Prior Function Prior Level of Function : Independent/Modified Independent;Driving (minimal driving)             Mobility Comments: mod I/I usually without AD, but limited distance due to dyspnea. ADLs Comments: mod I for ADL's, more difficulty with iADL's ie halling groceries, cleaning.     Extremity/Trunk Assessment   Upper Extremity Assessment Upper Extremity Assessment: Overall WFL for tasks assessed    Lower Extremity Assessment Lower Extremity Assessment: Overall WFL for tasks assessed;Generalized weakness    Cervical / Trunk Assessment Cervical /  Trunk Assessment: Normal  Communication   Communication Communication: No apparent difficulties    Cognition Arousal: Alert Behavior During Therapy: WFL for tasks assessed/performed   PT - Cognitive impairments: No apparent impairments                         Following commands: Intact       Cueing Cueing Techniques: Verbal cues     General Comments      Exercises     Assessment/Plan    PT Assessment Patient needs continued PT services  PT  Problem List Decreased strength;Decreased balance;Decreased mobility;Cardiopulmonary status limiting activity       PT Treatment Interventions DME instruction;Gait training;Functional mobility training;Therapeutic activities;Balance training;Patient/family education    PT Goals (Current goals can be found in the Care Plan section)  Acute Rehab PT Goals Patient Stated Goal: home today. PT Goal Formulation: With patient Time For Goal Achievement: 05/20/24 Potential to Achieve Goals: Good    Frequency Min 2X/week     Co-evaluation               AM-PAC PT 6 Clicks Mobility  Outcome Measure Help needed turning from your back to your side while in a flat bed without using bedrails?: None Help needed moving from lying on your back to sitting on the side of a flat bed without using bedrails?: A Little   Help needed standing up from a chair using your arms (e.g., wheelchair or bedside chair)?: A Little Help needed to walk in hospital room?: A Little Help needed climbing 3-5 steps with a railing? : A Little 6 Click Score: 16    End of Session   Activity Tolerance: Patient tolerated treatment well Patient left: in bed;with call bell/phone within reach Nurse Communication: Mobility status PT Visit Diagnosis: Other abnormalities of gait and mobility (R26.89);Muscle weakness (generalized) (M62.81)    Time: 8465-8450 PT Time Calculation (min) (ACUTE ONLY): 15 min   Charges:   PT Evaluation $PT Eval Moderate Complexity: 1 Mod   PT General Charges $$ ACUTE PT VISIT: 1 Visit         05/13/2024  India HERO., PT Acute Rehabilitation Services [phone]  (office)  Vinie GAILS Irelyn Perfecto 05/13/2024, 4:15 PM

## 2024-05-13 NOTE — Discharge Summary (Signed)
 Physician Discharge Summary   Patient: [name] MRN: [id number] DOB: Oct 11, 1945  Admit date:     05/12/2024  Discharge date: 05/13/24  Discharge Physician: Garnette Pelt   PCP: Purcell Emil Schanz, MD   Recommendations at discharge:    Follow up with PCP in 1-2 weeks Follow up with Cardiology as scheduled Follow up with outpatient stress test per Cardioloy   Discharge Diagnoses: Principal Problem:   Chest pain Active Problems:   Essential hypertension   AICD (automatic cardioverter/defibrillator) present   Coronary artery disease involving native coronary artery of native heart with angina pectoris   COPD (chronic obstructive pulmonary disease) (HCC)   Dyslipidemia   HFrEF (heart failure with reduced ejection fraction) (HCC)   Stage 4 chronic kidney disease (HCC)   History of coronary artery stent placement   Hypertensive heart and kidney disease with HF and with CKD stage IV (HCC)   Mixed hyperlipidemia   Chronic heart failure with reduced ejection fraction (HFrEF, <= 40%) (HCC)   Atherosclerotic heart disease  Resolved Problems:   * No resolved hospital problems. *  Hospital Course: LADAISHA Roberts is a 78 y.o. female with medical history significant for CAD s/p BMS to pLAD with multiple subsequent PCI, chronic HFrEF (EF 25-30% 2022) s/p AICD, history of PE s/p Coumadin course, CKD stage IV, COPD, HTN, HLD, thoracic aortic aneurysm s/p Lt carotid-subclavian transposition and endovascular repair 2019 who is admitted for evaluation of chest pain.  Assessment and Plan: Chest pain CAD s/p multiple PCI: Patient presenting with atypical chest pain.  Minimal troponin elevation 35 > 32.  EKG without significant acute ischemic changes. - Cardiology following - Echocardiogram without significant change to baseline - Home bisoprolol  changed to Lopressor  25 mg PO BID per cardiology - Imdur  15 mg daily added - Cardiology now planning outpt stress test - Continue aspirin   and rosuvastatin  -discussed with Cardiology. OK to d/c today   Chronic HFrEF s/p AICD: -Repeat 2d echo without significant change noted  - Cardiology following - Pt taking diuretics on as needed basis - Continue Lopressor    CKD stage IV: Creatinine slightly increased from previous at presentation Cr improved by day of d/c   Hypertension: Continue hydralazine , Lopressor , Imdur .   COPD: Stable, continue Stiolto Respimat  and albuterol  as needed.   Hyperlipidemia: Continue rosuvastatin .   Chronic Pain: Continue home tramadol .    Consultants: Cardiology Procedures performed: 2d echo  Disposition: Home Diet recommendation:  Cardiac diet DISCHARGE MEDICATION: Allergies as of 05/13/2024       Reactions   Sulfa Antibiotics Other (See Comments)   Unknown, childhood allergy    Sulfonamide Derivatives Other (See Comments)   UNSURE   Zestril  [lisinopril ] Cough        Medication List     STOP taking these medications    bisoprolol  5 MG tablet Commonly known as: ZEBETA    ciprofloxacin  250 MG tablet Commonly known as: Cipro    diclofenac  Sodium 1 % Gel Commonly known as: Voltaren  Arthritis Pain       TAKE these medications    amitriptyline  50 MG tablet Commonly known as: ELAVIL  TAKE 1 TABLET (50 MG TOTAL) BY MOUTH AT BEDTIME.   aspirin  EC 81 MG tablet Take 81 mg by mouth at bedtime.   furosemide  40 MG tablet Commonly known as: LASIX  Take 1 tablet (40 mg total) by mouth daily.   hydrALAZINE  10 MG tablet Commonly known as: APRESOLINE  TAKE 1 TABLET BY MOUTH 3 TIMES DAILY. KEEP UPCOMING APPOINTMENT FOR REFILLS  isosorbide  mononitrate 30 MG 24 hr tablet Commonly known as: IMDUR  Take 0.5 tablets (15 mg total) by mouth daily. Start taking on: May 14, 2024   metoprolol  tartrate 25 MG tablet Commonly known as: LOPRESSOR  Take 1 tablet (25 mg total) by mouth 2 (two) times daily.   nitroGLYCERIN  0.4 MG SL tablet Commonly known as: NITROSTAT  Place 1  tablet (0.4 mg total) under the tongue every 5 (five) minutes as needed for chest pain.   Pain Relief Extra Strength 500 MG tablet Generic drug: acetaminophen  Take 2 tablets (1,000 mg total) by mouth every 6 (six) hours as needed (back pain.).   polyethylene glycol powder 17 GM/SCOOP powder Commonly known as: GLYCOLAX /MIRALAX  Take 17 g by mouth daily as needed for mild constipation.   rosuvastatin  20 MG tablet Commonly known as: CRESTOR  TAKE 1 TABLET (20 MG TOTAL) BY MOUTH AT BEDTIME. KEEP UPCOMING APPOINTMENT FOR REFILLS   Stiolto Respimat  2.5-2.5 MCG/ACT Aers Generic drug: Tiotropium Bromide-Olodaterol INHALE 2 PUFFS INTO THE LUNGS DAILY. NEED APPOINTMENT FOR FURTHER REFILLS   topiramate  50 MG tablet Commonly known as: TOPAMAX  TAKE 1 TABLET (50 MG TOTAL) BY MOUTH DAILY.   traMADol  50 MG tablet Commonly known as: ULTRAM  Take 1 tablet (50 mg total) by mouth every 12 (twelve) hours as needed. Can use up to twice daily if needed; do not exceed 30 tabs in 1 month What changed:  reasons to take this additional instructions        Follow-up Information     [facility] Dept of [facility] [employer] Follow up.   Specialty: Cardiology Why: Nuclear Stress Test Scheduled on Monday 05/16/24 at 7:30 AM Please arrive by 7:15 AM to check in Please see end of After Visit Summary for Stress Test Instructions Contact information: [address] [place]  [address] [phone]        [name] T, PA-C Follow up.   Specialties: Cardiology, Physician Assistant Why: Davene Nicolas - cardiology follow-up appointment scheduled with Roberts Tammy on Wednesday May 25, 2024 at 1:55 PM (Arrive by 1:35 PM). Contact information: [address] [place] [address] [phone]         Purcell Emil Schanz, MD Follow up in 2 week(s).   Specialty: Internal Medicine Why: Hospital follow up Contact information: [address] [place]  [address] [phone]                Discharge Exam: Filed Weights   05/12/24 1821 05/13/24 0509  Weight: 75 kg 75.3 kg   General exam: Awake, laying in bed, in nad Respiratory system: Normal respiratory effort, no wheezing Cardiovascular system: regular rate, s1, s2 Gastrointestinal system: Soft, nondistended, positive BS Central nervous system: CN2-12 grossly intact, strength intact Extremities: Perfused, no clubbing Skin: Normal skin turgor, no notable skin lesions seen Psychiatry: Mood normal // no visual hallucinations   Condition at discharge: fair  The results of significant diagnostics from this hospitalization (including imaging, microbiology, ancillary and laboratory) are listed below for reference.   Imaging Studies: ECHOCARDIOGRAM COMPLETE Result Date: 05/13/2024    ECHOCARDIOGRAM REPORT   Patient Name:   NUVIA HILEMAN Date of Exam: 05/13/2024 Medical Rec #:  [id number]        Height:       62.0 in Accession #:    [id number]       Weight:       166.0 lb Date of Birth:  1946-06-07  BSA:          1.766 m Patient Age:    78 years         BP:           116/65 mmHg Patient Gender: F                HR:           75 bpm. Exam Location:  Inpatient Procedure: 2D Echo, Color Doppler, Cardiac Doppler and Intracardiac            Opacification Agent (Both Spectral and Color Flow Doppler were            utilized during procedure). Indications:    CHF R07.9  History:        Patient has prior history of Echocardiogram examinations, most                 recent 05/01/2021. CHF, CAD and Previous Myocardial Infarction,                 Pacemaker and Defibrillator, COPD; Risk Factors:Hypertension.  Sonographer:    Tinnie Gosling RDCS Referring Phys: [phone] LINDSAY B ROBERTS IMPRESSIONS  1. Left ventricular ejection fraction, by estimation, is 25 to 30%. The left ventricle has severely decreased function. The left ventricle demonstrates regional wall motion abnormalities (see scoring  diagram/findings for description). The left ventricular internal cavity size was mildly dilated. Left ventricular diastolic parameters are indeterminate.  2. Right ventricular systolic function is normal. The right ventricular size is normal.  3. The mitral valve is normal in structure. Trivial mitral valve regurgitation. No evidence of mitral stenosis.  4. The aortic valve is normal in structure. Aortic valve regurgitation is not visualized. No aortic stenosis is present. Comparison(s): A prior study was performed on 05/01/2021. There was akinesis of the mid-apical anteroseptal wall, apical segment and lateral wall with severely reduced systolic function and an ejection fraction of 25-30%. FINDINGS  Left Ventricle: Left ventricular ejection fraction, by estimation, is 25 to 30%. The left ventricle has severely decreased function. The left ventricle demonstrates regional wall motion abnormalities. Definity  contrast agent was given IV to delineate the left ventricular endocardial borders. The left ventricular internal cavity size was mildly dilated. There is no left ventricular hypertrophy. Left ventricular diastolic parameters are indeterminate.  LV Wall Scoring: The entire anterior wall, entire septum, and apex are akinetic. Right Ventricle: The right ventricular size is normal. No increase in right ventricular wall thickness. Right ventricular systolic function is normal. Left Atrium: Left atrial size was normal in size. Right Atrium: Right atrial size was normal in size. Pericardium: There is no evidence of pericardial effusion. Mitral Valve: The mitral valve is normal in structure. Trivial mitral valve regurgitation. No evidence of mitral valve stenosis. Tricuspid Valve: The tricuspid valve is normal in structure. Tricuspid valve regurgitation is not demonstrated. No evidence of tricuspid stenosis. Aortic Valve: The aortic valve is normal in structure. Aortic valve regurgitation is not visualized. No aortic  stenosis is present. Pulmonic Valve: The pulmonic valve was normal in structure. Pulmonic valve regurgitation is trivial. No evidence of pulmonic stenosis. Aorta: The aortic root is normal in size and structure. Venous: The inferior vena cava was not well visualized. IAS/Shunts: No atrial level shunt detected by color flow Doppler. Additional Comments: A device lead is visualized in the right ventricle.  LEFT VENTRICLE PLAX 2D LVIDd:         5.80 cm   Diastology LVIDs:  4.60 cm   LV e' medial:  4.35 cm/s LV PW:         1.00 cm   LV e' lateral: 5.55 cm/s LV IVS:        1.00 cm LVOT diam:     2.00 cm LV SV:         49 LV SV Index:   28 LVOT Area:     3.14 cm  RIGHT VENTRICLE RV S prime:     7.18 cm/s TAPSE (M-mode): 2.1 cm LEFT ATRIUM             Index        RIGHT ATRIUM          Index LA diam:        3.50 cm 1.98 cm/m   RA Area:     8.83 cm LA Vol (A2C):   29.8 ml 16.87 ml/m  RA Volume:   14.50 ml 8.21 ml/m LA Vol (A4C):   42.4 ml 24.01 ml/m LA Biplane Vol: 37.6 ml 21.29 ml/m  AORTIC VALVE LVOT Vmax:   85.50 cm/s LVOT Vmean:  55.000 cm/s LVOT VTI:    0.155 m  AORTA Ao Root diam: 2.90 cm Ao Asc diam:  3.20 cm  SHUNTS Systemic VTI:  0.16 m Systemic Diam: 2.00 cm Emeline Calender Electronically signed by Emeline Calender Signature Date/Time: 05/13/2024/3:21:33 PM    Final    CT Angio Chest/Abd/Pel for Dissection W and/or W/WO Result Date: 05/12/2024 CLINICAL DATA:  Acute aortic syndrome (AAS) suspected EXAM: CT ANGIOGRAPHY CHEST, ABDOMEN AND PELVIS TECHNIQUE: Non-contrast CT of the chest was initially obtained. Multidetector CT imaging through the chest, abdomen and pelvis was performed using the standard protocol during bolus administration of intravenous contrast. Multiplanar reconstructed images and MIPs were obtained and reviewed to evaluate the vascular anatomy. RADIATION DOSE REDUCTION: This exam was performed according to the departmental dose-optimization program which includes automated exposure  control, adjustment of the mA and/or kV according to patient size and/or use of iterative reconstruction technique. CONTRAST:  80mL OMNIPAQUE  IOHEXOL  350 MG/ML SOLN COMPARISON:  05/01/2022, 07/06/2019 FINDINGS: CTA CHEST FINDINGS Pulmonary Embolism: No pulmonary embolism. Cardiovascular: Mild cardiomegaly with multi-vessel coronary atherosclerosis. No pericardial effusion. No aortic aneurysm, intramural hematoma, or aortic dissection. Well seated endovascular aortic stent graft in the arch and proximal descending aorta. No periaortic fluid collection. Left chest pacemaker/AICD with a single lead terminating in the right ventricle. Mediastinum/Nodes: No mediastinal mass. No mediastinal, hilar, or axillary lymphadenopathy. Lungs/Pleura: The midline trachea and bronchi are patent. Posterior bibasilar dependent atelectasis. No focal airspace consolidation, pleural effusion, or pneumothorax. Review of the MIP images confirms the above findings. CTA ABDOMEN AND PELVIS FINDINGS VASCULAR Aorta: No aortic aneurysm or aortic dissection. Diffuse aortic atherosclerosis. No hemodynamically significant stenosis. Celiac: Patent without acute thrombus, aneurysm, or dissection. No hemodynamically significant stenosis. SMA: Patent without acute thrombus, aneurysm, or dissection. No hemodynamically significant stenosis. Renals: Moderate to severe narrowing of both renal artery ostia from mixed density plaque. No renal artery aneurysm or dissection. IMA: Moderate stenosis of the IMA ostium from calcified plaque. No aneurysm or acute thrombosis. Inflow: Patent without acute thrombus, aneurysm, or dissection. No hemodynamically significant stenosis. Proximal Outflow: The bilateral common femoral and visualized portions of the superficial and profunda femoral arteries are patent without acute thrombus, aneurysm, or dissection. No hemodynamically significant stenosis. Veins: Nondiagnostic evaluation of the veins due to arterial timing of  the contrast bolus. Review of the MIP images confirms the above findings. NON-VASCULAR Hepatobiliary:  Unchanged small hypodensity in the hepatic dome, likely a small cyst or biliary hamartoma.No radiopaque stones or wall thickening of the gallbladder. No intrahepatic or extrahepatic biliary ductal dilation. Pancreas: No mass or main ductal dilation.No peripancreatic inflammation or fluid collection. Spleen: Normal size. No mass. Adrenals/Urinary Tract: No adrenal masses. No renal mass. Renal cortical atrophy noted in both kidneys. No hydronephrosis or nephrolithiasis. The urinary bladder is completely decompressed. Stomach/Bowel: The stomach is decompressed without focal abnormality. No small bowel wall thickening or inflammation. No small bowel obstruction.Normal appendix. Descending and sigmoid colonic diverticulosis. No changes of acute diverticulitis. Lymphatic: No intraabdominal or pelvic lymphadenopathy. Reproductive: Hysterectomy. No concerning adnexal mass. No free pelvic fluid. Other: No pneumoperitoneum, ascites, or mesenteric inflammation. Musculoskeletal: No acute fracture or destructive lesion.Multilevel degenerative disc disease of the spine. Diffuse osteopenia. L4 through S1 lumbosacral fusion hardware. Laminectomy changes at these levels. Review of the MIP images confirms the above findings. IMPRESSION: 1. Similarly well seated endovascular aortic stent graft in the arch and proximal descending aorta. No findings to suggest endoleak. No aortic aneurysm, intramural hematoma, or aortic dissection. 2. No pneumonia, pulmonary edema, or pleural effusion. No acute abnormality within the abdomen or pelvis. Aortic Atherosclerosis (ICD10-I70.0). Electronically Signed   By: Rogelia Myers M.D.   On: 05/12/2024 10:17   CUP PACEART REMOTE DEVICE CHECK Result Date: 05/05/2024 ICD Scheduled remote reviewed. Normal device function.  Presenting rhythm:  VS.  HF diagnostics have been abnormal in this monitoring  period. Next remote transmission per protocol. - CS, CVRS  DG Cervical Spine 2 or 3 views Result Date: 04/26/2024 CLINICAL DATA:  78 year old female with neck pain greater on the left, headaches for 3 months. Prior surgery. EXAM: DG CERVICAL SPINE 2 OR 3 VIEWS COMPARISON:  Preoperative cervical spine MRI 05/29/2007. Intraoperative images 125 2008. FINDINGS: Three views. Prevertebral soft tissue contours within normal limits. C4-C5 cervical ACDF, hardware appears intact and evidence of interbody arthrodesis on the lateral view. Advanced disc space loss C5-C6 and C6-C7. Degenerative endplate spurring at those levels, bulky endplate spurring at the C3-C4 adjacent segment with mild to moderate disc space loss. C3-C4 facet hypertrophy also greater on the right. Maintained C1-C2 alignment and joint spaces. No acute osseous abnormality identified. Partially visible thoracic aortic endograft, left chest pacemaker type device. Negative lung apices. IMPRESSION: 1. C4-C5 ACDF with no adverse features. 2. Bulky adjacent segment disease at C3-C4, including endplate and asymmetric right facet hypertrophy. Advanced chronic disc degeneration C5-C6 and C6-C7. Electronically Signed   By: VEAR Hurst M.D.   On: 04/26/2024 06:02    Microbiology: Results for orders placed or performed during the hospital encounter of 05/12/24  Resp panel by RT-PCR (RSV, Flu A&B, Covid) Anterior Nasal Swab     Status: None   Collection Time: 05/12/24 11:55 AM   Specimen: Anterior Nasal Swab  Result Value Ref Range Status   SARS Coronavirus 2 by RT PCR NEGATIVE NEGATIVE Final    Comment: (NOTE) SARS-CoV-2 target nucleic acids are NOT DETECTED.  The SARS-CoV-2 RNA is generally detectable in upper respiratory specimens during the acute phase of infection. The lowest concentration of SARS-CoV-2 viral copies this assay can detect is 138 copies/mL. A negative result does not preclude SARS-Cov-2 infection and should not be used as the sole basis  for treatment or other patient management decisions. A negative result may occur with  improper specimen collection/handling, submission of specimen other than nasopharyngeal swab, presence of viral mutation(s) within the areas targeted by this assay, and inadequate  number of viral copies(<138 copies/mL). A negative result must be combined with clinical observations, patient history, and epidemiological information. The expected result is Negative.  Fact Sheet for Patients:  BloggerCourse.com  Fact Sheet for Healthcare Providers:  SeriousBroker.it  This test is no t yet approved or cleared by the [place]  FDA and  has been authorized for detection and/or diagnosis of SARS-CoV-2 by FDA under an Emergency Use Authorization (EUA). This EUA will remain  in effect (meaning this test can be used) for the duration of the COVID-19 declaration under Section 564(b)(1) of the Act, 21 U.S.C.section 360bbb-3(b)(1), unless the authorization is terminated  or revoked sooner.       Influenza A by PCR NEGATIVE NEGATIVE Final   Influenza B by PCR NEGATIVE NEGATIVE Final    Comment: (NOTE) The Xpert Xpress SARS-CoV-2/FLU/RSV plus assay is intended as an aid in the diagnosis of influenza from Nasopharyngeal swab specimens and should not be used as a sole basis for treatment. Nasal washings and aspirates are unacceptable for Xpert Xpress SARS-CoV-2/FLU/RSV testing.  Fact Sheet for Patients: BloggerCourse.com  Fact Sheet for Healthcare Providers: SeriousBroker.it  This test is not yet approved or cleared by the [place]  FDA and has been authorized for detection and/or diagnosis of SARS-CoV-2 by FDA under an Emergency Use Authorization (EUA). This EUA will remain in effect (meaning this test can be used) for the duration of the COVID-19 declaration under Section 564(b)(1) of the Act, 21  U.S.C. section 360bbb-3(b)(1), unless the authorization is terminated or revoked.     Resp Syncytial Virus by PCR NEGATIVE NEGATIVE Final    Comment: (NOTE) Fact Sheet for Patients: BloggerCourse.com  Fact Sheet for Healthcare Providers: SeriousBroker.it  This test is not yet approved or cleared by the [place]  FDA and has been authorized for detection and/or diagnosis of SARS-CoV-2 by FDA under an Emergency Use Authorization (EUA). This EUA will remain in effect (meaning this test can be used) for the duration of the COVID-19 declaration under Section 564(b)(1) of the Act, 21 U.S.C. section 360bbb-3(b)(1), unless the authorization is terminated or revoked.  Performed at [facility], [address] [place], [place] [address]    *Note: Due to a large number of results and/or encounters for the requested time period, some results have not been displayed. A complete set of results can be found in Results Review.    Labs: CBC: Recent Labs  Lab 05/12/24 0843 05/13/24 0255  WBC 8.9 7.1  HGB 12.8 11.2*  HCT 37.9 34.7*  MCV 99.5 102.1*  PLT 208 187   Basic Metabolic Panel: Recent Labs  Lab 05/12/24 0843 05/13/24 0255  NA 138 139  K 3.6 3.7  CL 105 112*  CO2 18* 19*  GLUCOSE 134* 111*  BUN 20 25*  CREATININE 2.26* 2.15*  CALCIUM  9.6 8.6*   Liver Function Tests: No results for input(s): AST, ALT, ALKPHOS, BILITOT, PROT, ALBUMIN  in the last 168 hours. CBG: Recent Labs  Lab 05/13/24 0628  GLUCAP 120*    Discharge time spent: less than 30 minutes.  Signed: Garnette Pelt, MD [facility] 05/13/2024

## 2024-05-16 ENCOUNTER — Observation Stay (HOSPITAL_BASED_OUTPATIENT_CLINIC_OR_DEPARTMENT_OTHER)
Admit: 2024-05-16 | Discharge: 2024-05-16 | Disposition: A | Discharge status: Home / Self Care | Attending: Cardiology | Admitting: Cardiology

## 2024-05-16 ENCOUNTER — Ambulatory Visit: Payer: Self-pay | Admitting: Cardiology

## 2024-05-16 ENCOUNTER — Telehealth: Payer: Self-pay

## 2024-05-16 ENCOUNTER — Other Ambulatory Visit: Payer: Self-pay | Admitting: Cardiology

## 2024-05-16 DIAGNOSIS — R072 Precordial pain: Secondary | ICD-10-CM

## 2024-05-16 DIAGNOSIS — R079 Chest pain, unspecified: Secondary | ICD-10-CM | POA: Diagnosis not present

## 2024-05-16 LAB — MYOCARDIAL PERFUSION IMAGING
LV dias vol: 210 mL (ref 46–106)
LV sys vol: 139 mL (ref 3.8–5.2)
Nuc Stress EF: 34 %
Peak HR: 106 {beats}/min
Rest HR: 90 {beats}/min
Rest Nuclear Isotope Dose: 10.5 mCi
SDS: 0
SRS: 34
SSS: 31
ST Depression (mm): 0 mm
Stress Nuclear Isotope Dose: 32.4 mCi
TID: 1.08

## 2024-05-16 MED ORDER — TECHNETIUM TC 99M TETROFOSMIN IV KIT
10.9000 | PACK | Freq: Once | INTRAVENOUS | Status: AC | PRN
  Administered 2024-05-16: 10.9 via INTRAVENOUS

## 2024-05-16 MED ORDER — TECHNETIUM TC 99M TETROFOSMIN IV KIT
32.4000 | PACK | Freq: Once | INTRAVENOUS | Status: AC | PRN
  Administered 2024-05-16: 32.4 via INTRAVENOUS

## 2024-05-16 MED ORDER — REGADENOSON 0.4 MG/5ML IV SOLN
0.4000 mg | Freq: Once | INTRAVENOUS | Status: AC
  Administered 2024-05-16: 0.4 mg via INTRAVENOUS

## 2024-05-16 MED ORDER — REGADENOSON 0.4 MG/5ML IV SOLN
INTRAVENOUS | Status: AC
  Filled 2024-05-16: qty 5

## 2024-05-16 NOTE — Transitions of Care (Post Inpatient/ED Visit) (Signed)
 05/16/2024  Name: [name] MRN: [id number] DOB: 1945/12/22  Today's TOC FU Call Status: Today's TOC FU Call Status:: Successful TOC FU Call Completed TOC FU Call Complete Date: 05/16/24 Patient's Name and Date of Birth confirmed.  Transition Care Management Follow-up Telephone Call Date of Discharge: 05/13/24 Discharge Facility: [facility]) Type of Discharge: Inpatient Admission Primary Inpatient Discharge Diagnosis:: chest pain How have you been since you were released from the hospital?: Better Any questions or concerns?: No  Items Reviewed: Did you receive and understand the discharge instructions provided?: Yes Medications obtained,verified, and reconciled?: Yes (Medications Reviewed) Any new allergies since your discharge?: No Dietary orders reviewed?: Yes Do you have support at home?: No  Medications Reviewed Today: Medications Reviewed Today     Reviewed by Emmitt Pan, LPN (Licensed Practical Nurse) on 05/16/24 at 1444  Med List Status: <None>   Medication Order Taking? Sig Documenting Provider Last Dose Status Informant  acetaminophen  (TYLENOL ) 500 MG tablet [id number] Yes Take 2 tablets (1,000 mg total) by mouth every 6 (six) hours as needed (back pain.). Redwine, Madison A, PA-C  Active Self  amitriptyline  (ELAVIL ) 50 MG tablet [id number] Yes TAKE 1 TABLET (50 MG TOTAL) BY MOUTH AT BEDTIME. Sagardia, Miguel Jose, MD  Active Self  aspirin  81 MG EC tablet [id number] Yes Take 81 mg by mouth at bedtime. [provider]  Active Self  furosemide  (LASIX ) 40 MG tablet [id number] Yes Take 1 tablet (40 mg total) by mouth daily. Lelon Darleen Moffitt T, PA-C  Active Self  hydrALAZINE  (APRESOLINE ) 10 MG tablet [id number] Yes TAKE 1 TABLET BY MOUTH 3 TIMES DAILY. KEEP UPCOMING APPOINTMENT FOR REFILLS Wonda Sharper, MD  Active Self  isosorbide  mononitrate (IMDUR ) 30 MG 24 hr tablet [id number] Yes Take 0.5 tablets (15 mg total) by mouth daily. Cindy Garnette POUR, MD   Active   metoprolol  tartrate (LOPRESSOR ) 25 MG tablet [id number] Yes Take 1 tablet (25 mg total) by mouth 2 (two) times daily. Cindy Garnette POUR, MD  Active   nitroGLYCERIN  (NITROSTAT ) 0.4 MG SL tablet [id number] Yes Place 1 tablet (0.4 mg total) under the tongue every 5 (five) minutes as needed for chest pain. Cindy Garnette POUR, MD  Active   polyethylene glycol powder (GLYCOLAX /MIRALAX ) powder [id number] Yes Take 17 g by mouth daily as needed for mild constipation. Melonie Colonel, Mikel HERO, MD  Active Self  rosuvastatin  (CRESTOR ) 20 MG tablet [id number] Yes TAKE 1 TABLET (20 MG TOTAL) BY MOUTH AT BEDTIME. KEEP UPCOMING APPOINTMENT FOR REFILLS Wonda Sharper, MD  Active Self  STIOLTO RESPIMAT  2.5-2.5 MCG/ACT AERS [id number] Yes INHALE 2 PUFFS INTO THE LUNGS DAILY. NEED APPOINTMENT FOR FURTHER REFILLS Hunsucker, Donnice SAUNDERS, MD  Active Self  topiramate  (TOPAMAX ) 50 MG tablet [id number] Yes TAKE 1 TABLET (50 MG TOTAL) BY MOUTH DAILY. Sagardia, Miguel Jose, MD  Active Self  traMADol  (ULTRAM ) 50 MG tablet [id number] Yes Take 1 tablet (50 mg total) by mouth every 12 (twelve) hours as needed. Can use up to twice daily if needed; do not exceed 30 tabs in 1 month  Patient taking differently: Take 50 mg by mouth every 12 (twelve) hours as needed for moderate pain (pain score 4-6).   [name], DO  Active Self           Med Note (SATTERFIELD, DARIUS E   Thu May 12, 2024 10:20 PM) Still has on hand             Home Care and Equipment/Supplies: Were Home  Health Services Ordered?: NA Any new equipment or medical supplies ordered?: NA  Functional Questionnaire: Do you need assistance with bathing/showering or dressing?: No Do you need assistance with meal preparation?: No Do you need assistance with eating?: No Do you have difficulty maintaining continence: No Do you need assistance with getting out of bed/getting out of a chair/moving?: No Do you have difficulty managing or taking your medications?:  No  Follow up appointments reviewed: PCP Follow-up appointment confirmed?: Yes Date of PCP follow-up appointment?: 05/18/24 Follow-up Provider: sagardia Specialist [facility] Follow-up appointment confirmed?: Yes Date of Specialist follow-up appointment?: 05/16/24 Follow-Up Specialty Provider:: cardio Do you need transportation to your follow-up appointment?: No Do you understand care options if your condition(s) worsen?: Yes-patient verbalized understanding    SIGNATURE Julian Lemmings, LPN [facility] Nurse Health Advisor Direct Dial [phone]

## 2024-05-17 ENCOUNTER — Ambulatory Visit
Admission: RE | Admit: 2024-05-17 | Discharge: 2024-05-17 | Disposition: A | Discharge status: Home / Self Care | Source: Ambulatory Visit | Attending: Emergency Medicine | Admitting: Emergency Medicine

## 2024-05-17 ENCOUNTER — Inpatient Hospital Stay: Admitting: Emergency Medicine

## 2024-05-17 ENCOUNTER — Inpatient Hospital Stay: Admission: RE | Admit: 2024-05-17 | Discharge: 2024-05-17 | Attending: Emergency Medicine

## 2024-05-17 DIAGNOSIS — N63 Unspecified lump in unspecified breast: Secondary | ICD-10-CM

## 2024-05-18 ENCOUNTER — Ambulatory Visit: Payer: Self-pay | Admitting: *Deleted

## 2024-05-18 ENCOUNTER — Inpatient Hospital Stay: Admitting: Emergency Medicine

## 2024-05-18 NOTE — Telephone Encounter (Signed)
 FYI Only or Action Required?: FYI only for provider.  Patient was last seen in primary care on 04/18/2024 by Plotnikov, Karlynn GAILS, MD.  Called Nurse Triage reporting Fatigue.  Symptoms began several days ago.  Interventions attempted: Prescription medications: started 2 new medications .  Symptoms are: gradually improving.  Triage Disposition: See PCP When Office is Open (Within 3 Days)  Patient/caregiver understands and will follow disposition?: Yes     Copied from CRM #[id number]. Topic: Clinical - Red Word Triage >> May 18, 2024  8:11 AM Suzen RAMAN wrote: Red Word that prompted transfer to Nurse Triage: soreness in chest,lightheadedness when standing and low energy, previous hospital follow up scheduled for yesterday but patient unable to make it due to transportation, would like to r/s Reason for Disposition  [1] MILD weakness (e.g., does not interfere with ability to work, go to school, normal activities) AND [2] persists > 1 week  Answer Assessment - Initial Assessment Questions Patient calling to reschedule hospital f/u appt due to canceled appt due to no transportation. Patient seen ED 05/12/24 for chest pain. No available f/u visit with PCP until 05/23/24. Patient reports she has transportation Thursday and Friday this week and not Monday of next week. CAL notified to assist with scheduling.       1. DESCRIPTION: Describe how you are feeling.     Feeling better but still low energy  2. SEVERITY: How bad is it?  Can you stand and walk?     Better sx now can walk 3. ONSET: When did these symptoms begin? (e.g., hours, days, weeks, months)     Friday morning.  4. CAUSE: What do you think is causing the weakness or fatigue? (e.g., not drinking enough fluids, medical problem, trouble sleeping)     Fatigue . Not sleeping well.  5. NEW MEDICINES:  Have you started on any new medicines recently? (e.g., opioid pain medicines, benzodiazepines, muscle relaxants,  antidepressants, antihistamines, neuroleptics, beta blockers)     Yes see current medication list . 6. OTHER SYMPTOMS: Do you have any other symptoms? (e.g., chest pain, fever, cough, SOB, vomiting, diarrhea, bleeding, other areas of pain)     Decreased lightheadedness, chest soreness feels a little tight at times but better. Gave 2 new Rx for home .  7. PREGNANCY: Is there any chance you are pregnant? When was your last menstrual period?     na  Protocols used: Weakness (Generalized) and Fatigue-A-AH

## 2024-05-19 ENCOUNTER — Telehealth: Payer: Self-pay

## 2024-05-19 ENCOUNTER — Encounter: Payer: Self-pay | Admitting: Family Medicine

## 2024-05-19 ENCOUNTER — Ambulatory Visit: Admitting: Family Medicine

## 2024-05-19 ENCOUNTER — Ambulatory Visit: Payer: Self-pay | Admitting: Family Medicine

## 2024-05-19 ENCOUNTER — Ambulatory Visit

## 2024-05-19 VITALS — BP 138/80 | HR 96 | Temp 98.3°F | Ht 62.0 in | Wt 168.4 lb

## 2024-05-19 DIAGNOSIS — I25119 Atherosclerotic heart disease of native coronary artery with unspecified angina pectoris: Secondary | ICD-10-CM | POA: Diagnosis not present

## 2024-05-19 DIAGNOSIS — R0602 Shortness of breath: Secondary | ICD-10-CM

## 2024-05-19 DIAGNOSIS — I252 Old myocardial infarction: Secondary | ICD-10-CM

## 2024-05-19 DIAGNOSIS — R0789 Other chest pain: Secondary | ICD-10-CM

## 2024-05-19 DIAGNOSIS — I10 Essential (primary) hypertension: Secondary | ICD-10-CM

## 2024-05-19 DIAGNOSIS — I5020 Unspecified systolic (congestive) heart failure: Secondary | ICD-10-CM

## 2024-05-19 DIAGNOSIS — R7989 Other specified abnormal findings of blood chemistry: Secondary | ICD-10-CM

## 2024-05-19 LAB — COMPREHENSIVE METABOLIC PANEL WITH GFR
ALT: 14 U/L (ref 0–35)
AST: 17 U/L (ref 0–37)
Albumin: 4.2 g/dL (ref 3.5–5.2)
Alkaline Phosphatase: 71 U/L (ref 39–117)
BUN: 30 mg/dL — ABNORMAL HIGH (ref 6–23)
CO2: 23 meq/L (ref 19–32)
Calcium: 9.3 mg/dL (ref 8.4–10.5)
Chloride: 108 meq/L (ref 96–112)
Creatinine, Ser: 1.85 mg/dL — ABNORMAL HIGH (ref 0.40–1.20)
GFR: 26.11 mL/min — ABNORMAL LOW (ref >60.00)
Glucose, Bld: 83 mg/dL (ref 70–99)
Potassium: 4.2 meq/L (ref 3.5–5.1)
Sodium: 139 meq/L (ref 135–145)
Total Bilirubin: 0.5 mg/dL (ref 0.2–1.2)
Total Protein: 7 g/dL (ref 6.0–8.3)

## 2024-05-19 LAB — BRAIN NATRIURETIC PEPTIDE: Pro B Natriuretic peptide (BNP): 205 pg/mL — ABNORMAL HIGH (ref 0.0–100.0)

## 2024-05-19 LAB — CBC WITH DIFFERENTIAL/PLATELET
Basophils Absolute: 0 K/uL (ref 0.0–0.1)
Basophils Relative: 0.6 % (ref 0.0–3.0)
Eosinophils Absolute: 0.2 K/uL (ref 0.0–0.7)
Eosinophils Relative: 2.9 % (ref 0.0–5.0)
HCT: 36 % (ref 36.0–46.0)
Hemoglobin: 11.9 g/dL — ABNORMAL LOW (ref 12.0–15.0)
Lymphocytes Relative: 50.1 % — ABNORMAL HIGH (ref 12.0–46.0)
Lymphs Abs: 3.5 K/uL (ref 0.7–4.0)
MCHC: 33.2 g/dL (ref 30.0–36.0)
MCV: 101.8 fl — ABNORMAL HIGH (ref 78.0–100.0)
Monocytes Absolute: 0.4 K/uL (ref 0.1–1.0)
Monocytes Relative: 6.3 % (ref 3.0–12.0)
Neutro Abs: 2.8 K/uL (ref 1.4–7.7)
Neutrophils Relative %: 40.1 % — ABNORMAL LOW (ref 43.0–77.0)
Platelets: 200 K/uL (ref 150.0–400.0)
RBC: 3.53 Mil/uL — ABNORMAL LOW (ref 3.87–5.11)
RDW: 15.9 % — ABNORMAL HIGH (ref 11.5–15.5)
WBC: 7 K/uL (ref 4.0–10.5)

## 2024-05-19 LAB — D-DIMER, QUANTITATIVE: D-Dimer, Quant: 0.77 ug{FEU}/mL — ABNORMAL HIGH (ref <0.50)

## 2024-05-19 LAB — TROPONIN I (HIGH SENSITIVITY): High Sens Troponin I: 24 ng/L (ref 2–17)

## 2024-05-19 MED ORDER — METOPROLOL TARTRATE 25 MG PO TABS
25.0000 mg | ORAL_TABLET | Freq: Two times a day (BID) | ORAL | 1 refills | Status: DC
Start: 2024-05-19 — End: 2024-06-18

## 2024-05-19 MED ORDER — ISOSORBIDE MONONITRATE ER 30 MG PO TB24: 15.0000 mg | ORAL_TABLET | Freq: Every day | ORAL | 1 refills | Status: DC

## 2024-05-19 NOTE — Progress Notes (Signed)
 "  Acute Office Visit  Subjective:     Patient ID: [name], female    DOB: 1945/08/29, 78 y.o.   MRN: [id number]  Chief Complaint  Patient presents with   Hospitalization Follow-up    HPI  Discussed the use of AI scribe software for clinical note transcription with the patient, who gave verbal consent to proceed.  Hospital follow-up from admission from 05/12/2024 to 05/13/2024 for chest pain.  Hospital notes and results reviewed by me. History of Present Illness [name] is a 78 year old female with a history of heart attacks who presents with shortness of breath and chest heaviness.  She is companied by her husband today  Dyspnea and chest heaviness - Shortness of breath and chest heaviness differ from previous hospital experience - Chest heaviness worsens with movement and improves with rest - Shortness of breath not present at rest - No current cough  Peripheral edema and abdominal distension - Occasional ankle swelling, possibly related to dietary salt intake - Occasional abdominal swelling  Recent hospitalization and anticoagulation - Recently hospitalized - Received Lovenox injections during hospitalization, resulting in bruising  Medication management - Discharged with new medications, including metoprolol  and isosorbide , replacing bisoprolol  - Uncertainty regarding correct medication regimen as her son manages her medications - Takes Lasix  daily - Has not used inhaler recently  Cardiac history and follow-up - History of two myocardial infarctions - Scheduled to see cardiology on October 29th     ROS Per HPI      Objective:    BP 138/80 (BP Location: Left Arm, Patient Position: Sitting)   Pulse 96   Temp 98.3 F (36.8 C) (Temporal)   Ht 5' 2 (1.575 m)   Wt 168 lb 6.4 oz (76.4 kg)   SpO2 94%   BMI 30.80 kg/m    Physical Exam Vitals and nursing note reviewed.  Constitutional:      General: She is not in acute distress.     Comments: Chronically ill-appearing  HENT:     Head: Normocephalic and atraumatic.     Left Ear: External ear normal.     Mouth/Throat:     Mouth: Mucous membranes are moist.     Pharynx: Oropharynx is clear.  Eyes:     Extraocular Movements: Extraocular movements intact.     Pupils: Pupils are equal, round, and reactive to light.  Cardiovascular:     Rate and Rhythm: Normal rate and regular rhythm.     Pulses: Normal pulses.     Heart sounds: Murmur heard.  Pulmonary:     Effort: Pulmonary effort is normal. No respiratory distress.     Breath sounds: Normal breath sounds. No wheezing, rhonchi or rales.  Musculoskeletal:     Cervical back: Normal range of motion.     Right lower leg: Edema (+1) present.     Left lower leg: Edema (+1) present.  Lymphadenopathy:     Cervical: No cervical adenopathy.  Neurological:     General: No focal deficit present.     Mental Status: She is alert and oriented to person, place, and time.  Psychiatric:        Mood and Affect: Mood normal.        Thought Content: Thought content normal.    EKG: Reviewed and interpreted by me Indication: SOB, chest pain Rate: 86 Interpretation: SR with AV block, left axis deviation, nonspecific T wave abnormality Changes from previous: Improved from previous    Results for orders  placed or performed in visit on 05/19/24  CBC with Differential/Platelet  Result Value Ref Range   WBC 7.0 4.0 - 10.5 K/uL   RBC 3.53 (L) 3.87 - 5.11 Mil/uL   Hemoglobin 11.9 (L) 12.0 - 15.0 g/dL   HCT 63.9 63.9 - 53.9 %   MCV 101.8 (H) 78.0 - 100.0 fl   MCHC 33.2 30.0 - 36.0 g/dL   RDW 84.0 (H) 88.4 - 84.4 %   Platelets 200.0 150.0 - 400.0 K/uL   Neutrophils Relative % 40.1 (L) 43.0 - 77.0 %   Lymphocytes Relative 50.1 (H) 12.0 - 46.0 %   Monocytes Relative 6.3 3.0 - 12.0 %   Eosinophils Relative 2.9 0.0 - 5.0 %   Basophils Relative 0.6 0.0 - 3.0 %   Neutro Abs 2.8 1.4 - 7.7 K/uL   Lymphs Abs 3.5 0.7 - 4.0 K/uL    Monocytes Absolute 0.4 0.1 - 1.0 K/uL   Eosinophils Absolute 0.2 0.0 - 0.7 K/uL   Basophils Absolute 0.0 0.0 - 0.1 K/uL  Comprehensive metabolic panel with GFR  Result Value Ref Range   Sodium 139 135 - 145 mEq/L   Potassium 4.2 3.5 - 5.1 mEq/L   Chloride 108 96 - 112 mEq/L   CO2 23 19 - 32 mEq/L   Glucose, Bld 83 70 - 99 mg/dL   BUN 30 (H) 6 - 23 mg/dL   Creatinine, Ser 8.14 (H) 0.40 - 1.20 mg/dL   Total Bilirubin 0.5 0.2 - 1.2 mg/dL   Alkaline Phosphatase 71 39 - 117 U/L   AST 17 0 - 37 U/L   ALT 14 0 - 35 U/L   Total Protein 7.0 6.0 - 8.3 g/dL   Albumin  4.2 3.5 - 5.2 g/dL   GFR 73.88 (L) >39.99 mL/min   Calcium  9.3 8.4 - 10.5 mg/dL  B Nat Peptide  Result Value Ref Range   Pro B Natriuretic peptide (BNP) 205.0 (H) 0.0 - 100.0 pg/mL  D-dimer, quantitative  Result Value Ref Range   D-Dimer, Quant 0.77 (H) <0.50 mcg/mL FEU  Troponin I (High Sensitivity)  Result Value Ref Range   High Sens Troponin I 24 (HH) 2 - 17 ng/L        Assessment & Plan:   Assessment and Plan Assessment & Plan Shortness of breath and chest heaviness Intermittent symptoms, improved EKG, differential includes fluid overload, pneumonia, or pulmonary embolism. No acute myocardial infarction. - Order chest x-ray to assess for fluid overload or pneumonia. - Order D-dimer to rule out pulmonary embolism. - Advise monitoring for worsening symptoms, especially at rest, and return to hospital if symptoms worsen.  Heart failure with reduced ejection fraction Intermittent edema, no significant swelling currently, possible fluid retention due to dietary salt intake. - Continue Lasix  as prescribed. - Advise monitoring dietary salt intake to manage edema.  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 - Refill metoprolol  and isosorbide  prescriptions. - Print and highlight current medication list  for clarity. - Advise verifying current medications at home to avoid duplicate beta blockers.  History of myocardial infarction History of two myocardial infarctions, no current evidence of acute myocardial infarction.  Follow-up Scheduled cardiology follow-up on May 25, 2024. - Ensure follow-up with cardiology on May 25, 2024. - Review lab results and x-ray findings once available and update plan accordingly.     Orders Placed This Encounter  Procedures   DG Chest 2 View  Standing Status:   Future    Number of Occurrences:   1    Expiration Date:   11/17/2024    Reason for Exam (SYMPTOM  OR DIAGNOSIS REQUIRED):   SOB, decreased breath sounds    Preferred imaging location?:   [place]   CBC with Differential/Platelet    Release to patient:   Immediate [1]   Comprehensive metabolic panel with GFR    Release to patient:   Immediate [1]   B Nat Peptide   D-dimer, quantitative   EKG 12-Lead   Rhythm strip     Meds ordered this encounter  Medications   DISCONTD: metoprolol  tartrate (LOPRESSOR ) 25 MG tablet    Sig: Take 1 tablet (25 mg total) by mouth 2 (two) times daily.    Dispense:  60 tablet    Refill:  1   DISCONTD: isosorbide  mononitrate (IMDUR ) 30 MG 24 hr tablet    Sig: Take 0.5 tablets (15 mg total) by mouth daily.    Dispense:  15 tablet    Refill:  1    Return if symptoms worsen or fail to improve.  Corean LITTIE Ku, FNP  "

## 2024-05-19 NOTE — Progress Notes (Signed)
 Improved from hospital visit, trending down. Reassuring given EKG correlation.

## 2024-05-19 NOTE — Patient Instructions (Addendum)
 STOP Bisoprolol   Refills sent for new medications given in the hospital of Metoprolol  and Isosorbide .  EKG has improved since you were in the hospital.   We are checking labs today, will be in contact with any results that require further attention  We are getting an xray today. We will be in contact with any abnormal results that require further attention.  If symptoms worsen, please follow up in the ER for further evaluation and treatment.

## 2024-05-20 NOTE — Telephone Encounter (Signed)
 Noted

## 2024-05-24 ENCOUNTER — Encounter: Payer: Self-pay | Admitting: *Deleted

## 2024-05-25 ENCOUNTER — Ambulatory Visit: Attending: Physician Assistant | Admitting: Physician Assistant

## 2024-05-25 ENCOUNTER — Encounter: Payer: Self-pay | Admitting: Physician Assistant

## 2024-05-25 VITALS — BP 126/72 | HR 80 | Ht 62.0 in | Wt 170.2 lb

## 2024-05-25 DIAGNOSIS — E782 Mixed hyperlipidemia: Secondary | ICD-10-CM | POA: Diagnosis not present

## 2024-05-25 DIAGNOSIS — I5020 Unspecified systolic (congestive) heart failure: Secondary | ICD-10-CM

## 2024-05-25 DIAGNOSIS — I25119 Atherosclerotic heart disease of native coronary artery with unspecified angina pectoris: Secondary | ICD-10-CM

## 2024-05-25 DIAGNOSIS — N184 Chronic kidney disease, stage 4 (severe): Secondary | ICD-10-CM

## 2024-05-25 DIAGNOSIS — R072 Precordial pain: Secondary | ICD-10-CM

## 2024-05-25 DIAGNOSIS — I712 Thoracic aortic aneurysm, without rupture, unspecified: Secondary | ICD-10-CM | POA: Diagnosis not present

## 2024-05-25 MED ORDER — METOPROLOL TARTRATE 25 MG PO TABS: 50.0000 mg | ORAL_TABLET | Freq: Two times a day (BID) | ORAL | 3 refills | Status: AC

## 2024-05-25 MED ORDER — ISOSORBIDE MONONITRATE ER 30 MG PO TB24: 15.0000 mg | ORAL_TABLET | Freq: Every day | ORAL | 3 refills | Status: DC

## 2024-05-25 NOTE — Assessment & Plan Note (Addendum)
 As noted, we discussed the potential risk of contrast-induced nephropathy given her advanced chronic kidney disease.  Hopefully, we can avoid cardiac catheterization.

## 2024-05-25 NOTE — Assessment & Plan Note (Addendum)
 History of anterior STEMI in 2010 treated with BMS to the LAD.  She has had multiple PCI procedures since secondary to in-stent restenosis.  Her last cardiac catheterization in 2011 demonstrated patent LAD stent.  She was recently admitted to the hospital with chest pain.  Cardiac enzymes were elevated but not consistent with ACS.  She was seen by cardiology.  EF remained stable on echocardiogram.  Outpatient pharmacologic nuclear stress test demonstrated anterior scar but no ischemia.  Study was high risk due to low EF.  Medical therapy has been continued.  She continues to experience angina with exertion.  There is a risk of contrast-induced nephropathy due to chronic kidney disease if we decided to pursue cardiac catheterization.  We discussed the rationale for intensification of medical therapy.  This is an an effort to avoid cardiac catheterization if possible. - Increase metoprolol  to tartrate to 50 mg twice daily. - Continue Imdur  15 mg daily. - Continue aspirin  81 mg daily. - Continue rosuvastatin  20 mg daily. - Follow up in 3-4 weeks. - Consider cardiac catheterization if symptoms persist or worsen.

## 2024-05-25 NOTE — Assessment & Plan Note (Addendum)
 Status post endovascular repair in 2018.  CT scan done in the hospital demonstrated stable stent graft and no evidence of endoleak.

## 2024-05-25 NOTE — Patient Instructions (Signed)
 Medication Instructions:   Increase Furosemide  to 40 mg (whole tablet) for 3 days, then resume 20 mg (1/2 tablet).  Increase Metoprolol  tartrate to 2 tablets (50 mg) twice daily   IF you feel better on the whole tablet of furosemide  than you do on the 1/2, let me know.  *If you need a refill on your cardiac medications before your next appointment, please call your pharmacy*    Lab Work:  NONE ORDERED  TODAY     If you have labs (blood work) drawn today and your tests are completely normal, you will receive your results only by: MyChart Message (if you have MyChart) OR A paper copy in the mail If you have any lab test that is abnormal or we need to change your treatment, we will call you to review the results.    Testing/Procedures: NONE ORDERED  TODAY     Follow-Up:   At [facility], you and your health needs are our priority.  As part of our continuing mission to provide you with exceptional heart care, our providers are all part of one team.  This team includes your primary Cardiologist (physician) and Advanced Practice Providers or APPs (Physician Assistants and Nurse Practitioners) who all work together to provide you with the care you need, when you need it.  Your next appointment:   3 -4  week(s)    Provider:     Ozell Fell, MD or Glendia Ferrier, PA-C            We recommend signing up for the patient portal called MyChart.  Sign up information is provided on this After Visit Summary.  MyChart is used to connect with patients for Virtual Visits (Telemedicine).  Patients are able to view lab/test results, encounter notes, upcoming appointments, etc.  Non-urgent messages can be sent to your provider as well.   To learn more about what you can do with MyChart, go to [email or web address].   Other Instructions

## 2024-05-25 NOTE — Progress Notes (Signed)
 OFFICE NOTE:    Date:  05/25/2024  ID:  [name], DOB [birth year], MRN [id number] PCP: Purcell Emil Schanz, MD  [facility] Providers Cardiologist:  Ozell Fell, MD Electrophysiologist:  Eulas FORBES Furbish, MD        Coronary artery disease  S/p anterior MI in 2010 >> PCI: BMS to prox LAD S/p multiple PCIs since MI in 2010 2/2 ISR LHC 06/2010: prox LAD stent ok with 30-50% ISR, prox to mid RCA 30-40%.  Myoview  04/2019: Lg scar, no ischemia, EF 28 Myoview  04/17/2021: Large infarct, no ischemia, EF 18; high risk  Pharmacologic MPI 05/16/2024: LAD territory infarct, no ischemia, EF 34, high risk Chronic systolic CHF Ischemic CM TTE 05/01/2021: No LV thrombus, EF 25-30, G1 DD, apical anteroseptal/apical/lateral AK, normal RVSF, mild RAE  TTE 05/13/2024: EF 25-30, normal RVSF, trivial MR, anterior, septal, apical akinesis S/p AICD Hx of pulmonary embolism  Chronic Obstructive Pulmonary Disease RHC 12/11/2021: No pulmonary hypertension Chronic kidney disease IV Prior Gi bleeding  Hypertension  Hyperlipidemia  Thoracic aortic aneurysm S/p L carotid-subclavian transposition and endovascular repair of aneurysm 03/2018  CT 05/12/2024: Well-seated endovascular aortic stent graft in the arch and proximal descending aorta, no endoleak Aortic atherosclerosis        Discussed the use of AI scribe software for clinical note transcription with the patient, who gave verbal consent to proceed. History of Present Illness [name] is a 78 y.o. female for posthospitalization follow-up.  She was last seen in clinic 04/15/2024 by Dr. Fell.  She was admitted 10/16-10/17 with chest pain.  Troponins were minimally elevated and flat, not consistent with ACS.  Echocardiogram demonstrated stable LV function with EF 25-30 and similar wall motion abnormalities.  Bisoprolol  was changed to metoprolol  due to elevated heart rates.  Isosorbide  15 mg was added.  Outpatient stress test  was arranged.  This demonstrated LAD territory infarct but no ischemia.  Study was high risk due to low EF.  Medical therapy was recommended.  She experiences ongoing chest heaviness, particularly with activity, which requires her to sit down. The chest discomfort is described as a tightness located centrally in the chest, without radiation. No associated symptoms such as nausea, sweating, or pain radiating to the neck, arms, or back. The symptoms began last Friday, over a week ago, and have persisted since her hospital discharge. She experiences shortness of breath with minimal exertion and sometimes has difficulty breathing when lying flat, although this usually resolves. She reports occasional swelling in her feet, which she attributes to salt intake. She does not smoke.     ROS-See HPI    Studies Reviewed:      Labs - Chart Review 08/31/2023: Total cholesterol 133, HDL 56.7, LDL 56, triglycerides 102 89/76/7974: K 4.2, creatinine 1.85, eGFR 26.11, ALT 14, proBNP 205, Hgb 11.9, PLT 200K,         Physical Exam:  VS:  BP 126/72 (BP Location: Left Arm, Patient Position: Sitting, Cuff Size: Normal)   Pulse 80   Ht 5' 2 (1.575 m)   Wt 170 lb 3.2 oz (77.2 kg)   SpO2 96%   BMI 31.13 kg/m        Wt Readings from Last 3 Encounters:  05/25/24 170 lb 3.2 oz (77.2 kg)  05/19/24 168 lb 6.4 oz (76.4 kg)  05/13/24 166 lb 0.1 oz (75.3 kg)    Constitutional:      Appearance: Healthy appearance. Not in distress.  Neck:  Vascular: JVD normal.  Pulmonary:     Breath sounds: No wheezing. Bibasilar Rales present.  Cardiovascular:     Normal rate. Regular rhythm.     Murmurs: There is no murmur.  Edema:    Peripheral edema absent.  Abdominal:     Palpations: Abdomen is soft.       Assessment and Plan:    Assessment & Plan Coronary artery disease involving native coronary artery of native heart with angina pectoris History of anterior STEMI in 2010 treated with BMS to the LAD.  She has  had multiple PCI procedures since secondary to in-stent restenosis.  Her last cardiac catheterization in 2011 demonstrated patent LAD stent.  She was recently admitted to the hospital with chest pain.  Cardiac enzymes were elevated but not consistent with ACS.  She was seen by cardiology.  EF remained stable on echocardiogram.  Outpatient pharmacologic nuclear stress test demonstrated anterior scar but no ischemia.  Study was high risk due to low EF.  Medical therapy has been continued.  She continues to experience angina with exertion.  There is a risk of contrast-induced nephropathy due to chronic kidney disease if we decided to pursue cardiac catheterization.  We discussed the rationale for intensification of medical therapy.  This is an an effort to avoid cardiac catheterization if possible. - Increase metoprolol  to tartrate to 50 mg twice daily. - Continue Imdur  15 mg daily. - Continue aspirin  81 mg daily. - Continue rosuvastatin  20 mg daily. - Follow up in 3-4 weeks. - Consider cardiac catheterization if symptoms persist or worsen. HFrEF (heart failure with reduced ejection fraction) (HCC) Ischemic cardiomyopathy.  Recent echocardiogram with EF 25-30.  GDMT has been somewhat limited by chronic kidney disease.  Of note, bisoprolol  was changed in the hospital to metoprolol  due to elevated heart rates.  She reports shortness of breath with minimal exertion and she does have bibasilar rales on exam.  She recently decreased furosemide  to half a pill daily.  She notes her nephrologist may have wanted her to stop furosemide .  Her GDMT limited by chronic kidney disease. - Increase furosemide  to 40 mg daily for three days, then reduce to 20 mg daily. - Continue hydralazine  10 mg three times a day. - Continue Imdur  15 mg daily. - Increase metoprolol  tartrate at 50 mg twice daily. - If she has improved symptoms on higher dose furosemide , we can continue Thoracic aortic aneurysm without rupture, unspecified  part Status post endovascular repair in 2018.  CT scan done in the hospital demonstrated stable stent graft and no evidence of endoleak.  Mixed hyperlipidemia Recent LDL levels are optimal. - Continue rosuvastatin  20 mg daily. Stage 4 chronic kidney disease (HCC) As noted, we discussed the potential risk of contrast-induced nephropathy given her advanced chronic kidney disease.  Hopefully, we can avoid cardiac catheterization.        Dispo:  Return in about 4 weeks (around 06/22/2024) for Routine Follow Up, w/ Dr. Wonda, or Glendia Ferrier, PA-C.  Signed, Glendia Ferrier, PA-C

## 2024-05-25 NOTE — Assessment & Plan Note (Addendum)
 Ischemic cardiomyopathy.  Recent echocardiogram with EF 25-30.  GDMT has been somewhat limited by chronic kidney disease.  Of note, bisoprolol  was changed in the hospital to metoprolol  due to elevated heart rates.  She reports shortness of breath with minimal exertion and she does have bibasilar rales on exam.  She recently decreased furosemide  to half a pill daily.  She notes her nephrologist may have wanted her to stop furosemide .  Her GDMT limited by chronic kidney disease. - Increase furosemide  to 40 mg daily for three days, then reduce to 20 mg daily. - Continue hydralazine  10 mg three times a day. - Continue Imdur  15 mg daily. - Increase metoprolol  tartrate at 50 mg twice daily. - If she has improved symptoms on higher dose furosemide , we can continue

## 2024-05-25 NOTE — Assessment & Plan Note (Addendum)
 Recent LDL levels are optimal. - Continue rosuvastatin  20 mg daily.

## 2024-05-26 DIAGNOSIS — I252 Old myocardial infarction: Secondary | ICD-10-CM | POA: Insufficient documentation

## 2024-05-30 ENCOUNTER — Encounter: Payer: Self-pay | Admitting: Physical Medicine and Rehabilitation

## 2024-05-30 ENCOUNTER — Ambulatory Visit (INDEPENDENT_AMBULATORY_CARE_PROVIDER_SITE_OTHER): Admitting: Physical Medicine and Rehabilitation

## 2024-05-30 ENCOUNTER — Encounter: Payer: Self-pay | Admitting: Radiology

## 2024-05-30 DIAGNOSIS — M542 Cervicalgia: Secondary | ICD-10-CM

## 2024-05-30 DIAGNOSIS — M961 Postlaminectomy syndrome, not elsewhere classified: Secondary | ICD-10-CM

## 2024-05-30 DIAGNOSIS — M47812 Spondylosis without myelopathy or radiculopathy, cervical region: Secondary | ICD-10-CM

## 2024-05-30 NOTE — Progress Notes (Signed)
 [name] - 78 y.o. female MRN [id number]  Date of birth: 10/02/1945  Office Visit Note: Visit Date: 05/30/2024 PCP: Purcell Emil Schanz, MD Referred by: Purcell Emil Schanz, *  Subjective: Chief Complaint  Patient presents with   Neck - Pain   HPI: [name] is a 78 y.o. female who comes in today per the request of Dr. Joesph Likes for evaluation of chronic bilateral neck pain. Pain ongoing for several years, worsened over the last several months. Reports her pain is more intermittent, no specific aggravating factors. She describes pain as sharp and stabbing sensation, currently denies pain today. She reports difficulty with side to side rotation of her neck when pain is severe. Some relief of pain with home exercise regimen, ice, rest and use of medications. No history of dedicated physical therapy for her neck. She is currently managed by Dr. Likes from chronic pain standpoint, she is prescribed Tramadol . Recent cervical radiographs show C4-C5 ACDF, there is bulky adjacent segment disease at C3-C4, including endplate and asymmetric right facet hypertrophy. History of C4-C5 ACDF with Dr. [name] Helling in 2008. No history of cervical injections. No recent cervical MRI imaging. She is unable to undergo MRI imaging due to cardiac defibrillator. Patient denies focal weakness, numbness and tingling. No recent trauma or falls.       Review of Systems  Musculoskeletal:  Negative for myalgias and neck pain.  Neurological:  Negative for tingling, sensory change, focal weakness and weakness.  All other systems reviewed and are negative.  Otherwise per HPI.  Assessment & Plan: Visit Diagnoses:    ICD-10-CM   1. Cervicalgia  M54.2     2. Facet arthropathy, cervical  M47.812     3. Post laminectomy syndrome  M96.1        Plan: Findings:  Chronic axial bilateral neck pain. No radicular symptoms down the arms. Patient is doing much better today, her pain does seem to be more  intermittent. Recent cervical radiographs show adjacent segment disease at C3-C4 where there is C3-C4 facet hypertrophy, greater on the right. This could be a pain generator for her. Her pain does seem to be more facet mediated, most severe pain does cause discomfort with side to side rotation of neck. We discussed treatment plan in detail today. Would be willing to try diagnostic and hopefully therapeutic bilateral C3-C4 facet joint injections, however would wait until her pain returns. I did go ahead and place order for CT imaging of cervical spine. She is unable to have MRI imaging due to ICD. I will contact her with CT cervical spine results, should her pain return would move forward with cervical facet joint injections. She has no questions at this time. No red flag symptoms noted upon exam today.      Meds & Orders: No orders of the defined types were placed in this encounter.  No orders of the defined types were placed in this encounter.   Follow-up: Return for review of CT cervical spine.   Procedures: No procedures performed      Clinical History: Narrative & Impression CLINICAL DATA:  78 year old female with neck pain greater on the left, headaches for 3 months. Prior surgery.   EXAM: DG CERVICAL SPINE 2 OR 3 VIEWS   COMPARISON:  Preoperative cervical spine MRI 05/29/2007. Intraoperative images 125 2008.   FINDINGS: Three views. Prevertebral soft tissue contours within normal limits. C4-C5 cervical ACDF, hardware appears intact and evidence of interbody arthrodesis on the lateral view.  Advanced disc space loss C5-C6 and C6-C7. Degenerative endplate spurring at those levels, bulky endplate spurring at the C3-C4 adjacent segment with mild to moderate disc space loss. C3-C4 facet hypertrophy also greater on the right. Maintained C1-C2 alignment and joint spaces. No acute osseous abnormality identified.   Partially visible thoracic aortic endograft, left chest pacemaker type  device. Negative lung apices.   IMPRESSION: 1. C4-C5 ACDF with no adverse features. 2. Bulky adjacent segment disease at C3-C4, including endplate and asymmetric right facet hypertrophy. Advanced chronic disc degeneration C5-C6 and C6-C7.     Electronically Signed   By: VEAR Hurst M.D.   On: 04/26/2024 06:02   She reports that she has quit smoking. Her smoking use included cigarettes. She has a 3.9 pack-year smoking history. She has never used smokeless tobacco.  Recent Labs    08/31/23 1351  HGBA1C 6.6*    Objective:  VS:  HT:    WT:   BMI:     BP:   HR: bpm  TEMP: ( )  RESP:  Physical Exam Vitals and nursing note reviewed.  HENT:     Head: Normocephalic and atraumatic.     Right Ear: External ear normal.     Left Ear: External ear normal.     Nose: Nose normal.     Mouth/Throat:     Mouth: Mucous membranes are moist.  Eyes:     Extraocular Movements: Extraocular movements intact.  Cardiovascular:     Rate and Rhythm: Normal rate.     Pulses: Normal pulses.  Pulmonary:     Effort: Pulmonary effort is normal.  Abdominal:     General: Abdomen is flat. There is no distension.  Musculoskeletal:        General: No tenderness.     Cervical back: Normal range of motion.     Comments: No discomfort noted with flexion, extension and side-to-side rotation. Patient has good strength in the upper extremities including 5 out of 5 strength in wrist extension, long finger flexion and APB. Shoulder range of motion is full bilaterally without any sign of impingement. There is no atrophy of the hands intrinsically. Sensation intact bilaterally. Negative Hoffman's sign. Negative Spurling's sign.     Skin:    General: Skin is warm and dry.     Capillary Refill: Capillary refill takes less than 2 seconds.  Neurological:     General: No focal deficit present.     Mental Status: She is alert and oriented to person, place, and time.  Psychiatric:        Mood and Affect: Mood normal.         Behavior: Behavior normal.     Ortho Exam  Imaging: No results found.  Past Medical/Family/Surgical/Social History: Medications & Allergies reviewed per EMR, new medications updated. Patient Active Problem List   Diagnosis Date Noted   History of myocardial infarction 05/26/2024   Chest heaviness 05/12/2024   History of coronary artery stent placement 05/12/2024   Hypertensive heart and kidney disease with HF and with CKD stage IV (HCC) 05/12/2024   Mixed hyperlipidemia 05/12/2024   Chronic heart failure with reduced ejection fraction (HFrEF, <= 40%) (HCC) 05/12/2024   Atherosclerotic heart disease 05/12/2024   S/P cervical spinal fusion 03/16/2024   Cervicalgia 03/16/2024   Granulation of postmastoidectomy cavity of left side 01/03/2024   Left-sided tinnitus 01/03/2024   Myofascial pain 08/10/2023   Chronic pain syndrome 05/20/2023   Encounter for long-term opiate analgesic use 05/20/2023  Constipation 05/12/2023   Family history of colon cancer in mother 05/12/2023   Bilateral hip pain 04/16/2023   Stage 4 chronic kidney disease (HCC) 11/25/2022   UTI due to Klebsiella species 05/08/2022   Chronic bilateral low back pain without sciatica 05/08/2022   Spinal stenosis of lumbar region without neurogenic claudication 05/08/2022   Otalgia of left ear 03/19/2022   HFrEF (heart failure with reduced ejection fraction) (HCC) 12/02/2021   Stage 3b chronic kidney disease (HCC) 08/08/2021   Chronic insomnia 11/14/2019   Colon polyp 06/16/2018   Thoracic aortic aneurysm without rupture 03/31/2018   Chronic renal insufficiency    AICD (automatic cardioverter/defibrillator) present    Ischemic cardiomyopathy    COPD (chronic obstructive pulmonary disease) (HCC)    Dyslipidemia    Insomnia    Pre-diabetes 06/19/2017   Gastric polyp    Osteoporosis 05/11/2013   Hearing loss 04/29/2013   Mild cognitive impairment 04/29/2013   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/05/2012    Implantable cardioverter-defibrillator (ICD) in situ 08/06/2011   DDD (degenerative disc disease), lumbar 05/16/2011   History of pulmonary embolus (PE) 01/02/2011   UNSPECIFIED ANEMIA 07/03/2010   Essential hypertension 09/05/2009   SOB (shortness of breath) 08/30/2009   DIVERTICULOSIS-COLON 08/14/2009   History of colonic polyps 04/24/2009   TOBACCO ABUSE 04/04/2009   Coronary artery disease involving native coronary artery of native heart with angina pectoris 02/25/2009   GERD 10/19/2007   IBS 10/19/2007   COLONIC POLYPS, ADENOMATOUS 05/12/2007   Past Medical History:  Diagnosis Date   Abdominal pain, left lower quadrant 08/14/2009   Qualifier: Diagnosis of  By: Drucilla BANANA, Pam     Abnormal CT scan, stomach    Abnormal LFTs 06/19/2017   AICD (automatic cardioverter/defibrillator) present    Dr.Allred follows   Aortic arch pseudoaneurysm    a. followed by Dr. Fleeta Ochoa.   Arthritis    Asthma    Benign neoplasm of colon    CAD (coronary artery disease) 02/2009   a. anterior STEMI rx with BMS to prox LAD in 02/2009. b. ISR s/p PTCA 06/2009. c. ISR s/p thrombectomy & PTCA 03/2010 due to late stent thrombosis. // Myoview  04/2019: EF 28, ant, ant-sept, inf-sept scar, no ischemia, high risk (stable>>cont med Rx)    Cardiomyopathy, ischemic 06/12/2011   Chronic renal insufficiency    stage 3   Chronic systolic CHF (congestive heart failure) (HCC)    a. s/p ST. Jude ICD 06-26-12.   Chronic systolic heart failure (HCC) 08/05/2011   Colon polyp    Complication of anesthesia    COPD (chronic obstructive pulmonary disease) (HCC)    CORONARY ARTERY DISEASE 04/24/2009   Qualifier: Diagnosis of  By: Ever Riggers, Amy S    DDD (degenerative disc disease), lumbar 05/16/2011   Depression    since my son died in 11/25/23 2018/06/26)   Diverticulosis    DIVERTICULOSIS-COLON 08/14/2009   Qualifier: Diagnosis of  By: Ever PA-c, Amy S    DYSPHAGIA UNSPECIFIED 04/24/2009   Qualifier: Diagnosis of   By: Drucilla BANANA, Pam     Dyspnea    when I lay down at night    Essential hypertension 09/05/2009   Qualifier: Diagnosis of  By: Thalia CMA, Jennifer     Gastric polyp    GERD 10/19/2007   Qualifier: Diagnosis of  By: Kimble RN, [place]: Diagnosis of  By: Drucilla BANANA, Pam     GERD (gastroesophageal reflux disease)    GI bleed  a. h/o GIB on DAPT, now on ASA only.   Headache 01/03/2013   Hearing loss    left ear   History of pulmonary embolus (PE) 01/02/2011   Hyperlipemia    Hyperlipidemia, unspecified 04/24/2009   Qualifier: Diagnosis of  By: Ever PA-c, Amy S    Hypertension    ICD-St.Jude 08/06/2011   S/p St. Jude ICD placement 08/05/11    Insomnia    Irritable bowel syndrome    Ischemic cardiomyopathy    EF 10-15%   Memory loss    Migraine headache without aura    Myocardial infarct (HCC) 2011 x 2   Dr. Cooper,cardiology    PERSONAL HX COLONIC POLYPS 04/24/2009   Qualifier: Diagnosis of  By: Ever Riggers, Amy S November, 2011 colonoscopy demonstrated a sessile cecal polyp and sigmoid polyp    Pneumonia    PONV (postoperative nausea and vomiting)    Pre-diabetes    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/05/2012   Pulmonary embolism (HCC) 2011   Stroke [facility])    'When I was young. no residual   Thoracic aortic aneurysm 03/31/2018   TOBACCO ABUSE 04/04/2009   Qualifier: Diagnosis of  By: Parthenia RIGGERS Olivia Mason  Qualifier: Diagnosis of  By: Morris, MD, CODY Debby Lenis    Transfusion history    ?'12 or '13   UNSPECIFIED ANEMIA 07/03/2010   Qualifier: Diagnosis of  By: Arlyss Math     Unspecified mastoiditis    Family History  Problem Relation Age of Onset   Colon cancer Mother    Other Brother        tumor in his brain   Bladder Cancer Brother    Throat cancer Brother    Cancer Son        kidney, spread to his liver   Breast cancer Cousin    Past Surgical History:  Procedure Laterality Date   ANGIOPLASTY  07/02/09, 04/01/10   BILATERAL  SALPINGOOPHORECTOMY     CAD( bare metal stent)  02/2009   x 1   CAROTID-SUBCLAVIAN BYPASS GRAFT Left 03/31/2018   Procedure: LEFT SUBCLAVIAN ARTERY BYPASS GRAFT;  Surgeon: Serene Gaile ORN, MD;  Location: [facility] OR;  Service: Vascular;  Laterality: Left;   CERVICAL SPINE SURGERY  08/08   COLONOSCOPY WITH PROPOFOL  N/A 04/29/2016   Procedure: COLONOSCOPY WITH PROPOFOL ;  Surgeon: Elspeth Deward Naval, MD;  Location: [facility];  Service: Gastroenterology;  Laterality: N/A;   ESOPHAGOGASTRODUODENOSCOPY N/A 12/09/2016   Procedure: ESOPHAGOGASTRODUODENOSCOPY (EGD);  Surgeon: Naval Elspeth Deward, MD;  Location: [facility];  Service: Gastroenterology;  Laterality: N/A;   ESOPHAGOGASTRODUODENOSCOPY (EGD) WITH PROPOFOL  N/A 04/29/2016   Procedure: ESOPHAGOGASTRODUODENOSCOPY (EGD) WITH PROPOFOL ;  Surgeon: Elspeth Deward Naval, MD;  Location: [facility];  Service: Gastroenterology;  Laterality: N/A;   EUS N/A 05/15/2016   Procedure: UPPER ENDOSCOPIC ULTRASOUND (EUS) RADIAL;  Surgeon: Toribio SHAUNNA Cedar, MD;  Location: [facility];  Service: Endoscopy;  Laterality: N/A;   ICD GENERATOR CHANGEOUT N/A 11/04/2022   Procedure: ICD GENERATOR CHANGEOUT;  Surgeon: Nancey, Eulas BRAVO, MD;  Location: [facility] INVASIVE CV LAB;  Service: Cardiovascular;  Laterality: N/A;   IMPLANTABLE CARDIOVERTER DEFIBRILLATOR IMPLANT N/A 08/05/2011   Primary prevention SJM ICD implanted,  Analyze ST study patient   INNER EAR SURGERY     left x 17   LUMBAR DISC SURGERY  02/2008   fusion   NASAL SEPTUM SURGERY     RIGHT HEART CATH N/A 12/11/2021   Procedure: RIGHT HEART CATH;  Surgeon: Wonda Sharper, MD;  Location: [facility] INVASIVE CV LAB;  Service: Cardiovascular;  Laterality: N/A;   THORACIC AORTIC ENDOVASCULAR STENT GRAFT N/A 03/31/2018   Procedure: THORACIC AORTIC ENDOVASCULAR STENT GRAFT;  Surgeon: Serene Gaile ORN, MD;  Location: [facility] OR;  Service: Vascular;  Laterality: N/A;   TOTAL ABDOMINAL HYSTERECTOMY     complete   Social History    Occupational History   Occupation: Designer, Industrial/product: UNEMPLOYED    Employer: DISABLED  Tobacco Use   Smoking status: Former    Current packs/day: 0.13    Average packs/day: 0.1 packs/day for 30.0 years (3.9 ttl pk-yrs)    Types: Cigarettes   Smokeless tobacco: Never  Vaping Use   Vaping status: Never Used  Substance and Sexual Activity   Alcohol use: No    Alcohol/week: 0.0 standard drinks of alcohol   Drug use: No   Sexual activity: Not Currently

## 2024-05-30 NOTE — Progress Notes (Signed)
 Pain Scale   Average Pain 0 Patient advised she has neck pain that comes and goes without warning.        +Driver, -BT, -Dye Allergies.

## 2024-06-15 ENCOUNTER — Encounter: Payer: Self-pay | Admitting: *Deleted

## 2024-06-15 ENCOUNTER — Other Ambulatory Visit: Payer: Self-pay | Admitting: Cardiovascular Disease

## 2024-06-16 NOTE — Progress Notes (Signed)
 OFFICE NOTE:    Date:  06/17/2024  ID:  [name], DOB 21-Jun-1946, MRN [id number] PCP: Purcell Emil Schanz, MD  [facility] Providers Cardiologist:  Ozell Fell, MD Electrophysiologist:  Eulas FORBES Furbish, MD        Coronary artery disease  S/p anterior MI in 2010 >> PCI: BMS to prox LAD S/p multiple PCIs since MI in 2010 2/2 ISR LHC 06/2010: prox LAD stent ok with 30-50% ISR, prox to mid RCA 30-40%.  Myoview  04/2019: Lg scar, no ischemia, EF 28 Myoview  04/17/2021: Large infarct, no ischemia, EF 18; high risk  Pharmacologic MPI 05/16/2024: LAD territory infarct, no ischemia, EF 34, high risk Chronic systolic CHF Ischemic CM TTE 05/01/2021: No LV thrombus, EF 25-30, G1 DD, apical anteroseptal/apical/lateral AK, normal RVSF, mild RAE  TTE 05/13/2024: EF 25-30, normal RVSF, trivial MR, anterior, septal, apical akinesis S/p AICD Hx of pulmonary embolism  Chronic Obstructive Pulmonary Disease RHC 12/11/2021: No pulmonary hypertension Chronic kidney disease IV Prior Gi bleeding  Hypertension  Hyperlipidemia  Thoracic aortic aneurysm S/p L carotid-subclavian transposition and endovascular repair of aneurysm 03/2018  CT 05/12/2024: Well-seated endovascular aortic stent graft in the arch and proximal descending aorta, no endoleak Aortic atherosclerosis         Discussed the use of AI scribe software for clinical note transcription with the patient, who gave verbal consent to proceed. History of Present Illness [name] is a 78 y.o. female for follow up of CAD. She was last seen 05/25/24.  She was admitted 10/16-10/17 with chest pain.  Troponins were minimally elevated and flat, not consistent with ACS.  Echocardiogram demonstrated stable LV function with EF 25-30 and similar wall motion abnormalities.  Bisoprolol  was changed to metoprolol  due to elevated heart rates.  Isosorbide  15 mg was added.  Outpatient stress test was arranged.  This demonstrated LAD  territory infarct but no ischemia.  Study was high risk due to low EF.  Medical therapy was recommended.   At last visit, she was still having anginal symptoms. She is at high risk for CIN due to chronic kidney disease. It is best to avoid cardiac catheterization if possible. I adjusted her metoprolol  to 50 mg twice daily.   She experiences ongoing angina symptoms, which have improved since her last visit. Chest tightness occurs primarily during long-distance walking, such as visiting her brother in the hospital.  Her symptoms have improved with daily activities like sweeping floors. No syncope or swelling is reported. No orthopnea is reported, and breathing is 'a little bit better' when lying flat. She plans to spend Thanksgiving locally with her son in [place].    ROS-See HPI    Studies Reviewed:      Results Creatinine clearance: 31          Physical Exam:  VS:  BP 126/78   Pulse 92   Ht 5' 2 (1.575 m)   Wt 167 lb 12.8 oz (76.1 kg)   SpO2 94%   BMI 30.69 kg/m        Wt Readings from Last 3 Encounters:  06/17/24 167 lb 12.8 oz (76.1 kg)  05/25/24 170 lb 3.2 oz (77.2 kg)  05/19/24 168 lb 6.4 oz (76.4 kg)    Constitutional:      Appearance: Healthy appearance. Not in distress.  Pulmonary:     Breath sounds: Normal breath sounds. No wheezing. No rales.  Cardiovascular:     Normal rate. Regular rhythm.     Murmurs:  There is no murmur.  Edema:    Peripheral edema absent.       Assessment and Plan:    Assessment & Plan Coronary artery disease involving native coronary artery of native heart with angina pectoris History of anterior STEMI in 2010 treated with BMS to the LAD.  She has had multiple PCI procedures since secondary to in-stent restenosis.  Her last cardiac catheterization in 2011 demonstrated patent LAD stent.  She was admitted to the hospital in 04/2024 with chest pain.  Cardiac enzymes were elevated but not consistent with ACS and outpatient pharmacologic  nuclear stress test demonstrated anterior scar but no ischemia.  Study was high risk due to low EF.  Medical therapy has been continued.  We have tried to avoid cardiac catheterization given chronic kidney disease and high risk for contrast-induced nephropathy.  At last visit, I increased her beta-blocker to advance med Rx due to ongoing angina. Anginal symptoms have improved with current medication regimen.   unless symptoms become unstable. - Continue metoprolol  tartrate 50 mg twice daily - Continue rosuvastatin  20 mg daily, ASA 81 mg daily - Increase isosorbide  mononitrate to 30 mg daily - Follow-up 6 months HFrEF (heart failure with reduced ejection fraction) (HCC) Ischemic cardiomyopathy.  Recent echocardiogram with EF 25-30.  GDMT has been somewhat limited by chronic kidney disease.  Volume status is stable. NYHA class IIb-III. - Continue hydralazine  10 mg three times a day - Increase isosorbide  mononitrate to 30 mg daily - Continue metoprolol  to tartrate 50 mg twice daily Stage 4 chronic kidney disease (HCC) Chronic kidney disease stage 4 with recent GFR of 26 and creatinine clearance of 31. Therefore, I do not think we can try Ranolazine for her in the future.         Dispo:  Return in about 6 months (around 12/15/2024) for Routine Follow Up, w/ Dr. Wonda.  Signed, Glendia Ferrier, PA-C

## 2024-06-16 NOTE — Assessment & Plan Note (Addendum)
 Ischemic cardiomyopathy.  Recent echocardiogram with EF 25-30.  GDMT has been somewhat limited by chronic kidney disease.  Volume status is stable. NYHA class IIb-III. - Continue hydralazine  10 mg three times a day - Increase isosorbide  mononitrate to 30 mg daily - Continue metoprolol  to tartrate 50 mg twice daily

## 2024-06-16 NOTE — Assessment & Plan Note (Addendum)
 History of anterior STEMI in 2010 treated with BMS to the LAD.  She has had multiple PCI procedures since secondary to in-stent restenosis.  Her last cardiac catheterization in 2011 demonstrated patent LAD stent.  She was admitted to the hospital in 04/2024 with chest pain.  Cardiac enzymes were elevated but not consistent with ACS and outpatient pharmacologic nuclear stress test demonstrated anterior scar but no ischemia.  Study was high risk due to low EF.  Medical therapy has been continued.  We have tried to avoid cardiac catheterization given chronic kidney disease and high risk for contrast-induced nephropathy.  At last visit, I increased her beta-blocker to advance med Rx due to ongoing angina. Anginal symptoms have improved with current medication regimen.   unless symptoms become unstable. - Continue metoprolol  tartrate 50 mg twice daily - Continue rosuvastatin  20 mg daily, ASA 81 mg daily - Increase isosorbide  mononitrate to 30 mg daily - Follow-up 6 months

## 2024-06-17 ENCOUNTER — Ambulatory Visit: Attending: Physician Assistant | Admitting: Physician Assistant

## 2024-06-17 ENCOUNTER — Encounter: Payer: Self-pay | Admitting: Physician Assistant

## 2024-06-17 VITALS — BP 126/78 | HR 92 | Ht 62.0 in | Wt 167.8 lb

## 2024-06-17 DIAGNOSIS — I5020 Unspecified systolic (congestive) heart failure: Secondary | ICD-10-CM

## 2024-06-17 DIAGNOSIS — I25119 Atherosclerotic heart disease of native coronary artery with unspecified angina pectoris: Secondary | ICD-10-CM | POA: Diagnosis not present

## 2024-06-17 DIAGNOSIS — N184 Chronic kidney disease, stage 4 (severe): Secondary | ICD-10-CM | POA: Diagnosis not present

## 2024-06-17 MED ORDER — ISOSORBIDE MONONITRATE ER 30 MG PO TB24: 30.0000 mg | ORAL_TABLET | Freq: Every day | ORAL | 3 refills | Status: AC

## 2024-06-17 NOTE — Assessment & Plan Note (Signed)
 Chronic kidney disease stage 4 with recent GFR of 26 and creatinine clearance of 31. Therefore, I do not think we can try Ranolazine for her in the future.

## 2024-06-17 NOTE — Patient Instructions (Signed)
 Medication Instructions:  Increase Isosorbide  mononitrate (Imdur ) to 30 mg once daily (whole tablet)  *If you need a refill on your cardiac medications before your next appointment, please call your pharmacy*    Follow-Up: At [facility], you and your health needs are our priority.  As part of our continuing mission to provide you with exceptional heart care, our providers are all part of one team.  This team includes your primary Cardiologist (physician) and Advanced Practice Providers or APPs (Physician Assistants and Nurse Practitioners) who all work together to provide you with the care you need, when you need it.  Your next appointment:   6 month(s)  Provider:   Ozell Fell, MD    We recommend signing up for the patient portal called MyChart.  Sign up information is provided on this After Visit Summary.  MyChart is used to connect with patients for Virtual Visits (Telemedicine).  Patients are able to view lab/test results, encounter notes, upcoming appointments, etc.  Non-urgent messages can be sent to your provider as well.   To learn more about what you can do with MyChart, go to [email or web address].   Other Instructions

## 2024-06-28 ENCOUNTER — Ambulatory Visit: Admitting: Physician Assistant

## 2024-06-29 ENCOUNTER — Ambulatory Visit: Payer: Self-pay

## 2024-06-29 NOTE — Telephone Encounter (Signed)
 FYI Only or Action Required?: FYI only for provider: appointment scheduled on 06/30/24.  Patient was last seen in primary care on 05/19/2024 by Alvia Corean CROME, FNP.  Called Nurse Triage reporting Nasal Congestion.  Symptoms began a week ago.  Interventions attempted: OTC medications: tylenol ; nasal spray.  Symptoms are: gradually worsening.  Triage Disposition: See PCP When Office is Open (Within 3 Days)  Patient/caregiver understands and will follow disposition?: Yes  Copied from CRM [id number]. Topic: Clinical - Red Word Triage >> Jun 29, 2024  4:51 PM Sophia H wrote: Red Word that prompted transfer to Nurse Triage: Patient is experiencing severe headache/cough. Reason for Disposition  [1] Sinus congestion (pressure, fullness) AND [2] present > 10 days  Answer Assessment - Initial Assessment Questions Pt called in with audible congestion and reports constant sinus dripping down the back of her throat. She reports symptoms started with a sore throat 1 week ago but congestion has continued while other symptoms have resolved. Pt is using tylenol  and nasal spray but reporting her nose is starting to get irritated and raw resulting in blood tinge to mucous. Pt denies fever, body aches or difficulty breathing. Appointment scheduled for evaluation. Patient agrees with plan of care, and will call back if anything changes, or if symptoms worsen.     1. LOCATION: Where does it hurt?      Headache; more irritating with nasal drainage down throat   2. ONSET: When did the sinus pain start?  (e.g., hours, days)      1 week ago   3. SEVERITY: How bad is the pain?   (Scale 0-10; or none, mild, moderate or severe)     Moderate; comes and goes   4. RECURRENT SYMPTOM: Have you ever had sinus problems before? If Yes, ask: When was the last time? and What happened that time?      Yes; in the winter I usually get this   5. NASAL CONGESTION: Is the nose blocked? If Yes, ask: Can  you open it or must you breathe through your mouth?     Yes; able to blow nose to clear  6. NASAL DISCHARGE: Do you have discharge from your nose? If so ask, What color?     Yes; yellow mucous with blood d/t irritation   7. FEVER: Do you have a fever? If Yes, ask: What is it, how was it measured, and when did it start?      None   8. OTHER SYMPTOMS: Do you have any other symptoms? (e.g., sore throat, cough, earache, difficulty breathing)      Cough present; sore throat has resolved  Protocols used: Sinus Pain or Congestion-A-AH

## 2024-06-30 ENCOUNTER — Encounter: Payer: Self-pay | Admitting: Emergency Medicine

## 2024-06-30 ENCOUNTER — Ambulatory Visit: Admitting: Emergency Medicine

## 2024-06-30 VITALS — BP 140/80 | HR 100 | Temp 98.4°F | Ht 62.0 in | Wt 163.0 lb

## 2024-06-30 DIAGNOSIS — R0981 Nasal congestion: Secondary | ICD-10-CM | POA: Insufficient documentation

## 2024-06-30 DIAGNOSIS — E0822 Diabetes mellitus due to underlying condition with diabetic chronic kidney disease: Secondary | ICD-10-CM | POA: Insufficient documentation

## 2024-06-30 DIAGNOSIS — Z95811 Presence of heart assist device: Secondary | ICD-10-CM | POA: Insufficient documentation

## 2024-06-30 DIAGNOSIS — J019 Acute sinusitis, unspecified: Secondary | ICD-10-CM | POA: Diagnosis not present

## 2024-06-30 DIAGNOSIS — B9689 Other specified bacterial agents as the cause of diseases classified elsewhere: Secondary | ICD-10-CM | POA: Insufficient documentation

## 2024-06-30 DIAGNOSIS — N184 Chronic kidney disease, stage 4 (severe): Secondary | ICD-10-CM

## 2024-06-30 MED ORDER — GUAIFENESIN ER 600 MG PO TB12: 600.0000 mg | ORAL_TABLET | Freq: Two times a day (BID) | ORAL | 0 refills | Status: AC

## 2024-06-30 MED ORDER — AMOXICILLIN-POT CLAVULANATE 875-125 MG PO TABS: 1.0000 | ORAL_TABLET | Freq: Two times a day (BID) | ORAL | 0 refills | Status: AC

## 2024-06-30 MED ORDER — FLUTICASONE PROPIONATE 50 MCG/ACT NA SUSP: 2.0000 | Freq: Every day | NASAL | 6 refills | Status: AC

## 2024-06-30 NOTE — Assessment & Plan Note (Signed)
 Lab Results  Component Value Date   HGBA1C 6.6 (H) 08/31/2023  Chronic stable condition No concerns.  Diet controlled

## 2024-06-30 NOTE — Progress Notes (Signed)
 [name] 78 y.o.   Chief Complaint  Patient presents with   Nasal Congestion    HISTORY OF PRESENT ILLNESS: Acute problem visit today. This is a 78 y.o. female complaining of cold symptoms for 2 weeks that do not want to go away. Mostly complaining of nasal and sinus congestion No other associated symptoms No other complaints or medical concerns today.  HPI   Prior to Admission medications   Medication Sig Start Date End Date Taking? Authorizing Provider  acetaminophen  (TYLENOL ) 500 MG tablet Take 2 tablets (1,000 mg total) by mouth every 6 (six) hours as needed (back pain.). 08/11/22  Yes Redwine, Madison A, PA-C  amitriptyline  (ELAVIL ) 50 MG tablet TAKE 1 TABLET (50 MG TOTAL) BY MOUTH AT BEDTIME. 02/15/24  Yes Winta Barcelo, Emil Schanz, MD  aspirin  81 MG EC tablet Take 81 mg by mouth at bedtime.   Yes [provider]  ciprofloxacin  (CIPRO ) 250 MG tablet Take 250 mg by mouth 2 (two) times daily. 05/18/24  Yes [provider]  furosemide  (LASIX ) 40 MG tablet Take 1 tablet (40 mg total) by mouth daily. 12/09/21  Yes Weaver, Scott T, PA-C  hydrALAZINE  (APRESOLINE ) 10 MG tablet TAKE 1 TABLET BY MOUTH 3 TIMES DAILY. KEEP UPCOMING APPOINTMENT FOR REFILLS 04/19/24  Yes Wonda Sharper, MD  isosorbide  mononitrate (IMDUR ) 30 MG 24 hr tablet Take 1 tablet (30 mg total) by mouth daily. 06/17/24  Yes Weaver, Scott T, PA-C  metoprolol  tartrate (LOPRESSOR ) 25 MG tablet Take 2 tablets (50 mg total) by mouth 2 (two) times daily. 05/25/24 06/30/24 Yes Weaver, Scott T, PA-C  nitroGLYCERIN  (NITROSTAT ) 0.4 MG SL tablet Place 1 tablet (0.4 mg total) under the tongue every 5 (five) minutes as needed for chest pain. 05/13/24  Yes Cindy Garnette POUR, MD  polyethylene glycol powder (GLYCOLAX /MIRALAX ) powder Take 17 g by mouth daily as needed for mild constipation. 07/08/17  Yes Melonie Colonel, Mikel HERO, MD  rosuvastatin  (CRESTOR ) 20 MG tablet TAKE 1 TABLET (20 MG TOTAL) BY MOUTH AT BEDTIME. KEEP  UPCOMING APPOINTMENT FOR REFILLS 06/17/24  Yes Wonda Sharper, MD  STIOLTO RESPIMAT  2.5-2.5 MCG/ACT AERS INHALE 2 PUFFS INTO THE LUNGS DAILY. NEED APPOINTMENT FOR FURTHER REFILLS Patient taking differently: as needed. 02/13/23  Yes Hunsucker, Donnice SAUNDERS, MD  topiramate  (TOPAMAX ) 50 MG tablet TAKE 1 TABLET (50 MG TOTAL) BY MOUTH DAILY. 11/17/23  Yes Oleda Borski, Emil Schanz, MD  traMADol  (ULTRAM ) 50 MG tablet Take 1 tablet (50 mg total) by mouth every 12 (twelve) hours as needed. Can use up to twice daily if needed; do not exceed 30 tabs in 1 month Patient taking differently: Take 50 mg by mouth every 12 (twelve) hours as needed for moderate pain (pain score 4-6). 03/16/24  Yes Emeline Search C, DO    Allergies  Allergen Reactions   Sulfa Antibiotics Other (See Comments)    Unknown, childhood allergy    Sulfonamide Derivatives Other (See Comments)    UNSURE   Zestril  [Lisinopril ] Cough    Patient Active Problem List   Diagnosis Date Noted   History of myocardial infarction 05/26/2024   Chest heaviness 05/12/2024   History of coronary artery stent placement 05/12/2024   Hypertensive heart and kidney disease with HF and with CKD stage IV (HCC) 05/12/2024   Mixed hyperlipidemia 05/12/2024   Chronic heart failure with reduced ejection fraction (HFrEF, <= 40%) (HCC) 05/12/2024   Atherosclerotic heart disease 05/12/2024   S/P cervical spinal fusion 03/16/2024   Cervicalgia 03/16/2024   Granulation  of postmastoidectomy cavity of left side 01/03/2024   Left-sided tinnitus 01/03/2024   Myofascial pain 08/10/2023   Chronic pain syndrome 05/20/2023   Encounter for long-term opiate analgesic use 05/20/2023   Constipation 05/12/2023   Family history of colon cancer in mother 05/12/2023   Bilateral hip pain 04/16/2023   Stage 4 chronic kidney disease (HCC) 11/25/2022   UTI due to Klebsiella species 05/08/2022   Chronic bilateral low back pain without sciatica 05/08/2022   Spinal stenosis of lumbar  region without neurogenic claudication 05/08/2022   Otalgia of left ear 03/19/2022   HFrEF (heart failure with reduced ejection fraction) (HCC) 12/02/2021   Stage 3b chronic kidney disease (HCC) 08/08/2021   Chronic insomnia 11/14/2019   Colon polyp 06/16/2018   Thoracic aortic aneurysm without rupture 03/31/2018   Chronic renal insufficiency    AICD (automatic cardioverter/defibrillator) present    Ischemic cardiomyopathy    COPD (chronic obstructive pulmonary disease) (HCC)    Dyslipidemia    Insomnia    Pre-diabetes 06/19/2017   Gastric polyp    Osteoporosis 05/11/2013   Hearing loss 04/29/2013   Mild cognitive impairment 04/29/2013   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/05/2012   Implantable cardioverter-defibrillator (ICD) in situ 08/06/2011   DDD (degenerative disc disease), lumbar 05/16/2011   History of pulmonary embolus (PE) 01/02/2011   UNSPECIFIED ANEMIA 07/03/2010   Essential hypertension 09/05/2009   SOB (shortness of breath) 08/30/2009   DIVERTICULOSIS-COLON 08/14/2009   History of colonic polyps 04/24/2009   TOBACCO ABUSE 04/04/2009   Coronary artery disease involving native coronary artery of native heart with angina pectoris 02/25/2009   GERD 10/19/2007   IBS 10/19/2007   COLONIC POLYPS, ADENOMATOUS 05/12/2007    Past Medical History:  Diagnosis Date   Abdominal pain, left lower quadrant 08/14/2009   Qualifier: Diagnosis of  By: Drucilla BANANA, Pam     Abnormal CT scan, stomach    Abnormal LFTs 06/19/2017   AICD (automatic cardioverter/defibrillator) present    Dr.Allred follows   Aortic arch pseudoaneurysm    a. followed by Dr. Fleeta Ochoa.   Arthritis    Asthma    Benign neoplasm of colon    CAD (coronary artery disease) 02/2009   a. anterior STEMI rx with BMS to prox LAD in 02/2009. b. ISR s/p PTCA Jul 11, 2009. c. ISR s/p thrombectomy & PTCA 03/2010 due to late stent thrombosis. // Myoview  04/2019: EF 28, ant, ant-sept, inf-sept scar, no ischemia, high risk  (stable>>cont med Rx)    Cardiomyopathy, ischemic 06/12/2011   Chronic renal insufficiency    stage 3   Chronic systolic CHF (congestive heart failure) (HCC)    a. s/p ST. Jude ICD 07/11/12.   Chronic systolic heart failure (HCC) 08/05/2011   Colon polyp    Complication of anesthesia    COPD (chronic obstructive pulmonary disease) (HCC)    CORONARY ARTERY DISEASE 04/24/2009   Qualifier: Diagnosis of  By: Ever Riggers, Amy S    DDD (degenerative disc disease), lumbar 05/16/2011   Depression    since my son died in 11/10/23 2018-07-11)   Diverticulosis    DIVERTICULOSIS-COLON 08/14/2009   Qualifier: Diagnosis of  By: Ever Riggers, Amy S    DYSPHAGIA UNSPECIFIED 04/24/2009   Qualifier: Diagnosis of  By: Drucilla BANANA, Pam     Dyspnea    when I lay down at night    Essential hypertension 09/05/2009   Qualifier: Diagnosis of  By: Thalia CMA, Jennifer     Gastric polyp    GERD  10/19/2007   Qualifier: Diagnosis of  By: Kimble RN, [place]: Diagnosis of  By: Drucilla BANANA, Pam     GERD (gastroesophageal reflux disease)    GI bleed    a. h/o GIB on DAPT, now on ASA only.   Headache 01/03/2013   Hearing loss    left ear   History of pulmonary embolus (PE) 01/02/2011   Hyperlipemia    Hyperlipidemia, unspecified 04/24/2009   Qualifier: Diagnosis of  By: Ever PA-c, Amy S    Hypertension    ICD-St.Jude 08/06/2011   S/p St. Jude ICD placement 08/05/11    Insomnia    Irritable bowel syndrome    Ischemic cardiomyopathy    EF 10-15%   Memory loss    Migraine headache without aura    Myocardial infarct (HCC) 2011 x 2   Dr. Cooper,cardiology    PERSONAL HX COLONIC POLYPS 04/24/2009   Qualifier: Diagnosis of  By: Ever Riggers, Amy S November, 2011 colonoscopy demonstrated a sessile cecal polyp and sigmoid polyp    Pneumonia    PONV (postoperative nausea and vomiting)    Pre-diabetes    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/05/2012   Pulmonary embolism (HCC) 2011   Stroke [facility])    'When I  was young. no residual   Thoracic aortic aneurysm 03/31/2018   TOBACCO ABUSE 04/04/2009   Qualifier: Diagnosis of  By: Parthenia RIGGERS Olivia Mason  Qualifier: Diagnosis of  By: Morris, MD, CODY Debby Lenis    Transfusion history    ?'12 or '13   UNSPECIFIED ANEMIA 07/03/2010   Qualifier: Diagnosis of  By: Arlyss Math     Unspecified mastoiditis     Past Surgical History:  Procedure Laterality Date   ANGIOPLASTY  07/02/09, 04/01/10   BILATERAL SALPINGOOPHORECTOMY     CAD( bare metal stent)  02/2009   x 1   CAROTID-SUBCLAVIAN BYPASS GRAFT Left 03/31/2018   Procedure: LEFT SUBCLAVIAN ARTERY BYPASS GRAFT;  Surgeon: Serene Gaile ORN, MD;  Location: [facility] OR;  Service: Vascular;  Laterality: Left;   CERVICAL SPINE SURGERY  08/08   COLONOSCOPY WITH PROPOFOL  N/A 04/29/2016   Procedure: COLONOSCOPY WITH PROPOFOL ;  Surgeon: Elspeth Deward Naval, MD;  Location: [facility];  Service: Gastroenterology;  Laterality: N/A;   ESOPHAGOGASTRODUODENOSCOPY N/A 12/09/2016   Procedure: ESOPHAGOGASTRODUODENOSCOPY (EGD);  Surgeon: Naval Elspeth Deward, MD;  Location: [facility];  Service: Gastroenterology;  Laterality: N/A;   ESOPHAGOGASTRODUODENOSCOPY (EGD) WITH PROPOFOL  N/A 04/29/2016   Procedure: ESOPHAGOGASTRODUODENOSCOPY (EGD) WITH PROPOFOL ;  Surgeon: Elspeth Deward Naval, MD;  Location: [facility];  Service: Gastroenterology;  Laterality: N/A;   EUS N/A 05/15/2016   Procedure: UPPER ENDOSCOPIC ULTRASOUND (EUS) RADIAL;  Surgeon: Toribio SHAUNNA Cedar, MD;  Location: [facility];  Service: Endoscopy;  Laterality: N/A;   ICD GENERATOR CHANGEOUT N/A 11/04/2022   Procedure: ICD GENERATOR CHANGEOUT;  Surgeon: Nancey, Eulas BRAVO, MD;  Location: [facility] INVASIVE CV LAB;  Service: Cardiovascular;  Laterality: N/A;   IMPLANTABLE CARDIOVERTER DEFIBRILLATOR IMPLANT N/A 08/05/2011   Primary prevention SJM ICD implanted,  Analyze ST study patient   INNER EAR SURGERY     left x 17   LUMBAR DISC SURGERY  02/2008   fusion    NASAL SEPTUM SURGERY     RIGHT HEART CATH N/A 12/11/2021   Procedure: RIGHT HEART CATH;  Surgeon: Wonda Sharper, MD;  [facility] INVASIVE CV LAB;  Service: Cardiovascular;  Laterality: N/A;   THORACIC AORTIC ENDOVASCULAR STENT GRAFT N/A 03/31/2018   Procedure: THORACIC AORTIC ENDOVASCULAR  STENT GRAFT;  Surgeon: Serene Gaile ORN, MD;  Location: [facility] OR;  Service: Vascular;  Laterality: N/A;   TOTAL ABDOMINAL HYSTERECTOMY     complete    Social History   Socioeconomic History   Marital status: Divorced    Spouse name: Not on file   Number of children: 2   Years of education: 11   Highest education level: 12th grade  Occupational History   Occupation: Designer, Industrial/product: UNEMPLOYED    Employer: DISABLED  Tobacco Use   Smoking status: Former    Current packs/day: 0.13    Average packs/day: 0.1 packs/day for 30.0 years (3.9 ttl pk-yrs)    Types: Cigarettes   Smokeless tobacco: Never  Vaping Use   Vaping status: Never Used  Substance and Sexual Activity   Alcohol use: No    Alcohol/week: 0.0 standard drinks of alcohol   Drug use: No   Sexual activity: Not Currently  Other Topics Concern   Not on file  Social History Narrative   Pt lives in [place] alone. 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).Patient has 11 th grade education.Right handed.Caffeine - one cup daily   Social Drivers of Health   Financial Resource Strain: Low Risk  (02/23/2024)   Overall Financial Resource Strain (CARDIA)    Difficulty of Paying Living Expenses: Not hard at all  Food Insecurity: No Food Insecurity (05/12/2024)   Hunger Vital Sign    Worried About Running Out of Food in the Last Year: Never true    Ran Out of Food in the Last Year: Never true  Transportation Needs: No Transportation Needs (05/12/2024)   PRAPARE - Administrator, Civil Service (Medical): No    Lack of Transportation (Non-Medical): No  Physical Activity: Inactive (02/23/2024)   Exercise Vital  Sign    Days of Exercise per Week: 0 days    Minutes of Exercise per Session: 0 min  Stress: No Stress Concern Present (02/23/2024)   [employer] of Occupational Health - Occupational Stress Questionnaire    Feeling of Stress: Not at all  Social Connections: Moderately Integrated (05/12/2024)   Social Connection and Isolation Panel    Frequency of Communication with Friends and Family: More than three times a week    Frequency of Social Gatherings with Friends and Family: More than three times a week    Attends Religious Services: More than 4 times per year    Active Member of [employer] or Organizations: Yes    Attends Banker Meetings: More than 4 times per year    Marital Status: Widowed  Intimate Partner Violence: Not At Risk (05/12/2024)   Humiliation, Afraid, Rape, and Kick questionnaire    Fear of Current or Ex-Partner: No    Emotionally Abused: No    Physically Abused: No    Sexually Abused: No    Family History  Problem Relation Age of Onset   Colon cancer Mother    Other Brother        tumor in his brain   Bladder Cancer Brother    Throat cancer Brother    Cancer Son        kidney, spread to his liver   Breast cancer Cousin      Review of Systems  Constitutional: Negative.  Negative for chills and fever.  HENT:  Positive for congestion. Negative for sore throat.   Respiratory:  Positive for cough.   Cardiovascular:  Negative for chest pain and  palpitations.  Gastrointestinal:  Negative for abdominal pain, diarrhea, nausea and vomiting.  Genitourinary: Negative.  Negative for dysuria and hematuria.  Skin: Negative.  Negative for rash.  Neurological: Negative.  Negative for dizziness and headaches.  All other systems reviewed and are negative.   Today's Vitals   06/30/24 1540  BP: (!) 140/80  Pulse: 100  Temp: 98.4 F (36.9 C)  TempSrc: Oral  SpO2: 99%  Weight: 163 lb (73.9 kg)  Height: 5' 2 (1.575 m)   Body mass index is 29.81  kg/m.   Physical Exam Vitals reviewed.  Constitutional:      Appearance: Normal appearance.  HENT:     Head: Normocephalic.     Right Ear: Tympanic membrane, ear canal and external ear normal.     Left Ear: Tympanic membrane normal.     Nose: Congestion present.     Mouth/Throat:     Mouth: Mucous membranes are moist.     Pharynx: Oropharynx is clear.  Eyes:     Extraocular Movements: Extraocular movements intact.     Conjunctiva/sclera: Conjunctivae normal.     Pupils: Pupils are equal, round, and reactive to light.  Cardiovascular:     Rate and Rhythm: Normal rate and regular rhythm.     Pulses: Normal pulses.     Heart sounds: Normal heart sounds.  Pulmonary:     Effort: Pulmonary effort is normal.     Breath sounds: Normal breath sounds.  Musculoskeletal:     Cervical back: No tenderness.  Lymphadenopathy:     Cervical: No cervical adenopathy.  Skin:    General: Skin is warm and dry.     Capillary Refill: Capillary refill takes less than 2 seconds.  Neurological:     General: No focal deficit present.     Mental Status: She is alert and oriented to person, place, and time.  Psychiatric:        Mood and Affect: Mood normal.        Behavior: Behavior normal.      ASSESSMENT & PLAN: Problem List Items Addressed This Visit       Respiratory   Acute bacterial sinusitis - Primary   Upper viral infection now with secondary bacterial sinus infection Recommend Augmentin  875 mg twice a day for at least 7 days Symptom management discussed Advised to rest and stay well-hydrated Advised to contact the office if no better or worse during the next several days      Relevant Medications   amoxicillin -clavulanate (AUGMENTIN ) 875-125 MG tablet   fluticasone (FLONASE) 50 MCG/ACT nasal spray   guaiFENesin  (MUCINEX ) 600 MG 12 hr tablet   Sinus congestion   Symptom management discussed Recommend Flonase nasal spray and saline nasal spray frequently during the  day Recommend Mucinex  twice a day for least 5 days      Relevant Medications   fluticasone (FLONASE) 50 MCG/ACT nasal spray   guaiFENesin  (MUCINEX ) 600 MG 12 hr tablet     Endocrine   Diabetes mellitus due to underlying condition with stage 4 chronic kidney disease, without long-term current use of insulin (HCC)   Lab Results  Component Value Date   HGBA1C 6.6 (H) 08/31/2023  Chronic stable condition No concerns.  Diet controlled         Other   Presence of heart assist device (HCC)   Stable.  Monitored closely by cardiologist.      Patient Instructions  Sinus Infection, Adult A sinus infection is soreness and swelling (inflammation) of  your sinuses. Sinuses are hollow spaces in the bones around your face. They are located: Around your eyes. In the middle of your forehead. Behind your nose. In your cheekbones. Your sinuses and nasal passages are lined with a fluid called mucus. Mucus drains out of your sinuses. Swelling can trap mucus in your sinuses. This lets germs (bacteria, virus, or fungus) grow, which leads to infection. Most of the time, this condition is caused by a virus. What are the causes? Allergies. Asthma. Germs. Things that block your nose or sinuses. Growths in the nose (nasal polyps). Chemicals or irritants in the air. A fungus. This is rare. What increases the risk? Having a weak body defense system (immune system). Doing a lot of swimming or diving. Using nasal sprays too much. Smoking. What are the signs or symptoms? The main symptoms of this condition are pain and a feeling of pressure around the sinuses. Other symptoms include: Stuffy nose (congestion). This may make it hard to breathe through your nose. Runny nose (drainage). Soreness, swelling, and warmth in the sinuses. A cough that may get worse at night. Being unable to smell and taste. Mucus that collects in the throat or the back of the nose (postnasal drip). This may cause a sore  throat or bad breath. Being very tired (fatigued). A fever. How is this diagnosed? Your symptoms. Your medical history. A physical exam. Tests to find out if your condition is short-term (acute) or long-term (chronic). Your doctor may: Check your nose for growths (polyps). Check your sinuses using a tool that has a light on one end (endoscope). Check for allergies or germs. Do imaging tests, such as an MRI or CT scan. How is this treated? Treatment for this condition depends on the cause and whether it is short-term or long-term. If caused by a virus, your symptoms should go away on their own within 10 days. You may be given medicines to relieve symptoms. They include: Medicines that shrink swollen tissue in the nose. A spray that treats swelling of the nostrils. Rinses that help get rid of thick mucus in your nose (nasal saline washes). Medicines that treat allergies (antihistamines). Over-the-counter pain relievers. If caused by bacteria, your doctor may wait to see if you will get better without treatment. You may be given antibiotic medicine if you have: A very bad infection. A weak body defense system. If caused by growths in the nose, surgery may be needed. Follow these instructions at home: Medicines Take, use, or apply over-the-counter and prescription medicines only as told by your doctor. These may include nasal sprays. If you were prescribed an antibiotic medicine, take it as told by your doctor. Do not stop taking it even if you start to feel better. Hydrate and humidify  Drink enough water to keep your pee (urine) pale yellow. Use a cool mist humidifier to keep the humidity level in your home above 50%. Breathe in steam for 10-15 minutes, 3-4 times a day, or as told by your doctor. You can do this in the bathroom while a hot shower is running. Try not to spend time in cool or dry air. Rest Rest as much as you can. Sleep with your head raised (elevated). Make sure you  get enough sleep each night. General instructions  Put a warm, moist washcloth on your face 3-4 times a day, or as often as told by your doctor. Use nasal saline washes as often as told by your doctor. Wash your hands often with soap and  water. If you cannot use soap and water, use hand sanitizer. Do not smoke. Avoid being around people who are smoking (secondhand smoke). Keep all follow-up visits. Contact a doctor if: You have a fever. Your symptoms get worse. Your symptoms do not get better within 10 days. Get help right away if: You have a very bad headache. You cannot stop vomiting. You have very bad pain or swelling around your face or eyes. You have trouble seeing. You feel confused. Your neck is stiff. You have trouble breathing. These symptoms may be an emergency. Get help right away. Call 911. Do not wait to see if the symptoms will go away. Do not drive yourself to the hospital. Summary A sinus infection is swelling of your sinuses. Sinuses are hollow spaces in the bones around your face. This condition is caused by tissues in your nose that become inflamed or swollen. This traps germs. These can lead to infection. If you were prescribed an antibiotic medicine, take it as told by your doctor. Do not stop taking it even if you start to feel better. Keep all follow-up visits. This information is not intended to replace advice given to you by your health care provider. Make sure you discuss any questions you have with your health care provider. Document Revised: 06/18/2021 Document Reviewed: 06/18/2021 Elsevier Patient Education  2024 Elsevier Inc.    Emil Schaumann, MD [facility]

## 2024-06-30 NOTE — Patient Instructions (Signed)

## 2024-06-30 NOTE — Assessment & Plan Note (Signed)
 Upper viral infection now with secondary bacterial sinus infection Recommend Augmentin  875 mg twice a day for at least 7 days Symptom management discussed Advised to rest and stay well-hydrated Advised to contact the office if no better or worse during the next several days

## 2024-06-30 NOTE — Assessment & Plan Note (Signed)
 Stable.  Monitored closely by cardiologist.

## 2024-06-30 NOTE — Assessment & Plan Note (Signed)
 Symptom management discussed Recommend Flonase  nasal spray and saline nasal spray frequently during the day Recommend Mucinex  twice a day for least 5 days

## 2024-08-01 ENCOUNTER — Ambulatory Visit: Admitting: Family Medicine

## 2024-08-01 ENCOUNTER — Ambulatory Visit: Payer: Self-pay

## 2024-08-01 ENCOUNTER — Inpatient Hospital Stay (HOSPITAL_COMMUNITY)
Admission: EM | Admit: 2024-08-01 | Discharge: 2024-08-05 | DRG: 193 | Disposition: A | Discharge status: Home Health | Attending: Internal Medicine | Admitting: Internal Medicine

## 2024-08-01 ENCOUNTER — Encounter (HOSPITAL_COMMUNITY): Payer: Self-pay | Admitting: *Deleted

## 2024-08-01 ENCOUNTER — Observation Stay (HOSPITAL_COMMUNITY)

## 2024-08-01 ENCOUNTER — Emergency Department (HOSPITAL_COMMUNITY)

## 2024-08-01 ENCOUNTER — Encounter: Payer: Self-pay | Admitting: Family Medicine

## 2024-08-01 ENCOUNTER — Other Ambulatory Visit: Payer: Self-pay

## 2024-08-01 ENCOUNTER — Telehealth: Payer: Self-pay

## 2024-08-01 VITALS — BP 160/110 | HR 110 | Temp 98.4°F | Ht 62.0 in | Wt 163.0 lb

## 2024-08-01 DIAGNOSIS — J441 Chronic obstructive pulmonary disease with (acute) exacerbation: Secondary | ICD-10-CM | POA: Diagnosis not present

## 2024-08-01 DIAGNOSIS — N184 Chronic kidney disease, stage 4 (severe): Secondary | ICD-10-CM | POA: Diagnosis present

## 2024-08-01 DIAGNOSIS — I10 Essential (primary) hypertension: Secondary | ICD-10-CM

## 2024-08-01 DIAGNOSIS — E876 Hypokalemia: Secondary | ICD-10-CM | POA: Diagnosis present

## 2024-08-01 DIAGNOSIS — G43009 Migraine without aura, not intractable, without status migrainosus: Secondary | ICD-10-CM

## 2024-08-01 DIAGNOSIS — E785 Hyperlipidemia, unspecified: Secondary | ICD-10-CM | POA: Diagnosis present

## 2024-08-01 DIAGNOSIS — N2889 Other specified disorders of kidney and ureter: Secondary | ICD-10-CM

## 2024-08-01 DIAGNOSIS — R000 Tachycardia, unspecified: Secondary | ICD-10-CM | POA: Diagnosis not present

## 2024-08-01 DIAGNOSIS — Z803 Family history of malignant neoplasm of breast: Secondary | ICD-10-CM

## 2024-08-01 DIAGNOSIS — Z860100 Personal history of colon polyps, unspecified: Secondary | ICD-10-CM

## 2024-08-01 DIAGNOSIS — Z86711 Personal history of pulmonary embolism: Secondary | ICD-10-CM | POA: Diagnosis not present

## 2024-08-01 DIAGNOSIS — R0981 Nasal congestion: Secondary | ICD-10-CM

## 2024-08-01 DIAGNOSIS — N1832 Chronic kidney disease, stage 3b: Secondary | ICD-10-CM | POA: Diagnosis not present

## 2024-08-01 DIAGNOSIS — Z808 Family history of malignant neoplasm of other organs or systems: Secondary | ICD-10-CM

## 2024-08-01 DIAGNOSIS — Z7982 Long term (current) use of aspirin: Secondary | ICD-10-CM

## 2024-08-01 DIAGNOSIS — N179 Acute kidney failure, unspecified: Secondary | ICD-10-CM | POA: Diagnosis present

## 2024-08-01 DIAGNOSIS — Z95811 Presence of heart assist device: Secondary | ICD-10-CM | POA: Diagnosis not present

## 2024-08-01 DIAGNOSIS — J449 Chronic obstructive pulmonary disease, unspecified: Secondary | ICD-10-CM | POA: Diagnosis present

## 2024-08-01 DIAGNOSIS — N289 Disorder of kidney and ureter, unspecified: Secondary | ICD-10-CM

## 2024-08-01 DIAGNOSIS — H9192 Unspecified hearing loss, left ear: Secondary | ICD-10-CM | POA: Diagnosis present

## 2024-08-01 DIAGNOSIS — I2489 Other forms of acute ischemic heart disease: Secondary | ICD-10-CM | POA: Diagnosis present

## 2024-08-01 DIAGNOSIS — R0602 Shortness of breath: Secondary | ICD-10-CM | POA: Diagnosis not present

## 2024-08-01 DIAGNOSIS — B9789 Other viral agents as the cause of diseases classified elsewhere: Secondary | ICD-10-CM | POA: Diagnosis present

## 2024-08-01 DIAGNOSIS — R0603 Acute respiratory distress: Secondary | ICD-10-CM

## 2024-08-01 DIAGNOSIS — F17200 Nicotine dependence, unspecified, uncomplicated: Secondary | ICD-10-CM | POA: Diagnosis present

## 2024-08-01 DIAGNOSIS — I5022 Chronic systolic (congestive) heart failure: Secondary | ICD-10-CM | POA: Diagnosis not present

## 2024-08-01 DIAGNOSIS — E782 Mixed hyperlipidemia: Secondary | ICD-10-CM | POA: Diagnosis not present

## 2024-08-01 DIAGNOSIS — Z634 Disappearance and death of family member: Secondary | ICD-10-CM

## 2024-08-01 DIAGNOSIS — E0822 Diabetes mellitus due to underlying condition with diabetic chronic kidney disease: Secondary | ICD-10-CM | POA: Diagnosis present

## 2024-08-01 DIAGNOSIS — D631 Anemia in chronic kidney disease: Secondary | ICD-10-CM | POA: Diagnosis present

## 2024-08-01 DIAGNOSIS — R052 Subacute cough: Secondary | ICD-10-CM | POA: Diagnosis not present

## 2024-08-01 DIAGNOSIS — I2510 Atherosclerotic heart disease of native coronary artery without angina pectoris: Secondary | ICD-10-CM | POA: Diagnosis present

## 2024-08-01 DIAGNOSIS — I255 Ischemic cardiomyopathy: Secondary | ICD-10-CM | POA: Diagnosis present

## 2024-08-01 DIAGNOSIS — Z888 Allergy status to other drugs, medicaments and biological substances status: Secondary | ICD-10-CM

## 2024-08-01 DIAGNOSIS — G894 Chronic pain syndrome: Secondary | ICD-10-CM | POA: Diagnosis present

## 2024-08-01 DIAGNOSIS — Z800 Family history of malignant neoplasm of digestive organs: Secondary | ICD-10-CM

## 2024-08-01 DIAGNOSIS — I130 Hypertensive heart and chronic kidney disease with heart failure and stage 1 through stage 4 chronic kidney disease, or unspecified chronic kidney disease: Secondary | ICD-10-CM | POA: Diagnosis not present

## 2024-08-01 DIAGNOSIS — I252 Old myocardial infarction: Secondary | ICD-10-CM

## 2024-08-01 DIAGNOSIS — K219 Gastro-esophageal reflux disease without esophagitis: Secondary | ICD-10-CM | POA: Diagnosis present

## 2024-08-01 DIAGNOSIS — K5900 Constipation, unspecified: Secondary | ICD-10-CM | POA: Diagnosis present

## 2024-08-01 DIAGNOSIS — Z8673 Personal history of transient ischemic attack (TIA), and cerebral infarction without residual deficits: Secondary | ICD-10-CM

## 2024-08-01 DIAGNOSIS — Z955 Presence of coronary angioplasty implant and graft: Secondary | ICD-10-CM

## 2024-08-01 DIAGNOSIS — R072 Precordial pain: Secondary | ICD-10-CM

## 2024-08-01 DIAGNOSIS — Z66 Do not resuscitate: Secondary | ICD-10-CM | POA: Diagnosis present

## 2024-08-01 DIAGNOSIS — I5020 Unspecified systolic (congestive) heart failure: Secondary | ICD-10-CM | POA: Diagnosis present

## 2024-08-01 DIAGNOSIS — B348 Other viral infections of unspecified site: Secondary | ICD-10-CM

## 2024-08-01 DIAGNOSIS — Z87891 Personal history of nicotine dependence: Secondary | ICD-10-CM

## 2024-08-01 DIAGNOSIS — R6510 Systemic inflammatory response syndrome (SIRS) of non-infectious origin without acute organ dysfunction: Secondary | ICD-10-CM

## 2024-08-01 DIAGNOSIS — D649 Anemia, unspecified: Secondary | ICD-10-CM | POA: Diagnosis present

## 2024-08-01 DIAGNOSIS — Z8052 Family history of malignant neoplasm of bladder: Secondary | ICD-10-CM

## 2024-08-01 DIAGNOSIS — J189 Pneumonia, unspecified organism: Principal | ICD-10-CM | POA: Diagnosis present

## 2024-08-01 DIAGNOSIS — E872 Acidosis, unspecified: Secondary | ICD-10-CM | POA: Diagnosis present

## 2024-08-01 DIAGNOSIS — Z882 Allergy status to sulfonamides status: Secondary | ICD-10-CM

## 2024-08-01 DIAGNOSIS — Z90710 Acquired absence of both cervix and uterus: Secondary | ICD-10-CM

## 2024-08-01 DIAGNOSIS — J440 Chronic obstructive pulmonary disease with acute lower respiratory infection: Secondary | ICD-10-CM | POA: Diagnosis present

## 2024-08-01 DIAGNOSIS — J9601 Acute respiratory failure with hypoxia: Principal | ICD-10-CM | POA: Diagnosis present

## 2024-08-01 DIAGNOSIS — Z95810 Presence of automatic (implantable) cardiac defibrillator: Secondary | ICD-10-CM | POA: Diagnosis present

## 2024-08-01 DIAGNOSIS — K589 Irritable bowel syndrome without diarrhea: Secondary | ICD-10-CM | POA: Diagnosis present

## 2024-08-01 DIAGNOSIS — Z79899 Other long term (current) drug therapy: Secondary | ICD-10-CM

## 2024-08-01 LAB — CBC WITH DIFFERENTIAL/PLATELET
Abs Immature Granulocytes: 0.08 K/uL — ABNORMAL HIGH (ref 0.00–0.07)
Basophils Absolute: 0.1 K/uL (ref 0.0–0.1)
Basophils Relative: 0 %
Eosinophils Absolute: 0.3 K/uL (ref 0.0–0.5)
Eosinophils Relative: 1 %
HCT: 36.3 % (ref 36.0–46.0)
Hemoglobin: 12 g/dL (ref 12.0–15.0)
Immature Granulocytes: 1 %
Lymphocytes Relative: 50 %
Lymphs Abs: 8.6 K/uL — ABNORMAL HIGH (ref 0.7–4.0)
MCH: 34.1 pg — ABNORMAL HIGH (ref 26.0–34.0)
MCHC: 33.1 g/dL (ref 30.0–36.0)
MCV: 103.1 fL — ABNORMAL HIGH (ref 80.0–100.0)
Monocytes Absolute: 0.8 K/uL (ref 0.1–1.0)
Monocytes Relative: 5 %
Neutro Abs: 7.5 K/uL (ref 1.7–7.7)
Neutrophils Relative %: 43 %
Platelets: 277 K/uL (ref 150–400)
RBC: 3.52 MIL/uL — ABNORMAL LOW (ref 3.87–5.11)
RDW: 14.5 % (ref 11.5–15.5)
Smear Review: NORMAL
WBC: 17.4 K/uL — ABNORMAL HIGH (ref 4.0–10.5)
nRBC: 0.1 % (ref 0.0–0.2)

## 2024-08-01 LAB — LIPASE, BLOOD: Lipase: 53 U/L — ABNORMAL HIGH (ref 11–51)

## 2024-08-01 LAB — BLOOD GAS, ARTERIAL
Acid-base deficit: 7.4 mmol/L — ABNORMAL HIGH (ref 0.0–2.0)
Bicarbonate: 17.3 mmol/L — ABNORMAL LOW (ref 20.0–28.0)
Drawn by: 213381
O2 Saturation: 98.6 %
Patient temperature: 37.1
pCO2 arterial: 32 mmHg (ref 32–48)
pH, Arterial: 7.34 — ABNORMAL LOW (ref 7.35–7.45)
pO2, Arterial: 91 mmHg (ref 83–108)

## 2024-08-01 LAB — I-STAT CG4 LACTIC ACID, ED
Lactic Acid, Venous: 2 mmol/L (ref 0.5–1.9)
Lactic Acid, Venous: 2.7 mmol/L (ref 0.5–1.9)

## 2024-08-01 LAB — RESPIRATORY PANEL BY PCR

## 2024-08-01 LAB — POCT INFLUENZA A/B
Influenza A, POC: NEGATIVE
Influenza B, POC: NEGATIVE

## 2024-08-01 LAB — POC COVID19 BINAXNOW: SARS Coronavirus 2 Ag: NEGATIVE

## 2024-08-01 LAB — COMPREHENSIVE METABOLIC PANEL WITH GFR
ALT: 23 U/L (ref 0–44)
AST: 29 U/L (ref 15–41)
Albumin: 4.1 g/dL (ref 3.5–5.0)
Alkaline Phosphatase: 151 U/L — ABNORMAL HIGH (ref 38–126)
Anion gap: 15 (ref 5–15)
BUN: 26 mg/dL — ABNORMAL HIGH (ref 8–23)
CO2: 21 mmol/L — ABNORMAL LOW (ref 22–32)
Calcium: 9.6 mg/dL (ref 8.9–10.3)
Chloride: 105 mmol/L (ref 98–111)
Creatinine, Ser: 1.88 mg/dL — ABNORMAL HIGH (ref 0.44–1.00)
GFR, Estimated: 27 mL/min — ABNORMAL LOW
Glucose, Bld: 158 mg/dL — ABNORMAL HIGH (ref 70–99)
Potassium: 3 mmol/L — ABNORMAL LOW (ref 3.5–5.1)
Sodium: 141 mmol/L (ref 135–145)
Total Bilirubin: 0.4 mg/dL (ref 0.0–1.2)
Total Protein: 7.9 g/dL (ref 6.5–8.1)

## 2024-08-01 LAB — PRO BRAIN NATRIURETIC PEPTIDE: Pro Brain Natriuretic Peptide: 1403 pg/mL — ABNORMAL HIGH

## 2024-08-01 LAB — TROPONIN T, HIGH SENSITIVITY: Troponin T High Sensitivity: 26 ng/L — ABNORMAL HIGH (ref 0–19)

## 2024-08-01 LAB — PROCALCITONIN: Procalcitonin: 0.12 ng/mL

## 2024-08-01 MED ORDER — HYDRALAZINE HCL 10 MG PO TABS
10.0000 mg | ORAL_TABLET | Freq: Three times a day (TID) | ORAL | Status: DC
  Administered 2024-08-01 – 2024-08-05 (×10): 10 mg via ORAL
  Filled 2024-08-01 (×11): qty 1

## 2024-08-01 MED ORDER — IPRATROPIUM BROMIDE 0.02 % IN SOLN
0.5000 mg | Freq: Four times a day (QID) | RESPIRATORY_TRACT | Status: DC
  Administered 2024-08-01 – 2024-08-02 (×6): 0.5 mg via RESPIRATORY_TRACT
  Filled 2024-08-01 (×6): qty 2.5

## 2024-08-01 MED ORDER — ASPIRIN 81 MG PO TBEC
81.0000 mg | DELAYED_RELEASE_TABLET | Freq: Every day | ORAL | Status: DC
  Administered 2024-08-01: 81 mg via ORAL

## 2024-08-01 MED ORDER — ONDANSETRON HCL 4 MG PO TABS: 4.0000 mg | ORAL_TABLET | Freq: Four times a day (QID) | ORAL | Status: DC | PRN

## 2024-08-01 MED ORDER — TRAMADOL HCL 50 MG PO TABS
50.0000 mg | ORAL_TABLET | Freq: Two times a day (BID) | ORAL | Status: DC | PRN
  Administered 2024-08-03 (×2): 50 mg via ORAL
  Filled 2024-08-01 (×3): qty 1

## 2024-08-01 MED ORDER — FUROSEMIDE 40 MG PO TABS: 40.0000 mg | ORAL_TABLET | Freq: Every day | ORAL | Status: DC

## 2024-08-01 MED ORDER — ISOSORBIDE MONONITRATE ER 30 MG PO TB24
30.0000 mg | ORAL_TABLET | Freq: Every day | ORAL | Status: DC
  Administered 2024-08-01 – 2024-08-05 (×5): 30 mg via ORAL
  Filled 2024-08-01 (×6): qty 1

## 2024-08-01 MED ORDER — ROSUVASTATIN CALCIUM 20 MG PO TABS
20.0000 mg | ORAL_TABLET | Freq: Every day | ORAL | Status: DC
  Administered 2024-08-01 – 2024-08-04 (×4): 20 mg via ORAL
  Filled 2024-08-01 (×4): qty 1

## 2024-08-01 MED ORDER — HYDRALAZINE HCL 20 MG/ML IJ SOLN: 5.0000 mg | Freq: Four times a day (QID) | INTRAMUSCULAR | Status: DC | PRN

## 2024-08-01 MED ORDER — TOPIRAMATE 25 MG PO TABS
50.0000 mg | ORAL_TABLET | Freq: Every day | ORAL | Status: DC
  Administered 2024-08-01 – 2024-08-05 (×5): 50 mg via ORAL
  Filled 2024-08-01 (×5): qty 2

## 2024-08-01 MED ORDER — ONDANSETRON HCL 4 MG/2ML IJ SOLN: 4.0000 mg | Freq: Four times a day (QID) | INTRAMUSCULAR | Status: DC | PRN

## 2024-08-01 MED ORDER — ORAL CARE MOUTH RINSE: 15.0000 mL | OROMUCOSAL | Status: DC | PRN

## 2024-08-01 MED ORDER — PANTOPRAZOLE SODIUM 40 MG PO TBEC
40.0000 mg | DELAYED_RELEASE_TABLET | Freq: Every day | ORAL | Status: DC
  Administered 2024-08-02 – 2024-08-05 (×4): 40 mg via ORAL
  Filled 2024-08-01 (×4): qty 1

## 2024-08-01 MED ORDER — POLYETHYLENE GLYCOL 3350 17 GM/SCOOP PO POWD: 17.0000 g | Freq: Every day | ORAL | Status: DC | PRN

## 2024-08-01 MED ORDER — SODIUM CHLORIDE 0.9 % IV SOLN
2.0000 g | INTRAVENOUS | Status: AC
  Administered 2024-08-01 – 2024-08-05 (×5): 2 g via INTRAVENOUS
  Filled 2024-08-01 (×5): qty 20

## 2024-08-01 MED ORDER — MAGNESIUM SULFATE 2 GM/50ML IV SOLN
2.0000 g | Freq: Once | INTRAVENOUS | Status: AC
  Administered 2024-08-01: 2 g via INTRAVENOUS
  Filled 2024-08-01: qty 50

## 2024-08-01 MED ORDER — FLUTICASONE PROPIONATE 50 MCG/ACT NA SUSP
2.0000 | Freq: Every day | NASAL | Status: DC
  Administered 2024-08-02 – 2024-08-05 (×4): 2 via NASAL
  Filled 2024-08-01 (×2): qty 16

## 2024-08-01 MED ORDER — KETOROLAC TROMETHAMINE 15 MG/ML IJ SOLN
15.0000 mg | Freq: Four times a day (QID) | INTRAMUSCULAR | Status: DC | PRN
  Administered 2024-08-03: 15 mg via INTRAVENOUS
  Filled 2024-08-01: qty 1

## 2024-08-01 MED ORDER — SODIUM CHLORIDE 0.9 % IV SOLN
500.0000 mg | INTRAVENOUS | Status: DC
  Administered 2024-08-01: 500 mg via INTRAVENOUS
  Filled 2024-08-01 (×2): qty 5

## 2024-08-01 MED ORDER — NITROGLYCERIN 0.4 MG SL SUBL: 0.4000 mg | SUBLINGUAL_TABLET | SUBLINGUAL | Status: DC | PRN

## 2024-08-01 MED ORDER — LEVALBUTEROL HCL 0.63 MG/3ML IN NEBU
0.6300 mg | INHALATION_SOLUTION | Freq: Four times a day (QID) | RESPIRATORY_TRACT | Status: DC
  Administered 2024-08-01 – 2024-08-02 (×6): 0.63 mg via RESPIRATORY_TRACT
  Filled 2024-08-01 (×6): qty 3

## 2024-08-01 MED ORDER — SODIUM CHLORIDE 0.9% FLUSH
3.0000 mL | Freq: Two times a day (BID) | INTRAVENOUS | Status: DC
  Administered 2024-08-01 – 2024-08-05 (×9): 3 mL via INTRAVENOUS

## 2024-08-01 MED ORDER — SORBITOL 70 % SOLN: 30.0000 mL | Freq: Every day | Status: DC | PRN

## 2024-08-01 MED ORDER — ASPIRIN 81 MG PO TBEC
81.0000 mg | DELAYED_RELEASE_TABLET | Freq: Every day | ORAL | Status: DC
  Administered 2024-08-02 – 2024-08-05 (×4): 81 mg via ORAL
  Filled 2024-08-01 (×4): qty 1

## 2024-08-01 MED ORDER — METOPROLOL TARTRATE 50 MG PO TABS
50.0000 mg | ORAL_TABLET | Freq: Two times a day (BID) | ORAL | Status: DC
  Administered 2024-08-01 – 2024-08-05 (×8): 50 mg via ORAL
  Filled 2024-08-01 (×8): qty 1

## 2024-08-01 MED ORDER — ACETAMINOPHEN 325 MG PO TABS
650.0000 mg | ORAL_TABLET | Freq: Four times a day (QID) | ORAL | Status: DC | PRN
  Administered 2024-08-01 – 2024-08-03 (×2): 650 mg via ORAL
  Filled 2024-08-01 (×2): qty 2

## 2024-08-01 MED ORDER — LORATADINE 10 MG PO TABS
10.0000 mg | ORAL_TABLET | Freq: Every day | ORAL | Status: DC
  Administered 2024-08-01 – 2024-08-05 (×5): 10 mg via ORAL
  Filled 2024-08-01 (×5): qty 1

## 2024-08-01 MED ORDER — POTASSIUM CHLORIDE CRYS ER 20 MEQ PO TBCR
40.0000 meq | EXTENDED_RELEASE_TABLET | ORAL | Status: AC
  Administered 2024-08-01 (×2): 40 meq via ORAL
  Filled 2024-08-01 (×2): qty 2

## 2024-08-01 MED ORDER — BUDESONIDE 0.5 MG/2ML IN SUSP
0.5000 mg | Freq: Two times a day (BID) | RESPIRATORY_TRACT | Status: DC
  Administered 2024-08-01 – 2024-08-05 (×8): 0.5 mg via RESPIRATORY_TRACT
  Filled 2024-08-01 (×8): qty 2

## 2024-08-01 MED ORDER — ZOLPIDEM TARTRATE 5 MG PO TABS
5.0000 mg | ORAL_TABLET | Freq: Every evening | ORAL | Status: DC | PRN
  Administered 2024-08-01 – 2024-08-04 (×4): 5 mg via ORAL
  Filled 2024-08-01 (×4): qty 1

## 2024-08-01 MED ORDER — ACETAMINOPHEN 650 MG RE SUPP: 650.0000 mg | Freq: Four times a day (QID) | RECTAL | Status: DC | PRN

## 2024-08-01 MED ORDER — AMITRIPTYLINE HCL 25 MG PO TABS
50.0000 mg | ORAL_TABLET | Freq: Every day | ORAL | Status: DC
  Administered 2024-08-01 – 2024-08-04 (×4): 50 mg via ORAL
  Filled 2024-08-01 (×4): qty 2

## 2024-08-01 MED ORDER — HEPARIN SODIUM (PORCINE) 5000 UNIT/ML IJ SOLN
5000.0000 [IU] | Freq: Three times a day (TID) | INTRAMUSCULAR | Status: DC
  Administered 2024-08-01 – 2024-08-05 (×12): 5000 [IU] via SUBCUTANEOUS
  Filled 2024-08-01 (×12): qty 1

## 2024-08-01 MED ORDER — ARFORMOTEROL TARTRATE 15 MCG/2ML IN NEBU
15.0000 ug | INHALATION_SOLUTION | Freq: Two times a day (BID) | RESPIRATORY_TRACT | Status: DC
  Administered 2024-08-01 – 2024-08-05 (×8): 15 ug via RESPIRATORY_TRACT
  Filled 2024-08-01 (×8): qty 2

## 2024-08-01 NOTE — Progress Notes (Signed)
 "  Acute Office Visit  Subjective:     Patient ID: [name], female    DOB: April 08, 1946, 79 y.o.   MRN: [id number]  Chief Complaint  Patient presents with   Cough    Cough    Discussed the use of AI scribe software for clinical note transcription with the patient, who gave verbal consent to proceed.  History of Present Illness [name] is a 79 year old female with stage four CKD and congestive heart failure who presents with persistent shortness of breath and chest pain.  Dyspnea and cough - Persistent shortness of breath, worsening over time - Productive cough - Symptoms briefly improved after treatment for bacterial sinusitis with antibiotics and Flonase  on 06/30/2024, then progressed again - No new swelling, fever, chills, or sick contacts - COVID and flu tests negative  Chest pain - Retrosternal chest pain, nonburning in character - Initially thought to be reflux, but clarifies it is not burning  Fatigue - Ongoing fatigue  Gastrointestinal symptoms - No abdominal pain, nausea, or vomiting  Medication adherence - Unsure if morning medications, including metoprolol , were taken     Review of Systems  Respiratory:  Positive for cough.    Per HPI      Objective:    BP (!) 160/110 (BP Location: Left Arm, Patient Position: Sitting)   Pulse (!) 110   Temp 98.4 F (36.9 C) (Temporal)   Ht 5' 2 (1.575 m)   Wt 163 lb (73.9 kg)   SpO2 95%   BMI 29.81 kg/m    Physical Exam Vitals and nursing note reviewed.  Constitutional:      General: She is in acute distress.     Appearance: She is ill-appearing.  HENT:     Head: Normocephalic and atraumatic.     Right Ear: External ear normal.     Left Ear: External ear normal.     Mouth/Throat:     Mouth: Mucous membranes are moist.  Eyes:     Extraocular Movements: Extraocular movements intact.  Cardiovascular:     Rate and Rhythm: Regular rhythm. Tachycardia present.     Pulses: Normal pulses.   Pulmonary:     Effort: No respiratory distress.     Breath sounds: No wheezing, rhonchi or rales.     Comments: Incresed  WOB, productive cough, cannot complete sentences without stopping to take a breath Musculoskeletal:        General: Normal range of motion.     Cervical back: Normal range of motion.     Right lower leg: Edema (+1 non pitting) present.     Left lower leg: Edema (+1 nonpitting) present.  Lymphadenopathy:     Cervical: No cervical adenopathy.  Skin:    Coloration: Skin is pale.  Neurological:     General: No focal deficit present.     Mental Status: She is alert and oriented to person, place, and time.  Psychiatric:        Mood and Affect: Mood normal.        Thought Content: Thought content normal.    EKG: Reviewed and interpreted by me Indication: SOB, chest pain, tachycardia Rate: 108 Interpretation: T wave abnormalities Changes from previous: increased T wave abormalities  Results for orders placed or performed in visit on 08/01/24  POC COVID-19 BinaxNow  Result Value Ref Range   SARS Coronavirus 2 Ag Negative Negative  POCT Influenza A/B  Result Value Ref Range   Influenza A, POC Negative  Negative   Influenza B, POC Negative Negative        Assessment & Plan:   Assessment and Plan Assessment & Plan Acute respiratory Distress, SOB, subacute cough Persistent dyspnea, productive cough, and retrosternal chest pain. Negative COVID and influenza tests. Differential includes fluid overload due to CKD and congestive heart failure. O2 at 3lpm given, HR decreased by about 20 points, color improved ASA 81 mg x 4 given  Chronic Heart Failure with reduced ejection fraction (HFrEF = < 40%), tachycardia, heart assist device in place Concern for fluid overload contributing to respiratory symptoms. Abnormal EKG noted. - Monitor for signs of fluid overload.  Chronic kidney disease, stage 4 Stage 4 CKD with potential fluid overload contributing to  respiratory symptoms. Not on dialysis  Essential HTN Has not taken medications today BP uncontrolled at 160/110  Hx PE, Hx MI Not anticoagulated Heart assist device in place Concern for repeat PE, ACS given clinical presentation, history, current vital signs, and EKG with significant T wave abnormalities  To [place] via  [facility], on 3lpm O2, report to them and care assumed by [facility]     Orders Placed This Encounter  Procedures   POC COVID-19 BinaxNow   POCT Influenza A/B   EKG 12-Lead     Meds ordered this encounter  Medications   aspirin  EC tablet 81 mg    Return for HFU once released from hospital.  Corean LITTIE Ku, FNP  "

## 2024-08-01 NOTE — Patient Instructions (Signed)
 Please go directly to the ER for further evaluation and management.

## 2024-08-01 NOTE — Telephone Encounter (Signed)
 Pt heading in for 9a appt at this time.   FYI Only or Action Required?: FYI only for provider: appointment scheduled on 08/02/24.  Patient was last seen in primary care on 06/30/2024 by Purcell Emil Schanz, MD.  Called Nurse Triage reporting Cough.  Symptoms began Thanksgiving.  Interventions attempted: Prescription medications: augmenting 12/4, cough drops, no relief.  Symptoms are: gradually worsening.  Triage Disposition: See Physician Within 24 Hours  Patient/caregiver understands and will follow disposition?: Yes  Copied from CRM #[id number]. Topic: Clinical - Red Word Triage >> Aug 01, 2024  8:22 AM Deleta RAMAN wrote: Red Word that prompted transfer to Nurse Triage: coughing brown mucus since thanksgiving Reason for Disposition  SEVERE coughing spells (e.g., whooping sound after coughing, vomiting after coughing)  Answer Assessment - Initial Assessment Questions 1. ONSET: When did the cough begin?      Thanksgiving, took Augmentin  starting 12/4, improved for 2-3 weeks then cough returned. Taking cough drops with no relief  2. SEVERITY: How bad is the cough today?      Severe, pt having several coughing spells while on the phone with triage nurse.   3. SPUTUM: Describe the color of your sputum (e.g., none, dry cough; clear, white, yellow, green)     Light brown, thick  4. HEMOPTYSIS: Are you coughing up any blood? If Yes, ask: How much? (e.g., flecks, streaks, tablespoons, etc.)     Denies  5. DIFFICULTY BREATHING: Are you having difficulty breathing? If Yes, ask: How bad is it? (e.g., mild, moderate, severe)      Denies, pt noted to be speaking in full sentences, pt reports she is ambulatory   6. FEVER: Do you have a fever? If Yes, ask: What is your temperature, how was it measured, and when did it start?     Denies  7. CARDIAC HISTORY: Do you have any history of heart disease? (e.g., heart attack, congestive heart failure)      Extensive, see pt  history.   8. LUNG HISTORY: Do you have any history of lung disease?  (e.g., pulmonary embolus, asthma, emphysema)     COPD  10. OTHER SYMPTOMS: Do you have any other symptoms? (e.g., runny nose, wheezing, chest pain)       Denies  12. TRAVEL: Have you traveled out of the country in the last month? (e.g., travel history, exposures)       Denies  Protocols used: Cough - Acute Productive-A-AH

## 2024-08-01 NOTE — Telephone Encounter (Signed)
 LVM for patients son, to make him aware of todays visit and patient going to the ED per provider.

## 2024-08-01 NOTE — H&P (Signed)
 " History and Physical    [name] FMW:[id number] DOB: Nov 30, 1945 DOA: 08/01/2024  PCP: Purcell Emil Schanz, MD  Patient coming from: Home via PCPs office  I have personally briefly reviewed patient's old medical records in [facility]  Chief Complaint: Shortness of breath/cough  HPI: [name] is a 79 y.o. female with medical history significant of COPD, CAD, ischemic cardiomyopathy, CKD 3B, chronic systolic heart failure status post [facility] Jude ICD 2011/08/19, ischemic cardiomyopathy, GERD presenting to the ED with a 1.5 week history of a productive cough of brownish sputum, sore throat, shortness of breath worsening on exertion, wheezing, chest tightness.  Patient denies any fevers or chills, no nausea or vomiting, no change in chronic constipation, no diarrhea, no lightheadedness, no melena, no hematochezia, no hematemesis.  Patient seen at PCPs office where she states her blood pressure was elevated, she was hypoxic and tachycardic and EMS was called and patient transported to the ED.  It is noted that O2 sats were decreased and EMS gave patient DuoNebs and Solu-Medrol  with some improvement with wheezing.  Patient noted to have been tested at PCPs office for COVID, flu, RSV which were all negative.  ED Course: On presentation to the ED patient was given magnesium  as well for her wheezing.  Patient noted to have a leukocytosis, elevated lactic acid level, proBNP of 07/31/2001.  Comprehensive metabolic profile with a potassium of 3.0, BUN of 26, creatinine of 1.88, alk phosphatase of 151 otherwise within normal limits.  Lipase level noted at 53.  Urinalysis pending.  Chest x-ray done negative for any acute abnormalities.  EDP considered a CT angiogram chest however due to CKD unable to obtain CT angiogram chest.  ED physician noted patient with rhonchorous breath sounds and felt patient likely had a community-acquired pneumonia versus a viral pneumonia.  Patient initially required  nonrebreather on presentation to the ED.  CT chest without contrast was ordered and hospitalist were consulted to admit the patient for further evaluation and management  Review of Systems: As per HPI otherwise all other systems reviewed and are negative.  Past Medical History:  Diagnosis Date   Abdominal pain, left lower quadrant 08/14/2009   Qualifier: Diagnosis of  By: Drucilla BANANA, Pam     Abnormal CT scan, stomach    Abnormal LFTs 06/19/2017   AICD (automatic cardioverter/defibrillator) present    Dr.Allred follows   Aortic arch pseudoaneurysm    a. followed by Dr. Fleeta Ochoa.   Arthritis    Asthma    Benign neoplasm of colon    CAD (coronary artery disease) 02/2009   a. anterior STEMI rx with BMS to prox LAD in 02/2009. b. ISR s/p PTCA 06/2009. c. ISR s/p thrombectomy & PTCA 03/2010 due to late stent thrombosis. // Myoview  04/2019: EF 28, ant, ant-sept, inf-sept scar, no ischemia, high risk (stable>>cont med Rx)    Cardiomyopathy, ischemic 06/12/2011   Chronic renal insufficiency    stage 3   Chronic systolic CHF (congestive heart failure) (HCC)    a. s/p ST. Jude ICD 19-Aug-2011.   Chronic systolic heart failure (HCC) 08/05/2011   Colon polyp    Complication of anesthesia    COPD (chronic obstructive pulmonary disease) (HCC)    CORONARY ARTERY DISEASE 04/24/2009   Qualifier: Diagnosis of  By: Ever Riggers, Amy S    DDD (degenerative disc disease), lumbar 05/16/2011   Depression    since my son died in 2024-11-16 2017/08/18)   Diverticulosis    DIVERTICULOSIS-COLON  08/14/2009   Qualifier: Diagnosis of  By: Ever Riggers, Amy S    DYSPHAGIA UNSPECIFIED 04/24/2009   Qualifier: Diagnosis of  By: Drucilla BANANA, Pam     Dyspnea    when I lay down at night    Essential hypertension 09/05/2009   Qualifier: Diagnosis of  By: Thalia CMA, Jennifer     Gastric polyp    GERD 10/19/2007   Qualifier: Diagnosis of  By: Kimble RN, [place]: Diagnosis of  By: Drucilla BANANA, Pam     GERD  (gastroesophageal reflux disease)    GI bleed    a. h/o GIB on DAPT, now on ASA only.   Headache 01/03/2013   Hearing loss    left ear   History of pulmonary embolus (PE) 01/02/2011   Hyperlipemia    Hyperlipidemia, unspecified 04/24/2009   Qualifier: Diagnosis of  By: Ever PA-c, Amy S    Hypertension    ICD-St.Jude 08/06/2011   S/p St. Jude ICD placement 08/05/11    Insomnia    Irritable bowel syndrome    Ischemic cardiomyopathy    EF 10-15%   Memory loss    Migraine headache without aura    Myocardial infarct (HCC) 2011 x 2   Dr. Cooper,cardiology    PERSONAL HX COLONIC POLYPS 04/24/2009   Qualifier: Diagnosis of  By: Ever Riggers, Amy S November, 2011 colonoscopy demonstrated a sessile cecal polyp and sigmoid polyp    Pneumonia    PONV (postoperative nausea and vomiting)    Pre-diabetes    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/05/2012   Pulmonary embolism (HCC) 2011   Stroke [facility])    'When I was young. no residual   Thoracic aortic aneurysm 03/31/2018   TOBACCO ABUSE 04/04/2009   Qualifier: Diagnosis of  By: Parthenia RIGGERS Olivia Mason  Qualifier: Diagnosis of  By: Morris, MD, CODY Debby Lenis    Transfusion history    ?'12 or '13   UNSPECIFIED ANEMIA 07/03/2010   Qualifier: Diagnosis of  By: Arlyss Math     Unspecified mastoiditis     Past Surgical History:  Procedure Laterality Date   ANGIOPLASTY  07/02/09, 04/01/10   BILATERAL SALPINGOOPHORECTOMY     CAD( bare metal stent)  02/2009   x 1   CAROTID-SUBCLAVIAN BYPASS GRAFT Left 03/31/2018   Procedure: LEFT SUBCLAVIAN ARTERY BYPASS GRAFT;  Surgeon: Serene Gaile ORN, MD;  Location: [facility] OR;  Service: Vascular;  Laterality: Left;   CERVICAL SPINE SURGERY  08/08   COLONOSCOPY WITH PROPOFOL  N/A 04/29/2016   Procedure: COLONOSCOPY WITH PROPOFOL ;  Surgeon: Elspeth Deward Naval, MD;  Location: [facility] ENDOSCOPY;  Service: Gastroenterology;  Laterality: N/A;   ESOPHAGOGASTRODUODENOSCOPY N/A 12/09/2016   Procedure:  ESOPHAGOGASTRODUODENOSCOPY (EGD);  Surgeon: Naval Elspeth Deward, MD;  Location: [facility];  Service: Gastroenterology;  Laterality: N/A;   ESOPHAGOGASTRODUODENOSCOPY (EGD) WITH PROPOFOL  N/A 04/29/2016   Procedure: ESOPHAGOGASTRODUODENOSCOPY (EGD) WITH PROPOFOL ;  Surgeon: Elspeth Deward Naval, MD;  Location: [facility];  Service: Gastroenterology;  Laterality: N/A;   EUS N/A 05/15/2016   Procedure: UPPER ENDOSCOPIC ULTRASOUND (EUS) RADIAL;  Surgeon: Toribio SHAUNNA Cedar, MD;  Location: [facility];  Service: Endoscopy;  Laterality: N/A;   ICD GENERATOR CHANGEOUT N/A 11/04/2022   Procedure: ICD GENERATOR CHANGEOUT;  Surgeon: Nancey, Eulas BRAVO, MD;  Location: [facility] INVASIVE CV LAB;  Service: Cardiovascular;  Laterality: N/A;   IMPLANTABLE CARDIOVERTER DEFIBRILLATOR IMPLANT N/A 08/05/2011   Primary prevention SJM ICD implanted,  Analyze ST study patient   INNER EAR SURGERY  left x 17   LUMBAR DISC SURGERY  02/2008   fusion   NASAL SEPTUM SURGERY     RIGHT HEART CATH N/A 12/11/2021   Procedure: RIGHT HEART CATH;  Surgeon: Wonda Sharper, MD;  Location: [facility] INVASIVE CV LAB;  Service: Cardiovascular;  Laterality: N/A;   THORACIC AORTIC ENDOVASCULAR STENT GRAFT N/A 03/31/2018   Procedure: THORACIC AORTIC ENDOVASCULAR STENT GRAFT;  Surgeon: Serene Gaile ORN, MD;  Location: [facility] OR;  Service: Vascular;  Laterality: N/A;   TOTAL ABDOMINAL HYSTERECTOMY     complete    Social History  reports that she has quit smoking. Her smoking use included cigarettes. She has a 3.9 pack-year smoking history. She has never used smokeless tobacco. She reports that she does not drink alcohol and does not use drugs.  Allergies[1]  Family History  Problem Relation Age of Onset   Colon cancer Mother    Other Brother        tumor in his brain   Bladder Cancer Brother    Throat cancer Brother    Cancer Son        kidney, spread to his liver   Breast cancer Cousin    Mother deceased age 107 from cancer.  Father  deceased age 50 from MVA.  Son deceased age 32 from cancer.  Prior to Admission medications  Medication Sig Start Date End Date Taking? Authorizing Provider  acetaminophen  (TYLENOL ) 500 MG tablet Take 2 tablets (1,000 mg total) by mouth every 6 (six) hours as needed (back pain.). Patient taking differently: Take 1,000 mg by mouth daily as needed for mild pain (pain score 1-3) or moderate pain (pain score 4-6). 08/11/22  Yes Redwine, Madison A, PA-C  amitriptyline  (ELAVIL ) 50 MG tablet TAKE 1 TABLET (50 MG TOTAL) BY MOUTH AT BEDTIME. 02/15/24  Yes Sagardia, Emil Schanz, MD  aspirin  81 MG EC tablet Take 81 mg by mouth at bedtime.   Yes [provider]  fluticasone  (FLONASE ) 50 MCG/ACT nasal spray Place 2 sprays into both nostrils daily. Patient taking differently: Place 1 spray into both nostrils in the morning and at bedtime. 06/30/24  Yes Sagardia, Emil Schanz, MD  hydrALAZINE  (APRESOLINE ) 10 MG tablet TAKE 1 TABLET BY MOUTH 3 TIMES DAILY. KEEP UPCOMING APPOINTMENT FOR REFILLS 04/19/24  Yes Wonda Sharper, MD  isosorbide  mononitrate (IMDUR ) 30 MG 24 hr tablet Take 1 tablet (30 mg total) by mouth daily. 06/17/24  Yes Weaver, Scott T, PA-C  metoprolol  tartrate (LOPRESSOR ) 25 MG tablet Take 2 tablets (50 mg total) by mouth 2 (two) times daily. 05/25/24 08/01/24 Yes Weaver, Scott T, PA-C  nitroGLYCERIN  (NITROSTAT ) 0.4 MG SL tablet Place 1 tablet (0.4 mg total) under the tongue every 5 (five) minutes as needed for chest pain. 05/13/24  Yes Cindy Garnette POUR, MD  polyethylene glycol powder (GLYCOLAX /MIRALAX ) powder Take 17 g by mouth daily as needed for mild constipation. 07/08/17  Yes Melonie Colonel, Mikel HERO, MD  rosuvastatin  (CRESTOR ) 20 MG tablet TAKE 1 TABLET (20 MG TOTAL) BY MOUTH AT BEDTIME. KEEP UPCOMING APPOINTMENT FOR REFILLS 06/17/24  Yes Wonda Sharper, MD  STIOLTO RESPIMAT  2.5-2.5 MCG/ACT AERS INHALE 2 PUFFS INTO THE LUNGS DAILY. NEED APPOINTMENT FOR FURTHER REFILLS Patient taking  differently: Inhale 1 puff into the lungs daily as needed (wheezing/SOB). 02/13/23  Yes Hunsucker, Donnice SAUNDERS, MD  topiramate  (TOPAMAX ) 50 MG tablet TAKE 1 TABLET (50 MG TOTAL) BY MOUTH DAILY. Patient taking differently: Take 50 mg by mouth at bedtime. 11/17/23  Yes Sagardia, [place],  MD  traMADol  (ULTRAM ) 50 MG tablet Take 1 tablet (50 mg total) by mouth every 12 (twelve) hours as needed. Can use up to twice daily if needed; do not exceed 30 tabs in 1 month Patient taking differently: Take 50 mg by mouth at bedtime as needed for moderate pain (pain score 4-6) or severe pain (pain score 7-10). 03/16/24  Yes Emeline Search C, DO  ciprofloxacin  (CIPRO ) 250 MG tablet Take 250 mg by mouth 2 (two) times daily. Patient not taking: Reported on 08/01/2024 05/18/24   [provider]  furosemide  (LASIX ) 40 MG tablet Take 1 tablet (40 mg total) by mouth daily. Patient not taking: Reported on 08/01/2024 12/09/21   Lelon Hamilton T, [place]    Physical Exam: Vitals:   08/01/24 1334 08/01/24 1400 08/01/24 1432 08/01/24 1524  BP:  134/79    Pulse: (!) 117 (!) 115    Resp: 19 (!) 24    Temp:   98.1 F (36.7 C)   TempSrc:   Oral   SpO2: 90% 92%  98%  Weight:      Height:        Constitutional: NAD, calm, comfortable Vitals:   08/01/24 1334 08/01/24 1400 08/01/24 1432 08/01/24 1524  BP:  134/79    Pulse: (!) 117 (!) 115    Resp: 19 (!) 24    Temp:   98.1 F (36.7 C)   TempSrc:   Oral   SpO2: 90% 92%  98%  Weight:      Height:       Eyes: PERRL, lids and conjunctivae normal ENMT: Mucous membranes are dry. Posterior pharynx clear of any exudate or lesions.Normal dentition.  Neck: normal, supple, no masses, no thyromegaly Respiratory: Some scattered coarse breath sounds.  Minimal expiratory wheezing.  No crackles.  Speaking in full sentences.  No use of accessory muscles of respiration.  Cardiovascular: Tachycardia.  No murmurs rubs or gallops.  No JVD.  No lower extremity pitting  edema. Abdomen: no tenderness, no masses palpated. No hepatosplenomegaly. Bowel sounds positive.  Musculoskeletal: no clubbing / cyanosis. No joint deformity upper and lower extremities. Good ROM, no contractures. Normal muscle tone.  Skin: no rashes, lesions, ulcers. No induration Neurologic: CN 2-12 grossly intact. Sensation intact, DTR normal. Strength 5/5 in all 4.  Psychiatric: Normal judgment and insight. Alert and oriented x 3. Normal mood.   Labs on Admission: I have personally reviewed following labs and imaging studies  CBC: Recent Labs  Lab 08/01/24 1115  WBC 17.4*  NEUTROABS 7.5  HGB 12.0  HCT 36.3  MCV 103.1*  PLT 277    Basic Metabolic Panel: Recent Labs  Lab 08/01/24 1115  NA 141  K 3.0*  CL 105  CO2 21*  GLUCOSE 158*  BUN 26*  CREATININE 1.88*  CALCIUM  9.6    GFR: Estimated Creatinine Clearance: 24.3 mL/min (A) (by C-G formula based on SCr of 1.88 mg/dL (H)).  Liver Function Tests: Recent Labs  Lab 08/01/24 1115  AST 29  ALT 23  ALKPHOS 151*  BILITOT 0.4  PROT 7.9  ALBUMIN  4.1    Urine analysis:    Component Value Date/Time   COLORURINE YELLOW 05/12/2024 0953   APPEARANCEUR CLEAR 05/12/2024 0953   LABSPEC >1.046 (H) 05/12/2024 0953   PHURINE 6.0 05/12/2024 0953   GLUCOSEU NEGATIVE 05/12/2024 0953   GLUCOSEU NEGATIVE 04/20/2023 1047   HGBUR NEGATIVE 05/12/2024 0953   BILIRUBINUR NEGATIVE 05/12/2024 0953   BILIRUBINUR negative 04/18/2024 1135   BILIRUBINUR negative  11/21/2021 1416   KETONESUR NEGATIVE 05/12/2024 0953   PROTEINUR TRACE (A) 05/12/2024 0953   UROBILINOGEN 0.2 04/18/2024 1135   UROBILINOGEN 0.2 04/20/2023 1047   NITRITE NEGATIVE 05/12/2024 0953   LEUKOCYTESUR NEGATIVE 05/12/2024 0953    Radiological Exams on Admission: CT Chest Wo Contrast Result Date: 08/01/2024 EXAM: CT CHEST WITHOUT CONTRAST 08/01/2024 02:24:44 PM TECHNIQUE: CT of the chest was performed without the administration of intravenous contrast.  Multiplanar reformatted images are provided for review. Automated exposure control, iterative reconstruction, and/or weight based adjustment of the mA/kV was utilized to reduce the radiation dose to as low as reasonably achievable. COMPARISON: CT chest abdomen and pelvis 05/12/2024. CLINICAL HISTORY: Chest wall pain, nontraumatic, infection or inflammation suspected, xray done; Productive cough with hypoxia and shortness of breath. X-ray does not show pneumonia but clinically very high concern for pneumonia. Cannot get PE study due to kidney function. Hospitalist requested noncontrasted CT. Cough. FINDINGS: MEDIASTINUM: Pacemaker is present. Coronary and aortic atherosclerotic calcifications are noted. Thoracic aortic arch graft/stent is present. The heart is mildly enlarged. The central airways are clear. LYMPH NODES: No mediastinal, hilar or axillary lymphadenopathy. LUNGS AND PLEURA: There is minimal atelectasis in the posterior third of the left lung bases. There are patchy peripheral ground glass opacities and reticular opacities in the posterior inferior left lower lobe, new from prior. No pleural effusion or pneumothorax. SOFT TISSUES/BONES: The bones are diffusely osteopenic. Mild T9 compression deformity is unchanged. Degenerative changes affect the spine. No acute abnormality of the soft tissues. UPPER ABDOMEN: There is a small hiatal hernia. Subcentimeter hypodensity in the dome of the bladder is too small to characterize, likely a cyst. Limited images of the upper abdomen demonstrates no other acute abnormality. IMPRESSION: 1. New patchy peripheral ground-glass and reticular opacities in the posterior inferior left lower lobe, compatible with infection. Electronically signed by: Greig Pique MD 08/01/2024 03:34 PM EST RP Workstation: [id number]   DG Chest Portable 1 View Result Date: 08/01/2024 CLINICAL DATA:  Shortness of breath.  Cough. EXAM: PORTABLE CHEST 1 VIEW COMPARISON:  05/19/2024. FINDINGS:  The heart size and mediastinal contours are unchanged. Stable single lead AICD. Endoluminal stent graft within the aortic arch. No overt pulmonary edema, focal consolidation, pleural effusion, or pneumothorax. No acute osseous abnormality. IMPRESSION: No acute cardiopulmonary findings. Electronically Signed   By: Harrietta Sherry M.D.   On: 08/01/2024 12:12    EKG: Independently reviewed.  Sinus tachycardia, supraventricular bigeminy, RBBB.  Assessment/Plan Principal Problem:   Acute respiratory failure with hypoxia (HCC) Active Problems:   CAP (community acquired pneumonia)   COPD with acute exacerbation (HCC)   UNSPECIFIED ANEMIA   TOBACCO ABUSE   Essential hypertension   GERD   IBS   History of pulmonary embolus (PE)   Chronic renal insufficiency   AICD (automatic cardioverter/defibrillator) present   Ischemic cardiomyopathy   COPD (chronic obstructive pulmonary disease) (HCC)   Dyslipidemia   Stage 3b chronic kidney disease (HCC)   HFrEF (heart failure with reduced ejection fraction) (HCC)   Constipation   Chronic pain syndrome   Hypertensive heart and kidney disease with HF and with CKD stage IV (HCC)   Mixed hyperlipidemia   Diabetes mellitus due to underlying condition with stage 4 chronic kidney disease, without long-term current use of insulin (HCC)   Hypokalemia   SIRS (systemic inflammatory response syndrome) (HCC)   #1 acute respiratory distress with hypoxia likely multifactorial secondary to community-acquired pneumonia and acute COPD exacerbation. -Patient at baseline not on home O2. -  Patient noted to have presented to PCPs office with concerns for upper respiratory infection with a 1.5-week of productive cough, shortness of breath on exertion, wheezing, sore throat. - Patient at PCPs office noted to be hypoxic, EMS was called patient placed on O2, and given DuoNebs, IV Solu-Medrol .  Patient noted to have received magnesium  in the ED. - Patient noted initially to  have required a nonrebreather and subsequently transition to 2 to 3 L nasal cannula. - Chest x-ray done negative for any acute abnormalities. - CT chest without contrast ordered due to patient's CKD and inability to obtain a CT angiogram chest to rule out PE. - Lower extremity Dopplers ordered and pending. - Check ABG, - Check a sputum Gram stain and culture, check a urine Legionella antigen, check urine pneumococcus antigen, check blood cultures x 2. - Place empirically on IV Rocephin  and azithromycin . - Placed on Claritin , PPI, Flonase , IV Solu-Medrol , scheduled Xopenex  and Atrovent  nebs, Brovana , Pulmicort . - Supportive care.  2.  SIRS -Patient met criteria for SIRS on admission with a lactic acidosis, tachycardia, leukocytosis. - Check blood cultures x 2. - UA ordered and pending. - Chest x-ray negative for any acute abnormalities. - CT chest without contrast ordered by EDP and pending. - Place empirically on IV Rocephin  and IV azithromycin  to cover for presumed community-acquired pneumonia.  3.  GERD -PPI.  4.  Hypertension - Resume home regimen Lopressor , Lasix , hydralazine , Imdur .  5.  CKD stage 3b -Stable.  6.  CAD/ischemic cardiomyopathy/status post AICD/history of chronic systolic CHF - Patient euvolemic on exam.   - EKG unchanged from prior EKG.   - proBNP elevated however patient looks clinically dry on exam.   - Patient elevated troponin of 26. - 2D echo from 05/13/2024 with a EF of 25 to 30%, wall motion abnormalities.  - Resume home regimen Lopressor , Imdur , Crestor , hydralazine , Lasix , aspirin .  7.  Hypokalemia -K-Dur 40 mEq p.o. every 4 hours x 2 doses. - Check a mag in the AM.  DVT prophylaxis: Heparin  Code Status:  DNR Family Communication:  Updated patient.  No family at bedside. Disposition Plan:   Patient is from:  Home  Anticipated DC to:  [facility]  Anticipated DC date:  TBD  Anticipated DC barriers: Clinical improve Consults called:  None Admission  status:  Place in observation/progressive care  Severity of Illness: The appropriate patient status for this patient is OBSERVATION. Observation status is judged to be reasonable and necessary in order to provide the required intensity of service to ensure the patient's safety. The patient's presenting symptoms, physical exam findings, and initial radiographic and laboratory data in the context of their medical condition is felt to place them at decreased risk for further clinical deterioration. Furthermore, it is anticipated that the patient will be medically stable for discharge from the hospital within 2 midnights of admission.     Toribio Hummer MD [facility]  How to contact the [facility] Attending or Consulting provider 7A - 7P or covering provider during after hours 7P -7A, for this patient?   Check the care team in [facility] and look for a) attending/consulting [facility] provider listed and b) the [facility] team listed Log into [email or web address] and use [employer] universal password to access. If you do not have the password, please contact the hospital operator. Locate the [facility] provider you are looking for under [facility] and page to a number that you can be directly reached. If you still have difficulty reaching the provider, please page  the [facility] (Director on Call) for the Hospitalists listed on amion for assistance.  08/01/2024, 4:24 PM        [1]  Allergies Allergen Reactions   Sulfa Antibiotics Other (See Comments)    Unknown, childhood allergy    Sulfonamide Derivatives Other (See Comments)    UNSURE   Zestril  [Lisinopril ] Cough   "

## 2024-08-01 NOTE — ED Provider Notes (Signed)
 " [facility] AT [facility] Provider Note   CSN: [id number] Arrival date & time: 08/01/24  1028     Patient presents with: No chief complaint on file.   [name] is a 79 y.o. female.   The history is provided by the patient, the EMS personnel and medical records. No language interpreter was used.  Shortness of Breath Severity:  Severe Onset quality:  Gradual Duration:  3 days Timing:  Constant Progression:  Waxing and waning Chronicity:  New Context: URI   Relieved by:  Oxygen Worsened by:  Coughing and deep breathing Ineffective treatments:  None tried Associated symptoms: chest pain, cough, sputum production and wheezing   Associated symptoms: no abdominal pain, no fever, no headaches, no hemoptysis, no neck pain, no rash and no vomiting   Risk factors: hx of PE/DVT        Prior to Admission medications  Medication Sig Start Date End Date Taking? Authorizing Provider  acetaminophen  (TYLENOL ) 500 MG tablet Take 2 tablets (1,000 mg total) by mouth every 6 (six) hours as needed (back pain.). 08/11/22   Redwine, Madison A, PA-C  amitriptyline  (ELAVIL ) 50 MG tablet TAKE 1 TABLET (50 MG TOTAL) BY MOUTH AT BEDTIME. 02/15/24   Purcell Emil Schanz, MD  aspirin  81 MG EC tablet Take 81 mg by mouth at bedtime.    [provider]  ciprofloxacin  (CIPRO ) 250 MG tablet Take 250 mg by mouth 2 (two) times daily. 05/18/24   [provider]  fluticasone  (FLONASE ) 50 MCG/ACT nasal spray Place 2 sprays into both nostrils daily. 06/30/24   Purcell Emil Schanz, MD  furosemide  (LASIX ) 40 MG tablet Take 1 tablet (40 mg total) by mouth daily. 12/09/21   Weaver, Scott T, PA-C  hydrALAZINE  (APRESOLINE ) 10 MG tablet TAKE 1 TABLET BY MOUTH 3 TIMES DAILY. KEEP UPCOMING APPOINTMENT FOR REFILLS 04/19/24   Wonda Sharper, MD  isosorbide  mononitrate (IMDUR ) 30 MG 24 hr tablet Take 1 tablet (30 mg total) by mouth daily. 06/17/24   Lelon Hamilton T, PA-C   metoprolol  tartrate (LOPRESSOR ) 25 MG tablet Take 2 tablets (50 mg total) by mouth 2 (two) times daily. 05/25/24 08/01/24  Lelon Hamilton T, PA-C  nitroGLYCERIN  (NITROSTAT ) 0.4 MG SL tablet Place 1 tablet (0.4 mg total) under the tongue every 5 (five) minutes as needed for chest pain. 05/13/24   Cindy Garnette POUR, MD  polyethylene glycol powder (GLYCOLAX /MIRALAX ) powder Take 17 g by mouth daily as needed for mild constipation. 07/08/17   Melonie Colonel, Mikel HERO, MD  rosuvastatin  (CRESTOR ) 20 MG tablet TAKE 1 TABLET (20 MG TOTAL) BY MOUTH AT BEDTIME. KEEP UPCOMING APPOINTMENT FOR REFILLS 06/17/24   Wonda Sharper, MD  STIOLTO RESPIMAT  2.5-2.5 MCG/ACT AERS INHALE 2 PUFFS INTO THE LUNGS DAILY. NEED APPOINTMENT FOR FURTHER REFILLS Patient taking differently: as needed. 02/13/23   Hunsucker, Donnice SAUNDERS, MD  topiramate  (TOPAMAX ) 50 MG tablet TAKE 1 TABLET (50 MG TOTAL) BY MOUTH DAILY. 11/17/23   Purcell Emil Schanz, MD  traMADol  (ULTRAM ) 50 MG tablet Take 1 tablet (50 mg total) by mouth every 12 (twelve) hours as needed. Can use up to twice daily if needed; do not exceed 30 tabs in 1 month Patient taking differently: Take 50 mg by mouth every 12 (twelve) hours as needed for moderate pain (pain score 4-6). 03/16/24   [name], DO    Allergies: Sulfa antibiotics, Sulfonamide derivatives, and Zestril  [lisinopril ]    Review of Systems  Constitutional:  Positive  for chills and fatigue. Negative for fever.  HENT:  Positive for congestion.   Respiratory:  Positive for cough, sputum production, chest tightness, shortness of breath and wheezing. Negative for hemoptysis and stridor.   Cardiovascular:  Positive for chest pain and leg swelling (chronic and unchanged per pt). Negative for palpitations.  Gastrointestinal:  Negative for abdominal pain, constipation, diarrhea, nausea and vomiting.  Genitourinary:  Negative for dysuria.  Musculoskeletal:  Negative for back pain, neck pain and neck stiffness.  Skin:   Negative for rash and wound.  Neurological:  Negative for light-headedness and headaches.  Psychiatric/Behavioral:  Negative for agitation and confusion.   All other systems reviewed and are negative.   Updated Vital Signs Ht 5' 2 (1.575 m)   Wt 76.2 kg   BMI 30.73 kg/m   Physical Exam Vitals and nursing note reviewed.  Constitutional:      General: She is not in acute distress.    Appearance: She is well-developed. She is not ill-appearing, toxic-appearing or diaphoretic.  HENT:     Head: Normocephalic and atraumatic.     Mouth/Throat:     Mouth: Mucous membranes are moist.  Eyes:     Conjunctiva/sclera: Conjunctivae normal.     Pupils: Pupils are equal, round, and reactive to light.  Cardiovascular:     Rate and Rhythm: Regular rhythm. Tachycardia present.     Heart sounds: No murmur heard. Pulmonary:     Effort: Pulmonary effort is normal. Tachypnea present. No respiratory distress.     Breath sounds: Wheezing and rhonchi present. No rales.  Chest:     Chest wall: No tenderness.  Abdominal:     Palpations: Abdomen is soft.     Tenderness: There is no abdominal tenderness.  Musculoskeletal:        General: No swelling.     Cervical back: Neck supple.     Right lower leg: Edema present.     Left lower leg: Edema present.  Skin:    General: Skin is warm and dry.     Capillary Refill: Capillary refill takes less than 2 seconds.     Findings: No erythema.  Neurological:     General: No focal deficit present.     Mental Status: She is alert.  Psychiatric:        Mood and Affect: Mood normal.     (all labs ordered are listed, but only abnormal results are displayed) Labs Reviewed  CBC WITH DIFFERENTIAL/PLATELET - Abnormal; Notable for the following components:      Result Value   WBC 17.4 (*)    RBC 3.52 (*)    MCV 103.1 (*)    MCH 34.1 (*)    Lymphs Abs 8.6 (*)    Abs Immature Granulocytes 0.08 (*)    All other components within normal limits   COMPREHENSIVE METABOLIC PANEL WITH GFR - Abnormal; Notable for the following components:   Potassium 3.0 (*)    CO2 21 (*)    Glucose, Bld 158 (*)    BUN 26 (*)    Creatinine, Ser 1.88 (*)    Alkaline Phosphatase 151 (*)    GFR, Estimated 27 (*)    All other components within normal limits  LIPASE, BLOOD - Abnormal; Notable for the following components:   Lipase 53 (*)    All other components within normal limits  PRO BRAIN NATRIURETIC PEPTIDE - Abnormal; Notable for the following components:   Pro Brain Natriuretic Peptide 1,403.0 (*)    All  other components within normal limits  I-STAT CG4 LACTIC ACID, ED - Abnormal; Notable for the following components:   Lactic Acid, Venous 2.0 (*)    All other components within normal limits  I-STAT CG4 LACTIC ACID, ED - Abnormal; Notable for the following components:   Lactic Acid, Venous 2.7 (*)    All other components within normal limits  TROPONIN T, HIGH SENSITIVITY - Abnormal; Notable for the following components:   Troponin T High Sensitivity 26 (*)    All other components within normal limits  PATHOLOGIST SMEAR REVIEW  URINALYSIS, W/ REFLEX TO CULTURE (INFECTION SUSPECTED)    EKG: EKG Interpretation Date/Time:  Monday August 01 2024 10:45:40 EST Ventricular Rate:  112 PR Interval:  170 QRS Duration:  140 QT Interval:  360 QTC Calculation: 492 R Axis:   -73  Text Interpretation: Sinus tachycardia Supraventricular bigeminy RBBB and LAFB Probable left ventricular hypertrophy EKG appears similar to prior No STEMI Confirmed by Ginger Barefoot ([id number]) on 08/01/2024 10:53:31 AM  Radiology: [facility] Chest Portable 1 View Result Date: 08/01/2024 CLINICAL DATA:  Shortness of breath.  Cough. EXAM: PORTABLE CHEST 1 VIEW COMPARISON:  05/19/2024. FINDINGS: The heart size and mediastinal contours are unchanged. Stable single lead AICD. Endoluminal stent graft within the aortic arch. No overt pulmonary edema, focal consolidation, pleural effusion, or  pneumothorax. No acute osseous abnormality. IMPRESSION: No acute cardiopulmonary findings. Electronically Signed   By: Harrietta Sherry M.D.   On: 08/01/2024 12:12     Procedures   CRITICAL CARE Performed by: Lonni PARAS Shelie Lansing Total critical care time: 30 minutes Critical care time was exclusive of separately billable procedures and treating other patients. Critical care was necessary to treat or prevent imminent or life-threatening deterioration. Critical care was time spent personally by me on the following activities: development of treatment plan with patient and/or surrogate as well as nursing, discussions with consultants, evaluation of patient's response to treatment, examination of patient, obtaining history from patient or surrogate, ordering and performing treatments and interventions, ordering and review of laboratory studies, ordering and review of radiographic studies, pulse oximetry and re-evaluation of patient's condition.  Medications Ordered in the ED  magnesium  sulfate IVPB 2 g 50 mL (0 g Intravenous Stopped 08/01/24 1237)                                    Medical Decision Making Amount and/or Complexity of Data Reviewed Labs: ordered. Radiology: ordered.  Risk Prescription drug management.    [name] is a 79 y.o. female with a past medical history significant for CAD status post PCI, CHF ICD, CKD, hypertension, hyperlipidemia, asthma, previous pulmonary embolism, previous stroke, previous carotid subclavian bypass grafting and thoracic aortic-ism status post endovascular grafting, previous GI bleed, and GERD who presents from her pulmonologist office for further evaluation and management of respiratory distress with shortness of breath and chest discomfort.  According to patient, she has had URI symptoms for the last few weeks and has had some cough.  She says that she has some discomfort in her chest that she thought was reflux but has been having that on  and off.  She reports no nausea, vomiting and denies any constipation, diarrhea, or urinary changes.  Is not describing diaphoresis.  Does report that it is slightly more painful when she takes a deep breath.  Does not report it being sharp or going to her back.  She  is tachycardic and tachypneic on arrival but is afebrile.  Oxygen saturations were reportedly decreased and she is now on oxygen to keep her oxygen saturations in the 90s.  With EMS she was given DuoNeb and Solu-Medrol .  Wheezing reportedly has begun to improve with EMS.  On arrival, patient is describing the chest tightness and shortness of breath.  She does report URI symptoms with cough.  She tested negative for COVID and flu and RSV at her pulmonologist office.  On my exam, lungs do have rhonchi and faint wheeze.  Chest was nontender.  I did not appreciate a murmur initially.  Abdomen nontender.  She denies any abdominal pain nausea or vomiting.  Legs have some mild edema but have intact pulses.  Patient is tachycardic in the 110-120 range.  She is on a nonrebreather at this time.  EKG appears similar to prior and I do not suspect STEMI at this time.  As patient already has DuoNeb and Solu-Medrol  will give magnesium  given the wheezing.  With her history of heart failure will check a BNP and troponins.  Will order a CT PE study given her history of pulm embolism and the tachycardia shortness of breath and pleuritic chest discomfort.  Will hold on repeat viral testing given her negative COVID/flu/RSV test.  Will check labs.  Will keep her on oxygen and determine if she needs BiPAP or not.  Blood pressure is only in the 140s so do not suspect flash pulmonary edema from hypertension at this time.  Anticipate admission given her respiratory difficulties today once workup is completed.  12:07 PM GFR was just found to be below 30.  Will cancel the CT PE study for now and likely defer to admitting team if they want to do a VQ  scan.  Admitting team requested a CT scan without contrast to further evaluate for pneumonia and bilateral leg ultrasounds look for DVTs since she cannot get the contrast.  They will admit for further management.     Final diagnoses:  SOB (shortness of breath)  Tachycardia    Clinical Impression: 1. SOB (shortness of breath)   2. Tachycardia     Disposition: Admit  This note was prepared with assistance of Dragon voice recognition software. Occasional wrong-word or sound-a-like substitutions may have occurred due to the inherent limitations of voice recognition software.     Fama Muenchow, Lonni PARAS, MD 08/01/24 1448  "

## 2024-08-01 NOTE — ED Notes (Signed)
 Requested urine sample from patient, she stated xray took her to the restroom and is unable to provide a sample.

## 2024-08-01 NOTE — Telephone Encounter (Unsigned)
 Copied from CRM #[id number]. Topic: General - Other >> Aug 01, 2024 11:32 AM Winona SAUNDERS wrote: Pt son returning CMA [place] call. I informed the pt son, Mom came in to the office for cough, and was requested to go to the ED for resp distress.  I have informed the pt son that she is at the [facility] ED. He is now aware and will call around to gather more information as [place] was not avail to provide more information. Please give him a call when avail ro provide more information about today's appointment

## 2024-08-01 NOTE — ED Triage Notes (Signed)
 Pt via ems from [place] [facility], sob, wheezy,  2 duo nebs, not on home oxygen, flu swab was negative.  Recent sinusitis , hx of MI,  Cbg 120 BP [id number] on RA 100 on Neb ST 106, no chest pain, 10 mg alb 1 mg atrovent  125 solumedrol 24 G in L hand

## 2024-08-01 NOTE — Telephone Encounter (Signed)
LVM for patients son

## 2024-08-02 ENCOUNTER — Encounter (HOSPITAL_COMMUNITY): Payer: Self-pay | Admitting: Internal Medicine

## 2024-08-02 ENCOUNTER — Observation Stay (HOSPITAL_COMMUNITY)

## 2024-08-02 DIAGNOSIS — Z86711 Personal history of pulmonary embolism: Secondary | ICD-10-CM

## 2024-08-02 DIAGNOSIS — D649 Anemia, unspecified: Secondary | ICD-10-CM | POA: Insufficient documentation

## 2024-08-02 DIAGNOSIS — G894 Chronic pain syndrome: Secondary | ICD-10-CM | POA: Diagnosis present

## 2024-08-02 DIAGNOSIS — J449 Chronic obstructive pulmonary disease, unspecified: Secondary | ICD-10-CM

## 2024-08-02 DIAGNOSIS — E782 Mixed hyperlipidemia: Secondary | ICD-10-CM | POA: Diagnosis present

## 2024-08-02 DIAGNOSIS — I5020 Unspecified systolic (congestive) heart failure: Secondary | ICD-10-CM | POA: Diagnosis not present

## 2024-08-02 DIAGNOSIS — N184 Chronic kidney disease, stage 4 (severe): Secondary | ICD-10-CM | POA: Diagnosis present

## 2024-08-02 DIAGNOSIS — B9789 Other viral agents as the cause of diseases classified elsewhere: Secondary | ICD-10-CM | POA: Diagnosis present

## 2024-08-02 DIAGNOSIS — I252 Old myocardial infarction: Secondary | ICD-10-CM | POA: Diagnosis not present

## 2024-08-02 DIAGNOSIS — E872 Acidosis, unspecified: Secondary | ICD-10-CM | POA: Diagnosis present

## 2024-08-02 DIAGNOSIS — E0822 Diabetes mellitus due to underlying condition with diabetic chronic kidney disease: Secondary | ICD-10-CM | POA: Diagnosis not present

## 2024-08-02 DIAGNOSIS — B348 Other viral infections of unspecified site: Secondary | ICD-10-CM

## 2024-08-02 DIAGNOSIS — J440 Chronic obstructive pulmonary disease with acute lower respiratory infection: Secondary | ICD-10-CM | POA: Diagnosis present

## 2024-08-02 DIAGNOSIS — I130 Hypertensive heart and chronic kidney disease with heart failure and stage 1 through stage 4 chronic kidney disease, or unspecified chronic kidney disease: Secondary | ICD-10-CM | POA: Diagnosis present

## 2024-08-02 DIAGNOSIS — Z8673 Personal history of transient ischemic attack (TIA), and cerebral infarction without residual deficits: Secondary | ICD-10-CM | POA: Diagnosis not present

## 2024-08-02 DIAGNOSIS — I2510 Atherosclerotic heart disease of native coronary artery without angina pectoris: Secondary | ICD-10-CM | POA: Diagnosis present

## 2024-08-02 DIAGNOSIS — J9601 Acute respiratory failure with hypoxia: Secondary | ICD-10-CM | POA: Diagnosis present

## 2024-08-02 DIAGNOSIS — I255 Ischemic cardiomyopathy: Secondary | ICD-10-CM | POA: Diagnosis present

## 2024-08-02 DIAGNOSIS — N179 Acute kidney failure, unspecified: Secondary | ICD-10-CM | POA: Diagnosis present

## 2024-08-02 DIAGNOSIS — J189 Pneumonia, unspecified organism: Secondary | ICD-10-CM | POA: Diagnosis present

## 2024-08-02 DIAGNOSIS — I5022 Chronic systolic (congestive) heart failure: Secondary | ICD-10-CM | POA: Diagnosis present

## 2024-08-02 DIAGNOSIS — J441 Chronic obstructive pulmonary disease with (acute) exacerbation: Secondary | ICD-10-CM | POA: Diagnosis present

## 2024-08-02 DIAGNOSIS — N1832 Chronic kidney disease, stage 3b: Secondary | ICD-10-CM | POA: Diagnosis not present

## 2024-08-02 DIAGNOSIS — R6510 Systemic inflammatory response syndrome (SIRS) of non-infectious origin without acute organ dysfunction: Secondary | ICD-10-CM | POA: Diagnosis not present

## 2024-08-02 DIAGNOSIS — I2489 Other forms of acute ischemic heart disease: Secondary | ICD-10-CM | POA: Diagnosis present

## 2024-08-02 DIAGNOSIS — R072 Precordial pain: Secondary | ICD-10-CM | POA: Diagnosis present

## 2024-08-02 DIAGNOSIS — Z95810 Presence of automatic (implantable) cardiac defibrillator: Secondary | ICD-10-CM | POA: Diagnosis not present

## 2024-08-02 DIAGNOSIS — Z66 Do not resuscitate: Secondary | ICD-10-CM | POA: Diagnosis present

## 2024-08-02 DIAGNOSIS — Z7982 Long term (current) use of aspirin: Secondary | ICD-10-CM | POA: Diagnosis not present

## 2024-08-02 DIAGNOSIS — I10 Essential (primary) hypertension: Secondary | ICD-10-CM | POA: Diagnosis not present

## 2024-08-02 DIAGNOSIS — E876 Hypokalemia: Secondary | ICD-10-CM | POA: Diagnosis present

## 2024-08-02 DIAGNOSIS — K219 Gastro-esophageal reflux disease without esophagitis: Secondary | ICD-10-CM | POA: Diagnosis present

## 2024-08-02 DIAGNOSIS — D631 Anemia in chronic kidney disease: Secondary | ICD-10-CM | POA: Diagnosis present

## 2024-08-02 LAB — IRON AND TIBC
Iron: 48 ug/dL (ref 28–170)
Saturation Ratios: 17 % (ref 10.4–31.8)
TIBC: 287 ug/dL (ref 250–450)
UIBC: 240 ug/dL

## 2024-08-02 LAB — PATHOLOGIST SMEAR REVIEW

## 2024-08-02 LAB — COMPREHENSIVE METABOLIC PANEL WITH GFR
ALT: 17 U/L (ref 0–44)
AST: 21 U/L (ref 15–41)
Albumin: 3.6 g/dL (ref 3.5–5.0)
Alkaline Phosphatase: 97 U/L (ref 38–126)
Anion gap: 10 (ref 5–15)
BUN: 30 mg/dL — ABNORMAL HIGH (ref 8–23)
CO2: 18 mmol/L — ABNORMAL LOW (ref 22–32)
Calcium: 9.2 mg/dL (ref 8.9–10.3)
Chloride: 110 mmol/L (ref 98–111)
Creatinine, Ser: 1.75 mg/dL — ABNORMAL HIGH (ref 0.44–1.00)
GFR, Estimated: 30 mL/min — ABNORMAL LOW
Glucose, Bld: 105 mg/dL — ABNORMAL HIGH (ref 70–99)
Potassium: 5.5 mmol/L — ABNORMAL HIGH (ref 3.5–5.1)
Sodium: 138 mmol/L (ref 135–145)
Total Bilirubin: 0.3 mg/dL (ref 0.0–1.2)
Total Protein: 6.9 g/dL (ref 6.5–8.1)

## 2024-08-02 LAB — EXPECTORATED SPUTUM ASSESSMENT W GRAM STAIN, RFLX TO RESP C

## 2024-08-02 LAB — URINALYSIS, W/ REFLEX TO CULTURE (INFECTION SUSPECTED)
Bilirubin Urine: NEGATIVE
Glucose, UA: 50 mg/dL — AB
Hgb urine dipstick: NEGATIVE
Ketones, ur: NEGATIVE mg/dL
Leukocytes,Ua: NEGATIVE
Nitrite: NEGATIVE
Protein, ur: NEGATIVE mg/dL
Specific Gravity, Urine: 1.019 (ref 1.005–1.030)
pH: 5 (ref 5.0–8.0)

## 2024-08-02 LAB — CBC WITH DIFFERENTIAL/PLATELET
Abs Immature Granulocytes: 0.07 K/uL (ref 0.00–0.07)
Basophils Absolute: 0 K/uL (ref 0.0–0.1)
Basophils Relative: 0 %
Eosinophils Absolute: 0 K/uL (ref 0.0–0.5)
Eosinophils Relative: 0 %
HCT: 31.4 % — ABNORMAL LOW (ref 36.0–46.0)
Hemoglobin: 10.2 g/dL — ABNORMAL LOW (ref 12.0–15.0)
Immature Granulocytes: 1 %
Lymphocytes Relative: 18 %
Lymphs Abs: 2.1 K/uL (ref 0.7–4.0)
MCH: 34 pg (ref 26.0–34.0)
MCHC: 32.5 g/dL (ref 30.0–36.0)
MCV: 104.7 fL — ABNORMAL HIGH (ref 80.0–100.0)
Monocytes Absolute: 0.8 K/uL (ref 0.1–1.0)
Monocytes Relative: 7 %
Neutro Abs: 8.7 K/uL — ABNORMAL HIGH (ref 1.7–7.7)
Neutrophils Relative %: 74 %
Platelets: 238 K/uL (ref 150–400)
RBC: 3 MIL/uL — ABNORMAL LOW (ref 3.87–5.11)
RDW: 14.4 % (ref 11.5–15.5)
WBC: 11.7 K/uL — ABNORMAL HIGH (ref 4.0–10.5)
nRBC: 0.2 % (ref 0.0–0.2)

## 2024-08-02 LAB — PHOSPHORUS: Phosphorus: 2.8 mg/dL (ref 2.5–4.6)

## 2024-08-02 LAB — POTASSIUM: Potassium: 4.9 mmol/L (ref 3.5–5.1)

## 2024-08-02 LAB — STREP PNEUMONIAE URINARY ANTIGEN: Strep Pneumo Urinary Antigen: NEGATIVE

## 2024-08-02 LAB — VITAMIN B12: Vitamin B-12: 397 pg/mL (ref 180–914)

## 2024-08-02 LAB — FERRITIN: Ferritin: 279 ng/mL (ref 11–307)

## 2024-08-02 LAB — LACTIC ACID, PLASMA: Lactic Acid, Venous: 1 mmol/L (ref 0.5–1.9)

## 2024-08-02 LAB — FOLATE: Folate: 7.1 ng/mL

## 2024-08-02 LAB — MAGNESIUM: Magnesium: 3.3 mg/dL — ABNORMAL HIGH (ref 1.7–2.4)

## 2024-08-02 MED ORDER — GUAIFENESIN-DM 100-10 MG/5ML PO SYRP
5.0000 mL | ORAL_SOLUTION | ORAL | Status: DC | PRN
  Administered 2024-08-02 – 2024-08-04 (×2): 5 mL via ORAL
  Filled 2024-08-02 (×2): qty 10

## 2024-08-02 MED ORDER — AZITHROMYCIN 250 MG PO TABS
500.0000 mg | ORAL_TABLET | Freq: Every day | ORAL | Status: AC
  Administered 2024-08-02 – 2024-08-05 (×4): 500 mg via ORAL
  Filled 2024-08-02 (×5): qty 2

## 2024-08-02 MED ORDER — HYDROCODONE BIT-HOMATROP MBR 5-1.5 MG/5ML PO SOLN
5.0000 mL | Freq: Four times a day (QID) | ORAL | Status: DC | PRN
  Administered 2024-08-02 – 2024-08-05 (×7): 5 mL via ORAL
  Filled 2024-08-02 (×7): qty 5

## 2024-08-02 NOTE — Progress Notes (Signed)
 Pharmacy: Antimicrobial Stewardship Note   Stewardship team informed of national backorder of IV azithromycin  - sent message to Dr. Sebastian, will transition this patient to oral azithromycin . Noted good oral bioavailability and patient able to take po.   Thank you for allowing pharmacy to be a part of this patients care.  Almarie Lunger, PharmD, BCPS, BCIDP Infectious Diseases Clinical Pharmacist 08/02/2024 1:11 PM   **Pharmacist phone directory can now be found on [email or web address] ([email or web address]).  Listed under [facility].

## 2024-08-02 NOTE — Progress Notes (Signed)
 " PROGRESS NOTE    [name]  FMW:[id number] DOB: [birth year] DOA: 08/01/2024 PCP: Purcell Emil Schanz, MD   Chief Complaint  Patient presents with   Shortness of Breath    Brief Narrative:  Patient 79 year old female history of COPD, CAD, ischemic cardiomyopathy, CKD 3B, chronic systolic heart failure status post [facility] Jude ICD 2013, ischemic cardiomyopathy presented to the ED with a 1.5-week history of productive cough of brownish sputum, sore throat, shortness of breath, wheezing and chest tightness.  Workup concerning for pneumonia.  Respiratory viral panel positive for parainfluenza virus 3.  CT chest concerning for pneumonia.  Patient placed empirically on IV antibiotics, bronchodilators.    Assessment & Plan:   Principal Problem:   Acute respiratory failure with hypoxia (HCC) Active Problems:   CAP (community acquired pneumonia)   COPD with acute exacerbation (HCC)   Infection due to parainfluenza virus 3   UNSPECIFIED ANEMIA   TOBACCO ABUSE   Essential hypertension   GERD   IBS   History of pulmonary embolus (PE)   Chronic renal insufficiency   AICD (automatic cardioverter/defibrillator) present   Ischemic cardiomyopathy   COPD (chronic obstructive pulmonary disease) (HCC)   Dyslipidemia   Stage 3b chronic kidney disease (HCC)   HFrEF (heart failure with reduced ejection fraction) (HCC)   Constipation   Chronic pain syndrome   Hypertensive heart and kidney disease with HF and with CKD stage IV (HCC)   Mixed hyperlipidemia   Diabetes mellitus due to underlying condition with stage 4 chronic kidney disease, without long-term current use of insulin (HCC)   Hypokalemia   SIRS (systemic inflammatory response syndrome) (HCC)   Anemia  #1 acute respiratory distress with hypoxia multifactorial secondary to community-acquired pneumonia, acute COPD exacerbation, likely triggered by parainfluenza virus 3 --Patient at baseline not on home O2. - Patient noted to have  presented to PCPs office with concerns for upper respiratory infection with a 1.5-week of productive cough, shortness of breath on exertion, wheezing, sore throat. - Patient at PCPs office noted to be hypoxic, EMS was called patient placed on O2, and given DuoNebs, IV Solu-Medrol .  Patient noted to have received magnesium  in the ED. - Patient noted initially to have required a nonrebreather and subsequently transition to 2 to 3 L nasal cannula. - Chest x-ray done negative for any acute abnormalities. - CT chest without contrast with concerns for infiltrate/pneumonia.   - Lower extremity Dopplers negative.  -Sputum Gram and stain and cultures pending. -Urine strep pneumococcus negative. -Urine Legionella antigen pending. -Continue IV Rocephin  and azithromycin . - Continue Claritin , PPI, Flonase , IV Solu-Medrol , scheduled Xopenex  and Atrovent  nebs, Brovana , Pulmicort . - Supportive care.   2.  SIRS -Patient met criteria for SIRS on admission with a lactic acidosis, tachycardia, leukocytosis. - Blood cultures with no growth to date.  - Urinalysis nitrite negative leukocytes negative.  - Chest x-ray negative for any acute abnormalities. - CT chest without contrast with new patchy peripheral ground glass and reticular opacities in the posterior inferior left lower lobe compatible with infection. - Continue IV Rocephin  and azithromycin .    3.  GERD -PPI.   4.  Hypertension - Continue home regimen Lasix , Lopressor , hydralazine , Imdur .    5.  CKD stage 3b -Stable.   6.  CAD/ischemic cardiomyopathy/status post AICD/history of chronic systolic CHF - Patient euvolemic on exam.   - EKG unchanged from prior EKG.   - proBNP elevated however patient looks clinically dry on exam.   - Patient elevated troponin  of 26. - 2D echo from 05/13/2024 with a EF of 25 to 30%, wall motion abnormalities.  - Continue home regimen Lopressor , Imdur , Crestor , hydralazine , Lasix , aspirin .   7.  Hypokalemia -  Repleted.   - Potassium this morning noted at 5.5 with repeat at 4.9.  - Magnesium  noted at 3.3 - Repeat labs in the AM.      DVT prophylaxis: Heparin  Code Status: DNR Family Communication: Updated patient.  No family at bedside. Disposition: Likely home when clinically improved and on oral antibiotics.  Status is: Inpatient Remains inpatient appropriate because: Severity of illness   Consultants:  None  Procedures:  CT chest 08/01/2024 Chest x-ray 08/01/2024   Antimicrobials:  Anti-infectives (From admission, onward)    Start     Dose/Rate Route Frequency Ordered Stop   08/02/24 1800  azithromycin  (ZITHROMAX ) tablet 500 mg        500 mg Oral Daily-1800 08/02/24 1311 08/06/24 1759   08/01/24 1515  cefTRIAXone  (ROCEPHIN ) 2 g in sodium chloride  0.9 % 100 mL IVPB        2 g 200 mL/hr over 30 Minutes Intravenous Every 24 hours 08/01/24 1505 08/06/24 1459   08/01/24 1515  azithromycin  (ZITHROMAX ) 500 mg in sodium chloride  0.9 % 250 mL IVPB  Status:  Discontinued        500 mg 250 mL/hr over 60 Minutes Intravenous Every 24 hours 08/01/24 1505 08/02/24 1311         Subjective: Patient laying in bed.  Feels shortness of breath has improved.  Still with complaints of persistent cough.  Denies any chest pain or abdominal pain.  Overall feeling better than she did on admission.  Patient states at baseline not on home oxygen.  Objective: Vitals:   08/02/24 0346 08/02/24 0443 08/02/24 0945 08/02/24 1005  BP:  115/65 124/67   Pulse:  79    Resp:  20    Temp:  98 F (36.7 C)    TempSrc:  Oral    SpO2: 97% 100%  98%  Weight:      Height:        Intake/Output Summary (Last 24 hours) at 08/02/2024 1320 Last data filed at 08/02/2024 1130 Gross per 24 hour  Intake 1070 ml  Output 350 ml  Net 720 ml   Filed Weights   08/01/24 1042 08/01/24 1600  Weight: 76.2 kg 73.6 kg    Examination:  General exam: NAD. Respiratory system: Decreased scattered coarse breath sounds.  No  significant wheezing noted.  No crackles.  Speaking in full sentences.  No use of accessory muscles of respiration.  Cardiovascular system: S1 & S2 heard, RRR. No JVD, murmurs, rubs, gallops or clicks. No pedal edema. Gastrointestinal system: Abdomen is nondistended, soft and nontender. No organomegaly or masses felt. Normal bowel sounds heard. Central nervous system: Alert and oriented. No focal neurological deficits. Extremities: Symmetric 5 x 5 power. Skin: No rashes, lesions or ulcers Psychiatry: Judgement and insight appear normal. Mood & affect appropriate.     Data Reviewed: I have personally reviewed following labs and imaging studies  CBC: Recent Labs  Lab 08/01/24 1115 08/02/24 0418  WBC 17.4* 11.7*  NEUTROABS 7.5 8.7*  HGB 12.0 10.2*  HCT 36.3 31.4*  MCV 103.1* 104.7*  PLT 277 238    Basic Metabolic Panel: Recent Labs  Lab 08/01/24 1115 08/02/24 0418 08/02/24 0825  NA 141 138  --   K 3.0* 5.5* 4.9  CL 105 110  --   CO2  21* 18*  --   GLUCOSE 158* 105*  --   BUN 26* 30*  --   CREATININE 1.88* 1.75*  --   CALCIUM  9.6 9.2  --   MG  --  3.3*  --   PHOS  --  2.8  --     GFR: Estimated Creatinine Clearance: 25.7 mL/min (A) (by C-G formula based on SCr of 1.75 mg/dL (H)).  Liver Function Tests: Recent Labs  Lab 08/01/24 1115 08/02/24 0418  AST 29 21  ALT 23 17  ALKPHOS 151* 97  BILITOT 0.4 0.3  PROT 7.9 6.9  ALBUMIN  4.1 3.6    CBG: No results for input(s): GLUCAP in the last 168 hours.   Recent Results (from the past 240 hours)  Respiratory (~20 pathogens) panel by PCR     Status: Abnormal   Collection Time: 08/01/24  4:36 PM   Specimen: Nasopharyngeal Swab; Respiratory  Result Value Ref Range Status   Adenovirus NOT DETECTED NOT DETECTED Final   Coronavirus 229E NOT DETECTED NOT DETECTED Final    Comment: (NOTE) The Coronavirus on the Respiratory Panel, DOES NOT test for the novel  Coronavirus (2019 nCoV)    Coronavirus HKU1 NOT DETECTED  NOT DETECTED Final   Coronavirus NL63 NOT DETECTED NOT DETECTED Final   Coronavirus OC43 NOT DETECTED NOT DETECTED Final   Metapneumovirus NOT DETECTED NOT DETECTED Final   Rhinovirus / Enterovirus NOT DETECTED NOT DETECTED Final   Influenza A NOT DETECTED NOT DETECTED Final   Influenza B NOT DETECTED NOT DETECTED Final   Parainfluenza Virus 1 NOT DETECTED NOT DETECTED Final   Parainfluenza Virus 2 NOT DETECTED NOT DETECTED Final   Parainfluenza Virus 3 DETECTED (A) NOT DETECTED Final   Parainfluenza Virus 4 NOT DETECTED NOT DETECTED Final   Respiratory Syncytial Virus NOT DETECTED NOT DETECTED Final   Bordetella pertussis NOT DETECTED NOT DETECTED Final   Bordetella Parapertussis NOT DETECTED NOT DETECTED Final   Chlamydophila pneumoniae NOT DETECTED NOT DETECTED Final   Mycoplasma pneumoniae NOT DETECTED NOT DETECTED Final    Comment: Performed at [facility], [address], [place], [place] [address]  Culture, blood (routine x 2) Call MD if unable to obtain prior to antibiotics being given     Status: None (Preliminary result)   Collection Time: 08/01/24  5:25 PM   Specimen: BLOOD RIGHT ARM  Result Value Ref Range Status   Specimen Description   Final    BLOOD RIGHT ARM Performed at [facility], [address] [place], [place] [address]    Special Requests   Final    BOTTLES DRAWN AEROBIC ONLY Blood Culture adequate volume Performed at [facility], [address] [place], [place] [address]    Culture   Final    NO GROWTH < 12 HOURS Performed at [facility], [address], [place], [place] [address]    Report Status PENDING  Incomplete  Culture, blood (routine x 2) Call MD if unable to obtain prior to antibiotics being given     Status: None (Preliminary result)   Collection Time: 08/01/24  5:30 PM   Specimen: BLOOD RIGHT HAND  Result Value Ref Range Status   Specimen Description   Final    BLOOD RIGHT HAND Performed at [facility], [address] [place], [place] [address]    Special Requests   Final    BOTTLES DRAWN AEROBIC ONLY Blood Culture adequate volume Performed  at [facility], [address] [place], [place] [address]    Culture   Final    NO GROWTH < 12 HOURS Performed at [facility], [address], [place], [place] [address]    Report Status PENDING  Incomplete  Expectorated Sputum Assessment w Gram Stain, Rflx to Resp Cult     Status: None   Collection Time: 08/02/24  5:48 AM   Specimen: Sputum  Result Value Ref Range Status   Specimen Description SPUTUM  Final   Special Requests NONE  Final   Sputum evaluation   Final    THIS SPECIMEN IS ACCEPTABLE FOR SPUTUM CULTURE Performed at [facility], [address] [place], [place] [address]    Report Status 08/02/2024 FINAL  Final         Radiology Studies: VAS US  LOWER EXTREMITY VENOUS (DVT) (ONLY [facility] & [facility]) Result Date: 08/02/2024  Lower Venous DVT Study Patient Name:  KAIRI HARSHBARGER  Date of Exam:   08/02/2024 Medical Rec #: [id number]         Accession #:    [id number] Date of Birth: 24-Jun-1946         Patient Gender: F Patient Age:   71 years Exam Location:  [facility] Procedure:      VAS US  LOWER EXTREMITY VENOUS (DVT) Referring Phys: LONNI SAKAI --------------------------------------------------------------------------------  Indications: Acute respiratory failure.  Limitations: Poor ultrasound/tissue interface and pain intolerance. Comparison Study: No previous exams Performing Technologist: Jody Hill RVT, RDMS  Examination Guidelines: A complete evaluation includes B-mode imaging, spectral Doppler, color Doppler, and power Doppler as needed of all accessible portions of each vessel. Bilateral testing is considered an integral part of a complete examination. Limited examinations for reoccurring indications may be performed as noted. The reflux portion of the exam is performed  with the patient in reverse Trendelenburg.  +--------+---------------+---------+-----------+----------+--------------------+ RIGHT   CompressibilityPhasicitySpontaneityPropertiesThrombus Aging       +--------+---------------+---------+-----------+----------+--------------------+ CFV     Full           Yes      Yes                                       +--------+---------------+---------+-----------+----------+--------------------+ SFJ     Full                                                              +--------+---------------+---------+-----------+----------+--------------------+ FV Prox Full           Yes      Yes                                       +--------+---------------+---------+-----------+----------+--------------------+ FV Mid  Full           Yes      Yes                                       +--------+---------------+---------+-----------+----------+--------------------+ FV  Yes      Yes                  Patent by            Distal                                               color/doppler        +--------+---------------+---------+-----------+----------+--------------------+ PFV     Full                                                              +--------+---------------+---------+-----------+----------+--------------------+ POP     Full           Yes      Yes                                       +--------+---------------+---------+-----------+----------+--------------------+ PTV     Full                                                              +--------+---------------+---------+-----------+----------+--------------------+ PERO    Full                                                              +--------+---------------+---------+-----------+----------+--------------------+ unable to tolerate compression to distal FV   +--------+---------------+---------+-----------+----------+--------------------+ LEFT    CompressibilityPhasicitySpontaneityPropertiesThrombus Aging       +--------+---------------+---------+-----------+----------+--------------------+ CFV     Full           Yes      Yes                                       +--------+---------------+---------+-----------+----------+--------------------+ SFJ     Full                                                              +--------+---------------+---------+-----------+----------+--------------------+ FV Prox Full           Yes      Yes                                       +--------+---------------+---------+-----------+----------+--------------------+ FV Mid  Full           Yes      Yes                                       +--------+---------------+---------+-----------+----------+--------------------+  FV                     Yes      Yes                  patent by            Distal                                               color/doppler        +--------+---------------+---------+-----------+----------+--------------------+ PFV     Full                                                              +--------+---------------+---------+-----------+----------+--------------------+ POP     Full           Yes      Yes                                       +--------+---------------+---------+-----------+----------+--------------------+ PTV     Full                                                              +--------+---------------+---------+-----------+----------+--------------------+ PERO    Full                                                              +--------+---------------+---------+-----------+----------+--------------------+ unable to tolerate compression to distal FV    Summary: BILATERAL: - No evidence of deep vein thrombosis seen in the lower extremities, bilaterally. -No evidence of  popliteal cyst, bilaterally.   *See table(s) above for measurements and observations.    Preliminary    CT Chest Wo Contrast Result Date: 08/01/2024 EXAM: CT CHEST WITHOUT CONTRAST 08/01/2024 02:24:44 PM TECHNIQUE: CT of the chest was performed without the administration of intravenous contrast. Multiplanar reformatted images are provided for review. Automated exposure control, iterative reconstruction, and/or weight based adjustment of the mA/kV was utilized to reduce the radiation dose to as low as reasonably achievable. COMPARISON: CT chest abdomen and pelvis 05/12/2024. CLINICAL HISTORY: Chest wall pain, nontraumatic, infection or inflammation suspected, xray done; Productive cough with hypoxia and shortness of breath. X-ray does not show pneumonia but clinically very high concern for pneumonia. Cannot get PE study due to kidney function. Hospitalist requested noncontrasted CT. Cough. FINDINGS: MEDIASTINUM: Pacemaker is present. Coronary and aortic atherosclerotic calcifications are noted. Thoracic aortic arch graft/stent is present. The heart is mildly enlarged. The central airways are clear. LYMPH NODES: No mediastinal, hilar or axillary lymphadenopathy. LUNGS AND PLEURA: There is minimal atelectasis in the posterior third of the left lung bases. There are patchy peripheral ground glass opacities and reticular  opacities in the posterior inferior left lower lobe, new from prior. No pleural effusion or pneumothorax. SOFT TISSUES/BONES: The bones are diffusely osteopenic. Mild T9 compression deformity is unchanged. Degenerative changes affect the spine. No acute abnormality of the soft tissues. UPPER ABDOMEN: There is a small hiatal hernia. Subcentimeter hypodensity in the dome of the bladder is too small to characterize, likely a cyst. Limited images of the upper abdomen demonstrates no other acute abnormality. IMPRESSION: 1. New patchy peripheral ground-glass and reticular opacities in the posterior inferior  left lower lobe, compatible with infection. Electronically signed by: Greig Pique MD 08/01/2024 03:34 PM EST RP Workstation: [id number]   DG Chest Portable 1 View Result Date: 08/01/2024 CLINICAL DATA:  Shortness of breath.  Cough. EXAM: PORTABLE CHEST 1 VIEW COMPARISON:  05/19/2024. FINDINGS: The heart size and mediastinal contours are unchanged. Stable single lead AICD. Endoluminal stent graft within the aortic arch. No overt pulmonary edema, focal consolidation, pleural effusion, or pneumothorax. No acute osseous abnormality. IMPRESSION: No acute cardiopulmonary findings. Electronically Signed   By: Harrietta Sherry M.D.   On: 08/01/2024 12:12        Scheduled Meds:  amitriptyline   50 mg Oral QHS   arformoterol   15 mcg Nebulization BID   aspirin  EC  81 mg Oral Daily   azithromycin   500 mg Oral q1800   budesonide  (PULMICORT ) nebulizer solution  0.5 mg Nebulization BID   fluticasone   2 spray Each Nare Daily   heparin   5,000 Units Subcutaneous Q8H   hydrALAZINE   10 mg Oral Q8H   ipratropium  0.5 mg Nebulization Q6H   isosorbide  mononitrate  30 mg Oral Daily   levalbuterol   0.63 mg Nebulization Q6H   loratadine   10 mg Oral Daily   metoprolol  tartrate  50 mg Oral BID   pantoprazole   40 mg Oral Q0600   rosuvastatin   20 mg Oral q1800   sodium chloride  flush  3 mL Intravenous Q12H   topiramate   50 mg Oral Daily   Continuous Infusions:  cefTRIAXone  (ROCEPHIN )  IV 2 g (08/01/24 1800)     LOS: 0 days    Time spent: 40 minutes    Toribio Hummer, MD [facility]   To contact the attending provider between 7A-7P or the covering provider during after hours 7P-7A, please log into the web site [email or web address] and access using universal [place] password for that web site. If you do not have the password, please call the hospital operator.  08/02/2024, 1:20 PM    "

## 2024-08-02 NOTE — Evaluation (Signed)
 Occupational Therapy Evaluation Patient Details Name: [name] MRN: [id number] DOB: August 31, 1945 Today's Date: 08/02/2024   History of Present Illness   79 yr old female admitted with acute respiratory failure, PNA, COPD exacerbation, SIRS. PMH: CM, CHF, COPD, DDD, AICD, STEMI, PE, thoracic AA, memory loss,  hearing loss, depression, HF     Clinical Impressions The pt is currently presenting slightly below her baseline level of functioning for self care management. She is limited by the below listed deficits (see OT problem list). During the session, she required SBA for tasks, including doffing and donning socks seated EOB, sit to stand, ambulating to and from the bathroom in her room without an assistive device and toileting at bathroom level. She reported feelings of significant chest tightness/pressure, compromised endurance, and shortness of breath with activity. She will benefit from OT services to maximize her independence with ADLs and to decrease the risk for further deconditioning. Home health OT vs. no post-acute therapy needs anticipated.      If plan is discharge home, recommend the following:   Assistance with cooking/housework;Assist for transportation     Functional Status Assessment   Patient has had a recent decline in their functional status and demonstrates the ability to make significant improvements in function in a reasonable and predictable amount of time.     Equipment Recommendations   Tub/shower seat     Recommendations for Other Services         Precautions/Restrictions   Restrictions Weight Bearing Restrictions Per Provider Order: No     Mobility Bed Mobility Overal bed mobility: Needs Assistance Bed Mobility: Supine to Sit, Sit to Supine     Supine to sit: Supervision Sit to supine: Supervision        Transfers Overall transfer level: Needs assistance Equipment used: None Transfers: Sit to/from Stand Sit to Stand:  Supervision                  Balance Overall balance assessment: Mild deficits observed, not formally tested         ADL either performed or assessed with clinical judgement   ADL Overall ADL's : Needs assistance/impaired Eating/Feeding: Independent;Sitting   Grooming: Set up;Supervision/safety;Standing   Upper Body Bathing: Set up;Sitting   Lower Body Bathing: Supervison/ safety;Set up;Sit to/from stand   Upper Body Dressing : Set up;Sitting   Lower Body Dressing: Set up;Supervision/safety;Sit to/from stand   Toilet Transfer: Supervision/safety;Set up;Ambulation;Grab bars   Toileting- Clothing Manipulation and Hygiene: Supervision/safety;Set up;Sit to/from stand Toileting - Clothing Manipulation Details (indicate cue type and reason): at bathroom level             Vision   Additional Comments: She correctly read the time depicted on the wall clock.            Pertinent Vitals/Pain Pain Assessment Pain Assessment: No/denies pain     Extremity/Trunk Assessment Upper Extremity Assessment Upper Extremity Assessment: Overall WFL for tasks assessed   Lower Extremity Assessment Lower Extremity Assessment: Overall WFL for tasks assessed     Communication Communication Communication: No apparent difficulties Factors Affecting Communication: Reduced clarity of speech   Cognition Arousal: Alert Behavior During Therapy: WFL for tasks assessed/performed               OT - Cognition Comments: Oriented x4                 Following commands: Intact       Cueing  General Comments   Cueing Techniques:  Verbal cues              Home Living Family/patient expects to be discharged to:: Private residence Living Arrangements: Alone   Type of Home: Apartment Home Access: Stairs to enter Entrance Stairs-Number of Steps: 1   Home Layout: One level     Bathroom Shower/Tub: Tub/shower unit         Home Equipment: Agricultural Consultant (2  wheels)          Prior Functioning/Environment Prior Level of Function : Independent/Modified Independent;Driving             Mobility Comments:  (Ambulates without a device, though unable to ambulate longer distances due to compromised endurance.) ADLs Comments:  (Modified independent to independent with ADLs. She does limited driving.)    OT Problem List: Decreased activity tolerance;Decreased strength;Cardiopulmonary status limiting activity;Decreased knowledge of use of DME or AE   OT Treatment/Interventions: Self-care/ADL training;Therapeutic exercise;Energy conservation;DME and/or AE instruction;Therapeutic activities;Patient/family education      OT Goals(Current goals can be found in the care plan section)   Acute Rehab OT Goals Patient Stated Goal: improved breathing and chest tightness OT Goal Formulation: With patient Time For Goal Achievement: 08/16/24 Potential to Achieve Goals: Good ADL Goals Pt Will Perform Grooming: with modified independence;standing Pt Will Perform Lower Body Dressing: with modified independence;sit to/from stand Pt Will Transfer to Toilet: with modified independence;ambulating Pt Will Perform Toileting - Clothing Manipulation and hygiene: with modified independence;sit to/from stand   OT Frequency:  Min 2X/week       AM-PAC OT 6 Clicks Daily Activity     Outcome Measure Help from another person eating meals?: None Help from another person taking care of personal grooming?: A Little Help from another person toileting, which includes using toliet, bedpan, or urinal?: A Little Help from another person bathing (including washing, rinsing, drying)?: A Little Help from another person to put on and taking off regular upper body clothing?: A Little Help from another person to put on and taking off regular lower body clothing?: A Little 6 Click Score: 19   End of Session Equipment Utilized During Treatment: Oxygen Nurse Communication:  Mobility status  Activity Tolerance: Other (comment) (Fair tolerance; limited by shortness of breath and compromised endurance) Patient left: in bed;with call bell/phone within reach;with nursing/sitter in room  OT Visit Diagnosis: Muscle weakness (generalized) (M62.81)                Time: 8475-8459 OT Time Calculation (min): 16 min Charges:  OT General Charges $OT Visit: 1 Visit OT Evaluation $OT Eval Low Complexity: 1 Low    Delanna JINNY Lesches, OTR/L 08/02/2024, 5:27 PM

## 2024-08-02 NOTE — Progress Notes (Signed)
 BLE venous duplex has been completed.   Results can be found under chart review under CV PROC. 08/02/2024 10:45 AM Miri Jose RVT, RDMS

## 2024-08-02 NOTE — Evaluation (Signed)
 Physical Therapy Evaluation Patient Details Name: [name] MRN: [id number] DOB: February 17, 1946 Today's Date: 08/02/2024  History of Present Illness  79 yo female admitted with acute respiratory failure, Pna, COPD exac, SIRS. Hx of CM, CHF, COPD, DDD, AICD, STEMI, PE, thoracic AA, memory loss,  hearing loss, depression, HF  Clinical Impression  On eval, pt required CGA for mobility. She ambulated ~20 feet x 2 around the room on today. Ambulation distance limited by dyspnea and pt reported chest tightness with exertion. O2 was 92% on RA-placed nasal cannula back on patient and assisted her back to bed. Pt reports she plans to return to her apt where she lives alone. Will recommend HHPT f/u. Will continue to follow pt during this hospital stay.        If plan is discharge home, recommend the following: A little help with walking and/or transfers;A little help with bathing/dressing/bathroom;Assistance with cooking/housework;Assist for transportation;Help with stairs or ramp for entrance   Can travel by private vehicle        Equipment Recommendations None recommended by PT  Recommendations for Other Services       Functional Status Assessment Patient has had a recent decline in their functional status and demonstrates the ability to make significant improvements in function in a reasonable and predictable amount of time.     Precautions / Restrictions Precautions Precautions: Fall Restrictions Weight Bearing Restrictions Per Provider Order: No      Mobility  Bed Mobility Overal bed mobility: Needs Assistance Bed Mobility: Supine to Sit, Sit to Supine     Supine to sit: Supervision, HOB elevated Sit to supine: Supervision, HOB elevated   General bed mobility comments: Supv    Transfers Overall transfer level: Needs assistance Equipment used: Rolling walker (2 wheels) Transfers: Sit to/from Stand Sit to Stand: Supervision           General transfer comment: Supv for  safety    Ambulation/Gait Ambulation/Gait assistance: Contact guard assist Gait Distance (Feet): 20 Feet (x2) Assistive device: None Gait Pattern/deviations: Step-through pattern, Decreased stride length       General Gait Details: Mild unsteadiness with pt intermittently furniture cruising. O2 92% on RA but pt with dyspnea, increased work of breathing and report of chest tightness with exertion. Placed back on [place] O2.  Stairs            Wheelchair Mobility     Tilt Bed    Modified Rankin (Stroke Patients Only)       Balance Overall balance assessment: Mild deficits observed, not formally tested                                           Pertinent Vitals/Pain      Home Living Family/patient expects to be discharged to:: Private residence Living Arrangements: Alone Available Help at Discharge: Family;Available PRN/intermittently Type of Home: Apartment Home Access: Stairs to enter   Entrance Stairs-Number of Steps: 1   Home Layout: One level Home Equipment: Agricultural Consultant (2 wheels);BSC/3in1;Grab bars - tub/shower      Prior Function Prior Level of Function : Independent/Modified Independent;Driving             Mobility Comments: mod I/I usually without AD, but limited distance due to dyspnea. ADLs Comments: mod I for ADL's, more difficulty with iADL's ie halling groceries, cleaning.     Extremity/Trunk Assessment   Upper  Extremity Assessment Upper Extremity Assessment: Defer to OT evaluation    Lower Extremity Assessment Lower Extremity Assessment: Generalized weakness    Cervical / Trunk Assessment Cervical / Trunk Assessment: Normal  Communication   Communication Communication: No apparent difficulties Factors Affecting Communication: Reduced clarity of speech    Cognition Arousal: Alert Behavior During Therapy: WFL for tasks assessed/performed   PT - Cognitive impairments: No apparent impairments                          Following commands: Intact       Cueing Cueing Techniques: Verbal cues     General Comments      Exercises     Assessment/Plan    PT Assessment Patient needs continued PT services  PT Problem List Decreased strength;Decreased range of motion;Decreased activity tolerance;Decreased balance;Decreased mobility;Decreased knowledge of use of DME       PT Treatment Interventions DME instruction;Gait training;Functional mobility training;Therapeutic activities;Therapeutic exercise;Patient/family education;Balance training    PT Goals (Current goals can be found in the Care Plan section)  Acute Rehab PT Goals Patient Stated Goal: get/feel better; return home PT Goal Formulation: With patient Time For Goal Achievement: 08/16/24 Potential to Achieve Goals: Good    Frequency Min 3X/week     Co-evaluation               AM-PAC PT 6 Clicks Mobility  Outcome Measure Help needed turning from your back to your side while in a flat bed without using bedrails?: None Help needed moving from lying on your back to sitting on the side of a flat bed without using bedrails?: None Help needed moving to and from a bed to a chair (including a wheelchair)?: A Little Help needed standing up from a chair using your arms (e.g., wheelchair or bedside chair)?: A Little Help needed to walk in hospital room?: A Little Help needed climbing 3-5 steps with a railing? : A Little 6 Click Score: 20    End of Session   Activity Tolerance: Patient limited by fatigue (limited by dyspnea) Patient left: in bed;with call bell/phone within reach   PT Visit Diagnosis: Difficulty in walking, not elsewhere classified (R26.2);Unsteadiness on feet (R26.81)    Time: 8766-8754 PT Time Calculation (min) (ACUTE ONLY): 12 min   Charges:   PT Evaluation $PT Eval Low Complexity: 1 Low   PT General Charges $$ ACUTE PT VISIT: 1 Visit            Dannial SQUIBB, PT Acute Rehabilitation   Office: [phone]

## 2024-08-02 NOTE — Progress Notes (Signed)
 PT Cancellation Note  Patient Details Name: [name] MRN: [id number] DOB: 03/17/1946   Cancelled Treatment:    Reason Eval/Treat Not Completed: Patient at procedure or test/unavailable    Dannial SQUIBB, PT Acute Rehabilitation  Office: [phone]

## 2024-08-03 DIAGNOSIS — J9601 Acute respiratory failure with hypoxia: Secondary | ICD-10-CM | POA: Diagnosis not present

## 2024-08-03 LAB — CBC
HCT: 29.7 % — ABNORMAL LOW (ref 36.0–46.0)
Hemoglobin: 9.7 g/dL — ABNORMAL LOW (ref 12.0–15.0)
MCH: 34.4 pg — ABNORMAL HIGH (ref 26.0–34.0)
MCHC: 32.7 g/dL (ref 30.0–36.0)
MCV: 105.3 fL — ABNORMAL HIGH (ref 80.0–100.0)
Platelets: 238 K/uL (ref 150–400)
RBC: 2.82 MIL/uL — ABNORMAL LOW (ref 3.87–5.11)
RDW: 14.7 % (ref 11.5–15.5)
WBC: 11.2 K/uL — ABNORMAL HIGH (ref 4.0–10.5)
nRBC: 0.2 % (ref 0.0–0.2)

## 2024-08-03 LAB — RENAL FUNCTION PANEL
Albumin: 3.4 g/dL — ABNORMAL LOW (ref 3.5–5.0)
Anion gap: 9 (ref 5–15)
BUN: 30 mg/dL — ABNORMAL HIGH (ref 8–23)
CO2: 20 mmol/L — ABNORMAL LOW (ref 22–32)
Calcium: 8.8 mg/dL — ABNORMAL LOW (ref 8.9–10.3)
Chloride: 110 mmol/L (ref 98–111)
Creatinine, Ser: 1.72 mg/dL — ABNORMAL HIGH (ref 0.44–1.00)
GFR, Estimated: 30 mL/min — ABNORMAL LOW
Glucose, Bld: 114 mg/dL — ABNORMAL HIGH (ref 70–99)
Phosphorus: 3.8 mg/dL (ref 2.5–4.6)
Potassium: 4.3 mmol/L (ref 3.5–5.1)
Sodium: 138 mmol/L (ref 135–145)

## 2024-08-03 LAB — URINE CULTURE: Culture: NO GROWTH

## 2024-08-03 LAB — LEGIONELLA PNEUMOPHILA SEROGP 1 UR AG: L. pneumophila Serogp 1 Ur Ag: NEGATIVE

## 2024-08-03 LAB — MAGNESIUM: Magnesium: 2.9 mg/dL — ABNORMAL HIGH (ref 1.7–2.4)

## 2024-08-03 MED ORDER — METHOCARBAMOL 500 MG PO TABS
500.0000 mg | ORAL_TABLET | Freq: Four times a day (QID) | ORAL | Status: DC | PRN
  Administered 2024-08-03 (×2): 500 mg via ORAL
  Filled 2024-08-03 (×2): qty 1

## 2024-08-03 MED ORDER — LEVALBUTEROL HCL 0.63 MG/3ML IN NEBU
0.6300 mg | INHALATION_SOLUTION | Freq: Three times a day (TID) | RESPIRATORY_TRACT | Status: DC
  Administered 2024-08-03 – 2024-08-04 (×4): 0.63 mg via RESPIRATORY_TRACT
  Filled 2024-08-03 (×4): qty 3

## 2024-08-03 MED ORDER — IPRATROPIUM BROMIDE 0.02 % IN SOLN
0.5000 mg | Freq: Three times a day (TID) | RESPIRATORY_TRACT | Status: DC
  Administered 2024-08-03 – 2024-08-04 (×4): 0.5 mg via RESPIRATORY_TRACT
  Filled 2024-08-03 (×4): qty 2.5

## 2024-08-03 NOTE — Progress Notes (Signed)
 Physical Therapy Treatment Patient Details Name: [name] MRN: [id number] DOB: 02/13/1946 Today's Date: 08/03/2024   History of Present Illness 79 yo female admitted with acute respiratory failure, Pna, COPD exac, SIRS. Hx of CM, CHF, COPD, DDD, AICD, STEMI, PE, thoracic AA, memory loss,  hearing loss, depression, HF    PT Comments  Pt agreeable to working with therapy. She remains weak and exhibits poor activity tolerance. She ambulated ~15 feet then another 40 feet during session. Significant dyspnea with exertion. Increased dyspnea and increased instability as ambulation distance progressed. During session: BP 118/74, HR 89 bpm, O2 95% on 3L. There is some concern with how well pt will manage at home. Discussed d/c plan-pt reports she plans to d/c home and feels she will manage fine. Will continue to follow and progress activity as tolerated. Recommend HHPT and a rollator for ambulation safety.     If plan is discharge home, recommend the following: A little help with walking and/or transfers;A little help with bathing/dressing/bathroom;Assistance with cooking/housework;Assist for transportation;Help with stairs or ramp for entrance   Can travel by private vehicle        Equipment Recommendations  Rollator (4 wheels)    Recommendations for Other Services       Precautions / Restrictions Precautions Precautions: Fall Restrictions Weight Bearing Restrictions Per Provider Order: No     Mobility  Bed Mobility Overal bed mobility: Needs Assistance Bed Mobility: Supine to Sit, Sit to Supine     Supine to sit: Supervision, HOB elevated Sit to supine: Supervision, HOB elevated        Transfers Overall transfer level: Needs assistance Equipment used: None, Rolling walker (2 wheels) Transfers: Sit to/from Stand Sit to Stand: Contact guard assist           General transfer comment: CGA for safety.    Ambulation/Gait Ambulation/Gait assistance: Contact guard  assist, Min assist Gait Distance (Feet): 40 Feet (15'x1; 40'x1) Assistive device: None, Rolling walker (2 wheels) (hallway handrail)         General Gait Details: Increased assistance needed as distance progressed. Furniture cruising vs hallway handrail needed. Also walked with RW to bathroom-pt reluctant to use but seems to provide increased stability and some energy conservation. O2 94% on 3L, significant dyspnea with any distance over 25 feet. Pt was quite unsteady and exhibiting risk for falls towards end of ambulation distance.    Stairs             Wheelchair Mobility     Tilt Bed    Modified Rankin (Stroke Patients Only)       Balance Overall balance assessment: Needs assistance         Standing balance support: During functional activity Standing balance-Leahy Scale: Fair                              Hotel Manager: No apparent difficulties Factors Affecting Communication: Reduced clarity of speech  Cognition Arousal: Alert Behavior During Therapy: WFL for tasks assessed/performed   PT - Cognitive impairments: No apparent impairments                         Following commands: Intact      Cueing Cueing Techniques: Verbal cues  Exercises      General Comments        Pertinent Vitals/Pain Pain Assessment Pain Assessment: No/denies pain    Home Living  Prior Function            PT Goals (current goals can now be found in the care plan section) Progress towards PT goals: Progressing toward goals    Frequency    Min 3X/week      PT Plan      Co-evaluation              AM-PAC PT 6 Clicks Mobility   Outcome Measure  Help needed turning from your back to your side while in a flat bed without using bedrails?: None Help needed moving from lying on your back to sitting on the side of a flat bed without using bedrails?: None Help needed  moving to and from a bed to a chair (including a wheelchair)?: A Little Help needed standing up from a chair using your arms (e.g., wheelchair or bedside chair)?: A Little Help needed to walk in hospital room?: A Little Help needed climbing 3-5 steps with a railing? : A Lot 6 Click Score: 19    End of Session Equipment Utilized During Treatment: Oxygen Activity Tolerance: Patient limited by fatigue (limited by dyspnea) Patient left: in bed;with call bell/phone within reach;with bed alarm set   PT Visit Diagnosis: Difficulty in walking, not elsewhere classified (R26.2);Unsteadiness on feet (R26.81)     Time: 8361-8347 PT Time Calculation (min) (ACUTE ONLY): 14 min  Charges:    $Gait Training: 8-22 mins PT General Charges $$ ACUTE PT VISIT: 1 Visit                         Dannial SQUIBB, PT Acute Rehabilitation  Office: [phone]

## 2024-08-03 NOTE — Plan of Care (Signed)

## 2024-08-03 NOTE — Progress Notes (Signed)
 Mobility Specialist - Progress Note  ([place] 3L) Pre-mobility: 85 bpm HR, 97% SpO2 During mobility: 101 bpm HR, 92% SpO2 Post-mobility: 87 bpm HR, 98% SPO2   08/03/24 0943  Mobility  Activity Ambulated with assistance  Level of Assistance Contact guard assist, steadying assist  Assistive Device None  Distance Ambulated (ft) 50 ft  Range of Motion/Exercises Active  Activity Response Tolerated fair  Mobility visit 1 Mobility  Mobility Specialist Start Time (ACUTE ONLY) 0930  Mobility Specialist Stop Time (ACUTE ONLY) 0943  Mobility Specialist Time Calculation (min) (ACUTE ONLY) 13 min   Pt was found in bed and agreeable to mobilize. C/o back pain. Harder work of breathing with progression. At EOS was left on recliner chair with all needs met. Call bell in reach. RN notified.   Erminio Leos,  Mobility Specialist Can be reached via Secure Chat

## 2024-08-03 NOTE — Hospital Course (Addendum)
 [name] is a 79 y.o. female with PMH of COPD, CAD, ischemic cardiomyopathy, CKD 3B, chronic systolic heart failure status post [facility], [place] Jude ICD 2013, ischemic cardiomyopathy presented to the ED with a 1.5-week history of productive cough of brownish sputum, sore throat, shortness of breath, wheezing and chest tightness and in ED workup concerning for pneumonia. Respiratory viral panel positive for parainfluenza virus 3. CT chest > New patchy peripheral ground-glass and reticular opacities in the posterior inferior left lower lobe, compatible with infection  and was concerning for pneumonia.Patient admitted for acute respiratory distress hypoxia in the setting of pneumonia, parainfluenza infection Initial lactic acidosis 2.1-2.7 subsequently resolved to 1, troponin elevated but flat 26-35-suspecting demand ischemia.  BNP elevated chronically in 1600> now at 1400. Lower extremity Dopplers negative. Urine strep pneumococcus negative. Patient treated with antibiotics bronchodilators.  Blood culture no growth, respiratory culture with rare normal respiratory flora. Overall patient is clinically improved and is being discharged home She is eager to go home today.  Son updated on the phone and agreeable  Subjective: Seen and examined Walked in hallway and no longer needing oxygen Still some cough and  can't get rid off the fluid Overnight remains afebrile, on room air now, vital remained stable  Discharge Diagnoses:   Acute respiratory distress with acute hypoxic respiratory failure Community-acquired pneumonia Acute COPD exacerbation due to parainfluenza virus 3 Acute viral illness/SIRS: Presenting with 1-1/2-week of productive cough sore throat shortness of breath wheezing chest tightness, and hypoxic Treating for hypoxic failure in the setting of parainfluenza virus 3, pneumonia, COPD exacerbation. Blood culture NGTD, sputum culture normal respiratory flora, urine strep/Legionella negative   CT chest without contrast with concerns for infiltrate/pneumonia.   Lower extremity Dopplers negative. Treated with  IV ceftriaxone  x 5 days, azithromycin , Claritin  Flonase ,Xopenex  and Atrovent  nebs, Brovana , Pulmicort , oral steroid Check for home oxygen setting and plan for dc home and not needing o2 now. Will do 2 more days of Duricef for discharge.  CAD-ischemic cardiomyopathy  s/p AICD Chronic systolic CHF Elevated troponin due to demand ischemia: Remains euvolemic, EKG nonischemic with flat troponin. proBNP chronically elevated stable.  TTE 10/25-EF of 25-30%, wall motion abnormalities.  Continue Lopressor , Imdur , Crestor , hydralazine , Lasix , aspirin .  GERD: On PPI.   Hypertension: Controlled , Cont  home metoprolol , hydralazine , Imdur .    AKI on CKD stage 3b: Stable at 1.7 -1.8 range.  Creatinine peaked to 2.2, monitor intermittently by pcp   Hypokalemia: Resolved  Anemia of chronic disease: Hb stable at 9 to 10 g  Constipation: Continue bowel regimen  Mobility: PT Orders: Active PT Follow up Rec: Home Health Pt1/02/2025 1023   DVT prophylaxis: heparin  injection 5,000 Units Start: 08/01/24 1515 Code Status:   Code Status: Limited: Do not attempt resuscitation (DNR) -DNR-LIMITED -Do Not Intubate/DNI  Family Communication: plan of care discussed with patient at bedside. Patient status is: Remains hospitalized because of severity of illness Level of care: Progressive   Dispo: The patient is from: home            Anticipated disposition: Home w/ HH  Objective: Vitals last 24 hrs: Vitals:   08/04/24 2127 08/05/24 0452 08/05/24 0855 08/05/24 0857  BP: 130/80 123/65    Pulse: 98 77    Resp: 19 19    Temp: 98.9 F (37.2 C) 98.3 F (36.8 C)    TempSrc: Oral Oral    SpO2: 95% 98% 95% 95%  Weight:      Height:  Physical Examination: General exam: AAOX3 HEENT:Oral mucosa moist, Ear/Nose WNL grossly Respiratory system: Bilaterally diminished   Cardiovascular system: S1 & S2 +,No JVD. Gastrointestinal system: Abdomen soft,NT,ND, BS+ Nervous System: Alert, awake, moving all extremities,and following commands. Extremities: extremities warm, leg edema neg Skin: Warm, no rashes MSK: Normal muscle bulk,tone, power   Medications reviewed:  Scheduled Meds:  amitriptyline   50 mg Oral QHS   arformoterol   15 mcg Nebulization BID   aspirin  EC  81 mg Oral Daily   budesonide  (PULMICORT ) nebulizer solution  0.5 mg Nebulization BID   fluticasone   2 spray Each Nare Daily   heparin   5,000 Units Subcutaneous Q8H   hydrALAZINE   10 mg Oral Q8H   isosorbide  mononitrate  30 mg Oral Daily   loratadine   10 mg Oral Daily   metoprolol  tartrate  50 mg Oral BID   pantoprazole   40 mg Oral Q0600   predniSONE   40 mg Oral Q breakfast   rosuvastatin   20 mg Oral q1800   sodium chloride  flush  3 mL Intravenous Q12H   topiramate   50 mg Oral Daily   Continuous Infusions:  cefTRIAXone  (ROCEPHIN )  IV 2 g (08/04/24 1525)   Diet: Diet Order             Diet Heart Room service appropriate? Yes; Fluid consistency: Thin  Diet effective now                 Hemoglobin 9.1 g

## 2024-08-03 NOTE — Progress Notes (Signed)
 " PROGRESS NOTE [name]  FMW:[id number] DOB: Aug 07, 1945 DOA: 08/01/2024 PCP: Purcell Emil Schanz, MD  Brief Narrative/Hospital Course: [name] is a 79 y.o. female with PMH of COPD, CAD, ischemic cardiomyopathy, CKD 3B, chronic systolic heart failure status post [facility] Jude ICD 2013, ischemic cardiomyopathy presented to the ED with a 1.5-week history of productive cough of brownish sputum, sore throat, shortness of breath, wheezing and chest tightness and in ED workup concerning for pneumonia. Respiratory viral panel positive for parainfluenza virus 3. CT chest concerning for pneumonia.  Patient admitted for acute respiratory distress hypoxia in the setting of pneumonia, parainfluenza infection Initial lactic acidosis 2.1-2.7 subsequently resolved to 1, troponin elevated but flat 26-35-suspecting demand ischemia.  BNP elevated chronically in 1600> now at 1400.   Subjective: Seen and examined today Resting comfortably still coughing, on nasal cannula not on oxygen prior to admission.  Overall feeling better Overnight remains on oral cannula 3 L afebrile, VSS, Labs stable creatinine 1.7, mild anemia leukocytosis stable  Assessment and plan:  Acute respiratory distress with acute hypoxic respiratory failure Community-acquired pneumonia Acute COPD exacerbation due to parainfluenza virus 3 Acute viral illness/SIRS: Presenting with 1-1/2-week of productive cough sore throat shortness of breath wheezing chest tightness, and hypoxic, also hypoxic at PCP office Treating for hypoxic failure in the setting of parainfluenza virus 3, pneumonia, COPD exacerbation. Blood culture NGTD, sputum culture pending, urine strep antigen negative Legionella pending. CT chest without contrast with concerns for infiltrate/pneumonia.   Lower extremity Dopplers negative. Urine strep pneumococcus negative. Continue IV ceftriaxone , azithromycin , Claritin  Flonase , Solu-Medrol  -scheduled Xopenex  and Atrovent   nebs, Brovana , Pulmicort . Continue supplemental oxygen and wean as tolerated.  Check for home oxygen need   CAD-ischemic cardiomyopathy  s/p AICD history of chronic systolic CHF Elevated troponin due to demand ischemia: Patient euvolemic on exam, nonischemic EKG and troponin flat.  proBNP chronically elevated stable 2D echo from 05/13/2024 with a EF of 25 to 30%, wall motion abnormalities.  Continue home regimen Lopressor , Imdur , Crestor , hydralazine , Lasix , aspirin .  GERD: On PPI.   Hypertension: Controlled on home metoprolol , hydralazine , Imdur .    CKD stage 3b: Stable at 1.7 range.  Monitor intermittently Recent Labs    08/31/23 1351 05/12/24 0843 05/13/24 0255 05/19/24 1450 08/01/24 1115 08/02/24 0418 08/02/24 0825 08/03/24 0422  BUN 38* 20 25* 30* 26* 30*  --  30*  CREATININE 2.02* 2.26* 2.15* 1.85* 1.88* 1.75*  --  1.72*  CO2 22 18* 19* 23 21* 18*  --  20*  K 4.0 3.6 3.7 4.2 3.0* 5.5* 4.9 4.3    Hypokalemia: Resolved  Anemia of chronic disease: Hb stable at 9 to 10 g  Constipation: Continue bowel regimen  Mobility: PT Orders: Active PT Follow up Rec: Home Health Pt1/12/2024 1358   DVT prophylaxis: heparin  injection 5,000 Units Start: 08/01/24 1515 Code Status:   Code Status: Limited: Do not attempt resuscitation (DNR) -DNR-LIMITED -Do Not Intubate/DNI  Family Communication: plan of care discussed with patient at bedside. Patient status is: Remains hospitalized because of severity of illness Level of care: Progressive   Dispo: The patient is from: home            Anticipated disposition: h/h hh in 24 hrs  Objective: Vitals last 24 hrs: Vitals:   08/03/24 0027 08/03/24 0443 08/03/24 0537 08/03/24 0811  BP:  113/66 115/69   Pulse:  75 80   Resp: 20 16 18    Temp:  98 F (36.7 C)    TempSrc:  SpO2:  99% 99% 98%  Weight:      Height:       Physical Examination: General exam: AAOX3 HEENT:Oral mucosa moist, Ear/Nose WNL grossly Respiratory  system: Bilaterally clear  W/ diminished breath sounds. Cardiovascular system: S1 & S2 +,No JVD. Gastrointestinal system: Abdomen soft,NT,ND, BS+ Nervous System: Alert, awake, moving all extremities,and following commands. Extremities: extremities warm, leg edema neg Skin: Warm, no rashes MSK: Normal muscle bulk,tone, power   Medications reviewed:  Scheduled Meds:  amitriptyline   50 mg Oral QHS   arformoterol   15 mcg Nebulization BID   aspirin  EC  81 mg Oral Daily   azithromycin   500 mg Oral q1800   budesonide  (PULMICORT ) nebulizer solution  0.5 mg Nebulization BID   fluticasone   2 spray Each Nare Daily   heparin   5,000 Units Subcutaneous Q8H   hydrALAZINE   10 mg Oral Q8H   ipratropium  0.5 mg Nebulization TID   isosorbide  mononitrate  30 mg Oral Daily   levalbuterol   0.63 mg Nebulization TID   loratadine   10 mg Oral Daily   metoprolol  tartrate  50 mg Oral BID   pantoprazole   40 mg Oral Q0600   rosuvastatin   20 mg Oral q1800   sodium chloride  flush  3 mL Intravenous Q12H   topiramate   50 mg Oral Daily   Continuous Infusions:  cefTRIAXone  (ROCEPHIN )  IV 200 mL/hr at 08/02/24 1618   Diet: Diet Order             Diet Heart Room service appropriate? Yes; Fluid consistency: Thin  Diet effective now                  Data Reviewed: I have personally reviewed following labs and imaging studies ( see epic result tab) CBC: Recent Labs  Lab 08/01/24 1115 08/02/24 0418 08/03/24 0422  WBC 17.4* 11.7* 11.2*  NEUTROABS 7.5 8.7*  --   HGB 12.0 10.2* 9.7*  HCT 36.3 31.4* 29.7*  MCV 103.1* 104.7* 105.3*  PLT 277 238 238   CMP: Recent Labs  Lab 08/01/24 1115 08/02/24 0418 08/02/24 0825 08/03/24 0422  NA 141 138  --  138  K 3.0* 5.5* 4.9 4.3  CL 105 110  --  110  CO2 21* 18*  --  20*  GLUCOSE 158* 105*  --  114*  BUN 26* 30*  --  30*  CREATININE 1.88* 1.75*  --  1.72*  CALCIUM  9.6 9.2  --  8.8*  MG  --  3.3*  --  2.9*  PHOS  --  2.8  --  3.8   GFR: Estimated  Creatinine Clearance: 26.1 mL/min (A) (by C-G formula based on SCr of 1.72 mg/dL (H)). Recent Labs  Lab 08/01/24 1115 08/02/24 0418 08/03/24 0422  AST 29 21  --   ALT 23 17  --   ALKPHOS 151* 97  --   BILITOT 0.4 0.3  --   PROT 7.9 6.9  --   ALBUMIN  4.1 3.6 3.4*    Recent Labs  Lab 08/01/24 1115  LIPASE 53*   No results for input(s): AMMONIA in the last 168 hours. Coagulation Profile: No results for input(s): INR, PROTIME in the last 168 hours. Unresulted Labs (From admission, onward)     Start     Ordered   08/04/24 0500  Basic metabolic panel with GFR  Tomorrow morning,   R       Question:  Specimen collection method  Answer:  Lab=Lab collect  08/03/24 0935   08/01/24 1443  Legionella Pneumophila Serogp 1 Ur Ag  (COPD / Pneumonia / Cellulitis / Lower Extremity Wound (Diabetic Foot Infection))  Once,   R        08/01/24 1505           Antimicrobials/Microbiology: Anti-infectives (From admission, onward)    Start     Dose/Rate Route Frequency Ordered Stop   08/02/24 1800  azithromycin  (ZITHROMAX ) tablet 500 mg        500 mg Oral Daily-1800 08/02/24 1311 08/06/24 1759   08/01/24 1515  cefTRIAXone  (ROCEPHIN ) 2 g in sodium chloride  0.9 % 100 mL IVPB        2 g 200 mL/hr over 30 Minutes Intravenous Every 24 hours 08/01/24 1505 08/06/24 1459   08/01/24 1515  azithromycin  (ZITHROMAX ) 500 mg in sodium chloride  0.9 % 250 mL IVPB  Status:  Discontinued        500 mg 250 mL/hr over 60 Minutes Intravenous Every 24 hours 08/01/24 1505 08/02/24 1311         Component Value Date/Time   SDES SPUTUM 08/02/2024 0548   SDES  08/02/2024 0548    SPUTUM Performed at [facility], [address] [place], [place] [address]    SPECREQUEST NONE 08/02/2024 0548   SPECREQUEST  08/02/2024 0548    NONE Reflexed from [id number] Performed at [facility], [address], [place], [place] [address]    CULT  08/02/2024 [phone]    CULTURE REINCUBATED  FOR BETTER GROWTH Performed at [facility], [address] [place], [place] [address]    REPTSTATUS 08/02/2024 FINAL 08/02/2024 0548   REPTSTATUS PENDING 08/02/2024 9451  Procedures:  Mennie LAMY, MD [facility] 08/03/2024, 1:33 PM   "

## 2024-08-04 ENCOUNTER — Ambulatory Visit: Payer: 59

## 2024-08-04 DIAGNOSIS — I5020 Unspecified systolic (congestive) heart failure: Secondary | ICD-10-CM

## 2024-08-04 DIAGNOSIS — J9601 Acute respiratory failure with hypoxia: Secondary | ICD-10-CM | POA: Diagnosis not present

## 2024-08-04 LAB — BASIC METABOLIC PANEL WITH GFR
Anion gap: 9 (ref 5–15)
BUN: 30 mg/dL — ABNORMAL HIGH (ref 8–23)
CO2: 23 mmol/L (ref 22–32)
Calcium: 8.9 mg/dL (ref 8.9–10.3)
Chloride: 107 mmol/L (ref 98–111)
Creatinine, Ser: 1.83 mg/dL — ABNORMAL HIGH (ref 0.44–1.00)
GFR, Estimated: 28 mL/min — ABNORMAL LOW
Glucose, Bld: 113 mg/dL — ABNORMAL HIGH (ref 70–99)
Potassium: 4.7 mmol/L (ref 3.5–5.1)
Sodium: 138 mmol/L (ref 135–145)

## 2024-08-04 LAB — CULTURE, RESPIRATORY W GRAM STAIN: Culture: NORMAL

## 2024-08-04 MED ORDER — PREDNISONE 20 MG PO TABS
40.0000 mg | ORAL_TABLET | Freq: Every day | ORAL | Status: DC
  Administered 2024-08-04 – 2024-08-05 (×2): 40 mg via ORAL
  Filled 2024-08-04 (×2): qty 2

## 2024-08-04 NOTE — Progress Notes (Signed)
 " PROGRESS NOTE [name]  FMW:[id number] DOB: 11-Jul-1946 DOA: 08/01/2024 PCP: Purcell Emil Schanz, MD  Brief Narrative/Hospital Course: [name] is a 79 y.o. female with PMH of COPD, CAD, ischemic cardiomyopathy, CKD 3B, chronic systolic heart failure status post [facility] Jude ICD 2013, ischemic cardiomyopathy presented to the ED with a 1.5-week history of productive cough of brownish sputum, sore throat, shortness of breath, wheezing and chest tightness and in ED workup concerning for pneumonia. Respiratory viral panel positive for parainfluenza virus 3. CT chest concerning for pneumonia.  Patient admitted for acute respiratory distress hypoxia in the setting of pneumonia, parainfluenza infection Initial lactic acidosis 2.1-2.7 subsequently resolved to 1, troponin elevated but flat 26-35-suspecting demand ischemia.  BNP elevated chronically in 1600> now at 1400. Lower extremity Dopplers negative. Urine strep pneumococcus negative. Patient treated with antibiotics bronchodilators  Subjective: Seen and examined Complains of ongoing cough with Overnight remains afebrile on 2 L of cannula> now on room air.Labs with stable electrolytes creatinine at 1.8  Assessment and plan:  Acute respiratory distress with acute hypoxic respiratory failure Community-acquired pneumonia Acute COPD exacerbation due to parainfluenza virus 3 Acute viral illness/SIRS: Presenting with 1-1/2-week of productive cough sore throat shortness of breath wheezing chest tightness, and hypoxic Treating for hypoxic failure in the setting of parainfluenza virus 3, pneumonia, COPD exacerbation. Blood culture NGTD, sputum culture pending stain with rare gram-positive rods, urine strep/Legionella negative  CT chest without contrast with concerns for infiltrate/pneumonia.   Lower extremity Dopplers negative. Continue IV ceftriaxone , azithromycin , Claritin  Flonase ,Xopenex  and Atrovent  nebs, Brovana , Pulmicort .  With ongoing  coughing add steroid Continue supplemental oxygen and wean as tolerated.  Check for home oxygen need   CAD-ischemic cardiomyopathy  s/p AICD Chronic systolic CHF Elevated troponin due to demand ischemia: Remains euvolemic, EKG nonischemic with flat troponin. proBNP chronically elevated stable. TTE 10/25-EF of 25-30%, wall motion abnormalities.  Continue Lopressor , Imdur , Crestor , hydralazine , Lasix , aspirin .  GERD: On PPI.   Hypertension: Controlled , Cont  home metoprolol , hydralazine , Imdur .    CKD stage 3b: Stable at 1.7 range. Monitor intermittently Recent Labs    08/31/23 1351 05/12/24 0843 05/13/24 0255 05/19/24 1450 08/01/24 1115 08/02/24 0418 08/02/24 0825 08/03/24 0422 08/04/24 0443  BUN 38* 20 25* 30* 26* 30*  --  30* 30*  CREATININE 2.02* 2.26* 2.15* 1.85* 1.88* 1.75*  --  1.72* 1.83*  CO2 22 18* 19* 23 21* 18*  --  20* 23  K 4.0 3.6 3.7 4.2 3.0* 5.5* 4.9 4.3 4.7    Hypokalemia: Resolved  Anemia of chronic disease: Hb stable at 9 to 10 g  Constipation: Continue bowel regimen  Mobility: PT Orders: Active PT Follow up Rec: Home Health Pt1/02/2025 1023   DVT prophylaxis: heparin  injection 5,000 Units Start: 08/01/24 1515 Code Status:   Code Status: Limited: Do not attempt resuscitation (DNR) -DNR-LIMITED -Do Not Intubate/DNI  Family Communication: plan of care discussed with patient at bedside. Patient status is: Remains hospitalized because of severity of illness Level of care: Progressive   Dispo: The patient is from: home            Anticipated disposition: h/h hh in 24 hrs if stable  Objective: Vitals last 24 hrs: Vitals:   08/04/24 0132 08/04/24 0608 08/04/24 0826 08/04/24 0831  BP: (!) 104/58 105/68    Pulse: 75 73    Resp: 18 18    Temp:  98.1 F (36.7 C)    TempSrc:  Oral    SpO2: 98%  98% 98% (S) 98%  Weight:      Height:       Physical Examination: General exam: AAOX3 HEENT:Oral mucosa moist, Ear/Nose WNL grossly Respiratory  system: Bilaterally diminished breath sounds Cardiovascular system: S1 & S2 +,No JVD. Gastrointestinal system: Abdomen soft,NT,ND, BS+ Nervous System: Alert, awake, moving all extremities,and following commands. Extremities: extremities warm, leg edema neg Skin: Warm, no rashes MSK: Normal muscle bulk,tone, power   Medications reviewed:  Scheduled Meds:  amitriptyline   50 mg Oral QHS   arformoterol   15 mcg Nebulization BID   aspirin  EC  81 mg Oral Daily   azithromycin   500 mg Oral q1800   budesonide  (PULMICORT ) nebulizer solution  0.5 mg Nebulization BID   fluticasone   2 spray Each Nare Daily   heparin   5,000 Units Subcutaneous Q8H   hydrALAZINE   10 mg Oral Q8H   isosorbide  mononitrate  30 mg Oral Daily   loratadine   10 mg Oral Daily   metoprolol  tartrate  50 mg Oral BID   pantoprazole   40 mg Oral Q0600   rosuvastatin   20 mg Oral q1800   sodium chloride  flush  3 mL Intravenous Q12H   topiramate   50 mg Oral Daily   Continuous Infusions:  cefTRIAXone  (ROCEPHIN )  IV 2 g (08/03/24 1444)   Diet: Diet Order             Diet Heart Room service appropriate? Yes; Fluid consistency: Thin  Diet effective now                  Data Reviewed: I have personally reviewed following labs and imaging studies ( see epic result tab) CBC: Recent Labs  Lab 08/01/24 1115 08/02/24 0418 08/03/24 0422  WBC 17.4* 11.7* 11.2*  NEUTROABS 7.5 8.7*  --   HGB 12.0 10.2* 9.7*  HCT 36.3 31.4* 29.7*  MCV 103.1* 104.7* 105.3*  PLT 277 238 238   CMP: Recent Labs  Lab 08/01/24 1115 08/02/24 0418 08/02/24 0825 08/03/24 0422 08/04/24 0443  NA 141 138  --  138 138  K 3.0* 5.5* 4.9 4.3 4.7  CL 105 110  --  110 107  CO2 21* 18*  --  20* 23  GLUCOSE 158* 105*  --  114* 113*  BUN 26* 30*  --  30* 30*  CREATININE 1.88* 1.75*  --  1.72* 1.83*  CALCIUM  9.6 9.2  --  8.8* 8.9  MG  --  3.3*  --  2.9*  --   PHOS  --  2.8  --  3.8  --    GFR: Estimated Creatinine Clearance: 24.6 mL/min (A) (by C-G  formula based on SCr of 1.83 mg/dL (H)). Recent Labs  Lab 08/01/24 1115 08/02/24 0418 08/03/24 0422  AST 29 21  --   ALT 23 17  --   ALKPHOS 151* 97  --   BILITOT 0.4 0.3  --   PROT 7.9 6.9  --   ALBUMIN  4.1 3.6 3.4*    Recent Labs  Lab 08/01/24 1115  LIPASE 53*   No results for input(s): AMMONIA in the last 168 hours. Coagulation Profile: No results for input(s): INR, PROTIME in the last 168 hours. Unresulted Labs (From admission, onward)    None      Antimicrobials/Microbiology: Anti-infectives (From admission, onward)    Start     Dose/Rate Route Frequency Ordered Stop   08/02/24 1800  azithromycin  (ZITHROMAX ) tablet 500 mg        500 mg Oral Daily-1800 08/02/24  1311 08/06/24 1759   08/01/24 1515  cefTRIAXone  (ROCEPHIN ) 2 g in sodium chloride  0.9 % 100 mL IVPB        2 g 200 mL/hr over 30 Minutes Intravenous Every 24 hours 08/01/24 1505 08/06/24 1459   08/01/24 1515  azithromycin  (ZITHROMAX ) 500 mg in sodium chloride  0.9 % 250 mL IVPB  Status:  Discontinued        500 mg 250 mL/hr over 60 Minutes Intravenous Every 24 hours 08/01/24 1505 08/02/24 1311         Component Value Date/Time   SDES SPUTUM 08/02/2024 0548   SDES  08/02/2024 0548    SPUTUM Performed at [facility], [address], [place], [place] [address]    SPECREQUEST NONE 08/02/2024 0548   SPECREQUEST  08/02/2024 0548    NONE Reflexed from [id number] Performed at [facility], [address] [place], [place] [address]    CULT  08/02/[id number]    CULTURE REINCUBATED FOR BETTER GROWTH Performed at [facility], [address], [place], [place] [address]    REPTSTATUS 08/02/2024 FINAL 08/02/2024 0548   REPTSTATUS PENDING 08/02/2024 9451  Procedures:  Mennie LAMY, MD [facility] 08/04/2024, 10:42 AM   "

## 2024-08-04 NOTE — Plan of Care (Signed)
" °  Problem: Health Behavior/Discharge Planning: Goal: Ability to manage health-related needs will improve Outcome: Progressing   Problem: Clinical Measurements: Goal: Ability to maintain clinical measurements within normal limits will improve Outcome: Progressing Goal: Will remain free from infection Outcome: Progressing Goal: Diagnostic test results will improve Outcome: Progressing Goal: Respiratory complications will improve Outcome: Progressing Goal: Cardiovascular complication will be avoided Outcome: Progressing   Problem: Activity: Goal: Risk for activity intolerance will decrease Outcome: Progressing   Problem: Nutrition: Goal: Adequate nutrition will be maintained Outcome: Progressing   Problem: Coping: Goal: Level of anxiety will decrease Outcome: Progressing   Problem: Elimination: Goal: Will not experience complications related to bowel motility Outcome: Progressing Goal: Will not experience complications related to urinary retention Outcome: Progressing   Problem: Pain Managment: Goal: General experience of comfort will improve and/or be controlled Outcome: Progressing   Problem: Safety: Goal: Ability to remain free from injury will improve Outcome: Progressing   Problem: Skin Integrity: Goal: Risk for impaired skin integrity will decrease Outcome: Progressing   Problem: Activity: Goal: Ability to tolerate increased activity will improve Outcome: Progressing   Problem: Clinical Measurements: Goal: Ability to maintain a body temperature in the normal range will improve Outcome: Progressing   Problem: Respiratory: Goal: Ability to maintain adequate ventilation will improve Outcome: Progressing Goal: Ability to maintain a clear airway will improve Outcome: Progressing   Problem: Education: Goal: Knowledge of disease or condition will improve Outcome: Progressing Goal: Knowledge of the prescribed therapeutic regimen will improve Outcome:  Progressing Goal: Individualized Educational Video(s) Outcome: Progressing   Problem: Activity: Goal: Ability to tolerate increased activity will improve Outcome: Progressing Goal: Will verbalize the importance of balancing activity with adequate rest periods Outcome: Progressing   Problem: Respiratory: Goal: Ability to maintain a clear airway will improve Outcome: Progressing Goal: Levels of oxygenation will improve Outcome: Progressing Goal: Ability to maintain adequate ventilation will improve Outcome: Progressing   "

## 2024-08-04 NOTE — Progress Notes (Signed)
 Physical Therapy Treatment Patient Details Name: [name] MRN: [id number] DOB: [birth year] Today's Date: 08/04/2024   History of Present Illness 79 yo female admitted with acute respiratory failure, Pna, COPD exac, SIRS. Hx of CM, CHF, COPD, DDD, AICD, STEMI, PE, thoracic AA, memory loss,  hearing loss, depression, HF    PT Comments  PT - Cognition Comments: AxO x 3 pleasant and willing Lady who lives home alone in an [place].  Was IND/Driving some. Amb with NO AD but admits to furnitire walking.  Has a walker but doesn't use it.  Stated she has been sick since Thanksgiving.  I can't seem to get rid of it(cough), stated Pt.  Pt self able to rise OOB and transfer to near by [facility].  Assisted with amb while monitoring stas.  General Gait Details: Able to amb without walker as Pt prefers but did use furniture approx 50% to steady self.  Amb on RA sats avg 96% and HR increased from 76 to 98.  Couging increased.  Pt still c/o MAX CONJESTION (nose and sinus).  Actvity tolerance remains limited with c/o feeling tired, wore out and increased coughing episodes is the most tiring, stated Pt.  Dyspnea 2/4.  Did require an extended rest break after.  Pt plans to return home when medically cleared.  LPT has rec HH PT and a Rollator for community use.      SATURATION QUALIFICATIONS: (This note is used to comply with regulatory documentation for home oxygen)  Patient Saturations on Room Air at Rest = 92%  Patient Saturations on Room Air while Ambulating 43 feet= 96%  Please briefly explain why patient needs home oxygen:  Pt did NOT require supplemental oxygen during amb    If plan is discharge home, recommend the following: A little help with walking and/or transfers;A little help with bathing/dressing/bathroom;Assistance with cooking/housework;Assist for transportation;Help with stairs or ramp for entrance   Can travel by private vehicle        Equipment Recommendations  Rollator (4  wheels)    Recommendations for Other Services       Precautions / Restrictions Precautions Precautions: Fall Restrictions Weight Bearing Restrictions Per Provider Order: No     Mobility  Bed Mobility Overal bed mobility: Modified Independent             General bed mobility comments: self able to get OOB and back into bed with increased time.    Transfers Overall transfer level: Needs assistance Equipment used: None Transfers: Sit to/from Stand, Bed to chair/wheelchair/BSC Sit to Stand: Modified independent (Device/Increase time) Stand pivot transfers: Modified independent (Device/Increase time)         General transfer comment: self able to transfer using B UE's for support/steady self.  Prefers NOT to use any AD as prior.  Good safety cagnition.    Ambulation/Gait Ambulation/Gait assistance: Supervision Gait Distance (Feet): 43 Feet Assistive device: None Gait Pattern/deviations: Step-through pattern, Decreased stride length Gait velocity: decreased     General Gait Details: Able to amb without walker as Pt prefers but did use furniture approx 50% to steady self.  Amb on RA sats avg 96% and HR increased from 76 to 98.  Couging increased.  Pt still c/o MAX CONJESTION (nose and sinus).  Actvity tolerance remains limited with c/o feeling tired, wore out and increased coughing episodes is the most tiring, stated Pt.  Dyspnea 2/4.  Did require an extended rest break after.   Stairs  Wheelchair Mobility     Tilt Bed    Modified Rankin (Stroke Patients Only)       Balance                                            Communication    Cognition   Behavior During Therapy: WFL for tasks assessed/performed   PT - Cognitive impairments: No apparent impairments                       PT - Cognition Comments: AxO x 3 pleasant and willing Lady who lives home alone in an [place].  Was IND/Driving some. Amb with  NO AD but admits to furnitire walking.  Has a walker but doesn't use it.  Stated she has been sick since Thanksgiving.  I can't seem to get rid of it(cough), stated Pt. Following commands: Intact      Cueing    Exercises      General Comments        Pertinent Vitals/Pain Pain Assessment Pain Assessment: No/denies pain    Home Living                          Prior Function            PT Goals (current goals can now be found in the care plan section) Progress towards PT goals: Progressing toward goals    Frequency    Min 3X/week      PT Plan      Co-evaluation              AM-PAC PT 6 Clicks Mobility   Outcome Measure  Help needed turning from your back to your side while in a flat bed without using bedrails?: None Help needed moving from lying on your back to sitting on the side of a flat bed without using bedrails?: None Help needed moving to and from a bed to a chair (including a wheelchair)?: None Help needed standing up from a chair using your arms (e.g., wheelchair or bedside chair)?: None Help needed to walk in hospital room?: A Little Help needed climbing 3-5 steps with a railing? : A Little 6 Click Score: 22    End of Session Equipment Utilized During Treatment: Gait belt Activity Tolerance: Patient limited by fatigue Patient left: in bed;with call bell/phone within reach;with bed alarm set   PT Visit Diagnosis: Difficulty in walking, not elsewhere classified (R26.2);Unsteadiness on feet (R26.81)     Time: 9050-8986 PT Time Calculation (min) (ACUTE ONLY): 24 min  Charges:    $Gait Training: 8-22 mins $Therapeutic Activity: 8-22 mins PT General Charges $$ ACUTE PT VISIT: 1 Visit                     Katheryn Leap  PTA Acute  Rehabilitation Services Office M-F          [phone]

## 2024-08-05 ENCOUNTER — Other Ambulatory Visit (HOSPITAL_COMMUNITY): Payer: Self-pay

## 2024-08-05 DIAGNOSIS — J9601 Acute respiratory failure with hypoxia: Secondary | ICD-10-CM | POA: Diagnosis not present

## 2024-08-05 MED ORDER — PREDNISONE 20 MG PO TABS
40.0000 mg | ORAL_TABLET | Freq: Every day | ORAL | 0 refills | Status: AC
  Filled 2024-08-05: qty 10, 5d supply, fill #0

## 2024-08-05 MED ORDER — METHOCARBAMOL 500 MG PO TABS
500.0000 mg | ORAL_TABLET | Freq: Four times a day (QID) | ORAL | 0 refills | Status: AC | PRN
  Filled 2024-08-05: qty 30, 8d supply, fill #0

## 2024-08-05 MED ORDER — CEFADROXIL 500 MG PO CAPS
500.0000 mg | ORAL_CAPSULE | Freq: Two times a day (BID) | ORAL | 0 refills | Status: AC
  Filled 2024-08-05: qty 4, 2d supply, fill #0

## 2024-08-05 MED ORDER — ROBAFEN DM 20-200 MG/20ML PO LIQD
5.0000 mL | ORAL | 0 refills | Status: AC | PRN
  Filled 2024-08-05: qty 118, 7d supply, fill #0

## 2024-08-05 MED ORDER — LORATADINE 10 MG PO TABS
10.0000 mg | ORAL_TABLET | Freq: Every day | ORAL | 0 refills | Status: AC
  Filled 2024-08-05: qty 7, 7d supply, fill #0

## 2024-08-05 NOTE — Discharge Summary (Signed)
 Physician Discharge Summary  [name] FMW:[id number] DOB: Jul 21, 1946 DOA: 08/01/2024  PCP: Purcell Emil Schanz, MD  Admit date: 08/01/2024 Discharge date: 08/05/2024 Recommendations for Outpatient Follow-up:  Follow up with PCP in 1 weeks-call for appointment Please obtain BMP/CBC in one week  Discharge Dispo: home w/ hh Discharge Condition: Stable Code Status:   Code Status: Limited: Do not attempt resuscitation (DNR) -DNR-LIMITED -Do Not Intubate/DNI  Diet recommendation:  Diet Order             Diet Heart Room service appropriate? Yes; Fluid consistency: Thin  Diet effective now                    Brief/Interim Summary: [name] is a 79 y.o. female with PMH of COPD, CAD, ischemic cardiomyopathy, CKD 3B, chronic systolic heart failure status post [facility] Jude ICD 2013, ischemic cardiomyopathy presented to the ED with a 1.5-week history of productive cough of brownish sputum, sore throat, shortness of breath, wheezing and chest tightness and in ED workup concerning for pneumonia. Respiratory viral panel positive for parainfluenza virus 3. CT chest > New patchy peripheral ground-glass and reticular opacities in the posterior inferior left lower lobe, compatible with infection  and was concerning for pneumonia.Patient admitted for acute respiratory distress hypoxia in the setting of pneumonia, parainfluenza infection Initial lactic acidosis 2.1-2.7 subsequently resolved to 1, troponin elevated but flat 26-35-suspecting demand ischemia.  BNP elevated chronically in 1600> now at 1400. Lower extremity Dopplers negative. Urine strep pneumococcus negative. Patient treated with antibiotics bronchodilators.  Blood culture no growth, respiratory culture with rare normal respiratory flora. Overall patient is clinically improved and is being discharged home She is eager to go home today.  Son updated on the phone and agreeable  Subjective: Seen and examined Walked in hallway and no  longer needing oxygen Still some cough and  can't get rid off the fluid Overnight remains afebrile, on room air now, vital remained stable  Discharge Diagnoses:   Acute respiratory distress with acute hypoxic respiratory failure Community-acquired pneumonia Acute COPD exacerbation due to parainfluenza virus 3 Acute viral illness/SIRS: Presenting with 1-1/2-week of productive cough sore throat shortness of breath wheezing chest tightness, and hypoxic Treating for hypoxic failure in the setting of parainfluenza virus 3, pneumonia, COPD exacerbation. Blood culture NGTD, sputum culture normal respiratory flora, urine strep/Legionella negative  CT chest without contrast with concerns for infiltrate/pneumonia.   Lower extremity Dopplers negative. Treated with  IV ceftriaxone  x 5 days, azithromycin , Claritin  Flonase ,Xopenex  and Atrovent  nebs, Brovana , Pulmicort , oral steroid Check for home oxygen setting and plan for dc home and not needing o2 now. Will do 2 more days of Duricef for discharge.  CAD-ischemic cardiomyopathy  s/p AICD Chronic systolic CHF Elevated troponin due to demand ischemia: Remains euvolemic, EKG nonischemic with flat troponin. proBNP chronically elevated stable.  TTE 10/25-EF of 25-30%, wall motion abnormalities.  Continue Lopressor , Imdur , Crestor , hydralazine , Lasix , aspirin .  GERD: On PPI.   Hypertension: Controlled , Cont  home metoprolol , hydralazine , Imdur .    AKI on CKD stage 3b: Stable at 1.7 -1.8 range.  Creatinine peaked to 2.2, monitor intermittently by pcp   Hypokalemia: Resolved  Anemia of chronic disease: Hb stable at 9 to 10 g  Constipation: Continue bowel regimen  Mobility: PT Orders: Active PT Follow up Rec: Home Health Pt1/02/2025 1023   DVT prophylaxis: heparin  injection 5,000 Units Start: 08/01/24 1515 Code Status:   Code Status: Limited: Do not attempt resuscitation (DNR) -DNR-LIMITED -Do Not  Intubate/DNI  Family Communication: plan  of care discussed with patient at bedside. Patient status is: Remains hospitalized because of severity of illness Level of care: Progressive   Dispo: The patient is from: home            Anticipated disposition: Home w/ HH  Objective: Vitals last 24 hrs: Vitals:   08/04/24 2127 08/05/24 0452 08/05/24 0855 08/05/24 0857  BP: 130/80 123/65    Pulse: 98 77    Resp: 19 19    Temp: 98.9 F (37.2 C) 98.3 F (36.8 C)    TempSrc: Oral Oral    SpO2: 95% 98% 95% 95%  Weight:      Height:       Physical Examination: General exam: AAOX3 HEENT:Oral mucosa moist, Ear/Nose WNL grossly Respiratory system: Bilaterally diminished  Cardiovascular system: S1 & S2 +,No JVD. Gastrointestinal system: Abdomen soft,NT,ND, BS+ Nervous System: Alert, awake, moving all extremities,and following commands. Extremities: extremities warm, leg edema neg Skin: Warm, no rashes MSK: Normal muscle bulk,tone, power   Medications reviewed:  Scheduled Meds:  amitriptyline   50 mg Oral QHS   arformoterol   15 mcg Nebulization BID   aspirin  EC  81 mg Oral Daily   budesonide  (PULMICORT ) nebulizer solution  0.5 mg Nebulization BID   fluticasone   2 spray Each Nare Daily   heparin   5,000 Units Subcutaneous Q8H   hydrALAZINE   10 mg Oral Q8H   isosorbide  mononitrate  30 mg Oral Daily   loratadine   10 mg Oral Daily   metoprolol  tartrate  50 mg Oral BID   pantoprazole   40 mg Oral Q0600   predniSONE   40 mg Oral Q breakfast   rosuvastatin   20 mg Oral q1800   sodium chloride  flush  3 mL Intravenous Q12H   topiramate   50 mg Oral Daily   Continuous Infusions:  cefTRIAXone  (ROCEPHIN )  IV 2 g (08/04/24 1525)   Diet: Diet Order             Diet Heart Room service appropriate? Yes; Fluid consistency: Thin  Diet effective now                 Hemoglobin 9.1 g     Consultation: See note.  Discharge Instructions  Discharge Instructions     Discharge instructions   Complete by: As directed    Please call  call MD or return to ER for similar or worsening recurring problem that brought you to hospital or if any fever,nausea/vomiting,abdominal pain, uncontrolled pain, chest pain,  shortness of breath or any other alarming symptoms.  Please follow-up your doctor as instructed in a week time and call the office for appointment.  Please avoid alcohol, smoking, or any other illicit substance and maintain healthy habits including taking your regular medications as prescribed.  You were cared for by a hospitalist during your hospital stay. If you have any questions about your discharge medications or the care you received while you were in the hospital after you are discharged, you can call the unit and ask to speak with the hospitalist on call if the hospitalist that took care of you is not available.  Once you are discharged, your primary care physician will handle any further medical issues. Please note that NO REFILLS for any discharge medications will be authorized once you are discharged, as it is imperative that you return to your primary care physician (or establish a relationship with a primary care physician if you do not have one) for your aftercare  needs so that they can reassess your need for medications and monitor your lab values   Face-to-face encounter (required for Medicare/Medicaid patients)   Complete by: As directed    I Mennie LAMY certify that this patient is under my care and that I, or a nurse practitioner or physician's assistant working with me, had a face-to-face encounter that meets the physician face-to-face encounter requirements with this patient on 08/05/2024. The encounter with the patient was in whole, or in part for the following medical condition(s) which is the primary reason for home health care (List medical condition): Pneumonia, COPD exacerbation   The encounter with the patient was in whole, or in part, for the following medical condition, which is the primary reason for home  health care: Pneumonia, COPD exacerbation   I certify that, based on my findings, the following services are medically necessary home health services: Physical therapy   Reason for Medically Necessary Home Health Services: Therapy- Therapeutic Exercises to Increase Strength and Endurance   My clinical findings support the need for the above services: Shortness of breath with activity   Further, I certify that my clinical findings support that this patient is homebound due to: Unable to leave home safely without assistance   Home Health   Complete by: As directed    To provide the following care/treatments:  PT OT     Increase activity slowly   Complete by: As directed       Allergies as of 08/05/2024       Reactions   Sulfa Antibiotics Other (See Comments)   Unknown, childhood allergy    Sulfonamide Derivatives Other (See Comments)   UNSURE   Zestril  [lisinopril ] Cough        Medication List     STOP taking these medications    ciprofloxacin  250 MG tablet Commonly known as: CIPRO        TAKE these medications    amitriptyline  50 MG tablet Commonly known as: ELAVIL  TAKE 1 TABLET (50 MG TOTAL) BY MOUTH AT BEDTIME.   aspirin  EC 81 MG tablet Take 81 mg by mouth at bedtime.   cefadroxil  500 MG capsule Commonly known as: DURICEF Take 1 capsule (500 mg total) by mouth 2 (two) times daily for 2 days.   fluticasone  50 MCG/ACT nasal spray Commonly known as: FLONASE  Place 2 sprays into both nostrils daily. What changed:  how much to take when to take this   furosemide  40 MG tablet Commonly known as: LASIX  Take 1 tablet (40 mg total) by mouth daily.   guaiFENesin -dextromethorphan  100-10 MG/5ML syrup Commonly known as: ROBITUSSIN DM Take 5 mLs by mouth every 4 (four) hours as needed for cough (chest congestion).   hydrALAZINE  10 MG tablet Commonly known as: APRESOLINE  TAKE 1 TABLET BY MOUTH 3 TIMES DAILY. KEEP UPCOMING APPOINTMENT FOR REFILLS   isosorbide   mononitrate 30 MG 24 hr tablet Commonly known as: IMDUR  Take 1 tablet (30 mg total) by mouth daily.   loratadine  10 MG tablet Commonly known as: CLARITIN  Take 1 tablet (10 mg total) by mouth daily.   methocarbamol  500 MG tablet Commonly known as: ROBAXIN  Take 1 tablet (500 mg total) by mouth every 6 (six) hours as needed for muscle spasms.   metoprolol  tartrate 25 MG tablet Commonly known as: LOPRESSOR  Take 2 tablets (50 mg total) by mouth 2 (two) times daily.   nitroGLYCERIN  0.4 MG SL tablet Commonly known as: NITROSTAT  Place 1 tablet (0.4 mg total) under the tongue every 5 (five)  minutes as needed for chest pain.   Pain Relief Extra Strength 500 MG tablet Generic drug: acetaminophen  Take 2 tablets (1,000 mg total) by mouth every 6 (six) hours as needed (back pain.). What changed:  when to take this reasons to take this   polyethylene glycol powder 17 GM/SCOOP powder Commonly known as: GLYCOLAX /MIRALAX  Take 17 g by mouth daily as needed for mild constipation.   predniSONE  20 MG tablet Commonly known as: DELTASONE  Take 2 tablets (40 mg total) by mouth daily with breakfast for 5 days. Start taking on: August 06, 2024   rosuvastatin  20 MG tablet Commonly known as: CRESTOR  TAKE 1 TABLET (20 MG TOTAL) BY MOUTH AT BEDTIME. KEEP UPCOMING APPOINTMENT FOR REFILLS   Stiolto Respimat  2.5-2.5 MCG/ACT Aers Generic drug: Tiotropium Bromide-Olodaterol INHALE 2 PUFFS INTO THE LUNGS DAILY. NEED APPOINTMENT FOR FURTHER REFILLS What changed: See the new instructions.   topiramate  50 MG tablet Commonly known as: TOPAMAX  TAKE 1 TABLET (50 MG TOTAL) BY MOUTH DAILY. What changed: when to take this   traMADol  50 MG tablet Commonly known as: ULTRAM  Take 1 tablet (50 mg total) by mouth every 12 (twelve) hours as needed. Can use up to twice daily if needed; do not exceed 30 tabs in 1 month What changed:  when to take this reasons to take this additional instructions         Follow-up Information     Purcell Emil Schanz, MD Follow up in 1 week(s).   Specialty: Internal Medicine Contact information: [address] [place] [address] [phone]                Allergies[1]  The results of significant diagnostics from this hospitalization (including imaging, microbiology, ancillary and laboratory) are listed below for reference.    Microbiology: Recent Results (from the past 240 hours)  Respiratory (~20 pathogens) panel by PCR     Status: Abnormal   Collection Time: 08/01/24  4:36 PM   Specimen: Nasopharyngeal Swab; Respiratory  Result Value Ref Range Status   Adenovirus NOT DETECTED NOT DETECTED Final   Coronavirus 229E NOT DETECTED NOT DETECTED Final    Comment: (NOTE) The Coronavirus on the Respiratory Panel, DOES NOT test for the novel  Coronavirus (2019 nCoV)    Coronavirus HKU1 NOT DETECTED NOT DETECTED Final   Coronavirus NL63 NOT DETECTED NOT DETECTED Final   Coronavirus OC43 NOT DETECTED NOT DETECTED Final   Metapneumovirus NOT DETECTED NOT DETECTED Final   Rhinovirus / Enterovirus NOT DETECTED NOT DETECTED Final   Influenza A NOT DETECTED NOT DETECTED Final   Influenza B NOT DETECTED NOT DETECTED Final   Parainfluenza Virus 1 NOT DETECTED NOT DETECTED Final   Parainfluenza Virus 2 NOT DETECTED NOT DETECTED Final   Parainfluenza Virus 3 DETECTED (A) NOT DETECTED Final   Parainfluenza Virus 4 NOT DETECTED NOT DETECTED Final   Respiratory Syncytial Virus NOT DETECTED NOT DETECTED Final   Bordetella pertussis NOT DETECTED NOT DETECTED Final   Bordetella Parapertussis NOT DETECTED NOT DETECTED Final   Chlamydophila pneumoniae NOT DETECTED NOT DETECTED Final   Mycoplasma pneumoniae NOT DETECTED NOT DETECTED Final    Comment: Performed at [facility], [address] [place], [place] [address]  Culture, blood (routine x 2) Call MD if unable to obtain prior to antibiotics being given     Status: None (Preliminary result)    Collection Time: 08/01/24  5:25 PM   Specimen: BLOOD RIGHT ARM  Result Value Ref Range Status  Specimen Description   Final    BLOOD RIGHT ARM Performed at [facility], [address] [place], [place] [address]    Special Requests   Final    BOTTLES DRAWN AEROBIC ONLY Blood Culture adequate volume Performed at [facility], [address], [place], [place] [address]    Culture   Final    NO GROWTH 4 DAYS Performed at [facility], [address] [place], [place] [address]    Report Status PENDING  Incomplete  Culture, blood (routine x 2) Call MD if unable to obtain prior to antibiotics being given     Status: None (Preliminary result)   Collection Time: 08/01/24  5:30 PM   Specimen: BLOOD RIGHT HAND  Result Value Ref Range Status   Specimen Description   Final    BLOOD RIGHT HAND Performed at [facility], [address], [place], [place] [address]    Special Requests   Final    BOTTLES DRAWN AEROBIC ONLY Blood Culture adequate volume Performed at [facility], [address], [place], [place] [address]    Culture   Final    NO GROWTH 4 DAYS Performed at [facility], [address], [place], [place] [address]    Report Status PENDING  Incomplete  Urine Culture (for pregnant, neutropenic or urologic patients or patients with an indwelling urinary catheter)     Status: None   Collection Time: 08/02/24  5:47 AM   Specimen: Urine, Clean Catch  Result Value Ref Range Status   Specimen Description   Final    URINE, CLEAN CATCH Performed at [facility], [address], [place], [place] [address]    Special Requests   Final    NONE Performed at [facility], [address] [place], [place] [address]    Culture   Final    NO GROWTH Performed at [facility], [address] [place], [place] [address]    Report Status 08/03/2024 FINAL  Final  Expectorated Sputum  Assessment w Gram Stain, Rflx to Resp Cult     Status: None   Collection Time: 08/02/24  5:48 AM   Specimen: Sputum  Result Value Ref Range Status   Specimen Description SPUTUM  Final   Special Requests NONE  Final   Sputum evaluation   Final    THIS SPECIMEN IS ACCEPTABLE FOR SPUTUM CULTURE Performed at [facility], [address] [place], [place] [address]    Report Status 08/02/2024 FINAL  Final  Culture, Respiratory w Gram Stain     Status: None   Collection Time: 08/02/24  5:48 AM   Specimen: SPU  Result Value Ref Range Status   Specimen Description   Final    SPUTUM Performed at [facility], [address], [place], [place] [address]    Special Requests   Final    NONE Reflexed from [phone] Performed at [facility], [address], [place], [place] [address]    Gram Stain   Final    ABUNDANT WBC PRESENT, PREDOMINANTLY PMN RARE GRAM POSITIVE RODS    Culture   Final    RARE Normal respiratory flora-no Staph aureus or Pseudomonas seen Performed at [facility], [address], [place], [place] [address]    Report Status 08/04/2024 FINAL  Final    Procedures/Studies: VAS US  LOWER EXTREMITY VENOUS (DVT) (ONLY [facility] &  [facility]) Result Date: 08/02/2024  Lower Venous DVT Study Patient Name:  [name]  Date of Exam:   08/02/2024 Medical Rec #: [id number]         Accession #:    [id number] Date of Birth: 1946/03/15         Patient Gender: F Patient Age:   79 years Exam Location:  [facility] Procedure:      VAS US  LOWER EXTREMITY VENOUS (DVT) Referring Phys: LONNI SAKAI --------------------------------------------------------------------------------  Indications: Acute respiratory failure.  Limitations: Poor ultrasound/tissue interface and pain intolerance. Comparison Study: No previous exams Performing Technologist: Jody Hill RVT, RDMS  Examination Guidelines: A complete evaluation includes B-mode imaging,  spectral Doppler, color Doppler, and power Doppler as needed of all accessible portions of each vessel. Bilateral testing is considered an integral part of a complete examination. Limited examinations for reoccurring indications may be performed as noted. The reflux portion of the exam is performed with the patient in reverse Trendelenburg.  +--------+---------------+---------+-----------+----------+--------------------+ RIGHT   CompressibilityPhasicitySpontaneityPropertiesThrombus Aging       +--------+---------------+---------+-----------+----------+--------------------+ CFV     Full           Yes      Yes                                       +--------+---------------+---------+-----------+----------+--------------------+ SFJ     Full                                                              +--------+---------------+---------+-----------+----------+--------------------+ FV Prox Full           Yes      Yes                                       +--------+---------------+---------+-----------+----------+--------------------+ FV Mid  Full           Yes      Yes                                       +--------+---------------+---------+-----------+----------+--------------------+ FV                     Yes      Yes                  Patent by            Distal                                               color/doppler        +--------+---------------+---------+-----------+----------+--------------------+ PFV     Full                                                              +--------+---------------+---------+-----------+----------+--------------------+  POP     Full           Yes      Yes                                       +--------+---------------+---------+-----------+----------+--------------------+ PTV     Full                                                               +--------+---------------+---------+-----------+----------+--------------------+ PERO    Full                                                              +--------+---------------+---------+-----------+----------+--------------------+ unable to tolerate compression to distal FV  +--------+---------------+---------+-----------+----------+--------------------+ LEFT    CompressibilityPhasicitySpontaneityPropertiesThrombus Aging       +--------+---------------+---------+-----------+----------+--------------------+ CFV     Full           Yes      Yes                                       +--------+---------------+---------+-----------+----------+--------------------+ SFJ     Full                                                              +--------+---------------+---------+-----------+----------+--------------------+ FV Prox Full           Yes      Yes                                       +--------+---------------+---------+-----------+----------+--------------------+ FV Mid  Full           Yes      Yes                                       +--------+---------------+---------+-----------+----------+--------------------+ FV                     Yes      Yes                  patent by            Distal                                               color/doppler        +--------+---------------+---------+-----------+----------+--------------------+ PFV     Full                                                              +--------+---------------+---------+-----------+----------+--------------------+  POP     Full           Yes      Yes                                       +--------+---------------+---------+-----------+----------+--------------------+ PTV     Full                                                              +--------+---------------+---------+-----------+----------+--------------------+ PERO    Full                                                               +--------+---------------+---------+-----------+----------+--------------------+ unable to tolerate compression to distal FV    Summary: BILATERAL: - No evidence of deep vein thrombosis seen in the lower extremities, bilaterally. -No evidence of popliteal cyst, bilaterally.   *See table(s) above for measurements and observations. Electronically signed by Norman Serve on 08/02/2024 at 2:59:16 PM.    Final    CT Chest Wo Contrast Result Date: 08/01/2024 EXAM: CT CHEST WITHOUT CONTRAST 08/01/2024 02:24:44 PM TECHNIQUE: CT of the chest was performed without the administration of intravenous contrast. Multiplanar reformatted images are provided for review. Automated exposure control, iterative reconstruction, and/or weight based adjustment of the mA/kV was utilized to reduce the radiation dose to as low as reasonably achievable. COMPARISON: CT chest abdomen and pelvis 05/12/2024. CLINICAL HISTORY: Chest wall pain, nontraumatic, infection or inflammation suspected, xray done; Productive cough with hypoxia and shortness of breath. X-ray does not show pneumonia but clinically very high concern for pneumonia. Cannot get PE study due to kidney function. Hospitalist requested noncontrasted CT. Cough. FINDINGS: MEDIASTINUM: Pacemaker is present. Coronary and aortic atherosclerotic calcifications are noted. Thoracic aortic arch graft/stent is present. The heart is mildly enlarged. The central airways are clear. LYMPH NODES: No mediastinal, hilar or axillary lymphadenopathy. LUNGS AND PLEURA: There is minimal atelectasis in the posterior third of the left lung bases. There are patchy peripheral ground glass opacities and reticular opacities in the posterior inferior left lower lobe, new from prior. No pleural effusion or pneumothorax. SOFT TISSUES/BONES: The bones are diffusely osteopenic. Mild T9 compression deformity is unchanged. Degenerative changes affect the spine. No acute  abnormality of the soft tissues. UPPER ABDOMEN: There is a small hiatal hernia. Subcentimeter hypodensity in the dome of the bladder is too small to characterize, likely a cyst. Limited images of the upper abdomen demonstrates no other acute abnormality. IMPRESSION: 1. New patchy peripheral ground-glass and reticular opacities in the posterior inferior left lower lobe, compatible with infection. Electronically signed by: Greig Pique MD 08/01/2024 03:34 PM EST RP Workstation: [id number]   DG Chest Portable 1 View Result Date: 08/01/2024 CLINICAL DATA:  Shortness of breath.  Cough. EXAM: PORTABLE CHEST 1 VIEW COMPARISON:  05/19/2024. FINDINGS: The heart size and mediastinal contours are unchanged. Stable single lead AICD. Endoluminal stent graft within the aortic arch. No overt pulmonary edema, focal consolidation, pleural effusion, or pneumothorax. No acute osseous abnormality. IMPRESSION: No acute  cardiopulmonary findings. Electronically Signed   By: Harrietta Sherry M.D.   On: 08/01/2024 12:12    Labs: BNP (last 3 results) No results for input(s): BNP in the last 8760 hours. Basic Metabolic Panel: Recent Labs  Lab 08/01/24 1115 08/02/24 0418 08/02/24 0825 08/03/24 0422 08/04/24 0443  NA 141 138  --  138 138  K 3.0* 5.5* 4.9 4.3 4.7  CL 105 110  --  110 107  CO2 21* 18*  --  20* 23  GLUCOSE 158* 105*  --  114* 113*  BUN 26* 30*  --  30* 30*  CREATININE 1.88* 1.75*  --  1.72* 1.83*  CALCIUM  9.6 9.2  --  8.8* 8.9  MG  --  3.3*  --  2.9*  --   PHOS  --  2.8  --  3.8  --    Liver Function Tests: Recent Labs  Lab 08/01/24 1115 08/02/24 0418 08/03/24 0422  AST 29 21  --   ALT 23 17  --   ALKPHOS 151* 97  --   BILITOT 0.4 0.3  --   PROT 7.9 6.9  --   ALBUMIN  4.1 3.6 3.4*   Recent Labs  Lab 08/01/24 1115  LIPASE 53*   No results for input(s): AMMONIA in the last 168 hours. CBC: Recent Labs  Lab 08/01/24 1115 08/02/24 0418 08/03/24 0422  WBC 17.4* 11.7* 11.2*   NEUTROABS 7.5 8.7*  --   HGB 12.0 10.2* 9.7*  HCT 36.3 31.4* 29.7*  MCV 103.1* 104.7* 105.3*  PLT 277 238 238   CBG: No results for input(s): GLUCAP in the last 168 hours. Hgb A1c No results for input(s): HGBA1C in the last 72 hours. Anemia work up No results for input(s): VITAMINB12, FOLATE, FERRITIN, TIBC, IRON, RETICCTPCT in the last 72 hours. Cardiac Enzymes: No results for input(s): CKTOTAL, CKMB, CKMBINDEX, TROPONINI in the last 168 hours. BNP: Invalid input(s): POCBNP D-Dimer No results for input(s): DDIMER in the last 72 hours. Lipid Profile No results for input(s): CHOL, HDL, LDLCALC, TRIG, CHOLHDL, LDLDIRECT in the last 72 hours. Thyroid  function studies No results for input(s): TSH, T4TOTAL, T3FREE, THYROIDAB in the last 72 hours.  Invalid input(s): FREET3 Urinalysis    Component Value Date/Time   COLORURINE YELLOW 08/02/2024 0547   APPEARANCEUR CLEAR 08/02/2024 0547   LABSPEC 1.019 08/02/2024 0547   PHURINE 5.0 08/02/2024 0547   GLUCOSEU 50 (A) 08/02/2024 0547   GLUCOSEU NEGATIVE 04/20/2023 1047   HGBUR NEGATIVE 08/02/2024 0547   BILIRUBINUR NEGATIVE 08/02/2024 0547   BILIRUBINUR negative 04/18/2024 1135   BILIRUBINUR negative 11/21/2021 1416   KETONESUR NEGATIVE 08/02/2024 0547   PROTEINUR NEGATIVE 08/02/2024 0547   UROBILINOGEN 0.2 04/18/2024 1135   UROBILINOGEN 0.2 04/20/2023 1047   NITRITE NEGATIVE 08/02/2024 0547   LEUKOCYTESUR NEGATIVE 08/02/2024 0547   Sepsis Labs Recent Labs  Lab 08/01/24 1115 08/02/24 0418 08/03/24 0422  WBC 17.4* 11.7* 11.2*   Microbiology Recent Results (from the past 240 hours)  Respiratory (~20 pathogens) panel by PCR     Status: Abnormal   Collection Time: 08/01/24  4:36 PM   Specimen: Nasopharyngeal Swab; Respiratory  Result Value Ref Range Status   Adenovirus NOT DETECTED NOT DETECTED Final   Coronavirus 229E NOT DETECTED NOT DETECTED Final    Comment:  (NOTE) The Coronavirus on the Respiratory Panel, DOES NOT test for the novel  Coronavirus (2019 nCoV)    Coronavirus HKU1 NOT DETECTED NOT DETECTED Final   Coronavirus NL63 NOT DETECTED  NOT DETECTED Final   Coronavirus OC43 NOT DETECTED NOT DETECTED Final   Metapneumovirus NOT DETECTED NOT DETECTED Final   Rhinovirus / Enterovirus NOT DETECTED NOT DETECTED Final   Influenza A NOT DETECTED NOT DETECTED Final   Influenza B NOT DETECTED NOT DETECTED Final   Parainfluenza Virus 1 NOT DETECTED NOT DETECTED Final   Parainfluenza Virus 2 NOT DETECTED NOT DETECTED Final   Parainfluenza Virus 3 DETECTED (A) NOT DETECTED Final   Parainfluenza Virus 4 NOT DETECTED NOT DETECTED Final   Respiratory Syncytial Virus NOT DETECTED NOT DETECTED Final   Bordetella pertussis NOT DETECTED NOT DETECTED Final   Bordetella Parapertussis NOT DETECTED NOT DETECTED Final   Chlamydophila pneumoniae NOT DETECTED NOT DETECTED Final   Mycoplasma pneumoniae NOT DETECTED NOT DETECTED Final    Comment: Performed at [facility], [address] [place], [place] [address]  Culture, blood (routine x 2) Call MD if unable to obtain prior to antibiotics being given     Status: None (Preliminary result)   Collection Time: 08/01/24  5:25 PM   Specimen: BLOOD RIGHT ARM  Result Value Ref Range Status   Specimen Description   Final    BLOOD RIGHT ARM Performed at [facility], [address], [place], [place] [address]    Special Requests   Final    BOTTLES DRAWN AEROBIC ONLY Blood Culture adequate volume Performed at [facility], [address], [place], [place] [address]    Culture   Final    NO GROWTH 4 DAYS Performed at [facility], [address], [place], [place] [address]    Report Status PENDING  Incomplete  Culture, blood (routine x 2) Call MD if unable to obtain prior to antibiotics being given     Status: None (Preliminary result)   Collection Time: 08/01/24  5:30 PM    Specimen: BLOOD RIGHT HAND  Result Value Ref Range Status   Specimen Description   Final    BLOOD RIGHT HAND Performed at [facility], [address] [place], [place] [address]    Special Requests   Final    BOTTLES DRAWN AEROBIC ONLY Blood Culture adequate volume Performed at [facility], [address] [place], [place] [address]    Culture   Final    NO GROWTH 4 DAYS Performed at [facility], [address], [place], [place] [address]    Report Status PENDING  Incomplete  Urine Culture (for pregnant, neutropenic or urologic patients or patients with an indwelling urinary catheter)     Status: None   Collection Time: 08/02/24  5:47 AM   Specimen: Urine, Clean Catch  Result Value Ref Range Status   Specimen Description   Final    URINE, CLEAN CATCH Performed at [facility], [address] [place], [place] [address]    Special Requests   Final    NONE Performed at [facility], [address] [place], [place] [address]    Culture   Final    NO GROWTH Performed at [facility], [address], [place], [place] [address]    Report Status 08/03/2024 FINAL  Final  Expectorated Sputum Assessment w Gram Stain, Rflx to Resp Cult     Status: None   Collection Time: 08/02/24  5:48 AM   Specimen: Sputum  Result Value Ref Range Status   Specimen Description SPUTUM  Final   Special  Requests NONE  Final   Sputum evaluation   Final    THIS SPECIMEN IS ACCEPTABLE FOR SPUTUM CULTURE Performed at [facility], [address], [place], [place] [address]    Report Status 08/02/2024 FINAL  Final  Culture, Respiratory w Gram Stain     Status: None   Collection Time: 08/02/24  5:48 AM   Specimen: SPU  Result Value Ref Range Status   Specimen Description   Final    SPUTUM Performed at [facility], [address], [place], [place] [address]    Special Requests   Final     NONE Reflexed from [id number] Performed at [facility], [address] [place], [place] [address]    Gram Stain   Final    ABUNDANT WBC PRESENT, PREDOMINANTLY PMN RARE GRAM POSITIVE RODS    Culture   Final    RARE Normal respiratory flora-no Staph aureus or Pseudomonas seen Performed at [facility], [address], [place], [place] [address]    Report Status 08/04/2024 FINAL  Final     Time coordinating discharge: 35  minutes  SIGNED: Mennie LAMY, MD  [facility] 08/05/2024, 10:01 AM  If 7PM-7AM, please contact night-coverage [email or web address]       [1]  Allergies Allergen Reactions   Sulfa Antibiotics Other (See Comments)    Unknown, childhood allergy    Sulfonamide Derivatives Other (See Comments)    UNSURE   Zestril  [Lisinopril ] Cough

## 2024-08-05 NOTE — Progress Notes (Signed)
 Confirmed with infectious disease that patient DOES NOT REQUIRE any precautions for respiratory panel results.

## 2024-08-05 NOTE — TOC Transition Note (Signed)
 Transition of Care [facility]) - Discharge Note   Patient Details  Name: [name] MRN: [id number] Date of Birth: [birth year]  [facility] CM/SW Contact:  Tawni [name], LCSW Phone Number: 08/05/2024, 10:51 AM   Clinical Narrative:    CSW spoke with pt to discuss rec for [facility] services. Pt is requesting CSW to contact her son. CSW spoke with Vinie to discuss recs for [facility] service and offer choice. Pt's [facility]. Pt's son reports pt's brother will provided transportation home. Pt has declined Rolling walker, stating she has one at home. No further ICM needs, ICM sign off.     Final next level of care: Home w Home Health Services Barriers to Discharge: Barriers Resolved   Patient Goals and CMS Choice Patient states their goals for this hospitalization and ongoing recovery are:: retrun home with home health services CMS Medicare.gov Compare Post Acute Care list provided to:: Patient Choice offered to / list presented to : Patient, Adult Children      Discharge Placement                       Discharge Plan and Services Additional resources added to the After Visit Summary for                                       Social Drivers of Health (SDOH) Interventions SDOH Screenings   Food Insecurity: No Food Insecurity (08/01/2024)  Housing: Low Risk (08/01/2024)  Transportation Needs: No Transportation Needs (08/01/2024)  Utilities: Not At Risk (08/01/2024)  Alcohol Screen: Low Risk (02/23/2024)  Depression (PHQ2-9): Low Risk (03/16/2024)  Recent Concern: Depression (PHQ2-9) - Medium Risk (12/23/2023)  Financial Resource Strain: Low Risk (02/23/2024)  Physical Activity: Inactive (02/23/2024)  Social Connections: Moderately Integrated (08/01/2024)  Stress: No Stress Concern Present (02/23/2024)  Tobacco Use: Medium Risk (08/02/2024)  Health Literacy: Inadequate Health Literacy (02/23/2024)     Readmission Risk Interventions     No data to display

## 2024-08-05 NOTE — Progress Notes (Addendum)
 Occupational Therapy Treatment Patient Details Name: [name] MRN: [id number] DOB: 1946-05-25 Today's Date: 08/05/2024   History of present illness 79 yo female admitted with acute respiratory failure, Pna, COPD exac, SIRS. Hx of CM, CHF, COPD, DDD, AICD, STEMI, PE, thoracic AA, memory loss,  hearing loss, depression, HF   OT comments  Pt. Seen for skilled OT treatment session.  Pt. Able to to complete bed mobility MOD I.  In room ambulation with RW to/from b.room with MOD I.  All aspects of toileting with MOD I.  Note d/c home later today.        If plan is discharge home, recommend the following:  Assistance with cooking/housework;Assist for transportation   Equipment Recommendations       Recommendations for Other Services      Precautions / Restrictions Precautions Precautions: Fall       Mobility Bed Mobility Overal bed mobility: Modified Independent             General bed mobility comments: self able to get OOB and back into bed without physical assistance    Transfers Overall transfer level: Needs assistance Equipment used: Rolling walker (2 wheels) Transfers: Sit to/from Stand, Bed to chair/wheelchair/BSC Sit to Stand: Modified independent (Device/Increase time) Stand pivot transfers: Modified independent (Device/Increase time)         General transfer comment: pt. requested use of RW today.  says she has one at home and will use it as needed     Balance                                           ADL either performed or assessed with clinical judgement   ADL Overall ADL's : Needs assistance/impaired     Grooming: Wash/dry hands;Supervision/safety;Standing  LB dressing: S figure four seated eob                   Toilet Transfer: Supervision/safety;Ambulation;Grab bars   Toileting- Clothing Manipulation and Hygiene: Supervision/safety;Set up;Sit to/from stand       Functional mobility during ADLs:  Supervision/safety      Extremity/Trunk Assessment              Vision       Perception     Praxis     Communication Communication Communication: No apparent difficulties   Cognition Arousal: Alert Behavior During Therapy: WFL for tasks assessed/performed               OT - Cognition Comments: Oriented x4                 Following commands: Intact        Cueing   Cueing Techniques: Verbal cues  Exercises      Shoulder Instructions       General Comments      Pertinent Vitals/ Pain       Pain Assessment Pain Assessment: No/denies pain  Home Living                                          Prior Functioning/Environment              Frequency  Min 2X/week        Progress Toward Goals  OT Goals(current goals can now  be found in the care plan section)  Progress towards OT goals: Progressing toward goals     Plan      Co-evaluation                 AM-PAC OT 6 Clicks Daily Activity     Outcome Measure   Help from another person eating meals?: None Help from another person taking care of personal grooming?: A Little Help from another person toileting, which includes using toliet, bedpan, or urinal?: A Little Help from another person bathing (including washing, rinsing, drying)?: A Little Help from another person to put on and taking off regular upper body clothing?: A Little Help from another person to put on and taking off regular lower body clothing?: A Little 6 Click Score: 19    End of Session Equipment Utilized During Treatment: Gait belt  OT Visit Diagnosis: Muscle weakness (generalized) (M62.81)   Activity Tolerance Patient tolerated treatment well   Patient Left in bed;with call bell/phone within reach   Nurse Communication Other (comment) (pt. had questions regarding her medications prior to d/c)        Time: 8956-8948 OT Time Calculation (min): 8 min  Charges: OT General  Charges $OT Visit: 1 Visit OT Treatments $Self Care/Home Management : 8-22 mins  Randall, COTA/L Acute Rehabilitation [phone]   CHRISTELLA Nest Lorraine-COTA/L  08/05/2024, 12:06 PM

## 2024-08-05 NOTE — Progress Notes (Signed)
 SATURATION QUALIFICATIONS: (This note is used to comply with regulatory documentation for home oxygen)  Patient Saturations on Room Air at Rest = 97%  Patient Saturations on Room Air while Ambulating = 100%  Patient Saturations on na Liters of oxygen while Ambulating = NA%  Please briefly explain why patient needs home oxygen: does not require oxygen

## 2024-08-05 NOTE — Progress Notes (Signed)
 Discharge meds in a secure bag delivered to patient by this RN

## 2024-08-06 LAB — CULTURE, BLOOD (ROUTINE X 2)
Culture: NO GROWTH
Culture: NO GROWTH
Special Requests: ADEQUATE
Special Requests: ADEQUATE

## 2024-08-08 ENCOUNTER — Other Ambulatory Visit: Payer: Self-pay | Admitting: Emergency Medicine

## 2024-08-08 ENCOUNTER — Telehealth: Payer: Self-pay

## 2024-08-08 DIAGNOSIS — G43009 Migraine without aura, not intractable, without status migrainosus: Secondary | ICD-10-CM

## 2024-08-08 LAB — CUP PACEART REMOTE DEVICE CHECK
Battery Remaining Longevity: 103 mo
Battery Remaining Percentage: 83 %
Battery Voltage: 3.01 V
Brady Statistic RV Percent Paced: 1 %
Date Time Interrogation Session: 20260109123435
HighPow Impedance: 96 Ohm
Implantable Lead Connection Status: 753985
Implantable Lead Implant Date: 20130108
Implantable Lead Location: 753860
Implantable Pulse Generator Implant Date: 20240409
Lead Channel Impedance Value: 480 Ohm
Lead Channel Pacing Threshold Amplitude: 0.75 V
Lead Channel Pacing Threshold Pulse Width: 0.5 ms
Lead Channel Sensing Intrinsic Amplitude: 12 mV
Lead Channel Setting Pacing Amplitude: 2.5 V
Lead Channel Setting Pacing Pulse Width: 0.5 ms
Lead Channel Setting Sensing Sensitivity: 0.5 mV
Pulse Gen Serial Number: 211016490
Zone Setting Status: 755011

## 2024-08-08 NOTE — Transitions of Care (Post Inpatient/ED Visit) (Unsigned)
" ° °  08/08/2024  Name: [name] MRN: [id number] DOB: 1946-02-12  Today's TOC FU Call Status: Today's TOC FU Call Status:: Unsuccessful Call (1st Attempt) Unsuccessful Call (1st Attempt) Date: 08/08/24  Attempted to reach the patient regarding the most recent Inpatient/ED visit.  Follow Up Plan: Additional outreach attempts will be made to reach the patient to complete the Transitions of Care (Post Inpatient/ED visit) call.   Signature Julian Lemmings, LPN [facility] Nurse Health Advisor Direct Dial [phone]  "

## 2024-08-09 ENCOUNTER — Telehealth: Payer: Self-pay

## 2024-08-09 ENCOUNTER — Ambulatory Visit: Payer: Self-pay | Admitting: Cardiology

## 2024-08-09 NOTE — Progress Notes (Signed)
 Remote ICD Transmission

## 2024-08-09 NOTE — Telephone Encounter (Signed)
 Copied from CRM #[id number]. Topic: Clinical - Home Health Verbal Orders >> Aug 09, 2024 10:23 AM Wess RAMAN wrote: Caller/Agency: Cecilia/ [facility] Callback Number: [phone] Service Requested: Physical Therapy Frequency: 2 week 1/ 1 week 3/ 1 every other 4 weeks/ 1 week 1 Any new concerns about the patient? Yes, 4 wheel walker with a seat and shower chair with back rest

## 2024-08-09 NOTE — Telephone Encounter (Signed)
 Copied from CRM [id number]. Topic: Clinical - Order For Equipment >> Aug 09, 2024 10:26 AM Wess RAMAN wrote: Reason for CRM: Ted, physical therapist from [facility], is requesting a 4 wheel walker with a seat and shower chair with back rest be sent in for patient to any medical supplier.  Callback #: [phone]

## 2024-08-09 NOTE — Transitions of Care (Post Inpatient/ED Visit) (Signed)
" ° °  08/09/2024  Name: [name] MRN: [id number] DOB: Nov 17, 1945  Today's TOC FU Call Status: Today's TOC FU Call Status:: Successful TOC FU Call Completed Unsuccessful Call (1st Attempt) Date: 08/08/24 [facility] FU Call Complete Date: 08/09/24  Patient's Name and Date of Birth confirmed. Name, DOB  Transition Care Management Follow-up Telephone Call Discharge Facility: [facility]) Type of Discharge: Inpatient Admission Primary Inpatient Discharge Diagnosis:: hypoxia How have you been since you were released from the hospital?: Better Any questions or concerns?: No  Items Reviewed: Medications obtained,verified, and reconciled?: Yes (Medications Reviewed) Any new allergies since your discharge?: No Dietary orders reviewed?: Yes Do you have support at home?: No  Medications Reviewed Today: Medications Reviewed Today   Medications were not reviewed in this encounter     Home Care and Equipment/Supplies: Were Home Health Services Ordered?: Yes Name of Home Health Agency:: [facility] Has Agency set up a time to come to your home?: Yes First Home Health Visit Date: 08/10/24 Any new equipment or medical supplies ordered?: NA  Functional Questionnaire: Do you need assistance with bathing/showering or dressing?: No Do you need assistance with meal preparation?: No Do you need assistance with eating?: No Do you have difficulty maintaining continence: No Do you need assistance with getting out of bed/getting out of a chair/moving?: No Do you have difficulty managing or taking your medications?: No  Follow up appointments reviewed: PCP Follow-up appointment confirmed?: Yes Date of PCP follow-up appointment?: 08/15/24 Follow-up Provider: [facility] Follow-up appointment confirmed?: NA Do you need transportation to your follow-up appointment?: No Do you understand care options if your condition(s) worsen?: Yes-patient verbalized understanding    SIGNATURE  Julian Lemmings, LPN [facility] Nurse Health Advisor Direct Dial [phone]  "

## 2024-08-10 ENCOUNTER — Other Ambulatory Visit: Payer: Self-pay

## 2024-08-10 DIAGNOSIS — M545 Low back pain, unspecified: Secondary | ICD-10-CM

## 2024-08-10 DIAGNOSIS — M48061 Spinal stenosis, lumbar region without neurogenic claudication: Secondary | ICD-10-CM

## 2024-08-10 NOTE — Telephone Encounter (Signed)
 This has been done and noted in a different encounter

## 2024-08-10 NOTE — Telephone Encounter (Signed)
 I have placed and order for both and also have sent a message to [employer] [facility]

## 2024-08-10 NOTE — Telephone Encounter (Signed)
 Please advise

## 2024-08-10 NOTE — Telephone Encounter (Signed)
 Approval as requested please

## 2024-08-10 NOTE — Telephone Encounter (Signed)
Approve as requested.

## 2024-08-11 ENCOUNTER — Inpatient Hospital Stay: Admitting: Emergency Medicine

## 2024-08-15 ENCOUNTER — Ambulatory Visit: Admitting: Emergency Medicine

## 2024-08-15 ENCOUNTER — Encounter: Payer: Self-pay | Admitting: Emergency Medicine

## 2024-08-15 VITALS — BP 118/82 | HR 93 | Temp 97.9°F | Ht 62.0 in | Wt 169.0 lb

## 2024-08-15 DIAGNOSIS — J441 Chronic obstructive pulmonary disease with (acute) exacerbation: Secondary | ICD-10-CM | POA: Diagnosis not present

## 2024-08-15 DIAGNOSIS — Z09 Encounter for follow-up examination after completed treatment for conditions other than malignant neoplasm: Secondary | ICD-10-CM

## 2024-08-15 DIAGNOSIS — J189 Pneumonia, unspecified organism: Secondary | ICD-10-CM | POA: Diagnosis not present

## 2024-08-15 DIAGNOSIS — I25119 Atherosclerotic heart disease of native coronary artery with unspecified angina pectoris: Secondary | ICD-10-CM

## 2024-08-15 DIAGNOSIS — I5022 Chronic systolic (congestive) heart failure: Secondary | ICD-10-CM

## 2024-08-15 DIAGNOSIS — N1832 Chronic kidney disease, stage 3b: Secondary | ICD-10-CM

## 2024-08-15 LAB — CBC WITH DIFFERENTIAL/PLATELET
Basophils Absolute: 0 K/uL (ref 0.0–0.1)
Basophils Relative: 0.4 % (ref 0.0–3.0)
Eosinophils Absolute: 0.1 K/uL (ref 0.0–0.7)
Eosinophils Relative: 0.5 % (ref 0.0–5.0)
HCT: 33.9 % — ABNORMAL LOW (ref 36.0–46.0)
Hemoglobin: 11.3 g/dL — ABNORMAL LOW (ref 12.0–15.0)
Lymphocytes Relative: 33 % (ref 12.0–46.0)
Lymphs Abs: 4.6 K/uL — ABNORMAL HIGH (ref 0.7–4.0)
MCHC: 33.5 g/dL (ref 30.0–36.0)
MCV: 102.9 fl — ABNORMAL HIGH (ref 78.0–100.0)
Monocytes Absolute: 0.8 K/uL (ref 0.1–1.0)
Monocytes Relative: 6 % (ref 3.0–12.0)
Neutro Abs: 8.4 K/uL — ABNORMAL HIGH (ref 1.4–7.7)
Neutrophils Relative %: 60.1 % (ref 43.0–77.0)
Platelets: 209 K/uL (ref 150.0–400.0)
RBC: 3.29 Mil/uL — ABNORMAL LOW (ref 3.87–5.11)
RDW: 15.3 % (ref 11.5–15.5)
WBC: 13.9 K/uL — ABNORMAL HIGH (ref 4.0–10.5)

## 2024-08-15 LAB — COMPREHENSIVE METABOLIC PANEL WITH GFR
ALT: 45 U/L — ABNORMAL HIGH (ref 3–35)
AST: 25 U/L (ref 5–37)
Albumin: 3.8 g/dL (ref 3.5–5.2)
Alkaline Phosphatase: 74 U/L (ref 39–117)
BUN: 27 mg/dL — ABNORMAL HIGH (ref 6–23)
CO2: 21 meq/L (ref 19–32)
Calcium: 8.9 mg/dL (ref 8.4–10.5)
Chloride: 111 meq/L (ref 96–112)
Creatinine, Ser: 1.79 mg/dL — ABNORMAL HIGH (ref 0.40–1.20)
GFR: 27.11 mL/min — ABNORMAL LOW
Glucose, Bld: 86 mg/dL (ref 70–99)
Potassium: 4.1 meq/L (ref 3.5–5.1)
Sodium: 140 meq/L (ref 135–145)
Total Bilirubin: 0.5 mg/dL (ref 0.2–1.2)
Total Protein: 6.6 g/dL (ref 6.0–8.3)

## 2024-08-15 NOTE — Assessment & Plan Note (Signed)
 Much improved today.  Normal pulmonary examination In no distress No concerns identified today

## 2024-08-15 NOTE — Assessment & Plan Note (Signed)
Advised to stay well-hydrated and avoid NSAIDs. ?

## 2024-08-15 NOTE — Assessment & Plan Note (Signed)
 Clinically euvolemic No findings of acute CHF

## 2024-08-15 NOTE — Assessment & Plan Note (Signed)
Stable.  No anginal episodes. 

## 2024-08-15 NOTE — Assessment & Plan Note (Signed)
 Much improved. Asymptomatic today No concerns identified

## 2024-08-15 NOTE — Patient Instructions (Signed)
 Health Maintenance After Age 77 After age 27, you are at a higher risk for certain long-term diseases and infections as well as injuries from falls. Falls are a major cause of broken bones and head injuries in people who are older than age 73. Getting regular preventive care can help to keep you healthy and well. Preventive care includes getting regular testing and making lifestyle changes as recommended by your health care provider. Talk with your health care provider about: Which screenings and tests you should have. A screening is a test that checks for a disease when you have no symptoms. A diet and exercise plan that is right for you. What should I know about screenings and tests to prevent falls? Screening and testing are the best ways to find a health problem early. Early diagnosis and treatment give you the best chance of managing medical conditions that are common after age 90. Certain conditions and lifestyle choices may make you more likely to have a fall. Your health care provider may recommend: Regular vision checks. Poor vision and conditions such as cataracts can make you more likely to have a fall. If you wear glasses, make sure to get your prescription updated if your vision changes. Medicine review. Work with your health care provider to regularly review all of the medicines you are taking, including over-the-counter medicines. Ask your health care provider about any side effects that may make you more likely to have a fall. Tell your health care provider if any medicines that you take make you feel dizzy or sleepy. Strength and balance checks. Your health care provider may recommend certain tests to check your strength and balance while standing, walking, or changing positions. Foot health exam. Foot pain and numbness, as well as not wearing proper footwear, can make you more likely to have a fall. Screenings, including: Osteoporosis screening. Osteoporosis is a condition that causes  the bones to get weaker and break more easily. Blood pressure screening. Blood pressure changes and medicines to control blood pressure can make you feel dizzy. Depression screening. You may be more likely to have a fall if you have a fear of falling, feel depressed, or feel unable to do activities that you used to do. Alcohol  use screening. Using too much alcohol  can affect your balance and may make you more likely to have a fall. Follow these instructions at home: Lifestyle Do not drink alcohol  if: Your health care provider tells you not to drink. If you drink alcohol : Limit how much you have to: 0-1 drink a day for women. 0-2 drinks a day for men. Know how much alcohol  is in your drink. In the [place] one drink equals one 12 oz bottle of beer (355 mL), one 5 oz glass of wine (148 mL), or one 1 oz glass of hard liquor (44 mL). Do not use any products that contain nicotine or tobacco. These products include cigarettes, chewing tobacco, and vaping devices, such as e-cigarettes. If you need help quitting, ask your health care provider. Activity  Follow a regular exercise program to stay fit. This will help you maintain your balance. Ask your health care provider what types of exercise are appropriate for you. If you need a cane or walker, use it as recommended by your health care provider. Wear supportive shoes that have nonskid soles. Safety  Remove any tripping hazards, such as rugs, cords, and clutter. Install safety equipment such as grab bars in bathrooms and safety rails on stairs. Keep rooms and walkways  well-lit. General instructions Talk with your health care provider about your risks for falling. Tell your health care provider if: You fall. Be sure to tell your health care provider about all falls, even ones that seem minor. You feel dizzy, tiredness (fatigue), or off-balance. Take over-the-counter and prescription medicines only as told by your health care provider. These include  supplements. Eat a healthy diet and maintain a healthy weight. A healthy diet includes low-fat dairy products, low-fat (lean) meats, and fiber from whole grains, beans, and lots of fruits and vegetables. Stay current with your vaccines. Schedule regular health, dental, and eye exams. Summary Having a healthy lifestyle and getting preventive care can help to protect your health and wellness after age 15. Screening and testing are the best way to find a health problem early and help you avoid having a fall. Early diagnosis and treatment give you the best chance for managing medical conditions that are more common for people who are older than age 42. Falls are a major cause of broken bones and head injuries in people who are older than age 64. Take precautions to prevent a fall at home. Work with your health care provider to learn what changes you can make to improve your health and wellness and to prevent falls. This information is not intended to replace advice given to you by your health care provider. Make sure you discuss any questions you have with your health care provider. Document Revised: 12/03/2020 Document Reviewed: 12/03/2020 [employer] Patient Education  2024 [employer].

## 2024-08-15 NOTE — Progress Notes (Signed)
 [name] 79 y.o.   Chief Complaint  Patient presents with   Follow-up    Hospital F/u pt think she may be experiencing heat burn    HISTORY OF PRESENT ILLNESS: This is a 79 y.o. female here for hospital discharge follow-up. Doing much better. Discharge summary as follows:  Physician Discharge Summary  CHRYSTAL ZEIMET FMW:[id number] DOB: [birth year] DOA: 08/01/2024   PCP: Purcell Emil Schanz, MD   Admit date: 08/01/2024 Discharge date: 08/05/2024 Recommendations for Outpatient Follow-up:  Follow up with PCP in 1 weeks-call for appointment Please obtain BMP/CBC in one week   Discharge Dispo: home w/ hh Discharge Condition: Stable Code Status:   Code Status: Limited: Do not attempt resuscitation (DNR) -DNR-LIMITED -Do Not Intubate/DNI  Diet recommendation:  Diet Order                  Diet Heart Room service appropriate? Yes; Fluid consistency: Thin  Diet effective now                         Brief/Interim Summary: [name] is a 79 y.o. female with PMH of COPD, CAD, ischemic cardiomyopathy, CKD 3B, chronic systolic heart failure status post [facility] Jude ICD 2013, ischemic cardiomyopathy presented to the ED with a 1.5-week history of productive cough of brownish sputum, sore throat, shortness of breath, wheezing and chest tightness and in ED workup concerning for pneumonia. Respiratory viral panel positive for parainfluenza virus 3. CT chest > New patchy peripheral ground-glass and reticular opacities in the posterior inferior left lower lobe, compatible with infection  and was concerning for pneumonia.Patient admitted for acute respiratory distress hypoxia in the setting of pneumonia, parainfluenza infection Initial lactic acidosis 2.1-2.7 subsequently resolved to 1, troponin elevated but flat 26-35-suspecting demand ischemia.  BNP elevated chronically in 1600> now at 1400. Lower extremity Dopplers negative. Urine strep pneumococcus negative. Patient treated with  antibiotics bronchodilators.  Blood culture no growth, respiratory culture with rare normal respiratory flora. Overall patient is clinically improved and is being discharged home She is eager to go home today.  Son updated on the phone and agreeable  Discharge Diagnoses:    Acute respiratory distress with acute hypoxic respiratory failure Community-acquired pneumonia Acute COPD exacerbation due to parainfluenza virus 3 Acute viral illness/SIRS: Presenting with 1-1/2-week of productive cough sore throat shortness of breath wheezing chest tightness, and hypoxic Treating for hypoxic failure in the setting of parainfluenza virus 3, pneumonia, COPD exacerbation. Blood culture NGTD, sputum culture normal respiratory flora, urine strep/Legionella negative  CT chest without contrast with concerns for infiltrate/pneumonia.   Lower extremity Dopplers negative. Treated with  IV ceftriaxone  x 5 days, azithromycin , Claritin  Flonase ,Xopenex  and Atrovent  nebs, Brovana , Pulmicort , oral steroid Check for home oxygen setting and plan for dc home and not needing o2 now. Will do 2 more days of Duricef for discharge.   CAD-ischemic cardiomyopathy  s/p AICD Chronic systolic CHF Elevated troponin due to demand ischemia: Remains euvolemic, EKG nonischemic with flat troponin. proBNP chronically elevated stable.  TTE 10/25-EF of 25-30%, wall motion abnormalities.  Continue Lopressor , Imdur , Crestor , hydralazine , Lasix , aspirin .   GERD: On PPI.   Hypertension: Controlled , Cont  home metoprolol , hydralazine , Imdur .    AKI on CKD stage 3b: Stable at 1.7 -1.8 range.  Creatinine peaked to 2.2, monitor intermittently by pcp   Hypokalemia: Resolved   Anemia of chronic disease: Hb stable at 9 to 10 g   HPI   Prior  to Admission medications  Medication Sig Start Date End Date Taking? Authorizing Provider  acetaminophen  (TYLENOL ) 500 MG tablet Take 2 tablets (1,000 mg total) by mouth every 6 (six) hours as  needed (back pain.). Patient taking differently: Take 1,000 mg by mouth daily as needed for mild pain (pain score 1-3) or moderate pain (pain score 4-6). 08/11/22  Yes Redwine, Madison A, PA-C  amitriptyline  (ELAVIL ) 50 MG tablet TAKE 1 TABLET (50 MG TOTAL) BY MOUTH AT BEDTIME. 08/08/24  Yes Gwenda Heiner, Emil Schanz, MD  aspirin  81 MG EC tablet Take 81 mg by mouth at bedtime.   Yes [provider]  Dextromethorphan -guaiFENesin  (ROBAFEN DM) 20-200 MG/20ML LIQD Take 5 mLs by mouth every 4 (four) hours as needed (chest congestion). 08/05/24  Yes Christobal Guadalajara, MD  fluticasone  (FLONASE ) 50 MCG/ACT nasal spray Place 2 sprays into both nostrils daily. Patient taking differently: Place 1 spray into both nostrils in the morning and at bedtime. 06/30/24  Yes Ordell Prichett, Emil Schanz, MD  furosemide  (LASIX ) 40 MG tablet Take 1 tablet (40 mg total) by mouth daily. 12/09/21  Yes Weaver, Scott T, PA-C  hydrALAZINE  (APRESOLINE ) 10 MG tablet TAKE 1 TABLET BY MOUTH 3 TIMES DAILY. KEEP UPCOMING APPOINTMENT FOR REFILLS 04/19/24  Yes Wonda Sharper, MD  isosorbide  mononitrate (IMDUR ) 30 MG 24 hr tablet Take 1 tablet (30 mg total) by mouth daily. 06/17/24  Yes Weaver, Scott T, PA-C  loratadine  (CLARITIN ) 10 MG tablet Take 1 tablet (10 mg total) by mouth daily. 08/05/24 08/15/24 Yes Christobal Guadalajara, MD  methocarbamol  (ROBAXIN ) 500 MG tablet Take 1 tablet (500 mg total) by mouth every 6 (six) hours as needed for muscle spasms. 08/05/24 09/04/24 Yes Christobal Guadalajara, MD  metoprolol  tartrate (LOPRESSOR ) 25 MG tablet Take 2 tablets (50 mg total) by mouth 2 (two) times daily. 05/25/24 08/15/24 Yes Weaver, Scott T, PA-C  nitroGLYCERIN  (NITROSTAT ) 0.4 MG SL tablet Place 1 tablet (0.4 mg total) under the tongue every 5 (five) minutes as needed for chest pain. 05/13/24  Yes Cindy Garnette POUR, MD  polyethylene glycol powder (GLYCOLAX /MIRALAX ) powder Take 17 g by mouth daily as needed for mild constipation. 07/08/17  Yes Melonie Colonel, Mikel HERO, MD   rosuvastatin  (CRESTOR ) 20 MG tablet TAKE 1 TABLET (20 MG TOTAL) BY MOUTH AT BEDTIME. KEEP UPCOMING APPOINTMENT FOR REFILLS 06/17/24  Yes Wonda Sharper, MD  STIOLTO RESPIMAT  2.5-2.5 MCG/ACT AERS INHALE 2 PUFFS INTO THE LUNGS DAILY. NEED APPOINTMENT FOR FURTHER REFILLS Patient taking differently: Inhale 1 puff into the lungs daily as needed (wheezing/SOB). 02/13/23  Yes Hunsucker, Donnice SAUNDERS, MD  topiramate  (TOPAMAX ) 50 MG tablet TAKE 1 TABLET (50 MG TOTAL) BY MOUTH DAILY. Patient taking differently: Take 50 mg by mouth at bedtime. 11/17/23  Yes Danitra Payano, Emil Schanz, MD  traMADol  (ULTRAM ) 50 MG tablet Take 1 tablet (50 mg total) by mouth every 12 (twelve) hours as needed. Can use up to twice daily if needed; do not exceed 30 tabs in 1 month Patient taking differently: Take 50 mg by mouth at bedtime as needed for moderate pain (pain score 4-6) or severe pain (pain score 7-10). 03/16/24  Yes [name], DO    Allergies[1]  Patient Active Problem List   Diagnosis Date Noted   Infection due to parainfluenza virus 3 08/02/2024   Anemia 08/02/2024   Acute respiratory failure with hypoxia (HCC) 08/01/2024   CAP (community acquired pneumonia) 08/01/2024   COPD with acute exacerbation (HCC) 08/01/2024   Hypokalemia 08/01/2024   SIRS (systemic inflammatory  response syndrome) (HCC) 08/01/2024   Presence of heart assist device (HCC) 06/30/2024   Diabetes mellitus due to underlying condition with stage 4 chronic kidney disease, without long-term current use of insulin (HCC) 06/30/2024   Acute bacterial sinusitis 06/30/2024   Sinus congestion 06/30/2024   History of myocardial infarction 05/26/2024   History of coronary artery stent placement 05/12/2024   Hypertensive heart and kidney disease with HF and with CKD stage IV (HCC) 05/12/2024   Mixed hyperlipidemia 05/12/2024   Chronic heart failure with reduced ejection fraction (HFrEF, <= 40%) (HCC) 05/12/2024   Atherosclerotic heart disease  05/12/2024   S/P cervical spinal fusion 03/16/2024   Cervicalgia 03/16/2024   Chronic pain syndrome 05/20/2023   Encounter for long-term opiate analgesic use 05/20/2023   Constipation 05/12/2023   Family history of colon cancer in mother 05/12/2023   Bilateral hip pain 04/16/2023   Stage 4 chronic kidney disease (HCC) 11/25/2022   Chronic bilateral low back pain without sciatica 05/08/2022   Spinal stenosis of lumbar region without neurogenic claudication 05/08/2022   HFrEF (heart failure with reduced ejection fraction) (HCC) 12/02/2021   Stage 3b chronic kidney disease (HCC) 08/08/2021   Chronic insomnia 11/14/2019   Colon polyp 06/16/2018   Thoracic aortic aneurysm without rupture 03/31/2018   Chronic renal insufficiency    AICD (automatic cardioverter/defibrillator) present    Ischemic cardiomyopathy    COPD (chronic obstructive pulmonary disease) (HCC)    Dyslipidemia    Insomnia    Pre-diabetes 06/19/2017   Gastric polyp    Osteoporosis 05/11/2013   Hearing loss 04/29/2013   Mild cognitive impairment 04/29/2013   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/05/2012   Implantable cardioverter-defibrillator (ICD) in situ 08/06/2011   DDD (degenerative disc disease), lumbar 05/16/2011   History of pulmonary embolus (PE) 01/02/2011   UNSPECIFIED ANEMIA 07/03/2010   Essential hypertension 09/05/2009   DIVERTICULOSIS-COLON 08/14/2009   History of colonic polyps 04/24/2009   TOBACCO ABUSE 04/04/2009   Coronary artery disease involving native coronary artery of native heart with angina pectoris 02/25/2009   GERD 10/19/2007   IBS 10/19/2007   COLONIC POLYPS, ADENOMATOUS 05/12/2007    Past Medical History:  Diagnosis Date   Abdominal pain, left lower quadrant 08/14/2009   Qualifier: Diagnosis of  By: Drucilla BANANA, Pam     Abnormal CT scan, stomach    Abnormal LFTs 06/19/2017   AICD (automatic cardioverter/defibrillator) present    Dr.Allred follows   Aortic arch pseudoaneurysm    a.  followed by Dr. Fleeta Ochoa.   Arthritis    Asthma    Benign neoplasm of colon    CAD (coronary artery disease) 02/2009   a. anterior STEMI rx with BMS to prox LAD in 02/2009. b. ISR s/p PTCA 06/2009. c. ISR s/p thrombectomy & PTCA 03/2010 due to late stent thrombosis. // Myoview  04/2019: EF 28, ant, ant-sept, inf-sept scar, no ischemia, high risk (stable>>cont med Rx)    Cardiomyopathy, ischemic 06/12/2011   Chronic renal insufficiency    stage 3   Chronic systolic CHF (congestive heart failure) (HCC)    a. s/p ST. Jude ICD Sep 03, 2011.   Chronic systolic heart failure (HCC) 08/05/2011   Colon polyp    Complication of anesthesia    COPD (chronic obstructive pulmonary disease) (HCC)    CORONARY ARTERY DISEASE 04/24/2009   Qualifier: Diagnosis of  By: Ever Riggers, Amy S    DDD (degenerative disc disease), lumbar 05/16/2011   Depression    since my son died in 2024/10/31 09/02/17)  Diverticulosis    DIVERTICULOSIS-COLON 08/14/2009   Qualifier: Diagnosis of  By: Ever PA-c, Amy S    DYSPHAGIA UNSPECIFIED 04/24/2009   Qualifier: Diagnosis of  By: Drucilla BANANA, Pam     Dyspnea    when I lay down at night    Essential hypertension 09/05/2009   Qualifier: Diagnosis of  By: Thalia CMA, Jennifer     Gastric polyp    GERD 10/19/2007   Qualifier: Diagnosis of  By: Kimble RN, [place]: Diagnosis of  By: Drucilla BANANA, Pam     GERD (gastroesophageal reflux disease)    GI bleed    a. h/o GIB on DAPT, now on ASA only.   Headache 01/03/2013   Hearing loss    left ear   History of pulmonary embolus (PE) 01/02/2011   Hyperlipemia    Hyperlipidemia, unspecified 04/24/2009   Qualifier: Diagnosis of  By: Ever PA-c, Amy S    Hypertension    ICD-St.Jude 08/06/2011   S/p St. Jude ICD placement 08/05/11    Insomnia    Irritable bowel syndrome    Ischemic cardiomyopathy    EF 10-15%   Memory loss    Migraine headache without aura    Myocardial infarct (HCC) 2011 x 2   Dr. Cooper,cardiology     PERSONAL HX COLONIC POLYPS 04/24/2009   Qualifier: Diagnosis of  By: Ever Riggers, Amy S November, 2011 colonoscopy demonstrated a sessile cecal polyp and sigmoid polyp    Pneumonia    PONV (postoperative nausea and vomiting)    Pre-diabetes    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/05/2012   Pulmonary embolism (HCC) 2011   Stroke [facility])    'When I was young. no residual   Thoracic aortic aneurysm 03/31/2018   TOBACCO ABUSE 04/04/2009   Qualifier: Diagnosis of  By: Parthenia RIGGERS Olivia Mason  Qualifier: Diagnosis of  By: Morris, MD, CODY Debby Lenis    Transfusion history    ?'12 or '13   UNSPECIFIED ANEMIA 07/03/2010   Qualifier: Diagnosis of  By: Arlyss Math     Unspecified mastoiditis     Past Surgical History:  Procedure Laterality Date   ANGIOPLASTY  07/02/09, 04/01/10   BILATERAL SALPINGOOPHORECTOMY     CAD( bare metal stent)  02/2009   x 1   CAROTID-SUBCLAVIAN BYPASS GRAFT Left 03/31/2018   Procedure: LEFT SUBCLAVIAN ARTERY BYPASS GRAFT;  Surgeon: Serene Gaile ORN, MD;  Location[facility] OR;  Service: Vascular;  Laterality: Left;   CERVICAL SPINE SURGERY  08/08   COLONOSCOPY WITH PROPOFOL  N/A 04/29/2016   Procedure: COLONOSCOPY WITH PROPOFOL ;  Surgeon: Elspeth Deward Naval, MD;  Location: [facility] ENDOSCOPY;  Service: Gastroenterology;  Laterality: N/A;   ESOPHAGOGASTRODUODENOSCOPY N/A 12/09/2016   Procedure: ESOPHAGOGASTRODUODENOSCOPY (EGD);  Surgeon: Naval Elspeth Deward, MD;  Location: [facility];  Service: Gastroenterology;  Laterality: N/A;   ESOPHAGOGASTRODUODENOSCOPY (EGD) WITH PROPOFOL  N/A 04/29/2016   Procedure: ESOPHAGOGASTRODUODENOSCOPY (EGD) WITH PROPOFOL ;  Surgeon: Elspeth Deward Naval, MD;  Location: [facility];  Service: Gastroenterology;  Laterality: N/A;   EUS N/A 05/15/2016   Procedure: UPPER ENDOSCOPIC ULTRASOUND (EUS) RADIAL;  Surgeon: Toribio SHAUNNA Cedar, MD;  Location: [facility] ENDOSCOPY;  Service: Endoscopy;  Laterality: N/A;   ICD GENERATOR CHANGEOUT N/A 11/04/2022    Procedure: ICD GENERATOR CHANGEOUT;  Surgeon: Nancey, Eulas BRAVO, MD;  Location: [facility] INVASIVE CV LAB;  Service: Cardiovascular;  Laterality: N/A;   IMPLANTABLE CARDIOVERTER DEFIBRILLATOR IMPLANT N/A 08/05/2011   Primary prevention SJM ICD implanted,  Analyze ST study patient  INNER EAR SURGERY     left x 17   LUMBAR DISC SURGERY  02/2008   fusion   NASAL SEPTUM SURGERY     RIGHT HEART CATH N/A 12/11/2021   Procedure: RIGHT HEART CATH;  Surgeon: Wonda Sharper, MD;  Location: [facility] INVASIVE CV LAB;  Service: Cardiovascular;  Laterality: N/A;   THORACIC AORTIC ENDOVASCULAR STENT GRAFT N/A 03/31/2018   Procedure: THORACIC AORTIC ENDOVASCULAR STENT GRAFT;  Surgeon: Serene Gaile ORN, MD;  Location: [facility] OR;  Service: Vascular;  Laterality: N/A;   TOTAL ABDOMINAL HYSTERECTOMY     complete    Social History   Socioeconomic History   Marital status: Divorced    Spouse name: Not on file   Number of children: 2   Years of education: 11   Highest education level: 12th grade  Occupational History   Occupation: Designer, Industrial/product: UNEMPLOYED    Employer: DISABLED  Tobacco Use   Smoking status: Former    Current packs/day: 0.13    Average packs/day: 0.1 packs/day for 30.0 years (3.9 ttl pk-yrs)    Types: Cigarettes   Smokeless tobacco: Never  Vaping Use   Vaping status: Never Used  Substance and Sexual Activity   Alcohol use: No    Alcohol/week: 0.0 standard drinks of alcohol   Drug use: No   Sexual activity: Not Currently  Other Topics Concern   Not on file  Social History Narrative   Pt lives in [place] alone. 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).Patient has 11 th grade education.Right handed.Caffeine - one cup daily   Social Drivers of Health   Tobacco Use: Medium Risk (08/15/2024)   Patient History    Smoking Tobacco Use: Former    Smokeless Tobacco Use: Never    Passive Exposure: Not on file  Financial Resource Strain: Low Risk (02/23/2024)   Overall  Financial Resource Strain (CARDIA)    Difficulty of Paying Living Expenses: Not hard at all  Food Insecurity: No Food Insecurity (08/01/2024)   Epic    Worried About Programme Researcher, Broadcasting/film/video in the Last Year: Never true    Ran Out of Food in the Last Year: Never true  Transportation Needs: No Transportation Needs (08/01/2024)   Epic    Lack of Transportation (Medical): No    Lack of Transportation (Non-Medical): No  Physical Activity: Inactive (02/23/2024)   Exercise Vital Sign    Days of Exercise per Week: 0 days    Minutes of Exercise per Session: 0 min  Stress: No Stress Concern Present (02/23/2024)   [employer] of Occupational Health - Occupational Stress Questionnaire    Feeling of Stress: Not at all  Social Connections: Moderately Integrated (08/01/2024)   Social Connection and Isolation Panel    Frequency of Communication with Friends and Family: More than three times a week    Frequency of Social Gatherings with Friends and Family: More than three times a week    Attends Religious Services: More than 4 times per year    Active Member of [employer] or Organizations: Yes    Attends Banker Meetings: More than 4 times per year    Marital Status: Widowed  Intimate Partner Violence: Not At Risk (08/01/2024)   Epic    Fear of Current or Ex-Partner: No    Emotionally Abused: No    Physically Abused: No    Sexually Abused: No  Depression (PHQ2-9): Low Risk (08/15/2024)   Depression (PHQ2-9)    PHQ-2  Score: 0  Alcohol Screen: Low Risk (02/23/2024)   Alcohol Screen    Last Alcohol Screening Score (AUDIT): 0  Housing: Low Risk (08/01/2024)   Epic    Unable to Pay for Housing in the Last Year: No    Number of Times Moved in the Last Year: 0    Homeless in the Last Year: No  Utilities: Not At Risk (08/01/2024)   Epic    Threatened with loss of utilities: No  Health Literacy: Inadequate Health Literacy (02/23/2024)   B1300 Health Literacy    Frequency of need for help with medical  instructions: Often    Family History  Problem Relation Age of Onset   Colon cancer Mother    Other Brother        tumor in his brain   Bladder Cancer Brother    Throat cancer Brother    Cancer Son        kidney, spread to his liver   Breast cancer Cousin      Review of Systems  Constitutional: Negative.  Negative for chills and fever.  HENT: Negative.  Negative for congestion and sore throat.   Respiratory: Negative.  Negative for cough and shortness of breath.   Cardiovascular: Negative.  Negative for chest pain and palpitations.  Gastrointestinal:  Positive for constipation. Negative for abdominal pain, diarrhea, nausea and vomiting.  Genitourinary: Negative.  Negative for dysuria and hematuria.  Skin: Negative.  Negative for rash.  Neurological: Negative.  Negative for dizziness and headaches.  All other systems reviewed and are negative.   Vitals:   08/15/24 1412  BP: 118/82  Pulse: 93  Temp: 97.9 F (36.6 C)  SpO2: 95%    Physical Exam Vitals reviewed.  Constitutional:      Appearance: Normal appearance.  HENT:     Head: Normocephalic.     Mouth/Throat:     Mouth: Mucous membranes are moist.     Pharynx: Oropharynx is clear.  Eyes:     Extraocular Movements: Extraocular movements intact.     Conjunctiva/sclera: Conjunctivae normal.  Cardiovascular:     Rate and Rhythm: Normal rate and regular rhythm.     Pulses: Normal pulses.     Heart sounds: Normal heart sounds.  Pulmonary:     Effort: Pulmonary effort is normal.     Breath sounds: Normal breath sounds.  Abdominal:     Palpations: Abdomen is soft.     Tenderness: There is no abdominal tenderness.  Musculoskeletal:     Cervical back: No tenderness.  Lymphadenopathy:     Cervical: No cervical adenopathy.  Skin:    General: Skin is warm and dry.  Neurological:     General: No focal deficit present.     Mental Status: She is alert and oriented to person, place, and time.  Psychiatric:         Mood and Affect: Mood normal.        Behavior: Behavior normal.      ASSESSMENT & PLAN: A total of 44 minutes was spent with the patient and counseling/coordination of care regarding preparing for this visit, review of most recent office visit notes, review of most recent hospital discharge summary, review of multiple chronic medical conditions and their management, review of all medications, review of most recent bloodwork results, review of health maintenance items, education on nutrition, prognosis, documentation, and need for follow up.   Problem List Items Addressed This Visit       Cardiovascular and Mediastinum  Coronary artery disease involving native coronary artery of native heart with angina pectoris (Chronic)   Stable.  No anginal episodes.      Chronic heart failure with reduced ejection fraction (HFrEF, <= 40%) (HCC)   Clinically euvolemic No findings of acute CHF        Respiratory   CAP (community acquired pneumonia) - Primary   Much improved. Asymptomatic today No concerns identified      Relevant Orders   Comprehensive metabolic panel with GFR   CBC with Differential/Platelet   COPD with acute exacerbation (HCC)   Much improved today.  Normal pulmonary examination In no distress No concerns identified today      Relevant Orders   Comprehensive metabolic panel with GFR   CBC with Differential/Platelet     Genitourinary   Stage 3b chronic kidney disease (HCC)   Advised to stay well-hydrated and avoid NSAIDs       Other Visit Diagnoses       Hospital discharge follow-up            Patient Instructions  Health Maintenance After Age 80 After age 47, you are at a higher risk for certain long-term diseases and infections as well as injuries from falls. Falls are a major cause of broken bones and head injuries in people who are older than age 59. Getting regular preventive care can help to keep you healthy and well. Preventive care includes  getting regular testing and making lifestyle changes as recommended by your health care provider. Talk with your health care provider about: Which screenings and tests you should have. A screening is a test that checks for a disease when you have no symptoms. A diet and exercise plan that is right for you. What should I know about screenings and tests to prevent falls? Screening and testing are the best ways to find a health problem early. Early diagnosis and treatment give you the best chance of managing medical conditions that are common after age 34. Certain conditions and lifestyle choices may make you more likely to have a fall. Your health care provider may recommend: Regular vision checks. Poor vision and conditions such as cataracts can make you more likely to have a fall. If you wear glasses, make sure to get your prescription updated if your vision changes. Medicine review. Work with your health care provider to regularly review all of the medicines you are taking, including over-the-counter medicines. Ask your health care provider about any side effects that may make you more likely to have a fall. Tell your health care provider if any medicines that you take make you feel dizzy or sleepy. Strength and balance checks. Your health care provider may recommend certain tests to check your strength and balance while standing, walking, or changing positions. Foot health exam. Foot pain and numbness, as well as not wearing proper footwear, can make you more likely to have a fall. Screenings, including: Osteoporosis screening. Osteoporosis is a condition that causes the bones to get weaker and break more easily. Blood pressure screening. Blood pressure changes and medicines to control blood pressure can make you feel dizzy. Depression screening. You may be more likely to have a fall if you have a fear of falling, feel depressed, or feel unable to do activities that you used to do. Alcohol use  screening. Using too much alcohol can affect your balance and may make you more likely to have a fall. Follow these instructions at home: Lifestyle Do not drink alcohol  if: Your health care provider tells you not to drink. If you drink alcohol: Limit how much you have to: 0-1 drink a day for women. 0-2 drinks a day for men. Know how much alcohol is in your drink. In the [place] one drink equals one 12 oz bottle of beer (355 mL), one 5 oz glass of wine (148 mL), or one 1 oz glass of hard liquor (44 mL). Do not use any products that contain nicotine or tobacco. These products include cigarettes, chewing tobacco, and vaping devices, such as e-cigarettes. If you need help quitting, ask your health care provider. Activity  Follow a regular exercise program to stay fit. This will help you maintain your balance. Ask your health care provider what types of exercise are appropriate for you. If you need a cane or walker, use it as recommended by your health care provider. Wear supportive shoes that have nonskid soles. Safety  Remove any tripping hazards, such as rugs, cords, and clutter. Install safety equipment such as grab bars in bathrooms and safety rails on stairs. Keep rooms and walkways well-lit. General instructions Talk with your health care provider about your risks for falling. Tell your health care provider if: You fall. Be sure to tell your health care provider about all falls, even ones that seem minor. You feel dizzy, tiredness (fatigue), or off-balance. Take over-the-counter and prescription medicines only as told by your health care provider. These include supplements. Eat a healthy diet and maintain a healthy weight. A healthy diet includes low-fat dairy products, low-fat (lean) meats, and fiber from whole grains, beans, and lots of fruits and vegetables. Stay current with your vaccines. Schedule regular health, dental, and eye exams. Summary Having a healthy lifestyle and  getting preventive care can help to protect your health and wellness after age 4. Screening and testing are the best way to find a health problem early and help you avoid having a fall. Early diagnosis and treatment give you the best chance for managing medical conditions that are more common for people who are older than age 54. Falls are a major cause of broken bones and head injuries in people who are older than age 53. Take precautions to prevent a fall at home. Work with your health care provider to learn what changes you can make to improve your health and wellness and to prevent falls. This information is not intended to replace advice given to you by your health care provider. Make sure you discuss any questions you have with your health care provider. Document Revised: 12/03/2020 Document Reviewed: 12/03/2020 [employer] Patient Education  2024 [employer]    Emil Schaumann, MD  [facility]    [1]  Allergies Allergen Reactions   Sulfa Antibiotics Other (See Comments)    Unknown, childhood allergy    Sulfonamide Derivatives Other (See Comments)    UNSURE   Zestril  [Lisinopril ] Cough

## 2024-08-16 ENCOUNTER — Ambulatory Visit: Payer: Self-pay | Admitting: Emergency Medicine

## 2024-08-18 NOTE — Telephone Encounter (Signed)
 Please advise.

## 2024-08-19 NOTE — Telephone Encounter (Signed)
 She just had an infection, community-acquired pneumonia.  CBC numbers are not going to completely normalize for a while.  They are stable and raise no concerns at present time.  She also had a pretty intense systemic inflammatory reaction which is going to make all these numbers abnormal.  Again they take time to come back down to her baseline numbers.  Thanks.

## 2024-08-31 NOTE — Telephone Encounter (Unsigned)
 Copied from CRM [id number]. Topic: Referral - Question >> Aug 31, 2024 10:24 AM Deleta RAMAN wrote: Reason for CRM: Patient was sent a tub transfer bench from her referral. Patient physical therapist would like to know where the tub transferred bench was sent from due to patient requesting a shower chair instead of the tub transfer bench because the tub transfer is too big and no usage of it. Please contact patient or kelly at [phone]

## 2024-09-02 NOTE — Telephone Encounter (Signed)
 I have sent a message to [employer] to see about how we can go about of returning the bench tub and getting the shower chair instead

## 2024-09-12 ENCOUNTER — Ambulatory Visit: Admitting: Physical Medicine and Rehabilitation

## 2024-11-14 ENCOUNTER — Ambulatory Visit: Admitting: Emergency Medicine

## 2025-02-23 ENCOUNTER — Ambulatory Visit
